# Patient Record
Sex: Female | Born: 1935 | Race: Black or African American | Hispanic: No | Smoking: Never smoker
Health system: Southern US, Community
[De-identification: ages and names within clinical notes are randomized; demographics above are authoritative.]

## PROBLEM LIST (undated history)

## (undated) DIAGNOSIS — R7309 Other abnormal glucose: Secondary | ICD-10-CM

## (undated) DIAGNOSIS — J449 Chronic obstructive pulmonary disease, unspecified: Secondary | ICD-10-CM

## (undated) DIAGNOSIS — J4489 Other specified chronic obstructive pulmonary disease: Secondary | ICD-10-CM

## (undated) DIAGNOSIS — D649 Anemia, unspecified: Secondary | ICD-10-CM

## (undated) DIAGNOSIS — F22 Delusional disorders: Secondary | ICD-10-CM

## (undated) DIAGNOSIS — J45909 Unspecified asthma, uncomplicated: Secondary | ICD-10-CM

## (undated) DIAGNOSIS — E559 Vitamin D deficiency, unspecified: Secondary | ICD-10-CM

## (undated) DIAGNOSIS — K219 Gastro-esophageal reflux disease without esophagitis: Secondary | ICD-10-CM

## (undated) DIAGNOSIS — I5020 Unspecified systolic (congestive) heart failure: Secondary | ICD-10-CM

## (undated) DIAGNOSIS — M545 Low back pain, unspecified: Secondary | ICD-10-CM

## (undated) DIAGNOSIS — I10 Essential (primary) hypertension: Secondary | ICD-10-CM

## (undated) DIAGNOSIS — R609 Edema, unspecified: Secondary | ICD-10-CM

## (undated) DIAGNOSIS — I872 Venous insufficiency (chronic) (peripheral): Secondary | ICD-10-CM

## (undated) DIAGNOSIS — F489 Nonpsychotic mental disorder, unspecified: Secondary | ICD-10-CM

## (undated) DIAGNOSIS — F329 Major depressive disorder, single episode, unspecified: Secondary | ICD-10-CM

## (undated) DIAGNOSIS — M1990 Unspecified osteoarthritis, unspecified site: Secondary | ICD-10-CM

## (undated) DIAGNOSIS — I890 Lymphedema, not elsewhere classified: Secondary | ICD-10-CM

## (undated) DIAGNOSIS — F419 Anxiety disorder, unspecified: Secondary | ICD-10-CM

## (undated) DIAGNOSIS — F32A Depression, unspecified: Secondary | ICD-10-CM

## (undated) HISTORY — DX: Chronic obstructive pulmonary disease, unspecified: J44.9

## (undated) HISTORY — DX: Unspecified systolic (congestive) heart failure: I50.20

## (undated) HISTORY — DX: Essential (primary) hypertension: I10

## (undated) HISTORY — DX: Unspecified osteoarthritis, unspecified site: M19.90

## (undated) HISTORY — DX: Unspecified asthma, uncomplicated: J45.909

## (undated) HISTORY — DX: Major depressive disorder, single episode, unspecified: F32.9

## (undated) HISTORY — PX: LUMBAR DISC SURGERY: SHX700

## (undated) HISTORY — DX: Delusional disorders: F22

## (undated) HISTORY — DX: Lymphedema, not elsewhere classified: I89.0

## (undated) HISTORY — DX: Depression, unspecified: F32.A

## (undated) HISTORY — DX: Other abnormal glucose: R73.09

## (undated) HISTORY — PX: CHOLECYSTECTOMY: SHX55

## (undated) HISTORY — DX: Venous insufficiency (chronic) (peripheral): I87.2

## (undated) HISTORY — PX: PARTIAL HYSTERECTOMY: SHX80

## (undated) HISTORY — DX: Anemia, unspecified: D64.9

## (undated) HISTORY — DX: Low back pain: M54.5

## (undated) HISTORY — DX: Nonpsychotic mental disorder, unspecified: F48.9

## (undated) HISTORY — PX: TONSILLECTOMY: SUR1361

## (undated) HISTORY — PX: APPENDECTOMY: SHX54

## (undated) HISTORY — DX: Gastro-esophageal reflux disease without esophagitis: K21.9

## (undated) HISTORY — DX: Low back pain, unspecified: M54.50

## (undated) HISTORY — DX: Other specified chronic obstructive pulmonary disease: J44.89

## (undated) HISTORY — DX: Vitamin D deficiency, unspecified: E55.9

## (undated) HISTORY — DX: Anxiety disorder, unspecified: F41.9

## (undated) HISTORY — PX: CERVICAL SPINE SURGERY: SHX589

## (undated) HISTORY — DX: Edema, unspecified: R60.9

## (undated) HISTORY — PX: HAND SURGERY: SHX662

---

## 1998-08-28 ENCOUNTER — Encounter: Payer: Self-pay | Admitting: *Deleted

## 1998-08-28 ENCOUNTER — Emergency Department (HOSPITAL_COMMUNITY): Admission: EM | Admit: 1998-08-28 | Discharge: 1998-08-28 | Payer: Self-pay | Admitting: Emergency Medicine

## 1999-06-21 ENCOUNTER — Encounter: Payer: Self-pay | Admitting: Emergency Medicine

## 1999-06-21 ENCOUNTER — Emergency Department (HOSPITAL_COMMUNITY): Admission: EM | Admit: 1999-06-21 | Discharge: 1999-06-21 | Payer: Self-pay | Admitting: Emergency Medicine

## 2001-02-05 ENCOUNTER — Ambulatory Visit (HOSPITAL_COMMUNITY): Admission: RE | Admit: 2001-02-05 | Discharge: 2001-02-05 | Payer: Self-pay | Admitting: Pulmonary Disease

## 2001-02-05 ENCOUNTER — Encounter: Payer: Self-pay | Admitting: Pulmonary Disease

## 2001-02-07 ENCOUNTER — Ambulatory Visit (HOSPITAL_COMMUNITY): Admission: RE | Admit: 2001-02-07 | Discharge: 2001-02-07 | Payer: Self-pay | Admitting: Pulmonary Disease

## 2001-02-07 ENCOUNTER — Encounter: Payer: Self-pay | Admitting: Pulmonary Disease

## 2003-03-13 ENCOUNTER — Ambulatory Visit (HOSPITAL_COMMUNITY): Admission: RE | Admit: 2003-03-13 | Discharge: 2003-03-13 | Payer: Self-pay | Admitting: Family Medicine

## 2003-03-13 ENCOUNTER — Encounter: Payer: Self-pay | Admitting: Family Medicine

## 2003-06-24 ENCOUNTER — Encounter (HOSPITAL_BASED_OUTPATIENT_CLINIC_OR_DEPARTMENT_OTHER): Payer: Self-pay | Admitting: General Surgery

## 2003-06-25 ENCOUNTER — Encounter (HOSPITAL_BASED_OUTPATIENT_CLINIC_OR_DEPARTMENT_OTHER): Payer: Self-pay | Admitting: General Surgery

## 2003-06-25 ENCOUNTER — Encounter (INDEPENDENT_AMBULATORY_CARE_PROVIDER_SITE_OTHER): Payer: Self-pay | Admitting: Specialist

## 2003-06-25 ENCOUNTER — Ambulatory Visit (HOSPITAL_COMMUNITY): Admission: RE | Admit: 2003-06-25 | Discharge: 2003-06-27 | Payer: Self-pay | Admitting: General Surgery

## 2003-10-31 DIAGNOSIS — R609 Edema, unspecified: Secondary | ICD-10-CM

## 2003-10-31 HISTORY — DX: Edema, unspecified: R60.9

## 2005-12-25 ENCOUNTER — Ambulatory Visit: Payer: Self-pay | Admitting: Pulmonary Disease

## 2007-01-15 ENCOUNTER — Ambulatory Visit: Payer: Self-pay | Admitting: Pulmonary Disease

## 2007-01-15 LAB — CONVERTED CEMR LAB
ALT: 30 units/L (ref 0–40)
AST: 33 units/L (ref 0–37)
Albumin: 3.8 g/dL (ref 3.5–5.2)
Alkaline Phosphatase: 86 units/L (ref 39–117)
BUN: 14 mg/dL (ref 6–23)
Basophils Absolute: 0 10*3/uL (ref 0.0–0.1)
Calcium: 9.6 mg/dL (ref 8.4–10.5)
Chloride: 102 meq/L (ref 96–112)
Creatinine, Ser: 1.1 mg/dL (ref 0.4–1.2)
Eosinophils Absolute: 0.4 10*3/uL (ref 0.0–0.6)
GFR calc non Af Amer: 52 mL/min
HCT: 35.7 % — ABNORMAL LOW (ref 36.0–46.0)
MCHC: 33.7 g/dL (ref 30.0–36.0)
Monocytes Relative: 10.3 % (ref 3.0–11.0)
Neutrophils Relative %: 50.3 % (ref 43.0–77.0)
Platelets: 272 10*3/uL (ref 150–400)
RBC: 4.26 M/uL (ref 3.87–5.11)
RDW: 13.9 % (ref 11.5–14.6)
Total Bilirubin: 0.5 mg/dL (ref 0.3–1.2)

## 2007-11-01 ENCOUNTER — Telehealth (INDEPENDENT_AMBULATORY_CARE_PROVIDER_SITE_OTHER): Payer: Self-pay | Admitting: *Deleted

## 2007-11-05 ENCOUNTER — Encounter: Payer: Self-pay | Admitting: Pulmonary Disease

## 2007-11-11 DIAGNOSIS — M545 Low back pain, unspecified: Secondary | ICD-10-CM | POA: Insufficient documentation

## 2007-11-11 DIAGNOSIS — N200 Calculus of kidney: Secondary | ICD-10-CM | POA: Insufficient documentation

## 2007-11-11 DIAGNOSIS — I10 Essential (primary) hypertension: Secondary | ICD-10-CM | POA: Insufficient documentation

## 2007-11-11 DIAGNOSIS — I872 Venous insufficiency (chronic) (peripheral): Secondary | ICD-10-CM | POA: Insufficient documentation

## 2007-11-11 HISTORY — DX: Calculus of kidney: N20.0

## 2007-11-21 ENCOUNTER — Ambulatory Visit: Payer: Self-pay | Admitting: Pulmonary Disease

## 2007-12-16 ENCOUNTER — Encounter: Payer: Self-pay | Admitting: Pulmonary Disease

## 2008-04-20 ENCOUNTER — Encounter: Payer: Self-pay | Admitting: Pulmonary Disease

## 2008-05-04 ENCOUNTER — Encounter: Payer: Self-pay | Admitting: Pulmonary Disease

## 2008-06-24 ENCOUNTER — Encounter: Payer: Self-pay | Admitting: Pulmonary Disease

## 2008-07-27 ENCOUNTER — Telehealth: Payer: Self-pay | Admitting: Pulmonary Disease

## 2008-07-29 ENCOUNTER — Ambulatory Visit: Payer: Self-pay | Admitting: Pulmonary Disease

## 2008-07-30 ENCOUNTER — Telehealth (INDEPENDENT_AMBULATORY_CARE_PROVIDER_SITE_OTHER): Payer: Self-pay | Admitting: *Deleted

## 2008-07-30 LAB — CONVERTED CEMR LAB
ALT: 27 units/L (ref 0–35)
Basophils Absolute: 0 10*3/uL (ref 0.0–0.1)
Basophils Relative: 0.2 % (ref 0.0–3.0)
Bilirubin, Direct: 0.1 mg/dL (ref 0.0–0.3)
CO2: 29 meq/L (ref 19–32)
Calcium: 9.8 mg/dL (ref 8.4–10.5)
Creatinine, Ser: 1.2 mg/dL (ref 0.4–1.2)
GFR calc Af Amer: 56 mL/min
Glucose, Bld: 88 mg/dL (ref 70–99)
HCT: 34 % — ABNORMAL LOW (ref 36.0–46.0)
Hemoglobin: 11.5 g/dL — ABNORMAL LOW (ref 12.0–15.0)
Hgb A1c MFr Bld: 5.8 % (ref 4.6–6.0)
Lymphocytes Relative: 33.5 % (ref 12.0–46.0)
MCHC: 33.8 g/dL (ref 30.0–36.0)
Monocytes Absolute: 0.5 10*3/uL (ref 0.1–1.0)
Monocytes Relative: 8.7 % (ref 3.0–12.0)
Neutro Abs: 3 10*3/uL (ref 1.4–7.7)
Pro B Natriuretic peptide (BNP): 123 pg/mL — ABNORMAL HIGH (ref 0.0–100.0)
RBC: 3.98 M/uL (ref 3.87–5.11)
RDW: 14.2 % (ref 11.5–14.6)
Sodium: 139 meq/L (ref 135–145)
TSH: 1.04 microintl units/mL (ref 0.35–5.50)
Total Protein: 8.1 g/dL (ref 6.0–8.3)

## 2008-08-02 DIAGNOSIS — J4489 Other specified chronic obstructive pulmonary disease: Secondary | ICD-10-CM | POA: Insufficient documentation

## 2008-08-02 DIAGNOSIS — J449 Chronic obstructive pulmonary disease, unspecified: Secondary | ICD-10-CM

## 2008-08-02 DIAGNOSIS — F22 Delusional disorders: Secondary | ICD-10-CM | POA: Insufficient documentation

## 2008-08-04 ENCOUNTER — Telehealth (INDEPENDENT_AMBULATORY_CARE_PROVIDER_SITE_OTHER): Payer: Self-pay | Admitting: *Deleted

## 2008-08-10 ENCOUNTER — Encounter: Payer: Self-pay | Admitting: Pulmonary Disease

## 2008-08-20 ENCOUNTER — Telehealth (INDEPENDENT_AMBULATORY_CARE_PROVIDER_SITE_OTHER): Payer: Self-pay | Admitting: *Deleted

## 2008-09-07 ENCOUNTER — Telehealth (INDEPENDENT_AMBULATORY_CARE_PROVIDER_SITE_OTHER): Payer: Self-pay | Admitting: *Deleted

## 2008-09-14 ENCOUNTER — Ambulatory Visit (HOSPITAL_COMMUNITY): Admission: RE | Admit: 2008-09-14 | Discharge: 2008-09-14 | Payer: Self-pay | Admitting: Pulmonary Disease

## 2008-09-14 ENCOUNTER — Encounter: Payer: Self-pay | Admitting: Pulmonary Disease

## 2008-09-14 ENCOUNTER — Ambulatory Visit: Payer: Self-pay | Admitting: *Deleted

## 2008-09-14 ENCOUNTER — Ambulatory Visit: Payer: Self-pay | Admitting: Pulmonary Disease

## 2008-09-16 ENCOUNTER — Encounter: Payer: Self-pay | Admitting: Pulmonary Disease

## 2008-10-28 ENCOUNTER — Encounter: Payer: Self-pay | Admitting: Pulmonary Disease

## 2009-01-01 ENCOUNTER — Ambulatory Visit: Payer: Self-pay | Admitting: Emergency Medicine

## 2009-01-01 ENCOUNTER — Inpatient Hospital Stay (HOSPITAL_COMMUNITY): Admission: EM | Admit: 2009-01-01 | Discharge: 2009-01-04 | Payer: Self-pay | Admitting: Emergency Medicine

## 2009-01-04 ENCOUNTER — Telehealth (INDEPENDENT_AMBULATORY_CARE_PROVIDER_SITE_OTHER): Payer: Self-pay | Admitting: *Deleted

## 2009-01-11 ENCOUNTER — Ambulatory Visit: Payer: Self-pay | Admitting: Pulmonary Disease

## 2009-01-11 DIAGNOSIS — R7309 Other abnormal glucose: Secondary | ICD-10-CM | POA: Insufficient documentation

## 2009-02-16 ENCOUNTER — Ambulatory Visit: Payer: Self-pay | Admitting: Pulmonary Disease

## 2009-02-18 ENCOUNTER — Telehealth (INDEPENDENT_AMBULATORY_CARE_PROVIDER_SITE_OTHER): Payer: Self-pay | Admitting: *Deleted

## 2009-03-16 ENCOUNTER — Ambulatory Visit: Payer: Self-pay | Admitting: Vascular Surgery

## 2009-09-24 ENCOUNTER — Telehealth (INDEPENDENT_AMBULATORY_CARE_PROVIDER_SITE_OTHER): Payer: Self-pay | Admitting: *Deleted

## 2009-09-30 ENCOUNTER — Ambulatory Visit: Payer: Self-pay | Admitting: Pulmonary Disease

## 2009-09-30 DIAGNOSIS — L259 Unspecified contact dermatitis, unspecified cause: Secondary | ICD-10-CM | POA: Insufficient documentation

## 2010-01-28 ENCOUNTER — Ambulatory Visit: Payer: Self-pay | Admitting: Pulmonary Disease

## 2010-01-29 DIAGNOSIS — E559 Vitamin D deficiency, unspecified: Secondary | ICD-10-CM | POA: Insufficient documentation

## 2010-01-29 DIAGNOSIS — M7020 Olecranon bursitis, unspecified elbow: Secondary | ICD-10-CM | POA: Insufficient documentation

## 2010-01-31 LAB — CONVERTED CEMR LAB
ALT: 21 units/L (ref 0–35)
AST: 33 units/L (ref 0–37)
Albumin: 3.6 g/dL (ref 3.5–5.2)
Alkaline Phosphatase: 86 units/L (ref 39–117)
Basophils Absolute: 0 10*3/uL (ref 0.0–0.1)
Bilirubin, Direct: 0.1 mg/dL (ref 0.0–0.3)
CO2: 24 meq/L (ref 19–32)
Chloride: 102 meq/L (ref 96–112)
Eosinophils Absolute: 0.3 10*3/uL (ref 0.0–0.7)
Folate: 17.7 ng/mL
Glucose, Bld: 92 mg/dL (ref 70–99)
HDL: 57.9 mg/dL (ref >39.00)
Hemoglobin: 11.2 g/dL — ABNORMAL LOW (ref 12.0–15.0)
Iron: 37 ug/dL — ABNORMAL LOW (ref 42–145)
Lymphocytes Relative: 25.3 % (ref 12.0–46.0)
MCHC: 33.1 g/dL (ref 30.0–36.0)
Magnesium: 2.1 mg/dL (ref 1.5–2.5)
Monocytes Relative: 10.3 % (ref 3.0–12.0)
Neutro Abs: 3.5 10*3/uL (ref 1.4–7.7)
Neutrophils Relative %: 58.5 % (ref 43.0–77.0)
Platelets: 359 10*3/uL (ref 150.0–400.0)
Potassium: 3.9 meq/L (ref 3.5–5.1)
RDW: 14.9 % — ABNORMAL HIGH (ref 11.5–14.6)
Sodium: 135 meq/L (ref 135–145)
TSH: 0.87 microintl units/mL (ref 0.35–5.50)
Total CHOL/HDL Ratio: 3
Total Protein: 8.3 g/dL (ref 6.0–8.3)

## 2010-02-02 ENCOUNTER — Telehealth: Payer: Self-pay | Admitting: Pulmonary Disease

## 2010-03-07 ENCOUNTER — Ambulatory Visit: Payer: Self-pay | Admitting: Pulmonary Disease

## 2010-03-07 LAB — CONVERTED CEMR LAB
Fecal Occult Blood: NEGATIVE
OCCULT 1: NEGATIVE
OCCULT 2: NEGATIVE
OCCULT 3: NEGATIVE
OCCULT 3: NEGATIVE
OCCULT 4: NEGATIVE
OCCULT 5: NEGATIVE

## 2010-11-22 ENCOUNTER — Ambulatory Visit: Admit: 2010-11-22 | Payer: Self-pay | Admitting: Pulmonary Disease

## 2010-11-22 ENCOUNTER — Telehealth (INDEPENDENT_AMBULATORY_CARE_PROVIDER_SITE_OTHER): Payer: Self-pay | Admitting: *Deleted
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
## 2010-12-05 ENCOUNTER — Ambulatory Visit: Payer: Self-pay | Admitting: Pulmonary Disease

## 2010-12-08 ENCOUNTER — Telehealth: Payer: Self-pay | Admitting: Pulmonary Disease

## 2010-12-21 NOTE — Progress Notes (Signed)
Summary: Hydrocodone-Acetaminophin refill request  Phone Note Refill Request Message from:  Fax from Pharmacy on December 08, 2010 11:30 AM  Refills Requested: Medication #1:  HYDROCODONE-ACETAMINOPHEN 5-500 MG  TABS take 1 tab by mouth every 6H as needed for back pain... not to exceed 3 per day.   Dosage confirmed as above?Dosage Confirmed   Brand Name Necessary? No   Supply Requested: 1 month   Last Refilled: 11/12/2010   Notes: Dr.Destini Cambre patient [facility] [address]  (p)  [phone]   Method Requested: Telephone to Pharmacy Next Appointment Scheduled: no pending appointments Initial call taken by: Michel Bickers CMA,  December 08, 2010 11:32 AM  Follow-up for Phone Call        Pls advise if okay to fill.  Michel Bickers CMA  December 08, 2010 11:32 AM    Prescriptions: HYDROCODONE-ACETAMINOPHEN 5-500 MG  TABS (HYDROCODONE-ACETAMINOPHEN) take 1 tab by mouth every 6H as needed for back pain... not to exceed 3 per day.  #90 x 1   Entered by:   Randell Loop CMA   Authorized by:   Michele Mcalpine MD   Signed by:   Randell Loop CMA on 12/09/2010   Method used:   Telephoned to ...       [facility] [address]* (retail)       [address]       [place], [place]  [address]       Ph: [phone]       Fax: [phone]   RxID:   [id number]

## 2011-01-21 ENCOUNTER — Other Ambulatory Visit: Payer: Self-pay | Admitting: Pulmonary Disease

## 2011-01-31 MED ORDER — HYDROCODONE-ACETAMINOPHEN 5-500 MG PO TABS: ORAL_TABLET | ORAL | Status: DC

## 2011-02-09 LAB — THEOPHYLLINE LEVEL: Theophylline Lvl: 20.4 ug/mL — ABNORMAL HIGH (ref 10.0–20.0)

## 2011-02-09 LAB — URINALYSIS, ROUTINE W REFLEX MICROSCOPIC
Bilirubin Urine: NEGATIVE
Glucose, UA: NEGATIVE mg/dL
Hgb urine dipstick: NEGATIVE
Ketones, ur: NEGATIVE mg/dL
Nitrite: NEGATIVE
Protein, ur: NEGATIVE mg/dL
Specific Gravity, Urine: 1.009 (ref 1.005–1.030)
Urobilinogen, UA: 1 mg/dL (ref 0.0–1.0)
pH: 5.5 (ref 5.0–8.0)

## 2011-02-09 LAB — DIFFERENTIAL
Basophils Absolute: 0 K/uL (ref 0.0–0.1)
Basophils Relative: 0 % (ref 0–1)
Eosinophils Absolute: 0.4 K/uL (ref 0.0–0.7)
Eosinophils Relative: 4 % (ref 0–5)
Lymphocytes Relative: 20 % (ref 12–46)
Lymphs Abs: 2 10*3/uL (ref 0.7–4.0)
Monocytes Absolute: 0.8 10*3/uL (ref 0.1–1.0)
Monocytes Relative: 7 % (ref 3–12)
Neutro Abs: 7.2 K/uL (ref 1.7–7.7)
Neutrophils Relative %: 69 % (ref 43–77)

## 2011-02-09 LAB — CULTURE, BLOOD (ROUTINE X 2)
Culture: NO GROWTH
Culture: NO GROWTH

## 2011-02-09 LAB — COMPREHENSIVE METABOLIC PANEL
AST: 32 U/L (ref 0–37)
Albumin: 3.6 g/dL (ref 3.5–5.2)
Calcium: 9.4 mg/dL (ref 8.4–10.5)
Creatinine, Ser: 1.28 mg/dL — ABNORMAL HIGH (ref 0.4–1.2)
GFR calc Af Amer: 49 mL/min — ABNORMAL LOW (ref >60)
GFR calc non Af Amer: 41 mL/min — ABNORMAL LOW (ref >60)
Total Protein: 8.7 g/dL — ABNORMAL HIGH (ref 6.0–8.3)

## 2011-02-09 LAB — COMPREHENSIVE METABOLIC PANEL WITH GFR
ALT: 20 U/L (ref 0–35)
Alkaline Phosphatase: 73 U/L (ref 39–117)
BUN: 14 mg/dL (ref 6–23)
CO2: 24 meq/L (ref 19–32)
Chloride: 98 meq/L (ref 96–112)
Glucose, Bld: 101 mg/dL — ABNORMAL HIGH (ref 70–99)
Potassium: 3.7 meq/L (ref 3.5–5.1)
Sodium: 131 meq/L — ABNORMAL LOW (ref 135–145)
Total Bilirubin: 1.1 mg/dL (ref 0.3–1.2)

## 2011-02-09 LAB — WOUND CULTURE

## 2011-02-09 LAB — URINE MICROSCOPIC-ADD ON

## 2011-02-09 LAB — CBC
HCT: 34.1 % — ABNORMAL LOW (ref 36.0–46.0)
Hemoglobin: 11.2 g/dL — ABNORMAL LOW (ref 12.0–15.0)
MCHC: 33 g/dL (ref 30.0–36.0)
MCV: 84 fL (ref 78.0–100.0)
MCV: 84.1 fL (ref 78.0–100.0)
Platelets: 263 10*3/uL (ref 150–400)
RBC: 3.51 MIL/uL — ABNORMAL LOW (ref 3.87–5.11)
RBC: 4.05 MIL/uL (ref 3.87–5.11)
RDW: 14.9 % (ref 11.5–15.5)
WBC: 10.4 K/uL (ref 4.0–10.5)
WBC: 7 10*3/uL (ref 4.0–10.5)

## 2011-02-15 ENCOUNTER — Other Ambulatory Visit: Payer: Self-pay | Admitting: Pulmonary Disease

## 2011-03-14 ENCOUNTER — Other Ambulatory Visit: Payer: Self-pay | Admitting: *Deleted

## 2011-03-14 MED ORDER — BECLOMETHASONE DIPROPIONATE 80 MCG/ACT IN AERS: 2.0000 | INHALATION_SPRAY | Freq: Two times a day (BID) | RESPIRATORY_TRACT | Status: DC

## 2011-03-14 MED ORDER — FLUOCINONIDE 0.05 % EX CREA: TOPICAL_CREAM | CUTANEOUS | Status: DC

## 2011-03-14 MED ORDER — ALPRAZOLAM 0.5 MG PO TABS: 0.5000 mg | ORAL_TABLET | Freq: Three times a day (TID) | ORAL | Status: DC | PRN

## 2011-03-14 NOTE — Telephone Encounter (Signed)
Pt is scheduled for 04/26/2011 @ 10 am for physical and is aware she will need to keep this appt for further refills.

## 2011-03-14 NOTE — Telephone Encounter (Signed)
Pt last seen on 01/28/2010. There are no pending appointments. Refill request received from the pharmacy for:  Xanax 0.5 mg, # 90, 1/2 to 1 TID, Qvar 80 mcg, 1 inhaler, 2 puffs BID and Fluocinonide Cream.0.05%, Apply to legs twice daily. Pls advise if okay to send refills.

## 2011-03-14 NOTE — Telephone Encounter (Signed)
Ok to refill x 1 but pt will need follow up for further refills.  thanks
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
## 2011-03-14 NOTE — Consult Note (Signed)
[name], [name]              ACCOUNT NO.:  [id number]   MEDICAL RECORD NO.:  [id number]          PATIENT TYPE:  INP   LOCATION:  1302                         FACILITY:  [facility]   PHYSICIAN:  Erasmo Leventhal, M.D.DATE OF BIRTH:  [birth year]   DATE OF CONSULTATION:  01/02/2009  DATE OF DISCHARGE:                                 CONSULTATION   HISTORY OF PRESENT ILLNESS:  Ms. Bundick is a very pleasant 77 year old  female with approximately a 2-day history of left elbow pain and  swelling and heat.  She went to Dr. Jodelle Green office yesterday and was  found to have a septic left arm bursitis.  She was sent to the ED and  admitted to Dr. Kriste Basque by Dr. Sung Amabile.  He called me last night for  consideration of incision and drainage.  The patient has been admitted  overnight.  She states the elbow is feeling better.  She has been  afebrile.  She notes that the erythema has decreased and the pain has  decreased.   ALLERGIES:  None.  She does have an NSAID sensitivity.   MEDICATIONS AT HOME:  Nebulized beta dilators, Theophylline, Demodex,  Qvar, p.r.n. Vicodin, Xanax.   PAST MEDICAL HISTORY:  1. Asthma.  2. Low back pain.  3. Osteoarthritis.  4. Chronic lower extremity edema.  5. Anxiety.   SOCIAL HISTORY:  Married.  Lives with her husband.  No tobacco.  No  alcohol.   REVIEW OF SYSTEMS:  Negative except as above.   PHYSICAL EXAMINATION:  GENERAL:  Very pleasant female in no acute  distress.  Oriented to person, place, time, and circumstance.  EXTREMITIES:  Left elbow is examined.  She has no palpable effusion.  She has obvious erythema and swelling about the olecranon bursa.  Overlying skin is intact.  There is 4 cm of stranding erythema.  No  palpable lymphadenopathy.  Elbow range of motion is full.  Elbow joint  itself is negative.  Distal neurovascular examination is intact.  No  compartments are soft.   LABORATORY DATA:  Plain x-rays show olecranon traction spur, soft  tissue  swelling, mild degenerative change, otherwise negative.   IMPRESSION:  Left elbow septic olecranon bursitis.  I have recommended  to the patient I and D and she wishes to proceed.  With the assistance  of Kim, her nurse, throughout procedure, sterile prep and Betadine.  She  was anesthetized with 1% Lidocaine incision.  We removed approximately 5  ml of purulent material.  Gram's stain and culture were sent.  This was  thoroughly opened with Hemostats.  All pockets opened and copiously  irrigated with saline and Penrose drain and sterile dressings applied.   IMPRESSION:  Left elbow septic olecranon bursitis.   RECOMMENDATIONS:  I and D as noted above.  Follow Gram's stain and  culture.  This is probably a Staphylococcus.  Would recommend IV  antibiotics today.  Recheck tomorrow and advance Penrose.  If she  continues to do well from my standpoint it would be safe to discharge to  home on p.o. antibiotics based on culture or empiric  Augmentin versus  Septra DS.  All questions were encouraged to the patient in detail.  I  appreciate this consult.           ______________________________  Erasmo Leventhal, M.D.     RAC/MEDQ  D:  01/02/2009  T:  01/02/2009  Job:  [id number]   cc:   Oley Balm. Sung Amabile, MD  [address]  [place]  [place] [address]
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
## 2011-03-16 ENCOUNTER — Other Ambulatory Visit: Payer: Self-pay | Admitting: *Deleted

## 2011-03-16 MED ORDER — TORSEMIDE 20 MG PO TABS: ORAL_TABLET | ORAL | Status: DC
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
## 2011-03-17 NOTE — Op Note (Signed)
[name], [name]                        ACCOUNT NO.:  [id number]   MEDICAL RECORD NO.:  [id number]                   PATIENT TYPE:  OIB   LOCATION:  5727                                 FACILITY:  [facility]   PHYSICIAN:  Leonie Man, M.D.                DATE OF BIRTH:  October 07, 1932   DATE OF PROCEDURE:  06/25/2003  DATE OF DISCHARGE:                                 OPERATIVE REPORT   PREOPERATIVE DIAGNOSIS:  Chronic calculous cholecystitis.   POSTOPERATIVE DIAGNOSIS:  Chronic calculous cholecystitis.   OPERATION PERFORMED:  Laparoscopic cholecystectomy with intraoperative  cholangiogram.   SURGEON:  Leonie Man, M.D.   ASSISTANT:  Nurse.   ANESTHESIA:  General.   INDICATIONS FOR PROCEDURE:  The patient is a 77 year old presenting with  severe epigastric and right upper quadrant pain associated with nausea and  emesis increasing pain over the last several months.  Mild amylase  elevations but without any liver function abnormality.  Abdominal ultrasound  demonstrates cholelithiasis.  She comes to the operating room now for  laparoscopic cholecystectomy after risks and potential benefits of  laparoscopic surgery have been fully discussed, all questions answered and  consent obtained.   DESCRIPTION OF PROCEDURE:  Following the induction of satisfactory general  anesthesia, the patient positioned supinely, the abdomen was prepped and  draped to be included in a sterile operative field.  Open laparoscopy was  created at the umbilicus with insufflation of the peritoneal cavity to  pressure.  Camera was inserted and visual exploration carried out.  Pelvic organs were not visualized because of previous ectopic surgery with  adhesions.  None of the small or large intestine viewed appeared to be  normal.  Liver edges were sharp, liver surfaces were smooth, anterior  gastric wall was noted to be normal.  The duodenum sweep was densely  tethered to the ampulla of the  gallbladder and this had to be taken down.   Under direct vision epigastric and lateral ports were placed.  The  gallbladder was grasped and retracted cephalad as multiple adhesions to the  gallbladder were taken down and dissection carried down to the ampulla where  the ampulla was grasped and the cystic artery and cystic duct dissected  free.  The cystic artery was traced to its entry to the gallbladder wall, it  was doubly clipped and transected.  The cystic duct was traced to the  gallbladder cystic duct junction, also down to the gallbladder common duct  junction.  It was clamped proximally and opened.  A cystic duct  cholangiogram was carried out by injecting one half strength Hypaque dye  into the cystic duct which was accessed through a Reddick catheter.  The  resulting cholangiogram showed prompt flow of contrast and through the  duodenum with no filling defects and normal biliary caliber.  The  cholangiocatheter was removed and the cystic duct was doubly clipped and  completely transected.  The gallbladder was then dissected free from the  liver bed using electrocautery and maintaining hemostasis throughout the  course of dissection.  At the end of this dissection, the gallbladder was  removed and placed on top of the liver.  The right upper quadrant thoroughly  irrigated and aspirated.  Additional bleeding points within the liver bed  were treated with electrocautery.  The gallbladder was then retrieved with  an Endo pouch which was passed into the abdomen and the gallbladder was  deposited into it.  The gallbladder was then retrieved through the umbilical  port without difficulty.  Sponge, instrument and sharp counts were then  verified.  The pneumoperitoneum was deflated.  Trocars were removed under  direct vision and the wounds closed in layers as follows.  Umbilical wound  in two layers with 0 Vicryl and 4-0 Monocryl.  Epigastric and lateral flank  wounds were closed with  4-0 Monocryl sutures.  All wounds were reinforced  with Steri-Strips.  Sterile dressings were applied.  Anesthetic was reversed  and the patient removed from the operating room to the recovery room in  stable condition, having tolerated the procedure well.                                                Leonie Man, M.D.    PB/MEDQ  D:  06/25/2003  T:  06/25/2003  Job:  [id number]

## 2011-04-24 ENCOUNTER — Encounter: Payer: Self-pay | Admitting: Pulmonary Disease

## 2011-04-26 ENCOUNTER — Telehealth: Payer: Self-pay | Admitting: Pulmonary Disease

## 2011-04-26 ENCOUNTER — Encounter: Payer: Self-pay | Admitting: Pulmonary Disease

## 2011-04-26 MED ORDER — ALBUTEROL SULFATE HFA 108 (90 BASE) MCG/ACT IN AERS: 2.0000 | INHALATION_SPRAY | RESPIRATORY_TRACT | Status: DC | PRN

## 2011-04-26 MED ORDER — THEOPHYLLINE 200 MG PO TB12: 200.0000 mg | ORAL_TABLET | Freq: Two times a day (BID) | ORAL | Status: DC

## 2011-04-26 MED ORDER — POTASSIUM CHLORIDE 20 MEQ PO PACK: 20.0000 meq | PACK | Freq: Two times a day (BID) | ORAL | Status: DC

## 2011-04-26 MED ORDER — TORSEMIDE 20 MG PO TABS: ORAL_TABLET | ORAL | Status: DC

## 2011-04-26 MED ORDER — BECLOMETHASONE DIPROPIONATE 80 MCG/ACT IN AERS: 2.0000 | INHALATION_SPRAY | Freq: Two times a day (BID) | RESPIRATORY_TRACT | Status: DC

## 2011-04-26 MED ORDER — HYDROXYZINE HCL 25 MG PO TABS: 25.0000 mg | ORAL_TABLET | ORAL | Status: DC | PRN

## 2011-04-26 NOTE — Telephone Encounter (Signed)
NOTE: [facility] [employer]- [facility]

## 2011-04-26 NOTE — Telephone Encounter (Signed)
Spoke with pt and notified that meds were refilled and to keep appt with SN for future refills. She verbalized understanding and states that she is needing appt this wk for "shot for asthma"- she states has been having more frequent flareups and wants depo. Appt with TP sched for Friday 6/29 at 3:14 pm.

## 2011-04-26 NOTE — Telephone Encounter (Signed)
meds have been sent to the pharmacy to last another month for pt since she missed her appt this am and had to reschedule.  Attempted to call pt to make her aware but no answer and no machine.  Will try back later.

## 2011-04-28 ENCOUNTER — Encounter: Payer: Self-pay | Admitting: Adult Health

## 2011-04-28 ENCOUNTER — Ambulatory Visit (INDEPENDENT_AMBULATORY_CARE_PROVIDER_SITE_OTHER): Payer: Self-pay | Admitting: Adult Health

## 2011-04-28 ENCOUNTER — Other Ambulatory Visit (INDEPENDENT_AMBULATORY_CARE_PROVIDER_SITE_OTHER): Payer: Self-pay

## 2011-04-28 DIAGNOSIS — R7309 Other abnormal glucose: Secondary | ICD-10-CM

## 2011-04-28 DIAGNOSIS — J4489 Other specified chronic obstructive pulmonary disease: Secondary | ICD-10-CM

## 2011-04-28 DIAGNOSIS — J449 Chronic obstructive pulmonary disease, unspecified: Secondary | ICD-10-CM

## 2011-04-28 DIAGNOSIS — D649 Anemia, unspecified: Secondary | ICD-10-CM

## 2011-04-28 DIAGNOSIS — I10 Essential (primary) hypertension: Secondary | ICD-10-CM

## 2011-04-28 LAB — BASIC METABOLIC PANEL
BUN: 16 mg/dL (ref 6–23)
Calcium: 9.8 mg/dL (ref 8.4–10.5)
GFR: 60.64 mL/min (ref >60.00)
Glucose, Bld: 76 mg/dL (ref 70–99)
Potassium: 3.5 mEq/L (ref 3.5–5.1)
Sodium: 137 mEq/L (ref 135–145)

## 2011-04-28 LAB — CBC WITH DIFFERENTIAL/PLATELET
Eosinophils Absolute: 0.5 10*3/uL (ref 0.0–0.7)
HCT: 33.2 % — ABNORMAL LOW (ref 36.0–46.0)
Lymphs Abs: 1.3 10*3/uL (ref 0.7–4.0)
MCHC: 33.3 g/dL (ref 30.0–36.0)
MCV: 84.5 fl (ref 78.0–100.0)
Monocytes Absolute: 0.5 10*3/uL (ref 0.1–1.0)
Neutrophils Relative %: 60.5 % (ref 43.0–77.0)
Platelets: 283 10*3/uL (ref 150.0–400.0)

## 2011-04-28 MED ORDER — CLOTRIMAZOLE-BETAMETHASONE 1-0.05 % EX CREA: TOPICAL_CREAM | CUTANEOUS | Status: DC

## 2011-04-28 MED ORDER — ALPRAZOLAM 0.5 MG PO TABS: 0.5000 mg | ORAL_TABLET | Freq: Three times a day (TID) | ORAL | Status: DC | PRN

## 2011-04-28 MED ORDER — AZITHROMYCIN 250 MG PO TABS: ORAL_TABLET | ORAL | Status: AC

## 2011-04-28 MED ORDER — ALBUTEROL SULFATE (2.5 MG/3ML) 0.083% IN NEBU
2.5000 mg | INHALATION_SOLUTION | Freq: Once | RESPIRATORY_TRACT | Status: AC
  Administered 2011-04-28: 2.5 mg via RESPIRATORY_TRACT

## 2011-04-28 MED ORDER — FLUOCINONIDE 0.05 % EX CREA: TOPICAL_CREAM | CUTANEOUS | Status: DC

## 2011-04-28 NOTE — Patient Instructions (Addendum)
Zpack take as directed.  Mucinex DM Twice daily  As needed  Cough/congestion  Fluids and rest  Please contact office for sooner follow up if symptoms do not improve or worsen or seek emergency care  follow up Dr. Kriste Basque  In 1 month as planned and As needed
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
## 2011-05-01 LAB — TSH: TSH: 0.65 u[IU]/mL (ref 0.35–5.50)

## 2011-05-02 NOTE — Assessment & Plan Note (Addendum)
Controlled on rx ,  Low salt diet  Labs pending.

## 2011-05-02 NOTE — Assessment & Plan Note (Signed)
Flare   Plan Zpack take as directed.  Mucinex DM Twice daily  As needed  Cough/congestion  Fluids and rest  Please contact office for sooner follow up if symptoms do not improve or worsen or seek emergency care

## 2011-05-02 NOTE — Assessment & Plan Note (Signed)
Labs today

## 2011-05-26 ENCOUNTER — Encounter: Payer: Self-pay | Admitting: Pulmonary Disease

## 2011-05-26 ENCOUNTER — Ambulatory Visit (INDEPENDENT_AMBULATORY_CARE_PROVIDER_SITE_OTHER)
Admission: RE | Admit: 2011-05-26 | Discharge: 2011-05-26 | Disposition: A | Discharge status: Home / Self Care | Payer: Medicare Other | Source: Ambulatory Visit | Attending: Pulmonary Disease | Admitting: Pulmonary Disease

## 2011-05-26 ENCOUNTER — Ambulatory Visit (INDEPENDENT_AMBULATORY_CARE_PROVIDER_SITE_OTHER): Payer: Medicare Other | Admitting: Pulmonary Disease

## 2011-05-26 ENCOUNTER — Other Ambulatory Visit (INDEPENDENT_AMBULATORY_CARE_PROVIDER_SITE_OTHER): Payer: 59

## 2011-05-26 DIAGNOSIS — M545 Low back pain, unspecified: Secondary | ICD-10-CM

## 2011-05-26 DIAGNOSIS — I10 Essential (primary) hypertension: Secondary | ICD-10-CM

## 2011-05-26 DIAGNOSIS — R609 Edema, unspecified: Secondary | ICD-10-CM

## 2011-05-26 DIAGNOSIS — L259 Unspecified contact dermatitis, unspecified cause: Secondary | ICD-10-CM

## 2011-05-26 DIAGNOSIS — D649 Anemia, unspecified: Secondary | ICD-10-CM

## 2011-05-26 DIAGNOSIS — J4489 Other specified chronic obstructive pulmonary disease: Secondary | ICD-10-CM

## 2011-05-26 DIAGNOSIS — J449 Chronic obstructive pulmonary disease, unspecified: Secondary | ICD-10-CM

## 2011-05-26 DIAGNOSIS — E559 Vitamin D deficiency, unspecified: Secondary | ICD-10-CM

## 2011-05-26 DIAGNOSIS — I872 Venous insufficiency (chronic) (peripheral): Secondary | ICD-10-CM

## 2011-05-26 DIAGNOSIS — F489 Nonpsychotic mental disorder, unspecified: Secondary | ICD-10-CM

## 2011-05-26 LAB — URINALYSIS
Hgb urine dipstick: NEGATIVE
Nitrite: NEGATIVE
Specific Gravity, Urine: 1.015 (ref 1.000–1.030)
Total Protein, Urine: NEGATIVE
pH: 6 (ref 5.0–8.0)

## 2011-05-26 LAB — CBC WITH DIFFERENTIAL/PLATELET
Basophils Relative: 0.4 % (ref 0.0–3.0)
Eosinophils Relative: 5.3 % — ABNORMAL HIGH (ref 0.0–5.0)
HCT: 32.5 % — ABNORMAL LOW (ref 36.0–46.0)
Hemoglobin: 10.9 g/dL — ABNORMAL LOW (ref 12.0–15.0)
Lymphs Abs: 0.9 10*3/uL (ref 0.7–4.0)
MCHC: 33.4 g/dL (ref 30.0–36.0)
MCV: 84.1 fl (ref 78.0–100.0)
Monocytes Absolute: 0.4 10*3/uL (ref 0.1–1.0)
Neutro Abs: 3.5 10*3/uL (ref 1.4–7.7)
RBC: 3.87 Mil/uL (ref 3.87–5.11)
WBC: 5.2 10*3/uL (ref 4.5–10.5)

## 2011-05-26 LAB — BASIC METABOLIC PANEL
BUN: 29 mg/dL — ABNORMAL HIGH (ref 6–23)
Creatinine, Ser: 1.4 mg/dL — ABNORMAL HIGH (ref 0.4–1.2)
GFR: 47.25 mL/min — ABNORMAL LOW (ref >60.00)

## 2011-05-26 LAB — HEPATIC FUNCTION PANEL
Bilirubin, Direct: 0.1 mg/dL (ref 0.0–0.3)
Total Bilirubin: 0.5 mg/dL (ref 0.3–1.2)

## 2011-05-26 LAB — IBC PANEL: Transferrin: 242.8 mg/dL (ref 212.0–360.0)

## 2011-05-26 MED ORDER — FLUTICASONE-SALMETEROL 100-50 MCG/DOSE IN AEPB: 1.0000 | INHALATION_SPRAY | Freq: Two times a day (BID) | RESPIRATORY_TRACT | Status: DC
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
## 2011-05-26 NOTE — Patient Instructions (Signed)
Today we updated your med list in EPIC...    We stopped the Qvar & Theodur, & replaced them w/ the newer ADVAIR- one inhalation twice daily...  Today we did your follow up CXR & blood work...    Please call the [facility] in a few days for your results...    Dial [id number] & when prompted enter your patient number followed by the # symbol...    Your patient number is:   [id number]#  If you want to pursue further evaluation, we will have to refer you to the Psychiatrists as they are the main doctors that can help you w/ your dilemma> please let us know if you want to see the specialists for further evaluation...  Call for any questions...  Let's plan a follow up visit in 6 months.Marland KitchenMarland Kitchen

## 2011-05-29 ENCOUNTER — Encounter: Payer: Self-pay | Admitting: Pulmonary Disease

## 2011-05-30 LAB — PROTEIN ELECTROPHORESIS, SERUM, WITH REFLEX
Albumin ELP: 42.1 % — ABNORMAL LOW (ref 55.8–66.1)
Alpha-2-Globulin: 9.3 % (ref 7.1–11.8)
M-Spike, %: 1.12 g/dL
Total Protein, Serum Electrophoresis: 9.6 g/dL — ABNORMAL HIGH (ref 6.0–8.3)

## 2011-05-31 LAB — IGG, IGA, IGM: IgA: 570 mg/dL — ABNORMAL HIGH (ref 69–380)

## 2011-06-06 ENCOUNTER — Other Ambulatory Visit: Payer: Self-pay | Admitting: *Deleted

## 2011-06-06 MED ORDER — VITAMIN D (ERGOCALCIFEROL) 1.25 MG (50000 UNIT) PO CAPS: 50000.0000 [IU] | ORAL_CAPSULE | ORAL | Status: DC

## 2011-06-06 MED ORDER — ALPRAZOLAM 0.5 MG PO TABS: 0.5000 mg | ORAL_TABLET | Freq: Three times a day (TID) | ORAL | Status: DC | PRN

## 2011-06-14 ENCOUNTER — Other Ambulatory Visit: Payer: Self-pay | Admitting: *Deleted

## 2011-06-14 MED ORDER — ALBUTEROL SULFATE HFA 108 (90 BASE) MCG/ACT IN AERS: 2.0000 | INHALATION_SPRAY | RESPIRATORY_TRACT | Status: DC | PRN

## 2011-07-04 ENCOUNTER — Other Ambulatory Visit: Payer: Self-pay | Admitting: Pulmonary Disease

## 2011-07-04 MED ORDER — THEOPHYLLINE 200 MG PO TB12: 200.0000 mg | ORAL_TABLET | Freq: Two times a day (BID) | ORAL | Status: DC

## 2011-07-04 NOTE — Telephone Encounter (Signed)
Also requesting Qvar 80 mcg inhaler. Pls advise.

## 2011-07-04 NOTE — Telephone Encounter (Signed)
Per SN---ok to refill the theophylline.  This has been sent to the pharmacy

## 2011-07-04 NOTE — Telephone Encounter (Signed)
Refill request faxed from the pharmacy for Theophylline 200 mg, # 60, 1 by mouth twice daily.  We last sent refills in 01/2010. This medication is not currently on pt's medication list. Is it okay to send refills? Pls advise.

## 2011-07-10 ENCOUNTER — Telehealth: Payer: Self-pay | Admitting: Pulmonary Disease

## 2011-07-10 MED ORDER — TORSEMIDE 20 MG PO TABS: ORAL_TABLET | ORAL | Status: DC

## 2011-07-10 NOTE — Telephone Encounter (Signed)
Spoke with pt and notified rx for demadex was sent to pharm.

## 2011-07-12 ENCOUNTER — Telehealth: Payer: Self-pay | Admitting: Pulmonary Disease

## 2011-07-12 NOTE — Telephone Encounter (Signed)
Pt's medication regimen was changed at the last OV on7/27/2012. Qvar is no longer on pt's medication list. Pls clarify if pt is to continue on Qvar as well as Advair.

## 2011-07-12 NOTE — Telephone Encounter (Signed)
Per SN- the advair takes the qvars place. Called pharm and had to LMOVM advising them of this. I called the pt to advise as well but number d/c'ed.

## 2011-08-23 ENCOUNTER — Other Ambulatory Visit: Payer: Self-pay | Admitting: *Deleted

## 2011-08-23 MED ORDER — HYDROXYZINE HCL 25 MG PO TABS: 25.0000 mg | ORAL_TABLET | ORAL | Status: DC | PRN

## 2011-09-01 ENCOUNTER — Other Ambulatory Visit: Payer: Self-pay | Admitting: Pulmonary Disease

## 2011-09-01 MED ORDER — POTASSIUM CHLORIDE 20 MEQ PO PACK: 20.0000 meq | PACK | Freq: Two times a day (BID) | ORAL | Status: DC

## 2011-10-10 ENCOUNTER — Other Ambulatory Visit: Payer: Self-pay | Admitting: *Deleted

## 2011-10-10 MED ORDER — HYDROCODONE-ACETAMINOPHEN 5-500 MG PO TABS: ORAL_TABLET | ORAL | Status: DC

## 2011-11-28 ENCOUNTER — Ambulatory Visit: Payer: 59 | Admitting: Pulmonary Disease

## 2012-01-10 ENCOUNTER — Ambulatory Visit: Payer: 59 | Admitting: Pulmonary Disease

## 2012-01-10 ENCOUNTER — Telehealth: Payer: Self-pay | Admitting: Pulmonary Disease

## 2012-01-10 NOTE — Telephone Encounter (Signed)
Pt c/o nausea and vomiting since last night. Diarrhea and severe stomach cramping too. She would like recs from SN. She is not able to keep anything down at this point. Low grade fever this morning. Pls advise. Allergies  Allergen Reactions  . Aspirin     REACTION: upset stomach  . Codeine     REACTION: nausea

## 2012-01-10 NOTE — Telephone Encounter (Signed)
Per SN pt needs to go to the ED to be evaluated  i spoke with pt and and is aware of SN recs. She voiced her understanding and had no quesitons

## 2012-01-12 ENCOUNTER — Other Ambulatory Visit: Payer: Self-pay | Admitting: *Deleted

## 2012-01-12 MED ORDER — CLOTRIMAZOLE-BETAMETHASONE 1-0.05 % EX CREA: TOPICAL_CREAM | CUTANEOUS | Status: DC

## 2012-02-16 ENCOUNTER — Telehealth: Payer: Self-pay | Admitting: Pulmonary Disease

## 2012-02-16 MED ORDER — ALPRAZOLAM 0.5 MG PO TABS: ORAL_TABLET | ORAL | Status: DC

## 2012-02-16 NOTE — Telephone Encounter (Signed)
[facility] [address]   xANAX 0.5 TAKE 1 TABLET TID  PRN  #90 Allergies  Allergen Reactions  . Aspirin     REACTION: upset stomach  . Codeine     REACTION: nausea   DR NADEL IS THIS OK TO FILL  THANK YOU

## 2012-02-19 ENCOUNTER — Ambulatory Visit: Payer: 59 | Admitting: Pulmonary Disease

## 2012-02-19 ENCOUNTER — Telehealth: Payer: Self-pay | Admitting: Pulmonary Disease

## 2012-02-19 MED ORDER — FLUTICASONE-SALMETEROL 100-50 MCG/DOSE IN AEPB: 1.0000 | INHALATION_SPRAY | Freq: Two times a day (BID) | RESPIRATORY_TRACT | Status: DC

## 2012-02-19 MED ORDER — VITAMIN D (ERGOCALCIFEROL) 1.25 MG (50000 UNIT) PO CAPS: 50000.0000 [IU] | ORAL_CAPSULE | ORAL | Status: DC

## 2012-02-19 MED ORDER — ALBUTEROL SULFATE HFA 108 (90 BASE) MCG/ACT IN AERS: 2.0000 | INHALATION_SPRAY | RESPIRATORY_TRACT | Status: DC | PRN

## 2012-02-19 NOTE — Telephone Encounter (Signed)
Called and spoke with pt and she is aware of her appt in June with SN.  Needed meds sent to the pharmacy and these have been sent in for the pt and she is aware.

## 2012-02-20 ENCOUNTER — Ambulatory Visit: Payer: 59 | Admitting: Pulmonary Disease

## 2012-03-18 ENCOUNTER — Other Ambulatory Visit: Payer: Self-pay | Admitting: *Deleted

## 2012-03-20 ENCOUNTER — Other Ambulatory Visit: Payer: Self-pay | Admitting: Pulmonary Disease

## 2012-03-20 MED ORDER — CLOTRIMAZOLE-BETAMETHASONE 1-0.05 % EX CREA: TOPICAL_CREAM | CUTANEOUS | Status: DC

## 2012-04-08 ENCOUNTER — Ambulatory Visit: Payer: 59 | Admitting: Pulmonary Disease

## 2012-04-23 ENCOUNTER — Telehealth: Payer: Self-pay | Admitting: Pulmonary Disease

## 2012-04-23 MED ORDER — HYDROCODONE-ACETAMINOPHEN 5-500 MG PO TABS: ORAL_TABLET | ORAL | Status: DC

## 2012-04-23 NOTE — Telephone Encounter (Signed)
[name] [address] requesting vicodin 5-500mg  <> take 1 tablet q 6 hours prn needed for back pain . (no more than 3 a day) #90 Allergies  Allergen Reactions  . Aspirin     REACTION: upset stomach  . Codeine     REACTION: nausea   appt scheduled for July 17 ,cancelled the last 4 appts .last seen 04/2011 Dr Kriste Basque please advise  Thank you

## 2012-04-23 NOTE — Telephone Encounter (Signed)
Refill of the vicodin has been called to the pts pharmacy with no refills.  Nothing further needed.  Pt will need to keep her appt with SN on 7/17.

## 2012-04-23 NOTE — Telephone Encounter (Signed)
Ok to fill enough to  Last till her appt on July 17.  No further refills until pt has appt with Dr. Kriste Basque.  thanks

## 2012-04-25 ENCOUNTER — Encounter (HOSPITAL_COMMUNITY): Payer: Self-pay

## 2012-04-25 ENCOUNTER — Emergency Department (HOSPITAL_COMMUNITY)
Admission: EM | Admit: 2012-04-25 | Discharge: 2012-04-26 | Disposition: A | Discharge status: Home / Self Care | Payer: Medicare Other | Attending: Emergency Medicine | Admitting: Emergency Medicine

## 2012-04-25 ENCOUNTER — Emergency Department (HOSPITAL_COMMUNITY): Payer: Medicare Other

## 2012-04-25 DIAGNOSIS — F489 Nonpsychotic mental disorder, unspecified: Secondary | ICD-10-CM | POA: Diagnosis not present

## 2012-04-25 DIAGNOSIS — J449 Chronic obstructive pulmonary disease, unspecified: Secondary | ICD-10-CM | POA: Insufficient documentation

## 2012-04-25 DIAGNOSIS — Z886 Allergy status to analgesic agent status: Secondary | ICD-10-CM | POA: Insufficient documentation

## 2012-04-25 DIAGNOSIS — J4489 Other specified chronic obstructive pulmonary disease: Secondary | ICD-10-CM | POA: Diagnosis not present

## 2012-04-25 DIAGNOSIS — I872 Venous insufficiency (chronic) (peripheral): Secondary | ICD-10-CM

## 2012-04-25 DIAGNOSIS — M7989 Other specified soft tissue disorders: Secondary | ICD-10-CM | POA: Diagnosis not present

## 2012-04-25 DIAGNOSIS — M79609 Pain in unspecified limb: Secondary | ICD-10-CM | POA: Diagnosis not present

## 2012-04-25 DIAGNOSIS — I10 Essential (primary) hypertension: Secondary | ICD-10-CM | POA: Insufficient documentation

## 2012-04-25 DIAGNOSIS — Z885 Allergy status to narcotic agent status: Secondary | ICD-10-CM | POA: Insufficient documentation

## 2012-04-25 DIAGNOSIS — E559 Vitamin D deficiency, unspecified: Secondary | ICD-10-CM | POA: Diagnosis not present

## 2012-04-25 DIAGNOSIS — R7309 Other abnormal glucose: Secondary | ICD-10-CM | POA: Diagnosis not present

## 2012-04-25 DIAGNOSIS — Z79899 Other long term (current) drug therapy: Secondary | ICD-10-CM | POA: Diagnosis not present

## 2012-04-25 DIAGNOSIS — D649 Anemia, unspecified: Secondary | ICD-10-CM

## 2012-04-25 DIAGNOSIS — M25569 Pain in unspecified knee: Secondary | ICD-10-CM | POA: Diagnosis not present

## 2012-04-25 DIAGNOSIS — E876 Hypokalemia: Secondary | ICD-10-CM

## 2012-04-25 LAB — CBC WITH DIFFERENTIAL/PLATELET
Eosinophils Absolute: 0.6 10*3/uL (ref 0.0–0.7)
Eosinophils Relative: 11 % — ABNORMAL HIGH (ref 0–5)
HCT: 28.5 % — ABNORMAL LOW (ref 36.0–46.0)
Lymphocytes Relative: 17 % (ref 12–46)
Lymphs Abs: 1 10*3/uL (ref 0.7–4.0)
MCH: 27 pg (ref 26.0–34.0)
MCV: 82.8 fL (ref 78.0–100.0)
Monocytes Absolute: 0.5 10*3/uL (ref 0.1–1.0)
Monocytes Relative: 8 % (ref 3–12)
RBC: 3.44 MIL/uL — ABNORMAL LOW (ref 3.87–5.11)
WBC: 5.5 10*3/uL (ref 4.0–10.5)

## 2012-04-25 LAB — BASIC METABOLIC PANEL
BUN: 13 mg/dL (ref 6–23)
CO2: 25 mEq/L (ref 19–32)
Calcium: 9.2 mg/dL (ref 8.4–10.5)
Creatinine, Ser: 1.07 mg/dL (ref 0.50–1.10)
GFR calc non Af Amer: 48 mL/min — ABNORMAL LOW (ref >90)
Glucose, Bld: 79 mg/dL (ref 70–99)
Sodium: 140 mEq/L (ref 135–145)

## 2012-04-25 MED ORDER — HYDROXYZINE HCL 25 MG PO TABS
25.0000 mg | ORAL_TABLET | Freq: Once | ORAL | Status: AC
  Administered 2012-04-25: 25 mg via ORAL
  Filled 2012-04-25: qty 1

## 2012-04-25 MED ORDER — POTASSIUM CHLORIDE CRYS ER 20 MEQ PO TBCR: 40.0000 meq | EXTENDED_RELEASE_TABLET | Freq: Once | ORAL | Status: DC

## 2012-04-25 NOTE — ED Notes (Signed)
Pt walked to the restroom with minimal assistance. Pt asked to get urine sample and given cup
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
## 2012-04-26 LAB — URINALYSIS, ROUTINE W REFLEX MICROSCOPIC
Bilirubin Urine: NEGATIVE
Glucose, UA: NEGATIVE mg/dL
Hgb urine dipstick: NEGATIVE
Protein, ur: NEGATIVE mg/dL

## 2012-04-26 LAB — URINE MICROSCOPIC-ADD ON

## 2012-04-26 MED ORDER — DOXYCYCLINE HYCLATE 100 MG PO CAPS: 100.0000 mg | ORAL_CAPSULE | Freq: Two times a day (BID) | ORAL | Status: AC

## 2012-04-26 MED ORDER — POTASSIUM CHLORIDE 20 MEQ/15ML (10%) PO LIQD
40.0000 meq | Freq: Once | ORAL | Status: AC
  Administered 2012-04-26: 40 meq via ORAL
  Filled 2012-04-26: qty 30

## 2012-04-26 NOTE — ED Notes (Signed)
Pt alert and oriented, with steady gait at time of discharge. Pt given discharge papers and papers explained. All questions answered and pt wheeled to discharge.  

## 2012-04-26 NOTE — ED Provider Notes (Signed)
Medical screening examination/treatment/procedure(s) were performed by non-physician practitioner and as supervising physician I was immediately available for consultation/collaboration.   Loren Racer, MD 04/26/12 2325
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
## 2012-05-15 ENCOUNTER — Ambulatory Visit (INDEPENDENT_AMBULATORY_CARE_PROVIDER_SITE_OTHER)
Admission: RE | Admit: 2012-05-15 | Discharge: 2012-05-15 | Disposition: A | Discharge status: Home / Self Care | Payer: 59 | Source: Ambulatory Visit | Attending: Pulmonary Disease | Admitting: Pulmonary Disease

## 2012-05-15 ENCOUNTER — Encounter: Payer: Self-pay | Admitting: Pulmonary Disease

## 2012-05-15 ENCOUNTER — Other Ambulatory Visit (INDEPENDENT_AMBULATORY_CARE_PROVIDER_SITE_OTHER): Payer: 59

## 2012-05-15 ENCOUNTER — Ambulatory Visit (INDEPENDENT_AMBULATORY_CARE_PROVIDER_SITE_OTHER): Payer: Medicare Other | Admitting: Pulmonary Disease

## 2012-05-15 VITALS — BP 148/82 | HR 76 | Temp 96.9°F | Ht 64.5 in | Wt 139.8 lb

## 2012-05-15 DIAGNOSIS — N200 Calculus of kidney: Secondary | ICD-10-CM

## 2012-05-15 DIAGNOSIS — E559 Vitamin D deficiency, unspecified: Secondary | ICD-10-CM

## 2012-05-15 DIAGNOSIS — J449 Chronic obstructive pulmonary disease, unspecified: Secondary | ICD-10-CM

## 2012-05-15 DIAGNOSIS — I10 Essential (primary) hypertension: Secondary | ICD-10-CM

## 2012-05-15 DIAGNOSIS — J4489 Other specified chronic obstructive pulmonary disease: Secondary | ICD-10-CM | POA: Diagnosis not present

## 2012-05-15 DIAGNOSIS — F419 Anxiety disorder, unspecified: Secondary | ICD-10-CM

## 2012-05-15 DIAGNOSIS — D649 Anemia, unspecified: Secondary | ICD-10-CM

## 2012-05-15 DIAGNOSIS — M545 Low back pain, unspecified: Secondary | ICD-10-CM

## 2012-05-15 DIAGNOSIS — R609 Edema, unspecified: Secondary | ICD-10-CM | POA: Diagnosis not present

## 2012-05-15 DIAGNOSIS — F489 Nonpsychotic mental disorder, unspecified: Secondary | ICD-10-CM

## 2012-05-15 DIAGNOSIS — I872 Venous insufficiency (chronic) (peripheral): Secondary | ICD-10-CM | POA: Diagnosis not present

## 2012-05-15 DIAGNOSIS — J45909 Unspecified asthma, uncomplicated: Secondary | ICD-10-CM | POA: Diagnosis not present

## 2012-05-15 DIAGNOSIS — F411 Generalized anxiety disorder: Secondary | ICD-10-CM

## 2012-05-15 DIAGNOSIS — D472 Monoclonal gammopathy: Secondary | ICD-10-CM

## 2012-05-15 DIAGNOSIS — R7309 Other abnormal glucose: Secondary | ICD-10-CM

## 2012-05-15 DIAGNOSIS — L259 Unspecified contact dermatitis, unspecified cause: Secondary | ICD-10-CM

## 2012-05-15 LAB — BASIC METABOLIC PANEL
CO2: 30 mEq/L (ref 19–32)
GFR: 64.44 mL/min (ref >60.00)
Glucose, Bld: 95 mg/dL (ref 70–99)
Potassium: 3.6 mEq/L (ref 3.5–5.1)
Sodium: 137 mEq/L (ref 135–145)

## 2012-05-15 LAB — HEPATIC FUNCTION PANEL
AST: 35 U/L (ref 0–37)
Albumin: 3.5 g/dL (ref 3.5–5.2)
Alkaline Phosphatase: 74 U/L (ref 39–117)
Bilirubin, Direct: 0.1 mg/dL (ref 0.0–0.3)
Total Protein: 9.4 g/dL — ABNORMAL HIGH (ref 6.0–8.3)

## 2012-05-15 LAB — CBC WITH DIFFERENTIAL/PLATELET
Basophils Absolute: 0 10*3/uL (ref 0.0–0.1)
Eosinophils Absolute: 0.4 10*3/uL (ref 0.0–0.7)
HCT: 32.1 % — ABNORMAL LOW (ref 36.0–46.0)
Hemoglobin: 10.4 g/dL — ABNORMAL LOW (ref 12.0–15.0)
Lymphs Abs: 1.1 10*3/uL (ref 0.7–4.0)
MCHC: 32.3 g/dL (ref 30.0–36.0)
Monocytes Absolute: 0.5 10*3/uL (ref 0.1–1.0)
Neutro Abs: 3.8 10*3/uL (ref 1.4–7.7)
Platelets: 300 10*3/uL (ref 150.0–400.0)
RDW: 15.8 % — ABNORMAL HIGH (ref 11.5–14.6)

## 2012-05-15 MED ORDER — CLOTRIMAZOLE-BETAMETHASONE 1-0.05 % EX CREA: 1.0000 "application " | TOPICAL_CREAM | Freq: Two times a day (BID) | CUTANEOUS | Status: DC

## 2012-05-15 MED ORDER — POTASSIUM CHLORIDE 20 MEQ/15ML (10%) PO LIQD: ORAL | Status: DC

## 2012-05-15 NOTE — Patient Instructions (Addendum)
Today we updated your med list in our EPIC system...    Continue your current medications the same...    We increased your Potassium liq to 1 tablesp three times daily...  Today we did your follow up CXR & Blood work...    We will call you w/ the results & let you know if any further eval is needed...  We are going to refer you to a special "lymphedema" clinic w/ expertise in treating your leg problem & swelling...  Let's plan a follow up eval in 6 months.Marland KitchenMarland Kitchen
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
## 2012-05-17 ENCOUNTER — Telehealth: Payer: Self-pay | Admitting: Pulmonary Disease

## 2012-05-17 LAB — MULTIPLE MYELOMA PANEL, SERUM
Alpha-1-Globulin: 4.1 % (ref 2.9–4.9)
Alpha-2-Globulin: 10.3 % (ref 7.1–11.8)
Beta 2: 6.9 % — ABNORMAL HIGH (ref 3.2–6.5)
Beta Globulin: 5.5 % (ref 4.7–7.2)
Gamma Globulin: 35 % — ABNORMAL HIGH (ref 11.1–18.8)
IgG (Immunoglobin G), Serum: 3820 mg/dL — ABNORMAL HIGH (ref 690–1700)

## 2012-05-17 MED ORDER — ONDANSETRON HCL 4 MG PO TABS: 4.0000 mg | ORAL_TABLET | ORAL | Status: AC | PRN

## 2012-05-17 NOTE — Telephone Encounter (Signed)
Per SN---call in zofran 4 mg  #30  1 po every 4 hours prn vomiting.  This has been sent to the pts pharmacy and i called and spoke with pt and she is aware and to seek medical care at the ER or urgent care if she gets worse over the weekend.  Pt voiced her understanding of this.

## 2012-05-17 NOTE — Telephone Encounter (Signed)
Called and spoke with pt and she stated that she got up this morning vomiting and has abd pain across her belly.  Has tried ginger ale today but this has not helped.  Is requesting that something be called in for her.  SN please advise. Thanks  Allergies  Allergen Reactions  . Aspirin     REACTION: upset stomach  . Codeine     REACTION: nausea

## 2012-05-18 ENCOUNTER — Encounter: Payer: Self-pay | Admitting: Pulmonary Disease

## 2012-05-22 ENCOUNTER — Other Ambulatory Visit: Payer: Self-pay | Admitting: Pulmonary Disease

## 2012-05-22 DIAGNOSIS — R779 Abnormality of plasma protein, unspecified: Secondary | ICD-10-CM

## 2012-05-22 MED ORDER — HYDROXYZINE HCL 25 MG PO TABS: 25.0000 mg | ORAL_TABLET | ORAL | Status: DC | PRN

## 2012-05-22 NOTE — Telephone Encounter (Signed)
Faxed refill request received from [employer] [facility] forHydroxyzine25mg ; take 1 tablet by mouth every four hours as needed. Refill sent in

## 2012-05-27 ENCOUNTER — Other Ambulatory Visit: Payer: Medicare Other

## 2012-05-27 DIAGNOSIS — R779 Abnormality of plasma protein, unspecified: Secondary | ICD-10-CM

## 2012-05-27 DIAGNOSIS — R799 Abnormal finding of blood chemistry, unspecified: Secondary | ICD-10-CM | POA: Diagnosis not present

## 2012-05-28 LAB — CREATININE CLEARANCE, URINE, 24 HOUR: Creatinine: 1.22 mg/dL — ABNORMAL HIGH (ref 0.50–1.10)

## 2012-05-29 ENCOUNTER — Other Ambulatory Visit: Payer: Self-pay | Admitting: *Deleted

## 2012-05-29 LAB — UIFE/LIGHT CHAINS/TP QN, 24-HR UR
Albumin, U: DETECTED
Free Kappa/Lambda Ratio: 7.7 ratio (ref 2.04–10.37)
Free Lambda Excretion/Day: 10.64 mg/d
Free Lambda Lt Chains,Ur: 0.56 mg/dL (ref 0.02–0.67)
Total Protein, Urine-Ur/day: 171 mg/d — ABNORMAL HIGH (ref 10–140)
Total Protein, Urine: 9 mg/dL

## 2012-05-29 MED ORDER — HYDROCODONE-ACETAMINOPHEN 5-500 MG PO TABS: 1.0000 | ORAL_TABLET | Freq: Three times a day (TID) | ORAL | Status: DC | PRN

## 2012-06-18 ENCOUNTER — Other Ambulatory Visit: Payer: Self-pay | Admitting: Pulmonary Disease

## 2012-06-18 DIAGNOSIS — I872 Venous insufficiency (chronic) (peripheral): Secondary | ICD-10-CM

## 2012-06-26 ENCOUNTER — Telehealth: Payer: Self-pay | Admitting: Pulmonary Disease

## 2012-06-26 NOTE — Telephone Encounter (Signed)
Called and spoke with pt and i am not sure who called and lmom for her.  If anything further is needed i will call the pt back.

## 2012-07-03 DIAGNOSIS — R262 Difficulty in walking, not elsewhere classified: Secondary | ICD-10-CM | POA: Diagnosis not present

## 2012-07-03 DIAGNOSIS — IMO0001 Reserved for inherently not codable concepts without codable children: Secondary | ICD-10-CM | POA: Diagnosis not present

## 2012-07-03 DIAGNOSIS — R269 Unspecified abnormalities of gait and mobility: Secondary | ICD-10-CM | POA: Diagnosis not present

## 2012-07-03 DIAGNOSIS — I890 Lymphedema, not elsewhere classified: Secondary | ICD-10-CM | POA: Diagnosis not present

## 2012-07-18 ENCOUNTER — Emergency Department (HOSPITAL_COMMUNITY)
Admission: EM | Admit: 2012-07-18 | Discharge: 2012-07-18 | Disposition: A | Discharge status: Home / Self Care | Payer: Medicare Other | Attending: Emergency Medicine | Admitting: Emergency Medicine

## 2012-07-18 ENCOUNTER — Encounter (HOSPITAL_COMMUNITY): Payer: Self-pay | Admitting: Emergency Medicine

## 2012-07-18 DIAGNOSIS — Z79899 Other long term (current) drug therapy: Secondary | ICD-10-CM | POA: Diagnosis not present

## 2012-07-18 DIAGNOSIS — F29 Unspecified psychosis not due to a substance or known physiological condition: Secondary | ICD-10-CM

## 2012-07-18 DIAGNOSIS — F4320 Adjustment disorder, unspecified: Secondary | ICD-10-CM

## 2012-07-18 DIAGNOSIS — F22 Delusional disorders: Secondary | ICD-10-CM

## 2012-07-18 DIAGNOSIS — I10 Essential (primary) hypertension: Secondary | ICD-10-CM | POA: Insufficient documentation

## 2012-07-18 LAB — RAPID URINE DRUG SCREEN, HOSP PERFORMED
Amphetamines: NOT DETECTED
Barbiturates: NOT DETECTED
Tetrahydrocannabinol: NOT DETECTED

## 2012-07-18 LAB — COMPREHENSIVE METABOLIC PANEL
AST: 24 U/L (ref 0–37)
CO2: 29 mEq/L (ref 19–32)
Chloride: 98 mEq/L (ref 96–112)
Creatinine, Ser: 0.95 mg/dL (ref 0.50–1.10)
GFR calc non Af Amer: 56 mL/min — ABNORMAL LOW (ref >90)
Total Bilirubin: 0.2 mg/dL — ABNORMAL LOW (ref 0.3–1.2)

## 2012-07-18 LAB — CBC
HCT: 32.9 % — ABNORMAL LOW (ref 36.0–46.0)
MCV: 83.7 fL (ref 78.0–100.0)
Platelets: 261 10*3/uL (ref 150–400)
RBC: 3.93 MIL/uL (ref 3.87–5.11)
WBC: 5.4 10*3/uL (ref 4.0–10.5)

## 2012-07-18 LAB — SALICYLATE LEVEL: Salicylate Lvl: <2 mg/dL — ABNORMAL LOW (ref 2.8–20.0)

## 2012-07-18 MED ORDER — ALPRAZOLAM 0.25 MG PO TABS: 0.2500 mg | ORAL_TABLET | Freq: Three times a day (TID) | ORAL | Status: DC | PRN

## 2012-07-18 MED ORDER — HYDROXYZINE HCL 25 MG PO TABS: 25.0000 mg | ORAL_TABLET | ORAL | Status: DC | PRN

## 2012-07-18 MED ORDER — ALBUTEROL SULFATE HFA 108 (90 BASE) MCG/ACT IN AERS: 2.0000 | INHALATION_SPRAY | RESPIRATORY_TRACT | Status: DC | PRN

## 2012-07-18 MED ORDER — VITAMIN C 500 MG PO TABS
500.0000 mg | ORAL_TABLET | Freq: Three times a day (TID) | ORAL | Status: DC
  Administered 2012-07-18: 500 mg via ORAL
  Filled 2012-07-18 (×2): qty 1

## 2012-07-18 MED ORDER — POTASSIUM CHLORIDE 20 MEQ/15ML (10%) PO LIQD
20.0000 meq | Freq: Three times a day (TID) | ORAL | Status: DC
  Administered 2012-07-18: 20 meq via ORAL
  Filled 2012-07-18: qty 15

## 2012-07-18 MED ORDER — FERROUS SULFATE DRIED 200 (65 FE) MG PO TABS: 1.0000 | ORAL_TABLET | Freq: Two times a day (BID) | ORAL | Status: DC

## 2012-07-18 MED ORDER — FERROUS SULFATE 325 (65 FE) MG PO TABS
325.0000 mg | ORAL_TABLET | Freq: Two times a day (BID) | ORAL | Status: DC
  Administered 2012-07-18: 325 mg via ORAL
  Filled 2012-07-18 (×2): qty 1

## 2012-07-18 MED ORDER — HYDROCODONE-ACETAMINOPHEN 5-325 MG PO TABS: 1.0000 | ORAL_TABLET | Freq: Four times a day (QID) | ORAL | Status: DC | PRN

## 2012-07-18 MED ORDER — ADULT MULTIVITAMIN W/MINERALS CH
1.0000 | ORAL_TABLET | Freq: Every day | ORAL | Status: DC
Start: 2012-07-18 — End: 2012-07-18
  Administered 2012-07-18: 1 via ORAL
  Filled 2012-07-18: qty 1

## 2012-07-18 MED ORDER — THEOPHYLLINE ER 200 MG PO TB12
200.0000 mg | ORAL_TABLET | Freq: Two times a day (BID) | ORAL | Status: DC
  Administered 2012-07-18: 200 mg via ORAL
  Filled 2012-07-18: qty 1

## 2012-07-18 MED ORDER — CLOTRIMAZOLE 1 % EX CREA
TOPICAL_CREAM | Freq: Two times a day (BID) | CUTANEOUS | Status: DC
  Administered 2012-07-18: 17:00:00 via TOPICAL
  Filled 2012-07-18: qty 15

## 2012-07-18 MED ORDER — TORSEMIDE 20 MG PO TABS
40.0000 mg | ORAL_TABLET | Freq: Every day | ORAL | Status: DC
  Administered 2012-07-18: 40 mg via ORAL
  Filled 2012-07-18: qty 2

## 2012-07-18 MED ORDER — VITAMIN D (ERGOCALCIFEROL) 1.25 MG (50000 UNIT) PO CAPS
50000.0000 [IU] | ORAL_CAPSULE | ORAL | Status: DC
  Administered 2012-07-18: 50000 [IU] via ORAL
  Filled 2012-07-18: qty 1
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
## 2012-07-18 NOTE — ED Notes (Signed)
Ptar notified  

## 2012-07-18 NOTE — ED Notes (Signed)
Per conversation with charge haley rn, she has spoken to [facility] regarding sitter order. Currently there are no sitters available.
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
## 2012-07-18 NOTE — ED Provider Notes (Signed)
Pt seen by psychiatrist, dr. Carmelina Dane and mentally cleared, IVC cancelled by him--pt to be sent back home by ptar  Sophia Baker, MD 07/18/12 1739
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
## 2012-08-06 ENCOUNTER — Other Ambulatory Visit: Payer: Self-pay | Admitting: *Deleted

## 2012-08-06 MED ORDER — TORSEMIDE 20 MG PO TABS: 40.0000 mg | ORAL_TABLET | Freq: Every day | ORAL | Status: DC

## 2012-08-11 ENCOUNTER — Other Ambulatory Visit: Payer: Self-pay | Admitting: Pulmonary Disease

## 2012-09-29 ENCOUNTER — Other Ambulatory Visit: Payer: Self-pay | Admitting: Pulmonary Disease

## 2012-11-18 ENCOUNTER — Telehealth: Payer: Self-pay | Admitting: Pulmonary Disease

## 2012-11-18 ENCOUNTER — Ambulatory Visit: Payer: Medicare Other | Admitting: Pulmonary Disease

## 2012-11-18 NOTE — Telephone Encounter (Signed)
Pt is scheduled for 11/28/12 at 3 PM. Pt aware to bring all medications bottles. Nothing further was needed

## 2012-11-18 NOTE — Telephone Encounter (Signed)
Pt cancelled 6 month OV today. Last OV 05/05/12. Please advise if pt can be worked in before next available thanks

## 2012-11-18 NOTE — Telephone Encounter (Signed)
We can add pt on for 1-30 at 3pm.  Pt will need to bring all of her medication bottles to this appt.  thanks

## 2012-11-20 ENCOUNTER — Other Ambulatory Visit: Payer: Self-pay | Admitting: Pulmonary Disease

## 2012-11-20 MED ORDER — ALPRAZOLAM 0.5 MG PO TABS: 0.2500 mg | ORAL_TABLET | Freq: Three times a day (TID) | ORAL | Status: DC | PRN

## 2012-11-20 MED ORDER — HYDROCODONE-ACETAMINOPHEN 5-325 MG PO TABS: 1.0000 | ORAL_TABLET | Freq: Three times a day (TID) | ORAL | Status: DC | PRN

## 2012-11-28 ENCOUNTER — Ambulatory Visit: Payer: Medicare Other | Admitting: Pulmonary Disease

## 2012-12-03 ENCOUNTER — Telehealth: Payer: Self-pay | Admitting: Pulmonary Disease

## 2012-12-03 MED ORDER — ALPRAZOLAM 0.5 MG PO TABS: 0.2500 mg | ORAL_TABLET | Freq: Three times a day (TID) | ORAL | Status: DC | PRN

## 2012-12-03 MED ORDER — VITAMIN D (ERGOCALCIFEROL) 1.25 MG (50000 UNIT) PO CAPS: 50000.0000 [IU] | ORAL_CAPSULE | ORAL | Status: DC

## 2012-12-03 MED ORDER — TORSEMIDE 20 MG PO TABS: 40.0000 mg | ORAL_TABLET | Freq: Every day | ORAL | Status: DC

## 2012-12-03 MED ORDER — CLOTRIMAZOLE-BETAMETHASONE 1-0.05 % EX CREA: 1.0000 "application " | TOPICAL_CREAM | Freq: Two times a day (BID) | CUTANEOUS | Status: DC

## 2012-12-03 MED ORDER — FERROUS SULFATE DRIED 200 (65 FE) MG PO TABS: 1.0000 | ORAL_TABLET | Freq: Two times a day (BID) | ORAL | Status: DC

## 2012-12-03 MED ORDER — POTASSIUM CHLORIDE 20 MEQ/15ML (10%) PO LIQD: 20.0000 meq | Freq: Three times a day (TID) | ORAL | Status: DC

## 2012-12-03 MED ORDER — THEOPHYLLINE ER 200 MG PO TB12: 200.0000 mg | ORAL_TABLET | Freq: Two times a day (BID) | ORAL | Status: DC

## 2012-12-03 MED ORDER — HYDROXYZINE HCL 25 MG PO TABS: 25.0000 mg | ORAL_TABLET | ORAL | Status: DC | PRN

## 2012-12-03 MED ORDER — HYDROCODONE-ACETAMINOPHEN 5-325 MG PO TABS: 1.0000 | ORAL_TABLET | Freq: Three times a day (TID) | ORAL | Status: DC | PRN

## 2012-12-03 MED ORDER — ALBUTEROL SULFATE HFA 108 (90 BASE) MCG/ACT IN AERS: 2.0000 | INHALATION_SPRAY | RESPIRATORY_TRACT | Status: DC | PRN

## 2012-12-03 NOTE — Telephone Encounter (Signed)
Per Leigh ok to call in rx with no refills rx sent  Pt is aware... Nothing further needed.

## 2012-12-03 NOTE — Telephone Encounter (Signed)
Pt requesting that all of her meds be called to the [facility] on [address] . Pt has appt on 01-06-2013 Xanax 0.5mg  lotrisone cream Iron 65mg  vicodin 5/325 Atarax 25mg  Pot. 20 meq provental inhaler Theophylline 200mg   demadex 20mg  Vitamin d 50,000 Pt has appt 01-06-13 Allergies  Allergen Reactions  . Aspirin     REACTION: upset stomach  . Codeine     REACTION: nausea   Dr Kriste Basque please advise  Thank you

## 2012-12-04 ENCOUNTER — Ambulatory Visit: Payer: Medicare Other | Admitting: Pulmonary Disease

## 2012-12-29 ENCOUNTER — Other Ambulatory Visit: Payer: Self-pay | Admitting: Pulmonary Disease

## 2013-01-06 ENCOUNTER — Ambulatory Visit: Payer: Medicare Other | Admitting: Pulmonary Disease

## 2013-02-17 ENCOUNTER — Encounter: Payer: Self-pay | Admitting: *Deleted

## 2013-02-17 ENCOUNTER — Telehealth: Payer: Self-pay | Admitting: Pulmonary Disease

## 2013-02-17 ENCOUNTER — Ambulatory Visit: Payer: Medicare Other | Admitting: Pulmonary Disease

## 2013-02-17 MED ORDER — ONDANSETRON HCL 4 MG PO TABS: 4.0000 mg | ORAL_TABLET | ORAL | Status: DC | PRN

## 2013-02-17 NOTE — Telephone Encounter (Signed)
zofran 4 mg #20 1 po every 4 hours as needed for nausea Sent to [facility]  ATC pt x 1 --- no answer/no machine  Will forward message back to leight as FYI that the patient have not received the instructions per SN-but that Rx for Zofran has been called into pharm.

## 2013-02-17 NOTE — Telephone Encounter (Signed)
Per SN---  Call in zofran 4 mg  #20  1 po every 4 hours as needed for nausea  If she feels that she cannot hold this down we can call in phenergan supp  #12  1 inserted every 4 hours as needed for nausea Will need to do clear liquid diet and if she is not able to keep anything down she will need to go to the ER for eval.    Attempted to call pt back x 2  and the phone just rings.  No machine to leave a message.

## 2013-02-17 NOTE — Telephone Encounter (Signed)
ATC PT NA unable to leave VM. Phone line rang several times. WCB

## 2013-02-17 NOTE — Telephone Encounter (Signed)
ATC NA and no option to leave msg, WCB 

## 2013-02-17 NOTE — Telephone Encounter (Signed)
Spoke with pt She is c/o nausea and vomiting for the past 5 days She states not even able to hold any water down No abd pain or diarrhea She states no fever "but head feels hot" She is asking for something to be called in Please advise thanks! Allergies  Allergen Reactions  . Aspirin     REACTION: upset stomach  . Codeine     REACTION: nausea   Last ov 05/15/12 Next ov 03/25/13

## 2013-02-18 NOTE — Telephone Encounter (Signed)
Spoke with patient, made her aware of recs per SN and that Rx has been sent in. Patient verbalized understanding and nothing further needed at this time.

## 2013-03-01 ENCOUNTER — Other Ambulatory Visit: Payer: Self-pay | Admitting: Pulmonary Disease

## 2013-03-21 ENCOUNTER — Other Ambulatory Visit: Payer: Self-pay | Admitting: Pulmonary Disease

## 2013-03-25 ENCOUNTER — Other Ambulatory Visit: Payer: Self-pay | Admitting: Pulmonary Disease

## 2013-03-25 ENCOUNTER — Ambulatory Visit: Payer: Medicare Other | Admitting: Pulmonary Disease

## 2013-03-25 ENCOUNTER — Telehealth: Payer: Self-pay | Admitting: Pulmonary Disease

## 2013-03-25 MED ORDER — ONDANSETRON HCL 4 MG PO TABS: 4.0000 mg | ORAL_TABLET | ORAL | Status: DC | PRN

## 2013-03-25 NOTE — Telephone Encounter (Signed)
Per SN---  Ok for zofran again or we can call in phenergan suppositories  25 mg  #12  1 inserted every 6 hours as needed for nausea
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
## 2013-03-25 NOTE — Telephone Encounter (Signed)
Called spoke with patient Advised of SN's recs as stated below Pt prefers to have another rx of the zofran Rx telephoned to verified pharmacy (left on pharmacy voice mail) Pt advised to call back if her symptoms do not improve or worsen Nothing further needed at this time; will sign off

## 2013-04-01 ENCOUNTER — Ambulatory Visit: Payer: Medicare Other | Admitting: Pulmonary Disease

## 2013-04-02 ENCOUNTER — Ambulatory Visit: Payer: Medicare Other | Admitting: Adult Health

## 2013-04-03 ENCOUNTER — Ambulatory Visit: Payer: Medicare Other | Admitting: Adult Health

## 2013-04-03 ENCOUNTER — Telehealth: Payer: Self-pay | Admitting: Adult Health

## 2013-04-03 NOTE — Telephone Encounter (Signed)
Spoke with pt She is c/o dizziness for the past several days Requesting ov with SN He had opening and will see SN at 10:30 am tomorrow 5/6//14 ED or UC sooner if worsens

## 2013-04-04 ENCOUNTER — Ambulatory Visit (INDEPENDENT_AMBULATORY_CARE_PROVIDER_SITE_OTHER): Payer: Medicare Other | Admitting: Pulmonary Disease

## 2013-04-04 ENCOUNTER — Other Ambulatory Visit (INDEPENDENT_AMBULATORY_CARE_PROVIDER_SITE_OTHER): Payer: Medicare Other

## 2013-04-04 ENCOUNTER — Ambulatory Visit: Payer: Medicare Other | Admitting: Adult Health

## 2013-04-04 ENCOUNTER — Ambulatory Visit (INDEPENDENT_AMBULATORY_CARE_PROVIDER_SITE_OTHER)
Admission: RE | Admit: 2013-04-04 | Discharge: 2013-04-04 | Disposition: A | Discharge status: Home / Self Care | Payer: Medicare Other | Source: Ambulatory Visit | Attending: Pulmonary Disease | Admitting: Pulmonary Disease

## 2013-04-04 VITALS — BP 138/70 | HR 76 | Temp 98.0°F | Ht 61.0 in | Wt 134.4 lb

## 2013-04-04 DIAGNOSIS — F22 Delusional disorders: Secondary | ICD-10-CM

## 2013-04-04 DIAGNOSIS — E538 Deficiency of other specified B group vitamins: Secondary | ICD-10-CM

## 2013-04-04 DIAGNOSIS — J449 Chronic obstructive pulmonary disease, unspecified: Secondary | ICD-10-CM | POA: Diagnosis not present

## 2013-04-04 DIAGNOSIS — I10 Essential (primary) hypertension: Secondary | ICD-10-CM

## 2013-04-04 DIAGNOSIS — L259 Unspecified contact dermatitis, unspecified cause: Secondary | ICD-10-CM

## 2013-04-04 DIAGNOSIS — R7309 Other abnormal glucose: Secondary | ICD-10-CM

## 2013-04-04 DIAGNOSIS — D649 Anemia, unspecified: Secondary | ICD-10-CM

## 2013-04-04 DIAGNOSIS — F411 Generalized anxiety disorder: Secondary | ICD-10-CM

## 2013-04-04 DIAGNOSIS — F489 Nonpsychotic mental disorder, unspecified: Secondary | ICD-10-CM

## 2013-04-04 DIAGNOSIS — M545 Low back pain, unspecified: Secondary | ICD-10-CM

## 2013-04-04 DIAGNOSIS — K219 Gastro-esophageal reflux disease without esophagitis: Secondary | ICD-10-CM

## 2013-04-04 DIAGNOSIS — E559 Vitamin D deficiency, unspecified: Secondary | ICD-10-CM

## 2013-04-04 DIAGNOSIS — J4489 Other specified chronic obstructive pulmonary disease: Secondary | ICD-10-CM

## 2013-04-04 DIAGNOSIS — I872 Venous insufficiency (chronic) (peripheral): Secondary | ICD-10-CM

## 2013-04-04 DIAGNOSIS — I890 Lymphedema, not elsewhere classified: Secondary | ICD-10-CM

## 2013-04-04 DIAGNOSIS — R030 Elevated blood-pressure reading, without diagnosis of hypertension: Secondary | ICD-10-CM | POA: Diagnosis not present

## 2013-04-04 DIAGNOSIS — R609 Edema, unspecified: Secondary | ICD-10-CM

## 2013-04-04 DIAGNOSIS — N200 Calculus of kidney: Secondary | ICD-10-CM

## 2013-04-04 HISTORY — DX: Delusional disorders: F22

## 2013-04-04 LAB — BASIC METABOLIC PANEL
Chloride: 100 mEq/L (ref 96–112)
Potassium: 3.5 mEq/L (ref 3.5–5.1)
Sodium: 134 mEq/L — ABNORMAL LOW (ref 135–145)

## 2013-04-04 LAB — CBC WITH DIFFERENTIAL/PLATELET
Eosinophils Absolute: 0.1 10*3/uL (ref 0.0–0.7)
HCT: 26.7 % — ABNORMAL LOW (ref 36.0–46.0)
Lymphs Abs: 1 10*3/uL (ref 0.7–4.0)
MCHC: 32.5 g/dL (ref 30.0–36.0)
MCV: 79.8 fl (ref 78.0–100.0)
Monocytes Absolute: 0.4 10*3/uL (ref 0.1–1.0)
Neutrophils Relative %: 72.8 % (ref 43.0–77.0)
Platelets: 624 10*3/uL — ABNORMAL HIGH (ref 150.0–400.0)

## 2013-04-04 LAB — HEPATIC FUNCTION PANEL
ALT: 15 U/L (ref 0–35)
AST: 20 U/L (ref 0–37)
Alkaline Phosphatase: 70 U/L (ref 39–117)
Bilirubin, Direct: 0 mg/dL (ref 0.0–0.3)
Total Bilirubin: 0.4 mg/dL (ref 0.3–1.2)

## 2013-04-04 LAB — HEMOGLOBIN A1C: Hgb A1c MFr Bld: 5.6 % (ref 4.6–6.5)

## 2013-04-04 LAB — TSH: TSH: 0.79 u[IU]/mL (ref 0.35–5.50)

## 2013-04-04 MED ORDER — SILVER SULFADIAZINE 1 % EX CREA: TOPICAL_CREAM | CUTANEOUS | Status: DC

## 2013-04-04 NOTE — Patient Instructions (Addendum)
Today we updated your med list in our EPIC system...    Continue your current medications the same...    We refilled the meds you requested...  For your legs & severe lymphedema proble,>>    NO SALT or Sodium in diet...    ELEVATE your legs in recliner...    Clean your legs twice daily w/ mild soapy water 7 pat dry...    Then cover w SILVADENE Cream...  We will arrange for visiting nurses to come out & help w/ your leg therapy...  We will refer you back to the [facility] to work on your severe leg problem...  Today we did your follow up CXR & BLOOD work...    We will contact you w/ the results when available...   We will arrange for an Echocardiogram to be done at the [facility] office...  Call for any questions...  Let's plan a follow up visit in 66mo, sooner if needed for problems.Marland KitchenMarland Kitchen

## 2013-04-05 ENCOUNTER — Encounter: Payer: Self-pay | Admitting: Pulmonary Disease

## 2013-04-05 LAB — VITAMIN D 25 HYDROXY (VIT D DEFICIENCY, FRACTURES): Vit D, 25-Hydroxy: 53 ng/mL (ref 30–89)
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
## 2013-04-07 ENCOUNTER — Telehealth: Payer: Self-pay | Admitting: Pulmonary Disease

## 2013-04-07 NOTE — Telephone Encounter (Signed)
Noted. I will forward this message to Marliss Czar so that SN can be aware of this.

## 2013-04-08 ENCOUNTER — Telehealth: Payer: Self-pay | Admitting: *Deleted

## 2013-04-08 DIAGNOSIS — R778 Other specified abnormalities of plasma proteins: Secondary | ICD-10-CM

## 2013-04-08 DIAGNOSIS — D649 Anemia, unspecified: Secondary | ICD-10-CM

## 2013-04-08 NOTE — Telephone Encounter (Signed)
Received call report from [place] at [facility].  Total protein = 10.3.  Revonda Standard will fax results to triage.  SN informed.  Will route to Leigh's box to ensure results are received and for follow up.

## 2013-04-09 LAB — MULTIPLE MYELOMA PANEL, SERUM
Beta Globulin: 5.9 % (ref 4.7–7.2)
Gamma Globulin: 41.5 % — ABNORMAL HIGH (ref 11.1–18.8)
IgA: 766 mg/dL — ABNORMAL HIGH (ref 69–380)
IgG (Immunoglobin G), Serum: 5000 mg/dL — ABNORMAL HIGH (ref 690–1700)
Total Protein: 10.3 g/dL — ABNORMAL HIGH (ref 6.0–8.3)

## 2013-04-09 NOTE — Telephone Encounter (Signed)
SN is aware of call report that was taken by Aggie Cosier, RN on 04/08/13.  SN is waiting for the full panel to come back form the lab.  I have called [facility] and spoke with  Lorin Picket and he is going to check with the lab to find out when the full results will be available.

## 2013-04-10 MED ORDER — ALBUTEROL SULFATE HFA 108 (90 BASE) MCG/ACT IN AERS: 2.0000 | INHALATION_SPRAY | RESPIRATORY_TRACT | Status: DC | PRN

## 2013-04-10 MED ORDER — HYDROXYZINE HCL 25 MG PO TABS: 25.0000 mg | ORAL_TABLET | ORAL | Status: DC | PRN

## 2013-04-10 MED ORDER — ONDANSETRON HCL 4 MG PO TABS: 4.0000 mg | ORAL_TABLET | ORAL | Status: DC | PRN

## 2013-04-10 NOTE — Telephone Encounter (Signed)
Called and spoke with pt and she is aware of lab and cxr results per SN.  Pt is aware to come to the office for labs and to pick up stool cards.  Pt is aware of orders placed to see hematology and GI.  Nothing further is needed.

## 2013-05-05 ENCOUNTER — Telehealth: Payer: Self-pay | Admitting: Internal Medicine

## 2013-05-05 ENCOUNTER — Ambulatory Visit: Payer: Medicare Other | Admitting: Internal Medicine

## 2013-05-18 ENCOUNTER — Other Ambulatory Visit: Payer: Self-pay | Admitting: Pulmonary Disease

## 2013-05-20 ENCOUNTER — Telehealth: Payer: Self-pay | Admitting: Pulmonary Disease

## 2013-05-20 DIAGNOSIS — I890 Lymphedema, not elsewhere classified: Secondary | ICD-10-CM

## 2013-05-20 DIAGNOSIS — I10 Essential (primary) hypertension: Secondary | ICD-10-CM

## 2013-05-20 DIAGNOSIS — I872 Venous insufficiency (chronic) (peripheral): Secondary | ICD-10-CM

## 2013-05-20 NOTE — Telephone Encounter (Signed)
Spoke with patient- 1)states she feels like Demadex is not helping get the fluid off of her and wants to know what else she can take 2)would like a Rx for K+ tablets instead of liquid as she doesn't like it 3)would like refill for Atarax 25 mg for itching 4)would like refill for Theodur 200 mg 1 po BID  SN please advise. Thanks

## 2013-05-22 NOTE — Telephone Encounter (Signed)
Per SN--  The demadex is the strongest diuretic.  She is taking 2 po every morning. Ok to change the potassium to k20  1 po tid   #90 and refill prn. SN wants to know the status of her appts with   1. Lymphedema clinic 2. GI appt 3.  Hematology  Attempted to call pt but no machine and no answer.  Will try back later.

## 2013-05-27 ENCOUNTER — Telehealth: Payer: Self-pay | Admitting: Oncology

## 2013-05-27 MED ORDER — POTASSIUM CHLORIDE 20 MEQ PO PACK: 20.0000 meq | PACK | Freq: Three times a day (TID) | ORAL | Status: DC

## 2013-05-27 NOTE — Telephone Encounter (Signed)
Called [facility] for Tiffany to check on status of appointment and return my call. Re-faxed form over to [facility]. Waiting on appointment. Rhonda J Cobb

## 2013-05-27 NOTE — Telephone Encounter (Signed)
C/D 05/27/13 for appt. 06/17/13

## 2013-05-27 NOTE — Telephone Encounter (Signed)
Spoke with patient advised her of below Patient aware Rx has been sent to verified pharmacy  Patient states she has appt with GI 06/04/13 @1015 , she has not heard from Hematology or lymphedema clinic or been scheduled for echo I have routed msg to [facility] to get patient referred to these offices and also placed order (as noted in 04/04/13 OV) for echo  Patient also asking ab labs and stool sample ( as mentioned in 04/08/13 telephone note) I have informed patient that orders are already in the computer and all she needs to do is come to our lab and have these done at her convenience.  Nothing further needed at this time Will also print off for Dr. Kriste Basque so that he is aware

## 2013-05-27 NOTE — Telephone Encounter (Signed)
Echo has been scheduled for Thurs 05/29/13 at 9:30 at [facility], [address] 3rd floor, [place]. LM with pt's husband for her to contact me regarding these appointments. Rhonda J Cobb

## 2013-05-27 NOTE — Telephone Encounter (Signed)
S/W PT IN RE NP APPT 08/19 @ 1:30 W/DR. SHADAD.  REFERRING DR. Lorin Picket NADEL DX- ANEMIA; ABN SERUM PROTEIN ELECTROPHESIS WELCOME PACKET MAILED

## 2013-05-29 ENCOUNTER — Other Ambulatory Visit (HOSPITAL_COMMUNITY): Payer: Medicare Other

## 2013-06-03 NOTE — Telephone Encounter (Signed)
Attempted to contact patient again today to see if she had got her insurance issues worked out and if we are able to proceed to schedule her 2D Echo. Husband answered the phone and couldn't get patient to the phone. He took down my phone number and was going to ask that she contact me back. Rhonda J Cobb

## 2013-06-03 NOTE — Telephone Encounter (Signed)
Echo appt was canceled so I LMTCBx1 at pt home to see if she wants this to be rescheduled. Carron Curie, CMA

## 2013-06-03 NOTE — Telephone Encounter (Signed)
Called and spoke with patient who states that she hasn't spoke with [facility] yet, that she plans on calling them tomorrow. I advised patient that I would go ahead and scheduled the 2D Echo for one afternoon next week, which will allow her enough time to speak with [facility]. Pt stated that would be fine. Will [address] tomorrow to schedule. Rhonda J Cobb

## 2013-06-03 NOTE — Telephone Encounter (Signed)
Returning call can be reached at [phone].[name]

## 2013-06-04 ENCOUNTER — Ambulatory Visit: Payer: Medicare Other | Admitting: Internal Medicine

## 2013-06-05 ENCOUNTER — Telehealth: Payer: Self-pay | Admitting: Oncology

## 2013-06-05 NOTE — Telephone Encounter (Signed)
C/D 06/05/13 for appt.06/17/13

## 2013-06-06 NOTE — Telephone Encounter (Signed)
Spoke with patient and she stated that she has new insurance with [facility] and she has got that straighten out. Appointment for 2D Echo has been arranged for Friday 06/13/13 at 1:00 at [facility], 3rd floor. Pt is aware of appointment date, time and location. Will contact [facility] and check for prior authorization. Rhonda J Cobb

## 2013-06-13 ENCOUNTER — Other Ambulatory Visit (HOSPITAL_COMMUNITY): Payer: Medicare Other

## 2013-06-13 ENCOUNTER — Other Ambulatory Visit: Payer: Self-pay | Admitting: Oncology

## 2013-06-13 DIAGNOSIS — D729 Disorder of white blood cells, unspecified: Secondary | ICD-10-CM

## 2013-06-13 DIAGNOSIS — D649 Anemia, unspecified: Secondary | ICD-10-CM

## 2013-06-17 ENCOUNTER — Ambulatory Visit: Payer: Medicare Other | Admitting: Oncology

## 2013-06-17 ENCOUNTER — Other Ambulatory Visit: Payer: Medicare Other | Admitting: Lab

## 2013-06-17 ENCOUNTER — Ambulatory Visit: Payer: Medicare Other

## 2013-06-19 ENCOUNTER — Other Ambulatory Visit (HOSPITAL_COMMUNITY): Payer: Medicare Other

## 2013-06-26 ENCOUNTER — Other Ambulatory Visit: Payer: Self-pay | Admitting: Pulmonary Disease

## 2013-07-04 ENCOUNTER — Ambulatory Visit: Payer: Medicare Other | Admitting: Internal Medicine

## 2013-08-01 ENCOUNTER — Telehealth: Payer: Self-pay | Admitting: Gastroenterology

## 2013-08-01 ENCOUNTER — Ambulatory Visit: Payer: Medicare Other | Admitting: Gastroenterology

## 2013-08-01 NOTE — Telephone Encounter (Signed)
No charge. 

## 2013-09-03 ENCOUNTER — Ambulatory Visit: Payer: Medicare Other | Admitting: Gastroenterology

## 2013-09-22 ENCOUNTER — Telehealth: Payer: Self-pay | Admitting: Pulmonary Disease

## 2013-09-22 NOTE — Telephone Encounter (Signed)
I called and spoke with [facility] was advised this was already taken care of and pt received tablets. Nothing further needed

## 2013-09-30 ENCOUNTER — Telehealth: Payer: Self-pay | Admitting: Pulmonary Disease

## 2013-09-30 NOTE — Telephone Encounter (Signed)
Per SN---  He does not do house calls but we can give her the number to doctors making house calls.  i have called and spoke with lisa and she stated that the pt will not leave the house to come to office visits to see her doctors and the family is wanting to find a doctor that makes the house calls for her.  Misty Stanley stated that the pts legs are awful now, with necrotic skin on both legs and feet.  Pt has refused any kind of treatment.  Misty Stanley is aware of the number and she will call to try and set up an appt.

## 2013-10-10 ENCOUNTER — Telehealth: Payer: Self-pay | Admitting: Pulmonary Disease

## 2013-10-10 NOTE — Telephone Encounter (Signed)
Spoke with Misty Stanley, pt's niece.  She states that pt's legs are extremely swollen and draining so much that pt is using 6 rolls of paper towels per week to soak drainage.  There is some blood mixed with drainage and also a foul odor.  Pt given appt with Dr Kriste Basque 10/14/13 at 11:30

## 2013-10-14 ENCOUNTER — Ambulatory Visit: Payer: Medicare Other | Admitting: Pulmonary Disease

## 2013-10-14 ENCOUNTER — Telehealth: Payer: Self-pay | Admitting: Pulmonary Disease

## 2013-10-14 NOTE — Telephone Encounter (Signed)
Spoke to pt's niece. Wants to be worked in before the end of the year. Advised that SN doesn't have any available appointments before the end of the year.  Leigh please advise if there is some where she can worked in. Thanks.

## 2013-10-15 NOTE — Telephone Encounter (Signed)
Pt's niece returned call & scheduled an appt w/ SN for 10/17/13 @ 9:30.  Verbalized understanding & states nothing further needed at this time.  [name]

## 2013-10-15 NOTE — Telephone Encounter (Signed)
The only opening in the schedule for SN is this Friday---12-19 at 9:30.  thanks

## 2013-10-15 NOTE — Telephone Encounter (Signed)
lmomtcb x1 to schedule this appt

## 2013-10-17 ENCOUNTER — Other Ambulatory Visit (INDEPENDENT_AMBULATORY_CARE_PROVIDER_SITE_OTHER): Payer: Medicare Other

## 2013-10-17 ENCOUNTER — Ambulatory Visit (INDEPENDENT_AMBULATORY_CARE_PROVIDER_SITE_OTHER): Payer: Medicare Other | Admitting: Pulmonary Disease

## 2013-10-17 ENCOUNTER — Encounter: Payer: Self-pay | Admitting: Pulmonary Disease

## 2013-10-17 VITALS — BP 126/80 | HR 79 | Temp 98.0°F | Ht 61.0 in | Wt 135.0 lb

## 2013-10-17 DIAGNOSIS — I890 Lymphedema, not elsewhere classified: Secondary | ICD-10-CM | POA: Insufficient documentation

## 2013-10-17 DIAGNOSIS — I10 Essential (primary) hypertension: Secondary | ICD-10-CM

## 2013-10-17 DIAGNOSIS — M545 Low back pain, unspecified: Secondary | ICD-10-CM

## 2013-10-17 DIAGNOSIS — D472 Monoclonal gammopathy: Secondary | ICD-10-CM | POA: Diagnosis not present

## 2013-10-17 DIAGNOSIS — E559 Vitamin D deficiency, unspecified: Secondary | ICD-10-CM

## 2013-10-17 DIAGNOSIS — L259 Unspecified contact dermatitis, unspecified cause: Secondary | ICD-10-CM

## 2013-10-17 DIAGNOSIS — D649 Anemia, unspecified: Secondary | ICD-10-CM

## 2013-10-17 DIAGNOSIS — J449 Chronic obstructive pulmonary disease, unspecified: Secondary | ICD-10-CM | POA: Diagnosis not present

## 2013-10-17 DIAGNOSIS — F22 Delusional disorders: Secondary | ICD-10-CM | POA: Diagnosis not present

## 2013-10-17 DIAGNOSIS — N200 Calculus of kidney: Secondary | ICD-10-CM

## 2013-10-17 DIAGNOSIS — I872 Venous insufficiency (chronic) (peripheral): Secondary | ICD-10-CM | POA: Diagnosis not present

## 2013-10-17 DIAGNOSIS — J4489 Other specified chronic obstructive pulmonary disease: Secondary | ICD-10-CM

## 2013-10-17 LAB — CBC WITH DIFFERENTIAL/PLATELET
Basophils Relative: 0.3 % (ref 0.0–3.0)
Eosinophils Relative: 4.2 % (ref 0.0–5.0)
HCT: 26.6 % — ABNORMAL LOW (ref 36.0–46.0)
MCV: 79.6 fl (ref 78.0–100.0)
Monocytes Relative: 9.7 % (ref 3.0–12.0)
Neutrophils Relative %: 61.5 % (ref 43.0–77.0)
Platelets: 431 10*3/uL — ABNORMAL HIGH (ref 150.0–400.0)
RBC: 3.34 Mil/uL — ABNORMAL LOW (ref 3.87–5.11)
WBC: 4.8 10*3/uL (ref 4.5–10.5)

## 2013-10-17 LAB — BASIC METABOLIC PANEL
BUN: 21 mg/dL (ref 6–23)
Creatinine, Ser: 1.3 mg/dL — ABNORMAL HIGH (ref 0.4–1.2)
GFR: 52.12 mL/min — ABNORMAL LOW (ref >60.00)
Glucose, Bld: 82 mg/dL (ref 70–99)
Potassium: 3.6 mEq/L (ref 3.5–5.1)

## 2013-10-17 LAB — HEPATIC FUNCTION PANEL
ALT: 14 U/L (ref 0–35)
AST: 22 U/L (ref 0–37)
Albumin: 3 g/dL — ABNORMAL LOW (ref 3.5–5.2)
Total Bilirubin: 0.4 mg/dL (ref 0.3–1.2)

## 2013-10-17 NOTE — Patient Instructions (Signed)
Today we updated your med list in our EPIC system...    Continue your current medications the same...  For the Lymphedema is your legs>>    We will endeavor to sched an appt for you to see Erven Colla or Octavia Bruckner in the [facility] that deals w/ this chronic issue...    In the meanwhile> you should wash the legs as we discussed once or twice daily w/ mild soapy water, pat dry, then cover w/ the silvadene cream & a light dressing w/ a Kling wrap or Kerlex...  We need to reschedule the 2DEchocardiogram to check the heart pump function...  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  Please keep the appt w/ the gastroenterologist regarding her anemia (to be sure not bleeding from the GI tract) & eval for her GI issues...  Today we rechecked her labs...    We will contact you w/ the results when available...   We wrote for a Lift chair & a handicap sticker per request...  Let's plan a follow up visit in 39mo, sooner if needed for problems.Marland KitchenMarland Kitchen

## 2013-10-17 NOTE — Progress Notes (Addendum)
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 levels are further elevated... She cancelled the prev Heme appt & we will resched...  Psychiatric Problem>  She persists w/ paranoid ideations revolving around worker's compensation disability;  She has repeatedly refused to go to mental health or allow me to refer to a psychiatrist;  She was seen in the ER on an Involuntary commitment petition 9/13 (by her son) but the psychiatrist rescinded the petition & sent her home...  She continues to refuse any psychiatric help...   Patient's Medications  New Prescriptions   No medications on file  Previous Medications   ALBUTEROL (PROVENTIL HFA) 108 (90 BASE) MCG/ACT INHALER    Inhale 2 puffs into the lungs every 4 (four) hours as needed for wheezing.   ALPRAZOLAM (XANAX) 0.5 MG TABLET    TAKE 1/2 TO 1 TABLET BY MOUTH THREE TIMES DAILY AS NEEDED FOR NERVES   CLOTRIMAZOLE-BETAMETHASONE (LOTRISONE) CREAM    Apply 1 application topically 2 (two) times daily. To her feet   FERROUS SULFATE DRIED 200 (65 FE) MG TABS    Take 1 tablet by mouth 2 (two) times daily.   HYDROCODONE-ACETAMINOPHEN (NORCO/VICODIN) 5-325 MG PER TABLET    TAKE ONE TABLET BY MOUTH THREE TIMES DAILY AS NEEDED   HYDROXYZINE (ATARAX/VISTARIL) 25 MG TABLET    Take 1 tablet (25 mg total) by mouth every 4 (four) hours as needed. For itching   MULTIPLE VITAMIN (MULTIVITAMIN WITH MINERALS) TABS    Take 1 tablet by mouth daily.   ONDANSETRON  (ZOFRAN) 4 MG TABLET    Take 1 tablet (4 mg total) by mouth every 4 (four) hours as needed for nausea.   POTASSIUM CHLORIDE (KLOR-CON) 20 MEQ PACKET    Take 20 mEq by mouth 3 (three) times daily.   SILVER SULFADIAZINE (SILVADENE) 1 % CREAM    Apply daily as directed   THEOPHYLLINE (THEODUR) 200 MG 12 HR TABLET    TAKE 1 TABLET BY MOUTH TWICE DAILY   TORSEMIDE (DEMADEX) 20 MG TABLET    TAKE 2 TABLETS BY MOUTH DAILY   VITAMIN C (ASCORBIC ACID) 500 MG TABLET    Take 500 mg by mouth 3 (three) times daily.    VITAMIN D, ERGOCALCIFEROL, (DRISDOL) 50000 UNITS CAPS CAPSULE    TAKE 1 CAPSULE BY MOUTH EVERY 7 DAYS ON SUNDAY OR MONDAY  Modified Medications   No medications on file  Discontinued Medications   No medications on file

## 2013-10-20 ENCOUNTER — Telehealth: Payer: Self-pay | Admitting: Oncology

## 2013-10-20 NOTE — Telephone Encounter (Signed)
SS/W PT AND G[facility] NP APPT 01/09 @ 1:30 W/DR. SHADAD  REFERRING DR. Lorin Picket NADEL DX-MGUS WELCOME PACKET MAILED

## 2013-10-21 ENCOUNTER — Telehealth: Payer: Self-pay | Admitting: Oncology

## 2013-10-21 NOTE — Telephone Encounter (Signed)
C/D 10/21/13 for appt. 11/07/13

## 2013-10-29 ENCOUNTER — Other Ambulatory Visit: Payer: Self-pay | Admitting: *Deleted

## 2013-10-29 DIAGNOSIS — I890 Lymphedema, not elsewhere classified: Secondary | ICD-10-CM

## 2013-10-29 DIAGNOSIS — I872 Venous insufficiency (chronic) (peripheral): Secondary | ICD-10-CM

## 2013-10-29 DIAGNOSIS — I10 Essential (primary) hypertension: Secondary | ICD-10-CM

## 2013-10-29 MED ORDER — VITAMIN D (ERGOCALCIFEROL) 1.25 MG (50000 UNIT) PO CAPS: ORAL_CAPSULE | ORAL | Status: DC

## 2013-10-29 MED ORDER — HYDROXYZINE HCL 25 MG PO TABS: 25.0000 mg | ORAL_TABLET | ORAL | Status: DC | PRN

## 2013-10-29 MED ORDER — POTASSIUM CHLORIDE 20 MEQ PO PACK: 20.0000 meq | PACK | Freq: Three times a day (TID) | ORAL | Status: DC

## 2013-10-29 MED ORDER — ALBUTEROL SULFATE HFA 108 (90 BASE) MCG/ACT IN AERS: 2.0000 | INHALATION_SPRAY | RESPIRATORY_TRACT | Status: DC | PRN

## 2013-10-29 MED ORDER — SILVER SULFADIAZINE 1 % EX CREA: TOPICAL_CREAM | CUTANEOUS | Status: DC

## 2013-10-29 MED ORDER — ONDANSETRON HCL 4 MG PO TABS: 4.0000 mg | ORAL_TABLET | ORAL | Status: DC | PRN

## 2013-10-29 MED ORDER — CLOTRIMAZOLE-BETAMETHASONE 1-0.05 % EX CREA: 1.0000 "application " | TOPICAL_CREAM | Freq: Two times a day (BID) | CUTANEOUS | Status: DC

## 2013-10-29 MED ORDER — TORSEMIDE 20 MG PO TABS: ORAL_TABLET | ORAL | Status: DC

## 2013-10-29 MED ORDER — THEOPHYLLINE ER 200 MG PO TB12: ORAL_TABLET | ORAL | Status: DC

## 2013-10-31 ENCOUNTER — Other Ambulatory Visit: Payer: Self-pay | Admitting: Oncology

## 2013-10-31 DIAGNOSIS — D649 Anemia, unspecified: Secondary | ICD-10-CM

## 2013-11-06 ENCOUNTER — Encounter: Payer: Self-pay | Admitting: Cardiology

## 2013-11-06 ENCOUNTER — Ambulatory Visit (HOSPITAL_COMMUNITY): Payer: Medicare Other | Attending: Cardiology | Admitting: Radiology

## 2013-11-06 DIAGNOSIS — I059 Rheumatic mitral valve disease, unspecified: Secondary | ICD-10-CM | POA: Insufficient documentation

## 2013-11-06 DIAGNOSIS — J449 Chronic obstructive pulmonary disease, unspecified: Secondary | ICD-10-CM | POA: Insufficient documentation

## 2013-11-06 DIAGNOSIS — J4489 Other specified chronic obstructive pulmonary disease: Secondary | ICD-10-CM | POA: Insufficient documentation

## 2013-11-06 DIAGNOSIS — I079 Rheumatic tricuspid valve disease, unspecified: Secondary | ICD-10-CM | POA: Diagnosis not present

## 2013-11-06 DIAGNOSIS — I10 Essential (primary) hypertension: Secondary | ICD-10-CM | POA: Diagnosis not present

## 2013-11-06 DIAGNOSIS — R609 Edema, unspecified: Secondary | ICD-10-CM

## 2013-11-06 NOTE — Progress Notes (Signed)
Echocardiogram performed.  

## 2013-11-07 ENCOUNTER — Telehealth: Payer: Self-pay | Admitting: Oncology

## 2013-11-07 ENCOUNTER — Encounter: Payer: Self-pay | Admitting: Oncology

## 2013-11-07 ENCOUNTER — Other Ambulatory Visit: Payer: Medicare Other

## 2013-11-07 ENCOUNTER — Ambulatory Visit (HOSPITAL_BASED_OUTPATIENT_CLINIC_OR_DEPARTMENT_OTHER): Payer: Medicare Other | Admitting: Oncology

## 2013-11-07 ENCOUNTER — Ambulatory Visit: Payer: Medicare Other

## 2013-11-07 VITALS — BP 155/85 | HR 89 | Temp 97.5°F | Resp 17 | Ht 61.0 in | Wt 136.1 lb

## 2013-11-07 DIAGNOSIS — D649 Anemia, unspecified: Secondary | ICD-10-CM

## 2013-11-07 NOTE — Telephone Encounter (Signed)
Gave pt appt for lab and MD on May 2015 °

## 2013-11-07 NOTE — Consult Note (Signed)
Reason for Referral: Plasma cell disorder and anemia.   HPI: 80 year old woman currently of [place] referred to me for the evaluation of paraproteinemia and anemia. She is a pleasant woman with significant comorbid conditions that includes COPD and chronic leg edema. She was noted to have an elevated protein dating back to at least 2012. She had a serum protein electrophoresis in July of 2012 which showed an M spike of about 1.1 but an elevated IgG and IgA. That was repeated most recently on 04/04/2013 which showed that her IgG still elevated at 5000 and IgA at 766. There was no M spike detected and immunofixation failed to detect a monoclonal spike. Patient was also noted to be mildly anemic with a low iron dating back to 2011. Her iron level was 33 and her hemoglobin was recently was 8.8. RDW is elevated at 20.2. Her MCV was borderline normal at 79.6. Overall, she is asymptomatic and not reporting any pathological fractures or hospitalization. Has not reported a decline in her energy or performance status. She does have chronic lower extremity edema which have from ability and quality of life.   Past Medical History  Diagnosis Date  . Obstructive chronic bronchitis without exacerbation   . Unspecified essential hypertension   . Unspecified venous (peripheral) insufficiency   . Edema 2005    "since put the glass in them"   . Other abnormal glucose   . Lumbago   . Unspecified nonpsychotic mental disorder   . Unspecified venous (peripheral) insufficiency   . Unspecified vitamin D deficiency   . Anemia, unspecified   :  Past Surgical History  Procedure Laterality Date  . Cholecystectomy    :  Current Outpatient Prescriptions  Medication Sig Dispense Refill  . albuterol (PROVENTIL HFA) 108 (90 BASE) MCG/ACT inhaler Inhale 2 puffs into the lungs every 4 (four) hours as needed for wheezing.  3 Inhaler  1  . ALPRAZolam (XANAX) 0.5 MG tablet TAKE 1/2 TO 1 TABLET BY MOUTH THREE TIMES DAILY AS  NEEDED FOR NERVES  90 tablet  1  . clotrimazole-betamethasone (LOTRISONE) cream Apply 1 application topically 2 (two) times daily. To her feet  45 g  1  . Ferrous Sulfate Dried 200 (65 FE) MG TABS Take 1 tablet by mouth 2 (two) times daily.  30 tablet  0  . HYDROcodone-acetaminophen (NORCO/VICODIN) 5-325 MG per tablet TAKE ONE TABLET BY MOUTH THREE TIMES DAILY AS NEEDED  90 tablet  1  . hydrOXYzine (ATARAX/VISTARIL) 25 MG tablet Take 1 tablet (25 mg total) by mouth every 4 (four) hours as needed. For itching  50 tablet  1  . Multiple Vitamin (MULTIVITAMIN WITH MINERALS) TABS Take 1 tablet by mouth daily.      . ondansetron (ZOFRAN) 4 MG tablet Take 1 tablet (4 mg total) by mouth every 4 (four) hours as needed for nausea.  30 tablet  1  . potassium chloride (KLOR-CON) 20 MEQ packet Take 20 mEq by mouth 3 (three) times daily.  270 tablet  1  . silver sulfADIAZINE (SILVADENE) 1 % cream Apply daily as directed  85 g  1  . theophylline (THEODUR) 200 MG 12 hr tablet TAKE 1 TABLET BY MOUTH TWICE DAILY  180 tablet  1  . torsemide (DEMADEX) 20 MG tablet TAKE 2 TABLETS BY MOUTH DAILY  180 tablet  1  . vitamin C (ASCORBIC ACID) 500 MG tablet Take 500 mg by mouth 3 (three) times daily.       . Vitamin D,  Ergocalciferol, (DRISDOL) 50000 UNITS CAPS capsule TAKE 1 CAPSULE BY MOUTH EVERY 7 DAYS ON SUNDAY OR MONDAY  12 capsule  1   No current facility-administered medications for this visit.       Allergies  Allergen Reactions  . Aspirin     REACTION: upset stomach  . Codeine     REACTION: nausea  :  No family history on file.:  History   Social History  . Marital Status: Married    Spouse Name: N/A    Number of Children: N/A  . Years of Education: N/A   Occupational History  . Not on file.   Social History Main Topics  . Smoking status: Never Smoker   . Smokeless tobacco: Not on file  . Alcohol Use: No  . Drug Use: No  . Sexual Activity: Not on file   Other Topics Concern  . Not on  file   Social History Narrative  . No narrative on file  :  Constitutional: negative for anorexia, chills and fatigue Eyes: negative for irritation and redness Ears, nose, mouth, throat, and face: negative for epistaxis, sore throat and tinnitus Respiratory: negative for hemoptysis, pneumonia and wheezing Cardiovascular: negative for chest pain, chest pressure/discomfort and irregular heart beat Gastrointestinal: negative for abdominal pain, change in bowel habits and constipation Genitourinary:negative for dysuria and hematuria Integument/breast: negative for dryness and pruritus Hematologic/lymphatic: negative for bleeding, easy bruising and lymphadenopathy Musculoskeletal:negative for muscle weakness and myalgias Neurological: negative for dizziness, gait problems and seizures Behavioral/Psych: negative for anxiety and depression Endocrine: negative for temperature intolerance Allergic/Immunologic: negative for urticaria  Exam: ECOG 2 Blood pressure 155/85, pulse 89, temperature 97.5 F (36.4 C), temperature source Oral, resp. rate 17, height 5\' 1"  (1.549 m), weight 136 lb 1.6 oz (61.735 kg), SpO2 100.00%. General appearance: alert, cooperative and appears stated age Head: Normocephalic, without obvious abnormality, atraumatic Eyes: conjunctivae/corneas clear. PERRL, EOM's intact. Fundi benign. Throat: lips, mucosa, and tongue normal; teeth and gums normal Neck: no adenopathy, no carotid bruit, no JVD, supple, symmetrical, trachea midline and thyroid not enlarged, symmetric, no tenderness/mass/nodules Back: symmetric, no curvature. ROM normal. No CVA tenderness. Resp: clear to auscultation bilaterally Chest wall: no tenderness Cardio: regular rate and rhythm, S1, S2 normal, no murmur, click, rub or gallop GI: soft, non-tender; bowel sounds normal; no masses,  no organomegaly Extremities: extremities normal, atraumatic, no cyanosis or edema Pulses: 2+ and symmetric Skin: Skin  color, texture, turgor normal. No rashes or lesions Lymph nodes: Cervical, supraclavicular, and axillary nodes normal.   Results for AHMIRA, BOISSELLE (MRN [id number]) as of 11/07/2013 14:05  Ref. Range 10/17/2013 11:51  WBC Latest Range: 4.5-10.5 K/uL 4.8  RBC Latest Range: 3.87-5.11 Mil/uL 3.34 (L)  Hemoglobin Latest Range: 12.0-15.0 g/dL 8.8 Repeated and verified X2. (L)  HCT Latest Range: 36.0-46.0 % 26.6 Repeated and verified X2. (L)  MCV Latest Range: 78.0-100.0 fl 79.6  MCHC Latest Range: 30.0-36.0 g/dL 32.9  RDW Latest Range: 11.5-14.6 % 20.2 (H)  Platelets Latest Range: 150.0-400.0 K/uL 431.0 (H)   Results for SHENNA, BRISSETTE (MRN [id number]) as of 11/07/2013 14:05  Ref. Range 05/26/2011 10:20 05/15/2012 15:21 04/04/2013 12:06  M-SPIKE, % No range found 1.12 NOT DET NOT DET   Results for WYONA, NEILS (MRN [id number]) as of 11/07/2013 14:05  Ref. Range 05/26/2011 10:20 05/15/2012 15:21 04/04/2013 12:06  IgG (Immunoglobin G), Serum Latest Range: [phone] mg/dL 3400 (H) 3820 (H) 5000 (H)  IgA Latest Range: 69-380 mg/dL 570 (H) 626 (H) 766 (  H)    Assessment and Plan:   80 year old with the following issues:  1. Elevated IgG and IgA quantitative immunoglobulin without any clear cut her monoclonal protein. The differential diagnosis was discussed today which include MUGS, Myeloma or Amyloid (all are not likely at this point). This is most likely reactive in nature and I do not see any evidence of end organ damage to suggest a malignant condition. Also the chronic nature of this (at least since 2012) makes this likely myeloma.  I plan to continue to follow this periodically to make sure it is a benign etiology.  2. Anemia: this is most likely related to slight iron deficiency and an element of renal insufficiency.  I encouraged her to continue to take oral iron and I will recheck iron studies with next visit.   3. Follow up: 3-4 months and repeat labs.

## 2013-11-07 NOTE — Progress Notes (Signed)
Checked in new patient with no financial. She was late for appt. I called lab to advise, but then dr said he wanted to see 1st. I informed Maudie Mercury also. I had arrived at Lab, but changed to Dr.

## 2013-11-07 NOTE — Progress Notes (Signed)
Please see consult note.  

## 2013-11-11 ENCOUNTER — Telehealth: Payer: Self-pay | Admitting: Pulmonary Disease

## 2013-11-11 ENCOUNTER — Other Ambulatory Visit: Payer: Self-pay | Admitting: Pulmonary Disease

## 2013-11-11 DIAGNOSIS — I509 Heart failure, unspecified: Secondary | ICD-10-CM

## 2013-11-11 NOTE — Telephone Encounter (Signed)
LMTCB x 1 

## 2013-11-11 NOTE — Telephone Encounter (Signed)
Pt is aware of echo results. Per SN - needs to be referred to Cardiology CHF clinic. Order will be placed.

## 2013-11-13 ENCOUNTER — Telehealth: Payer: Self-pay | Admitting: Pulmonary Disease

## 2013-11-13 ENCOUNTER — Encounter: Payer: Self-pay | Admitting: Gastroenterology

## 2013-11-13 ENCOUNTER — Ambulatory Visit (INDEPENDENT_AMBULATORY_CARE_PROVIDER_SITE_OTHER): Payer: Medicare Other | Admitting: Gastroenterology

## 2013-11-13 VITALS — BP 160/88 | HR 80 | Ht 60.25 in | Wt 134.1 lb

## 2013-11-13 DIAGNOSIS — I890 Lymphedema, not elsewhere classified: Secondary | ICD-10-CM

## 2013-11-13 DIAGNOSIS — I10 Essential (primary) hypertension: Secondary | ICD-10-CM

## 2013-11-13 DIAGNOSIS — R112 Nausea with vomiting, unspecified: Secondary | ICD-10-CM | POA: Insufficient documentation

## 2013-11-13 DIAGNOSIS — D508 Other iron deficiency anemias: Secondary | ICD-10-CM | POA: Diagnosis not present

## 2013-11-13 DIAGNOSIS — D649 Anemia, unspecified: Secondary | ICD-10-CM

## 2013-11-13 DIAGNOSIS — I872 Venous insufficiency (chronic) (peripheral): Secondary | ICD-10-CM

## 2013-11-13 MED ORDER — VITAMIN D (ERGOCALCIFEROL) 1.25 MG (50000 UNIT) PO CAPS: ORAL_CAPSULE | ORAL | Status: DC

## 2013-11-13 MED ORDER — SILVER SULFADIAZINE 1 % EX CREA
TOPICAL_CREAM | CUTANEOUS | Status: DC
Start: 2013-11-13 — End: 2013-12-17

## 2013-11-13 MED ORDER — POTASSIUM CHLORIDE 20 MEQ PO PACK: 20.0000 meq | PACK | Freq: Three times a day (TID) | ORAL | Status: DC

## 2013-11-13 MED ORDER — TORSEMIDE 20 MG PO TABS
ORAL_TABLET | ORAL | Status: DC
Start: 2013-11-13 — End: 2013-11-20

## 2013-11-13 MED ORDER — ALBUTEROL SULFATE HFA 108 (90 BASE) MCG/ACT IN AERS: 2.0000 | INHALATION_SPRAY | RESPIRATORY_TRACT | Status: DC | PRN

## 2013-11-13 MED ORDER — THEOPHYLLINE ER 200 MG PO TB12: ORAL_TABLET | ORAL | Status: DC

## 2013-11-13 NOTE — Assessment & Plan Note (Signed)
Anemia is probably multifactorial including possibly iron deficiency anemia.  Patient has never had a colonoscopy.  Recommendations #1 stool Hemoccults #2 colonoscopy if upper endoscopy is not diagnostic for a GI bleeding source

## 2013-11-13 NOTE — Assessment & Plan Note (Signed)
One year history of intermittent nausea vomiting and abdominal pain.  Symptoms could be due to ulcer or nonulcer dyspepsia.  Note microcytic anemia.  Gastroparesis is also a consideration.  Recommendations #1 upper endoscopy #2 Hemoccults

## 2013-11-13 NOTE — Patient Instructions (Signed)
You have been scheduled for an endoscopy with propofol. Please follow written instructions given to you at your visit today. If you use inhalers (even only as needed), please bring them with you on the day of your procedure. Your physician has requested that you go to www.startemmi.com and enter the access code given to you at your visit today. This web site gives a general overview about your procedure. However, you should still follow specific instructions given to you by our office regarding your preparation for the procedure.  Go to the basement for your Hemoccult kit today

## 2013-11-13 NOTE — Telephone Encounter (Signed)
Called and spoke with lisa and she stated that the pt would like to see Dr. [name] with cardiology at [facility].  Lattie Haw is aware that SN wanted her to see Dr. Arman Filter with the CHF clinic and she is aware that we are waiting for an appt with him.  We will call her once we get this.  She is aware that the refills for meds have been sent in for 1 month supply since the pt will be getting her meds with mail order.  Nothing further is needed.

## 2013-11-13 NOTE — Progress Notes (Signed)
_                                                                                                                History of Present Illness: 80 year old Afro-American female with COPD, benign monoclonal gammopathy, venous stasis of the lower extremities,  referred for evaluation of abdominal pain and vomiting.  He claims over the past year that she has suffered from intermittent nausea with vomiting.  It is not related to eating, per se.  It occurs spontaneously.  She's also having mid abdominal pain.  Pain may improve somewhat with eating.  It is not changed with vomiting.  There has been no change in bowel habits.  She takes iron.  She is unaware of rectal bleeding.  Ultrasound and CT in May, 2014 demonstrated a gallbladder stone.    Past Medical History  Diagnosis Date  . Obstructive chronic bronchitis without exacerbation   . Unspecified essential hypertension   . Unspecified venous (peripheral) insufficiency   . Edema 2005    "since put the glass in them"   . Other abnormal glucose   . Lumbago   . Unspecified nonpsychotic mental disorder   . Unspecified venous (peripheral) insufficiency   . Unspecified vitamin D deficiency   . Anemia, unspecified   . Anxiety   . Arthritis   . Asthma   . CHF (congestive heart failure)   . Depression   . GERD (gastroesophageal reflux disease)    Past Surgical History  Procedure Laterality Date  . Cholecystectomy    . Tonsillectomy    . Cervical spine surgery      x 2  . Partial hysterectomy    . Lumbar disc surgery     family history includes Cancer in her paternal aunt; Diabetes in her mother and paternal grandfather; Heart disease in her maternal aunt. Current Outpatient Prescriptions  Medication Sig Dispense Refill  . albuterol (PROVENTIL HFA) 108 (90 BASE) MCG/ACT inhaler Inhale 2 puffs into the lungs every 4 (four) hours as needed for wheezing.  3 Inhaler  1  . ALPRAZolam (XANAX) 0.5 MG tablet TAKE 1/2 TO 1 TABLET BY  MOUTH THREE TIMES DAILY AS NEEDED FOR NERVES  90 tablet  1  . clotrimazole-betamethasone (LOTRISONE) cream Apply 1 application topically 2 (two) times daily. To her feet  45 g  1  . Ferrous Sulfate Dried 200 (65 FE) MG TABS Take 1 tablet by mouth 2 (two) times daily.  30 tablet  0  . HYDROcodone-acetaminophen (NORCO/VICODIN) 5-325 MG per tablet TAKE ONE TABLET BY MOUTH THREE TIMES DAILY AS NEEDED  90 tablet  1  . hydrOXYzine (ATARAX/VISTARIL) 25 MG tablet Take 1 tablet (25 mg total) by mouth every 4 (four) hours as needed. For itching  50 tablet  1  . Multiple Vitamin (MULTIVITAMIN WITH MINERALS) TABS Take 1 tablet by mouth daily.      . ondansetron (ZOFRAN) 4 MG tablet Take 1 tablet (4 mg total) by mouth every 4 (four) hours as needed for nausea.  30 tablet  1  . potassium chloride (KLOR-CON) 20 MEQ packet Take 20 mEq by mouth 3 (three) times daily.  270 tablet  1  . silver sulfADIAZINE (SILVADENE) 1 % cream Apply daily as directed  85 g  1  . theophylline (THEODUR) 200 MG 12 hr tablet TAKE 1 TABLET BY MOUTH TWICE DAILY  180 tablet  1  . torsemide (DEMADEX) 20 MG tablet TAKE 2 TABLETS BY MOUTH DAILY  180 tablet  1  . vitamin C (ASCORBIC ACID) 500 MG tablet Take 500 mg by mouth 3 (three) times daily.       . Vitamin D, Ergocalciferol, (DRISDOL) 50000 UNITS CAPS capsule TAKE 1 CAPSULE BY MOUTH EVERY 7 DAYS ON SUNDAY OR MONDAY  12 capsule  1   No current facility-administered medications for this visit.   Allergies as of 11/13/2013 - Review Complete 11/13/2013  Allergen Reaction Noted  . Aspirin    . Codeine      reports that she has never smoked. She has never used smokeless tobacco. She reports that she does not drink alcohol or use illicit drugs.     Review of Systems: She has chronic lower extremity lymphedema Pertinent positive and negative review of systems were noted in the above HPI section. All other review of systems were otherwise negative.  Vital signs were reviewed in today's  medical record Physical Exam: General: Elderly female in no acute distress Skin: anicteric Head: Normocephalic and atraumatic Eyes:  sclerae anicteric, EOMI Ears: Normal auditory acuity Mouth: No deformity or lesions Neck: Supple, no masses or thyromegaly Lungs: Clear throughout to auscultation Heart: Regular rate and rhythm; no murmurs, rubs or bruits Abdomen: Soft, non tender and non distended. Nohepatosplenomegaly or hernias noted. Normal Bowel sounds.  There is some fullness in the left lower quadrant without discrete mass Rectal:deferred Musculoskeletal: Symmetrical with no gross deformities  Skin: No lesions on visible extremities Pulses:  Normal pulses noted Extremities: There is severe lymphedema up to her knees with associated skin changes  Neurological: Alert oriented x 4, grossly nonfocal Cervical Nodes:  No significant cervical adenopathy Inguinal Nodes: No significant inguinal adenopathy Psychological:  Alert and cooperative. Normal mood and affect  See Assessment and Plan under Problem List

## 2013-11-20 ENCOUNTER — Encounter: Payer: Self-pay | Admitting: Interventional Cardiology

## 2013-11-20 ENCOUNTER — Other Ambulatory Visit: Payer: Self-pay | Admitting: Pulmonary Disease

## 2013-11-20 ENCOUNTER — Ambulatory Visit (INDEPENDENT_AMBULATORY_CARE_PROVIDER_SITE_OTHER): Payer: Medicare Other | Admitting: Interventional Cardiology

## 2013-11-20 VITALS — BP 166/100 | HR 99 | Ht 60.0 in | Wt 132.0 lb

## 2013-11-20 DIAGNOSIS — R0600 Dyspnea, unspecified: Secondary | ICD-10-CM

## 2013-11-20 DIAGNOSIS — I890 Lymphedema, not elsewhere classified: Secondary | ICD-10-CM

## 2013-11-20 DIAGNOSIS — I2789 Other specified pulmonary heart diseases: Secondary | ICD-10-CM

## 2013-11-20 DIAGNOSIS — I10 Essential (primary) hypertension: Secondary | ICD-10-CM

## 2013-11-20 DIAGNOSIS — R7309 Other abnormal glucose: Secondary | ICD-10-CM

## 2013-11-20 DIAGNOSIS — IMO0002 Reserved for concepts with insufficient information to code with codable children: Secondary | ICD-10-CM

## 2013-11-20 DIAGNOSIS — I5022 Chronic systolic (congestive) heart failure: Secondary | ICD-10-CM

## 2013-11-20 DIAGNOSIS — R0989 Other specified symptoms and signs involving the circulatory and respiratory systems: Secondary | ICD-10-CM

## 2013-11-20 DIAGNOSIS — D649 Anemia, unspecified: Secondary | ICD-10-CM

## 2013-11-20 DIAGNOSIS — I872 Venous insufficiency (chronic) (peripheral): Secondary | ICD-10-CM

## 2013-11-20 DIAGNOSIS — R0609 Other forms of dyspnea: Secondary | ICD-10-CM

## 2013-11-20 MED ORDER — VITAMIN C 500 MG PO TABS: 500.0000 mg | ORAL_TABLET | Freq: Two times a day (BID) | ORAL | Status: DC

## 2013-11-20 MED ORDER — VITAMIN D (ERGOCALCIFEROL) 1.25 MG (50000 UNIT) PO CAPS: ORAL_CAPSULE | ORAL | Status: DC

## 2013-11-20 MED ORDER — POTASSIUM CHLORIDE 20 MEQ PO PACK: 20.0000 meq | PACK | Freq: Three times a day (TID) | ORAL | Status: DC

## 2013-11-20 MED ORDER — LISINOPRIL 10 MG PO TABS: 10.0000 mg | ORAL_TABLET | Freq: Every day | ORAL | Status: DC

## 2013-11-20 MED ORDER — CARVEDILOL 3.125 MG PO TABS: 3.1250 mg | ORAL_TABLET | Freq: Two times a day (BID) | ORAL | Status: DC

## 2013-11-20 MED ORDER — TORSEMIDE 20 MG PO TABS: 40.0000 mg | ORAL_TABLET | Freq: Two times a day (BID) | ORAL | Status: DC

## 2013-11-20 NOTE — Progress Notes (Signed)
Patient ID: [name], female   DOB: Nov 28, 1931, 80 y.o.   MRN: [id number]   Date: 11/20/2013 ID: [name], DOB Dec 12, 1931, MRN [id number] PCP: [name], MD  Reason: Lower extremity edema and heart failure  ASSESSMENT;  1. Systolic heart failure, new, possible acute on chronic. Etiology uncertain. Hypertension and coronary disease are suspect. 2. Chronic bronchitis with use of bronchodilator therapy 3. Pulmonary hypertension, likely secondary to both lung and left heart failure 4. Dyspnea, likely multifactorial related to heart failure and chronic lung disease. We'll get a BNP to help differentiate predominance of heart failure versus lung 4. History of lymphedema and venous insufficiency 5. Severe blood pressure elevation (hypertension), poorly controlled  PLAN:  1. Increase Demadex to 40 mg twice a day 2. Carvedilol 3.125 mg twice a day 3. Lisinopril 10 mg daily 4. Basic metabolic panel and brain nitrate peptide on return visit in one week 5. Return visit in one week 6. Will need an ischemic evaluation once heart failure is under better control 7. Brain natruretic peptide   SUBJECTIVE: [name] is a 80 y.o. female who is referred for management of heart failure. Recent echo demonstrated LVEF of 35-40%. The patient has a long-standing history of lower extremity edema felt secondary to venous insufficiency and lymphedema. Over the past several months worse from the swelling has become more severe and it has been associated orthopnea and dyspnea on exertion. She has in addition had tightness in her chest that at times seems to be precipitated by recumbency. Sitting helps relieve the discomfort. She has significant lower extremity swelling with chronic skin changes on both lower extremities left greater than right. No prior history of heart disease. No prior history of myocardial infarction.   Allergies  Allergen Reactions  . Aspirin     REACTION: upset stomach    . Codeine     REACTION: nausea    Current Outpatient Prescriptions on File Prior to Visit  Medication Sig Dispense Refill  . albuterol (PROVENTIL HFA) 108 (90 BASE) MCG/ACT inhaler Inhale 2 puffs into the lungs every 4 (four) hours as needed for wheezing.  1 Inhaler  0  . ALPRAZolam (XANAX) 0.5 MG tablet TAKE 1/2 TO 1 TABLET BY MOUTH THREE TIMES DAILY AS NEEDED FOR NERVES  90 tablet  1  . clotrimazole-betamethasone (LOTRISONE) cream Apply 1 application topically 2 (two) times daily. To her feet  45 g  1  . Ferrous Sulfate Dried 200 (65 FE) MG TABS Take 1 tablet by mouth 2 (two) times daily.  30 tablet  0  . HYDROcodone-acetaminophen (NORCO/VICODIN) 5-325 MG per tablet TAKE ONE TABLET BY MOUTH THREE TIMES DAILY AS NEEDED  90 tablet  1  . hydrOXYzine (ATARAX/VISTARIL) 25 MG tablet Take 1 tablet (25 mg total) by mouth every 4 (four) hours as needed. For itching  50 tablet  1  . Multiple Vitamin (MULTIVITAMIN WITH MINERALS) TABS Take 1 tablet by mouth daily.      . ondansetron (ZOFRAN) 4 MG tablet Take 1 tablet (4 mg total) by mouth every 4 (four) hours as needed for nausea.  30 tablet  1  . potassium chloride (KLOR-CON) 20 MEQ packet Take 20 mEq by mouth 3 (three) times daily.  90 tablet  0  . silver sulfADIAZINE (SILVADENE) 1 % cream Apply daily as directed  50 g  6  . theophylline (THEODUR) 200 MG 12 hr tablet TAKE 1 TABLET BY MOUTH TWICE DAILY  60 tablet  0  .  torsemide (DEMADEX) 20 MG tablet TAKE 2 TABLETS BY MOUTH DAILY  180 tablet  0  . vitamin C (ASCORBIC ACID) 500 MG tablet Take 500 mg by mouth 3 (three) times daily.       . Vitamin D, Ergocalciferol, (DRISDOL) 50000 UNITS CAPS capsule TAKE 1 CAPSULE BY MOUTH EVERY 7 DAYS ON SUNDAY OR MONDAY  4 capsule  0   No current facility-administered medications on file prior to visit.    Past Medical History  Diagnosis Date  . Obstructive chronic bronchitis without exacerbation   . Unspecified essential hypertension   . Unspecified venous  (peripheral) insufficiency   . Edema 2005    "since put the glass in them"   . Other abnormal glucose   . Lumbago   . Unspecified nonpsychotic mental disorder   . Unspecified venous (peripheral) insufficiency   . Unspecified vitamin D deficiency   . Anemia, unspecified   . Anxiety   . Arthritis   . Asthma   . CHF (congestive heart failure)   . Depression   . GERD (gastroesophageal reflux disease)     Past Surgical History  Procedure Laterality Date  . Cholecystectomy    . Tonsillectomy    . Cervical spine surgery      x 2  . Partial hysterectomy    . Lumbar disc surgery      History   Social History  . Marital Status: Married    Spouse Name: N/A    Number of Children: 0  . Years of Education: N/A   Occupational History  . retired    Social History Main Topics  . Smoking status: Never Smoker   . Smokeless tobacco: Never Used  . Alcohol Use: No  . Drug Use: No  . Sexual Activity: Not on file   Other Topics Concern  . Not on file   Social History Narrative  . No narrative on file    Family History  Problem Relation Age of Onset  . Diabetes Mother   . Diabetes Paternal Grandfather     entire family on both sides  . Heart disease Maternal Aunt     entire family of both sides  . Cancer Paternal Aunt     type unknown    ROS: Weight loss, poor appetite, difficulty sleeping, abdominal swelling, and leg pain. She denies syncope. No neurological complaints.. Other systems negative for complaints.  OBJECTIVE: BP 166/100  Pulse 99  Ht 5' (1.524 m)  Wt 132 lb (59.875 kg)  BMI 25.78 kg/m2,  General: No acute distress, elderly and frail HEENT: normal pallor of conjunctiva no jaundice Neck: JVD severe elevation even sitting at 90. Carotids without bruits with 2+ upstroke Chest: Clear with the exception of basilar reduction in breath sounds on the right Cardiac: Murmur: Soft 1-2 of 6 systolic murmur left lower sternal border. Gallop: S4 gallop increasing with  inspiration suggestive of right ventricular dysfunction.[name] Rhythm: Regular. Other: Normal Abdomen: Bruit: Absent. Pulsation: Absent . There is abdominal distention and the possibility of ascites Extremities: Edema: 2-3+ bilateral with chronic skin changes left leg greater than right. The edema extends to the knee and is worse in the region of the ankles.. Pulses: Not palpable Neuro: No focal deficit Psych: Normal  ECG: Right axis deviation biatrial abnormality poor R-wave progression  ECHOCARDIOGRAPHY: January 2015 Study Conclusions  - Left ventricle: The cavity size was normal. Wall thickness was normal. Systolic function was moderately reduced. The estimated ejection fraction was in the range of  35% to 40%. There is septal wall akinesis/severe hypokinesis. Otherwisethere is diffusehypokinesis of moderate to severe degree. Features are consistent with a pseudonormal left ventricular filling pattern, with concomitant abnormal relaxation and increased filling pressure (grade 2 diastolic dysfunction). - Atrial septum: No defect or patent foramen ovale was identified. - Pulmonary arteries: Systolic pressure was mildly to moderately increased. PA peak pressure: 61mm Hg (S).

## 2013-11-20 NOTE — Patient Instructions (Signed)
Your physician has recommended you make the following change in your medication:   1. Increase Demadex to 40mg  twice daily 2. Start Carvedilol 3.125mg  twice daily 3. Start Lisinopril 10mg  once daily  Your physician recommends that you return for lab work in: 7-10 days, BMET, BNP  Your physician recommends that you schedule a follow-up appointment in: 7-10 days with Dr. [name]

## 2013-11-21 DIAGNOSIS — IMO0001 Reserved for inherently not codable concepts without codable children: Secondary | ICD-10-CM | POA: Diagnosis not present

## 2013-11-21 DIAGNOSIS — I890 Lymphedema, not elsewhere classified: Secondary | ICD-10-CM | POA: Diagnosis not present

## 2013-11-28 ENCOUNTER — Other Ambulatory Visit: Payer: Medicare Other

## 2013-11-28 ENCOUNTER — Ambulatory Visit (INDEPENDENT_AMBULATORY_CARE_PROVIDER_SITE_OTHER): Payer: Medicare Other | Admitting: Nurse Practitioner

## 2013-11-28 ENCOUNTER — Encounter: Payer: Self-pay | Admitting: Nurse Practitioner

## 2013-11-28 VITALS — BP 140/70 | HR 71 | Ht 60.0 in | Wt 129.8 lb

## 2013-11-28 DIAGNOSIS — R0989 Other specified symptoms and signs involving the circulatory and respiratory systems: Secondary | ICD-10-CM

## 2013-11-28 DIAGNOSIS — I5022 Chronic systolic (congestive) heart failure: Secondary | ICD-10-CM

## 2013-11-28 DIAGNOSIS — R0600 Dyspnea, unspecified: Secondary | ICD-10-CM

## 2013-11-28 DIAGNOSIS — R0609 Other forms of dyspnea: Secondary | ICD-10-CM | POA: Diagnosis not present

## 2013-11-28 LAB — BASIC METABOLIC PANEL
BUN: 31 mg/dL — ABNORMAL HIGH (ref 6–23)
CO2: 27 mEq/L (ref 19–32)
Calcium: 9.2 mg/dL (ref 8.4–10.5)
Chloride: 102 mEq/L (ref 96–112)
Creatinine, Ser: 1.6 mg/dL — ABNORMAL HIGH (ref 0.4–1.2)
GFR: 39.63 mL/min — ABNORMAL LOW (ref >60.00)
Glucose, Bld: 116 mg/dL — ABNORMAL HIGH (ref 70–99)
Potassium: 3.6 mEq/L (ref 3.5–5.1)
Sodium: 136 mEq/L (ref 135–145)

## 2013-11-28 LAB — CBC
HCT: 33.5 % — ABNORMAL LOW (ref 36.0–46.0)
Hemoglobin: 10.6 g/dL — ABNORMAL LOW (ref 12.0–15.0)
MCHC: 31.5 g/dL (ref 30.0–36.0)
MCV: 84.2 fl (ref 78.0–100.0)
Platelets: 250 10*3/uL (ref 150.0–400.0)
RBC: 3.98 Mil/uL (ref 3.87–5.11)
RDW: 18.8 % — ABNORMAL HIGH (ref 11.5–14.6)
WBC: 3.8 10*3/uL — ABNORMAL LOW (ref 4.5–10.5)

## 2013-11-28 LAB — BRAIN NATRIURETIC PEPTIDE: Pro B Natriuretic peptide (BNP): 735 pg/mL — ABNORMAL HIGH (ref 0.0–100.0)

## 2013-11-28 MED ORDER — CARVEDILOL 6.25 MG PO TABS: 6.2500 mg | ORAL_TABLET | Freq: Two times a day (BID) | ORAL | Status: DC

## 2013-11-28 NOTE — Progress Notes (Addendum)
[name] Date of Birth: [birth year] Medical Record [id number]  History of Present Illness: [name] is seen back today for a one week check. Seen for Dr. [name]. Has new systolic heart failure - etiology uncertain - possible HTN and CAD suspected. Other issues include chronic bronchitis, pulmonary HTN, lymphedema and venous insufficiency and severe HTN. She tells me she has diet controlled DM. She was noted to be anemic on past lab work.   Seen last week as a new patient - Demadex was increased (and apparently she can only take brand name), Coreg and Lisinopril were started. Recent echo has shown at EF of 35 to 40%.   Comes back today. Here with 2 family members. She is a little better. Still with some shortness of breath. Swelling has improved but still with edema. Has her legs wrapped. Was to start some type of massage therapy next week. For EGD next week. No reason as to why she is anemic. Some "fullness" in her chest and heavy at times - this will happen with rest and with exertion. Sounds like she is trying to watch her salt. Does not have scales. Does not elevate her legs. Also with reported asthma.   Current Outpatient Prescriptions  Medication Sig Dispense Refill  . albuterol (PROVENTIL HFA) 108 (90 BASE) MCG/ACT inhaler Inhale 2 puffs into the lungs every 4 (four) hours as needed for wheezing.  1 Inhaler  0  . ALPRAZolam (XANAX) 0.5 MG tablet TAKE 1/2 TO 1 TABLET BY MOUTH THREE TIMES DAILY AS NEEDED FOR NERVES  90 tablet  1  . carvedilol (COREG) 3.125 MG tablet Take 1 tablet (3.125 mg total) by mouth 2 (two) times daily.  60 tablet  3  . clotrimazole-betamethasone (LOTRISONE) cream Apply 1 application topically 2 (two) times daily. To her feet  45 g  1  . Ferrous Sulfate Dried 200 (65 FE) MG TABS Take 1 tablet by mouth 2 (two) times daily.  30 tablet  0  . HYDROcodone-acetaminophen (NORCO/VICODIN) 5-325 MG per tablet TAKE ONE TABLET BY MOUTH THREE TIMES DAILY AS NEEDED  90 tablet   1  . hydrOXYzine (ATARAX/VISTARIL) 25 MG tablet Take 1 tablet (25 mg total) by mouth every 4 (four) hours as needed. For itching  50 tablet  1  . lisinopril (PRINIVIL,ZESTRIL) 10 MG tablet Take 1 tablet (10 mg total) by mouth daily.  30 tablet  3  . Multiple Vitamin (MULTIVITAMIN WITH MINERALS) TABS Take 1 tablet by mouth daily.      . ondansetron (ZOFRAN) 4 MG tablet Take 1 tablet (4 mg total) by mouth every 4 (four) hours as needed for nausea.  30 tablet  1  . potassium chloride (KLOR-CON) 20 MEQ packet Take 20 mEq by mouth 3 (three) times daily.  90270 tablet  1  . silver sulfADIAZINE (SILVADENE) 1 % cream Apply daily as directed  50 g  6  . theophylline (THEODUR) 200 MG 12 hr tablet TAKE 1 TABLET BY MOUTH TWICE DAILY  60 tablet  0  . torsemide (DEMADEX) 20 MG tablet Take 2 tablets (40 mg total) by mouth 2 (two) times daily.  120 tablet  1  . vitamin C (ASCORBIC ACID) 500 MG tablet Take 1 tablet (500 mg total) by mouth 2 (two) times daily.  180 tablet  1  . Vitamin D, Ergocalciferol, (DRISDOL) 50000 UNITS CAPS capsule TAKE 1 CAPSULE BY MOUTH EVERY 7 DAYS ON SUNDAY OR MONDAY  12 capsule  1   No  current facility-administered medications for this visit.    Allergies  Allergen Reactions  . Aspirin     REACTION: upset stomach  . Codeine     REACTION: nausea    Past Medical History  Diagnosis Date  . Obstructive chronic bronchitis without exacerbation   . Unspecified essential hypertension   . Unspecified venous (peripheral) insufficiency   . Edema 2005    "since put the glass in them"   . Other abnormal glucose   . Lumbago   . Unspecified nonpsychotic mental disorder   . Unspecified venous (peripheral) insufficiency   . Unspecified vitamin D deficiency   . Anemia, unspecified   . Anxiety   . Arthritis   . Asthma   . Systolic heart failure     EF 35 to 40% per echo January 2015  . Depression   . GERD (gastroesophageal reflux disease)     Past Surgical History  Procedure  Laterality Date  . Cholecystectomy    . Tonsillectomy    . Cervical spine surgery      x 2  . Partial hysterectomy    . Lumbar disc surgery      History  Smoking status  . Never Smoker   Smokeless tobacco  . Never Used    History  Alcohol Use No    Family History  Problem Relation Age of Onset  . Diabetes Mother   . Diabetes Paternal Grandfather     entire family on both sides  . Heart disease Maternal Aunt     entire family of both sides  . Cancer Paternal Aunt     type unknown    Review of Systems: The review of systems is per the HPI.  All other systems were reviewed and are negative.  Physical Exam: BP 140/70  Pulse 71  Ht 5' (1.524 m)  Wt 129 lb 12.8 oz (58.877 kg)  BMI 25.35 kg/m2  SpO2 100% Patient is very pleasant and in no acute distress. Weight down 3 pounds. Skin is warm and dry. Color is normal.  HEENT is unremarkable. Normocephalic/atraumatic. PERRL. Sclera are nonicteric. Neck is supple. No masses. No JVD. Lungs are fairly clear few rales in the bases. Cardiac exam shows a regular rate and rhythm. Abdomen is soft. Extremities are quite full with 2+ edema. Legs are wrapped. Gait and ROM are intact. No gross neurologic deficits noted.  Wt Readings from Last 3 Encounters:  11/28/13 129 lb 12.8 oz (58.877 kg)  11/20/13 132 lb (59.875 kg)  11/13/13 134 lb 2 oz (60.839 kg)     LABORATORY DATA: PENDING  Lab Results  Component Value Date   WBC 4.8 10/17/2013   HGB 8.8 Repeated and verified X2.* 10/17/2013   HCT 26.6 Repeated and verified X2.* 10/17/2013   PLT 431.0* 10/17/2013   GLUCOSE 82 10/17/2013   CHOL 182 01/28/2010   TRIG 75.0 01/28/2010   HDL 57.90 01/28/2010   LDLCALC 109* 01/28/2010   ALT 14 10/17/2013   AST 22 10/17/2013   NA 138 10/17/2013   K 3.6 10/17/2013   CL 105 10/17/2013   CREATININE 1.3* 10/17/2013   BUN 21 10/17/2013   CO2 27 10/17/2013   TSH 0.79 04/04/2013   HGBA1C 5.6 04/04/2013   Echo Study Conclusions from January 2015  -  Left ventricle: The cavity size was normal. Wall thickness was normal. Systolic function was moderately reduced. The estimated ejection fraction was in the range of 35% to 40%. There is septal wall akinesis/severe hypokinesis.  Otherwisethere is diffusehypokinesis of moderate to severe degree. Features are consistent with a pseudonormal left ventricular filling pattern, with concomitant abnormal relaxation and increased filling pressure (grade 2 diastolic dysfunction). - Atrial septum: No defect or patent foramen ovale was identified. - Pulmonary arteries: Systolic pressure was mildly to moderately increased. PA peak pressure: 62mm Hg (S).    Assessment / Plan: 1. Systolic HF - uncertain etiology - will arrange for Myoview to rule out CAD. Recheck BMET and BNP today along with CBC. Coreg is increased to 6.25 mg BID. Will try to titrate medicines as much as her BP/heart rate will allow - probably needs her anemia addressed as well. I have left her on her current dose of diuretic and ACE for now.   2. HTN - Coreg is increased today.   3. Anemia - not clear as to what has been done - will recheck her CBC - she tells me she never had colonoscopy. She is for EGD next week due to GI complaints which may very well be heart failure.  Will see her back after the Myoview. Check labs today. Coreg is increased today.   Patient is agreeable to this plan and will call if any problems develop in the interim.   Burtis Junes, RN, ANP-C [place] [address] [place], [place]  [id number] [phone]   Addendum: Upon chart review - patient has seen Dr. Alen Blew with hematology - on iron therapy. Family did not seem to be aware of this and did not share this information with me.

## 2013-11-28 NOTE — Patient Instructions (Addendum)
We need to recheck your labs today  Stay on your current medicines but we are going to increase the Coreg to 6.25 mg two times a day - take 2 of your 3.125 mg tabs twice a day to use those up.  Call us if you need a prescription sent in for the 6.25 mg tab  See Dr. [name] in 2 weeks  We will get a stress test [name])  Really restrict your salt  Weigh daily  Hold off on the treatment for your legs until you are seen back  Call the [place] office at [phone] if you have any questions, problems or concerns.     1.5 Gram Low Sodium Diet A 1.5 gram sodium diet restricts the amount of sodium in the diet to no more than 1.5 g or 1500 mg daily. The American Heart Association recommends Americans over the age of 30 to consume no more than 1500 mg of sodium each day to reduce the risk of developing high blood pressure. Research also shows that limiting sodium may reduce heart attack and stroke risk. Many foods contain sodium for flavor and sometimes as a preservative. When the amount of sodium in a diet needs to be low, it is important to know what to look for when choosing foods and drinks. The following includes some information and guidelines to help make it easier for you to adapt to a low sodium diet. QUICK TIPS  Do not add salt to food.  Avoid convenience items and fast food.  Choose unsalted snack foods.  Buy lower sodium products, often labeled as "lower sodium" or "no salt added."  Check food labels to learn how much sodium is in 1 serving.  When eating at a restaurant, ask that your food be prepared with less salt or none, if possible. READING FOOD LABELS FOR SODIUM INFORMATION The nutrition facts label is a good place to find how much sodium is in foods. Look for products with no more than 400 mg of sodium per serving. Remember that 1.5 g = 1500 mg. The food label may also list foods as:  Sodium-free: Less than 5 mg in a serving.  Very low  sodium: 35 mg or less in a serving.  Low-sodium: 140 mg or less in a serving.  Light in sodium: 50% less sodium in a serving. For example, if a food that usually has 300 mg of sodium is changed to become light in sodium, it will have 150 mg of sodium.  Reduced sodium: 25% less sodium in a serving. For example, if a food that usually has 400 mg of sodium is changed to reduced sodium, it will have 300 mg of sodium. CHOOSING FOODS Grains  Avoid: Salted crackers and snack items. Some cereals, including instant hot cereals. Bread stuffing and biscuit mixes. Seasoned rice or pasta mixes.  Choose: Unsalted snack items. Low-sodium cereals, oats, puffed wheat and rice, shredded wheat. English muffins and bread. Pasta. Meats  Avoid: Salted, canned, smoked, spiced, pickled meats, including fish and poultry. Bacon, ham, sausage, cold cuts, hot dogs, anchovies.  Choose: Low-sodium canned tuna and salmon. Fresh or frozen meat, poultry, and fish. Dairy  Avoid: Processed cheese and spreads. Cottage cheese. Buttermilk and condensed milk. Regular cheese.  Choose: Milk. Low-sodium cottage cheese. Yogurt. Sour cream. Low-sodium cheese. Fruits and Vegetables  Avoid: Regular canned vegetables. Regular canned tomato sauce and paste. Frozen vegetables in sauces. Olives. Angie Fava. Relishes. Sauerkraut.  Choose: Low-sodium canned vegetables. Low-sodium tomato  sauce and paste. Frozen or fresh vegetables. Fresh and frozen fruit. Condiments  Avoid: Canned and packaged gravies. Worcestershire sauce. Tartar sauce. Barbecue sauce. Soy sauce. Steak sauce. Ketchup. Onion, garlic, and table salt. Meat flavorings and tenderizers.  Choose: Fresh and dried herbs and spices. Low-sodium varieties of mustard and ketchup. Lemon juice. Tabasco sauce. Horseradish. SAMPLE 1.5 GRAM SODIUM MEAL PLAN Breakfast / Sodium (mg)  1 cup low-fat milk / 143 mg  1 whole-wheat English muffin / 240 mg  1 tbs heart-healthy margarine /  153 mg  1 hard-boiled egg / 139 mg  1 small orange / 0 mg Lunch / Sodium (mg)  1 cup raw carrots / 76 mg  2 tbs no salt added peanut butter / 5 mg  2 slices whole-wheat bread / 270 mg  1 tbs jelly / 6 mg   cup red grapes / 2 mg Dinner / Sodium (mg)  1 cup whole-wheat pasta / 2 mg  1 cup low-sodium tomato sauce / 73 mg  3 oz lean ground beef / 57 mg  1 small side salad (1 cup raw spinach leaves,  cup cucumber,  cup yellow bell pepper) with 1 tsp olive oil and 1 tsp red wine vinegar / 25 mg Snack / Sodium (mg)  1 container low-fat vanilla yogurt / 107 mg  3 graham cracker squares / 127 mg Nutrient Analysis  Calories: 1745  Protein: 75 g  Carbohydrate: 237 g  Fat: 57 g  Sodium: 1425 mg Document Released: 10/16/2005 Document Revised: 01/08/2012 Document Reviewed: 01/17/2010 [employer] Patient Information 2014 [place], [place].

## 2013-12-01 ENCOUNTER — Other Ambulatory Visit: Payer: Self-pay

## 2013-12-01 DIAGNOSIS — R0602 Shortness of breath: Secondary | ICD-10-CM

## 2013-12-01 DIAGNOSIS — R0600 Dyspnea, unspecified: Secondary | ICD-10-CM

## 2013-12-01 DIAGNOSIS — I10 Essential (primary) hypertension: Secondary | ICD-10-CM

## 2013-12-01 DIAGNOSIS — I5022 Chronic systolic (congestive) heart failure: Secondary | ICD-10-CM

## 2013-12-01 MED ORDER — CARVEDILOL 3.125 MG PO TABS: 6.2500 mg | ORAL_TABLET | Freq: Two times a day (BID) | ORAL | Status: DC

## 2013-12-01 MED ORDER — LISINOPRIL 10 MG PO TABS: 10.0000 mg | ORAL_TABLET | Freq: Every day | ORAL | Status: DC

## 2013-12-01 MED ORDER — TORSEMIDE 20 MG PO TABS: 40.0000 mg | ORAL_TABLET | Freq: Two times a day (BID) | ORAL | Status: DC

## 2013-12-01 NOTE — Telephone Encounter (Signed)
Received written rqst form pt

## 2013-12-03 ENCOUNTER — Other Ambulatory Visit: Payer: Medicare Other

## 2013-12-03 ENCOUNTER — Ambulatory Visit: Payer: Medicare Other | Admitting: Interventional Cardiology

## 2013-12-04 ENCOUNTER — Telehealth: Payer: Self-pay | Admitting: Interventional Cardiology

## 2013-12-04 NOTE — Telephone Encounter (Signed)
New message  Wannetta Sender is Lipoma therapist with [facility] in [place]. She would like to speak with Dr [name] to coordinate care for patient. Please call and advise.

## 2013-12-04 NOTE — Telephone Encounter (Signed)
returned Trish call.she was gone for the day. left message for her to return a call to the office tomorrow

## 2013-12-05 ENCOUNTER — Telehealth: Payer: Self-pay | Admitting: *Deleted

## 2013-12-05 ENCOUNTER — Telehealth: Payer: Self-pay

## 2013-12-05 NOTE — Telephone Encounter (Signed)
Message copied by Algernon Huxley on Fri Dec 05, 2013 10:27 AM ------      Message from: Erskine Emery D      Created: Thu Dec 04, 2013 11:10 PM      Regarding: FW: Cardiac pt for EGD       Vaughan Basta, please defer EGD.      ----- Message -----         From: Osvaldo Angst, CRNA         Sent: 12/04/2013   7:00 AM           To: Inda Castle, MD, Oda Kilts, CMA      Subject: Cardiac pt for EGD                                       Doc,            This pt was seen by Cardiology on 11/13/13 for heart failure.  They have not completed their w/u.  She is scheduled for an EGD on 2/12.      But is also scheduled for a myoview stress on 2/16.  Please consider postponing her EGD until [facility] has evaluated her myoview.            Thank you for your consideration in this matter.            Best regards,            Osvaldo Angst       ------

## 2013-12-05 NOTE — Telephone Encounter (Signed)
Message copied by Oda Kilts on Fri Dec 05, 2013  8:53 AM ------      Message from: Erskine Emery D      Created: Thu Dec 04, 2013 11:11 PM      Regarding: RE: Cardiac pt for EGD       Please defer EGD until after strees myoview      ----- Message -----         From: Oda Kilts, CMA         Sent: 12/04/2013   9:07 AM           To: Osvaldo Angst, CRNA, Inda Castle, MD      Subject: RE: Cardiac pt for EGD                                   Dr Deatra Ina Please advise      ----- Message -----         From: Osvaldo Angst, CRNA         Sent: 12/04/2013   7:00 AM           To: Inda Castle, MD, Oda Kilts, CMA      Subject: Cardiac pt for EGD                                       Doc,            This pt was seen by Cardiology on 11/13/13 for heart failure.  They have not completed their w/u.  She is scheduled for an EGD on 2/12.      But is also scheduled for a myoview stress on 2/16.  Please consider postponing her EGD until [facility] has evaluated her myoview.            Thank you for your consideration in this matter.            Best regards,            Osvaldo Angst             ------

## 2013-12-05 NOTE — Telephone Encounter (Signed)
Patient aware and will call us back after cardiac work up   cancelled EGD

## 2013-12-05 NOTE — Telephone Encounter (Signed)
recieved call from pt Lymphodemia therapist.she would like to start Lymphodemia therapy with medical compression bandages.she would start conservatively with pt left lower leg.pt adv per Cecille Rubin G.,NP pt was to wait to complete cardica testing before starting therapy.Wannetta Sender would like for [place] to let her know when she can begin. Adv her would probably be after 12/18/13 pt has upcoming lexiscan, f/u with Dr.Smith. She verbalized understanding. Trish office info is as follows:  [place] [phone]

## 2013-12-05 NOTE — Telephone Encounter (Signed)
EGD cancelled. Pt to call back when cardiac workup complete.

## 2013-12-10 ENCOUNTER — Other Ambulatory Visit: Payer: Self-pay | Admitting: *Deleted

## 2013-12-10 DIAGNOSIS — I5022 Chronic systolic (congestive) heart failure: Secondary | ICD-10-CM

## 2013-12-10 DIAGNOSIS — R0600 Dyspnea, unspecified: Secondary | ICD-10-CM

## 2013-12-10 MED ORDER — TORSEMIDE 20 MG PO TABS: 40.0000 mg | ORAL_TABLET | Freq: Two times a day (BID) | ORAL | Status: DC

## 2013-12-10 MED ORDER — CARVEDILOL 3.125 MG PO TABS: 6.2500 mg | ORAL_TABLET | Freq: Two times a day (BID) | ORAL | Status: DC

## 2013-12-10 MED ORDER — LISINOPRIL 10 MG PO TABS: 10.0000 mg | ORAL_TABLET | Freq: Every day | ORAL | Status: DC

## 2013-12-11 ENCOUNTER — Encounter: Payer: Medicare Other | Admitting: Gastroenterology

## 2013-12-12 ENCOUNTER — Other Ambulatory Visit: Payer: Self-pay | Admitting: Pulmonary Disease

## 2013-12-15 ENCOUNTER — Encounter (HOSPITAL_COMMUNITY): Payer: Medicare Other

## 2013-12-17 ENCOUNTER — Other Ambulatory Visit: Payer: Self-pay | Admitting: Pulmonary Disease

## 2013-12-17 DIAGNOSIS — I890 Lymphedema, not elsewhere classified: Secondary | ICD-10-CM

## 2013-12-17 DIAGNOSIS — I10 Essential (primary) hypertension: Secondary | ICD-10-CM

## 2013-12-17 DIAGNOSIS — I872 Venous insufficiency (chronic) (peripheral): Secondary | ICD-10-CM

## 2013-12-17 MED ORDER — ONDANSETRON HCL 4 MG PO TABS: 4.0000 mg | ORAL_TABLET | ORAL | Status: DC | PRN

## 2013-12-17 MED ORDER — ALPRAZOLAM 0.5 MG PO TABS: ORAL_TABLET | ORAL | Status: DC

## 2013-12-17 MED ORDER — HYDROXYZINE HCL 25 MG PO TABS: 25.0000 mg | ORAL_TABLET | ORAL | Status: DC | PRN

## 2013-12-17 MED ORDER — VITAMIN D (ERGOCALCIFEROL) 1.25 MG (50000 UNIT) PO CAPS: ORAL_CAPSULE | ORAL | Status: DC

## 2013-12-17 MED ORDER — POTASSIUM CHLORIDE 20 MEQ PO PACK: 20.0000 meq | PACK | Freq: Three times a day (TID) | ORAL | Status: DC

## 2013-12-17 MED ORDER — THEOPHYLLINE ER 200 MG PO TB12: ORAL_TABLET | ORAL | Status: DC

## 2013-12-17 MED ORDER — ALBUTEROL SULFATE HFA 108 (90 BASE) MCG/ACT IN AERS: 2.0000 | INHALATION_SPRAY | RESPIRATORY_TRACT | Status: DC | PRN

## 2013-12-17 MED ORDER — SILVER SULFADIAZINE 1 % EX CREA: TOPICAL_CREAM | CUTANEOUS | Status: DC

## 2013-12-17 MED ORDER — CLOTRIMAZOLE-BETAMETHASONE 1-0.05 % EX CREA: 1.0000 "application " | TOPICAL_CREAM | Freq: Two times a day (BID) | CUTANEOUS | Status: DC

## 2013-12-18 ENCOUNTER — Other Ambulatory Visit: Payer: Medicare Other

## 2013-12-18 ENCOUNTER — Ambulatory Visit: Payer: Medicare Other | Admitting: Nurse Practitioner

## 2013-12-18 ENCOUNTER — Ambulatory Visit: Payer: Medicare Other | Admitting: Interventional Cardiology

## 2013-12-19 ENCOUNTER — Encounter (HOSPITAL_COMMUNITY): Payer: Medicare Other

## 2013-12-23 ENCOUNTER — Ambulatory Visit: Payer: Medicare Other | Admitting: Physician Assistant

## 2013-12-24 ENCOUNTER — Other Ambulatory Visit: Payer: Self-pay | Admitting: Pulmonary Disease

## 2013-12-25 NOTE — Telephone Encounter (Signed)
PATIENT NOT BILLED SAME DAY CX FEE/YF °

## 2013-12-26 ENCOUNTER — Telehealth: Payer: Self-pay | Admitting: Pulmonary Disease

## 2013-12-26 NOTE — Telephone Encounter (Signed)
Called [facility] and spoke with jill and she stated that all of the rx have been received and nothing further is needed.

## 2013-12-31 ENCOUNTER — Other Ambulatory Visit: Payer: Self-pay

## 2013-12-31 DIAGNOSIS — I872 Venous insufficiency (chronic) (peripheral): Secondary | ICD-10-CM

## 2013-12-31 DIAGNOSIS — R0600 Dyspnea, unspecified: Secondary | ICD-10-CM

## 2013-12-31 DIAGNOSIS — I890 Lymphedema, not elsewhere classified: Secondary | ICD-10-CM

## 2013-12-31 DIAGNOSIS — I5022 Chronic systolic (congestive) heart failure: Secondary | ICD-10-CM

## 2013-12-31 DIAGNOSIS — I10 Essential (primary) hypertension: Secondary | ICD-10-CM

## 2013-12-31 MED ORDER — TORSEMIDE 20 MG PO TABS: 40.0000 mg | ORAL_TABLET | Freq: Two times a day (BID) | ORAL | Status: DC

## 2013-12-31 MED ORDER — CARVEDILOL 6.25 MG PO TABS: 6.2500 mg | ORAL_TABLET | Freq: Two times a day (BID) | ORAL | Status: DC

## 2013-12-31 MED ORDER — LISINOPRIL 10 MG PO TABS: 10.0000 mg | ORAL_TABLET | Freq: Every day | ORAL | Status: DC

## 2014-01-05 ENCOUNTER — Ambulatory Visit (HOSPITAL_COMMUNITY): Payer: Medicare Other | Attending: Cardiology | Admitting: Radiology

## 2014-01-05 ENCOUNTER — Encounter: Payer: Self-pay | Admitting: Cardiology

## 2014-01-05 VITALS — BP 136/80 | HR 60 | Ht 60.0 in | Wt 130.0 lb

## 2014-01-05 DIAGNOSIS — R0989 Other specified symptoms and signs involving the circulatory and respiratory systems: Secondary | ICD-10-CM | POA: Insufficient documentation

## 2014-01-05 DIAGNOSIS — R079 Chest pain, unspecified: Secondary | ICD-10-CM | POA: Diagnosis not present

## 2014-01-05 DIAGNOSIS — E785 Hyperlipidemia, unspecified: Secondary | ICD-10-CM | POA: Insufficient documentation

## 2014-01-05 DIAGNOSIS — Z01810 Encounter for preprocedural cardiovascular examination: Secondary | ICD-10-CM | POA: Diagnosis not present

## 2014-01-05 DIAGNOSIS — E119 Type 2 diabetes mellitus without complications: Secondary | ICD-10-CM | POA: Diagnosis not present

## 2014-01-05 DIAGNOSIS — R002 Palpitations: Secondary | ICD-10-CM | POA: Diagnosis not present

## 2014-01-05 DIAGNOSIS — R0602 Shortness of breath: Secondary | ICD-10-CM | POA: Insufficient documentation

## 2014-01-05 DIAGNOSIS — I5022 Chronic systolic (congestive) heart failure: Secondary | ICD-10-CM

## 2014-01-05 DIAGNOSIS — R0600 Dyspnea, unspecified: Secondary | ICD-10-CM

## 2014-01-05 DIAGNOSIS — Z8249 Family history of ischemic heart disease and other diseases of the circulatory system: Secondary | ICD-10-CM | POA: Insufficient documentation

## 2014-01-05 DIAGNOSIS — J45909 Unspecified asthma, uncomplicated: Secondary | ICD-10-CM | POA: Insufficient documentation

## 2014-01-05 DIAGNOSIS — R0609 Other forms of dyspnea: Secondary | ICD-10-CM | POA: Diagnosis not present

## 2014-01-05 DIAGNOSIS — I5020 Unspecified systolic (congestive) heart failure: Secondary | ICD-10-CM | POA: Diagnosis not present

## 2014-01-05 DIAGNOSIS — I10 Essential (primary) hypertension: Secondary | ICD-10-CM | POA: Diagnosis not present

## 2014-01-05 MED ORDER — ADENOSINE (DIAGNOSTIC) 3 MG/ML IV SOLN
0.5600 mg/kg | Freq: Once | INTRAVENOUS | Status: AC
  Administered 2014-01-05: 33 mg via INTRAVENOUS

## 2014-01-05 MED ORDER — TECHNETIUM TC 99M SESTAMIBI GENERIC - CARDIOLITE
30.0000 | Freq: Once | INTRAVENOUS | Status: AC | PRN
  Administered 2014-01-05: 30 via INTRAVENOUS

## 2014-01-05 MED ORDER — TECHNETIUM TC 99M SESTAMIBI GENERIC - CARDIOLITE
10.0000 | Freq: Once | INTRAVENOUS | Status: AC | PRN
  Administered 2014-01-05: 10 via INTRAVENOUS

## 2014-01-05 NOTE — Progress Notes (Signed)
[name] [address] [place], [place] [address] [phone]    Cardiology Nuclear Med Study  [name] is a 80 y.o. female     MRN : [id number]     DOB: 1932/05/22  Procedure Date: 01/05/2014  Nuclear Med Background Indication for Stress Test:  Evaluation for Ischemia, and Pending Cardiac Clearance for Endoscopy by Dr Erskine Emery, MD History:  No known CAD, Echo 2015 EF 99-83% (systolic HF), Asthma Cardiac Risk Factors: Family History - CAD, Hypertension, Lipids and NIDDM  Symptoms:  Chest Pain, DOE, Palpitations and SOB   Nuclear Pre-Procedure Caffeine/Decaff Intake:  None > 12 hrs NPO After: 5:00pm   Lungs:  clear O2 Sat: 98% on room air. IV 0.9% NS with Angio Cath:  22g  IV Site: R Forearm x 1, tolerated well IV Started by:  Irven Baltimore, RN  Chest Size (in):  38 Cup Size: B  Height: 5' (1.524 m)  Weight:  130 lb (58.968 kg)  BMI:  Body mass index is 25.39 kg/(m^2). Tech Comments:  Held Theodur x 72 hrs., and  held Coreg this am.    Nuclear Med Study 1 or 2 day study: 1 day  Stress Test Type:  Adenosine  Reading MD: N/A  Order Authorizing Provider:  Wyvonnia Lora III, and Truitt Merle, NP  Resting Radionuclide: Technetium 39m Sestamibi  Resting Radionuclide Dose: 10.8 mCi   Stress Radionuclide:  Technetium 26m Sestamibi  Stress Radionuclide Dose: 33.0 mCi           Stress Protocol Rest HR: 60 Stress HR: 72  Rest BP: 136/80 Stress BP: 112/66  Exercise Time (min): n/a METS: n/a           Dose of Adenosine (mg):  33.1 mg Dose of Lexiscan: n/a mg  Dose of Atropine (mg): n/a Dose of Dobutamine: n/a mcg/kg/min (at max HR)  Stress Test Technologist: Glade Lloyd, BS-ES  Nuclear Technologist:  Charlton Amor, CNMT     Rest Procedure:  Myocardial perfusion imaging was performed at rest 45 minutes following the intravenous administration of Technetium 73m Sestamibi. Rest ECG: NSR with non-specific ST-T wave  changes  Stress Procedure:  The patient received IV adenosine at 140 mcg/kg/min for 4 minutes.  Technetium 4m Sestamibi was injected at the 2 minute mark and quantitative spect images were obtained after a 45 minute delay.   Was initially going to give Lexiscan but the patient had active diarrhea and this could potentially make it worse.  Decided to use Adenosine.  During the infusion of Adenosine, the patient had SOB and slight wheezing.  Gave two puffs albuterol and she began to feel much better.  Stress ECG: No significant change from baseline ECG  QPS Raw Data Images:  Images were motion corrected.  Soft tissue (diaphragm, bowel activity, breast) surround heart Stress Images:  Apical thinning  Otherwise normal perfusion.   Rest Images:  Comparison with the stress images reveals no significant change. Subtraction (SDS):  No evidence of ischemia. Transient Ischemic Dilatation (Normal <1.22):  1.00 Lung/Heart Ratio (Normal <0.45):  0.21  Quantitative Gated Spect Images QGS EDV:  116 ml QGS ESV:  58 ml  Impression Exercise Capacity:  Adenosine study with no exercise. BP Response:  Normal blood pressure response. Clinical Symptoms:  No chest pain. ECG Impression:  No significant ST segment change suggestive of ischemia. Comparison with Prior Nuclear Study: No prior study  Overall Impression:  Normal perfusion with mild apical thinning  No ischemia or scar   LV Ejection Fraction: 50%.  LV Wall Motion:  NL LV Function; NL Wall Motion  [name]

## 2014-01-08 ENCOUNTER — Ambulatory Visit: Payer: Medicare Other | Admitting: Physician Assistant

## 2014-01-13 ENCOUNTER — Encounter: Payer: Self-pay | Admitting: Physician Assistant

## 2014-01-22 ENCOUNTER — Telehealth: Payer: Self-pay | Admitting: Pulmonary Disease

## 2014-01-22 ENCOUNTER — Other Ambulatory Visit (INDEPENDENT_AMBULATORY_CARE_PROVIDER_SITE_OTHER): Payer: Medicare Other

## 2014-01-22 DIAGNOSIS — D649 Anemia, unspecified: Secondary | ICD-10-CM

## 2014-01-22 LAB — FECAL OCCULT BLOOD, IMMUNOCHEMICAL: FECAL OCCULT BLD: NEGATIVE

## 2014-01-22 MED ORDER — ALBUTEROL SULFATE HFA 108 (90 BASE) MCG/ACT IN AERS: 2.0000 | INHALATION_SPRAY | RESPIRATORY_TRACT | Status: DC | PRN

## 2014-01-22 NOTE — Telephone Encounter (Signed)
RX has been sent. Nothing further needed 

## 2014-01-23 ENCOUNTER — Other Ambulatory Visit: Payer: Self-pay

## 2014-01-23 MED ORDER — TORSEMIDE 20 MG PO TABS: 40.0000 mg | ORAL_TABLET | Freq: Two times a day (BID) | ORAL | Status: DC

## 2014-01-27 ENCOUNTER — Ambulatory Visit: Payer: Medicare Other | Admitting: Nurse Practitioner

## 2014-02-09 ENCOUNTER — Telehealth: Payer: Self-pay | Admitting: *Deleted

## 2014-02-09 NOTE — Telephone Encounter (Signed)
PA to [facility] for demadex

## 2014-02-12 NOTE — Telephone Encounter (Signed)
Per [facility] regarding torsemide "the generic medication is covered without a prior authorization".

## 2014-03-06 ENCOUNTER — Other Ambulatory Visit: Payer: Medicare Other

## 2014-03-06 ENCOUNTER — Ambulatory Visit: Payer: Medicare Other | Admitting: Oncology

## 2014-04-05 ENCOUNTER — Other Ambulatory Visit: Payer: Self-pay | Admitting: Interventional Cardiology

## 2014-04-06 ENCOUNTER — Other Ambulatory Visit: Payer: Self-pay | Admitting: *Deleted

## 2014-04-14 ENCOUNTER — Other Ambulatory Visit (INDEPENDENT_AMBULATORY_CARE_PROVIDER_SITE_OTHER): Payer: Medicare Other

## 2014-04-14 ENCOUNTER — Encounter: Payer: Self-pay | Admitting: Pulmonary Disease

## 2014-04-14 ENCOUNTER — Ambulatory Visit (INDEPENDENT_AMBULATORY_CARE_PROVIDER_SITE_OTHER): Payer: Medicare Other | Admitting: Pulmonary Disease

## 2014-04-14 VITALS — BP 112/62 | HR 63 | Temp 97.7°F | Ht 61.0 in | Wt 134.6 lb

## 2014-04-14 DIAGNOSIS — R0609 Other forms of dyspnea: Secondary | ICD-10-CM

## 2014-04-14 DIAGNOSIS — I5022 Chronic systolic (congestive) heart failure: Secondary | ICD-10-CM | POA: Diagnosis not present

## 2014-04-14 DIAGNOSIS — M545 Low back pain, unspecified: Secondary | ICD-10-CM

## 2014-04-14 DIAGNOSIS — R7309 Other abnormal glucose: Secondary | ICD-10-CM

## 2014-04-14 DIAGNOSIS — F411 Generalized anxiety disorder: Secondary | ICD-10-CM

## 2014-04-14 DIAGNOSIS — L259 Unspecified contact dermatitis, unspecified cause: Secondary | ICD-10-CM

## 2014-04-14 DIAGNOSIS — J449 Chronic obstructive pulmonary disease, unspecified: Secondary | ICD-10-CM | POA: Diagnosis not present

## 2014-04-14 DIAGNOSIS — R609 Edema, unspecified: Secondary | ICD-10-CM | POA: Insufficient documentation

## 2014-04-14 DIAGNOSIS — I10 Essential (primary) hypertension: Secondary | ICD-10-CM | POA: Diagnosis not present

## 2014-04-14 DIAGNOSIS — F419 Anxiety disorder, unspecified: Secondary | ICD-10-CM

## 2014-04-14 DIAGNOSIS — D472 Monoclonal gammopathy: Secondary | ICD-10-CM

## 2014-04-14 DIAGNOSIS — I872 Venous insufficiency (chronic) (peripheral): Secondary | ICD-10-CM

## 2014-04-14 DIAGNOSIS — E538 Deficiency of other specified B group vitamins: Secondary | ICD-10-CM

## 2014-04-14 DIAGNOSIS — J4489 Other specified chronic obstructive pulmonary disease: Secondary | ICD-10-CM

## 2014-04-14 DIAGNOSIS — D649 Anemia, unspecified: Secondary | ICD-10-CM | POA: Diagnosis not present

## 2014-04-14 DIAGNOSIS — R0600 Dyspnea, unspecified: Secondary | ICD-10-CM

## 2014-04-14 DIAGNOSIS — R0989 Other specified symptoms and signs involving the circulatory and respiratory systems: Secondary | ICD-10-CM | POA: Diagnosis not present

## 2014-04-14 DIAGNOSIS — E559 Vitamin D deficiency, unspecified: Secondary | ICD-10-CM

## 2014-04-14 DIAGNOSIS — F22 Delusional disorders: Secondary | ICD-10-CM

## 2014-04-14 DIAGNOSIS — I890 Lymphedema, not elsewhere classified: Secondary | ICD-10-CM

## 2014-04-14 LAB — HEPATIC FUNCTION PANEL
ALT: 14 U/L (ref 0–35)
AST: 18 U/L (ref 0–37)
Albumin: 3.1 g/dL — ABNORMAL LOW (ref 3.5–5.2)
Alkaline Phosphatase: 60 U/L (ref 39–117)
BILIRUBIN TOTAL: 0.3 mg/dL (ref 0.2–1.2)
Bilirubin, Direct: 0 mg/dL (ref 0.0–0.3)
TOTAL PROTEIN: 8.7 g/dL — AB (ref 6.0–8.3)

## 2014-04-14 LAB — CBC WITH DIFFERENTIAL/PLATELET
Basophils Absolute: 0 10*3/uL (ref 0.0–0.1)
Basophils Relative: 0.5 % (ref 0.0–3.0)
EOS PCT: 4.8 % (ref 0.0–5.0)
Eosinophils Absolute: 0.2 10*3/uL (ref 0.0–0.7)
HCT: 28.1 % — ABNORMAL LOW (ref 36.0–46.0)
Hemoglobin: 9.2 g/dL — ABNORMAL LOW (ref 12.0–15.0)
Lymphocytes Relative: 23.9 % (ref 12.0–46.0)
Lymphs Abs: 1.1 10*3/uL (ref 0.7–4.0)
MCHC: 32.7 g/dL (ref 30.0–36.0)
MCV: 84.1 fl (ref 78.0–100.0)
Monocytes Absolute: 0.4 10*3/uL (ref 0.1–1.0)
Monocytes Relative: 8.2 % (ref 3.0–12.0)
NEUTROS PCT: 62.6 % (ref 43.0–77.0)
Neutro Abs: 2.8 10*3/uL (ref 1.4–7.7)
Platelets: 271 10*3/uL (ref 150.0–400.0)
RBC: 3.35 Mil/uL — AB (ref 3.87–5.11)
RDW: 15.9 % — ABNORMAL HIGH (ref 11.5–15.5)
WBC: 4.4 10*3/uL (ref 4.0–10.5)

## 2014-04-14 LAB — BASIC METABOLIC PANEL
BUN: 28 mg/dL — AB (ref 6–23)
CO2: 24 meq/L (ref 19–32)
CREATININE: 1.4 mg/dL — AB (ref 0.4–1.2)
Calcium: 9.2 mg/dL (ref 8.4–10.5)
Chloride: 105 mEq/L (ref 96–112)
GFR: 47.82 mL/min — ABNORMAL LOW (ref >60.00)
Glucose, Bld: 134 mg/dL — ABNORMAL HIGH (ref 70–99)
Potassium: 3.8 mEq/L (ref 3.5–5.1)
Sodium: 135 mEq/L (ref 135–145)

## 2014-04-14 LAB — SEDIMENTATION RATE: SED RATE: 110 mm/h — AB (ref 0–22)

## 2014-04-14 LAB — IBC PANEL
IRON: 43 ug/dL (ref 42–145)
Saturation Ratios: 16 % — ABNORMAL LOW (ref 20.0–50.0)
TRANSFERRIN: 191.9 mg/dL — AB (ref 212.0–360.0)

## 2014-04-14 LAB — BRAIN NATRIURETIC PEPTIDE: PRO B NATRI PEPTIDE: 191 pg/mL — AB (ref 0.0–100.0)

## 2014-04-14 LAB — HEMOGLOBIN A1C: HEMOGLOBIN A1C: 5.5 % (ref 4.6–6.5)

## 2014-04-14 MED ORDER — METHYLPREDNISOLONE ACETATE 80 MG/ML IJ SUSP
80.0000 mg | Freq: Once | INTRAMUSCULAR | Status: AC
  Administered 2014-04-14: 80 mg via INTRAMUSCULAR

## 2014-04-14 NOTE — Patient Instructions (Signed)
Today we updated your med list in our EPIC system...    Continue your current medications the same...  Today we did a pulmonary function test & gave you a Depo shot for your breathing...  Today we did your follow up blood work...    We will contact you w/ the results when available...   We will help you set up follow up appts w/ your Cardiologist (DrSmith) and your Hematologist [facility])    Hopefully they can release you to have the GI work up per [employer], and the Lymphedema treatment previously discussed...  Call for any questions...  Let's plan a follow up visit in 24mo.Marland KitchenMarland Kitchen

## 2014-04-14 NOTE — Progress Notes (Signed)
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 KitchenHYDROcodone-acetaminophen (NORCO/VICODIN) 5-325 MG per tablet TAKE ONE TABLET BY MOUTH THREE TIMES DAILY AS NEEDED  . hydrOXYzine (ATARAX/VISTARIL) 25 MG tablet Take 1 tablet (25 mg total) by mouth every 4 (four) hours as needed. For itching  . lisinopril (PRINIVIL,ZESTRIL) 10 MG tablet Take 1 tablet (10 mg total) by mouth daily.  . Multiple Vitamin (MULTIVITAMIN WITH MINERALS) TABS Take 1 tablet by mouth daily.  . ondansetron (ZOFRAN) 4 MG tablet Take 1 tablet (4 mg total) by mouth every 4 (four) hours as needed for nausea.  . potassium chloride (KLOR-CON) 20 MEQ packet Take 20 mEq by mouth 3 (three) times daily.  . silver sulfADIAZINE (SILVADENE) 1 % cream Apply daily as directed  . theophylline (THEODUR) 200 MG 12 hr tablet TAKE 1 TABLET BY MOUTH TWICE DAILY  . torsemide (DEMADEX) 20 MG tablet Take 2 tablets (40 mg total) by mouth 2 (two) times daily.  . vitamin C (ASCORBIC ACID) 500 MG tablet Take 1 tablet (500 mg total) by mouth 2 (two) times daily.  . Vitamin D, Ergocalciferol,  (DRISDOL) 50000 UNITS CAPS capsule TAKE 1 CAPSULE BY MOUTH EVERY 7 DAYS ON SUNDAY OR MONDAY    Allergies  Allergen Reactions  . Aspirin     REACTION: upset stomach  . Codeine     REACTION: nausea    Current Medications, Allergies, Past Medical History, Past Surgical History, Family History, and Social History were reviewed in [employer].    Review of Systems        See HPI - all other systems neg except as noted...   The patient complains of dyspnea on exertion, peripheral edema, and difficulty walking.  The patient denies anorexia, fever, weight loss, weight gain, vision loss, decreased  hearing, hoarseness, chest pain, syncope, prolonged cough, headaches, hemoptysis, abdominal pain, melena, hematochezia, severe indigestion/heartburn, hematuria, incontinence, muscle weakness, suspicious skin lesions, transient blindness, depression, unusual weight change, abnormal bleeding, enlarged lymph nodes, and angioedema.     Objective:   Physical Exam     WD, Overweight, 80 y/o BF in NAD... GENERAL:  Alert & oriented x 3... she has obvious paranoid delusions... HEENT:  [place]/AT, EOM-wnl, PERRLA, EACs-clear, TMs-wnl, NOSE-clear, THROAT-clear & wnl. NECK:  Supple w/ fairROM; no JVD; normal carotid impulses w/o bruits; no thyromegaly or nodules palpated; no lymphadenopathy. CHEST:  Clear to P & A; without wheezes/ rales/ or rhonchi heard.[name] HEART:  Regular Rhythm; without murmurs/ rubs/ or gallops detected... ABDOMEN:  Soft & nontender; normal bowel sounds; no organomegaly or masses palpated... EXT:  mod arthritic changes; +varicose veins/ +venous insuffic/ 4+edema in feet w/ severe stasis dermatitis... NEURO:  CN's intact;  no focal neuro deficits... DERM:  chr lymphedema & dermatitis in skin of LE's, w/ some weeping...  RADIOLOGY DATA:  Reviewed in the EPIC EMR & discussed w/ the patient...  LABORATORY DATA:  Reviewed in the EPIC EMR & discussed w/ the  patient...   Assessment & Plan:    COPD, Chr Bronchitis>  She was on Qvar, Proventil, & Theodur and she changed to ADVAIR 100Bid for awhile & we tried to stop the Theodur;  It is always unclear what she is taking as she never brings bottles or list to the office follow up visits...  HBP>  Controlled on Coreg, Lisinopril & Demedex per DrHSmith; instructed on 2gm Na diet...  Ven Insuffic & Lymphedema LEs>  We reviewed the need to for 2gm sodium diet, elev legs, support hose vs ACE wraps, continue the diuretic rx; she never went to the wound care clinic in 2012; she went to the new regional Lymphedema clinic in [place] x1 only & needs to return, we ordered [facility] visiting nurses & topical Rx w/ mild soapy water, pat dry, cover w/ silvadene (she didn't let them in to help); now niece is in charge & requests referral to [facility]=> we will request appt... ~  She is supposed to see rehab therapy in [place] w/ [facility] sched per DrHSmith...  Vit D Defic>  Her Vit D level is improved at 77, continue 50K weekly...  ANEMIA> She has a ?MGUS w/ IgG kappa paraprotein ident in 2012 but f/u labs in 2013-4 did not show monoclonal spike although both IgG & IgA levels are further elevated... She finally saw Heme- DrShadad & his notes are reviewed; he has her on oral iron therapy & observation...  Psychiatric Problem>  She persists w/ paranoid ideations revolving around worker's compensation disability;  She has repeatedly refused to go to mental health or allow me to refer to a psychiatrist;  She was seen in the ER on an Involuntary commitment petition 9/13 (by her son) but the psychiatrist rescinded the petition & sent her home...  She continues to refuse any psychiatric help...   Patient's Medications  New Prescriptions   No medications on file  Previous Medications   ALBUTEROL (PROVENTIL HFA) 108 (90 BASE) MCG/ACT INHALER    Inhale 2 puffs into the lungs every 4 (four) hours as needed for wheezing.   ALPRAZOLAM (XANAX)  0.5 MG TABLET    TAKE 1/2 TO 1 TABLET BY MOUTH THREE TIMES DAILY AS NEEDED FOR NERVES   CARVEDILOL (COREG) 3.125 MG TABLET    TAKE 1 TABLET BY MOUTH TWICE DAILY   CLOTRIMAZOLE-BETAMETHASONE (  LOTRISONE) CREAM    Apply 1 application topically 2 (two) times daily. To her feet   FERROUS SULFATE DRIED 200 (65 FE) MG TABS    Take 1 tablet by mouth 2 (two) times daily.   HYDROCODONE-ACETAMINOPHEN (NORCO/VICODIN) 5-325 MG PER TABLET    TAKE ONE TABLET BY MOUTH THREE TIMES DAILY AS NEEDED   HYDROXYZINE (ATARAX/VISTARIL) 25 MG TABLET    Take 1 tablet (25 mg total) by mouth every 4 (four) hours as needed. For itching   LISINOPRIL (PRINIVIL,ZESTRIL) 10 MG TABLET    Take 1 tablet (10 mg total) by mouth daily.   MULTIPLE VITAMIN (MULTIVITAMIN WITH MINERALS) TABS    Take 1 tablet by mouth daily.   ONDANSETRON (ZOFRAN) 4 MG TABLET    Take 1 tablet (4 mg total) by mouth every 4 (four) hours as needed for nausea.   POTASSIUM CHLORIDE (KLOR-CON) 20 MEQ PACKET    Take 20 mEq by mouth 3 (three) times daily.   SILVER SULFADIAZINE (SILVADENE) 1 % CREAM    Apply daily as directed   THEOPHYLLINE (THEODUR) 200 MG 12 HR TABLET    TAKE 1 TABLET BY MOUTH TWICE DAILY   VITAMIN C (ASCORBIC ACID) 500 MG TABLET    Take 1 tablet (500 mg total) by mouth 2 (two) times daily.   VITAMIN D, ERGOCALCIFEROL, (DRISDOL) 50000 UNITS CAPS CAPSULE    TAKE 1 CAPSULE BY MOUTH EVERY 7 DAYS ON SUNDAY OR MONDAY  Modified Medications   Modified Medication Previous Medication   TORSEMIDE (DEMADEX) 20 MG TABLET torsemide (DEMADEX) 20 MG tablet      Take 2 tablets (40 mg total) by mouth 2 (two) times daily.    Take 2 tablets (40 mg total) by mouth 2 (two) times daily.  Discontinued Medications   No medications on file

## 2014-04-15 ENCOUNTER — Telehealth: Payer: Self-pay | Admitting: Oncology

## 2014-04-15 ENCOUNTER — Telehealth: Payer: Self-pay

## 2014-04-15 LAB — TSH: TSH: 1.04 u[IU]/mL (ref 0.35–4.50)

## 2014-04-15 LAB — VITAMIN D 25 HYDROXY (VIT D DEFICIENCY, FRACTURES): VITD: 76.86 ng/mL

## 2014-04-15 LAB — VITAMIN B12: Vitamin B-12: 631 pg/mL (ref 211–911)

## 2014-04-15 NOTE — Telephone Encounter (Signed)
error 

## 2014-04-15 NOTE — Telephone Encounter (Signed)
S/W PATIENT AND R/S APPT TO 06/24 @ 9:45 LABS, 10:15 MD PATIENT CONFIRMED APPTS.

## 2014-04-20 ENCOUNTER — Encounter: Payer: Self-pay | Admitting: Interventional Cardiology

## 2014-04-20 ENCOUNTER — Ambulatory Visit: Payer: Medicare Other | Admitting: Interventional Cardiology

## 2014-04-20 ENCOUNTER — Ambulatory Visit (INDEPENDENT_AMBULATORY_CARE_PROVIDER_SITE_OTHER): Payer: Medicare Other | Admitting: Interventional Cardiology

## 2014-04-20 VITALS — BP 120/82 | HR 70 | Ht 61.0 in | Wt 132.0 lb

## 2014-04-20 DIAGNOSIS — I10 Essential (primary) hypertension: Secondary | ICD-10-CM

## 2014-04-20 DIAGNOSIS — I5022 Chronic systolic (congestive) heart failure: Secondary | ICD-10-CM

## 2014-04-20 MED ORDER — TORSEMIDE 20 MG PO TABS: 40.0000 mg | ORAL_TABLET | Freq: Two times a day (BID) | ORAL | Status: DC

## 2014-04-20 NOTE — Progress Notes (Signed)
Patient ID: CHALESE Daniel, female   DOB: 03/04/1933, 80 y.o.   MRN: [id number]    [address] [place], [place]  [address] Phone: [phone] Fax:  [phone]  Date:  04/20/2014   ID:  [name], DOB 03/04/1933, MRN [id number]  PCP:  [name], MD   ASSESSMENT:  1. Chronic combined systolic and diastolic heart failure, improved with LVEF of 50% by myocardial perfusion study in March. No evidence of ischemia on that study. 2. COPD 3. Psychiatric illness with paranoia, making today's office visits somewhat difficult because the patient spoke of conspiracy by disability officials 4. Bilateral lower extremity edema left greater than right, combined venous insufficiency and lymphedema  PLAN:  1. The initial diagnosis of systolic heart failure was based upon an echo that suggested an EF of 35-40%. A myocardial perfusion study with wall motion was performed and was normal with a low normal EF of 50%. Low-dose beta blocker, ARB, and diuretic therapy was started. There was some initial improvement in lower extremity edema however the left lower stream he has remained chronically swollen with chronic skin changes. 2. She needs the lymphedema clinic for further management of her asymmetric lower extremity edema. 3. There is no specific cardiac issue that needs to be addressed at this time. I would not change her current medical regimen.   SUBJECTIVE: [name] is a 80 y.o. female who is here with a family friend. She is following up on previous cardiac evaluation that included an echo diagnosis of low left ventricular systolic function, in the LVEF 40% range. Since that time she has undergone a myocardial perfusion study with pharmacologic stress and this was a low risk study with low normal LV EF of 50%. Therapies that were started based upon the low EF including low-dose beta blocker/ACE/and diuretic therapy have led to improved EF and decreased edema in the right  lower extremity. Left lower extremity remains severely swollen with thickened skin and chronic stasis changes. She denies orthopnea, PND, palpitations, and other cardiopulmonary complaints.  Today's office visit was made difficult by inability to obtain a history due to the patient's speaking of conspiracy theory by disability agents who have been skipping around her house and invading her body with pieces of "glass", taking blood from her, and other bizarre complaints.   Wt Readings from Last 3 Encounters:  04/20/14 132 lb (59.875 kg)  04/14/14 134 lb 9.6 oz (61.054 kg)  01/05/14 130 lb (58.968 kg)     Past Medical History  Diagnosis Date  . Obstructive chronic bronchitis without exacerbation   . Unspecified essential hypertension   . Unspecified venous (peripheral) insufficiency   . Edema 2005    "since put the glass in them"   . Other abnormal glucose   . Lumbago   . Unspecified nonpsychotic mental disorder   . Unspecified venous (peripheral) insufficiency   . Unspecified vitamin D deficiency   . Anemia, unspecified   . Anxiety   . Arthritis   . Asthma   . Systolic heart failure     EF 35 to 40% per echo January 2015  . Depression   . GERD (gastroesophageal reflux disease)     Current Outpatient Prescriptions  Medication Sig Dispense Refill  . albuterol (PROVENTIL HFA) 108 (90 BASE) MCG/ACT inhaler Inhale 2 puffs into the lungs every 4 (four) hours as needed for wheezing.  3 Inhaler  0  . ALPRAZolam (XANAX) 0.5 MG tablet TAKE 1/2 TO  1 TABLET BY MOUTH THREE TIMES DAILY AS NEEDED FOR NERVES  90 tablet  2  . carvedilol (COREG) 3.125 MG tablet TAKE 1 TABLET BY MOUTH TWICE DAILY  60 tablet  1  . clotrimazole-betamethasone (LOTRISONE) cream Apply 1 application topically 2 (two) times daily. To her feet  129 g  1  . Ferrous Sulfate Dried 200 (65 FE) MG TABS Take 1 tablet by mouth 2 (two) times daily.  30 tablet  0  . HYDROcodone-acetaminophen (NORCO/VICODIN) 5-325 MG per tablet  TAKE ONE TABLET BY MOUTH THREE TIMES DAILY AS NEEDED  90 tablet  1  . hydrOXYzine (ATARAX/VISTARIL) 25 MG tablet Take 1 tablet (25 mg total) by mouth every 4 (four) hours as needed. For itching  100 tablet  3  . lisinopril (PRINIVIL,ZESTRIL) 10 MG tablet Take 1 tablet (10 mg total) by mouth daily.  90 tablet  3  . Multiple Vitamin (MULTIVITAMIN WITH MINERALS) TABS Take 1 tablet by mouth daily.      . ondansetron (ZOFRAN) 4 MG tablet Take 1 tablet (4 mg total) by mouth every 4 (four) hours as needed for nausea.  30 tablet  1  . potassium chloride (KLOR-CON) 20 MEQ packet Take 20 mEq by mouth 3 (three) times daily.  270 tablet  1  . silver sulfADIAZINE (SILVADENE) 1 % cream Apply daily as directed  100 g  2  . theophylline (THEODUR) 200 MG 12 hr tablet TAKE 1 TABLET BY MOUTH TWICE DAILY  180 tablet  1  . torsemide (DEMADEX) 20 MG tablet Take 2 tablets (40 mg total) by mouth 2 (two) times daily.  360 tablet  3  . vitamin C (ASCORBIC ACID) 500 MG tablet Take 1 tablet (500 mg total) by mouth 2 (two) times daily.  180 tablet  1  . Vitamin D, Ergocalciferol, (DRISDOL) 50000 UNITS CAPS capsule TAKE 1 CAPSULE BY MOUTH EVERY 7 DAYS ON SUNDAY OR MONDAY  12 capsule  1   No current facility-administered medications for this visit.    Allergies:    Allergies  Allergen Reactions  . Aspirin     REACTION: upset stomach  . Codeine     REACTION: nausea  . Latex     Social History:  The patient  reports that she has never smoked. She has never used smokeless tobacco. She reports that she does not drink alcohol or use illicit drugs.   ROS:  Please see the history of present illness.   She denies orthopnea. Appetite is been stable.   All other systems reviewed and negative.   OBJECTIVE: VS:  BP 120/82  Pulse 70  Ht 5\' 1"  (1.549 m)  Wt 132 lb (59.875 kg)  BMI 24.95 kg/m2 Well nourished, well developed, in no acute distress, elderly but in no distress HEENT: normal Neck: JVD flat. Carotid bruit 2+    Cardiac:  normal S1, S2; RRR; no murmur Lungs:  clear to auscultation bilaterally, no wheezing, rhonchi or rales Abd: soft, nontender, no hepatomegaly Ext: Edema marked 3-4+ left lower extremity edema with chronic stasis changes. 1-2+ on the right with similar chronic stasis changes. Pulses unable to palpate peripheral pulses in the lower extremities Skin: warm and dry Neuro:  CNs 2-12 intact, no focal abnormalities noted  EKG:  Not performed       Signed, Illene Labrador III, MD 04/20/2014 2:42 PM

## 2014-04-20 NOTE — Patient Instructions (Addendum)
Your physician recommends that you continue on your current medications as directed. Please refer to the Current Medication list given to you today.  Your physician recommends that you schedule a follow-up appointment as needed with [facility] physician recommends that you schedule a follow-up appointment with the [facility].

## 2014-04-21 ENCOUNTER — Other Ambulatory Visit: Payer: Self-pay | Admitting: Pulmonary Disease

## 2014-04-21 DIAGNOSIS — D472 Monoclonal gammopathy: Secondary | ICD-10-CM

## 2014-04-22 ENCOUNTER — Encounter: Payer: Self-pay | Admitting: Oncology

## 2014-04-22 ENCOUNTER — Ambulatory Visit (HOSPITAL_BASED_OUTPATIENT_CLINIC_OR_DEPARTMENT_OTHER): Payer: Medicare Other | Admitting: Oncology

## 2014-04-22 ENCOUNTER — Telehealth: Payer: Self-pay | Admitting: Oncology

## 2014-04-22 ENCOUNTER — Other Ambulatory Visit (HOSPITAL_BASED_OUTPATIENT_CLINIC_OR_DEPARTMENT_OTHER): Payer: Medicare Other

## 2014-04-22 VITALS — BP 176/71 | HR 69 | Temp 97.3°F | Resp 18 | Ht 61.0 in | Wt 134.3 lb

## 2014-04-22 DIAGNOSIS — D649 Anemia, unspecified: Secondary | ICD-10-CM

## 2014-04-22 DIAGNOSIS — R609 Edema, unspecified: Secondary | ICD-10-CM

## 2014-04-22 DIAGNOSIS — R894 Abnormal immunological findings in specimens from other organs, systems and tissues: Secondary | ICD-10-CM | POA: Diagnosis not present

## 2014-04-22 DIAGNOSIS — N289 Disorder of kidney and ureter, unspecified: Secondary | ICD-10-CM | POA: Diagnosis not present

## 2014-04-22 DIAGNOSIS — D472 Monoclonal gammopathy: Secondary | ICD-10-CM | POA: Diagnosis not present

## 2014-04-22 LAB — COMPREHENSIVE METABOLIC PANEL (CC13)
ALBUMIN: 3.1 g/dL — AB (ref 3.5–5.0)
ALK PHOS: 75 U/L (ref 40–150)
ALT: 12 U/L (ref 0–55)
AST: 20 U/L (ref 5–34)
Anion Gap: 5 mEq/L (ref 3–11)
BUN: 37.6 mg/dL — ABNORMAL HIGH (ref 7.0–26.0)
CALCIUM: 9.4 mg/dL (ref 8.4–10.4)
CO2: 27 mEq/L (ref 22–29)
Chloride: 104 mEq/L (ref 98–109)
Creatinine: 1.4 mg/dL — ABNORMAL HIGH (ref 0.6–1.1)
Glucose: 88 mg/dl (ref 70–140)
POTASSIUM: 4.2 meq/L (ref 3.5–5.1)
SODIUM: 136 meq/L (ref 136–145)
TOTAL PROTEIN: 9.5 g/dL — AB (ref 6.4–8.3)
Total Bilirubin: 0.39 mg/dL (ref 0.20–1.20)

## 2014-04-22 LAB — CBC WITH DIFFERENTIAL/PLATELET
BASO%: 0.2 % (ref 0.0–2.0)
Basophils Absolute: 0 10*3/uL (ref 0.0–0.1)
EOS%: 5.1 % (ref 0.0–7.0)
Eosinophils Absolute: 0.2 10*3/uL (ref 0.0–0.5)
HCT: 29.1 % — ABNORMAL LOW (ref 34.8–46.6)
HGB: 9.1 g/dL — ABNORMAL LOW (ref 11.6–15.9)
LYMPH%: 22.5 % (ref 14.0–49.7)
MCH: 26.7 pg (ref 25.1–34.0)
MCHC: 31.3 g/dL — ABNORMAL LOW (ref 31.5–36.0)
MCV: 85.3 fL (ref 79.5–101.0)
MONO#: 0.4 10*3/uL (ref 0.1–0.9)
MONO%: 9.4 % (ref 0.0–14.0)
NEUT%: 62.8 % (ref 38.4–76.8)
NEUTROS ABS: 2.8 10*3/uL (ref 1.5–6.5)
Platelets: 219 10*3/uL (ref 145–400)
RBC: 3.41 10*6/uL — AB (ref 3.70–5.45)
RDW: 16 % — AB (ref 11.2–14.5)
WBC: 4.5 10*3/uL (ref 3.9–10.3)
lymph#: 1 10*3/uL (ref 0.9–3.3)

## 2014-04-22 LAB — IRON AND TIBC CHCC
%SAT: 30 % (ref 21–57)
Iron: 81 ug/dL (ref 41–142)
TIBC: 268 ug/dL (ref 236–444)
UIBC: 187 ug/dL (ref 120–384)

## 2014-04-22 LAB — FERRITIN CHCC: FERRITIN: 120 ng/mL (ref 9–269)

## 2014-04-22 NOTE — Telephone Encounter (Signed)
Gave pt appt for lab and MD for February 2016

## 2014-04-22 NOTE — Progress Notes (Signed)
Hematology and Oncology Follow Up Visit  [name] [id number] 03/04/1933 80 y.o. 04/22/2014 10:53 AM NADEL,[name], MDNadel, Deborra Medina, MD   Principle Diagnosis: 80 year old woman with a multifactorial anemia and elevated immunoglobulins less likely a plasma cell disorder.   Current therapy: Observation and surveillance.  Interim History:  This is a pleasant woman returns today for a followup visit. I saw her in consultation back in January of 2015 for the evaluation of a plasma cell disorder. Her workup did not reveal any evidence of end organ damage to suggest multiple myeloma. She did not have an M spike but has an elevated IgG and IgA. She does have multifactorial anemia that have been stable. She is quite frail but has no new complaints. She does have lower extremity edema currently on diuretics and has not had any hospitalization or illnesses. She has not reported any back pain or shoulder pain. Has not reported any pathological fractures or recurrent infections.  Medications: I have reviewed the patient's current medications.  Current Outpatient Prescriptions  Medication Sig Dispense Refill  . albuterol (PROVENTIL HFA) 108 (90 BASE) MCG/ACT inhaler Inhale 2 puffs into the lungs every 4 (four) hours as needed for wheezing.  3 Inhaler  0  . ALPRAZolam (XANAX) 0.5 MG tablet TAKE 1/2 TO 1 TABLET BY MOUTH THREE TIMES DAILY AS NEEDED FOR NERVES  90 tablet  2  . carvedilol (COREG) 3.125 MG tablet TAKE 1 TABLET BY MOUTH TWICE DAILY  60 tablet  1  . clotrimazole-betamethasone (LOTRISONE) cream Apply 1 application topically 2 (two) times daily. To her feet  129 g  1  . Ferrous Sulfate Dried 200 (65 FE) MG TABS Take 1 tablet by mouth 2 (two) times daily.  30 tablet  0  . HYDROcodone-acetaminophen (NORCO/VICODIN) 5-325 MG per tablet TAKE ONE TABLET BY MOUTH THREE TIMES DAILY AS NEEDED  90 tablet  1  . hydrOXYzine (ATARAX/VISTARIL) 25 MG tablet Take 1 tablet (25 mg total) by mouth every 4 (four)  hours as needed. For itching  100 tablet  3  . lisinopril (PRINIVIL,ZESTRIL) 10 MG tablet Take 1 tablet (10 mg total) by mouth daily.  90 tablet  3  . Multiple Vitamin (MULTIVITAMIN WITH MINERALS) TABS Take 1 tablet by mouth daily.      . ondansetron (ZOFRAN) 4 MG tablet Take 1 tablet (4 mg total) by mouth every 4 (four) hours as needed for nausea.  30 tablet  1  . potassium chloride (KLOR-CON) 20 MEQ packet Take 20 mEq by mouth 3 (three) times daily.  270 tablet  1  . silver sulfADIAZINE (SILVADENE) 1 % cream Apply daily as directed  100 g  2  . theophylline (THEODUR) 200 MG 12 hr tablet TAKE 1 TABLET BY MOUTH TWICE DAILY  180 tablet  1  . torsemide (DEMADEX) 20 MG tablet Take 2 tablets (40 mg total) by mouth 2 (two) times daily.  360 tablet  3  . vitamin C (ASCORBIC ACID) 500 MG tablet Take 1 tablet (500 mg total) by mouth 2 (two) times daily.  180 tablet  1  . Vitamin D, Ergocalciferol, (DRISDOL) 50000 UNITS CAPS capsule TAKE 1 CAPSULE BY MOUTH EVERY 7 DAYS ON SUNDAY OR MONDAY  12 capsule  1   No current facility-administered medications for this visit.     Allergies:  Allergies  Allergen Reactions  . Aspirin     REACTION: upset stomach  . Codeine     REACTION: nausea  . Latex  Past Medical History, Surgical history, Social history, and Family History were reviewed and updated.  Review of Systems: Constitutional:  Negative for fever, chills, night sweats, anorexia, weight loss, pain. Cardiovascular: no chest pain or dyspnea on exertion Respiratory: no cough, shortness of breath, or wheezing Neurological: no TIA or stroke symptoms Dermatological: negative ENT: negative Skin: Negative. Gastrointestinal: no abdominal pain, change in bowel habits, or black or bloody stools Genito-Urinary: no dysuria, trouble voiding, or hematuria Hematological and Lymphatic: negative Breast: negative Musculoskeletal: negative Remaining ROS negative. Physical Exam: Blood pressure 176/71,  pulse 69, temperature 97.3 F (36.3 C), temperature source Oral, resp. rate 18, height _0  (1.549 m), weight 134 lb 4.8 oz (60.918 kg). ECOG: 2 General appearance: alert and cooperative Head: Normocephalic, without obvious abnormality Neck: no adenopathy Lymph nodes: Cervical, supraclavicular, and axillary nodes normal. Heart:regular rate and rhythm, S1, S2 normal, no murmur, click, rub or gallop Lung:chest clear, no wheezing, rales, normal symmetric air entry Abdomin: soft, non-tender, without masses or organomegaly EXT:no erythema, induration, or nodules   Lab Results: Lab Results  Component Value Date   WBC 4.5 04/22/2014   HGB 9.1* 04/22/2014   HCT 29.1* 04/22/2014   MCV 85.3 04/22/2014   PLT 219 04/22/2014     Chemistry      Component Value Date/Time   NA 135 04/14/2014 1622   K 3.8 04/14/2014 1622   CL 105 04/14/2014 1622   CO2 24 04/14/2014 1622   BUN 28* 04/14/2014 1622   CREATININE 1.4* 04/14/2014 1622   CREATININE 1.22* 05/27/2012 1610      Component Value Date/Time   CALCIUM 9.2 04/14/2014 1622   ALKPHOS 60 04/14/2014 1622   AST 18 04/14/2014 1622   ALT 14 04/14/2014 1622   BILITOT 0.3 04/14/2014 1622          Impression and Plan:  80 year old with the following issues:  1. Elevated IgG and IgA quantitative immunoglobulin without any clear cut her monoclonal protein. The differential diagnosis was discussed today which include MUGS, Myeloma or Amyloid (all are not likely at this point). This is most likely reactive in nature. We'll continue to monitor this every 9-12 months to make sure we are not daily with a plasma cell disorder.  2. Anemia: Multifactorial in nature she also has anemia of renal disease. His hemoglobin is around 9 and we will continue to monitor. She could benefit from growth factor support in the future if she becomes symptomatic.  3. Followup: Will be in February of 2016.      [facility], MD 6/24/201510:53 AM

## 2014-04-24 LAB — SPEP & IFE WITH QIG
ALPHA-1-GLOBULIN: 3.3 % (ref 2.9–4.9)
Albumin ELP: 38 % — ABNORMAL LOW (ref 55.8–66.1)
Alpha-2-Globulin: 8.5 % (ref 7.1–11.8)
BETA 2: 5.3 % (ref 3.2–6.5)
Beta Globulin: 4.9 % (ref 4.7–7.2)
GAMMA GLOBULIN: 40 % — AB (ref 11.1–18.8)
IGM, SERUM: 54 mg/dL (ref 52–322)
IgA: 440 mg/dL — ABNORMAL HIGH (ref 69–380)
IgG (Immunoglobin G), Serum: 4340 mg/dL — ABNORMAL HIGH (ref 690–1700)
Total Protein, Serum Electrophoresis: 9.3 g/dL — ABNORMAL HIGH (ref 6.0–8.3)

## 2014-04-24 LAB — MULTIPLE MYELOMA PANEL, SERUM
ALBUMIN ELP: 37.6 % — AB (ref 55.8–66.1)
Alpha-1-Globulin: 3.1 % (ref 2.9–4.9)
Alpha-2-Globulin: 8 % (ref 7.1–11.8)
BETA GLOBULIN: 5.1 % (ref 4.7–7.2)
Beta 2: 5.3 % (ref 3.2–6.5)
Gamma Globulin: 40.9 % — ABNORMAL HIGH (ref 11.1–18.8)
IGA: 457 mg/dL — AB (ref 69–380)
IGM, SERUM: 57 mg/dL (ref 52–322)
IgG (Immunoglobin G), Serum: 4520 mg/dL — ABNORMAL HIGH (ref 690–1700)
TOTAL PROTEIN: 9.6 g/dL — AB (ref 6.0–8.3)

## 2014-04-24 LAB — KAPPA/LAMBDA LIGHT CHAINS
Kappa free light chain: 19.9 mg/dL — ABNORMAL HIGH (ref 0.33–1.94)
Kappa:Lambda Ratio: 3.96 — ABNORMAL HIGH (ref 0.26–1.65)
Lambda Free Lght Chn: 5.03 mg/dL — ABNORMAL HIGH (ref 0.57–2.63)

## 2014-04-27 ENCOUNTER — Other Ambulatory Visit: Payer: Self-pay | Admitting: Pulmonary Disease

## 2014-04-27 DIAGNOSIS — D649 Anemia, unspecified: Secondary | ICD-10-CM

## 2014-04-27 DIAGNOSIS — D472 Monoclonal gammopathy: Secondary | ICD-10-CM

## 2014-05-07 ENCOUNTER — Telehealth: Payer: Self-pay | Admitting: Oncology

## 2014-05-07 ENCOUNTER — Telehealth: Payer: Self-pay | Admitting: Pulmonary Disease

## 2014-05-07 DIAGNOSIS — I872 Venous insufficiency (chronic) (peripheral): Secondary | ICD-10-CM

## 2014-05-07 DIAGNOSIS — R7309 Other abnormal glucose: Secondary | ICD-10-CM

## 2014-05-07 DIAGNOSIS — M545 Low back pain: Secondary | ICD-10-CM

## 2014-05-07 NOTE — Telephone Encounter (Signed)
Called and spoke with pt and she stated that she needs a refill of the vicodin to help with her back pain.  She is having a hard time getting up and down and moving around due to her back pain. She stated that this is from her neck all the way down her back.  SN please advise thanks  Allergies  Allergen Reactions  . Aspirin     REACTION: upset stomach  . Codeine     REACTION: nausea  . Latex     Current Outpatient Prescriptions on File Prior to Visit  Medication Sig Dispense Refill  . albuterol (PROVENTIL HFA) 108 (90 BASE) MCG/ACT inhaler Inhale 2 puffs into the lungs every 4 (four) hours as needed for wheezing.  3 Inhaler  0  . ALPRAZolam (XANAX) 0.5 MG tablet TAKE 1/2 TO 1 TABLET BY MOUTH THREE TIMES DAILY AS NEEDED FOR NERVES  90 tablet  2  . carvedilol (COREG) 3.125 MG tablet TAKE 1 TABLET BY MOUTH TWICE DAILY  60 tablet  1  . clotrimazole-betamethasone (LOTRISONE) cream Apply 1 application topically 2 (two) times daily. To her feet  129 g  1  . Ferrous Sulfate Dried 200 (65 FE) MG TABS Take 1 tablet by mouth 2 (two) times daily.  30 tablet  0  . HYDROcodone-acetaminophen (NORCO/VICODIN) 5-325 MG per tablet TAKE ONE TABLET BY MOUTH THREE TIMES DAILY AS NEEDED  90 tablet  1  . hydrOXYzine (ATARAX/VISTARIL) 25 MG tablet Take 1 tablet (25 mg total) by mouth every 4 (four) hours as needed. For itching  100 tablet  3  . lisinopril (PRINIVIL,ZESTRIL) 10 MG tablet Take 1 tablet (10 mg total) by mouth daily.  90 tablet  3  . Multiple Vitamin (MULTIVITAMIN WITH MINERALS) TABS Take 1 tablet by mouth daily.      . ondansetron (ZOFRAN) 4 MG tablet Take 1 tablet (4 mg total) by mouth every 4 (four) hours as needed for nausea.  30 tablet  1  . potassium chloride (KLOR-CON) 20 MEQ packet Take 20 mEq by mouth 3 (three) times daily.  270 tablet  1  . silver sulfADIAZINE (SILVADENE) 1 % cream Apply daily as directed  100 g  2  . theophylline (THEODUR) 200 MG 12 hr tablet TAKE 1 TABLET BY MOUTH TWICE  DAILY  180 tablet  1  . torsemide (DEMADEX) 20 MG tablet Take 2 tablets (40 mg total) by mouth 2 (two) times daily.  360 tablet  3  . vitamin C (ASCORBIC ACID) 500 MG tablet Take 1 tablet (500 mg total) by mouth 2 (two) times daily.  180 tablet  1  . Vitamin D, Ergocalciferol, (DRISDOL) 50000 UNITS CAPS capsule TAKE 1 CAPSULE BY MOUTH EVERY 7 DAYS ON SUNDAY OR MONDAY  12 capsule  1   No current facility-administered medications on file prior to visit.

## 2014-05-07 NOTE — Telephone Encounter (Signed)
s.w. Dawn from Dr. Teressa Lower office and gv pt appt....she will contact pt with appt

## 2014-05-08 NOTE — Telephone Encounter (Signed)
lmomtcb x1 

## 2014-05-08 NOTE — Telephone Encounter (Signed)
Per SN---  Pt can use the tylenol as needed for pain--- We will need to refer her to [facility] ---to eval her back pain---Dr. Rolena Infante.

## 2014-05-08 NOTE — Telephone Encounter (Signed)
Pt aware of recs. Referral placed Nothing further needed

## 2014-05-22 ENCOUNTER — Telehealth: Payer: Self-pay | Admitting: Pulmonary Disease

## 2014-05-22 MED ORDER — ONDANSETRON HCL 4 MG PO TABS: 4.0000 mg | ORAL_TABLET | ORAL | Status: DC | PRN

## 2014-05-22 NOTE — Telephone Encounter (Signed)
Pt needed refill on zofran. This has been sent in. Nothing further needed

## 2014-05-26 ENCOUNTER — Encounter: Payer: Self-pay | Admitting: Podiatry

## 2014-05-26 ENCOUNTER — Ambulatory Visit (INDEPENDENT_AMBULATORY_CARE_PROVIDER_SITE_OTHER): Payer: Medicare Other | Admitting: Podiatry

## 2014-05-26 VITALS — BP 146/78 | HR 64 | Resp 16 | Ht 59.0 in | Wt 130.0 lb

## 2014-05-26 DIAGNOSIS — B351 Tinea unguium: Secondary | ICD-10-CM

## 2014-05-26 DIAGNOSIS — I890 Lymphedema, not elsewhere classified: Secondary | ICD-10-CM

## 2014-05-26 DIAGNOSIS — M79609 Pain in unspecified limb: Secondary | ICD-10-CM | POA: Diagnosis not present

## 2014-05-26 DIAGNOSIS — M79673 Pain in unspecified foot: Secondary | ICD-10-CM

## 2014-05-26 NOTE — Progress Notes (Signed)
   Subjective:    Patient ID: [name], female    DOB: 03/04/1933, 80 y.o.   MRN: [id number]  HPI Comments: "Do whatever needs to be done"  Patient states that Dr. Lenna Gilford referred here here for foot exam. She also would like to have her toenails cut. Her feet are extremely swollen, especially the left.     Review of Systems  Cardiovascular: Positive for leg swelling.  Musculoskeletal: Positive for arthralgias and gait problem.  All other systems reviewed and are negative.      Objective:   Physical Exam: I have reviewed her past medical history medications allergies surgeries social history and review of systems. Pulses are palpable right foot nonpalpable left foot due to the severe lymphedema of the left foot. Neurologic sensorium is intact per since once the monofilament plantarly bilateral. Deep tendon reflexes are not elicitable. Muscle strength is 5 over 5 dorsiflexors plantar flexors inverters everters all intrinsic musculature is intact. Orthopedic evaluation demonstrates all joints distal to the ankle a full range of motion without crepitus. The left leg and foot demonstrates severe lymphedema and painful elongated toenails one through 5 bilateral.        Assessment & Plan:  Assessment: Severe lymphedema left foot. Pain in limb secondary to onychomycosis 1 through 5 bilateral and lymphedema.  Plan: Debridement of nails 1 through 5 bilateral.

## 2014-06-01 ENCOUNTER — Other Ambulatory Visit: Payer: Self-pay | Admitting: Interventional Cardiology

## 2014-06-03 ENCOUNTER — Other Ambulatory Visit: Payer: Self-pay | Admitting: *Deleted

## 2014-06-03 MED ORDER — CARVEDILOL 3.125 MG PO TABS: ORAL_TABLET | ORAL | Status: DC

## 2014-06-03 NOTE — Telephone Encounter (Signed)
Can you clarify patients current dose? I do not see where the patient was told to go back to the 3.125mg , but that is what is listed on the last office note. Please advise. Thanks, MI

## 2014-06-05 ENCOUNTER — Ambulatory Visit: Payer: Medicare Other | Admitting: Oncology

## 2014-07-01 ENCOUNTER — Other Ambulatory Visit: Payer: Self-pay | Admitting: Pulmonary Disease

## 2014-07-01 MED ORDER — HYDROXYZINE HCL 25 MG PO TABS: 25.0000 mg | ORAL_TABLET | ORAL | Status: DC | PRN

## 2014-07-09 ENCOUNTER — Encounter: Payer: Self-pay | Admitting: Gastroenterology

## 2014-07-09 ENCOUNTER — Ambulatory Visit (INDEPENDENT_AMBULATORY_CARE_PROVIDER_SITE_OTHER): Payer: Medicare Other | Admitting: Gastroenterology

## 2014-07-09 VITALS — BP 148/82 | HR 76 | Ht 60.25 in | Wt 137.2 lb

## 2014-07-09 DIAGNOSIS — F5089 Other specified eating disorder: Secondary | ICD-10-CM

## 2014-07-09 DIAGNOSIS — F22 Delusional disorders: Secondary | ICD-10-CM

## 2014-07-09 DIAGNOSIS — D649 Anemia, unspecified: Secondary | ICD-10-CM | POA: Diagnosis not present

## 2014-07-09 MED ORDER — NA SULFATE-K SULFATE-MG SULF 17.5-3.13-1.6 GM/177ML PO SOLN: 1.0000 | Freq: Once | ORAL | Status: DC

## 2014-07-09 NOTE — Progress Notes (Signed)
      History of Present Illness:  [name] has returned for evaluation of intermittent nausea and vomiting and lower abdominal pain.[name]  She was evaluated in January, 2015 for the same symptoms.  Upper endoscopy and colonoscopy were recommended but this was not done.  She has a polyclonal Gammopathy and chronic venous stasis of her lower extremities, and takes supplementary iron.  He describes spontaneous nausea and nausea postprandially with intermittent vomiting.  Weight has been stable.   Review of Systems: Pertinent positive and negative review of systems were noted in the above HPI section. All other review of systems were otherwise negative.    Current Medications, Allergies, Past Medical History, Past Surgical History, Family History and Social History were reviewed in [facility] record  Vital signs were reviewed in today's medical record. Physical Exam: General: Well developed , well nourished, no acute distress Skin: anicteric Head: Normocephalic and atraumatic Eyes:  sclerae anicteric, EOMI Ears: Normal auditory acuity Mouth: No deformity or lesions Lungs: Clear throughout to auscultation Heart: Regular rate and rhythm; no murmurs, rubs or bruits Abdomen: Soft, non tender and non distended. No masses, hepatosplenomegaly or hernias noted. Normal Bowel sounds Rectal:deferred Musculoskeletal: Symmetrical with no gross deformities  Pulses:  Normal pulses noted Extremities: She has elephantine lower extremities with scaling of the skin  Neurological: Alert oriented x 4, grossly nonfocal Psychological:  Alert and cooperative. Normal mood and affect  See Assessment and Plan under Problem List

## 2014-07-09 NOTE — Assessment & Plan Note (Signed)
1.5 year history of intermittent nausea vomiting and abdominal pain.  Symptoms could be due to ulcer or nonulcer dyspepsia.  Note history of microcytic anemia.  Gastroparesis is also a consideration.  Recommendations #1 upper endoscopy #2 Hemoccults #3 gastric emptying scan if endoscopy is negative

## 2014-07-09 NOTE — Patient Instructions (Signed)
You have been scheduled for an endoscopy and colonoscopy. Please follow the written instructions given to you at your visit today. Please pick up your prep at the pharmacy within the next 1-3 days. If you use inhalers (even only as needed), please bring them with you on the day of your procedure. Your physician has requested that you go to www.startemmi.com and enter the access code given to you at your visit today. This web site gives a general overview about your procedure. However, you should still follow specific instructions given to you by our office regarding your preparation for the procedure.  Hold IRON 7 days prior to procedure

## 2014-07-09 NOTE — Assessment & Plan Note (Signed)
Probable multifactorial etiology for anemia.  Patient has never had a colonoscopy.

## 2014-07-20 ENCOUNTER — Telehealth: Payer: Self-pay | Admitting: Pulmonary Disease
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
## 2014-07-20 NOTE — Telephone Encounter (Signed)
LMTCB for [employer]
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
## 2014-08-24 ENCOUNTER — Encounter: Payer: Self-pay | Admitting: Gastroenterology

## 2014-08-24 ENCOUNTER — Other Ambulatory Visit: Payer: Self-pay

## 2014-08-24 ENCOUNTER — Ambulatory Visit (AMBULATORY_SURGERY_CENTER): Payer: Medicare Other | Admitting: Gastroenterology

## 2014-08-24 VITALS — BP 176/84 | HR 75 | Temp 96.5°F | Resp 14 | Ht 60.0 in | Wt 137.0 lb

## 2014-08-24 DIAGNOSIS — D649 Anemia, unspecified: Secondary | ICD-10-CM

## 2014-08-24 DIAGNOSIS — K5730 Diverticulosis of large intestine without perforation or abscess without bleeding: Secondary | ICD-10-CM | POA: Diagnosis not present

## 2014-08-24 DIAGNOSIS — F5089 Other specified eating disorder: Secondary | ICD-10-CM

## 2014-08-24 DIAGNOSIS — Z1211 Encounter for screening for malignant neoplasm of colon: Secondary | ICD-10-CM | POA: Diagnosis not present

## 2014-08-24 DIAGNOSIS — R112 Nausea with vomiting, unspecified: Secondary | ICD-10-CM | POA: Diagnosis not present

## 2014-08-24 DIAGNOSIS — D128 Benign neoplasm of rectum: Secondary | ICD-10-CM

## 2014-08-24 DIAGNOSIS — I428 Other cardiomyopathies: Secondary | ICD-10-CM | POA: Diagnosis not present

## 2014-08-24 DIAGNOSIS — D129 Benign neoplasm of anus and anal canal: Secondary | ICD-10-CM

## 2014-08-24 DIAGNOSIS — J45909 Unspecified asthma, uncomplicated: Secondary | ICD-10-CM | POA: Diagnosis not present

## 2014-08-24 DIAGNOSIS — I10 Essential (primary) hypertension: Secondary | ICD-10-CM | POA: Diagnosis not present

## 2014-08-24 DIAGNOSIS — F508 Other eating disorders: Secondary | ICD-10-CM | POA: Diagnosis not present

## 2014-08-24 DIAGNOSIS — R1110 Vomiting, unspecified: Secondary | ICD-10-CM

## 2014-08-24 MED ORDER — SODIUM CHLORIDE 0.9 % IV SOLN: 500.0000 mL | INTRAVENOUS | Status: DC

## 2014-08-24 NOTE — Patient Instructions (Addendum)
YOU HAD AN ENDOSCOPIC PROCEDURE TODAY AT THE [facility]: Refer to the procedure report that was given to you for any specific questions about what was found during the examination.  If the procedure report does not answer your questions, please call your gastroenterologist to clarify.  If you requested that your care partner not be given the details of your procedure findings, then the procedure report has been included in a sealed envelope for you to review at your convenience later.  YOU SHOULD EXPECT: Some feelings of bloating in the abdomen. Passage of more gas than usual.  Walking can help get rid of the air that was put into your GI tract during the procedure and reduce the bloating. If you had a lower endoscopy (such as a colonoscopy or flexible sigmoidoscopy) you may notice spotting of blood in your stool or on the toilet paper. If you underwent a bowel prep for your procedure, then you may not have a normal bowel movement for a few days.  DIET: Your first meal following the procedure should be a light meal and then it is ok to progress to your normal diet.  A half-sandwich or bowl of soup is an example of a good first meal.  Heavy or fried foods are harder to digest and may make you feel nauseous or bloated.  Likewise meals heavy in dairy and vegetables can cause extra gas to form and this can also increase the bloating.  Drink plenty of fluids but you should avoid alcoholic beverages for 24 hours.  ACTIVITY: Your care partner should take you home directly after the procedure.  You should plan to take it easy, moving slowly for the rest of the day.  You can resume normal activity the day after the procedure however you should NOT DRIVE or use heavy machinery for 24 hours (because of the sedation medicines used during the test).    SYMPTOMS TO REPORT IMMEDIATELY: A gastroenterologist can be reached at any hour.  During normal business hours, 8:30 AM to 5:00 PM Monday through Friday,  call [phone].  After hours and on weekends, please call the GI answering service at [phone] who will take a message and have the physician on call contact you.   Following lower endoscopy (colonoscopy or flexible sigmoidoscopy):  Excessive amounts of blood in the stool  Significant tenderness or worsening of abdominal pains  Swelling of the abdomen that is new, acute  Fever of 100F or higher  Following upper endoscopy (EGD)  Vomiting of blood or coffee ground material  New chest pain or pain under the shoulder blades  Painful or persistently difficult swallowing  New shortness of breath  Fever of 100F or higher  Black, tarry-looking stools  FOLLOW UP: If any biopsies were taken you will be contacted by phone or by letter within the next 1-3 weeks.  Call your gastroenterologist if you have not heard about the biopsies in 3 weeks.  Our staff will call the home number listed on your records the next business day following your procedure to check on you and address any questions or concerns that you may have at that time regarding the information given to you following your procedure. This is a courtesy call and so if there is no answer at the home number and we have not heard from you through the emergency physician on call, we will assume that you have returned to your regular daily activities without incident.  SIGNATURES/CONFIDENTIALITY: You and/or your care   partner have signed paperwork which will be entered into your electronic medical record.  These signatures attest to the fact that that the information above on your After Visit Summary has been reviewed and is understood.  Full responsibility of the confidentiality of this discharge information lies with you and/or your care-partner.  Polyp, diverticulosis, hemorrhoids, high fiber diet-handouts given  Repeat colonoscopy will be determined by pathology.  Normal EGD exam  Gastric Emptying scan scheduled for Nov 2 at  1245pm. Nothing to eat or drink for 8 hours prior to scan. Stop eating and drinking at 5 am on Nov 2.

## 2014-08-24 NOTE — Progress Notes (Signed)
Called to room to assist during endoscopic procedure.  Patient ID and intended procedure confirmed with present staff. Received instructions for my participation in the procedure from the performing physician.  

## 2014-08-24 NOTE — Op Note (Signed)
[name] [name], [id number]   ENDOSCOPY PROCEDURE REPORT  PATIENT: Sophia, Daniel  MR#: [id number] BIRTHDATE: 03/04/1933 , 62  yrs. old GENDER: female ENDOSCOPIST: Inda Castle, MD REFERRED BY:  Teressa Lower, M.D. PROCEDURE DATE:  08/24/2014 PROCEDURE:  EGD, diagnostic ASA CLASS:     Class II INDICATIONS:  nausea and vomiting. MEDICATIONS: Residual sedation present, Monitored anesthesia care, and Propofol 200 mg IV TOPICAL ANESTHETIC:  DESCRIPTION OF PROCEDURE: After the risks benefits and alternatives of the procedure were thoroughly explained, informed consent was obtained.  The LB [id number] endoscope was introduced through the mouth and advanced to the third portion of the duodenum , Without limitations.  The instrument was slowly withdrawn as the mucosa was fully examined.      EXAM: The esophagus and gastroesophageal junction were completely normal in appearance.  The stomach was entered and closely examined.The antrum, angularis, and lesser curvature were well visualized, including a retroflexed view of the cardia and fundus. The stomach wall was normally distensable.  The scope passed easily through the pylorus into the duodenum.         The scope was then withdrawn from the patient and the procedure completed.  COMPLICATIONS: There were no immediate complications.  ENDOSCOPIC IMPRESSION: Normal appearing esophagus and GE junction, the stomach was well visualized and normal in appearance, normal appearing duodenum  RECOMMENDATIONS: My office will arrange for you to have a Gastric Emptying Scan performed.  This is a radiology test that gives an idea of how well your stomach functions.  REPEAT EXAM:  eSigned:  Inda Castle, MD 08/24/2014 4:07 PM    CC:

## 2014-08-24 NOTE — Op Note (Signed)
[place]  [address] [place], [id number]   COLONOSCOPY PROCEDURE REPORT  PATIENT: [name], [name]  MR#: [id number] BIRTHDATE: 03/04/1933 , 81  yrs. old GENDER: female ENDOSCOPIST: Inda Castle, MD REFERRED QI:WLNLG Lenna Gilford, M.D. PROCEDURE DATE:  08/24/2014 PROCEDURE:   Colonoscopy with snare polypectomy and Submucosal injection, any substance First Screening Colonoscopy - Avg.  risk and is 50 yrs.  old or older Yes.  Prior Negative Screening - Now for repeat screening. N/A  History of Adenoma - Now for follow-up colonoscopy & has been > or = to 3 yrs.  N/A  Polyps Removed Today? Yes. ASA CLASS:   Class II INDICATIONS:first colonoscopy. MEDICATIONS: Monitored anesthesia care and Propofol 200 mg IV  DESCRIPTION OF PROCEDURE:   After the risks benefits and alternatives of the procedure were thoroughly explained, informed consent was obtained.  The digital rectal exam revealed hemorrhoids, Grade III.   The LB [id number]  endoscope was introduced through the anus and advanced to the cecum, which was identified by both the appendix and ileocecal valve. No adverse events experienced.   The quality of the prep was Suprep good  The instrument was then slowly withdrawn as the colon was fully examined.      COLON FINDINGS: A sessile polyp measuring 18 mm in size was found in the rectum approximate 3 cm from the anal verge..  A tattoo was applied.  A polypectomy was performed using snare cautery.  The resection was complete, the polyp tissue was completely retrieved and sent to histology.   There was severe diverticulosis noted in the sigmoid colon with associated colonic narrowing and muscular hypertrophy.   Internal hemorrhoids were found.  Retroflexed views revealed no abnormalities. The time to cecum=9 minutes 25 seconds. Withdrawal time=11 minutes 14 seconds.  The scope was withdrawn and the procedure completed. COMPLICATIONS: There were no  immediate complications.  ENDOSCOPIC IMPRESSION: 1.   Sessile polyp measuring 18 mm in size was found in the rectum; a tattoo was applied; polypectomy was performed using snare cautery  2.   There was severe diverticulosis noted in the sigmoid colon 3.   Internal hemorrhoids  RECOMMENDATIONS: If the polyp(s) removed today are proven to be adenomatous (pre-cancerous) polyps, you will need a colonoscopy in 3 years. Otherwise you should continue to follow colorectal cancer screening guidelines for "routine risk" patients with a colonoscopy in 10 years.  You will receive a letter within 1-2 weeks with the results of your biopsy as well as final recommendations.  Please call my office if you have not received a letter after 3 weeks.  eSigned:  Inda Castle, MD 08/24/2014 4:02 PM   cc:   PATIENT NAME:  [name], [name] MR#: [id number]

## 2014-08-24 NOTE — Progress Notes (Signed)
Procedure ends, to recovery, report given and VSS. 

## 2014-08-25 ENCOUNTER — Telehealth: Payer: Self-pay | Admitting: *Deleted

## 2014-08-25 NOTE — Telephone Encounter (Signed)
Message left

## 2014-08-31 ENCOUNTER — Ambulatory Visit (HOSPITAL_COMMUNITY)
Admission: RE | Admit: 2014-08-31 | Discharge: 2014-08-31 | Disposition: A | Discharge status: Home / Self Care | Payer: Medicare Other | Source: Ambulatory Visit | Attending: Diagnostic Radiology | Admitting: Diagnostic Radiology

## 2014-08-31 DIAGNOSIS — R1110 Vomiting, unspecified: Secondary | ICD-10-CM | POA: Diagnosis not present

## 2014-08-31 DIAGNOSIS — R112 Nausea with vomiting, unspecified: Secondary | ICD-10-CM | POA: Diagnosis not present

## 2014-08-31 MED ORDER — TECHNETIUM TC 99M SULFUR COLLOID
2.2000 | Freq: Once | INTRAVENOUS | Status: AC | PRN
  Administered 2014-08-31: 2.2 via ORAL

## 2014-09-01 ENCOUNTER — Encounter: Payer: Self-pay | Admitting: Gastroenterology

## 2014-09-01 NOTE — Progress Notes (Signed)
Quick Note:  Please inform the patient that GES was normal and to continue current plan of action OV 6 weeks ______

## 2014-09-08 ENCOUNTER — Ambulatory Visit: Payer: Medicare Other | Admitting: Podiatry

## 2014-09-18 ENCOUNTER — Telehealth: Payer: Self-pay | Admitting: Oncology

## 2014-09-18 NOTE — Telephone Encounter (Signed)
Lvm advising appt chg from 2/23 ([facility] clinic) to 2/24 @ 3pm. Also mailed revised appt calendar.

## 2014-10-13 ENCOUNTER — Telehealth: Payer: Self-pay | Admitting: Pulmonary Disease

## 2014-10-13 ENCOUNTER — Telehealth: Payer: Self-pay | Admitting: Gastroenterology

## 2014-10-13 ENCOUNTER — Ambulatory Visit: Payer: Medicare Other | Admitting: Gastroenterology

## 2014-10-13 MED ORDER — TRAMADOL HCL 50 MG PO TABS: 50.0000 mg | ORAL_TABLET | Freq: Three times a day (TID) | ORAL | Status: DC

## 2014-10-13 NOTE — Telephone Encounter (Signed)
Pt is aware of SN's recommendations. Tramadol has been called into pt's pharmacy.

## 2014-10-13 NOTE — Telephone Encounter (Signed)
Per SN---  She will need ortho eval for the back pain.   We cannot call in vicodin only tramadol 50 mg  #90  1 po TID prn pain.  And she will need to see the specialist for further eval of her back.  thanks

## 2014-10-13 NOTE — Telephone Encounter (Signed)
No charge. 

## 2014-10-13 NOTE — Telephone Encounter (Signed)
Pt states she is having a lot of low back pain due to h/o ruptured disc.  Also having right heel pain with same swelling in feet and legs.  She is requesting refill on Vicodin.  Last refilled on 06/26/13 for #90 tablets.  Please advise if ok to refill.

## 2014-10-14 ENCOUNTER — Ambulatory Visit: Payer: Medicare Other | Admitting: Pulmonary Disease

## 2014-10-21 ENCOUNTER — Other Ambulatory Visit: Payer: Self-pay | Admitting: Pulmonary Disease

## 2014-11-04 ENCOUNTER — Ambulatory Visit: Payer: Medicare Other | Admitting: Pulmonary Disease

## 2014-11-11 ENCOUNTER — Ambulatory Visit: Payer: Medicare Other | Admitting: Pulmonary Disease

## 2014-11-12 ENCOUNTER — Telehealth: Payer: Self-pay | Admitting: Pulmonary Disease

## 2014-11-12 NOTE — Telephone Encounter (Signed)
Spoke with pt, requesting xanax refill and something for nausea, as pt has been feeling nauseous X2 weeks.  Pt wants this to be sent to [facility] [address].    Last ov: 04/14/14- 3 cancelled appts since then.  Next ov: 11/17/14.  Dr. Lenna Gilford please advise.  Thanks!  Allergies  Allergen Reactions  . Aspirin     REACTION: upset stomach  . Codeine     REACTION: nausea  . Latex    Current Outpatient Prescriptions on File Prior to Visit  Medication Sig Dispense Refill  . albuterol (PROVENTIL HFA) 108 (90 BASE) MCG/ACT inhaler Inhale 2 puffs into the lungs every 4 (four) hours as needed for wheezing. 3 Inhaler 0  . ALPRAZolam (XANAX) 0.5 MG tablet TAKE 1/2 TO 1 TABLET BY MOUTH THREE TIMES DAILY AS NEEDED FOR NERVES 90 tablet 2  . carvedilol (COREG) 3.125 MG tablet TAKE 1 TABLET BY MOUTH TWICE DAILY 60 tablet 5  . clotrimazole-betamethasone (LOTRISONE) cream Apply 1 application topically 2 (two) times daily. To her feet 129 g 1  . Ferrous Sulfate Dried 200 (65 FE) MG TABS Take 1 tablet by mouth 2 (two) times daily. 30 tablet 0  . HYDROcodone-acetaminophen (NORCO/VICODIN) 5-325 MG per tablet TAKE ONE TABLET BY MOUTH THREE TIMES DAILY AS NEEDED 90 tablet 1  . hydrOXYzine (ATARAX/VISTARIL) 25 MG tablet Take 1 tablet (25 mg total) by mouth every 4 (four) hours as needed. For itching 100 tablet 3  . lisinopril (PRINIVIL,ZESTRIL) 10 MG tablet Take 1 tablet (10 mg total) by mouth daily. 90 tablet 3  . Multiple Vitamin (MULTIVITAMIN WITH MINERALS) TABS Take 1 tablet by mouth daily.    . ondansetron (ZOFRAN) 4 MG tablet Take 1 tablet (4 mg total) by mouth every 4 (four) hours as needed for nausea. 30 tablet 0  . potassium chloride (KLOR-CON) 20 MEQ packet Take 20 mEq by mouth 3 (three) times daily. 270 tablet 1  . silver sulfADIAZINE (SILVADENE) 1 % cream Apply daily as directed 100 g 2  . theophylline (THEODUR) 200 MG 12 hr tablet TAKE 1 TABLET BY MOUTH TWICE DAILY 180 tablet 1  .  torsemide (DEMADEX) 20 MG tablet Take 2 tablets (40 mg total) by mouth 2 (two) times daily. 360 tablet 3  . traMADol (ULTRAM) 50 MG tablet Take 1 tablet (50 mg total) by mouth 3 (three) times daily. 90 tablet 0  . vitamin C (ASCORBIC ACID) 500 MG tablet Take 1 tablet (500 mg total) by mouth 2 (two) times daily. 180 tablet 1  . Vitamin D, Ergocalciferol, (DRISDOL) 50000 UNITS CAPS capsule TAKE 1 CAPSULE BY MOUTH EVERY 7 DAYS ON SUNDAY OR MONDAY 4 capsule 0   No current facility-administered medications on file prior to visit.

## 2014-11-13 MED ORDER — ONDANSETRON HCL 4 MG PO TABS: 4.0000 mg | ORAL_TABLET | Freq: Three times a day (TID) | ORAL | Status: DC | PRN

## 2014-11-13 MED ORDER — ALPRAZOLAM 0.5 MG PO TABS: ORAL_TABLET | ORAL | Status: DC

## 2014-11-13 NOTE — Telephone Encounter (Signed)
Rxs called to pharm  Pt aware  Will keep appt  Nothing further needed

## 2014-11-13 NOTE — Telephone Encounter (Signed)
Per SN--  Call in zofran 4 mg  #30  1 po TID prn nausea with no refills Xanax   0.5 mg  #30  1 po TID prn   With no refills.    Keep appt with SN on 1/19 thanks

## 2014-11-17 ENCOUNTER — Ambulatory Visit: Payer: Medicare Other | Admitting: Pulmonary Disease

## 2014-11-18 ENCOUNTER — Other Ambulatory Visit: Payer: Self-pay | Admitting: Interventional Cardiology

## 2014-11-24 ENCOUNTER — Ambulatory Visit: Payer: Medicare Other | Admitting: Pulmonary Disease

## 2014-12-03 ENCOUNTER — Ambulatory Visit: Payer: Medicare Other | Admitting: Pulmonary Disease

## 2014-12-08 ENCOUNTER — Encounter: Payer: Self-pay | Admitting: Gastroenterology

## 2014-12-08 ENCOUNTER — Ambulatory Visit: Payer: Medicare Other | Admitting: Gastroenterology

## 2014-12-08 ENCOUNTER — Ambulatory Visit (INDEPENDENT_AMBULATORY_CARE_PROVIDER_SITE_OTHER): Payer: Medicare Other | Admitting: Gastroenterology

## 2014-12-08 VITALS — BP 164/80 | HR 76 | Ht 60.25 in | Wt 145.2 lb

## 2014-12-08 DIAGNOSIS — Z86010 Personal history of colonic polyps: Secondary | ICD-10-CM

## 2014-12-08 DIAGNOSIS — F508 Other eating disorders: Secondary | ICD-10-CM

## 2014-12-08 DIAGNOSIS — F5089 Other specified eating disorder: Secondary | ICD-10-CM

## 2014-12-08 NOTE — Assessment & Plan Note (Signed)
This is a intermittent problem without specific etiology.  This may be stress-related.  No upper GI pathology has been identified.

## 2014-12-08 NOTE — Assessment & Plan Note (Signed)
Plan follow-up sigmoidoscopy followed by full colonoscopy one year later

## 2014-12-08 NOTE — Progress Notes (Signed)
      History of Present Illness:  Ms. Grams return for general follow-up.  Upper endoscopy in October, 2015 was normal.  Colonoscopy at the same date demonstrated a large rectal polyp.  This was removed.  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  The high-grade dysplasia was away from the cauterized edge.  Gastric emptying scan in November, 2015 was normal.  She still complains of very intermittent nausea and vomiting.  She inquired other this could be due to nerves.  Weight has been stable.    Review of Systems: Pertinent positive and negative review of systems were noted in the above HPI section. All other review of systems were otherwise negative.    Current Medications, Allergies, Past Medical History, Past Surgical History, Family History and Social History were reviewed in [facility] record  Vital signs were reviewed in today's medical record. Physical Exam: General: Well developed , well nourished, no acute distress  See Assessment and Plan under Problem List

## 2014-12-08 NOTE — Patient Instructions (Signed)
We have scheduled your procedure on 12/29/2014 at [place] instructions have been given Cc Scott Nadel,MD

## 2014-12-11 ENCOUNTER — Other Ambulatory Visit: Payer: Self-pay | Admitting: Interventional Cardiology

## 2014-12-14 NOTE — Telephone Encounter (Signed)
Needs follow up lab work prior to next appt.BMET

## 2014-12-16 ENCOUNTER — Ambulatory Visit: Payer: Medicare Other | Admitting: Pulmonary Disease

## 2014-12-22 ENCOUNTER — Ambulatory Visit: Payer: Medicare Other | Admitting: Oncology

## 2014-12-22 ENCOUNTER — Other Ambulatory Visit: Payer: Medicare Other

## 2014-12-23 ENCOUNTER — Ambulatory Visit: Payer: Medicare Other | Admitting: Oncology

## 2014-12-23 ENCOUNTER — Other Ambulatory Visit: Payer: Medicare Other

## 2014-12-24 ENCOUNTER — Ambulatory Visit: Payer: Medicare Other | Admitting: Pulmonary Disease

## 2014-12-25 ENCOUNTER — Ambulatory Visit: Payer: Medicare Other | Admitting: Pulmonary Disease

## 2014-12-25 ENCOUNTER — Telehealth: Payer: Self-pay | Admitting: Pulmonary Disease

## 2014-12-25 MED ORDER — THEOPHYLLINE ER 200 MG PO TB12: ORAL_TABLET | ORAL | Status: DC

## 2014-12-25 MED ORDER — ALBUTEROL SULFATE HFA 108 (90 BASE) MCG/ACT IN AERS: 2.0000 | INHALATION_SPRAY | RESPIRATORY_TRACT | Status: DC | PRN

## 2014-12-25 NOTE — Telephone Encounter (Signed)
Refills have been sent in for the pt.  Nothing further is needed.

## 2014-12-29 ENCOUNTER — Ambulatory Visit (HOSPITAL_COMMUNITY): Admission: RE | Admit: 2014-12-29 | Payer: Medicare Other | Source: Ambulatory Visit | Admitting: Gastroenterology

## 2014-12-29 ENCOUNTER — Encounter (HOSPITAL_COMMUNITY): Admission: RE | Payer: Self-pay | Source: Ambulatory Visit

## 2014-12-29 SURGERY — SIGMOIDOSCOPY, FLEXIBLE
Anesthesia: Moderate Sedation

## 2015-01-01 ENCOUNTER — Ambulatory Visit: Payer: Medicare Other | Admitting: Pulmonary Disease

## 2015-01-05 ENCOUNTER — Ambulatory Visit: Payer: Medicare Other | Admitting: Pulmonary Disease

## 2015-01-11 ENCOUNTER — Ambulatory Visit: Payer: Medicare Other | Admitting: Pulmonary Disease

## 2015-01-15 ENCOUNTER — Ambulatory Visit: Payer: Medicare Other | Admitting: Pulmonary Disease

## 2015-01-26 ENCOUNTER — Ambulatory Visit: Payer: Medicare Other | Admitting: Pulmonary Disease

## 2015-02-05 ENCOUNTER — Other Ambulatory Visit: Payer: Self-pay | Admitting: Pulmonary Disease

## 2015-02-09 ENCOUNTER — Ambulatory Visit: Payer: Medicare Other | Admitting: Pulmonary Disease

## 2015-02-11 ENCOUNTER — Other Ambulatory Visit (INDEPENDENT_AMBULATORY_CARE_PROVIDER_SITE_OTHER): Payer: Medicare Other

## 2015-02-11 ENCOUNTER — Encounter: Payer: Self-pay | Admitting: Pulmonary Disease

## 2015-02-11 ENCOUNTER — Ambulatory Visit (INDEPENDENT_AMBULATORY_CARE_PROVIDER_SITE_OTHER)
Admission: RE | Admit: 2015-02-11 | Discharge: 2015-02-11 | Disposition: A | Discharge status: Home / Self Care | Payer: Medicare Other | Source: Ambulatory Visit | Attending: Pulmonary Disease | Admitting: Pulmonary Disease

## 2015-02-11 ENCOUNTER — Ambulatory Visit (INDEPENDENT_AMBULATORY_CARE_PROVIDER_SITE_OTHER): Payer: Medicare Other | Admitting: Pulmonary Disease

## 2015-02-11 VITALS — BP 122/70 | HR 94 | Temp 98.4°F | Ht 60.25 in | Wt 137.2 lb

## 2015-02-11 DIAGNOSIS — I10 Essential (primary) hypertension: Secondary | ICD-10-CM

## 2015-02-11 DIAGNOSIS — D519 Vitamin B12 deficiency anemia, unspecified: Secondary | ICD-10-CM

## 2015-02-11 DIAGNOSIS — J4489 Other specified chronic obstructive pulmonary disease: Secondary | ICD-10-CM

## 2015-02-11 DIAGNOSIS — J449 Chronic obstructive pulmonary disease, unspecified: Secondary | ICD-10-CM | POA: Diagnosis not present

## 2015-02-11 DIAGNOSIS — I872 Venous insufficiency (chronic) (peripheral): Secondary | ICD-10-CM

## 2015-02-11 DIAGNOSIS — I5022 Chronic systolic (congestive) heart failure: Secondary | ICD-10-CM

## 2015-02-11 DIAGNOSIS — R05 Cough: Secondary | ICD-10-CM | POA: Diagnosis not present

## 2015-02-11 DIAGNOSIS — I890 Lymphedema, not elsewhere classified: Secondary | ICD-10-CM

## 2015-02-11 DIAGNOSIS — I517 Cardiomegaly: Secondary | ICD-10-CM | POA: Diagnosis not present

## 2015-02-11 DIAGNOSIS — R0602 Shortness of breath: Secondary | ICD-10-CM | POA: Diagnosis not present

## 2015-02-11 LAB — CBC WITH DIFFERENTIAL/PLATELET
BASOS PCT: 0.4 % (ref 0.0–3.0)
Basophils Absolute: 0 10*3/uL (ref 0.0–0.1)
Eosinophils Absolute: 0.3 10*3/uL (ref 0.0–0.7)
Eosinophils Relative: 4.4 % (ref 0.0–5.0)
HCT: 28.5 % — ABNORMAL LOW (ref 36.0–46.0)
Hemoglobin: 9.4 g/dL — ABNORMAL LOW (ref 12.0–15.0)
LYMPHS ABS: 1.2 10*3/uL (ref 0.7–4.0)
Lymphocytes Relative: 18 % (ref 12.0–46.0)
MCHC: 33 g/dL (ref 30.0–36.0)
MCV: 81.1 fl (ref 78.0–100.0)
MONO ABS: 0.4 10*3/uL (ref 0.1–1.0)
Monocytes Relative: 5.9 % (ref 3.0–12.0)
NEUTROS PCT: 71.3 % (ref 43.0–77.0)
Neutro Abs: 4.6 10*3/uL (ref 1.4–7.7)
Platelets: 348 10*3/uL (ref 150.0–400.0)
RBC: 3.51 Mil/uL — ABNORMAL LOW (ref 3.87–5.11)
RDW: 15.9 % — ABNORMAL HIGH (ref 11.5–15.5)
WBC: 6.5 10*3/uL (ref 4.0–10.5)

## 2015-02-11 LAB — BASIC METABOLIC PANEL
BUN: 32 mg/dL — ABNORMAL HIGH (ref 6–23)
CALCIUM: 9.6 mg/dL (ref 8.4–10.5)
CO2: 29 mEq/L (ref 19–32)
Chloride: 99 mEq/L (ref 96–112)
Creatinine, Ser: 1.35 mg/dL — ABNORMAL HIGH (ref 0.40–1.20)
GFR: 48.41 mL/min — ABNORMAL LOW (ref >60.00)
Glucose, Bld: 85 mg/dL (ref 70–99)
Potassium: 3.5 mEq/L (ref 3.5–5.1)
SODIUM: 133 meq/L — AB (ref 135–145)

## 2015-02-11 LAB — VITAMIN B12

## 2015-02-11 LAB — HEPATIC FUNCTION PANEL
ALK PHOS: 67 U/L (ref 39–117)
ALT: 10 U/L (ref 0–35)
AST: 16 U/L (ref 0–37)
Albumin: 3.2 g/dL — ABNORMAL LOW (ref 3.5–5.2)
BILIRUBIN DIRECT: 0 mg/dL (ref 0.0–0.3)
Total Bilirubin: 0.4 mg/dL (ref 0.2–1.2)
Total Protein: 10.1 g/dL — ABNORMAL HIGH (ref 6.0–8.3)

## 2015-02-11 LAB — BRAIN NATRIURETIC PEPTIDE: PRO B NATRI PEPTIDE: 262 pg/mL — AB (ref 0.0–100.0)

## 2015-02-11 MED ORDER — METHYLPREDNISOLONE ACETATE 80 MG/ML IJ SUSP
80.0000 mg | Freq: Once | INTRAMUSCULAR | Status: AC
  Administered 2015-02-11: 80 mg via INTRAMUSCULAR

## 2015-02-11 MED ORDER — THEOPHYLLINE ER 200 MG PO TB12: ORAL_TABLET | ORAL | Status: DC

## 2015-02-11 NOTE — Progress Notes (Signed)
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 needed for nausea.  . potassium chloride (KLOR-CON) 20 MEQ packet Take 20 mEq by mouth 3 (three) times daily.  [name] PROAIR HFA 108 (90 BASE) MCG/ACT inhaler INHALE 2 PUFFS EVERY 4 HOURS AS NEEDED FOR WHEEZING  . silver sulfADIAZINE (SILVADENE) 1 % cream Apply daily as directed  . theophylline (THEODUR) 200 MG 12 hr tablet TAKE 1 TABLET BY MOUTH TWICE DAILY  . torsemide (DEMADEX) 20 MG tablet Take 2 tablets (40 mg total) by mouth 2 (two) times daily.  [name] torsemide (DEMADEX) 20 MG tablet TAKE 2 TABLETS TWICE A DAY  . traMADol (ULTRAM) 50 MG tablet Take 1 tablet (50 mg total) by mouth 3 (three) times daily.  . vitamin C (ASCORBIC ACID) 500 MG tablet Take 1 tablet (500 mg total) by mouth 2 (two) times daily.  . Vitamin D, Ergocalciferol, (DRISDOL) 50000 UNITS CAPS capsule TAKE 1 CAPSULE BY MOUTH EVERY 7 DAYS ON SUNDAY OR MONDAY  . [DISCONTINUED] theophylline (THEODUR) 200 MG 12 hr tablet TAKE 1 TABLET BY MOUTH TWICE DAILY  . [EXPIRED] methylPREDNISolone acetate  (DEPO-MEDROL) injection 80 mg     Allergies  Allergen Reactions  . Aspirin     REACTION: upset stomach  . Codeine     REACTION: nausea  . Latex     Current Medications, Allergies, Past Medical History, Past Surgical History, Family History, and Social History were reviewed in [employer] record.    Review of Systems        See HPI - all other systems neg except as noted...   The patient complains of dyspnea on exertion, peripheral edema, and difficulty walking.  The patient denies anorexia, fever, weight loss, weight gain, vision loss, decreased hearing, hoarseness, chest pain, syncope, prolonged cough, headaches, hemoptysis, abdominal pain, melena, hematochezia, severe indigestion/heartburn, hematuria, incontinence, muscle weakness, suspicious skin lesions, transient blindness, depression, unusual weight change, abnormal bleeding, enlarged lymph nodes, and angioedema.     Objective:   Physical Exam     WD, Overweight, 81 y/o BF  in NAD... GENERAL:  Alert & oriented x 3... she has obvious paranoid delusions... HEENT:  [place]/AT, EOM-wnl, PERRLA, EACs-clear, TMs-wnl, NOSE-clear, THROAT-clear & wnl. NECK:  Supple w/ fairROM; no JVD; normal carotid impulses w/o bruits; no thyromegaly or nodules palpated; no lymphadenopathy. CHEST:  Clear to P & A; without wheezes/ rales/ or rhonchi heard.[name] HEART:  Regular Rhythm; without murmurs/ rubs/ or gallops detected... ABDOMEN:  Soft & nontender; normal bowel sounds; no organomegaly or masses palpated... EXT:  mod arthritic changes; +varicose veins/ +venous insuffic/ 4+edema in left foot w/ severe stasis dermatitis... NEURO:  CN's intact;  no focal neuro deficits... DERM:  chr lymphedema & dermatitis in skin of LE's, w/ some weeping...  RADIOLOGY DATA:  Reviewed in the EPIC EMR & discussed w/ the patient...  LABORATORY DATA:  Reviewed in the EPIC EMR & discussed w/ the patient...   Assessment & Plan:    COPD, Chr  Bronchitis>  She was on Qvar, Proventil, & Theodur and she changed to ADVAIR 100Bid for awhile & we tried to stop the Theodur;  It is always unclear what she is taking as she never brings bottles or list to the office follow up visits, but she has stopped the Advair & prefers the Theodur200Bid stating that this helps the most; has rescue inhaler for prn use...  HBP>  Controlled on Coreg, Lisinopril & Demedex per DrHSmith; instructed on 2gm Na diet...  Ven Insuffic & Lymphedema LEs>  We reviewed the need to for 2gm sodium diet, elev legs, support hose vs ACE wraps, continue the diuretic rx; she never went to the wound care clinic in 2012; she went to the new regional Lymphedema clinic in [place] x1 only & needs to return, we ordered [facility] visiting nurses & topical Rx w/ mild soapy water, pat dry, cover w/ silvadene (she didn't let them in to help); now niece is in charge & requests referral to [facility]=> we requested appt- ?if she ever went... She was supposed to see rehab therapy in [place] w/ [facility] sched per DrHSmith but we don't have notes...  Vit D Defic>  Her Vit D level is improved at 77, continue 50K weekly...  ANEMIA> She has a ?MGUS w/ IgG kappa paraprotein ident in 2012 but f/u labs in 2013-4 did not show monoclonal spike although both IgG & IgA levels are further elevated... She finally saw Heme- DrShadad & his notes are reviewed; he has her on oral iron therapy & observation...  Psychiatric Problem>  She persists w/ paranoid ideations revolving around worker's compensation disability;  She has repeatedly refused to go to mental health or allow me to refer to a psychiatrist;  She was seen in the ER on an Involuntary commitment petition 9/13 (by her son) but the psychiatrist rescinded the petition & sent her home...  She continues to refuse any psychiatric help...   Patient's Medications  New Prescriptions   No medications on file  Previous Medications   ALPRAZOLAM (XANAX) 0.5 MG TABLET    TAKE 1/2 TO  1 TABLET BY MOUTH THREE TIMES DAILY AS NEEDED FOR NERVES   CARVEDILOL (COREG) 3.125 MG TABLET    TAKE 1 TABLET BY MOUTH TWICE DAILY   CLOTRIMAZOLE-BETAMETHASONE (LOTRISONE) CREAM    Apply 1 application topically 2 (two) times daily. To her feet   FERROUS SULFATE DRIED 200 (65 FE) MG TABS    Take 1 tablet by mouth 2 (two) times daily.   HYDROCODONE-ACETAMINOPHEN (NORCO/VICODIN) 5-325 MG PER TABLET    TAKE ONE  TABLET BY MOUTH THREE TIMES DAILY AS NEEDED   HYDROXYZINE (ATARAX/VISTARIL) 25 MG TABLET    Take 1 tablet (25 mg total) by mouth every 4 (four) hours as needed. For itching   LISINOPRIL (PRINIVIL,ZESTRIL) 10 MG TABLET    TAKE 1 TABLET DAILY   MULTIPLE VITAMIN (MULTIVITAMIN WITH MINERALS) TABS    Take 1 tablet by mouth daily.   ONDANSETRON (ZOFRAN) 4 MG TABLET    Take 1 tablet (4 mg total) by mouth 3 (three) times daily as needed for nausea.   POTASSIUM CHLORIDE (KLOR-CON) 20 MEQ PACKET    Take 20 mEq by mouth 3 (three) times daily.   PROAIR HFA 108 (90 BASE) MCG/ACT INHALER    INHALE 2 PUFFS EVERY 4 HOURS AS NEEDED FOR WHEEZING   SILVER SULFADIAZINE (SILVADENE) 1 % CREAM    Apply daily as directed   TORSEMIDE (DEMADEX) 20 MG TABLET    Take 2 tablets (40 mg total) by mouth 2 (two) times daily.   TORSEMIDE (DEMADEX) 20 MG TABLET    TAKE 2 TABLETS TWICE A DAY   TRAMADOL (ULTRAM) 50 MG TABLET    Take 1 tablet (50 mg total) by mouth 3 (three) times daily.   VITAMIN C (ASCORBIC ACID) 500 MG TABLET    Take 1 tablet (500 mg total) by mouth 2 (two) times daily.   VITAMIN D, ERGOCALCIFEROL, (DRISDOL) 50000 UNITS CAPS CAPSULE    TAKE 1 CAPSULE BY MOUTH EVERY 7 DAYS ON SUNDAY OR MONDAY  Modified Medications   Modified Medication Previous Medication   THEOPHYLLINE (THEODUR) 200 MG 12 HR TABLET theophylline (THEODUR) 200 MG 12 hr tablet      TAKE 1 TABLET BY MOUTH TWICE DAILY    TAKE 1 TABLET BY MOUTH TWICE DAILY  Discontinued Medications   No medications on file

## 2015-02-11 NOTE — Patient Instructions (Signed)
Today we updated your med list in our EPIC system...    Continue your current medications the same...  Today we rechecked your blood work 7 CXR...    We will contact you w/ the results when available...   We will arrange for a Cardiology f/u appt w/ DrHSmith...  Call for any questions...  Let's plan a follow up visit in 57mo, sooner if needed for problems.Marland KitchenMarland Kitchen

## 2015-02-23 ENCOUNTER — Telehealth: Payer: Self-pay | Admitting: Pulmonary Disease

## 2015-02-23 NOTE — Telephone Encounter (Signed)
Pt states she needs to get a local Rx for all of the following meds as her Mail order pharmacy has had issues with their system and can not get Rx's out to patients:   Xanax Coreg Lisinopril Iron Hydroxyzine Zofran ProAir Theophylline Tramadol Vit D  Pt is aware that I will send to SN to advise and will get a call back tomorrow.

## 2015-02-24 ENCOUNTER — Other Ambulatory Visit: Payer: Self-pay

## 2015-02-24 MED ORDER — LISINOPRIL 10 MG PO TABS: 10.0000 mg | ORAL_TABLET | Freq: Every day | ORAL | Status: DC

## 2015-02-24 MED ORDER — ALBUTEROL SULFATE HFA 108 (90 BASE) MCG/ACT IN AERS: INHALATION_SPRAY | RESPIRATORY_TRACT | Status: DC

## 2015-02-24 MED ORDER — VITAMIN D (ERGOCALCIFEROL) 1.25 MG (50000 UNIT) PO CAPS: ORAL_CAPSULE | ORAL | Status: DC

## 2015-02-24 MED ORDER — TRAMADOL HCL 50 MG PO TABS: 50.0000 mg | ORAL_TABLET | Freq: Three times a day (TID) | ORAL | Status: DC

## 2015-02-24 MED ORDER — ONDANSETRON HCL 4 MG PO TABS: 4.0000 mg | ORAL_TABLET | Freq: Three times a day (TID) | ORAL | Status: DC | PRN

## 2015-02-24 MED ORDER — FERROUS SULFATE DRIED 200 (65 FE) MG PO TABS: 1.0000 | ORAL_TABLET | Freq: Two times a day (BID) | ORAL | Status: DC

## 2015-02-24 MED ORDER — ALPRAZOLAM 0.5 MG PO TABS: ORAL_TABLET | ORAL | Status: DC

## 2015-02-24 MED ORDER — THEOPHYLLINE ER 200 MG PO TB12: ORAL_TABLET | ORAL | Status: DC

## 2015-02-24 MED ORDER — HYDROXYZINE HCL 25 MG PO TABS: 25.0000 mg | ORAL_TABLET | ORAL | Status: DC | PRN

## 2015-02-24 MED ORDER — CARVEDILOL 3.125 MG PO TABS: ORAL_TABLET | ORAL | Status: DC

## 2015-02-24 NOTE — Telephone Encounter (Signed)
Called and spoke with pt about refills on medications and that SN approved the refills. Pt stated that local pharmacy was [facility] on [address] in [place]. Refills sent in at this time. Nothing further is needed.

## 2015-03-01 ENCOUNTER — Telehealth: Payer: Self-pay | Admitting: Pulmonary Disease

## 2015-03-01 NOTE — Telephone Encounter (Signed)
LMTCB X1 

## 2015-03-01 NOTE — Telephone Encounter (Signed)
Per SN, ok to change theophylline to 400mg  q daily. Thanks

## 2015-03-01 NOTE — Telephone Encounter (Signed)
lmomtcb x1 for pt to make aware before we send in RX

## 2015-03-01 NOTE — Telephone Encounter (Signed)
Returned call to [facility] - spoke with Delilah Shan regarding the patient's Rx's sent to [facility]. After looking further into Medications, it looks as though all refills sent on 02/24/15 were sent to both [facility] and [facility]. Will need to speak with patient to see where the refills need to go. Will then need to speak with Dr Jeannine Kitten nurse to ensure that future refills are cancelled if sent in error.   Will send to [facility] as FYI of the above.   LM for pt to return call x 1

## 2015-03-01 NOTE — Telephone Encounter (Signed)
Called spoke with [facility]. Was advised theophylline 200 mg no longer available. They do have theophylline 400 mg ER. Please advise SN thanks

## 2015-03-02 NOTE — Telephone Encounter (Signed)
lmomtcb x2 for pt 

## 2015-03-03 MED ORDER — FERROUS SULFATE DRIED 200 (65 FE) MG PO TABS: 1.0000 | ORAL_TABLET | Freq: Two times a day (BID) | ORAL | Status: DC

## 2015-03-03 MED ORDER — CLOTRIMAZOLE-BETAMETHASONE 1-0.05 % EX CREA: 1.0000 "application " | TOPICAL_CREAM | Freq: Two times a day (BID) | CUTANEOUS | Status: DC

## 2015-03-03 MED ORDER — VITAMIN D (ERGOCALCIFEROL) 1.25 MG (50000 UNIT) PO CAPS: ORAL_CAPSULE | ORAL | Status: DC

## 2015-03-03 MED ORDER — ONDANSETRON HCL 4 MG PO TABS: 4.0000 mg | ORAL_TABLET | Freq: Three times a day (TID) | ORAL | Status: DC | PRN

## 2015-03-03 MED ORDER — ALBUTEROL SULFATE HFA 108 (90 BASE) MCG/ACT IN AERS: INHALATION_SPRAY | RESPIRATORY_TRACT | Status: DC

## 2015-03-03 MED ORDER — TRAMADOL HCL 50 MG PO TABS: 50.0000 mg | ORAL_TABLET | Freq: Three times a day (TID) | ORAL | Status: DC

## 2015-03-03 MED ORDER — THEOPHYLLINE ER 200 MG PO TB12: ORAL_TABLET | ORAL | Status: DC

## 2015-03-03 MED ORDER — ALPRAZOLAM 0.5 MG PO TABS: ORAL_TABLET | ORAL | Status: DC

## 2015-03-03 MED ORDER — HYDROXYZINE HCL 25 MG PO TABS: 25.0000 mg | ORAL_TABLET | ORAL | Status: DC | PRN

## 2015-03-03 NOTE — Telephone Encounter (Signed)
Pt called back. Pt stated she needed theophyline, xanax, hydroxyzine, tramadol, zofran, iron, and lotrisone refilled at [facility]. All meds sent to pharmacy. Pt verbalized understanding and denied any further questions or concerns at this time.

## 2015-03-03 NOTE — Telephone Encounter (Signed)
lmtcb for pt (2 phone notes from 5/2 needing addressed).

## 2015-03-03 NOTE — Telephone Encounter (Signed)
lmtcb for pt.  

## 2015-03-04 NOTE — Telephone Encounter (Signed)
lmomtcb  

## 2015-03-05 MED ORDER — THEOPHYLLINE ER 400 MG PO CP24: 400.0000 mg | ORAL_CAPSULE | Freq: Every day | ORAL | Status: DC

## 2015-03-05 NOTE — Telephone Encounter (Signed)
Patient notified.  Rx sent to [facility]. Nothing further needed.

## 2015-03-08 ENCOUNTER — Ambulatory Visit: Payer: Medicare Other | Admitting: Interventional Cardiology

## 2015-04-03 ENCOUNTER — Inpatient Hospital Stay (HOSPITAL_COMMUNITY)
Admission: EM | Admit: 2015-04-03 | Discharge: 2015-04-06 | DRG: 812 | Disposition: A | Discharge status: Skilled Nursing Facility | Payer: Medicare Other | Attending: Internal Medicine | Admitting: Internal Medicine

## 2015-04-03 ENCOUNTER — Encounter (HOSPITAL_COMMUNITY): Payer: Self-pay | Admitting: *Deleted

## 2015-04-03 ENCOUNTER — Emergency Department (HOSPITAL_COMMUNITY): Payer: Medicare Other

## 2015-04-03 DIAGNOSIS — E876 Hypokalemia: Secondary | ICD-10-CM | POA: Diagnosis present

## 2015-04-03 DIAGNOSIS — K5730 Diverticulosis of large intestine without perforation or abscess without bleeding: Secondary | ICD-10-CM | POA: Diagnosis present

## 2015-04-03 DIAGNOSIS — L98499 Non-pressure chronic ulcer of skin of other sites with unspecified severity: Secondary | ICD-10-CM | POA: Insufficient documentation

## 2015-04-03 DIAGNOSIS — Z79899 Other long term (current) drug therapy: Secondary | ICD-10-CM | POA: Diagnosis not present

## 2015-04-03 DIAGNOSIS — S0181XA Laceration without foreign body of other part of head, initial encounter: Secondary | ICD-10-CM | POA: Diagnosis not present

## 2015-04-03 DIAGNOSIS — S0592XA Unspecified injury of left eye and orbit, initial encounter: Secondary | ICD-10-CM | POA: Diagnosis present

## 2015-04-03 DIAGNOSIS — J45909 Unspecified asthma, uncomplicated: Secondary | ICD-10-CM | POA: Diagnosis present

## 2015-04-03 DIAGNOSIS — J449 Chronic obstructive pulmonary disease, unspecified: Secondary | ICD-10-CM | POA: Diagnosis present

## 2015-04-03 DIAGNOSIS — D128 Benign neoplasm of rectum: Secondary | ICD-10-CM | POA: Diagnosis present

## 2015-04-03 DIAGNOSIS — F29 Unspecified psychosis not due to a substance or known physiological condition: Secondary | ICD-10-CM | POA: Diagnosis not present

## 2015-04-03 DIAGNOSIS — S299XXA Unspecified injury of thorax, initial encounter: Secondary | ICD-10-CM | POA: Diagnosis not present

## 2015-04-03 DIAGNOSIS — R627 Adult failure to thrive: Secondary | ICD-10-CM | POA: Diagnosis present

## 2015-04-03 DIAGNOSIS — S0990XA Unspecified injury of head, initial encounter: Secondary | ICD-10-CM | POA: Diagnosis not present

## 2015-04-03 DIAGNOSIS — I890 Lymphedema, not elsewhere classified: Secondary | ICD-10-CM

## 2015-04-03 DIAGNOSIS — Z833 Family history of diabetes mellitus: Secondary | ICD-10-CM | POA: Diagnosis not present

## 2015-04-03 DIAGNOSIS — Z809 Family history of malignant neoplasm, unspecified: Secondary | ICD-10-CM | POA: Diagnosis not present

## 2015-04-03 DIAGNOSIS — F22 Delusional disorders: Secondary | ICD-10-CM | POA: Diagnosis not present

## 2015-04-03 DIAGNOSIS — Z8249 Family history of ischemic heart disease and other diseases of the circulatory system: Secondary | ICD-10-CM

## 2015-04-03 DIAGNOSIS — Z860100 Personal history of colon polyps, unspecified: Secondary | ICD-10-CM

## 2015-04-03 DIAGNOSIS — M6281 Muscle weakness (generalized): Secondary | ICD-10-CM | POA: Diagnosis not present

## 2015-04-03 DIAGNOSIS — K219 Gastro-esophageal reflux disease without esophagitis: Secondary | ICD-10-CM | POA: Diagnosis present

## 2015-04-03 DIAGNOSIS — L03116 Cellulitis of left lower limb: Secondary | ICD-10-CM | POA: Diagnosis not present

## 2015-04-03 DIAGNOSIS — N183 Chronic kidney disease, stage 3 unspecified: Secondary | ICD-10-CM | POA: Diagnosis present

## 2015-04-03 DIAGNOSIS — D631 Anemia in chronic kidney disease: Secondary | ICD-10-CM | POA: Diagnosis not present

## 2015-04-03 DIAGNOSIS — I739 Peripheral vascular disease, unspecified: Secondary | ICD-10-CM | POA: Diagnosis not present

## 2015-04-03 DIAGNOSIS — F5089 Other specified eating disorder: Secondary | ICD-10-CM | POA: Diagnosis present

## 2015-04-03 DIAGNOSIS — L97829 Non-pressure chronic ulcer of other part of left lower leg with unspecified severity: Secondary | ICD-10-CM | POA: Diagnosis present

## 2015-04-03 DIAGNOSIS — Z8659 Personal history of other mental and behavioral disorders: Secondary | ICD-10-CM

## 2015-04-03 DIAGNOSIS — D472 Monoclonal gammopathy: Secondary | ICD-10-CM | POA: Diagnosis present

## 2015-04-03 DIAGNOSIS — Z23 Encounter for immunization: Secondary | ICD-10-CM | POA: Diagnosis not present

## 2015-04-03 DIAGNOSIS — E860 Dehydration: Secondary | ICD-10-CM | POA: Diagnosis present

## 2015-04-03 DIAGNOSIS — Y998 Other external cause status: Secondary | ICD-10-CM | POA: Diagnosis not present

## 2015-04-03 DIAGNOSIS — I5022 Chronic systolic (congestive) heart failure: Secondary | ICD-10-CM | POA: Diagnosis present

## 2015-04-03 DIAGNOSIS — R0781 Pleurodynia: Secondary | ICD-10-CM | POA: Diagnosis not present

## 2015-04-03 DIAGNOSIS — R9431 Abnormal electrocardiogram [ECG] [EKG]: Secondary | ICD-10-CM | POA: Diagnosis not present

## 2015-04-03 DIAGNOSIS — R112 Nausea with vomiting, unspecified: Secondary | ICD-10-CM | POA: Diagnosis not present

## 2015-04-03 DIAGNOSIS — K621 Rectal polyp: Secondary | ICD-10-CM | POA: Diagnosis not present

## 2015-04-03 DIAGNOSIS — D509 Iron deficiency anemia, unspecified: Secondary | ICD-10-CM | POA: Diagnosis not present

## 2015-04-03 DIAGNOSIS — R55 Syncope and collapse: Secondary | ICD-10-CM | POA: Diagnosis present

## 2015-04-03 DIAGNOSIS — F419 Anxiety disorder, unspecified: Secondary | ICD-10-CM | POA: Diagnosis present

## 2015-04-03 DIAGNOSIS — I10 Essential (primary) hypertension: Secondary | ICD-10-CM | POA: Diagnosis present

## 2015-04-03 DIAGNOSIS — S098XXA Other specified injuries of head, initial encounter: Secondary | ICD-10-CM | POA: Diagnosis not present

## 2015-04-03 DIAGNOSIS — D580 Hereditary spherocytosis: Secondary | ICD-10-CM | POA: Diagnosis not present

## 2015-04-03 DIAGNOSIS — F508 Other eating disorders: Secondary | ICD-10-CM | POA: Diagnosis not present

## 2015-04-03 DIAGNOSIS — Z9181 History of falling: Secondary | ICD-10-CM | POA: Diagnosis not present

## 2015-04-03 DIAGNOSIS — I872 Venous insufficiency (chronic) (peripheral): Secondary | ICD-10-CM | POA: Diagnosis present

## 2015-04-03 DIAGNOSIS — M19172 Post-traumatic osteoarthritis, left ankle and foot: Secondary | ICD-10-CM | POA: Diagnosis not present

## 2015-04-03 DIAGNOSIS — E861 Hypovolemia: Secondary | ICD-10-CM | POA: Diagnosis present

## 2015-04-03 DIAGNOSIS — Z9049 Acquired absence of other specified parts of digestive tract: Secondary | ICD-10-CM | POA: Diagnosis present

## 2015-04-03 DIAGNOSIS — F23 Brief psychotic disorder: Secondary | ICD-10-CM | POA: Diagnosis not present

## 2015-04-03 DIAGNOSIS — D126 Benign neoplasm of colon, unspecified: Secondary | ICD-10-CM | POA: Diagnosis present

## 2015-04-03 DIAGNOSIS — F99 Mental disorder, not otherwise specified: Secondary | ICD-10-CM | POA: Diagnosis not present

## 2015-04-03 DIAGNOSIS — R262 Difficulty in walking, not elsewhere classified: Secondary | ICD-10-CM | POA: Diagnosis not present

## 2015-04-03 DIAGNOSIS — R51 Headache: Secondary | ICD-10-CM | POA: Diagnosis not present

## 2015-04-03 DIAGNOSIS — I129 Hypertensive chronic kidney disease with stage 1 through stage 4 chronic kidney disease, or unspecified chronic kidney disease: Secondary | ICD-10-CM | POA: Diagnosis present

## 2015-04-03 DIAGNOSIS — L03119 Cellulitis of unspecified part of limb: Secondary | ICD-10-CM

## 2015-04-03 DIAGNOSIS — S29001A Unspecified injury of muscle and tendon of front wall of thorax, initial encounter: Secondary | ICD-10-CM | POA: Diagnosis not present

## 2015-04-03 DIAGNOSIS — Y9389 Activity, other specified: Secondary | ICD-10-CM | POA: Diagnosis not present

## 2015-04-03 DIAGNOSIS — M7989 Other specified soft tissue disorders: Secondary | ICD-10-CM | POA: Diagnosis not present

## 2015-04-03 DIAGNOSIS — I517 Cardiomegaly: Secondary | ICD-10-CM | POA: Diagnosis not present

## 2015-04-03 DIAGNOSIS — Y9289 Other specified places as the place of occurrence of the external cause: Secondary | ICD-10-CM | POA: Diagnosis not present

## 2015-04-03 DIAGNOSIS — S01112A Laceration without foreign body of left eyelid and periocular area, initial encounter: Secondary | ICD-10-CM | POA: Diagnosis not present

## 2015-04-03 DIAGNOSIS — W01198A Fall on same level from slipping, tripping and stumbling with subsequent striking against other object, initial encounter: Secondary | ICD-10-CM | POA: Diagnosis not present

## 2015-04-03 DIAGNOSIS — S0083XA Contusion of other part of head, initial encounter: Secondary | ICD-10-CM | POA: Diagnosis not present

## 2015-04-03 DIAGNOSIS — D500 Iron deficiency anemia secondary to blood loss (chronic): Secondary | ICD-10-CM | POA: Diagnosis not present

## 2015-04-03 DIAGNOSIS — Z91040 Latex allergy status: Secondary | ICD-10-CM | POA: Diagnosis not present

## 2015-04-03 DIAGNOSIS — L0390 Cellulitis, unspecified: Secondary | ICD-10-CM | POA: Diagnosis present

## 2015-04-03 LAB — CBC WITH DIFFERENTIAL/PLATELET
Basophils Absolute: 0 10*3/uL (ref 0.0–0.1)
Basophils Relative: 0 % (ref 0–1)
Eosinophils Absolute: 0 10*3/uL (ref 0.0–0.7)
Eosinophils Relative: 0 % (ref 0–5)
HEMATOCRIT: 17.1 % — AB (ref 36.0–46.0)
Hemoglobin: 5.4 g/dL — CL (ref 12.0–15.0)
LYMPHS PCT: 5 % — AB (ref 12–46)
Lymphs Abs: 1 10*3/uL (ref 0.7–4.0)
MCH: 26.1 pg (ref 26.0–34.0)
MCHC: 31.6 g/dL (ref 30.0–36.0)
MCV: 82.6 fL (ref 78.0–100.0)
MONO ABS: 0.8 10*3/uL (ref 0.1–1.0)
Monocytes Relative: 4 % (ref 3–12)
Neutro Abs: 20.7 10*3/uL — ABNORMAL HIGH (ref 1.7–7.7)
Neutrophils Relative %: 92 % — ABNORMAL HIGH (ref 43–77)
Platelets: 378 10*3/uL (ref 150–400)
RBC: 2.07 MIL/uL — ABNORMAL LOW (ref 3.87–5.11)
RDW: 16.7 % — ABNORMAL HIGH (ref 11.5–15.5)
WBC: 22.6 10*3/uL — AB (ref 4.0–10.5)

## 2015-04-03 LAB — COMPREHENSIVE METABOLIC PANEL
ALK PHOS: 71 U/L (ref 38–126)
ALT: 23 U/L (ref 14–54)
ANION GAP: 7 (ref 5–15)
AST: 34 U/L (ref 15–41)
Albumin: 2.5 g/dL — ABNORMAL LOW (ref 3.5–5.0)
BUN: 30 mg/dL — AB (ref 6–20)
CO2: 20 mmol/L — AB (ref 22–32)
Calcium: 9 mg/dL (ref 8.9–10.3)
Chloride: 120 mmol/L — ABNORMAL HIGH (ref 101–111)
Creatinine, Ser: 1.28 mg/dL — ABNORMAL HIGH (ref 0.44–1.00)
GFR calc Af Amer: 44 mL/min — ABNORMAL LOW (ref >60)
GFR calc non Af Amer: 38 mL/min — ABNORMAL LOW (ref >60)
Glucose, Bld: 110 mg/dL — ABNORMAL HIGH (ref 65–99)
Potassium: 3.7 mmol/L (ref 3.5–5.1)
SODIUM: 147 mmol/L — AB (ref 135–145)
Total Bilirubin: 1.1 mg/dL (ref 0.3–1.2)
Total Protein: 8.5 g/dL — ABNORMAL HIGH (ref 6.5–8.1)

## 2015-04-03 LAB — IRON AND TIBC
Iron: 19 ug/dL — ABNORMAL LOW (ref 28–170)
SATURATION RATIOS: 13 % (ref 10.4–31.8)
TIBC: 143 ug/dL — ABNORMAL LOW (ref 250–450)
UIBC: 124 ug/dL

## 2015-04-03 LAB — URINE MICROSCOPIC-ADD ON

## 2015-04-03 LAB — URINALYSIS, ROUTINE W REFLEX MICROSCOPIC
GLUCOSE, UA: NEGATIVE mg/dL
Ketones, ur: 15 mg/dL — AB
LEUKOCYTES UA: NEGATIVE
Nitrite: NEGATIVE
PH: 5.5 (ref 5.0–8.0)
Protein, ur: 30 mg/dL — AB
SPECIFIC GRAVITY, URINE: 1.014 (ref 1.005–1.030)
Urobilinogen, UA: 1 mg/dL (ref 0.0–1.0)

## 2015-04-03 LAB — RETICULOCYTES
RBC.: 2.57 MIL/uL — AB (ref 3.87–5.11)
Retic Count, Absolute: 23.1 10*3/uL (ref 19.0–186.0)
Retic Ct Pct: 0.9 % (ref 0.4–3.1)

## 2015-04-03 LAB — POC OCCULT BLOOD, ED: Fecal Occult Bld: NEGATIVE

## 2015-04-03 LAB — FERRITIN: Ferritin: 540 ng/mL — ABNORMAL HIGH (ref 11–307)

## 2015-04-03 LAB — VITAMIN B12: Vitamin B-12: 2980 pg/mL — ABNORMAL HIGH (ref 180–914)

## 2015-04-03 LAB — ABO/RH: ABO/RH(D): B POS

## 2015-04-03 LAB — FOLATE: Folate: 40.9 ng/mL (ref >5.9)

## 2015-04-03 LAB — I-STAT CG4 LACTIC ACID, ED: LACTIC ACID, VENOUS: 1.09 mmol/L (ref 0.5–2.0)

## 2015-04-03 LAB — PREPARE RBC (CROSSMATCH)

## 2015-04-03 MED ORDER — SODIUM CHLORIDE 0.9 % IV SOLN
1000.0000 mL | Freq: Once | INTRAVENOUS | Status: AC
  Administered 2015-04-03: 1000 mL via INTRAVENOUS

## 2015-04-03 MED ORDER — SODIUM CHLORIDE 0.9 % IV SOLN
Freq: Once | INTRAVENOUS | Status: AC
  Administered 2015-04-03: 10 mL/h via INTRAVENOUS

## 2015-04-03 MED ORDER — PIPERACILLIN-TAZOBACTAM 3.375 G IVPB
3.3750 g | Freq: Three times a day (TID) | INTRAVENOUS | Status: DC
  Administered 2015-04-04 (×2): 3.375 g via INTRAVENOUS
  Filled 2015-04-03 (×3): qty 50

## 2015-04-03 MED ORDER — FUROSEMIDE 10 MG/ML IJ SOLN
20.0000 mg | Freq: Once | INTRAMUSCULAR | Status: AC
  Administered 2015-04-03: 20 mg via INTRAVENOUS
  Filled 2015-04-03: qty 4

## 2015-04-03 MED ORDER — VANCOMYCIN HCL IN DEXTROSE 750-5 MG/150ML-% IV SOLN: 750.0000 mg | INTRAVENOUS | Status: DC

## 2015-04-03 MED ORDER — VANCOMYCIN HCL IN DEXTROSE 1-5 GM/200ML-% IV SOLN: 1000.0000 mg | Freq: Once | INTRAVENOUS | Status: DC

## 2015-04-03 MED ORDER — PIPERACILLIN-TAZOBACTAM 3.375 G IVPB 30 MIN
3.3750 g | Freq: Once | INTRAVENOUS | Status: AC
  Administered 2015-04-03: 3.375 g via INTRAVENOUS
  Filled 2015-04-03: qty 50

## 2015-04-03 NOTE — Progress Notes (Signed)
ANTIBIOTIC CONSULT NOTE - INITIAL  Pharmacy Consult for Vancomycin & Zosyn Indication: rule out sepsis  Allergies  Allergen Reactions  . Aspirin     REACTION: upset stomach  . Codeine     REACTION: nausea  . Latex    Patient Measurements: Height: 5\' 4"  (162.6 cm) Weight: 130 lb (58.968 kg) IBW/kg (Calculated) : 54.7  Vital Signs: Temp: 100 F (37.8 C) (06/04 1647) Temp Source: Rectal (06/04 1647) BP: 156/69 mmHg (06/04 1647) Pulse Rate: 80 (06/04 1647) Intake/Output from previous day:   Intake/Output from this shift:    Labs: No results for input(s): WBC, HGB, PLT, LABCREA, CREATININE in the last 72 hours. CrCl cannot be calculated (Patient has no serum creatinine result on file.). No results for input(s): VANCOTROUGH, VANCOPEAK, VANCORANDOM, GENTTROUGH, GENTPEAK, GENTRANDOM, TOBRATROUGH, TOBRAPEAK, TOBRARND, AMIKACINPEAK, AMIKACINTROU, AMIKACIN in the last 72 hours.   Microbiology: No results found for this or any previous visit (from the past 720 hour(s)).  Medical History: Past Medical History  Diagnosis Date  . Obstructive chronic bronchitis without exacerbation   . Unspecified essential hypertension   . Unspecified venous (peripheral) insufficiency   . Edema 2005    "since put the glass in them"   . Other abnormal glucose   . Lumbago   . Unspecified nonpsychotic mental disorder   . Unspecified venous (peripheral) insufficiency   . Unspecified vitamin D deficiency   . Anemia, unspecified   . Anxiety   . Arthritis   . Asthma   . Systolic heart failure     EF 35 to 40% per echo January 2015  . Depression   . GERD (gastroesophageal reflux disease)    Medications:  Scheduled:   Anti-infectives    Start     Dose/Rate Route Frequency Ordered Stop   04/03/15 1730  piperacillin-tazobactam (ZOSYN) IVPB 3.375 g     3.375 g 100 mL/hr over 30 Minutes Intravenous  Once 04/03/15 1729     04/03/15 1730  vancomycin (VANCOCIN) IVPB 1000 mg/200 mL premix     1,000  mg 200 mL/hr over 60 Minutes Intravenous  Once 04/03/15 1729       Assessment: 83 yoF found down at home by nephew, on bathroom floor. Hx of HTN, peripheral venous insufficiency. LE ulcers with foul-smelling drainage, LLE swelling, redness.  Vancomycin 1000mg  in ED, Zosyn 3.375gm x1 in ED  Blood cultures sent, no urine culture obtained yet  Goal of Therapy:  Vancomycin trough level 15-20 mcg/ml  Plan:   Follow 1gm Vancomcyin with 750mg  q24  Zosyn 3.375 gm q8hr - 4 hr infusion  Follow cultures, renal function  Minda Ditto PharmD Pager [phone] 04/03/2015, 6:28 PM

## 2015-04-03 NOTE — ED Notes (Signed)
Bed: WA09 Expected date: 04/03/15 Expected time: 4:42 PM Means of arrival: Ambulance Comments: FTT
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
## 2015-04-03 NOTE — ED Notes (Signed)
Hospitalist at bedside 
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
## 2015-04-04 DIAGNOSIS — D500 Iron deficiency anemia secondary to blood loss (chronic): Secondary | ICD-10-CM

## 2015-04-04 DIAGNOSIS — Z86010 Personal history of colonic polyps: Secondary | ICD-10-CM

## 2015-04-04 DIAGNOSIS — F5089 Other specified eating disorder: Secondary | ICD-10-CM | POA: Diagnosis present

## 2015-04-04 DIAGNOSIS — E876 Hypokalemia: Secondary | ICD-10-CM | POA: Diagnosis not present

## 2015-04-04 DIAGNOSIS — F22 Delusional disorders: Secondary | ICD-10-CM

## 2015-04-04 DIAGNOSIS — F508 Other eating disorders: Secondary | ICD-10-CM

## 2015-04-04 DIAGNOSIS — J45909 Unspecified asthma, uncomplicated: Secondary | ICD-10-CM | POA: Diagnosis present

## 2015-04-04 DIAGNOSIS — D126 Benign neoplasm of colon, unspecified: Secondary | ICD-10-CM

## 2015-04-04 LAB — BASIC METABOLIC PANEL
ANION GAP: 7 (ref 5–15)
BUN: 27 mg/dL — ABNORMAL HIGH (ref 6–20)
CALCIUM: 8.7 mg/dL — AB (ref 8.9–10.3)
CO2: 24 mmol/L (ref 22–32)
CREATININE: 1.12 mg/dL — AB (ref 0.44–1.00)
Chloride: 113 mmol/L — ABNORMAL HIGH (ref 101–111)
GFR calc non Af Amer: 45 mL/min — ABNORMAL LOW (ref >60)
GFR, EST AFRICAN AMERICAN: 52 mL/min — AB (ref >60)
Glucose, Bld: 227 mg/dL — ABNORMAL HIGH (ref 65–99)
Potassium: 3.3 mmol/L — ABNORMAL LOW (ref 3.5–5.1)
SODIUM: 144 mmol/L (ref 135–145)

## 2015-04-04 LAB — URINE CULTURE
CULTURE: NO GROWTH
Colony Count: NO GROWTH
Special Requests: NORMAL

## 2015-04-04 LAB — PREPARE RBC (CROSSMATCH)

## 2015-04-04 LAB — HEMOGLOBIN AND HEMATOCRIT, BLOOD
HEMATOCRIT: 28.5 % — AB (ref 36.0–46.0)
HEMOGLOBIN: 9.3 g/dL — AB (ref 12.0–15.0)

## 2015-04-04 MED ORDER — ALBUTEROL SULFATE (2.5 MG/3ML) 0.083% IN NEBU: 2.5000 mg | INHALATION_SOLUTION | RESPIRATORY_TRACT | Status: DC | PRN

## 2015-04-04 MED ORDER — ACETAMINOPHEN 325 MG PO TABS: 650.0000 mg | ORAL_TABLET | Freq: Four times a day (QID) | ORAL | Status: DC | PRN

## 2015-04-04 MED ORDER — ALPRAZOLAM 0.25 MG PO TABS
0.2500 mg | ORAL_TABLET | Freq: Three times a day (TID) | ORAL | Status: DC | PRN
  Administered 2015-04-04 – 2015-04-06 (×3): 0.25 mg via ORAL
  Filled 2015-04-04 (×3): qty 1

## 2015-04-04 MED ORDER — ONDANSETRON HCL 4 MG PO TABS: 4.0000 mg | ORAL_TABLET | Freq: Four times a day (QID) | ORAL | Status: DC | PRN

## 2015-04-04 MED ORDER — VANCOMYCIN HCL IN DEXTROSE 1-5 GM/200ML-% IV SOLN
1000.0000 mg | Freq: Once | INTRAVENOUS | Status: AC
  Administered 2015-04-04: 1000 mg via INTRAVENOUS
  Filled 2015-04-04: qty 200

## 2015-04-04 MED ORDER — TORSEMIDE 20 MG PO TABS
40.0000 mg | ORAL_TABLET | Freq: Two times a day (BID) | ORAL | Status: DC
Start: 2015-04-04 — End: 2015-04-06
  Administered 2015-04-04 – 2015-04-06 (×5): 40 mg via ORAL
  Filled 2015-04-04 (×7): qty 2

## 2015-04-04 MED ORDER — HYDROCODONE-ACETAMINOPHEN 5-325 MG PO TABS
1.0000 | ORAL_TABLET | ORAL | Status: DC | PRN
  Administered 2015-04-04 – 2015-04-06 (×4): 1 via ORAL
  Filled 2015-04-04 (×4): qty 1

## 2015-04-04 MED ORDER — ONDANSETRON HCL 4 MG/2ML IJ SOLN: 4.0000 mg | Freq: Four times a day (QID) | INTRAMUSCULAR | Status: DC | PRN

## 2015-04-04 MED ORDER — SODIUM CHLORIDE 0.9 % IV SOLN
INTRAVENOUS | Status: DC
  Administered 2015-04-04: 17:00:00 via INTRAVENOUS

## 2015-04-04 MED ORDER — HYDROCODONE-ACETAMINOPHEN 5-325 MG PO TABS: 1.0000 | ORAL_TABLET | ORAL | Status: DC | PRN

## 2015-04-04 MED ORDER — ALUM & MAG HYDROXIDE-SIMETH 200-200-20 MG/5ML PO SUSP: 30.0000 mL | Freq: Four times a day (QID) | ORAL | Status: DC | PRN

## 2015-04-04 MED ORDER — SODIUM CHLORIDE 0.9 % IV SOLN
510.0000 mg | Freq: Once | INTRAVENOUS | Status: AC
  Administered 2015-04-04: 510 mg via INTRAVENOUS
  Filled 2015-04-04: qty 17

## 2015-04-04 MED ORDER — SODIUM CHLORIDE 0.9 % IV SOLN
INTRAVENOUS | Status: DC
  Administered 2015-04-05: 125 mL/h via INTRAVENOUS

## 2015-04-04 MED ORDER — ALBUTEROL SULFATE (2.5 MG/3ML) 0.083% IN NEBU
2.5000 mg | INHALATION_SOLUTION | Freq: Once | RESPIRATORY_TRACT | Status: AC
  Administered 2015-04-04: 2.5 mg via RESPIRATORY_TRACT
  Filled 2015-04-04: qty 3

## 2015-04-04 MED ORDER — POTASSIUM CHLORIDE CRYS ER 20 MEQ PO TBCR
40.0000 meq | EXTENDED_RELEASE_TABLET | Freq: Once | ORAL | Status: AC
  Administered 2015-04-04: 40 meq via ORAL
  Filled 2015-04-04: qty 2

## 2015-04-04 MED ORDER — SODIUM CHLORIDE 0.9 % IV SOLN
INTRAVENOUS | Status: DC
  Administered 2015-04-04: 07:00:00 via INTRAVENOUS

## 2015-04-04 MED ORDER — FLEET ENEMA 7-19 GM/118ML RE ENEM
2.0000 | ENEMA | RECTAL | Status: AC
  Administered 2015-04-05: 2 via RECTAL
  Filled 2015-04-04: qty 2
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
## 2015-04-04 NOTE — Progress Notes (Signed)
[facility] PROGRESS NOTE  [name] GYJ:[id number] DOB: 10-13-1932 DOA: 04/03/2015 PCP: [name], MD  Assessment/Plan:  Principal Problem:   Anemia: acute on chronic.5.4 on admission. hgb was 9.4 in April. 9.3 today after 2 units pRBC. Reports compliant with iron.  Heme neg here. Was scheduled for f/u flex sig in March, but cancelled due to "too sick with vomiting". Colonoscopy last year found tubular adenoma with high grade dysplasia. Given age, lives alone, psych issues, would be reasonable to do here. Discussed with [facility].  Anemia may be related.  Active Problems:   Lymphedema of lower extremity with elephantiasis:  Doesn't look cellulitic or any ulcers. No fever or leukocytosis. Will d/c abx and monitor.  Warm betadine soaks bid.   Paranoid delusion:  Clearly documented in chart and previous visits. Calm and cooperative, but fixed delusions about her son in law who no longer lives in town.    Essential hypertension   MGUS (monoclonal gammopathy of unknown significance)   Chronic systolic heart failure: compensated.  EF in 2015 34-40% and grade 2 diastolic dysfunction   CKD (chronic kidney disease) stage 3, GFR 30-59 ml/min stable   Dehydration euvolemic now   Asthma, chronic: pt requesting neb. Will order   history of Tubular adenoma of colon   history of Psychogenic vomiting: none currently  HPI/Subjective: Denies change in leg appearance.  C/o chest tightness, requesting inhaler  Objective: Filed Vitals:   04/04/15 0400  BP: 154/78  Pulse: 70  Temp: 97.7 F (36.5 C)  Resp: 16    Intake/Output Summary (Last 24 hours) at 04/04/15 1115 Last data filed at 04/04/15 0700  Gross per 24 hour  Intake   1250 ml  Output      0 ml  Net   1250 ml   Filed Weights   04/03/15 1647 04/04/15 0102  Weight: 58.968 kg (130 lb) 57.38 kg (126 lb 8 oz)    Exam:   General:  Comfortable, pleasant, talkative. No respiratory distress noted  Cardiovascular: RRR  without MGR  Respiratory: CTA without WRR  Abdomen: S, NT, ND  Ext: left greater than right chronic lymphedema with nodularity, hyperpigmentation, scaling.  Toenails extremely long. No ulceration that i can appreciate  Psych: cooperative and calm, but paranoid delusions about son in law.  Basic Metabolic Panel:  Recent Labs Lab 04/03/15 1731 04/04/15 0634  NA 147* 144  K 3.7 3.3*  CL 120* 113*  CO2 20* 24  GLUCOSE 110* 227*  BUN 30* 27*  CREATININE 1.28* 1.12*  CALCIUM 9.0 8.7*   Liver Function Tests:  Recent Labs Lab 04/03/15 1731  AST 34  ALT 23  ALKPHOS 71  BILITOT 1.1  PROT 8.5*  ALBUMIN 2.5*   No results for input(s): LIPASE, AMYLASE in the last 168 hours. No results for input(s): AMMONIA in the last 168 hours. CBC:  Recent Labs Lab 04/03/15 1731 04/04/15 0634  WBC 22.6*  --   NEUTROABS 20.7*  --   HGB 5.4* 9.3*  HCT 17.1* 28.5*  MCV 82.6  --   PLT 378  --    Cardiac Enzymes: No results for input(s): CKTOTAL, CKMB, CKMBINDEX, TROPONINI in the last 168 hours. BNP (last 3 results) No results for input(s): BNP in the last 8760 hours.  ProBNP (last 3 results)  Recent Labs  04/14/14 1622 02/11/15 1405  PROBNP 191.0* 262.0*    CBG: No results for input(s): GLUCAP in the last 168 hours.  Recent Results (from the past  240 hour(s))  Culture, blood (routine x 2)     Status: None (Preliminary result)   Collection Time: 04/03/15  5:32 PM  Result Value Ref Range Status   Specimen Description RIGHT ANTECUBITAL  Final   Special Requests BOTTLES DRAWN AEROBIC AND ANAEROBIC 5CC  Final   Culture   Final           BLOOD CULTURE RECEIVED NO GROWTH TO DATE CULTURE WILL BE HELD FOR 5 DAYS BEFORE ISSUING A FINAL NEGATIVE REPORT Performed at [facility]    Report Status PENDING  Incomplete     Studies: Dg Chest 2 View  04/03/2015   CLINICAL DATA:  Severe swelling involving the left lower extremity with pain and redness.  EXAM: LEFT FOOT -  COMPLETE 3+ VIEW; LEFT TIBIA AND FIBULA - 2 VIEW; CHEST - 2 VIEW  COMPARISON:  Chest x-ray 02/11/2015  FINDINGS: Chest x-ray:  The heart is enlarged but stable. There is tortuosity and ectasia of the thoracic aorta. Chronic emphysematous and bronchitic lung changes but no definite acute pulmonary findings.  Left tibia/ fibula:  Diffuse soft tissue swelling/ edema involving the lower aspect of the tibia and fibula. No destructive bony changes.  Left foot:  Severe diffuse soft tissue swelling. Advanced degenerative changes at the first metatarsal phalangeal joint. I do not see any obvious destructive bony changes to suggest osteomyelitis.  IMPRESSION: 1. No acute cardiopulmonary findings. 2. Severe diffuse soft tissue swelling involving the left lower extremity. No definite findings for osteomyelitis.   Electronically Signed   By: Marijo Sanes M.D.   On: 04/03/2015 18:57   Dg Tibia/fibula Left  04/03/2015   CLINICAL DATA:  Severe swelling involving the left lower extremity with pain and redness.  EXAM: LEFT FOOT - COMPLETE 3+ VIEW; LEFT TIBIA AND FIBULA - 2 VIEW; CHEST - 2 VIEW  COMPARISON:  Chest x-ray 02/11/2015  FINDINGS: Chest x-ray:  The heart is enlarged but stable. There is tortuosity and ectasia of the thoracic aorta. Chronic emphysematous and bronchitic lung changes but no definite acute pulmonary findings.  Left tibia/ fibula:  Diffuse soft tissue swelling/ edema involving the lower aspect of the tibia and fibula. No destructive bony changes.  Left foot:  Severe diffuse soft tissue swelling. Advanced degenerative changes at the first metatarsal phalangeal joint. I do not see any obvious destructive bony changes to suggest osteomyelitis.  IMPRESSION: 1. No acute cardiopulmonary findings. 2. Severe diffuse soft tissue swelling involving the left lower extremity. No definite findings for osteomyelitis.   Electronically Signed   By: Marijo Sanes M.D.   On: 04/03/2015 18:57   Dg Foot Complete  Left  04/03/2015   CLINICAL DATA:  Severe swelling involving the left lower extremity with pain and redness.  EXAM: LEFT FOOT - COMPLETE 3+ VIEW; LEFT TIBIA AND FIBULA - 2 VIEW; CHEST - 2 VIEW  COMPARISON:  Chest x-ray 02/11/2015  FINDINGS: Chest x-ray:  The heart is enlarged but stable. There is tortuosity and ectasia of the thoracic aorta. Chronic emphysematous and bronchitic lung changes but no definite acute pulmonary findings.  Left tibia/ fibula:  Diffuse soft tissue swelling/ edema involving the lower aspect of the tibia and fibula. No destructive bony changes.  Left foot:  Severe diffuse soft tissue swelling. Advanced degenerative changes at the first metatarsal phalangeal joint. I do not see any obvious destructive bony changes to suggest osteomyelitis.  IMPRESSION: 1. No acute cardiopulmonary findings. 2. Severe diffuse soft tissue swelling involving the left lower  extremity. No definite findings for osteomyelitis.   Electronically Signed   By: Marijo Sanes M.D.   On: 04/03/2015 18:57    Scheduled Meds: . piperacillin-tazobactam (ZOSYN)  IV  3.375 g Intravenous Q8H  . vancomycin  750 mg Intravenous Q24H   Continuous Infusions: . sodium chloride 75 mL/hr at 04/04/15 0644    Time spent: 35 minutes  [facility] Pager [phone] [email or web address], password [email or web address] 04/04/2015, 11:15 AM  LOS: 1 day

## 2015-04-05 ENCOUNTER — Encounter (HOSPITAL_COMMUNITY)
Admission: EM | Disposition: A | Discharge status: Skilled Nursing Facility | Payer: Self-pay | Source: Home / Self Care | Attending: Internal Medicine

## 2015-04-05 ENCOUNTER — Encounter (HOSPITAL_COMMUNITY): Payer: Self-pay | Admitting: Gastroenterology

## 2015-04-05 DIAGNOSIS — R112 Nausea with vomiting, unspecified: Secondary | ICD-10-CM

## 2015-04-05 DIAGNOSIS — D128 Benign neoplasm of rectum: Secondary | ICD-10-CM | POA: Diagnosis present

## 2015-04-05 DIAGNOSIS — K621 Rectal polyp: Secondary | ICD-10-CM

## 2015-04-05 DIAGNOSIS — D580 Hereditary spherocytosis: Secondary | ICD-10-CM

## 2015-04-05 HISTORY — PX: FLEXIBLE SIGMOIDOSCOPY: SHX5431

## 2015-04-05 HISTORY — PX: ESOPHAGOGASTRODUODENOSCOPY: SHX5428

## 2015-04-05 LAB — CBC
HEMATOCRIT: 30.1 % — AB (ref 36.0–46.0)
HEMOGLOBIN: 9.9 g/dL — AB (ref 12.0–15.0)
MCH: 26.5 pg (ref 26.0–34.0)
MCHC: 32.9 g/dL (ref 30.0–36.0)
MCV: 80.7 fL (ref 78.0–100.0)
Platelets: 273 10*3/uL (ref 150–400)
RBC: 3.73 MIL/uL — ABNORMAL LOW (ref 3.87–5.11)
RDW: 16.2 % — ABNORMAL HIGH (ref 11.5–15.5)
WBC: 10.4 10*3/uL (ref 4.0–10.5)

## 2015-04-05 LAB — BASIC METABOLIC PANEL
Anion gap: 10 (ref 5–15)
BUN: 24 mg/dL — AB (ref 6–20)
CALCIUM: 9.1 mg/dL (ref 8.9–10.3)
CO2: 26 mmol/L (ref 22–32)
CREATININE: 1.05 mg/dL — AB (ref 0.44–1.00)
Chloride: 107 mmol/L (ref 101–111)
GFR calc Af Amer: 56 mL/min — ABNORMAL LOW (ref >60)
GFR calc non Af Amer: 48 mL/min — ABNORMAL LOW (ref >60)
Glucose, Bld: 96 mg/dL (ref 65–99)
Potassium: 3.2 mmol/L — ABNORMAL LOW (ref 3.5–5.1)
Sodium: 143 mmol/L (ref 135–145)

## 2015-04-05 SURGERY — SIGMOIDOSCOPY, FLEXIBLE
Anesthesia: Moderate Sedation

## 2015-04-05 SURGERY — EGD (ESOPHAGOGASTRODUODENOSCOPY)
Anesthesia: Moderate Sedation

## 2015-04-05 MED ORDER — MIDAZOLAM HCL 5 MG/ML IJ SOLN
INTRAMUSCULAR | Status: AC
  Filled 2015-04-05: qty 2

## 2015-04-05 MED ORDER — MIDAZOLAM HCL 10 MG/2ML IJ SOLN
INTRAMUSCULAR | Status: DC | PRN
  Administered 2015-04-05: 2 mg via INTRAVENOUS
  Administered 2015-04-05 (×2): 1 mg via INTRAVENOUS
  Administered 2015-04-05: 2 mg via INTRAVENOUS

## 2015-04-05 MED ORDER — ALBUTEROL SULFATE (2.5 MG/3ML) 0.083% IN NEBU
2.5000 mg | INHALATION_SOLUTION | Freq: Two times a day (BID) | RESPIRATORY_TRACT | Status: DC
  Administered 2015-04-05 – 2015-04-06 (×2): 2.5 mg via RESPIRATORY_TRACT
  Filled 2015-04-05 (×4): qty 3

## 2015-04-05 MED ORDER — DIPHENHYDRAMINE HCL 50 MG/ML IJ SOLN
INTRAMUSCULAR | Status: AC
  Filled 2015-04-05: qty 1

## 2015-04-05 MED ORDER — FENTANYL CITRATE (PF) 100 MCG/2ML IJ SOLN
INTRAMUSCULAR | Status: DC | PRN
  Administered 2015-04-05 (×2): 25 ug via INTRAVENOUS

## 2015-04-05 MED ORDER — POVIDONE-IODINE 10 % EX SOLN
Freq: Two times a day (BID) | CUTANEOUS | Status: DC
  Administered 2015-04-05 – 2015-04-06 (×3): via TOPICAL
  Filled 2015-04-05: qty 118

## 2015-04-05 MED ORDER — LIP MEDEX EX OINT
TOPICAL_OINTMENT | CUTANEOUS | Status: AC
  Administered 2015-04-05: 12:00:00
  Filled 2015-04-05: qty 7

## 2015-04-05 MED ORDER — FENTANYL CITRATE (PF) 100 MCG/2ML IJ SOLN
INTRAMUSCULAR | Status: AC
  Filled 2015-04-05: qty 2

## 2015-04-05 NOTE — Care Management Note (Signed)
Case Management Note  Patient Details  Name: [name] MRN: [id number] Date of Birth: 04/27/1932  Subjective/Objective:                  81 yo female admitted with anemia  Action/Plan:  Patient lives at home alone and uses a walker. She stated that when she is discharged she will return to home alone as she does not have anyone that can stay with her. Discussed PT's recommendation for SNF and patient is agreeable, CSW made aware. Expected Discharge Date:  04/08/15               Expected Discharge Plan:  Skilled Nursing Facility  In-House Referral:  Clinical Social Work  Discharge planning Services  CM Consult  Post Acute Care Choice:    Choice offered to:     DME Arranged:    DME Agency:     HH Arranged:    [place] Agency:     Status of Service:  In process, will continue to follow  Medicare Important Message Given:  N/A - LOS <3 / Initial given by admissions Date Medicare IM Given:    Medicare IM give by:    Date Additional Medicare IM Given:    Additional Medicare Important Message give by:     If discussed at [place] of Stay Meetings, dates discussed:    Additional Comments:  Scot Dock, RN 04/05/2015, 1:35 PM
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
## 2015-04-05 NOTE — Op Note (Addendum)
[facility] [place], [address]   ENDOSCOPY PROCEDURE REPORT  PATIENT: [name], [name]  MR#: [id number] BIRTHDATE: 19-Aug-1932 , 24  yrs. old GENDER: female ENDOSCOPIST: [name], MD, [facility] REFERRED BY:  [facility], [facility] PROCEDURE DATE:  04/05/2015 PROCEDURE:  EGD, diagnostic ASA CLASS:     Class III INDICATIONS:  nausea, vomiting, and iron deficiency anemia. MEDICATIONS: Residual sedation present and Versed 2 mg IV TOPICAL ANESTHETIC: none DESCRIPTION OF PROCEDURE: After the risks benefits and alternatives of the procedure were thoroughly explained, informed consent was obtained.  The [place] [id number] endoscope was introduced through the mouth and advanced to the second portion of the duodenum , Without limitations.  The instrument was slowly withdrawn as the mucosa was fully examined.    ESOPHAGUS: The mucosa of the esophagus appeared normal. STOMACH: The mucosa of the stomach appeared normal. DUODENUM: The duodenal mucosa showed no abnormalities in the bulb and 2nd part of the duodenum.  Retroflexed views revealed no abnormalities.   The scope was then withdrawn from the patient and the procedure completed.  COMPLICATIONS: There were no immediate complications.  ENDOSCOPIC IMPRESSION: 1.   The EGD appeared normal  RECOMMENDATIONS: 1.  Consider IV Fe replacment - decision per primary service. 2.  GI signing off. Outpatient GI follow up with Dr. Doretha Sou in 4-6 weeks.  eSigned:  [name], MD, [facility] 04/05/2015 4:49 PM Revised: 04/05/2015 4:49 PM

## 2015-04-05 NOTE — Progress Notes (Signed)
[facility] PROGRESS NOTE  [name] DPO:[id number] DOB: [birth year] DOA: 04/03/2015 PCP: [name], MD  Summary 75 female with chronic paranoid delusions, tubular adenoma of colon, recurrent psychogenic vomiting, lymphedema of legs, chronic anemia found on bathroom floor by family members.  hgb found to be 5. Stool heme neg. Transfused 2 units prbc.  Assessment/Plan:  Principal Problem:   Anemia: acute on chronic.5.4 on admission. hgb was 9.4 in April. 9.3 today after 2 units pRBC. Reports compliant with iron.  Heme neg here. Was scheduled for f/u flex sig in March, but cancelled due to "too sick with vomiting". Colonoscopy last year found tubular adenoma with high grade dysplasia. For flex sig today. PT recommends skilled nursing facility. Patient agreeable. Will consult social work. Active Problems:   Lymphedema of lower extremity with elephantiasis:  Doesn't look cellulitic or any ulcers. No fever or leukocytosis. Antibiotics stopped yesterday.  Warm betadine soaks bid.   Paranoid delusion:  Clearly documented in chart and previous visits. Calm and cooperative, but fixed delusions about her son in law who no longer lives in town.    Essential hypertension   MGUS (monoclonal gammopathy of unknown significance)   Chronic systolic heart failure: compensated.  EF in 2015 34-40% and grade 2 diastolic dysfunction   CKD (chronic kidney disease) stage 3, GFR 30-59 ml/min stable   Dehydration euvolemic now   Asthma, chronic: pt requesting neb. Will order   history of Tubular adenoma of colon   history of Psychogenic vomiting: none currently Loose stools overnight per nursing report, none today. C. difficile ordered per nurse protocol.  HPI/Subjective: Denies loose stool today. No bleeding. Requesting scheduled inhalers.  Objective: Filed Vitals:   04/05/15 0649  BP: 135/91  Pulse: 84  Temp: 98.4 F (36.9 C)  Resp: 18    Intake/Output Summary (Last 24 hours) at 04/05/15  0925 Last data filed at 04/05/15 0905  Gross per 24 hour  Intake    240 ml  Output      0 ml  Net    240 ml   Filed Weights   04/03/15 1647 04/04/15 0102  Weight: 58.968 kg (130 lb) 57.38 kg (126 lb 8 oz)    Exam:   General:  Comfortable, pleasant, talkative. No respiratory distress noted  Cardiovascular: RRR without MGR  Respiratory: CTA without WRR  Abdomen: S, NT, ND  Ext: left greater than right chronic lymphedema with nodularity, hyperpigmentation, scaling.  Toenails extremely long. No ulceration that i can appreciate. Edema improved today.  Psych: cooperative and calm, no delusions voiced  Basic Metabolic Panel:  Recent Labs Lab 04/03/15 1731 04/04/15 0634 04/05/15 0515  NA 147* 144 143  K 3.7 3.3* 3.2*  CL 120* 113* 107  CO2 20* 24 26  GLUCOSE 110* 227* 96  BUN 30* 27* 24*  CREATININE 1.28* 1.12* 1.05*  CALCIUM 9.0 8.7* 9.1   Liver Function Tests:  Recent Labs Lab 04/03/15 1731  AST 34  ALT 23  ALKPHOS 71  BILITOT 1.1  PROT 8.5*  ALBUMIN 2.5*   No results for input(s): LIPASE, AMYLASE in the last 168 hours. No results for input(s): AMMONIA in the last 168 hours. CBC:  Recent Labs Lab 04/03/15 1731 04/04/15 0634 04/05/15 0515  WBC 22.6*  --  10.4  NEUTROABS 20.7*  --   --   HGB 5.4* 9.3* 9.9*  HCT 17.1* 28.5* 30.1*  MCV 82.6  --  80.7  PLT 378  --  273   Cardiac Enzymes:  No results for input(s): CKTOTAL, CKMB, CKMBINDEX, TROPONINI in the last 168 hours. BNP (last 3 results) No results for input(s): BNP in the last 8760 hours.  ProBNP (last 3 results)  Recent Labs  04/14/14 1622 02/11/15 1405  PROBNP 191.0* 262.0*    CBG: No results for input(s): GLUCAP in the last 168 hours.  Recent Results (from the past 240 hour(s))  Culture, blood (routine x 2)     Status: None (Preliminary result)   Collection Time: 04/03/15  5:32 PM  Result Value Ref Range Status   Specimen Description RIGHT ANTECUBITAL  Final   Special Requests  BOTTLES DRAWN AEROBIC AND ANAEROBIC 5CC  Final   Culture   Final           BLOOD CULTURE RECEIVED NO GROWTH TO DATE CULTURE WILL BE HELD FOR 5 DAYS BEFORE ISSUING A FINAL NEGATIVE REPORT Performed at [facility]    Report Status PENDING  Incomplete  Culture, blood (routine x 2)     Status: None (Preliminary result)   Collection Time: 04/03/15  5:41 PM  Result Value Ref Range Status   Specimen Description LEFT ANTECUBITAL  Final   Special Requests BOTTLES DRAWN AEROBIC AND ANAEROBIC 5 CC  Final   Culture   Final           BLOOD CULTURE RECEIVED NO GROWTH TO DATE CULTURE WILL BE HELD FOR 5 DAYS BEFORE ISSUING A FINAL NEGATIVE REPORT Performed at [facility]    Report Status PENDING  Incomplete  Urine culture     Status: None   Collection Time: 04/03/15  6:30 PM  Result Value Ref Range Status   Specimen Description URINE, CATHETERIZED  Final   Special Requests Normal  Final   Colony Count NO GROWTH Performed at [facility]   Final   Culture NO GROWTH Performed at [facility]   Final   Report Status 04/04/2015 FINAL  Final     Studies: Dg Chest 2 View  04/03/2015   CLINICAL DATA:  Severe swelling involving the left lower extremity with pain and redness.  EXAM: LEFT FOOT - COMPLETE 3+ VIEW; LEFT TIBIA AND FIBULA - 2 VIEW; CHEST - 2 VIEW  COMPARISON:  Chest x-ray 02/11/2015  FINDINGS: Chest x-ray:  The heart is enlarged but stable. There is tortuosity and ectasia of the thoracic aorta. Chronic emphysematous and bronchitic lung changes but no definite acute pulmonary findings.  Left tibia/ fibula:  Diffuse soft tissue swelling/ edema involving the lower aspect of the tibia and fibula. No destructive bony changes.  Left foot:  Severe diffuse soft tissue swelling. Advanced degenerative changes at the first metatarsal phalangeal joint. I do not see any obvious destructive bony changes to suggest osteomyelitis.  IMPRESSION: 1. No acute cardiopulmonary findings.  2. Severe diffuse soft tissue swelling involving the left lower extremity. No definite findings for osteomyelitis.   Electronically Signed   By: Marijo Sanes M.D.   On: 04/03/2015 18:57   Dg Tibia/fibula Left  04/03/2015   CLINICAL DATA:  Severe swelling involving the left lower extremity with pain and redness.  EXAM: LEFT FOOT - COMPLETE 3+ VIEW; LEFT TIBIA AND FIBULA - 2 VIEW; CHEST - 2 VIEW  COMPARISON:  Chest x-ray 02/11/2015  FINDINGS: Chest x-ray:  The heart is enlarged but stable. There is tortuosity and ectasia of the thoracic aorta. Chronic emphysematous and bronchitic lung changes but no definite acute pulmonary findings.  Left tibia/ fibula:  Diffuse soft tissue  swelling/ edema involving the lower aspect of the tibia and fibula. No destructive bony changes.  Left foot:  Severe diffuse soft tissue swelling. Advanced degenerative changes at the first metatarsal phalangeal joint. I do not see any obvious destructive bony changes to suggest osteomyelitis.  IMPRESSION: 1. No acute cardiopulmonary findings. 2. Severe diffuse soft tissue swelling involving the left lower extremity. No definite findings for osteomyelitis.   Electronically Signed   By: Marijo Sanes M.D.   On: 04/03/2015 18:57   Dg Foot Complete Left  04/03/2015   CLINICAL DATA:  Severe swelling involving the left lower extremity with pain and redness.  EXAM: LEFT FOOT - COMPLETE 3+ VIEW; LEFT TIBIA AND FIBULA - 2 VIEW; CHEST - 2 VIEW  COMPARISON:  Chest x-ray 02/11/2015  FINDINGS: Chest x-ray:  The heart is enlarged but stable. There is tortuosity and ectasia of the thoracic aorta. Chronic emphysematous and bronchitic lung changes but no definite acute pulmonary findings.  Left tibia/ fibula:  Diffuse soft tissue swelling/ edema involving the lower aspect of the tibia and fibula. No destructive bony changes.  Left foot:  Severe diffuse soft tissue swelling. Advanced degenerative changes at the first metatarsal phalangeal joint. I do not see  any obvious destructive bony changes to suggest osteomyelitis.  IMPRESSION: 1. No acute cardiopulmonary findings. 2. Severe diffuse soft tissue swelling involving the left lower extremity. No definite findings for osteomyelitis.   Electronically Signed   By: Marijo Sanes M.D.   On: 04/03/2015 18:57    Scheduled Meds: . sodium phosphate  2 enema Rectal On Call  . torsemide  40 mg Oral BID   Continuous Infusions: . sodium chloride    . sodium chloride 20 mL/hr at 04/04/15 1720    Time spent: 35 minutes  [facility] Pager [phone] [email or web address], password [facility] 04/05/2015, 9:25 AM  LOS: 2 days

## 2015-04-05 NOTE — Plan of Care (Signed)
Problem: Phase I Progression Outcomes Goal: OOB as tolerated unless otherwise ordered Outcome: Progressing Patient stood up beside bed with PT.

## 2015-04-05 NOTE — Interval H&P Note (Signed)
History and Physical Interval Note:  04/05/2015 3:25 PM  [name]  has presented today for surgery, with the diagnosis of iron deficiency anemia  The various methods of treatment have been discussed with the patient and family. After consideration of risks, benefits and other options for treatment, the patient has consented to  Procedure(s): ESOPHAGOGASTRODUODENOSCOPY (EGD) (N/A) FLEXIBLE SIGMOIDOSCOPY (N/A) as a surgical intervention .  The patient's history has been reviewed, patient examined, no change in status, stable for surgery.  I have reviewed the patient's chart and labs.  Questions were answered to the patient's satisfaction.     Pricilla Riffle. Fuller Plan
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
## 2015-04-05 NOTE — Clinical Social Work Placement (Signed)
   CLINICAL SOCIAL WORK PLACEMENT  NOTE  Date:  04/05/2015  Patient Details  Name: CALEA HRIBAR MRN: [id number] Date of Birth: 03-11-1932  Clinical Social Work is seeking post-discharge placement for this patient at the [place] level of care (*CSW will initial, date and re-position this form in  chart as items are completed):  Yes   Patient/family provided with [place] Work Department's list of facilities offering this level of care within the geographic area requested by the patient (or if unable, by the patient's family).  Yes   Patient/family informed of their freedom to choose among providers that offer the needed level of care, that participate in Medicare, Medicaid or managed care program needed by the patient, have an available bed and are willing to accept the patient.  Yes   Patient/family informed of [facility]'s ownership interest in [facility] and [facility], as well as of the fact that they are under no obligation to receive care at these facilities.  PASRR submitted to EDS on 04/05/15     PASRR number received on 04/05/15     Existing PASRR number confirmed on       FL2 transmitted to all facilities in geographic area requested by pt/family on 04/05/15     FL2 transmitted to all facilities within larger geographic area on       Patient informed that his/her managed care company has contracts with or will negotiate with certain facilities, including the following:            Patient/family informed of bed offers received.  Patient chooses bed at       Physician recommends and patient chooses bed at      Patient to be transferred to   on  .  Patient to be transferred to facility by       Patient family notified on   of transfer.  Name of family member notified:        PHYSICIAN       Additional Comment:    _______________________________________________ Standley Brooking, LCSW 04/05/2015, 3:33 PM

## 2015-04-05 NOTE — Evaluation (Signed)
Physical Therapy Evaluation Patient Details Name: [name] MRN: [id number] DOB: [birth year] Today's Date: 04/05/2015   History of Present Illness  81 yo female adm 04/03/15 with cellulitis adn anemia, pt was found at home on bathroom floor by family after being there for "days"; PMHX:  delusions and paranoia, lymphedema, back surgery  Clinical Impression  Pt admitted with above diagnosis. Pt currently with functional limitations due to the deficits listed below (see PT Problem List). Pt will benefit from skilled PT to increase their independence and safety with mobility to allow discharge to the venue listed below.   Pt requiring mod/max assist for bed mobility and transfers today, she is at risk for continued falls,  recommend SNF at this time if pt agreeable; if she does not improve significantly during stay  And desires to go home she will need incr assist     Follow Up Recommendations SNF (if pt agreeable; HHPT if 24hr assist)    Equipment Recommendations  None recommended by PT    Recommendations for Other Services       Precautions / Restrictions Precautions Precautions: Fall      Mobility  Bed Mobility Overal bed mobility: Needs Assistance Bed Mobility: Supine to Sit     Supine to sit: HOB elevated;Min assist;Mod assist     General bed mobility comments: incr time, assist with LLE and bed pad used to scoot to EOB in sitting  Transfers Overall transfer level: Needs assistance Equipment used: Rolling walker (2 wheeled) Transfers: Stand Pivot Transfers Sit to Stand: Mod assist;Max assist Stand pivot transfers: Mod assist;Max assist       General transfer comment: cues for hand placement and overall safety  Ambulation/Gait Ambulation/Gait assistance: Mod assist;Max assist Ambulation Distance (Feet): 3 Feet Assistive device: Rolling walker (2 wheeled) Gait Pattern/deviations: Step-to pattern;Trunk flexed;Shuffle     General Gait Details: cues for posture,  RW safety, pt requiring incr assist to maintain standing, chair to pt to prevent fall  Stairs            Wheelchair Mobility    Modified Rankin (Stroke Patients Only)       Balance Overall balance assessment: Needs assistance;History of Falls Sitting-balance support: Single extremity supported;Feet supported Sitting balance-Leahy Scale: Fair       Standing balance-Leahy Scale: Zero                               Pertinent Vitals/Pain Pain Assessment: Faces Faces Pain Scale: Hurts little more Pain Location: LLE Pain Descriptors / Indicators: Grimacing    Home Living Family/patient expects to be discharged to:: Private residence Living Arrangements: Alone   Type of Home: House       Home Layout: One level;Laundry or work area in [place]: Kasandra Knudsen - single point;Walker - 2 wheels Additional Comments: pt has supportive family that check on her frequently    Prior Function Level of Independence: Independent with assistive device(s);Independent               Hand Dominance        Extremity/Trunk Assessment               Lower Extremity Assessment: Generalized weakness;LLE deficits/detail   LLE Deficits / Details: grossly 2+ to 3/5, lymphedema      Communication   Communication: No difficulties  Cognition Arousal/Alertness: Awake/alert Behavior During Therapy: WFL for tasks assessed/performed Overall Cognitive Status: Within Functional Limits for tasks  assessed                      General Comments      Exercises        Assessment/Plan    PT Assessment Patient needs continued PT services  PT Diagnosis Difficulty walking;Generalized weakness   PT Problem List Decreased strength;Decreased activity tolerance;Decreased balance;Decreased mobility;Pain  PT Treatment Interventions DME instruction;Gait training;Functional mobility training;Therapeutic activities;Therapeutic exercise;Patient/family education    PT Goals (Current goals can be found in the Care Plan section) Acute Rehab PT Goals Patient Stated Goal: to regain independence PT Goal Formulation: With patient Time For Goal Achievement: 04/19/15 Potential to Achieve Goals: Fair    Frequency Min 3X/week   Barriers to discharge        Co-evaluation               End of Session Equipment Utilized During Treatment: Gait belt Activity Tolerance: Patient limited by fatigue;Patient limited by pain Patient left: in chair;with call bell/phone within reach;with nursing/sitter in room Nurse Communication: Mobility status         Time: 1230-1249 PT Time Calculation (min) (ACUTE ONLY): 19 min   Charges:   PT Evaluation $Initial PT Evaluation Tier I: 1 Procedure     PT G CodesKenyon Daniel 2015-04-30, 1:44 PM

## 2015-04-05 NOTE — Op Note (Signed)
[facility] [place], [address]   FLEXIBLE SIGMOIDOSCOPY PROCEDURE REPORT  PATIENT: [name], [name]  MR#: [id number] BIRTHDATE: 02-22-1932 , 46  yrs. old GENDER: female ENDOSCOPIST: [name], MD, [facility] REFERRED BY: [facility] PROCEDURE DATE:  04/05/2015 PROCEDURE:   Sigmoidoscopy with snare polypectomy ASA CLASS:   Class III INDICATIONS:iron deficiency anemia.   surveillance colonoscopy based on a history of rectal adenomatous polyp with HGD in 07/2014. MEDICATIONS: Fentanyl 50 mcg IV and Versed 4 mg IV DESCRIPTION OF PROCEDURE:   After the risks benefits and alternatives of the procedure were thoroughly explained, informed consent was obtained.  Digital exam revealed no abnormalities of the rectum. The     endoscope was introduced through the anus  and advanced to the sigmoid colon , The exam was Without limitations. The quality of the prep was The overall prep quality was fair. . Estimated blood loss is zero unless otherwise noted in this procedure report. The instrument was then slowly withdrawn as the mucosa was fully examined.    COLON FINDINGS: A sessile polyp measuring 6 mm in size was found in the rectum in area with prior tattoo.  A polypectomy was performed with a cold snare.  The resection was complete, the polyp tissue was completely retrieved and sent to histology.   There was mild diverticulosis noted in the sigmoid colon with associated colonic spasm and colonic narrowing.  Retroflexed views revealed no abnormalities.    The scope was then withdrawn from the patient and the procedure terminated.  COMPLICATIONS: There were no immediate complications.  ENDOSCOPIC IMPRESSION: 1.   Sessile polyp in the rectum; polypectomy performed with a cold snare 2.   Mild diverticulosis in the sigmoid colon  RECOMMENDATIONS: 1.  Await biopsy results 2.  Fiber rich diet 3.  Consider colonoscopy in 07/2017 with Dr. Deatra Ina  however given age, health status may want to defer colonoscopy - decision per Dr. Doretha Sou   eSigned:  [name], MD, [facility] 04/05/2015 4:40 PM

## 2015-04-06 ENCOUNTER — Emergency Department (HOSPITAL_COMMUNITY)
Admission: EM | Admit: 2015-04-06 | Discharge: 2015-04-07 | Disposition: A | Discharge status: Home / Self Care | Payer: Medicare Other | Attending: Emergency Medicine | Admitting: Emergency Medicine

## 2015-04-06 ENCOUNTER — Emergency Department (HOSPITAL_COMMUNITY): Payer: Medicare Other

## 2015-04-06 ENCOUNTER — Telehealth: Payer: Self-pay | Admitting: Gastroenterology

## 2015-04-06 ENCOUNTER — Encounter (HOSPITAL_COMMUNITY): Payer: Self-pay | Admitting: Gastroenterology

## 2015-04-06 DIAGNOSIS — M6281 Muscle weakness (generalized): Secondary | ICD-10-CM | POA: Diagnosis not present

## 2015-04-06 DIAGNOSIS — I890 Lymphedema, not elsewhere classified: Secondary | ICD-10-CM | POA: Diagnosis not present

## 2015-04-06 DIAGNOSIS — S29001A Unspecified injury of muscle and tendon of front wall of thorax, initial encounter: Secondary | ICD-10-CM | POA: Insufficient documentation

## 2015-04-06 DIAGNOSIS — B351 Tinea unguium: Secondary | ICD-10-CM | POA: Diagnosis not present

## 2015-04-06 DIAGNOSIS — Z79899 Other long term (current) drug therapy: Secondary | ICD-10-CM | POA: Diagnosis not present

## 2015-04-06 DIAGNOSIS — I739 Peripheral vascular disease, unspecified: Secondary | ICD-10-CM | POA: Diagnosis not present

## 2015-04-06 DIAGNOSIS — Z91040 Latex allergy status: Secondary | ICD-10-CM | POA: Insufficient documentation

## 2015-04-06 DIAGNOSIS — R262 Difficulty in walking, not elsewhere classified: Secondary | ICD-10-CM | POA: Diagnosis not present

## 2015-04-06 DIAGNOSIS — D128 Benign neoplasm of rectum: Secondary | ICD-10-CM | POA: Diagnosis not present

## 2015-04-06 DIAGNOSIS — F23 Brief psychotic disorder: Secondary | ICD-10-CM | POA: Diagnosis not present

## 2015-04-06 DIAGNOSIS — R112 Nausea with vomiting, unspecified: Secondary | ICD-10-CM | POA: Diagnosis not present

## 2015-04-06 DIAGNOSIS — Y9389 Activity, other specified: Secondary | ICD-10-CM | POA: Diagnosis not present

## 2015-04-06 DIAGNOSIS — S01112A Laceration without foreign body of left eyelid and periocular area, initial encounter: Secondary | ICD-10-CM | POA: Insufficient documentation

## 2015-04-06 DIAGNOSIS — S0592XA Unspecified injury of left eye and orbit, initial encounter: Secondary | ICD-10-CM | POA: Diagnosis present

## 2015-04-06 DIAGNOSIS — W19XXXA Unspecified fall, initial encounter: Secondary | ICD-10-CM

## 2015-04-06 DIAGNOSIS — R0781 Pleurodynia: Secondary | ICD-10-CM | POA: Diagnosis not present

## 2015-04-06 DIAGNOSIS — Z23 Encounter for immunization: Secondary | ICD-10-CM | POA: Diagnosis not present

## 2015-04-06 DIAGNOSIS — I10 Essential (primary) hypertension: Secondary | ICD-10-CM | POA: Diagnosis not present

## 2015-04-06 DIAGNOSIS — Y998 Other external cause status: Secondary | ICD-10-CM | POA: Diagnosis not present

## 2015-04-06 DIAGNOSIS — D509 Iron deficiency anemia, unspecified: Secondary | ICD-10-CM | POA: Diagnosis not present

## 2015-04-06 DIAGNOSIS — S299XXA Unspecified injury of thorax, initial encounter: Secondary | ICD-10-CM | POA: Diagnosis not present

## 2015-04-06 DIAGNOSIS — S0083XA Contusion of other part of head, initial encounter: Secondary | ICD-10-CM

## 2015-04-06 DIAGNOSIS — Y9289 Other specified places as the place of occurrence of the external cause: Secondary | ICD-10-CM | POA: Diagnosis not present

## 2015-04-06 DIAGNOSIS — D649 Anemia, unspecified: Secondary | ICD-10-CM | POA: Diagnosis not present

## 2015-04-06 DIAGNOSIS — S0990XA Unspecified injury of head, initial encounter: Secondary | ICD-10-CM | POA: Insufficient documentation

## 2015-04-06 DIAGNOSIS — S0181XA Laceration without foreign body of other part of head, initial encounter: Secondary | ICD-10-CM

## 2015-04-06 DIAGNOSIS — M79672 Pain in left foot: Secondary | ICD-10-CM | POA: Diagnosis not present

## 2015-04-06 DIAGNOSIS — F419 Anxiety disorder, unspecified: Secondary | ICD-10-CM | POA: Diagnosis not present

## 2015-04-06 DIAGNOSIS — W01198A Fall on same level from slipping, tripping and stumbling with subsequent striking against other object, initial encounter: Secondary | ICD-10-CM | POA: Diagnosis not present

## 2015-04-06 DIAGNOSIS — R259 Unspecified abnormal involuntary movements: Secondary | ICD-10-CM | POA: Diagnosis not present

## 2015-04-06 DIAGNOSIS — S098XXA Other specified injuries of head, initial encounter: Secondary | ICD-10-CM | POA: Diagnosis not present

## 2015-04-06 DIAGNOSIS — I872 Venous insufficiency (chronic) (peripheral): Secondary | ICD-10-CM | POA: Diagnosis not present

## 2015-04-06 DIAGNOSIS — F0391 Unspecified dementia with behavioral disturbance: Secondary | ICD-10-CM | POA: Diagnosis not present

## 2015-04-06 DIAGNOSIS — Z86010 Personal history of colonic polyps: Secondary | ICD-10-CM | POA: Diagnosis not present

## 2015-04-06 DIAGNOSIS — I5022 Chronic systolic (congestive) heart failure: Secondary | ICD-10-CM | POA: Diagnosis not present

## 2015-04-06 DIAGNOSIS — E46 Unspecified protein-calorie malnutrition: Secondary | ICD-10-CM | POA: Diagnosis not present

## 2015-04-06 DIAGNOSIS — R51 Headache: Secondary | ICD-10-CM | POA: Diagnosis not present

## 2015-04-06 DIAGNOSIS — Z9181 History of falling: Secondary | ICD-10-CM | POA: Diagnosis not present

## 2015-04-06 DIAGNOSIS — N183 Chronic kidney disease, stage 3 (moderate): Secondary | ICD-10-CM | POA: Diagnosis not present

## 2015-04-06 DIAGNOSIS — J45909 Unspecified asthma, uncomplicated: Secondary | ICD-10-CM | POA: Diagnosis not present

## 2015-04-06 DIAGNOSIS — F22 Delusional disorders: Secondary | ICD-10-CM | POA: Diagnosis not present

## 2015-04-06 DIAGNOSIS — T148 Other injury of unspecified body region: Secondary | ICD-10-CM | POA: Diagnosis not present

## 2015-04-06 DIAGNOSIS — L03116 Cellulitis of left lower limb: Secondary | ICD-10-CM | POA: Diagnosis not present

## 2015-04-06 DIAGNOSIS — D631 Anemia in chronic kidney disease: Secondary | ICD-10-CM | POA: Diagnosis not present

## 2015-04-06 MED ORDER — TETANUS-DIPHTH-ACELL PERTUSSIS 5-2.5-18.5 LF-MCG/0.5 IM SUSP
0.5000 mL | Freq: Once | INTRAMUSCULAR | Status: AC
Start: 2015-04-06 — End: 2015-04-07
  Administered 2015-04-07: 0.5 mL via INTRAMUSCULAR
  Filled 2015-04-06: qty 0.5

## 2015-04-06 MED ORDER — ALPRAZOLAM 0.5 MG PO TABS: ORAL_TABLET | ORAL | Status: DC

## 2015-04-06 MED ORDER — POTASSIUM CHLORIDE CRYS ER 20 MEQ PO TBCR
40.0000 meq | EXTENDED_RELEASE_TABLET | Freq: Once | ORAL | Status: AC
  Administered 2015-04-06: 40 meq via ORAL
  Filled 2015-04-06: qty 2

## 2015-04-06 MED ORDER — TRAMADOL HCL 50 MG PO TABS: 50.0000 mg | ORAL_TABLET | Freq: Three times a day (TID) | ORAL | Status: DC

## 2015-04-06 MED ORDER — LIDOCAINE HCL (PF) 1 % IJ SOLN: 5.0000 mL | Freq: Once | INTRAMUSCULAR | Status: DC

## 2015-04-06 MED ORDER — LIDOCAINE HCL 1 % IJ SOLN
INTRAMUSCULAR | Status: AC
  Filled 2015-04-06: qty 20

## 2015-04-06 MED ORDER — SODIUM CHLORIDE 0.9 % IV SOLN
510.0000 mg | Freq: Once | INTRAVENOUS | Status: AC
  Administered 2015-04-06: 510 mg via INTRAVENOUS
  Filled 2015-04-06: qty 17

## 2015-04-06 NOTE — Progress Notes (Signed)
Patient for d/c today to SNF bed at [facility]. Family and patient agreeable to this plan- plan transfer via family car. Eduard Clos, MSW, [facility] Presser [phone]

## 2015-04-06 NOTE — Telephone Encounter (Signed)
Appointment given to [place] for 04/19/15

## 2015-04-06 NOTE — Discharge Summary (Addendum)
Physician Discharge Summary  [name] QJJ:[id number] DOB: 07/04/1932 DOA: 04/03/2015  PCP: [name], MD  Admit date: 04/03/2015 Discharge date: 04/06/2015  Time spent: 35 minutes  Recommendations for Outpatient Follow-up:  1. Discharged to skilled nursing facility with outpatient GI follow-up. Needs follow-up on biopsy of the rectal polyp done on 6/6.  2. Please monitor H&H in 1 week  Discharge Diagnoses:  Active Problems: Severe iron deficiency anemia  Active problems   Paranoid delusion   Essential hypertension   Venous (peripheral) insufficiency   MGUS (monoclonal gammopathy of unknown significance)   Lymphedema of lower extremity   Chronic systolic heart failure   History of colonic polyps   CKD (chronic kidney disease) stage 3, GFR 30-59 ml/min   Dehydration   Asthma, chronic   history of Tubular adenoma of colon   history of Psychogenic vomiting   Hypokalemia   Anemia, iron deficiency   Rectal benign neoplasm   Discharge Condition:  Fair  Diet recommendation: Heart healthy with high fiber  Filed Weights   04/03/15 1647 04/04/15 0102  Weight: 58.968 kg (130 lb) 57.38 kg (126 lb 8 oz)    History of present illness:  Please refer to admission H&P for details, in brief, 81 year old female with history of chronic edema of legs, chronic anemia, paranoid delusions, tubular adenoma of colon, CK D stage III, MGUS, essential hypertension, chronic systolic CHF with EF of 48-18% who was brought to the ED by family after she was found on the bathroom floor. Hemoglobin on admission was found to be 5 with negative stool for occult blood. Patient admitted to telemetry and transfused 2 units PRBC. Seen by GI and underwent EGD and flexible sigmoidoscopy.  Hospital Course:   Principal problem Severe iron deficiency anemia (acute on chronic) Hemoglobin on admission of 5.4 (baseline of 9.). Patient received 2 units PRBC and improved to 9.3. She is a 9 supplement at home  and reports being compliant. Hemoccults was negative. Flexible sigmoidoscopy done showed sessile polyp in the rectum which was removed and a mild diverticulosis in the sigmoid colon seen. -Patient also underwent EGD which was unremarkable. She is hemodynamically stable . She received a dose of IV iron today as per GI recommendation. Will resume her home iron supplement. -Patient should follow-up with Dr. Deatra Ina in 2 weeks.  Active problem Lymphedema of the left lower leg with elephantiasis No signs of infection. Antibiotic started on admission was discontinued. Recommend warm bed again soaks twice a day  Paranoid delusions Patient clinically stable at this time. Asymptomatic this morning. On previous days reportedly had fixed delusions about her son-in-law who does not live in town. Continue Xanax when necessary for anxiety.  Chronic systolic heart failure Last EF of 35-40% in 2015 with grade 2 diastolic dysfunction. She is compensated and was hypovolemic on presentation. Continue Coreg, torsemide and lisinopril. Continue potassium supplement.  Chronic asthma Continue albuterol inhaler and theophylline  History of loose stools Possibly related to GI prep. Now resolved  Patient is clinically stable at this time to be discharged home with outpatient follow-up  CODE STATUS: Full code  Family communication: None at bedside Procedures:  EGD  Flexible sigmoidoscopy  Consultations:  [facility]  Discharge Exam: Filed Vitals:   04/06/15 0446  BP: 127/69  Pulse: 65  Temp: 98.6 F (37 C)  Resp: 20    General: Elderly female in no acute distress HEENT: Pallor present, moist oral mucosa, supple neck Chest: Clear to auscultation bilaterally, no added sounds  CVS: Normal S1 and S2, no murmurs rub or gallop GI: Soft, nondistended, nontender, bowel sounds present Musculoskeletal: Chronic lymphedema of the left leg, no discharge or foul-smelling  CNS: Alert and oriented  Discharge  Instructions    Current Discharge Medication List    CONTINUE these medications which have CHANGED   Details  ALPRAZolam (XANAX) 0.5 MG tablet TAKE 1/2 TO 1 TABLET BY MOUTH THREE TIMES DAILY AS NEEDED FOR NERVES Qty: 30 tablet, Refills: 0    traMADol (ULTRAM) 50 MG tablet Take 1 tablet (50 mg total) by mouth 3 (three) times daily. Qty: 30 tablet, Refills: 0      CONTINUE these medications which have NOT CHANGED   Details  albuterol (PROAIR HFA) 108 (90 BASE) MCG/ACT inhaler INHALE 2 PUFFS EVERY 4 HOURS AS NEEDED FOR WHEEZING Qty: 8.5 Inhaler, Refills: 2    carvedilol (COREG) 3.125 MG tablet TAKE 1 TABLET BY MOUTH TWICE DAILY Qty: 60 tablet, Refills: 5    Ferrous Sulfate Dried 200 (65 FE) MG TABS Take 1 tablet by mouth 2 (two) times daily. Qty: 30 tablet, Refills: 2    hydrOXYzine (ATARAX/VISTARIL) 25 MG tablet Take 1 tablet (25 mg total) by mouth every 4 (four) hours as needed. For itching Qty: 100 tablet, Refills: 3    lisinopril (PRINIVIL,ZESTRIL) 10 MG tablet Take 1 tablet (10 mg total) by mouth daily. Qty: 90 tablet, Refills: 2    silver sulfADIAZINE (SILVADENE) 1 % cream Apply daily as directed Qty: 100 g, Refills: 2    theophylline (THEO-24) 400 MG 24 hr capsule Take 1 capsule (400 mg total) by mouth daily. Qty: 90 capsule, Refills: 2    torsemide (DEMADEX) 20 MG tablet Take 2 tablets (40 mg total) by mouth 2 (two) times daily. Qty: 360 tablet, Refills: 3   Associated Diagnoses: Chronic systolic heart failure    Vitamin D, Ergocalciferol, (DRISDOL) 50000 UNITS CAPS capsule TAKE 1 CAPSULE BY MOUTH EVERY 7 DAYS ON SUNDAY OR MONDAY Qty: 4 capsule, Refills: 0    clotrimazole-betamethasone (LOTRISONE) cream Apply 1 application topically 2 (two) times daily. To her feet Qty: 129 g, Refills: 1    potassium chloride (KLOR-CON) 20 MEQ packet Take 20 mEq by mouth 3 (three) times daily. Qty: 270 tablet, Refills: 1   Associated Diagnoses: Hypertension; Unspecified venous  (peripheral) insufficiency; Lymphedema      STOP taking these medications     ondansetron (ZOFRAN) 4 MG tablet        Allergies  Allergen Reactions  . Aspirin     REACTION: upset stomach  . Codeine     REACTION: nausea  . Latex     unknown   Follow-up Information    Please follow up.   Why:  MD at SNF       Follow up with Erskine Emery, MD. Call in 2 weeks.   Specialty:  Gastroenterology   Contact information:   [address] [place] [address] [phone]        The results of significant diagnostics from this hospitalization (including imaging, microbiology, ancillary and laboratory) are listed below for reference.    Significant Diagnostic Studies: Dg Chest 2 View  04/03/2015   CLINICAL DATA:  Severe swelling involving the left lower extremity with pain and redness.  EXAM: LEFT FOOT - COMPLETE 3+ VIEW; LEFT TIBIA AND FIBULA - 2 VIEW; CHEST - 2 VIEW  COMPARISON:  Chest x-ray 02/11/2015  FINDINGS: Chest x-ray:  The heart is enlarged but stable. There is  tortuosity and ectasia of the thoracic aorta. Chronic emphysematous and bronchitic lung changes but no definite acute pulmonary findings.  Left tibia/ fibula:  Diffuse soft tissue swelling/ edema involving the lower aspect of the tibia and fibula. No destructive bony changes.  Left foot:  Severe diffuse soft tissue swelling. Advanced degenerative changes at the first metatarsal phalangeal joint. I do not see any obvious destructive bony changes to suggest osteomyelitis.  IMPRESSION: 1. No acute cardiopulmonary findings. 2. Severe diffuse soft tissue swelling involving the left lower extremity. No definite findings for osteomyelitis.   Electronically Signed   By: Marijo Sanes M.D.   On: 04/03/2015 18:57   Dg Tibia/fibula Left  04/03/2015   CLINICAL DATA:  Severe swelling involving the left lower extremity with pain and redness.  EXAM: LEFT FOOT - COMPLETE 3+ VIEW; LEFT TIBIA AND FIBULA - 2 VIEW; CHEST - 2 VIEW   COMPARISON:  Chest x-ray 02/11/2015  FINDINGS: Chest x-ray:  The heart is enlarged but stable. There is tortuosity and ectasia of the thoracic aorta. Chronic emphysematous and bronchitic lung changes but no definite acute pulmonary findings.  Left tibia/ fibula:  Diffuse soft tissue swelling/ edema involving the lower aspect of the tibia and fibula. No destructive bony changes.  Left foot:  Severe diffuse soft tissue swelling. Advanced degenerative changes at the first metatarsal phalangeal joint. I do not see any obvious destructive bony changes to suggest osteomyelitis.  IMPRESSION: 1. No acute cardiopulmonary findings. 2. Severe diffuse soft tissue swelling involving the left lower extremity. No definite findings for osteomyelitis.   Electronically Signed   By: Marijo Sanes M.D.   On: 04/03/2015 18:57   Dg Foot Complete Left  04/03/2015   CLINICAL DATA:  Severe swelling involving the left lower extremity with pain and redness.  EXAM: LEFT FOOT - COMPLETE 3+ VIEW; LEFT TIBIA AND FIBULA - 2 VIEW; CHEST - 2 VIEW  COMPARISON:  Chest x-ray 02/11/2015  FINDINGS: Chest x-ray:  The heart is enlarged but stable. There is tortuosity and ectasia of the thoracic aorta. Chronic emphysematous and bronchitic lung changes but no definite acute pulmonary findings.  Left tibia/ fibula:  Diffuse soft tissue swelling/ edema involving the lower aspect of the tibia and fibula. No destructive bony changes.  Left foot:  Severe diffuse soft tissue swelling. Advanced degenerative changes at the first metatarsal phalangeal joint. I do not see any obvious destructive bony changes to suggest osteomyelitis.  IMPRESSION: 1. No acute cardiopulmonary findings. 2. Severe diffuse soft tissue swelling involving the left lower extremity. No definite findings for osteomyelitis.   Electronically Signed   By: Marijo Sanes M.D.   On: 04/03/2015 18:57    Microbiology: Recent Results (from the past 240 hour(s))  Culture, blood (routine x 2)      Status: None (Preliminary result)   Collection Time: 04/03/15  5:32 PM  Result Value Ref Range Status   Specimen Description RIGHT ANTECUBITAL  Final   Special Requests BOTTLES DRAWN AEROBIC AND ANAEROBIC 5CC  Final   Culture   Final           BLOOD CULTURE RECEIVED NO GROWTH TO DATE CULTURE WILL BE HELD FOR 5 DAYS BEFORE ISSUING A FINAL NEGATIVE REPORT Performed at [facility]    Report Status PENDING  Incomplete  Culture, blood (routine x 2)     Status: None (Preliminary result)   Collection Time: 04/03/15  5:41 PM  Result Value Ref Range Status   Specimen Description LEFT ANTECUBITAL  Final   Special Requests BOTTLES DRAWN AEROBIC AND ANAEROBIC 5 CC  Final   Culture   Final           BLOOD CULTURE RECEIVED NO GROWTH TO DATE CULTURE WILL BE HELD FOR 5 DAYS BEFORE ISSUING A FINAL NEGATIVE REPORT Performed at [facility]    Report Status PENDING  Incomplete  Urine culture     Status: None   Collection Time: 04/03/15  6:30 PM  Result Value Ref Range Status   Specimen Description URINE, CATHETERIZED  Final   Special Requests Normal  Final   Colony Count NO GROWTH Performed at [facility]   Final   Culture NO GROWTH Performed at [facility]   Final   Report Status 04/04/2015 FINAL  Final     Labs: Basic Metabolic Panel:  Recent Labs Lab 04/03/15 1731 04/04/15 0634 04/05/15 0515  NA 147* 144 143  K 3.7 3.3* 3.2*  CL 120* 113* 107  CO2 20* 24 26  GLUCOSE 110* 227* 96  BUN 30* 27* 24*  CREATININE 1.28* 1.12* 1.05*  CALCIUM 9.0 8.7* 9.1   Liver Function Tests:  Recent Labs Lab 04/03/15 1731  AST 34  ALT 23  ALKPHOS 71  BILITOT 1.1  PROT 8.5*  ALBUMIN 2.5*   No results for input(s): LIPASE, AMYLASE in the last 168 hours. No results for input(s): AMMONIA in the last 168 hours. CBC:  Recent Labs Lab 04/03/15 1731 04/04/15 0634 04/05/15 0515  WBC 22.6*  --  10.4  NEUTROABS 20.7*  --   --   HGB 5.4* 9.3* 9.9*  HCT  17.1* 28.5* 30.1*  MCV 82.6  --  80.7  PLT 378  --  273   Cardiac Enzymes: No results for input(s): CKTOTAL, CKMB, CKMBINDEX, TROPONINI in the last 168 hours. BNP: BNP (last 3 results) No results for input(s): BNP in the last 8760 hours.  ProBNP (last 3 results)  Recent Labs  04/14/14 1622 02/11/15 1405  PROBNP 191.0* 262.0*    CBG: No results for input(s): GLUCAP in the last 168 hours.     SignedLouellen Molder  [facility] 04/06/2015, 11:15 AM

## 2015-04-06 NOTE — Progress Notes (Signed)
MEDICATION RELATED CONSULT NOTE - INITIAL   Pharmacy Consult for IV iron Indication: anemia  Allergies  Allergen Reactions  . Aspirin     REACTION: upset stomach  . Codeine     REACTION: nausea  . Latex     unknown    Patient Measurements: Height: 4' 11.5" (151.1 cm) Weight: 126 lb 8 oz (57.38 kg) IBW/kg (Calculated) : 44.35 Adjusted Body Weight:   Vital Signs: Temp: 98.6 F (37 C) (06/07 0446) Temp Source: Oral (06/07 0446) BP: 127/69 mmHg (06/07 0446) Pulse Rate: 65 (06/07 0446) Intake/Output from previous day:   Intake/Output from this shift:    Labs:  Recent Labs  04/03/15 1731 04/04/15 0634 04/05/15 0515  WBC 22.6*  --  10.4  HGB 5.4* 9.3* 9.9*  HCT 17.1* 28.5* 30.1*  PLT 378  --  273  CREATININE 1.28* 1.12* 1.05*  ALBUMIN 2.5*  --   --   PROT 8.5*  --   --   AST 34  --   --   ALT 23  --   --   ALKPHOS 71  --   --   BILITOT 1.1  --   --    Estimated Creatinine Clearance: 32.9 mL/min (by C-G formula based on Cr of 1.05).   Microbiology: Recent Results (from the past 720 hour(s))  Culture, blood (routine x 2)     Status: None (Preliminary result)   Collection Time: 04/03/15  5:32 PM  Result Value Ref Range Status   Specimen Description RIGHT ANTECUBITAL  Final   Special Requests BOTTLES DRAWN AEROBIC AND ANAEROBIC 5CC  Final   Culture   Final           BLOOD CULTURE RECEIVED NO GROWTH TO DATE CULTURE WILL BE HELD FOR 5 DAYS BEFORE ISSUING A FINAL NEGATIVE REPORT Performed at [facility]    Report Status PENDING  Incomplete  Culture, blood (routine x 2)     Status: None (Preliminary result)   Collection Time: 04/03/15  5:41 PM  Result Value Ref Range Status   Specimen Description LEFT ANTECUBITAL  Final   Special Requests BOTTLES DRAWN AEROBIC AND ANAEROBIC 5 CC  Final   Culture   Final           BLOOD CULTURE RECEIVED NO GROWTH TO DATE CULTURE WILL BE HELD FOR 5 DAYS BEFORE ISSUING A FINAL NEGATIVE REPORT Performed at [facility]    Report Status PENDING  Incomplete  Urine culture     Status: None   Collection Time: 04/03/15  6:30 PM  Result Value Ref Range Status   Specimen Description URINE, CATHETERIZED  Final   Special Requests Normal  Final   Colony Count NO GROWTH Performed at [facility]   Final   Culture NO GROWTH Performed at [facility]   Final   Report Status 04/04/2015 FINAL  Final    Medical History: Past Medical History  Diagnosis Date  . Obstructive chronic bronchitis without exacerbation   . Unspecified essential hypertension   . Unspecified venous (peripheral) insufficiency   . Edema 2005    "since put the glass in them"   . Other abnormal glucose   . Lumbago   . Unspecified nonpsychotic mental disorder   . Unspecified venous (peripheral) insufficiency   . Unspecified vitamin D deficiency   . Anemia, unspecified   . Anxiety   . Arthritis   . Asthma   . Systolic heart failure  EF 35 to 40% per echo January 2015  . Depression   . GERD (gastroesophageal reflux disease)     Assessment: 83 YOF found down in bathroom.  Noted to be anemic with Hgb = 5.4 at admission.  Hgb has improved following PRBC.  Pharmacy asked to dose IV iron (recommend by GI following unrevealing upper and lower endoscopy)  Labs:  6/5  total iron = 19 Ferritin = 540  Goal of Therapy:  Improve iron/Hgb  Plan:   Ferumoxytol (Feraheme) 510mg  IVPB x 1 dose  Suggest resuming oral iron supplementation   Doreene Eland, PharmD, BCPS.   Pager: [phone] 04/06/2015,9:57 AM

## 2015-04-06 NOTE — ED Provider Notes (Signed)
CSN: [id number]     Arrival date & time 04/06/15  2103 History   First MD Initiated Contact with Patient 04/06/15 2153     Chief Complaint  Patient presents with  . Fall  . Eye Injury     (Consider location/radiation/quality/duration/timing/severity/associated sxs/prior Treatment) Patient is a 81 y.o. female presenting with fall and eye injury. The history is provided by the patient.  Fall This is a new problem. Associated symptoms include chest pain. Pertinent negatives include no abdominal pain and no shortness of breath.  Eye Injury Associated symptoms include chest pain. Pertinent negatives include no abdominal pain and no shortness of breath.   patient was discharged to the nursing home today. She had a fall today without loss of consciousness. She hit her face. Complaining of only some pain in her left face and left chest. Has laceration to her eyebrow. She has some baseline dementia but is pleasant. No difficulty breathing. No numbness or weakness. She has some chronic edema and pain in her feet.  No past medical history on file. No past surgical history on file. No family history on file. History  Substance Use Topics  . Smoking status: Not on file  . Smokeless tobacco: Not on file  . Alcohol Use: Not on file   OB History    No data available     Review of Systems  Constitutional: Negative for fever.  Respiratory: Negative for shortness of breath.   Cardiovascular: Positive for chest pain.  Gastrointestinal: Negative for abdominal pain.  Genitourinary: Negative for flank pain.  Skin: Positive for wound.  Neurological: Negative for weakness.  Hematological: Bruises/bleeds easily.      Allergies  Aspirin; Codeine; and Latex  Home Medications   Prior to Admission medications   Medication Sig Start Date End Date Taking? Authorizing Provider  albuterol (PROVENTIL HFA;VENTOLIN HFA) 108 (90 BASE) MCG/ACT inhaler Inhale 2 puffs into the lungs every 4 (four) hours as  needed for wheezing or shortness of breath.   Yes Historical Provider, MD  ALPRAZolam Duanne Moron) 0.5 MG tablet Take 0.25-0.5 mg by mouth 3 (three) times daily as needed for anxiety.   Yes Historical Provider, MD  carvedilol (COREG) 3.125 MG tablet Take 3.125 mg by mouth 2 (two) times daily with a meal.   Yes Historical Provider, MD  Ferrous Sulfate Dried 200 (65 FE) MG TABS Take 1 tablet by mouth 2 (two) times daily.   Yes Historical Provider, MD  hydrOXYzine (ATARAX/VISTARIL) 25 MG tablet Take 25 mg by mouth every 4 (four) hours as needed for itching.   Yes Historical Provider, MD  lisinopril (PRINIVIL,ZESTRIL) 10 MG tablet Take 10 mg by mouth daily.   Yes Historical Provider, MD  silver sulfADIAZINE (SILVADENE) 1 % cream Apply 1 application topically daily. As directed   Yes Historical Provider, MD  torsemide (DEMADEX) 20 MG tablet Take 40 mg by mouth 2 (two) times daily.   Yes Historical Provider, MD  traMADol (ULTRAM) 50 MG tablet Take 50 mg by mouth 3 (three) times daily.   Yes Historical Provider, MD  Vitamin D, Ergocalciferol, (DRISDOL) 50000 UNITS CAPS capsule Take 50,000 Units by mouth every 7 (seven) days. On Sundays or Mondays   Yes Historical Provider, MD   BP 144/77 mmHg  Pulse 88  Temp(Src) 98.3 F (36.8 C) (Oral)  Resp 18  SpO2 96% Physical Exam  Constitutional: She appears well-developed.  HENT:  1.5 cm horizontal laceration left eyebrow. Bleeding controlled 1.5 cm well approximately a laceration to left  cheek. Some tenderness laceration eyebrow with some swelling. Eye movements intact  Eyes: EOM are normal.  Neck: Normal range of motion. Neck supple.  Cardiovascular: Normal rate and regular rhythm.   Pulmonary/Chest: She exhibits tenderness.  Some tenderness to left anterior superior chest wall. No crepitance or deformity.  Abdominal: Bowel sounds are normal.  Musculoskeletal: She exhibits edema.  Chronic lymphedema to left foot has less swelling to right foot.    ED  Course  Procedures (including critical care time) Labs Review Labs Reviewed - No data to display  Imaging Review Dg Ribs Unilateral W/chest Left  04/06/2015   CLINICAL DATA:  Left anterior lower rib pain after unwitnessed fall at home. Initial encounter.  EXAM: LEFT RIBS AND CHEST - 3+ VIEW  COMPARISON:  None.  FINDINGS: Left rib images are negative for displaced fracture. The upright AP view of the chest is negative for pneumothorax. Mediastinal contours are normal. There is mild cardiomegaly. There is no large effusion. The lungs are clear.  IMPRESSION: No acute findings.  Mild cardiomegaly.   Electronically Signed   By: Andreas Newport M.D.   On: 04/06/2015 22:26   Ct Head Wo Contrast  04/06/2015   CLINICAL DATA:  Fall today.  Facial swelling.  Hematoma.  EXAM: CT HEAD WITHOUT CONTRAST  CT MAXILLOFACIAL WITHOUT CONTRAST  TECHNIQUE: Multidetector CT imaging of the head and maxillofacial structures were performed using the standard protocol without intravenous contrast. Multiplanar CT image reconstructions of the maxillofacial structures were also generated.  COMPARISON:  None.  FINDINGS: CT HEAD FINDINGS  No evidence for acute infarction, hemorrhage, mass lesion, hydrocephalus, or extra-axial fluid. Mild cerebral and cerebellar atrophy. Hypoattenuation white matter, likely small vessel disease.  LEFT frontal supraorbital scalp hematoma. No skull fracture. No mastoid fluid. Vascular calcification.  CT MAXILLOFACIAL FINDINGS  No facial fracture.  No sinus air-fluid level.  No blowout injury.  LEFT lateral preseptal periorbital and supraorbital hematoma. No injury to the globe. No postseptal hematoma on the LEFT. TMJs located. Slight dependent fluid in the smaller LEFT division sphenoid, appears inflammatory rather than posttraumatic.  IMPRESSION: LEFT facial hematoma. LEFT scalp hematoma. No facial bone fracture. No skull fracture or intracranial hemorrhage. No blowout injury. No postseptal injury to the  LEFT orbit, or globe.   Electronically Signed   By: Rolla Flatten M.D.   On: 04/06/2015 22:49   Ct Maxillofacial Wo Cm  04/06/2015   CLINICAL DATA:  Fall today.  Facial swelling.  Hematoma.  EXAM: CT HEAD WITHOUT CONTRAST  CT MAXILLOFACIAL WITHOUT CONTRAST  TECHNIQUE: Multidetector CT imaging of the head and maxillofacial structures were performed using the standard protocol without intravenous contrast. Multiplanar CT image reconstructions of the maxillofacial structures were also generated.  COMPARISON:  None.  FINDINGS: CT HEAD FINDINGS  No evidence for acute infarction, hemorrhage, mass lesion, hydrocephalus, or extra-axial fluid. Mild cerebral and cerebellar atrophy. Hypoattenuation white matter, likely small vessel disease.  LEFT frontal supraorbital scalp hematoma. No skull fracture. No mastoid fluid. Vascular calcification.  CT MAXILLOFACIAL FINDINGS  No facial fracture.  No sinus air-fluid level.  No blowout injury.  LEFT lateral preseptal periorbital and supraorbital hematoma. No injury to the globe. No postseptal hematoma on the LEFT. TMJs located. Slight dependent fluid in the smaller LEFT division sphenoid, appears inflammatory rather than posttraumatic.  IMPRESSION: LEFT facial hematoma. LEFT scalp hematoma. No facial bone fracture. No skull fracture or intracranial hemorrhage. No blowout injury. No postseptal injury to the LEFT orbit, or globe.   Electronically  Signed   By: Rolla Flatten M.D.   On: 04/06/2015 22:49     EKG Interpretation None      MDM   Final diagnoses:  Fall, initial encounter  Facial laceration, initial encounter  Facial contusion, initial encounter    Patient with fall. Facial lacerations closed. Superior one sutured and left cheek 1 occluded. Will discharge home. Chest tenderness but negative x-ray for rib fractures.   LACERATION REPAIR Performed by: Mackie Pai Authorized by: Mackie Pai Consent: Verbal consent obtained. Risks and benefits:  risks, benefits and alternatives were discussed Consent given by: patient Patient identity confirmed: provided demographic data Prepped and Draped in normal sterile fashion Wound explored  Laceration Location: Left eyebrow   Laceration Length: 1.5 cm  No Foreign Bodies seen or palpated  Anesthesia: local infiltration  Local anesthetic: lidocaine 1 % without epinephrine  Anesthetic total: 2 ml  Irrigation method: Scrub  Amount of cleaning: standard  Skin closure: 5-0 Vicryls Rapide   Number of sutures: 3   Technique: Simple interrupted   Patient tolerance: Patient tolerated the procedure well with no immediate complications.     Davonna Belling, MD 04/06/15 [phone]

## 2015-04-06 NOTE — Clinical Social Work Placement (Signed)
   CLINICAL SOCIAL WORK PLACEMENT  NOTE  Date:  04/06/2015  Patient Details  Name: LUCILIA YANNI MRN: [id number] Date of Birth: May 10, 1932  Clinical Social Work is seeking post-discharge placement for this patient at the [place] level of care (*CSW will initial, date and re-position this form in  chart as items are completed):  Yes   Patient/family provided with [place] Work Department's list of facilities offering this level of care within the geographic area requested by the patient (or if unable, by the patient's family).  Yes   Patient/family informed of their freedom to choose among providers that offer the needed level of care, that participate in Medicare, Medicaid or managed care program needed by the patient, have an available bed and are willing to accept the patient.  Yes   Patient/family informed of [facility]'s ownership interest in [facility] and [facility], as well as of the fact that they are under no obligation to receive care at these facilities.  PASRR submitted to EDS on 04/05/15     PASRR number received on 04/05/15     Existing PASRR number confirmed on       FL2 transmitted to all facilities in geographic area requested by pt/family on 04/05/15     FL2 transmitted to all facilities within larger geographic area on       Patient informed that his/her managed care company has contracts with or will negotiate with certain facilities, including the following:        Yes   Patient/family informed of bed offers received.  Patient chooses bed at  [facility])     Physician recommends and patient chooses bed at      Patient to be transferred to   on  .  Patient to be transferred to facility by       Patient family notified on   of transfer.  Name of family member notified:        PHYSICIAN Please prepare priority discharge summary, including medications     Additional Comment:     _______________________________________________ Ludwig Clarks, LCSW 04/06/2015, 11:05 AM

## 2015-04-06 NOTE — Progress Notes (Signed)
SNF bed offers provided and family has selected bed at [facility]- per Lattie Haw- niece, "the land that [facility] is built on was her land".  MD advised and will await final dc orders and summary for transfer today.     Eduard Clos, MSW, [place]

## 2015-04-06 NOTE — Progress Notes (Addendum)
Report called to Wells Guiles at [facility]. Patient medicated with Xanax and Vicodin prior to transport. Niece, Lattie Haw, to transport patient to facility. Paperwork sent with patient in a sealed folder.   Patient reports that she came in with a burgundy BB&T pouch in the pocket of her housecoat. It fell out in the ambulance and the EMT handed it back to her. She then remembers staff in the ED placing it over "by the cabinets in that hallway with the glass walls." Patient reported that she was transported to xray and then back to the ED. One time she mentioned that she came up to [place] with it and then she said that she did not. Family was at the bedside while the patient was in the ED and reported that they did not see it come up to [place] with her. ED secretary searched the department and checked with security without any luck of finding the pouch. ED has the patient's phone number to call her should it appear. Patient reports that the pouch contained her house key, ID, Credit cards, and $500 (5- $100 bills). Micheline Chapman, AD notified and followed up with patient.

## 2015-04-06 NOTE — Discharge Instructions (Signed)
Facial Laceration  A facial laceration is a cut on the face. These injuries can be painful and cause bleeding. Lacerations usually heal quickly, but they need special care to reduce scarring. DIAGNOSIS  Your health care provider will take a medical history, ask for details about how the injury occurred, and examine the wound to determine how deep the cut is. TREATMENT  Some facial lacerations may not require closure. Others may not be able to be closed because of an increased risk of infection. The risk of infection and the chance for successful closure will depend on various factors, including the amount of time since the injury occurred. The wound may be cleaned to help prevent infection. If closure is appropriate, pain medicines may be given if needed. Your health care provider will use stitches (sutures), wound glue (adhesive), or skin adhesive strips to repair the laceration. These tools bring the skin edges together to allow for faster healing and a better cosmetic outcome. If needed, you may also be given a tetanus shot. HOME CARE INSTRUCTIONS  Only take over-the-counter or prescription medicines as directed by your health care provider.  Follow your health care provider's instructions for wound care. These instructions will vary depending on the technique used for closing the wound. For Sutures:  Keep the wound clean and dry.   If you were given a bandage (dressing), you should change it at least once a day. Also change the dressing if it becomes wet or dirty, or as directed by your health care provider.   Wash the wound with soap and water 2 times a day. Rinse the wound off with water to remove all soap. Pat the wound dry with a clean towel.   After cleaning, apply a thin layer of the antibiotic ointment recommended by your health care provider. This will help prevent infection and keep the dressing from sticking.   You may shower as usual after the first 24 hours. Do not soak the  wound in water until the sutures are removed.   Get your sutures removed as directed by your health care provider. With facial lacerations, sutures should usually be taken out after 4-5 days to avoid stitch marks.   Wait a few days after your sutures are removed before applying any makeup. For Skin Adhesive Strips:  Keep the wound clean and dry.   Do not get the skin adhesive strips wet. You may bathe carefully, using caution to keep the wound dry.   If the wound gets wet, pat it dry with a clean towel.   Skin adhesive strips will fall off on their own. You may trim the strips as the wound heals. Do not remove skin adhesive strips that are still stuck to the wound. They will fall off in time.  For Wound Adhesive:  You may briefly wet your wound in the shower or bath. Do not soak or scrub the wound. Do not swim. Avoid periods of heavy sweating until the skin adhesive has fallen off on its own. After showering or bathing, gently pat the wound dry with a clean towel.   Do not apply liquid medicine, cream medicine, ointment medicine, or makeup to your wound while the skin adhesive is in place. This may loosen the film before your wound is healed.   If a dressing is placed over the wound, be careful not to apply tape directly over the skin adhesive. This may cause the adhesive to be pulled off before the wound is healed.   Avoid   prolonged exposure to sunlight or tanning lamps while the skin adhesive is in place.  The skin adhesive will usually remain in place for 5-10 days, then naturally fall off the skin. Do not pick at the adhesive film.  After Healing: Once the wound has healed, cover the wound with sunscreen during the day for 1 full year. This can help minimize scarring. Exposure to ultraviolet light in the first year will darken the scar. It can take 1-2 years for the scar to lose its redness and to heal completely.  SEEK IMMEDIATE MEDICAL CARE IF:  You have redness, pain, or  swelling around the wound.   You see ayellowish-white fluid (pus) coming from the wound.   You have chills or a fever.  MAKE SURE YOU:  Understand these instructions.  Will watch your condition.  Will get help right away if you are not doing well or get worse. Document Released: 11/23/2004 Document Revised: 08/06/2013 Document Reviewed: 05/29/2013 ExitCare Patient Information 2015 [employer]. This information is not intended to replace advice given to you by your health care provider. Make sure you discuss any questions you have with your health care provider.  

## 2015-04-06 NOTE — Clinical Social Work Placement (Signed)
   CLINICAL SOCIAL WORK PLACEMENT  NOTE  Date:  04/06/2015  Patient Details  Name: [name] MRN: [id number] Date of Birth: 09-29-1932  Clinical Social Work is seeking post-discharge placement for this patient at the [place] level of care (*CSW will initial, date and re-position this form in  chart as items are completed):  Yes   Patient/family provided with [place] Work Department's list of facilities offering this level of care within the geographic area requested by the patient (or if unable, by the patient's family).  Yes   Patient/family informed of their freedom to choose among providers that offer the needed level of care, that participate in Medicare, Medicaid or managed care program needed by the patient, have an available bed and are willing to accept the patient.  Yes   Patient/family informed of [facility]'s ownership interest in [facility] and [facility], as well as of the fact that they are under no obligation to receive care at these facilities.  PASRR submitted to EDS on 04/05/15     PASRR number received on 04/05/15     Existing PASRR number confirmed on       FL2 transmitted to all facilities in geographic area requested by pt/family on 04/05/15     FL2 transmitted to all facilities within larger geographic area on       Patient informed that his/her managed care company has contracts with or will negotiate with certain facilities, including the following:        Yes   Patient/family informed of bed offers received.  Patient chooses bed at  [facility])     Physician recommends and patient chooses bed at      Patient to be transferred to   on 04/06/15.  Patient to be transferred to facility by  (family car)     Patient family notified on  (daughter- Lattie Haw) of transfer.  Name of family member notified:        PHYSICIAN Please prepare priority discharge summary, including medications     Additional  Comment:    _______________________________________________ Ludwig Clarks, LCSW 04/06/2015, 2:00 PM

## 2015-04-06 NOTE — ED Notes (Signed)
[facility]AR CALLED TO PICK UP PT AND transport to [facility]

## 2015-04-06 NOTE — ED Notes (Signed)
Bed: [facility] Expected date:  Expected time:  Means of arrival:  Comments: EMS 36F fall from SNF

## 2015-04-06 NOTE — ED Notes (Signed)
Pt had been released from hospital and at facility only 4 hours and family found on floor with left eye injury,  Pt was here and admitted this weekend, given multiple units of blood.

## 2015-04-06 NOTE — Progress Notes (Signed)
Patient confirms her pcp is Dr. Lenna Gilford.  System updated.

## 2015-04-06 NOTE — ED Notes (Signed)
Pt returned from xray

## 2015-04-07 LAB — TYPE AND SCREEN
ABO/RH(D): B POS
Antibody Screen: NEGATIVE
UNIT DIVISION: 0
UNIT DIVISION: 0
UNIT DIVISION: 0
Unit division: 0

## 2015-04-08 ENCOUNTER — Encounter: Payer: Self-pay | Admitting: Gastroenterology

## 2015-04-09 DIAGNOSIS — N183 Chronic kidney disease, stage 3 (moderate): Secondary | ICD-10-CM | POA: Diagnosis not present

## 2015-04-09 DIAGNOSIS — I10 Essential (primary) hypertension: Secondary | ICD-10-CM | POA: Diagnosis not present

## 2015-04-09 DIAGNOSIS — I5022 Chronic systolic (congestive) heart failure: Secondary | ICD-10-CM | POA: Diagnosis not present

## 2015-04-09 DIAGNOSIS — F22 Delusional disorders: Secondary | ICD-10-CM | POA: Diagnosis not present

## 2015-04-09 DIAGNOSIS — I890 Lymphedema, not elsewhere classified: Secondary | ICD-10-CM | POA: Diagnosis not present

## 2015-04-09 DIAGNOSIS — L03116 Cellulitis of left lower limb: Secondary | ICD-10-CM | POA: Diagnosis not present

## 2015-04-09 DIAGNOSIS — D649 Anemia, unspecified: Secondary | ICD-10-CM | POA: Diagnosis not present

## 2015-04-09 LAB — CULTURE, BLOOD (ROUTINE X 2): Culture: NO GROWTH

## 2015-04-10 LAB — CULTURE, BLOOD (ROUTINE X 2): CULTURE: NO GROWTH

## 2015-04-12 DIAGNOSIS — T148 Other injury of unspecified body region: Secondary | ICD-10-CM | POA: Diagnosis not present

## 2015-04-12 DIAGNOSIS — S0083XA Contusion of other part of head, initial encounter: Secondary | ICD-10-CM | POA: Diagnosis not present

## 2015-04-19 ENCOUNTER — Ambulatory Visit (INDEPENDENT_AMBULATORY_CARE_PROVIDER_SITE_OTHER): Payer: Medicare Other | Admitting: Gastroenterology

## 2015-04-19 ENCOUNTER — Other Ambulatory Visit (INDEPENDENT_AMBULATORY_CARE_PROVIDER_SITE_OTHER): Payer: Medicare Other

## 2015-04-19 ENCOUNTER — Encounter: Payer: Self-pay | Admitting: Gastroenterology

## 2015-04-19 VITALS — BP 130/70 | HR 66 | Ht 59.5 in

## 2015-04-19 DIAGNOSIS — R112 Nausea with vomiting, unspecified: Secondary | ICD-10-CM

## 2015-04-19 DIAGNOSIS — D649 Anemia, unspecified: Secondary | ICD-10-CM

## 2015-04-19 DIAGNOSIS — D509 Iron deficiency anemia, unspecified: Secondary | ICD-10-CM

## 2015-04-19 DIAGNOSIS — D126 Benign neoplasm of colon, unspecified: Secondary | ICD-10-CM

## 2015-04-19 LAB — CBC WITH DIFFERENTIAL/PLATELET
BASOS ABS: 0 10*3/uL (ref 0.0–0.1)
Basophils Relative: 0.5 % (ref 0.0–3.0)
EOS ABS: 0 10*3/uL (ref 0.0–0.7)
Eosinophils Relative: 0.7 % (ref 0.0–5.0)
HCT: 35.3 % — ABNORMAL LOW (ref 36.0–46.0)
Hemoglobin: 11.5 g/dL — ABNORMAL LOW (ref 12.0–15.0)
LYMPHS PCT: 18.8 % (ref 12.0–46.0)
Lymphs Abs: 0.8 10*3/uL (ref 0.7–4.0)
MCHC: 32.5 g/dL (ref 30.0–36.0)
MCV: 84.3 fl (ref 78.0–100.0)
Monocytes Absolute: 0.4 10*3/uL (ref 0.1–1.0)
Monocytes Relative: 10 % (ref 3.0–12.0)
NEUTROS ABS: 3.1 10*3/uL (ref 1.4–7.7)
Neutrophils Relative %: 70 % (ref 43.0–77.0)
Platelets: 370 10*3/uL (ref 150.0–400.0)
RBC: 4.2 Mil/uL (ref 3.87–5.11)
RDW: 17.5 % — AB (ref 11.5–15.5)
WBC: 4.4 10*3/uL (ref 4.0–10.5)

## 2015-04-19 NOTE — Assessment & Plan Note (Signed)
Etiology for anemia is probably multifactorial.  There was no evidence for overt GI bleeding or microscopic bleeding.  Iron studies are equivocal.    Recommendations #1 repeat CBC

## 2015-04-19 NOTE — Assessment & Plan Note (Addendum)
Nausea is actually fairly well-controlled and seems to be a minor problem at present

## 2015-04-19 NOTE — Assessment & Plan Note (Signed)
May consider colonoscopy in 2 years pending overall medical status of the patient

## 2015-04-19 NOTE — Patient Instructions (Signed)
Go to the basement for labs before you leave today.

## 2015-04-19 NOTE — Progress Notes (Signed)
      History of Present Illness:  [name] 's recently hospitalized for weakness and a severe anemia with a hemoglobin of 5.4.  Several Hemoccults were negative.  Upper endoscopy was unrevealing.  Sigmoidoscopy demonstrated a small rectal polyp.  She has a history of MGUS and a large adenomatous polyp of the rectum that was resected 9 months ago.  He was transfused.  Since discharge she has done fairly well except for a fall requiring sutures for a facial laceration.  There is no  history of overt GI bleeding.  He has mild, intermittent nausea.    Review of Systems: Pertinent positive and negative review of systems were noted in the above HPI section. All other review of systems were otherwise negative.    Current Medications, Allergies, Past Medical History, Past Surgical History, Family History and Social History were reviewed in [name] record  Vital signs were reviewed in today's medical record. Physical Exam: General: Elderly female examined while sitting in a wheelchair Skin: anicteric Head: Normocephalic and atraumatic Eyes:  sclerae anicteric, EOMI Ears: Normal auditory acuity Mouth: No deformity or lesions Lymph Nodes: no lymphadenopathy Lungs: Clear throughout to auscultation Heart: Regular rate and rhythm; no murmurs, rubs or brui: Gastroinestinal:  Soft, non tender and non distended. No masses, hepatosplenomegaly or hernias noted. Normal Bowel sounds Rectal:deferred Musculoskeletal: Symmetrical with no gross deformities  Pulses:  Normal pulses noted Extremities: She has 4+ lymphedema of the left lower extremity and 1+ of the right lower extremity  Neurological: Alert oriented x 4, grossly nonfocal Psychological:  Alert and cooperative. Normal mood and affect  See Assessment and Plan under Problem List

## 2015-04-28 DIAGNOSIS — B351 Tinea unguium: Secondary | ICD-10-CM | POA: Diagnosis not present

## 2015-04-28 DIAGNOSIS — M79672 Pain in left foot: Secondary | ICD-10-CM | POA: Diagnosis not present

## 2015-05-07 DIAGNOSIS — I890 Lymphedema, not elsewhere classified: Secondary | ICD-10-CM | POA: Diagnosis not present

## 2015-05-07 DIAGNOSIS — I5022 Chronic systolic (congestive) heart failure: Secondary | ICD-10-CM | POA: Diagnosis not present

## 2015-05-07 DIAGNOSIS — F22 Delusional disorders: Secondary | ICD-10-CM | POA: Diagnosis not present

## 2015-05-07 DIAGNOSIS — N183 Chronic kidney disease, stage 3 (moderate): Secondary | ICD-10-CM | POA: Diagnosis not present

## 2015-05-07 DIAGNOSIS — E46 Unspecified protein-calorie malnutrition: Secondary | ICD-10-CM | POA: Diagnosis not present

## 2015-05-07 DIAGNOSIS — F0391 Unspecified dementia with behavioral disturbance: Secondary | ICD-10-CM | POA: Diagnosis not present

## 2015-05-18 ENCOUNTER — Telehealth: Payer: Self-pay | Admitting: Pulmonary Disease

## 2015-05-18 DIAGNOSIS — I5022 Chronic systolic (congestive) heart failure: Secondary | ICD-10-CM | POA: Diagnosis not present

## 2015-05-18 DIAGNOSIS — M6281 Muscle weakness (generalized): Secondary | ICD-10-CM | POA: Diagnosis not present

## 2015-05-18 DIAGNOSIS — I890 Lymphedema, not elsewhere classified: Secondary | ICD-10-CM | POA: Diagnosis not present

## 2015-05-18 DIAGNOSIS — L97929 Non-pressure chronic ulcer of unspecified part of left lower leg with unspecified severity: Secondary | ICD-10-CM | POA: Diagnosis not present

## 2015-05-18 DIAGNOSIS — I129 Hypertensive chronic kidney disease with stage 1 through stage 4 chronic kidney disease, or unspecified chronic kidney disease: Secondary | ICD-10-CM | POA: Diagnosis not present

## 2015-05-18 DIAGNOSIS — I872 Venous insufficiency (chronic) (peripheral): Secondary | ICD-10-CM | POA: Diagnosis not present

## 2015-05-18 NOTE — Telephone Encounter (Signed)
Called spoke with [facility] nurse. She is requesting order for social worker and a Scientist, product/process development (d/t pt being delusional). [facility] has there own psych nurse and just needs VO for this. Also requesting order for wound care--pt has open wound on left ankle. Requesting to have order for Profilate dressing and calcium alginate silver cream. Please advise SN thanks

## 2015-05-18 NOTE — Telephone Encounter (Signed)
Called and LMTCB for [facility] nurse

## 2015-05-18 NOTE — Telephone Encounter (Signed)
Return call.Stanley A Dalton °

## 2015-05-19 NOTE — Telephone Encounter (Signed)
Message printed and placed on  SN cart for review  

## 2015-05-20 DIAGNOSIS — L97929 Non-pressure chronic ulcer of unspecified part of left lower leg with unspecified severity: Secondary | ICD-10-CM | POA: Diagnosis not present

## 2015-05-20 DIAGNOSIS — I129 Hypertensive chronic kidney disease with stage 1 through stage 4 chronic kidney disease, or unspecified chronic kidney disease: Secondary | ICD-10-CM | POA: Diagnosis not present

## 2015-05-20 DIAGNOSIS — I872 Venous insufficiency (chronic) (peripheral): Secondary | ICD-10-CM | POA: Diagnosis not present

## 2015-05-20 DIAGNOSIS — M6281 Muscle weakness (generalized): Secondary | ICD-10-CM | POA: Diagnosis not present

## 2015-05-20 DIAGNOSIS — I890 Lymphedema, not elsewhere classified: Secondary | ICD-10-CM | POA: Diagnosis not present

## 2015-05-20 DIAGNOSIS — I5022 Chronic systolic (congestive) heart failure: Secondary | ICD-10-CM | POA: Diagnosis not present

## 2015-05-20 NOTE — Telephone Encounter (Signed)
Per SN,   -Ok to give verbal order for pt to have Education officer, museum and Scientist, product/process development through [facility] to give verbal order for wound care. If wound is bad please refer to the [place] [facility]  Left voicemail for [facility] nurse, Hilda Blades, to call back to discuss the above rec   Waiting on return call

## 2015-05-21 ENCOUNTER — Telehealth: Payer: Self-pay | Admitting: Pulmonary Disease

## 2015-05-21 DIAGNOSIS — I872 Venous insufficiency (chronic) (peripheral): Secondary | ICD-10-CM | POA: Diagnosis not present

## 2015-05-21 DIAGNOSIS — L97929 Non-pressure chronic ulcer of unspecified part of left lower leg with unspecified severity: Secondary | ICD-10-CM | POA: Diagnosis not present

## 2015-05-21 DIAGNOSIS — I5022 Chronic systolic (congestive) heart failure: Secondary | ICD-10-CM | POA: Diagnosis not present

## 2015-05-21 DIAGNOSIS — I129 Hypertensive chronic kidney disease with stage 1 through stage 4 chronic kidney disease, or unspecified chronic kidney disease: Secondary | ICD-10-CM | POA: Diagnosis not present

## 2015-05-21 DIAGNOSIS — I890 Lymphedema, not elsewhere classified: Secondary | ICD-10-CM | POA: Diagnosis not present

## 2015-05-21 DIAGNOSIS — M6281 Muscle weakness (generalized): Secondary | ICD-10-CM | POA: Diagnosis not present

## 2015-05-21 NOTE — Telephone Encounter (Signed)
Called spoke with Hilda Blades. Gave VO for below. Nothing further needed

## 2015-05-21 NOTE — Telephone Encounter (Signed)
I called and gave VO to kimberly. Nothing further needed

## 2015-05-25 DIAGNOSIS — L97929 Non-pressure chronic ulcer of unspecified part of left lower leg with unspecified severity: Secondary | ICD-10-CM | POA: Diagnosis not present

## 2015-05-25 DIAGNOSIS — I872 Venous insufficiency (chronic) (peripheral): Secondary | ICD-10-CM | POA: Diagnosis not present

## 2015-05-25 DIAGNOSIS — I890 Lymphedema, not elsewhere classified: Secondary | ICD-10-CM | POA: Diagnosis not present

## 2015-05-25 DIAGNOSIS — I129 Hypertensive chronic kidney disease with stage 1 through stage 4 chronic kidney disease, or unspecified chronic kidney disease: Secondary | ICD-10-CM | POA: Diagnosis not present

## 2015-05-25 DIAGNOSIS — I5022 Chronic systolic (congestive) heart failure: Secondary | ICD-10-CM | POA: Diagnosis not present

## 2015-05-25 DIAGNOSIS — M6281 Muscle weakness (generalized): Secondary | ICD-10-CM | POA: Diagnosis not present

## 2015-05-27 ENCOUNTER — Telehealth: Payer: Self-pay | Admitting: Pulmonary Disease

## 2015-05-27 DIAGNOSIS — M6281 Muscle weakness (generalized): Secondary | ICD-10-CM | POA: Diagnosis not present

## 2015-05-27 DIAGNOSIS — I129 Hypertensive chronic kidney disease with stage 1 through stage 4 chronic kidney disease, or unspecified chronic kidney disease: Secondary | ICD-10-CM | POA: Diagnosis not present

## 2015-05-27 DIAGNOSIS — L97929 Non-pressure chronic ulcer of unspecified part of left lower leg with unspecified severity: Secondary | ICD-10-CM | POA: Diagnosis not present

## 2015-05-27 DIAGNOSIS — I890 Lymphedema, not elsewhere classified: Secondary | ICD-10-CM | POA: Diagnosis not present

## 2015-05-27 DIAGNOSIS — I5022 Chronic systolic (congestive) heart failure: Secondary | ICD-10-CM | POA: Diagnosis not present

## 2015-05-27 DIAGNOSIS — I872 Venous insufficiency (chronic) (peripheral): Secondary | ICD-10-CM | POA: Diagnosis not present

## 2015-05-27 NOTE — Telephone Encounter (Signed)
Per SN okay for this. Called made kimberly aware and gave VO

## 2015-05-27 NOTE — Telephone Encounter (Signed)
[name] called to see if Dr. Lenna Gilford will approve a Social Work consult to have Social worker Daniel in to evaluate her home situation and see what resources she is eligible for, to assist with different programs that can assist her with daily life in her home.  SN - please advise.

## 2015-05-28 DIAGNOSIS — M6281 Muscle weakness (generalized): Secondary | ICD-10-CM | POA: Diagnosis not present

## 2015-05-28 DIAGNOSIS — I890 Lymphedema, not elsewhere classified: Secondary | ICD-10-CM | POA: Diagnosis not present

## 2015-05-28 DIAGNOSIS — L97929 Non-pressure chronic ulcer of unspecified part of left lower leg with unspecified severity: Secondary | ICD-10-CM | POA: Diagnosis not present

## 2015-05-28 DIAGNOSIS — I5022 Chronic systolic (congestive) heart failure: Secondary | ICD-10-CM | POA: Diagnosis not present

## 2015-05-28 DIAGNOSIS — I872 Venous insufficiency (chronic) (peripheral): Secondary | ICD-10-CM | POA: Diagnosis not present

## 2015-05-28 DIAGNOSIS — I129 Hypertensive chronic kidney disease with stage 1 through stage 4 chronic kidney disease, or unspecified chronic kidney disease: Secondary | ICD-10-CM | POA: Diagnosis not present

## 2015-05-31 DIAGNOSIS — I890 Lymphedema, not elsewhere classified: Secondary | ICD-10-CM | POA: Diagnosis not present

## 2015-05-31 DIAGNOSIS — I5022 Chronic systolic (congestive) heart failure: Secondary | ICD-10-CM | POA: Diagnosis not present

## 2015-05-31 DIAGNOSIS — M6281 Muscle weakness (generalized): Secondary | ICD-10-CM | POA: Diagnosis not present

## 2015-05-31 DIAGNOSIS — I129 Hypertensive chronic kidney disease with stage 1 through stage 4 chronic kidney disease, or unspecified chronic kidney disease: Secondary | ICD-10-CM | POA: Diagnosis not present

## 2015-05-31 DIAGNOSIS — L97929 Non-pressure chronic ulcer of unspecified part of left lower leg with unspecified severity: Secondary | ICD-10-CM | POA: Diagnosis not present

## 2015-05-31 DIAGNOSIS — I872 Venous insufficiency (chronic) (peripheral): Secondary | ICD-10-CM | POA: Diagnosis not present

## 2015-06-02 DIAGNOSIS — I872 Venous insufficiency (chronic) (peripheral): Secondary | ICD-10-CM | POA: Diagnosis not present

## 2015-06-02 DIAGNOSIS — I5022 Chronic systolic (congestive) heart failure: Secondary | ICD-10-CM | POA: Diagnosis not present

## 2015-06-02 DIAGNOSIS — M6281 Muscle weakness (generalized): Secondary | ICD-10-CM | POA: Diagnosis not present

## 2015-06-02 DIAGNOSIS — I890 Lymphedema, not elsewhere classified: Secondary | ICD-10-CM | POA: Diagnosis not present

## 2015-06-02 DIAGNOSIS — L97929 Non-pressure chronic ulcer of unspecified part of left lower leg with unspecified severity: Secondary | ICD-10-CM | POA: Diagnosis not present

## 2015-06-02 DIAGNOSIS — I129 Hypertensive chronic kidney disease with stage 1 through stage 4 chronic kidney disease, or unspecified chronic kidney disease: Secondary | ICD-10-CM | POA: Diagnosis not present

## 2015-06-03 ENCOUNTER — Telehealth: Payer: Self-pay | Admitting: Pulmonary Disease

## 2015-06-03 DIAGNOSIS — I872 Venous insufficiency (chronic) (peripheral): Secondary | ICD-10-CM | POA: Diagnosis not present

## 2015-06-03 DIAGNOSIS — M6281 Muscle weakness (generalized): Secondary | ICD-10-CM | POA: Diagnosis not present

## 2015-06-03 DIAGNOSIS — I890 Lymphedema, not elsewhere classified: Secondary | ICD-10-CM | POA: Diagnosis not present

## 2015-06-03 DIAGNOSIS — I129 Hypertensive chronic kidney disease with stage 1 through stage 4 chronic kidney disease, or unspecified chronic kidney disease: Secondary | ICD-10-CM | POA: Diagnosis not present

## 2015-06-03 DIAGNOSIS — L97929 Non-pressure chronic ulcer of unspecified part of left lower leg with unspecified severity: Secondary | ICD-10-CM | POA: Diagnosis not present

## 2015-06-03 DIAGNOSIS — I5022 Chronic systolic (congestive) heart failure: Secondary | ICD-10-CM | POA: Diagnosis not present

## 2015-06-03 NOTE — Telephone Encounter (Signed)
Per SN. Thanks for the eval but best thing would be to refer pt to neuro for eval.  Called made Arbie Cookey aware and she will see what pt wants to do.  Will call back if referral is needed

## 2015-06-03 NOTE — Telephone Encounter (Signed)
Called spoke with Arbie Cookey w/ [facility].  She reports she did psych eval on pt and pt failed the mini mental status terribly. She reports pt is very paranoid and thinks her step son steals from her. Arbie Cookey feels this is related to dementia. She thinks pt needs to be on medication for ? Dementia. Please advise SN thanks

## 2015-06-04 DIAGNOSIS — I129 Hypertensive chronic kidney disease with stage 1 through stage 4 chronic kidney disease, or unspecified chronic kidney disease: Secondary | ICD-10-CM | POA: Diagnosis not present

## 2015-06-04 DIAGNOSIS — I5022 Chronic systolic (congestive) heart failure: Secondary | ICD-10-CM | POA: Diagnosis not present

## 2015-06-04 DIAGNOSIS — I890 Lymphedema, not elsewhere classified: Secondary | ICD-10-CM | POA: Diagnosis not present

## 2015-06-04 DIAGNOSIS — M6281 Muscle weakness (generalized): Secondary | ICD-10-CM | POA: Diagnosis not present

## 2015-06-04 DIAGNOSIS — I872 Venous insufficiency (chronic) (peripheral): Secondary | ICD-10-CM | POA: Diagnosis not present

## 2015-06-04 DIAGNOSIS — L97929 Non-pressure chronic ulcer of unspecified part of left lower leg with unspecified severity: Secondary | ICD-10-CM | POA: Diagnosis not present

## 2015-06-07 DIAGNOSIS — I872 Venous insufficiency (chronic) (peripheral): Secondary | ICD-10-CM | POA: Diagnosis not present

## 2015-06-07 DIAGNOSIS — L97929 Non-pressure chronic ulcer of unspecified part of left lower leg with unspecified severity: Secondary | ICD-10-CM | POA: Diagnosis not present

## 2015-06-07 DIAGNOSIS — M6281 Muscle weakness (generalized): Secondary | ICD-10-CM | POA: Diagnosis not present

## 2015-06-07 DIAGNOSIS — I890 Lymphedema, not elsewhere classified: Secondary | ICD-10-CM | POA: Diagnosis not present

## 2015-06-07 DIAGNOSIS — I129 Hypertensive chronic kidney disease with stage 1 through stage 4 chronic kidney disease, or unspecified chronic kidney disease: Secondary | ICD-10-CM | POA: Diagnosis not present

## 2015-06-07 DIAGNOSIS — I5022 Chronic systolic (congestive) heart failure: Secondary | ICD-10-CM | POA: Diagnosis not present

## 2015-06-08 DIAGNOSIS — I5022 Chronic systolic (congestive) heart failure: Secondary | ICD-10-CM | POA: Diagnosis not present

## 2015-06-08 DIAGNOSIS — L97929 Non-pressure chronic ulcer of unspecified part of left lower leg with unspecified severity: Secondary | ICD-10-CM | POA: Diagnosis not present

## 2015-06-08 DIAGNOSIS — I872 Venous insufficiency (chronic) (peripheral): Secondary | ICD-10-CM | POA: Diagnosis not present

## 2015-06-08 DIAGNOSIS — I129 Hypertensive chronic kidney disease with stage 1 through stage 4 chronic kidney disease, or unspecified chronic kidney disease: Secondary | ICD-10-CM | POA: Diagnosis not present

## 2015-06-08 DIAGNOSIS — I890 Lymphedema, not elsewhere classified: Secondary | ICD-10-CM | POA: Diagnosis not present

## 2015-06-08 DIAGNOSIS — M6281 Muscle weakness (generalized): Secondary | ICD-10-CM | POA: Diagnosis not present

## 2015-06-09 DIAGNOSIS — I890 Lymphedema, not elsewhere classified: Secondary | ICD-10-CM | POA: Diagnosis not present

## 2015-06-09 DIAGNOSIS — L97929 Non-pressure chronic ulcer of unspecified part of left lower leg with unspecified severity: Secondary | ICD-10-CM | POA: Diagnosis not present

## 2015-06-09 DIAGNOSIS — I872 Venous insufficiency (chronic) (peripheral): Secondary | ICD-10-CM | POA: Diagnosis not present

## 2015-06-09 DIAGNOSIS — I129 Hypertensive chronic kidney disease with stage 1 through stage 4 chronic kidney disease, or unspecified chronic kidney disease: Secondary | ICD-10-CM | POA: Diagnosis not present

## 2015-06-09 DIAGNOSIS — M6281 Muscle weakness (generalized): Secondary | ICD-10-CM | POA: Diagnosis not present

## 2015-06-09 DIAGNOSIS — I5022 Chronic systolic (congestive) heart failure: Secondary | ICD-10-CM | POA: Diagnosis not present

## 2015-06-10 DIAGNOSIS — L309 Dermatitis, unspecified: Secondary | ICD-10-CM | POA: Diagnosis not present

## 2015-06-11 DIAGNOSIS — I129 Hypertensive chronic kidney disease with stage 1 through stage 4 chronic kidney disease, or unspecified chronic kidney disease: Secondary | ICD-10-CM | POA: Diagnosis not present

## 2015-06-11 DIAGNOSIS — I890 Lymphedema, not elsewhere classified: Secondary | ICD-10-CM | POA: Diagnosis not present

## 2015-06-11 DIAGNOSIS — M6281 Muscle weakness (generalized): Secondary | ICD-10-CM | POA: Diagnosis not present

## 2015-06-11 DIAGNOSIS — L97929 Non-pressure chronic ulcer of unspecified part of left lower leg with unspecified severity: Secondary | ICD-10-CM | POA: Diagnosis not present

## 2015-06-11 DIAGNOSIS — I872 Venous insufficiency (chronic) (peripheral): Secondary | ICD-10-CM | POA: Diagnosis not present

## 2015-06-11 DIAGNOSIS — I5022 Chronic systolic (congestive) heart failure: Secondary | ICD-10-CM | POA: Diagnosis not present

## 2015-06-12 DIAGNOSIS — I890 Lymphedema, not elsewhere classified: Secondary | ICD-10-CM | POA: Diagnosis not present

## 2015-06-12 DIAGNOSIS — L97929 Non-pressure chronic ulcer of unspecified part of left lower leg with unspecified severity: Secondary | ICD-10-CM | POA: Diagnosis not present

## 2015-06-12 DIAGNOSIS — I872 Venous insufficiency (chronic) (peripheral): Secondary | ICD-10-CM | POA: Diagnosis not present

## 2015-06-12 DIAGNOSIS — I5022 Chronic systolic (congestive) heart failure: Secondary | ICD-10-CM | POA: Diagnosis not present

## 2015-06-12 DIAGNOSIS — I129 Hypertensive chronic kidney disease with stage 1 through stage 4 chronic kidney disease, or unspecified chronic kidney disease: Secondary | ICD-10-CM | POA: Diagnosis not present

## 2015-06-12 DIAGNOSIS — M6281 Muscle weakness (generalized): Secondary | ICD-10-CM | POA: Diagnosis not present

## 2015-06-13 NOTE — Progress Notes (Signed)
Cardiology Office Note   Date:  06/14/2015   ID:  [name], DOB Feb 24, 1932, MRN [id number]  PCP:  [name], MD  Cardiologist:  Sinclair Grooms, MD   Chief Complaint  Patient presents with  . Congestive Heart Failure      History of Present Illness: [name] is a 81 y.o. female who presents for venous insufficiency, COPD, hypertension, and chronic systolic heart failure with EF 35-40% (2015) with subsequent perfusion study with EF of 50%..   The patient denies dyspnea. She is maintained on low-dose chronic heart failure therapy then includes carvedilol, lisinopril, and torsemide. She has difficulty with chronic bilateral left greater than right lower extremity edema. There is significant lichenification of the skin. She has Ace wrap Salm both lower extremities. She denies dyspnea. There is no orthopnea. No episodes of chest pain or palpitation of been noted. She denies chills and fever.  Recent hospital stay for anemia after which she was discharged to a skilled nursing facility.  Past Medical History  Diagnosis Date  . Obstructive chronic bronchitis without exacerbation   . Unspecified essential hypertension   . Unspecified venous (peripheral) insufficiency   . Edema 2005    "since put the glass in them"   . Other abnormal glucose   . Lumbago   . Unspecified nonpsychotic mental disorder   . Unspecified venous (peripheral) insufficiency   . Unspecified vitamin D deficiency   . Anemia, unspecified   . Anxiety   . Arthritis   . Asthma   . Systolic heart failure     EF 35 to 40% per echo January 2015  . Depression   . GERD (gastroesophageal reflux disease)     Past Surgical History  Procedure Laterality Date  . Cholecystectomy    . Tonsillectomy    . Cervical spine surgery      x 2  . Partial hysterectomy    . Lumbar disc surgery    . Esophagogastroduodenoscopy N/A 04/05/2015    Procedure: ESOPHAGOGASTRODUODENOSCOPY (EGD);  Surgeon: [name], MD;  Location: [facility];  Service: Endoscopy;  Laterality: N/A;  . Flexible sigmoidoscopy N/A 04/05/2015    Procedure: FLEXIBLE SIGMOIDOSCOPY;  Surgeon: [name], MD;  Location: [facility];  Service: Endoscopy;  Laterality: N/A;     Current Outpatient Prescriptions  Medication Sig Dispense Refill  . albuterol (PROAIR HFA) 108 (90 BASE) MCG/ACT inhaler INHALE 2 PUFFS EVERY 4 HOURS AS NEEDED FOR WHEEZING 8.5 Inhaler 2  . ALPRAZolam (XANAX) 0.5 MG tablet TAKE 1/2 TO 1 TABLET BY MOUTH THREE TIMES DAILY AS NEEDED FOR NERVES 30 tablet 0  . carvedilol (COREG) 6.25 MG tablet TAKE 1 TABLET BY MOUTH TWICE DAILY 180 tablet 2  . clotrimazole-betamethasone (LOTRISONE) cream Apply 1 application topically 2 (two) times daily. To her feet 129 g 1  . Ferrous Sulfate Dried 200 (65 FE) MG TABS Take 1 tablet by mouth 2 (two) times daily. 30 tablet 2  . hydrOXYzine (ATARAX/VISTARIL) 25 MG tablet Take 1 tablet (25 mg total) by mouth every 4 (four) hours as needed. For itching 100 tablet 3  . lisinopril (PRINIVIL,ZESTRIL) 10 MG tablet Take 1 tablet (10 mg total) by mouth daily. 90 tablet 2  . lisinopril (PRINIVIL,ZESTRIL) 10 MG tablet Take 10 mg by mouth daily.    . potassium chloride (KLOR-CON) 20 MEQ packet Take 20 mEq by mouth 3 (three) times daily. 270 tablet 1  . silver sulfADIAZINE (SILVADENE) 1 % cream Apply  daily as directed 100 g 2  . theophylline (THEO-24) 400 MG 24 hr capsule Take 1 capsule (400 mg total) by mouth daily. 90 capsule 2  . torsemide (DEMADEX) 20 MG tablet Take 2 tablets (40 mg total) by mouth 2 (two) times daily. 360 tablet 3  . traMADol (ULTRAM) 50 MG tablet Take 1 tablet (50 mg total) by mouth 3 (three) times daily. 30 tablet 0  . triamcinolone cream (KENALOG) 0.1 % Apply 1 application topically 2 (two) times daily.  2  . Vitamin D, Ergocalciferol, (DRISDOL) 50000 UNITS CAPS capsule TAKE 1 CAPSULE BY MOUTH EVERY 7 DAYS ON SUNDAY OR MONDAY 4 capsule 0   No current  facility-administered medications for this visit.    Allergies:   Aspirin; Aspirin; Codeine; Codeine; and Latex    Social History:  The patient  reports that she has never smoked. She has never used smokeless tobacco. She reports that she does not drink alcohol or use illicit drugs.   Family History:  The patient's family history includes Cancer in her paternal aunt; Diabetes in her mother and paternal grandfather; Heart disease in her maternal aunt.    ROS:  Please see the history of present illness.   Otherwise, review of systems are positive for denies cardiopulmonary complaints.   All other systems are reviewed and negative.    PHYSICAL EXAM: VS:  BP 140/86 mmHg  Pulse 66  Ht 4\' 11"  (1.499 m)  Wt 62.143 kg (137 lb)  BMI 27.66 kg/m2 , BMI Body mass index is 27.66 kg/(m^2). GEN: Well nourished, well developed, in no acute distress HEENT: normal Neck: no JVD, carotid bruits, or masses Cardiac: RRR; no murmurs, rubs, or gallops,no edema  Respiratory:  clear to auscultation bilaterally, normal work of breathing GI: soft, nontender, nondistended, + BS MS: no deformity or atrophy Skin: warm and dry, no rash Neuro:  Strength and sensation are intact Psych: euthymic mood, full affect   EKG:  EKG is ordered today. The ekg ordered today demonstrates normal sinus rhythm, left axis deviation, QS pattern V1 and V2.   Recent Labs: 02/11/2015: Pro B Natriuretic peptide (BNP) 262.0* 04/03/2015: ALT 23 04/05/2015: BUN 24*; Creatinine, Ser 1.05*; Potassium 3.2*; Sodium 143 04/19/2015: Hemoglobin 11.5*; Platelets 370.0    Lipid Panel    Component Value Date/Time   CHOL 182 01/28/2010 1023   TRIG 75.0 01/28/2010 1023   HDL 57.90 01/28/2010 1023   CHOLHDL 3 01/28/2010 1023   VLDL 15.0 01/28/2010 1023   LDLCALC 109* 01/28/2010 1023      Wt Readings from Last 3 Encounters:  06/14/15 62.143 kg (137 lb)  04/04/15 57.38 kg (126 lb 8 oz)  02/11/15 62.234 kg (137 lb 3.2 oz)      Other  studies Reviewed: Additional studies/ records that were reviewed today include: Recent hospital stay and follow-up notes by various providers were reviewed.. Review of the above records demonstrates: Over the last 12 months is no significant evidence of heart failure or pulmonary congestion based upon no reviewed.   ASSESSMENT AND PLAN:  1. Chronic systolic heart failure The patient is on low-dose lisinopril and carvedilol therapy. We will up titrate carvedilol to 6.25 mg twice a day. She will have a follow-up appointment in 6-8 weeks.  2. Essential hypertension Elevated systolic blood pressure. Up titration of carvedilol will be helpful.  3. 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 Venous (peripheral) insufficiency Managed in the wound clinic    Current medicines are reviewed at length with  the patient today.  The patient does not have concerns regarding medicines.  The following changes have been made:  Increase carvedilol to 6.25 mg twice a day. This will help to further control blood pressure and give further protection against progression of LV dysfunction. At next visit we should consider scheduling an echo to be done perhaps 6 months from now.  Labs/ tests ordered today include:   Orders Placed This Encounter  Procedures  . EKG 12-Lead     Disposition:   FU with HS in 6 weeks  Signed, Sinclair Grooms, MD  06/14/2015 7:15 PM    [facility] [address], [place], [place]  [address] Phone: [phone]; Fax: [phone]

## 2015-06-14 ENCOUNTER — Encounter: Payer: Self-pay | Admitting: Interventional Cardiology

## 2015-06-14 ENCOUNTER — Ambulatory Visit (INDEPENDENT_AMBULATORY_CARE_PROVIDER_SITE_OTHER): Payer: Medicare Other | Admitting: Interventional Cardiology

## 2015-06-14 VITALS — BP 140/86 | HR 66 | Ht 59.0 in | Wt 137.0 lb

## 2015-06-14 DIAGNOSIS — I10 Essential (primary) hypertension: Secondary | ICD-10-CM

## 2015-06-14 DIAGNOSIS — I872 Venous insufficiency (chronic) (peripheral): Secondary | ICD-10-CM

## 2015-06-14 DIAGNOSIS — F22 Delusional disorders: Secondary | ICD-10-CM

## 2015-06-14 DIAGNOSIS — I5022 Chronic systolic (congestive) heart failure: Secondary | ICD-10-CM

## 2015-06-14 MED ORDER — CARVEDILOL 6.25 MG PO TABS: ORAL_TABLET | ORAL | Status: DC

## 2015-06-14 NOTE — Patient Instructions (Signed)
Medication Instructions:  Your physician has recommended you make the following change in your medication:  INCREASE Carvedilol to 6.25mg  Twice daily. An Rx has been sent to your pharmacy   Labwork: None   Testing/Procedures: None   Follow-Up: Your physician recommends that you schedule a follow-up appointment on 07/28/15 @ 1:45pm  Any Other Special Instructions Will Be Listed Below (If Applicable).

## 2015-06-15 DIAGNOSIS — I890 Lymphedema, not elsewhere classified: Secondary | ICD-10-CM | POA: Diagnosis not present

## 2015-06-15 DIAGNOSIS — I5022 Chronic systolic (congestive) heart failure: Secondary | ICD-10-CM | POA: Diagnosis not present

## 2015-06-15 DIAGNOSIS — I872 Venous insufficiency (chronic) (peripheral): Secondary | ICD-10-CM | POA: Diagnosis not present

## 2015-06-15 DIAGNOSIS — L97929 Non-pressure chronic ulcer of unspecified part of left lower leg with unspecified severity: Secondary | ICD-10-CM | POA: Diagnosis not present

## 2015-06-15 DIAGNOSIS — M6281 Muscle weakness (generalized): Secondary | ICD-10-CM | POA: Diagnosis not present

## 2015-06-15 DIAGNOSIS — I129 Hypertensive chronic kidney disease with stage 1 through stage 4 chronic kidney disease, or unspecified chronic kidney disease: Secondary | ICD-10-CM | POA: Diagnosis not present

## 2015-06-16 ENCOUNTER — Telehealth: Payer: Self-pay | Admitting: Pulmonary Disease

## 2015-06-16 DIAGNOSIS — I890 Lymphedema, not elsewhere classified: Secondary | ICD-10-CM | POA: Diagnosis not present

## 2015-06-16 DIAGNOSIS — I872 Venous insufficiency (chronic) (peripheral): Secondary | ICD-10-CM | POA: Diagnosis not present

## 2015-06-16 DIAGNOSIS — I5022 Chronic systolic (congestive) heart failure: Secondary | ICD-10-CM | POA: Diagnosis not present

## 2015-06-16 DIAGNOSIS — M6281 Muscle weakness (generalized): Secondary | ICD-10-CM | POA: Diagnosis not present

## 2015-06-16 DIAGNOSIS — L97929 Non-pressure chronic ulcer of unspecified part of left lower leg with unspecified severity: Secondary | ICD-10-CM | POA: Diagnosis not present

## 2015-06-16 DIAGNOSIS — I129 Hypertensive chronic kidney disease with stage 1 through stage 4 chronic kidney disease, or unspecified chronic kidney disease: Secondary | ICD-10-CM | POA: Diagnosis not present

## 2015-06-16 NOTE — Telephone Encounter (Signed)
Spoke with pt, states she is requesting a MRI.  Pt states she fell twice- has since been having headaches and pain in chest, shoulders, and kidneys.  Pt states she needs a "whole body checkup".  Pt fell twice back in June, was seen in ED after this fall, but is still in pain from these falls.  Pt requesting appt.  Scheduled next available with TP.  Nothing further needed at this time.

## 2015-06-16 NOTE — Telephone Encounter (Signed)
[name] returned [name]'s call.  He can be reached at [phone].

## 2015-06-16 NOTE — Telephone Encounter (Signed)
lmtcb X1 for [name]

## 2015-06-16 NOTE — Telephone Encounter (Signed)
lmtcb x2 for [employer]

## 2015-06-16 NOTE — Telephone Encounter (Signed)
Opened in error

## 2015-06-17 ENCOUNTER — Ambulatory Visit (INDEPENDENT_AMBULATORY_CARE_PROVIDER_SITE_OTHER): Payer: Medicare Other | Admitting: Adult Health

## 2015-06-17 ENCOUNTER — Other Ambulatory Visit (INDEPENDENT_AMBULATORY_CARE_PROVIDER_SITE_OTHER): Payer: Medicare Other

## 2015-06-17 VITALS — BP 118/74 | HR 63 | Temp 97.9°F | Ht <= 58 in | Wt 135.0 lb

## 2015-06-17 DIAGNOSIS — R51 Headache: Secondary | ICD-10-CM | POA: Diagnosis not present

## 2015-06-17 DIAGNOSIS — R519 Headache, unspecified: Secondary | ICD-10-CM

## 2015-06-17 DIAGNOSIS — R296 Repeated falls: Secondary | ICD-10-CM | POA: Diagnosis not present

## 2015-06-17 LAB — BASIC METABOLIC PANEL
BUN: 50 mg/dL — ABNORMAL HIGH (ref 6–23)
CHLORIDE: 102 meq/L (ref 96–112)
CO2: 29 meq/L (ref 19–32)
Calcium: 9.6 mg/dL (ref 8.4–10.5)
Creatinine, Ser: 1.37 mg/dL — ABNORMAL HIGH (ref 0.40–1.20)
GFR: 47.55 mL/min — ABNORMAL LOW (ref >60.00)
Glucose, Bld: 84 mg/dL (ref 70–99)
Potassium: 3.5 mEq/L (ref 3.5–5.1)
SODIUM: 137 meq/L (ref 135–145)

## 2015-06-17 NOTE — Patient Instructions (Signed)
We will set you up for a MRI brain  Warm heat to neck muscles. Tylenol As needed   Follow up Dr. Lenna Gilford  In 2 months and As needed   Please contact office for sooner follow up if symptoms do not improve or worsen or seek emergency care

## 2015-06-18 DIAGNOSIS — L97929 Non-pressure chronic ulcer of unspecified part of left lower leg with unspecified severity: Secondary | ICD-10-CM | POA: Diagnosis not present

## 2015-06-18 DIAGNOSIS — M6281 Muscle weakness (generalized): Secondary | ICD-10-CM | POA: Diagnosis not present

## 2015-06-18 DIAGNOSIS — I890 Lymphedema, not elsewhere classified: Secondary | ICD-10-CM | POA: Diagnosis not present

## 2015-06-18 DIAGNOSIS — I129 Hypertensive chronic kidney disease with stage 1 through stage 4 chronic kidney disease, or unspecified chronic kidney disease: Secondary | ICD-10-CM | POA: Diagnosis not present

## 2015-06-18 DIAGNOSIS — I872 Venous insufficiency (chronic) (peripheral): Secondary | ICD-10-CM | POA: Diagnosis not present

## 2015-06-18 DIAGNOSIS — I5022 Chronic systolic (congestive) heart failure: Secondary | ICD-10-CM | POA: Diagnosis not present

## 2015-06-21 ENCOUNTER — Ambulatory Visit (HOSPITAL_COMMUNITY)
Admission: RE | Admit: 2015-06-21 | Discharge: 2015-06-21 | Disposition: A | Discharge status: Home / Self Care | Payer: Medicare Other | Source: Ambulatory Visit | Attending: Adult Health | Admitting: Adult Health

## 2015-06-21 DIAGNOSIS — R531 Weakness: Secondary | ICD-10-CM | POA: Diagnosis not present

## 2015-06-21 DIAGNOSIS — M6281 Muscle weakness (generalized): Secondary | ICD-10-CM | POA: Diagnosis not present

## 2015-06-21 DIAGNOSIS — I5022 Chronic systolic (congestive) heart failure: Secondary | ICD-10-CM | POA: Diagnosis not present

## 2015-06-21 DIAGNOSIS — I872 Venous insufficiency (chronic) (peripheral): Secondary | ICD-10-CM | POA: Diagnosis not present

## 2015-06-21 DIAGNOSIS — R296 Repeated falls: Secondary | ICD-10-CM

## 2015-06-21 DIAGNOSIS — R51 Headache: Secondary | ICD-10-CM | POA: Diagnosis present

## 2015-06-21 DIAGNOSIS — I890 Lymphedema, not elsewhere classified: Secondary | ICD-10-CM | POA: Diagnosis not present

## 2015-06-21 DIAGNOSIS — L97929 Non-pressure chronic ulcer of unspecified part of left lower leg with unspecified severity: Secondary | ICD-10-CM | POA: Diagnosis not present

## 2015-06-21 DIAGNOSIS — S0990XA Unspecified injury of head, initial encounter: Secondary | ICD-10-CM | POA: Diagnosis not present

## 2015-06-21 DIAGNOSIS — I129 Hypertensive chronic kidney disease with stage 1 through stage 4 chronic kidney disease, or unspecified chronic kidney disease: Secondary | ICD-10-CM | POA: Diagnosis not present

## 2015-06-21 MED ORDER — GADOBENATE DIMEGLUMINE 529 MG/ML IV SOLN
10.0000 mL | Freq: Once | INTRAVENOUS | Status: AC
  Administered 2015-06-21: 10 mL via INTRAVENOUS

## 2015-06-21 NOTE — Progress Notes (Signed)
Quick Note:  Called and LM for pt to return call. ______

## 2015-06-21 NOTE — Progress Notes (Signed)
Quick Note:  Called and LM for pt to return call ______

## 2015-06-22 DIAGNOSIS — R519 Headache, unspecified: Secondary | ICD-10-CM | POA: Insufficient documentation

## 2015-06-22 DIAGNOSIS — M6281 Muscle weakness (generalized): Secondary | ICD-10-CM | POA: Diagnosis not present

## 2015-06-22 DIAGNOSIS — I5022 Chronic systolic (congestive) heart failure: Secondary | ICD-10-CM | POA: Diagnosis not present

## 2015-06-22 DIAGNOSIS — I890 Lymphedema, not elsewhere classified: Secondary | ICD-10-CM | POA: Diagnosis not present

## 2015-06-22 DIAGNOSIS — R51 Headache: Secondary | ICD-10-CM

## 2015-06-22 DIAGNOSIS — L97929 Non-pressure chronic ulcer of unspecified part of left lower leg with unspecified severity: Secondary | ICD-10-CM | POA: Diagnosis not present

## 2015-06-22 DIAGNOSIS — I129 Hypertensive chronic kidney disease with stage 1 through stage 4 chronic kidney disease, or unspecified chronic kidney disease: Secondary | ICD-10-CM | POA: Diagnosis not present

## 2015-06-22 DIAGNOSIS — I872 Venous insufficiency (chronic) (peripheral): Secondary | ICD-10-CM | POA: Diagnosis not present

## 2015-06-22 NOTE — Progress Notes (Signed)
Quick Note:  Called and spoke with pt. Reviewed results and recs. Pt voiced understanding and had no further questions. ______ 

## 2015-06-22 NOTE — Assessment & Plan Note (Signed)
Headache ? Tension  Will set up pt for MRI as she did have a fall with scalp hematoma to make sure no acute process or bleed present  Plan We will set you up for a MRI brain  Warm heat to neck muscles. Tylenol As needed   Follow up Dr. Lenna Gilford  In 2 months and As needed   Please contact office for sooner follow up if symptoms do not improve or worsen or seek emergency care

## 2015-06-22 NOTE — Progress Notes (Signed)
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 gain, vision loss, decreased hearing, hoarseness, chest pain, syncope, prolonged cough, headaches, hemoptysis, abdominal pain, melena, hematochezia, severe indigestion/heartburn, hematuria, incontinence, muscle weakness, suspicious skin lesions, transient blindness, depression, unusual weight change, abnormal bleeding, enlarged lymph nodes, and angioedema.     Objective:   Physical Exam     WD, Overweight, 81 y/o BF in NAD... GENERAL:  Alert & oriented x 3... she has obvious paranoid delusions...and rambling speech /ideas but very nice  HEENT:  [place]/AT, EOM-wnl, PERRLA, EACs-clear, TMs-wnl, NOSE-clear, THROAT-clear & wnl. NECK:  Supple w/ fairROM; no JVD; normal carotid impulses w/o bruits; no thyromegaly or nodules palpated; no lymphadenopathy. CHEST:  Clear to P & A; without wheezes/ rales/ or rhonchi heard.[name] HEART:  Regular Rhythm; without murmurs/ rubs/ or gallops detected... ABDOMEN:  Soft & nontender; normal bowel sounds; no organomegaly or masses palpated... EXT:  mod arthritic changes; +varicose veins/ +venous insuffic/ 2+edema in left foot w/ severe stasis dermatitis... NEURO:  CN's intact;  no focal neuro deficits... DERM:  chr  lymphedema & dermatitis in skin of LE's,     Assessment & Plan:

## 2015-06-23 ENCOUNTER — Telehealth: Payer: Self-pay | Admitting: Pulmonary Disease

## 2015-06-23 DIAGNOSIS — R519 Headache, unspecified: Secondary | ICD-10-CM

## 2015-06-23 DIAGNOSIS — N183 Chronic kidney disease, stage 3 unspecified: Secondary | ICD-10-CM

## 2015-06-23 DIAGNOSIS — E876 Hypokalemia: Secondary | ICD-10-CM

## 2015-06-23 DIAGNOSIS — I5022 Chronic systolic (congestive) heart failure: Secondary | ICD-10-CM

## 2015-06-23 DIAGNOSIS — R51 Headache: Secondary | ICD-10-CM

## 2015-06-23 NOTE — Telephone Encounter (Signed)
I called spoke with pt niece Dianna. She is wanting an order for home health nurse to come out an sit with patient during the day and night to help with pt needs. She reports [facility] accepts pt insurance. Please advise SN thanks

## 2015-06-24 DIAGNOSIS — I872 Venous insufficiency (chronic) (peripheral): Secondary | ICD-10-CM | POA: Diagnosis not present

## 2015-06-24 DIAGNOSIS — I890 Lymphedema, not elsewhere classified: Secondary | ICD-10-CM | POA: Diagnosis not present

## 2015-06-24 DIAGNOSIS — M6281 Muscle weakness (generalized): Secondary | ICD-10-CM | POA: Diagnosis not present

## 2015-06-24 DIAGNOSIS — I5022 Chronic systolic (congestive) heart failure: Secondary | ICD-10-CM | POA: Diagnosis not present

## 2015-06-24 DIAGNOSIS — I129 Hypertensive chronic kidney disease with stage 1 through stage 4 chronic kidney disease, or unspecified chronic kidney disease: Secondary | ICD-10-CM | POA: Diagnosis not present

## 2015-06-24 DIAGNOSIS — L97929 Non-pressure chronic ulcer of unspecified part of left lower leg with unspecified severity: Secondary | ICD-10-CM | POA: Diagnosis not present

## 2015-06-24 NOTE — Telephone Encounter (Signed)
Per SN, Ok to give verbal order for neuro

## 2015-06-24 NOTE — Telephone Encounter (Signed)
Neuro referral placed.  Dianna aware. PCCs, order was placed for [facility].  Dianna states pt already has a Therapist, sports, PT, OT, and Education officer, museum who visits with pt.  They are requesting services for someone to sit with pt during the day and stay with her qhs.  States [facility] advised they do not have this service available.  Do you know of a company that provides this and will accept pt's insurance?

## 2015-06-24 NOTE — Telephone Encounter (Signed)
Patient/home health nurse wanting to know if referral can be made for Neuro seeing that her headaches are not improving.  Order placed for home health already.  Please advise Dr Lenna Gilford. Thanks.   Notes Recorded by Melvenia Needles, NP on 06/21/2015 at 1:47 PM MRI is normal for age  Nothing to explain headaches  If not improving will need to follow up , can refer to neuro for evaluation  Please contact office for sooner follow up if symptoms do not improve or worsen or seek emergency care

## 2015-06-24 NOTE — Telephone Encounter (Signed)
Per SN, Ok to give a referral for home health services to help pt with ADLs, meals, etc.

## 2015-06-25 DIAGNOSIS — I5022 Chronic systolic (congestive) heart failure: Secondary | ICD-10-CM | POA: Diagnosis not present

## 2015-06-25 DIAGNOSIS — I129 Hypertensive chronic kidney disease with stage 1 through stage 4 chronic kidney disease, or unspecified chronic kidney disease: Secondary | ICD-10-CM | POA: Diagnosis not present

## 2015-06-25 DIAGNOSIS — L97929 Non-pressure chronic ulcer of unspecified part of left lower leg with unspecified severity: Secondary | ICD-10-CM | POA: Diagnosis not present

## 2015-06-25 DIAGNOSIS — I890 Lymphedema, not elsewhere classified: Secondary | ICD-10-CM | POA: Diagnosis not present

## 2015-06-25 DIAGNOSIS — I872 Venous insufficiency (chronic) (peripheral): Secondary | ICD-10-CM | POA: Diagnosis not present

## 2015-06-25 DIAGNOSIS — M6281 Muscle weakness (generalized): Secondary | ICD-10-CM | POA: Diagnosis not present

## 2015-06-25 NOTE — Telephone Encounter (Signed)
Order sent to [facility]

## 2015-06-28 DIAGNOSIS — I872 Venous insufficiency (chronic) (peripheral): Secondary | ICD-10-CM | POA: Diagnosis not present

## 2015-06-28 DIAGNOSIS — L97929 Non-pressure chronic ulcer of unspecified part of left lower leg with unspecified severity: Secondary | ICD-10-CM | POA: Diagnosis not present

## 2015-06-28 DIAGNOSIS — I890 Lymphedema, not elsewhere classified: Secondary | ICD-10-CM | POA: Diagnosis not present

## 2015-06-28 DIAGNOSIS — I129 Hypertensive chronic kidney disease with stage 1 through stage 4 chronic kidney disease, or unspecified chronic kidney disease: Secondary | ICD-10-CM | POA: Diagnosis not present

## 2015-06-28 DIAGNOSIS — I5022 Chronic systolic (congestive) heart failure: Secondary | ICD-10-CM | POA: Diagnosis not present

## 2015-06-28 DIAGNOSIS — M6281 Muscle weakness (generalized): Secondary | ICD-10-CM | POA: Diagnosis not present

## 2015-06-30 ENCOUNTER — Other Ambulatory Visit: Payer: Self-pay | Admitting: Pulmonary Disease

## 2015-07-02 DIAGNOSIS — I872 Venous insufficiency (chronic) (peripheral): Secondary | ICD-10-CM | POA: Diagnosis not present

## 2015-07-02 DIAGNOSIS — M6281 Muscle weakness (generalized): Secondary | ICD-10-CM | POA: Diagnosis not present

## 2015-07-02 DIAGNOSIS — I890 Lymphedema, not elsewhere classified: Secondary | ICD-10-CM | POA: Diagnosis not present

## 2015-07-02 DIAGNOSIS — I5022 Chronic systolic (congestive) heart failure: Secondary | ICD-10-CM | POA: Diagnosis not present

## 2015-07-02 DIAGNOSIS — I129 Hypertensive chronic kidney disease with stage 1 through stage 4 chronic kidney disease, or unspecified chronic kidney disease: Secondary | ICD-10-CM | POA: Diagnosis not present

## 2015-07-02 DIAGNOSIS — L97929 Non-pressure chronic ulcer of unspecified part of left lower leg with unspecified severity: Secondary | ICD-10-CM | POA: Diagnosis not present

## 2015-07-06 DIAGNOSIS — I5022 Chronic systolic (congestive) heart failure: Secondary | ICD-10-CM | POA: Diagnosis not present

## 2015-07-06 DIAGNOSIS — M6281 Muscle weakness (generalized): Secondary | ICD-10-CM | POA: Diagnosis not present

## 2015-07-06 DIAGNOSIS — I129 Hypertensive chronic kidney disease with stage 1 through stage 4 chronic kidney disease, or unspecified chronic kidney disease: Secondary | ICD-10-CM | POA: Diagnosis not present

## 2015-07-06 DIAGNOSIS — L97929 Non-pressure chronic ulcer of unspecified part of left lower leg with unspecified severity: Secondary | ICD-10-CM | POA: Diagnosis not present

## 2015-07-06 DIAGNOSIS — I872 Venous insufficiency (chronic) (peripheral): Secondary | ICD-10-CM | POA: Diagnosis not present

## 2015-07-06 DIAGNOSIS — I890 Lymphedema, not elsewhere classified: Secondary | ICD-10-CM | POA: Diagnosis not present

## 2015-07-08 DIAGNOSIS — I872 Venous insufficiency (chronic) (peripheral): Secondary | ICD-10-CM | POA: Diagnosis not present

## 2015-07-08 DIAGNOSIS — I5022 Chronic systolic (congestive) heart failure: Secondary | ICD-10-CM | POA: Diagnosis not present

## 2015-07-08 DIAGNOSIS — M6281 Muscle weakness (generalized): Secondary | ICD-10-CM | POA: Diagnosis not present

## 2015-07-08 DIAGNOSIS — L97929 Non-pressure chronic ulcer of unspecified part of left lower leg with unspecified severity: Secondary | ICD-10-CM | POA: Diagnosis not present

## 2015-07-08 DIAGNOSIS — I129 Hypertensive chronic kidney disease with stage 1 through stage 4 chronic kidney disease, or unspecified chronic kidney disease: Secondary | ICD-10-CM | POA: Diagnosis not present

## 2015-07-08 DIAGNOSIS — I890 Lymphedema, not elsewhere classified: Secondary | ICD-10-CM | POA: Diagnosis not present

## 2015-07-12 ENCOUNTER — Telehealth: Payer: Self-pay | Admitting: Pulmonary Disease

## 2015-07-12 NOTE — Telephone Encounter (Signed)
I gave VO to [name]. Nothing further needed

## 2015-07-12 NOTE — Telephone Encounter (Signed)
lmtcb x1 

## 2015-07-12 NOTE — Telephone Encounter (Signed)
Joelene Millin returned call and can be reached at [phone]

## 2015-07-14 DIAGNOSIS — I872 Venous insufficiency (chronic) (peripheral): Secondary | ICD-10-CM | POA: Diagnosis not present

## 2015-07-14 DIAGNOSIS — I5022 Chronic systolic (congestive) heart failure: Secondary | ICD-10-CM | POA: Diagnosis not present

## 2015-07-14 DIAGNOSIS — I129 Hypertensive chronic kidney disease with stage 1 through stage 4 chronic kidney disease, or unspecified chronic kidney disease: Secondary | ICD-10-CM | POA: Diagnosis not present

## 2015-07-14 DIAGNOSIS — M6281 Muscle weakness (generalized): Secondary | ICD-10-CM | POA: Diagnosis not present

## 2015-07-14 DIAGNOSIS — L97929 Non-pressure chronic ulcer of unspecified part of left lower leg with unspecified severity: Secondary | ICD-10-CM | POA: Diagnosis not present

## 2015-07-14 DIAGNOSIS — I890 Lymphedema, not elsewhere classified: Secondary | ICD-10-CM | POA: Diagnosis not present

## 2015-07-15 ENCOUNTER — Telehealth: Payer: Self-pay | Admitting: Pulmonary Disease

## 2015-07-15 DIAGNOSIS — I5022 Chronic systolic (congestive) heart failure: Secondary | ICD-10-CM | POA: Diagnosis not present

## 2015-07-15 DIAGNOSIS — L97929 Non-pressure chronic ulcer of unspecified part of left lower leg with unspecified severity: Secondary | ICD-10-CM | POA: Diagnosis not present

## 2015-07-15 DIAGNOSIS — I129 Hypertensive chronic kidney disease with stage 1 through stage 4 chronic kidney disease, or unspecified chronic kidney disease: Secondary | ICD-10-CM | POA: Diagnosis not present

## 2015-07-15 DIAGNOSIS — I890 Lymphedema, not elsewhere classified: Secondary | ICD-10-CM | POA: Diagnosis not present

## 2015-07-15 DIAGNOSIS — M6281 Muscle weakness (generalized): Secondary | ICD-10-CM | POA: Diagnosis not present

## 2015-07-15 DIAGNOSIS — I872 Venous insufficiency (chronic) (peripheral): Secondary | ICD-10-CM | POA: Diagnosis not present

## 2015-07-15 NOTE — Telephone Encounter (Signed)
Called and spoke to  [place]. [name] is requesting they extend their visits for another 7 weeks. [name] is doing BIL compression wraps on lower extremities and is going out once a week. Informed [name] that the last time the Torsemide was 03/2014. [name] verbalized understanding.   Dr. Lenna Gilford please advise if you are ok extending the nursing visits another 7 weeks. Thanks.

## 2015-07-16 MED ORDER — TORSEMIDE 20 MG PO TABS: 40.0000 mg | ORAL_TABLET | Freq: Two times a day (BID) | ORAL | Status: DC

## 2015-07-16 NOTE — Telephone Encounter (Signed)
Called and spoke with [name] from [place] her that SN gave approval for verbal order to continue therapy and nursing services. Also informed that refill would be sent to pharmacy  Nothing further is needed at this time

## 2015-07-16 NOTE — Telephone Encounter (Signed)
Angela returning call.Stanley A Dalton ° °

## 2015-07-16 NOTE — Telephone Encounter (Signed)
Per SN, Ok to give verbal order to extend therapy services Call in Toresmide 20mg  #120 with instructions to take 2 PO BID  Called Mar[facility]al Nurse and left message to call office back to discuss rec

## 2015-07-16 NOTE — Telephone Encounter (Signed)
Apolonio Schneiders, has this been done? Please advise.

## 2015-07-17 DIAGNOSIS — M545 Low back pain: Secondary | ICD-10-CM | POA: Diagnosis not present

## 2015-07-17 DIAGNOSIS — I5022 Chronic systolic (congestive) heart failure: Secondary | ICD-10-CM | POA: Diagnosis not present

## 2015-07-17 DIAGNOSIS — I890 Lymphedema, not elsewhere classified: Secondary | ICD-10-CM | POA: Diagnosis not present

## 2015-07-17 DIAGNOSIS — I129 Hypertensive chronic kidney disease with stage 1 through stage 4 chronic kidney disease, or unspecified chronic kidney disease: Secondary | ICD-10-CM | POA: Diagnosis not present

## 2015-07-17 DIAGNOSIS — I872 Venous insufficiency (chronic) (peripheral): Secondary | ICD-10-CM | POA: Diagnosis not present

## 2015-07-17 DIAGNOSIS — N183 Chronic kidney disease, stage 3 (moderate): Secondary | ICD-10-CM | POA: Diagnosis not present

## 2015-07-21 DIAGNOSIS — I890 Lymphedema, not elsewhere classified: Secondary | ICD-10-CM | POA: Diagnosis not present

## 2015-07-21 DIAGNOSIS — I872 Venous insufficiency (chronic) (peripheral): Secondary | ICD-10-CM | POA: Diagnosis not present

## 2015-07-21 DIAGNOSIS — I129 Hypertensive chronic kidney disease with stage 1 through stage 4 chronic kidney disease, or unspecified chronic kidney disease: Secondary | ICD-10-CM | POA: Diagnosis not present

## 2015-07-21 DIAGNOSIS — I5022 Chronic systolic (congestive) heart failure: Secondary | ICD-10-CM | POA: Diagnosis not present

## 2015-07-21 DIAGNOSIS — M545 Low back pain: Secondary | ICD-10-CM | POA: Diagnosis not present

## 2015-07-21 DIAGNOSIS — N183 Chronic kidney disease, stage 3 (moderate): Secondary | ICD-10-CM | POA: Diagnosis not present

## 2015-07-22 DIAGNOSIS — I129 Hypertensive chronic kidney disease with stage 1 through stage 4 chronic kidney disease, or unspecified chronic kidney disease: Secondary | ICD-10-CM | POA: Diagnosis not present

## 2015-07-22 DIAGNOSIS — M545 Low back pain: Secondary | ICD-10-CM | POA: Diagnosis not present

## 2015-07-22 DIAGNOSIS — I872 Venous insufficiency (chronic) (peripheral): Secondary | ICD-10-CM | POA: Diagnosis not present

## 2015-07-22 DIAGNOSIS — I5022 Chronic systolic (congestive) heart failure: Secondary | ICD-10-CM | POA: Diagnosis not present

## 2015-07-22 DIAGNOSIS — N183 Chronic kidney disease, stage 3 (moderate): Secondary | ICD-10-CM | POA: Diagnosis not present

## 2015-07-22 DIAGNOSIS — I890 Lymphedema, not elsewhere classified: Secondary | ICD-10-CM | POA: Diagnosis not present

## 2015-07-23 DIAGNOSIS — N183 Chronic kidney disease, stage 3 (moderate): Secondary | ICD-10-CM | POA: Diagnosis not present

## 2015-07-23 DIAGNOSIS — I872 Venous insufficiency (chronic) (peripheral): Secondary | ICD-10-CM | POA: Diagnosis not present

## 2015-07-23 DIAGNOSIS — I129 Hypertensive chronic kidney disease with stage 1 through stage 4 chronic kidney disease, or unspecified chronic kidney disease: Secondary | ICD-10-CM | POA: Diagnosis not present

## 2015-07-23 DIAGNOSIS — I5022 Chronic systolic (congestive) heart failure: Secondary | ICD-10-CM | POA: Diagnosis not present

## 2015-07-23 DIAGNOSIS — M545 Low back pain: Secondary | ICD-10-CM | POA: Diagnosis not present

## 2015-07-23 DIAGNOSIS — I890 Lymphedema, not elsewhere classified: Secondary | ICD-10-CM | POA: Diagnosis not present

## 2015-07-26 NOTE — Progress Notes (Signed)
Cardiology Office Note   Date:  07/28/2015   ID:  [name], DOB [birth year], MRN [id number]  PCP:  [name], MD  Cardiologist:  Sinclair Grooms, MD   Chief Complaint  Patient presents with  . Congestive Heart Failure      History of Present Illness: [name] is a 81 y.o. female who presents for presents for venous insufficiency, COPD, hypertension, and chronic systolic heart failure with EF 35-40% (2015) with subsequent perfusion study with EF of 50%..   Since last office visit in early September, she has gained weight and feels more short of breath. She has been compliant with her medical regimen. There is some orthopnea. There is some exertional dyspnea. Her weight is up 9 pounds.     Past Medical History  Diagnosis Date  . Obstructive chronic bronchitis without exacerbation   . Unspecified essential hypertension   . Unspecified venous (peripheral) insufficiency   . Edema 2005    "since put the glass in them"   . Other abnormal glucose   . Lumbago   . Unspecified nonpsychotic mental disorder   . Unspecified venous (peripheral) insufficiency   . Unspecified vitamin D deficiency   . Anemia, unspecified   . Anxiety   . Arthritis   . Asthma   . Systolic heart failure     EF 35 to 40% per echo January 2015  . Depression   . GERD (gastroesophageal reflux disease)     Past Surgical History  Procedure Laterality Date  . Cholecystectomy    . Tonsillectomy    . Cervical spine surgery      x 2  . Partial hysterectomy    . Lumbar disc surgery    . Esophagogastroduodenoscopy N/A 04/05/2015    Procedure: ESOPHAGOGASTRODUODENOSCOPY (EGD);  Surgeon: [name], MD;  Location: [facility];  Service: Endoscopy;  Laterality: N/A;  . Flexible sigmoidoscopy N/A 04/05/2015    Procedure: FLEXIBLE SIGMOIDOSCOPY;  Surgeon: [name], MD;  Location: [facility];  Service: Endoscopy;  Laterality: N/A;     Current Outpatient Prescriptions    Medication Sig Dispense Refill  . albuterol (PROAIR HFA) 108 (90 BASE) MCG/ACT inhaler INHALE 2 PUFFS EVERY 4 HOURS AS NEEDED FOR WHEEZING 8.5 Inhaler 2  . ALPRAZolam (XANAX) 0.5 MG tablet TAKE 1/2 TO 1 TABLET BY MOUTH THREE TIMES DAILY AS NEEDED FOR NERVES 30 tablet 0  . ascorbic acid (VITAMIN C) 500 MG tablet Take 500 mg by mouth daily.    . carvedilol (COREG) 6.25 MG tablet TAKE 1 TABLET BY MOUTH TWICE DAILY 180 tablet 2  . clotrimazole-betamethasone (LOTRISONE) cream Apply 1 application topically 2 (two) times daily. To her feet 129 g 1  . Ferrous Sulfate Dried 200 (65 FE) MG TABS Take 1 tablet by mouth 2 (two) times daily. 30 tablet 2  . hydrOXYzine (ATARAX/VISTARIL) 25 MG tablet Take 1 tablet (25 mg total) by mouth every 4 (four) hours as needed. For itching 100 tablet 3  . lisinopril (PRINIVIL,ZESTRIL) 10 MG tablet Take 1 tablet (10 mg total) by mouth daily. 90 tablet 2  . ondansetron (ZOFRAN) 4 MG tablet TAKE ONE TABLET BY MOUTH THREE TIMES DAILY AS NEEDED FOR NAUSEA 30 tablet 0  . potassium chloride (KLOR-CON) 20 MEQ packet Take 20 mEq by mouth 3 (three) times daily. 270 tablet 1  . silver sulfADIAZINE (SILVADENE) 1 % cream Apply daily as directed 100 g 2  . theophylline (THEO-24) 400 MG 24 hr capsule  Take 1 capsule (400 mg total) by mouth daily. 90 capsule 2  . torsemide (DEMADEX) 20 MG tablet Take 2 tablets (40 mg total) by mouth 2 (two) times daily. 360 tablet 3  . traMADol (ULTRAM) 50 MG tablet Take 1 tablet (50 mg total) by mouth 3 (three) times daily. 30 tablet 0  . triamcinolone cream (KENALOG) 0.1 % Apply 1 application topically 2 (two) times daily.  2  . Vitamin D, Ergocalciferol, (DRISDOL) 50000 UNITS CAPS capsule TAKE 1 CAPSULE BY MOUTH EVERY 7 DAYS ON SUNDAY OR MONDAY 4 capsule 0   No current facility-administered medications for this visit.    Allergies:   Aspirin; Codeine; and Latex    Social History:  The patient  reports that she has never smoked. She has never used  smokeless tobacco. She reports that she does not drink alcohol or use illicit drugs.   Family History:  The patient's family history includes Cancer in her paternal aunt; Diabetes in her mother and paternal grandfather; Heart disease in her maternal aunt.    ROS:  Please see the history of present illness.   Otherwise, review of systems are positive for weakness, difficulty sleeping, and cough. She also has had back discomfort, muscle pain, dizziness, facial swelling, constipation, nausea and vomiting, and difficulty with balance..   All other systems are reviewed and negative.    PHYSICAL EXAM: VS:  BP 142/80 mmHg  Pulse 74  Ht 4\' 11"  (1.499 m)  Wt 65.372 kg (144 lb 1.9 oz)  BMI 29.09 kg/m2  SpO2 98% , BMI Body mass index is 29.09 kg/(m^2). GEN: Well nourished, well developed, in no acute distress HEENT: normal Neck: no JVD, carotid bruits, or masses Cardiac: RRR.  There is no murmur, rub, or gallop. There is 3+ bilateral ankle to mid shin edema. Respiratory:  clear to auscultation bilaterally, normal work of breathing. GI: soft, nontender, nondistended, + BS MS: no deformity or atrophy Skin: warm and dry, no rash Neuro:  Strength and sensation are intact Psych: euthymic mood, full affect   EKG:  EKG is not ordered today.   Recent Labs: 02/11/2015: Pro B Natriuretic peptide (BNP) 262.0* 04/03/2015: ALT 23 04/19/2015: Hemoglobin 11.5*; Platelets 370.0 06/17/2015: BUN 50*; Creatinine, Ser 1.37*; Potassium 3.5; Sodium 137    Lipid Panel    Component Value Date/Time   CHOL 182 01/28/2010 1023   TRIG 75.0 01/28/2010 1023   HDL 57.90 01/28/2010 1023   CHOLHDL 3 01/28/2010 1023   VLDL 15.0 01/28/2010 1023   LDLCALC 109* 01/28/2010 1023      Wt Readings from Last 3 Encounters:  07/28/15 65.372 kg (144 lb 1.9 oz)  06/17/15 61.236 kg (135 lb)  06/14/15 62.143 kg (137 lb)      Other studies Reviewed: Additional studies/ records that were reviewed today include: The most  previous office note.. The findings include no more recent data..    ASSESSMENT AND PLAN:  1. Essential hypertension Slightly better controlled.  2. Chronic systolic heart failure Optimize heart fever therapy. Increase lisinopril to 20 mg daily and increased diuretic regimen slightly.  3. CKD (chronic kidney disease) stage 3, GFR 30-59 ml/min We'll need to be monitored closely    Current medicines are reviewed at length with the patient today.  The patient has the following concerns regarding medicines: None. She did bring forms from the [facility] that she will like to have [place] complete..  The following changes/actions have been instituted:    Increase torsemide to  60 mg twice daily.  Increase lisinopril to 20 mg per day  Basic metabolic panel in one week. Further instruction pending results of laboratory data  Office visit with Dr. [name] or extender in 4 weeks for further titration of heart failure therapy.  Labs/ tests ordered today include:  No orders of the defined types were placed in this encounter.     Disposition:   FU with HS in 1 month  Signed, Sinclair Grooms, MD  07/28/2015 2:09 PM    [address] [place], [place], [place]  [address] Phone: [phone]; Fax: [phone]

## 2015-07-27 DIAGNOSIS — I890 Lymphedema, not elsewhere classified: Secondary | ICD-10-CM | POA: Diagnosis not present

## 2015-07-27 DIAGNOSIS — I5022 Chronic systolic (congestive) heart failure: Secondary | ICD-10-CM | POA: Diagnosis not present

## 2015-07-27 DIAGNOSIS — M545 Low back pain: Secondary | ICD-10-CM | POA: Diagnosis not present

## 2015-07-27 DIAGNOSIS — I872 Venous insufficiency (chronic) (peripheral): Secondary | ICD-10-CM | POA: Diagnosis not present

## 2015-07-27 DIAGNOSIS — N183 Chronic kidney disease, stage 3 (moderate): Secondary | ICD-10-CM | POA: Diagnosis not present

## 2015-07-27 DIAGNOSIS — I129 Hypertensive chronic kidney disease with stage 1 through stage 4 chronic kidney disease, or unspecified chronic kidney disease: Secondary | ICD-10-CM | POA: Diagnosis not present

## 2015-07-28 ENCOUNTER — Encounter: Payer: Self-pay | Admitting: Interventional Cardiology

## 2015-07-28 ENCOUNTER — Ambulatory Visit (INDEPENDENT_AMBULATORY_CARE_PROVIDER_SITE_OTHER): Payer: Medicare Other | Admitting: Interventional Cardiology

## 2015-07-28 VITALS — BP 142/80 | HR 74 | Ht 59.0 in | Wt 144.1 lb

## 2015-07-28 DIAGNOSIS — I5022 Chronic systolic (congestive) heart failure: Secondary | ICD-10-CM

## 2015-07-28 DIAGNOSIS — N183 Chronic kidney disease, stage 3 unspecified: Secondary | ICD-10-CM

## 2015-07-28 DIAGNOSIS — I890 Lymphedema, not elsewhere classified: Secondary | ICD-10-CM | POA: Diagnosis not present

## 2015-07-28 DIAGNOSIS — I10 Essential (primary) hypertension: Secondary | ICD-10-CM | POA: Diagnosis not present

## 2015-07-28 MED ORDER — TORSEMIDE 20 MG PO TABS: 60.0000 mg | ORAL_TABLET | Freq: Two times a day (BID) | ORAL | Status: DC

## 2015-07-28 MED ORDER — LISINOPRIL 20 MG PO TABS: 20.0000 mg | ORAL_TABLET | Freq: Every day | ORAL | Status: DC

## 2015-07-28 NOTE — Patient Instructions (Signed)
Medication Instructions:  Your physician has recommended you make the following change in your medication:  1) INCREASE Lisinopril 20mg  daily 2) INCREASE Torsemide to 60mg  twice daily   Labwork: Your physician recommends that you return for lab work in: 1 week (Bmet)   Testing/Procedures: None ordered  Follow-Up: Your physician recommends that you schedule a follow-up appointment in: 1 month with an NP/PA  Any Other Special Instructions Will Be Listed Below (If Applicable).

## 2015-07-29 DIAGNOSIS — M545 Low back pain: Secondary | ICD-10-CM | POA: Diagnosis not present

## 2015-07-29 DIAGNOSIS — I872 Venous insufficiency (chronic) (peripheral): Secondary | ICD-10-CM | POA: Diagnosis not present

## 2015-07-29 DIAGNOSIS — I890 Lymphedema, not elsewhere classified: Secondary | ICD-10-CM | POA: Diagnosis not present

## 2015-07-29 DIAGNOSIS — N183 Chronic kidney disease, stage 3 (moderate): Secondary | ICD-10-CM | POA: Diagnosis not present

## 2015-07-29 DIAGNOSIS — I129 Hypertensive chronic kidney disease with stage 1 through stage 4 chronic kidney disease, or unspecified chronic kidney disease: Secondary | ICD-10-CM | POA: Diagnosis not present

## 2015-07-29 DIAGNOSIS — I5022 Chronic systolic (congestive) heart failure: Secondary | ICD-10-CM | POA: Diagnosis not present

## 2015-08-03 DIAGNOSIS — I5022 Chronic systolic (congestive) heart failure: Secondary | ICD-10-CM | POA: Diagnosis not present

## 2015-08-03 DIAGNOSIS — M545 Low back pain: Secondary | ICD-10-CM | POA: Diagnosis not present

## 2015-08-03 DIAGNOSIS — I129 Hypertensive chronic kidney disease with stage 1 through stage 4 chronic kidney disease, or unspecified chronic kidney disease: Secondary | ICD-10-CM | POA: Diagnosis not present

## 2015-08-03 DIAGNOSIS — I890 Lymphedema, not elsewhere classified: Secondary | ICD-10-CM | POA: Diagnosis not present

## 2015-08-03 DIAGNOSIS — N183 Chronic kidney disease, stage 3 (moderate): Secondary | ICD-10-CM | POA: Diagnosis not present

## 2015-08-03 DIAGNOSIS — I872 Venous insufficiency (chronic) (peripheral): Secondary | ICD-10-CM | POA: Diagnosis not present

## 2015-08-04 ENCOUNTER — Other Ambulatory Visit: Payer: Self-pay

## 2015-08-05 ENCOUNTER — Telehealth: Payer: Self-pay

## 2015-08-05 ENCOUNTER — Other Ambulatory Visit (INDEPENDENT_AMBULATORY_CARE_PROVIDER_SITE_OTHER): Payer: Medicare Other | Admitting: *Deleted

## 2015-08-05 DIAGNOSIS — I872 Venous insufficiency (chronic) (peripheral): Secondary | ICD-10-CM | POA: Diagnosis not present

## 2015-08-05 DIAGNOSIS — M545 Low back pain: Secondary | ICD-10-CM | POA: Diagnosis not present

## 2015-08-05 DIAGNOSIS — I129 Hypertensive chronic kidney disease with stage 1 through stage 4 chronic kidney disease, or unspecified chronic kidney disease: Secondary | ICD-10-CM | POA: Diagnosis not present

## 2015-08-05 DIAGNOSIS — I10 Essential (primary) hypertension: Secondary | ICD-10-CM | POA: Diagnosis not present

## 2015-08-05 DIAGNOSIS — I5022 Chronic systolic (congestive) heart failure: Secondary | ICD-10-CM | POA: Diagnosis not present

## 2015-08-05 DIAGNOSIS — I890 Lymphedema, not elsewhere classified: Secondary | ICD-10-CM | POA: Diagnosis not present

## 2015-08-05 DIAGNOSIS — N183 Chronic kidney disease, stage 3 (moderate): Secondary | ICD-10-CM | POA: Diagnosis not present

## 2015-08-05 NOTE — Telephone Encounter (Signed)
Lmom. The [employer] form that pt dropped of, is completed. Pt is to call to let us know if we should mail, or is she will come to the office to pick it up

## 2015-08-06 LAB — BASIC METABOLIC PANEL
BUN: 54 mg/dL — AB (ref 7–25)
CALCIUM: 9.5 mg/dL (ref 8.6–10.4)
CHLORIDE: 102 mmol/L (ref 98–110)
CO2: 27 mmol/L (ref 20–31)
Creat: 1.79 mg/dL — ABNORMAL HIGH (ref 0.60–0.88)
Glucose, Bld: 107 mg/dL — ABNORMAL HIGH (ref 65–99)
Potassium: 3.9 mmol/L (ref 3.5–5.3)
Sodium: 138 mmol/L (ref 135–146)

## 2015-08-09 ENCOUNTER — Telehealth: Payer: Self-pay

## 2015-08-09 ENCOUNTER — Ambulatory Visit: Payer: Self-pay | Admitting: Neurology

## 2015-08-09 NOTE — Telephone Encounter (Signed)
-----   Message from Belva Crome, MD sent at 08/08/2015 12:56 PM EDT ----- Kidney function is worsening. Has lower extremity edema improved? No change in medical therapy required at the current time.

## 2015-08-09 NOTE — Telephone Encounter (Signed)
Pt aware of lab results. Kidney function is worsening. Has lower extremity edema improved? No change in medical therapy required at the current time.     Pt sts that there has been some improvement in her LE swelling. Pt sts that the progress has been gradual    Adv pt that the [place] form she dropped has been completed is is ready for pick up. Pt rrst we mail the form to her. Verified pt mailing address.adv her I would place it in the mail today. Pt voiced appreciation and verbalized understanding.

## 2015-08-10 ENCOUNTER — Ambulatory Visit: Payer: Medicare Other | Admitting: Pulmonary Disease

## 2015-08-10 DIAGNOSIS — I5022 Chronic systolic (congestive) heart failure: Secondary | ICD-10-CM | POA: Diagnosis not present

## 2015-08-10 DIAGNOSIS — I890 Lymphedema, not elsewhere classified: Secondary | ICD-10-CM | POA: Diagnosis not present

## 2015-08-10 DIAGNOSIS — I872 Venous insufficiency (chronic) (peripheral): Secondary | ICD-10-CM | POA: Diagnosis not present

## 2015-08-10 DIAGNOSIS — N183 Chronic kidney disease, stage 3 (moderate): Secondary | ICD-10-CM | POA: Diagnosis not present

## 2015-08-10 DIAGNOSIS — M545 Low back pain: Secondary | ICD-10-CM | POA: Diagnosis not present

## 2015-08-10 DIAGNOSIS — I129 Hypertensive chronic kidney disease with stage 1 through stage 4 chronic kidney disease, or unspecified chronic kidney disease: Secondary | ICD-10-CM | POA: Diagnosis not present

## 2015-08-12 DIAGNOSIS — I5022 Chronic systolic (congestive) heart failure: Secondary | ICD-10-CM | POA: Diagnosis not present

## 2015-08-12 DIAGNOSIS — I890 Lymphedema, not elsewhere classified: Secondary | ICD-10-CM | POA: Diagnosis not present

## 2015-08-12 DIAGNOSIS — N183 Chronic kidney disease, stage 3 (moderate): Secondary | ICD-10-CM | POA: Diagnosis not present

## 2015-08-12 DIAGNOSIS — I872 Venous insufficiency (chronic) (peripheral): Secondary | ICD-10-CM | POA: Diagnosis not present

## 2015-08-12 DIAGNOSIS — I129 Hypertensive chronic kidney disease with stage 1 through stage 4 chronic kidney disease, or unspecified chronic kidney disease: Secondary | ICD-10-CM | POA: Diagnosis not present

## 2015-08-12 DIAGNOSIS — M545 Low back pain: Secondary | ICD-10-CM | POA: Diagnosis not present

## 2015-08-13 ENCOUNTER — Ambulatory Visit (INDEPENDENT_AMBULATORY_CARE_PROVIDER_SITE_OTHER): Payer: Medicare Other | Admitting: Pulmonary Disease

## 2015-08-13 ENCOUNTER — Encounter: Payer: Self-pay | Admitting: Pulmonary Disease

## 2015-08-13 VITALS — BP 122/60 | HR 76 | Temp 98.3°F | Wt 141.6 lb

## 2015-08-13 DIAGNOSIS — I5022 Chronic systolic (congestive) heart failure: Secondary | ICD-10-CM

## 2015-08-13 DIAGNOSIS — I890 Lymphedema, not elsewhere classified: Secondary | ICD-10-CM

## 2015-08-13 DIAGNOSIS — J449 Chronic obstructive pulmonary disease, unspecified: Secondary | ICD-10-CM | POA: Diagnosis not present

## 2015-08-13 DIAGNOSIS — I10 Essential (primary) hypertension: Secondary | ICD-10-CM

## 2015-08-13 DIAGNOSIS — Z23 Encounter for immunization: Secondary | ICD-10-CM | POA: Diagnosis not present

## 2015-08-13 DIAGNOSIS — I872 Venous insufficiency (chronic) (peripheral): Secondary | ICD-10-CM

## 2015-08-13 DIAGNOSIS — F22 Delusional disorders: Secondary | ICD-10-CM

## 2015-08-13 DIAGNOSIS — D509 Iron deficiency anemia, unspecified: Secondary | ICD-10-CM

## 2015-08-13 DIAGNOSIS — J4489 Other specified chronic obstructive pulmonary disease: Secondary | ICD-10-CM

## 2015-08-13 MED ORDER — FLUTICASONE-SALMETEROL 115-21 MCG/ACT IN AERO: 2.0000 | INHALATION_SPRAY | Freq: Two times a day (BID) | RESPIRATORY_TRACT | Status: DC

## 2015-08-13 NOTE — Patient Instructions (Signed)
Today we updated your med list in our EPIC system...    Continue your current medications the same...  We are adding a new inhaler for you to use twice daily every day>    Start the new ADVAIR-HFA115 -- 2 puffs twice daily (morning & evening)  We gave you the 2016 Flu vaccine today...  We will arrange for you to see the physicians at [facility] for your Primary Care needs...    Their number is [phone]  Call for any questions...  Let's plan a follow up visit in 6 months to recheck your respiratory problems.Marland KitchenMarland Kitchen

## 2015-08-14 ENCOUNTER — Encounter: Payer: Self-pay | Admitting: Pulmonary Disease

## 2015-08-14 NOTE — Progress Notes (Signed)
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 observation for now; last Hg was 1/15 = 10.6, up from 8.8..[name] ~  Labs 6/15 showed Hg= 10.6 on FeSO4 bid; she continues to f/u w/ DrShadad... ~  Labs 4/16 showed Hg=9.4, B12>1500 and she needs f/u DrShadad...   Past Surgical History  Procedure Laterality Date  . Cholecystectomy    . Tonsillectomy    . Cervical spine surgery      x 2  . Partial hysterectomy    . Lumbar disc surgery    . Esophagogastroduodenoscopy N/A 04/05/2015    Procedure: ESOPHAGOGASTRODUODENOSCOPY (EGD);  Surgeon: MLadene Artist MD;  [facility];  Service: Endoscopy;  Laterality: N/A;  . Flexible sigmoidoscopy N/A 04/05/2015    Procedure: FLEXIBLE SIGMOIDOSCOPY;  Surgeon: MLadene Artist MD;  [facility];  Service: Endoscopy;  Laterality: N/A;    Outpatient Encounter Prescriptions as of 08/13/2015  Medication Sig  . albuterol (PROAIR HFA) 108 (90 BASE) MCG/ACT inhaler INHALE 2 PUFFS EVERY 4 HOURS AS NEEDED FOR WHEEZING  . ALPRAZolam (XANAX) 0.5 MG tablet TAKE 1/2 TO 1 TABLET BY MOUTH THREE TIMES DAILY AS NEEDED FOR NERVES  . ascorbic acid (VITAMIN  C) 500 MG tablet Take 500 mg by mouth daily.  . carvedilol (COREG) 6.25 MG tablet TAKE 1 TABLET BY MOUTH TWICE DAILY  . clotrimazole-betamethasone (LOTRISONE) cream Apply 1 application topically 2 (two) times daily. To her feet  . Ferrous Sulfate Dried 200 (65 FE) MG TABS Take 1 tablet by mouth 2 (two) times daily.  . hydrOXYzine (ATARAX/VISTARIL) 25 MG tablet Take 1 tablet (25 mg total) by mouth every 4 (four) hours as needed. For itching  . lisinopril (PRINIVIL,ZESTRIL) 20 MG tablet Take 1 tablet (20 mg total) by mouth daily.  . ondansetron (ZOFRAN) 4 MG tablet TAKE ONE TABLET BY MOUTH THREE TIMES DAILY AS NEEDED FOR NAUSEA  . potassium chloride (KLOR-CON) 20 MEQ packet Take 20 mEq by mouth 3 (three) times daily.  . silver sulfADIAZINE (SILVADENE) 1 % cream Apply daily  as directed  . theophylline (THEO-24) 400 MG 24 hr capsule Take 1 capsule (400 mg total) by mouth daily.  [name] torsemide (DEMADEX) 20 MG tablet Take 3 tablets (60 mg total) by mouth 2 (two) times daily.  . traMADol (ULTRAM) 50 MG tablet Take 1 tablet (50 mg total) by mouth 3 (three) times daily.  [name] triamcinolone cream (KENALOG) 0.1 % Apply 1 application topically 2 (two) times daily.  . Vitamin D, Ergocalciferol, (DRISDOL) 50000 UNITS CAPS capsule TAKE 1 CAPSULE BY MOUTH EVERY 7 DAYS ON SUNDAY OR MONDAY    Allergies  Allergen Reactions  . Aspirin Other (See Comments)    REACTION: upset stomach  . Codeine Other (See Comments)    REACTION: "MAKES MY BODY GO CRAZY; CRAMPS"  . Latex Nausea And Vomiting    Current Medications, Allergies, Past Medical History, Past Surgical History, Family History, and Social History were reviewed in [employer] record.    Review of Systems        See HPI - all other systems neg except as noted...   The patient complains of dyspnea on exertion, peripheral edema, and difficulty walking.  The patient denies anorexia, fever, weight loss, weight gain, vision loss, decreased  hearing, hoarseness, chest pain, syncope, prolonged cough, headaches, hemoptysis, abdominal pain, melena, hematochezia, severe indigestion/heartburn, hematuria, incontinence, muscle weakness, suspicious skin lesions, transient blindness, depression, unusual weight change, abnormal bleeding, enlarged lymph nodes, and angioedema.     Objective:   Physical Exam     WD, Overweight, 81 y/o BF in NAD... GENERAL:  Alert & oriented x 3... she has obvious paranoid delusions... HEENT:  [place]/AT, EOM-wnl, PERRLA, EACs-clear, TMs-wnl, NOSE-clear, THROAT-clear & wnl. NECK:  Supple w/ fairROM; no JVD; normal carotid impulses w/o bruits; no thyromegaly or nodules palpated; no lymphadenopathy. CHEST:  Clear to P & A; without wheezes/ rales/ or rhonchi heard.[name] HEART:  Regular Rhythm; without murmurs/ rubs/ or gallops detected... ABDOMEN:  Soft & nontender; normal bowel sounds; no organomegaly or masses palpated... EXT:  mod arthritic changes; +varicose veins/ +venous insuffic/ 4+edema in left foot w/ severe stasis dermatitis... NEURO:  CN's intact;  no focal neuro deficits... DERM:  chr lymphedema & dermatitis in skin of LE's, w/ some weeping...  RADIOLOGY DATA:  Reviewed in the EPIC EMR & discussed w/ the patient...  LABORATORY DATA:  Reviewed in the EPIC EMR & discussed w/ the patient...   Assessment & Plan:    COPD, Chr Bronchitis>  She was on Qvar, Proventil, & Theodur and she changed to ADVAIR 100Bid for awhile & we tried to stop the Theodur;  It is always unclear what she is taking as she never brings bottles or list to the office follow up visits, but she stopped the Advair & prefers the Theodur200Bid stating that this helps the most; has rescue inhaler for prn use... 10/14>  We decided to add ADVAIR-HFA 115/21 at 2spBid & gave her the 2016 FLU vaccine today...   HBP>  Controlled on Coreg, Lisinopril & Demedex per DrHSmith; instructed on 2gm Na diet...  Ven Insuffic & Lymphedema LEs>  We reviewed  the need to for 2gm sodium diet, elev legs, support hose vs ACE wraps, continue the diuretic rx; she never went to the wound care clinic in 2012; she went to the new regional Lymphedema clinic in [place] x1 only & needs to return, we ordered [facility] visiting nurses & topical Rx w/ mild soapy water, pat dry, cover w/ silvadene (she didn't let them in  to help); now niece is in charge & requests referral to [facility]=> we requested appt- ?if she ever went... She was supposed to see rehab therapy in [place] w/ [facility] sched per DrHSmith but we don't have notes...  Vit D Defic>  Her Vit D level is improved at 77, continue 50K weekly...  ANEMIA> She has a ?MGUS w/ IgG kappa paraprotein ident in 2012 but f/u labs in 2013-4 did not show monoclonal spike although both IgG & IgA levels are further elevated... She finally saw Heme- DrShadad & his notes are reviewed; he has her on oral iron therapy & observation...  Psychiatric Problem>  She persists w/ paranoid ideations revolving around worker's compensation disability;  She has repeatedly refused to go to mental health or allow me to refer to a psychiatrist;  She was seen in the ER on an Involuntary commitment petition 9/13 (by her son) but the psychiatrist rescinded the petition & sent her home...  She continues to refuse any psychiatric help...   Patient's Medications  New Prescriptions   FLUTICASONE-SALMETEROL (ADVAIR HFA) 115-21 MCG/ACT INHALER    Inhale 2 puffs into the lungs 2 (two) times daily.  Previous Medications   ALBUTEROL (PROAIR HFA) 108 (90 BASE) MCG/ACT INHALER    INHALE 2 PUFFS EVERY 4 HOURS AS NEEDED FOR WHEEZING   ALPRAZOLAM (XANAX) 0.5 MG TABLET    TAKE 1/2 TO 1 TABLET BY MOUTH THREE TIMES DAILY AS NEEDED FOR NERVES   ASCORBIC ACID (VITAMIN C) 500 MG TABLET    Take 500 mg by mouth daily.   CARVEDILOL (COREG) 6.25 MG TABLET    TAKE 1 TABLET BY MOUTH TWICE DAILY   CLOTRIMAZOLE-BETAMETHASONE (LOTRISONE) CREAM    Apply 1 application topically 2 (two) times  daily. To her feet   FERROUS SULFATE DRIED 200 (65 FE) MG TABS    Take 1 tablet by mouth 2 (two) times daily.   HYDROXYZINE (ATARAX/VISTARIL) 25 MG TABLET    Take 1 tablet (25 mg total) by mouth every 4 (four) hours as needed. For itching   LISINOPRIL (PRINIVIL,ZESTRIL) 10 MG TABLET    Take 1 tablet (10 mg total) by mouth daily.   LISINOPRIL (PRINIVIL,ZESTRIL) 20 MG TABLET    Take 1 tablet (20 mg total) by mouth daily.   ONDANSETRON (ZOFRAN) 4 MG TABLET    TAKE ONE TABLET BY MOUTH THREE TIMES DAILY AS NEEDED FOR NAUSEA   POTASSIUM CHLORIDE (KLOR-CON) 20 MEQ PACKET    Take 20 mEq by mouth 3 (three) times daily.   SILVER SULFADIAZINE (SILVADENE) 1 % CREAM    Apply daily as directed   THEOPHYLLINE (THEO-24) 400 MG 24 HR CAPSULE    Take 1 capsule (400 mg total) by mouth daily.   TORSEMIDE (DEMADEX) 20 MG TABLET    Take 3 tablets (60 mg total) by mouth 2 (two) times daily.   TRAMADOL (ULTRAM) 50 MG TABLET    Take 1 tablet (50 mg total) by mouth 3 (three) times daily.   TRIAMCINOLONE CREAM (KENALOG) 0.1 %    Apply 1 application topically 2 (two) times daily.   VITAMIN D, ERGOCALCIFEROL, (DRISDOL) 50000 UNITS CAPS CAPSULE    TAKE 1 CAPSULE BY MOUTH EVERY 7 DAYS ON SUNDAY OR MONDAY  Modified Medications   No medications on file  Discontinued Medications   No medications on file

## 2015-08-18 DIAGNOSIS — I872 Venous insufficiency (chronic) (peripheral): Secondary | ICD-10-CM | POA: Diagnosis not present

## 2015-08-18 DIAGNOSIS — M545 Low back pain: Secondary | ICD-10-CM | POA: Diagnosis not present

## 2015-08-18 DIAGNOSIS — N183 Chronic kidney disease, stage 3 (moderate): Secondary | ICD-10-CM | POA: Diagnosis not present

## 2015-08-18 DIAGNOSIS — I129 Hypertensive chronic kidney disease with stage 1 through stage 4 chronic kidney disease, or unspecified chronic kidney disease: Secondary | ICD-10-CM | POA: Diagnosis not present

## 2015-08-18 DIAGNOSIS — I5022 Chronic systolic (congestive) heart failure: Secondary | ICD-10-CM | POA: Diagnosis not present

## 2015-08-18 DIAGNOSIS — I890 Lymphedema, not elsewhere classified: Secondary | ICD-10-CM | POA: Diagnosis not present

## 2015-08-20 DIAGNOSIS — I872 Venous insufficiency (chronic) (peripheral): Secondary | ICD-10-CM | POA: Diagnosis not present

## 2015-08-20 DIAGNOSIS — I890 Lymphedema, not elsewhere classified: Secondary | ICD-10-CM | POA: Diagnosis not present

## 2015-08-20 DIAGNOSIS — I129 Hypertensive chronic kidney disease with stage 1 through stage 4 chronic kidney disease, or unspecified chronic kidney disease: Secondary | ICD-10-CM | POA: Diagnosis not present

## 2015-08-20 DIAGNOSIS — I5022 Chronic systolic (congestive) heart failure: Secondary | ICD-10-CM | POA: Diagnosis not present

## 2015-08-20 DIAGNOSIS — M545 Low back pain: Secondary | ICD-10-CM | POA: Diagnosis not present

## 2015-08-20 DIAGNOSIS — N183 Chronic kidney disease, stage 3 (moderate): Secondary | ICD-10-CM | POA: Diagnosis not present

## 2015-08-24 ENCOUNTER — Ambulatory Visit: Payer: Self-pay | Admitting: Cardiology

## 2015-08-25 DIAGNOSIS — N183 Chronic kidney disease, stage 3 (moderate): Secondary | ICD-10-CM | POA: Diagnosis not present

## 2015-08-25 DIAGNOSIS — I5022 Chronic systolic (congestive) heart failure: Secondary | ICD-10-CM | POA: Diagnosis not present

## 2015-08-25 DIAGNOSIS — M545 Low back pain: Secondary | ICD-10-CM | POA: Diagnosis not present

## 2015-08-25 DIAGNOSIS — I129 Hypertensive chronic kidney disease with stage 1 through stage 4 chronic kidney disease, or unspecified chronic kidney disease: Secondary | ICD-10-CM | POA: Diagnosis not present

## 2015-08-25 DIAGNOSIS — I872 Venous insufficiency (chronic) (peripheral): Secondary | ICD-10-CM | POA: Diagnosis not present

## 2015-08-25 DIAGNOSIS — I890 Lymphedema, not elsewhere classified: Secondary | ICD-10-CM | POA: Diagnosis not present

## 2015-08-26 ENCOUNTER — Telehealth: Payer: Self-pay | Admitting: Interventional Cardiology

## 2015-08-26 DIAGNOSIS — M545 Low back pain: Secondary | ICD-10-CM | POA: Diagnosis not present

## 2015-08-26 DIAGNOSIS — I872 Venous insufficiency (chronic) (peripheral): Secondary | ICD-10-CM | POA: Diagnosis not present

## 2015-08-26 DIAGNOSIS — I890 Lymphedema, not elsewhere classified: Secondary | ICD-10-CM | POA: Diagnosis not present

## 2015-08-26 DIAGNOSIS — N183 Chronic kidney disease, stage 3 (moderate): Secondary | ICD-10-CM | POA: Diagnosis not present

## 2015-08-26 DIAGNOSIS — I129 Hypertensive chronic kidney disease with stage 1 through stage 4 chronic kidney disease, or unspecified chronic kidney disease: Secondary | ICD-10-CM | POA: Diagnosis not present

## 2015-08-26 DIAGNOSIS — I5022 Chronic systolic (congestive) heart failure: Secondary | ICD-10-CM | POA: Diagnosis not present

## 2015-08-26 NOTE — Telephone Encounter (Signed)
There was voicemail on [employer] mainline from [place] requesting a renewal verification for TORSEMIDE 20mg  from [facility]. This is not a patient that [place] has seen in the office; Dr. [name] is this pt's MD.  Please contact [facility] at [phone] using reference # [id number].

## 2015-08-27 ENCOUNTER — Other Ambulatory Visit: Payer: Self-pay

## 2015-08-27 DIAGNOSIS — I5022 Chronic systolic (congestive) heart failure: Secondary | ICD-10-CM

## 2015-08-27 MED ORDER — TORSEMIDE 20 MG PO TABS: 60.0000 mg | ORAL_TABLET | Freq: Two times a day (BID) | ORAL | Status: DC

## 2015-08-31 ENCOUNTER — Ambulatory Visit (INDEPENDENT_AMBULATORY_CARE_PROVIDER_SITE_OTHER): Payer: Medicare Other | Admitting: Cardiology

## 2015-08-31 ENCOUNTER — Encounter: Payer: Self-pay | Admitting: Cardiology

## 2015-08-31 VITALS — BP 152/90 | HR 64 | Ht 59.0 in | Wt 145.8 lb

## 2015-08-31 DIAGNOSIS — N183 Chronic kidney disease, stage 3 unspecified: Secondary | ICD-10-CM

## 2015-08-31 DIAGNOSIS — I272 Other secondary pulmonary hypertension: Secondary | ICD-10-CM | POA: Diagnosis not present

## 2015-08-31 DIAGNOSIS — IMO0002 Reserved for concepts with insufficient information to code with codable children: Secondary | ICD-10-CM

## 2015-08-31 DIAGNOSIS — I5022 Chronic systolic (congestive) heart failure: Secondary | ICD-10-CM | POA: Diagnosis not present

## 2015-08-31 DIAGNOSIS — I890 Lymphedema, not elsewhere classified: Secondary | ICD-10-CM

## 2015-08-31 DIAGNOSIS — I10 Essential (primary) hypertension: Secondary | ICD-10-CM | POA: Diagnosis not present

## 2015-08-31 LAB — BASIC METABOLIC PANEL
BUN: 36 mg/dL — AB (ref 7–25)
CALCIUM: 9.8 mg/dL (ref 8.6–10.4)
CO2: 28 mmol/L (ref 20–31)
CREATININE: 1.57 mg/dL — AB (ref 0.60–0.88)
Chloride: 100 mmol/L (ref 98–110)
Glucose, Bld: 90 mg/dL (ref 65–99)
Potassium: 4 mmol/L (ref 3.5–5.3)
Sodium: 134 mmol/L — ABNORMAL LOW (ref 135–146)

## 2015-08-31 NOTE — Patient Instructions (Addendum)
Medication Instructions:  1) Please take full 20mg  dose of Lisinopril in the mornings  Labwork: BMET today  Testing/Procedures: None  Follow-Up: Your physician recommends that you schedule a follow-up appointment in: 1 month with Cecilie Kicks, NP.   Any Other Special Instructions Will Be Listed Below (If Applicable).  We have sent a prescription with you today to give to [place] to have PT continued and bilateral leg wrappings.  Please decrease the amount of soups you are eating due to the amount of salt found in soups.   If you need a refill on your cardiac medications before your next appointment, please call your pharmacy.

## 2015-08-31 NOTE — Progress Notes (Signed)
Cardiology Office Note   Date:  08/31/2015   ID:  [name], DOB 03/04/1933, MRN [id number]  PCP:  [name], MD and now Dr.   Cardiologist:  Dr. [name]    Chief Complaint  Patient presents with  . Hypertension    BP elevated but was controlled on recent visit with Dr. Lenna Gilford.       History of Present Illness: [name] is a 81 y.o. female who presents for follow up after visit recently for HTN and CHF.  She has a history of venous insufficiency, COPD, hypertension, and chronic systolic heart failure with EF 35-40% (2015) with subsequent perfusion study with EF of 50%. Significant lymphedema, currently better but with wraps per home health weekly.  On last visit her lisinopril was increased to 20 mg daily and her torsemide to 60 mg BID from once daily.    She is back today for follow up.  Her most recent labs with elevated Cr to 1.79 from 1.37.  Her wt is still up but her SOB is improved.  She does have a cough and Dr. Lenna Gilford added an inhaler.    Past Medical History  Diagnosis Date  . Obstructive chronic bronchitis without exacerbation ([place])   . Unspecified essential hypertension   . Unspecified venous (peripheral) insufficiency   . Edema 2005    "since put the glass in them"   . Other abnormal glucose   . Lumbago   . Unspecified nonpsychotic mental disorder   . Unspecified venous (peripheral) insufficiency   . Unspecified vitamin D deficiency   . Anemia, unspecified   . Anxiety   . Arthritis   . Asthma   . Systolic heart failure (H[facility])     EF 35 to 40% per echo January 2015  . Depression   . GERD (gastroesophageal reflux disease)     Past Surgical History  Procedure Laterality Date  . Cholecystectomy    . Tonsillectomy    . Cervical spine surgery      x 2  . Partial hysterectomy    . Lumbar disc surgery    . Esophagogastroduodenoscopy N/A 04/05/2015    Procedure: ESOPHAGOGASTRODUODENOSCOPY (EGD);  Surgeon: [name], MD;  [facility];  Service: Endoscopy;  Laterality: N/A;  . Flexible sigmoidoscopy N/A 04/05/2015    Procedure: FLEXIBLE SIGMOIDOSCOPY;  Surgeon: [name], MD;  [facility];  Service: Endoscopy;  Laterality: N/A;     Current Outpatient Prescriptions  Medication Sig Dispense Refill  . albuterol (PROAIR HFA) 108 (90 BASE) MCG/ACT inhaler INHALE 2 PUFFS EVERY 4 HOURS AS NEEDED FOR WHEEZING 8.5 Inhaler 2  . ALPRAZolam (XANAX) 0.5 MG tablet TAKE 1/2 TO 1 TABLET BY MOUTH THREE TIMES DAILY AS NEEDED FOR NERVES 30 tablet 0  . ascorbic acid (VITAMIN C) 500 MG tablet Take 500 mg by mouth daily.    . carvedilol (COREG) 6.25 MG tablet TAKE 1 TABLET BY MOUTH TWICE DAILY 180 tablet 2  . clotrimazole-betamethasone (LOTRISONE) cream Apply 1 application topically 2 (two) times daily. To her feet 129 g 1  . Ferrous Sulfate Dried 200 (65 FE) MG TABS Take 1 tablet by mouth 2 (two) times daily. 30 tablet 2  . fluticasone-salmeterol (ADVAIR HFA) 115-21 MCG/ACT inhaler Inhale 2 puffs into the lungs 2 (two) times daily. 1 Inhaler 12  . hydrOXYzine (ATARAX/VISTARIL) 25 MG tablet Take 1 tablet (25 mg total) by mouth every 4 (four) hours as needed. For itching 100 tablet 3  .  lisinopril (PRINIVIL,ZESTRIL) 20 MG tablet Take 1 tablet (20 mg total) by mouth daily. 30 tablet 11  . ondansetron (ZOFRAN) 4 MG tablet TAKE ONE TABLET BY MOUTH THREE TIMES DAILY AS NEEDED FOR NAUSEA 30 tablet 0  . potassium chloride (KLOR-CON) 20 MEQ packet Take 20 mEq by mouth 3 (three) times daily. 270 tablet 1  . silver sulfADIAZINE (SILVADENE) 1 % cream Apply daily as directed 100 g 2  . theophylline (THEO-24) 400 MG 24 hr capsule Take 1 capsule (400 mg total) by mouth daily. 90 capsule 2  . torsemide (DEMADEX) 20 MG tablet Take 3 tablets (60 mg total) by mouth 2 (two) times daily. 540 tablet 1  . traMADol (ULTRAM) 50 MG tablet Take 1 tablet (50 mg total) by mouth 3 (three) times daily. 30 tablet 0  . triamcinolone cream (KENALOG) 0.1  % Apply 1 application topically 2 (two) times daily.  2  . Vitamin D, Ergocalciferol, (DRISDOL) 50000 UNITS CAPS capsule TAKE 1 CAPSULE BY MOUTH EVERY 7 DAYS ON SUNDAY OR MONDAY 4 capsule 0   No current facility-administered medications for this visit.    Allergies:   Aspirin; Codeine; and Latex    Social History:  The patient  reports that she has never smoked. She has never used smokeless tobacco. She reports that she does not drink alcohol or use illicit drugs.   Family History:  The patient's family history includes Cancer in her paternal aunt; Diabetes in her mother and paternal grandfather; Heart disease in her maternal aunt.    ROS:  General:no colds or fevers, no weight changes Skin:no rashes or ulcers HEENT:no blurred vision, no congestion CV:see HPI PUL:see HPI GI:no diarrhea constipation or melena, no indigestion GU:no hematuria, no dysuria MS:no joint pain, no claudication Neuro:no syncope, no lightheadedness Endo:no diabetes, no thyroid disease  Wt Readings from Last 3 Encounters:  08/31/15 145 lb 12.8 oz (66.134 kg)  08/13/15 141 lb 9.6 oz (64.229 kg)  07/28/15 144 lb 1.9 oz (65.372 kg)     PHYSICAL EXAM: VS:  BP 152/90 mmHg  Pulse 64  Ht 4\' 11"  (1.499 m)  Wt 145 lb 12.8 oz (66.134 kg)  BMI 29.43 kg/m2  SpO2 99% , BMI Body mass index is 29.43 kg/(m^2). General:Pleasant affect, NAD Skin:Warm and dry, brisk capillary refill HEENT:normocephalic, sclera clear, mucus membranes moist Neck:supple, no JVD, no bruits  Heart:S1S2 RRR without murmur, gallup, rub or click Lungs:clear-post. without rales, rhonchi, + wheezes ant. JJH:ERDE, non tender, + BS, do not palpate liver spleen or masses Ext:+ lower ext edema rt > Lt. Legs wrapped, and still very large on Lt foot.   2+ radial pulses Neuro:alert and oriented, MAE, follows commands, + facial symmetry    EKG:  EKG is NOT ordered today.   Recent Labs: 02/11/2015: Pro B Natriuretic peptide (BNP)  262.0* 04/03/2015: ALT 23 04/19/2015: Hemoglobin 11.5*; Platelets 370.0 08/05/2015: BUN 54*; Creat 1.79*; Potassium 3.9; Sodium 138    Lipid Panel    Component Value Date/Time   CHOL 182 01/28/2010 1023   TRIG 75.0 01/28/2010 1023   HDL 57.90 01/28/2010 1023   CHOLHDL 3 01/28/2010 1023   VLDL 15.0 01/28/2010 1023   LDLCALC 109* 01/28/2010 1023       Other studies Reviewed: Additional studies/ records that were reviewed today include: echo from 2015.  nuc study 2015 no ischemia , previous office notes and Dr. Jeannine Kitten office notes.    ASSESSMENT AND PLAN:  1.  HTN continues to be elevated though  on visit with Dr. Lenna Gilford it was controlled.  We discussed decreased sodium diet.  She does eat soup, from a can- most days.  We discussed only one serving a day and to change to low sodium.    2. Systolic/Diastolic HF improved though wt still elevated and lower ext edema.  Will check BMP today and decrease demadex if needed.  She may need hydralazine for improved BP control but will see how she does with less salt in her diet.  I will see her back in 1 month.     Current medicines are reviewed with the patient today.  The patient Has no concerns regarding medicines.  The following changes have been made:  See above Labs/ tests ordered today include:see above  Disposition:   FU:  see above  Lennie Muckle, NP  08/31/2015 3:48 PM    [facility] [address], [place], [place] Phone: [phone]; Fax: [phone]

## 2015-09-01 ENCOUNTER — Telehealth: Payer: Self-pay | Admitting: Cardiology

## 2015-09-01 DIAGNOSIS — I890 Lymphedema, not elsewhere classified: Secondary | ICD-10-CM | POA: Diagnosis not present

## 2015-09-01 DIAGNOSIS — I872 Venous insufficiency (chronic) (peripheral): Secondary | ICD-10-CM | POA: Diagnosis not present

## 2015-09-01 DIAGNOSIS — N183 Chronic kidney disease, stage 3 (moderate): Secondary | ICD-10-CM | POA: Diagnosis not present

## 2015-09-01 DIAGNOSIS — I5022 Chronic systolic (congestive) heart failure: Secondary | ICD-10-CM | POA: Diagnosis not present

## 2015-09-01 DIAGNOSIS — M545 Low back pain: Secondary | ICD-10-CM | POA: Diagnosis not present

## 2015-09-01 DIAGNOSIS — I129 Hypertensive chronic kidney disease with stage 1 through stage 4 chronic kidney disease, or unspecified chronic kidney disease: Secondary | ICD-10-CM | POA: Diagnosis not present

## 2015-09-01 NOTE — Telephone Encounter (Signed)
Pt seen by Cecilie Kicks, NP on 08/31/15. Mickel Baas wanted pt to f/u in 1 month. Attempted to call pt to schedule, no answer, left message to call back. Was looking at putting pt on schedule for 12/5.

## 2015-09-02 NOTE — Telephone Encounter (Signed)
Spoke with pt and scheduled her to see Cecilie Kicks, NP in one month as planned. While speaking with pt she informed me that she gave the Home Health nurse the order to continue wrapping her legs and was told by them that they would not be able to continue because her insurance will no longer cover it. Advised pt that I will route this information to Cecilie Kicks, NP for review and advisement for possible recommendations.

## 2015-09-02 NOTE — Telephone Encounter (Signed)
Ok, not much we can do.  If swelling continues we will address.

## 2015-09-02 NOTE — Telephone Encounter (Signed)
Attempted to call pt and line was busy. Will try again later.

## 2015-09-03 NOTE — Telephone Encounter (Signed)
Left message for patient to call back  

## 2015-09-06 DIAGNOSIS — I129 Hypertensive chronic kidney disease with stage 1 through stage 4 chronic kidney disease, or unspecified chronic kidney disease: Secondary | ICD-10-CM | POA: Diagnosis not present

## 2015-09-06 DIAGNOSIS — I890 Lymphedema, not elsewhere classified: Secondary | ICD-10-CM | POA: Diagnosis not present

## 2015-09-06 DIAGNOSIS — N183 Chronic kidney disease, stage 3 (moderate): Secondary | ICD-10-CM | POA: Diagnosis not present

## 2015-09-06 DIAGNOSIS — M545 Low back pain: Secondary | ICD-10-CM | POA: Diagnosis not present

## 2015-09-06 DIAGNOSIS — I5022 Chronic systolic (congestive) heart failure: Secondary | ICD-10-CM | POA: Diagnosis not present

## 2015-09-06 DIAGNOSIS — I872 Venous insufficiency (chronic) (peripheral): Secondary | ICD-10-CM | POA: Diagnosis not present

## 2015-09-06 NOTE — Telephone Encounter (Signed)
Spoke with pt and informed her that [name] said not much we can do in regards to her legs getting wrapped but we will continue to address any swelling. Advised pt to call with any edema issues. Pt verbalized understanding and was in agreement with this plan.

## 2015-09-07 ENCOUNTER — Telehealth: Payer: Self-pay | Admitting: *Deleted

## 2015-09-07 NOTE — Telephone Encounter (Signed)
Called to make pt aware of her lab results.  Pt verbalized understanding

## 2015-09-09 DIAGNOSIS — N183 Chronic kidney disease, stage 3 (moderate): Secondary | ICD-10-CM | POA: Diagnosis not present

## 2015-09-09 DIAGNOSIS — M545 Low back pain: Secondary | ICD-10-CM | POA: Diagnosis not present

## 2015-09-09 DIAGNOSIS — I5022 Chronic systolic (congestive) heart failure: Secondary | ICD-10-CM | POA: Diagnosis not present

## 2015-09-09 DIAGNOSIS — I890 Lymphedema, not elsewhere classified: Secondary | ICD-10-CM | POA: Diagnosis not present

## 2015-09-09 DIAGNOSIS — I872 Venous insufficiency (chronic) (peripheral): Secondary | ICD-10-CM | POA: Diagnosis not present

## 2015-09-09 DIAGNOSIS — I129 Hypertensive chronic kidney disease with stage 1 through stage 4 chronic kidney disease, or unspecified chronic kidney disease: Secondary | ICD-10-CM | POA: Diagnosis not present

## 2015-09-16 ENCOUNTER — Other Ambulatory Visit: Payer: Self-pay | Admitting: Pulmonary Disease

## 2015-09-16 NOTE — Telephone Encounter (Signed)
Electronic refill request received for Xanax 0.5mg    Last OV 08/13/15, pending OV 02/08/16 Last filled 04/06/15 #30 with 0 refills Take 1/2 to 1 tablet by mouth three times daily as needed for nerves.   SN please advise if ok to refill  Thanks!

## 2015-09-17 NOTE — Telephone Encounter (Signed)
Per SN>>Ok to refill xanax 0.5mg  #90 with instructions of 1/2-1 tablet PO TID for anxiety. No additional refills  Refill called into pt's pharmacy  Nothing further is needed

## 2015-09-21 ENCOUNTER — Other Ambulatory Visit: Payer: Self-pay | Admitting: Pulmonary Disease

## 2015-10-04 ENCOUNTER — Encounter: Payer: Medicare Other | Admitting: Cardiology

## 2015-10-04 NOTE — Progress Notes (Signed)
This encounter was created in error - please disregard.

## 2015-10-05 ENCOUNTER — Encounter: Payer: Self-pay | Admitting: Cardiology

## 2015-10-05 ENCOUNTER — Ambulatory Visit (INDEPENDENT_AMBULATORY_CARE_PROVIDER_SITE_OTHER): Payer: Medicare Other | Admitting: Cardiology

## 2015-10-05 VITALS — BP 110/64 | HR 66 | Ht 59.0 in | Wt 152.8 lb

## 2015-10-05 DIAGNOSIS — I10 Essential (primary) hypertension: Secondary | ICD-10-CM

## 2015-10-05 DIAGNOSIS — N183 Chronic kidney disease, stage 3 unspecified: Secondary | ICD-10-CM

## 2015-10-05 DIAGNOSIS — E876 Hypokalemia: Secondary | ICD-10-CM | POA: Diagnosis not present

## 2015-10-05 DIAGNOSIS — I890 Lymphedema, not elsewhere classified: Secondary | ICD-10-CM

## 2015-10-05 DIAGNOSIS — I159 Secondary hypertension, unspecified: Secondary | ICD-10-CM

## 2015-10-05 DIAGNOSIS — I872 Venous insufficiency (chronic) (peripheral): Secondary | ICD-10-CM

## 2015-10-05 LAB — BASIC METABOLIC PANEL
BUN: 49 mg/dL — ABNORMAL HIGH (ref 7–25)
CALCIUM: 9.3 mg/dL (ref 8.6–10.4)
CHLORIDE: 101 mmol/L (ref 98–110)
CO2: 27 mmol/L (ref 20–31)
Creat: 2.02 mg/dL — ABNORMAL HIGH (ref 0.60–0.88)
GLUCOSE: 82 mg/dL (ref 65–99)
Potassium: 4 mmol/L (ref 3.5–5.3)
SODIUM: 136 mmol/L (ref 135–146)

## 2015-10-05 MED ORDER — POTASSIUM CHLORIDE 20 MEQ PO PACK
20.0000 meq | PACK | Freq: Three times a day (TID) | ORAL | Status: DC
Start: 2015-10-05 — End: 2015-10-06

## 2015-10-05 NOTE — Patient Instructions (Addendum)
Medication Instructions:  Your physician recommends that you continue on your current medications as directed. Please refer to the Current Medication list given to you today.   Labwork: BMET   Testing/Procedures: NONE  Follow-Up: Your physician recommends that you schedule a follow-up appointment in: NEXT  AVAILABLE WITH  DR [name]  Any Other Special Instructions Will Be Listed Below (If Applicable).  You have been referred to [place]   IF  Y OU  DO NOT [place]  If you need a refill on your cardiac medications before your next appointment, please call your pharmacy.
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
## 2015-10-06 ENCOUNTER — Telehealth: Payer: Self-pay

## 2015-10-06 DIAGNOSIS — I159 Secondary hypertension, unspecified: Secondary | ICD-10-CM

## 2015-10-06 DIAGNOSIS — Z79899 Other long term (current) drug therapy: Secondary | ICD-10-CM

## 2015-10-06 DIAGNOSIS — I890 Lymphedema, not elsewhere classified: Secondary | ICD-10-CM

## 2015-10-06 DIAGNOSIS — I5022 Chronic systolic (congestive) heart failure: Secondary | ICD-10-CM

## 2015-10-06 DIAGNOSIS — I872 Venous insufficiency (chronic) (peripheral): Secondary | ICD-10-CM

## 2015-10-06 MED ORDER — TORSEMIDE 20 MG PO TABS: 40.0000 mg | ORAL_TABLET | Freq: Every day | ORAL | Status: DC

## 2015-10-06 MED ORDER — POTASSIUM CHLORIDE 20 MEQ PO PACK
40.0000 meq | PACK | Freq: Every day | ORAL | Status: DC
Start: 2015-10-06 — End: 2015-12-03

## 2015-10-06 NOTE — Telephone Encounter (Signed)
Called patient about lab results. Per Cecilie Kicks NP, Have pt decrease her torsemide to 2 tabs daily and her KDUR to 2 tabs daily - recheck BMP next week. Also please arrange appt with Cheral Almas at [facility] for lymphedema [phone], Or if they do not accept then at [facility] they have a lymphedema clinic [phone] but [facility] would be more convenient for the pt. Patient verbalized understanding.

## 2015-10-08 ENCOUNTER — Other Ambulatory Visit: Payer: Self-pay | Admitting: *Deleted

## 2015-10-08 DIAGNOSIS — I159 Secondary hypertension, unspecified: Secondary | ICD-10-CM

## 2015-10-08 DIAGNOSIS — I890 Lymphedema, not elsewhere classified: Secondary | ICD-10-CM

## 2015-10-08 DIAGNOSIS — I872 Venous insufficiency (chronic) (peripheral): Secondary | ICD-10-CM

## 2015-10-08 MED ORDER — POTASSIUM CHLORIDE CRYS ER 20 MEQ PO TBCR: 40.0000 meq | EXTENDED_RELEASE_TABLET | Freq: Once | ORAL | Status: DC

## 2015-10-08 NOTE — Telephone Encounter (Signed)
Referral for [place] rehab services in for patient. [facility] [facility] will call patient for appointment.

## 2015-10-08 NOTE — Telephone Encounter (Signed)
Patient left a voicemail on the refill line requesting a refill on potassium. I see that it was ordered today for the potassium packet, but it was not actually sent to the pharmacy.  Should it be the packet or the tablet? Please advise. Thanks, MI

## 2015-10-08 NOTE — Telephone Encounter (Signed)
Sent in refill for [facility]   Notes Recorded by Isaiah Serge, NP on 10/06/2015 at 12:12 PM Have pt decrease her torsemide to 2 tabs daily and her KDUR to 2 tabs daily - recheck BMP next week. Also please arrange appt with Cheral Almas at [facility] for lymphedema [phone], Or if they do not accept then at [facility] they have a lymphedema clinic [phone] but [facility] would be more convenient for the pt. Thanks.

## 2015-10-12 ENCOUNTER — Telehealth: Payer: Self-pay | Admitting: Pulmonary Disease

## 2015-10-12 NOTE — Telephone Encounter (Signed)
Called and spoke with Pam from [facility] states that pt has been complaining of low back pain, and pain in neck and lower ext [facility] Go would like a verbal order from SN stating that pt can participate in physical therapy services  Dr Lenna Gilford, please advise if you are ok with giving the ok for this order. Thanks   Allergies  Allergen Reactions  . Aspirin Other (See Comments)    REACTION: upset stomach  . Codeine Other (See Comments)    REACTION: "MAKES MY BODY GO CRAZY; CRAMPS"  . Latex Nausea And Vomiting    Current Outpatient Prescriptions on File Prior to Visit  Medication Sig Dispense Refill  . albuterol (PROAIR HFA) 108 (90 BASE) MCG/ACT inhaler INHALE 2 PUFFS EVERY 4 HOURS AS NEEDED FOR WHEEZING 8.5 Inhaler 2  . ALPRAZolam (XANAX) 0.5 MG tablet Take 1/2 to 1 tablet by mouth 3 times a day as needed for nerves.    [name] ascorbic acid (VITAMIN C) 500 MG tablet Take 1,000 mg by mouth daily.     . carvedilol (COREG) 6.25 MG tablet TAKE 1 TABLET BY MOUTH TWICE DAILY 180 tablet 2  . clotrimazole-betamethasone (LOTRISONE) cream Apply 1 application topically 2 (two) times daily. To her feet 129 g 1  . Ferrous Sulfate Dried 200 (65 FE) MG TABS Take 1 tablet by mouth 2 (two) times daily. 30 tablet 2  . fluticasone-salmeterol (ADVAIR HFA) 115-21 MCG/ACT inhaler Inhale 2 puffs into the lungs 2 (two) times daily. 1 Inhaler 12  . hydrOXYzine (ATARAX/VISTARIL) 25 MG tablet Take 1 tablet (25 mg total) by mouth every 4 (four) hours as needed. For itching 100 tablet 3  . lisinopril (PRINIVIL,ZESTRIL) 20 MG tablet Take 1 tablet (20 mg total) by mouth daily. 30 tablet 11  . ondansetron (ZOFRAN) 4 MG tablet TAKE ONE TABLET BY MOUTH THREE TIMES DAILY AS NEEDED FOR NAUSEA 30 tablet 0  . potassium chloride (KLOR-CON) 20 MEQ packet Take 40 mEq by mouth daily.    . potassium chloride SA (K-DUR,KLOR-CON) 20 MEQ tablet Take 2 tablets (40 mEq total) by mouth once. 60 tablet 11  . silver sulfADIAZINE  (SILVADENE) 1 % cream Apply daily as directed 100 g 2  . theophylline (THEO-24) 400 MG 24 hr capsule Take 1 capsule (400 mg total) by mouth daily. 90 capsule 2  . torsemide (DEMADEX) 20 MG tablet Take 2 tablets (40 mg total) by mouth daily.    . traMADol (ULTRAM) 50 MG tablet Take 1 tablet (50 mg total) by mouth 3 (three) times daily. 30 tablet 0  . triamcinolone cream (KENALOG) 0.1 % Apply 1 application topically 2 (two) times daily.  2  . Vitamin D, Ergocalciferol, (DRISDOL) 50000 UNITS CAPS capsule TAKE 1 CAPSULE BY MOUTH EVERY 7 DAYS ON SUNDAY OR MONDAY 4 capsule 0   No current facility-administered medications on file prior to visit.

## 2015-10-13 ENCOUNTER — Other Ambulatory Visit (INDEPENDENT_AMBULATORY_CARE_PROVIDER_SITE_OTHER): Payer: Medicare Other | Admitting: *Deleted

## 2015-10-13 ENCOUNTER — Telehealth: Payer: Self-pay | Admitting: *Deleted

## 2015-10-13 DIAGNOSIS — N183 Chronic kidney disease, stage 3 unspecified: Secondary | ICD-10-CM

## 2015-10-13 DIAGNOSIS — R7989 Other specified abnormal findings of blood chemistry: Secondary | ICD-10-CM

## 2015-10-13 DIAGNOSIS — Z79899 Other long term (current) drug therapy: Secondary | ICD-10-CM

## 2015-10-13 DIAGNOSIS — R799 Abnormal finding of blood chemistry, unspecified: Secondary | ICD-10-CM

## 2015-10-13 LAB — BASIC METABOLIC PANEL
BUN: 37 mg/dL — ABNORMAL HIGH (ref 7–25)
CALCIUM: 9.7 mg/dL (ref 8.6–10.4)
CO2: 25 mmol/L (ref 20–31)
Chloride: 99 mmol/L (ref 98–110)
Creat: 2.45 mg/dL — ABNORMAL HIGH (ref 0.60–0.88)
Glucose, Bld: 73 mg/dL (ref 65–99)
Potassium: 3.6 mmol/L (ref 3.5–5.3)
SODIUM: 135 mmol/L (ref 135–146)

## 2015-10-13 MED ORDER — POTASSIUM CHLORIDE CRYS ER 20 MEQ PO TBCR: 40.0000 meq | EXTENDED_RELEASE_TABLET | Freq: Once | ORAL | Status: DC

## 2015-10-13 NOTE — Telephone Encounter (Signed)
-----   Message from Isaiah Serge, NP sent at 10/13/2015  4:51 PM EST ----- Have pt hold fluid meds and K+ for 2 days then resume 2 tabs as she is taking now.  Please arrange consult with nephrology.

## 2015-10-13 NOTE — Telephone Encounter (Signed)
Per SN>> Ok to approval pt to begin physical therapy through home health services   LM for Pam with [facility] to call office back to give order

## 2015-10-13 NOTE — Telephone Encounter (Signed)
Informed pt of lab results. Pt verbalized understanding. Referral placed for nephrology.

## 2015-10-14 NOTE — Telephone Encounter (Signed)
Called [facility] and spoke with Jeannene Patella - verbal order given for PT Requests that last OV notes be faxed to her at [phone] Nothing further needed.

## 2015-10-15 ENCOUNTER — Other Ambulatory Visit: Payer: Self-pay | Admitting: *Deleted

## 2015-10-15 ENCOUNTER — Ambulatory Visit: Payer: Medicare Other | Admitting: Internal Medicine

## 2015-10-15 DIAGNOSIS — I5022 Chronic systolic (congestive) heart failure: Secondary | ICD-10-CM

## 2015-10-15 MED ORDER — LISINOPRIL 20 MG PO TABS: 20.0000 mg | ORAL_TABLET | Freq: Every day | ORAL | Status: DC

## 2015-10-18 ENCOUNTER — Ambulatory Visit: Payer: Medicare Other | Admitting: Occupational Therapy

## 2015-10-19 ENCOUNTER — Other Ambulatory Visit: Payer: Self-pay | Admitting: Interventional Cardiology

## 2015-10-19 ENCOUNTER — Other Ambulatory Visit: Payer: Self-pay | Admitting: Pulmonary Disease

## 2015-10-19 DIAGNOSIS — I5022 Chronic systolic (congestive) heart failure: Secondary | ICD-10-CM

## 2015-10-19 MED ORDER — ALBUTEROL SULFATE HFA 108 (90 BASE) MCG/ACT IN AERS: INHALATION_SPRAY | RESPIRATORY_TRACT | Status: DC

## 2015-10-19 MED ORDER — LISINOPRIL 20 MG PO TABS: 20.0000 mg | ORAL_TABLET | Freq: Every day | ORAL | Status: DC

## 2015-10-19 MED ORDER — FLUTICASONE-SALMETEROL 115-21 MCG/ACT IN AERO: 2.0000 | INHALATION_SPRAY | Freq: Two times a day (BID) | RESPIRATORY_TRACT | Status: DC

## 2015-10-19 NOTE — Telephone Encounter (Signed)
Received paper refill request for pt's advair to be refilled Per SN>> ok to refill as previously rx'd  Refill sent electronically to pharmacy  Nothing further is needed

## 2015-10-20 ENCOUNTER — Other Ambulatory Visit: Payer: Self-pay | Admitting: *Deleted

## 2015-10-20 ENCOUNTER — Telehealth: Payer: Self-pay | Admitting: *Deleted

## 2015-10-20 MED ORDER — VITAMIN D (ERGOCALCIFEROL) 1.25 MG (50000 UNIT) PO CAPS: ORAL_CAPSULE | ORAL | Status: DC

## 2015-10-20 NOTE — Telephone Encounter (Signed)
Received refill requests for 90 day supply on the follow medications: Clotrimazole cream>last refilled [id number] 1 application topically 2 (two) times daily. To her feet Hydroxyzine HCL 25>last refilled 03/03/15 #100 x 3 refills Take 1 tablet (25 mg total) by mouth every 4 (four) hours as needed. For itching Alprazolam 0.5 mg>09/17/15 #90 x 0 refills TAKE 1/2 TO 1 TABLET 3 TIMES A DAY AS NEEDED FOR NERVES  Please advise SN thanks

## 2015-10-22 MED ORDER — CLOTRIMAZOLE-BETAMETHASONE 1-0.05 % EX CREA: 1.0000 "application " | TOPICAL_CREAM | Freq: Two times a day (BID) | CUTANEOUS | Status: DC

## 2015-10-22 MED ORDER — ALPRAZOLAM 0.5 MG PO TABS: ORAL_TABLET | ORAL | Status: DC

## 2015-10-22 MED ORDER — HYDROXYZINE HCL 25 MG PO TABS: 25.0000 mg | ORAL_TABLET | ORAL | Status: DC | PRN

## 2015-10-22 NOTE — Telephone Encounter (Signed)
Rxs have been refilled per SN. Nothing further was needed.

## 2015-10-22 NOTE — Telephone Encounter (Signed)
Per SN- Ok to refill all the requested meds. Thanks.

## 2015-10-27 ENCOUNTER — Ambulatory Visit: Payer: Medicare Other | Admitting: Occupational Therapy

## 2015-10-27 ENCOUNTER — Encounter: Payer: Self-pay | Admitting: *Deleted

## 2015-10-28 ENCOUNTER — Encounter (HOSPITAL_BASED_OUTPATIENT_CLINIC_OR_DEPARTMENT_OTHER): Payer: Medicare Other

## 2015-10-29 ENCOUNTER — Other Ambulatory Visit: Payer: Self-pay | Admitting: Pulmonary Disease

## 2015-11-02 ENCOUNTER — Ambulatory Visit: Payer: Medicare Other | Admitting: Occupational Therapy

## 2015-11-02 ENCOUNTER — Telehealth: Payer: Self-pay | Admitting: Pulmonary Disease

## 2015-11-02 NOTE — Telephone Encounter (Signed)
Noted  

## 2015-11-03 DIAGNOSIS — I110 Hypertensive heart disease with heart failure: Secondary | ICD-10-CM | POA: Diagnosis not present

## 2015-11-03 DIAGNOSIS — R2681 Unsteadiness on feet: Secondary | ICD-10-CM | POA: Diagnosis not present

## 2015-11-03 DIAGNOSIS — I890 Lymphedema, not elsewhere classified: Secondary | ICD-10-CM | POA: Diagnosis not present

## 2015-11-03 DIAGNOSIS — M47894 Other spondylosis, thoracic region: Secondary | ICD-10-CM | POA: Diagnosis not present

## 2015-11-03 DIAGNOSIS — M1990 Unspecified osteoarthritis, unspecified site: Secondary | ICD-10-CM | POA: Diagnosis not present

## 2015-11-03 DIAGNOSIS — I5022 Chronic systolic (congestive) heart failure: Secondary | ICD-10-CM | POA: Diagnosis not present

## 2015-11-04 ENCOUNTER — Telehealth: Payer: Self-pay | Admitting: Pulmonary Disease

## 2015-11-04 NOTE — Telephone Encounter (Signed)
LMTCB for Joann 

## 2015-11-04 NOTE — Telephone Encounter (Signed)
Spoke with [employer]. Verbal order has been given. Nothing further was needed.

## 2015-11-05 ENCOUNTER — Telehealth: Payer: Self-pay | Admitting: Pulmonary Disease

## 2015-11-05 DIAGNOSIS — I110 Hypertensive heart disease with heart failure: Secondary | ICD-10-CM | POA: Diagnosis not present

## 2015-11-05 DIAGNOSIS — M47894 Other spondylosis, thoracic region: Secondary | ICD-10-CM | POA: Diagnosis not present

## 2015-11-05 DIAGNOSIS — M1990 Unspecified osteoarthritis, unspecified site: Secondary | ICD-10-CM | POA: Diagnosis not present

## 2015-11-05 DIAGNOSIS — R2681 Unsteadiness on feet: Secondary | ICD-10-CM | POA: Diagnosis not present

## 2015-11-05 DIAGNOSIS — I5022 Chronic systolic (congestive) heart failure: Secondary | ICD-10-CM | POA: Diagnosis not present

## 2015-11-05 DIAGNOSIS — I890 Lymphedema, not elsewhere classified: Secondary | ICD-10-CM | POA: Diagnosis not present

## 2015-11-05 NOTE — Telephone Encounter (Signed)
Called spoke with Mechele Claude from [facility]. She was calling as an FYI the times she has visited pt she has been alone. Pt had no complaints. Her BP was 160/100 when she visited pt earlier today. Gave VO med [employer] representative and [employer] officer, [employer].  FYI to SN

## 2015-11-09 ENCOUNTER — Ambulatory Visit: Payer: Medicare Other | Admitting: Occupational Therapy

## 2015-11-11 ENCOUNTER — Other Ambulatory Visit: Payer: Self-pay | Admitting: Pulmonary Disease

## 2015-11-11 DIAGNOSIS — M1990 Unspecified osteoarthritis, unspecified site: Secondary | ICD-10-CM | POA: Diagnosis not present

## 2015-11-11 DIAGNOSIS — M47894 Other spondylosis, thoracic region: Secondary | ICD-10-CM | POA: Diagnosis not present

## 2015-11-11 DIAGNOSIS — I890 Lymphedema, not elsewhere classified: Secondary | ICD-10-CM | POA: Diagnosis not present

## 2015-11-11 DIAGNOSIS — I5022 Chronic systolic (congestive) heart failure: Secondary | ICD-10-CM | POA: Diagnosis not present

## 2015-11-11 DIAGNOSIS — R2681 Unsteadiness on feet: Secondary | ICD-10-CM | POA: Diagnosis not present

## 2015-11-11 DIAGNOSIS — I110 Hypertensive heart disease with heart failure: Secondary | ICD-10-CM | POA: Diagnosis not present

## 2015-11-12 ENCOUNTER — Ambulatory Visit: Payer: Medicare Other | Admitting: Occupational Therapy

## 2015-11-15 DIAGNOSIS — R2681 Unsteadiness on feet: Secondary | ICD-10-CM | POA: Diagnosis not present

## 2015-11-15 DIAGNOSIS — I5022 Chronic systolic (congestive) heart failure: Secondary | ICD-10-CM | POA: Diagnosis not present

## 2015-11-15 DIAGNOSIS — M47894 Other spondylosis, thoracic region: Secondary | ICD-10-CM | POA: Diagnosis not present

## 2015-11-15 DIAGNOSIS — I110 Hypertensive heart disease with heart failure: Secondary | ICD-10-CM | POA: Diagnosis not present

## 2015-11-15 DIAGNOSIS — I890 Lymphedema, not elsewhere classified: Secondary | ICD-10-CM | POA: Diagnosis not present

## 2015-11-15 DIAGNOSIS — M1990 Unspecified osteoarthritis, unspecified site: Secondary | ICD-10-CM | POA: Diagnosis not present

## 2015-11-16 DIAGNOSIS — I110 Hypertensive heart disease with heart failure: Secondary | ICD-10-CM | POA: Diagnosis not present

## 2015-11-16 DIAGNOSIS — R2681 Unsteadiness on feet: Secondary | ICD-10-CM | POA: Diagnosis not present

## 2015-11-16 DIAGNOSIS — M1990 Unspecified osteoarthritis, unspecified site: Secondary | ICD-10-CM | POA: Diagnosis not present

## 2015-11-16 DIAGNOSIS — M47894 Other spondylosis, thoracic region: Secondary | ICD-10-CM | POA: Diagnosis not present

## 2015-11-16 DIAGNOSIS — I890 Lymphedema, not elsewhere classified: Secondary | ICD-10-CM | POA: Diagnosis not present

## 2015-11-16 DIAGNOSIS — I5022 Chronic systolic (congestive) heart failure: Secondary | ICD-10-CM | POA: Diagnosis not present

## 2015-11-17 ENCOUNTER — Encounter: Payer: Self-pay | Admitting: Neurology

## 2015-11-17 ENCOUNTER — Ambulatory Visit (INDEPENDENT_AMBULATORY_CARE_PROVIDER_SITE_OTHER): Payer: Medicare Other | Admitting: Neurology

## 2015-11-17 ENCOUNTER — Other Ambulatory Visit: Payer: Medicare Other

## 2015-11-17 ENCOUNTER — Telehealth: Payer: Self-pay | Admitting: *Deleted

## 2015-11-17 VITALS — BP 126/74 | HR 68 | Ht 59.0 in | Wt 146.0 lb

## 2015-11-17 DIAGNOSIS — R799 Abnormal finding of blood chemistry, unspecified: Secondary | ICD-10-CM

## 2015-11-17 DIAGNOSIS — M5030 Other cervical disc degeneration, unspecified cervical region: Secondary | ICD-10-CM | POA: Diagnosis not present

## 2015-11-17 DIAGNOSIS — N183 Chronic kidney disease, stage 3 unspecified: Secondary | ICD-10-CM

## 2015-11-17 DIAGNOSIS — G44229 Chronic tension-type headache, not intractable: Secondary | ICD-10-CM

## 2015-11-17 DIAGNOSIS — R7989 Other specified abnormal findings of blood chemistry: Secondary | ICD-10-CM

## 2015-11-17 LAB — BASIC METABOLIC PANEL WITH GFR
BUN: 32 mg/dL — ABNORMAL HIGH (ref 7–25)
CALCIUM: 9.9 mg/dL (ref 8.6–10.4)
CHLORIDE: 101 mmol/L (ref 98–110)
CO2: 29 mmol/L (ref 20–31)
CREATININE: 1.37 mg/dL — AB (ref 0.60–0.88)
GFR, Est African American: 42 mL/min — ABNORMAL LOW (ref >=60)
GFR, Est Non African American: 36 mL/min — ABNORMAL LOW (ref >=60)
GLUCOSE: 77 mg/dL (ref 65–99)
Potassium: 4.1 mmol/L (ref 3.5–5.3)
Sodium: 138 mmol/L (ref 135–146)

## 2015-11-17 MED ORDER — GABAPENTIN 100 MG PO CAPS: 100.0000 mg | ORAL_CAPSULE | Freq: Every day | ORAL | Status: DC

## 2015-11-17 NOTE — Telephone Encounter (Signed)
error 

## 2015-11-17 NOTE — Telephone Encounter (Signed)
Spoke with referral coordinator at [facility] and she stated that they did not have a referral from our office and asked that we send in another one. Placed another referral for nephrology.

## 2015-11-17 NOTE — Addendum Note (Signed)
Addended by: Loren Racer on: 11/17/2015 03:42 PM   Modules accepted: Orders

## 2015-11-17 NOTE — Progress Notes (Addendum)
NEUROLOGY CONSULTATION NOTE  [name] MRN: [id number] DOB: [birth year]  Referring provider: Dr. Lenna Gilford Primary care provider: Dr. Lenna Gilford  Reason for consult:  headache  HISTORY OF PRESENT ILLNESS: [name] is an 82 year old right-handed female with CKD stage 3, paranoid delusions, lymphedema, MGUS, hypertension, peripheral venous insufficiency, chronic systolic heart failure and asthma who presents for headache.  History obtained by patient and PCP.  Images of brain CT and MRI personally reviewed.  Labs reviewed.  She has multiple medical and psychiatric co-morbidities.  She has history of paranoid delusions, but she refuses to seek psychiatric help.  On 04/06/15, she fell, hitting her face without loss of consciousness.  She says she was tripped by her stepson.  She sustained a laceration to her eyebrow.  CT of the head and maxillofacial demonstrated left facial and scalp hematoma, without intracranial hemorrhage, skull or facial fractures.  Since then, she had been experiencing headaches.  They are left-frontal, in area where she hit her head, but it travels from the occipital region.  The left side of her face feels swollen.  There is no associated nausea, visual disturbance, or dizziness.  It is daily.  She does not take anything for it but takes tramadol daily for her back.  MRI of the brain with and without contrast performed on 06/21/15 was unremarkable.     She has history of cervical degenerative disc disease and prior surgeries.  She continues to have neck pain.  Recent MP revealed worsening renal functioning, with BUN trending up from 27 in June to 37 and Cr from 1.12 to 2.45.  Last labs were from a month ago.  She was supposed to be referred to nephrology but still has not made an appointment.  PAST MEDICAL HISTORY: Past Medical History  Diagnosis Date  . Obstructive chronic bronchitis without exacerbation ([place])   . Unspecified essential hypertension   . Unspecified  venous (peripheral) insufficiency   . Edema 2005    "since put the glass in them"   . Other abnormal glucose   . Lumbago   . Unspecified nonpsychotic mental disorder   . Unspecified venous (peripheral) insufficiency   . Unspecified vitamin D deficiency   . Anemia, unspecified   . Anxiety   . Arthritis   . Asthma   . Systolic heart failure (HCC)     EF 35 to 40% per echo January 2015  . Depression   . GERD (gastroesophageal reflux disease)     PAST SURGICAL HISTORY: Past Surgical History  Procedure Laterality Date  . Cholecystectomy    . Tonsillectomy    . Cervical spine surgery      x 2  . Partial hysterectomy    . Lumbar disc surgery    . Esophagogastroduodenoscopy N/A 04/05/2015    Procedure: ESOPHAGOGASTRODUODENOSCOPY (EGD);  Surgeon: [name], MD;  Location: [facility];  Service: Endoscopy;  Laterality: N/A;  . Flexible sigmoidoscopy N/A 04/05/2015    Procedure: FLEXIBLE SIGMOIDOSCOPY;  Surgeon: [name], MD;  Location: [facility];  Service: Endoscopy;  Laterality: N/A;    MEDICATIONS: Current Outpatient Prescriptions on File Prior to Visit  Medication Sig Dispense Refill  . albuterol (PROAIR HFA) 108 (90 BASE) MCG/ACT inhaler INHALE 2 PUFFS EVERY 4 HOURS AS NEEDED FOR WHEEZING 3 Inhaler 1  . ALPRAZolam (XANAX) 0.5 MG tablet Take 1/2 to 1 tablet by mouth 3 times a day as needed for nerves. 90 tablet 1  . ascorbic acid (VITAMIN C) 500  MG tablet Take 1,000 mg by mouth daily.     . carvedilol (COREG) 6.25 MG tablet TAKE 1 TABLET BY MOUTH TWICE DAILY 180 tablet 2  . clotrimazole-betamethasone (LOTRISONE) cream Apply 1 application topically 2 (two) times daily. To her feet 129 g 1  . Ferrous Sulfate Dried 200 (65 FE) MG TABS Take 1 tablet by mouth 2 (two) times daily. 30 tablet 2  . fluticasone-salmeterol (ADVAIR HFA) 115-21 MCG/ACT inhaler Inhale 2 puffs into the lungs 2 (two) times daily. 3 Inhaler 2  . hydrOXYzine (ATARAX/VISTARIL) 25 MG tablet Take 1 tablet  (25 mg total) by mouth every 4 (four) hours as needed. For itching 100 tablet 3  . lisinopril (PRINIVIL,ZESTRIL) 20 MG tablet Take 1 tablet (20 mg total) by mouth daily. 90 tablet 2  . ondansetron (ZOFRAN) 4 MG tablet TAKE ONE TABLET BY MOUTH THREE TIMES DAILY AS NEEDED FOR NAUSEA 30 tablet 0  . potassium chloride (KLOR-CON) 20 MEQ packet Take 40 mEq by mouth daily.    . potassium chloride SA (K-DUR,KLOR-CON) 20 MEQ tablet Take 2 tablets (40 mEq total) by mouth once. 60 tablet 11  . silver sulfADIAZINE (SILVADENE) 1 % cream Apply daily as directed 100 g 2  . THEO-24 400 MG 24 hr capsule TAKE 1 CAPSULE DAILY 90 capsule 1  . torsemide (DEMADEX) 20 MG tablet Take 2 tablets (40 mg total) by mouth daily.    . traMADol (ULTRAM) 50 MG tablet Take 1 tablet (50 mg total) by mouth 3 (three) times daily. 30 tablet 0  . triamcinolone cream (KENALOG) 0.1 % Apply 1 application topically 2 (two) times daily.  2  . Vitamin D, Ergocalciferol, (DRISDOL) 50000 UNITS CAPS capsule TAKE 1 CAPSULE BY MOUTH EVERY 7 DAYS ON SUNDAY OR MONDAY 12 capsule 1  . Vitamin D, Ergocalciferol, (DRISDOL) 50000 units CAPS capsule TAKE 1 CAPSULE BY MOUTH EVERY 7 DAYS ON SUNDAY OR MONDAY 4 capsule 0   No current facility-administered medications on file prior to visit.    ALLERGIES: Allergies  Allergen Reactions  . Aspirin Other (See Comments)    REACTION: upset stomach  . Codeine Other (See Comments)    REACTION: "MAKES MY BODY GO CRAZY; CRAMPS"  . Latex Nausea And Vomiting    FAMILY HISTORY: Family History  Problem Relation Age of Onset  . Diabetes Mother   . Diabetes Paternal Grandfather     entire family on both sides  . Heart disease Maternal Aunt     entire family of both sides  . Cancer Paternal Aunt     type unknown    SOCIAL HISTORY: Social History   Social History  . Marital Status: Married    Spouse Name: N/A  . Number of Children: 0  . Years of Education: N/A   Occupational History  . retired     Social History Main Topics  . Smoking status: Never Smoker   . Smokeless tobacco: Never Used  . Alcohol Use: No  . Drug Use: No  . Sexual Activity: Not Currently   Other Topics Concern  . Not on file   Social History Narrative   2 years of college    REVIEW OF SYSTEMS: Constitutional: No fevers, chills, or sweats, no generalized fatigue, change in appetite Eyes: No visual changes, double vision, eye pain Ear, nose and throat: No hearing loss, ear pain, nasal congestion, sore throat Cardiovascular: No chest pain, palpitations Respiratory:  No shortness of breath at rest or with exertion, wheezes GastrointestinaI: No  nausea, vomiting, diarrhea, abdominal pain, fecal incontinence Genitourinary:  No dysuria, urinary retention or frequency Musculoskeletal:  Neck pain, back pain Integumentary: bilateral lymphadema in feet Neurological: as above Psychiatric: No depression, insomnia, anxiety Endocrine: No palpitations, fatigue, diaphoresis, mood swings, change in appetite, change in weight, increased thirst Hematologic/Lymphatic:  No anemia, purpura, petechiae. Allergic/Immunologic: no itchy/runny eyes, nasal congestion, recent allergic reactions, rashes  PHYSICAL EXAM: Filed Vitals:   11/17/15 0926  BP: 126/74  Pulse: 68   General: No acute distress.  Patient appears well. Head:  Normocephalic/atraumatic Eyes:  fundi unremarkable, without vessel changes, exudates, hemorrhages or papilledema. Neck: supple, no paraspinal tenderness, full range of motion Back: bilateral paraspinal tenderness, left suboccipital tenderness Heart: regular rate and rhythm Lungs: Clear to auscultation bilaterally. Vascular: No carotid bruits. Neurological Exam: Mental status: alert and oriented to person, place, and time, recent and remote memory intact, fund of knowledge intact, attention and concentration intact, speech fluent and not dysarthric, language intact. Cranial nerves: CN I: not  tested CN II: pupils equal, round and reactive to light, visual fields intact, fundi unremarkable, without vessel changes, exudates, hemorrhages or papilledema. CN III, IV, VI:  full range of motion, no nystagmus, no ptosis CN V: facial sensation intact CN VII: upper and lower face symmetric CN VIII: hearing intact CN IX, X: gag intact, uvula midline CN XI: sternocleidomastoid and trapezius muscles intact CN XII: tongue midline Bulk & Tone: normal, no fasciculations. Motor:  Limited effort due to generalized discomfort but at least 4/5 Sensation:  Pinprick and vibration sensation intact. Deep Tendon Reflexes:  2+ throughout, toes downgoing.  Finger to nose testing:  Without dysmetria.  Heel to shin:  Without dysmetria.  Gait:  Antalgic gait, requiring cane.  Able to turn but cannot tandem walk. Romberg with sway.  IMPRESSION: Tension type headache, chronic, probably contributing from neck  CKD  PLAN: 1.  Will start gabapentin 100mg  at bedtime.  I do not want to increase further pending updated renal function 2.  Will check a BMP today (ADDENDMUM:  BUN 32, Cr 1.37, GFR 42) but advised her that she contact PCP or cardiology (who made referral) immediately for referral to nephrology 3.  Advised to limit tramadol to no more than 2 days out of the week if possible 4.  Follow up in 3 months.  Thank you for allowing me to take part in the care of this patient.  Metta Clines, DO  CC:  Teressa Lower, MD  Maude Leriche, NP

## 2015-11-17 NOTE — Telephone Encounter (Signed)
Message     Please make sure pt is to see nephrology- she has not yet been seen. Thanks.    Left message for referral coordinator at [facility], Dr. Ulice Dash Patel's office to follow up on referral sent in on 10/13/15. Will await return call.

## 2015-11-17 NOTE — Patient Instructions (Signed)
I would like to start a medication called gabapentin.  However, your kidney function as been getting worse.  We will start a low dose 100mg  at bedtime.  We will check blood work but you need to contact your cardiologist or Dr. Lenna Gilford regarding referral to see a kidney doctor Call in 4 weeks with update Follow up in 3 months.

## 2015-11-19 DIAGNOSIS — R2681 Unsteadiness on feet: Secondary | ICD-10-CM | POA: Diagnosis not present

## 2015-11-19 DIAGNOSIS — I890 Lymphedema, not elsewhere classified: Secondary | ICD-10-CM | POA: Diagnosis not present

## 2015-11-19 DIAGNOSIS — I5022 Chronic systolic (congestive) heart failure: Secondary | ICD-10-CM | POA: Diagnosis not present

## 2015-11-19 DIAGNOSIS — M1990 Unspecified osteoarthritis, unspecified site: Secondary | ICD-10-CM | POA: Diagnosis not present

## 2015-11-19 DIAGNOSIS — M47894 Other spondylosis, thoracic region: Secondary | ICD-10-CM | POA: Diagnosis not present

## 2015-11-19 DIAGNOSIS — I110 Hypertensive heart disease with heart failure: Secondary | ICD-10-CM | POA: Diagnosis not present

## 2015-11-23 DIAGNOSIS — M1990 Unspecified osteoarthritis, unspecified site: Secondary | ICD-10-CM | POA: Diagnosis not present

## 2015-11-23 DIAGNOSIS — I890 Lymphedema, not elsewhere classified: Secondary | ICD-10-CM | POA: Diagnosis not present

## 2015-11-23 DIAGNOSIS — R2681 Unsteadiness on feet: Secondary | ICD-10-CM | POA: Diagnosis not present

## 2015-11-23 DIAGNOSIS — I110 Hypertensive heart disease with heart failure: Secondary | ICD-10-CM | POA: Diagnosis not present

## 2015-11-23 DIAGNOSIS — I5022 Chronic systolic (congestive) heart failure: Secondary | ICD-10-CM | POA: Diagnosis not present

## 2015-11-23 DIAGNOSIS — M47894 Other spondylosis, thoracic region: Secondary | ICD-10-CM | POA: Diagnosis not present

## 2015-11-23 NOTE — Telephone Encounter (Signed)
Neoma Laming from our check out team spoke with me and informed me that she spoke with Marzetta Board at [facility] and they have rated pt at a #2 which means they would like to get her in ASAP. Marzetta Board states that prior to pt being seen in their office she would need to stop her Lisinopril. Will route to Cecilie Kicks, NP for review and advisement.

## 2015-11-23 NOTE — Telephone Encounter (Signed)
Have pt stop lisinopril and begin hydralazine 10 mg 3 times a day.

## 2015-11-24 DIAGNOSIS — I10 Essential (primary) hypertension: Secondary | ICD-10-CM | POA: Diagnosis not present

## 2015-11-24 DIAGNOSIS — N183 Chronic kidney disease, stage 3 (moderate): Secondary | ICD-10-CM | POA: Diagnosis not present

## 2015-11-24 DIAGNOSIS — D638 Anemia in other chronic diseases classified elsewhere: Secondary | ICD-10-CM | POA: Diagnosis not present

## 2015-11-24 DIAGNOSIS — N2581 Secondary hyperparathyroidism of renal origin: Secondary | ICD-10-CM | POA: Diagnosis not present

## 2015-11-24 NOTE — Telephone Encounter (Signed)
Attempted to call pt.  Phone rang several times with no answer and no voicemail option.  Will try again later.

## 2015-11-25 ENCOUNTER — Telehealth: Payer: Self-pay | Admitting: Cardiology

## 2015-11-25 MED ORDER — HYDRALAZINE HCL 10 MG PO TABS: 10.0000 mg | ORAL_TABLET | Freq: Three times a day (TID) | ORAL | Status: DC

## 2015-11-25 NOTE — Telephone Encounter (Signed)
Vashti Hey from our check out team informed me that she spoke with Stacy from [facility] in regards to medication changes we made for pt.  We stopped Lisinopril and started Hydralazine 10mg  TID.  Marzetta Board states that Dr. Posey Pronto wanted pt to take Hydralazine 25mg  BID instead.  I called and spoke with Marzetta Board and she put me through to Dr. Serita Grit covering Nurse or CMA, [facility].  Left message for her to call back for clarification.  Attempted to call pt to have her hold off on picking medication up but there was no answer.

## 2015-11-25 NOTE — Telephone Encounter (Signed)
Spoke with pt and advised her of medication changes.  Pt verbalized understanding and was in agreement with this plan.  Pt states that she was seen at [facility] yesterday.

## 2015-11-25 NOTE — Addendum Note (Signed)
Addended by: Loren Racer on: 11/25/2015 09:02 AM   Modules accepted: Orders, Medications

## 2015-11-26 ENCOUNTER — Encounter: Payer: Self-pay | Admitting: Internal Medicine

## 2015-11-26 ENCOUNTER — Encounter (HOSPITAL_BASED_OUTPATIENT_CLINIC_OR_DEPARTMENT_OTHER): Payer: Medicare Other | Attending: Internal Medicine

## 2015-11-26 DIAGNOSIS — R2681 Unsteadiness on feet: Secondary | ICD-10-CM | POA: Diagnosis not present

## 2015-11-26 DIAGNOSIS — J449 Chronic obstructive pulmonary disease, unspecified: Secondary | ICD-10-CM | POA: Insufficient documentation

## 2015-11-26 DIAGNOSIS — I130 Hypertensive heart and chronic kidney disease with heart failure and stage 1 through stage 4 chronic kidney disease, or unspecified chronic kidney disease: Secondary | ICD-10-CM | POA: Diagnosis not present

## 2015-11-26 DIAGNOSIS — I5022 Chronic systolic (congestive) heart failure: Secondary | ICD-10-CM | POA: Insufficient documentation

## 2015-11-26 DIAGNOSIS — I890 Lymphedema, not elsewhere classified: Secondary | ICD-10-CM | POA: Diagnosis present

## 2015-11-26 DIAGNOSIS — I87323 Chronic venous hypertension (idiopathic) with inflammation of bilateral lower extremity: Secondary | ICD-10-CM | POA: Insufficient documentation

## 2015-11-26 DIAGNOSIS — N183 Chronic kidney disease, stage 3 (moderate): Secondary | ICD-10-CM | POA: Insufficient documentation

## 2015-11-26 DIAGNOSIS — I83893 Varicose veins of bilateral lower extremities with other complications: Secondary | ICD-10-CM | POA: Diagnosis not present

## 2015-11-26 DIAGNOSIS — M47894 Other spondylosis, thoracic region: Secondary | ICD-10-CM | POA: Diagnosis not present

## 2015-11-26 DIAGNOSIS — I110 Hypertensive heart disease with heart failure: Secondary | ICD-10-CM | POA: Diagnosis not present

## 2015-11-26 DIAGNOSIS — M1990 Unspecified osteoarthritis, unspecified site: Secondary | ICD-10-CM | POA: Diagnosis not present

## 2015-11-27 DIAGNOSIS — M1990 Unspecified osteoarthritis, unspecified site: Secondary | ICD-10-CM | POA: Diagnosis not present

## 2015-11-27 DIAGNOSIS — I5022 Chronic systolic (congestive) heart failure: Secondary | ICD-10-CM | POA: Diagnosis not present

## 2015-11-27 DIAGNOSIS — R2681 Unsteadiness on feet: Secondary | ICD-10-CM | POA: Diagnosis not present

## 2015-11-27 DIAGNOSIS — I890 Lymphedema, not elsewhere classified: Secondary | ICD-10-CM | POA: Diagnosis not present

## 2015-11-27 DIAGNOSIS — M47894 Other spondylosis, thoracic region: Secondary | ICD-10-CM | POA: Diagnosis not present

## 2015-11-27 DIAGNOSIS — I110 Hypertensive heart disease with heart failure: Secondary | ICD-10-CM | POA: Diagnosis not present

## 2015-11-29 DIAGNOSIS — I110 Hypertensive heart disease with heart failure: Secondary | ICD-10-CM | POA: Diagnosis not present

## 2015-11-29 DIAGNOSIS — M1990 Unspecified osteoarthritis, unspecified site: Secondary | ICD-10-CM | POA: Diagnosis not present

## 2015-11-29 DIAGNOSIS — R2681 Unsteadiness on feet: Secondary | ICD-10-CM | POA: Diagnosis not present

## 2015-11-29 DIAGNOSIS — I5022 Chronic systolic (congestive) heart failure: Secondary | ICD-10-CM | POA: Diagnosis not present

## 2015-11-29 DIAGNOSIS — I890 Lymphedema, not elsewhere classified: Secondary | ICD-10-CM | POA: Diagnosis not present

## 2015-11-29 DIAGNOSIS — M47894 Other spondylosis, thoracic region: Secondary | ICD-10-CM | POA: Diagnosis not present

## 2015-11-30 DIAGNOSIS — I890 Lymphedema, not elsewhere classified: Secondary | ICD-10-CM | POA: Diagnosis not present

## 2015-11-30 DIAGNOSIS — M47894 Other spondylosis, thoracic region: Secondary | ICD-10-CM | POA: Diagnosis not present

## 2015-11-30 DIAGNOSIS — I110 Hypertensive heart disease with heart failure: Secondary | ICD-10-CM | POA: Diagnosis not present

## 2015-11-30 DIAGNOSIS — I5022 Chronic systolic (congestive) heart failure: Secondary | ICD-10-CM | POA: Diagnosis not present

## 2015-11-30 DIAGNOSIS — R2681 Unsteadiness on feet: Secondary | ICD-10-CM | POA: Diagnosis not present

## 2015-11-30 DIAGNOSIS — M1990 Unspecified osteoarthritis, unspecified site: Secondary | ICD-10-CM | POA: Diagnosis not present

## 2015-11-30 NOTE — Telephone Encounter (Signed)
Will route to Cecilie Kicks, NP for review and advisement on dose Dr. Posey Pronto is requesting.

## 2015-12-02 ENCOUNTER — Other Ambulatory Visit: Payer: Self-pay | Admitting: Nephrology

## 2015-12-02 DIAGNOSIS — N183 Chronic kidney disease, stage 3 unspecified: Secondary | ICD-10-CM

## 2015-12-02 DIAGNOSIS — I110 Hypertensive heart disease with heart failure: Secondary | ICD-10-CM | POA: Diagnosis not present

## 2015-12-02 DIAGNOSIS — I890 Lymphedema, not elsewhere classified: Secondary | ICD-10-CM | POA: Diagnosis not present

## 2015-12-02 DIAGNOSIS — M47894 Other spondylosis, thoracic region: Secondary | ICD-10-CM | POA: Diagnosis not present

## 2015-12-02 DIAGNOSIS — I5022 Chronic systolic (congestive) heart failure: Secondary | ICD-10-CM | POA: Diagnosis not present

## 2015-12-02 DIAGNOSIS — M1990 Unspecified osteoarthritis, unspecified site: Secondary | ICD-10-CM | POA: Diagnosis not present

## 2015-12-02 DIAGNOSIS — R2681 Unsteadiness on feet: Secondary | ICD-10-CM | POA: Diagnosis not present

## 2015-12-03 ENCOUNTER — Ambulatory Visit (INDEPENDENT_AMBULATORY_CARE_PROVIDER_SITE_OTHER): Payer: Medicare Other | Admitting: Cardiology

## 2015-12-03 ENCOUNTER — Encounter: Payer: Self-pay | Admitting: Cardiology

## 2015-12-03 ENCOUNTER — Ambulatory Visit (HOSPITAL_BASED_OUTPATIENT_CLINIC_OR_DEPARTMENT_OTHER): Payer: Medicare Other

## 2015-12-03 VITALS — BP 132/72 | HR 62 | Ht 59.0 in | Wt 147.8 lb

## 2015-12-03 DIAGNOSIS — N183 Chronic kidney disease, stage 3 unspecified: Secondary | ICD-10-CM

## 2015-12-03 DIAGNOSIS — R748 Abnormal levels of other serum enzymes: Secondary | ICD-10-CM

## 2015-12-03 DIAGNOSIS — I10 Essential (primary) hypertension: Secondary | ICD-10-CM

## 2015-12-03 DIAGNOSIS — I890 Lymphedema, not elsewhere classified: Secondary | ICD-10-CM | POA: Diagnosis not present

## 2015-12-03 DIAGNOSIS — R0789 Other chest pain: Secondary | ICD-10-CM

## 2015-12-03 DIAGNOSIS — I5022 Chronic systolic (congestive) heart failure: Secondary | ICD-10-CM

## 2015-12-03 DIAGNOSIS — Z79899 Other long term (current) drug therapy: Secondary | ICD-10-CM

## 2015-12-03 DIAGNOSIS — R42 Dizziness and giddiness: Secondary | ICD-10-CM

## 2015-12-03 DIAGNOSIS — R7989 Other specified abnormal findings of blood chemistry: Secondary | ICD-10-CM

## 2015-12-03 LAB — BASIC METABOLIC PANEL
BUN: 51 mg/dL — ABNORMAL HIGH (ref 7–25)
CHLORIDE: 101 mmol/L (ref 98–110)
CO2: 25 mmol/L (ref 20–31)
CREATININE: 1.97 mg/dL — AB (ref 0.60–0.88)
Calcium: 10 mg/dL (ref 8.6–10.4)
Glucose, Bld: 93 mg/dL (ref 65–99)
Potassium: 4 mmol/L (ref 3.5–5.3)
Sodium: 139 mmol/L (ref 135–146)

## 2015-12-03 LAB — MAGNESIUM: Magnesium: 2.3 mg/dL (ref 1.5–2.5)

## 2015-12-03 MED ORDER — HYDRALAZINE HCL 25 MG PO TABS: 25.0000 mg | ORAL_TABLET | Freq: Two times a day (BID) | ORAL | Status: DC

## 2015-12-03 NOTE — Patient Instructions (Addendum)
Medication Instructions:  Please change Hydralazine 25 mg twice a day. Continue all other medications as listed.  Please have blood work today (BMP,Mg)  Follow-Up: Follow up as scheduled with Dr [name].  If you need a refill on your cardiac medications before your next appointment, please call your pharmacy.  Thank you for choosing [facility]!!

## 2015-12-03 NOTE — Telephone Encounter (Signed)
Spoke with Cecilie Kicks, NP and she said ok to change to Hydralazine to 25mg  BID. Spoke with pt and informed her of new prescription. Pt verbalized understanding and was in agreement. While speaking with pt she stated that for the last 2 days she has been very dizzy, experienced headaches and some facial swelling. Pt states that she also has a fullness in her chest but denies pain or pressure. Pt states that she does still have edema in her legs but not as bad as she use to. Pt states that therapist was in to see her yesterday and took BP and it was ???/100, then ???/95. Offered to have pt come in today and she states that she doesn't know if she can get a ride. Advised pt to try to find a ride and call me if she does so I could get her scheduled. Pt verbalized understanding and was in agreement with this plan. Spoke with Cecilie Kicks, NP and she said ok to schedule but if pt unable to get a ride she should call 911 and proceed to hospital.

## 2015-12-03 NOTE — Telephone Encounter (Signed)
Called and spoke with pt and she states that she has called her nephew and she is just waiting for him to call her back to see if she is able to get here for the 3pm appt.  Advised pt to call me as soon as she hears from him so that I can cancel appt if she cant come.

## 2015-12-03 NOTE — Progress Notes (Signed)
Cardiology Office Note   Date:  12/03/2015   ID:  [name], DOB 1931-11-17, MRN [id number]  PCP:  [name], MD  Cardiologist:  Dr. [name]    Chief Complaint  Patient presents with  . Dizziness  . elevated bp  . chest heaviness  . Chest Pain      History of Present Illness: [name] is a 82 y.o. female who presents for HTN with recent changes stopping ACE and adding hydralazine now with increased dose per renal. She was seen by nephrology and with stopping ACE cr went from 2.45 to 1.37.    Today she complained about being dizzy and having headaches and some facial swelling. This is from where she hit her head several months ago.   She also complained of some chest fullness but it is needles and pins that last seconds.  No SOB, some wheezes at times when she needs inhaler.  Her BP has been elevated as well. She has not increased her hydralazine yet.    She has a history of venous insufficiency, COPD, hypertension, and chronic systolic heart failure with EF 35-40% (2015) with subsequent perfusion study with EF of 50%. Significant lymphedema, currently better.  She has been seen by lymphedema clinic in [place] also seen by wound care.    Since seen here her ACE has been stopped due to Cr 2.45 acute renal failure but cr. Now improved.  On hydralazine for BP control nephrology recommended increasing to 25 mg BID.    Past Medical History  Diagnosis Date  . Obstructive chronic bronchitis without exacerbation ([place])   . Unspecified essential hypertension   . Unspecified venous (peripheral) insufficiency   . Edema 2005    "since put the glass in them"   . Other abnormal glucose   . Lumbago   . Unspecified nonpsychotic mental disorder   . Unspecified venous (peripheral) insufficiency   . Unspecified vitamin D deficiency   . Anemia, unspecified   . Anxiety   . Arthritis   . Asthma   . Systolic heart failure (HCC)     EF 35 to 40% per echo January 2015  .  Depression   . GERD (gastroesophageal reflux disease)     Past Surgical History  Procedure Laterality Date  . Cholecystectomy    . Tonsillectomy    . Cervical spine surgery      x 2  . Partial hysterectomy    . Lumbar disc surgery    . Esophagogastroduodenoscopy N/A 04/05/2015    Procedure: ESOPHAGOGASTRODUODENOSCOPY (EGD);  Surgeon: [name], MD;  Location: [facility];  Service: Endoscopy;  Laterality: N/A;  . Flexible sigmoidoscopy N/A 04/05/2015    Procedure: FLEXIBLE SIGMOIDOSCOPY;  Surgeon: [name], MD;  Location: [facility];  Service: Endoscopy;  Laterality: N/A;     Current Outpatient Prescriptions  Medication Sig Dispense Refill  . albuterol (PROAIR HFA) 108 (90 BASE) MCG/ACT inhaler INHALE 2 PUFFS EVERY 4 HOURS AS NEEDED FOR WHEEZING 3 Inhaler 1  . ALPRAZolam (XANAX) 0.5 MG tablet Take 1/2 to 1 tablet by mouth 3 times a day as needed for nerves. 90 tablet 1  . ascorbic acid (VITAMIN C) 500 MG tablet Take 1,000 mg by mouth daily.     . carvedilol (COREG) 6.25 MG tablet TAKE 1 TABLET BY MOUTH TWICE DAILY 180 tablet 2  . clotrimazole-betamethasone (LOTRISONE) cream Apply 1 application topically 2 (two) times daily. To her feet 129 g 1  . Ferrous Sulfate  Dried 200 (65 FE) MG TABS Take 1 tablet by mouth 2 (two) times daily. 30 tablet 2  . fluticasone-salmeterol (ADVAIR HFA) 115-21 MCG/ACT inhaler Inhale 2 puffs into the lungs 2 (two) times daily. 3 Inhaler 2  . gabapentin (NEURONTIN) 100 MG capsule Take 1 capsule (100 mg total) by mouth at bedtime. 30 capsule 2  . hydrALAZINE (APRESOLINE) 25 MG tablet Take 1 tablet (25 mg total) by mouth 2 (two) times daily. 180 tablet 3  . hydrOXYzine (ATARAX/VISTARIL) 25 MG tablet Take 1 tablet (25 mg total) by mouth every 4 (four) hours as needed. For itching 100 tablet 3  . ondansetron (ZOFRAN) 4 MG tablet TAKE ONE TABLET BY MOUTH THREE TIMES DAILY AS NEEDED FOR NAUSEA 30 tablet 0  . potassium chloride (KLOR-CON) 20 MEQ packet  Take 40 mEq by mouth daily.    . potassium chloride SA (K-DUR,KLOR-CON) 20 MEQ tablet Take 2 tablets (40 mEq total) by mouth once. 60 tablet 11  . silver sulfADIAZINE (SILVADENE) 1 % cream Apply daily as directed 100 g 2  . THEO-24 400 MG 24 hr capsule TAKE 1 CAPSULE DAILY 90 capsule 1  . torsemide (DEMADEX) 20 MG tablet Take 2 tablets (40 mg total) by mouth daily.    . traMADol (ULTRAM) 50 MG tablet Take 1 tablet (50 mg total) by mouth 3 (three) times daily. 30 tablet 0  . triamcinolone cream (KENALOG) 0.1 % Apply 1 application topically 2 (two) times daily.  2  . Vitamin D, Ergocalciferol, (DRISDOL) 50000 UNITS CAPS capsule TAKE 1 CAPSULE BY MOUTH EVERY 7 DAYS ON SUNDAY OR MONDAY 12 capsule 1  . Vitamin D, Ergocalciferol, (DRISDOL) 50000 units CAPS capsule TAKE 1 CAPSULE BY MOUTH EVERY 7 DAYS ON SUNDAY OR MONDAY 4 capsule 0   No current facility-administered medications for this visit.    Allergies:   Aspirin; Codeine; and Latex    Social History:  The patient  reports that she has never smoked. She has never used smokeless tobacco. She reports that she does not drink alcohol or use illicit drugs.   Family History:  The patient's family history includes Cancer in her paternal aunt; Diabetes in her mother and paternal grandfather; Heart disease in her maternal aunt.    ROS:  General:no colds or fevers, weight stable Skin:no rashes or ulcers HEENT:no blurred vision, no congestion CV:see HPI PUL:see HPI GI:no diarrhea constipation or melena, no indigestion GU:no hematuria, no dysuria MS:no joint pain, no claudication, legs improved Neuro:no syncope, no lightheadedness Endo:no diabetes, no thyroid disease  Wt Readings from Last 3 Encounters:  11/17/15 146 lb (66.225 kg)  10/05/15 152 lb 12.8 oz (69.31 kg)  08/31/15 145 lb 12.8 oz (66.134 kg)     PHYSICAL EXAM: VS:  Ht 4\' 11"  (1.499 m) , BMI There is no weight on file to calculate BMI. General:Pleasant affect, NAD Skin:Warm and  dry, brisk capillary refill HEENT:normocephalic, sclera clear, mucus membranes moist Neck:supple, no JVD, no bruits  Heart:S1S2 RRR without murmur, gallup, rub or click Lungs:clear without rales, rhonchi, or wheezes [id number], non tender, + BS, do not palpate liver spleen or masses [id number] improved lower ext edema, no oozing of legs at this visit.   2+ radial pulses Neuro:alert and oriented X 3, MAE, follows commands, + facial symmetry    EKG:  EKG is ordered today. The ekg ordered today demonstrates SR no acute changes.   Recent Labs: 02/11/2015: Pro B Natriuretic peptide (BNP) 262.0* 04/03/2015: ALT 23 04/19/2015: Hemoglobin 11.5*; Platelets  370.0 11/17/2015: BUN 32*; Creat 1.37*; Potassium 4.1; Sodium 138    Lipid Panel    Component Value Date/Time   CHOL 182 01/28/2010 1023   TRIG 75.0 01/28/2010 1023   HDL 57.90 01/28/2010 1023   CHOLHDL 3 01/28/2010 1023   VLDL 15.0 01/28/2010 1023   LDLCALC 109* 01/28/2010 1023       Other studies Reviewed: Additional studies/ records that were reviewed today include: previous .   ASSESSMENT AND PLAN:  1.  DIzzy with elevated BP yesterday increase hydralazine to 25 mg BID.    2. Chest discomfort short brief pinching episodes may be due to HTN.  Her EKG is stable and her last stress test without ischemia.   3. HTN see above, check BMP and mag.  She complains of leg cramps at rest.  4.  CKD with Cr up to 2.45, now back to 1.37.  Will recheck today  5. Lymphedema improved to follow up with clinic  6. Headaches followed by neurology.   She has follow up with Dr. [name] in March.   Current medicines are reviewed with the patient today.  The patient Has no concerns regarding medicines.  The following changes have been made:  See above Labs/ tests ordered today include:see above  Disposition:   FU:  see above  Signed, Isaiah Serge, NP  12/03/2015 3:25 PM    [facility] [place], [place],  [address] [place], [place] Phone: [phone]; Fax: [phone]

## 2015-12-03 NOTE — Telephone Encounter (Signed)
Spoke with pt and she states that she will be here for her 3pm appt.

## 2015-12-06 DIAGNOSIS — I890 Lymphedema, not elsewhere classified: Secondary | ICD-10-CM | POA: Diagnosis not present

## 2015-12-06 DIAGNOSIS — I110 Hypertensive heart disease with heart failure: Secondary | ICD-10-CM | POA: Diagnosis not present

## 2015-12-06 DIAGNOSIS — R2681 Unsteadiness on feet: Secondary | ICD-10-CM | POA: Diagnosis not present

## 2015-12-06 DIAGNOSIS — M47894 Other spondylosis, thoracic region: Secondary | ICD-10-CM | POA: Diagnosis not present

## 2015-12-06 DIAGNOSIS — I5022 Chronic systolic (congestive) heart failure: Secondary | ICD-10-CM | POA: Diagnosis not present

## 2015-12-06 DIAGNOSIS — M1990 Unspecified osteoarthritis, unspecified site: Secondary | ICD-10-CM | POA: Diagnosis not present

## 2015-12-07 DIAGNOSIS — I110 Hypertensive heart disease with heart failure: Secondary | ICD-10-CM | POA: Diagnosis not present

## 2015-12-07 DIAGNOSIS — I890 Lymphedema, not elsewhere classified: Secondary | ICD-10-CM | POA: Diagnosis not present

## 2015-12-07 DIAGNOSIS — M47894 Other spondylosis, thoracic region: Secondary | ICD-10-CM | POA: Diagnosis not present

## 2015-12-07 DIAGNOSIS — R2681 Unsteadiness on feet: Secondary | ICD-10-CM | POA: Diagnosis not present

## 2015-12-07 DIAGNOSIS — M1990 Unspecified osteoarthritis, unspecified site: Secondary | ICD-10-CM | POA: Diagnosis not present

## 2015-12-07 DIAGNOSIS — I5022 Chronic systolic (congestive) heart failure: Secondary | ICD-10-CM | POA: Diagnosis not present

## 2015-12-08 ENCOUNTER — Ambulatory Visit: Payer: Medicare Other | Admitting: Internal Medicine

## 2015-12-09 ENCOUNTER — Other Ambulatory Visit: Payer: Medicare Other

## 2015-12-10 DIAGNOSIS — R2681 Unsteadiness on feet: Secondary | ICD-10-CM | POA: Diagnosis not present

## 2015-12-10 DIAGNOSIS — I5022 Chronic systolic (congestive) heart failure: Secondary | ICD-10-CM | POA: Diagnosis not present

## 2015-12-10 DIAGNOSIS — I110 Hypertensive heart disease with heart failure: Secondary | ICD-10-CM | POA: Diagnosis not present

## 2015-12-10 DIAGNOSIS — M47894 Other spondylosis, thoracic region: Secondary | ICD-10-CM | POA: Diagnosis not present

## 2015-12-10 DIAGNOSIS — I890 Lymphedema, not elsewhere classified: Secondary | ICD-10-CM | POA: Diagnosis not present

## 2015-12-10 DIAGNOSIS — M1990 Unspecified osteoarthritis, unspecified site: Secondary | ICD-10-CM | POA: Diagnosis not present

## 2015-12-14 DIAGNOSIS — M1990 Unspecified osteoarthritis, unspecified site: Secondary | ICD-10-CM | POA: Diagnosis not present

## 2015-12-14 DIAGNOSIS — I110 Hypertensive heart disease with heart failure: Secondary | ICD-10-CM | POA: Diagnosis not present

## 2015-12-14 DIAGNOSIS — I5022 Chronic systolic (congestive) heart failure: Secondary | ICD-10-CM | POA: Diagnosis not present

## 2015-12-14 DIAGNOSIS — M47894 Other spondylosis, thoracic region: Secondary | ICD-10-CM | POA: Diagnosis not present

## 2015-12-14 DIAGNOSIS — I890 Lymphedema, not elsewhere classified: Secondary | ICD-10-CM | POA: Diagnosis not present

## 2015-12-14 DIAGNOSIS — R2681 Unsteadiness on feet: Secondary | ICD-10-CM | POA: Diagnosis not present

## 2015-12-15 ENCOUNTER — Ambulatory Visit
Admission: RE | Admit: 2015-12-15 | Discharge: 2015-12-15 | Disposition: A | Discharge status: Home / Self Care | Payer: Medicare Other | Source: Ambulatory Visit | Attending: Nephrology | Admitting: Nephrology

## 2015-12-15 DIAGNOSIS — N183 Chronic kidney disease, stage 3 unspecified: Secondary | ICD-10-CM

## 2015-12-21 DIAGNOSIS — I890 Lymphedema, not elsewhere classified: Secondary | ICD-10-CM | POA: Diagnosis not present

## 2015-12-21 DIAGNOSIS — I110 Hypertensive heart disease with heart failure: Secondary | ICD-10-CM | POA: Diagnosis not present

## 2015-12-21 DIAGNOSIS — I5022 Chronic systolic (congestive) heart failure: Secondary | ICD-10-CM | POA: Diagnosis not present

## 2015-12-21 DIAGNOSIS — R2681 Unsteadiness on feet: Secondary | ICD-10-CM | POA: Diagnosis not present

## 2015-12-21 DIAGNOSIS — M47894 Other spondylosis, thoracic region: Secondary | ICD-10-CM | POA: Diagnosis not present

## 2015-12-21 DIAGNOSIS — M1990 Unspecified osteoarthritis, unspecified site: Secondary | ICD-10-CM | POA: Diagnosis not present

## 2015-12-24 ENCOUNTER — Other Ambulatory Visit: Payer: Self-pay | Admitting: Pulmonary Disease

## 2015-12-24 ENCOUNTER — Ambulatory Visit: Payer: Medicare Other | Admitting: Occupational Therapy

## 2015-12-28 DIAGNOSIS — I890 Lymphedema, not elsewhere classified: Secondary | ICD-10-CM | POA: Diagnosis not present

## 2015-12-28 DIAGNOSIS — M1990 Unspecified osteoarthritis, unspecified site: Secondary | ICD-10-CM | POA: Diagnosis not present

## 2015-12-28 DIAGNOSIS — R2681 Unsteadiness on feet: Secondary | ICD-10-CM | POA: Diagnosis not present

## 2015-12-28 DIAGNOSIS — I5022 Chronic systolic (congestive) heart failure: Secondary | ICD-10-CM | POA: Diagnosis not present

## 2015-12-28 DIAGNOSIS — M47894 Other spondylosis, thoracic region: Secondary | ICD-10-CM | POA: Diagnosis not present

## 2015-12-28 DIAGNOSIS — I110 Hypertensive heart disease with heart failure: Secondary | ICD-10-CM | POA: Diagnosis not present

## 2015-12-29 NOTE — Telephone Encounter (Signed)
Electronic refill request for VitD 50K, Tramadol 50mg , Zofran 4mg  Last ov 10.14.16 w/ SN SN is still pt's PCP  VitD last refilled 1.3.17 Tramadol last refilled 6.7.16 #30, 0 refills by ED physician  Zofran last refill 11.20.16 #30, 0 refills  VitD and Zofran sent electronically Tramadol telephoned to pharmacy

## 2015-12-30 ENCOUNTER — Encounter (HOSPITAL_BASED_OUTPATIENT_CLINIC_OR_DEPARTMENT_OTHER): Payer: Medicare Other | Attending: Internal Medicine

## 2015-12-30 DIAGNOSIS — I87323 Chronic venous hypertension (idiopathic) with inflammation of bilateral lower extremity: Secondary | ICD-10-CM | POA: Diagnosis not present

## 2015-12-30 DIAGNOSIS — I890 Lymphedema, not elsewhere classified: Secondary | ICD-10-CM | POA: Insufficient documentation

## 2015-12-30 DIAGNOSIS — I83893 Varicose veins of bilateral lower extremities with other complications: Secondary | ICD-10-CM | POA: Diagnosis not present

## 2015-12-31 DIAGNOSIS — I110 Hypertensive heart disease with heart failure: Secondary | ICD-10-CM | POA: Diagnosis not present

## 2015-12-31 DIAGNOSIS — M47894 Other spondylosis, thoracic region: Secondary | ICD-10-CM | POA: Diagnosis not present

## 2015-12-31 DIAGNOSIS — M1990 Unspecified osteoarthritis, unspecified site: Secondary | ICD-10-CM | POA: Diagnosis not present

## 2015-12-31 DIAGNOSIS — I5022 Chronic systolic (congestive) heart failure: Secondary | ICD-10-CM | POA: Diagnosis not present

## 2015-12-31 DIAGNOSIS — I890 Lymphedema, not elsewhere classified: Secondary | ICD-10-CM | POA: Diagnosis not present

## 2015-12-31 DIAGNOSIS — R2681 Unsteadiness on feet: Secondary | ICD-10-CM | POA: Diagnosis not present

## 2016-01-05 ENCOUNTER — Ambulatory Visit: Payer: Medicare Other | Attending: Cardiology | Admitting: Occupational Therapy

## 2016-01-05 ENCOUNTER — Encounter: Payer: Self-pay | Admitting: Occupational Therapy

## 2016-01-05 DIAGNOSIS — I890 Lymphedema, not elsewhere classified: Secondary | ICD-10-CM | POA: Insufficient documentation

## 2016-01-07 NOTE — Patient Instructions (Signed)
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
## 2016-01-10 ENCOUNTER — Encounter: Payer: Self-pay | Admitting: Internal Medicine

## 2016-01-11 ENCOUNTER — Ambulatory Visit: Payer: Medicare Other | Admitting: Interventional Cardiology

## 2016-01-18 ENCOUNTER — Encounter: Payer: Medicare Other | Admitting: Occupational Therapy

## 2016-01-20 ENCOUNTER — Ambulatory Visit: Payer: Medicare Other | Admitting: Occupational Therapy

## 2016-01-20 NOTE — Progress Notes (Signed)
Cardiology Office Note:    Date:  01/20/2016   ID:  JOELENE GIEL, DOB 03/04/1933, MRN [id number]  PCP:  Gildardo Cranker, DO  Cardiologist:  Dr. Daneen Schick   Electrophysiologist:  n/a  No chief complaint on file.   History of Present Illness:     [name] is a 82 y.o. female with a hx of     Past Medical History  Diagnosis Date  . Obstructive chronic bronchitis without exacerbation ([place])   . Unspecified essential hypertension   . Unspecified venous (peripheral) insufficiency   . Edema 2005    "since put the glass in them"   . Other abnormal glucose   . Lumbago   . Unspecified nonpsychotic mental disorder   . Unspecified venous (peripheral) insufficiency   . Unspecified vitamin D deficiency   . Anemia, unspecified   . Anxiety   . Arthritis   . Asthma   . Systolic heart failure (HCC)     EF 35 to 40% per echo January 2015  . Depression   . GERD (gastroesophageal reflux disease)     Past Surgical History  Procedure Laterality Date  . Cholecystectomy    . Tonsillectomy    . Cervical spine surgery      x 2  . Partial hysterectomy    . Lumbar disc surgery    . Esophagogastroduodenoscopy N/A 04/05/2015    Procedure: ESOPHAGOGASTRODUODENOSCOPY (EGD);  Surgeon: [name], MD;  Location: [facility];  Service: Endoscopy;  Laterality: N/A;  . Flexible sigmoidoscopy N/A 04/05/2015    Procedure: FLEXIBLE SIGMOIDOSCOPY;  Surgeon: [name], MD;  Location: [facility];  Service: Endoscopy;  Laterality: N/A;    Current Medications: Outpatient Prescriptions Prior to Visit  Medication Sig Dispense Refill  . albuterol (PROAIR HFA) 108 (90 BASE) MCG/ACT inhaler INHALE 2 PUFFS EVERY 4 HOURS AS NEEDED FOR WHEEZING 3 Inhaler 1  . ALPRAZolam (XANAX) 0.5 MG tablet Take 1/2 to 1 tablet by mouth 3 times a day as needed for nerves. 90 tablet 1  . ascorbic acid (VITAMIN C) 500 MG tablet Take 1,000 mg by mouth daily.     . carvedilol (COREG) 6.25 MG tablet TAKE 1  TABLET BY MOUTH TWICE DAILY 180 tablet 2  . clotrimazole-betamethasone (LOTRISONE) cream Apply 1 application topically 2 (two) times daily. To her feet 129 g 1  . Ferrous Sulfate Dried 200 (65 FE) MG TABS Take 1 tablet by mouth 2 (two) times daily. 30 tablet 2  . fluticasone-salmeterol (ADVAIR HFA) 115-21 MCG/ACT inhaler Inhale 2 puffs into the lungs 2 (two) times daily. 3 Inhaler 2  . gabapentin (NEURONTIN) 100 MG capsule Take 1 capsule (100 mg total) by mouth at bedtime. 30 capsule 2  . hydrALAZINE (APRESOLINE) 25 MG tablet Take 1 tablet (25 mg total) by mouth 2 (two) times daily. 60 tablet 11  . hydrOXYzine (ATARAX/VISTARIL) 25 MG tablet Take 1 tablet (25 mg total) by mouth every 4 (four) hours as needed. For itching 100 tablet 3  . ondansetron (ZOFRAN) 4 MG tablet TAKE ONE TABLET BY MOUTH THREE TIMES DAILY AS NEEDED FOR NAUSEA 30 tablet 0  . potassium chloride SA (K-DUR,KLOR-CON) 20 MEQ tablet Take 2 tablets (40 mEq total) by mouth once. 60 tablet 11  . silver sulfADIAZINE (SILVADENE) 1 % cream Apply daily as directed 100 g 2  . THEO-24 400 MG 24 hr capsule TAKE 1 CAPSULE DAILY 90 capsule 1  . torsemide (DEMADEX) 20 MG tablet Take 2 tablets (  40 mg total) by mouth daily.    . traMADol (ULTRAM) 50 MG tablet TAKE 1 TABLET BY MOUTH THREE TIMES DAILY 90 tablet 1  . triamcinolone cream (KENALOG) 0.1 % Apply 1 application topically 2 (two) times daily.  2  . Vitamin D, Ergocalciferol, (DRISDOL) 50000 units CAPS capsule TAKE 1 CAPSULE BY MOUTH EVERY 7 DAYS ON SUNDAY OR MONDAY 4 capsule 3   No facility-administered medications prior to visit.     Allergies:   Aspirin; Codeine; and Latex   Social History   Social History  . Marital Status: Married    Spouse Name: N/A  . Number of Children: 0  . Years of Education: N/A   Occupational History  . retired    Social History Main Topics  . Smoking status: Never Smoker   . Smokeless tobacco: Never Used  . Alcohol Use: No  . Drug Use: No  .  Sexual Activity: Not Currently   Other Topics Concern  . Not on file   Social History Narrative   2 years of college     Family History:  The patient's family history includes Cancer in her paternal aunt; Diabetes in her mother and paternal grandfather; Heart disease in her maternal aunt.   ROS:   Please see the history of present illness.    ROS All other systems reviewed and are negative.   Physical Exam:    VS:  There were no vitals taken for this visit.   GEN: Well nourished, well developed, in no acute distress HEENT: normal Neck: no JVD, no masses Cardiac: Normal S1/S2, RRR; no murmurs, rubs, or gallops, no edema;   carotid bruits,   Respiratory:  clear to auscultation bilaterally; no wheezing, rhonchi or rales GI: soft, nontender, nondistended MS: no deformity or atrophy Skin: warm and dry Neuro: No focal deficits  Psych: Alert and oriented x 3, normal affect  Wt Readings from Last 3 Encounters:  12/03/15 147 lb 12.8 oz (67.042 kg)  11/17/15 146 lb (66.225 kg)  10/05/15 152 lb 12.8 oz (69.31 kg)      Studies/Labs Reviewed:     EKG:  EKG is  ordered today.  The ekg ordered today demonstrates   Recent Labs: 02/11/2015: Pro B Natriuretic peptide (BNP) 262.0* 04/03/2015: ALT 23 04/19/2015: Hemoglobin 11.5*; Platelets 370.0 12/03/2015: BUN 51*; Creat 1.97*; Magnesium 2.3; Potassium 4.0; Sodium 139   Recent Lipid Panel    Component Value Date/Time   CHOL 182 01/28/2010 1023   TRIG 75.0 01/28/2010 1023   HDL 57.90 01/28/2010 1023   CHOLHDL 3 01/28/2010 1023   VLDL 15.0 01/28/2010 1023   LDLCALC 109* 01/28/2010 1023    Additional studies/ records that were reviewed today include:    Myoview 3/15  Normal perfusion with mild apical thinning No ischemia or scar   LV Ejection Fraction: 50%. LV Wall Motion: NL LV Function; NL Wall Motion  Echo 1/15 - Left ventricle: The cavity size was normal. Wall thickness was normal. Systolic function was moderately  reduced. The estimated ejection fraction was in the range of 35% to 40%. There is septal wall akinesis/severe hypokinesis. Otherwisethere is diffusehypokinesis of moderate to severe degree. Features are consistent with a pseudonormal left ventricular filling pattern, with concomitant abnormal relaxation and increased filling pressure (grade 2 diastolic dysfunction). - Atrial septum: No defect or patent foramen ovale was identified. - Pulmonary arteries: Systolic pressure was mildly to moderately increased. PA peak pressure: 28mm Hg (S).  ASSESSMENT:     No  diagnosis found.  PLAN:     In order of problems listed above:  1.    Medication Adjustments/Labs and Tests Ordered: Current medicines are reviewed at length with the patient today.  Concerns regarding medicines are outlined above.  Medication changes, Labs and Tests ordered today are outlined in the Patient Instructions noted below. There are no Patient Instructions on file for this visit. Signed, Richardson Dopp, PA-C  01/20/2016 5:37 PM    [facility] [place], [place], [place]  [address] Phone: [phone]; Fax: [phone]     This encounter was created in error - please disregard.

## 2016-01-21 ENCOUNTER — Telehealth: Payer: Self-pay | Admitting: Interventional Cardiology

## 2016-01-21 ENCOUNTER — Encounter: Payer: Medicare Other | Admitting: Physician Assistant
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
## 2016-01-24 ENCOUNTER — Ambulatory Visit: Payer: Medicare Other | Admitting: Occupational Therapy

## 2016-01-26 ENCOUNTER — Ambulatory Visit: Payer: Medicare Other | Admitting: Occupational Therapy

## 2016-01-26 DIAGNOSIS — N2581 Secondary hyperparathyroidism of renal origin: Secondary | ICD-10-CM | POA: Diagnosis not present

## 2016-01-26 DIAGNOSIS — N183 Chronic kidney disease, stage 3 (moderate): Secondary | ICD-10-CM | POA: Diagnosis not present

## 2016-01-26 DIAGNOSIS — D638 Anemia in other chronic diseases classified elsewhere: Secondary | ICD-10-CM | POA: Diagnosis not present

## 2016-01-26 DIAGNOSIS — I10 Essential (primary) hypertension: Secondary | ICD-10-CM | POA: Diagnosis not present

## 2016-01-26 DIAGNOSIS — I5020 Unspecified systolic (congestive) heart failure: Secondary | ICD-10-CM | POA: Diagnosis not present

## 2016-01-26 DIAGNOSIS — I890 Lymphedema, not elsewhere classified: Secondary | ICD-10-CM

## 2016-01-26 NOTE — Therapy (Signed)
[place] MAIN [facility] SERVICES [address] [place], [place], [address] Phone: [phone]   Fax:  [phone]  Occupational Therapy Treatment  Patient Details  Name: [name] MRN: [id number] Date of Birth: 03/04/1933 No Data Recorded  Encounter Date: 01/26/2016      OT End of Session - 01/26/16 1437    Visit Number 2   Number of Visits 36   Date for OT Re-Evaluation 04/02/16   OT Start Time 0927   OT Stop Time 1010   OT Time Calculation (min) 43 min   Equipment Utilized During Treatment LE Management & Self Care Workbook   Activity Tolerance Patient tolerated treatment well;No increased pain   Behavior During Therapy --  When asked re onset of LE Pt endorsed "people coming into my house and jamming glass into   my feet and legs ".       Past Medical History  Diagnosis Date  . Obstructive chronic bronchitis without exacerbation ([place])   . Unspecified essential hypertension   . Unspecified venous (peripheral) insufficiency   . Edema 2005    "since put the glass in them"   . Other abnormal glucose   . Lumbago   . Unspecified nonpsychotic mental disorder   . Unspecified venous (peripheral) insufficiency   . Unspecified vitamin D deficiency   . Anemia, unspecified   . Anxiety   . Arthritis   . Asthma   . Systolic heart failure (HCC)     EF 35 to 40% per echo January 2015  . Depression   . GERD (gastroesophageal reflux disease)     Past Surgical History  Procedure Laterality Date  . Cholecystectomy    . Tonsillectomy    . Cervical spine surgery      x 2  . Partial hysterectomy    . Lumbar disc surgery    . Esophagogastroduodenoscopy N/A 04/05/2015    Procedure: ESOPHAGOGASTRODUODENOSCOPY (EGD);  Surgeon: [name], MD;  Location: [facility];  Service: Endoscopy;  Laterality: N/A;  . Flexible sigmoidoscopy N/A 04/05/2015    Procedure: FLEXIBLE SIGMOIDOSCOPY;  Surgeon: [name], MD;  Location: [facility];   Service: Endoscopy;  Laterality: N/A;    There were no vitals filed for this visit.  Visit Diagnosis:  Lymphedema      Subjective Assessment - 01/26/16 1422    Subjective  Pt is 30 minutes late for OT visit 2 to treat BLE lymphedema. Pt is accompanied by her nephew and niece today. Pt is 30 minutes late for a 1 hour apt so treatment plan was abbreviated.    Patient is accompained by: Family member   Pertinent History ith    Limitations Difficulty walking, decreased standing tolerance, impaired transfer ability, decreased ankle AROM, Pain in BLE, Inability to fit street shoes w/ increased falls risk, increased infection risk.   Patient Stated Goals reduce swelling and legs feel better   Currently in Pain? No/denies   Pain Onset --             LYMPHEDEMA/ONCOLOGY QUESTIONNAIRE - 01/26/16 1427    Lymphedema Assessments   Lymphedema Assessments Lower extremities   Left Lower Extremity Lymphedema   Other LLE A-D (ankle to knee) limb volume= 4978.28 ml   Other LLE A-G (ankle to groin) limb volume= 9077.52 ml   Other A-D limb volume differential (LVD)= 28.34%, L>R. A-G LVD = 9.44%. L>R.  OT Treatments/Exercises (OP) - 01/26/16 0001    Manual Therapy   Manual Therapy Edema management;Compression Bandaging   Manual therapy comments BLE anatomical measurements for initial comparative limb volumetrics   Compression Bandaging LLE gradient compression wraps applied from toes to below knee: NO TOE WRAPS until nails cut. (toe wrap x1 under cotton stockinett); 8 cm x 5 m x 1 to foot and ankle, 10 cm  x 5 m x2, 12 cm x5 cm x 1, applied circumferentially in custommary layered gradient configuration  over .04 x 10 cm x 5 m Rosidol Soft x 1.5 .rolls.                 OT Education - 01/26/16 1435    Education provided No   Education Details No time for planned patient and caregiver LE ADL training today for compression wrapping and review of goals due to time  constraints.             OT Long Term Goals - 01/07/16 1333    OT LONG TERM GOAL #1   Title Lymphedema (LE) self-care: Pt able to correctly apply gradient compression wraps with maximum assistance from caregiver within 2 weeks for optimal limb volume reduction.   Baseline dependent   Time 2   Period Weeks   Status New   OT LONG TERM GOAL #2   Title Pt to achieve at least 10% BLE limb volume reductions during Intensive CDT to limit LE progression, decrease infection risk, to reduce foot and leg pain, and to improve ability to fit lower body clothing and shoes.   Baseline dependent   Time 12   Period Weeks   Status New   OT LONG TERM GOAL #3   Title Pt to achieve at least 10% BLE limb volume reductions during Intensive CDT to limit LE progression, decrease infection risk, to reduce foot and leg pain, and to improve ability to fit lower body clothing and shoes.   Baseline dependent   Time 12   Period Weeks   Status New   OT LONG TERM GOAL #4   Title Lymphedema (LE) self-care : Pt >/= 85 % compliant with all daily LE self-care protocols, including simple self-manual lymphatic drainage (MLD), skin care, lymphatic pumping therex, and donning/ doffing progression garments with needed level of caregiver assistance to limit LE progression, infection risk and further functional decline.     Baseline dependent   Time 12   Period Weeks   Status New   OT LONG TERM GOAL #5   Title Pt to tolerate daily compression wraps, garments and devices in keeping w/ prescribed wear regime within 1 week of issue date to progress and retain clinical and functional gains and to limit LE progression.   Baseline dependent   Time 12   Period Weeks   Status New   Long Term Additional Goals   Additional Long Term Goals Yes               Plan - 01/26/16 1431    Clinical Impression Statement Had discussion with patient and caregiver about importance of arriving on time for OT appointments to ensure  that we can achieve optimal clinical outcome. Pt is dependent on her nephew for transportation. Pt verbalized understanding that unless we maximize our full hour long appointments, prognosis will  be negatively impacted. We were unable to teach compression wraping today vs time constraints. A-D limb volume differential (LVD) reveals a severe 28.34% with LLE > than the  RLE below knee to ankle. A-G (Toes to groin ) LVD measures 9.44% w/ LLE overall larger that R from toes to groin.    Pt will benefit from skilled therapeutic intervention in order to improve on the following deficits (Retired) Decreased range of motion;Difficulty walking;Impaired flexibility;Increased edema;Decreased activity tolerance;Decreased knowledge of precautions;Decreased skin integrity;Decreased balance;Decreased knowledge of use of DME;Decreased scar mobility;Pain;Decreased mobility   Rehab Potential Fair   Clinical Impairments Affecting Rehab Potential Pt presents with decreased prognosis 2/2 overall health status w/ multiple comorbidities and history of non-compliance with previous  lymphedema treatment.   OT Frequency 3x / week   OT Duration 12 weeks        Problem List Patient Active Problem List   Diagnosis Date Noted  . Headache 06/22/2015  . Anemia, iron deficiency 04/05/2015  . Rectal benign neoplasm   . Asthma, chronic 04/04/2015  . history of Tubular adenoma of colon 04/04/2015  . 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 Hypokalemia 04/04/2015  . CKD (chronic kidney disease) stage 3, GFR 30-59 ml/min 04/03/2015  . Dehydration 04/03/2015  . Skin ulcer ([place])   . History of colonic polyps 12/08/2014  . Edema 04/14/2014  . Chronic systolic heart failure ([place]) 11/20/2013  . Nausea with vomiting 11/13/2013  . Lymphedema of lower extremity 10/17/2013  . 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[place]) 04/04/2013  . MGUS (monoclonal gammopathy of unknown significance) 05/15/2012  . VITAMIN D DEFICIENCY 01/29/2010  . BURSITIS,  LEFT ELBOW 01/29/2010  . DERMATITIS 09/30/2009  . DIABETES MELLITUS, BORDERLINE 01/11/2009  . Paranoid delusion ([place]) 08/02/2008  . OBSTRUCTIVE CHRONIC BRONCHITIS 08/02/2008  . Essential hypertension 11/11/2007  . Venous (peripheral) insufficiency 11/11/2007  . RENAL CALCULUS 11/11/2007  . LOW BACK PAIN, CHRONIC 11/11/2007    Andrey Spearman, MS, OTR/L, [facility] 01/26/2016 2:39 PM  [place] [facility]IN [facility] SERVICES [address] [place], [place], [address] Phone: [phone]   Fax:  [phone]  Name: [name] MRN: [id number] Date of Birth: 03/04/1933

## 2016-01-26 NOTE — Patient Instructions (Signed)

## 2016-01-27 ENCOUNTER — Other Ambulatory Visit: Payer: Self-pay | Admitting: Interventional Cardiology

## 2016-01-27 ENCOUNTER — Other Ambulatory Visit: Payer: Self-pay | Admitting: Pulmonary Disease

## 2016-01-27 ENCOUNTER — Telehealth: Payer: Self-pay | Admitting: Pulmonary Disease

## 2016-01-27 MED ORDER — POTASSIUM CHLORIDE CRYS ER 20 MEQ PO TBCR: 40.0000 meq | EXTENDED_RELEASE_TABLET | Freq: Once | ORAL | Status: DC

## 2016-01-27 NOTE — Telephone Encounter (Signed)
ATC pt, line rings busy x 3 WCB 

## 2016-01-28 ENCOUNTER — Ambulatory Visit: Payer: Medicare Other | Admitting: Occupational Therapy

## 2016-01-28 ENCOUNTER — Other Ambulatory Visit: Payer: Self-pay | Admitting: *Deleted

## 2016-01-28 DIAGNOSIS — I890 Lymphedema, not elsewhere classified: Secondary | ICD-10-CM

## 2016-01-28 DIAGNOSIS — I5022 Chronic systolic (congestive) heart failure: Secondary | ICD-10-CM

## 2016-01-28 MED ORDER — CARVEDILOL 6.25 MG PO TABS: ORAL_TABLET | ORAL | Status: DC

## 2016-01-28 NOTE — Telephone Encounter (Signed)
Per Dr. Lenna Gilford:  Dr. Lenna Gilford called and spoke with pharmacy and ok'd refills on all meds at this time.  States that patient has appointment on 02/08/16 and needs to bring in all medication bottles, including inhalers in bag.  Do Not come to appointment without medications.  Advised patient of above. Patient told 3 times to make sure that she brings ALL MEDICATION BOTTLES AND INHALERS to appointment Patient verbalized understanding and said that she will not forget her medications. Nothing further needed.
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
## 2016-01-28 NOTE — Telephone Encounter (Signed)
lmomtcb X1 for pt

## 2016-01-28 NOTE — Patient Instructions (Signed)
LE instructions and precautions as established- see initial eval.   

## 2016-01-28 NOTE — Therapy (Signed)
[place] MAIN [facility] SERVICES [address] [place], [place], [address] Phone: [phone]   Fax:  [phone]  Occupational Therapy Treatment  Patient Details  Name: [name] MRN: [id number] Date of Birth: 03/04/1933 No Data Recorded  Encounter Date: 01/28/2016      OT End of Session - 01/28/16 1125    Visit Number 3   Number of Visits 36   Date for OT Re-Evaluation 04/02/16   OT Start Time 0911   OT Stop Time 1013   OT Time Calculation (min) 62 min   Equipment Utilized During Treatment LE Management & Self Care Workbook   Activity Tolerance Patient tolerated treatment well;No increased pain   Behavior During Therapy WFL for tasks assessed/performed  When asked re onset of LE Pt endorsed "people coming into my house and jamming glass into   my feet and legs ".       Past Medical History  Diagnosis Date  . Obstructive chronic bronchitis without exacerbation ([place])   . Unspecified essential hypertension   . Unspecified venous (peripheral) insufficiency   . Edema 2005    "since put the glass in them"   . Other abnormal glucose   . Lumbago   . Unspecified nonpsychotic mental disorder   . Unspecified venous (peripheral) insufficiency   . Unspecified vitamin D deficiency   . Anemia, unspecified   . Anxiety   . Arthritis   . Asthma   . Systolic heart failure (HCC)     EF 35 to 40% per echo January 2015  . Depression   . GERD (gastroesophageal reflux disease)     Past Surgical History  Procedure Laterality Date  . Cholecystectomy    . Tonsillectomy    . Cervical spine surgery      x 2  . Partial hysterectomy    . Lumbar disc surgery    . Esophagogastroduodenoscopy N/A 04/05/2015    Procedure: ESOPHAGOGASTRODUODENOSCOPY (EGD);  Surgeon: [name], MD;  Location: [facility];  Service: Endoscopy;  Laterality: N/A;  . Flexible sigmoidoscopy N/A 04/05/2015    Procedure: FLEXIBLE SIGMOIDOSCOPY;  Surgeon: [name],  MD;  Location: [facility];  Service: Endoscopy;  Laterality: N/A;    There were no vitals filed for this visit.  Visit Diagnosis:  Lymphedema      Subjective Assessment - 01/28/16 1120    Subjective  Pt is 10 minutes late for 60 minute OT treatment visit # 3 to treat BLE lymphedema. Pt is accompanied by her nephew and niece today. Pt has no new complaints. [employer] members here to learn to assist w/ compression bandaging.    Patient is accompained by: Family member   Pertinent History ith    Limitations Difficulty walking, decreased standing tolerance, impaired transfer ability, decreased ankle AROM, Pain in BLE, Inability to fit street shoes w/ increased falls risk, increased infection risk.   Patient Stated Goals reduce swelling and legs feel better   Currently in Pain? Yes   Pain Location Back   Pain Type Chronic pain   Pain Onset More than a month ago                              OT Education - 01/28/16 1121    Education provided Yes   Education Details Emphasis of skilled education/ ADL training today on intro level patient and caregiver training for application of BLE gradient compression wrapping  using proper techniques.    Person(s) Educated Patient;Caregiver(s)   Methods Explanation;Demonstration;Tactile cues;Verbal cues;Handout   Comprehension Verbalized understanding;Need further instruction             OT Long Term Goals - 01/07/16 1333    OT LONG TERM GOAL #1   Title Lymphedema (LE) self-care: Pt able to correctly apply gradient compression wraps with maximum assistance from caregiver within 2 weeks for optimal limb volume reduction.   Baseline dependent   Time 2   Period Weeks   Status New   OT LONG TERM GOAL #2   Title Pt to achieve at least 10% BLE limb volume reductions during Intensive CDT to limit LE progression, decrease infection risk, to reduce foot and leg pain, and to improve ability to fit lower body clothing and shoes.    Baseline dependent   Time 12   Period Weeks   Status New   OT LONG TERM GOAL #3   Title Pt to achieve at least 10% BLE limb volume reductions during Intensive CDT to limit LE progression, decrease infection risk, to reduce foot and leg pain, and to improve ability to fit lower body clothing and shoes.   Baseline dependent   Time 12   Period Weeks   Status New   OT LONG TERM GOAL #4   Title Lymphedema (LE) self-care : Pt >/= 85 % compliant with all daily LE self-care protocols, including simple self-manual lymphatic drainage (MLD), skin care, lymphatic pumping therex, and donning/ doffing progression garments with needed level of caregiver assistance to limit LE progression, infection risk and further functional decline.     Baseline dependent   Time 12   Period Weeks   Status New   OT LONG TERM GOAL #5   Title Pt to tolerate daily compression wraps, garments and devices in keeping w/ prescribed wear regime within 1 week of issue date to progress and retain clinical and functional gains and to limit LE progression.   Baseline dependent   Time 12   Period Weeks   Status New   Long Term Additional Goals   Additional Long Term Goals Yes               Plan - 01/28/16 1127    Clinical Impression Statement Pt late again today, but less so, progress towards compliance goal. By end of session caregivers able to verbalize understanding of principals of gradient compression wrapping and sequence for application techniques.  Handout given.   Pt will benefit from skilled therapeutic intervention in order to improve on the following deficits (Retired) Decreased range of motion;Difficulty walking;Impaired flexibility;Increased edema;Decreased activity tolerance;Decreased knowledge of precautions;Decreased skin integrity;Decreased balance;Decreased knowledge of use of DME;Decreased scar mobility;Pain;Decreased mobility   Rehab Potential Fair   Clinical Impairments Affecting Rehab Potential Pt  presents with decreased prognosis 2/2 overall health status w/ multiple comorbidities and history of non-compliance with previous  lymphedema treatment.   OT Frequency 3x / week   OT Duration 12 weeks        Problem List Patient Active Problem List   Diagnosis Date Noted  . Headache 06/22/2015  . Anemia, iron deficiency 04/05/2015  . Rectal benign neoplasm   . Asthma, chronic 04/04/2015  . history of Tubular adenoma of colon 04/04/2015  . 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 Hypokalemia 04/04/2015  . CKD (chronic kidney disease) stage 3, GFR 30-59 ml/min 04/03/2015  . Dehydration 04/03/2015  . Skin ulcer ([place])   . History of colonic polyps  12/08/2014  . Edema 04/14/2014  . Chronic systolic heart failure ([place]) 11/20/2013  . Nausea with vomiting 11/13/2013  . Lymphedema of lower extremity 10/17/2013  . 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 MGUS (monoclonal gammopathy of unknown significance) 05/15/2012  . VITAMIN D DEFICIENCY 01/29/2010  . BURSITIS, LEFT ELBOW 01/29/2010  . DERMATITIS 09/30/2009  . DIABETES MELLITUS, BORDERLINE 01/11/2009  . Paranoid delusion ([place]) 08/02/2008  . OBSTRUCTIVE CHRONIC BRONCHITIS 08/02/2008  . Essential hypertension 11/11/2007  . Venous (peripheral) insufficiency 11/11/2007  . RENAL CALCULUS 11/11/2007  . LOW BACK PAIN, CHRONIC 11/11/2007    Andrey Spearman, MS, OTR/L, [facility] 01/28/2016 11:30 AM   [place] MAIN [facility] SERVICES [address] [place], [place], [address] Phone: [phone]   Fax:  [phone]  Name: [name] MRN: [id number] Date of Birth: 03/04/1933

## 2016-01-31 ENCOUNTER — Ambulatory Visit: Payer: Medicare Other | Attending: Cardiology | Admitting: Occupational Therapy

## 2016-01-31 DIAGNOSIS — I890 Lymphedema, not elsewhere classified: Secondary | ICD-10-CM | POA: Insufficient documentation

## 2016-01-31 NOTE — Patient Instructions (Signed)
LE instructions and precautions as established- see initial eval.   

## 2016-01-31 NOTE — Therapy (Signed)
[place] MAIN [facility] SERVICES [address] [place], [place], [address] Phone: [phone]   Fax:  [phone]  Occupational Therapy Treatment  Patient Details  Name: [name] MRN: [id number] Date of Birth: 03/04/1933 No Data Recorded  Encounter Date: 01/31/2016      OT End of Session - 01/31/16 1120    Visit Number 4   Number of Visits 36   Date for OT Re-Evaluation 04/02/16   OT Start Time 0910   OT Stop Time 1009   OT Time Calculation (min) 59 min   Equipment Utilized During Treatment LE Management & Self Care Workbook   Activity Tolerance Patient tolerated treatment well;No increased pain   Behavior During Therapy WFL for tasks assessed/performed  When asked re onset of LE Pt endorsed "people coming into my house and jamming glass into   my feet and legs ".       Past Medical History  Diagnosis Date  . Obstructive chronic bronchitis without exacerbation ([place])   . Unspecified essential hypertension   . Unspecified venous (peripheral) insufficiency   . Edema 2005    "since put the glass in them"   . Other abnormal glucose   . Lumbago   . Unspecified nonpsychotic mental disorder   . Unspecified venous (peripheral) insufficiency   . Unspecified vitamin D deficiency   . Anemia, unspecified   . Anxiety   . Arthritis   . Asthma   . Systolic heart failure (HCC)     EF 35 to 40% per echo January 2015  . Depression   . GERD (gastroesophageal reflux disease)     Past Surgical History  Procedure Laterality Date  . Cholecystectomy    . Tonsillectomy    . Cervical spine surgery      x 2  . Partial hysterectomy    . Lumbar disc surgery    . Esophagogastroduodenoscopy N/A 04/05/2015    Procedure: ESOPHAGOGASTRODUODENOSCOPY (EGD);  Surgeon: [name], MD;  Location: [facility];  Service: Endoscopy;  Laterality: N/A;  . Flexible sigmoidoscopy N/A 04/05/2015    Procedure: FLEXIBLE SIGMOIDOSCOPY;  Surgeon: [name],  MD;  Location: [facility];  Service: Endoscopy;  Laterality: N/A;    There were no vitals filed for this visit.  Visit Diagnosis:  Lymphedema      Subjective Assessment - 01/31/16 1121    Subjective  Pt is 10 minutes late for 60 minute OT visit # 4 to treat BLE lymphedema. Pt is accompanied by her nephew and niece today. Pt reports she was able to keep compression wraps in place as directed and niece was able to re-wrap.   Patient is accompained by: Family member   Pertinent History ith    Limitations Difficulty walking, decreased standing tolerance, impaired transfer ability, decreased ankle AROM, Pain in BLE, Inability to fit street shoes w/ increased falls risk, increased infection risk.   Patient Stated Goals reduce swelling and legs feel better   Currently in Pain? Yes   Pain Score --  not rated   Pain Location Back   Pain Onset More than a month ago                              OT Education - 01/31/16 1123    Education provided Yes   Education Details Continued Pt and caregiver edu for knee length multi layer gradient compression wrapping   Person(s) Educated Patient;Caregiver(s)  Methods Explanation;Demonstration;Tactile cues;Verbal cues;Handout;Other (comment)  made cell phone video on Pt's phone for reference   Comprehension Verbalized understanding;Returned demonstration;Verbal cues required;Tactile cues required;Need further instruction             OT Long Term Goals - 01/07/16 1333    OT LONG TERM GOAL #1   Title Lymphedema (LE) self-care: Pt able to correctly apply gradient compression wraps with maximum assistance from caregiver within 2 weeks for optimal limb volume reduction.   Baseline dependent   Time 2   Period Weeks   Status New   OT LONG TERM GOAL #2   Title Pt to achieve at least 10% BLE limb volume reductions during Intensive CDT to limit LE progression, decrease infection risk, to reduce foot and leg pain, and to improve  ability to fit lower body clothing and shoes.   Baseline dependent   Time 12   Period Weeks   Status New   OT LONG TERM GOAL #3   Title Pt to achieve at least 10% BLE limb volume reductions during Intensive CDT to limit LE progression, decrease infection risk, to reduce foot and leg pain, and to improve ability to fit lower body clothing and shoes.   Baseline dependent   Time 12   Period Weeks   Status New   OT LONG TERM GOAL #4   Title Lymphedema (LE) self-care : Pt >/= 85 % compliant with all daily LE self-care protocols, including simple self-manual lymphatic drainage (MLD), skin care, lymphatic pumping therex, and donning/ doffing progression garments with needed level of caregiver assistance to limit LE progression, infection risk and further functional decline.     Baseline dependent   Time 12   Period Weeks   Status New   OT LONG TERM GOAL #5   Title Pt to tolerate daily compression wraps, garments and devices in keeping w/ prescribed wear regime within 1 week of issue date to progress and retain clinical and functional gains and to limit LE progression.   Baseline dependent   Time 12   Period Weeks   Status New   Long Term Additional Goals   Additional Long Term Goals Yes               Plan - 01/31/16 1125    Clinical Impression Statement Tardiness continues to limit clinical treatment time. Despite wraps beinmg re-applied in reverse order ( foam and cast padding on the outside and stretch net under short stretch wraps) limb volume in LLE appears to have reduced ~ 25% since last seen Pt tolerating compression wraps without difficulty.   Pt will benefit from skilled therapeutic intervention in order to improve on the following deficits (Retired) Decreased range of motion;Difficulty walking;Impaired flexibility;Increased edema;Decreased activity tolerance;Decreased knowledge of precautions;Decreased skin integrity;Decreased balance;Decreased knowledge of use of  DME;Decreased scar mobility;Pain;Decreased mobility   Rehab Potential Fair   Clinical Impairments Affecting Rehab Potential Pt presents with decreased prognosis 2/2 overall health status w/ multiple comorbidities and history of non-compliance with previous  lymphedema treatment.   OT Frequency 3x / week   OT Duration 12 weeks        Problem List Patient Active Problem List   Diagnosis Date Noted  . Headache 06/22/2015  . Anemia, iron deficiency 04/05/2015  . Rectal benign neoplasm   . Asthma, chronic 04/04/2015  . history of Tubular adenoma of colon 04/04/2015  . 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 Hypokalemia 04/04/2015  . CKD (chronic kidney disease) stage 3, GFR  30-59 ml/min 04/03/2015  . Dehydration 04/03/2015  . Skin ulcer ([place])   . History of colonic polyps 12/08/2014  . Edema 04/14/2014  . Chronic systolic heart failure ([place]) 11/20/2013  . Nausea with vomiting 11/13/2013  . Lymphedema of lower extremity 10/17/2013  . 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[place]) 04/04/2013  . MGUS (monoclonal gammopathy of unknown significance) 05/15/2012  . VITAMIN D DEFICIENCY 01/29/2010  . BURSITIS, LEFT ELBOW 01/29/2010  . DERMATITIS 09/30/2009  . DIABETES MELLITUS, BORDERLINE 01/11/2009  . Paranoid delusion ([place]) 08/02/2008  . OBSTRUCTIVE CHRONIC BRONCHITIS 08/02/2008  . Essential hypertension 11/11/2007  . Venous (peripheral) insufficiency 11/11/2007  . RENAL CALCULUS 11/11/2007  . LOW BACK PAIN, CHRONIC 11/11/2007    Ansel Bong 01/31/2016, 11:28 AM  [place] MAIN [facility] SERVICES [address] [place], [place], [address] Phone: [phone]   Fax:  [phone]  Name: THANA HEEB MRN: [id number] Date of Birth: 03/04/1933

## 2016-02-01 ENCOUNTER — Telehealth: Payer: Self-pay | Admitting: Pulmonary Disease

## 2016-02-01 DIAGNOSIS — I5022 Chronic systolic (congestive) heart failure: Secondary | ICD-10-CM

## 2016-02-01 MED ORDER — CARVEDILOL 6.25 MG PO TABS: ORAL_TABLET | ORAL | Status: DC

## 2016-02-01 MED ORDER — TORSEMIDE 20 MG PO TABS: 60.0000 mg | ORAL_TABLET | Freq: Two times a day (BID) | ORAL | Status: DC

## 2016-02-01 NOTE — Telephone Encounter (Signed)
Refills sent in. Nothing further was needed.

## 2016-02-02 ENCOUNTER — Ambulatory Visit: Payer: Medicare Other | Admitting: Occupational Therapy

## 2016-02-02 ENCOUNTER — Ambulatory Visit: Payer: Medicare Other | Admitting: Internal Medicine

## 2016-02-02 ENCOUNTER — Other Ambulatory Visit: Payer: Self-pay

## 2016-02-02 DIAGNOSIS — I890 Lymphedema, not elsewhere classified: Secondary | ICD-10-CM

## 2016-02-02 MED ORDER — GABAPENTIN 100 MG PO CAPS: 100.0000 mg | ORAL_CAPSULE | Freq: Every day | ORAL | Status: DC

## 2016-02-02 NOTE — Progress Notes (Signed)
Cardiology Office Note:    Date:  02/03/2016   ID:  [name], DOB [birth year], MRN [id number]  PCP:  Gildardo Cranker, DO  Cardiologist:  Dr. Daneen Schick   Electrophysiologist:  N/a Nephrology: Dr. Posey Pronto  Referring MD: Gildardo Cranker, DO   Chief Complaint  Patient presents with  . Congestive Heart Failure    History of Present Illness:     [name] is a 82 y.o. female with a hx of venous insufficiency, systolic CHF, HTN, COPD, CKD, MGUS, anemia.  Echo in 1/15 with EF 35-40%.  FU nuclear study in 3/15 with EF 50%. Last seen by Dr. Daneen Schick 9/16.  Last seen in this office by Cecilie Kicks, NP in 2/17.    She has been going to the lymphedema clinic at [facility]. Her legs have improved significantly. She still gets her left leg wrapped. She has chronic dyspnea with moderate activities. She sleeps on 2 pillows chronically. She denies PND. She denies chest pain. She denies syncope. She has a chronic cough related to her asthma. Recently seen by nephrology.  Past Medical History  Diagnosis Date  . Obstructive chronic bronchitis without exacerbation ([place])   . Unspecified essential hypertension   . Unspecified venous (peripheral) insufficiency   . Edema 2005    "since put the glass in them"   . Other abnormal glucose   . Lumbago   . Unspecified nonpsychotic mental disorder   . Unspecified venous (peripheral) insufficiency   . Unspecified vitamin D deficiency   . Anemia, unspecified   . Anxiety   . Arthritis   . Asthma   . Systolic heart failure (HCC)     EF 35 to 40% per echo January 2015  . Depression   . GERD (gastroesophageal reflux disease)     Past Surgical History  Procedure Laterality Date  . Cholecystectomy    . Tonsillectomy    . Cervical spine surgery      x 2  . Partial hysterectomy    . Lumbar disc surgery    . Esophagogastroduodenoscopy N/A 04/05/2015    Procedure: ESOPHAGOGASTRODUODENOSCOPY (EGD);  Surgeon: [name], MD;  Location: [facility];  Service: Endoscopy;  Laterality: N/A;  . Flexible sigmoidoscopy N/A 04/05/2015    Procedure: FLEXIBLE SIGMOIDOSCOPY;  Surgeon: [name], MD;  Location: [facility];  Service: Endoscopy;  Laterality: N/A;    Current Medications: Outpatient Prescriptions Prior to Visit  Medication Sig Dispense Refill  . albuterol (PROAIR HFA) 108 (90 BASE) MCG/ACT inhaler INHALE 2 PUFFS EVERY 4 HOURS AS NEEDED FOR WHEEZING 3 Inhaler 1  . ALPRAZolam (XANAX) 0.5 MG tablet Take 1/2 to 1 tablet by mouth 3 times a day as needed for nerves. 90 tablet 1  . ascorbic acid (VITAMIN C) 500 MG tablet Take 1,000 mg by mouth daily.     . carvedilol (COREG) 6.25 MG tablet TAKE 1 TABLET BY MOUTH TWICE DAILY 180 tablet 1  . clotrimazole-betamethasone (LOTRISONE) cream Apply 1 application topically 2 (two) times daily. To her feet 129 g 1  . Ferrous Sulfate Dried 200 (65 FE) MG TABS Take 1 tablet by mouth 2 (two) times daily. 30 tablet 2  . fluticasone-salmeterol (ADVAIR HFA) 115-21 MCG/ACT inhaler Inhale 2 puffs into the lungs 2 (two) times daily. 3 Inhaler 2  . gabapentin (NEURONTIN) 100 MG capsule Take 1 capsule (100 mg total) by mouth at bedtime. 90 capsule 0  . hydrALAZINE (APRESOLINE) 25 MG tablet Take 1 tablet (25 mg  total) by mouth 2 (two) times daily. 60 tablet 11  . hydrOXYzine (ATARAX/VISTARIL) 25 MG tablet Take 1 tablet (25 mg total) by mouth every 4 (four) hours as needed. For itching 100 tablet 3  . ondansetron (ZOFRAN) 4 MG tablet TAKE ONE TABLET BY MOUTH THREE TIMES DAILY AS NEEDED FOR NAUSEA 30 tablet 0  . potassium chloride SA (K-DUR,KLOR-CON) 20 MEQ tablet Take 2 tablets (40 mEq total) by mouth once. 180 tablet 3  . torsemide (DEMADEX) 20 MG tablet Take 3 tablets (60 mg total) by mouth 2 (two) times daily. 540 tablet 1  . traMADol (ULTRAM) 50 MG tablet TAKE 1 TABLET BY MOUTH THREE TIMES DAILY 90 tablet 1  . triamcinolone cream (KENALOG) 0.1 % Apply 1 application topically 2 (two) times daily.   2  . Vitamin D, Ergocalciferol, (DRISDOL) 50000 units CAPS capsule TAKE 1 CAPSULE BY MOUTH EVERY 7 DAYS ON SUNDAY OR MONDAY 4 capsule 3  . silver sulfADIAZINE (SILVADENE) 1 % cream Apply daily as directed 100 g 2  . THEO-24 400 MG 24 hr capsule TAKE 1 CAPSULE DAILY 90 capsule 0   No facility-administered medications prior to visit.     Allergies:   Aspirin; Codeine; and Latex   Social History   Social History  . Marital Status: Married    Spouse Name: N/A  . Number of Children: 0  . Years of Education: N/A   Occupational History  . retired    Social History Main Topics  . Smoking status: Never Smoker   . Smokeless tobacco: Never Used  . Alcohol Use: No  . Drug Use: No  . Sexual Activity: Not Currently   Other Topics Concern  . None   Social History Narrative   2 years of college     Family History:  The patient's family history includes Cancer in her paternal aunt; Diabetes in her mother and paternal grandfather; Heart disease in her maternal aunt.   ROS:   Please see the history of present illness.    Review of Systems  Cardiovascular: Positive for dyspnea on exertion and leg swelling.  Respiratory: Positive for cough.   Musculoskeletal: Positive for joint pain.  Genitourinary: Positive for incomplete emptying.  Psychiatric/Behavioral: The patient is nervous/anxious.    All other systems reviewed and are negative.   Physical Exam:    VS:  BP 140/58 mmHg  Pulse 68  Ht 5\' 3"  (1.6 m)  Wt 148 lb 12.8 oz (67.495 kg)  BMI 26.37 kg/m2   GEN: Well nourished, well developed, in no acute distress HEENT: normal Neck: no JVD, no masses Cardiac: Normal S1/S2, RRR; no murmurs, rubs, or gallops, tr-1+ RLE edema; 2++ LLE (wrapped)  Respiratory:  clear to auscultation bilaterally; no wheezing, rhonchi or rales GI: soft, nontender, nondistended MS: no deformity or atrophy Skin: warm and dry Neuro: No focal deficits  Psych: Alert and oriented x 3, normal affect  Wt  Readings from Last 3 Encounters:  02/03/16 148 lb 12.8 oz (67.495 kg)  12/03/15 147 lb 12.8 oz (67.042 kg)  11/17/15 146 lb (66.225 kg)      Studies/Labs Reviewed:     EKG:  EKG is not ordered today.  The ekg ordered today demonstrates n/a  Recent Labs: 02/11/2015: Pro B Natriuretic peptide (BNP) 262.0* 04/03/2015: ALT 23 04/19/2015: Hemoglobin 11.5*; Platelets 370.0 12/03/2015: BUN 51*; Creat 1.97*; Magnesium 2.3; Potassium 4.0; Sodium 139   Recent Lipid Panel    Component Value Date/Time   CHOL 182 01/28/2010  1023   TRIG 75.0 01/28/2010 1023   HDL 57.90 01/28/2010 1023   CHOLHDL 3 01/28/2010 1023   VLDL 15.0 01/28/2010 1023   LDLCALC 109* 01/28/2010 1023    Additional studies/ records that were reviewed today include:   Myoview 3/15 Normal perfusion with mild apical thinning No ischemia or scar LV Ejection Fraction: 50%. LV Wall Motion: NL LV Function; NL Wall Motion  Echo 1/15 EF 35-40%septal AK/severe HK, Gr 2 DD, PASP 50 mmHg   ASSESSMENT:     1. Chronic combined systolic and diastolic CHF (congestive heart failure) ([place])   2. Essential hypertension   3. CKD (chronic kidney disease) stage 3, GFR 30-59 ml/min   4. Bilateral edema of lower extremity     PLAN:     In order of problems listed above:  1. Chronic combined systolic and diastolic CHF - EF [id number] by nuc study in 2015.  Her volume is stable.  Renal function is followed by nephrology. She is no longer on ACE inhibitor due to worsening renal function.  She remains on Torsemide, Hydralazine, Coreg.    2. HTN - BP controlled.   3. CKD - FU with Dr. Posey Pronto.  4. Edema - She is followed at the lymphedema clinic with good results.     Medication Adjustments/Labs and Tests Ordered: Current medicines are reviewed at length with the patient today.  Concerns regarding medicines are outlined above.  Medication changes, Labs and Tests ordered today are outlined in the Patient Instructions noted below. Patient  Instructions  Medication Instructions:  No changes.  See your medication list.  Labwork: None today.   Testing/Procedures: None   Follow-Up: Dr. Daneen Schick 05/18/16 @ 3 PM   Any Other Special Instructions Will Be Listed Below (If Applicable).  If you need a refill on your cardiac medications before your next appointment, please call your pharmacy.    Signed, Richardson Dopp, PA-C  02/03/2016 5:09 PM    [facility] [place], [place], [place]  [address] Phone: [phone]; Fax: [phone]

## 2016-02-02 NOTE — Patient Instructions (Signed)
LE instructions and precautions as established- see initial eval.   

## 2016-02-02 NOTE — Therapy (Signed)
[place] MAIN [facility] SERVICES [address] [place], [place], [address] Phone: [phone]   Fax:  [phone]  Occupational Therapy Treatment  Patient Details  Name: [name] MRN: [id number] Date of Birth: 03/04/1933 No Data Recorded  Encounter Date: 02/02/2016      OT End of Session - 02/02/16 1158    Visit Number 5   Number of Visits 36   Date for OT Re-Evaluation 04/02/16   OT Start Time 0909   OT Stop Time 1012   OT Time Calculation (min) 63 min   Equipment Utilized During Treatment LE Management & Self Care Workbook   Activity Tolerance Patient tolerated treatment well;No increased pain   Behavior During Therapy WFL for tasks assessed/performed  When asked re onset of LE Pt endorsed "people coming into my house and jamming glass into   my feet and legs ".       Past Medical History  Diagnosis Date  . Obstructive chronic bronchitis without exacerbation ([place])   . Unspecified essential hypertension   . Unspecified venous (peripheral) insufficiency   . Edema 2005    "since put the glass in them"   . Other abnormal glucose   . Lumbago   . Unspecified nonpsychotic mental disorder   . Unspecified venous (peripheral) insufficiency   . Unspecified vitamin D deficiency   . Anemia, unspecified   . Anxiety   . Arthritis   . Asthma   . Systolic heart failure (HCC)     EF 35 to 40% per echo January 2015  . Depression   . GERD (gastroesophageal reflux disease)     Past Surgical History  Procedure Laterality Date  . Cholecystectomy    . Tonsillectomy    . Cervical spine surgery      x 2  . Partial hysterectomy    . Lumbar disc surgery    . Esophagogastroduodenoscopy N/A 04/05/2015    Procedure: ESOPHAGOGASTRODUODENOSCOPY (EGD);  Surgeon: [name], MD;  Location: [facility];  Service: Endoscopy;  Laterality: N/A;  . Flexible sigmoidoscopy N/A 04/05/2015    Procedure: FLEXIBLE SIGMOIDOSCOPY;  Surgeon: [name],  MD;  Location: [facility];  Service: Endoscopy;  Laterality: N/A;    There were no vitals filed for this visit.  Visit Diagnosis:  Lymphedema      Subjective Assessment - 02/02/16 1151    Subjective  Pt is 9 minutes late for 60 minute OT visit # 5 to treat BLE lymphedema. Wraps are applied incorrectly. Pt reports they were in a hurry.   Patient is accompained by: Family member   Pertinent History --   Limitations Difficulty walking, decreased standing tolerance, impaired transfer ability, decreased ankle AROM, Pain in BLE, Inability to fit street shoes w/ increased falls risk, increased infection risk.   Patient Stated Goals reduce swelling and legs feel better   Currently in Pain? No/denies   Pain Onset More than a month ago                      OT Treatments/Exercises (OP) - 02/02/16 0001    ADLs   ADL Education Given Yes   Manual Therapy   Manual Therapy Manual Lymphatic Drainage (MLD);Compression Bandaging;Edema management   Edema Management skin care w/ low pH castor oil to LLE during MLD   Manual Lymphatic Drainage (MLD) Manual lymph drainage (MLD) to LLE in supine utilizing functional inguinal lymph nodes and deep abdominal lymphatics as is customary for  non-cancer related lower extremity LE, including bilateral "short neck" sequence, J strokes to sub and supraclavicular LN, deep abdominal pathways, functional inguinal LN, lower extremity proximal to distal w/ emphasis on medial knee bottleneck and politeal LN. Performed fibrosis technique to B maleoli and distal  legs to address fatty fibrosis. Good tolerance.   Compression Bandaging LLE gradient compression wraps applied from toes to below knee: NO TOE WRAPS until nails cut. (toe wrap x1 under cotton stockinett); 8 cm x 5 m x 1 to foot and ankle, 10 cm  x 5 m x2, 12 cm x5 cm x 1, applied circumferentially in custommary layered gradient configuration  over .04 x 10 cm x 5 m Rosidol Soft x 1.5 .rolls.                  OT Education - 02/02/16 1156    Education provided Yes   Education Details Cont Pt and caregiver edu for proper gradient compression wrapping techniques, and commenced entry level Pt edu for simple self MLD.    Person(s) Educated Patient;Caregiver(s)   Methods Explanation;Demonstration;Handout   Comprehension Verbalized understanding;Need further instruction             OT Long Term Goals - 01/07/16 1333    OT LONG TERM GOAL #1   Title Lymphedema (LE) self-care: Pt able to correctly apply gradient compression wraps with maximum assistance from caregiver within 2 weeks for optimal limb volume reduction.   Baseline dependent   Time 2   Period Weeks   Status New   OT LONG TERM GOAL #2   Title Pt to achieve at least 10% BLE limb volume reductions during Intensive CDT to limit LE progression, decrease infection risk, to reduce foot and leg pain, and to improve ability to fit lower body clothing and shoes.   Baseline dependent   Time 12   Period Weeks   Status New   OT LONG TERM GOAL #3   Title Pt to achieve at least 10% BLE limb volume reductions during Intensive CDT to limit LE progression, decrease infection risk, to reduce foot and leg pain, and to improve ability to fit lower body clothing and shoes.   Baseline dependent   Time 12   Period Weeks   Status New   OT LONG TERM GOAL #4   Title Lymphedema (LE) self-care : Pt >/= 85 % compliant with all daily LE self-care protocols, including simple self-manual lymphatic drainage (MLD), skin care, lymphatic pumping therex, and donning/ doffing progression garments with needed level of caregiver assistance to limit LE progression, infection risk and further functional decline.     Baseline dependent   Time 12   Period Weeks   Status New   OT LONG TERM GOAL #5   Title Pt to tolerate daily compression wraps, garments and devices in keeping w/ prescribed wear regime within 1 week of issue date to progress and  retain clinical and functional gains and to limit LE progression.   Baseline dependent   Time 12   Period Weeks   Status New   Long Term Additional Goals   Additional Long Term Goals Yes               Plan - 02/02/16 1159    Clinical Impression Statement Incorrect gradient wrapping pushed foot swelling distally into dorsal foot and into toes. Caregivers a bit slow to learn techniques, so will continue to instruct and reinforce each session. Will encourage use of cell phone video for  reference between visits. LLE limb volume appears markedly reduced. Quick reference measurement at ~ B1 landmark reveals 9 cm reduction in circumference.  Skin hydration comtinues to improve each visit. Dendse fibrosis distally is unchanged in leg and foot. At [place] ( anterior ankle crease) tiny beads of blood noted today. Covered this area with bandaide before wrapping and stressed importance of careful skin monitoring and hygiene daily to limit infection. Pt demonstrates excellent progress towards most goals thus far.   Pt will benefit from skilled therapeutic intervention in order to improve on the following deficits (Retired) Decreased range of motion;Difficulty walking;Impaired flexibility;Increased edema;Decreased activity tolerance;Decreased knowledge of precautions;Decreased skin integrity;Decreased balance;Decreased knowledge of use of DME;Decreased scar mobility;Pain;Decreased mobility   Rehab Potential Fair   Clinical Impairments Affecting Rehab Potential Pt presents with decreased prognosis 2/2 overall health status w/ multiple comorbidities and history of non-compliance with previous  lymphedema treatment.   OT Frequency 3x / week   OT Duration 12 weeks        Problem List Patient Active Problem List   Diagnosis Date Noted  . Headache 06/22/2015  . Anemia, iron deficiency 04/05/2015  . Rectal benign neoplasm   . Asthma, chronic 04/04/2015  . history of Tubular adenoma of colon  04/04/2015  . 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 Hypokalemia 04/04/2015  . CKD (chronic kidney disease) stage 3, GFR 30-59 ml/min 04/03/2015  . Dehydration 04/03/2015  . Skin ulcer ([place])   . History of colonic polyps 12/08/2014  . Edema 04/14/2014  . Chronic systolic heart failure ([place]) 11/20/2013  . Nausea with vomiting 11/13/2013  . Lymphedema of lower extremity 10/17/2013  . 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 MGUS (monoclonal gammopathy of unknown significance) 05/15/2012  . VITAMIN D DEFICIENCY 01/29/2010  . BURSITIS, LEFT ELBOW 01/29/2010  . DERMATITIS 09/30/2009  . DIABETES MELLITUS, BORDERLINE 01/11/2009  . Paranoid delusion ([place]) 08/02/2008  . OBSTRUCTIVE CHRONIC BRONCHITIS 08/02/2008  . Essential hypertension 11/11/2007  . Venous (peripheral) insufficiency 11/11/2007  . RENAL CALCULUS 11/11/2007  . LOW BACK PAIN, CHRONIC 11/11/2007    Andrey Spearman, MS, OTR/L, [facility] 02/02/2016 12:08 PM   [place] MAIN [facility] SERVICES [address] [place], [place], [address] Phone: [phone]   Fax:  [phone]  Name: [name] MRN: [id number] Date of Birth: 03/04/1933

## 2016-02-03 ENCOUNTER — Ambulatory Visit (INDEPENDENT_AMBULATORY_CARE_PROVIDER_SITE_OTHER): Payer: Medicare Other | Admitting: Physician Assistant

## 2016-02-03 ENCOUNTER — Encounter: Payer: Self-pay | Admitting: Physician Assistant

## 2016-02-03 VITALS — BP 140/58 | HR 68 | Ht 63.0 in | Wt 148.8 lb

## 2016-02-03 DIAGNOSIS — N183 Chronic kidney disease, stage 3 unspecified: Secondary | ICD-10-CM

## 2016-02-03 DIAGNOSIS — I10 Essential (primary) hypertension: Secondary | ICD-10-CM

## 2016-02-03 DIAGNOSIS — I5042 Chronic combined systolic (congestive) and diastolic (congestive) heart failure: Secondary | ICD-10-CM

## 2016-02-03 DIAGNOSIS — R600 Localized edema: Secondary | ICD-10-CM

## 2016-02-03 NOTE — Patient Instructions (Addendum)
Medication Instructions:  No changes.  See your medication list.  Labwork: None today.   Testing/Procedures: None   Follow-Up: Dr. Daneen Schick 05/18/16 @ 3 PM   Any Other Special Instructions Will Be Listed Below (If Applicable).  If you need a refill on your cardiac medications before your next appointment, please call your pharmacy.

## 2016-02-04 ENCOUNTER — Encounter (HOSPITAL_BASED_OUTPATIENT_CLINIC_OR_DEPARTMENT_OTHER): Payer: Medicare Other | Attending: Internal Medicine

## 2016-02-04 ENCOUNTER — Ambulatory Visit: Payer: Medicare Other | Admitting: Occupational Therapy

## 2016-02-04 DIAGNOSIS — I890 Lymphedema, not elsewhere classified: Secondary | ICD-10-CM | POA: Diagnosis not present

## 2016-02-04 DIAGNOSIS — I87323 Chronic venous hypertension (idiopathic) with inflammation of bilateral lower extremity: Secondary | ICD-10-CM | POA: Insufficient documentation

## 2016-02-04 NOTE — Patient Instructions (Signed)
LE instructions and precautions as established- see initial eval.   

## 2016-02-04 NOTE — Therapy (Signed)
[place] MAIN [facility] SERVICES [address] [place], [place], [address] Phone: [phone]   Fax:  [phone]  Occupational Therapy Treatment  Patient Details  Name: [name] MRN: [id number] Date of Birth: [birth year] No Data Recorded  Encounter Date: 02/04/2016      OT End of Session - 02/04/16 1008    Visit Number 6   Number of Visits 36   Date for OT Re-Evaluation 04/02/16   OT Start Time 0925   OT Stop Time 1005   OT Time Calculation (min) 40 min   Equipment Utilized During Treatment LE Management & Self Care Workbook   Activity Tolerance Patient tolerated treatment well;No increased pain   Behavior During Therapy WFL for tasks assessed/performed  When asked re onset of LE Pt endorsed "people coming into my house and jamming glass into   my feet and legs ".       Past Medical History  Diagnosis Date  . Obstructive chronic bronchitis without exacerbation ([place])   . Unspecified essential hypertension   . Unspecified venous (peripheral) insufficiency   . Edema 2005    "since put the glass in them"   . Other abnormal glucose   . Lumbago   . Unspecified nonpsychotic mental disorder   . Unspecified venous (peripheral) insufficiency   . Unspecified vitamin D deficiency   . Anemia, unspecified   . Anxiety   . Arthritis   . Asthma   . Systolic heart failure (HCC)     EF 35 to 40% per echo January 2015  . Depression   . GERD (gastroesophageal reflux disease)     Past Surgical History  Procedure Laterality Date  . Cholecystectomy    . Tonsillectomy    . Cervical spine surgery      x 2  . Partial hysterectomy    . Lumbar disc surgery    . Esophagogastroduodenoscopy N/A 04/05/2015    Procedure: ESOPHAGOGASTRODUODENOSCOPY (EGD);  Surgeon: [name], MD;  Location: [facility];  Service: Endoscopy;  Laterality: N/A;  . Flexible sigmoidoscopy N/A 04/05/2015    Procedure: FLEXIBLE SIGMOIDOSCOPY;  Surgeon: [name],  MD;  Location: [facility];  Service: Endoscopy;  Laterality: N/A;    There were no vitals filed for this visit.      Subjective Assessment - 02/04/16 1227    Subjective  Pt is 24 minutes late for 60 minute OT visit # 5 to treat BLE lymphedema. Wraps are applied incorrectly again today.   Patient is accompained by: Family member   Pertinent History ith    Limitations Difficulty walking, decreased standing tolerance, impaired transfer ability, decreased ankle AROM, Pain in BLE, Inability to fit street shoes w/ increased falls risk, increased infection risk.   Patient Stated Goals reduce swelling and legs feel better   Currently in Pain? No/denies   Pain Onset More than a month ago             LYMPHEDEMA/ONCOLOGY QUESTIONNAIRE - 02/04/16 1230    Left Lower Extremity Lymphedema   Other LLE limb vulume below the knee (A-D) is decreased by 25.16% since initially measured on 01/25/15   Other LLE limb vulume from toes to groin (A-G) is decreased by 11.58 since initially measured on 3/28/1                 OT Treatments/Exercises (OP) - 02/04/16 0001    ADLs   ADL Education Given Yes   Manual Therapy   Manual  Therapy Edema management   Manual therapy comments LLE anatomical measurements for initial comparative limb volumetrics   Compression Bandaging LLE gradient compression wraps applied from toes to below knee: NO TOE WRAPS until nails cut. (toe wrap x1 under cotton stockinett); 8 cm x 5 m x 1 to foot and ankle, 10 cm  x 5 m x2, 12 cm x5 cm x 1, applied circumferentially in custommary layered gradient configuration  over .04 x 10 cm x 5 m Rosidol Soft x 1.5 .rolls.                 OT Education - 02/04/16 1228    Education provided Yes   Education Details Reviewed gradient compression wrap techniques and precautions again today. Caregivers wrote down steps in sequence   Person(s) Educated Patient;Caregiver(s)   Methods Explanation;Demonstration;Tactile cues;Verbal  cues;Handout   Comprehension Verbalized understanding;Need further instruction             OT Long Term Goals - 01/07/16 1333    OT LONG TERM GOAL #1   Title Lymphedema (LE) self-care: Pt able to correctly apply gradient compression wraps with maximum assistance from caregiver within 2 weeks for optimal limb volume reduction.   Baseline dependent   Time 2   Period Weeks   Status New   OT LONG TERM GOAL #2   Title Pt to achieve at least 10% BLE limb volume reductions during Intensive CDT to limit LE progression, decrease infection risk, to reduce foot and leg pain, and to improve ability to fit lower body clothing and shoes.   Baseline dependent   Time 12   Period Weeks   Status New   OT LONG TERM GOAL #3   Title Pt to achieve at least 10% BLE limb volume reductions during Intensive CDT to limit LE progression, decrease infection risk, to reduce foot and leg pain, and to improve ability to fit lower body clothing and shoes.   Baseline dependent   Time 12   Period Weeks   Status New   OT LONG TERM GOAL #4   Title Lymphedema (LE) self-care : Pt >/= 85 % compliant with all daily LE self-care protocols, including simple self-manual lymphatic drainage (MLD), skin care, lymphatic pumping therex, and donning/ doffing progression garments with needed level of caregiver assistance to limit LE progression, infection risk and further functional decline.     Baseline dependent   Time 12   Period Weeks   Status New   OT LONG TERM GOAL #5   Title Pt to tolerate daily compression wraps, garments and devices in keeping w/ prescribed wear regime within 1 week of issue date to progress and retain clinical and functional gains and to limit LE progression.   Baseline dependent   Time 12   Period Weeks   Status New   Long Term Additional Goals   Additional Long Term Goals Yes               Plan - 02/04/16 1233    Clinical Impression Statement Pt has met and exceeded 10% LLE volumetric  reduction goal today with volumetric reductions at both knee and thigh landmarks. Pt continues to demonstrate excellent progress, despit ongoing tardiness and incorrect garment application by caregivers.   Rehab Potential Fair   Clinical Impairments Affecting Rehab Potential Pt presents with decreased prognosis 2/2 overall health status w/ multiple comorbidities and history of non-compliance with previous  lymphedema treatment.   OT Frequency 3x / week   OT Duration  12 weeks      Patient will benefit from skilled therapeutic intervention in order to improve the following deficits and impairments:  Decreased range of motion, Difficulty walking, Impaired flexibility, Increased edema, Decreased activity tolerance, Decreased knowledge of precautions, Decreased skin integrity, Decreased balance, Decreased knowledge of use of DME, Decreased scar mobility, Pain, Decreased mobility  Visit Diagnosis: Lymphedema    Problem List Patient Active Problem List   Diagnosis Date Noted  . Headache 06/22/2015  . Anemia, iron deficiency 04/05/2015  . Rectal benign neoplasm   . Asthma, chronic 04/04/2015  . history of Tubular adenoma of colon 04/04/2015  . 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 Hypokalemia 04/04/2015  . CKD (chronic kidney disease) stage 3, GFR 30-59 ml/min 04/03/2015  . Dehydration 04/03/2015  . Skin ulcer ([place])   . History of colonic polyps 12/08/2014  . Edema 04/14/2014  . Chronic systolic heart failure ([place]) 11/20/2013  . Nausea with vomiting 11/13/2013  . Lymphedema of lower extremity 10/17/2013  . 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 MGUS (monoclonal gammopathy of unknown significance) 05/15/2012  . VITAMIN D DEFICIENCY 01/29/2010  . BURSITIS, LEFT ELBOW 01/29/2010  . DERMATITIS 09/30/2009  . DIABETES MELLITUS, BORDERLINE 01/11/2009  . Paranoid delusion ([place]) 08/02/2008  . OBSTRUCTIVE CHRONIC BRONCHITIS 08/02/2008  . Essential hypertension 11/11/2007  . Venous  (peripheral) insufficiency 11/11/2007  . RENAL CALCULUS 11/11/2007  . LOW BACK PAIN, CHRONIC 11/11/2007    Andrey Spearman, MS, OTR/L, [facility] 02/04/2016 12:36 PM  [place] MAIN [facility] SERVICES [address] [place], [place], [address] Phone: [phone]   Fax:  [phone]  Name: [name] MRN: [id number] Date of Birth: 1932/03/08

## 2016-02-07 ENCOUNTER — Ambulatory Visit: Payer: Medicare Other | Admitting: Occupational Therapy

## 2016-02-07 DIAGNOSIS — I890 Lymphedema, not elsewhere classified: Secondary | ICD-10-CM

## 2016-02-07 NOTE — Patient Instructions (Signed)
LE instructions and precautions as established- see initial eval.   

## 2016-02-07 NOTE — Therapy (Signed)
[place] MAIN [facility] SERVICES [address] [place], [place], [address] Phone: [phone]   Fax:  [phone]  Occupational Therapy Treatment  Patient Details  Name: [name] MRN: [id number] Date of Birth: 1931-12-03 No Data Recorded  Encounter Date: 02/07/2016      OT End of Session - 02/07/16 1005    Visit Number 7   Number of Visits 36   Date for OT Re-Evaluation 04/02/16   OT Start Time 0903   OT Stop Time 1003   OT Time Calculation (min) 60 min   Equipment Utilized During Treatment LE Management & Self Care Workbook   Activity Tolerance Patient tolerated treatment well;No increased pain   Behavior During Therapy WFL for tasks assessed/performed  When asked re onset of LE Pt endorsed "people coming into my house and jamming glass into   my feet and legs ".       Past Medical History  Diagnosis Date  . Obstructive chronic bronchitis without exacerbation ([place])   . Unspecified essential hypertension   . Unspecified venous (peripheral) insufficiency   . Edema 2005    "since put the glass in them"   . Other abnormal glucose   . Lumbago   . Unspecified nonpsychotic mental disorder   . Unspecified venous (peripheral) insufficiency   . Unspecified vitamin D deficiency   . Anemia, unspecified   . Anxiety   . Arthritis   . Asthma   . Systolic heart failure (HCC)     EF 35 to 40% per echo January 2015  . Depression   . GERD (gastroesophageal reflux disease)     Past Surgical History  Procedure Laterality Date  . Cholecystectomy    . Tonsillectomy    . Cervical spine surgery      x 2  . Partial hysterectomy    . Lumbar disc surgery    . Esophagogastroduodenoscopy N/A 04/05/2015    Procedure: ESOPHAGOGASTRODUODENOSCOPY (EGD);  Surgeon: [name], MD;  Location: [facility];  Service: Endoscopy;  Laterality: N/A;  . Flexible sigmoidoscopy N/A 04/05/2015    Procedure: FLEXIBLE SIGMOIDOSCOPY;  Surgeon: [name],  MD;  Location: [facility];  Service: Endoscopy;  Laterality: N/A;    There were no vitals filed for this visit.      Subjective Assessment - 02/07/16 1158    Subjective  Pt arrived on time for OT visit # 7 to treat BLE lymphedema. Wraps are applied nearly correctly again today. Pt denies any difficulty tolerating wraps and skin care over the weekend. Pt repeports that she is doeing her ther ex between  sessions as instructed. We'll review nxt visit.   Patient is accompained by: Family member   Pertinent History ith    Limitations Difficulty walking, decreased standing tolerance, impaired transfer ability, decreased ankle AROM, Pain in BLE, Inability to fit street shoes w/ increased falls risk, increased infection risk.   Patient Stated Goals reduce swelling and legs feel better   Currently in Pain? Yes   Pain Score --  not rated numerically   Pain Location Leg   Pain Orientation Left   Pain Descriptors / Indicators Pressure;Sore;Tightness   Pain Onset More than a month ago                      OT Treatments/Exercises (OP) - 02/07/16 0001    Manual Therapy   Manual Therapy Edema management;Manual Lymphatic Drainage (MLD);Compression Bandaging;Other (comment)  fibrosis technique all aspects  of L foot and malleoli   Edema Management skin care w/ low pH castor oil to LLE during MLD   Manual Lymphatic Drainage (MLD) Manual lymph drainage (MLD) to LLE in supine utilizing functional inguinal lymph nodes and deep abdominal lymphatics as is customary for non-cancer related lower extremity LE, including bilateral "short neck" sequence, J strokes to sub and supraclavicular LN, deep abdominal pathways, functional inguinal LN, lower extremity proximal to distal w/ emphasis on medial knee bottleneck and politeal LN. Performed fibrosis technique to B maleoli and distal  legs to address fatty fibrosis. Good tolerance.   Compression Bandaging LLE gradient compression wraps applied from  toes to below knee: NO TOE WRAPS until nails cut. (toe wrap x1 under cotton stockinett); 8 cm x 5 m x 1 to foot and ankle, 10 cm  x 5 m x2, 12 cm x5 cm x 1, applied circumferentially in custommary layered gradient configuration  over .04 x 10 cm x 5 m Rosidol Soft x 1.5 .rolls.                 OT Education - 02/07/16 1202    Education provided Yes   Education Details Emphasis of LE self care today on simple self MLD.    Person(s) Educated Patient;Caregiver(s)   Methods Explanation;Demonstration;Tactile cues;Verbal cues   Comprehension Returned demonstration;Verbal cues required;Tactile cues required;Need further instruction             OT Long Term Goals - 01/07/16 1333    OT LONG TERM GOAL #1   Title Lymphedema (LE) self-care: Pt able to correctly apply gradient compression wraps with maximum assistance from caregiver within 2 weeks for optimal limb volume reduction.   Baseline dependent   Time 2   Period Weeks   Status New   OT LONG TERM GOAL #2   Title Pt to achieve at least 10% BLE limb volume reductions during Intensive CDT to limit LE progression, decrease infection risk, to reduce foot and leg pain, and to improve ability to fit lower body clothing and shoes.   Baseline dependent   Time 12   Period Weeks   Status New   OT LONG TERM GOAL #3   Title Pt to achieve at least 10% BLE limb volume reductions during Intensive CDT to limit LE progression, decrease infection risk, to reduce foot and leg pain, and to improve ability to fit lower body clothing and shoes.   Baseline dependent   Time 12   Period Weeks   Status New   OT LONG TERM GOAL #4   Title Lymphedema (LE) self-care : Pt >/= 85 % compliant with all daily LE self-care protocols, including simple self-manual lymphatic drainage (MLD), skin care, lymphatic pumping therex, and donning/ doffing progression garments with needed level of caregiver assistance to limit LE progression, infection risk and further  functional decline.     Baseline dependent   Time 12   Period Weeks   Status New   OT LONG TERM GOAL #5   Title Pt to tolerate daily compression wraps, garments and devices in keeping w/ prescribed wear regime within 1 week of issue date to progress and retain clinical and functional gains and to limit LE progression.   Baseline dependent   Time 12   Period Weeks   Status New   Long Term Additional Goals   Additional Long Term Goals Yes               Plan - 02/07/16 1205  Clinical Impression Statement LLE well congested below knee ti distal leg. Dense thickened skin and fibrosis are very stubborn and are mostly unchanged after session. Skin hydration continues to improve with diligent skin care between sessions at home. Niece's gradient wrapping technique was much i,proved today with still some need for improvement. Pt tolerated fibrosis techniques, despite soreness. Next visit consider fabricating custom chip pads for dorsal L foot and ankles. Cont w/ Cowrap to distal foot and toes if effective after this visit.   Rehab Potential Fair   Clinical Impairments Affecting Rehab Potential Pt presents with decreased prognosis 2/2 overall health status w/ multiple comorbidities and history of non-compliance with previous  lymphedema treatment.   OT Frequency 3x / week   OT Duration 12 weeks      Patient will benefit from skilled therapeutic intervention in order to improve the following deficits and impairments:  Decreased range of motion, Difficulty walking, Impaired flexibility, Increased edema, Decreased activity tolerance, Decreased knowledge of precautions, Decreased skin integrity, Decreased balance, Decreased knowledge of use of DME, Decreased scar mobility, Pain, Decreased mobility  Visit Diagnosis: Lymphedema    Problem List Patient Active Problem List   Diagnosis Date Noted  . Headache 06/22/2015  . Anemia, iron deficiency 04/05/2015  . Rectal benign neoplasm   .  Asthma, chronic 04/04/2015  . history of Tubular adenoma of colon 04/04/2015  . 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 Hypokalemia 04/04/2015  . CKD (chronic kidney disease) stage 3, GFR 30-59 ml/min 04/03/2015  . Dehydration 04/03/2015  . Skin ulcer ([place])   . History of colonic polyps 12/08/2014  . Edema 04/14/2014  . Chronic systolic heart failure ([place]) 11/20/2013  . Nausea with vomiting 11/13/2013  . Lymphedema of lower extremity 10/17/2013  . 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 MGUS (monoclonal gammopathy of unknown significance) 05/15/2012  . VITAMIN D DEFICIENCY 01/29/2010  . BURSITIS, LEFT ELBOW 01/29/2010  . DERMATITIS 09/30/2009  . DIABETES MELLITUS, BORDERLINE 01/11/2009  . Paranoid delusion ([place]) 08/02/2008  . OBSTRUCTIVE CHRONIC BRONCHITIS 08/02/2008  . Essential hypertension 11/11/2007  . Venous (peripheral) insufficiency 11/11/2007  . RENAL CALCULUS 11/11/2007  . LOW BACK PAIN, CHRONIC 11/11/2007    Andrey Spearman, MS, OTR/L, [facility] 02/07/2016 12:10 PM   [place] MAIN [facility] SERVICES [address] [place], [place], [address] Phone: [phone]   Fax:  [phone]  Name: [name] MRN: [id number] Date of Birth: 1932/04/19

## 2016-02-08 ENCOUNTER — Other Ambulatory Visit (INDEPENDENT_AMBULATORY_CARE_PROVIDER_SITE_OTHER): Payer: Medicare Other

## 2016-02-08 ENCOUNTER — Encounter: Payer: Self-pay | Admitting: Pulmonary Disease

## 2016-02-08 ENCOUNTER — Ambulatory Visit (INDEPENDENT_AMBULATORY_CARE_PROVIDER_SITE_OTHER): Payer: Medicare Other | Admitting: Pulmonary Disease

## 2016-02-08 VITALS — HR 72 | Temp 98.4°F | Ht 62.0 in | Wt 149.4 lb

## 2016-02-08 DIAGNOSIS — I10 Essential (primary) hypertension: Secondary | ICD-10-CM

## 2016-02-08 DIAGNOSIS — I872 Venous insufficiency (chronic) (peripheral): Secondary | ICD-10-CM

## 2016-02-08 DIAGNOSIS — D509 Iron deficiency anemia, unspecified: Secondary | ICD-10-CM

## 2016-02-08 DIAGNOSIS — M545 Low back pain, unspecified: Secondary | ICD-10-CM

## 2016-02-08 DIAGNOSIS — F22 Delusional disorders: Secondary | ICD-10-CM

## 2016-02-08 DIAGNOSIS — J449 Chronic obstructive pulmonary disease, unspecified: Secondary | ICD-10-CM

## 2016-02-08 DIAGNOSIS — D472 Monoclonal gammopathy: Secondary | ICD-10-CM

## 2016-02-08 DIAGNOSIS — I890 Lymphedema, not elsewhere classified: Secondary | ICD-10-CM | POA: Diagnosis not present

## 2016-02-08 DIAGNOSIS — J4489 Other specified chronic obstructive pulmonary disease: Secondary | ICD-10-CM

## 2016-02-08 DIAGNOSIS — I5022 Chronic systolic (congestive) heart failure: Secondary | ICD-10-CM | POA: Diagnosis not present

## 2016-02-08 LAB — COMPREHENSIVE METABOLIC PANEL
ALBUMIN: 4.2 g/dL (ref 3.5–5.2)
ALK PHOS: 72 U/L (ref 39–117)
ALT: 13 U/L (ref 0–35)
AST: 18 U/L (ref 0–37)
BILIRUBIN TOTAL: 0.4 mg/dL (ref 0.2–1.2)
BUN: 29 mg/dL — AB (ref 6–23)
CO2: 31 mEq/L (ref 19–32)
CREATININE: 1.47 mg/dL — AB (ref 0.40–1.20)
Calcium: 10.1 mg/dL (ref 8.4–10.5)
Chloride: 101 mEq/L (ref 96–112)
GFR: 43.77 mL/min — ABNORMAL LOW (ref >60.00)
GLUCOSE: 92 mg/dL (ref 70–99)
Potassium: 3.4 mEq/L — ABNORMAL LOW (ref 3.5–5.1)
SODIUM: 139 meq/L (ref 135–145)
TOTAL PROTEIN: 8.8 g/dL — AB (ref 6.0–8.3)

## 2016-02-08 LAB — CBC WITH DIFFERENTIAL/PLATELET
BASOS ABS: 0 10*3/uL (ref 0.0–0.1)
Basophils Relative: 0.2 % (ref 0.0–3.0)
EOS PCT: 4.7 % (ref 0.0–5.0)
Eosinophils Absolute: 0.3 10*3/uL (ref 0.0–0.7)
HEMATOCRIT: 33.1 % — AB (ref 36.0–46.0)
Hemoglobin: 10.9 g/dL — ABNORMAL LOW (ref 12.0–15.0)
LYMPHS ABS: 1.2 10*3/uL (ref 0.7–4.0)
LYMPHS PCT: 22.9 % (ref 12.0–46.0)
MCHC: 33 g/dL (ref 30.0–36.0)
MCV: 83.9 fl (ref 78.0–100.0)
MONOS PCT: 10.3 % (ref 3.0–12.0)
Monocytes Absolute: 0.6 10*3/uL (ref 0.1–1.0)
NEUTROS ABS: 3.4 10*3/uL (ref 1.4–7.7)
NEUTROS PCT: 61.9 % (ref 43.0–77.0)
PLATELETS: 252 10*3/uL (ref 150.0–400.0)
RBC: 3.95 Mil/uL (ref 3.87–5.11)
RDW: 15.9 % — ABNORMAL HIGH (ref 11.5–15.5)
WBC: 5.4 10*3/uL (ref 4.0–10.5)

## 2016-02-08 LAB — FERRITIN: Ferritin: 413.8 ng/mL — ABNORMAL HIGH (ref 10.0–291.0)

## 2016-02-08 LAB — IBC PANEL
Iron: 66 ug/dL (ref 42–145)
Saturation Ratios: 22.7 % (ref 20.0–50.0)
TRANSFERRIN: 208 mg/dL — AB (ref 212.0–360.0)

## 2016-02-08 LAB — TSH: TSH: 0.69 u[IU]/mL (ref 0.35–4.50)

## 2016-02-08 LAB — BRAIN NATRIURETIC PEPTIDE: PRO B NATRI PEPTIDE: 66 pg/mL (ref 0.0–100.0)

## 2016-02-08 MED ORDER — ALPRAZOLAM 0.5 MG PO TABS: ORAL_TABLET | ORAL | Status: DC

## 2016-02-08 NOTE — Patient Instructions (Signed)
Today we updated your med list in our EPIC system...    Continue your current medications the same...    We refilled your Xanax per request...  Congratulations on your clinical improvement!  Today we rechecked some blood work...    We will contact you w/ the results when available...   For the constipation from the Iron therapy >>     You may try changing the Iron to [facility] one tab twice daily...    Or you may continue the current Iron pills and ADD a laxative regimen >>        Try MIRALAX one capful in water daily...        And SENAKOT-S one to two caps at bedtime...  Call for any questions...  Let's plan a follow up visit in 52mo, sooner if needed for problems.Marland KitchenMarland Kitchen

## 2016-02-09 ENCOUNTER — Ambulatory Visit: Payer: Medicare Other | Admitting: Occupational Therapy

## 2016-02-09 DIAGNOSIS — I890 Lymphedema, not elsewhere classified: Secondary | ICD-10-CM | POA: Diagnosis not present

## 2016-02-09 LAB — IGG, IGA, IGM
IGA: 408 mg/dL — AB (ref 69–380)
IgG (Immunoglobin G), Serum: 2600 mg/dL — ABNORMAL HIGH (ref 690–1700)
IgM, Serum: 65 mg/dL (ref 52–322)

## 2016-02-09 NOTE — Patient Instructions (Signed)
LE instructions and precautions as established- see initial eval.   

## 2016-02-09 NOTE — Therapy (Signed)
[place] MAIN [facility] SERVICES [address] [place], [place], [address] Phone: [phone]   Fax:  [phone]  Occupational Therapy Treatment  Patient Details  Name: [name] MRN: [id number] Date of Birth: [birth year] No Data Recorded  Encounter Date: 02/09/2016      OT End of Session - 02/09/16 1017    Visit Number 8   Number of Visits 36   Date for OT Re-Evaluation 04/02/16   OT Start Time 0919   OT Stop Time 1010   OT Time Calculation (min) 51 min   Equipment Utilized During Treatment LE Management & Self Care Workbook   Activity Tolerance Patient tolerated treatment well;No increased pain   Behavior During Therapy WFL for tasks assessed/performed  When asked re onset of LE Pt endorsed "people coming into my house and jamming glass into   my feet and legs ".       Past Medical History  Diagnosis Date  . Obstructive chronic bronchitis without exacerbation ([place])   . Unspecified essential hypertension   . Unspecified venous (peripheral) insufficiency   . Edema 2005    "since put the glass in them"   . Other abnormal glucose   . Lumbago   . Unspecified nonpsychotic mental disorder   . Unspecified venous (peripheral) insufficiency   . Unspecified vitamin D deficiency   . Anemia, unspecified   . Anxiety   . Arthritis   . Asthma   . Systolic heart failure (HCC)     EF 35 to 40% per echo January 2015  . Depression   . GERD (gastroesophageal reflux disease)     Past Surgical History  Procedure Laterality Date  . Cholecystectomy    . Tonsillectomy    . Cervical spine surgery      x 2  . Partial hysterectomy    . Lumbar disc surgery    . Esophagogastroduodenoscopy N/A 04/05/2015    Procedure: ESOPHAGOGASTRODUODENOSCOPY (EGD);  Surgeon: [name], MD;  Location: [facility];  Service: Endoscopy;  Laterality: N/A;  . Flexible sigmoidoscopy N/A 04/05/2015    Procedure: FLEXIBLE SIGMOIDOSCOPY;  Surgeon: [name],  MD;  Location: [facility];  Service: Endoscopy;  Laterality: N/A;    There were no vitals filed for this visit.      Subjective Assessment - 02/09/16 1012    Subjective  Pt arrives 20 min late for OT visit # 8 to treat BLE lymphedema. Pt reports referring physician was pleased with her progress thus far when she saw him earlier in the week. Pt cont to c/o soreness at anterior aL ankle. Reminded Pt to  dorsiflex foot as far as possible to reduce wrap tightness in this area.   Patient is accompained by: Family member   Pertinent History ith    Limitations Difficulty walking, decreased standing tolerance, impaired transfer ability, decreased ankle AROM, Pain in BLE, Inability to fit street shoes w/ increased falls risk, increased infection risk.   Patient Stated Goals reduce swelling and legs feel better   Currently in Pain? No/denies   Pain Onset More than a month ago                      OT Treatments/Exercises (OP) - 02/09/16 0001    ADLs   ADL Education Given Yes   Manual Therapy   Manual Therapy Edema management;Other (comment)  No MLD today 2/2 time constrAINTS   Edema Management skin care w/ low pH eUCERIN  LOTION PRIOR TO APPLYING WRAPS.   Compression Bandaging LLE gradient compression wraps applied from toes to below knee: NO TOE WRAPS until nails cut. (toe wrap x1 under cotton stockinett); 8 cm x 5 m x 1 to foot and ankle, 10 cm  x 5 m x2, 12 cm x5 cm x 1, applied circumferentially in custommary layered gradient configuration  over .04 x 10 cm x 5 m Rosidol Soft x 1.5 .rolls.                 OT Education - 02/09/16 1016    Education provided Yes   Education Details Continued skilled Pt/caregiver Education  And LE ADL training throughout visit for lymphedema self care, including compression wrapping, compression garment and device wear/care, lymphatic pumping ther ex, simple self-MLD, and skin care. Discussed progress towards goals.   Person(s) Educated  Patient;Caregiver(s)   Methods Explanation;Demonstration;Tactile cues;Verbal cues;Handout   Comprehension Verbalized understanding;Returned demonstration;Verbal cues required;Tactile cues required;Need further instruction             OT Long Term Goals - 01/07/16 1333    OT LONG TERM GOAL #1   Title Lymphedema (LE) self-care: Pt able to correctly apply gradient compression wraps with maximum assistance from caregiver within 2 weeks for optimal limb volume reduction.   Baseline dependent   Time 2   Period Weeks   Status New   OT LONG TERM GOAL #2   Title Pt to achieve at least 10% BLE limb volume reductions during Intensive CDT to limit LE progression, decrease infection risk, to reduce foot and leg pain, and to improve ability to fit lower body clothing and shoes.   Baseline dependent   Time 12   Period Weeks   Status New   OT LONG TERM GOAL #3   Title Pt to achieve at least 10% BLE limb volume reductions during Intensive CDT to limit LE progression, decrease infection risk, to reduce foot and leg pain, and to improve ability to fit lower body clothing and shoes.   Baseline dependent   Time 12   Period Weeks   Status New   OT LONG TERM GOAL #4   Title Lymphedema (LE) self-care : Pt >/= 85 % compliant with all daily LE self-care protocols, including simple self-manual lymphatic drainage (MLD), skin care, lymphatic pumping therex, and donning/ doffing progression garments with needed level of caregiver assistance to limit LE progression, infection risk and further functional decline.     Baseline dependent   Time 12   Period Weeks   Status New   OT LONG TERM GOAL #5   Title Pt to tolerate daily compression wraps, garments and devices in keeping w/ prescribed wear regime within 1 week of issue date to progress and retain clinical and functional gains and to limit LE progression.   Baseline dependent   Time 12   Period Weeks   Status New   Long Term Additional Goals    Additional Long Term Goals Yes               Plan - 02/09/16 1018    Clinical Impression Statement Pt's LLE limb volume continues to decrease with CDT. Skin hydratiuon continues to improve and flaking is slacking off. Pt experiences noticable hystamine reaction when wraps are initally removed, which resolves within a few monutes. . Pt demonstrates excellent tolerance for this challenging treatment, compared with patients of much youner years.    Rehab Potential Fair   Clinical Impairments Affecting Rehab Potential  Pt presents with decreased prognosis 2/2 overall health status w/ multiple comorbidities and history of non-compliance with previous  lymphedema treatment.   OT Frequency 3x / week   OT Duration 12 weeks      Patient will benefit from skilled therapeutic intervention in order to improve the following deficits and impairments:  Decreased range of motion, Difficulty walking, Impaired flexibility, Increased edema, Decreased activity tolerance, Decreased knowledge of precautions, Decreased skin integrity, Decreased balance, Decreased knowledge of use of DME, Decreased scar mobility, Pain, Decreased mobility  Visit Diagnosis: Lymphedema    Problem List Patient Active Problem List   Diagnosis Date Noted  . Headache 06/22/2015  . Anemia, iron deficiency 04/05/2015  . Rectal benign neoplasm   . Asthma, chronic 04/04/2015  . history of Tubular adenoma of colon 04/04/2015  . 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 Hypokalemia 04/04/2015  . CKD (chronic kidney disease) stage 3, GFR 30-59 ml/min 04/03/2015  . Dehydration 04/03/2015  . Skin ulcer ([place])   . History of colonic polyps 12/08/2014  . Edema 04/14/2014  . Chronic systolic heart failure ([place]) 11/20/2013  . Nausea with vomiting 11/13/2013  . Lymphedema of lower extremity 10/17/2013  . 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 MGUS (monoclonal gammopathy of unknown significance) 05/15/2012  . VITAMIN D DEFICIENCY  01/29/2010  . BURSITIS, LEFT ELBOW 01/29/2010  . DERMATITIS 09/30/2009  . DIABETES MELLITUS, BORDERLINE 01/11/2009  . Paranoid delusion ([place]) 08/02/2008  . OBSTRUCTIVE CHRONIC BRONCHITIS 08/02/2008  . Essential hypertension 11/11/2007  . Venous (peripheral) insufficiency 11/11/2007  . RENAL CALCULUS 11/11/2007  . LOW BACK PAIN, CHRONIC 11/11/2007    Andrey Spearman, MS, OTR/L, [facility] 02/09/2016 3:35 PM  [place] MAIN [facility] SERVICES [address] [place], [place], [address] Phone: [phone]   Fax:  [phone]  Name: [name] MRN: [id number] Date of Birth: 1932-08-13

## 2016-02-11 ENCOUNTER — Ambulatory Visit: Payer: Self-pay | Admitting: Pulmonary Disease

## 2016-02-11 ENCOUNTER — Ambulatory Visit: Payer: Medicare Other | Admitting: Occupational Therapy

## 2016-02-11 DIAGNOSIS — I87323 Chronic venous hypertension (idiopathic) with inflammation of bilateral lower extremity: Secondary | ICD-10-CM | POA: Diagnosis not present

## 2016-02-11 DIAGNOSIS — I890 Lymphedema, not elsewhere classified: Secondary | ICD-10-CM | POA: Diagnosis not present

## 2016-02-11 LAB — PROTEIN ELECTROPHORESIS, SERUM, WITH REFLEX
ALBUMIN ELP: 4.3 g/dL (ref 3.8–4.8)
ALPHA-1-GLOBULIN: 0.3 g/dL (ref 0.2–0.3)
Alpha-2-Globulin: 0.9 g/dL (ref 0.5–0.9)
Beta 2: 0.5 g/dL (ref 0.2–0.5)
Beta Globulin: 0.4 g/dL (ref 0.4–0.6)
GAMMA GLOBULIN: 2.4 g/dL — AB (ref 0.8–1.7)
TOTAL PROTEIN, SERUM ELECTROPHOR: 8.8 g/dL — AB (ref 6.1–8.1)

## 2016-02-14 ENCOUNTER — Ambulatory Visit (HOSPITAL_COMMUNITY)
Admission: RE | Admit: 2016-02-14 | Discharge: 2016-02-14 | Disposition: A | Discharge status: Home / Self Care | Payer: Medicare Other | Source: Ambulatory Visit | Attending: Internal Medicine | Admitting: Internal Medicine

## 2016-02-14 ENCOUNTER — Ambulatory Visit: Payer: Medicare Other | Admitting: Occupational Therapy

## 2016-02-14 ENCOUNTER — Other Ambulatory Visit: Payer: Self-pay | Admitting: Internal Medicine

## 2016-02-14 DIAGNOSIS — I890 Lymphedema, not elsewhere classified: Secondary | ICD-10-CM | POA: Diagnosis not present

## 2016-02-14 DIAGNOSIS — R609 Edema, unspecified: Secondary | ICD-10-CM

## 2016-02-14 DIAGNOSIS — L97821 Non-pressure chronic ulcer of other part of left lower leg limited to breakdown of skin: Secondary | ICD-10-CM | POA: Diagnosis not present

## 2016-02-14 DIAGNOSIS — I87323 Chronic venous hypertension (idiopathic) with inflammation of bilateral lower extremity: Secondary | ICD-10-CM | POA: Diagnosis not present

## 2016-02-14 DIAGNOSIS — I87333 Chronic venous hypertension (idiopathic) with ulcer and inflammation of bilateral lower extremity: Secondary | ICD-10-CM | POA: Diagnosis not present

## 2016-02-14 DIAGNOSIS — F99 Mental disorder, not otherwise specified: Secondary | ICD-10-CM | POA: Diagnosis not present

## 2016-02-14 DIAGNOSIS — K219 Gastro-esophageal reflux disease without esophagitis: Secondary | ICD-10-CM | POA: Insufficient documentation

## 2016-02-14 DIAGNOSIS — J449 Chronic obstructive pulmonary disease, unspecified: Secondary | ICD-10-CM | POA: Diagnosis not present

## 2016-02-14 DIAGNOSIS — I110 Hypertensive heart disease with heart failure: Secondary | ICD-10-CM | POA: Diagnosis not present

## 2016-02-14 DIAGNOSIS — I5020 Unspecified systolic (congestive) heart failure: Secondary | ICD-10-CM | POA: Insufficient documentation
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
## 2016-02-14 NOTE — Progress Notes (Signed)
VASCULAR LAB PRELIMINARY  PRELIMINARY  PRELIMINARY  PRELIMINARY  Left lower extremity venous duplex completed.    Preliminary report:  Left:  No evidence of DVT, superficial thrombosis, or Baker's cyst.  Peri Kreft, RVS 02/14/2016, 2:55 PM

## 2016-02-14 NOTE — Patient Instructions (Signed)
New  02/14/16  1. To limit progression and infection risk, call PCP and ask for local wound care referral ASAP.  2. Once wounds are closed and wound care provider/s send new referral w/ medical release, we can resume Complete Decongestive Therapy for LE care.  3. Continue LE there ex but discontinue LE wrapping until wounds are healed.  4. Elevate legs when sitting.  As established:  Lymphedema Precautions  If you experience atypical shortness of breath, or notice any signs /symptoms of skin infection (aka cellulitis) remove all compression wraps/ garments, discontinue manual lymphatic drainage (MLD), and report symptoms to your physician immediately. Discontinue MLD and compression for 72 hours after you take your first oral antibiotic so not to spread the infection.   Lymphedema Self- Care Instructions  1. EXERCISE: Perform lymphatic pumping there ex 2 x a day. While wearing your compression wraps or garments. Perform 10 reps of each exercise bilaterally and be sure to perform them in order. Don;t skip around!  OMIT PARTIAL SIT UP  2. MLD: Perform simple self-Manual Lymphatic Drainage (MLD) at least once a day as directed.  3. WRAPS: Compression wraps are to be worn 23 hrs/ 7 days/wk during Intensive Phase of Complete Decongestive Therapy (CDT).Building tolerance may take time and practice, so don't get discouraged. If bandages begin to feel tight during periods of inactivity and/or during the night, try performing your exercises to loosen them.   4. GARMENTS: During Management Phase CDT your compression garments are to be worn during waking hours when active. Do NOT sleep in your garments!!   5. PUT YOUR FEET UP! Elevate your feet and legs and feet to the level of your heart whenever you are sitting down.   6. SKIN: Carefully monitor skin condition and perform impeccable hygiene daily. Bathe skin with mild soap and water and apply low pH lotion (aka Eucerin ) to improve hydration and  limit infection risk.    Lymphatic Pumping Exercises:

## 2016-02-15 ENCOUNTER — Telehealth: Payer: Self-pay | Admitting: Pulmonary Disease

## 2016-02-15 DIAGNOSIS — L909 Atrophic disorder of skin, unspecified: Secondary | ICD-10-CM

## 2016-02-15 DIAGNOSIS — R238 Other skin changes: Secondary | ICD-10-CM

## 2016-02-15 DIAGNOSIS — I890 Lymphedema, not elsewhere classified: Secondary | ICD-10-CM

## 2016-02-15 NOTE — Telephone Encounter (Signed)
Per SN: Need STAT referral to wound care clinic, pt with lymphedema now with LLE breakdown, needs ASAP appt.   Called and spoke to pt. Informed her of the referral. Pt verbalized understanding and denied any further questions or concerns at this time.

## 2016-02-16 ENCOUNTER — Ambulatory Visit: Payer: Medicare Other | Admitting: Occupational Therapy

## 2016-02-18 ENCOUNTER — Ambulatory Visit: Payer: Medicare Other | Admitting: Occupational Therapy

## 2016-02-18 DIAGNOSIS — I87323 Chronic venous hypertension (idiopathic) with inflammation of bilateral lower extremity: Secondary | ICD-10-CM | POA: Diagnosis not present

## 2016-02-18 DIAGNOSIS — I890 Lymphedema, not elsewhere classified: Secondary | ICD-10-CM | POA: Diagnosis not present

## 2016-02-21 ENCOUNTER — Ambulatory Visit: Payer: Medicare Other | Admitting: Occupational Therapy

## 2016-02-22 ENCOUNTER — Ambulatory Visit: Payer: Medicare Other | Admitting: Sports Medicine

## 2016-02-23 ENCOUNTER — Ambulatory Visit: Payer: Medicare Other | Admitting: Occupational Therapy

## 2016-02-25 ENCOUNTER — Ambulatory Visit: Payer: Medicare Other | Admitting: Occupational Therapy

## 2016-02-25 DIAGNOSIS — I890 Lymphedema, not elsewhere classified: Secondary | ICD-10-CM | POA: Diagnosis not present

## 2016-02-25 DIAGNOSIS — I87323 Chronic venous hypertension (idiopathic) with inflammation of bilateral lower extremity: Secondary | ICD-10-CM | POA: Diagnosis not present

## 2016-02-28 ENCOUNTER — Ambulatory Visit: Payer: Medicare Other | Attending: Cardiology | Admitting: Occupational Therapy

## 2016-03-01 ENCOUNTER — Ambulatory Visit: Payer: Medicare Other | Admitting: Occupational Therapy

## 2016-03-03 ENCOUNTER — Ambulatory Visit: Payer: Medicare Other | Admitting: Occupational Therapy

## 2016-03-06 ENCOUNTER — Ambulatory Visit: Payer: Medicare Other | Admitting: Occupational Therapy

## 2016-03-08 ENCOUNTER — Ambulatory Visit: Payer: Medicare Other | Admitting: Occupational Therapy

## 2016-03-09 ENCOUNTER — Ambulatory Visit: Payer: Medicare Other | Admitting: Neurology

## 2016-03-09 DIAGNOSIS — Z029 Encounter for administrative examinations, unspecified: Secondary | ICD-10-CM

## 2016-03-10 ENCOUNTER — Ambulatory Visit: Payer: Medicare Other | Admitting: Occupational Therapy

## 2016-03-13 ENCOUNTER — Ambulatory Visit: Payer: Medicare Other | Admitting: Occupational Therapy

## 2016-03-15 ENCOUNTER — Ambulatory Visit: Payer: Medicare Other | Admitting: Occupational Therapy

## 2016-03-17 ENCOUNTER — Ambulatory Visit: Payer: Medicare Other | Admitting: Occupational Therapy

## 2016-03-20 ENCOUNTER — Ambulatory Visit: Payer: Medicare Other | Admitting: Occupational Therapy

## 2016-03-20 ENCOUNTER — Telehealth: Payer: Self-pay | Admitting: Interventional Cardiology

## 2016-03-20 NOTE — Telephone Encounter (Signed)
Returned call to Teresa,OT. Adv her office that rqst for lymphoma therapy should go to the patients pcp.

## 2016-03-20 NOTE — Telephone Encounter (Signed)
NEW:  Pt has finished with wound care clinic and needs an order to continue the Lymphedema Clinic.  Please fax the order to :  [phone] attention [employer]ph[employer].

## 2016-03-20 NOTE — Telephone Encounter (Signed)
New message    Occupational therapist, needs Verbal order for lymphoma therapy, pt is waiting there with the therapist now.  Per Therapist if someone could call back before 10am.

## 2016-03-20 NOTE — Telephone Encounter (Signed)
Expand All Collapse All   Returned call to Teresa,OT. Adv her office that rqst for lymphoma therapy should go to the patients pcp.

## 2016-03-21 ENCOUNTER — Telehealth: Payer: Self-pay | Admitting: Pulmonary Disease

## 2016-03-21 NOTE — Telephone Encounter (Signed)
Spoke with pt.  Pt states she has completed treatment with the [facility] at [facility] d/t open leg wounds and now needs a new referral to the Lymphedema Clinic at [facility].    Per chart, initial referral was ordered by Cecilie Kicks, NP.  Spoke with Debbie with [facility] Lymphedema clinic.  Was advised referral does need to come from Cecilie Kicks, NP's office.  Spoke with pt.  She will call cardiology for referral.  Nothing further needed from our office at this time.

## 2016-03-22 ENCOUNTER — Ambulatory Visit: Payer: Medicare Other | Admitting: Occupational Therapy

## 2016-03-24 ENCOUNTER — Ambulatory Visit: Payer: Medicare Other | Admitting: Occupational Therapy

## 2016-03-24 NOTE — Telephone Encounter (Signed)
Ok to re-order- for 12 weeks.

## 2016-03-24 NOTE — Telephone Encounter (Signed)
Follow Up  Pt called to follow up on Lymphedema therapy. Request a call back to discuss how far the office is in the process of getting this completed. Please call

## 2016-03-24 NOTE — Telephone Encounter (Signed)
Spoke with Helene Kelp OT and she states that she plans to re-evaluate pt and make a new plan of care and extend OT for another 12 weeks.   Original referral was made by Cecilie Kicks, NP. Helene Kelp needs new orders for pt to continue OT. Will forward to Dr. [name] and Cecilie Kicks, NP for review and advisement.

## 2016-03-24 NOTE — Telephone Encounter (Signed)
Attempted to call OT but no answer.  Will try again later. Spoke with pt and informed her that Cecilie Kicks, NP said ok to reorder for 12 weeks.   Pt verbalized understanding and was appreciative for call back.

## 2016-03-24 NOTE — Telephone Encounter (Signed)
Follow Up   [name] OT , Is calling to get orders to resume therapy for [name] Lymphedema therapy , Stating that Dr. [name] and Cecilie Kicks NP were the org inial referral source . Please call   Thanks

## 2016-03-24 NOTE — Telephone Encounter (Signed)
Spoke with someone at OT and she stated that [name] had left for the day. Verified fax number and advised that I will fax orders over.

## 2016-03-29 ENCOUNTER — Ambulatory Visit: Payer: Medicare Other | Admitting: Occupational Therapy

## 2016-03-29 ENCOUNTER — Telehealth: Payer: Self-pay | Admitting: Interventional Cardiology

## 2016-03-29 NOTE — Telephone Encounter (Signed)
Order signed by Cecilie Kicks, NP.  Attempted to call Clarene Critchley back to obtain fax number but no answer.  Will try again later.

## 2016-03-29 NOTE — Telephone Encounter (Signed)
Called and obtained fax number. Orders faxed over.

## 2016-03-29 NOTE — Telephone Encounter (Signed)
New message     [name] at rehab services for the pt needs a actual order saying "to resume occupational therapy for lymphedema for eval and treat" please sent to [name] at the rehab services.

## 2016-03-31 ENCOUNTER — Ambulatory Visit: Payer: Medicare Other | Admitting: Occupational Therapy

## 2016-04-03 ENCOUNTER — Ambulatory Visit: Payer: Medicare Other | Admitting: Occupational Therapy

## 2016-04-03 DIAGNOSIS — I890 Lymphedema, not elsewhere classified: Secondary | ICD-10-CM | POA: Insufficient documentation

## 2016-04-05 ENCOUNTER — Ambulatory Visit: Payer: Medicare Other | Attending: Cardiology | Admitting: Occupational Therapy

## 2016-04-05 DIAGNOSIS — I890 Lymphedema, not elsewhere classified: Secondary | ICD-10-CM

## 2016-04-05 NOTE — Therapy (Signed)
[place] MAIN [facility] SERVICES [address] [place], [place], [address] Phone: [phone]   Fax:  [phone]  Occupational Therapy Evaluation & Treatment  Patient Details  Name: [name] MRN: [id number] Date of Birth: November 09, 1931 Referring Provider: Cecilie Kicks, NP for Dr Daneen Schick at [facility]  Encounter Date: 04/05/2016      OT End of Session - 04/05/16 1404    Visit Number (p) 1   Number of Visits (p) 36   Date for OT Re-Evaluation (p) 07/04/16   OT Start Time (p) 0905   OT Stop Time (p) 1009   OT Time Calculation (min) (p) 64 min   Equipment Utilized During Treatment (p) LE Management & Self Care Workbook   Activity Tolerance (p) Patient tolerated treatment well;No increased pain;Treatment limited secondary to medical complications (Comment);Other (comment)   Behavior During Therapy (p) WFL for tasks assessed/performed      Past Medical History  Diagnosis Date  . Obstructive chronic bronchitis without exacerbation ([place])   . Unspecified essential hypertension   . Unspecified venous (peripheral) insufficiency   . Edema 2005    "since put the glass in them"   . Other abnormal glucose   . Lumbago   . Unspecified nonpsychotic mental disorder   . Unspecified venous (peripheral) insufficiency   . Unspecified vitamin D deficiency   . Anemia, unspecified   . Anxiety   . Arthritis   . Asthma   . Systolic heart failure (HCC)     EF 35 to 40% per echo January 2015  . Depression   . GERD (gastroesophageal reflux disease)     Past Surgical History  Procedure Laterality Date  . Cholecystectomy    . Tonsillectomy    . Cervical spine surgery      x 2  . Partial hysterectomy    . Lumbar disc surgery    . Esophagogastroduodenoscopy N/A 04/05/2015    Procedure: ESOPHAGOGASTRODUODENOSCOPY (EGD);  Surgeon: [name], MD;  Location: [facility];  Service: Endoscopy;  Laterality: N/A;  . Flexible sigmoidoscopy N/A  04/05/2015    Procedure: FLEXIBLE SIGMOIDOSCOPY;  Surgeon: [name], MD;  Location: [facility];  Service: Endoscopy;  Laterality: N/A;    There were no vitals filed for this visit.      Subjective Assessment - 04/05/16 1322    Subjective  Pt returns to OT for lymphedema care today after previous course of Complete Decongestive Therapy was interrupted by episode of LLE skin breakdown w/ open wounds on 02/14/16. Pt had commenced CDT on 01/05/16 and had completed 9 visits with good progress towards goals. Medical record  reveals pt underwent Doppler ultrasound on 02/14/16 and DVT was ruled out. [facility]. Pt tells me she completed 6 visits there in [place] during OT Rx interval.   Patient is accompained by: Family member   Pertinent History Chronic bronchitis, CVI, anxiety , systoloc hert failure, asthma, arthritis, multiple spinal surgeries w/ chronic back pain   Limitations Difficulty walking, decreased standing tolerance, impaired transfer ability, decreased ankle AROM, Pain in BLE, Inability to fit street shoes w/ increased falls risk, increased infection risk.   Patient Stated Goals reduce swelling and legs feel better   Pain Score 7    Pain Location Leg   Pain Orientation Right;Left   Pain Descriptors / Indicators Aching;Burning;Constant;Heaviness;Numbness;Pins and needles;Pressure;Sore;Tender;Throbbing;Tightness;Tingling;Tiring   Pain Type Intractable pain   Pain Onset More than a month ago  Aggravating Factors  standing, walking,    Pain Relieving Factors elevation, compression   Effect of Pain on Daily Activities limits self care, instrumental ADLs including shopping, cooking and home management, participation social activities in community           [facility] OT Assessment - 04/05/16 0001    Assessment   Diagnosis BLE moderate stage II-III LE 2/2 CVI   Referring Provider Cecilie Kicks, NP for Dr Daneen Schick at [facility]   Onset  Date 06/05/05   Prior Therapy Completed  9 of 36 scheduled visits with this OT for Complete Decongestive Therapy. Emphasis on the LLE with very positive reduction in swelling and improved function. CDT intrupted by episode of skin breakdown w LLE wounds.   Precautions   Precautions Fall;Other (comment)  LE precautions for Skin, Respiratory, Cardiac, and Falls   Prior Function   Level of Independence Independent with household mobility with device;Independent with community mobility with device;Independent with transfers;Requires assistive device for independence;Needs assistance with ADLs   Comments No assistance available for self care. Pt requires assistance with all instrumental ADLs and transportation   Observation/Other Assessments   Observations No matching choice found.   Skin Integrity Legs below knees are scarred w/ redness and  hemosiderin staining, L>R. Swelling extends from knees to toes, L>R. Tender to touch. generally well hydrated. Temperature cool to the touch. Deep skin screased at anterior ankles and base of toes w/ mild lobules around base of malleoli. No signs of skin breakdown or infection.   Sensation   Additional Comments BLE periferal neuropathy -feet          LYMPHEDEMA/ONCOLOGY QUESTIONNAIRE - 04/05/16 1354    What other symptoms do you have   Are you Having Heaviness or Tightness Yes   Are you having Pain Yes   Are you having pitting edema No   Is it Hard or Difficult finding clothes that fit Yes   Do you have infections Yes   [place] Sign Yes   Lymphedema Stage   Stage STAGE 2 SPONTANEOUSLY IRREVERSIBLE   Right Lower Extremity Lymphedema   Other RLE ankle to knee (A-D) limb volume = 4332.32 ml., which is increased by 21.44% since last measured on 01/25/16   Other RLE ankle to groin (A-G) limb volume = 9196.29 ml., which is increased by 11.86% since last measured on 01/25/16   Left Lower Extremity Lymphedema   Other LLE ankle to knee (A-D) limb volume =  4637.80 ml., which is increased by 24.48% since last measured on 02/04/16   Other LLE ankle to groin(A-G) limb volume = 8026.09 ml., which is increased by 13.53% since last measured on 02/04/16                OT Treatments/Exercises (OP) - 04/05/16 0001    Manual Therapy   Manual Therapy Edema management;Compression Bandaging   Manual therapy comments completed BLE comparative limb volumetrics today   Compression Bandaging LLE gradient compression wraps applied from toes to below knee: toe wrap x1 under cotton stockinett; 8 cm x 5 m x 1 to foot and ankle, 10 cm  x 5 m x 2, 12 cm x5 cm x 1, applied circumferentially in custommary layered gradient configuration  over .04 x 10 cm x 5 m Rosidol Soft x 1.5 .rolls.                OT Education - 04/05/16 1638    Education provided Yes   Education Details Provided Pt/caregiver  skilled education and ADL training throughout visit for lymphedema etiology, progression, and treatment including Intensive and Management Phase Complete Decongestive Therapy (CDT)  Discussed lymphedema precautions, cellulitis risk, and all CDT and LE self-care components, including compression wrapping/ garments & devices, lymphatic pumping ther ex, simple self-MLD, and skin care. Provided printed Lymphedema Workbook for reference. Discussed results of volumetrics completed today and  essentially that clinical gains made during intial treatment course have largely been lost to date.   Person(s) Educated Patient;Child(ren)   Methods Explanation;Demonstration;Tactile cues;Verbal cues;Handout   Comprehension Verbalized understanding;Need further instruction;Verbal cues required;Tactile cues required             OT Long Term Goals - 04/05/16 1650    OT LONG TERM GOAL #1   Title Lymphedema (LE) self-care: Pt able to don/doff adjustable, alternative compression garments ( aka CircAif, Francia Greaves) between OT visits with Maximal caregiver assistance within 2 weeks to   facilitate improved prognosis and optimal volume reduction during Intensive CDT.   Baseline dependent   Time 2   Period Weeks   Status New   OT LONG TERM GOAL #2   Title Pt to achieve at least 10% BLE limb volume reductions during Intensive CDT to limit LE progression, decrease infection risk, to reduce foot and leg pain, and to improve ability to fit lower body clothing and shoes.   Baseline dependent   Time 12   Period Weeks   Status New   OT LONG TERM GOAL #3   Title Pt >/= 85 % compliant with all daily, LE self-care protocols for home program, including simple self-manual lymphatic drainage (MLD), skin care, lymphatic pumping the ex, skin care, and donning/ doffing compression wraps and garments with needed level of caregiver assistance to limit LE progression and further functional decline.     Baseline dependent   Time 12   Period Weeks   Status New   OT LONG TERM GOAL #4   Title Lymphedema (LE) self-care : Pt >/= 85 % compliant with all daily LE self-care protocols, including simple self-manual lymphatic drainage (MLD), skin care, lymphatic pumping therex, and donning/ doffing progression garments with needed level of caregiver assistance to limit LE progression, infection risk and further functional decline.     Baseline dependent   Time 12   Period Weeks   Status New   OT LONG TERM GOAL #5   Title Pt to tolerate daily compression wraps, garments and devices in keeping w/ prescribed wear regime within 1 week of issue date to progress and retain clinical and functional gains and to limit LE progression.   Baseline dependent   Time 12   Period Weeks   Status New               Plan - 04/05/16 1640    Clinical Impression Statement moderate stage II-II BLE lymphedema (LE) secondary to CVI . Contibuting factors include recurrent cellulitis, non-healing wounds, hx heart failure, and inconsistent caregiver support between OT sessions. Pt is an unreliable historian and at times   discusses delusions regarding lymphedema.  When seen in the past for 9 visits she made excellent progress towards LE goals, even with limitted support. Medical and social factors together present obstacles for optimal clinical outcomes. Discussed these issues at length with Pt and her newphew and we came up with alternative compression for use between visits that is more caregiver friendly. Pt will benefit from OT for Complete Decongestive Therapy to reduce limb swlling, decrease infection and falls  risk,  to improve ADL performance. Without OT for CDT, condition is expected to worsen and progress, and further functional decline is expected.   Rehab Potential Fair   Clinical Impairments Affecting Rehab Potential Pt presents with decreased prognosis 2/2 overall health status w/ multiple comorbidities and history of non-compliance with previous  lymphedema treatment.   OT Frequency 3x / week   OT Duration 12 weeks   OT Treatment/Interventions Self-care/ADL training;DME and/or AE instruction;Manual lymph drainage;Patient/family education;Compression bandaging;Therapeutic exercise;Therapeutic activities;Manual Therapy   Plan Nephew will bring existing adjustable compression garments (CIRCaids?) for assessment. We will determine if these might be used by caregiver/s during visit intervalls instead of labor intensive wraps. Once Limb swelling reaches reduction plateau, we'll ft with optimal appropriate compression for day and night time use that is caregiver and patient friendly for donning and doffing at hhome.Fit with adjustable, knee length   Consulted and Agree with Plan of Care Patient;Family member/caregiver      Patient will benefit from skilled therapeutic intervention in order to improve the following deficits and impairments:  Decreased range of motion, Difficulty walking, Impaired flexibility, Increased edema, Decreased activity tolerance, Decreased knowledge of precautions, Decreased skin  integrity, Decreased balance, Decreased knowledge of use of DME, Decreased scar mobility, Pain, Decreased mobility  Visit Diagnosis: Lymphedema - Plan: Ot plan of care cert/re-cert  Lymphedema of left lower extremity      G-Codes - 05/05/16 1653    Functional Assessment Tool Used Clinical observation and examination, medical records review, Pt and caregiver interview, comparative limb volumetrics   Functional Limitation Self care   Self Care Current Status [id number]) 100 percent impaired, limited or restricted   Self Care Goal Status [id number]) At least 60 percent but less than 80 percent impaired, limited or restricted      Problem List Patient Active Problem List   Diagnosis Date Noted  . Headache 06/22/2015  . Anemia, iron deficiency 04/05/2015  . Rectal benign neoplasm   . Asthma, chronic 04/04/2015  . history of Tubular adenoma of colon 04/04/2015  . 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 Hypokalemia 04/04/2015  . CKD (chronic kidney disease) stage 3, GFR 30-59 ml/min 04/03/2015  . Dehydration 04/03/2015  . Skin ulcer ([place])   . History of colonic polyps 12/08/2014  . Edema 04/14/2014  . Chronic systolic heart failure ([place]) 11/20/2013  . Nausea with vomiting 11/13/2013  . Lymphedema of lower extremity 10/17/2013  . 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 MGUS (monoclonal gammopathy of unknown significance) 05/15/2012  . VITAMIN D DEFICIENCY 01/29/2010  . BURSITIS, LEFT ELBOW 01/29/2010  . DERMATITIS 09/30/2009  . DIABETES MELLITUS, BORDERLINE 01/11/2009  . Paranoid delusion ([place]) 08/02/2008  . OBSTRUCTIVE CHRONIC BRONCHITIS 08/02/2008  . Essential hypertension 11/11/2007  . Venous (peripheral) insufficiency 11/11/2007  . RENAL CALCULUS 11/11/2007  . LOW BACK PAIN, CHRONIC 11/11/2007    Andrey Spearman, MS, OTR/L, [facility] 05/05/16 5:06 PM  [place] MAIN [facility] SERVICES [address] [place], [place], [address] Phone:  [phone]   Fax:  [phone]  Name: [name] MRN: [id number] Date of Birth: 1932/09/02

## 2016-04-05 NOTE — Patient Instructions (Signed)
Lymphedema Precautions  If you experience atypical shortness of breath, or notice any signs /symptoms of skin infection (aka cellulitis) remove all compression wraps/ garments, discontinue manual lymphatic drainage (MLD), and report symptoms to your physician immediately. Discontinue MLD and compression for 72 hours after you take your first oral antibiotic so not to spread the infection.   Lymphedema Self- Care Instructions  1. EXERCISE: Perform lymphatic pumping there ex 2 x a day. While wearing your compression wraps or garments. Perform 10 reps of each exercise bilaterally and be sure to perform them in order. Don;t skip around!  OMIT PARTIAL SIT UP  2. MLD: Perform simple self-Manual Lymphatic Drainage (MLD) at least once a day as directed.  3. WRAPS: Compression wraps are to be worn 23 hrs/ 7 days/wk during Intensive Phase of Complete Decongestive Therapy (CDT).Building tolerance may take time and practice, so don't get discouraged. If bandages begin to feel tight during periods of inactivity and/or during the night, try performing your exercises to loosen them.   REMOVE COMPRESSION AT LEAST EVERY 24  HOURS and INSPECT, CLEAN AND MOISTURIZE SKIN.  4. GARMENTS: During Management Phase CDT your compression garments are to be worn during waking hours when active. Do NOT sleep in your garments!!   5. PUT YOUR FEET UP! Elevate your feet and legs and feet to the level of your heart whenever you are sitting down.   6. SKIN: Carefully monitor skin condition and perform impeccable hygiene daily. Bathe skin with mild soap and water and apply low pH lotion (aka Eucerin ) to improve hydration and limit infection risk.  REMOVE COMPRESSION AT LEAST EVERY 24  HOURS and INSPECT, CLEAN AND MOISTURIZE SKIN.     Lymphatic Pumping Exercises:

## 2016-04-07 ENCOUNTER — Ambulatory Visit: Payer: Medicare Other | Admitting: Occupational Therapy

## 2016-04-07 DIAGNOSIS — I890 Lymphedema, not elsewhere classified: Secondary | ICD-10-CM | POA: Diagnosis not present

## 2016-04-07 NOTE — Patient Instructions (Signed)
DO NOT SLEEP in CircAid Juxtafit at Night until instructed to do so.  LE instructions and precautions as established- see initial eval.

## 2016-04-07 NOTE — Therapy (Signed)
[place] MAIN [facility] SERVICES [address] [place], [place], [address] Phone: [phone]   Fax:  [phone]  Occupational Therapy Treatment  Patient Details  Name: [name] MRN: [id number] Date of Birth: 06-29-1932 Referring Provider: Cecilie Kicks, NP for Dr Daneen Schick at [facility]  Encounter Date: 04/07/2016      OT End of Session - 04/07/16 1004    Visit Number 2   Number of Visits 36   Date for OT Re-Evaluation 04/02/16   Authorization Type 11/36   OT Start Time 0900   OT Stop Time 0957   OT Time Calculation (min) 57 min      Past Medical History  Diagnosis Date  . Obstructive chronic bronchitis without exacerbation ([place])   . Unspecified essential hypertension   . Unspecified venous (peripheral) insufficiency   . Edema 2005    "since put the glass in them"   . Other abnormal glucose   . Lumbago   . Unspecified nonpsychotic mental disorder   . Unspecified venous (peripheral) insufficiency   . Unspecified vitamin D deficiency   . Anemia, unspecified   . Anxiety   . Arthritis   . Asthma   . Systolic heart failure (HCC)     EF 35 to 40% per echo January 2015  . Depression   . GERD (gastroesophageal reflux disease)     Past Surgical History  Procedure Laterality Date  . Cholecystectomy    . Tonsillectomy    . Cervical spine surgery      x 2  . Partial hysterectomy    . Lumbar disc surgery    . Esophagogastroduodenoscopy N/A 04/05/2015    Procedure: ESOPHAGOGASTRODUODENOSCOPY (EGD);  Surgeon: [name], MD;  Location: [facility];  Service: Endoscopy;  Laterality: N/A;  . Flexible sigmoidoscopy N/A 04/05/2015    Procedure: FLEXIBLE SIGMOIDOSCOPY;  Surgeon: [name], MD;  Location: [facility];  Service: Endoscopy;  Laterality: N/A;    There were no vitals filed for this visit.      Subjective Assessment - 04/07/16 0911    Subjective  Pt presents for OT visit 2 for CDT to address second  treatment episode of BLE Lymphedema. Pt is accompanied by her nephew, [name], who brings CircAid JuxtaFit ordered by wound care physician previously. Pt and nephew agree w/ plan to utilize [employer] as alternatives to bandages during visit intervals as alternative to compression wraps b/c these are difficult and time consuming for caregivers to appy.   Patient is accompained by: Family member   Pertinent History Chronic bronchitis, CVI, anxiety , systoloc hert failure, asthma, arthritis, multiple spinal surgeries w/ chronic back pain   Limitations Difficulty walking, decreased standing tolerance, impaired transfer ability, decreased ankle AROM, Pain in BLE, Inability to fit street shoes w/ increased falls risk, increased infection risk.   Patient Stated Goals reduce swelling and legs feel better   Currently in Pain? Yes  tenderness in LLE note numerically rated   Pain Onset More than a month ago                      OT Treatments/Exercises (OP) - 04/07/16 0001    ADLs   ADL Education Given Yes   Manual Therapy   Edema Management skin care w/ low ph Eucerin lotion to LLE a applying compression wraps.   Manual Lymphatic Drainage (MLD) no MLD today 2/2 time comstraints.Emphasis of visit was on modifying, fitting and training Pt and  caregiver to donn and doff CircAid wraps .   Compression Bandaging LLE gradient compression wraps applied from toes to below knee: toe wrap x1 under cotton stockinett; 8 cm x 5 m x 1 to foot and ankle, 10 cm  x 5 m x 2, 12 cm x5 cm x 1, applied circumferentially in custommary layered gradient configuration  over .04 x 10 cm x 5 m Rosidol Soft x 1.5 .rolls.                 OT Education - 04/07/16 1003    Education provided Yes   Education Details Donning and doffing Circ Aid Juxtafit to BLE and wear and care regime   Person(s) Educated Patient;Child(ren)   Methods Explanation;Demonstration;Tactile cues;Verbal cues;Handout   Comprehension Verbalized  understanding;Returned demonstration;Verbal cues required;Tactile cues required;Need further instruction             OT Long Term Goals - 04/05/16 1650    OT LONG TERM GOAL #1   Title Lymphedema (LE) self-care: Pt able to don/doff adjustable, alternative compression garments ( aka CircAif, Francia Greaves) between OT visits with Maximal caregiver assistance within 2 weeks to  facilitate improved prognosis and optimal volume reduction during Intensive CDT.   Baseline dependent   Time 2   Period Weeks   Status New   OT LONG TERM GOAL #2   Title Pt to achieve at least 10% BLE limb volume reductions during Intensive CDT to limit LE progression, decrease infection risk, to reduce foot and leg pain, and to improve ability to fit lower body clothing and shoes.   Baseline dependent   Time 12   Period Weeks   Status New   OT LONG TERM GOAL #3   Title Pt >/= 85 % compliant with all daily, LE self-care protocols for home program, including simple self-manual lymphatic drainage (MLD), skin care, lymphatic pumping the ex, skin care, and donning/ doffing compression wraps and garments with needed level of caregiver assistance to limit LE progression and further functional decline.     Baseline dependent   Time 12   Period Weeks   Status New   OT LONG TERM GOAL #4   Title Lymphedema (LE) self-care : Pt >/= 85 % compliant with all daily LE self-care protocols, including simple self-manual lymphatic drainage (MLD), skin care, lymphatic pumping therex, and donning/ doffing progression garments with needed level of caregiver assistance to limit LE progression, infection risk and further functional decline.     Baseline dependent   Time 12   Period Weeks   Status New   OT LONG TERM GOAL #5   Title Pt to tolerate daily compression wraps, garments and devices in keeping w/ prescribed wear regime within 1 week of issue date to progress and retain clinical and functional gains and to limit LE progression.    Baseline dependent   Time 12   Period Weeks   Status New               Plan - 04/07/16 1010    Clinical Impression Statement LLE volume in L leg and foot are significantly decreased today.Reiterated again to Pt and caregiver the importance of removing compression wraps every 23 hours to inspect skin, bathe , appy recommended low pH lotion and re-wrap. Skin is dry and flaking and skin wrinkles are apparent . Fibrosis is much more palpable and Pt c/o increased tenderness w/ volume reduction. By end of session caregiver was able to apply CircAid  and Wallie Char  foot pieces using proper tension with moderate assistance. Pt instructed not to sleep in [place] over weekend. Pt tolerating treatment very well so far. No signs of skin breakdown or infection observed today.   Rehab Potential Fair   Clinical Impairments Affecting Rehab Potential Pt presents with decreased prognosis 2/2 overall health status w/ multiple comorbidities and history of non-compliance with previous  lymphedema treatment.   OT Frequency 3x / week   OT Duration 12 weeks   OT Treatment/Interventions Self-care/ADL training;DME and/or AE instruction;Manual lymph drainage;Patient/family education;Compression bandaging;Therapeutic exercise;Therapeutic activities;Manual Therapy   Consulted and Agree with Plan of Care Patient;Family member/caregiver      Patient will benefit from skilled therapeutic intervention in order to improve the following deficits and impairments:  Decreased range of motion, Difficulty walking, Impaired flexibility, Increased edema, Decreased activity tolerance, Decreased knowledge of precautions, Decreased skin integrity, Decreased balance, Decreased knowledge of use of DME, Decreased scar mobility, Pain, Decreased mobility  Visit Diagnosis: Lymphedema    Problem List Patient Active Problem List   Diagnosis Date Noted  . Headache 06/22/2015  . Anemia, iron deficiency 04/05/2015  . Rectal benign  neoplasm   . Asthma, chronic 04/04/2015  . history of Tubular adenoma of colon 04/04/2015  . 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 Hypokalemia 04/04/2015  . CKD (chronic kidney disease) stage 3, GFR 30-59 ml/min 04/03/2015  . Dehydration 04/03/2015  . Skin ulcer ([place])   . History of colonic polyps 12/08/2014  . Edema 04/14/2014  . Chronic systolic heart failure ([place]) 11/20/2013  . Nausea with vomiting 11/13/2013  . Lymphedema of lower extremity 10/17/2013  . 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[place]) 04/04/2013  . MGUS (monoclonal gammopathy of unknown significance) 05/15/2012  . VITAMIN D DEFICIENCY 01/29/2010  . BURSITIS, LEFT ELBOW 01/29/2010  . DERMATITIS 09/30/2009  . DIABETES MELLITUS, BORDERLINE 01/11/2009  . Paranoid delusion ([place]) 08/02/2008  . OBSTRUCTIVE CHRONIC BRONCHITIS 08/02/2008  . Essential hypertension 11/11/2007  . Venous (peripheral) insufficiency 11/11/2007  . RENAL CALCULUS 11/11/2007  . LOW BACK PAIN, CHRONIC 11/11/2007    Andrey Spearman, MS, OTR/L, [facility] 04/07/2016 10:16 AM  [place] MAIN [facility] SERVICES [address] [place], [place], [address] Phone: [phone]   Fax:  [phone]  Name: [name] MRN: [id number] Date of Birth: 1932-08-26

## 2016-04-10 ENCOUNTER — Ambulatory Visit: Payer: Medicare Other | Admitting: Occupational Therapy

## 2016-04-12 ENCOUNTER — Ambulatory Visit: Payer: Medicare Other | Admitting: Occupational Therapy

## 2016-04-12 DIAGNOSIS — I890 Lymphedema, not elsewhere classified: Secondary | ICD-10-CM | POA: Diagnosis not present

## 2016-04-12 NOTE — Patient Instructions (Signed)
LE instructions and precautions as established- see initial eval.   

## 2016-04-12 NOTE — Therapy (Signed)
[place] MAIN [facility] SERVICES [address] [place], [place], [address] Phone: [phone]   Fax:  [phone]  Occupational Therapy Treatment  Patient Details  Name: [name] MRN: [id number] Date of Birth: 13-Jul-1932 Referring Provider: Cecilie Kicks, NP for Dr Daneen Schick at [facility]  Encounter Date: 04/12/2016      OT End of Session - 04/12/16 1114    Visit Number 3   Number of Visits 36   Date for OT Re-Evaluation 06/14/16   Authorization Type --   OT Start Time 0924   OT Stop Time 1004   OT Time Calculation (min) 40 min      Past Medical History  Diagnosis Date  . Obstructive chronic bronchitis without exacerbation ([place])   . Unspecified essential hypertension   . Unspecified venous (peripheral) insufficiency   . Edema 2005    "since put the glass in them"   . Other abnormal glucose   . Lumbago   . Unspecified nonpsychotic mental disorder   . Unspecified venous (peripheral) insufficiency   . Unspecified vitamin D deficiency   . Anemia, unspecified   . Anxiety   . Arthritis   . Asthma   . Systolic heart failure (HCC)     EF 35 to 40% per echo January 2015  . Depression   . GERD (gastroesophageal reflux disease)     Past Surgical History  Procedure Laterality Date  . Cholecystectomy    . Tonsillectomy    . Cervical spine surgery      x 2  . Partial hysterectomy    . Lumbar disc surgery    . Esophagogastroduodenoscopy N/A 04/05/2015    Procedure: ESOPHAGOGASTRODUODENOSCOPY (EGD);  Surgeon: [name], MD;  Location: [facility];  Service: Endoscopy;  Laterality: N/A;  . Flexible sigmoidoscopy N/A 04/05/2015    Procedure: FLEXIBLE SIGMOIDOSCOPY;  Surgeon: [name], MD;  Location: [facility];  Service: Endoscopy;  Laterality: N/A;    There were no vitals filed for this visit.      Subjective Assessment - 04/12/16 1109    Subjective  Pt presents for OT visit 3 for CDT to address second  treatment episode of BLE Lymphedema. Pt is accompanied by her nephew, [name]. Pt presents 24 minutes late for 1 hr appointment. Bo new complaints.   Patient is accompained by: Family member   Pertinent History Chronic bronchitis, CVI, anxiety , systoloc hert failure, asthma, arthritis, multiple spinal surgeries w/ chronic back pain   Limitations Difficulty walking, decreased standing tolerance, impaired transfer ability, decreased ankle AROM, Pain in BLE, Inability to fit street shoes w/ increased falls risk, increased infection risk.   Patient Stated Goals reduce swelling and legs feel better   Pain Onset More than a month ago                      OT Treatments/Exercises (OP) - 04/12/16 0001    ADLs   ADL Education Given Yes   Manual Therapy   Manual Therapy Edema management;Compression Bandaging   Edema Management skin care w/ low ph Eucerin lotion to LLE a applying compression wraps.   Manual Lymphatic Drainage (MLD) abbreviated LLE MLD today 2/2 time comstraints.   Compression Bandaging LLE gradient compression wraps applied from toes to below knee: toe wrap x1 under cotton stockinett; 8 cm x 5 m x 1 to foot and ankle, 10 cm  x 5 m x 2, 12 cm x5 cm x 1,  applied circumferentially in custommary layered gradient configuration  over .04 x 10 cm x 5 m Rosidol Soft x 1.5 .rolls.  CircAid adjustable knee length wrap applied w/ loaned [place] Lite footpiece to RLE over [employer]                OT Education - 04/12/16 1113    Education provided Yes   Education Details importance of ongoing, diligent daily skin care to limit infection risk   Person(s) Educated Patient;Child(ren)   Methods Explanation;Demonstration   Comprehension Verbalized understanding             OT Long Term Goals - 04/05/16 1650    OT LONG TERM GOAL #1   Title Lymphedema (LE) self-care: Pt able to don/doff adjustable, alternative compression garments ( aka CircAif, Francia Greaves) between OT  visits with Maximal caregiver assistance within 2 weeks to  facilitate improved prognosis and optimal volume reduction during Intensive CDT.   Baseline dependent   Time 2   Period Weeks   Status New   OT LONG TERM GOAL #2   Title Pt to achieve at least 10% BLE limb volume reductions during Intensive CDT to limit LE progression, decrease infection risk, to reduce foot and leg pain, and to improve ability to fit lower body clothing and shoes.   Baseline dependent   Time 12   Period Weeks   Status New   OT LONG TERM GOAL #3   Title Pt >/= 85 % compliant with all daily, LE self-care protocols for home program, including simple self-manual lymphatic drainage (MLD), skin care, lymphatic pumping the ex, skin care, and donning/ doffing compression wraps and garments with needed level of caregiver assistance to limit LE progression and further functional decline.     Baseline dependent   Time 12   Period Weeks   Status New   OT LONG TERM GOAL #4   Title Lymphedema (LE) self-care : Pt >/= 85 % compliant with all daily LE self-care protocols, including simple self-manual lymphatic drainage (MLD), skin care, lymphatic pumping therex, and donning/ doffing progression garments with needed level of caregiver assistance to limit LE progression, infection risk and further functional decline.     Baseline dependent   Time 12   Period Weeks   Status New   OT LONG TERM GOAL #5   Title Pt to tolerate daily compression wraps, garments and devices in keeping w/ prescribed wear regime within 1 week of issue date to progress and retain clinical and functional gains and to limit LE progression.   Baseline dependent   Time 12   Period Weeks   Status New               Plan - 04/12/16 1118    Clinical Impression Statement Limb volume in LLE below knee is well managed since last visit. As lymphatic congestion continues to decrease, dense fibrosis and patches of very dry, hardened skin are becoming  increasingly palpable. Dorsal foot is decreasing slower than leg. Deep skin folds are becoming more apparent as foot volume decreases. Stemmer sign remains strongly positive. Pt  not getting gelp w/ compression between visits is greatest obstacle to progress at present, as well as in the past.   Rehab Potential Fair   Clinical Impairments Affecting Rehab Potential Pt presents with decreased prognosis 2/2 overall health status w/ multiple comorbidities and history of non-compliance with previous  lymphedema treatment.   OT Frequency 3x / week   OT Duration 12 weeks  OT Treatment/Interventions Self-care/ADL training;DME and/or AE instruction;Manual lymph drainage;Patient/family education;Compression bandaging;Therapeutic exercise;Therapeutic activities;Manual Therapy   Consulted and Agree with Plan of Care Patient;Family member/caregiver      Patient will benefit from skilled therapeutic intervention in order to improve the following deficits and impairments:  Decreased range of motion, Difficulty walking, Impaired flexibility, Increased edema, Decreased activity tolerance, Decreased knowledge of precautions, Decreased skin integrity, Decreased balance, Decreased knowledge of use of DME, Decreased scar mobility, Pain, Decreased mobility  Visit Diagnosis: Lymphedema    Problem List Patient Active Problem List   Diagnosis Date Noted  . Headache 06/22/2015  . Anemia, iron deficiency 04/05/2015  . Rectal benign neoplasm   . Asthma, chronic 04/04/2015  . history of Tubular adenoma of colon 04/04/2015  . 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 Hypokalemia 04/04/2015  . CKD (chronic kidney disease) stage 3, GFR 30-59 ml/min 04/03/2015  . Dehydration 04/03/2015  . Skin ulcer ([place])   . History of colonic polyps 12/08/2014  . Edema 04/14/2014  . Chronic systolic heart failure ([place]) 11/20/2013  . Nausea with vomiting 11/13/2013  . Lymphedema of lower extremity 10/17/2013  . Psychotic  paranoia ([place]) 04/04/2013  . MGUS (monoclonal gammopathy of unknown significance) 05/15/2012  . VITAMIN D DEFICIENCY 01/29/2010  . BURSITIS, LEFT ELBOW 01/29/2010  . DERMATITIS 09/30/2009  . DIABETES MELLITUS, BORDERLINE 01/11/2009  . Paranoid delusion ([place]) 08/02/2008  . OBSTRUCTIVE CHRONIC BRONCHITIS 08/02/2008  . Essential hypertension 11/11/2007  . Venous (peripheral) insufficiency 11/11/2007  . RENAL CALCULUS 11/11/2007  . LOW BACK PAIN, CHRONIC 11/11/2007   Andrey Spearman, MS, OTR/L, [facility] 04/12/2016 11:24 AM  [place] MAIN [facility] SERVICES [address] [place], [place], [address] Phone: [phone]   Fax:  [phone]  Name: ANANA DEYTON MRN: [id number] Date of Birth: Dec 09, 1931

## 2016-04-14 ENCOUNTER — Ambulatory Visit: Payer: Medicare Other | Admitting: Occupational Therapy

## 2016-04-14 DIAGNOSIS — I890 Lymphedema, not elsewhere classified: Secondary | ICD-10-CM | POA: Diagnosis not present

## 2016-04-14 NOTE — Patient Instructions (Signed)
LE instructions and precautions as established- see initial eval.   

## 2016-04-14 NOTE — Therapy (Signed)
[place] MAIN [facility] SERVICES [address] [place], [place], [address] Phone: [phone]   Fax:  [phone]  Occupational Therapy Treatment  Patient Details  Name: [name] MRN: [id number] Date of Birth: 03/04/1933 Referring Provider: Cecilie Kicks, NP for Dr Daneen Schick at [facility] Cardiology  Encounter Date: 04/14/2016      OT End of Session - 04/14/16 1126    Visit Number 4   Number of Visits 36   Date for OT Re-Evaluation 06/14/16   Authorization Type 12/36   OT Start Time 0920   OT Stop Time 1013   OT Time Calculation (min) 53 min   Equipment Utilized During Treatment LE Management & Self Care Workbook   Activity Tolerance Patient tolerated treatment well;No increased pain;Treatment limited secondary to medical complications (Comment);Other (comment)   Behavior During Therapy WFL for tasks assessed/performed      Past Medical History  Diagnosis Date  . Obstructive chronic bronchitis without exacerbation ([place])   . Unspecified essential hypertension   . Unspecified venous (peripheral) insufficiency   . Edema 2005    "since put the glass in them"   . Other abnormal glucose   . Lumbago   . Unspecified nonpsychotic mental disorder   . Unspecified venous (peripheral) insufficiency   . Unspecified vitamin D deficiency   . Anemia, unspecified   . Anxiety   . Arthritis   . Asthma   . Systolic heart failure (HCC)     EF 35 to 40% per echo January 2015  . Depression   . GERD (gastroesophageal reflux disease)     Past Surgical History  Procedure Laterality Date  . Cholecystectomy    . Tonsillectomy    . Cervical spine surgery      x 2  . Partial hysterectomy    . Lumbar disc surgery    . Esophagogastroduodenoscopy N/A 04/05/2015    Procedure: ESOPHAGOGASTRODUODENOSCOPY (EGD);  Surgeon: [name], MD;  Location: [facility];  Service: Endoscopy;  Laterality: N/A;  . Flexible sigmoidoscopy N/A 04/05/2015    Procedure:  FLEXIBLE SIGMOIDOSCOPY;  Surgeon: [name], MD;  Location: [facility];  Service: Endoscopy;  Laterality: N/A;    There were no vitals filed for this visit.      Subjective Assessment - 04/14/16 0922    Subjective  Pt presents for OT visit 4 for CDT to address second treatment episode of BLE Lymphedema. Pt is accompanied by her nephew, [name]. Pt presents 22 minutes late for 1 hr appointment. No new complaints.   Patient is accompained by: Family member   Pertinent History Chronic bronchitis, CVI, anxiety , systoloc hert failure, asthma, arthritis, multiple spinal surgeries w/ chronic back pain   Limitations Difficulty walking, decreased standing tolerance, impaired transfer ability, decreased ankle AROM, Pain in BLE, Inability to fit street shoes w/ increased falls risk, increased infection risk.   Patient Stated Goals reduce swelling and legs feel better   Currently in Pain? Yes   Pain Score --  not numerically rated   Pain Onset More than a month ago                      OT Treatments/Exercises (OP) - 04/14/16 0001    ADLs   ADL Education Given Yes   Manual Therapy   Manual Therapy Edema management;Compression Bandaging;Manual Lymphatic Drainage (MLD)   Edema Management skin care w/ low ph Eucerin lotion to LLE a applying compression wraps.  Manual Lymphatic Drainage (MLD) LLE MLD as established   Compression Bandaging LLE gradient compression wraps applied from toes to below knee: toe wrap x1 under cotton stockinett; 8 cm x 5 m x 1 to foot and ankle, 10 cm  x 5 m x 2, 12 cm x5 cm x 1, applied circumferentially in custommary layered gradient configuration  over .04 x 10 cm x 5 m Rosidol Soft x 1.5 .rolls.  CircAid adjustable knee length wrap applied w/ loaned [employer] Lite footpiece to RLE over [employer]                OT Education - 04/14/16 [phone]    Education provided Yes   Education Details Continued skilled Pt/caregiver Education  And LE ADL  training throughout visit for lymphedema self care, including compression wrapping, compression garment and device wear/care, lymphatic pumping ther ex, simple self-MLD, and skin care. Discussed progress towards goals.   Person(s) Educated Patient;Child(ren)   Methods Explanation;Demonstration   Comprehension Verbalized understanding;Need further instruction             OT Long Term Goals - 04/05/16 1650    OT LONG TERM GOAL #1   Title Lymphedema (LE) self-care: Pt able to don/doff adjustable, alternative compression garments ( aka CircAif, Francia Greaves) between OT visits with Maximal caregiver assistance within 2 weeks to  facilitate improved prognosis and optimal volume reduction during Intensive CDT.   Baseline dependent   Time 2   Period Weeks   Status New   OT LONG TERM GOAL #2   Title Pt to achieve at least 10% BLE limb volume reductions during Intensive CDT to limit LE progression, decrease infection risk, to reduce foot and leg pain, and to improve ability to fit lower body clothing and shoes.   Baseline dependent   Time 12   Period Weeks   Status New   OT LONG TERM GOAL #3   Title Pt >/= 85 % compliant with all daily, LE self-care protocols for home program, including simple self-manual lymphatic drainage (MLD), skin care, lymphatic pumping the ex, skin care, and donning/ doffing compression wraps and garments with needed level of caregiver assistance to limit LE progression and further functional decline.     Baseline dependent   Time 12   Period Weeks   Status New   OT LONG TERM GOAL #4   Title Lymphedema (LE) self-care : Pt >/= 85 % compliant with all daily LE self-care protocols, including simple self-manual lymphatic drainage (MLD), skin care, lymphatic pumping therex, and donning/ doffing progression garments with needed level of caregiver assistance to limit LE progression, infection risk and further functional decline.     Baseline dependent   Time 12   Period Weeks    Status New   OT LONG TERM GOAL #5   Title Pt to tolerate daily compression wraps, garments and devices in keeping w/ prescribed wear regime within 1 week of issue date to progress and retain clinical and functional gains and to limit LE progression.   Baseline dependent   Time 12   Period Weeks   Status New               Plan - 04/14/16 1127    Clinical Impression Statement L Leg and foot congestion continue to decrease w/ CDT. Pores are less distended and skin wrinkles on feet and legs are becoming more apparent and skin is flaking off at moderate rate. Pt pleased w/ progress so far. Reports easier transfers  and ambulation as limb weight and decreases. RLE responding well to compression w/ existing  CircAid and loaned [employer] foot piece. Cont as per POC.   Rehab Potential Fair   Clinical Impairments Affecting Rehab Potential Pt presents with decreased prognosis 2/2 overall health status w/ multiple comorbidities and history of non-compliance with previous  lymphedema treatment.   OT Frequency 3x / week   OT Duration 12 weeks   OT Treatment/Interventions Self-care/ADL training;DME and/or AE instruction;Manual lymph drainage;Patient/family education;Compression bandaging;Therapeutic exercise;Therapeutic activities;Manual Therapy   Consulted and Agree with Plan of Care Patient;Family member/caregiver      Patient will benefit from skilled therapeutic intervention in order to improve the following deficits and impairments:  Decreased range of motion, Difficulty walking, Impaired flexibility, Increased edema, Decreased activity tolerance, Decreased knowledge of precautions, Decreased skin integrity, Decreased balance, Decreased knowledge of use of DME, Decreased scar mobility, Pain, Decreased mobility  Visit Diagnosis: Lymphedema    Problem List Patient Active Problem List   Diagnosis Date Noted  . Headache 06/22/2015  . Anemia, iron deficiency 04/05/2015  . Rectal benign  neoplasm   . Asthma, chronic 04/04/2015  . history of Tubular adenoma of colon 04/04/2015  . 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 Hypokalemia 04/04/2015  . CKD (chronic kidney disease) stage 3, GFR 30-59 ml/min 04/03/2015  . Dehydration 04/03/2015  . Skin ulcer ([place])   . History of colonic polyps 12/08/2014  . Edema 04/14/2014  . Chronic systolic heart failure ([place]) 11/20/2013  . Nausea with vomiting 11/13/2013  . Lymphedema of lower extremity 10/17/2013  . 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[place]) 04/04/2013  . MGUS (monoclonal gammopathy of unknown significance) 05/15/2012  . VITAMIN D DEFICIENCY 01/29/2010  . BURSITIS, LEFT ELBOW 01/29/2010  . DERMATITIS 09/30/2009  . DIABETES MELLITUS, BORDERLINE 01/11/2009  . Paranoid delusion ([place]) 08/02/2008  . OBSTRUCTIVE CHRONIC BRONCHITIS 08/02/2008  . Essential hypertension 11/11/2007  . Venous (peripheral) insufficiency 11/11/2007  . RENAL CALCULUS 11/11/2007  . LOW BACK PAIN, CHRONIC 11/11/2007   Andrey Spearman, MS, OTR/L, [facility] 04/14/2016 11:31 AM  [place] MAIN [facility] SERVICES [address] [place], [place], [address] Phone: [phone]   Fax:  [phone]  Name: [name] MRN: [id number] Date of Birth: 03/04/1933

## 2016-04-17 ENCOUNTER — Ambulatory Visit: Payer: Medicare Other | Admitting: Occupational Therapy

## 2016-04-17 ENCOUNTER — Other Ambulatory Visit: Payer: Self-pay

## 2016-04-17 DIAGNOSIS — I890 Lymphedema, not elsewhere classified: Secondary | ICD-10-CM | POA: Diagnosis not present

## 2016-04-17 MED ORDER — GABAPENTIN 100 MG PO CAPS: 100.0000 mg | ORAL_CAPSULE | Freq: Every day | ORAL | Status: DC

## 2016-04-18 NOTE — Progress Notes (Signed)
Cardiology Office Note    Date:  04/19/2016   ID:  Daniel, Sophia 03/04/1933, MRN [id number]  PCP:  Gildardo Cranker, DO  Cardiologist: Sinclair Grooms, MD   Chief Complaint  Patient presents with  . Hospitalization Follow-up    Hypertension    History of Present Illness:  [name] is a 82 y.o. female hx of venous insufficiency, systolic CHF, HTN, COPD, CKD, MGUS, anemia. Echo in 1/15 with EF 35-40%. FU nuclear study in 3/15 with EF 50%  She is doing well. She is known to have improved LV function with EF in the 50% range. Worse from the swelling is markedly improved now with therapy for lymphedema. She has a lot of psychosocial issues going on at all.  Past Medical History  Diagnosis Date  . Obstructive chronic bronchitis without exacerbation ([place])   . Unspecified essential hypertension   . Unspecified venous (peripheral) insufficiency   . Edema 2005    "since put the glass in them"   . Other abnormal glucose   . Lumbago   . Unspecified nonpsychotic mental disorder   . Unspecified venous (peripheral) insufficiency   . Unspecified vitamin D deficiency   . Anemia, unspecified   . Anxiety   . Arthritis   . Asthma   . Systolic heart failure (HCC)     EF 35 to 40% per echo January 2015  . Depression   . GERD (gastroesophageal reflux disease)     Past Surgical History  Procedure Laterality Date  . Cholecystectomy    . Tonsillectomy    . Cervical spine surgery      x 2  . Partial hysterectomy    . Lumbar disc surgery    . Esophagogastroduodenoscopy N/A 04/05/2015    Procedure: ESOPHAGOGASTRODUODENOSCOPY (EGD);  Surgeon: [name], MD;  Location: [facility];  Service: Endoscopy;  Laterality: N/A;  . Flexible sigmoidoscopy N/A 04/05/2015    Procedure: FLEXIBLE SIGMOIDOSCOPY;  Surgeon: [name], MD;  Location: [facility];  Service: Endoscopy;  Laterality: N/A;    Current Medications: Outpatient Prescriptions Prior to Visit  Medication  Sig Dispense Refill  . albuterol (PROAIR HFA) 108 (90 BASE) MCG/ACT inhaler INHALE 2 PUFFS EVERY 4 HOURS AS NEEDED FOR WHEEZING 3 Inhaler 1  . ALPRAZolam (XANAX) 0.5 MG tablet Take 1/2 to 1 tablet by mouth 3 times a day as needed for nerves. 90 tablet 1  . ascorbic acid (VITAMIN C) 500 MG tablet Take 1,000 mg by mouth daily.     . carvedilol (COREG) 6.25 MG tablet TAKE 1 TABLET BY MOUTH TWICE DAILY 180 tablet 1  . clotrimazole-betamethasone (LOTRISONE) cream Apply 1 application topically 2 (two) times daily. To her feet 129 g 1  . Ferrous Sulfate Dried 200 (65 FE) MG TABS Take 1 tablet by mouth 2 (two) times daily. 30 tablet 2  . fluticasone-salmeterol (ADVAIR HFA) 115-21 MCG/ACT inhaler Inhale 2 puffs into the lungs 2 (two) times daily. 3 Inhaler 2  . gabapentin (NEURONTIN) 100 MG capsule Take 1 capsule (100 mg total) by mouth at bedtime. 90 capsule 1  . hydrALAZINE (APRESOLINE) 25 MG tablet Take 1 tablet (25 mg total) by mouth 2 (two) times daily. 60 tablet 11  . hydrOXYzine (ATARAX/VISTARIL) 25 MG tablet Take 1 tablet (25 mg total) by mouth every 4 (four) hours as needed. For itching 100 tablet 3  . ondansetron (ZOFRAN) 4 MG tablet TAKE ONE TABLET BY MOUTH THREE TIMES DAILY AS NEEDED FOR  NAUSEA 30 tablet 0  . potassium chloride SA (K-DUR,KLOR-CON) 20 MEQ tablet Take 2 tablets (40 mEq total) by mouth once. 180 tablet 3  . silver sulfADIAZINE (SILVADENE) 1 % cream Apply 1 application topically daily as needed (for foot irritation).    . theophylline (THEO-24) 400 MG 24 hr capsule Take 400 mg by mouth daily.    [name] torsemide (DEMADEX) 20 MG tablet Take 3 tablets (60 mg total) by mouth 2 (two) times daily. 540 tablet 1  . traMADol (ULTRAM) 50 MG tablet TAKE 1 TABLET BY MOUTH THREE TIMES DAILY 90 tablet 1  . triamcinolone cream (KENALOG) 0.1 % Apply 1 application topically 2 (two) times daily.  2  . Vitamin D, Ergocalciferol, (DRISDOL) 50000 units CAPS capsule TAKE 1 CAPSULE BY MOUTH EVERY 7 DAYS ON  SUNDAY OR MONDAY 4 capsule 3   No facility-administered medications prior to visit.     Allergies:   Aspirin; Codeine; and Latex   Social History   Social History  . Marital Status: Married    Spouse Name: N/A  . Number of Children: 0  . Years of Education: N/A   Occupational History  . retired    Social History Main Topics  . Smoking status: Never Smoker   . Smokeless tobacco: Never Used  . Alcohol Use: No  . Drug Use: No  . Sexual Activity: Not Currently   Other Topics Concern  . None   Social History Narrative   2 years of college     Family History:  The patient's family history includes Cancer in her paternal aunt; Diabetes in her mother and paternal grandfather; Heart disease in her maternal aunt.   ROS:   Please see the history of present illness.    Cough, leg swelling but improved. Back pain, dizziness, and easy bruising have been noted.  All other systems reviewed and are negative.   PHYSICAL EXAM:   VS:  BP 136/76 mmHg  Pulse 61  Ht 4\' 11"  (1.499 m)  Wt 147 lb 9.6 oz (66.951 kg)  BMI 29.80 kg/m2   GEN: Well nourished, well developed, in no acute distress HEENT: normal Neck: no JVD, carotid bruits, or masses Cardiac: RRR; no murmurs, rubs, or gallops,no edema  Respiratory:  clear to auscultation bilaterally, normal work of breathing GI: soft, nontender, nondistended, + BS MS: no deformity or atrophy Skin: warm and dry, no rash Neuro:  Alert and Oriented x 3, Strength and sensation are intact Psych: euthymic mood, full affect  Wt Readings from Last 3 Encounters:  04/19/16 147 lb 9.6 oz (66.951 kg)  02/08/16 149 lb 7.2 oz (67.79 kg)  02/03/16 148 lb 12.8 oz (67.495 kg)      Studies/Labs Reviewed:   EKG:  EKG  Is not performed.  Recent Labs: 12/03/2015: Magnesium 2.3 02/08/2016: ALT 13; BUN 29*; Creatinine, Ser 1.47*; Hemoglobin 10.9*; Platelets 252.0; Potassium 3.4*; Pro B Natriuretic peptide (BNP) 66.0; Sodium 139; TSH 0.69   Lipid Panel     Component Value Date/Time   CHOL 182 01/28/2010 1023   TRIG 75.0 01/28/2010 1023   HDL 57.90 01/28/2010 1023   CHOLHDL 3 01/28/2010 1023   VLDL 15.0 01/28/2010 1023   LDLCALC 109* 01/28/2010 1023    Additional studies/ records that were reviewed today include:  2015 Echocardiogram ------------------------------------------------------------ Study Conclusions  - Left ventricle: The cavity size was normal. Wall thickness was normal. Systolic function was moderately reduced. The estimated ejection fraction was in the range of 35% to 40%.  There is septal wall akinesis/severe hypokinesis. Otherwisethere is diffusehypokinesis of moderate to severe degree. Features are consistent with a pseudonormal left ventricular filling pattern, with concomitant abnormal relaxation and increased filling pressure (grade 2 diastolic dysfunction). - Atrial septum: No defect or patent foramen ovale was identified. - Pulmonary arteries: Systolic pressure was mildly to moderately increased. PA peak pressure: 9mm Hg (S).   ASSESSMENT:    1. Chronic combined systolic and diastolic CHF (congestive heart failure) ([place])   2. CKD (chronic kidney disease) stage 3, GFR 30-59 ml/min   3. Essential hypertension   4. Encounter for long-term (current) use of medications      PLAN:  In order of problems listed above:  1. Volume status is stable. I reviewed kidney/blood work from Dr. Jimmy Footman which reveals a kidney function is stable. Overall doing well. 2. Stable. 3. Well controlled. 2 g sodium diet is recommended.    Medication Adjustments/Labs and Tests Ordered: Current medicines are reviewed at length with the patient today.  Concerns regarding medicines are outlined above.  Medication changes, Labs and Tests ordered today are listed in the Patient Instructions below. There are no Patient Instructions on file for this visit.   Signed, Sinclair Grooms, MD  04/19/2016 2:56 PM     [facility] [place], [place], [place]  [address] Phone: [phone]; Fax: [phone]

## 2016-04-18 NOTE — Therapy (Signed)
[place] MAIN [facility] SERVICES [address] [place], [place], [address] Phone: [phone]   Fax:  [phone]  Occupational Therapy Treatment  Patient Details  Name: [name] MRN: [id number] Date of Birth: 03/04/1933 Referring Provider: Cecilie Kicks, NP for Dr Daneen Schick at [facility] Cardiology  Encounter Date: 04/17/2016      OT End of Session - 04/18/16 1950    Visit Number 5   Number of Visits 36   Date for OT Re-Evaluation 06/14/16   Authorization Type 13/36   OT Start Time 0902   OT Stop Time 0930   OT Time Calculation (min) 28 min   Activity Tolerance Patient tolerated treatment well;No increased pain;Treatment limited secondary to medical complications (Comment);Other (comment)   Behavior During Therapy WFL for tasks assessed/performed      Past Medical History  Diagnosis Date  . Obstructive chronic bronchitis without exacerbation ([place])   . Unspecified essential hypertension   . Unspecified venous (peripheral) insufficiency   . Edema 2005    "since put the glass in them"   . Other abnormal glucose   . Lumbago   . Unspecified nonpsychotic mental disorder   . Unspecified venous (peripheral) insufficiency   . Unspecified vitamin D deficiency   . Anemia, unspecified   . Anxiety   . Arthritis   . Asthma   . Systolic heart failure (HCC)     EF 35 to 40% per echo January 2015  . Depression   . GERD (gastroesophageal reflux disease)     Past Surgical History  Procedure Laterality Date  . Cholecystectomy    . Tonsillectomy    . Cervical spine surgery      x 2  . Partial hysterectomy    . Lumbar disc surgery    . Esophagogastroduodenoscopy N/A 04/05/2015    Procedure: ESOPHAGOGASTRODUODENOSCOPY (EGD);  Surgeon: [name], MD;  Location: [facility];  Service: Endoscopy;  Laterality: N/A;  . Flexible sigmoidoscopy N/A 04/05/2015    Procedure: FLEXIBLE SIGMOIDOSCOPY;  Surgeon: [name], MD;  Location: [facility]  ENDOSCOPY;  Service: Endoscopy;  Laterality: N/A;    There were no vitals filed for this visit.      Subjective Assessment - 04/18/16 1944    Subjective  Patient arrived to the clinic and reports she had an appointment this date however, she was not on the schedule.  She had her nephew drive her from [place] and will not be back until Friday.    Patient is accompained by: Family member   Pertinent History Chronic bronchitis, CVI, anxiety , systoloc hert failure, asthma, arthritis, multiple spinal surgeries w/ chronic back pain   Limitations Difficulty walking, decreased standing tolerance, impaired transfer ability, decreased ankle AROM, Pain in BLE, Inability to fit street shoes w/ increased falls risk, increased infection risk.   Patient Stated Goals reduce swelling and legs feel better   Currently in Pain? No/denies   Pain Score 0-No pain             LYMPHEDEMA/ONCOLOGY QUESTIONNAIRE - 04/18/16 1946    What other symptoms do you have   Are you Having Heaviness or Tightness Yes   Are you having Pain Yes   Are you having pitting edema No   Is it Hard or Difficult finding clothes that fit Yes   Do you have infections Yes   [place]mer Sign Yes   Lymphedema Stage   Stage STAGE 2 SPONTANEOUSLY IRREVERSIBLE   Right Lower Extremity Lymphedema  Other --   Other --   Left Lower Extremity Lymphedema   Other --   Other --                 OT Treatments/Exercises (OP) - 04/18/16 1947    Manual Therapy   Manual Therapy Edema management;Compression Bandaging;Manual Lymphatic Drainage (MLD)   Edema Management skin care w/ low ph Eucerin lotion to LLE a applying compression wraps.   Compression Bandaging LLE gradient compression wraps applied from toes to below knee: toe wrap x1 under cotton stockinet; 8 cm x 5 m x 1 to foot and ankle, 10 cm  x 5 m x 2, 12 cm x5 cm x 1, applied circumferentially in customary layered gradient configuration  over .04 x 10 cm x 5 m Rosidal Soft  x 1.5 .rolls. Added grey foam to dorsum of patient's left foot to assist with decongestion of this area.                 OT Education - 04/18/16 1949    Education Details Continued focus on compression wrapping, use of grey foam, skin care   Person(s) Educated Patient;Child(ren)   Methods Explanation;Demonstration;Verbal cues   Comprehension Verbal cues required;Returned demonstration;Verbalized understanding             OT Long Term Goals - 04/05/16 1650    OT LONG TERM GOAL #1   Title Lymphedema (LE) self-care: Pt able to don/doff adjustable, alternative compression garments ( aka CircAif, Francia Greaves) between OT visits with Maximal caregiver assistance within 2 weeks to  facilitate improved prognosis and optimal volume reduction during Intensive CDT.   Baseline dependent   Time 2   Period Weeks   Status New   OT LONG TERM GOAL #2   Title Pt to achieve at least 10% BLE limb volume reductions during Intensive CDT to limit LE progression, decrease infection risk, to reduce foot and leg pain, and to improve ability to fit lower body clothing and shoes.   Baseline dependent   Time 12   Period Weeks   Status New   OT LONG TERM GOAL #3   Title Pt >/= 85 % compliant with all daily, LE self-care protocols for home program, including simple self-manual lymphatic drainage (MLD), skin care, lymphatic pumping the ex, skin care, and donning/ doffing compression wraps and garments with needed level of caregiver assistance to limit LE progression and further functional decline.     Baseline dependent   Time 12   Period Weeks   Status New   OT LONG TERM GOAL #4   Title Lymphedema (LE) self-care : Pt >/= 85 % compliant with all daily LE self-care protocols, including simple self-manual lymphatic drainage (MLD), skin care, lymphatic pumping therex, and donning/ doffing progression garments with needed level of caregiver assistance to limit LE progression, infection risk and further  functional decline.     Baseline dependent   Time 12   Period Weeks   Status New   OT LONG TERM GOAL #5   Title Pt to tolerate daily compression wraps, garments and devices in keeping w/ prescribed wear regime within 1 week of issue date to progress and retain clinical and functional gains and to limit LE progression.   Baseline dependent   Time 12   Period Weeks   Status New               Plan - 04/18/16 1950    Clinical Impression Statement Patient arrived but did  not have a scheduled appointment, therapist had limited time this date and therefore was not able to perform MLD.  Focused on compression wrapping for continued decongestion of left lower extremity from toes to just below the knee.  Increased edema on dorsum of the foot this date and added grey foam piece to further enhance decongestion of this area.  Patient is progressing well, increased skin folds noted and patient is pleased with progress.    Rehab Potential Fair   Clinical Impairments Affecting Rehab Potential Pt presents with decreased prognosis 2/2 overall health status w/ multiple comorbidities and history of non-compliance with previous  lymphedema treatment.   OT Frequency 3x / week   OT Duration 12 weeks   OT Treatment/Interventions Self-care/ADL training;DME and/or AE instruction;Manual lymph drainage;Patient/family education;Compression bandaging;Therapeutic exercise;Therapeutic activities;Manual Therapy   Consulted and Agree with Plan of Care Patient;Family member/caregiver      Patient will benefit from skilled therapeutic intervention in order to improve the following deficits and impairments:  Decreased range of motion, Difficulty walking, Impaired flexibility, Increased edema, Decreased activity tolerance, Decreased knowledge of precautions, Decreased skin integrity, Decreased balance, Decreased knowledge of use of DME, Decreased scar mobility, Pain, Decreased mobility  Visit  Diagnosis: Lymphedema  Lymphedema of left lower extremity    Problem List Patient Active Problem List   Diagnosis Date Noted  . Headache 06/22/2015  . Anemia, iron deficiency 04/05/2015  . Rectal benign neoplasm   . Asthma, chronic 04/04/2015  . history of Tubular adenoma of colon 04/04/2015  . 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 Hypokalemia 04/04/2015  . CKD (chronic kidney disease) stage 3, GFR 30-59 ml/min 04/03/2015  . Dehydration 04/03/2015  . Skin ulcer ([place])   . History of colonic polyps 12/08/2014  . Edema 04/14/2014  . Chronic systolic heart failure ([place]) 11/20/2013  . Nausea with vomiting 11/13/2013  . Lymphedema of lower extremity 10/17/2013  . 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[place]) 04/04/2013  . MGUS (monoclonal gammopathy of unknown significance) 05/15/2012  . VITAMIN D DEFICIENCY 01/29/2010  . BURSITIS, LEFT ELBOW 01/29/2010  . DERMATITIS 09/30/2009  . DIABETES MELLITUS, BORDERLINE 01/11/2009  . Paranoid delusion ([place]) 08/02/2008  . OBSTRUCTIVE CHRONIC BRONCHITIS 08/02/2008  . Essential hypertension 11/11/2007  . Venous (peripheral) insufficiency 11/11/2007  . RENAL CALCULUS 11/11/2007  . LOW BACK PAIN, CHRONIC 11/11/2007   Amy T Tomasita Morrow, OTR/L, CLT  Lovett,Amy 04/18/2016, 7:56 PM  [place] MAIN [facility] SERVICES [address] [place], [place], [address] Phone: [phone]   Fax:  [phone]  Name: AASHRITHA BRISSETT MRN: [id number] Date of Birth: 03/04/1933

## 2016-04-19 ENCOUNTER — Encounter: Payer: Self-pay | Admitting: Interventional Cardiology

## 2016-04-19 ENCOUNTER — Ambulatory Visit: Payer: Medicare Other | Admitting: Occupational Therapy

## 2016-04-19 ENCOUNTER — Ambulatory Visit (INDEPENDENT_AMBULATORY_CARE_PROVIDER_SITE_OTHER): Payer: Medicare Other | Admitting: Interventional Cardiology

## 2016-04-19 VITALS — BP 136/76 | HR 61 | Ht 59.0 in | Wt 147.6 lb

## 2016-04-19 DIAGNOSIS — N183 Chronic kidney disease, stage 3 unspecified: Secondary | ICD-10-CM

## 2016-04-19 DIAGNOSIS — I10 Essential (primary) hypertension: Secondary | ICD-10-CM | POA: Diagnosis not present

## 2016-04-19 DIAGNOSIS — Z79899 Other long term (current) drug therapy: Secondary | ICD-10-CM

## 2016-04-19 DIAGNOSIS — I5042 Chronic combined systolic (congestive) and diastolic (congestive) heart failure: Secondary | ICD-10-CM

## 2016-04-19 NOTE — Patient Instructions (Signed)
Your physician wants you to follow-up in: [place] will receive a reminder letter in the mail two months in advance. If you don't receive a letter, please call our office to schedule the follow-up appointment.   If you need a refill on your cardiac medications before your next appointment, please call your pharmacy.

## 2016-04-20 ENCOUNTER — Encounter: Payer: Medicare Other | Admitting: Occupational Therapy

## 2016-04-21 ENCOUNTER — Ambulatory Visit: Payer: Medicare Other | Admitting: Occupational Therapy

## 2016-04-21 DIAGNOSIS — I890 Lymphedema, not elsewhere classified: Secondary | ICD-10-CM | POA: Diagnosis not present

## 2016-04-21 NOTE — Therapy (Signed)
[place] MAIN [facility] SERVICES [address] [place], [place], [address] Phone: [phone]   Fax:  [phone]  Occupational Therapy Treatment  Patient Details  Name: [name] MRN: [id number] Date of Birth: January 12, 1932 Referring Provider: Cecilie Kicks, NP for Dr Daneen Schick at [facility] Cardiology  Encounter Date: 04/21/2016      OT End of Session - 04/21/16 0908    Visit Number 6   Number of Visits 36   Date for OT Re-Evaluation 06/14/16   OT Start Time 0900   OT Stop Time 1006   OT Time Calculation (min) 66 min      Past Medical History  Diagnosis Date  . Obstructive chronic bronchitis without exacerbation ([place])   . Unspecified essential hypertension   . Unspecified venous (peripheral) insufficiency   . Edema 2005    "since put the glass in them"   . Other abnormal glucose   . Lumbago   . Unspecified nonpsychotic mental disorder   . Unspecified venous (peripheral) insufficiency   . Unspecified vitamin D deficiency   . Anemia, unspecified   . Anxiety   . Arthritis   . Asthma   . Systolic heart failure (HCC)     EF 35 to 40% per echo January 2015  . Depression   . GERD (gastroesophageal reflux disease)     Past Surgical History  Procedure Laterality Date  . Cholecystectomy    . Tonsillectomy    . Cervical spine surgery      x 2  . Partial hysterectomy    . Lumbar disc surgery    . Esophagogastroduodenoscopy N/A 04/05/2015    Procedure: ESOPHAGOGASTRODUODENOSCOPY (EGD);  Surgeon: [name], MD;  Location: [facility];  Service: Endoscopy;  Laterality: N/A;  . Flexible sigmoidoscopy N/A 04/05/2015    Procedure: FLEXIBLE SIGMOIDOSCOPY;  Surgeon: [name], MD;  Location: [facility];  Service: Endoscopy;  Laterality: N/A;    There were no vitals filed for this visit.      Subjective Assessment - 04/21/16 1422    Subjective  Patient presents for OT vist 6 with compression wraps applied last visit in place.  Pt denies problems tolerating wraps during visit interval. She was grateful that she was able to see alternative therapist.   Patient is accompained by: Family member   Pertinent History Chronic bronchitis, CVI, anxiety , systoloc hert failure, asthma, arthritis, multiple spinal surgeries w/ chronic back pain   Limitations Difficulty walking, decreased standing tolerance, impaired transfer ability, decreased ankle AROM, Pain in BLE, Inability to fit street shoes w/ increased falls risk, increased infection risk.   Patient Stated Goals reduce swelling and legs feel better   Currently in Pain? No/denies   Pain Onset More than a month ago                      OT Treatments/Exercises (OP) - 04/21/16 0001    ADLs   ADL Education Given Yes   Manual Therapy   Manual Therapy Edema management;Compression Bandaging;Other (comment)  skin care to LLE as established   Manual Lymphatic Drainage (MLD) LLE MLD as established   Compression Bandaging LLE gradient compression wraps applied from toes to below knee: toe wrap x1 under cotton stockinet; 8 cm x 5 m x 1 to foot and ankle, 10 cm  x 5 m x 2, 12 cm x5 cm x 1, applied circumferentially in customary layered gradient configuration  over .04 x 10 cm  x 5 m Rosidal Soft x 1.5 .rolls. Added grey foam to dorsum of patient's left foot to assist with decongestion of this area.                 OT Education - 04/21/16 1426    Education provided Yes   Education Details Continued skilled edu for LE self care w/ emphasis on skin care and vigilant skin monitoring   Person(s) Educated Patient;Caregiver(s)             OT Long Term Goals - 04/05/16 1650    OT LONG TERM GOAL #1   Title Lymphedema (LE) self-care: Pt able to don/doff adjustable, alternative compression garments ( aka CircAif, Francia Greaves) between OT visits with Maximal caregiver assistance within 2 weeks to  facilitate improved prognosis and optimal volume reduction during  Intensive CDT.   Baseline dependent   Time 2   Period Weeks   Status New   OT LONG TERM GOAL #2   Title Pt to achieve at least 10% BLE limb volume reductions during Intensive CDT to limit LE progression, decrease infection risk, to reduce foot and leg pain, and to improve ability to fit lower body clothing and shoes.   Baseline dependent   Time 12   Period Weeks   Status New   OT LONG TERM GOAL #3   Title Pt >/= 85 % compliant with all daily, LE self-care protocols for home program, including simple self-manual lymphatic drainage (MLD), skin care, lymphatic pumping the ex, skin care, and donning/ doffing compression wraps and garments with needed level of caregiver assistance to limit LE progression and further functional decline.     Baseline dependent   Time 12   Period Weeks   Status New   OT LONG TERM GOAL #4   Title Lymphedema (LE) self-care : Pt >/= 85 % compliant with all daily LE self-care protocols, including simple self-manual lymphatic drainage (MLD), skin care, lymphatic pumping therex, and donning/ doffing progression garments with needed level of caregiver assistance to limit LE progression, infection risk and further functional decline.     Baseline dependent   Time 12   Period Weeks   Status New   OT LONG TERM GOAL #5   Title Pt to tolerate daily compression wraps, garments and devices in keeping w/ prescribed wear regime within 1 week of issue date to progress and retain clinical and functional gains and to limit LE progression.   Baseline dependent   Time 12   Period Weeks   Status New               Plan - 04/21/16 1428    Clinical Impression Statement Pttolerated MLD without difficulty. By end of session Pt able to perform short neck sequence using proper J stroke technique w/ min A and VC. To date LLE continues to decrease dramatically. Skin folds continue to appear deeper as swelling decreases and increased tenderness to palpation is reported distally  where skin is hardest and thickest. Added custom fabricated Comprex foam "kidneys" to medial and lateral malleoli today and  replaced dorsal foot pad made last session with thinner comprex pad to facilitate increased compression for decongestion in these stubborn fibrotic areas.   Rehab Potential Fair   Clinical Impairments Affecting Rehab Potential Pt presents with decreased prognosis 2/2 overall health status w/ multiple comorbidities and history of non-compliance with previous  lymphedema treatment.   OT Frequency 3x / week   OT Duration 12 weeks  OT Treatment/Interventions Self-care/ADL training;DME and/or AE instruction;Manual lymph drainage;Patient/family education;Compression bandaging;Therapeutic exercise;Therapeutic activities;Manual Therapy   Consulted and Agree with Plan of Care Patient;Family member/caregiver      Patient will benefit from skilled therapeutic intervention in order to improve the following deficits and impairments:  Decreased range of motion, Difficulty walking, Impaired flexibility, Increased edema, Decreased activity tolerance, Decreased knowledge of precautions, Decreased skin integrity, Decreased balance, Decreased knowledge of use of DME, Decreased scar mobility, Pain, Decreased mobility  Visit Diagnosis: Lymphedema    Problem List Patient Active Problem List   Diagnosis Date Noted  . Headache 06/22/2015  . Anemia, iron deficiency 04/05/2015  . Rectal benign neoplasm   . Asthma, chronic 04/04/2015  . history of Tubular adenoma of colon 04/04/2015  . 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 Hypokalemia 04/04/2015  . CKD (chronic kidney disease) stage 3, GFR 30-59 ml/min 04/03/2015  . Dehydration 04/03/2015  . Skin ulcer ([place])   . History of colonic polyps 12/08/2014  . Edema 04/14/2014  . Chronic systolic heart failure ([place]) 11/20/2013  . Nausea with vomiting 11/13/2013  . Lymphedema of lower extremity 10/17/2013  . 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 MGUS (monoclonal gammopathy of unknown significance) 05/15/2012  . VITAMIN D DEFICIENCY 01/29/2010  . BURSITIS, LEFT ELBOW 01/29/2010  . DERMATITIS 09/30/2009  . DIABETES MELLITUS, BORDERLINE 01/11/2009  . Paranoid delusion ([place]) 08/02/2008  . OBSTRUCTIVE CHRONIC BRONCHITIS 08/02/2008  . Essential hypertension 11/11/2007  . Venous (peripheral) insufficiency 11/11/2007  . RENAL CALCULUS 11/11/2007  . LOW BACK PAIN, CHRONIC 11/11/2007    Andrey Spearman, MS, OTR/L, [facility] 04/21/2016 2:34 PM  [place] MAIN [facility] SERVICES [address] [place], [place], [address] Phone: [phone]   Fax:  [phone]  Name: [name] MRN: [id number] Date of Birth: [birth year]

## 2016-04-21 NOTE — Patient Instructions (Signed)
LE instructions and precautions as established- see initial eval.   

## 2016-04-24 ENCOUNTER — Ambulatory Visit: Payer: Medicare Other | Admitting: Occupational Therapy

## 2016-04-24 DIAGNOSIS — I890 Lymphedema, not elsewhere classified: Secondary | ICD-10-CM

## 2016-04-24 NOTE — Patient Instructions (Addendum)
In addition to established self care home program regime and LE precautions, Pt instructed to perform careful skin care every day before re-applying compression.!!  1. Remove LLE compression wraps after 24 hours between visits. Do not leave compression in place for more than 24 hours. Skin should be carefully inspected and cleansed every day.  2. Thouroughly wash and dry skin using antibacterial soap. Use the wash cloth to very gently brush off dry flaking skin that is lose and about to fall off. DO NOT SCRUB LEGS AND FEET ROUGHLY.  3. Generously apply low pH lotion ( aga Eucerin) on feet and legs until it soaks into skin. Wipe off excess with a tissue.   4. Re-apply compression. Don clean stockinett first, then appy adjustable farrow wrap foot pience, and lastly leg piece.

## 2016-04-24 NOTE — Therapy (Signed)
[place] MAIN [facility] SERVICES [address] [place], [place], [address] Phone: [phone]   Fax:  [phone]  Occupational Therapy Treatment  Patient Details  Name: [name] MRN: [id number] Date of Birth: Nov 18, 1931 Referring Provider: Cecilie Kicks, NP for Dr Daneen Schick at [facility]  Encounter Date: 04/24/2016      OT End of Session - 04/24/16 1223    Visit Number 7   Number of Visits 36   Date for OT Re-Evaluation 06/14/16   OT Start Time 0905   OT Stop Time 1039   OT Time Calculation (min) 94 min      Past Medical History  Diagnosis Date  . Obstructive chronic bronchitis without exacerbation ([place])   . Unspecified essential hypertension   . Unspecified venous (peripheral) insufficiency   . Edema 2005    "since put the glass in them"   . Other abnormal glucose   . Lumbago   . Unspecified nonpsychotic mental disorder   . Unspecified venous (peripheral) insufficiency   . Unspecified vitamin D deficiency   . Anemia, unspecified   . Anxiety   . Arthritis   . Asthma   . Systolic heart failure (HCC)     EF 35 to 40% per echo January 2015  . Depression   . GERD (gastroesophageal reflux disease)     Past Surgical History  Procedure Laterality Date  . Cholecystectomy    . Tonsillectomy    . Cervical spine surgery      x 2  . Partial hysterectomy    . Lumbar disc surgery    . Esophagogastroduodenoscopy N/A 04/05/2015    Procedure: ESOPHAGOGASTRODUODENOSCOPY (EGD);  Surgeon: [name], MD;  Location: [facility];  Service: Endoscopy;  Laterality: N/A;  . Flexible sigmoidoscopy N/A 04/05/2015    Procedure: FLEXIBLE SIGMOIDOSCOPY;  Surgeon: [name], MD;  Location: [facility];  Service: Endoscopy;  Laterality: N/A;    There were no vitals filed for this visit.      Subjective Assessment - 04/24/16 1101    Subjective  Patient presents for OT vist 7 with compression wraps applied last visit in place.  Wraps were not removed between sessions and skin care was not performed. Pt tells me she has HOS assistance from paid sitter, but  they do not provide personal care services.   Patient is accompained by: Family member   Pertinent History Chronic bronchitis, CVI, anxiety , systoloc hert failure, asthma, arthritis, multiple spinal surgeries w/ chronic back pain   Limitations Difficulty walking, decreased standing tolerance, impaired transfer ability, decreased ankle AROM, Pain in BLE, Inability to fit street shoes w/ increased falls risk, increased infection risk.   Patient Stated Goals reduce swelling and legs feel better   Currently in Pain? No/denies   Pain Onset More than a month ago                      OT Treatments/Exercises (OP) - 04/24/16 0001    ADLs   ADL Education Given Yes   Manual Therapy   Manual Therapy Edema management;Compression Bandaging;Other (comment)   Edema Management extensive skin care today. LLE bathed w/ soap and water and dried  thouroughly.. Dried, flaking skin removed gently w/ dry. clean wash cloth. Repeated 2 x. Applied Eucerin low pH lotion generously to improve skin hydration.   Compression Bandaging LLE gradient compression wraps applied from toes to below knee: toe wrap x1 w/ CoWrap under cotton stockinet;  8 cm x 5 m x 1 to foot and ankle, 10 cm  x 5 m x 1 OMITTED  12 cm x5 cm x 1 today as leg is nearly fully decongested and using fewer wraps to limit risk of bandage fatigue.  NO SUPPLEMENTAL MALLEOLI OR DORSAL FOOT PADS TODAY  in effort to give skin a rest from  stress of added compression.                OT Education - 04/24/16 1220    Education Details Pt education for alternative adjustable compression devices and supplies, including TG soft terry cloth stockinett and [place] Wrap leg and foot pieces. Reviewed skin care regime and skin precautions in detail. Practiced donning and doffing loaned [employer].    Person(s) Educated  Patient;Caregiver(s)   Methods Explanation;Demonstration;Tactile cues;Verbal cues;Handout   Comprehension Verbalized understanding;Returned demonstration;Tactile cues required;Verbal cues required;Need further instruction             OT Long Term Goals - 04/05/16 1650    OT LONG TERM GOAL #1   Title Lymphedema (LE) self-care: Pt able to don/doff adjustable, alternative compression garments ( aka CircAif, Francia Greaves) between OT visits with Maximal caregiver assistance within 2 weeks to  facilitate improved prognosis and optimal volume reduction during Intensive CDT.   Baseline dependent   Time 2   Period Weeks   Status New   OT LONG TERM GOAL #2   Title Pt to achieve at least 10% BLE limb volume reductions during Intensive CDT to limit LE progression, decrease infection risk, to reduce foot and leg pain, and to improve ability to fit lower body clothing and shoes.   Baseline dependent   Time 12   Period Weeks   Status New   OT LONG TERM GOAL #3   Title Pt >/= 85 % compliant with all daily, LE self-care protocols for home program, including simple self-manual lymphatic drainage (MLD), skin care, lymphatic pumping the ex, skin care, and donning/ doffing compression wraps and garments with needed level of caregiver assistance to limit LE progression and further functional decline.     Baseline dependent   Time 12   Period Weeks   Status New   OT LONG TERM GOAL #4   Title Lymphedema (LE) self-care : Pt >/= 85 % compliant with all daily LE self-care protocols, including simple self-manual lymphatic drainage (MLD), skin care, lymphatic pumping therex, and donning/ doffing progression garments with needed level of caregiver assistance to limit LE progression, infection risk and further functional decline.     Baseline dependent   Time 12   Period Weeks   Status New   OT LONG TERM GOAL #5   Title Pt to tolerate daily compression wraps, garments and devices in keeping w/ prescribed wear  regime within 1 week of issue date to progress and retain clinical and functional gains and to limit LE progression.   Baseline dependent   Time 12   Period Weeks   Status New               Plan - 04/24/16 1223    Clinical Impression Statement Emphasis of visit today on skin care and Pt edu for LE self care and compression between sessions. By end of session Pt able to teach back skin care regime and routine for donning/ offing compression and inspecting skin. Pt able to don loaned [employer] on L leg  2 x after initial demonstration without assistance or cues. Pt  verbalized importance of removing compression wraps every day to inspect skin and perform diligent skin care daily. We reviewed skin care regime in depth. Pt understands her skin is fragile and showing signs of bandage fatigue at present. Without careful self cre every day  skin may break down interrupting OT for CDT again.   Rehab Potential Fair   Clinical Impairments Affecting Rehab Potential Pt presents with decreased prognosis 2/2 overall health status w/ multiple comorbidities and history of non-compliance with previous  lymphedema treatment.   OT Frequency 3x / week   OT Duration 12 weeks   OT Treatment/Interventions Self-care/ADL training;DME and/or AE instruction;Manual lymph drainage;Patient/family education;Compression bandaging;Therapeutic exercise;Therapeutic activities;Manual Therapy   Consulted and Agree with Plan of Care Patient;Family member/caregiver      Patient will benefit from skilled therapeutic intervention in order to improve the following deficits and impairments:  Decreased range of motion, Difficulty walking, Impaired flexibility, Increased edema, Decreased activity tolerance, Decreased knowledge of precautions, Decreased skin integrity, Decreased balance, Decreased knowledge of use of DME, Decreased scar mobility, Pain, Decreased mobility  Visit Diagnosis: Lymphedema    Problem  List Patient Active Problem List   Diagnosis Date Noted  . Headache 06/22/2015  . Anemia, iron deficiency 04/05/2015  . Rectal benign neoplasm   . Asthma, chronic 04/04/2015  . history of Tubular adenoma of colon 04/04/2015  . 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 Hypokalemia 04/04/2015  . CKD (chronic kidney disease) stage 3, GFR 30-59 ml/min 04/03/2015  . Dehydration 04/03/2015  . Skin ulcer ([place])   . History of colonic polyps 12/08/2014  . Edema 04/14/2014  . Chronic systolic heart failure ([place]) 11/20/2013  . Nausea with vomiting 11/13/2013  . Lymphedema of lower extremity 10/17/2013  . 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[place]) 04/04/2013  . MGUS (monoclonal gammopathy of unknown significance) 05/15/2012  . VITAMIN D DEFICIENCY 01/29/2010  . BURSITIS, LEFT ELBOW 01/29/2010  . DERMATITIS 09/30/2009  . DIABETES MELLITUS, BORDERLINE 01/11/2009  . Paranoid delusion ([place]) 08/02/2008  . OBSTRUCTIVE CHRONIC BRONCHITIS 08/02/2008  . Essential hypertension 11/11/2007  . Venous (peripheral) insufficiency 11/11/2007  . RENAL CALCULUS 11/11/2007  . LOW BACK PAIN, CHRONIC 11/11/2007    Andrey Spearman, MS, OTR/L, [facility] 04/24/2016 12:28 PM  [place] MAIN [facility] SERVICES [address] [place], [place], [address] Phone: [phone]   Fax:  [phone]  Name: [name] MRN: [id number] Date of Birth: 1932/06/09

## 2016-04-25 ENCOUNTER — Other Ambulatory Visit: Payer: Self-pay | Admitting: Interventional Cardiology

## 2016-04-25 ENCOUNTER — Other Ambulatory Visit: Payer: Self-pay | Admitting: Pulmonary Disease

## 2016-04-26 ENCOUNTER — Ambulatory Visit: Payer: Medicare Other | Admitting: Occupational Therapy

## 2016-04-26 DIAGNOSIS — I890 Lymphedema, not elsewhere classified: Secondary | ICD-10-CM | POA: Diagnosis not present

## 2016-04-27 ENCOUNTER — Encounter: Payer: Self-pay | Admitting: Neurology

## 2016-04-27 ENCOUNTER — Ambulatory Visit (INDEPENDENT_AMBULATORY_CARE_PROVIDER_SITE_OTHER): Payer: Medicare Other | Admitting: Neurology

## 2016-04-27 ENCOUNTER — Other Ambulatory Visit: Payer: Self-pay | Admitting: Pulmonary Disease

## 2016-04-27 VITALS — BP 134/70 | HR 72 | Ht 60.0 in | Wt 144.0 lb

## 2016-04-27 DIAGNOSIS — G44329 Chronic post-traumatic headache, not intractable: Secondary | ICD-10-CM | POA: Diagnosis not present

## 2016-04-27 MED ORDER — GABAPENTIN 100 MG PO CAPS: 200.0000 mg | ORAL_CAPSULE | Freq: Every day | ORAL | Status: DC
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
## 2016-04-27 NOTE — Patient Instructions (Signed)
1.  We will increase gabapentin to 200mg  at bedtime 2.  We will get a Education officer, museum to come by the house and make an assessment regarding safety. 3.  Follow up in 6 months.

## 2016-04-28 ENCOUNTER — Ambulatory Visit: Payer: Medicare Other | Admitting: Occupational Therapy

## 2016-04-28 ENCOUNTER — Encounter: Payer: Self-pay | Admitting: Occupational Therapy

## 2016-04-28 DIAGNOSIS — I890 Lymphedema, not elsewhere classified: Secondary | ICD-10-CM | POA: Diagnosis not present

## 2016-04-28 NOTE — Therapy (Signed)
[place] MAIN [facility] SERVICES [address] [place], [place], [address] Phone: [phone]   Fax:  [phone]  Occupational Therapy Treatment  Patient Details  Name: [name] MRN: [id number] Date of Birth: 03/31/1932 Referring Provider: Cecilie Kicks, NP for Dr Daneen Schick at [facility]  Encounter Date: 04/28/2016      OT End of Session - 04/28/16 1348    Visit Number 8   Number of Visits 36   Date for OT Re-Evaluation 06/14/16   OT Start Time 0907   OT Stop Time 1009   OT Time Calculation (min) 62 min      Past Medical History  Diagnosis Date  . Obstructive chronic bronchitis without exacerbation ([place])   . Unspecified essential hypertension   . Unspecified venous (peripheral) insufficiency   . Edema 2005    "since put the glass in them"   . Other abnormal glucose   . Lumbago   . Unspecified nonpsychotic mental disorder   . Unspecified venous (peripheral) insufficiency   . Unspecified vitamin D deficiency   . Anemia, unspecified   . Anxiety   . Arthritis   . Asthma   . Systolic heart failure (HCC)     EF 35 to 40% per echo January 2015  . Depression   . GERD (gastroesophageal reflux disease)     Past Surgical History  Procedure Laterality Date  . Cholecystectomy    . Tonsillectomy    . Cervical spine surgery      x 2  . Partial hysterectomy    . Lumbar disc surgery    . Esophagogastroduodenoscopy N/A 04/05/2015    Procedure: ESOPHAGOGASTRODUODENOSCOPY (EGD);  Surgeon: [name], MD;  Location: [facility];  Service: Endoscopy;  Laterality: N/A;  . Flexible sigmoidoscopy N/A 04/05/2015    Procedure: FLEXIBLE SIGMOIDOSCOPY;  Surgeon: [name], MD;  Location: [facility];  Service: Endoscopy;  Laterality: N/A;    There were no vitals filed for this visit.      Subjective Assessment - 04/28/16 0913    Subjective  Patient presents for OT vist 8with compression wraps in place. Pt reports she was  able to doff wraps independently between visits, perform skin care regime and don loaned Farrow wrap independently. w/ extra time.    Patient is accompained by: Family member   Limitations Difficulty walking, decreased standing tolerance, impaired transfer ability, decreased ankle AROM, Pain in BLE, Inability to fit street shoes w/ increased falls risk, increased infection risk.   Currently in Pain? Yes   Pain Score --  not rated numerically   Pain Location Leg   Pain Orientation Left                      OT Treatments/Exercises (OP) - 04/28/16 0001    ADLs   ADL Education Given Yes   Manual Therapy   Manual Therapy Edema management;Compression Bandaging;Other (comment)   Edema Management extensive skin care today. LLE bathed w/ soap and water and dried  thouroughly.. Dried, flaking skin removed gently w/ dry. clean wash cloth. Repeated 2 x. Applied Eucerin low pH lotion generously to improve skin hydration.   Manual Lymphatic Drainage (MLD) LLE MLD as established   Compression Bandaging LLE gradient compression wraps applied from toes to below knee: toe wrap x1 w/ CoWrap under cotton stockinet; 8 cm x 5 m x 1 to foot and ankle, 10 cm  x 5 m x 1 OMITTED  12  cm x5 cm x 1 today as leg is nearly fully decongested and using fewer wraps to limit risk of bandage fatigue.  NO SUPPLEMENTAL MALLEOLI OR DORSAL FOOT PADS TODAY  in effort to give skin a rest from  stress of added compression.                OT Education - 04/28/16 1346    Education Details Pt able tp perform neck and leg sequence today w/ max A, including cues and demonstration. Pt agrees to practice simple self MLD between sessions. Printed handout given   Methods Explanation;Demonstration;Tactile cues;Verbal cues;Handout   Comprehension Verbalized understanding;Returned demonstration;Verbal cues required;Tactile cues required;Need further instruction             OT Long Term Goals - 04/05/16 1650    OT  LONG TERM GOAL #1   Title Lymphedema (LE) self-care: Pt able to don/doff adjustable, alternative compression garments ( aka CircAif, Francia Greaves) between OT visits with Maximal caregiver assistance within 2 weeks to  facilitate improved prognosis and optimal volume reduction during Intensive CDT.   Baseline dependent   Time 2   Period Weeks   Status New   OT LONG TERM GOAL #2   Title Pt to achieve at least 10% BLE limb volume reductions during Intensive CDT to limit LE progression, decrease infection risk, to reduce foot and leg pain, and to improve ability to fit lower body clothing and shoes.   Baseline dependent   Time 12   Period Weeks   Status New   OT LONG TERM GOAL #3   Title Pt >/= 85 % compliant with all daily, LE self-care protocols for home program, including simple self-manual lymphatic drainage (MLD), skin care, lymphatic pumping the ex, skin care, and donning/ doffing compression wraps and garments with needed level of caregiver assistance to limit LE progression and further functional decline.     Baseline dependent   Time 12   Period Weeks   Status New   OT LONG TERM GOAL #4   Title Lymphedema (LE) self-care : Pt >/= 85 % compliant with all daily LE self-care protocols, including simple self-manual lymphatic drainage (MLD), skin care, lymphatic pumping therex, and donning/ doffing progression garments with needed level of caregiver assistance to limit LE progression, infection risk and further functional decline.     Baseline dependent   Time 12   Period Weeks   Status New   OT LONG TERM GOAL #5   Title Pt to tolerate daily compression wraps, garments and devices in keeping w/ prescribed wear regime within 1 week of issue date to progress and retain clinical and functional gains and to limit LE progression.   Baseline dependent   Time 12   Period Weeks   Status New               Plan - 04/28/16 1352    Clinical Impression Statement Skin was able to complete  doff bandages, perform skin care regime, and don adjustable loaned [employer] between sessions. Skin appears less urritated today w/ less redness. Hydration appears improved and skin flaking is minimum vs moderate todAY. nO ODOR OF FUNGUS NOTED AS IT WAS LAST SESSION.   Rehab Potential Fair   Clinical Impairments Affecting Rehab Potential Pt presents with decreased prognosis 2/2 overall health status w/ multiple comorbidities and history of non-compliance with previous  lymphedema treatment.   OT Frequency 3x / week   OT Duration 12 weeks   OT Treatment/Interventions Self-care/ADL  training;DME and/or AE instruction;Manual lymph drainage;Patient/family education;Compression bandaging;Therapeutic exercise;Therapeutic activities;Manual Therapy   Consulted and Agree with Plan of Care Patient;Family member/caregiver      Patient will benefit from skilled therapeutic intervention in order to improve the following deficits and impairments:  Decreased range of motion, Difficulty walking, Impaired flexibility, Increased edema, Decreased activity tolerance, Decreased knowledge of precautions, Decreased skin integrity, Decreased balance, Decreased knowledge of use of DME, Decreased scar mobility, Pain, Decreased mobility  Visit Diagnosis: Lymphedema    Problem List Patient Active Problem List   Diagnosis Date Noted  . Headache 06/22/2015  . Anemia, iron deficiency 04/05/2015  . Rectal benign neoplasm   . Asthma, chronic 04/04/2015  . history of Tubular adenoma of colon 04/04/2015  . 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 Hypokalemia 04/04/2015  . CKD (chronic kidney disease) stage 3, GFR 30-59 ml/min 04/03/2015  . Dehydration 04/03/2015  . Skin ulcer ([place])   . History of colonic polyps 12/08/2014  . Edema 04/14/2014  . Chronic systolic heart failure ([place]) 11/20/2013  . Nausea with vomiting 11/13/2013  . Lymphedema of lower extremity 10/17/2013  . 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[place]) 04/04/2013   . MGUS (monoclonal gammopathy of unknown significance) 05/15/2012  . VITAMIN D DEFICIENCY 01/29/2010  . BURSITIS, LEFT ELBOW 01/29/2010  . DERMATITIS 09/30/2009  . DIABETES MELLITUS, BORDERLINE 01/11/2009  . Paranoid delusion ([place]) 08/02/2008  . OBSTRUCTIVE CHRONIC BRONCHITIS 08/02/2008  . Essential hypertension 11/11/2007  . Venous (peripheral) insufficiency 11/11/2007  . RENAL CALCULUS 11/11/2007  . LOW BACK PAIN, CHRONIC 11/11/2007    Andrey Spearman, MS, OTR/L, [facility] 04/28/2016 1:55 PM  [place] MAIN [facility] SERVICES [address] [place], [place], [address] Phone: [phone]   Fax:  [phone]  Name: [name] MRN: [id number] Date of Birth: December 13, 1931

## 2016-04-28 NOTE — Therapy (Signed)
[place] MAIN [facility] SERVICES [address] [place], [place], [address] Phone: [phone]   Fax:  [phone]  Occupational Therapy Treatment  Patient Details  Name: [name] MRN: [id number] Date of Birth: 1932-04-20 Referring Provider: Cecilie Kicks, NP for Dr Daneen Schick at [facility]  Encounter Date: 04/26/2016      OT End of Session - 04/28/16 1650    Visit Number 8   Number of Visits 36   Date for OT Re-Evaluation 06/14/16   Authorization Type [id number]   Activity Tolerance Patient tolerated treatment well;No increased pain   Behavior During Therapy [facility] for tasks assessed/performed      Past Medical History  Diagnosis Date  . Obstructive chronic bronchitis without exacerbation ([place])   . Unspecified essential hypertension   . Unspecified venous (peripheral) insufficiency   . Edema 2005    "since put the glass in them"   . Other abnormal glucose   . Lumbago   . Unspecified nonpsychotic mental disorder   . Unspecified venous (peripheral) insufficiency   . Unspecified vitamin D deficiency   . Anemia, unspecified   . Anxiety   . Arthritis   . Asthma   . Systolic heart failure (HCC)     EF 35 to 40% per echo January 2015  . Depression   . GERD (gastroesophageal reflux disease)     Past Surgical History  Procedure Laterality Date  . Cholecystectomy    . Tonsillectomy    . Cervical spine surgery      x 2  . Partial hysterectomy    . Lumbar disc surgery    . Esophagogastroduodenoscopy N/A 04/05/2015    Procedure: ESOPHAGOGASTRODUODENOSCOPY (EGD);  Surgeon: [name], MD;  Location: [facility];  Service: Endoscopy;  Laterality: N/A;  . Flexible sigmoidoscopy N/A 04/05/2015    Procedure: FLEXIBLE SIGMOIDOSCOPY;  Surgeon: [name], MD;  Location: [facility];  Service: Endoscopy;  Laterality: N/A;    There were no vitals filed for this visit.      Subjective Assessment - 04/28/16 1650    Subjective   Patient arrived 30 minutes late to appointment, therapist could only perform compression wrapping with patient due to time constraints.   Patient is accompained by: Family member   Pertinent History Chronic bronchitis, CVI, anxiety , systoloc hert failure, asthma, arthritis, multiple spinal surgeries w/ chronic back pain   Limitations Difficulty walking, decreased standing tolerance, impaired transfer ability, decreased ankle AROM, Pain in BLE, Inability to fit street shoes w/ increased falls risk, increased infection risk.   Patient Stated Goals reduce swelling and legs feel better                      OT Treatments/Exercises (OP) - 04/28/16 1650    Manual Therapy   Manual Therapy Edema management;Compression Bandaging;Other (comment)   Edema Management extensive skin care today. LLE bathed w/ soap and water and dried  thouroughly.. Dried, flaking skin removed gently w/ dry. clean wash cloth. Repeated 2 x. Applied low PH castor oil generously to improve skin hydration.   Compression Bandaging LLE gradient compression wraps applied from toes to below knee: toe wrap x1 under cotton stockinet; 8 cm x 5 m x 1 to foot and ankle, 10 cm x 5 m x 2, 12 cm x5 cm x 1, applied circumferentially in customary layered gradient configuration over .04 x 10 cm x 5 m Rosidal Soft x 1.5 .rolls.  foam to dorsum  of patient's left foot to assist with decongestion of this area. as well as both sides of malleoli.                OT Education - 04/28/16 1650    Education provided Yes             OT Long Term Goals - 04/05/16 1650    OT LONG TERM GOAL #1   Title Lymphedema (LE) self-care: Pt able to don/doff adjustable, alternative compression garments ( aka CircAif, Francia Greaves) between OT visits with Maximal caregiver assistance within 2 weeks to  facilitate improved prognosis and optimal volume reduction during Intensive CDT.   Baseline dependent   Time 2   Period Weeks   Status New    OT LONG TERM GOAL #2   Title Pt to achieve at least 10% BLE limb volume reductions during Intensive CDT to limit LE progression, decrease infection risk, to reduce foot and leg pain, and to improve ability to fit lower body clothing and shoes.   Baseline dependent   Time 12   Period Weeks   Status New   OT LONG TERM GOAL #3   Title Pt >/= 85 % compliant with all daily, LE self-care protocols for home program, including simple self-manual lymphatic drainage (MLD), skin care, lymphatic pumping the ex, skin care, and donning/ doffing compression wraps and garments with needed level of caregiver assistance to limit LE progression and further functional decline.     Baseline dependent   Time 12   Period Weeks   Status New   OT LONG TERM GOAL #4   Title Lymphedema (LE) self-care : Pt >/= 85 % compliant with all daily LE self-care protocols, including simple self-manual lymphatic drainage (MLD), skin care, lymphatic pumping therex, and donning/ doffing progression garments with needed level of caregiver assistance to limit LE progression, infection risk and further functional decline.     Baseline dependent   Time 12   Period Weeks   Status New   OT LONG TERM GOAL #5   Title Pt to tolerate daily compression wraps, garments and devices in keeping w/ prescribed wear regime within 1 week of issue date to progress and retain clinical and functional gains and to limit LE progression.   Baseline dependent   Time 12   Period Weeks   Status New               Plan - 04/28/16 1650    Clinical Impression Statement Patient arrived without bandages on this date, skin with improved hydration, minimal flaking.  Farrow Wrap applied to right leg and compression bandaging to left.  Did not take any circumferential measurements due to patient arriving late to appointment and time constraints.  Encouraged patient to arrive on time to receive the full benefit of therapy.  Minimal MLD techniques this date to  the dorsum of the left foot prior to applying compression.     Rehab Potential Fair   Clinical Impairments Affecting Rehab Potential Pt presents with decreased prognosis 2/2 overall health status w/ multiple comorbidities and history of non-compliance with previous  lymphedema treatment.   OT Frequency 3x / week   OT Duration 12 weeks   OT Treatment/Interventions Self-care/ADL training;DME and/or AE instruction;Manual lymph drainage;Patient/family education;Compression bandaging;Therapeutic exercise;Therapeutic activities;Manual Therapy   Consulted and Agree with Plan of Care Patient;Family member/caregiver      Patient will benefit from skilled therapeutic intervention in order to improve the following deficits and impairments:  Decreased  range of motion, Difficulty walking, Impaired flexibility, Increased edema, Decreased activity tolerance, Decreased knowledge of precautions, Decreased skin integrity, Decreased balance, Decreased knowledge of use of DME, Decreased scar mobility, Pain, Decreased mobility  Visit Diagnosis: Lymphedema  Lymphedema of left lower extremity    Problem List Patient Active Problem List   Diagnosis Date Noted  . Headache 06/22/2015  . Anemia, iron deficiency 04/05/2015  . Rectal benign neoplasm   . Asthma, chronic 04/04/2015  . history of Tubular adenoma of colon 04/04/2015  . 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 Hypokalemia 04/04/2015  . CKD (chronic kidney disease) stage 3, GFR 30-59 ml/min 04/03/2015  . Dehydration 04/03/2015  . Skin ulcer ([place])   . History of colonic polyps 12/08/2014  . Edema 04/14/2014  . Chronic systolic heart failure ([place]) 11/20/2013  . Nausea with vomiting 11/13/2013  . Lymphedema of lower extremity 10/17/2013  . 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 MGUS (monoclonal gammopathy of unknown significance) 05/15/2012  . VITAMIN D DEFICIENCY 01/29/2010  . BURSITIS, LEFT ELBOW 01/29/2010  . DERMATITIS 09/30/2009  .  DIABETES MELLITUS, BORDERLINE 01/11/2009  . Paranoid delusion ([place]) 08/02/2008  . OBSTRUCTIVE CHRONIC BRONCHITIS 08/02/2008  . Essential hypertension 11/11/2007  . Venous (peripheral) insufficiency 11/11/2007  . RENAL CALCULUS 11/11/2007  . LOW BACK PAIN, CHRONIC 11/11/2007   Amy T Tomasita Morrow, OTR/L, CLT  Lovett,Amy 04/28/2016, 4:51 PM  [place] MAIN [facility] SERVICES [address] [place], [place], [address] Phone: [phone]   Fax:  [phone]  Name: [name] MRN: [id number] Date of Birth: [birth year]

## 2016-04-28 NOTE — Patient Instructions (Signed)
LE instructions and precautions as established- see initial eval.   

## 2016-05-01 ENCOUNTER — Ambulatory Visit: Payer: Medicare Other | Attending: Cardiology | Admitting: Occupational Therapy

## 2016-05-01 DIAGNOSIS — I890 Lymphedema, not elsewhere classified: Secondary | ICD-10-CM | POA: Diagnosis not present

## 2016-05-01 NOTE — Therapy (Signed)
[place] MAIN [facility] SERVICES [address] [place], [place], [address] Phone: [phone]   Fax:  [phone]  Occupational Therapy Treatment & Progress Note  Patient Details  Name: [name] MRN: [id number] Date of Birth: Jan 15, 1932 Referring Provider: Cecilie Kicks, NP for Dr Daneen Schick at [facility]  Encounter Date: 05/01/2016      OT End of Session - 05/01/16 1626    Visit Number 10   Number of Visits 36   Date for OT Re-Evaluation 06/14/16   OT Start Time 0915   OT Stop Time 1006   OT Time Calculation (min) 51 min      Past Medical History  Diagnosis Date  . Obstructive chronic bronchitis without exacerbation ([place])   . Unspecified essential hypertension   . Unspecified venous (peripheral) insufficiency   . Edema 2005    "since put the glass in them"   . Other abnormal glucose   . Lumbago   . Unspecified nonpsychotic mental disorder   . Unspecified venous (peripheral) insufficiency   . Unspecified vitamin D deficiency   . Anemia, unspecified   . Anxiety   . Arthritis   . Asthma   . Systolic heart failure (HCC)     EF 35 to 40% per echo January 2015  . Depression   . GERD (gastroesophageal reflux disease)     Past Surgical History  Procedure Laterality Date  . Cholecystectomy    . Tonsillectomy    . Cervical spine surgery      x 2  . Partial hysterectomy    . Lumbar disc surgery    . Esophagogastroduodenoscopy N/A 04/05/2015    Procedure: ESOPHAGOGASTRODUODENOSCOPY (EGD);  Surgeon: [name], MD;  Location: [facility];  Service: Endoscopy;  Laterality: N/A;  . Flexible sigmoidoscopy N/A 04/05/2015    Procedure: FLEXIBLE SIGMOIDOSCOPY;  Surgeon: [name], MD;  Location: [facility];  Service: Endoscopy;  Laterality: N/A;    There were no vitals filed for this visit.      Subjective Assessment - 05/01/16 1624    Subjective  Pt presents for OT visit 10 to address BLE lymphedema. Pt is  accompanied by her niece today. She is not wearing compression wraps as recommended. "I took them off late last night when I got home. I was so tired. I did do my skin care though."   Patient is accompained by: Family member   Pertinent History Chronic bronchitis, CVI, anxiety , systoloc hert failure, asthma, arthritis, multiple spinal surgeries w/ chronic back pain   Limitations Difficulty walking, decreased standing tolerance, impaired transfer ability, decreased ankle AROM, Pain in BLE, Inability to fit street shoes w/ increased falls risk, increased infection risk.   Patient Stated Goals reduce swelling and legs feel better   Currently in Pain? No/denies   Pain Onset More than a month ago                      OT Treatments/Exercises (OP) - 05/01/16 0001    Manual Therapy   Manual Therapy Edema management;Compression Bandaging;Manual Lymphatic Drainage (MLD)   Edema Management extensive skin care today. LLE bathed w/ soap and water and dried  thouroughly.. Dried, flaking skin removed gently w/ dry. clean wash cloth. Repeated 2 x. Applied low PH castor oil generously to improve skin hydration.   Manual Lymphatic Drainage (MLD) LLE MLD as established   Compression Bandaging LLE gradient compression wraps applied from toes to below knee:  toe wrap x1 under cotton stockinet; 8 cm x 5 m x 1 to foot and ankle, 10 cm x 5 m x 2, 12 cm x5 cm x 1, applied circumferentially in customary layered gradient configuration over .04 x 10 cm x 5 m Rosidal Soft x 1.5 .rolls.  foam to dorsum of patient's left foot to assist with decongestion of this area. as well as both sides of malleoli.                OT Education - 05/01/16 1625    Education provided No             OT Long Term Goals - 05/01/16 1631    OT LONG TERM GOAL #1   Title Lymphedema (LE) self-care: Pt able to don/doff adjustable, alternative compression garments ( aka CircAif, Francia Greaves) between OT visits with Maximal  caregiver assistance within 2 weeks to  facilitate improved prognosis and optimal volume reduction during Intensive CDT.   Baseline dependent   Time 2   Period Weeks   Status Achieved   OT LONG TERM GOAL #2   Title Pt to achieve at least 10% BLE limb volume reductions during Intensive CDT to limit LE progression, decrease infection risk, to reduce foot and leg pain, and to improve ability to fit lower body clothing and shoes.   Baseline dependent   Time 12   Period Weeks   Status Partially Met   OT LONG TERM GOAL #3   Title Pt >/= 85 % compliant with all daily, LE self-care protocols for home program, including simple self-manual lymphatic drainage (MLD), skin care, lymphatic pumping the ex, skin care, and donning/ doffing compression wraps and garments with needed level of caregiver assistance to limit LE progression and further functional decline.     Baseline dependent   Time 12   Period Weeks   Status Partially Met   OT LONG TERM GOAL #4   Title Lymphedema (LE) self-care : Pt >/= 85 % compliant with all daily LE self-care protocols, including simple self-manual lymphatic drainage (MLD), skin care, lymphatic pumping therex, and donning/ doffing progression garments with needed level of caregiver assistance to limit LE progression, infection risk and further functional decline.     Baseline dependent   Time 12   Period Weeks   Status Partially Met   OT LONG TERM GOAL #5   Title Pt to tolerate daily compression wraps, garments and devices in keeping w/ prescribed wear regime within 1 week of issue date to progress and retain clinical and functional gains and to limit LE progression.   Baseline dependent   Time 12   Period Weeks   Status Partially Met               Plan - 05/01/16 1628    Clinical Impression Statement LLE skin condition continues to improve, but remains fragile. Mulltiple shallow nicks into skin on anterior RLE. Pt not sure of origine. Strongly encouraged Pt  to perform bilateral skin care diligently. Ankle more congested on L today also w/ increased  tissue swelling and density.    Rehab Potential Fair   Clinical Impairments Affecting Rehab Potential Pt presents with decreased prognosis 2/2 overall health status w/ multiple comorbidities and history of non-compliance with previous  lymphedema treatment.   OT Frequency 3x / week   OT Duration 12 weeks   OT Treatment/Interventions Self-care/ADL training;DME and/or AE instruction;Manual lymph drainage;Patient/family education;Compression bandaging;Therapeutic exercise;Therapeutic activities;Manual Therapy   Consulted and Agree  with Plan of Care Patient;Family member/caregiver      Patient will benefit from skilled therapeutic intervention in order to improve the following deficits and impairments:  Decreased range of motion, Difficulty walking, Impaired flexibility, Increased edema, Decreased activity tolerance, Decreased knowledge of precautions, Decreased skin integrity, Decreased balance, Decreased knowledge of use of DME, Decreased scar mobility, Pain, Decreased mobility  Visit Diagnosis: Lymphedema      G-Codes - May 31, 2016 1633    Functional Assessment Tool Used Clinical observation and examination, medical records review, Pt and caregiver interview, comparative limb volumetrics   Functional Limitation Self care   Self Care Current Status (E5277) At least 60 percent but less than 80 percent impaired, limited or restricted   Self Care Goal Status (O2423) At least 40 percent but less than 60 percent impaired, limited or restricted      Problem List Patient Active Problem List   Diagnosis Date Noted  . Headache 06/22/2015  . Anemia, iron deficiency 04/05/2015  . Rectal benign neoplasm   . Asthma, chronic 04/04/2015  . history of Tubular adenoma of colon 04/04/2015  . 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 Hypokalemia 04/04/2015  . CKD (chronic kidney disease) stage 3, GFR 30-59  ml/min 04/03/2015  . Dehydration 04/03/2015  . Skin ulcer ([place])   . History of colonic polyps 12/08/2014  . Edema 04/14/2014  . Chronic systolic heart failure ([place]) 11/20/2013  . Nausea with vomiting 11/13/2013  . Lymphedema of lower extremity 10/17/2013  . 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[place]) 04/04/2013  . MGUS (monoclonal gammopathy of unknown significance) 05/15/2012  . VITAMIN D DEFICIENCY 01/29/2010  . BURSITIS, LEFT ELBOW 01/29/2010  . DERMATITIS 09/30/2009  . DIABETES MELLITUS, BORDERLINE 01/11/2009  . Paranoid delusion ([place]) 08/02/2008  . OBSTRUCTIVE CHRONIC BRONCHITIS 08/02/2008  . Essential hypertension 11/11/2007  . Venous (peripheral) insufficiency 11/11/2007  . RENAL CALCULUS 11/11/2007  . LOW BACK PAIN, CHRONIC 11/11/2007   Andrey Spearman, MS, OTR/L, [facility] 05/31/16 4:36 PM  [place] MAIN [facility] SERVICES [address] [place], [place], [address] Phone: [phone]   Fax:  [phone]  Name: [name] MRN: [id number] Date of Birth: 11-07-1931

## 2016-05-01 NOTE — Patient Instructions (Signed)
LE instructions and precautions as established- see initial eval.   

## 2016-05-03 ENCOUNTER — Ambulatory Visit: Payer: Medicare Other | Admitting: Occupational Therapy

## 2016-05-03 DIAGNOSIS — I890 Lymphedema, not elsewhere classified: Secondary | ICD-10-CM | POA: Diagnosis not present

## 2016-05-03 NOTE — Patient Instructions (Signed)
LE instructions and precautions as established- see initial eval.   

## 2016-05-03 NOTE — Therapy (Signed)
[place] MAIN [facility] SERVICES [address] [place], [place], [address] Phone: [phone]   Fax:  [phone]  Occupational Therapy Treatment  Patient Details  Name: [name] MRN: [id number] Date of Birth: 25-Dec-1931 Referring Provider: Cecilie Kicks, NP for Dr Daneen Schick at [facility] Cardiology  Encounter Date: 05/03/2016      OT End of Session - 05/03/16 1120    Visit Number 12   Number of Visits 36   Date for OT Re-Evaluation 06/14/16   OT Start Time 0920   OT Stop Time 1000   OT Time Calculation (min) 40 min   Activity Tolerance Patient tolerated treatment well;No increased pain;Other (comment)  retention of self care edu limited by decreased short term memory   Behavior During Therapy [facility] for tasks assessed/performed      Past Medical History  Diagnosis Date  . Obstructive chronic bronchitis without exacerbation ([place])   . Unspecified essential hypertension   . Unspecified venous (peripheral) insufficiency   . Edema 2005    "since put the glass in them"   . Other abnormal glucose   . Lumbago   . Unspecified nonpsychotic mental disorder   . Unspecified venous (peripheral) insufficiency   . Unspecified vitamin D deficiency   . Anemia, unspecified   . Anxiety   . Arthritis   . Asthma   . Systolic heart failure (HCC)     EF 35 to 40% per echo January 2015  . Depression   . GERD (gastroesophageal reflux disease)     Past Surgical History  Procedure Laterality Date  . Cholecystectomy    . Tonsillectomy    . Cervical spine surgery      x 2  . Partial hysterectomy    . Lumbar disc surgery    . Esophagogastroduodenoscopy N/A 04/05/2015    Procedure: ESOPHAGOGASTRODUODENOSCOPY (EGD);  Surgeon: [name], MD;  Location: [facility];  Service: Endoscopy;  Laterality: N/A;  . Flexible sigmoidoscopy N/A 04/05/2015    Procedure: FLEXIBLE SIGMOIDOSCOPY;  Surgeon: [name], MD;  Location: [facility];  Service:  Endoscopy;  Laterality: N/A;    There were no vitals filed for this visit.      Subjective Assessment - 05/03/16 1005    Subjective  Pt presents for OT visit 12 to address BLE lymphedema. Pt is accompanied by her nephew today. Pt is 20 minutes late for 60 minute appt and she is not wearing recommended compression wraps on either leg.    Patient is accompained by: Family member   Pertinent History Chronic bronchitis, CVI, anxiety , systoloc hert failure, asthma, arthritis, multiple spinal surgeries w/ chronic back pain   Limitations Difficulty walking, decreased standing tolerance, impaired transfer ability, decreased ankle AROM, Pain in BLE, Inability to fit street shoes w/ increased falls risk, increased infection risk.   Patient Stated Goals reduce swelling and legs feel better   Currently in Pain? Yes  not numerically rated   Pain Location Leg   Pain Orientation Right;Left   Pain Onset More than a month ago                      OT Treatments/Exercises (OP) - 05/03/16 0001    ADLs   ADL Education Given Yes   Manual Therapy   Edema Management skin care w/ lotion to LLE only 2/2 time constraints   Compression Bandaging LLE gradient compression wraps applied from toes to below knee: toe wrap x1 under  cotton stockinet; 8 cm x 5 m x 1 to foot and ankle, 10 cm x 5 m x 2, 12 cm x5 cm x 1, applied circumferentially in customary layered gradient configuration over .04 x 10 cm x 5 m Rosidal Soft x 1.5 .rolls.  foam to dorsum of patient's left foot to assist with decongestion of this area. as well as both sides of malleoli.                OT Education - 05/03/16 1118    Education provided No   Education Details Lengthy discussion throughout treatment with patient and nephew about the importance of meticulous skin care, proper compression and leg elevation when seated to facilitate ongoing progress towards goals and to limit infection risk. Reviewed Pt self care tregime  for visit interval for 4th time today. Pt able to teach back by end of session.   Person(s) Educated Patient;Caregiver(s)   Comprehension Verbalized understanding;Verbal cues required;Need further instruction             OT Long Term Goals - 05/01/16 1631    OT LONG TERM GOAL #1   Title Lymphedema (LE) self-care: Pt able to don/doff adjustable, alternative compression garments ( aka CircAif, Francia Greaves) between OT visits with Maximal caregiver assistance within 2 weeks to  facilitate improved prognosis and optimal volume reduction during Intensive CDT.   Baseline dependent   Time 2   Period Weeks   Status Achieved   OT LONG TERM GOAL #2   Title Pt to achieve at least 10% BLE limb volume reductions during Intensive CDT to limit LE progression, decrease infection risk, to reduce foot and leg pain, and to improve ability to fit lower body clothing and shoes.   Baseline dependent   Time 12   Period Weeks   Status Partially Met   OT LONG TERM GOAL #3   Title Pt >/= 85 % compliant with all daily, LE self-care protocols for home program, including simple self-manual lymphatic drainage (MLD), skin care, lymphatic pumping the ex, skin care, and donning/ doffing compression wraps and garments with needed level of caregiver assistance to limit LE progression and further functional decline.     Baseline dependent   Time 12   Period Weeks   Status Partially Met   OT LONG TERM GOAL #4   Title Lymphedema (LE) self-care : Pt >/= 85 % compliant with all daily LE self-care protocols, including simple self-manual lymphatic drainage (MLD), skin care, lymphatic pumping therex, and donning/ doffing progression garments with needed level of caregiver assistance to limit LE progression, infection risk and further functional decline.     Baseline dependent   Time 12   Period Weeks   Status Partially Met   OT LONG TERM GOAL #5   Title Pt to tolerate daily compression wraps, garments and devices in  keeping w/ prescribed wear regime within 1 week of issue date to progress and retain clinical and functional gains and to limit LE progression.   Baseline dependent   Time 12   Period Weeks   Status Partially Met               Plan - 05/03/16 1124    Clinical Impression Statement LLE swelling, tissue density and skin redness are maredly increased today. Pt reports she removed compression wraps at 2 AM last night and slept in bed with feet elevated. She does admit to sitting in her chair for several hours with feet in dependent position.  OT stressed importance of consistent LE sekf-care  between visits for optimal outcome of Intensive Phase CDT. Pt verbalized understanding that worsening condition of LLE today represents a  cllinical setback. Discussed importance of self care as essential to LE management over time as treatment will end when progress towards goals stops. Pt tolerated MLD without difficulty. Skin care and compression to LLE very gentle today due to increased fagility.   Rehab Potential Fair   Clinical Impairments Affecting Rehab Potential Pt presents with decreased prognosis 2/2 overall health status w/ multiple comorbidities and history of non-compliance with previous  lymphedema treatment.   OT Frequency 3x / week   OT Duration 12 weeks   OT Treatment/Interventions Self-care/ADL training;DME and/or AE instruction;Manual lymph drainage;Patient/family education;Compression bandaging;Therapeutic exercise;Therapeutic activities;Manual Therapy   Consulted and Agree with Plan of Care Patient;Family member/caregiver      Patient will benefit from skilled therapeutic intervention in order to improve the following deficits and impairments:  Decreased range of motion, Difficulty walking, Impaired flexibility, Increased edema, Decreased activity tolerance, Decreased knowledge of precautions, Decreased skin integrity, Decreased balance, Decreased knowledge of use of DME, Decreased scar  mobility, Pain, Decreased mobility  Visit Diagnosis: Lymphedema    Problem List Patient Active Problem List   Diagnosis Date Noted  . Headache 06/22/2015  . Anemia, iron deficiency 04/05/2015  . Rectal benign neoplasm   . Asthma, chronic 04/04/2015  . history of Tubular adenoma of colon 04/04/2015  . 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 Hypokalemia 04/04/2015  . CKD (chronic kidney disease) stage 3, GFR 30-59 ml/min 04/03/2015  . Dehydration 04/03/2015  . Skin ulcer ([place])   . History of colonic polyps 12/08/2014  . Edema 04/14/2014  . Chronic systolic heart failure ([place]) 11/20/2013  . Nausea with vomiting 11/13/2013  . Lymphedema of lower extremity 10/17/2013  . 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[place]) 04/04/2013  . MGUS (monoclonal gammopathy of unknown significance) 05/15/2012  . VITAMIN D DEFICIENCY 01/29/2010  . BURSITIS, LEFT ELBOW 01/29/2010  . DERMATITIS 09/30/2009  . DIABETES MELLITUS, BORDERLINE 01/11/2009  . Paranoid delusion ([place]) 08/02/2008  . OBSTRUCTIVE CHRONIC BRONCHITIS 08/02/2008  . Essential hypertension 11/11/2007  . Venous (peripheral) insufficiency 11/11/2007  . RENAL CALCULUS 11/11/2007  . LOW BACK PAIN, CHRONIC 11/11/2007    Andrey Spearman, MS, OTR/L, [facility] 05/03/2016 11:29 AM  [place] MAIN [facility] SERVICES [address] [place], [place], [address] Phone: [phone]   Fax:  [phone]  Name: [name] MRN: [id number] Date of Birth: September 25, 1932

## 2016-05-05 ENCOUNTER — Ambulatory Visit: Payer: Medicare Other | Admitting: Occupational Therapy

## 2016-05-05 DIAGNOSIS — I890 Lymphedema, not elsewhere classified: Secondary | ICD-10-CM | POA: Diagnosis not present

## 2016-05-05 NOTE — Patient Instructions (Signed)
LE instructions and precautions as established- see initial eval.   

## 2016-05-05 NOTE — Therapy (Signed)
[place] MAIN [facility] SERVICES [address] [place], [place], [address] Phone: [phone]   Fax:  [phone]  Occupational Therapy Treatment  Patient Details  Name: [name] MRN: [id number] Date of Birth: [birth year] Referring Provider: Cecilie Kicks, NP for Dr Daneen Schick at [facility] Cardiology  Encounter Date: 05/05/2016      OT End of Session - 05/05/16 1204    Visit Number 13   Number of Visits 36   Date for OT Re-Evaluation 06/14/16   OT Start Time 0923   OT Stop Time 1015   OT Time Calculation (min) 52 min   Activity Tolerance Patient tolerated treatment well;No increased pain;Other (comment)  retention of self care edu limited by decreased short term memory   Behavior During Therapy [facility] for tasks assessed/performed      Past Medical History  Diagnosis Date  . Obstructive chronic bronchitis without exacerbation ([place])   . Unspecified essential hypertension   . Unspecified venous (peripheral) insufficiency   . Edema 2005    "since put the glass in them"   . Other abnormal glucose   . Lumbago   . Unspecified nonpsychotic mental disorder   . Unspecified venous (peripheral) insufficiency   . Unspecified vitamin D deficiency   . Anemia, unspecified   . Anxiety   . Arthritis   . Asthma   . Systolic heart failure (HCC)     EF 35 to 40% per echo January 2015  . Depression   . GERD (gastroesophageal reflux disease)     Past Surgical History  Procedure Laterality Date  . Cholecystectomy    . Tonsillectomy    . Cervical spine surgery      x 2  . Partial hysterectomy    . Lumbar disc surgery    . Esophagogastroduodenoscopy N/A 04/05/2015    Procedure: ESOPHAGOGASTRODUODENOSCOPY (EGD);  Surgeon: [name], MD;  Location: [facility];  Service: Endoscopy;  Laterality: N/A;  . Flexible sigmoidoscopy N/A 04/05/2015    Procedure: FLEXIBLE SIGMOIDOSCOPY;  Surgeon: [name], MD;  Location: [facility];  Service:  Endoscopy;  Laterality: N/A;    There were no vitals filed for this visit.      Subjective Assessment - 05/05/16 0927    Subjective  Pt presents for OT visit 13 to address BLE lymphedema. Pt is accompanied by her nephew today. Pt is 25 minutes late for 60 minute appt.  and she is not wearing recommended compression wraps on either leg.    Patient is accompained by: Family member   Pertinent History Chronic bronchitis, CVI, anxiety , systoloc hert failure, asthma, arthritis, multiple spinal surgeries w/ chronic back pain   Limitations Difficulty walking, decreased standing tolerance, impaired transfer ability, decreased ankle AROM, Pain in BLE, Inability to fit street shoes w/ increased falls risk, increased infection risk.   Patient Stated Goals reduce swelling and legs feel better   Pain Onset More than a month ago                              OT Education - 05/05/16 1203    Education provided No   Education Details Continued skilled Pt/caregiver Education  And LE ADL training throughout visit for lymphedema self care, including compression wrapping, compression garment and device wear/care, lymphatic pumping ther ex, simple self-MLD, and skin care. Discussed progress towards goals.   Person(s) Educated Patient;Child(ren)   Methods Explanation   Comprehension  Verbalized understanding;Need further instruction             OT Long Term Goals - 05/01/16 1631    OT LONG TERM GOAL #1   Title Lymphedema (LE) self-care: Pt able to don/doff adjustable, alternative compression garments ( aka CircAif, Francia Greaves) between OT visits with Maximal caregiver assistance within 2 weeks to  facilitate improved prognosis and optimal volume reduction during Intensive CDT.   Baseline dependent   Time 2   Period Weeks   Status Achieved   OT LONG TERM GOAL #2   Title Pt to achieve at least 10% BLE limb volume reductions during Intensive CDT to limit LE progression, decrease  infection risk, to reduce foot and leg pain, and to improve ability to fit lower body clothing and shoes.   Baseline dependent   Time 12   Period Weeks   Status Partially Met   OT LONG TERM GOAL #3   Title Pt >/= 85 % compliant with all daily, LE self-care protocols for home program, including simple self-manual lymphatic drainage (MLD), skin care, lymphatic pumping the ex, skin care, and donning/ doffing compression wraps and garments with needed level of caregiver assistance to limit LE progression and further functional decline.     Baseline dependent   Time 12   Period Weeks   Status Partially Met   OT LONG TERM GOAL #4   Title Lymphedema (LE) self-care : Pt >/= 85 % compliant with all daily LE self-care protocols, including simple self-manual lymphatic drainage (MLD), skin care, lymphatic pumping therex, and donning/ doffing progression garments with needed level of caregiver assistance to limit LE progression, infection risk and further functional decline.     Baseline dependent   Time 12   Period Weeks   Status Partially Met   OT LONG TERM GOAL #5   Title Pt to tolerate daily compression wraps, garments and devices in keeping w/ prescribed wear regime within 1 week of issue date to progress and retain clinical and functional gains and to limit LE progression.   Baseline dependent   Time 12   Period Weeks   Status Partially Met               Plan - 05/05/16 1204    Clinical Impression Statement Pt reports her back was hurting to badly to don compression yesterday. Compliance with /compression on visit interval days is minimal and Pt has no assistance at home. Swelling is increased again today. Skin is reddened  and fragile from  distal leg to toes , but well hydrated. Vendor confirmed that Pt may have insurance benefits for  compression garments and devices. Faxed demographics and LMN to vendor for recommended  garments and devices. Will complete measurements next visit.    Rehab Potential Fair   Clinical Impairments Affecting Rehab Potential Pt presents with decreased prognosis 2/2 overall health status w/ multiple comorbidities and history of non-compliance with previous  lymphedema treatment.   OT Frequency 3x / week   OT Duration 12 weeks   OT Treatment/Interventions Self-care/ADL training;DME and/or AE instruction;Manual lymph drainage;Patient/family education;Compression bandaging;Therapeutic exercise;Therapeutic activities;Manual Therapy   Consulted and Agree with Plan of Care Patient;Family member/caregiver      Patient will benefit from skilled therapeutic intervention in order to improve the following deficits and impairments:  Decreased range of motion, Difficulty walking, Impaired flexibility, Increased edema, Decreased activity tolerance, Decreased knowledge of precautions, Decreased skin integrity, Decreased balance, Decreased knowledge of use of DME, Decreased scar mobility, Pain,  Decreased mobility  Visit Diagnosis: Lymphedema    Problem List Patient Active Problem List   Diagnosis Date Noted  . Headache 06/22/2015  . Anemia, iron deficiency 04/05/2015  . Rectal benign neoplasm   . Asthma, chronic 04/04/2015  . history of Tubular adenoma of colon 04/04/2015  . 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 Hypokalemia 04/04/2015  . CKD (chronic kidney disease) stage 3, GFR 30-59 ml/min 04/03/2015  . Dehydration 04/03/2015  . Skin ulcer ([place])   . History of colonic polyps 12/08/2014  . Edema 04/14/2014  . Chronic systolic heart failure ([place]) 11/20/2013  . Nausea with vomiting 11/13/2013  . Lymphedema of lower extremity 10/17/2013  . 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 MGUS (monoclonal gammopathy of unknown significance) 05/15/2012  . VITAMIN D DEFICIENCY 01/29/2010  . BURSITIS, LEFT ELBOW 01/29/2010  . DERMATITIS 09/30/2009  . DIABETES MELLITUS, BORDERLINE 01/11/2009  . Paranoid delusion ([place]) 08/02/2008  . OBSTRUCTIVE CHRONIC  BRONCHITIS 08/02/2008  . Essential hypertension 11/11/2007  . Venous (peripheral) insufficiency 11/11/2007  . RENAL CALCULUS 11/11/2007  . LOW BACK PAIN, CHRONIC 11/11/2007    Andrey Spearman, MS, OTR/L, [facility] 05/05/2016 12:13 PM  [place] MAIN [facility] SERVICES [address] [place], [place], [address] Phone: [phone]   Fax:  [phone]  Name: [name] MRN: [id number] Date of Birth: 1932-10-21

## 2016-05-08 ENCOUNTER — Encounter: Payer: Self-pay | Admitting: Occupational Therapy

## 2016-05-08 ENCOUNTER — Ambulatory Visit: Payer: Medicare Other | Admitting: Occupational Therapy

## 2016-05-08 DIAGNOSIS — I890 Lymphedema, not elsewhere classified: Secondary | ICD-10-CM

## 2016-05-08 NOTE — Therapy (Signed)
Created in error. Routed OT note to referring MD.

## 2016-05-08 NOTE — Patient Instructions (Signed)
LE instructions and precautions as established- see initial eval.   

## 2016-05-08 NOTE — Therapy (Signed)
[place] MAIN [facility] SERVICES [address] [place], [place], [address] Phone: [phone]   Fax:  [phone]  Occupational Therapy Treatment & Progress Report  Patient Details  Name: [name] MRN: [id number] Date of Birth: [birth year] Referring Provider: Cecilie Kicks, NP for Dr Daneen Schick at [facility] Cardiology  Encounter Date: 05/08/2016      OT End of Session - 05/08/16 1528    Visit Number 14   Number of Visits 36   Date for OT Re-Evaluation 06/14/16   OT Start Time 0930   OT Stop Time 1010   OT Time Calculation (min) 40 min   Activity Tolerance Patient tolerated treatment well;No increased pain;Other (comment)  retention of self care edu limited by decreased short term memory   Behavior During Therapy [facility] for tasks assessed/performed      Past Medical History  Diagnosis Date  . Obstructive chronic bronchitis without exacerbation ([place])   . Unspecified essential hypertension   . Unspecified venous (peripheral) insufficiency   . Edema 2005    "since put the glass in them"   . Other abnormal glucose   . Lumbago   . Unspecified nonpsychotic mental disorder   . Unspecified venous (peripheral) insufficiency   . Unspecified vitamin D deficiency   . Anemia, unspecified   . Anxiety   . Arthritis   . Asthma   . Systolic heart failure (HCC)     EF 35 to 40% per echo January 2015  . Depression   . GERD (gastroesophageal reflux disease)     Past Surgical History  Procedure Laterality Date  . Cholecystectomy    . Tonsillectomy    . Cervical spine surgery      x 2  . Partial hysterectomy    . Lumbar disc surgery    . Esophagogastroduodenoscopy N/A 04/05/2015    Procedure: ESOPHAGOGASTRODUODENOSCOPY (EGD);  Surgeon: [name], MD;  Location: [facility];  Service: Endoscopy;  Laterality: N/A;  . Flexible sigmoidoscopy N/A 04/05/2015    Procedure: FLEXIBLE SIGMOIDOSCOPY;  Surgeon: [name], MD;  Location: [facility];  Service: Endoscopy;  Laterality: N/A;    There were no vitals filed for this visit.      Subjective Assessment - 05/08/16 1513    Subjective  Pt presents for OT visit 14 to address BLE lymphedema. Pt is accompanied by her nephew today. Pt is 40 minutes late for 60 minute appt  and she is not wearing recommended compression wraps. Pt, nephew and OT had lengthy, frank, discussion about importance of utilizing compression daily between appointments in order to show progress tpwards goals. Pt and nephew verbalized need to show progress towards rehab goals in order to  meet Medicare guidelines regarding medical necessity w/ progress towards goals to continue treatment.     Patient is accompained by: Family member   Pertinent History Chronic bronchitis, CVI, anxiety , systoloc hert failure, asthma, arthritis, multiple spinal surgeries w/ chronic back pain   Limitations Difficulty walking, decreased standing tolerance, impaired transfer ability, decreased ankle AROM, Pain in BLE, Inability to fit street shoes w/ increased falls risk, increased infection risk.   Patient Stated Goals reduce swelling and legs feel better   Currently in Pain? Yes  not rated numerically. B lehs and neck   Pain Onset More than a month ago                      OT Treatments/Exercises (OP) - 05/08/16  0001    ADLs   ADL Education Given Yes   Manual Therapy   Manual Therapy Edema management;Compression Bandaging;Manual Lymphatic Drainage (MLD)   Edema Management very brief skin care 2/2 time constraints   Manual Lymphatic Drainage (MLD) no MLD 2/2 time constraints   Compression Bandaging LLE gradient compression wraps applied from toes to below knee: toe wrap x1 under cotton stockinet; 8 cm x 5 m x 1 to foot and ankle, 10 cm x 5 m x 2, 12 cm x5 cm x 1, applied circumferentially in customary layered gradient configuration over .04 x 10 cm x 5 m Rosidal Soft x 1.5 .rolls.  foam to dorsum of patient's  left foot to assist with decongestion of this area. as well as both sides of malleoli.                OT Education - 05/08/16 1518    Education provided Yes   Education Details Need for medical neccessity and progress towards OT rehab goals to [facility] Medicare guidelines  for skilled OT   Person(s) Educated Patient;Child(ren)   Methods Explanation;Demonstration;Verbal cues;Tactile cues;Handout;Other (comment)  OT, Pt and nephew have had this  discussion 5 x or more; however, despite verbalizing understanding of importance of diligent compliance w LE self care program, and need to demonstrate progress towards goalls, Pt has not made progress in these areas..   Comprehension Verbalized understanding;Verbal cues required             OT Long Term Goals - 05/01/16 1631    OT LONG TERM GOAL #1   Title Lymphedema (LE) self-care: Pt able to don/doff adjustable, alternative compression garments ( aka CircAif, Francia Greaves) between OT visits with Maximal caregiver assistance within 2 weeks to  facilitate improved prognosis and optimal volume reduction during Intensive CDT.   Baseline dependent   Time 2   Period Weeks   Status Achieved   OT LONG TERM GOAL #2   Title Pt to achieve at least 10% BLE limb volume reductions during Intensive CDT to limit LE progression, decrease infection risk, to reduce foot and leg pain, and to improve ability to fit lower body clothing and shoes.   Baseline dependent   Time 12   Period Weeks   Status Partially Met   OT LONG TERM GOAL #3   Title Pt >/= 85 % compliant with all daily, LE self-care protocols for home program, including simple self-manual lymphatic drainage (MLD), skin care, lymphatic pumping the ex, skin care, and donning/ doffing compression wraps and garments with needed level of caregiver assistance to limit LE progression and further functional decline.     Baseline dependent   Time 12   Period Weeks   Status Partially Met   OT LONG TERM  GOAL #4   Title Lymphedema (LE) self-care : Pt >/= 85 % compliant with all daily LE self-care protocols, including simple self-manual lymphatic drainage (MLD), skin care, lymphatic pumping therex, and donning/ doffing progression garments with needed level of caregiver assistance to limit LE progression, infection risk and further functional decline.     Baseline dependent   Time 12   Period Weeks   Status Partially Met   OT LONG TERM GOAL #5   Title Pt to tolerate daily compression wraps, garments and devices in keeping w/ prescribed wear regime within 1 week of issue date to progress and retain clinical and functional gains and to limit LE progression.   Baseline dependent   Time 12  Period Weeks   Status Partially Met               Plan - 05/08/16 1529    Clinical Impression Statement Pt arrives 30 minutes late for 60 minute session again today. Chronic tardiness limits Pt's access to full CDT treatment regime 2/2 time constraints, thus lsignificantly imiting clinical outcomes as well . Pt's inability to perform LE self care protocols during visit intervals presents a  significant barrier to progress towards rehab goals at present. For past week or so she has presented for OT without compression wraps with marked increase in swelling and decreasing skin condition. Despite repeated discussions regarding the improtance of LE self care, specifically diligent hygine and skin care coupled w/ appropriate compression, compliance has declined and skin condition and swelling is worsening . At present skin at distal L distal leg and foot are veryprone to breakdown and interstitial pressure is putting excessive outward pressure on new , fragile skin. Without improved compliance in LE self care Pt prognosis is poor. Pt and nephew encouraged to obtain assistance with visit interval self care home program ASAP. Referring MD  notified via progress report today as  declining skin condition presents   serious concern  coupled with DM history.   Rehab Potential Fair   Clinical Impairments Affecting Rehab Potential Pt presents with decreased prognosis 2/2 overall health status w/ multiple comorbidities and history of non-compliance with previous  lymphedema treatment.   OT Frequency 3x / week   OT Duration 12 weeks   OT Treatment/Interventions Self-care/ADL training;DME and/or AE instruction;Manual lymph drainage;Patient/family education;Compression bandaging;Therapeutic exercise;Therapeutic activities;Manual Therapy   Consulted and Agree with Plan of Care Patient;Family member/caregiver      Patient will benefit from skilled therapeutic intervention in order to improve the following deficits and impairments:  Decreased range of motion, Difficulty walking, Impaired flexibility, Increased edema, Decreased activity tolerance, Decreased skin integrity, Decreased balance, Decreased knowledge of use of DME, Decreased scar mobility, Pain, Decreased mobility, Decreased knowledge of precautions  Visit Diagnosis: Lymphedema    Problem List Patient Active Problem List   Diagnosis Date Noted  . Headache 06/22/2015  . Anemia, iron deficiency 04/05/2015  . Rectal benign neoplasm   . Asthma, chronic 04/04/2015  . history of Tubular adenoma of colon 04/04/2015  . 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 Hypokalemia 04/04/2015  . CKD (chronic kidney disease) stage 3, GFR 30-59 ml/min 04/03/2015  . Dehydration 04/03/2015  . Skin ulcer ([place])   . History of colonic polyps 12/08/2014  . Edema 04/14/2014  . Chronic systolic heart failure ([place]) 11/20/2013  . Nausea with vomiting 11/13/2013  . Lymphedema of lower extremity 10/17/2013  . 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 MGUS (monoclonal gammopathy of unknown significance) 05/15/2012  . VITAMIN D DEFICIENCY 01/29/2010  . BURSITIS, LEFT ELBOW 01/29/2010  . DERMATITIS 09/30/2009  . DIABETES MELLITUS, BORDERLINE 01/11/2009  . Paranoid  delusion ([place]) 08/02/2008  . OBSTRUCTIVE CHRONIC BRONCHITIS 08/02/2008  . Essential hypertension 11/11/2007  . Venous (peripheral) insufficiency 11/11/2007  . RENAL CALCULUS 11/11/2007  . LOW BACK PAIN, CHRONIC 11/11/2007    Andrey Spearman, MS, OTR/L, [facility] 05/08/2016 3:41 PM  [place] MAIN [facility] SERVICES [address] [place], [place], [address] Phone: [phone]   Fax:  [phone]  Name: [name] MRN: [id number] Date of Birth: June 30, 1932

## 2016-05-09 ENCOUNTER — Telehealth: Payer: Self-pay | Admitting: *Deleted

## 2016-05-09 NOTE — Telephone Encounter (Signed)
Called pt, per Cecilie Kicks, NP to advise her that we received a call from her OT, Andrey Spearman, and that she was concerned with pt dx: DM and having ulcers on her legs. Pt has been advised to contact her PCP to get a sooner appt. Pt verbalized understanding and appreciation.

## 2016-05-10 ENCOUNTER — Ambulatory Visit: Payer: Medicare Other | Admitting: Occupational Therapy

## 2016-05-12 ENCOUNTER — Ambulatory Visit: Payer: Medicare Other | Admitting: Occupational Therapy

## 2016-05-15 ENCOUNTER — Ambulatory Visit: Payer: Medicare Other | Admitting: Occupational Therapy

## 2016-05-17 ENCOUNTER — Ambulatory Visit: Payer: Medicare Other | Admitting: Occupational Therapy

## 2016-05-17 NOTE — Progress Notes (Signed)
No show

## 2016-05-18 ENCOUNTER — Ambulatory Visit (INDEPENDENT_AMBULATORY_CARE_PROVIDER_SITE_OTHER): Payer: Self-pay | Admitting: Interventional Cardiology

## 2016-05-18 DIAGNOSIS — N183 Chronic kidney disease, stage 3 (moderate): Secondary | ICD-10-CM

## 2016-05-18 DIAGNOSIS — I10 Essential (primary) hypertension: Secondary | ICD-10-CM

## 2016-05-18 DIAGNOSIS — I872 Venous insufficiency (chronic) (peripheral): Secondary | ICD-10-CM

## 2016-05-18 DIAGNOSIS — I5022 Chronic systolic (congestive) heart failure: Secondary | ICD-10-CM

## 2016-05-19 ENCOUNTER — Ambulatory Visit: Payer: Medicare Other | Admitting: Occupational Therapy

## 2016-05-19 ENCOUNTER — Encounter: Payer: Self-pay | Admitting: Interventional Cardiology

## 2016-05-19 ENCOUNTER — Telehealth: Payer: Self-pay | Admitting: Occupational Therapy

## 2016-05-22 ENCOUNTER — Ambulatory Visit: Payer: Medicare Other | Admitting: Occupational Therapy

## 2016-05-24 ENCOUNTER — Ambulatory Visit: Payer: Medicare Other | Admitting: Occupational Therapy

## 2016-05-26 ENCOUNTER — Ambulatory Visit: Payer: Medicare Other | Admitting: Occupational Therapy

## 2016-05-29 ENCOUNTER — Ambulatory Visit: Payer: Medicare Other | Admitting: Occupational Therapy

## 2016-05-31 ENCOUNTER — Ambulatory Visit: Payer: Medicare Other | Admitting: Occupational Therapy

## 2016-05-31 ENCOUNTER — Ambulatory Visit: Payer: Medicare Other | Admitting: Internal Medicine

## 2016-06-02 ENCOUNTER — Ambulatory Visit: Payer: Medicare Other | Admitting: Occupational Therapy

## 2016-06-05 ENCOUNTER — Ambulatory Visit: Payer: Medicare Other | Admitting: Occupational Therapy

## 2016-06-07 ENCOUNTER — Encounter: Payer: Self-pay | Admitting: Cardiology

## 2016-06-07 ENCOUNTER — Ambulatory Visit (INDEPENDENT_AMBULATORY_CARE_PROVIDER_SITE_OTHER): Payer: Medicare Other | Admitting: Cardiology

## 2016-06-07 ENCOUNTER — Ambulatory Visit: Payer: Medicare Other | Admitting: Occupational Therapy

## 2016-06-07 VITALS — BP 140/80 | HR 86 | Ht 62.0 in | Wt 150.8 lb

## 2016-06-07 DIAGNOSIS — I5022 Chronic systolic (congestive) heart failure: Secondary | ICD-10-CM | POA: Diagnosis not present

## 2016-06-07 DIAGNOSIS — R609 Edema, unspecified: Secondary | ICD-10-CM | POA: Diagnosis not present

## 2016-06-07 DIAGNOSIS — I890 Lymphedema, not elsewhere classified: Secondary | ICD-10-CM

## 2016-06-07 DIAGNOSIS — N183 Chronic kidney disease, stage 3 (moderate): Secondary | ICD-10-CM | POA: Diagnosis not present
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
## 2016-06-07 NOTE — Patient Instructions (Signed)
Medication Instructions: Your physician recommends that you continue on your current medications as directed. Please refer to the Current Medication list given to you today.    Labwork: TODAY : BMET   Procedures/Testing: NONE  Follow-Up: Your physician wants you to follow-up in 6 months with Dr. [name]. You will receive a reminder letter in the mail two months in advance. If you don't receive a letter, please call our office to schedule the follow-up appointment.   Any Additional Special Instructions Will Be Listed Below (If Applicable).     If you need a refill on your cardiac medications before your next appointment, please call your pharmacy.

## 2016-06-08 LAB — BASIC METABOLIC PANEL
BUN: 19 mg/dL (ref 7–25)
CO2: 24 mmol/L (ref 20–31)
Calcium: 10 mg/dL (ref 8.6–10.4)
Chloride: 103 mmol/L (ref 98–110)
Creat: 1.25 mg/dL — ABNORMAL HIGH (ref 0.60–0.88)
GLUCOSE: 83 mg/dL (ref 65–99)
POTASSIUM: 3.7 mmol/L (ref 3.5–5.3)
SODIUM: 140 mmol/L (ref 135–146)

## 2016-06-09 ENCOUNTER — Ambulatory Visit: Payer: Medicare Other | Admitting: Occupational Therapy

## 2016-06-12 ENCOUNTER — Ambulatory Visit: Payer: Medicare Other | Admitting: Occupational Therapy

## 2016-06-14 ENCOUNTER — Telehealth: Payer: Self-pay | Admitting: Cardiology

## 2016-06-14 ENCOUNTER — Ambulatory Visit: Payer: Medicare Other | Admitting: Occupational Therapy

## 2016-06-14 NOTE — Telephone Encounter (Signed)
-----   Message from Isaiah Serge, NP sent at 06/08/2016  8:01 AM EDT ----- Labs good

## 2016-06-14 NOTE — Telephone Encounter (Signed)
New message    Patient calling back to speak with nurse from today.

## 2016-06-14 NOTE — Telephone Encounter (Signed)
Returned pts call and she has been made aware of her lab results and she verbalized understanding. 

## 2016-06-15 ENCOUNTER — Ambulatory Visit: Payer: Medicare Other | Admitting: Neurology

## 2016-06-16 ENCOUNTER — Ambulatory Visit: Payer: Medicare Other | Admitting: Occupational Therapy

## 2016-06-19 ENCOUNTER — Ambulatory Visit: Payer: Medicare Other | Admitting: Occupational Therapy

## 2016-06-21 ENCOUNTER — Ambulatory Visit: Payer: Medicare Other | Admitting: Occupational Therapy

## 2016-06-23 ENCOUNTER — Ambulatory Visit: Payer: Medicare Other | Admitting: Occupational Therapy

## 2016-06-23 ENCOUNTER — Ambulatory Visit: Payer: Medicare Other | Admitting: Internal Medicine

## 2016-06-30 NOTE — Telephone Encounter (Signed)
Erroneous

## 2016-07-10 ENCOUNTER — Other Ambulatory Visit: Payer: Self-pay | Admitting: Cardiology

## 2016-07-10 DIAGNOSIS — R609 Edema, unspecified: Secondary | ICD-10-CM | POA: Diagnosis not present

## 2016-07-10 DIAGNOSIS — N183 Chronic kidney disease, stage 3 (moderate): Secondary | ICD-10-CM | POA: Diagnosis not present

## 2016-07-10 LAB — BASIC METABOLIC PANEL
BUN: 29 mg/dL — AB (ref 7–25)
CO2: 30 mmol/L (ref 20–31)
Calcium: 9.8 mg/dL (ref 8.6–10.4)
Chloride: 101 mmol/L (ref 98–110)
Creat: 1.44 mg/dL — ABNORMAL HIGH (ref 0.60–0.88)
Glucose, Bld: 80 mg/dL (ref 65–99)
POTASSIUM: 3.7 mmol/L (ref 3.5–5.3)
SODIUM: 137 mmol/L (ref 135–146)

## 2016-08-01 ENCOUNTER — Other Ambulatory Visit: Payer: Self-pay | Admitting: Pulmonary Disease

## 2016-08-07 NOTE — Progress Notes (Signed)
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 continue same... ~  6/14:  Labs on VitD50K weekly showed VitD= 53 ~  6/15:  Labs showed Vit D level = 77 on 50K weekly...   HEME FOLLOWED by DrShadad >>   ANEMIA & MGUS >> on FeSO4 one daily w/ VitC... ~  labs 3/08 showed Hg= 12.1, MCV= 84 ~  labs 9/09 showed Hg= 11.5, MCV= 85 ~  labs 4/11 showed Hg= 11.2, MCV= 85, Fe= 37 (12%sat), B12 >1500, Folate= 17... rec> FeSO4 + VitC daily. ~  Labs 7/12 showed Hg= 10.9, MCV= 84, Fe= 33 (10%sat); SPE w/ IgG kappa monoclonal gammopathy- accounts for 1.12g/dL of the total 3.10 g/dL of protein in the gamma region (IgG & IgA are elev, and IgM is low norm)... ~  Labs 7/13 showed Hg= 10.4, Fe= 60 (19%sat);  SPE/IEP did NOT show a monoclonal paraprotein this time; but IgG & IgA levels were further elevated over last yrs numbers; we will check 24H urine for TProt/ creat clearance/ IEP==> ADDENDUM>> 24H Urine 7/13 showed polyclonal incr free kappa light chains, no monoclonal Bence-Jones protein, Total prot excretion=  '171mg'$ /day, Cr clearance=54 ml/min. ~  Labs 6/14 showed Hg= 8.7, MCV= 80;  SPE/IEP did NOT show an M-spike, but IgG & IgA were further increased over the prev yrs & she is referred to Hematology for their consult=> she cancelled & never resched... ~  12/14: she has her niece here now to assume her care; we will resched the HEME appt... ~  She saw DrShadad for Heme 1/15 (note reviewed)>  Anemia & abn SPE/ IEP with steadily increasing IgG & IgA levels but no clear monoclonal peak; he has rec oral iron therapy & observation for now; last Hg was 1/15 = 10.6, up from 8.8.[name]. ~  Labs 6/15 showed Hg= 10.6 on FeSO4 bid; she continues to f/u w/ DrShadad... ~  Labs 4/16 showed Hg=9.4, B12>1500 and she needs f/u DrShadad...   Past Surgical History:  Procedure Laterality Date  .  APPENDECTOMY    . CERVICAL SPINE SURGERY     x 2  . CHOLECYSTECTOMY    . ESOPHAGOGASTRODUODENOSCOPY N/A 04/05/2015   Procedure: ESOPHAGOGASTRODUODENOSCOPY (EGD);  Surgeon: [name], MD;  Location: [facility];  Service: Endoscopy;  Laterality: N/A;  . FLEXIBLE SIGMOIDOSCOPY N/A 04/05/2015   Procedure: FLEXIBLE SIGMOIDOSCOPY;  Surgeon: [name], MD;  Location: [facility];  Service: Endoscopy;  Laterality: N/A;  . HAND SURGERY    . LUMBAR DISC SURGERY    . PARTIAL HYSTERECTOMY    . TONSILLECTOMY      Outpatient Encounter Prescriptions as of 08/13/2015  Medication Sig  . albuterol (PROAIR HFA) 108 (90 BASE) MCG/ACT inhaler INHALE 2 PUFFS EVERY 4 HOURS AS NEEDED FOR WHEEZING  . ALPRAZolam (XANAX) 0.5 MG tablet TAKE 1/2 TO 1 TABLET BY MOUTH THREE TIMES DAILY AS NEEDED FOR NERVES  . ascorbic acid (VITAMIN C) 500 MG tablet Take 500 mg by mouth daily.  . carvedilol (COREG) 6.25 MG tablet TAKE 1 TABLET BY MOUTH TWICE DAILY  . clotrimazole-betamethasone (LOTRISONE) cream Apply 1 application topically 2 (two) times daily. To her feet  . Ferrous Sulfate Dried 200 (65 FE) MG TABS Take 1 tablet by mouth 2 (two) times daily.   . hydrOXYzine (ATARAX/VISTARIL) 25 MG tablet Take 1 tablet (25 mg total) by mouth every 4 (four) hours as needed. For itching  . lisinopril (PRINIVIL,ZESTRIL) 20 MG tablet Take 1 tablet (20 mg total) by mouth daily.  . ondansetron (ZOFRAN) 4 MG tablet TAKE ONE TABLET BY MOUTH THREE TIMES DAILY AS NEEDED FOR NAUSEA  . potassium chloride (KLOR-CON) 20 MEQ packet Take 20 mEq by mouth 3 (three) times daily.  . silver sulfADIAZINE (SILVADENE) 1 % cream Apply daily as directed  . theophylline (THEO-24) 400 MG 24 hr capsule Take 1 capsule (400 mg total) by mouth daily.  [name] torsemide (DEMADEX) 20 MG tablet Take 3 tablets (60 mg total) by mouth 2 (two) times daily.  . traMADol (ULTRAM) 50 MG tablet Take 1 tablet (50 mg total) by mouth 3 (three) times daily.  [name] triamcinolone cream (KENALOG) 0.1 % Apply 1 application topically 2 (two) times daily.  . Vitamin D, Ergocalciferol, (DRISDOL) 50000 UNITS CAPS capsule TAKE 1 CAPSULE BY MOUTH EVERY 7 DAYS ON SUNDAY OR MONDAY    Allergies  Allergen Reactions  . Aspirin Other (See Comments)    REACTION: upset stomach  . Codeine Other (See Comments)    REACTION: "MAKES MY BODY GO CRAZY; CRAMPS"  . Latex Nausea And Vomiting    Current Medications, Allergies, Past Medical History, Past Surgical History, Family History, and Social History were reviewed in [employer] record.    Review of Systems        See HPI - all other systems neg except as noted...   The patient complains of dyspnea on exertion, peripheral edema, and difficulty walking.  The patient denies anorexia, fever, weight loss, weight gain, vision loss, decreased hearing, hoarseness, chest pain, syncope, prolonged cough, headaches, hemoptysis, abdominal pain, melena, hematochezia, severe indigestion/heartburn, hematuria, incontinence, muscle weakness, suspicious skin lesions, transient blindness, depression, unusual weight change, abnormal bleeding, enlarged lymph nodes, and  angioedema.     Objective:   Physical Exam     WD, Overweight, 82 y/o BF in NAD... GENERAL:  Alert & oriented x 3... she has obvious paranoid delusions... HEENT:  [place]/AT, EOM-wnl, PERRLA, EACs-clear, TMs-wnl, NOSE-clear, THROAT-clear & wnl. NECK:  Supple w/ fairROM; no JVD; normal carotid impulses w/o  bruits; no thyromegaly or nodules palpated; no lymphadenopathy. CHEST:  Clear to P & A; without wheezes/ rales/ or rhonchi heard.[name] HEART:  Regular Rhythm; without murmurs/ rubs/ or gallops detected... ABDOMEN:  Soft & nontender; normal bowel sounds; no organomegaly or masses palpated... EXT:  mod arthritic changes; +varicose veins/ +venous insuffic/ 4+edema in left foot w/ severe stasis dermatitis... NEURO:  CN's intact;  no focal neuro deficits... DERM:  chr lymphedema & dermatitis in skin of LE's, w/ some weeping...  RADIOLOGY DATA:  Reviewed in the EPIC EMR & discussed w/ the patient...  LABORATORY DATA:  Reviewed in the EPIC EMR & discussed w/ the patient...   Assessment & Plan:    COPD, Chr Bronchitis>  She was on Qvar, Proventil, & Theodur and she changed to ADVAIR 100Bid for awhile & we tried to stop the Theodur;  It is always unclear what she is taking as she never brings bottles or list to the office follow up visits, but she stopped the Advair & prefers the Theodur200Bid stating that this helps the most; has rescue inhaler for prn use... 08/13/15>  We decided to add ADVAIR-HFA 115/21 at 2spBid & gave her the 2016 FLU vaccine today... 02/08/16>  Stable, edema improved, reminded to bring all meds to every OV...    HBP>  Controlled on Coreg, Lisinopril & Demedex per DrHSmith; instructed on 2gm Na diet...  Ven Insuffic & Lymphedema LEs>  We reviewed the need to for 2gm sodium diet, elev legs, support hose vs ACE wraps, continue the diuretic rx; she never went to the wound care clinic in 2012; she went to the new regional Lymphedema clinic in [place] x1 only & needs to return, we ordered  [facility] visiting nurses & topical Rx w/ mild soapy water, pat dry, cover w/ silvadene (she didn't let them in to help); now niece is in charge & requests referral to [facility]=> we requested appt- ?if she ever went... She was supposed to see rehab therapy in [place] w/ [facility] sched per DrHSmith but we don't have notes...  Vit D Defic>  Her Vit D level is improved at 77, continue 50K weekly...  ANEMIA> She has a ?MGUS w/ IgG kappa paraprotein ident in 2012 but f/u labs in 2013-4 did not show monoclonal spike although both IgG & IgA levels are further elevated... She finally saw Heme- DrShadad & his notes are reviewed; he has her on oral iron therapy & observation...  Psychiatric Problem>  She persists w/ paranoid ideations revolving around worker's compensation disability;  She has repeatedly refused to go to mental health or allow me to refer to a psychiatrist;  She was seen in the ER on an Involuntary commitment petition 9/13 (by her son) but the psychiatrist rescinded the petition & sent her home...  She continues to refuse any psychiatric help...   Patient's Medications  New Prescriptions   GABAPENTIN (NEURONTIN) 100 MG CAPSULE    Take 2 capsules (200 mg total) by mouth at bedtime.  Previous Medications   ALBUTEROL (PROAIR HFA) 108 (90 BASE) MCG/ACT INHALER    INHALE 2 PUFFS EVERY 4 HOURS AS NEEDED FOR WHEEZING   ASCORBIC ACID (VITAMIN C) 500 MG TABLET    Take 1,000 mg by mouth daily.    CARVEDILOL (COREG) 6.25 MG TABLET    TAKE 1 TABLET BY MOUTH TWICE DAILY   CLOTRIMAZOLE-BETAMETHASONE (LOTRISONE) CREAM    Apply 1 application topically 2 (two) times daily. To her feet   FERROUS SULFATE DRIED 200 (65 FE)  MG TABS    Take 1 tablet by mouth 2 (two) times daily.   LISINOPRIL (PRINIVIL,ZESTRIL) 20 MG TABLET    Take 20 mg by mouth daily.   POTASSIUM CHLORIDE SA (K-DUR,KLOR-CON) 20 MEQ TABLET    Take 2 tablets (40 mEq total) by mouth once.   SILVER SULFADIAZINE (SILVADENE) 1 % CREAM    Apply 1 application  topically daily as needed (for foot irritation).   TORSEMIDE (DEMADEX) 20 MG TABLET    Take 3 tablets (60 mg total) by mouth 2 (two) times daily.   TRIAMCINOLONE CREAM (KENALOG) 0.1 %    Apply 1 application topically 2 (two) times daily.  Modified Medications   Modified Medication Previous Medication   ADVAIR HFA 115-21 MCG/ACT INHALER fluticasone-salmeterol (ADVAIR HFA) 115-21 MCG/ACT inhaler      USE 2 INHALATIONS TWICE A DAY    Inhale 2 puffs into the lungs 2 (two) times daily.   THEO-24 400 MG 24 HR CAPSULE theophylline (THEO-24) 400 MG 24 hr capsule      TAKE 1 CAPSULE DAILY    Take 400 mg by mouth daily.  Discontinued Medications   ALPRAZOLAM (XANAX) 0.5 MG TABLET    Take 1/2 to 1 tablet by mouth 3 times a day as needed for nerves.   ALPRAZOLAM (XANAX) 0.5 MG TABLET    Take 1/2 to 1 tablet by mouth 3 times a day as needed for nerves.   GABAPENTIN (NEURONTIN) 100 MG CAPSULE    Take 1 capsule (100 mg total) by mouth at bedtime.   HYDRALAZINE (APRESOLINE) 25 MG TABLET    Take 1 tablet (25 mg total) by mouth 2 (two) times daily.   HYDROXYZINE (ATARAX/VISTARIL) 25 MG TABLET    Take 1 tablet (25 mg total) by mouth every 4 (four) hours as needed. For itching   ONDANSETRON (ZOFRAN) 4 MG TABLET    TAKE ONE TABLET BY MOUTH THREE TIMES DAILY AS NEEDED FOR NAUSEA   TRAMADOL (ULTRAM) 50 MG TABLET    TAKE 1 TABLET BY MOUTH THREE TIMES DAILY   VITAMIN D, ERGOCALCIFEROL, (DRISDOL) 50000 UNITS CAPS CAPSULE    TAKE 1 CAPSULE BY MOUTH EVERY 7 DAYS ON SUNDAY OR MONDAY

## 2016-08-08 ENCOUNTER — Ambulatory Visit: Payer: Medicare Other | Admitting: Pulmonary Disease

## 2016-08-10 ENCOUNTER — Ambulatory Visit (INDEPENDENT_AMBULATORY_CARE_PROVIDER_SITE_OTHER): Payer: Medicare Other | Admitting: Pulmonary Disease

## 2016-08-10 ENCOUNTER — Encounter: Payer: Self-pay | Admitting: Pulmonary Disease

## 2016-08-10 VITALS — BP 132/88 | HR 66 | Temp 98.3°F | Ht 62.0 in | Wt 147.5 lb

## 2016-08-10 DIAGNOSIS — I5022 Chronic systolic (congestive) heart failure: Secondary | ICD-10-CM | POA: Diagnosis not present

## 2016-08-10 DIAGNOSIS — I10 Essential (primary) hypertension: Secondary | ICD-10-CM

## 2016-08-10 DIAGNOSIS — J449 Chronic obstructive pulmonary disease, unspecified: Secondary | ICD-10-CM | POA: Diagnosis not present

## 2016-08-10 DIAGNOSIS — N183 Chronic kidney disease, stage 3 unspecified: Secondary | ICD-10-CM

## 2016-08-10 DIAGNOSIS — F22 Delusional disorders: Secondary | ICD-10-CM

## 2016-08-10 DIAGNOSIS — I872 Venous insufficiency (chronic) (peripheral): Secondary | ICD-10-CM

## 2016-08-10 DIAGNOSIS — M545 Low back pain, unspecified: Secondary | ICD-10-CM

## 2016-08-10 DIAGNOSIS — G8929 Other chronic pain: Secondary | ICD-10-CM

## 2016-08-10 DIAGNOSIS — I890 Lymphedema, not elsewhere classified: Secondary | ICD-10-CM

## 2016-08-10 DIAGNOSIS — D464 Refractory anemia, unspecified: Secondary | ICD-10-CM | POA: Insufficient documentation

## 2016-08-10 DIAGNOSIS — J4489 Other specified chronic obstructive pulmonary disease: Secondary | ICD-10-CM

## 2016-08-10 MED ORDER — ALBUTEROL SULFATE HFA 108 (90 BASE) MCG/ACT IN AERS: INHALATION_SPRAY | RESPIRATORY_TRACT | 3 refills | Status: DC

## 2016-08-10 MED ORDER — FLUTICASONE-SALMETEROL 115-21 MCG/ACT IN AERO: INHALATION_SPRAY | RESPIRATORY_TRACT | 3 refills | Status: DC

## 2016-08-10 MED ORDER — TRAMADOL HCL 50 MG PO TABS: 50.0000 mg | ORAL_TABLET | Freq: Three times a day (TID) | ORAL | 3 refills | Status: DC | PRN

## 2016-08-10 NOTE — Progress Notes (Signed)
Subjective:    Patient ID: [name], female    DOB: 03/04/1933, 82 y.o.   MRN: [id number]  HPI 82 y/o BF here for a follow up visit... she has multiple medical problems as noted below...   ~  SEE PREV EPIC NOTES FOR OLDER DATA >>    CXR 7/13 showed cardiomeg, tortuous Ao, clear lungs, osteopenia, DJD spine, NAD...   LABS 7/13:  Chems- ok w/ K=3.6 & TProt=9.4;  CBC- Anemic w/ Hg=10.4 & Fe=60;  TSH=0.90;  SPE/IEP= elev IgG & IgA but no monoclonal spike...  ADDENDUM>> 24H Urine 7/13 showed polyclonal incr free kappa light chains, no monoclonal Bence-Jones protein, Total prot excretion= '171mg'$ /day, Cr clearance=54 ml/min.  ~  April 04, 2013:  33moROV & true to form [name] missed mult appts over the last yr & hx obtained from EPIC records> in Sept2013 she was brought to the ER by the police at request of the son who took out involuntary commitment papers on his mother- noting that she & her husb were incapable of caring for themselves, living in unsanitary conditions, not taking meds, plus her longstanding delusional paranoid behavior;  ER physician easily identified her delusional state, noted her LE lymphedema, etc;  Labs showed Anemia, elev TProt & low Alb, & he was cleared for Psychiatric eval;  She was seen by DrJonnalagadda for Psyche (clearly he did not review any of my previous medical notes)- UNBELIEVABLY HE CONCLUDED THAT SHE HAD NO PSYCHOSIS & DID NOT MEET THE CRITERIA FOR PSYCHIATRIC HOSPITALIZATION, THEY RESCINDED THE PETITION FOR INVOLUNTARY COMMITMENT, NO MEDS GIVEN AND [place](which of course she did to pursue)...  She is here w/ her husband today- the first time I have met him- & he is elderly, appears chronically ill, has a speech impediment, & when asked for info just agrees with whatever his wife says including her delusions...     COPD> on Theo200Bid, ProventilHFA prn; states her breathing is ok, clearly she is very sedentary & not getting  any physical activity...    HBP> on Demadex20-2Qam; BP= 138/70 & she denies CP, palpit, SOB, change in her severe edema...    VI, chronic lymphedema & severe disease> on salt restriction, elevation, Demadex, & Atarax25 prn itching;  She has severe "end-stage" periph lymphedema in LEs, skin thickened, excoriated, w/ some weeping; we will Rx & request visiting nurses & ret to clinic.    Hx LBP> on Vicodin as needed; she could not tell me how often she takes this med; she denies current LBP or radiation to her legs...    Hx renal calculus> remote hx kid stone; renal function has been wnl...    DJD, VitD defic> on Vicodin, VitD50K/wk; Vit D level was <10 in 2012 7 she was started on VitD 50K weekly & asked to take it every week...     PSYCHIATRIC DISORDER> on Xanax0.5 prn; Today she rambles about the "workman's comp people" who are taking the enamel & gold from her teeth, paint her cabinets diff shades of brown, etc; all this occurs at night while she is "gassed" etc; I have told her that the only people who can help her w/ the "workman's comp people" are the psychiatrists but she refuses referral etc...    Anemia, abn serum proteins, ?MGUS> on Fe+VitC; see epic lab section> labs 6/14 shows Hg=8.7,  We reviewed prob list, meds, xrays and labs> see below for updates >> she did not bring med bottles  or med list to the OV today- it is doubtful she is taking any of her meds regularly...  CXR 6/14 showed heart at upper lim of norm, calcif tort Ao, clear lungs, sl elev right hemidiaph...  LABS 6/14:  Chems- ok w/ Cr=1.3, but TProt=9.9 & Alb=2.3;  CBC- anemic w/ Hg=8.7, MCV=80;  TSH=0.79;  B12>1500;  VitD=53;  SPE/IEP-  No monoclonal prot but marked incr in IgG & IgA...  Iron studies and stool cards are pending...  PLAN>> we will try to get home health assessment (she refused!); refer back to L[facility] Clinic at [facility], refer to Heme regarding Anemia & abn serum proteins; refer to GI regarding Anemia & never  had GI eval...  ~  October 17, 2013:  4moROV & [name]'s husb has passed away & her niece has come into town to help care for her- this is the 1st time ever that I have seen a family member here w/ this pt- when last seen we ordered 2DEcho, Hematology consult and GI consult; Epic appt hx indicates that she cancelled the 2DEcho 3 times, cancelled the Heme eval w/ DrShadad 8/19 & never rescheduled, cancelled/ no showed for the GI appt 5 times!!!    Psyche> Nothing much has changed- Sophia Pitcheris still talking about the [place] people & all the things they do to her (psychiatric paranoid delusion) & she has repeatedly refused psychiatric consultation...      Severe VI, Lymphedema in legs, stasis dermatitis etc> She still has very severe lymphedema in her legs & venous stasis changes- supposed to be on Demadex20- 2Qam, sodium restriction, leg elevation and local leg therapy w/ ACE wraps; recall hx of treatment in the [facility] phys therapy dept wound care/ lymphedema clinic but she stopped going ~135yrgo & would not return; she has been instructed on leg cleansing, application of silvadene cream, gauze wrap; her niece has done her homework and [facility]/ TeStephani Daniel [name] we will call to set this up; in the meanwhile we reviewed our rec treatment protocol for them to do at home...    Anemia, MGUS, progressive serum protein abn> She has the chronic anemia and serum protein abnormality (elev TProt & low Alb) w/ prev MGUS identified (7/12 SPE/IEF showed an IgG kappa paraprotein accounting for 1.12 g/dL of the total 3.10 g/dL of protein in the gamma region) but subseq SPE/IEP has not shown a M-spike; Quant IG's have shown a steady incr in the IgG & IgA levels=> heme consult requested but she never went... WE WILL RESCHED THE HEME EVALUATION...    GI symptoms>  She has numerous GI complaints w/ nausea, intermittent diarrhea, and she has never allowed any sort of GI  screening eval (eg- EGD/colon); she had an Abd Sonar 2004 w/ gallstones and ?2.8cm right renal mass; subseq CT Abd 2004 showed prominent column of Bertin, several sm renal cysts, 2cm right adrenal adenoma, atheromatous changes in Ao; DrPBallen did a LapChole at that time; she has Zofran4m75mrn and instructed to use Immodium prn; as noted she is anemic & Fe level is low (33 in 2012, 60 in 2013 on oral iron supplements); we will reschedule her GI evaluation....     We reviewed prob list, meds, xrays and labs> see below for updates >> SHE DID NOT BRING ANY OF HER MED BOTTLES TO THE OFFICE TODAY & WE CANNOT CONFIRM WHAT SHE'S BEEN TAKING...   LABS 12/14:  Chems-  ok w/ Cr=1.3;  LFTs= wnl but TProt=10.2 & Alb=3.0;  CBC- anemic w/ Hg=8.8 & MCV=80...  ADDENDUM:  2DEcho ==> done 11/06/13 & shows combined sys & diastolic CHF w/ TK=24-09% assoc w/ septal AK & otherw diffuse HK plus Gr2DD, no ASD or PFO, PAsys=50... [place] should be supervising her meds now & we will refer to the CHF clinic for their help... Appt pending w/ the [facility] Lymphedema clinic per daugh request... Appts pending w/ Heme- DrShadad and GI- DrKaplan for further eval of her anemia...  ~  April 14, 2014:  3moROV & [name] returns w/ her relative today asking for something to give her energy, wants B12 shot; also c/o some SOB "breathing problem" and wants a shot for her asthma; notes mild dry cough, no sput or hemoptysis, no f/c/s/ etc... She has had mult follow up visits w/ her specialists over the interval >> she did not bring her med bottles or home med list today...    Pulm- on Theodur200Bid & AlbutHFA prn; breathing appears to be at baseline & there is no wheezing etc; PFT today shows very min effort & invalid result (looks like severe restriction, see below)...     Cards- on Coreg3.125 (was supposed to incr to 6.25- ?never did?), Lisin10, Demadex20-2Bid, K20Tid (hard to swallow big pills); She had Cards f/u 1/15 w/ DrSmith & NP-Gerhardt (notes  reviewed)> sys heart failure, HBP, VI & lymphedema; BP= 112/62, contin same.      EKG 1/15 showed NSR, rate99, rightward axis, septal infarct age undetermined, no acute STTW changes...     2DEcho 1/15 showed norm cavity size & wall thickness, LVF decr w/ EF=35-40% w/ septal AK & diffuse HK, Gr2DD, incr PAsys=50     MYOVIEW 3/15 showed no CP, no STchanges, norm perfusion w/ mild apic thinning, no scar/ no ischemia/ norm wall motion & EF=50%...     Vasc- She has severe VI, chr venous stasis changes, and lymphedema> [place] but it is unclear to me if she ever started this therapy (no notes scanned into Epic)...     GI- on Zofran4 prn; She saw DrKaplan for GI 1/15 (note reviewed)> Abd pain & n/v, known gallstone on prev scans; he rec check stools for blood (neg x1), EGD & Colon (she never sched these tests)...    Ortho- on Vicotid prn pain & VitD50K weekly    Heme- on FeSO4 Bid; She saw DrShadad for Heme 1/15 (note reviewed)>  Anemia & abn SPE/ IEP with steadily increasing IgG & IgA levels but no clear monoclonal peak; he has rec oral iron therapy & observation for now; last Hg was 1/15 = 10.6, up from 8.8    Psyche- on Xanax0.5prn; she remains paranoid & incapable of self care, she has refused psychiatric services, daughter is now involved w/ her care... We reviewed prob list, meds, xrays and labs> see below for updates >>   PFT 6/15 showed FVC=0.80 (43%), FEV1=0.59 (43%), %1sec=74, mid-flows= 34% predicted... This is a min effort & invalid results (restrictive dis & sm airways dis is suggested)...   LABS 6/15:  Chems- ok w/ BS=134, A1c=5.5, BUN=28, Cr=1.4 (stable);  CBC- Hg=9.2, MCV=84, Fe=43 (16%sat);  TSH=1.04;  VitD=77;  B12=631;  BNP=191;  SED=110;  SPE/IEP f/u are pending...   ~  February 11, 2015:  171moOV & [name] is here w/ her relative who is back in town to help w/ her needs;  BaRaford Pitcheras no  specific complaints today- but remains  confused and paranoid, again talking about the worker's comp people coming into her home and painting her paneling, placing listening devices in the neighborhood rabbits, etc... EPIC records reviewed>>    Her breathing is stable> on Theodur200Bid (ran out some time ago) and Proair rescue inhaler; she has mult chronic complaints but no acute cough, sput, hemoptysis, dyspnea, etc...     She saw Cards/ DrHSmith last 03/2014> combined sys&diast CHF, bilat LE lymphedema (L>R); on Coreg3.125Bid, Lisin10, Demadex20-2Bid, K20Tid; Myoview 12/2013 showed improved LVEF~50% & no ischemia;  Her weight is the same ~137# and exam unchanged (?decr swelling in right leg & severe lymphedema in left leg the same); labs showed K=4.2, BUN=38, Cr=1.4, Alb=3.1, BNP=191; she was referred to lymphedema clinic in [place], no med changes...    She saw Heme/ DrShadad 03/2014> multifactorial anemia &elev immunoglobulins (but not felt to have a plasma cell disorder)- elev IgG & IgA but no Mspike, felt to be most likely reactive in nature & they plan watchful waiting to monitor her labs, CBC, protein levels, etc; Labs 6/15 reviewed w/ Hg=9.1, Hct=28, Fe=81 (30%sat), Ferritin=120, B12=631, SPE- see report w/ elev IgG, IgA, no Mspike.... She "no showed" or canceled several appts in Feb2016...    She saw Podiatry/ DrHyatt 04/2014> she had toenails debrided; we do not have notes from lymphedema clinic in [place]...    She saw GI/ DrKaplan 11/2014> he had performed EGD (normal) and Colon (large rectal polyp w/ high grade dysplasia removed, severe divertics, & hems) in 07/2014;  Gastric empty scan was normal; she c/o chr N&V and they felt it was from her nerves & she is treated w/ Zofran4 prn...  We reviewed prob list, meds, xrays and labs> see below for updates >> meds refilled per request...  CXR 4/16 showed mild cardiomeg, clear lungs/ NAD, arthritis in Tspine...   LABS 4/16:  Chems- stable w/ K=3.5, BUN=32, Cr=1.35, NOTE- TProt incr to 10.1 w/  Alb=3.2;  CBC- Hg=9.4, B12 >1500, BNP=262...  PLAN>>  She insisted on a Depo shot for her breathing & a B12 shot for energy- we discussed this & agreed to the former but not the latter; she is encouraged to f/u w/ DrSmith & DrShadad + the [place] lymphedema clinic...  ~  August 13, 2015:  53moROV & [name] has yet to pick a PCP as I am now part-time and Pulmonary only> pt still lives at home, grand children help & she has 12H nighttime attendence (7P-7A) from "seniors helping seniors"...     [name]'s breathing is stable but she c/o a non-productive cough & intermittent wheezing; she has chr stable SOB/ DOE but denies CP/ f/c/s, etc;  She is ?taking Theo24 4069md and has an AlbutHFA rescue inhaler that she uses occas;  We decided to add ADVAIR-HFA 115/21 at 2spBid;  We gave her the 2016 FLU vaccine today...    She continues to follow up w/ DrHSmith for Cards> seen 9/28 w/ her HBP, chr sys CHF w/ EF 35-40% (2015 w/ diffuse HK) and subseq perfusion study w/ EF=50%, chr venous insuffic & severe LE lymphedema; her weight was up & he adjusted her meds- increased Lisinopril to 2061m, & increased Demadex to 33m36md; he continues to follow her regularly...     [facility] but now has visiting nurses doing home therapy; she tells me that they indicate she can get specail shoes & a wedge for the bed to aide  in elevating legs- Rx written at her request...    She was [facility] in [place] for weakness and profound anemia w/ Hg=5.4; EGD was unrevealing; Sigmoidoscopy showed a sm rectal polyp (hyperplastic); she had a large adenomatous poly in rectum resected in 2015; she has a known MGUS followed by Heme-DrShadad; she was transfused & followed up w/ GI-DrKaplan w/ Hg improved to 11.5, & she is supposed to be taking Fe w/ VitC Bid... [facility], then sent home w/ visiting nurses...     She had a fall in June w/ scalp hematoma and HAs; subseq MRI  Brain was neg- norm for age, NAD, Cspine degen disc dis & prev C4-5 fusion...     As prev noted- she has a severe psychiatric problem w/ paranoid delusions regarding workman's comp people & this persists ... We reviewed prob list, meds, xrays and labs>   CXR 04/06/15 showed mild cardiomeg, tortuous ao, clear lungs, NAD- w/o rib fx/ pneumothorax/ effusion...  LABS: reviewed in Epic... IMP/PLAN>>  We decided to start [place]- 2spBid in addition to her OEVO35 & Albut rescue inhaler;  She will establish w/ new PCP & continue f/u w/ her Cards/ GI/ Heme specialists;  We will see her back in 40mo sooner if needed for pulmonary issues;  Given 2016 FLU vaccine today...   ~  February 08, 2016:  682moOV & [name] looks much better, she [facility] her, also a nephew in town...    [name]'s breathing is stable & she notes a non-productive cough & intermittent wheezing; she has chr stable SOB/ DOE but denies CP/ f/c/s, etc;  She is ?taking Theo24 '400mg'$ /d, [id number] has an AlbutHFA rescue inhaler that she uses occas;  She did not bring med bottles or up-to-date list to the OV today...     Cards f/u 02/03/16 w/ PA> HBP, combined Sys/DiastCHF (EF 35-40% by Echo & 60% by Myoview), VI/ lymphedema=> better w/ Rx [place] still gets L leg wrapped; no changes made to her meds...    She has seen Nephrology- DrPatel but note is pending...     Neuro f/u DrJaffe- 11/17/15> tension HAs, degen dis in Cspine, paranoia, & mult medical issues; note reviewed, placed on Gabapentin100Qhs, asked to limit Tramadol to Bid...  EXAM shows Afeb, VSS, O2sat=100%;  HEENT- neg, mallamapti2;  Chest- decr BS, clear w/o w/r/r;  Heart- RR Gr1/6SEM w/o r/g;  Abd- soft, nontender, neg;  Ext- lymphedema improved, L leg wrapped;  Neuro- intact x paranoia.  LABS 02/08/16>  Chems- ok x K=3.4, BUN=29, Cr=1.47, BNP=66;  CBC- Hg=10.9, mcv=84;  Fe=66 (23%sat), Ferritin=414;  TSH=0.69;  SPE/IEP w/ diffuse  polyclonal incr in Gamma & IgG is improved from previous.... IMP/PLAN>>  We discussed changing her Iron pill to ferrtosequels due to constip & adding in Miralax & Senakot-S as needed; she is reminded to bring all med bottles to every office visit as she has a number of providers and meds change from time-to-time...   ~  August 10, 2016:  38m40moV & BarRaford Pitcher here by herself today, says she took cab to the office, still lives in her home with "my sorry stepson" but mostly on her own w/ "[employer]" staying at night 7P-7A; she has mult nieces who come to help & take turns she says...  She continues w/ her same paranoid delusions that she has had since I have known her-  she says the disability people hide her shoes and she pretends not to see them; her stepson sells her clothes and wants to take her house she says; she apparently does her own meds... We reviewed the following medical problems during today's office visit >> she is supposed to be estaqblishing care via [facility] & Dr. Gildardo Cranker...    COPD> on Theo24-400/d, [id number], ProventilHFA prn; states her breathing is ok, clearly she is very sedentary & not getting any physical activity...    HBP, sysCHF w/ EF=35-40% by Echo & 50% by Nuclear study in 2015> on Coreg6.25Bid, Demadex20-3Bid, K20Bid per DrHSmith; BP= 132/88 & she denies CP, palpit, SOB, change in her severe edema; last seen by Cards 05/2016- they noted edema returning since family has left=> need to ret to [place] lymphedema clinic...    VI, chronic lymphedema & stasis dermatitis, etc> on salt restriction, elevation, Demadex, & several creams;  She has severe periph lymphedema in LEs L>R, skin thickened, excoriated, no breakdown/ no weeping; she has received the best results from PT clinic at [facility] w/ wraps etc but only lost the fluid when maintained by her nieces at home! We reviewed no salt, elevation, ACE wraps & ret to Lymphedema clinic...    GI  symptoms>  She has numerous GI complaints w/ nausea, intermittent diarrhea, and she has never allowed any sort of GI screening eval (eg- EGD/colon); she had an Abd Sonar 2004 w/ gallstones and ?2.8cm right renal mass; subseq CT Abd 2004 showed prominent column of Bertin, several sm renal cysts, 2cm right adrenal adenoma, atheromatous changes in Ao; DrPBallen did a LapChole at that time; she has Zofran'4mg'$  prn and instructed to use Immodium prn. She was [facility] 02/2015 w/ Hg=5.4> GI eval DrKaplan- EGD neg, Sigmoid +sm rectal polyp (she had large rectal adenomatous poly removed 2015...    Hx renal insuffic & renal calculus> remote hx kid stone; renal function showed sl incr BUN/Cr w/ incr diuretics- on Demadex20-3Bid & last LABS 06/2016 showed BUN=29, Cr=1.44, K=3.7; Sonar 12/2015 showed medical renal dis, tiny bilat cysts, otherw neg exam;  She saw DrPatel 12/2015-- note reviewed....    Hx LBP, Hx Cspine disc dis & fusion> she is c/io LBP & wants pain med;  We discussed trial TRAMADOL 50Tid prn;  She saw DrJaffe for HAs 03/2016=> started on Gabapentin, ?he sent soc worker to the house.    DJD, VitD defic> Vit D level was <10 in 2012 & she was started on VitD 50K weekly & asked to take it every week; last f/u VitD level was 03/2013 = 53; she is not currently taking VitD supplement...    PSYCHIATRIC DISORDER & HAs> prev on Xanax0.5 prn; Continually rambles about the "workman's comp people" who are taking the enamel & gold from her teeth, paint her cabinets diff shades of brown, etc; all this occurs at night while she is "gassed" etc; I have told her that the only people who can help her w/ the "workman's comp people" are the psychiatrists but she refuses referral/meds/ etc;  She saw DrJaffe/ Neuro 03/2016 for HAs and paranoia    Anemia, ?MGUS, progressive serum protein abn> She has the chronic anemia and serum protein abnormality (elev TProt & low Alb) w/ prev MGUS identified (7/12 SPE/IEF showed an IgG kappa paraprotein  accounting for 1.12 g/dL of the total 3.10 g/dL of protein in the gamma region) but subseq SPE/IEP has not shown a M-spike; Quant IG's have shown a steady incr in  the IgG & IgA levels=> heme consult by DrShadad in 2015 w/ multifactorial anemia, elev serum prot's but no clear cut monoclonal gammopathy; proteins improved w/ Rx of legs & improved lymphedema; on Fe+VitC; see epic lab section> last labs 01/2016 showed Hg=10.9, mcv=84, Fe=66 (23%sat), Ferritin=414... EXAM shows Afeb, VSS, O2sat=100% on RA;  HEENT- neg mallampati2;  Chest- clear w/o w/r/r;  Heart- RR gr1/6SEM w/o r/g;  Abd- min epig tender otherw neg;  Ext- VI, chr edema L>>R;  Neuro- sl neuropathy, weak, non-focal.  LABS 06/2016 in Epic reviewed>  BUN=29, Cr=1.44, K=3.7  EKG 03/2016 w/ NSR, rate69, wnl, NAD... IMPPLAN>>  Multisystem dis & her main issues remain lymphedema in LEs, Paranoid delusions/ Psyche, Anemia & renal... She needs to f/u w/ [place] PT/OT department for ongoing Rx fof her Lymphedema; otherw she will keep her appts w/ various specialists for their help...           Problem List:  OBSTRUCTIVE CHRONIC BRONCHITIS (ICD-491.20) >>  ~  on PROVENTIL HFA- 2spBid, QVAR 80- 2spBid, THEODUR '200mg'$ Bid, +Mucinex Prn> she won't change her med Rx due to her paranoia; stable on the regimen without wheezing, cough, sputum, change in SOB, etc... ~  she had Pneumovax in 2007 ~  CXR 7/13 showed cardiomeg, tortuous Ao, clear lungs, osteopenia, DJD spine, NAD.[name]. ~  CXR 6/14 showed borderline cardiomeg, calcif tortuous Ao, sl elev right hemidiaph & clear lungs- NAD, DJD Tspine... ~  PFT 6/15 showed FVC=0.80 (43%), FEV1=0.59 (43%), %1sec=74, mid-flows= 34% predicted... This is a min effort & invalid results (restrictive dis & sm airways dis is suggested). ~  4/16: she feels that the Theodur200Bid reallly helps her breathing & it is refilled per request; she has ProairHFA for prn use...  ~  CXR 4/16 showed mild cardiomeg, clear lungs/ NAD,  arthritis in Tspine... ~  CXR 6/16 showed mild cardiomeg, tortuous ao, clear lungs, NAD- w/o rib fx/ pneumothorax/ effusion... ~  10/16:  [name]'s breathing is stable but she c/o a non-productive cough & intermittent wheezing; she has chr stable SOB/ DOE but denies CP/ f/c/s, etc;  She is ?taking Theo24 '400mg'$ /d and has an AlbutHFA rescue inhaler that she uses occas;  We decided to add ADVAIR-HFA 115/21 at 2spBid;  We gave her the 2016 FLU vaccine today...   CARDS MANAGED by DrHSmith >>   HYPERTENSION (NIO-270.3) >>  SYSTOLIC & DIASTOLIC CHF >> eval & management by DrHSmith... ~  7/12:  BP= 122/84 & not checking BP at home; takes meds regularly she says & tol well; she denies HA, visual changes, CP, palipit, syncope, dyspnea, etc.. ~  7/13:  BP= 148/82 & she denies CP, palpit, SOB; has severe chronic lymphedema in bilat LEs... ~  6/14:  on Demadex20-2Qam; BP= 138/70 & she denies CP, palpit, SOB, change in her severe edema... ~  12/14: on Demadex20-2Qam; BP= 126/84 & she denies any acute symptoms... ~  She had Cards eval 1/15 w/ DrSmith & NP-Gerhardt (notes reviewed)> sys heart failure, HBP, VI & lymphedema; meds adjusted- Coreg, Lisinopril, Demadex, KCl... ~  EKG 1/15 showed NSR, rate99, rightward axis, septal infarct age undetermined, no acute STTW changes... ~  2DEcho 1/15 showed norm cavity size 7 wall thickness, LVF decr w/ EF=35-40% w/ septal AK & diffuse HK, Gr2DD, incr PAsys=50 ~  MYOVIEW 3/15 showed no CP, no STchanges, norm perfusion w/ mild apic thinning, no scar/ no ischemia/ norm wall motion & EF=50%...  ~  6/15: on Coreg3.125 (was supposed to incr to 6.25- ?never  did?), Lisin10, Demadex20-2Bid, K20Tid (hard to swallow big pills); BP= 112/62, BNP= 191; contin same rx & f/u w/ Cards... ~  She had Cards f/u DrHSmith 6/15> on meds above, felt to be stable, no changes made; referred to [facility] l[facility]hed[facility] but ?if she went? ~  4/16: combined sys&diast CHF, bilat LE lymphedema (L>R)> on  Coreg3.125Bid, Lisin10, Demadex20-2Bid, K20Tid; Myoview 12/2013 showed improved LVEF~50% & no ischemia;  Her weight is the same ~137# and exam unchanged (?decr swelling in right leg & severe lymphedema in left leg the same); labs showed K=3.5, BUN=32, Cr=1.35, Alb=3.1, BNP=262...   VENOUS INSUFFICIENCY (ICD-459.81) & EDEMA/ LYMPHEDEMA in Legs - she has severe chr ven insuffic, stasis changes, chr lymphedema... she is supposed to eliminate salt, elevate legs, & wear support hose- but I suspect she doesn't do any of these things... takes the Demedex & KCl as above...  ~  Venous Dopplers 11/09 were neg- no DVT, no superfic thrombosis, no Baker's cyst... ~  She reports that she has seen DrWoods for Derm help in the past. ~  She was referred to the wound care clinic in 2012 but I don't think she ever went as scheduled... ~  7/13:  We will refer her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

## 2016-08-10 NOTE — Patient Instructions (Signed)
Today we updated your med list in our EPIC system...    Continue your current medications the same...    We refilled your meds per request...  We wrote a new prescription for TRAMADOL 50mg  one tab up to 3 times daily as needed for pain...  Continue your regular follow up appts w/ DrHSmith for CARDS, DrPATEL for Nephrology, & DrJaffe for Neuro...  Call for any questions...  Let's plan a follow up visit in 29mo, sooner if needed for breathing problems.Marland KitchenMarland Kitchen

## 2016-08-14 DIAGNOSIS — N183 Chronic kidney disease, stage 3 (moderate): Secondary | ICD-10-CM | POA: Diagnosis not present

## 2016-08-14 DIAGNOSIS — Z6827 Body mass index (BMI) 27.0-27.9, adult: Secondary | ICD-10-CM | POA: Diagnosis not present

## 2016-08-14 DIAGNOSIS — D638 Anemia in other chronic diseases classified elsewhere: Secondary | ICD-10-CM | POA: Diagnosis not present

## 2016-08-14 DIAGNOSIS — N2581 Secondary hyperparathyroidism of renal origin: Secondary | ICD-10-CM | POA: Diagnosis not present

## 2016-08-14 DIAGNOSIS — I10 Essential (primary) hypertension: Secondary | ICD-10-CM | POA: Diagnosis not present

## 2016-08-18 ENCOUNTER — Ambulatory Visit: Payer: Medicare Other | Admitting: Internal Medicine

## 2016-08-22 ENCOUNTER — Ambulatory Visit (INDEPENDENT_AMBULATORY_CARE_PROVIDER_SITE_OTHER): Payer: Medicare Other | Admitting: Podiatry

## 2016-08-22 ENCOUNTER — Encounter: Payer: Self-pay | Admitting: Podiatry

## 2016-08-22 VITALS — BP 134/85 | HR 76 | Resp 16

## 2016-08-22 DIAGNOSIS — M79676 Pain in unspecified toe(s): Secondary | ICD-10-CM

## 2016-08-22 DIAGNOSIS — B351 Tinea unguium: Secondary | ICD-10-CM

## 2016-08-22 NOTE — Progress Notes (Signed)
She presents today chief complaint of bilateral toe pain. States that my toenails are getting too long.  Objective: Vital signs are stable alert and oriented 3. Pulses are palpable. Toenails are thick yellow dystrophic onychomycotic painful palpation.  Assessment: Pain limp secondary to onychomycosis.  Plan: Debridement of toenails 1 through 5 bilateral.

## 2016-09-19 ENCOUNTER — Ambulatory Visit (INDEPENDENT_AMBULATORY_CARE_PROVIDER_SITE_OTHER): Payer: Medicare Other | Admitting: Pulmonary Disease

## 2016-09-19 ENCOUNTER — Encounter: Payer: Self-pay | Admitting: Pulmonary Disease

## 2016-09-19 VITALS — BP 118/80 | HR 75 | Temp 97.1°F | Wt 143.5 lb

## 2016-09-19 DIAGNOSIS — J4489 Other specified chronic obstructive pulmonary disease: Secondary | ICD-10-CM

## 2016-09-19 DIAGNOSIS — I10 Essential (primary) hypertension: Secondary | ICD-10-CM

## 2016-09-19 DIAGNOSIS — N183 Chronic kidney disease, stage 3 unspecified: Secondary | ICD-10-CM

## 2016-09-19 DIAGNOSIS — I872 Venous insufficiency (chronic) (peripheral): Secondary | ICD-10-CM

## 2016-09-19 DIAGNOSIS — I890 Lymphedema, not elsewhere classified: Secondary | ICD-10-CM

## 2016-09-19 DIAGNOSIS — F22 Delusional disorders: Secondary | ICD-10-CM

## 2016-09-19 DIAGNOSIS — J449 Chronic obstructive pulmonary disease, unspecified: Secondary | ICD-10-CM

## 2016-09-19 DIAGNOSIS — I5022 Chronic systolic (congestive) heart failure: Secondary | ICD-10-CM | POA: Diagnosis not present

## 2016-09-19 DIAGNOSIS — M25562 Pain in left knee: Secondary | ICD-10-CM | POA: Diagnosis not present

## 2016-09-19 DIAGNOSIS — D464 Refractory anemia, unspecified: Secondary | ICD-10-CM

## 2016-09-19 NOTE — Patient Instructions (Signed)
Today we updated your med list in our EPIC system...    Continue your current medications the same...    We discussed STOPPING the Theophyllin (Theo-24) as this is adding little benefit to your inhalers...  We will arrange for an [facility] evaluation for your left knee pain & swelling...  In the meanwhile you can apply a warm towel/ heating pad as needed, and use the TRAMADOL + Tylenol for pain...  Call for any questions.Marland KitchenMarland Kitchen

## 2016-09-19 NOTE — Progress Notes (Signed)
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 is requesting refill of ALPRAZOLAM 0.5m- 1/2 to 1 tab by mouth three times a day as needed for nerves, and HYDROXYZINE 291m 1 tab by mouth Qid as needed for itching... ~  Tried Risperdone 67m80mhs but apparently no better on this med at this dose... she refused Psychiatric help. ~  6/13:  She went to the ER w/ "pain all over" claiming again that it was from glass shards placed under her skin by neighbors; of course- no glass was found & XRays of legs/feet were neg for foreign bodies; severe chr ven insuffic & lymphedema confirmed; no change in Rx... ~   in Sept2013 she was brought to the ER by the police at request of the son who took out involuntary commitment papers on his mother- noting that she & her husb were incapable of caring for themselves, living in unsanitary conditions, not taking meds, plus her longstanding delusional paranoid  behavior;  ER physician easily identified her delusional state, noted her LE lymphedema, etc;  Labs showed Anemia, elev TProt & low Alb, & he was cleared for Psychiatric eval;  She was seen by DrJonnalagadda for Psyche (clearly he did not review any of my previous medical notes)- UNBELIEVABLY HE CONCLUDED THAT SHE HAD NO PSYCHOSIS & DID NOT MEET THE CRITERIA FOR PSYCHIATRIC HOSPITALIZATION, THEY RESCINDED THE PETITION FOR INVOLUNTARY COMMITMENT, NO MEDS GIVEN AND RE[facility]ERRED [facility] TO [facility] did to [facility])... ~  6/14:  She continues w/ her paranoid delusions & now states they are taking the enamel off her teeth, etc; she continues to refuse help; I met her husb for the 1st time today- he is elderly, appears chronically ill, has a speech impediment, & when asked for info just agrees with whatever his wife says including her delusions... ~  6/15:  No change in her psychiatric illness... ~  4/16:  Continued stable/ unchanged...  VITAMIN D DEFICIENCY (ICD-268.9) ~  labs 4/11 showed Vit D level = 12...Marland KitchenMarland Kitchen  rec> start 5000 u Vit D OTC daily (it isn't clear if she ever took the supplement). ~  Labs 7/12 showed Vit D level = under10... Rec> start Rx 50K weekly from now on... ~  7/13:  Pt supposed to still be on VitD 50K weekly, but she didn't bring med bottles to the OV; rec to continue same... ~  6/14:  Labs on VitD50K weekly showed VitD= 53 ~  6/15:  Labs showed Vit D level = 77 on 50K weekly...   HEME FOLLOWED by DrShadad >>   ANEMIA & MGUS >> on FeSO4 one daily w/ VitC... ~  labs 3/08 showed Hg= 12.1, MCV= 84 ~  labs 9/09 showed Hg= 11.5, MCV= 85 ~  labs 4/11 showed Hg= 11.2, MCV= 85, Fe= 37 (12%sat), B12 >1500, Folate= 17... rec> FeSO4 + VitC daily. ~  Labs 7/12 showed Hg= 10.9, MCV= 84, Fe= 33 (10%sat); SPE w/ IgG kappa monoclonal gammopathy- accounts for 1.12g/dL of the total 3.10 g/dL of protein in the gamma region (IgG & IgA are elev, and IgM is low norm)... ~   Labs 7/13 showed Hg= 10.4, Fe= 60 (19%sat);  SPE/IEP did NOT show a monoclonal paraprotein this time; but IgG & IgA levels were further elevated over last yrs numbers; we will check 24H urine for TProt/ creat clearance/ IEP==> ADDENDUM>> 24H Urine 7/13 showed polyclonal incr free kappa light chains, no monoclonal Bence-Jones protein, Total prot excretion= 120m/day, Cr clearance=54 ml/min. ~  Labs 6/14 showed Hg= 8.7, MCV= 80;  SPE/IEP did NOT show an M-spike, but IgG & IgA were further increased over the prev yrs & she is referred to Hematology for their consult=> she cancelled & never resched... ~  12/14: she has her niece here now to assume her care; we will resched the HEME appt... ~  She saw DrShadad for Heme 1/15 (note reviewed)>  Anemia & abn SPE/ IEP with steadily increasing IgG & IgA levels but no clear monoclonal peak; he has rec oral iron therapy & observation for now; last Hg was 1/15 = 10.6, up from 8.8..[name] ~  Labs 6/15 showed Hg= 10.6 on FeSO4 bid; she continues to f/u w/ DrShadad... ~  Labs 4/16 showed Hg=9.4, B12>1500 and she needs f/u DrShadad...   Past Surgical History:  Procedure Laterality Date  . APPENDECTOMY    . CERVICAL SPINE SURGERY     x 2  . CHOLECYSTECTOMY    . ESOPHAGOGASTRODUODENOSCOPY N/A 04/05/2015   Procedure: ESOPHAGOGASTRODUODENOSCOPY (EGD);  Surgeon: MLadene Artist MD;  [facility];  Service: Endoscopy;  Laterality: N/A;  . FLEXIBLE SIGMOIDOSCOPY N/A 04/05/2015   Procedure: FLEXIBLE SIGMOIDOSCOPY;  Surgeon: MLadene Artist MD;  Location: [facility];  Service: Endoscopy;  Laterality: N/A;  . HAND SURGERY    . LUMBAR DISC SURGERY    . PARTIAL HYSTERECTOMY    . TONSILLECTOMY      Outpatient Encounter Prescriptions as of 09/19/2016  Medication Sig  . albuterol (PROAIR HFA) 108 (90 Base) MCG/ACT inhaler INHALE 2 PUFFS EVERY 4 HOURS AS NEEDED FOR WHEEZING  . ALPRAZolam (XANAX) 0.5 MG tablet Take 1/2 to 1 tablet by mouth three times daily as needed  for nerves  . ascorbic acid (VITAMIN C) 500 MG tablet Take 1,000 mg by mouth daily.   . carvedilol (COREG) 6.25 MG tablet TAKE 1 TABLET BY MOUTH TWICE DAILY  . clotrimazole-betamethasone (LOTRISONE) cream Apply 1 application topically 2 (two) times daily. To her feet  . Ferrous Sulfate Dried 200 (65  FE) MG TABS Take 1 tablet by mouth 2 (two) times daily.  . fluticasone-salmeterol (ADVAIR HFA) 115-21 MCG/ACT inhaler USE 2 INHALATIONS TWICE A DAY  . gabapentin (NEURONTIN) 100 MG capsule Take 2 capsules (200 mg total) by mouth at bedtime.  . potassium chloride SA (K-DUR,KLOR-CON) 20 MEQ tablet Take 2 tablets (40 mEq total) by mouth once.  . silver sulfADIAZINE (SILVADENE) 1 % cream Apply 1 application topically daily as needed (for foot irritation).  . THEO-24 400 MG 24 hr capsule TAKE 1 CAPSULE DAILY  . torsemide (DEMADEX) 20 MG tablet TAKE 3 TABLETS TWICE A DAY  . traMADol (ULTRAM) 50 MG tablet Take 1 tablet (50 mg total) by mouth 3 (three) times daily as needed.  . triamcinolone cream (KENALOG) 0.1 % Apply 1 application topically 2 (two) times daily.  . Vitamin D, Ergocalciferol, (DRISDOL) 50000 units CAPS capsule Take 50,000 Units by mouth every 7 (seven) days.  . [DISCONTINUED] lisinopril (PRINIVIL,ZESTRIL) 20 MG tablet Take 20 mg by mouth daily.   No facility-administered encounter medications on file as of 09/19/2016.     Allergies  Allergen Reactions  . Aspirin Other (See Comments)    REACTION: upset stomach  . Codeine Other (See Comments)    REACTION: "MAKES MY BODY GO CRAZY; CRAMPS"  . Latex Nausea And Vomiting    Current Medications, Allergies, Past Medical History, Past Surgical History, Family History, and Social History were reviewed in [employer] record.    Review of Systems        See HPI - all other systems neg except as noted...   The patient complains of dyspnea on exertion, peripheral edema, and difficulty walking.  The patient denies  anorexia, fever, weight loss, weight gain, vision loss, decreased hearing, hoarseness, chest pain, syncope, prolonged cough, headaches, hemoptysis, abdominal pain, melena, hematochezia, severe indigestion/heartburn, hematuria, incontinence, muscle weakness, suspicious skin lesions, transient blindness, depression, unusual weight change, abnormal bleeding, enlarged lymph nodes, and angioedema.     Objective:   Physical Exam     WD, Overweight, 82 y/o BF in NAD... GENERAL:  Alert & oriented x 3... she has obvious paranoid delusions... HEENT:  [place]/AT, EOM-wnl, PERRLA, EACs-clear, TMs-wnl, NOSE-clear, THROAT-clear & wnl. NECK:  Supple w/ fairROM; no JVD; normal carotid impulses w/o bruits; no thyromegaly or nodules palpated; no lymphadenopathy. CHEST:  Clear to P & A; without wheezes/ rales/ or rhonchi heard.[name] HEART:  Regular Rhythm; without murmurs/ rubs/ or gallops detected... ABDOMEN:  Soft & nontender; normal bowel sounds; no organomegaly or masses palpated... EXT:  mod arthritic changes; +varicose veins/ +venous insuffic/ 4+edema in left foot w/ stasis dermatitis... NEURO:  CN's intact;  no focal neuro deficits... DERM:  chr lymphedema L>R & min dermatitis in skin of LE's  RADIOLOGY DATA:  Reviewed in the EPIC EMR & discussed w/ the patient...  LABORATORY DATA:  Reviewed in the EPIC EMR & discussed w/ the patient...   Assessment & Plan:    Left KNEE PAIN>>  She will attempt Rx w/ moist heat, heating pad, ALEVE vs TRAMADOL & we will refer to [facility] for their review; hopefully shots will help...   COPD, Chr Bronchitis>  She was on Qvar, Proventil, & Theodur and she changed to ADVAIR 100Bid for awhile & we tried to stop the Theodur;  It is always unclear what she is taking as she never brings bottles or list to the office follow up visits, but she stopped the Advair & prefers the Theodur200Bid stating that this helps  the most; has rescue inhaler for prn use... 08/13/15>  We decided to add  ADVAIR-HFA 115/21 at 2spBid & gave her the 2016 FLU vaccine today... 02/08/16>  Stable, edema improved, reminded to bring all meds to every OV...  09/19/16>  We reviewed all meds w/ family member & decided to stop the Theo-24, continue inhalers, etc...  HBP>  Controlled on Coreg, Demadex & KCl per DrHSmith; instructed on 2gm Na diet...  Ven Insuffic & Lymphedema LEs>  We reviewed the need to for 2gm sodium diet, elev legs, support hose vs ACE wraps, continue the diuretic rx; she received srevices from numerous PT depts, wound care clinics, lymphedema clinic etc;  She received the best service via [facility] lymphedema clinic...  Vit D Defic>  Her Vit D level is improved at 77, continue 50K weekly...  ANEMIA> She has a ?MGUS w/ IgG kappa paraprotein ident in 2012 but f/u labs in 2013-4 did not show monoclonal spike although both IgG & IgA levels are further elevated... She finally saw Heme- DrShadad & his notes are reviewed; he has her on oral iron therapy & observation...  Psychiatric Problem>  She persists w/ paranoid ideations revolving around worker's compensation disability;  She has repeatedly refused to go to mental health or allow me to refer to a psychiatrist;  She was seen in the ER on an Involuntary commitment petition 9/13 (by her son) but the psychiatrist rescinded the petition & sent her home...  She continues to refuse any psychiatric help...   Patient's Medications  New Prescriptions   No medications on file  Previous Medications   ALBUTEROL (PROAIR HFA) 108 (90 BASE) MCG/ACT INHALER    INHALE 2 PUFFS EVERY 4 HOURS AS NEEDED FOR WHEEZING   ALPRAZOLAM (XANAX) 0.5 MG TABLET    Take 1/2 to 1 tablet by mouth three times daily as needed for nerves   ASCORBIC ACID (VITAMIN C) 500 MG TABLET    Take 1,000 mg by mouth daily.    CARVEDILOL (COREG) 6.25 MG TABLET    TAKE 1 TABLET BY MOUTH TWICE DAILY   CLOTRIMAZOLE-BETAMETHASONE (LOTRISONE) CREAM    Apply 1 application topically 2  (two) times daily. To her feet   FERROUS SULFATE DRIED 200 (65 FE) MG TABS    Take 1 tablet by mouth 2 (two) times daily.   FLUTICASONE-SALMETEROL (ADVAIR HFA) 115-21 MCG/ACT INHALER    USE 2 INHALATIONS TWICE A DAY   GABAPENTIN (NEURONTIN) 100 MG CAPSULE    Take 2 capsules (200 mg total) by mouth at bedtime.   POTASSIUM CHLORIDE SA (K-DUR,KLOR-CON) 20 MEQ TABLET    Take 2 tablets (40 mEq total) by mouth once.   SILVER SULFADIAZINE (SILVADENE) 1 % CREAM    Apply 1 application topically daily as needed (for foot irritation).   THEO-24 400 MG 24 HR CAPSULE    TAKE 1 CAPSULE DAILY   TORSEMIDE (DEMADEX) 20 MG TABLET    TAKE 3 TABLETS TWICE A DAY   TRAMADOL (ULTRAM) 50 MG TABLET    Take 1 tablet (50 mg total) by mouth 3 (three) times daily as needed.   TRIAMCINOLONE CREAM (KENALOG) 0.1 %    Apply 1 application topically 2 (two) times daily.   VITAMIN D, ERGOCALCIFEROL, (DRISDOL) 50000 UNITS CAPS CAPSULE    Take 50,000 Units by mouth every 7 (seven) days.  Modified Medications   No medications on file  Discontinued Medications   LISINOPRIL (PRINIVIL,ZESTRIL) 20 MG TABLET    Take 20 mg  by mouth daily.

## 2016-09-25 ENCOUNTER — Ambulatory Visit: Payer: Medicare Other | Admitting: Pulmonary Disease

## 2016-10-11 ENCOUNTER — Emergency Department (HOSPITAL_COMMUNITY)
Admission: EM | Admit: 2016-10-11 | Discharge: 2016-10-12 | Disposition: A | Discharge status: Home / Self Care | Payer: Medicare Other | Attending: Emergency Medicine | Admitting: Emergency Medicine

## 2016-10-11 ENCOUNTER — Encounter (HOSPITAL_COMMUNITY): Payer: Self-pay | Admitting: Emergency Medicine

## 2016-10-11 ENCOUNTER — Emergency Department (HOSPITAL_COMMUNITY): Payer: Medicare Other

## 2016-10-11 DIAGNOSIS — I130 Hypertensive heart and chronic kidney disease with heart failure and stage 1 through stage 4 chronic kidney disease, or unspecified chronic kidney disease: Secondary | ICD-10-CM | POA: Insufficient documentation

## 2016-10-11 DIAGNOSIS — M542 Cervicalgia: Secondary | ICD-10-CM | POA: Diagnosis not present

## 2016-10-11 DIAGNOSIS — R404 Transient alteration of awareness: Secondary | ICD-10-CM | POA: Diagnosis not present

## 2016-10-11 DIAGNOSIS — Y9389 Activity, other specified: Secondary | ICD-10-CM | POA: Diagnosis not present

## 2016-10-11 DIAGNOSIS — N183 Chronic kidney disease, stage 3 (moderate): Secondary | ICD-10-CM | POA: Diagnosis not present

## 2016-10-11 DIAGNOSIS — W010XXA Fall on same level from slipping, tripping and stumbling without subsequent striking against object, initial encounter: Secondary | ICD-10-CM | POA: Insufficient documentation

## 2016-10-11 DIAGNOSIS — Y929 Unspecified place or not applicable: Secondary | ICD-10-CM | POA: Diagnosis not present

## 2016-10-11 DIAGNOSIS — W19XXXA Unspecified fall, initial encounter: Secondary | ICD-10-CM

## 2016-10-11 DIAGNOSIS — R42 Dizziness and giddiness: Secondary | ICD-10-CM | POA: Diagnosis not present

## 2016-10-11 DIAGNOSIS — Z91040 Latex allergy status: Secondary | ICD-10-CM | POA: Diagnosis not present

## 2016-10-11 DIAGNOSIS — S0990XA Unspecified injury of head, initial encounter: Secondary | ICD-10-CM | POA: Diagnosis not present

## 2016-10-11 DIAGNOSIS — Y999 Unspecified external cause status: Secondary | ICD-10-CM | POA: Diagnosis not present

## 2016-10-11 DIAGNOSIS — J45909 Unspecified asthma, uncomplicated: Secondary | ICD-10-CM | POA: Insufficient documentation

## 2016-10-11 DIAGNOSIS — F22 Delusional disorders: Secondary | ICD-10-CM | POA: Insufficient documentation

## 2016-10-11 DIAGNOSIS — S199XXA Unspecified injury of neck, initial encounter: Secondary | ICD-10-CM | POA: Diagnosis not present

## 2016-10-11 DIAGNOSIS — S8992XA Unspecified injury of left lower leg, initial encounter: Secondary | ICD-10-CM | POA: Diagnosis not present

## 2016-10-11 DIAGNOSIS — Z85038 Personal history of other malignant neoplasm of large intestine: Secondary | ICD-10-CM | POA: Diagnosis not present

## 2016-10-11 DIAGNOSIS — R51 Headache: Secondary | ICD-10-CM | POA: Diagnosis not present

## 2016-10-11 DIAGNOSIS — I5022 Chronic systolic (congestive) heart failure: Secondary | ICD-10-CM | POA: Insufficient documentation
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
## 2016-10-11 NOTE — ED Notes (Signed)
Pt's nephew has POA. Name is Alan [place] and his phone number is [phone]. Left to go home to get another vehicle to bring aunt home.
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
## 2016-10-11 NOTE — ED Notes (Signed)
Pt to radiology via stetcher

## 2016-10-11 NOTE — ED Notes (Addendum)
Pt states she fell 2 times today, one this morning and the other this evening. Pt complains of pain to her hands, neck, back, and L knee. Pt initially reported that she was dizzy and then shortly after denies any dizziness.    Family states the patient has had increased confusion for the past 2 weeks but the confusion started approximately 1 year ago after her husband passed. Family states the patient slid while getting into her bed, did not fall from a standing position. Nephew believes she has some undiagnosed dementia.

## 2016-10-12 ENCOUNTER — Emergency Department (HOSPITAL_COMMUNITY): Payer: Medicare Other

## 2016-10-12 DIAGNOSIS — S8992XA Unspecified injury of left lower leg, initial encounter: Secondary | ICD-10-CM | POA: Diagnosis not present

## 2016-10-12 DIAGNOSIS — S0990XA Unspecified injury of head, initial encounter: Secondary | ICD-10-CM | POA: Diagnosis not present

## 2016-10-12 DIAGNOSIS — S199XXA Unspecified injury of neck, initial encounter: Secondary | ICD-10-CM | POA: Diagnosis not present

## 2016-10-12 DIAGNOSIS — M542 Cervicalgia: Secondary | ICD-10-CM | POA: Diagnosis not present

## 2016-10-12 LAB — RAPID URINE DRUG SCREEN, HOSP PERFORMED
Amphetamines: NOT DETECTED
BARBITURATES: NOT DETECTED
Benzodiazepines: POSITIVE — AB
COCAINE: NOT DETECTED
OPIATES: NOT DETECTED
TETRAHYDROCANNABINOL: NOT DETECTED

## 2016-10-12 LAB — URINALYSIS, ROUTINE W REFLEX MICROSCOPIC
BILIRUBIN URINE: NEGATIVE
Glucose, UA: NEGATIVE mg/dL
HGB URINE DIPSTICK: NEGATIVE
KETONES UR: NEGATIVE mg/dL
NITRITE: NEGATIVE
PH: 6 (ref 5.0–8.0)
Protein, ur: NEGATIVE mg/dL
SPECIFIC GRAVITY, URINE: 1.005 (ref 1.005–1.030)

## 2016-10-12 LAB — CBC WITH DIFFERENTIAL/PLATELET
BASOS PCT: 0 %
Basophils Absolute: 0 10*3/uL (ref 0.0–0.1)
EOS ABS: 0.1 10*3/uL (ref 0.0–0.7)
EOS PCT: 2 %
HCT: 29.5 % — ABNORMAL LOW (ref 36.0–46.0)
Hemoglobin: 9.5 g/dL — ABNORMAL LOW (ref 12.0–15.0)
LYMPHS ABS: 1.4 10*3/uL (ref 0.7–4.0)
Lymphocytes Relative: 15 %
MCH: 26.6 pg (ref 26.0–34.0)
MCHC: 32.2 g/dL (ref 30.0–36.0)
MCV: 82.6 fL (ref 78.0–100.0)
MONO ABS: 0.8 10*3/uL (ref 0.1–1.0)
MONOS PCT: 8 %
Neutro Abs: 7.1 10*3/uL (ref 1.7–7.7)
Neutrophils Relative %: 75 %
Platelets: 184 10*3/uL (ref 150–400)
RBC: 3.57 MIL/uL — ABNORMAL LOW (ref 3.87–5.11)
RDW: 14.8 % (ref 11.5–15.5)
WBC: 9.4 10*3/uL (ref 4.0–10.5)

## 2016-10-12 LAB — BASIC METABOLIC PANEL
Anion gap: 8 (ref 5–15)
BUN: 35 mg/dL — AB (ref 6–20)
CALCIUM: 9.4 mg/dL (ref 8.9–10.3)
CHLORIDE: 101 mmol/L (ref 101–111)
CO2: 23 mmol/L (ref 22–32)
CREATININE: 2.13 mg/dL — AB (ref 0.44–1.00)
GFR calc non Af Amer: 21 mL/min — ABNORMAL LOW (ref >60)
GFR, EST AFRICAN AMERICAN: 24 mL/min — AB (ref >60)
Glucose, Bld: 88 mg/dL (ref 65–99)
Potassium: 4.1 mmol/L (ref 3.5–5.1)
SODIUM: 132 mmol/L — AB (ref 135–145)

## 2016-10-12 MED ORDER — SODIUM CHLORIDE 0.9 % IV BOLUS (SEPSIS)
500.0000 mL | Freq: Once | INTRAVENOUS | Status: AC
  Administered 2016-10-12: 500 mL via INTRAVENOUS

## 2016-10-12 NOTE — ED Notes (Signed)
Pt ambulated with light one assist. Clean catch urine obtained.

## 2016-10-12 NOTE — Discharge Instructions (Signed)
You were seen for a fall. Your workup is reassuring. You need to follow-up with your primary physician for recheck.

## 2016-10-18 ENCOUNTER — Ambulatory Visit (INDEPENDENT_AMBULATORY_CARE_PROVIDER_SITE_OTHER): Payer: Medicare Other | Admitting: Internal Medicine

## 2016-10-18 ENCOUNTER — Encounter: Payer: Self-pay | Admitting: Internal Medicine

## 2016-10-18 VITALS — BP 100/62 | HR 60 | Temp 98.1°F | Resp 12 | Ht 58.08 in | Wt 140.2 lb

## 2016-10-18 DIAGNOSIS — E2839 Other primary ovarian failure: Secondary | ICD-10-CM | POA: Diagnosis not present

## 2016-10-18 DIAGNOSIS — M25562 Pain in left knee: Secondary | ICD-10-CM | POA: Diagnosis not present

## 2016-10-18 DIAGNOSIS — D638 Anemia in other chronic diseases classified elsewhere: Secondary | ICD-10-CM | POA: Diagnosis not present

## 2016-10-18 DIAGNOSIS — I872 Venous insufficiency (chronic) (peripheral): Secondary | ICD-10-CM

## 2016-10-18 DIAGNOSIS — I10 Essential (primary) hypertension: Secondary | ICD-10-CM | POA: Diagnosis not present

## 2016-10-18 DIAGNOSIS — F22 Delusional disorders: Secondary | ICD-10-CM | POA: Diagnosis not present

## 2016-10-18 DIAGNOSIS — M8580 Other specified disorders of bone density and structure, unspecified site: Secondary | ICD-10-CM | POA: Diagnosis not present

## 2016-10-18 DIAGNOSIS — W19XXXA Unspecified fall, initial encounter: Secondary | ICD-10-CM

## 2016-10-18 DIAGNOSIS — J449 Chronic obstructive pulmonary disease, unspecified: Secondary | ICD-10-CM | POA: Diagnosis not present

## 2016-10-18 DIAGNOSIS — N183 Chronic kidney disease, stage 3 unspecified: Secondary | ICD-10-CM

## 2016-10-18 DIAGNOSIS — Y92009 Unspecified place in unspecified non-institutional (private) residence as the place of occurrence of the external cause: Secondary | ICD-10-CM

## 2016-10-18 DIAGNOSIS — Y92099 Unspecified place in other non-institutional residence as the place of occurrence of the external cause: Secondary | ICD-10-CM | POA: Diagnosis not present

## 2016-10-18 DIAGNOSIS — I5022 Chronic systolic (congestive) heart failure: Secondary | ICD-10-CM | POA: Diagnosis not present

## 2016-10-18 DIAGNOSIS — J4489 Other specified chronic obstructive pulmonary disease: Secondary | ICD-10-CM

## 2016-10-18 MED ORDER — OLANZAPINE 2.5 MG PO TABS: 2.5000 mg | ORAL_TABLET | Freq: Every day | ORAL | 3 refills | Status: DC

## 2016-10-18 MED ORDER — METHYLPREDNISOLONE ACETATE 40 MG/ML IJ SUSP
40.0000 mg | Freq: Once | INTRAMUSCULAR | Status: AC
  Administered 2016-10-18: 40 mg via INTRAMUSCULAR

## 2016-10-18 NOTE — Progress Notes (Signed)
Patient ID: [name], female   DOB: 12-24-1931, 82 y.o.   MRN: [id number]    Location:  [facility] Place of Service: OFFICE    Advanced Directive information    Chief Complaint  Patient presents with  . Establish Care    New patient establish care, here with sister [name]. Patient c/o knot on left side of head possibly related to a fall. + fall risk (patient states she fell twice within the last week, her step-son that is a ghost kicked her.)   . Orders    Bone Density     HPI:  82 yo female seen today as a new pt. She recently had a fall at home and now has a knot on her head and left knee swelling. She has visual hallucinations that her stepson [name] and grand stepson are kicking her. They are ghosts. She does have 2 adult stepsons ([name] - the oldest; [name] -the youngest) who are alive and well. According to sister, [name], the youngest stepson,[name], has caused significant problems including trying to get her and her late husband, [name], committed to mental institution. The oldest stepson calls frequently to check on pt. She currently lives in her home with sister, [name]. She has never rec'd tx for hallucinations. She takes xanax prn.  HTN - BP stable on coreg, hydralazine.  Chronic systolic HF - stable on coreg, torsemide + potassium and hydralazine. Followed by cardio Dr Daneen Schick. EF 35-40% by 2D echo in 2015. echo also revealed septal wall akinesis/severe hypokinesis; mod-sev diffuse hypokinesis.  Asthmatic bronchitis/chronic obstructive bronchitis- stable on advair, theo-24, proair HFA  Peripheral neuropathy - stable on gabapentin  Venous insufficiency/chronic LE lymphedema - stable on torsemide, clotrimazole/betamethasone cream, silvadene cream  CKD - stage 4. Cr 2.13  Anemia of chronic disease/MGUS - stable on iron supplement. Hgb 9.5; IgG 2600/IgA 408 in Apr 2017  Psychosis/ hallucinations - no behavioral disturbance and no formal psych evaluation. Family not willing for  counseling at this time. She lives alone and gets meals on wheels. family stays with her at night most days of the week and she gets some PCS services  Chronic LBP  - stable  Past Medical History:  Diagnosis Date  . Anemia, unspecified   . Anxiety   . Arthritis   . Asthma   . Delusions ([place])   . Depression   . Edema 2005   "since put the glass in them"   . GERD (gastroesophageal reflux disease)   . Lumbago   . Lymphedema   . Obstructive chronic bronchitis without exacerbation ([place])   . Other abnormal glucose   . Systolic heart failure (HCC)    EF 35 to 40% per echo January 2015  . Unspecified essential hypertension   . Unspecified nonpsychotic mental disorder   . Unspecified venous (peripheral) insufficiency   . Unspecified venous (peripheral) insufficiency   . Unspecified vitamin D deficiency     Past Surgical History:  Procedure Laterality Date  . APPENDECTOMY    . CERVICAL SPINE SURGERY     x 2  . CHOLECYSTECTOMY    . ESOPHAGOGASTRODUODENOSCOPY N/A 04/05/2015   Procedure: ESOPHAGOGASTRODUODENOSCOPY (EGD);  Surgeon: [name], MD;  Location: [facility];  Service: Endoscopy;  Laterality: N/A;  . FLEXIBLE SIGMOIDOSCOPY N/A 04/05/2015   Procedure: FLEXIBLE SIGMOIDOSCOPY;  Surgeon: [name], MD;  Location: [facility];  Service: Endoscopy;  Laterality: N/A;  . HAND SURGERY    . LUMBAR DISC SURGERY    . PARTIAL  HYSTERECTOMY    . TONSILLECTOMY      Patient Care Team: Gildardo Cranker, DO as PCP - General (Internal Medicine) [name], MD (Pulmonary Disease) Jacolyn Reedy, MD as Consulting Physician (Cardiology) Pieter Partridge, DO as Consulting Physician (Neurology)  Social History   Social History  . Marital status: Widowed    Spouse name: N/A  . Number of children: 0  . Years of education: N/A   Occupational History  . retired    Social History Main Topics  . Smoking status: Never Smoker  . Smokeless tobacco: Never Used  . Alcohol use No  .  Drug use: No  . Sexual activity: Not Currently   Other Topics Concern  . Not on file   Social History Narrative   Diet? Good, low appetite      Do you drink/eat things with caffeine? no      Marital status?                  widowed                  What year were you married?      Do you live in a house, apartment, assisted living, condo, trailer, etc.? house      Is it one or more stories? 1 story      How many persons live in your home? Only me      Do you have any pets in your home? (please list) no      Current or past profession: lab tech      Do you exercise?        some                              Type & how often? Foot rolls, some walking      Do you have a living will? yes      Do you have a DNR form?    no                              If not, do you want to discuss one?      Do you have signed POA/HPOA for forms? yes        reports that she has never smoked. She has never used smokeless tobacco. She reports that she does not drink alcohol or use drugs.  Family History  Problem Relation Age of Onset  . Diabetes Mother   . Arthritis Father   . Diabetes Paternal Grandfather     entire family on both sides  . Heart disease Maternal Aunt     entire family of both sides  . Cancer Paternal Aunt     type unknown  . Diabetes Sister    Family Status  Relation Status  . Mother Deceased  . Father Deceased  . Maternal Grandmother Deceased  . Maternal Grandfather Deceased  . Paternal Grandmother Deceased  . Paternal Grandfather Deceased  . Maternal Aunt   . Paternal Aunt   . Sister Alive  . Brother Alive  . Sister Alive  . Brother Alive  . Sister Deceased    Immunization History  Administered Date(s) Administered  . Influenza Split 07/31/2011  . Influenza Whole 09/30/2009, 07/30/2010  . Influenza,inj,Quad PF,36+ Mos 08/13/2015  . Influenza-Unspecified 07/30/2016  . Tdap 04/07/2015  . Varicella 08/25/2011  Allergies  Allergen Reactions  .  Aspirin Other (See Comments)    REACTION: upset stomach  . Codeine Other (See Comments)    REACTION: "MAKES MY BODY GO CRAZY; CRAMPS"  . Latex Nausea And Vomiting    Medications: Patient's Medications  New Prescriptions   No medications on file  Previous Medications   ALBUTEROL (PROAIR HFA) 108 (90 BASE) MCG/ACT INHALER    INHALE 2 PUFFS EVERY 4 HOURS AS NEEDED FOR WHEEZING   ALPRAZOLAM (XANAX) 0.5 MG TABLET    Take 1/2 to 1 tablet by mouth three times daily as needed for nerves   ASCORBIC ACID (VITAMIN C) 1000 MG TABLET    Take 2,000 mg by mouth daily.   CARVEDILOL (COREG) 6.25 MG TABLET    TAKE 1 TABLET BY MOUTH TWICE DAILY   CLOTRIMAZOLE-BETAMETHASONE (LOTRISONE) CREAM    Apply 1 application topically 2 (two) times daily. To her feet   FERROUS SULFATE DRIED 200 (65 FE) MG TABS    Take 1 tablet by mouth 2 (two) times daily.   FLUTICASONE-SALMETEROL (ADVAIR HFA) 115-21 MCG/ACT INHALER    USE 2 INHALATIONS TWICE A DAY   GABAPENTIN (NEURONTIN) 100 MG CAPSULE    Take 2 capsules (200 mg total) by mouth at bedtime.   HYDRALAZINE (APRESOLINE) 25 MG TABLET    Take 25 mg by mouth 2 (two) times daily.   POTASSIUM CHLORIDE SA (K-DUR,KLOR-CON) 20 MEQ TABLET    Take 2 tablets (40 mEq total) by mouth once.   SILVER SULFADIAZINE (SILVADENE) 1 % CREAM    Apply 1 application topically daily.   THEO-24 400 MG 24 HR CAPSULE    TAKE 1 CAPSULE DAILY   TORSEMIDE (DEMADEX) 20 MG TABLET    TAKE 3 TABLETS TWICE A DAY   TRAMADOL (ULTRAM) 50 MG TABLET    Take 1 tablet (50 mg total) by mouth 3 (three) times daily as needed.   TRIAMCINOLONE CREAM (KENALOG) 0.1 %    Apply 1 application topically 2 (two) times daily as needed.   VITAMIN D, ERGOCALCIFEROL, (DRISDOL) 50000 UNITS CAPS CAPSULE    Take 50,000 Units by mouth every 7 (seven) days.  Modified Medications   No medications on file  Discontinued Medications   No medications on file    Review of Systems  Constitutional: Positive for appetite change (loss),  chills and fever. Negative for diaphoresis.  HENT: Positive for dental problem (wears partial dentures).   Eyes: Positive for itching and visual disturbance.  Respiratory: Positive for shortness of breath.   Cardiovascular: Positive for chest pain.  Gastrointestinal: Positive for abdominal pain, diarrhea, nausea and vomiting.  Endocrine: Positive for cold intolerance.  Genitourinary: Positive for frequency and urgency.  Musculoskeletal: Positive for arthralgias, back pain and joint swelling.  Skin: Positive for color change.       Itchy; dry;painful areas  Neurological: Positive for dizziness and headaches.  Hematological:       Hx blood transfusions  Psychiatric/Behavioral: Positive for hallucinations. The patient is nervous/anxious.   All other systems reviewed and are negative. as taken from new pt packet  Vitals:   10/18/16 0841  BP: 100/62  Pulse: 60  Resp: 12  Temp: 98.1 F (36.7 C)  TempSrc: Oral  SpO2: 97%  Weight: 140 lb 3.2 oz (63.6 kg)  Height: 4' 10.08" (1.475 m)   Body mass index is 29.23 kg/m.  Physical Exam  Constitutional: She appears well-developed and well-nourished.  HENT:  Head: Head is with contusion.  Mouth/Throat: Oropharynx is clear and moist. No oropharyngeal exudate.  Eyes: Pupils are equal, round, and reactive to light. No scleral icterus.  Neck: Neck supple. Carotid bruit is not present. No tracheal deviation present. No thyromegaly present.  Cardiovascular: Normal rate, regular rhythm, normal heart sounds and intact distal pulses.  Exam reveals no gallop and no friction rub.   No murmur heard. L>R +2 pitting LE edema with lymphedema; no calf TTP  Pulmonary/Chest: Effort normal and breath sounds normal. No stridor. No respiratory distress. She has no wheezes. She has no rales.  Abdominal: Soft. Bowel sounds are normal. She exhibits no distension and no mass. There is no hepatomegaly. There is tenderness (epigastric). There is no rebound and  no guarding.  Musculoskeletal: She exhibits edema, tenderness (left knee with reduced ROM and marked swelling; popliteal fossa TTP) and deformity.  Lymphadenopathy:    She has no cervical adenopathy.  Neurological: She is alert. Gait (antalgic) abnormal.  Skin: Skin is warm and dry. No rash noted.  Left hand contusion and swelling  Psychiatric: She has a normal mood and affect. Her speech is normal and behavior is normal. She is not actively hallucinating. Thought content is paranoid and delusional.     Labs reviewed: Admission on 10/11/2016, Discharged on 10/12/2016  Component Date Value Ref Range Status  . WBC 10/12/2016 9.4  4.0 - 10.5 K/uL Final  . RBC 10/12/2016 3.57* 3.87 - 5.11 MIL/uL Final  . Hemoglobin 10/12/2016 9.5* 12.0 - 15.0 g/dL Final  . HCT 10/12/2016 29.5* 36.0 - 46.0 % Final  . MCV 10/12/2016 82.6  78.0 - 100.0 fL Final  . MCH 10/12/2016 26.6  26.0 - 34.0 pg Final  . MCHC 10/12/2016 32.2  30.0 - 36.0 g/dL Final  . RDW 10/12/2016 14.8  11.5 - 15.5 % Final  . Platelets 10/12/2016 184  150 - 400 K/uL Final  . Neutrophils Relative % 10/12/2016 75  % Final  . Neutro Abs 10/12/2016 7.1  1.7 - 7.7 K/uL Final  . Lymphocytes Relative 10/12/2016 15  % Final  . Lymphs Abs 10/12/2016 1.4  0.7 - 4.0 K/uL Final  . Monocytes Relative 10/12/2016 8  % Final  . Monocytes Absolute 10/12/2016 0.8  0.1 - 1.0 K/uL Final  . Eosinophils Relative 10/12/2016 2  % Final  . Eosinophils Absolute 10/12/2016 0.1  0.0 - 0.7 K/uL Final  . Basophils Relative 10/12/2016 0  % Final  . Basophils Absolute 10/12/2016 0.0  0.0 - 0.1 K/uL Final  . Sodium 10/12/2016 132* 135 - 145 mmol/L Final  . Potassium 10/12/2016 4.1  3.5 - 5.1 mmol/L Final  . Chloride 10/12/2016 101  101 - 111 mmol/L Final  . CO2 10/12/2016 23  22 - 32 mmol/L Final  . Glucose, Bld 10/12/2016 88  65 - 99 mg/dL Final  . BUN 10/12/2016 35* 6 - 20 mg/dL Final  . Creatinine, Ser 10/12/2016 2.13* 0.44 - 1.00 mg/dL Final  . Calcium  10/12/2016 9.4  8.9 - 10.3 mg/dL Final  . GFR calc non Af Amer 10/12/2016 21* >60 mL/min Final  . GFR calc Af Amer 10/12/2016 24* >60 mL/min Final   Comment: (NOTE) The eGFR has been calculated using the CKD EPI equation. This calculation has not been validated in all clinical situations. eGFR's persistently <60 mL/min signify possible Chronic Kidney Disease.   . Anion gap 10/12/2016 8  5 - 15 Final  . Opiates 10/12/2016 NONE DETECTED  NONE DETECTED Final  . Cocaine 10/12/2016 NONE DETECTED  NONE DETECTED Final  . Benzodiazepines 10/12/2016 POSITIVE* NONE DETECTED Final  . Amphetamines 10/12/2016 NONE DETECTED  NONE DETECTED Final  . Tetrahydrocannabinol 10/12/2016 NONE DETECTED  NONE DETECTED Final  . Barbiturates 10/12/2016 NONE DETECTED  NONE DETECTED Final   Comment:        DRUG SCREEN FOR MEDICAL PURPOSES ONLY.  IF CONFIRMATION IS NEEDED FOR ANY PURPOSE, NOTIFY LAB WITHIN 5 DAYS.        LOWEST DETECTABLE LIMITS FOR URINE DRUG SCREEN Drug Class       Cutoff (ng/mL) Amphetamine      1000 Barbiturate      200 Benzodiazepine   045 Tricyclics       409 Opiates          300 Cocaine          300 THC              50   . Color, Urine 10/12/2016 YELLOW  YELLOW Final  . APPearance 10/12/2016 CLEAR  CLEAR Final  . Specific Gravity, Urine 10/12/2016 1.005  1.005 - 1.030 Final  . pH 10/12/2016 6.0  5.0 - 8.0 Final  . Glucose, UA 10/12/2016 NEGATIVE  NEGATIVE mg/dL Final  . Hgb urine dipstick 10/12/2016 NEGATIVE  NEGATIVE Final  . Bilirubin Urine 10/12/2016 NEGATIVE  NEGATIVE Final  . Ketones, ur 10/12/2016 NEGATIVE  NEGATIVE mg/dL Final  . Protein, ur 10/12/2016 NEGATIVE  NEGATIVE mg/dL Final  . Nitrite 10/12/2016 NEGATIVE  NEGATIVE Final  . Leukocytes, UA 10/12/2016 TRACE* NEGATIVE Final  . RBC / HPF 10/12/2016 0-5  0 - 5 RBC/hpf Final  . WBC, UA 10/12/2016 0-5  0 - 5 WBC/hpf Final  . Bacteria, UA 10/12/2016 RARE* NONE SEEN Final  . Squamous Epithelial / LPF 10/12/2016 0-5*  NONE SEEN Final  . Mucous 10/12/2016 PRESENT   Final    Ct Head Wo Contrast  Result Date: 10/12/2016 CLINICAL DATA:  82 year old female with fall. EXAM: CT HEAD WITHOUT CONTRAST CT CERVICAL SPINE WITHOUT CONTRAST TECHNIQUE: Multidetector CT imaging of the head and cervical spine was performed following the standard protocol without intravenous contrast. Multiplanar CT image reconstructions of the cervical spine were also generated. COMPARISON:  Brain MRI dated 06/21/2015 FINDINGS: CT HEAD FINDINGS Brain: There is mild age-related atrophy and chronic microvascular ischemic changes. There is no acute intracranial hemorrhage. No mass effect or midline shift noted. No extra-axial fluid collection. Vascular: No hyperdense vessel or unexpected calcification. Skull: Normal. Negative for fracture or focal lesion. Sinuses/Orbits: No acute finding. Other: None CT CERVICAL SPINE FINDINGS Alignment: No acute subluxation. Skull base and vertebrae: No acute fracture. No primary bone lesion or focal pathologic process. There is advanced osteopenia. Soft tissues and spinal canal: No prevertebral fluid or swelling. No visible canal hematoma. Disc levels: There is fusion of the C4-C7. There is endplate irregularity at C2-C3 and C3-C4 with disc space narrowing. Anterior osteophyte noted at C2-3 and C3-C4. Upper chest: Negative. Other: There is mild prominence and heterogeneity of the thyroid gland with small scattered calcific densities. Correlation with clinical exam recommended. Ultrasound may provide better evaluation. IMPRESSION: No acute intracranial hemorrhage. No acute/ traumatic cervical spine pathology. Advanced osteopenia of the cervical spine with prior fusion of the C4-C7 vertebra. Electronically Signed   By: Anner Crete M.D.   On: 10/12/2016 00:26   Ct Cervical Spine Wo Contrast  Result Date: 10/12/2016 CLINICAL DATA:  82 year old female with fall. EXAM: CT HEAD WITHOUT CONTRAST CT CERVICAL SPINE WITHOUT  CONTRAST TECHNIQUE: Multidetector CT  imaging of the head and cervical spine was performed following the standard protocol without intravenous contrast. Multiplanar CT image reconstructions of the cervical spine were also generated. COMPARISON:  Brain MRI dated 06/21/2015 FINDINGS: CT HEAD FINDINGS Brain: There is mild age-related atrophy and chronic microvascular ischemic changes. There is no acute intracranial hemorrhage. No mass effect or midline shift noted. No extra-axial fluid collection. Vascular: No hyperdense vessel or unexpected calcification. Skull: Normal. Negative for fracture or focal lesion. Sinuses/Orbits: No acute finding. Other: None CT CERVICAL SPINE FINDINGS Alignment: No acute subluxation. Skull base and vertebrae: No acute fracture. No primary bone lesion or focal pathologic process. There is advanced osteopenia. Soft tissues and spinal canal: No prevertebral fluid or swelling. No visible canal hematoma. Disc levels: There is fusion of the C4-C7. There is endplate irregularity at C2-C3 and C3-C4 with disc space narrowing. Anterior osteophyte noted at C2-3 and C3-C4. Upper chest: Negative. Other: There is mild prominence and heterogeneity of the thyroid gland with small scattered calcific densities. Correlation with clinical exam recommended. Ultrasound may provide better evaluation. IMPRESSION: No acute intracranial hemorrhage. No acute/ traumatic cervical spine pathology. Advanced osteopenia of the cervical spine with prior fusion of the C4-C7 vertebra. Electronically Signed   By: Anner Crete M.D.   On: 10/12/2016 00:26   Dg Knee Complete 4 Views Left  Result Date: 10/12/2016 CLINICAL DATA:  82 year old female with fall and trauma to the left knee. EXAM: LEFT KNEE - COMPLETE 4+ VIEW COMPARISON:  Left knee radiograph dated 04/03/2015 FINDINGS: No definite acute fracture. Minimal depressed appearance of the medial tibial plateau likely chronic and projectional. There is no dislocation.  The bones are osteopenic. There is meniscal chondrocalcinosis. No significant joint effusion. The soft tissues appear unremarkable. IMPRESSION: No acute fracture or dislocation. Electronically Signed   By: Anner Crete M.D.   On: 10/12/2016 03:04     Assessment/Plan   ICD-9-CM ICD-10-CM   1. Paranoid delusion ([place]) 297.9 F22 OLANZapine (ZYPREXA) 2.5 MG tablet  2. Fall in home, initial encounter now with contusion to left face, hand and knee E888.9 W19.XXXA    E849.0 Y92.099   3. Venous (peripheral) insufficiency 459.81 I87.2 CMP with eGFR  4. Chronic systolic heart failure (HCC) 428.22 I50.22 Lipid Panel     TSH  5. Obstructive chronic bronchitis (HCC) 491.20 J44.9   6. Essential hypertension 401.9 I10   7. CKD (chronic kidney disease) stage 3, GFR 30-59 ml/min 585.3 N18.3   8. Estrogen deficiency 256.39 E28.39 DG Bone Density  9. Osteopenia, unspecified location 733.90 M85.80   10. Anemia of chronic disease 285.29 D63.8 CBC with Differential/Platelets     TSH  11. Acute pain of left knee 719.46 M25.562 methylPREDNISolone acetate (DEPO-MEDROL) injection 40 mg    Start zyprexa at bedtime to reduce seeing and hearing 68  Get old records  Continue other medications as ordered  Follow up in 1 month for AWV and CPE  Fasting labs prior to appt  Apply cool compresses as needed to bruised areas.  Depo-medrol 20m injection given today  Follow up with specialists as scheduled  Shayan Bramhall S. CPerlie Gold PWestgreen Surgical Centerand Adult Medicine [address] [phone] (Monday-Friday 8 AM - 5 PM) [phone] 5 PM and follow prompts

## 2016-10-18 NOTE — Patient Instructions (Signed)
Start zyprexa at bedtime to reduce seeing and hearing [name]  Continue other medications as ordered  Follow up in 1 month for AWV and CPE  Fasting labs prior to appt  Apply cool compresses as needed to bruised areas.  Depo-medrol 40mg  injection given today  Follow up with specialists as scheduled

## 2016-10-26 ENCOUNTER — Ambulatory Visit: Payer: Medicare Other | Admitting: Neurology

## 2016-11-03 ENCOUNTER — Encounter: Payer: Self-pay | Admitting: Neurology

## 2016-11-21 ENCOUNTER — Ambulatory Visit: Payer: Medicare Other | Admitting: Podiatry

## 2016-11-22 ENCOUNTER — Other Ambulatory Visit: Payer: Medicare Other

## 2016-11-24 ENCOUNTER — Ambulatory Visit: Payer: Self-pay | Admitting: Internal Medicine

## 2016-11-24 ENCOUNTER — Ambulatory Visit (INDEPENDENT_AMBULATORY_CARE_PROVIDER_SITE_OTHER): Payer: Medicare Other

## 2016-11-24 ENCOUNTER — Other Ambulatory Visit: Payer: Medicare Other

## 2016-11-24 ENCOUNTER — Ambulatory Visit: Payer: Medicare Other

## 2016-11-24 VITALS — BP 146/80 | HR 82 | Temp 98.3°F | Ht <= 58 in | Wt 142.2 lb

## 2016-11-24 DIAGNOSIS — Z23 Encounter for immunization: Secondary | ICD-10-CM | POA: Diagnosis not present

## 2016-11-24 DIAGNOSIS — Z0000 Encounter for general adult medical examination without abnormal findings: Secondary | ICD-10-CM | POA: Diagnosis not present

## 2016-11-24 DIAGNOSIS — D638 Anemia in other chronic diseases classified elsewhere: Secondary | ICD-10-CM

## 2016-11-24 DIAGNOSIS — I872 Venous insufficiency (chronic) (peripheral): Secondary | ICD-10-CM

## 2016-11-24 DIAGNOSIS — I5022 Chronic systolic (congestive) heart failure: Secondary | ICD-10-CM | POA: Diagnosis not present

## 2016-11-24 LAB — CBC WITH DIFFERENTIAL/PLATELET
BASOS ABS: 0 {cells}/uL (ref 0–200)
Basophils Relative: 0 %
EOS PCT: 0 %
Eosinophils Absolute: 0 cells/uL — ABNORMAL LOW (ref 15–500)
HCT: 33.8 % — ABNORMAL LOW (ref 35.0–45.0)
HEMOGLOBIN: 10.8 g/dL — AB (ref 11.7–15.5)
LYMPHS ABS: 1232 {cells}/uL (ref 850–3900)
LYMPHS PCT: 22 %
MCH: 26.9 pg — ABNORMAL LOW (ref 27.0–33.0)
MCHC: 32 g/dL (ref 32.0–36.0)
MCV: 84.3 fL (ref 80.0–100.0)
MPV: 8.8 fL (ref 7.5–12.5)
Monocytes Absolute: 392 cells/uL (ref 200–950)
Monocytes Relative: 7 %
Neutro Abs: 3976 cells/uL (ref 1500–7800)
Neutrophils Relative %: 71 %
Platelets: 264 10*3/uL (ref 140–400)
RBC: 4.01 MIL/uL (ref 3.80–5.10)
RDW: 15.7 % — ABNORMAL HIGH (ref 11.0–15.0)
WBC: 5.6 10*3/uL (ref 3.8–10.8)

## 2016-11-24 LAB — TSH: TSH: 1.11 mIU/L
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
## 2016-11-24 NOTE — Patient Instructions (Addendum)
[name] , Thank you for taking time to come for your Medicare Wellness Visit. I appreciate your ongoing commitment to your health goals. Please review the following plan we discussed and let me know if I can assist you in the future.   These are the goals we discussed: Goals    . <enter goal here>          Starting 11/24/16, I will maintain my current lifestyle.        This is a list of the screening recommended for you and due dates:  Health Maintenance  Topic Date Due  . OT PLAN OF CARE  May 06, 1932  . DEXA scan (bone density measurement)  03/18/1999  . Colon Cancer Screening  08/25/2015  . Shingles Vaccine  10/31/2019*  . Pneumonia vaccines (2 of 2 - PPSV23) 11/24/2017  . Tetanus Vaccine  04/06/2025  . Flu Shot  Completed  *Topic was postponed. The date shown is not the original due date.  Preventive Care for Adults  A healthy lifestyle and preventive care can promote health and wellness. Preventive health guidelines for adults include the following key practices.  . A routine yearly physical is a good way to check with your health care provider about your health and preventive screening. It is a chance to share any concerns and updates on your health and to receive a thorough exam.  . Visit your dentist for a routine exam and preventive care every 6 months. Brush your teeth twice a day and floss once a day. Good oral hygiene prevents tooth decay and gum disease.  . The frequency of eye exams is based on your age, health, family medical history, use  of contact lenses, and other factors. Follow your health care provider's ecommendations for frequency of eye exams.  . Eat a healthy diet. Foods like vegetables, fruits, whole grains, low-fat dairy products, and lean protein foods contain the nutrients you need without too many calories. Decrease your intake of foods high in solid fats, added sugars, and salt. Eat the right amount of calories for you. Get information about a proper  diet from your health care provider, if necessary.  . Regular physical exercise is one of the most important things you can do for your health. Most adults should get at least 150 minutes of moderate-intensity exercise (any activity that increases your heart rate and causes you to sweat) each week. In addition, most adults need muscle-strengthening exercises on 2 or more days a week.  Silver Sneakers may be a benefit available to you. To determine eligibility, you may visit the website: www.silversneakers.com or contact program at 805-387-5089 Mon-Fri between 8AM-8PM.   . Maintain a healthy weight. The body mass index (BMI) is a screening tool to identify possible weight problems. It provides an estimate of body fat based on height and weight. Your health care provider can find your BMI and can help you achieve or maintain a healthy weight.   For adults 20 years and older: ? A BMI below 18.5 is considered underweight. ? A BMI of 18.5 to 24.9 is normal. ? A BMI of 25 to 29.9 is considered overweight. ? A BMI of 30 and above is considered obese.   . Maintain normal blood lipids and cholesterol levels by exercising and minimizing your intake of saturated fat. Eat a balanced diet with plenty of fruit and vegetables. Blood tests for lipids and cholesterol should begin at age 55 and be repeated every 5 years. If your lipid or  cholesterol levels are high, you are over 50, or you are at high risk for heart disease, you may need your cholesterol levels checked more frequently. Ongoing high lipid and cholesterol levels should be treated with medicines if diet and exercise are not working.  . If you smoke, find out from your health care provider how to quit. If you do not use tobacco, please do not start.  . If you choose to drink alcohol, please do not consume more than 2 drinks per day. One drink is considered to be 12 ounces (355 mL) of beer, 5 ounces (148 mL) of wine, or 1.5 ounces (44 mL) of  liquor.  . If you are 21-40 years old, ask your health care provider if you should take aspirin to prevent strokes.  . Use sunscreen. Apply sunscreen liberally and repeatedly throughout the day. You should seek shade when your shadow is shorter than you. Protect yourself by wearing long sleeves, pants, a wide-brimmed hat, and sunglasses year round, whenever you are outdoors.  . Once a month, do a whole body skin exam, using a mirror to look at the skin on your back. Tell your health care provider of new moles, moles that have irregular borders, moles that are larger than a pencil eraser, or moles that have changed in shape or color.

## 2016-11-25 LAB — LIPID PANEL
CHOLESTEROL: 207 mg/dL — AB (ref <200)
HDL: 81 mg/dL (ref >50)
LDL CALC: 111 mg/dL — AB (ref <100)
Total CHOL/HDL Ratio: 2.6 Ratio (ref <5.0)
Triglycerides: 73 mg/dL (ref <150)
VLDL: 15 mg/dL (ref <30)

## 2016-11-25 LAB — COMPLETE METABOLIC PANEL WITH GFR
ALBUMIN: 3.9 g/dL (ref 3.6–5.1)
ALK PHOS: 73 U/L (ref 33–130)
ALT: 13 U/L (ref 6–29)
AST: 21 U/L (ref 10–35)
BILIRUBIN TOTAL: 0.5 mg/dL (ref 0.2–1.2)
BUN: 37 mg/dL — AB (ref 7–25)
CO2: 22 mmol/L (ref 20–31)
CREATININE: 1.45 mg/dL — AB (ref 0.60–0.88)
Calcium: 10 mg/dL (ref 8.6–10.4)
Chloride: 102 mmol/L (ref 98–110)
GFR, Est African American: 39 mL/min — ABNORMAL LOW (ref >=60)
GFR, Est Non African American: 34 mL/min — ABNORMAL LOW (ref >=60)
GLUCOSE: 100 mg/dL — AB (ref 65–99)
POTASSIUM: 3.9 mmol/L (ref 3.5–5.3)
SODIUM: 139 mmol/L (ref 135–146)
TOTAL PROTEIN: 8.6 g/dL — AB (ref 6.1–8.1)

## 2016-12-01 ENCOUNTER — Ambulatory Visit: Payer: Medicare Other | Admitting: Internal Medicine

## 2016-12-12 ENCOUNTER — Ambulatory Visit: Payer: Medicare Other | Admitting: Pulmonary Disease

## 2016-12-13 ENCOUNTER — Telehealth: Payer: Self-pay | Admitting: Pulmonary Disease

## 2016-12-13 NOTE — Telephone Encounter (Signed)
Called by [name] who c/o N/V and diarrhea since yesterday. She denies abdominal pain, fever or chills. She requests anti-emetic Rx. She is not on anti-emetics chronically and review of her current medications reveals no anti-emetics. In addition, I am not sure that PO anti-emetics will be effective. Clinical picture c/w viral gastroenteritis. Recommended that she try Pepto bismol to see if that settled her GI tract. She is on chronic diuretic therapy and I am concerned that she may become dehydrated with poor PO intake and diuretic therapy. We will see if the Pepto bismol helps her. Instructed her to call the office for a follow up appointment in the AM should her N/V/Diarrhea persist. Further instructed her that should she have abdominal pain, fever or chills, she should go to the Emergency Department for further evaluation.

## 2016-12-18 ENCOUNTER — Encounter: Payer: Self-pay | Admitting: Internal Medicine

## 2016-12-18 ENCOUNTER — Ambulatory Visit: Payer: Medicare Other | Admitting: Neurology

## 2016-12-18 ENCOUNTER — Encounter: Payer: Self-pay | Admitting: *Deleted

## 2016-12-18 ENCOUNTER — Ambulatory Visit (INDEPENDENT_AMBULATORY_CARE_PROVIDER_SITE_OTHER): Payer: Medicare Other | Admitting: Internal Medicine

## 2016-12-18 ENCOUNTER — Telehealth: Payer: Self-pay | Admitting: Pulmonary Disease

## 2016-12-18 VITALS — BP 120/60 | HR 72 | Temp 98.2°F | Ht <= 58 in | Wt 147.0 lb

## 2016-12-18 DIAGNOSIS — F5089 Other specified eating disorder: Secondary | ICD-10-CM

## 2016-12-18 DIAGNOSIS — I890 Lymphedema, not elsewhere classified: Secondary | ICD-10-CM | POA: Diagnosis not present

## 2016-12-18 DIAGNOSIS — R110 Nausea: Secondary | ICD-10-CM

## 2016-12-18 DIAGNOSIS — F22 Delusional disorders: Secondary | ICD-10-CM

## 2016-12-18 MED ORDER — ONDANSETRON HCL 4 MG PO TABS: 4.0000 mg | ORAL_TABLET | Freq: Three times a day (TID) | ORAL | 0 refills | Status: DC | PRN

## 2016-12-18 MED ORDER — CLOTRIMAZOLE-BETAMETHASONE 1-0.05 % EX CREA: 1.0000 "application " | TOPICAL_CREAM | Freq: Two times a day (BID) | CUTANEOUS | 3 refills | Status: DC

## 2016-12-18 NOTE — Telephone Encounter (Signed)
Spoke with pt and I am unsure where the miscommunication was between her and the on call doctor. I informed the pt that we do not have an appointment today with SN today for her. She states she needs medication for nausea. I encouraged her to reach out to her PCP Dr. Gildardo Cranker to see if they are willing to prescribe anything for this as well as if they are willing to see her to discuss her diarrhea. She understood and stated she will reach out to her office. Nothing further is needed at this time.

## 2016-12-18 NOTE — Patient Instructions (Signed)
Make follow up appointment with the lymphedema center at [facility].
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
## 2016-12-19 ENCOUNTER — Ambulatory Visit (INDEPENDENT_AMBULATORY_CARE_PROVIDER_SITE_OTHER): Payer: Medicare Other | Admitting: Podiatry

## 2016-12-19 DIAGNOSIS — B351 Tinea unguium: Secondary | ICD-10-CM | POA: Diagnosis not present

## 2016-12-19 DIAGNOSIS — M79676 Pain in unspecified toe(s): Secondary | ICD-10-CM

## 2016-12-19 NOTE — Progress Notes (Signed)
Complaint:  Visit Type: Patient returns to my office for continued preventative foot care services. Complaint: Patient states" my nails have grown long and thick and become painful to walk and wear shoes" Patient has been diagnosed with DM with severe swelling both legs and feet. The patient presents for preventative foot care services. No changes to ROS  Podiatric Exam: Vascular: dorsalis pedis and posterior tibial pulses are not  palpable bilateral due to her severe swelling. Capillary return is immediate. Temperature gradient is WNL.  Sensorium: Normal Semmes Weinstein monofilament test. Normal tactile sensation bilaterally. Nail Exam: Pt has thick disfigured discolored nails with subungual debris noted bilateral entire nail hallux through fifth toenails Ulcer Exam: There is no evidence of ulcer or pre-ulcerative changes or infection. Orthopedic Exam: Muscle tone and strength are WNL. No limitations in general ROM. No crepitus or effusions noted. Foot type and digits show no abnormalities. Bony prominences are unremarkable. Severe swelling, almost elephantitis noted. Skin: No Porokeratosis. No infection or ulcers  Diagnosis:  Onychomycosis, , Pain in right toe, pain in left toes  Treatment & Plan Procedures and Treatment: Consent by patient was obtained for treatment procedures. The patient understood the discussion of treatment and procedures well. All questions were answered thoroughly reviewed. Debridement of mycotic and hypertrophic toenails, 1 through 5 bilateral and clearing of subungual debris. No ulceration, no infection noted.  Return Visit-Office Procedure: Patient instructed to return to the office for a follow up visit 3 months for continued evaluation and treatment.    Gardiner Barefoot DPM

## 2016-12-20 ENCOUNTER — Ambulatory Visit (INDEPENDENT_AMBULATORY_CARE_PROVIDER_SITE_OTHER): Payer: Medicare Other | Admitting: Neurology

## 2016-12-20 ENCOUNTER — Encounter: Payer: Self-pay | Admitting: Neurology

## 2016-12-20 VITALS — BP 124/84 | HR 84 | Ht <= 58 in | Wt 146.4 lb

## 2016-12-20 DIAGNOSIS — I890 Lymphedema, not elsewhere classified: Secondary | ICD-10-CM | POA: Diagnosis not present

## 2016-12-20 DIAGNOSIS — G44329 Chronic post-traumatic headache, not intractable: Secondary | ICD-10-CM

## 2016-12-20 MED ORDER — GABAPENTIN 100 MG PO CAPS
ORAL_CAPSULE | ORAL | 4 refills | Status: DC
Start: 2016-12-20 — End: 2017-04-26
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
## 2016-12-20 NOTE — Patient Instructions (Signed)
1.  We will increase the gabapentin.  Take 1 pill in the morning and 2 pills at bedtime.  If headaches are not better, contact me in 4 weeks and we can increase the dose. 2.  Follow up in 4 months.

## 2016-12-29 ENCOUNTER — Ambulatory Visit: Payer: Medicare Other | Admitting: Internal Medicine

## 2017-01-02 ENCOUNTER — Other Ambulatory Visit: Payer: Medicare Other

## 2017-01-24 ENCOUNTER — Encounter (HOSPITAL_COMMUNITY): Payer: Self-pay | Admitting: Emergency Medicine

## 2017-01-24 ENCOUNTER — Emergency Department (HOSPITAL_COMMUNITY): Payer: Medicare Other

## 2017-01-24 ENCOUNTER — Emergency Department (HOSPITAL_COMMUNITY)
Admission: EM | Admit: 2017-01-24 | Discharge: 2017-01-24 | Disposition: A | Discharge status: Home / Self Care | Payer: Medicare Other | Attending: Emergency Medicine | Admitting: Emergency Medicine

## 2017-01-24 DIAGNOSIS — M25462 Effusion, left knee: Secondary | ICD-10-CM | POA: Diagnosis not present

## 2017-01-24 DIAGNOSIS — Y999 Unspecified external cause status: Secondary | ICD-10-CM | POA: Insufficient documentation

## 2017-01-24 DIAGNOSIS — I5022 Chronic systolic (congestive) heart failure: Secondary | ICD-10-CM | POA: Insufficient documentation

## 2017-01-24 DIAGNOSIS — Y929 Unspecified place or not applicable: Secondary | ICD-10-CM | POA: Diagnosis not present

## 2017-01-24 DIAGNOSIS — N183 Chronic kidney disease, stage 3 (moderate): Secondary | ICD-10-CM | POA: Diagnosis not present

## 2017-01-24 DIAGNOSIS — I130 Hypertensive heart and chronic kidney disease with heart failure and stage 1 through stage 4 chronic kidney disease, or unspecified chronic kidney disease: Secondary | ICD-10-CM | POA: Insufficient documentation

## 2017-01-24 DIAGNOSIS — M7989 Other specified soft tissue disorders: Secondary | ICD-10-CM | POA: Diagnosis not present

## 2017-01-24 DIAGNOSIS — J449 Chronic obstructive pulmonary disease, unspecified: Secondary | ICD-10-CM | POA: Insufficient documentation

## 2017-01-24 DIAGNOSIS — W500XXA Accidental hit or strike by another person, initial encounter: Secondary | ICD-10-CM | POA: Diagnosis not present

## 2017-01-24 DIAGNOSIS — M25562 Pain in left knee: Secondary | ICD-10-CM | POA: Diagnosis present

## 2017-01-24 DIAGNOSIS — Z91040 Latex allergy status: Secondary | ICD-10-CM | POA: Diagnosis not present

## 2017-01-24 DIAGNOSIS — Y939 Activity, unspecified: Secondary | ICD-10-CM | POA: Diagnosis not present

## 2017-01-24 MED ORDER — ACETAMINOPHEN ER 650 MG PO TBCR: 650.0000 mg | EXTENDED_RELEASE_TABLET | Freq: Three times a day (TID) | ORAL | 0 refills | Status: DC | PRN

## 2017-01-24 NOTE — Discharge Instructions (Signed)
Please keep the leg elevated. Apply ice to the area 3-4 times a day. Be careful with walking and use the knee immobilizer for support.  See the Orthopedic doctor in 1-2 week.
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
## 2017-01-29 ENCOUNTER — Other Ambulatory Visit: Payer: Self-pay | Admitting: Interventional Cardiology

## 2017-01-30 ENCOUNTER — Telehealth: Payer: Self-pay | Admitting: Pulmonary Disease

## 2017-01-30 ENCOUNTER — Telehealth: Payer: Self-pay | Admitting: *Deleted

## 2017-01-30 DIAGNOSIS — I5022 Chronic systolic (congestive) heart failure: Secondary | ICD-10-CM

## 2017-01-30 MED ORDER — CARVEDILOL 6.25 MG PO TABS
ORAL_TABLET | ORAL | 1 refills | Status: DC
Start: 2017-01-30 — End: 2017-07-05

## 2017-01-30 NOTE — Telephone Encounter (Signed)
pt called asking if Dr [name] will fill her Carvedilol, it was filled by Dr Lenna Gilford last year, told her i will send message but to call Dr Brendolyn Patty office in the mean time if she needed medication

## 2017-01-30 NOTE — Telephone Encounter (Signed)
Filled by Dr. Jeannine Kitten office today

## 2017-01-30 NOTE — Telephone Encounter (Signed)
Pt requesting refill on carvedilol.  This has been sent.  Also requesting a rov.  This has been scheduled.  Nothing further needed.

## 2017-02-01 ENCOUNTER — Ambulatory Visit (INDEPENDENT_AMBULATORY_CARE_PROVIDER_SITE_OTHER)
Admission: RE | Admit: 2017-02-01 | Discharge: 2017-02-01 | Disposition: A | Discharge status: Home / Self Care | Payer: Medicare Other | Source: Ambulatory Visit | Attending: Pulmonary Disease | Admitting: Pulmonary Disease

## 2017-02-01 ENCOUNTER — Ambulatory Visit: Payer: Medicare Other | Admitting: Pulmonary Disease

## 2017-02-01 ENCOUNTER — Ambulatory Visit: Payer: Medicare Other | Admitting: Neurology

## 2017-02-01 ENCOUNTER — Encounter: Payer: Self-pay | Admitting: Pulmonary Disease

## 2017-02-01 VITALS — BP 118/70 | HR 76 | Temp 98.2°F | Ht <= 58 in | Wt 142.0 lb

## 2017-02-01 DIAGNOSIS — J4489 Other specified chronic obstructive pulmonary disease: Secondary | ICD-10-CM

## 2017-02-01 DIAGNOSIS — F22 Delusional disorders: Secondary | ICD-10-CM

## 2017-02-01 DIAGNOSIS — I5022 Chronic systolic (congestive) heart failure: Secondary | ICD-10-CM

## 2017-02-01 DIAGNOSIS — R609 Edema, unspecified: Secondary | ICD-10-CM

## 2017-02-01 DIAGNOSIS — I890 Lymphedema, not elsewhere classified: Secondary | ICD-10-CM

## 2017-02-01 DIAGNOSIS — D464 Refractory anemia, unspecified: Secondary | ICD-10-CM

## 2017-02-01 DIAGNOSIS — I872 Venous insufficiency (chronic) (peripheral): Secondary | ICD-10-CM

## 2017-02-01 DIAGNOSIS — N183 Chronic kidney disease, stage 3 unspecified: Secondary | ICD-10-CM

## 2017-02-01 DIAGNOSIS — I10 Essential (primary) hypertension: Secondary | ICD-10-CM

## 2017-02-01 DIAGNOSIS — J449 Chronic obstructive pulmonary disease, unspecified: Secondary | ICD-10-CM

## 2017-02-01 MED ORDER — FIRST-DUKES MOUTHWASH MT SUSP: OROMUCOSAL | 5 refills | Status: DC

## 2017-02-01 NOTE — Progress Notes (Signed)
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 tried repeatedly for her to go to [facility] or [facility] but she refuses... she has never threatened violence or suicide--- ~  9/09: she is requesting refill of ALPRAZOLAM 0.67m- 1/2 to 1 tab by mouth three times a day as needed for nerves, and HYDROXYZINE 233m 1 tab by mouth Qid as needed for itching... ~  Tried Risperdone 28m73mhs but apparently no better on this med at this dose... she refused Psychiatric help. ~  6/13:  She went to the ER w/ "pain all over" claiming again that it was from glass shards placed under her skin by neighbors; of course- no glass was found & XRays of legs/feet were neg for foreign bodies; severe chr ven insuffic & lymphedema confirmed; no change in Rx... ~   in Sept2013 she was brought to the ER by the police at request of the son who took out involuntary commitment papers on his mother- noting that she & her husb were incapable of caring for themselves, living in unsanitary conditions, not  taking meds, plus her longstanding delusional paranoid behavior;  ER physician easily identified her delusional state, noted her LE lymphedema, etc;  Labs showed Anemia, elev TProt & low Alb, & he was cleared for Psychiatric eval;  She was seen by DrJonnalagadda for Psyche (clearly he did not review any of my previous medical notes)- UNBELIEVABLY HE CONCLUDED THAT SHE HAD NO PSYCHOSIS & DID NOT MEET THE CRITERIA FOR PSYCHIATRIC HOSPITALIZATION, THEY RESCINDED THE PETITION FOR INVOLUNTARY COMMITMENT, NO MEDS GIVEN AND [facility] [facility] [place] [place] [facility])... ~  6/14:  She continues w/ her paranoid delusions & now states they are taking the enamel off her teeth, etc; she continues to refuse help; I met her husb for the 1st time today- he is elderly, appears chronically ill, has a speech impediment, & when asked for info just agrees with whatever his wife says including her delusions... ~  6/15:  No change in her psychiatric illness... ~  4/16:  Continued  stable/ unchanged...  VITAMIN D DEFICIENCY (ICD-268.9) ~  labs 4/11 showed Vit D level = 12... rec> start 5000 u Vit D OTC daily (it isn't clear if she ever took the supplement). ~  Labs 7/12 showed Vit D level = under10... Rec> start Rx 50K weekly from now on... ~  7/13:  Pt supposed to still be on VitD 50K weekly, but she didn't bring med bottles to the OV; rec to continue same... ~  6/14:  Labs on VitD50K weekly showed VitD= 53 ~  6/15:  Labs showed Vit D level = 77 on 50K weekly...   HEME FOLLOWED by DrShadad >>   ANEMIA & MGUS >> on FeSO4 one daily w/ VitC... ~  labs 3/08 showed Hg= 12.1, MCV= 84 ~  labs 9/09 showed Hg= 11.5, MCV= 85 ~  labs 4/11 showed Hg= 11.2, MCV= 85, Fe= 37 (12%sat), B12 >1500, Folate= 17... rec> FeSO4 + VitC daily. ~  Labs 7/12 showed Hg= 10.9, MCV= 84, Fe= 33 (10%sat); SPE w/ IgG kappa monoclonal gammopathy- accounts for 1.12g/dL of the total 3.10 g/dL of protein in the gamma  region (IgG & IgA are elev, and IgM is low norm)... ~  Labs 7/13 showed Hg= 10.4, Fe= 60 (19%sat);  SPE/IEP did NOT show a monoclonal paraprotein this time; but IgG & IgA levels were further elevated over last yrs numbers; we will check 24H urine for TProt/ creat clearance/ IEP==> ADDENDUM>> 24H Urine 7/13 showed polyclonal incr free kappa light chains, no monoclonal Bence-Jones protein, Total prot excretion= 138m/day, Cr clearance=54 ml/min. ~  Labs 6/14 showed Hg= 8.7, MCV= 80;  SPE/IEP did NOT show an M-spike, but IgG & IgA were further increased over the prev yrs & she is referred to Hematology for their consult=> she cancelled & never resched... ~  12/14: she has her niece here now to assume her care; we will resched the HEME appt... ~  She saw DrShadad for Heme 1/15 (note reviewed)>  Anemia & abn SPE/ IEP with steadily increasing IgG & IgA levels but no clear monoclonal peak; he has rec oral iron therapy & observation for now; last Hg was 1/15 = 10.6, up from 8.8..[name] ~  Labs 6/15 showed Hg= 10.6 on FeSO4 bid; she continues to f/u w/ DrShadad... ~  Labs 4/16 showed Hg=9.4, B12>1500 and she needs f/u DrShadad...   Past Surgical History:  Procedure Laterality Date  . APPENDECTOMY    . CERVICAL SPINE SURGERY     x 2  . CHOLECYSTECTOMY    . ESOPHAGOGASTRODUODENOSCOPY N/A 04/05/2015   Procedure: ESOPHAGOGASTRODUODENOSCOPY (EGD);  Surgeon: MLadene Artist MD;  [facility];  Service: Endoscopy;  Laterality: N/A;  . FLEXIBLE SIGMOIDOSCOPY N/A 04/05/2015   Procedure: FLEXIBLE SIGMOIDOSCOPY;  Surgeon: MLadene Artist MD;  Location: [facility];  Service: Endoscopy;  Laterality: N/A;  . HAND SURGERY    . LUMBAR DISC SURGERY    . PARTIAL HYSTERECTOMY    . TONSILLECTOMY      Outpatient Encounter Prescriptions as of 02/01/2017  Medication Sig  . acetaminophen (TYLENOL 8 HOUR) 650 MG CR tablet Take 1 tablet (650 mg total) by mouth every 8 (eight) hours as needed for pain.  .Marland Kitchenalbuterol  (PROAIR HFA) 108 (90 Base) MCG/ACT inhaler INHALE 2 PUFFS EVERY 4 HOURS AS NEEDED FOR WHEEZING  . ALPRAZolam (XANAX) 0.5 MG tablet Take 1/2 to 1 tablet by mouth three times daily as needed for nerves  . Ascorbic Acid (VITAMIN C) 1000 MG tablet Take  2,000 mg by mouth daily.  . carvedilol (COREG) 6.25 MG tablet TAKE 1 TABLET BY MOUTH TWICE DAILY  . clotrimazole-betamethasone (LOTRISONE) cream Apply 1 application topically 2 (two) times daily. To your legs  . Ferrous Sulfate Dried 200 (65 FE) MG TABS Take 1 tablet by mouth 2 (two) times daily.  . fluticasone-salmeterol (ADVAIR HFA) 115-21 MCG/ACT inhaler USE 2 INHALATIONS TWICE A DAY  . gabapentin (NEURONTIN) 100 MG capsule Take 1 capsule in morning and 2 capsules at bedtime.  . hydrALAZINE (APRESOLINE) 25 MG tablet Take 25 mg by mouth 2 (two) times daily.  [name] OLANZapine (ZYPREXA) 2.5 MG tablet Take 1 tablet (2.5 mg total) by mouth at bedtime.  . ondansetron (ZOFRAN) 4 MG tablet Take 1 tablet (4 mg total) by mouth every 8 (eight) hours as needed for nausea or vomiting.  . potassium chloride SA (K-DUR,KLOR-CON) 20 MEQ tablet Take 2 tablets (40 mEq total) by mouth once.  . silver sulfADIAZINE (SILVADENE) 1 % cream Apply 1 application topically daily.  [name] torsemide (DEMADEX) 20 MG tablet TAKE 3 TABLETS TWICE A DAY  . traMADol (ULTRAM) 50 MG tablet Take 1 tablet (50 mg total) by mouth 3 (three) times daily as needed.  . Vitamin D, Ergocalciferol, (DRISDOL) 50000 units CAPS capsule Take 50,000 Units by mouth every 7 (seven) days.  . Diphenhyd-Hydrocort-Nystatin (FIRST-DUKES MOUTHWASH) SUSP 1 tsp gargle and swallow three times daily as needed  . [DISCONTINUED] THEO-24 400 MG 24 hr capsule TAKE 1 CAPSULE DAILY (Patient not taking: Reported on 02/01/2017)   No facility-administered encounter medications on file as of 02/01/2017.     Allergies  Allergen Reactions  . Aspirin Other (See Comments)    REACTION: upset stomach  . Codeine Other (See Comments)     REACTION: "MAKES MY BODY GO CRAZY; CRAMPS"  . Latex Nausea And Vomiting    Current Medications, Allergies, Past Medical History, Past Surgical History, Family History, and Social History were reviewed in [employer] record.    Review of Systems        See HPI - all other systems neg except as noted...   The patient complains of dyspnea on exertion, peripheral edema, and difficulty walking.  The patient denies anorexia, fever, weight loss, weight gain, vision loss, decreased hearing, hoarseness, chest pain, syncope, prolonged cough, headaches, hemoptysis, abdominal pain, melena, hematochezia, severe indigestion/heartburn, hematuria, incontinence, muscle weakness, suspicious skin lesions, transient blindness, depression, unusual weight change, abnormal bleeding, enlarged lymph nodes, and angioedema.     Objective:   Physical Exam     WD, Overweight, 83 y/o BF in NAD... GENERAL:  Alert & oriented x 3... she has obvious paranoid delusions... HEENT:  [place]/AT, EOM-wnl, PERRLA, EACs-clear, TMs-wnl, NOSE-clear, THROAT-clear & wnl. NECK:  Supple w/ fairROM; no JVD; normal carotid impulses w/o bruits; no thyromegaly or nodules palpated; no lymphadenopathy. CHEST:  Clear to P & A; without wheezes/ rales/ or rhonchi heard.[name] HEART:  Regular Rhythm; without murmurs/ rubs/ or gallops detected... ABDOMEN:  Soft & nontender; normal bowel sounds; no organomegaly or masses palpated... EXT:  mod arthritic changes; +varicose veins/ +venous insuffic/ 4+edema in left foot w/ stasis dermatitis... NEURO:  CN's intact;  no focal neuro deficits... DERM:  chr lymphedema L>R & min dermatitis in skin of LE's  RADIOLOGY DATA:  Reviewed in the EPIC EMR & discussed w/ the patient...  LABORATORY DATA:  Reviewed in the EPIC EMR & discussed w/ the patient...   Assessment & Plan:    Left KNEE PAIN>>  She will attempt Rx w/ moist heat, heating pad, ALEVE vs TRAMADOL & we will refer to [facility] for  their review; hopefully shots will help...   COPD, Chr Bronchitis>  She was on Qvar, Proventil, & Theodur and she changed to ADVAIR 100Bid for awhile & we tried to stop the Theodur;  It is always unclear what she is taking as she never brings bottles or list to the office follow up visits, but she stopped the Advair & prefers the Theodur200Bid stating that this helps the most; has rescue inhaler for prn use... 08/13/15>  We decided to add ADVAIR-HFA 115/21 at 2spBid & gave her the 2016 FLU vaccine today... 02/08/16>  Stable, edema improved, reminded to bring all meds to every OV...  09/19/16>  We reviewed all meds w/ family member & decided to stop the Theo-24, continue inhalers, etc...  HBP>  Controlled on Coreg, Demadex & KCl per DrHSmith; instructed on 2gm Na diet...  Ven Insuffic & Lymphedema LEs>  We reviewed the need to for 2gm sodium diet, elev legs, support hose vs ACE wraps, continue the diuretic rx; she received srevices from numerous PT depts, wound care clinics, lymphedema clinic etc;  She received the best service via [facility] l[facility]hedema clinic...  Vit D Defic>  Her Vit D level is improved at 77, continue 50K weekly...  ANEMIA> She has a ?MGUS w/ IgG kappa paraprotein ident in 2012 but f/u labs in 2013-4 did not show monoclonal spike although both IgG & IgA levels are further elevated... She finally saw Heme- DrShadad & his notes are reviewed; he has her on oral iron therapy & observation...  Psychiatric Problem>  She persists w/ paranoid ideations revolving around worker's compensation disability;  She has repeatedly refused to go to mental health or allow me to refer to a psychiatrist;  She was seen in the ER on an Involuntary commitment petition 9/13 (by her son) but the psychiatrist rescinded the petition & sent her home...  She continues to refuse any psychiatric help...   Patient's Medications  New Prescriptions   DIPHENHYD-HYDROCORT-NYSTATIN (FIRST-DUKES MOUTHWASH)  SUSP    1 tsp gargle and swallow three times daily as needed  Previous Medications   ACETAMINOPHEN (TYLENOL 8 HOUR) 650 MG CR TABLET    Take 1 tablet (650 mg total) by mouth every 8 (eight) hours as needed for pain.   ALBUTEROL (PROAIR HFA) 108 (90 BASE) MCG/ACT INHALER    INHALE 2 PUFFS EVERY 4 HOURS AS NEEDED FOR WHEEZING   ALPRAZOLAM (XANAX) 0.5 MG TABLET    Take 1/2 to 1 tablet by mouth three times daily as needed for nerves   ASCORBIC ACID (VITAMIN C) 1000 MG TABLET    Take 2,000 mg by mouth daily.   CARVEDILOL (COREG) 6.25 MG TABLET    TAKE 1 TABLET BY MOUTH TWICE DAILY   CLOTRIMAZOLE-BETAMETHASONE (LOTRISONE) CREAM    Apply 1 application topically 2 (two) times daily. To your legs   FERROUS SULFATE DRIED 200 (65 FE) MG TABS    Take 1 tablet by mouth 2 (two) times daily.   FLUTICASONE-SALMETEROL (ADVAIR HFA) 115-21 MCG/ACT INHALER    USE 2 INHALATIONS TWICE A DAY   GABAPENTIN (NEURONTIN) 100 MG CAPSULE    Take 1 capsule in morning and 2 capsules at bedtime.   HYDRALAZINE (APRESOLINE) 25 MG TABLET    Take 25 mg by mouth 2 (two) times daily.   OLANZAPINE (ZYPREXA) 2.5 MG TABLET    Take 1 tablet (2.5  mg total) by mouth at bedtime.   ONDANSETRON (ZOFRAN) 4 MG TABLET    Take 1 tablet (4 mg total) by mouth every 8 (eight) hours as needed for nausea or vomiting.   POTASSIUM CHLORIDE SA (K-DUR,KLOR-CON) 20 MEQ TABLET    Take 2 tablets (40 mEq total) by mouth once.   SILVER SULFADIAZINE (SILVADENE) 1 % CREAM    Apply 1 application topically daily.   TORSEMIDE (DEMADEX) 20 MG TABLET    TAKE 3 TABLETS TWICE A DAY   TRAMADOL (ULTRAM) 50 MG TABLET    Take 1 tablet (50 mg total) by mouth 3 (three) times daily as needed.   VITAMIN D, ERGOCALCIFEROL, (DRISDOL) 50000 UNITS CAPS CAPSULE    Take 50,000 Units by mouth every 7 (seven) days.  Modified Medications   No medications on file  Discontinued Medications   THEO-24 400 MG 24 HR CAPSULE    TAKE 1 CAPSULE DAILY

## 2017-02-01 NOTE — Patient Instructions (Signed)
Today we updated your med list in our EPIC system...     We decided to STOP the Theo-24 (Theophyllin capsule) you were taking one daily...  Continue the ADVAIR-HFA 2 inhalations twice daily... (be sure to rinse out your mouth after the treatments).    And the Proair-HFA rescue inhaler - one inhalation every 6H as needed for wheezing...  For your sore throat>>    Use the new MAGIC MOUTHWASH - one tsp gargle 7 swallow up to 3 times daily as needed...    Keep this in the refrigerator to keep it fresh...  Continue your meds from DrCarter et al...  Call for any questions or if we can be of service in any way.Marland KitchenMarland Kitchen

## 2017-02-08 ENCOUNTER — Ambulatory Visit: Payer: Medicare Other | Admitting: Pulmonary Disease

## 2017-02-13 DIAGNOSIS — N183 Chronic kidney disease, stage 3 (moderate): Secondary | ICD-10-CM | POA: Diagnosis not present

## 2017-02-13 DIAGNOSIS — I10 Essential (primary) hypertension: Secondary | ICD-10-CM | POA: Diagnosis not present

## 2017-02-13 DIAGNOSIS — N2581 Secondary hyperparathyroidism of renal origin: Secondary | ICD-10-CM | POA: Diagnosis not present

## 2017-02-13 DIAGNOSIS — D638 Anemia in other chronic diseases classified elsewhere: Secondary | ICD-10-CM | POA: Diagnosis not present

## 2017-02-28 DIAGNOSIS — N390 Urinary tract infection, site not specified: Secondary | ICD-10-CM | POA: Diagnosis not present

## 2017-03-07 ENCOUNTER — Encounter: Payer: Self-pay | Admitting: Internal Medicine

## 2017-03-20 ENCOUNTER — Ambulatory Visit: Payer: Medicare Other | Admitting: Podiatry

## 2017-03-30 ENCOUNTER — Encounter: Payer: Medicare Other | Admitting: Internal Medicine

## 2017-04-10 DIAGNOSIS — E119 Type 2 diabetes mellitus without complications: Secondary | ICD-10-CM | POA: Diagnosis not present

## 2017-04-10 DIAGNOSIS — H25813 Combined forms of age-related cataract, bilateral: Secondary | ICD-10-CM | POA: Diagnosis not present

## 2017-04-19 ENCOUNTER — Ambulatory Visit: Payer: Medicare Other | Admitting: Neurology

## 2017-04-20 ENCOUNTER — Encounter: Payer: Self-pay | Admitting: Internal Medicine

## 2017-04-20 ENCOUNTER — Ambulatory Visit (INDEPENDENT_AMBULATORY_CARE_PROVIDER_SITE_OTHER): Payer: Medicare Other | Admitting: Internal Medicine

## 2017-04-20 VITALS — BP 134/74 | HR 73 | Temp 98.1°F | Ht <= 58 in | Wt 139.4 lb

## 2017-04-20 DIAGNOSIS — F22 Delusional disorders: Secondary | ICD-10-CM | POA: Diagnosis not present

## 2017-04-20 DIAGNOSIS — I890 Lymphedema, not elsewhere classified: Secondary | ICD-10-CM | POA: Diagnosis not present

## 2017-04-20 DIAGNOSIS — I10 Essential (primary) hypertension: Secondary | ICD-10-CM

## 2017-04-20 DIAGNOSIS — I872 Venous insufficiency (chronic) (peripheral): Secondary | ICD-10-CM

## 2017-04-20 DIAGNOSIS — I5022 Chronic systolic (congestive) heart failure: Secondary | ICD-10-CM

## 2017-04-20 LAB — COMPLETE METABOLIC PANEL WITH GFR
ALBUMIN: 3.6 g/dL (ref 3.6–5.1)
ALK PHOS: 65 U/L (ref 33–130)
ALT: 9 U/L (ref 6–29)
AST: 14 U/L (ref 10–35)
BUN: 31 mg/dL — AB (ref 7–25)
CALCIUM: 9.3 mg/dL (ref 8.6–10.4)
CO2: 27 mmol/L (ref 20–31)
CREATININE: 1.49 mg/dL — AB (ref 0.60–0.88)
Chloride: 100 mmol/L (ref 98–110)
GFR, Est African American: 37 mL/min — ABNORMAL LOW (ref >=60)
GFR, Est Non African American: 32 mL/min — ABNORMAL LOW (ref >=60)
Glucose, Bld: 130 mg/dL — ABNORMAL HIGH (ref 65–99)
POTASSIUM: 3.5 mmol/L (ref 3.5–5.3)
Sodium: 137 mmol/L (ref 135–146)
Total Bilirubin: 0.3 mg/dL (ref 0.2–1.2)
Total Protein: 8.4 g/dL — ABNORMAL HIGH (ref 6.1–8.1)

## 2017-04-20 NOTE — Patient Instructions (Addendum)
Keep legs elevated while seated or lying down  Follow up with lymphedema clinic for leg/foot swelling  Follow up with cardiology as scheduled  Will call with lab results  Follow up in 1 month for edema

## 2017-04-20 NOTE — Progress Notes (Signed)
Patient ID: [name], female   DOB: 1932/07/25, 83 y.o.   MRN: [id number]    Location:  [facility] Place of Service: OFFICE  Chief Complaint  Patient presents with  . Acute Visit    Feet Swelling     HPI:  83 yo female seen today for foot swelling. She reports no improvement of LE swelling. She states she is taking all meds as ordered and has been to the lymphedema clinic but states swelling no better. No SOB, CO, f/c. She takes 46m torsemide BID. Cr 1.45 and Na 139 in Jan 2018. Pt is a poor historian due to psychosis. Hx obtained from chart.  She continues to have delusions/psychosis and states her grandson, JHeron Sabins is "cutting" her foot b/l. She takes alprazolam and zyprexa  HTN - BP stable on coreg, hydralazine.  Chronic systolic HF - stable on coreg, torsemide + potassium and hydralazine. Followed by cardio Dr HDaneen Schick EF 35-40% by 2D echo in 2015. echo also revealed septal wall akinesis/severe hypokinesis; mod-sev diffuse hypokinesis.  Asthmatic bronchitis/chronic obstructive bronchitis- stable on advair, theo-24, proair HFA  Peripheral neuropathy - stable on gabapentin  Venous insufficiency/chronic LE lymphedema - stable on torsemide, clotrimazole/betamethasone cream, silvadene cream  CKD - stage 4. Cr 2.13  Anemia of chronic disease/MGUS - stable on iron supplement. Hgb 10.8; IgG 2600/IgA 408 in Apr 2017  Psychosis/ hallucinations - no behavioral disturbance and no formal psych evaluation. Family not willing for counseling at this time. She lives alone and gets meals on wheels. family stays with her at night most days of the week and she gets some PCS services  Chronic LBP  - stable   Past Medical History:  Diagnosis Date  . Anemia, unspecified   . Anxiety   . Arthritis   . Asthma   . Delusions ([place]   . Depression   . Edema 2005   "since put the glass in them"   . GERD (gastroesophageal reflux disease)   . Lumbago   . Lymphedema   . Obstructive chronic  bronchitis without exacerbation ([place]   . Other abnormal glucose   . Systolic heart failure (HCC)    EF 35 to 40% per echo January 2015  . Unspecified essential hypertension   . Unspecified nonpsychotic mental disorder   . Unspecified venous (peripheral) insufficiency   . Unspecified venous (peripheral) insufficiency   . Unspecified vitamin D deficiency     Past Surgical History:  Procedure Laterality Date  . APPENDECTOMY    . CERVICAL SPINE SURGERY     x 2  . CHOLECYSTECTOMY    . ESOPHAGOGASTRODUODENOSCOPY N/A 04/05/2015   Procedure: ESOPHAGOGASTRODUODENOSCOPY (EGD);  Surgeon: MLadene Artist MD;  [facility];  Service: Endoscopy;  Laterality: N/A;  . FLEXIBLE SIGMOIDOSCOPY N/A 04/05/2015   Procedure: FLEXIBLE SIGMOIDOSCOPY;  Surgeon: MLadene Artist MD;  [facility];  Service: Endoscopy;  Laterality: N/A;  . HAND SURGERY    . LUMBAR DISC SURGERY    . PARTIAL HYSTERECTOMY    . TONSILLECTOMY      Patient Care Team: CGildardo Cranker DO as PCP - General (Internal Medicine) NNoralee Space MD (Pulmonary Disease) TJacolyn Reedy MD as Consulting Physician (Cardiology) JPieter Partridge DO as Consulting Physician (Neurology)  Social History   Social History  . Marital status: Widowed    Spouse name: N/A  . Number of children: 0  . Years of education: N/A   Occupational History  . retired    SScience writer  History Main Topics  . Smoking status: Never Smoker  . Smokeless tobacco: Never Used  . Alcohol use No  . Drug use: No  . Sexual activity: Not Currently   Other Topics Concern  . Not on file   Social History Narrative   Diet? Good, low appetite      Do you drink/eat things with caffeine? no      Marital status?                  widowed                  What year were you married?      Do you live in a house, apartment, assisted living, condo, trailer, etc.? house      Is it one or more stories? 1 story      How many persons live in your home?  Only me      Do you have any pets in your home? (please list) no      Current or past profession: lab tech      Do you exercise?        some                              Type & how often? Foot rolls, some walking      Do you have a living will? yes      Do you have a DNR form?    no                              If not, do you want to discuss one?      Do you have signed POA/HPOA for forms? yes        reports that she has never smoked. She has never used smokeless tobacco. She reports that she does not drink alcohol or use drugs.  Family History  Problem Relation Age of Onset  . Diabetes Mother   . Arthritis Father   . Diabetes Paternal Grandfather        entire family on both sides  . Heart disease Maternal Aunt        entire family of both sides  . Cancer Paternal Aunt        type unknown  . Diabetes Sister    Family Status  Relation Status  . Mother Deceased  . Father Deceased  . MGM Deceased  . MGF Deceased  . PGM Deceased  . PGF Deceased  . Mat Aunt (Not Specified)  . Ethlyn Daniels (Not Specified)  . Sister Alive  . Brother Alive  . Sister Alive  . Brother Alive  . Sister Deceased     Allergies  Allergen Reactions  . Aspirin Other (See Comments)    REACTION: upset stomach  . Codeine Other (See Comments)    REACTION: "MAKES MY BODY GO CRAZY; CRAMPS"  . Latex Nausea And Vomiting    Medications: Patient's Medications  New Prescriptions   No medications on file  Previous Medications   ACETAMINOPHEN (TYLENOL 8 HOUR) 650 MG CR TABLET    Take 1 tablet (650 mg total) by mouth every 8 (eight) hours as needed for pain.   ALBUTEROL (PROAIR HFA) 108 (90 BASE) MCG/ACT INHALER    INHALE 2 PUFFS EVERY 4 HOURS AS NEEDED FOR WHEEZING   ALPRAZOLAM (XANAX) 0.5 MG TABLET  Take 1/2 to 1 tablet by mouth three times daily as needed for nerves   ASCORBIC ACID (VITAMIN C) 1000 MG TABLET    Take 2,000 mg by mouth daily.   CARVEDILOL (COREG) 6.25 MG TABLET    TAKE 1 TABLET BY  MOUTH TWICE DAILY   CLOTRIMAZOLE-BETAMETHASONE (LOTRISONE) CREAM    Apply 1 application topically 2 (two) times daily. To your legs   DIPHENHYD-HYDROCORT-NYSTATIN (FIRST-DUKES MOUTHWASH) SUSP    1 tsp gargle and swallow three times daily as needed   FERROUS SULFATE DRIED 200 (65 FE) MG TABS    Take 1 tablet by mouth 2 (two) times daily.   FLUTICASONE-SALMETEROL (ADVAIR HFA) 115-21 MCG/ACT INHALER    USE 2 INHALATIONS TWICE A DAY   GABAPENTIN (NEURONTIN) 100 MG CAPSULE    Take 1 capsule in morning and 2 capsules at bedtime.   HYDRALAZINE (APRESOLINE) 25 MG TABLET    Take 25 mg by mouth 2 (two) times daily.   OLANZAPINE (ZYPREXA) 2.5 MG TABLET    Take 1 tablet (2.5 mg total) by mouth at bedtime.   ONDANSETRON (ZOFRAN) 4 MG TABLET    Take 1 tablet (4 mg total) by mouth every 8 (eight) hours as needed for nausea or vomiting.   POTASSIUM CHLORIDE SA (K-DUR,KLOR-CON) 20 MEQ TABLET    Take 2 tablets (40 mEq total) by mouth once.   SILVER SULFADIAZINE (SILVADENE) 1 % CREAM    Apply 1 application topically daily.   TORSEMIDE (DEMADEX) 20 MG TABLET    TAKE 3 TABLETS TWICE A DAY   TRAMADOL (ULTRAM) 50 MG TABLET    Take 1 tablet (50 mg total) by mouth 3 (three) times daily as needed.   VITAMIN D, ERGOCALCIFEROL, (DRISDOL) 50000 UNITS CAPS CAPSULE    Take 50,000 Units by mouth every 7 (seven) days.  Modified Medications   No medications on file  Discontinued Medications   No medications on file    Review of Systems  Unable to perform ROS: Dementia    Vitals:   04/20/17 1541  BP: 134/74  Pulse: 73  Temp: 98.1 F (36.7 C)  TempSrc: Oral  SpO2: 98%  Weight: 139 lb 6.4 oz (63.2 kg)  Height: '4\' 10"'$  (1.473 m)   Body mass index is 29.13 kg/m.  Physical Exam  HENT:  Mouth/Throat: No oropharyngeal exudate.  Neck: Neck supple.  Cardiovascular: Normal rate, regular rhythm and intact distal pulses.  Exam reveals no gallop and no friction rub.   Murmur (1/6 SEM) heard. Marked L>RLE lymphedema with  chronic venous stasis changes; min abrasions. No calf TTP  Pulmonary/Chest: Effort normal and breath sounds normal. No respiratory distress. She has no wheezes. She has no rales. She exhibits no tenderness.  Musculoskeletal: She exhibits edema.  Neurological: She is alert.  Skin: Skin is warm and dry. Rash noted.  Psychiatric: She has a normal mood and affect. Her behavior is normal.     Labs reviewed: No visits with results within 3 Month(s) from this visit.  Latest known visit with results is:  Appointment on 11/24/2016  Component Date Value Ref Range Status  . Sodium 11/24/2016 139  135 - 146 mmol/L Final  . Potassium 11/24/2016 3.9  3.5 - 5.3 mmol/L Final  . Chloride 11/24/2016 102  98 - 110 mmol/L Final  . CO2 11/24/2016 22  20 - 31 mmol/L Final  . Glucose, Bld 11/24/2016 100* 65 - 99 mg/dL Final  . BUN 11/24/2016 37* 7 - 25 mg/dL Final  .  Creat 11/24/2016 1.45* 0.60 - 0.88 mg/dL Final   Comment:   For patients > or = 81 years of age: The upper reference limit for Creatinine is approximately 13% higher for people identified as African-American.     . Total Bilirubin 11/24/2016 0.5  0.2 - 1.2 mg/dL Final  . Alkaline Phosphatase 11/24/2016 73  33 - 130 U/L Final  . AST 11/24/2016 21  10 - 35 U/L Final  . ALT 11/24/2016 13  6 - 29 U/L Final  . Total Protein 11/24/2016 8.6* 6.1 - 8.1 g/dL Final  . Albumin 11/24/2016 3.9  3.6 - 5.1 g/dL Final  . Calcium 11/24/2016 10.0  8.6 - 10.4 mg/dL Final  . GFR, Est African American 11/24/2016 39* >=60 mL/min Final  . GFR, Est Non African American 11/24/2016 34* >=60 mL/min Final  . WBC 11/24/2016 5.6  3.8 - 10.8 K/uL Final  . RBC 11/24/2016 4.01  3.80 - 5.10 MIL/uL Final  . Hemoglobin 11/24/2016 10.8* 11.7 - 15.5 g/dL Final  . HCT 11/24/2016 33.8* 35.0 - 45.0 % Final  . MCV 11/24/2016 84.3  80.0 - 100.0 fL Final  . MCH 11/24/2016 26.9* 27.0 - 33.0 pg Final  . MCHC 11/24/2016 32.0  32.0 - 36.0 g/dL Final  . RDW 11/24/2016 15.7* 11.0 -  15.0 % Final  . Platelets 11/24/2016 264  140 - 400 K/uL Final  . MPV 11/24/2016 8.8  7.5 - 12.5 fL Final  . Neutro Abs 11/24/2016 3976  1,500 - 7,800 cells/uL Final  . Lymphs Abs 11/24/2016 1232  850 - 3,900 cells/uL Final  . Monocytes Absolute 11/24/2016 392  200 - 950 cells/uL Final  . Eosinophils Absolute 11/24/2016 0* 15 - 500 cells/uL Final  . Basophils Absolute 11/24/2016 0  0 - 200 cells/uL Final  . Neutrophils Relative % 11/24/2016 71  % Final  . Lymphocytes Relative 11/24/2016 22  % Final  . Monocytes Relative 11/24/2016 7  % Final  . Eosinophils Relative 11/24/2016 0  % Final  . Basophils Relative 11/24/2016 0  % Final  . Smear Review 11/24/2016 Criteria for review not met   Final  . Cholesterol 11/24/2016 207* <200 mg/dL Final  . Triglycerides 11/24/2016 73  <150 mg/dL Final  . HDL 11/24/2016 81  >50 mg/dL Final  . Total CHOL/HDL Ratio 11/24/2016 2.6  <5.0 Ratio Final  . VLDL 11/24/2016 15  <30 mg/dL Final  . LDL Cholesterol 11/24/2016 111* <100 mg/dL Final  . TSH 11/24/2016 1.11  mIU/L Final   Comment:   Reference Range   > or = 20 Years  0.40-4.50   Pregnancy Range First trimester  0.26-2.66 Second trimester 0.55-2.73 Third trimester  0.43-2.91       No results found.   Assessment/Plan   ICD-10-CM   1. Lymphedema of both lower extremities I89.0 CMP with eGFR  2. Venous (peripheral) insufficiency I87.2   3. Chronic systolic heart failure ([place]) I50.22 Ambulatory referral to Cardiology  4. Essential hypertension I10 Ambulatory referral to Cardiology  5. Paranoid delusion ([place]) [place]     S/w HCPOA, [place], in exam room. Pt has not been to lymphedema clinic in at least 1 yr and he has not been able to get her to go but will try harder. He was provided with name/location of clinic. Referral placed by Dr Mariea Clonts at last [place].   T/c increasing torsemide to '80mg'$  BID pending lab results  Keep legs elevated while seated or lying down  Follow up with lymphedema clinic  for leg/foot swelling  Follow up with cardiology as scheduled  Will call with lab results  Follow up in 1 month for edema   Norton Bivins S. Perlie Gold  [facility] [address] [place], [place] [address] [phone] Cell (Monday-Friday 8 AM - 5 PM) [phone] After 5 PM and follow prompts

## 2017-04-24 ENCOUNTER — Telehealth: Payer: Self-pay

## 2017-04-24 NOTE — Telephone Encounter (Signed)
Called patient to inform her that Cardiology referral has been placed and that she will be contacted by their office to schedule a  I have also informed her of lab results.  She is requesting for pain meds to be called in for back pain.  She states the Tramadol is not helping.  Pharmacy: [facility] [place]  Call back #: [phone].

## 2017-04-25 NOTE — Progress Notes (Deleted)
Cardiology Office Note    Date:  04/25/2017   ID:  [name] 02-27-1932, MRN [id number]  PCP:  Gildardo Cranker, DO  Cardiologist: Sinclair Grooms, MD   No chief complaint on file.   History of Present Illness:  [name] is a 83 y.o. female  hx of venous insufficiency, systolic CHF, HTN, COPD, CKD, MGUS, anemia. Echo in 1/15 with EF 35-40%. FU nuclear study in 3/15 with EF 50%    Past Medical History:  Diagnosis Date  . Anemia, unspecified   . Anxiety   . Arthritis   . Asthma   . Delusions ([place])   . Depression   . Edema 2005   "since put the glass in them"   . GERD (gastroesophageal reflux disease)   . Lumbago   . Lymphedema   . Obstructive chronic bronchitis without exacerbation ([place])   . Other abnormal glucose   . Systolic heart failure (HCC)    EF 35 to 40% per echo January 2015  . Unspecified essential hypertension   . Unspecified nonpsychotic mental disorder   . Unspecified venous (peripheral) insufficiency   . Unspecified venous (peripheral) insufficiency   . Unspecified vitamin D deficiency     Past Surgical History:  Procedure Laterality Date  . APPENDECTOMY    . CERVICAL SPINE SURGERY     x 2  . CHOLECYSTECTOMY    . ESOPHAGOGASTRODUODENOSCOPY N/A 04/05/2015   Procedure: ESOPHAGOGASTRODUODENOSCOPY (EGD);  Surgeon: [name], MD;  [facility];  Service: Endoscopy;  Laterality: N/A;  . FLEXIBLE SIGMOIDOSCOPY N/A 04/05/2015   Procedure: FLEXIBLE SIGMOIDOSCOPY;  Surgeon: [name], MD;  [facility];  Service: Endoscopy;  Laterality: N/A;  . HAND SURGERY    . LUMBAR DISC SURGERY    . PARTIAL HYSTERECTOMY    . TONSILLECTOMY      Current Medications: Outpatient Medications Prior to Visit  Medication Sig Dispense Refill  . acetaminophen (TYLENOL 8 HOUR) 650 MG CR tablet Take 1 tablet (650 mg total) by mouth every 8 (eight) hours as needed for pain. 30 tablet 0  . albuterol (PROAIR HFA) 108 (90 Base)  MCG/ACT inhaler INHALE 2 PUFFS EVERY 4 HOURS AS NEEDED FOR WHEEZING 3 Inhaler 3  . ALPRAZolam (XANAX) 0.5 MG tablet Take 1/2 to 1 tablet by mouth three times daily as needed for nerves    . Ascorbic Acid (VITAMIN C) 1000 MG tablet Take 2,000 mg by mouth daily.    . carvedilol (COREG) 6.25 MG tablet TAKE 1 TABLET BY MOUTH TWICE DAILY 180 tablet 1  . clotrimazole-betamethasone (LOTRISONE) cream Apply 1 application topically 2 (two) times daily. To your legs 45 g 3  . Diphenhyd-Hydrocort-Nystatin (FIRST-DUKES MOUTHWASH) SUSP 1 tsp gargle and swallow three times daily as needed 120 mL 5  . Ferrous Sulfate Dried 200 (65 FE) MG TABS Take 1 tablet by mouth 2 (two) times daily. 30 tablet 2  . fluticasone-salmeterol (ADVAIR HFA) 115-21 MCG/ACT inhaler USE 2 INHALATIONS TWICE A DAY 36 g 3  . gabapentin (NEURONTIN) 100 MG capsule Take 1 capsule in morning and 2 capsules at bedtime. 90 capsule 4  . hydrALAZINE (APRESOLINE) 25 MG tablet Take 25 mg by mouth 2 (two) times daily.    [name] OLANZapine (ZYPREXA) 2.5 MG tablet Take 1 tablet (2.5 mg total) by mouth at bedtime. 30 tablet 3  . ondansetron (ZOFRAN) 4 MG tablet Take 1 tablet (4 mg total) by mouth every 8 (eight) hours as needed  for nausea or vomiting. 9 tablet 0  . potassium chloride SA (K-DUR,KLOR-CON) 20 MEQ tablet Take 2 tablets (40 mEq total) by mouth once. 180 tablet 3  . silver sulfADIAZINE (SILVADENE) 1 % cream Apply 1 application topically daily.    [name] torsemide (DEMADEX) 20 MG tablet TAKE 3 TABLETS TWICE A DAY 540 tablet 1  . traMADol (ULTRAM) 50 MG tablet Take 1 tablet (50 mg total) by mouth 3 (three) times daily as needed. 90 tablet 3  . Vitamin D, Ergocalciferol, (DRISDOL) 50000 units CAPS capsule Take 50,000 Units by mouth every 7 (seven) days.     No facility-administered medications prior to visit.      Allergies:   Aspirin; Codeine; and Latex   Social History   Social History  . Marital status: Widowed    Spouse name: N/A  . Number of  children: 0  . Years of education: N/A   Occupational History  . retired    Social History Main Topics  . Smoking status: Never Smoker  . Smokeless tobacco: Never Used  . Alcohol use No  . Drug use: No  . Sexual activity: Not Currently   Other Topics Concern  . Not on file   Social History Narrative   Diet? Good, low appetite      Do you drink/eat things with caffeine? no      Marital status?                  widowed                  What year were you married?      Do you live in a house, apartment, assisted living, condo, trailer, etc.? house      Is it one or more stories? 1 story      How many persons live in your home? Only me      Do you have any pets in your home? (please list) no      Current or past profession: lab tech      Do you exercise?        some                              Type & how often? Foot rolls, some walking      Do you have a living will? yes      Do you have a DNR form?    no                              If not, do you want to discuss one?      Do you have signed POA/HPOA for forms? yes        Family History:  The patient's ***family history includes Arthritis in her father; Cancer in her paternal aunt; Diabetes in her mother, paternal grandfather, and sister; Heart disease in her maternal aunt.   ROS:   Please see the history of present illness.    ***  All other systems reviewed and are negative.   PHYSICAL EXAM:   VS:  There were no vitals taken for this visit.   GEN: Well nourished, well developed, in no acute distress  HEENT: normal  Neck: no JVD, carotid bruits, or masses Cardiac: ***RRR; no murmurs, rubs, or gallops,no edema  Respiratory:  clear to auscultation bilaterally, normal work of breathing GI:  soft, nontender, nondistended, + BS MS: no deformity or atrophy  Skin: warm and dry, no rash Neuro:  Alert and Oriented x 3, Strength and sensation are intact Psych: euthymic mood, full affect  Wt Readings from Last 3  Encounters:  04/20/17 139 lb 6.4 oz (63.2 kg)  02/01/17 142 lb (64.4 kg)  01/24/17 146 lb (66.2 kg)      Studies/Labs Reviewed:   EKG:  EKG  ***  Recent Labs: 11/24/2016: Hemoglobin 10.8; Platelets 264; TSH 1.11 04/20/2017: ALT 9; BUN 31; Creat 1.49; Potassium 3.5; Sodium 137   Lipid Panel    Component Value Date/Time   CHOL 207 (H) 11/24/2016 0929   TRIG 73 11/24/2016 0929   HDL 81 11/24/2016 0929   CHOLHDL 2.6 11/24/2016 0929   VLDL 15 11/24/2016 0929   LDLCALC 111 (H) 11/24/2016 0929    Additional studies/ records that were reviewed today include:  ***    ASSESSMENT:    1. Chronic systolic heart failure ([place])   2. Essential hypertension   3. Obstructive chronic bronchitis ([place])   4. CKD (chronic kidney disease) stage 3, GFR 30-59 ml/min      PLAN:  In order of problems listed above:  1. ***    Medication Adjustments/Labs and Tests Ordered: Current medicines are reviewed at length with the patient today.  Concerns regarding medicines are outlined above.  Medication changes, Labs and Tests ordered today are listed in the Patient Instructions below. There are no Patient Instructions on file for this visit.   Signed, Sinclair Grooms, MD  04/25/2017 9:33 PM    [facility] [place], [place], [place]  [address] Phone: [phone]; Fax: [phone]

## 2017-04-26 ENCOUNTER — Ambulatory Visit: Payer: Medicare Other | Admitting: Interventional Cardiology

## 2017-04-26 MED ORDER — GABAPENTIN 100 MG PO CAPS: ORAL_CAPSULE | ORAL | 3 refills | Status: DC

## 2017-04-26 NOTE — Telephone Encounter (Signed)
Increase gabapentin 100mg  # 120  Take 2 caps in AM and 2 caps qHS with 3 RF

## 2017-04-26 NOTE — Telephone Encounter (Signed)
Pt notified and voiced understanding.  Rx has been sent to  Pharmacy.
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
## 2017-05-08 ENCOUNTER — Encounter: Payer: Self-pay | Admitting: Interventional Cardiology

## 2017-05-08 ENCOUNTER — Other Ambulatory Visit: Payer: Self-pay | Admitting: Pulmonary Disease

## 2017-05-08 ENCOUNTER — Ambulatory Visit (INDEPENDENT_AMBULATORY_CARE_PROVIDER_SITE_OTHER): Payer: Medicare Other | Admitting: Interventional Cardiology

## 2017-05-08 VITALS — BP 156/84 | HR 63 | Ht <= 58 in | Wt 144.6 lb

## 2017-05-08 DIAGNOSIS — N183 Chronic kidney disease, stage 3 unspecified: Secondary | ICD-10-CM

## 2017-05-08 DIAGNOSIS — I10 Essential (primary) hypertension: Secondary | ICD-10-CM

## 2017-05-08 DIAGNOSIS — I5022 Chronic systolic (congestive) heart failure: Secondary | ICD-10-CM | POA: Diagnosis not present

## 2017-05-08 DIAGNOSIS — F22 Delusional disorders: Secondary | ICD-10-CM

## 2017-05-08 MED ORDER — FLUTICASONE-SALMETEROL 115-21 MCG/ACT IN AERO: INHALATION_SPRAY | RESPIRATORY_TRACT | 3 refills | Status: DC

## 2017-05-08 MED ORDER — TORSEMIDE 20 MG PO TABS: 60.0000 mg | ORAL_TABLET | Freq: Two times a day (BID) | ORAL | 1 refills | Status: DC

## 2017-05-08 NOTE — Patient Instructions (Signed)
Medication Instructions:  None  Labwork: None  Testing/Procedures: Your physician has requested that you have an echocardiogram. Echocardiography is a painless test that uses sound waves to create images of your heart. It provides your doctor with information about the size and shape of your heart and how well your heart's chambers and valves are working. This procedure takes approximately one hour. There are no restrictions for this procedure.   Follow-Up: Your physician wants you to follow-up in: 6-9 months with Dr. Smith.  You will receive a reminder letter in the mail two months in advance. If you don't receive a letter, please call our office to schedule the follow-up appointment.   Any Other Special Instructions Will Be Listed Below (If Applicable).     If you need a refill on your cardiac medications before your next appointment, please call your pharmacy.   

## 2017-05-12 ENCOUNTER — Emergency Department (HOSPITAL_COMMUNITY)
Admission: EM | Admit: 2017-05-12 | Discharge: 2017-05-14 | Disposition: A | Discharge status: Home / Self Care | Payer: Medicare Other | Attending: Emergency Medicine | Admitting: Emergency Medicine

## 2017-05-12 ENCOUNTER — Encounter (HOSPITAL_COMMUNITY): Payer: Self-pay | Admitting: Emergency Medicine

## 2017-05-12 DIAGNOSIS — F329 Major depressive disorder, single episode, unspecified: Secondary | ICD-10-CM | POA: Diagnosis not present

## 2017-05-12 DIAGNOSIS — R413 Other amnesia: Secondary | ICD-10-CM | POA: Diagnosis not present

## 2017-05-12 DIAGNOSIS — I5022 Chronic systolic (congestive) heart failure: Secondary | ICD-10-CM | POA: Insufficient documentation

## 2017-05-12 DIAGNOSIS — N183 Chronic kidney disease, stage 3 (moderate): Secondary | ICD-10-CM | POA: Insufficient documentation

## 2017-05-12 DIAGNOSIS — Z79899 Other long term (current) drug therapy: Secondary | ICD-10-CM | POA: Insufficient documentation

## 2017-05-12 DIAGNOSIS — Z9049 Acquired absence of other specified parts of digestive tract: Secondary | ICD-10-CM | POA: Diagnosis not present

## 2017-05-12 DIAGNOSIS — J45909 Unspecified asthma, uncomplicated: Secondary | ICD-10-CM | POA: Insufficient documentation

## 2017-05-12 DIAGNOSIS — I130 Hypertensive heart and chronic kidney disease with heart failure and stage 1 through stage 4 chronic kidney disease, or unspecified chronic kidney disease: Secondary | ICD-10-CM | POA: Insufficient documentation

## 2017-05-12 DIAGNOSIS — F419 Anxiety disorder, unspecified: Secondary | ICD-10-CM | POA: Diagnosis not present

## 2017-05-12 DIAGNOSIS — F22 Delusional disorders: Secondary | ICD-10-CM | POA: Diagnosis present

## 2017-05-12 DIAGNOSIS — Z91040 Latex allergy status: Secondary | ICD-10-CM | POA: Insufficient documentation

## 2017-05-12 DIAGNOSIS — R1030 Lower abdominal pain, unspecified: Secondary | ICD-10-CM | POA: Diagnosis present

## 2017-05-12 LAB — COMPREHENSIVE METABOLIC PANEL
ALT: 12 U/L — ABNORMAL LOW (ref 14–54)
ANION GAP: 8 (ref 5–15)
AST: 20 U/L (ref 15–41)
Albumin: 3.6 g/dL (ref 3.5–5.0)
Alkaline Phosphatase: 59 U/L (ref 38–126)
BUN: 30 mg/dL — ABNORMAL HIGH (ref 6–20)
CHLORIDE: 103 mmol/L (ref 101–111)
CO2: 26 mmol/L (ref 22–32)
Calcium: 9.7 mg/dL (ref 8.9–10.3)
Creatinine, Ser: 1.49 mg/dL — ABNORMAL HIGH (ref 0.44–1.00)
GFR, EST AFRICAN AMERICAN: 36 mL/min — AB (ref >60)
GFR, EST NON AFRICAN AMERICAN: 31 mL/min — AB (ref >60)
Glucose, Bld: 74 mg/dL (ref 65–99)
POTASSIUM: 4.6 mmol/L (ref 3.5–5.1)
Sodium: 137 mmol/L (ref 135–145)
Total Bilirubin: 0.6 mg/dL (ref 0.3–1.2)
Total Protein: 8.6 g/dL — ABNORMAL HIGH (ref 6.5–8.1)

## 2017-05-12 LAB — RAPID URINE DRUG SCREEN, HOSP PERFORMED
AMPHETAMINES: NOT DETECTED
BENZODIAZEPINES: NOT DETECTED
Barbiturates: NOT DETECTED
Cocaine: NOT DETECTED
OPIATES: NOT DETECTED
Tetrahydrocannabinol: NOT DETECTED

## 2017-05-12 LAB — CBC
HCT: 29.1 % — ABNORMAL LOW (ref 36.0–46.0)
Hemoglobin: 9.2 g/dL — ABNORMAL LOW (ref 12.0–15.0)
MCH: 26.7 pg (ref 26.0–34.0)
MCHC: 31.6 g/dL (ref 30.0–36.0)
MCV: 84.6 fL (ref 78.0–100.0)
PLATELETS: 202 10*3/uL (ref 150–400)
RBC: 3.44 MIL/uL — ABNORMAL LOW (ref 3.87–5.11)
RDW: 16.6 % — ABNORMAL HIGH (ref 11.5–15.5)
WBC: 5.1 10*3/uL (ref 4.0–10.5)

## 2017-05-12 LAB — URINALYSIS, ROUTINE W REFLEX MICROSCOPIC
BILIRUBIN URINE: NEGATIVE
Glucose, UA: NEGATIVE mg/dL
Hgb urine dipstick: NEGATIVE
Ketones, ur: NEGATIVE mg/dL
NITRITE: NEGATIVE
Protein, ur: NEGATIVE mg/dL
SPECIFIC GRAVITY, URINE: 1.01 (ref 1.005–1.030)
pH: 8 (ref 5.0–8.0)

## 2017-05-12 LAB — ETHANOL

## 2017-05-12 MED ORDER — ALPRAZOLAM 0.25 MG PO TABS
0.2500 mg | ORAL_TABLET | Freq: Three times a day (TID) | ORAL | Status: DC | PRN
Start: 2017-05-12 — End: 2017-05-14
  Administered 2017-05-13: 0.25 mg via ORAL
  Filled 2017-05-12: qty 1

## 2017-05-12 MED ORDER — TORSEMIDE 20 MG PO TABS
60.0000 mg | ORAL_TABLET | Freq: Two times a day (BID) | ORAL | Status: DC
  Administered 2017-05-13 – 2017-05-14 (×4): 60 mg via ORAL
  Filled 2017-05-12 (×4): qty 3

## 2017-05-12 MED ORDER — MOMETASONE FURO-FORMOTEROL FUM 200-5 MCG/ACT IN AERO
2.0000 | INHALATION_SPRAY | Freq: Two times a day (BID) | RESPIRATORY_TRACT | Status: DC
  Administered 2017-05-13 – 2017-05-14 (×4): 2 via RESPIRATORY_TRACT
  Filled 2017-05-12: qty 8.8

## 2017-05-12 MED ORDER — HYDRALAZINE HCL 25 MG PO TABS
25.0000 mg | ORAL_TABLET | Freq: Two times a day (BID) | ORAL | Status: DC
  Administered 2017-05-13 – 2017-05-14 (×4): 25 mg via ORAL
  Filled 2017-05-12 (×4): qty 1

## 2017-05-12 MED ORDER — ALBUTEROL SULFATE HFA 108 (90 BASE) MCG/ACT IN AERS: 2.0000 | INHALATION_SPRAY | RESPIRATORY_TRACT | Status: DC | PRN

## 2017-05-12 MED ORDER — GABAPENTIN 100 MG PO CAPS
100.0000 mg | ORAL_CAPSULE | Freq: Two times a day (BID) | ORAL | Status: DC
  Administered 2017-05-13 – 2017-05-14 (×4): 100 mg via ORAL
  Filled 2017-05-12 (×4): qty 1

## 2017-05-12 MED ORDER — CARVEDILOL 12.5 MG PO TABS
6.2500 mg | ORAL_TABLET | Freq: Two times a day (BID) | ORAL | Status: DC
  Administered 2017-05-13 – 2017-05-14 (×3): 6.25 mg via ORAL
  Filled 2017-05-12 (×3): qty 1
  Filled 2017-05-12: qty 0.5

## 2017-05-12 MED ORDER — OLANZAPINE 5 MG PO TABS
2.5000 mg | ORAL_TABLET | Freq: Every day | ORAL | Status: DC
  Administered 2017-05-13 (×2): 2.5 mg via ORAL
  Filled 2017-05-12 (×2): qty 1
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
## 2017-05-12 NOTE — ED Notes (Signed)
Pt wand by security.  
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
## 2017-05-12 NOTE — ED Notes (Signed)
TTS at bedside. 
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
## 2017-05-13 DIAGNOSIS — F22 Delusional disorders: Secondary | ICD-10-CM | POA: Diagnosis not present

## 2017-05-13 NOTE — ED Notes (Signed)
Pt ambulated to the restroom with cane and assist.

## 2017-05-13 NOTE — Progress Notes (Signed)
Patient has been recommended inpatient treatment per Lindon Romp FNP on 05/12/17.  Patient has been referred to the following inpatient treatment facilities: [facility], [facility], [facility], [facility], [facility], [facility], [facility].  All other geriatric facilities are at capacity today.  CSW in disposition will continue to seek placement for patient.  Verlon Setting, [facility] Disposition staff 05/13/2017 9:04 AM
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
## 2017-05-13 NOTE — ED Notes (Signed)
Durable Power of Attorney paperwork from nephew [name] [place]) faxed to [facility], copy sent to Medical Records

## 2017-05-13 NOTE — ED Notes (Signed)
Pt ambulatory to F9 w/cane - Nephew and spouse w/pt. Pt wearing burgundy scrubs. No eyeglasses w/pt - states they are at home.

## 2017-05-13 NOTE — ED Notes (Signed)
Pt sitting on the side of the bed eating breakfast.

## 2017-05-13 NOTE — ED Notes (Signed)
TTS in progress at this time.  

## 2017-05-13 NOTE — Progress Notes (Addendum)
Per [facility]-ED RN [place] Bowl, patient's nephew and power of attorney, Alan [place] [phone]) inquired if patient can be discharged home since pt's POA is familiar with patient's behavior at home reporting "This is nothing new." TTS Counselor Raquel Sarna, who reassessed pt earlier today, advised that pt's case was discussed with [facility] NP [place] and [facility] AC [place].  The recommendation was for patient to wait for inpatient psychiatric treatment placement until tomorrow, so that if no bed is found, patient can be reassessed for discharge tomorrow on 7/16.  [facility] RN [facility] and pt's POA Alan [place] [phone] were notified of treatment planning for this patient. POA agreeable with plan.  Verlon Setting, [place] Disposition staff 05/13/2017 3:24 PM

## 2017-05-13 NOTE — ED Notes (Addendum)
[name], nephew, who advises he is pt's POA. Requested copy of paperwork - states pt's PCP has it. Advised him we will also need a copy - voiced understanding - spouse going to get from vehicle. Nephew states he does not want pt to be admitted to psych facility. States this is not a new issue/concern for her as she has dementia and her PCP and other providers are treating her. States he was not the one who brought her to ED. States pt has people who come in to her home throughout the day including Meals on Wheels and he also visits daily. States he wants her PCP to be involved in pt's care. RN spoke w/[name], [facility] - advised nephew, per [name]'s conversation w/[name], that [name] had spoken w/him re: tx plan - will stay overnight in ED while inpt tx is being sought and will be reassessed tomorrow. Nephew voiced understanding and appreciation. States he wants to be contacted w/any updates and continues to state he does not want pt to be admitted - [place], [facility], aware. Pt is noted to be calm, cooperative. States she does not know why she is here - voiced agreement to stay in ED overnight. Pt's cane placed at bedside. Pt return demonstrated use of call bell.

## 2017-05-13 NOTE — ED Notes (Signed)
Assisted pt w/lowering head of bed - states she is going to sleep now. Legs elevated w/folded blanket and lower portion of bed.

## 2017-05-14 DIAGNOSIS — F22 Delusional disorders: Secondary | ICD-10-CM | POA: Diagnosis not present

## 2017-05-14 DIAGNOSIS — R413 Other amnesia: Secondary | ICD-10-CM | POA: Diagnosis not present

## 2017-05-14 NOTE — Progress Notes (Signed)
Per Elmarie Shiley, NP patient does not meet criteria for inpatient treatment. Recommended for D/C with outpatient follow up.   Per Alan [place] (nephew/POA), patient is following up with PCP, Dr. Gildardo Cranker. Patient has an appointment tomorrow at 2:15pm.   CSW spoke with patient's nephew/POA, Alan [place] [phone]). [name] agreed to pick patient up at 3:00pm.   Luellen Pucker, RN notified  Radonna Ricker MSW, [place] Disposition [phone]

## 2017-05-14 NOTE — ED Triage Notes (Signed)
TC from [facility] assessment  . Pt is to be discharged today . Pt  Will be picked up by Nephew at [place].

## 2017-05-14 NOTE — Discharge Instructions (Signed)
Substance Abuse Treatment Programs ° °Intensive Outpatient Programs °[facility]     °[address]      °[place], [place]                   °[phone]      ° °[facility] °[address] °[place], [place] °[phone] ° °[facility]     °(Inpatient and outpatient)     °[address]           °[phone]   ° °[facility] °[phone] (Suboxone and Methadone) ° °[address]      °[place], [place] [address]      °[phone]      ° °[address] °[place], [place] °[phone] ° °[facility] (Outpatient/Inpatient, Chemical)    °(insurance only) [phone]      °       °[facility] (Groups & Residential) °[place], [place] °[phone] ° °   °[facility]     °[address]     °[place], [place]      °[phone]      ° °[facility] (for caregivers and family) °[address] °[place], [place] °336-299-4655 ° ° ° ° ° °Residential Treatment Programs °[facility]      °[address], [place], [place] [address]  °[phone]      ° °[facility] °[address] [place], [place] [address] °[phone] ° °[facility]        °[phone]      ° °[facility] °[phone] ° °[facility]             °[address]                                         °[place], [place]                                                °[phone] or 336-784-9470                              ° °[facility] [place] °[address] °[place], [address] °[phone] ° °[facility]    °[address]      °[place], [place]     °[phone]      ° °[facility] °[address] °[place], [place] °[phone] ° °[facility]   °[address]     °[place], [place] [address]     °[phone]      °Admissions: 8am-3pm M-F ° °[facility] °[address] °[place],  [place] °336-227-7417 ° °[facility]: Residential Program (90 Days)   °[place], [place]      °336-725-8389 or 800-758-6077    ° °[facility] °[place],  °(Walk in Hours over the weekend or by referral) ° °[facility] °[address], [place],  [address] °([phone] ° °Crisis Mobile: [facility]:  1-877-626-1772 (for crisis response 24 hours a day) °[facility] Hotline:      1-800-256-2452 °Outpatient Psychiatry and Counseling ° °[facility]: Mobile Crisis   Management 24 hours:  1-877-626-1772 ° °[facility] sliding scale fee and walk in schedule: M-F 8am-12pm/1pm-3pm °[address]  °[place], [place] [address] °[phone] ° °[facility] °[address] °[place], [place] [address] °[phone] ° °[facility] ([facility] known [facility])- new patient walk-in appointments available Monday - Friday 8am -3pm.          °[address] °[place], [place] [address] °336-676-6840 or crisis line- 336-676-6905 ° ° [facility]/ Intensive Outpatient Therapy Program °[address] °[place], [place] [address] °336-832-9804 ° °[facility]                  °Crisis Services      °[phone]      °[address]     °[place], [place] [address]                ° °[facility]   °[facility] °[phone] °[address] °[place], [place] [address] ° ° °[facility]          °[address]  °[place], [place] [address]       °[phone] ° °[facility] °[address] °[place], [place] [address] °336-292-1510 ° °[facility]    °[address]    °[place], [place] [address]     °[phone]      ° °[facility] McKin[facility], MD     °[address]     °[place] [address]     °[phone]     °  °[facility] °[address] °[place] [address] ° °[facility]     °[address]     °[place], [place]      °336-542-2076      ° °[facility] °Shamsher Ahluwalia, MD °[address] °[place], [place] [address] °[phone] ° °[facility]     °[address]     °[place], [place] [address]     °336-274-1237      ° °[facility] °[address] °[place], [place] [address] °336-854-4450 °Resources for Temporary Residential Assistance/Crisis Centers ° °DAY CENTERS °[facility] °M-F 8am-3pm   °[address] [place], [place] [address]   336-332-0824 °Services include: laundry, barbering, support groups, case management, phone  & computer access, showers, AA/NA mtgs, mental health/substance abuse nurse, job skills class, disability information, VA assistance, spiritual classes, etc.  ° °HOMELESS SHELTERS ° °[facility]     °[facility]   °[address], [place]     °[phone]       °       °[facility] (women and children)       °[address] °[place], [place] [address] °336-275-0820 °[email or web address] for application and process °Application Required ° °[facility]   °[address]    °[place] [address]     °[phone]       °             °[facility] °[address] °[place], [place] [address] °336.273.5572 °336-235-0363(schedule application appt.) °Application Required ° °[facility] (women only)    °[address]     °[place],  [address]     °336-884-1039      °  Intake starts 6pm daily °Need valid ID, SSC, & Police report °[facility] [place] °[address] °[place], [place] °336-881-5420 °Application Required ° °[facility] (men only)     °[address]      °[place], [place]     °[phone]      ° °[facility] °(Pregnant women only) °[address] °[place], [place] °336-275-0206 ° °[facility]      °[address]      °[place], [place] [address]     °[phone]      °       °[facility] °[address] °[place], [place] °336-723-1848 °90 day commitment/SA/Application process ° °[facility](men only)     °[address]     °[place], [place]     °[phone]       °Check-in at 7pm     °       °[facility] °[address] °[place],  [address] °336-248-6684 °Men/Women/Women and Children must be there by 7 pm ° °[facility] °[place],  °336-722-8721                ° °
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
## 2017-05-15 ENCOUNTER — Ambulatory Visit: Payer: Medicare Other | Admitting: Internal Medicine

## 2017-05-16 ENCOUNTER — Ambulatory Visit (HOSPITAL_COMMUNITY): Payer: Medicare Other | Attending: Cardiology

## 2017-05-16 ENCOUNTER — Other Ambulatory Visit: Payer: Self-pay

## 2017-05-16 ENCOUNTER — Encounter: Payer: Self-pay | Admitting: Internal Medicine

## 2017-05-16 ENCOUNTER — Ambulatory Visit (INDEPENDENT_AMBULATORY_CARE_PROVIDER_SITE_OTHER): Payer: Medicare Other | Admitting: Internal Medicine

## 2017-05-16 VITALS — BP 134/70 | HR 62 | Temp 98.3°F | Ht <= 58 in | Wt 139.0 lb

## 2017-05-16 DIAGNOSIS — I069 Rheumatic aortic valve disease, unspecified: Secondary | ICD-10-CM | POA: Insufficient documentation

## 2017-05-16 DIAGNOSIS — I5022 Chronic systolic (congestive) heart failure: Secondary | ICD-10-CM | POA: Diagnosis not present

## 2017-05-16 DIAGNOSIS — F419 Anxiety disorder, unspecified: Secondary | ICD-10-CM | POA: Diagnosis not present

## 2017-05-16 DIAGNOSIS — I5030 Unspecified diastolic (congestive) heart failure: Secondary | ICD-10-CM | POA: Diagnosis not present

## 2017-05-16 DIAGNOSIS — F22 Delusional disorders: Secondary | ICD-10-CM | POA: Diagnosis not present

## 2017-05-16 DIAGNOSIS — I420 Dilated cardiomyopathy: Secondary | ICD-10-CM | POA: Insufficient documentation

## 2017-05-16 LAB — ECHOCARDIOGRAM COMPLETE
HEIGHTINCHES: 58 in
WEIGHTICAEL: 2224 [oz_av]

## 2017-05-16 MED ORDER — ALPRAZOLAM 0.5 MG PO TABS: ORAL_TABLET | ORAL | 3 refills | Status: DC

## 2017-05-16 MED ORDER — OLANZAPINE 5 MG PO TABS
5.0000 mg | ORAL_TABLET | Freq: Every day | ORAL | 3 refills | Status: DC
Start: 2017-05-16 — End: 2017-07-05

## 2017-05-16 MED ORDER — OLANZAPINE 5 MG PO TABS: 5.0000 mg | ORAL_TABLET | Freq: Every day | ORAL | 1 refills | Status: DC

## 2017-05-16 NOTE — Patient Instructions (Addendum)
Increase olanzapine to 5mg  at bedtime for anxiety  Continue other medications as ordered  Follow up next week as scheduled
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
## 2017-05-22 ENCOUNTER — Encounter: Payer: Self-pay | Admitting: Internal Medicine

## 2017-05-22 ENCOUNTER — Ambulatory Visit (INDEPENDENT_AMBULATORY_CARE_PROVIDER_SITE_OTHER): Payer: Medicare Other | Admitting: Internal Medicine

## 2017-05-22 VITALS — BP 118/78 | HR 68 | Temp 98.5°F | Ht <= 58 in | Wt 143.2 lb

## 2017-05-22 DIAGNOSIS — I872 Venous insufficiency (chronic) (peripheral): Secondary | ICD-10-CM

## 2017-05-22 DIAGNOSIS — E2839 Other primary ovarian failure: Secondary | ICD-10-CM

## 2017-05-22 DIAGNOSIS — I5022 Chronic systolic (congestive) heart failure: Secondary | ICD-10-CM

## 2017-05-22 DIAGNOSIS — I10 Essential (primary) hypertension: Secondary | ICD-10-CM | POA: Diagnosis not present

## 2017-05-22 DIAGNOSIS — F22 Delusional disorders: Secondary | ICD-10-CM

## 2017-05-22 DIAGNOSIS — I890 Lymphedema, not elsewhere classified: Secondary | ICD-10-CM | POA: Diagnosis not present

## 2017-05-22 DIAGNOSIS — G8929 Other chronic pain: Secondary | ICD-10-CM

## 2017-05-22 DIAGNOSIS — J449 Chronic obstructive pulmonary disease, unspecified: Secondary | ICD-10-CM | POA: Diagnosis not present

## 2017-05-22 DIAGNOSIS — M2550 Pain in unspecified joint: Secondary | ICD-10-CM

## 2017-05-22 DIAGNOSIS — J4489 Other specified chronic obstructive pulmonary disease: Secondary | ICD-10-CM

## 2017-05-22 MED ORDER — ZOSTER VAC RECOMB ADJUVANTED 50 MCG/0.5ML IM SUSR: 0.5000 mL | Freq: Once | INTRAMUSCULAR | 1 refills | Status: AC

## 2017-05-22 NOTE — Patient Instructions (Addendum)
May need to increase zyprexa dose- please have [name] call office to discuss  [place] for management of lower extremity swelling and venous stasis changes  Will have Platea call you regarding topical pain medication to use as directed.   Continue other medications as ordered  Keep legs elevated when seated  Follow up in 1 month for mood, leg edema

## 2017-05-22 NOTE — Progress Notes (Signed)
Patient ID: [name], female   DOB: [birth year], 83 y.o.   MRN: [id number]    Location:  [facility] Place of Service: OFFICE  Chief Complaint  Patient presents with  . Follow-up    1 month follow up for edma, patient is here with nieces X3    HPI:  83 yo female seen today for f/u lymphedema. She has not been back to the lymphedema clinic. She keeps legs elevated at night and swelling improves a little. She does not wear TED stockings. She did go for her 2D echo last week but has not rec'd results yet. Neuropathy pain stable. She uses HFA at least daily. No asthma attacks. She is a poor historian due to psych d/o. Hx obtained from chart and nieces. She still has issues with stepson, [name].  HTN - BP stable on coreg, hydralazine.  Chronic systolic HF - stable on coreg, torsemide + potassium and hydralazine. Followed by cardio Dr Daneen Schick. EF 35-40% by 2D echo in 2015. echo also revealed septal wall akinesis/severe hypokinesis; mod-sev diffuse hypokinesis. She recently had repeat study  Asthmatic bronchitis/chronic obstructive bronchitis- stable on advair, theo-24, prn proair HFA  Peripheral neuropathy - stable on gabapentin. Followed by neurology Dr Tomi Likens  Venous insufficiency/chronic LE lymphedema - stable on torsemide, clotrimazole/betamethasone cream, silvadene cream  CKD - stage 4. Cr 1.49  Anemia of chronic disease/MGUS - stable on iron supplement. Hgb 10.8; IgG 2600/IgA 408 in Apr 2017  Psychosis/ hallucinations - no behavioral disturbance and no formal psych evaluation. Family not willing for counseling at this time. She lives alone and gets meals on wheels. family stays with her at night most days of the week and she gets some PCS services. She reports she is taking zyprexa 63m qhs  Chronic LBP/generalized joint pain  - uncontrolled on topical agents. She has tried SF[employer] B[employer] bankerand Thera pain without relief. She is due for DXA scan     Past Medical History:  Diagnosis  Date  . Anemia, unspecified   . Anxiety   . Arthritis   . Asthma   . Delusions ([facility]   . Depression   . Edema 2005   "since put the glass in them"   . GERD (gastroesophageal reflux disease)   . Lumbago   . Lymphedema   . Obstructive chronic bronchitis without exacerbation ([facility]   . Other abnormal glucose   . 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[place] [place] 04/04/2013  . Systolic heart failure (HCC)    EF 35 to 40% per echo January 2015  . Unspecified essential hypertension   . Unspecified nonpsychotic mental disorder   . Unspecified venous (peripheral) insufficiency   . Unspecified venous (peripheral) insufficiency   . Unspecified vitamin D deficiency     Past Surgical History:  Procedure Laterality Date  . APPENDECTOMY    . CERVICAL SPINE SURGERY     x 2  . CHOLECYSTECTOMY    . ESOPHAGOGASTRODUODENOSCOPY N/A 04/05/2015   Procedure: ESOPHAGOGASTRODUODENOSCOPY (EGD);  Surgeon: MLadene Artist MD;  [facility];  Service: Endoscopy;  Laterality: N/A;  . FLEXIBLE SIGMOIDOSCOPY N/A 04/05/2015   Procedure: FLEXIBLE SIGMOIDOSCOPY;  Surgeon: MLadene Artist MD;  Location[facility];  Service: Endoscopy;  Laterality: N/A;  . HAND SURGERY    . LUMBAR DISC SURGERY    . PARTIAL HYSTERECTOMY    . TONSILLECTOMY      Patient Care Team: CGildardo Cranker DO as PCP - General (Internal Medicine) NNoralee Space MD (Pulmonary Disease) TJacolyn Reedy MD  as Consulting Physician (Cardiology) Pieter Partridge, DO as Consulting Physician (Neurology)  Social History   Social History  . Marital status: Widowed    Spouse name: N/A  . Number of children: 0  . Years of education: N/A   Occupational History  . retired    Social History Main Topics  . Smoking status: Never Smoker  . Smokeless tobacco: Never Used  . Alcohol use No  . Drug use: No  . Sexual activity: Not Currently   Other Topics Concern  . Not on file   Social History Narrative   Diet? Good, low appetite      Do you drink/eat  things with caffeine? no      Marital status?                  widowed                  What year were you married?      Do you live in a house, apartment, assisted living, condo, trailer, etc.? house      Is it one or more stories? 1 story      How many persons live in your home? Only me      Do you have any pets in your home? (please list) no      Current or past profession: lab tech      Do you exercise?        some                              Type & how often? Foot rolls, some walking      Do you have a living will? yes      Do you have a DNR form?    no                              If not, do you want to discuss one?      Do you have signed POA/HPOA for forms? yes        reports that she has never smoked. She has never used smokeless tobacco. She reports that she does not drink alcohol or use drugs.  Family History  Problem Relation Age of Onset  . Diabetes Mother   . Arthritis Father   . Diabetes Paternal Grandfather        entire family on both sides  . Heart disease Maternal Aunt        entire family of both sides  . Cancer Paternal Aunt        type unknown  . Diabetes Sister    Family Status  Relation Status  . Mother Deceased  . Father Deceased  . MGM Deceased  . MGF Deceased  . PGM Deceased  . PGF Deceased  . Mat Aunt (Not Specified)  . Ethlyn Daniels (Not Specified)  . Sister Alive  . Brother Alive  . Sister Alive  . Brother Alive  . Sister Deceased     Allergies  Allergen Reactions  . Aspirin Other (See Comments)    REACTION: upset stomach  . Codeine Other (See Comments)    REACTION: "MAKES MY BODY GO CRAZY; CRAMPS"  . Latex Nausea And Vomiting    Medications: Patient's Medications  New Prescriptions   No medications on file  Previous Medications   ACETAMINOPHEN (TYLENOL 8 HOUR) 650  MG CR TABLET    Take 1 tablet (650 mg total) by mouth every 8 (eight) hours as needed for pain.   ALBUTEROL (PROAIR HFA) 108 (90 BASE) MCG/ACT INHALER     INHALE 2 PUFFS EVERY 4 HOURS AS NEEDED FOR WHEEZING   ALPRAZOLAM (XANAX) 0.5 MG TABLET    Take 1/2 to 1 tablet by mouth three times daily as needed for nerves   ASCORBIC ACID (VITAMIN C) 1000 MG TABLET    Take 2,000 mg by mouth daily.   CARVEDILOL (COREG) 6.25 MG TABLET    TAKE 1 TABLET BY MOUTH TWICE DAILY   CLOTRIMAZOLE-BETAMETHASONE (LOTRISONE) CREAM    Apply 1 application topically 2 (two) times daily. To your legs   DIPHENHYD-HYDROCORT-NYSTATIN (FIRST-DUKES MOUTHWASH) SUSP    1 tsp gargle and swallow three times daily as needed   FERROUS SULFATE DRIED 200 (65 FE) MG TABS    Take 1 tablet by mouth 2 (two) times daily.   FLUTICASONE-SALMETEROL (ADVAIR HFA) 115-21 MCG/ACT INHALER    USE 2 INHALATIONS TWICE A DAY   GABAPENTIN (NEURONTIN) 100 MG CAPSULE    Take 2 capsules by mouth in the morning and 2 capsules by mouth at bedtime   HYDRALAZINE (APRESOLINE) 25 MG TABLET    Take 25 mg by mouth 2 (two) times daily.   OLANZAPINE (ZYPREXA) 5 MG TABLET    Take 1 tablet (5 mg total) by mouth at bedtime.   OLANZAPINE (ZYPREXA) 5 MG TABLET    Take 1 tablet (5 mg total) by mouth at bedtime.   ONDANSETRON (ZOFRAN) 4 MG TABLET    Take 1 tablet (4 mg total) by mouth every 8 (eight) hours as needed for nausea or vomiting.   POTASSIUM CHLORIDE SA (K-DUR,KLOR-CON) 20 MEQ TABLET    Take 2 tablets (40 mEq total) by mouth once.   SILVER SULFADIAZINE (SILVADENE) 1 % CREAM    Apply 1 application topically daily.   TORSEMIDE (DEMADEX) 20 MG TABLET    Take 3 tablets (60 mg total) by mouth 2 (two) times daily.   TRAMADOL (ULTRAM) 50 MG TABLET    Take 1 tablet (50 mg total) by mouth 3 (three) times daily as needed.   VITAMIN D, ERGOCALCIFEROL, (DRISDOL) 50000 UNITS CAPS CAPSULE    Take 50,000 Units by mouth every 7 (seven) days.  Modified Medications   Modified Medication Previous Medication   ZOSTER VAC RECOMB ADJUVANTED (SHINGRIX) INJECTION Zoster Vac Recomb Adjuvanted (SHINGRIX) injection      Inject 0.5 mLs into the  muscle once.    Inject 0.5 mLs into the muscle once.  Discontinued Medications   No medications on file    Review of Systems  Unable to perform ROS: Psychiatric disorder    Vitals:   05/22/17 1537  BP: 118/78  Pulse: 68  Temp: 98.5 F (36.9 C)  TempSrc: Oral  SpO2: 99%  Weight: 143 lb 3.2 oz (65 kg)  Height: 4' 10" (1.473 m)   Body mass index is 29.93 kg/m.  Physical Exam  Constitutional: She appears well-developed and well-nourished.    [place], [place] and [place] also present  HENT:  Mouth/Throat: Oropharynx is clear and moist. No oropharyngeal exudate.  MMM; no oral thrush  Eyes: Pupils are equal, round, and reactive to light. No scleral icterus.  Neck: Neck supple. Carotid bruit is not present.  Cardiovascular: Normal rate, regular rhythm and intact distal pulses.  Exam reveals no gallop and no friction rub.   Murmur (1/6 SEM) heard. Marked  L>RLE lymphedema with chronic venous stasis changes. No calf TTP. +2 pitting LE edema L>R  Pulmonary/Chest: Effort normal and breath sounds normal. No stridor. No respiratory distress. She has no wheezes. She has no rales. She exhibits no tenderness.  Reduced BS L>R base  Abdominal: Soft. Normal appearance and bowel sounds are normal. She exhibits no distension and no mass. There is no hepatomegaly. There is tenderness (LLQ). There is no rigidity, no rebound and no guarding. No hernia.  Musculoskeletal: She exhibits edema and deformity (small and large joint deformities).  Lymphadenopathy:    She has no cervical adenopathy.  Neurological: She is alert.  Skin: Skin is warm and dry. Rash (stasis dermatitis but no weeping or open sores) noted.  Psychiatric: She has a normal mood and affect. Her speech is normal and behavior is normal. Thought content is paranoid and delusional. Cognition and memory are impaired. She expresses inappropriate judgment.     Labs reviewed: Appointment on 05/16/2017  Component Date Value Ref  Range Status  . Weight 05/16/2017 2224  oz Final  . Height 05/16/2017 58.000  in Final  . BP 05/16/2017 134/70  mmHg Final  Admission on 05/12/2017, Discharged on 05/14/2017  Component Date Value Ref Range Status  . WBC 05/12/2017 5.1  4.0 - 10.5 K/uL Final  . RBC 05/12/2017 3.44* 3.87 - 5.11 MIL/uL Final  . Hemoglobin 05/12/2017 9.2* 12.0 - 15.0 g/dL Final  . HCT 05/12/2017 29.1* 36.0 - 46.0 % Final  . MCV 05/12/2017 84.6  78.0 - 100.0 fL Final  . MCH 05/12/2017 26.7  26.0 - 34.0 pg Final  . MCHC 05/12/2017 31.6  30.0 - 36.0 g/dL Final  . RDW 05/12/2017 16.6* 11.5 - 15.5 % Final  . Platelets 05/12/2017 202  150 - 400 K/uL Final  . Sodium 05/12/2017 137  135 - 145 mmol/L Final  . Potassium 05/12/2017 4.6  3.5 - 5.1 mmol/L Final  . Chloride 05/12/2017 103  101 - 111 mmol/L Final  . CO2 05/12/2017 26  22 - 32 mmol/L Final  . Glucose, Bld 05/12/2017 74  65 - 99 mg/dL Final  . BUN 05/12/2017 30* 6 - 20 mg/dL Final  . Creatinine, Ser 05/12/2017 1.49* 0.44 - 1.00 mg/dL Final  . Calcium 05/12/2017 9.7  8.9 - 10.3 mg/dL Final  . Total Protein 05/12/2017 8.6* 6.5 - 8.1 g/dL Final  . Albumin 05/12/2017 3.6  3.5 - 5.0 g/dL Final  . AST 05/12/2017 20  15 - 41 U/L Final  . ALT 05/12/2017 12* 14 - 54 U/L Final  . Alkaline Phosphatase 05/12/2017 59  38 - 126 U/L Final  . Total Bilirubin 05/12/2017 0.6  0.3 - 1.2 mg/dL Final  . GFR calc non Af Amer 05/12/2017 31* >60 mL/min Final  . GFR calc Af Amer 05/12/2017 36* >60 mL/min Final   Comment: (NOTE) The eGFR has been calculated using the CKD EPI equation. This calculation has not been validated in all clinical situations. eGFR's persistently <60 mL/min signify possible Chronic Kidney Disease.   . Anion gap 05/12/2017 8  5 - 15 Final  . Alcohol, Ethyl (B) 05/12/2017 <5  <5 mg/dL Final   Comment:        LOWEST DETECTABLE LIMIT FOR SERUM ALCOHOL IS 5 mg/dL FOR MEDICAL PURPOSES ONLY   . Opiates 05/12/2017 NONE DETECTED  NONE DETECTED Final  .  Cocaine 05/12/2017 NONE DETECTED  NONE DETECTED Final  . Benzodiazepines 05/12/2017 NONE DETECTED  NONE DETECTED Final  . Amphetamines 05/12/2017  NONE DETECTED  NONE DETECTED Final  . Tetrahydrocannabinol 05/12/2017 NONE DETECTED  NONE DETECTED Final  . Barbiturates 05/12/2017 NONE DETECTED  NONE DETECTED Final   Comment:        DRUG SCREEN FOR MEDICAL PURPOSES ONLY.  IF CONFIRMATION IS NEEDED FOR ANY PURPOSE, NOTIFY LAB WITHIN 5 DAYS.        LOWEST DETECTABLE LIMITS FOR URINE DRUG SCREEN Drug Class       Cutoff (ng/mL) Amphetamine      1000 Barbiturate      200 Benzodiazepine   409 Tricyclics       811 Opiates          300 Cocaine          300 THC              50   . Color, Urine 05/12/2017 YELLOW  YELLOW Final  . APPearance 05/12/2017 CLEAR  CLEAR Final  . Specific Gravity, Urine 05/12/2017 1.010  1.005 - 1.030 Final  . pH 05/12/2017 8.0  5.0 - 8.0 Final  . Glucose, UA 05/12/2017 NEGATIVE  NEGATIVE mg/dL Final  . Hgb urine dipstick 05/12/2017 NEGATIVE  NEGATIVE Final  . Bilirubin Urine 05/12/2017 NEGATIVE  NEGATIVE Final  . Ketones, ur 05/12/2017 NEGATIVE  NEGATIVE mg/dL Final  . Protein, ur 05/12/2017 NEGATIVE  NEGATIVE mg/dL Final  . Nitrite 05/12/2017 NEGATIVE  NEGATIVE Final  . Leukocytes, UA 05/12/2017 TRACE* NEGATIVE Final  . RBC / HPF 05/12/2017 0-5  0 - 5 RBC/hpf Final  . WBC, UA 05/12/2017 0-5  0 - 5 WBC/hpf Final  . Bacteria, UA 05/12/2017 RARE* NONE SEEN Final  . Squamous Epithelial / LPF 05/12/2017 0-5* NONE SEEN Final  Office Visit on 04/20/2017  Component Date Value Ref Range Status  . Sodium 04/20/2017 137  135 - 146 mmol/L Final  . Potassium 04/20/2017 3.5  3.5 - 5.3 mmol/L Final  . Chloride 04/20/2017 100  98 - 110 mmol/L Final  . CO2 04/20/2017 27  20 - 31 mmol/L Final  . Glucose, Bld 04/20/2017 130* 65 - 99 mg/dL Final  . BUN 04/20/2017 31* 7 - 25 mg/dL Final  . Creat 04/20/2017 1.49* 0.60 - 0.88 mg/dL Final   Comment:   For patients > or = 81 years  of age: The upper reference limit for Creatinine is approximately 13% higher for people identified as African-American.     . Total Bilirubin 04/20/2017 0.3  0.2 - 1.2 mg/dL Final  . Alkaline Phosphatase 04/20/2017 65  33 - 130 U/L Final  . AST 04/20/2017 14  10 - 35 U/L Final  . ALT 04/20/2017 9  6 - 29 U/L Final  . Total Protein 04/20/2017 8.4* 6.1 - 8.1 g/dL Final  . Albumin 04/20/2017 3.6  3.6 - 5.1 g/dL Final  . Calcium 04/20/2017 9.3  8.6 - 10.4 mg/dL Final  . GFR, Est African American 04/20/2017 37* >=60 mL/min Final  . GFR, Est Non African American 04/20/2017 32* >=60 mL/min Final    No results found.   Assessment/Plan   ICD-10-CM   1. Chronic pain of multiple joints M25.50    G89.29   2. Lymphedema of both lower extremities I89.0   3. Paranoid delusion ([place]) F22   4. Venous (peripheral) insufficiency I87.2   5. Chronic systolic heart failure (HCC) I50.22   6. Essential hypertension I10   7. Obstructive chronic bronchitis ([place]) J44.9   8. Estrogen deficiency E28.39 DG Bone Density    Start cmpd topical  pain med (BCDL) as directed  May need to increase zyprexa dose- please have [name] call office to discuss  [place] for management of lower extremity swelling and venous stasis changes  Continue other medications as ordered  Keep legs elevated when seated  Follow up in 1 month for mood, leg edema   S. Perlie Gold  [facility] [address] [place], [place] [id number] [phone] Cell (Monday-Friday 8 AM - 5 PM) [phone] After 5 PM and follow prompts

## 2017-06-19 ENCOUNTER — Inpatient Hospital Stay: Admission: RE | Admit: 2017-06-19 | Payer: Self-pay | Source: Ambulatory Visit

## 2017-06-26 ENCOUNTER — Ambulatory Visit: Payer: Medicare Other | Admitting: Internal Medicine

## 2017-07-03 ENCOUNTER — Encounter: Payer: Self-pay | Admitting: Internal Medicine

## 2017-07-03 ENCOUNTER — Ambulatory Visit (INDEPENDENT_AMBULATORY_CARE_PROVIDER_SITE_OTHER): Payer: Medicare Other | Admitting: Internal Medicine

## 2017-07-03 DIAGNOSIS — R110 Nausea: Secondary | ICD-10-CM

## 2017-07-03 DIAGNOSIS — I5022 Chronic systolic (congestive) heart failure: Secondary | ICD-10-CM | POA: Diagnosis not present

## 2017-07-03 DIAGNOSIS — Z23 Encounter for immunization: Secondary | ICD-10-CM | POA: Diagnosis not present

## 2017-07-03 DIAGNOSIS — I890 Lymphedema, not elsewhere classified: Secondary | ICD-10-CM

## 2017-07-03 DIAGNOSIS — F22 Delusional disorders: Secondary | ICD-10-CM

## 2017-07-03 NOTE — Progress Notes (Signed)
Patient ID: [name], female   DOB: [birth year], 83 y.o.   MRN: [id number]    Location:  [facility] Place of Service: OFFICE  Chief Complaint  Patient presents with  . Follow-up    Pt is being seen for a 1 month follow up on mood and leg edema.     HPI:  83 yo female seen today for f/u hallucinations and lymphedema.  She c/o "pain all over". She takes tramadol and gabapentin. She reports gabapentin "comes back up" after taking it. [name] unaware of vomiting. She has lost 4 lbs since last ov. [name] reports her condition has not changed overall. She needs cat sx but is hesitant to pursue.  She has not been back to the lymphedema clinic yet. She keeps legs elevated at night and swelling improves a little. She does not wear TED stockings. 2D echo revealed EF 55-60%. She is a poor historian due to psych d/o. Hx obtained from chart and nieces. She still has issues with stepson, [name].  HTN - BP stable on coreg, hydralazine.  Chronic systolic HF - stable on coreg, torsemide + potassium and hydralazine. Followed by cardio Dr Daneen Schick. EF 55-60% (EF 35-40% by 2D echo in 2015). Prior echo also revealed septal wall akinesis/severe hypokinesis; mod-sev diffuse hypokinesis.  Asthmatic bronchitis/chronic obstructive bronchitis- stable on advair, theo-24, prn proair HFA  Peripheral neuropathy - stable on gabapentin. Followed by neurology Dr Tomi Likens  Venous insufficiency/chronic LE lymphedema - stable on torsemide, clotrimazole/betamethasone cream, silvadene cream  CKD - stage 4. Cr 1.49  Anemia of chronic disease/MGUS - stable on iron supplement. Hgb 9.2; IgG 2600/IgA 408 in Apr 2017  Psychosis/ hallucinations - unchanged. no behavioral disturbance and no formal psych evaluation. Family not willing for counseling at this time. She lives alone and gets meals on wheels. family stays with her at night most days of the week and she gets some PCS services. She reports she is taking zyprexa '5mg'$  qhs  Chronic  LBP/generalized joint pain  - uncontrolled. Takes tramadol and applies topical agents. 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 She is scheduled for DXA scan on September 6th 2018   Past Medical History:  Diagnosis Date  . Anemia, unspecified   . Anxiety   . Arthritis   . Asthma   . Delusions ([place])   . Depression   . Edema 2005   "since put the glass in them"   . GERD (gastroesophageal reflux disease)   . Lumbago   . Lymphedema   . Obstructive chronic bronchitis without exacerbation ([place])   . Other abnormal glucose   . 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 Systolic heart failure (HCC)    EF 35 to 40% per echo January 2015  . Unspecified essential hypertension   . Unspecified nonpsychotic mental disorder   . Unspecified venous (peripheral) insufficiency   . Unspecified venous (peripheral) insufficiency   . Unspecified vitamin D deficiency     Past Surgical History:  Procedure Laterality Date  . APPENDECTOMY    . CERVICAL SPINE SURGERY     x 2  . CHOLECYSTECTOMY    . ESOPHAGOGASTRODUODENOSCOPY N/A 04/05/2015   Procedure: ESOPHAGOGASTRODUODENOSCOPY (EGD);  Surgeon: [name], MD;  Location: [facility];  Service: Endoscopy;  Laterality: N/A;  . FLEXIBLE SIGMOIDOSCOPY N/A 04/05/2015   Procedure: FLEXIBLE SIGMOIDOSCOPY;  Surgeon: [name], MD;  Location: [facility];  Service: Endoscopy;  Laterality: N/A;  . HAND SURGERY    . LUMBAR DISC  SURGERY    . PARTIAL HYSTERECTOMY    . TONSILLECTOMY      Patient Care Team: Gildardo Cranker, DO as PCP - General (Internal Medicine) [name], MD (Pulmonary Disease) Jacolyn Reedy, MD as Consulting Physician (Cardiology) Pieter Partridge, DO as Consulting Physician (Neurology)  Social History   Social History  . Marital status: Widowed    Spouse name: N/A  . Number of children: 0  . Years of education: N/A   Occupational History  . retired    Social History Main Topics  . Smoking status:  Never Smoker  . Smokeless tobacco: Never Used  . Alcohol use No  . Drug use: No  . Sexual activity: Not Currently   Other Topics Concern  . Not on file   Social History Narrative   Diet? Good, low appetite      Do you drink/eat things with caffeine? no      Marital status?                  widowed                  What year were you married?      Do you live in a house, apartment, assisted living, condo, trailer, etc.? house      Is it one or more stories? 1 story      How many persons live in your home? Only me      Do you have any pets in your home? (please list) no      Current or past profession: lab tech      Do you exercise?        some                              Type & how often? Foot rolls, some walking      Do you have a living will? yes      Do you have a DNR form?    no                              If not, do you want to discuss one?      Do you have signed POA/HPOA for forms? yes        reports that she has never smoked. She has never used smokeless tobacco. She reports that she does not drink alcohol or use drugs.  Family History  Problem Relation Age of Onset  . Diabetes Mother   . Arthritis Father   . Diabetes Paternal Grandfather        entire family on both sides  . Heart disease Maternal Aunt        entire family of both sides  . Cancer Paternal Aunt        type unknown  . Diabetes Sister    Family Status  Relation Status  . Mother Deceased  . Father Deceased  . MGM Deceased  . MGF Deceased  . PGM Deceased  . PGF Deceased  . Mat Aunt (Not Specified)  . Ethlyn Daniels (Not Specified)  . Sister Alive  . Brother Alive  . Sister Alive  . Brother Alive  . Sister Deceased     Allergies  Allergen Reactions  . Aspirin Other (See Comments)    REACTION: upset stomach  . Codeine Other (See Comments)  REACTION: "MAKES MY BODY GO CRAZY; CRAMPS"  . Latex Nausea And Vomiting    Medications: Patient's Medications  New Prescriptions   No  medications on file  Previous Medications   ACETAMINOPHEN (TYLENOL 8 HOUR) 650 MG CR TABLET    Take 1 tablet (650 mg total) by mouth every 8 (eight) hours as needed for pain.   ALBUTEROL (PROAIR HFA) 108 (90 BASE) MCG/ACT INHALER    INHALE 2 PUFFS EVERY 4 HOURS AS NEEDED FOR WHEEZING   ALPRAZOLAM (XANAX) 0.5 MG TABLET    Take 1/2 to 1 tablet by mouth three times daily as needed for nerves   ASCORBIC ACID (VITAMIN C) 1000 MG TABLET    Take 2,000 mg by mouth daily.   CARVEDILOL (COREG) 6.25 MG TABLET    TAKE 1 TABLET BY MOUTH TWICE DAILY   CLOTRIMAZOLE-BETAMETHASONE (LOTRISONE) CREAM    Apply 1 application topically 2 (two) times daily. To your legs   DIPHENHYD-HYDROCORT-NYSTATIN (FIRST-DUKES MOUTHWASH) SUSP    1 tsp gargle and swallow three times daily as needed   FERROUS SULFATE DRIED 200 (65 FE) MG TABS    Take 1 tablet by mouth 2 (two) times daily.   FLUTICASONE-SALMETEROL (ADVAIR HFA) 115-21 MCG/ACT INHALER    USE 2 INHALATIONS TWICE A DAY   GABAPENTIN (NEURONTIN) 100 MG CAPSULE    Take 2 capsules by mouth in the morning and 2 capsules by mouth at bedtime   HYDRALAZINE (APRESOLINE) 25 MG TABLET    Take 25 mg by mouth 2 (two) times daily.   OLANZAPINE (ZYPREXA) 5 MG TABLET    Take 1 tablet (5 mg total) by mouth at bedtime.   ONDANSETRON (ZOFRAN) 4 MG TABLET    Take 1 tablet (4 mg total) by mouth every 8 (eight) hours as needed for nausea or vomiting.   POTASSIUM CHLORIDE SA (K-DUR,KLOR-CON) 20 MEQ TABLET    Take 2 tablets (40 mEq total) by mouth once.   SILVER SULFADIAZINE (SILVADENE) 1 % CREAM    Apply 1 application topically daily.   TORSEMIDE (DEMADEX) 20 MG TABLET    Take 3 tablets (60 mg total) by mouth 2 (two) times daily.   TRAMADOL (ULTRAM) 50 MG TABLET    Take 1 tablet (50 mg total) by mouth 3 (three) times daily as needed.   VITAMIN D, ERGOCALCIFEROL, (DRISDOL) 50000 UNITS CAPS CAPSULE    Take 50,000 Units by mouth every 7 (seven) days.  Modified Medications   No medications on file    Discontinued Medications   OLANZAPINE (ZYPREXA) 5 MG TABLET    Take 1 tablet (5 mg total) by mouth at bedtime.    Review of Systems  Unable to perform ROS: Psychiatric disorder (delusions)    Vitals:   07/03/17 1424  BP: 122/84  Pulse: 87  Resp: 17  Temp: 98.2 F (36.8 C)  TempSrc: Oral  SpO2: 98%  Weight: 139 lb 6.4 oz (63.2 kg)  Height: '4\' 10"'$  (1.473 m)   Body mass index is 29.13 kg/m.  Physical Exam  Cardiovascular: Normal rate, regular rhythm and intact distal pulses.  Exam reveals no gallop and no friction rub.   Murmur (1/6 SEM) heard. L>RLE +2 pitting lymphedema. No calf TTP; chronic venous stasis changes in LE b/l  Pulmonary/Chest: Effort normal and breath sounds normal. No respiratory distress. She has no wheezes. She has no rales.  Musculoskeletal: She exhibits edema.  Neurological: She is alert.  Skin: Skin is warm and dry. Rash (chronic venous stasis changes in  BLE) noted.  Psychiatric: She has a normal mood and affect. Her behavior is normal. Thought content is delusional. Cognition and memory are impaired.     Labs reviewed: Appointment on 05/16/2017  Component Date Value Ref Range Status  . Weight 05/16/2017 2224  oz Final  . Height 05/16/2017 58.000  in Final  . BP 05/16/2017 134/70  mmHg Final  Admission on 05/12/2017, Discharged on 05/14/2017  Component Date Value Ref Range Status  . WBC 05/12/2017 5.1  4.0 - 10.5 K/uL Final  . RBC 05/12/2017 3.44* 3.87 - 5.11 MIL/uL Final  . Hemoglobin 05/12/2017 9.2* 12.0 - 15.0 g/dL Final  . HCT 05/12/2017 29.1* 36.0 - 46.0 % Final  . MCV 05/12/2017 84.6  78.0 - 100.0 fL Final  . MCH 05/12/2017 26.7  26.0 - 34.0 pg Final  . MCHC 05/12/2017 31.6  30.0 - 36.0 g/dL Final  . RDW 05/12/2017 16.6* 11.5 - 15.5 % Final  . Platelets 05/12/2017 202  150 - 400 K/uL Final  . Sodium 05/12/2017 137  135 - 145 mmol/L Final  . Potassium 05/12/2017 4.6  3.5 - 5.1 mmol/L Final  . Chloride 05/12/2017 103  101 - 111 mmol/L  Final  . CO2 05/12/2017 26  22 - 32 mmol/L Final  . Glucose, Bld 05/12/2017 74  65 - 99 mg/dL Final  . BUN 05/12/2017 30* 6 - 20 mg/dL Final  . Creatinine, Ser 05/12/2017 1.49* 0.44 - 1.00 mg/dL Final  . Calcium 05/12/2017 9.7  8.9 - 10.3 mg/dL Final  . Total Protein 05/12/2017 8.6* 6.5 - 8.1 g/dL Final  . Albumin 05/12/2017 3.6  3.5 - 5.0 g/dL Final  . AST 05/12/2017 20  15 - 41 U/L Final  . ALT 05/12/2017 12* 14 - 54 U/L Final  . Alkaline Phosphatase 05/12/2017 59  38 - 126 U/L Final  . Total Bilirubin 05/12/2017 0.6  0.3 - 1.2 mg/dL Final  . GFR calc non Af Amer 05/12/2017 31* >60 mL/min Final  . GFR calc Af Amer 05/12/2017 36* >60 mL/min Final   Comment: (NOTE) The eGFR has been calculated using the CKD EPI equation. This calculation has not been validated in all clinical situations. eGFR's persistently <60 mL/min signify possible Chronic Kidney Disease.   . Anion gap 05/12/2017 8  5 - 15 Final  . Alcohol, Ethyl (B) 05/12/2017 <5  <5 mg/dL Final   Comment:        LOWEST DETECTABLE LIMIT FOR SERUM ALCOHOL IS 5 mg/dL FOR MEDICAL PURPOSES ONLY   . Opiates 05/12/2017 NONE DETECTED  NONE DETECTED Final  . Cocaine 05/12/2017 NONE DETECTED  NONE DETECTED Final  . Benzodiazepines 05/12/2017 NONE DETECTED  NONE DETECTED Final  . Amphetamines 05/12/2017 NONE DETECTED  NONE DETECTED Final  . Tetrahydrocannabinol 05/12/2017 NONE DETECTED  NONE DETECTED Final  . Barbiturates 05/12/2017 NONE DETECTED  NONE DETECTED Final   Comment:        DRUG SCREEN FOR MEDICAL PURPOSES ONLY.  IF CONFIRMATION IS NEEDED FOR ANY PURPOSE, NOTIFY LAB WITHIN 5 DAYS.        LOWEST DETECTABLE LIMITS FOR URINE DRUG SCREEN Drug Class       Cutoff (ng/mL) Amphetamine      1000 Barbiturate      200 Benzodiazepine   017 Tricyclics       510 Opiates          300 Cocaine          300 THC  50   . Color, Urine 05/12/2017 YELLOW  YELLOW Final  . APPearance 05/12/2017 CLEAR  CLEAR Final  .  Specific Gravity, Urine 05/12/2017 1.010  1.005 - 1.030 Final  . pH 05/12/2017 8.0  5.0 - 8.0 Final  . Glucose, UA 05/12/2017 NEGATIVE  NEGATIVE mg/dL Final  . Hgb urine dipstick 05/12/2017 NEGATIVE  NEGATIVE Final  . Bilirubin Urine 05/12/2017 NEGATIVE  NEGATIVE Final  . Ketones, ur 05/12/2017 NEGATIVE  NEGATIVE mg/dL Final  . Protein, ur 05/12/2017 NEGATIVE  NEGATIVE mg/dL Final  . Nitrite 05/12/2017 NEGATIVE  NEGATIVE Final  . Leukocytes, UA 05/12/2017 TRACE* NEGATIVE Final  . RBC / HPF 05/12/2017 0-5  0 - 5 RBC/hpf Final  . WBC, UA 05/12/2017 0-5  0 - 5 WBC/hpf Final  . Bacteria, UA 05/12/2017 RARE* NONE SEEN Final  . Squamous Epithelial / LPF 05/12/2017 0-5* NONE SEEN Final  Office Visit on 04/20/2017  Component Date Value Ref Range Status  . Sodium 04/20/2017 137  135 - 146 mmol/L Final  . Potassium 04/20/2017 3.5  3.5 - 5.3 mmol/L Final  . Chloride 04/20/2017 100  98 - 110 mmol/L Final  . CO2 04/20/2017 27  20 - 31 mmol/L Final  . Glucose, Bld 04/20/2017 130* 65 - 99 mg/dL Final  . BUN 04/20/2017 31* 7 - 25 mg/dL Final  . Creat 04/20/2017 1.49* 0.60 - 0.88 mg/dL Final   Comment:   For patients > or = 81 years of age: The upper reference limit for Creatinine is approximately 13% higher for people identified as African-American.     . Total Bilirubin 04/20/2017 0.3  0.2 - 1.2 mg/dL Final  . Alkaline Phosphatase 04/20/2017 65  33 - 130 U/L Final  . AST 04/20/2017 14  10 - 35 U/L Final  . ALT 04/20/2017 9  6 - 29 U/L Final  . Total Protein 04/20/2017 8.4* 6.1 - 8.1 g/dL Final  . Albumin 04/20/2017 3.6  3.6 - 5.1 g/dL Final  . Calcium 04/20/2017 9.3  8.6 - 10.4 mg/dL Final  . GFR, Est African American 04/20/2017 37* >=60 mL/min Final  . GFR, Est Non African American 04/20/2017 32* >=60 mL/min Final    No results found.   Assessment/Plan   ICD-10-CM   1. Paranoid delusion ([place]) F22   2. Chronic systolic heart failure (HCC) I50.22   3. Lymphedema of both lower  extremities I89.0   4. Nausea R11.0   5. Need for immunization against influenza Z23 Flu Vaccine QUAD 36+ mos IM     Encourage her to proceed with cat sx to improve QOL  Continue current medications as ordered  Follow up for bone density as scheduled  Flu shot given today  Follow up in 1 month for delusions, lymphedema  Odies Desa S. Perlie Gold  [facility] [address] [place], [place] [address] [phone] Cell (Monday-Friday 8 AM - 5 PM) [phone] After 5 PM and follow prompts

## 2017-07-03 NOTE — Patient Instructions (Addendum)
Continue current medications as ordered  Follow up with eye specialist for cataract surgery  Follow up for bone density as scheduled  Flu shot given today  Follow up in 1 month for delusions, lymphedema

## 2017-07-05 ENCOUNTER — Other Ambulatory Visit: Payer: Self-pay | Admitting: Internal Medicine

## 2017-07-05 ENCOUNTER — Telehealth: Payer: Self-pay | Admitting: *Deleted

## 2017-07-05 ENCOUNTER — Ambulatory Visit
Admission: RE | Admit: 2017-07-05 | Discharge: 2017-07-05 | Disposition: A | Discharge status: Home / Self Care | Payer: Medicare Other | Source: Ambulatory Visit | Attending: Internal Medicine | Admitting: Internal Medicine

## 2017-07-05 DIAGNOSIS — I890 Lymphedema, not elsewhere classified: Secondary | ICD-10-CM

## 2017-07-05 DIAGNOSIS — Z1239 Encounter for other screening for malignant neoplasm of breast: Secondary | ICD-10-CM

## 2017-07-05 DIAGNOSIS — Z780 Asymptomatic menopausal state: Secondary | ICD-10-CM | POA: Diagnosis not present

## 2017-07-05 DIAGNOSIS — E2839 Other primary ovarian failure: Secondary | ICD-10-CM

## 2017-07-05 DIAGNOSIS — I5022 Chronic systolic (congestive) heart failure: Secondary | ICD-10-CM

## 2017-07-05 DIAGNOSIS — Z1231 Encounter for screening mammogram for malignant neoplasm of breast: Secondary | ICD-10-CM | POA: Diagnosis not present

## 2017-07-05 DIAGNOSIS — F22 Delusional disorders: Secondary | ICD-10-CM

## 2017-07-05 DIAGNOSIS — M810 Age-related osteoporosis without current pathological fracture: Secondary | ICD-10-CM | POA: Diagnosis not present

## 2017-07-05 MED ORDER — CARVEDILOL 6.25 MG PO TABS: ORAL_TABLET | ORAL | 1 refills | Status: DC

## 2017-07-05 MED ORDER — POTASSIUM CHLORIDE CRYS ER 20 MEQ PO TBCR: 40.0000 meq | EXTENDED_RELEASE_TABLET | Freq: Once | ORAL | 3 refills | Status: DC

## 2017-07-05 MED ORDER — OLANZAPINE 5 MG PO TABS: 5.0000 mg | ORAL_TABLET | Freq: Every day | ORAL | 3 refills | Status: DC

## 2017-07-05 MED ORDER — GABAPENTIN 100 MG PO CAPS: ORAL_CAPSULE | ORAL | 3 refills | Status: DC

## 2017-07-05 MED ORDER — ALBUTEROL SULFATE HFA 108 (90 BASE) MCG/ACT IN AERS: INHALATION_SPRAY | RESPIRATORY_TRACT | 3 refills | Status: DC

## 2017-07-05 MED ORDER — HYDRALAZINE HCL 25 MG PO TABS: 25.0000 mg | ORAL_TABLET | Freq: Two times a day (BID) | ORAL | 3 refills | Status: DC

## 2017-07-05 MED ORDER — CLOTRIMAZOLE-BETAMETHASONE 1-0.05 % EX CREA: 1.0000 "application " | TOPICAL_CREAM | Freq: Two times a day (BID) | CUTANEOUS | 3 refills | Status: DC

## 2017-07-05 NOTE — Telephone Encounter (Signed)
Please call her nephew, [name] and relay pt's concern. She has paranoid delusions. Her zyprexa may need to be adjusted but he declined adjustment at her last OV. She also rec'd a flu shot yesterday which can be contributing to her current metal state. Pain med request declined until need fro pain med is confirmed with [name]. Thanks
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
## 2017-07-05 NOTE — Telephone Encounter (Signed)
Spoke with [name], nephew and he stated to hold off on the x-ray and pain medication for now. He stated that he was just with her and she was fine. Stated he will reach out to [place] if he feels the need she needs it.   Also stated that Coralyn Mark wanted him to call back today to confirm medication refills and pharmacy. Needs all medications sent to pharmacy, stated that he checked her bottles and they are all low and no refills. Stated to send to [facility] [address].

## 2017-07-16 ENCOUNTER — Telehealth: Payer: Self-pay | Admitting: *Deleted

## 2017-07-16 NOTE — Telephone Encounter (Signed)
[name] spoke with patient earlier and patient had questions regarding what to take OTC for her bones that Dr. Eulas Post recommended. I reviewed the Bone Density report and she had recommended Calcium with Vitamin D. Instructed patient to take 1200mg  daily. She agreed.

## 2017-07-18 ENCOUNTER — Encounter: Payer: Self-pay | Admitting: Gastroenterology

## 2017-07-27 ENCOUNTER — Ambulatory Visit: Payer: Medicare Other | Admitting: Neurology

## 2017-07-27 IMAGING — MR MR HEAD WO/W CM
11 of 14 series · 31 of 48 positions shown · IV contrast (multihance)
Comparison: None.

CLINICAL DATA: Chronic headache after fall.  Weakness.

EXAM:
MRI HEAD WITHOUT AND WITH CONTRAST
TECHNIQUE: Multiplanar, multiecho pulse sequences of the brain and surrounding
structures were obtained without and with intravenous contrast.
CONTRAST:  10 cc MultiHance intravenous

[Series 2: FLAIR · sagittal · 5.0mm · 0.47mm/px · 1 of 23 slices shown (1 of 2)]
[im 1/23]
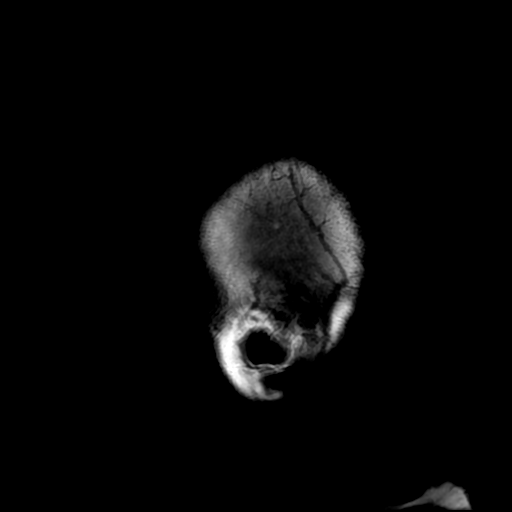

[Series 4: DWI · axial · 3.0mm · 0.94mm/px · z∈[-100,+36]mm · 7 of 94 slices shown (1 of 4)]
[im 1/94]
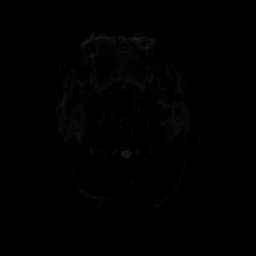
[im 16/94]
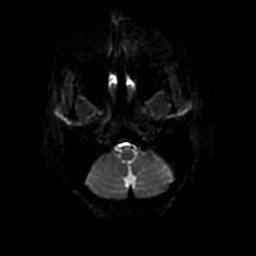
[im 32/94]
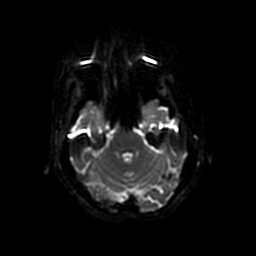
[im 47/94]
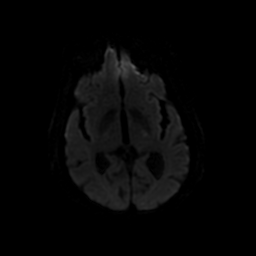
[im 63/94]
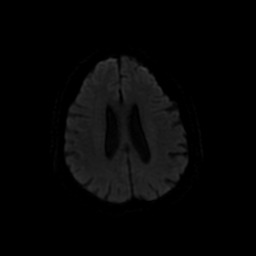
[im 78/94]
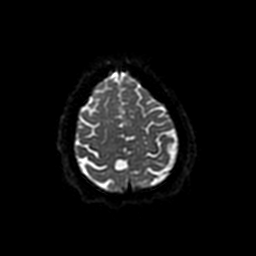
[im 94/94]
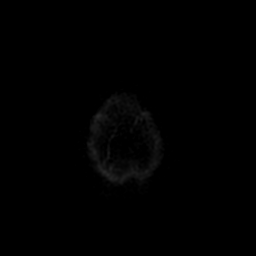

[Series 5: T2 · axial · 5.0mm · 0.43mm/px · z∈[-93,+38]mm · 2 of 23 slices shown (1 of 2)]
[im 1/23]
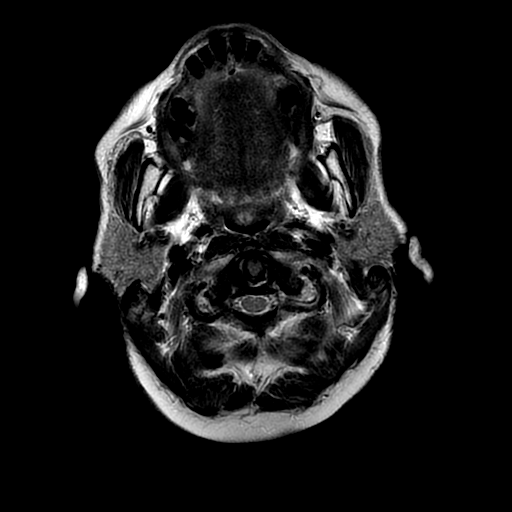
[im 23/23]
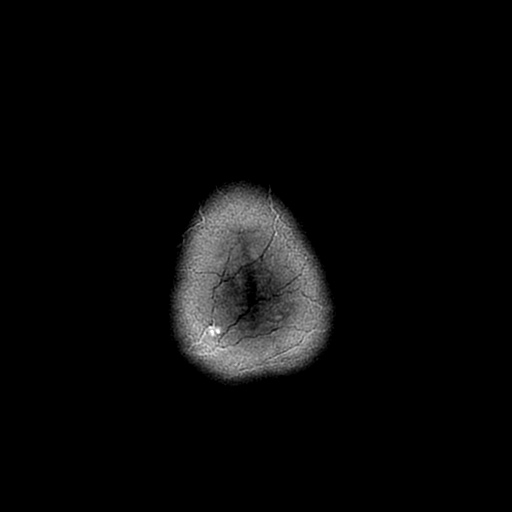

[Series 6: FLAIR · axial · 5.0mm · 0.43mm/px · z∈[-93,+38]mm · 2 of 23 slices shown (2 of 2)]
[im 1/23]
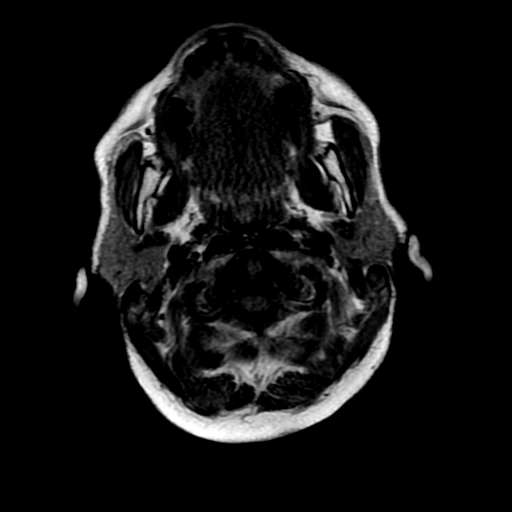
[im 23/23]
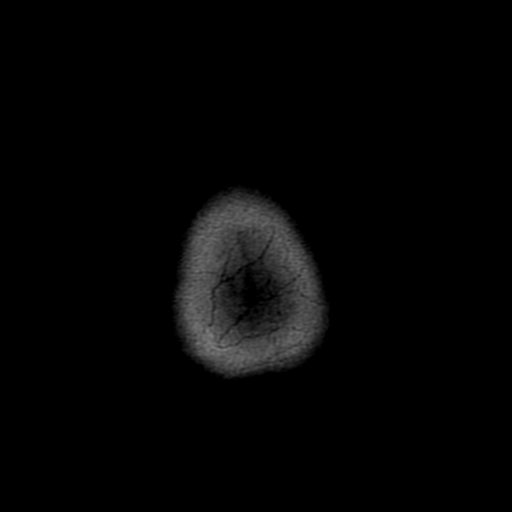

[Series 7: DWI · coronal · 5.0mm · 0.94mm/px · 5 of 64 slices shown (2 of 4)]
[im 1/64]
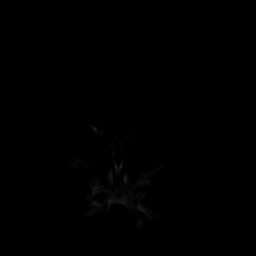
[im 16/64]
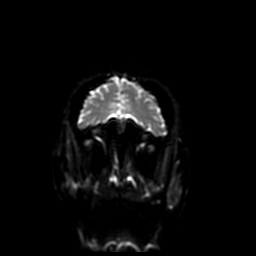
[im 32/64]
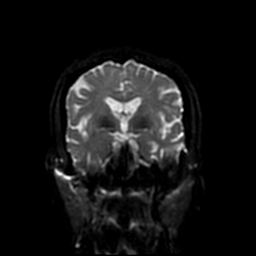
[im 48/64]
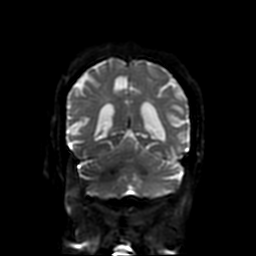
[im 64/64]
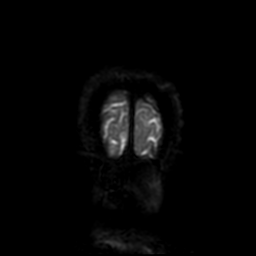

[Series 8: (person_name) · axial · 3.0mm · 0.47mm/px · z∈[-96,-74]mm · 2 of 92 slices shown]
[im 1/92]
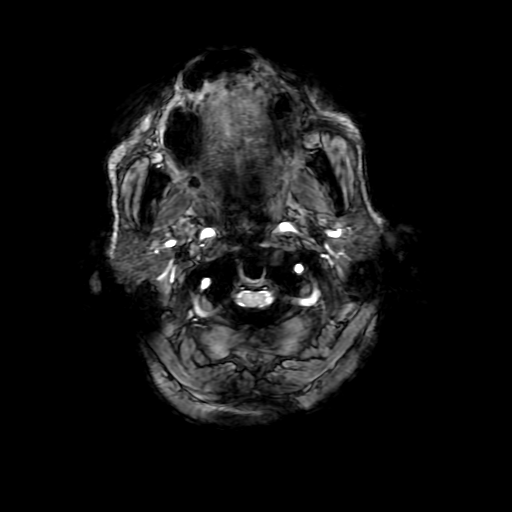
[im 16/92]
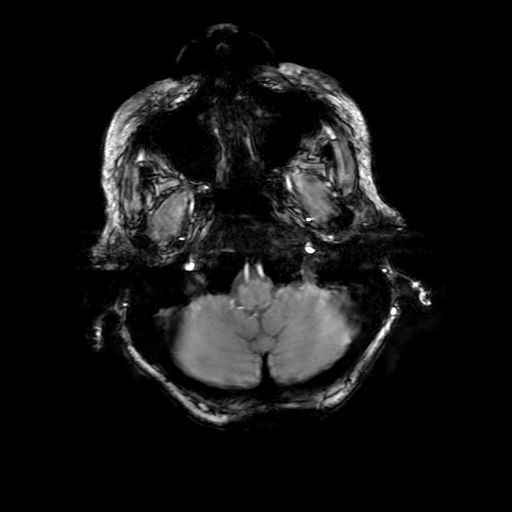

[Series 11: T2 · coronal · 5.0mm · 0.39mm/px · 2 of 27 slices shown (2 of 2)]
[im 1/27]
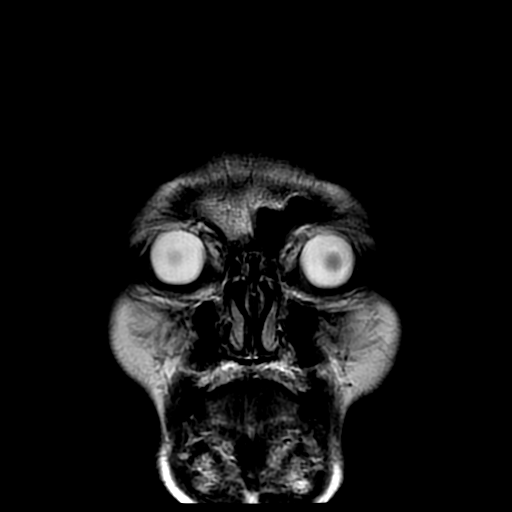
[im 27/27]
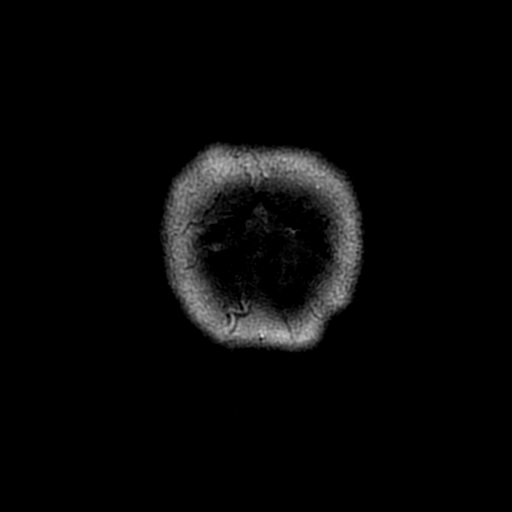

[Series 12: T1 · axial · 5.0mm · 0.43mm/px · z∈[-86,+44]mm · 2 of 23 slices shown (1 of 2)]
[im 1/23]
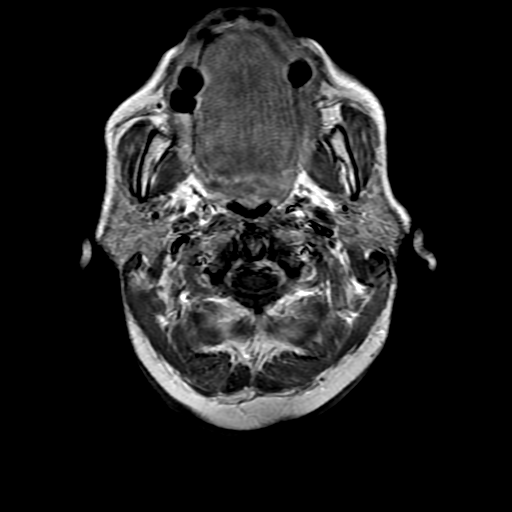
[im 23/23]
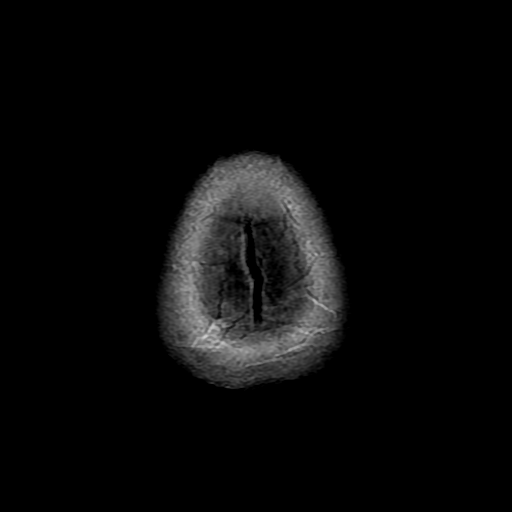

[Series 13: T1 · coronal · 5.0mm · 0.39mm/px · 2 of 27 slices shown (2 of 2)]
[im 1/27]
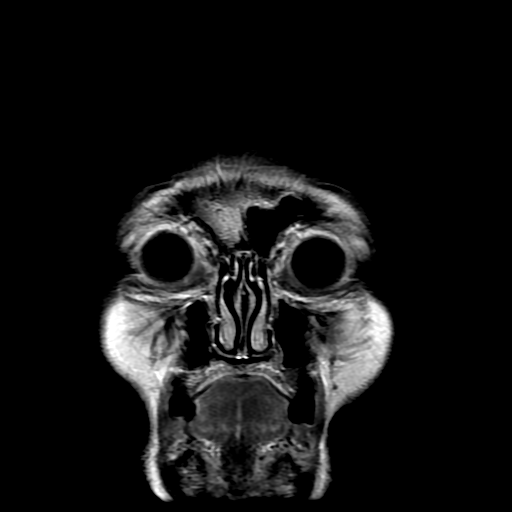
[im 27/27]
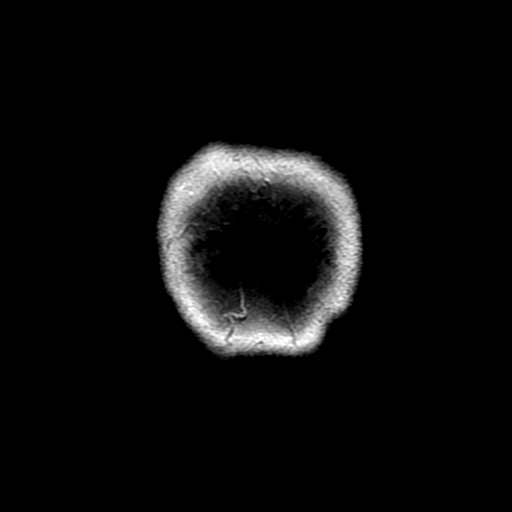

[Series 400: DWI · axial · 3.0mm · 0.94mm/px · z∈[-100,+36]mm · 4 of 47 slices shown (3 of 4)]
[im 1/47]
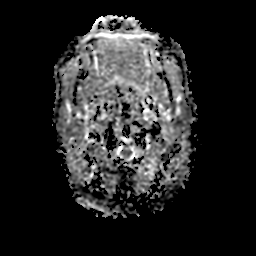
[im 16/47]
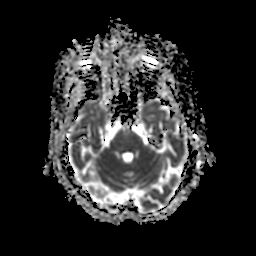
[im 31/47]
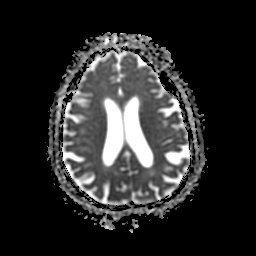
[im 47/47]
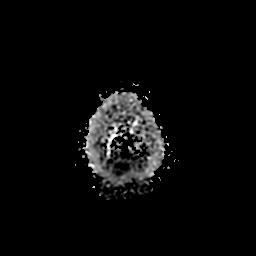

[Series 700: DWI · coronal · 5.0mm · 0.94mm/px · 2 of 32 slices shown (4 of 4)]
[im 1/32]
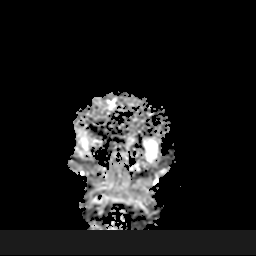
[im 32/32]
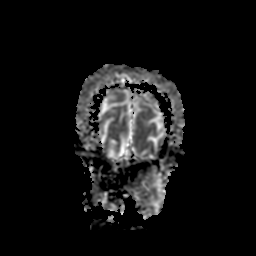

[31 of 48 positions shown; findings below may reference images not displayed]

FINDINGS: Motion degraded study, but overall diagnostic.

Calvarium and upper cervical spine: Atlantodental degeneration with
thick transverse ligaments effacing the ventral CSF. C4-5 discectomy
with complete bony fusion. Advanced adjacent segment degenerative
disc disease accounting for C3 marrow signal heterogeneity. No upper
cord compression to explain frequent falls.

Orbits: No significant findings.

Sinuses and Mastoids: Mild retention of secretions in the left
sphenoid sinus. Mastoid and middle ears are clear.

Brain: No acute abnormality such as acute infarct, hemorrhage,
hydrocephalus, or mass lesion. No evidence of large vessel
occlusion. There is no unusual brain atrophy. Generalized volume
loss is within normal limits for age. T2 and FLAIR hyperintensity
around the lateral ventricles is also normal for age. Single remote
hemorrhagic focus in the left occipital temporal region cortex,
considered incidental in isolation. No abnormal intracranial
enhancement.
IMPRESSION: Normal brain MRI for age.

## 2017-07-31 ENCOUNTER — Ambulatory Visit (INDEPENDENT_AMBULATORY_CARE_PROVIDER_SITE_OTHER): Payer: Medicare Other | Admitting: Internal Medicine

## 2017-07-31 ENCOUNTER — Encounter: Payer: Self-pay | Admitting: Neurology

## 2017-07-31 ENCOUNTER — Encounter: Payer: Self-pay | Admitting: Internal Medicine

## 2017-07-31 ENCOUNTER — Ambulatory Visit: Payer: Medicare Other | Admitting: Internal Medicine

## 2017-07-31 VITALS — BP 144/84 | HR 87 | Temp 98.1°F | Resp 14 | Ht <= 58 in | Wt 141.4 lb

## 2017-07-31 DIAGNOSIS — G8929 Other chronic pain: Secondary | ICD-10-CM

## 2017-07-31 DIAGNOSIS — M2550 Pain in unspecified joint: Secondary | ICD-10-CM | POA: Diagnosis not present

## 2017-07-31 DIAGNOSIS — F22 Delusional disorders: Secondary | ICD-10-CM

## 2017-07-31 DIAGNOSIS — M810 Age-related osteoporosis without current pathological fracture: Secondary | ICD-10-CM | POA: Diagnosis not present

## 2017-07-31 DIAGNOSIS — I872 Venous insufficiency (chronic) (peripheral): Secondary | ICD-10-CM

## 2017-07-31 DIAGNOSIS — I890 Lymphedema, not elsewhere classified: Secondary | ICD-10-CM

## 2017-07-31 NOTE — Patient Instructions (Signed)
Continue current medications as ordered  TAKE TRAMADOL AND GABAPENTIN FOR PAIN  Follow up in 1 month for delusions, chronic pain, lymphedema

## 2017-07-31 NOTE — Progress Notes (Signed)
Patient ID: [name], female   DOB: 02/14/1932, 83 y.o.   MRN: [id number]    Location:  [facility] Place of Service: OFFICE  Chief Complaint  Patient presents with  . Medical Management of Chronic Issues    follow up    HPI:  83 yo female seen today for f/u. She reports her back is hurting from neck to buttocks today. Pain started several yrs ago. Pain is worse today. She has prn tramadol but unsure if she is taking it. She is a poor historian due to psych d/o. Hx obtained from chart. She gained 2 lbs since last OV. She has not been to the lymphedema clinic yet.  Chronic LBP/generalized joint pain  - uncontrolled. Takes tramadol and applies topical agents. 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 DXA 06/2017 revealed osteoporosis with T score -2.6. She is taking Ca w D 1292m daily. Prolia also Rx  Chronic systolic HF - no exacerbations. stable on coreg, torsemide + potassium and hydralazine. Followed by cardio Dr HDaneen Schick EF 55-60% (EF 35-40% by 2D echo in 2015). Prior echo also revealed septal wall akinesis/severe hypokinesis; mod-sev diffuse hypokinesis.  Peripheral neuropathy - stable on gabapentin. Followed by neurology Dr JTomi Likens Venous insufficiency/chronic LE lymphedema - stable on torsemide, clotrimazole/betamethasone cream, silvadene cream. She does not follow with lymphedema clinic nor use her TED stockings  Psychosis/ hallucinations - unchanged. no new behavioral disturbances and no formal psych evaluation. Family not willing for counseling at this time. She lives alone and gets meals on wheels. family stays with her at night most days of the week and she gets some PCS services. She reports she is taking zyprexa 55mqhs   Past Medical History:  Diagnosis Date  . Anemia, unspecified   . Anxiety   . Arthritis   . Asthma   . Delusions ([facility]  . Depression   . Edema 2005   "since put the glass in them"   . GERD (gastroesophageal reflux disease)   .  Lumbago   . Lymphedema   . Obstructive chronic bronchitis without exacerbation ([facility]  . Other abnormal glucose   . 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[place]  . Systolic heart failure (H[facility])    EF 35 to 40% per echo January 2015  . Unspecified essential hypertension   . Unspecified nonpsychotic mental disorder   . Unspecified venous (peripheral) insufficiency   . Unspecified venous (peripheral) insufficiency   . Unspecified vitamin D deficiency     Past Surgical History:  Procedure Laterality Date  . APPENDECTOMY    . CERVICAL SPINE SURGERY     x 2  . CHOLECYSTECTOMY    . ESOPHAGOGASTRODUODENOSCOPY N/A 04/05/2015   Procedure: ESOPHAGOGASTRODUODENOSCOPY (EGD);  Surgeon: MaLadene ArtistMD;  [facility];  Service: Endoscopy;  Laterality: N/A;  . FLEXIBLE SIGMOIDOSCOPY N/A 04/05/2015   Procedure: FLEXIBLE SIGMOIDOSCOPY;  Surgeon: MaLadene ArtistMD;  Location: [facility];  Service: Endoscopy;  Laterality: N/A;  . HAND SURGERY    . LUMBAR DISC SURGERY    . PARTIAL HYSTERECTOMY    . TONSILLECTOMY      Patient Care Team: CaGildardo CrankerDO as PCP - General (Internal Medicine) NaNoralee SpaceMD (Pulmonary Disease) TiJacolyn ReedyMD as Consulting Physician (Cardiology) JaPieter PartridgeDO as Consulting Physician (Neurology)  Social History   Social History  . Marital status: Widowed    Spouse name: N/A  . Number of children: 0  . Years  of education: N/A   Occupational History  . retired    Social History Main Topics  . Smoking status: Never Smoker  . Smokeless tobacco: Never Used  . Alcohol use No  . Drug use: No  . Sexual activity: Not Currently   Other Topics Concern  . Not on file   Social History Narrative   Diet? Good, low appetite      Do you drink/eat things with caffeine? no      Marital status?                  widowed                  What year were you married?      Do you live in a house, apartment, assisted living, condo, trailer, etc.? house        Is it one or more stories? 1 story      How many persons live in your home? Only me      Do you have any pets in your home? (please list) no      Current or past profession: lab tech      Do you exercise?        some                              Type & how often? Foot rolls, some walking      Do you have a living will? yes      Do you have a DNR form?    no                              If not, do you want to discuss one?      Do you have signed POA/HPOA for forms? yes        reports that she has never smoked. She has never used smokeless tobacco. She reports that she does not drink alcohol or use drugs.  Family History  Problem Relation Age of Onset  . Diabetes Mother   . Arthritis Father   . Diabetes Paternal Grandfather        entire family on both sides  . Heart disease Maternal Aunt        entire family of both sides  . Cancer Paternal Aunt        type unknown  . Diabetes Sister   . Breast cancer Cousin    Family Status  Relation Status  . Mother Deceased  . Father Deceased  . MGM Deceased  . MGF Deceased  . PGM Deceased  . PGF Deceased  . Mat Aunt (Not Specified)  . Ethlyn Daniels (Not Specified)  . Sister Alive  . Brother Alive  . Sister Alive  . Brother Alive  . Sister Deceased  . Cousin (Not Specified)     Allergies  Allergen Reactions  . Aspirin Other (See Comments)    REACTION: upset stomach  . Codeine Other (See Comments)    REACTION: "MAKES MY BODY GO CRAZY; CRAMPS"  . Latex Nausea And Vomiting    Medications: Patient's Medications  New Prescriptions   No medications on file  Previous Medications   ACETAMINOPHEN (TYLENOL 8 HOUR) 650 MG CR TABLET    Take 1 tablet (650 mg total) by mouth every 8 (eight) hours as needed for pain.   ALBUTEROL (PROAIR HFA)  108 (90 BASE) MCG/ACT INHALER    INHALE 2 PUFFS EVERY 4 HOURS AS NEEDED FOR WHEEZING   ALPRAZOLAM (XANAX) 0.5 MG TABLET    Take 1/2 to 1 tablet by mouth three times daily as needed for  nerves   ASCORBIC ACID (VITAMIN C) 1000 MG TABLET    Take 2,000 mg by mouth daily.   CARVEDILOL (COREG) 6.25 MG TABLET    TAKE 1 TABLET BY MOUTH TWICE DAILY   CLOTRIMAZOLE-BETAMETHASONE (LOTRISONE) CREAM    Apply 1 application topically 2 (two) times daily. To your legs   DIPHENHYD-HYDROCORT-NYSTATIN (FIRST-DUKES MOUTHWASH) SUSP    1 tsp gargle and swallow three times daily as needed   FERROUS SULFATE DRIED 200 (65 FE) MG TABS    Take 1 tablet by mouth 2 (two) times daily.   FLUTICASONE-SALMETEROL (ADVAIR HFA) 115-21 MCG/ACT INHALER    USE 2 INHALATIONS TWICE A DAY   GABAPENTIN (NEURONTIN) 100 MG CAPSULE    Take 2 capsules by mouth in the morning and 2 capsules by mouth at bedtime   HYDRALAZINE (APRESOLINE) 25 MG TABLET    Take 1 tablet (25 mg total) by mouth 2 (two) times daily.   OLANZAPINE (ZYPREXA) 5 MG TABLET    Take 1 tablet (5 mg total) by mouth at bedtime.   ONDANSETRON (ZOFRAN) 4 MG TABLET    Take 1 tablet (4 mg total) by mouth every 8 (eight) hours as needed for nausea or vomiting.   POTASSIUM CHLORIDE SA (K-DUR,KLOR-CON) 20 MEQ TABLET    Take 2 tablets (40 mEq total) by mouth once.   SILVER SULFADIAZINE (SILVADENE) 1 % CREAM    Apply 1 application topically daily.   TORSEMIDE (DEMADEX) 20 MG TABLET    Take 3 tablets (60 mg total) by mouth 2 (two) times daily.   TRAMADOL (ULTRAM) 50 MG TABLET    Take 1 tablet (50 mg total) by mouth 3 (three) times daily as needed.   VITAMIN D, ERGOCALCIFEROL, (DRISDOL) 50000 UNITS CAPS CAPSULE    Take 50,000 Units by mouth every 7 (seven) days.  Modified Medications   No medications on file  Discontinued Medications   No medications on file    Review of Systems  Unable to perform ROS: Psychiatric disorder (delusions)    Vitals:   07/31/17 1425  BP: (!) 144/84  Pulse: 87  Resp: 14  Temp: 98.1 F (36.7 C)  TempSrc: Oral  SpO2: 91%  Weight: 141 lb 6.4 oz (64.1 kg)  Height: 4\' 10"  (1.473 m)   Body mass index is 29.55 kg/m.  Physical  Exam  Constitutional: She appears well-developed and well-nourished.  HENT:  Mouth/Throat: Oropharynx is clear and moist. No oropharyngeal exudate.  MMM; no oral thrush  Eyes: Pupils are equal, round, and reactive to light. No scleral icterus.  Neck: Neck supple. Carotid bruit is not present. No tracheal deviation present. No thyromegaly present.  Cardiovascular: Normal rate, regular rhythm and intact distal pulses.  Exam reveals no gallop and no friction rub.   Murmur (1/6 SEM) heard. L>RLE lymphedema with no signs of secondary infection. No calf TTP  Pulmonary/Chest: Effort normal and breath sounds normal. No stridor. No respiratory distress. She has no wheezes. She has no rales.  Abdominal: Soft. Normal appearance and bowel sounds are normal. She exhibits no distension and no mass. There is no hepatomegaly. There is tenderness (generalized). There is no rigidity, no rebound and no guarding. No hernia.  Musculoskeletal: She exhibits edema, tenderness (b/l ASIS) and deformity (kyphosis).  Lumbar back: She exhibits decreased range of motion, tenderness and spasm.       Back:  Lymphadenopathy:    She has no cervical adenopathy.  Neurological: She is alert.  Skin: Skin is warm and dry. No rash noted.  Psychiatric: She has a normal mood and affect. Her behavior is normal. Thought content is delusional.     Labs reviewed: Appointment on 05/16/2017  Component Date Value Ref Range Status  . Weight 05/16/2017 2224  oz Final  . Height 05/16/2017 58.000  in Final  . BP 05/16/2017 134/70  mmHg Final  Admission on 05/12/2017, Discharged on 05/14/2017  Component Date Value Ref Range Status  . WBC 05/12/2017 5.1  4.0 - 10.5 K/uL Final  . RBC 05/12/2017 3.44* 3.87 - 5.11 MIL/uL Final  . Hemoglobin 05/12/2017 9.2* 12.0 - 15.0 g/dL Final  . HCT 05/12/2017 29.1* 36.0 - 46.0 % Final  . MCV 05/12/2017 84.6  78.0 - 100.0 fL Final  . MCH 05/12/2017 26.7  26.0 - 34.0 pg Final  . MCHC 05/12/2017  31.6  30.0 - 36.0 g/dL Final  . RDW 05/12/2017 16.6* 11.5 - 15.5 % Final  . Platelets 05/12/2017 202  150 - 400 K/uL Final  . Sodium 05/12/2017 137  135 - 145 mmol/L Final  . Potassium 05/12/2017 4.6  3.5 - 5.1 mmol/L Final  . Chloride 05/12/2017 103  101 - 111 mmol/L Final  . CO2 05/12/2017 26  22 - 32 mmol/L Final  . Glucose, Bld 05/12/2017 74  65 - 99 mg/dL Final  . BUN 05/12/2017 30* 6 - 20 mg/dL Final  . Creatinine, Ser 05/12/2017 1.49* 0.44 - 1.00 mg/dL Final  . Calcium 05/12/2017 9.7  8.9 - 10.3 mg/dL Final  . Total Protein 05/12/2017 8.6* 6.5 - 8.1 g/dL Final  . Albumin 05/12/2017 3.6  3.5 - 5.0 g/dL Final  . AST 05/12/2017 20  15 - 41 U/L Final  . ALT 05/12/2017 12* 14 - 54 U/L Final  . Alkaline Phosphatase 05/12/2017 59  38 - 126 U/L Final  . Total Bilirubin 05/12/2017 0.6  0.3 - 1.2 mg/dL Final  . GFR calc non Af Amer 05/12/2017 31* >60 mL/min Final  . GFR calc Af Amer 05/12/2017 36* >60 mL/min Final   Comment: (NOTE) The eGFR has been calculated using the CKD EPI equation. This calculation has not been validated in all clinical situations. eGFR's persistently <60 mL/min signify possible Chronic Kidney Disease.   . Anion gap 05/12/2017 8  5 - 15 Final  . Alcohol, Ethyl (B) 05/12/2017 <5  <5 mg/dL Final   Comment:        LOWEST DETECTABLE LIMIT FOR SERUM ALCOHOL IS 5 mg/dL FOR MEDICAL PURPOSES ONLY   . Opiates 05/12/2017 NONE DETECTED  NONE DETECTED Final  . Cocaine 05/12/2017 NONE DETECTED  NONE DETECTED Final  . Benzodiazepines 05/12/2017 NONE DETECTED  NONE DETECTED Final  . Amphetamines 05/12/2017 NONE DETECTED  NONE DETECTED Final  . Tetrahydrocannabinol 05/12/2017 NONE DETECTED  NONE DETECTED Final  . Barbiturates 05/12/2017 NONE DETECTED  NONE DETECTED Final   Comment:        DRUG SCREEN FOR MEDICAL PURPOSES ONLY.  IF CONFIRMATION IS NEEDED FOR ANY PURPOSE, NOTIFY LAB WITHIN 5 DAYS.        LOWEST DETECTABLE LIMITS FOR URINE DRUG SCREEN Drug Class        Cutoff (ng/mL) Amphetamine      1000 Barbiturate      200 Benzodiazepine   200  Tricyclics       681 Opiates          300 Cocaine          300 THC              50   . Color, Urine 05/12/2017 YELLOW  YELLOW Final  . APPearance 05/12/2017 CLEAR  CLEAR Final  . Specific Gravity, Urine 05/12/2017 1.010  1.005 - 1.030 Final  . pH 05/12/2017 8.0  5.0 - 8.0 Final  . Glucose, UA 05/12/2017 NEGATIVE  NEGATIVE mg/dL Final  . Hgb urine dipstick 05/12/2017 NEGATIVE  NEGATIVE Final  . Bilirubin Urine 05/12/2017 NEGATIVE  NEGATIVE Final  . Ketones, ur 05/12/2017 NEGATIVE  NEGATIVE mg/dL Final  . Protein, ur 05/12/2017 NEGATIVE  NEGATIVE mg/dL Final  . Nitrite 05/12/2017 NEGATIVE  NEGATIVE Final  . Leukocytes, UA 05/12/2017 TRACE* NEGATIVE Final  . RBC / HPF 05/12/2017 0-5  0 - 5 RBC/hpf Final  . WBC, UA 05/12/2017 0-5  0 - 5 WBC/hpf Final  . Bacteria, UA 05/12/2017 RARE* NONE SEEN Final  . Squamous Epithelial / LPF 05/12/2017 0-5* NONE SEEN Final    Dg Bone Density  Result Date: 07/05/2017 EXAM: DUAL X-RAY ABSORPTIOMETRY (DXA) FOR BONE MINERAL DENSITY IMPRESSION: Referring Physician:  Lorain Keast PATIENT: Name: Terin, Dierolf Patient ID: [id number] Birth Date: 1932/03/25 Height: 58.0 in. Sex: Female Measured: 07/05/2017 Weight: 139.4 lbs. Indications: Advanced Age, Estrogen Deficient, Family History of Osteoporosis, Gabapentin, Height Loss (781.91), History of Fracture (Adult) (V15.51), Hysterectomy, Low Calcium Intake (269.3), multiple broken bones, Postmenopausal, Secondary Osteoporosis, Vitamin D Deficient Fractures: Ankle, vertebrae Treatments: Calcium (E943.0), Vitamin D (E933.5) ASSESSMENT: The BMD measured at Femur Neck Right is 0.682 g/cm2 with a T-score of -2.6. This patient is considered osteoporotic according to [facility]) criteria. L-3 and L-4 were excluded due to degenerative changes. Patient does not meet criteria for FRAX assessment. Site Region Measured Date  Measured Age YA BMD Significant CHANGE T-score DualFemur Neck Right 9/05/2017    83.3         -2.6    0.682 g/cm2 AP Spine  L1-L2      9/05/2017    83.3         -1.9    0.941 g/cm2 World Health Organization [facility]) criteria for post-menopausal, Caucasian Women: Normal       T-score at or above -1 SD Osteopenia   T-score between -1 and -2.5 SD Osteoporosis T-score at or below -2.5 SD RECOMMENDATION: [place] recommends that FDA-approved medical therapies be considered in postmenopausal women and men age 11 or older with a: 1. Hip or vertebral (clinical or morphometric) fracture. 2. T-score of <-2.5 at the spine or hip. 3. Ten-year fracture probability by FRAX of 3% or greater for hip fracture or 20% or greater for major osteoporotic fracture. All treatment decisions require clinical judgment and consideration of individual patient factors, including patient preferences, co-morbidities, previous drug use, risk factors not captured in the FRAX model (e.g. falls, vitamin D deficiency, increased bone turnover, interval significant decline in bone density) and possible under - or over-estimation of fracture risk by FRAX. All patients should ensure an adequate intake of dietary calcium (1200 mg/d) and vitamin D (800 IU daily) unless contraindicated. FOLLOW-UP: People with diagnosed cases of osteoporosis or at high risk for fracture should have regular bone mineral density tests. For patients eligible for Medicare, routine testing is allowed once every 2 years. The testing frequency can be increased to one  year for patients who have rapidly progressing disease, those who are receiving or discontinuing medical therapy to restore bone mass, or have additional risk factors. I have reviewed this report, and agree with the above findings. [facility]iology Electronically Signed   By: Earle Gell M.D.   On: 07/05/2017 14:39   Mm Screening Breast Tomo Bilateral  Result Date: 07/06/2017 CLINICAL DATA:   Screening. EXAM: 2D DIGITAL SCREENING BILATERAL MAMMOGRAM WITH CAD AND ADJUNCT TOMO COMPARISON:  Previous exam(s). ACR Breast Density Category c: The breast tissue is heterogeneously dense, which may obscure small masses. FINDINGS: There are no findings suspicious for malignancy. Images were processed with CAD. IMPRESSION: No mammographic evidence of malignancy. A result letter of this screening mammogram will be mailed directly to the patient. RECOMMENDATION: Screening mammogram in one year. (Code:SM-B-01Y) BI-RADS CATEGORY  1: Negative. Electronically Signed   By: Fidela Salisbury M.D.   On: 07/06/2017 16:59     Assessment/Plan   ICD-10-CM   1. Lymphedema of both lower extremities I89.0   2. Venous (peripheral) insufficiency I87.2   3. Paranoid delusion ([place]) F22   4. Chronic pain of multiple joints M25.50    G89.29   5. Age-related osteoporosis without current pathological fracture M81.0    Continue current medications as ordered  TAKE TRAMADOL AND GABAPENTIN FOR PAIN  F/u with lymphedema clinic  Follow up in 1 month for delusions, chronic pain, lymphedema  Jeremey Bascom S. Perlie Gold  [facility] [address] [place], [place] [address] [phone] Cell (Monday-Friday 8 AM - 5 PM) [phone] After 5 PM and follow prompts

## 2017-08-01 ENCOUNTER — Other Ambulatory Visit: Payer: Self-pay | Admitting: Internal Medicine

## 2017-08-01 ENCOUNTER — Other Ambulatory Visit: Payer: Self-pay | Admitting: *Deleted

## 2017-08-01 DIAGNOSIS — M810 Age-related osteoporosis without current pathological fracture: Secondary | ICD-10-CM | POA: Insufficient documentation

## 2017-08-01 DIAGNOSIS — I5022 Chronic systolic (congestive) heart failure: Secondary | ICD-10-CM

## 2017-08-01 DIAGNOSIS — G8929 Other chronic pain: Secondary | ICD-10-CM | POA: Insufficient documentation

## 2017-08-01 DIAGNOSIS — F22 Delusional disorders: Secondary | ICD-10-CM

## 2017-08-01 DIAGNOSIS — I890 Lymphedema, not elsewhere classified: Secondary | ICD-10-CM

## 2017-08-01 DIAGNOSIS — M2550 Pain in unspecified joint: Secondary | ICD-10-CM

## 2017-08-01 MED ORDER — CARVEDILOL 6.25 MG PO TABS: ORAL_TABLET | ORAL | 1 refills | Status: DC

## 2017-08-01 MED ORDER — CLOTRIMAZOLE-BETAMETHASONE 1-0.05 % EX CREA: 1.0000 "application " | TOPICAL_CREAM | Freq: Two times a day (BID) | CUTANEOUS | 3 refills | Status: DC

## 2017-08-01 MED ORDER — HYDRALAZINE HCL 25 MG PO TABS: 25.0000 mg | ORAL_TABLET | Freq: Two times a day (BID) | ORAL | 1 refills | Status: DC

## 2017-08-01 MED ORDER — POTASSIUM CHLORIDE CRYS ER 20 MEQ PO TBCR: 40.0000 meq | EXTENDED_RELEASE_TABLET | Freq: Once | ORAL | 3 refills | Status: DC

## 2017-08-01 MED ORDER — OLANZAPINE 5 MG PO TABS: 5.0000 mg | ORAL_TABLET | Freq: Every day | ORAL | 1 refills | Status: DC

## 2017-08-02 ENCOUNTER — Telehealth: Payer: Self-pay | Admitting: Neurology

## 2017-08-02 NOTE — Telephone Encounter (Signed)
Patient dismissed from [facility] by Metta Clines DO , effective July 31, 2017. Dismissal letter sent out by certified / registered mail.  daj

## 2017-08-06 ENCOUNTER — Telehealth: Payer: Self-pay | Admitting: *Deleted

## 2017-08-06 MED ORDER — POTASSIUM CHLORIDE CRYS ER 20 MEQ PO TBCR: 40.0000 meq | EXTENDED_RELEASE_TABLET | Freq: Every day | ORAL | 3 refills | Status: DC

## 2017-08-06 NOTE — Telephone Encounter (Signed)
Should be once daily.

## 2017-08-06 NOTE — Telephone Encounter (Signed)
Medication directions corrected and faxed to pharmacy.

## 2017-08-06 NOTE — Telephone Encounter (Signed)
Received fax from [place] for clarification on patient's [place] Con directions. Stating to take two tablets by mouth once. Should it read Take two tablets by mouth once daily. Please Advise.

## 2017-08-13 NOTE — Telephone Encounter (Signed)
Received signed domestic return receipt verifying delivery of certified letter on August 04, 2017. Article number [id number]

## 2017-08-16 ENCOUNTER — Other Ambulatory Visit: Payer: Self-pay | Admitting: *Deleted

## 2017-08-16 NOTE — Telephone Encounter (Signed)
This is fine but need to educate pt that she needs to only be getting this medication from one practice, we will need her to sign pain contract and have this on file.

## 2017-08-16 NOTE — Telephone Encounter (Signed)
rx written 07/2016 by Dr. Teressa Lower #[id number], ok to fill?  Faxed request came from [place]

## 2017-08-16 NOTE — Telephone Encounter (Signed)
.  left message to have patient return my call.  

## 2017-09-04 ENCOUNTER — Encounter (HOSPITAL_COMMUNITY): Payer: Self-pay

## 2017-09-04 ENCOUNTER — Emergency Department (HOSPITAL_COMMUNITY)
Admission: EM | Admit: 2017-09-04 | Discharge: 2017-09-04 | Disposition: A | Discharge status: Home / Self Care | Payer: Medicare Other | Attending: Emergency Medicine | Admitting: Emergency Medicine

## 2017-09-04 DIAGNOSIS — R600 Localized edema: Secondary | ICD-10-CM | POA: Diagnosis present

## 2017-09-04 DIAGNOSIS — R21 Rash and other nonspecific skin eruption: Secondary | ICD-10-CM | POA: Diagnosis not present

## 2017-09-04 DIAGNOSIS — Z91040 Latex allergy status: Secondary | ICD-10-CM | POA: Insufficient documentation

## 2017-09-04 DIAGNOSIS — L309 Dermatitis, unspecified: Secondary | ICD-10-CM | POA: Insufficient documentation

## 2017-09-04 DIAGNOSIS — I5022 Chronic systolic (congestive) heart failure: Secondary | ICD-10-CM | POA: Diagnosis not present

## 2017-09-04 DIAGNOSIS — N183 Chronic kidney disease, stage 3 (moderate): Secondary | ICD-10-CM | POA: Insufficient documentation

## 2017-09-04 DIAGNOSIS — I130 Hypertensive heart and chronic kidney disease with heart failure and stage 1 through stage 4 chronic kidney disease, or unspecified chronic kidney disease: Secondary | ICD-10-CM | POA: Diagnosis not present

## 2017-09-04 DIAGNOSIS — Z79899 Other long term (current) drug therapy: Secondary | ICD-10-CM | POA: Insufficient documentation

## 2017-09-04 DIAGNOSIS — E1122 Type 2 diabetes mellitus with diabetic chronic kidney disease: Secondary | ICD-10-CM | POA: Diagnosis not present

## 2017-09-04 DIAGNOSIS — J45909 Unspecified asthma, uncomplicated: Secondary | ICD-10-CM | POA: Diagnosis not present

## 2017-09-04 LAB — COMPREHENSIVE METABOLIC PANEL
ALT: 12 U/L — AB (ref 14–54)
AST: 21 U/L (ref 15–41)
Albumin: 3.1 g/dL — ABNORMAL LOW (ref 3.5–5.0)
Alkaline Phosphatase: 52 U/L (ref 38–126)
Anion gap: 6 (ref 5–15)
BILIRUBIN TOTAL: 0.4 mg/dL (ref 0.3–1.2)
BUN: 11 mg/dL (ref 6–20)
CALCIUM: 9.1 mg/dL (ref 8.9–10.3)
CHLORIDE: 109 mmol/L (ref 101–111)
CO2: 23 mmol/L (ref 22–32)
CREATININE: 0.99 mg/dL (ref 0.44–1.00)
GFR, EST AFRICAN AMERICAN: 59 mL/min — AB (ref >60)
GFR, EST NON AFRICAN AMERICAN: 51 mL/min — AB (ref >60)
Glucose, Bld: 77 mg/dL (ref 65–99)
Potassium: 4.2 mmol/L (ref 3.5–5.1)
Sodium: 138 mmol/L (ref 135–145)
TOTAL PROTEIN: 8.1 g/dL (ref 6.5–8.1)

## 2017-09-04 LAB — CBC
HEMATOCRIT: 28.9 % — AB (ref 36.0–46.0)
Hemoglobin: 8.9 g/dL — ABNORMAL LOW (ref 12.0–15.0)
MCH: 26.2 pg (ref 26.0–34.0)
MCHC: 30.8 g/dL (ref 30.0–36.0)
MCV: 85 fL (ref 78.0–100.0)
Platelets: 253 10*3/uL (ref 150–400)
RBC: 3.4 MIL/uL — ABNORMAL LOW (ref 3.87–5.11)
RDW: 15.4 % (ref 11.5–15.5)
WBC: 5.2 10*3/uL (ref 4.0–10.5)

## 2017-09-04 LAB — ETHANOL: Alcohol, Ethyl (B): <10 mg/dL (ref <10)

## 2017-09-04 LAB — SALICYLATE LEVEL: Salicylate Lvl: <7 mg/dL (ref 2.8–30.0)

## 2017-09-04 LAB — ACETAMINOPHEN LEVEL

## 2017-09-04 MED ORDER — TRIAMCINOLONE 0.1 % CREAM:EUCERIN CREAM 1:1: 1.0000 "application " | TOPICAL_CREAM | Freq: Two times a day (BID) | CUTANEOUS | 1 refills | Status: DC

## 2017-09-04 NOTE — ED Provider Notes (Signed)
[facility] DEPART[facility] Provider Note   CSN: [id number] Arrival date & time: 09/04/17  1545     History   Chief Complaint Chief Complaint  Patient presents with  . Follow-up    HPI [name] is a 83 y.o. female.  Patient is an 83 year old female with a history of psychotic paranoia, asthma, lymphedema, GERD, heart failure, chronic kidney disease who is presenting today with vague complaints.  Patient states that multiple people told her she had herpes virus or possible TB and she has followed up with her regular doctor who has confirmed that everything appears to be fine but she states she still continues to worry about it.  Patient denies any shortness of breath, fever, chest pain, abdominal pain.  She states she has chronic swelling in her legs but it is no different.  She has not been having night sweats or weight loss.  Is taking all of her normal medications.  She denies any urinary symptoms at this time and just wanted to be rechecked. Speaking with the patient's family member outside the room she states that [name] is acting her normal self.  They have spent the day together and she has seemed appropriate.  She states she always says bizarre things occasionally but that is her baseline.  She otherwise lives alone and has been doing relatively well.   The history is provided by the patient.    Past Medical History:  Diagnosis Date  . Anemia, unspecified   . Anxiety   . Arthritis   . Asthma   . Delusions ([place])   . Depression   . Edema 2005   "since put the glass in them"   . GERD (gastroesophageal reflux disease)   . Lumbago   . Lymphedema   . Obstructive chronic bronchitis without exacerbation ([place])   . Other abnormal glucose   . 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[place]) 04/04/2013  . Systolic heart failure (HCC)    EF 35 to 40% per echo January 2015  . Unspecified essential hypertension   . Unspecified nonpsychotic mental disorder   .  Unspecified venous (peripheral) insufficiency   . Unspecified venous (peripheral) insufficiency   . Unspecified vitamin D deficiency     Patient Active Problem List   Diagnosis Date Noted  . Chronic pain of multiple joints 08/01/2017  . Age-related osteoporosis without current pathological fracture 08/01/2017  . Left knee pain 09/19/2016  . Refractory anemia, unspecified ([place]) 08/10/2016  . Headache 06/22/2015  . Rectal benign neoplasm   . Asthma, chronic 04/04/2015  . history of Tubular adenoma of colon 04/04/2015  . 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 Hypokalemia 04/04/2015  . CKD (chronic kidney disease) stage 3, GFR 30-59 ml/min (HCC) 04/03/2015  . Dehydration 04/03/2015  . Skin ulcer ([place])   . History of colonic polyps 12/08/2014  . Edema 04/14/2014  . Chronic systolic heart failure ([place]) 11/20/2013  . Nausea with vomiting 11/13/2013  . Lymphedema of lower extremity 10/17/2013  . 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[place]) 04/04/2013  . MGUS (monoclonal gammopathy of unknown significance) 05/15/2012  . VITAMIN D DEFICIENCY 01/29/2010  . BURSITIS, LEFT ELBOW 01/29/2010  . DERMATITIS 09/30/2009  . DIABETES MELLITUS, BORDERLINE 01/11/2009  . Paranoid delusion ([place]) 08/02/2008  . Obstructive chronic bronchitis ([place]) 08/02/2008  . Essential hypertension 11/11/2007  . Venous (peripheral) insufficiency 11/11/2007  . RENAL CALCULUS 11/11/2007  . LOW BACK PAIN, CHRONIC 11/11/2007    Past Surgical History:  Procedure Laterality Date  . APPENDECTOMY    .  CERVICAL SPINE SURGERY     x 2  . CHOLECYSTECTOMY    . HAND SURGERY    . LUMBAR DISC SURGERY    . PARTIAL HYSTERECTOMY    . TONSILLECTOMY      OB History    No data available       Home Medications    Prior to Admission medications   Medication Sig Start Date End Date Taking? Authorizing Provider  acetaminophen (TYLENOL 8 HOUR) 650 MG CR tablet Take 1 tablet (650 mg total) by mouth every 8 (eight) hours as needed for  pain. 01/24/17  Yes Nanavati, Ankit, MD  albuterol (PROAIR HFA) 108 (90 Base) MCG/ACT inhaler INHALE 2 PUFFS EVERY 4 HOURS AS NEEDED FOR WHEEZING Patient taking differently: Inhale 2 puffs every 4 (four) hours as needed into the lungs for wheezing.  07/05/17  Yes Eulas Post, Monica, DO  ALPRAZolam Duanne Moron) 0.5 MG tablet Take 1/2 to 1 tablet by mouth three times daily as needed for nerves Patient taking differently: Take 0.5-1 mg 3 (three) times daily as needed by mouth for anxiety.  05/16/17  Yes Gildardo Cranker, DO  Ascorbic Acid (VITAMIN C) 1000 MG tablet Take 2,000 mg by mouth daily.   Yes [provider]  carvedilol (COREG) 6.25 MG tablet TAKE 1 TABLET BY MOUTH TWICE DAILY Patient taking differently: Take 6.25 mg 2 (two) times daily with a meal by mouth.  08/01/17  Yes Eulas Post, Monica, DO  clotrimazole-betamethasone (LOTRISONE) cream Apply 1 application topically 2 (two) times daily. To your legs 08/01/17  Yes Eulas Post, Sherwood, DO  Diphenhyd-Hydrocort-Nystatin (FIRST-DUKES MOUTHWASH) SUSP 1 tsp gargle and swallow three times daily as needed Patient taking differently: Swish and swallow 5 mLs 3 (three) times daily as needed (irritation).  02/01/17  Yes [name], MD  Ferrous Sulfate Dried 200 (65 FE) MG TABS Take 1 tablet by mouth 2 (two) times daily. Patient taking differently: Take 200 mg 2 (two) times daily by mouth.  03/03/15  Yes [name], MD  fluticasone-salmeterol (ADVAIR HFA) [phone] MCG/ACT inhaler USE 2 INHALATIONS TWICE A DAY Patient taking differently: Inhale 2 puffs 2 (two) times daily into the lungs.  05/08/17  Yes [name], MD  gabapentin (NEURONTIN) 100 MG capsule TAKE 2 CAPSULES IN THE MORNING AND 2 CAPSULES AT BEDTIME Patient taking differently: TAKE 200mg  IN THE MORNING AND 200mg   AT BEDTIME 08/01/17  Yes Gildardo Cranker, DO  hydrALAZINE (APRESOLINE) 25 MG tablet Take 1 tablet (25 mg total) by mouth 2 (two) times daily. 08/01/17  Yes Carter, Monica, DO  OLANZapine (ZYPREXA)  5 MG tablet Take 1 tablet (5 mg total) by mouth at bedtime. 08/01/17  Yes Eulas Post, Monica, DO  ondansetron (ZOFRAN) 4 MG tablet Take 1 tablet (4 mg total) by mouth every 8 (eight) hours as needed for nausea or vomiting. 12/18/16  Yes Reed, Tiffany L, DO  potassium chloride SA (K-DUR,KLOR-CON) 20 MEQ tablet Take 2 tablets (40 mEq total) by mouth daily. 08/06/17  Yes Gildardo Cranker, DO  silver sulfADIAZINE (SILVADENE) 1 % cream Apply 1 application daily as needed topically (open wound).    Yes [provider]  theophylline (THEO-24) 200 MG 24 hr capsule Take 200 mg every morning by mouth.   Yes [provider]  torsemide (DEMADEX) 20 MG tablet Take 3 tablets (60 mg total) by mouth 2 (two) times daily. 05/08/17  Yes [name], MD  traMADol (ULTRAM) 50 MG tablet Take 1 tablet (50 mg total) by mouth 3 (  three) times daily as needed. Patient taking differently: Take 50 mg 3 (three) times daily as needed by mouth for moderate pain.  08/10/16  Yes [name], MD  Vitamin D, Ergocalciferol, (DRISDOL) 50000 units CAPS capsule Take 50,000 Units every Monday by mouth.    Yes [provider]  Triamcinolone Acetonide (TRIAMCINOLONE 0.1 % CREAM : EUCERIN) CREA Apply 1 application 2 (two) times daily topically. Apply to the rash on arms and upper part of your lower legs 09/04/17   Blanchie Dessert, MD    Family History Family History  Problem Relation Age of Onset  . Diabetes Mother   . Arthritis Father   . Diabetes Paternal Grandfather        entire family on both sides  . Heart disease Maternal Aunt        entire family of both sides  . Cancer Paternal Aunt        type unknown  . Diabetes Sister   . Breast cancer Cousin     Social History Social History   Tobacco Use  . Smoking status: Never Smoker  . Smokeless tobacco: Never Used  Substance Use Topics  . Alcohol use: No  . Drug use: No     Allergies   Aspirin; Codeine; and Latex   Review of Systems Review  of Systems  All other systems reviewed and are negative.    Physical Exam Updated Vital Signs Pulse 80   Temp (!) 97.5 F (36.4 C) (Oral)   Physical Exam  Constitutional: She is oriented to person, place, and time. She appears well-developed and well-nourished. No distress.  HENT:  Head: Normocephalic and atraumatic.  Mouth/Throat: Oropharynx is clear and moist.  Eyes: Conjunctivae and EOM are normal. Pupils are equal, round, and reactive to light.  Neck: Normal range of motion. Neck supple.  Cardiovascular: Normal rate, regular rhythm and intact distal pulses.  No murmur heard. Pulmonary/Chest: Effort normal and breath sounds normal. No respiratory distress. She has no wheezes. She has no rales.  Abdominal: Soft. She exhibits no distension. There is no tenderness. There is no rebound and no guarding.  Musculoskeletal: Normal range of motion. She exhibits edema. She exhibits no tenderness.  Bilateral lower ext edema.  Lymphedema present on the LLE.  Skin changes of chronic venous stasis bilaterally.  Also bilateral crusty, excoriated thickened rash over bilateral lower ext.  No erythema, induration or warmth.   Upper ext with evidence of eczema over the extensor and flexor surfaces.  Nothing on back or abd.  Neurological: She is alert and oriented to person, place, and time.  Skin: Skin is warm and dry. Rash noted. No erythema.  Psychiatric: She has a normal mood and affect. Her behavior is normal.  Nursing note and vitals reviewed.    ED Treatments / Results  Labs (all labs ordered are listed, but only abnormal results are displayed) Labs Reviewed  COMPREHENSIVE METABOLIC PANEL - Abnormal; Notable for the following components:      Result Value   Albumin 3.1 (*)    ALT 12 (*)    GFR calc non Af Amer 51 (*)    GFR calc Af Amer 59 (*)    All other components within normal limits  ACETAMINOPHEN LEVEL - Abnormal; Notable for the following components:   Acetaminophen  (Tylenol), Serum <10 (*)    All other components within normal limits  CBC - Abnormal; Notable for the following components:   RBC 3.40 (*)    Hemoglobin  8.9 (*)    HCT 28.9 (*)    All other components within normal limits  ETHANOL  SALICYLATE LEVEL  RAPID URINE DRUG SCREEN, HOSP PERFORMED    EKG  EKG Interpretation None       Radiology No results found.  Procedures Procedures (including critical care time)  Medications Ordered in ED Medications - No data to display   Initial Impression / Assessment and Plan / ED Course  I have reviewed the triage vital signs and the nursing notes.  Pertinent labs & imaging results that were available during my care of the patient were reviewed by me and considered in my medical decision making (see chart for details).     Patient presenting with vague complaints and concerns about having herpes or TB.  However patient has no complaints concerning for those things.  Vital signs are within normal limits.  Lung sounds are clear and patient denies any infectious symptoms.  Patient does have significant eczema on her upper extremities and possible eczema over the lower extremities versus other type of rash.  There is no evidence of cellulitis but could have a fungal component to the rash versus just bad eczema.  For mental health issues.  Feel that patient is safe for discharge home.  Recommended following up with her PCP and possible dermatology referral for the rash on her lower legs.  Final Clinical Impressions(s) / ED Diagnoses   Final diagnoses:  Eczema of both upper extremities  Rash of unknown cause    ED Discharge Orders        Ordered    Triamcinolone Acetonide (TRIAMCINOLONE 0.1 % CREAM : EUCERIN) CREA  2 times daily     09/04/17 1837       Blanchie Dessert, MD 09/04/17 1903

## 2017-09-04 NOTE — ED Triage Notes (Signed)
Pt states people at her work told her she has "herpes and TB." Pt has hx of psychosis and delusions. Denies any symptoms or complaints.

## 2017-09-18 ENCOUNTER — Encounter: Payer: Self-pay | Admitting: Internal Medicine

## 2017-09-18 ENCOUNTER — Ambulatory Visit (INDEPENDENT_AMBULATORY_CARE_PROVIDER_SITE_OTHER): Payer: Medicare Other | Admitting: Internal Medicine

## 2017-09-18 ENCOUNTER — Telehealth: Payer: Self-pay

## 2017-09-18 VITALS — BP 144/82 | HR 73 | Temp 98.2°F | Ht <= 58 in | Wt 138.9 lb

## 2017-09-18 DIAGNOSIS — M2550 Pain in unspecified joint: Secondary | ICD-10-CM

## 2017-09-18 DIAGNOSIS — F22 Delusional disorders: Secondary | ICD-10-CM

## 2017-09-18 DIAGNOSIS — R110 Nausea: Secondary | ICD-10-CM

## 2017-09-18 DIAGNOSIS — G8929 Other chronic pain: Secondary | ICD-10-CM | POA: Diagnosis not present

## 2017-09-18 DIAGNOSIS — L309 Dermatitis, unspecified: Secondary | ICD-10-CM | POA: Diagnosis not present

## 2017-09-18 DIAGNOSIS — I890 Lymphedema, not elsewhere classified: Secondary | ICD-10-CM

## 2017-09-18 MED ORDER — ALPRAZOLAM 0.5 MG PO TABS: ORAL_TABLET | ORAL | 3 refills | Status: DC

## 2017-09-18 MED ORDER — TRAMADOL HCL 50 MG PO TABS: 50.0000 mg | ORAL_TABLET | Freq: Three times a day (TID) | ORAL | 1 refills | Status: DC | PRN

## 2017-09-18 MED ORDER — ZOSTER VAC RECOMB ADJUVANTED 50 MCG/0.5ML IM SUSR: 0.5000 mL | Freq: Once | INTRAMUSCULAR | 1 refills | Status: AC

## 2017-09-18 MED ORDER — ONDANSETRON HCL 4 MG PO TABS: 4.0000 mg | ORAL_TABLET | Freq: Three times a day (TID) | ORAL | 1 refills | Status: DC | PRN

## 2017-09-18 MED ORDER — METHYLPREDNISOLONE ACETATE 40 MG/ML IJ SUSP
40.0000 mg | Freq: Once | INTRAMUSCULAR | Status: AC
  Administered 2017-09-18: 40 mg via INTRAMUSCULAR

## 2017-09-18 NOTE — Telephone Encounter (Signed)
Mariann Laster returned call and volunteered to pick patient up. Mariann Laster stated she spoke with patient earlier and told her to let her know when her appointment was and she would bring her and the patient said ok yet decided to call [facility] without consulting with anyone.  [name] returned called and stated he told [place] that they should cancel appointment since he would be out of town and unable to bring her. Reathel told him she had someone else in the family to bring her,possibly her niece. [name] was surprised that patient contacted [facility] for transportation to appointment.  [name] will call tomorrow to schedule nurse visit for Prolia injection. [name] confirmed refills that patient mentioned she needed was a valid request
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
## 2017-09-18 NOTE — Patient Instructions (Addendum)
Continue topical triamcinolone cream to rash in both arms as ordered  Continue clotrimazole-betamethasone cream to both legs as ordered  Depo-medrol 40mg  injection given today for itching and joint pain  Follow up in 2 mos for paranoid delusions, chronic joint pain, rash, osteoporosis and lymphedema

## 2017-09-18 NOTE — Telephone Encounter (Signed)
Patient was in office today for a 1 month follow-up, patient arrived via [place] and indicated no one in her family would bring her to appointment.  I worked patient up per protocol, I thoroughly reviewed medication list and patient appeared knowledgeable of all medications. Patient indicated which refills she needed and where they were to be sent (some to a local pharmacy and some to a mail order pharmacy). Patient also agreed to Shingrix vaccine.  Dr.Carter then mentioned prior to seeing patient that I need to follow-up with her Desma Paganini for he manages medications and usually accompanies patient to appointments. Dr.Carter asked that I shred rx for Shingrix for patient needs assistance making any decisions.  Dr.Carter expressed that patient should not come to appointments alone due to current medical diagnosis of paranoid delusion.  I called Desma Paganini x 2, Left message on voicemail for requesting return call when available  I also left message on voicemail for patient's daughter Mariann Laster to return call when available

## 2017-09-28 ENCOUNTER — Ambulatory Visit (INDEPENDENT_AMBULATORY_CARE_PROVIDER_SITE_OTHER): Payer: Medicare Other

## 2017-09-28 DIAGNOSIS — M810 Age-related osteoporosis without current pathological fracture: Secondary | ICD-10-CM | POA: Diagnosis not present

## 2017-09-28 MED ORDER — DENOSUMAB 60 MG/ML ~~LOC~~ SOLN
60.0000 mg | Freq: Once | SUBCUTANEOUS | Status: AC
  Administered 2017-09-28: 60 mg via SUBCUTANEOUS

## 2017-10-18 ENCOUNTER — Encounter: Payer: Self-pay | Admitting: Internal Medicine

## 2017-10-26 ENCOUNTER — Encounter: Payer: Self-pay | Admitting: Internal Medicine

## 2017-11-07 ENCOUNTER — Ambulatory Visit (INDEPENDENT_AMBULATORY_CARE_PROVIDER_SITE_OTHER): Payer: Medicare Other | Admitting: Podiatry

## 2017-11-07 ENCOUNTER — Encounter: Payer: Self-pay | Admitting: Podiatry

## 2017-11-07 DIAGNOSIS — I129 Hypertensive chronic kidney disease with stage 1 through stage 4 chronic kidney disease, or unspecified chronic kidney disease: Secondary | ICD-10-CM | POA: Diagnosis not present

## 2017-11-07 DIAGNOSIS — N183 Chronic kidney disease, stage 3 (moderate): Secondary | ICD-10-CM | POA: Diagnosis not present

## 2017-11-07 DIAGNOSIS — B351 Tinea unguium: Secondary | ICD-10-CM | POA: Diagnosis not present

## 2017-11-07 DIAGNOSIS — N2581 Secondary hyperparathyroidism of renal origin: Secondary | ICD-10-CM | POA: Diagnosis not present

## 2017-11-07 DIAGNOSIS — M79676 Pain in unspecified toe(s): Secondary | ICD-10-CM

## 2017-11-07 DIAGNOSIS — D638 Anemia in other chronic diseases classified elsewhere: Secondary | ICD-10-CM | POA: Diagnosis not present

## 2017-11-07 NOTE — Progress Notes (Signed)
Complaint:  Visit Type: Patient returns to my office for continued preventative foot care services. Complaint: Patient states" my nails have grown long and thick and become painful to walk and wear shoes" Patient has been diagnosed with DM with severe swelling both legs and feet. The patient presents for preventative foot care services. No changes to ROS  Podiatric Exam: Vascular: dorsalis pedis and posterior tibial pulses are not  palpable bilateral due to her severe swelling. Capillary return is immediate. Temperature gradient is WNL.  Sensorium: Normal Semmes Weinstein monofilament test. Normal tactile sensation bilaterally. Nail Exam: Pt has thick disfigured discolored nails with subungual debris noted bilateral entire nail hallux through fifth toenails Ulcer Exam: There is no evidence of ulcer or pre-ulcerative changes or infection. Orthopedic Exam: Muscle tone and strength are WNL. No limitations in general ROM. No crepitus or effusions noted. Foot type and digits show no abnormalities. Bony prominences are unremarkable. Severe swelling, almost elephantitis noted. Skin: No Porokeratosis. No infection or ulcers  Diagnosis:  Onychomycosis, , Pain in right toe, pain in left toes  Treatment & Plan Procedures and Treatment: Consent by patient was obtained for treatment procedures. The patient understood the discussion of treatment and procedures well. All questions were answered thoroughly reviewed. Debridement of mycotic and hypertrophic toenails, 1 through 5 bilateral and clearing of subungual debris. No ulceration, no infection noted.  ABN signed for 2019.  To be evaluated by Liliane Channel for shoes. Return Visit-Office Procedure: Patient instructed to return to the office for a follow up visit 3 months for continued evaluation and treatment.    Gardiner Barefoot DPM

## 2017-11-21 ENCOUNTER — Ambulatory Visit: Payer: Medicare Other | Admitting: Internal Medicine

## 2017-12-03 ENCOUNTER — Other Ambulatory Visit: Payer: Medicare Other | Admitting: Orthotics

## 2017-12-04 ENCOUNTER — Ambulatory Visit: Payer: Medicare Other | Admitting: Internal Medicine

## 2017-12-05 ENCOUNTER — Ambulatory Visit (INDEPENDENT_AMBULATORY_CARE_PROVIDER_SITE_OTHER): Payer: Medicare Other | Admitting: Internal Medicine

## 2017-12-05 ENCOUNTER — Encounter: Payer: Self-pay | Admitting: Internal Medicine

## 2017-12-05 VITALS — BP 138/80 | HR 67 | Temp 98.4°F | Ht <= 58 in | Wt 141.0 lb

## 2017-12-05 DIAGNOSIS — F22 Delusional disorders: Secondary | ICD-10-CM

## 2017-12-05 DIAGNOSIS — M2550 Pain in unspecified joint: Secondary | ICD-10-CM

## 2017-12-05 DIAGNOSIS — I890 Lymphedema, not elsewhere classified: Secondary | ICD-10-CM

## 2017-12-05 DIAGNOSIS — R110 Nausea: Secondary | ICD-10-CM

## 2017-12-05 DIAGNOSIS — G8929 Other chronic pain: Secondary | ICD-10-CM

## 2017-12-05 DIAGNOSIS — R1013 Epigastric pain: Secondary | ICD-10-CM

## 2017-12-05 MED ORDER — TRAMADOL HCL 50 MG PO TABS: 50.0000 mg | ORAL_TABLET | Freq: Three times a day (TID) | ORAL | 0 refills | Status: DC | PRN

## 2017-12-05 MED ORDER — ALPRAZOLAM 0.5 MG PO TABS: ORAL_TABLET | ORAL | 1 refills | Status: DC

## 2017-12-05 MED ORDER — ONDANSETRON HCL 4 MG PO TABS: 4.0000 mg | ORAL_TABLET | Freq: Three times a day (TID) | ORAL | 6 refills | Status: DC | PRN

## 2017-12-05 MED ORDER — RANITIDINE HCL 150 MG PO TABS
150.0000 mg | ORAL_TABLET | Freq: Every day | ORAL | 2 refills | Status: DC
Start: 2017-12-05 — End: 2018-05-21

## 2017-12-05 NOTE — Progress Notes (Signed)
Patient ID: [name], female   DOB: January 30, 1932, 84 y.o.   MRN: [id number]   Location:  [facility] OFFICE  Provider: DR Arletha Grippe  Code Status: FULL CODE Goals of Care:  Advanced Directives 09/18/2017  Does Patient Have a Medical Advance Directive? No  Type of Advance Directive -  Does patient want to make changes to medical advance directive? -  Copy of [place] in Chart? -  Would patient like information on creating a medical advance directive? No - Patient declined     Chief Complaint  Patient presents with  . Follow-up    2 month follow-up, here with [facility]   . Medication Management    Refill medications Xanax and Tramadol (30 day supply at local and 90 day supply at [place]), also refill mouthwash and zofram (mailorder)     HPI: Patient is a 84 y.o. female seen today for medical management of chronic diseases.  Pt is a poor historian due to psych d/o. Hx obtained from chart and family. She reports intermittent nausea, feeling tired and "wore out". She gets dizzy easily. Her niece is present today and has been staying with her the last several days. She has kept her legs wrapped and continues to decline wound care clinic.  Chronic LBP/generalized joint pain  - stable on tramadol and applies topical agents. 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 DXA 06/2017 revealed osteoporosis with T score -2.6. She is taking Ca w D 1200mg  daily and Prolia injections  Chronic systolic HF - asymptomatic. stable on coreg, torsemide + potassium and hydralazine. Followed by cardio Dr Daneen Schick. EF 55-60% (EF 35-40% by 2D echo in 2015). Prior echo also revealed septal wall akinesis/severe hypokinesis; mod-sev diffuse hypokinesis.  Peripheral neuropathy - stable on gabapentin. Followed by neurology Dr Tomi Likens  Venous insufficiency/chronic LE lymphedema - stable on torsemide, clotrimazole/betamethasone cream, silvadene cream. She does not  follow with lymphedema clinic nor use her TED stockings. Her niece has been wrapping her legs the last several days  Psychosis/ hallucinations - stable. no new behavioral disturbances and no formal psych evaluation. Family not willing for counseling at this time. She lives alone and gets meals on wheels. family stays with her at night most days of the week and she gets some PCS services. Per [name], she is taking zyprexa 5mg  qhs   Past Medical History:  Diagnosis Date  . Anemia, unspecified   . Anxiety   . Arthritis   . Asthma   . Delusions ([place])   . Depression   . Edema 2005   "since put the glass in them"   . GERD (gastroesophageal reflux disease)   . Lumbago   . Lymphedema   . Obstructive chronic bronchitis without exacerbation ([place])   . Other abnormal glucose   . 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 Systolic heart failure (H[facility])    EF 35 to 40% per echo January 2015  . Unspecified essential hypertension   . Unspecified nonpsychotic mental disorder   . Unspecified venous (peripheral) insufficiency   . Unspecified venous (peripheral) insufficiency   . Unspecified vitamin D deficiency     Past Surgical History:  Procedure Laterality Date  . APPENDECTOMY    . CERVICAL SPINE SURGERY     x 2  . CHOLECYSTECTOMY    . ESOPHAGOGASTRODUODENOSCOPY N/A 04/05/2015   Procedure: ESOPHAGOGASTRODUODENOSCOPY (EGD);  Surgeon: [name], MD;  Location: [facility];  Service: Endoscopy;  Laterality: N/A;  .  FLEXIBLE SIGMOIDOSCOPY N/A 04/05/2015   Procedure: FLEXIBLE SIGMOIDOSCOPY;  Surgeon: [name], MD;  Location: [facility];  Service: Endoscopy;  Laterality: N/A;  . HAND SURGERY    . LUMBAR DISC SURGERY    . PARTIAL HYSTERECTOMY    . TONSILLECTOMY       reports that  has never smoked. she has never used smokeless tobacco. She reports that she does not drink alcohol or use drugs. Social History   Socioeconomic History  . Marital status: Widowed    Spouse name: Not on file    . Number of children: 0  . Years of education: Not on file  . Highest education level: Not on file  Social Needs  . Financial resource strain: Not on file  . Food insecurity - worry: Not on file  . Food insecurity - inability: Not on file  . Transportation needs - medical: Not on file  . Transportation needs - non-medical: Not on file  Occupational History  . Occupation: retired  Tobacco Use  . Smoking status: Never Smoker  . Smokeless tobacco: Never Used  Substance and Sexual Activity  . Alcohol use: No  . Drug use: No  . Sexual activity: Not Currently  Other Topics Concern  . Not on file  Social History Narrative   Diet? Good, low appetite      Do you drink/eat things with caffeine? no      Marital status?                  widowed                  What year were you married?      Do you live in a house, apartment, assisted living, condo, trailer, etc.? house      Is it one or more stories? 1 story      How many persons live in your home? Only me      Do you have any pets in your home? (please list) no      Current or past profession: lab tech      Do you exercise?        some                              Type & how often? Foot rolls, some walking      Do you have a living will? yes      Do you have a DNR form?    no                              If not, do you want to discuss one?      Do you have signed POA/HPOA for forms? yes    Family History  Problem Relation Age of Onset  . Diabetes Mother   . Arthritis Father   . Diabetes Paternal Grandfather        entire family on both sides  . Heart disease Maternal Aunt        entire family of both sides  . Cancer Paternal Aunt        type unknown  . Diabetes Sister   . Breast cancer Cousin     Allergies  Allergen Reactions  . Aspirin Other (See Comments)    REACTION: upset stomach  . Codeine Other (See Comments)    REACTION: "MAKES  MY BODY GO CRAZY; CRAMPS"  . Latex Nausea And Vomiting    Outpatient  Encounter Medications as of 12/05/2017  Medication Sig  . acetaminophen (TYLENOL 8 HOUR) 650 MG CR tablet Take 1 tablet (650 mg total) by mouth every 8 (eight) hours as needed for pain.  [name] albuterol (PROAIR HFA) 108 (90 Base) MCG/ACT inhaler INHALE 2 PUFFS EVERY 4 HOURS AS NEEDED FOR WHEEZING  . ALPRAZolam (XANAX) 0.5 MG tablet Take 1/2 to 1 tablet by mouth three times daily as needed for nerves  . Ascorbic Acid (VITAMIN C) 1000 MG tablet Take 2,000 mg by mouth daily.  . carvedilol (COREG) 6.25 MG tablet TAKE 1 TABLET BY MOUTH TWICE DAILY  . clotrimazole-betamethasone (LOTRISONE) cream Apply 1 application topically 2 (two) times daily. To your legs  . Ferrous Sulfate Dried 200 (65 FE) MG TABS Take 1 tablet by mouth 2 (two) times daily.  . fluticasone-salmeterol (ADVAIR HFA) 115-21 MCG/ACT inhaler USE 2 INHALATIONS TWICE A DAY  . gabapentin (NEURONTIN) 100 MG capsule TAKE 2 CAPSULES IN THE MORNING AND 2 CAPSULES AT BEDTIME  . hydrALAZINE (APRESOLINE) 25 MG tablet Take 1 tablet (25 mg total) by mouth 2 (two) times daily.  [name] OLANZapine (ZYPREXA) 5 MG tablet Take 1 tablet (5 mg total) by mouth at bedtime.  . ondansetron (ZOFRAN) 4 MG tablet Take 1 tablet (4 mg total) by mouth every 8 (eight) hours as needed for nausea or vomiting.  . potassium chloride SA (K-DUR,KLOR-CON) 20 MEQ tablet Take 2 tablets (40 mEq total) by mouth daily.  . silver sulfADIAZINE (SILVADENE) 1 % cream Apply 1 application daily as needed topically (open wound).   . theophylline (THEO-24) 200 MG 24 hr capsule Take 200 mg every morning by mouth.  . torsemide (DEMADEX) 20 MG tablet Take 3 tablets (60 mg total) by mouth 2 (two) times daily.  . traMADol (ULTRAM) 50 MG tablet Take 1 tablet (50 mg total) by mouth 3 (three) times daily as needed.  . Triamcinolone Acetonide (TRIAMCINOLONE 0.1 % CREAM : EUCERIN) CREA Apply 1 application 2 (two) times daily topically. Apply to the rash on arms and upper part of your lower legs  . Vitamin D,  Ergocalciferol, (DRISDOL) 50000 units CAPS capsule Take 50,000 Units every Monday by mouth.   . Diphenhyd-Hydrocort-Nystatin (FIRST-DUKES MOUTHWASH) SUSP 1 tsp gargle and swallow three times daily as needed (Patient not taking: Reported on 12/05/2017)   No facility-administered encounter medications on file as of 12/05/2017.     Review of Systems:  Review of Systems  Unable to perform ROS: Psychiatric disorder    Health Maintenance  Topic Date Due  . COLONOSCOPY  08/25/2015  . PNA vac Low Risk Adult (2 of 2 - PPSV23) 11/24/2017  . OT PLAN OF CARE  07/29/2018 (Originally 1932-08-07)  . TETANUS/TDAP  04/06/2025  . INFLUENZA VACCINE  Completed  . DEXA SCAN  Completed    Physical Exam: Vitals:   12/05/17 1428  BP: 138/80  Pulse: 67  Temp: 98.4 F (36.9 C)  TempSrc: Oral  SpO2: 98%  Weight: 141 lb (64 kg)  Height: 4\' 10"  (1.473 m)   Body mass index is 29.47 kg/m. Physical Exam  Constitutional: She appears well-developed and well-nourished.  HENT:  Mouth/Throat: Oropharynx is clear and moist. No oropharyngeal exudate.  MMM; no oral thrush  Eyes: Pupils are equal, round, and reactive to light. No scleral icterus.  Neck: Neck supple. Carotid bruit is not present. No tracheal deviation present. No thyromegaly present.  Cardiovascular: Normal rate, regular rhythm and intact distal pulses. Exam reveals no gallop and no friction rub.  Murmur (1/6 SEM) heard. +2 BLE edema with chronic venous stasis changes. No calf TTP  Pulmonary/Chest: Effort normal and breath sounds normal. No stridor. No respiratory distress. She has no wheezes. She has no rales.  Abdominal: Soft. Normal appearance and bowel sounds are normal. She exhibits distension. She exhibits no mass. There is no hepatomegaly. There is tenderness (epigastric). There is no rigidity, no rebound and no guarding. No hernia.  Musculoskeletal: She exhibits edema.  Lymphadenopathy:    She has no cervical adenopathy.  Neurological:  She is alert.  Skin: Skin is warm and dry. No rash noted.  Psychiatric: She has a normal mood and affect. Her behavior is normal. Thought content normal.    Labs reviewed: Basic Metabolic Panel: Recent Labs    04/20/17 1620 05/12/17 1717 09/04/17 1558  NA 137 137 138  K 3.5 4.6 4.2  CL 100 103 109  CO2 27 26 23   GLUCOSE 130* 74 77  BUN 31* 30* 11  CREATININE 1.49* 1.49* 0.99  CALCIUM 9.3 9.7 9.1   Liver Function Tests: Recent Labs    04/20/17 1620 05/12/17 1717 09/04/17 1558  AST 14 20 21   ALT 9 12* 12*  ALKPHOS 65 59 52  BILITOT 0.3 0.6 0.4  PROT 8.4* 8.6* 8.1  ALBUMIN 3.6 3.6 3.1*   No results for input(s): LIPASE, AMYLASE in the last 8760 hours. No results for input(s): AMMONIA in the last 8760 hours. CBC: Recent Labs    05/12/17 1717 09/04/17 1558  WBC 5.1 5.2  HGB 9.2* 8.9*  HCT 29.1* 28.9*  MCV 84.6 85.0  PLT 202 253   Lipid Panel: No results for input(s): CHOL, HDL, LDLCALC, TRIG, CHOLHDL, LDLDIRECT in the last 8760 hours. Lab Results  Component Value Date   HGBA1C 5.5 04/14/2014    Procedures since last visit: No results found.  Assessment/Plan   ICD-10-CM   1. Lymphedema of both lower extremities I89.0   2. Nausea R11.0 ondansetron (ZOFRAN) 4 MG tablet    ranitidine (ZANTAC) 150 MG tablet  3. Chronic pain of multiple joints M25.50 traMADol (ULTRAM) 50 MG tablet   G89.29   4. Paranoid delusion ([place]) F22 ALPRAZolam (XANAX) 0.5 MG tablet  5. Dyspepsia R10.13 ranitidine (ZANTAC) 150 MG tablet    F/U WITH LYMPHEDEMA CLINIC  START ZANTAC 150MG  DAILY FOR STOMACH PAIN/ACID REFLUX  Continue other medications as ordered  Follow up with specialists as scheduled  Follow up in 2 mos for acid reflux, lymphedema, LBP, CHF   Elli Groesbeck S. Perlie Gold  [facility] [address] [place], [place] [address] [phone] Cell (Monday-Friday 8 AM - 5 PM) [phone] After 5 PM and follow prompts

## 2017-12-05 NOTE — Patient Instructions (Addendum)
RECOMMEND YOU CALL WOUND CARE CLINIC IN [place] FOR CARE OF YOUR LYMPHEDEMA  START ZANTAC 150MG  DAILY FOR STOMACH PAIN/ACID REFLUX  Continue other medications as ordered  Follow up with specialists as scheduled  Follow up in 2 mos for acid reflux, lymphedema, LBP, CHF

## 2017-12-06 ENCOUNTER — Other Ambulatory Visit: Payer: Self-pay | Admitting: *Deleted

## 2017-12-06 MED ORDER — SILVER SULFADIAZINE 1 % EX CREA: 1.0000 "application " | TOPICAL_CREAM | Freq: Every day | CUTANEOUS | 0 refills | Status: DC | PRN

## 2017-12-06 MED ORDER — HYDRALAZINE HCL 25 MG PO TABS: 25.0000 mg | ORAL_TABLET | Freq: Two times a day (BID) | ORAL | 0 refills | Status: DC

## 2017-12-06 NOTE — Telephone Encounter (Signed)
Patient called requesting refill

## 2018-01-01 ENCOUNTER — Other Ambulatory Visit: Payer: Medicare Other

## 2018-01-08 ENCOUNTER — Other Ambulatory Visit: Payer: Medicare Other

## 2018-01-20 IMAGING — US US RENAL
1 series · 14 of 25 positions shown · non-contrast
Comparison: None

CLINICAL DATA: Stage III chronic kidney disease, essential
hypertension, asthma, systolic heart failure, diabetes mellitus

EXAM:
RENAL / URINARY TRACT ULTRASOUND COMPLETE

[Series 1: us renal · 0.20mm/px · 14 of 30 slices shown]
[im 1/30]
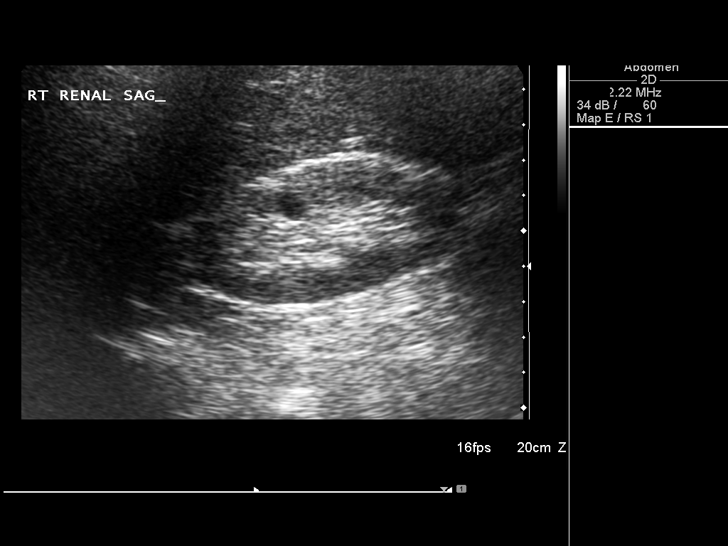
[im 3/30]
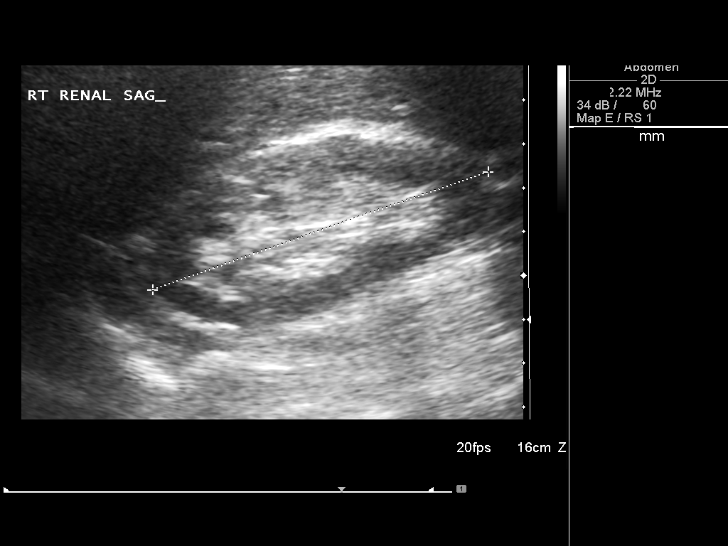
[im 5/30]
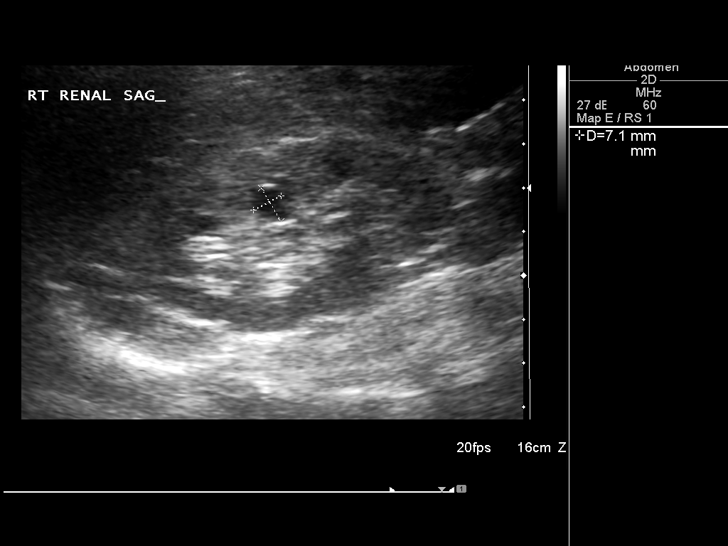
[im 8/30]
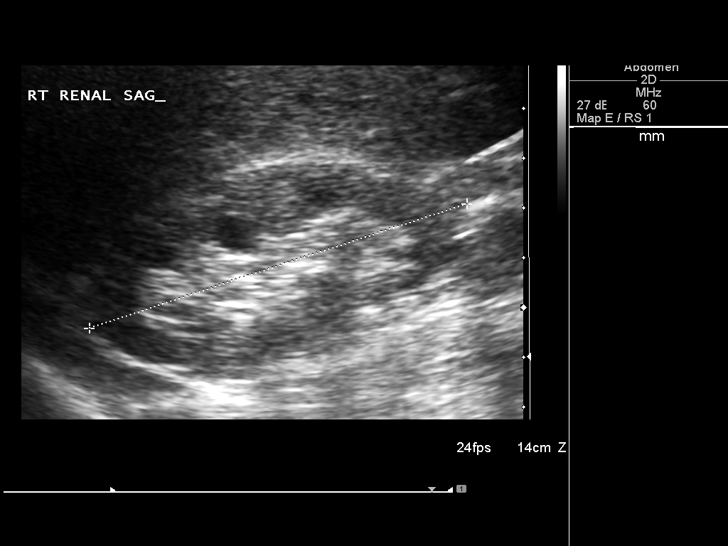
[im 10/30]
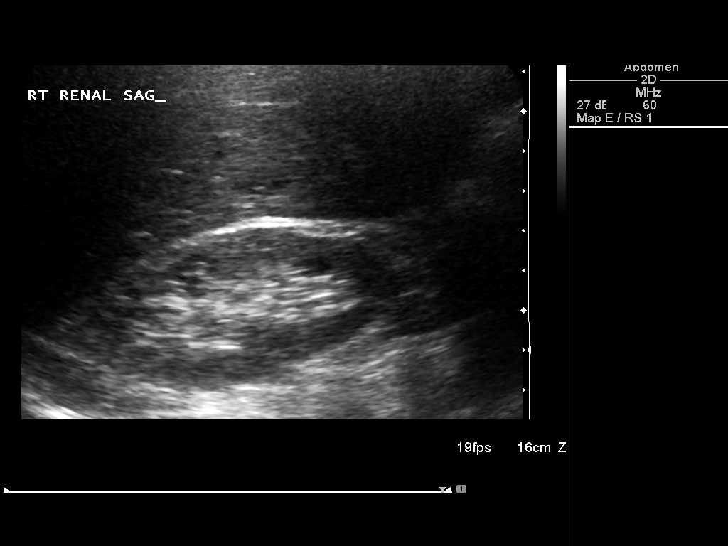
[im 11/30]
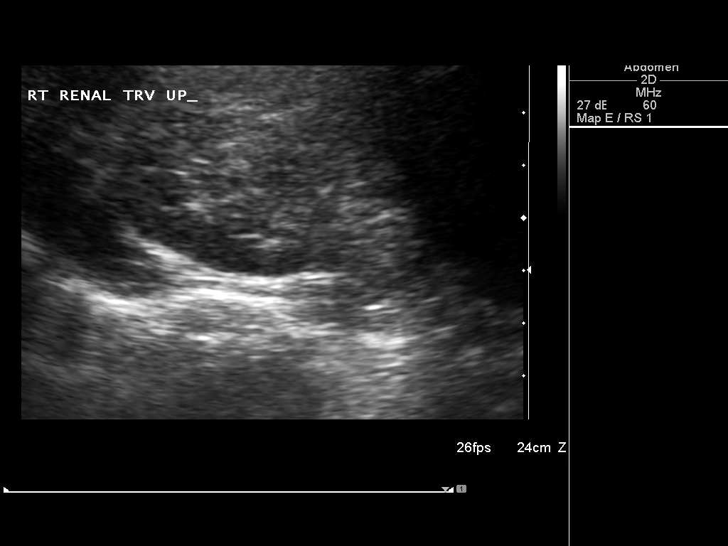
[im 14/30]
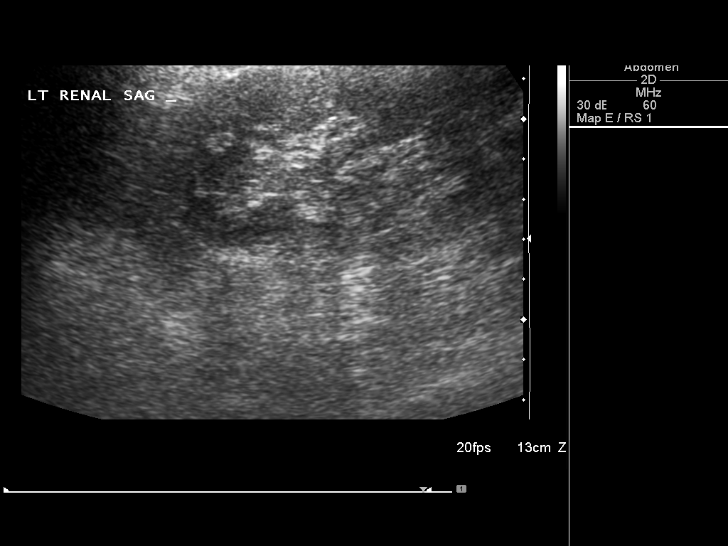
[im 16/30]
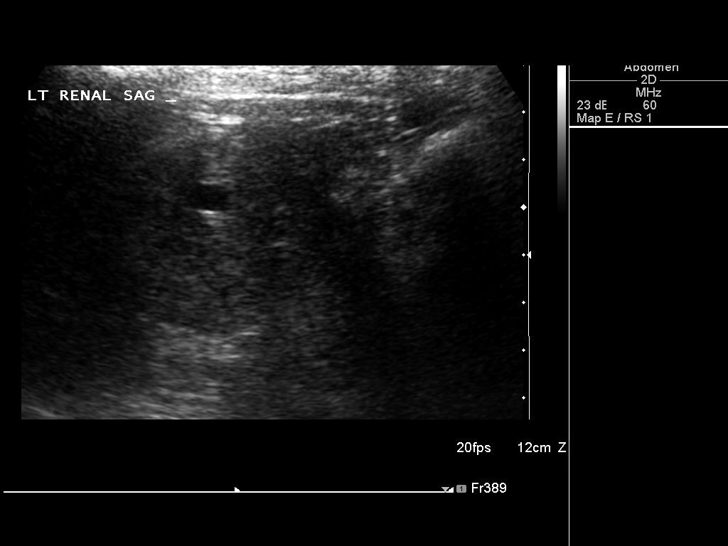
[im 19/30]
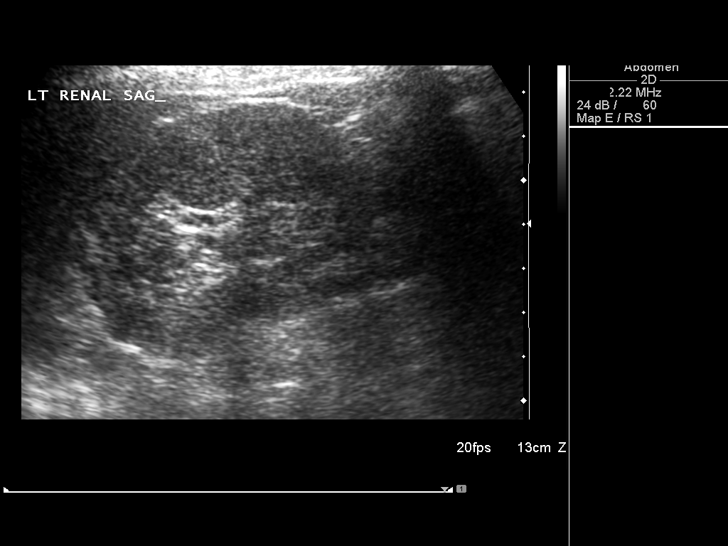
[im 20/30]
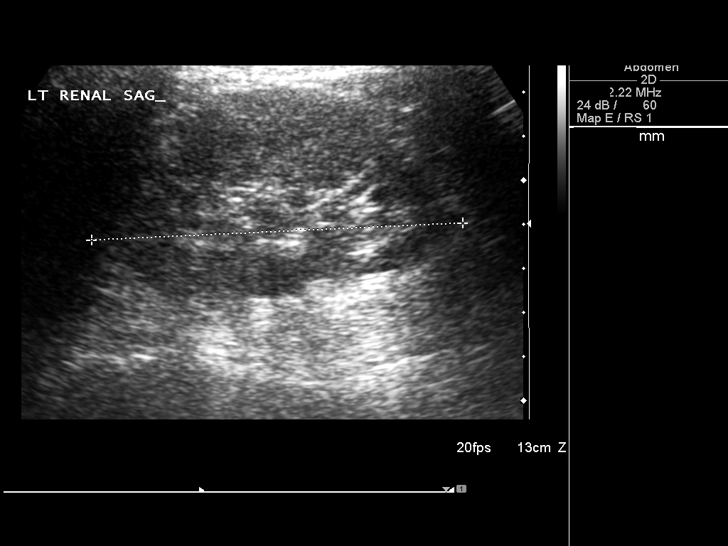
[im 22/30]
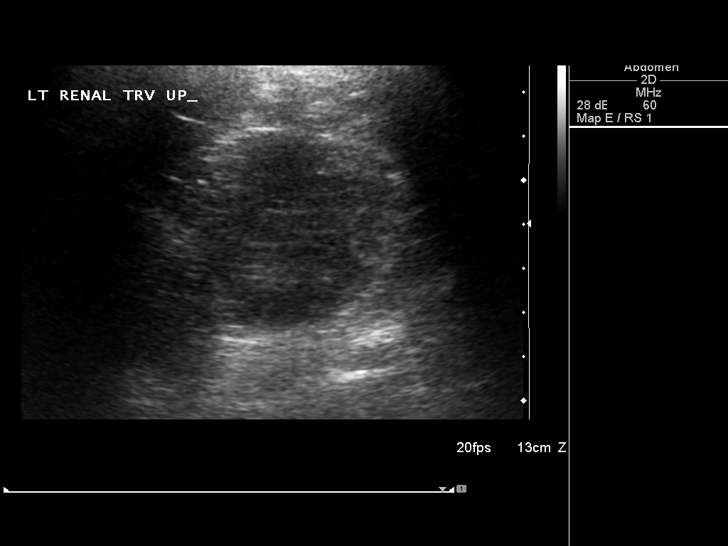
[im 25/30]
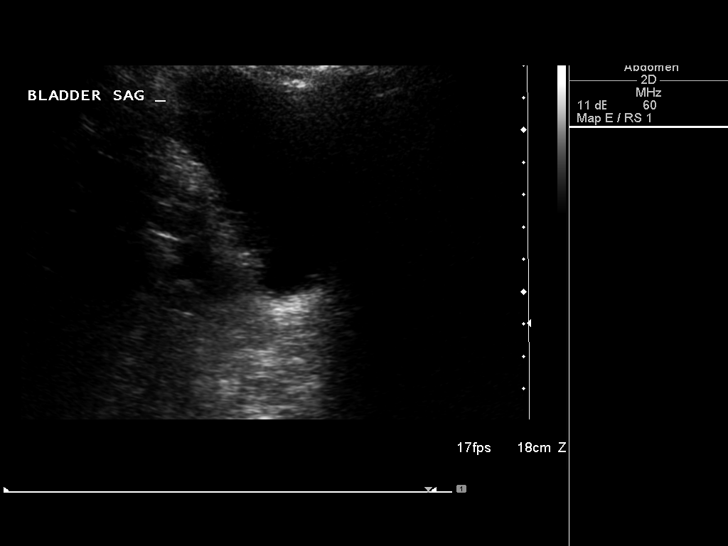
[im 27/30]
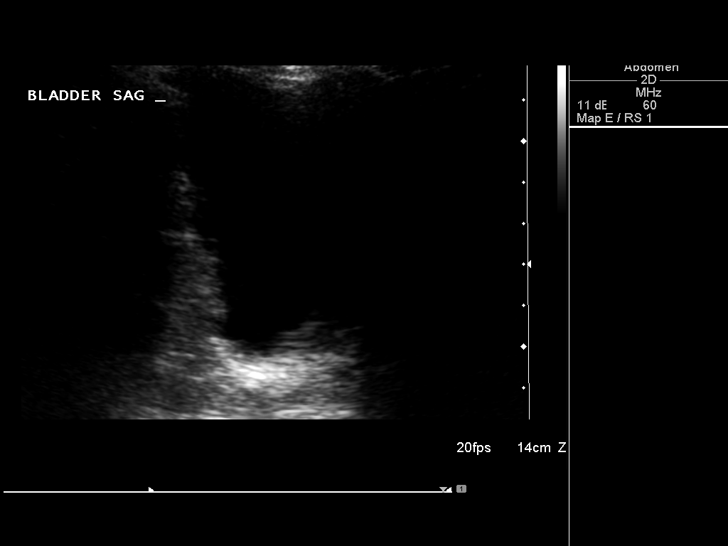
[im 30/30]
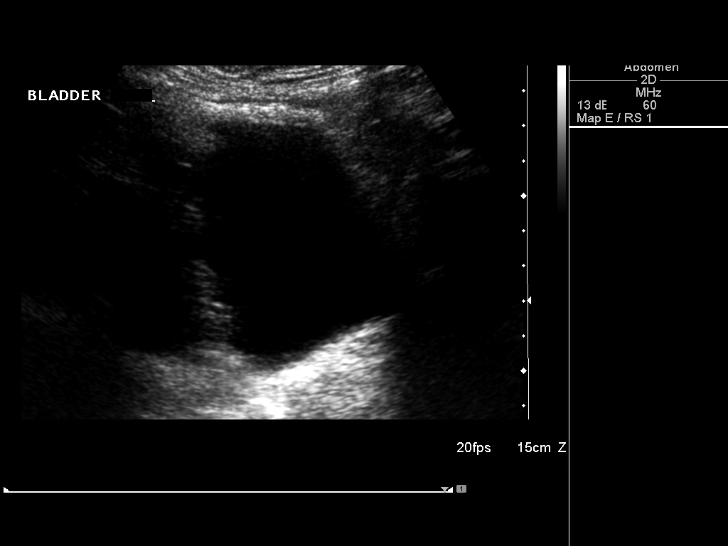

[14 of 25 positions shown; findings below may reference images not displayed]

FINDINGS: Right Kidney:

Length: 8.6 cm. Cortical thinning. Increased cortical echogenicity.
Question small cyst at midpole 7 x 9 x 9 mm. No additional mass,
hydronephrosis or shadowing calcification.

Left Kidney:

Length: 8.4 cm. Increased cortical echogenicity. Grossly normal
cortical thickness. Tiny cyst mid kidney 10 x 8 x 8 mm. No
additional mass, hydronephrosis or shadowing calcification.

Bladder:

Well distended, normal appearance.
IMPRESSION: Medical renal disease changes of both kidneys.

Tiny probable BILATERAL renal cysts.

Otherwise negative exam.

## 2018-02-06 ENCOUNTER — Ambulatory Visit: Payer: Medicare Other | Admitting: Podiatry

## 2018-02-12 ENCOUNTER — Encounter: Payer: Self-pay | Admitting: Internal Medicine

## 2018-02-12 ENCOUNTER — Ambulatory Visit (INDEPENDENT_AMBULATORY_CARE_PROVIDER_SITE_OTHER): Payer: Medicare Other | Admitting: Internal Medicine

## 2018-02-12 VITALS — BP 138/70 | HR 73 | Temp 98.8°F | Ht <= 58 in | Wt 143.2 lb

## 2018-02-12 DIAGNOSIS — I872 Venous insufficiency (chronic) (peripheral): Secondary | ICD-10-CM | POA: Diagnosis not present

## 2018-02-12 DIAGNOSIS — L03115 Cellulitis of right lower limb: Secondary | ICD-10-CM

## 2018-02-12 DIAGNOSIS — G8929 Other chronic pain: Secondary | ICD-10-CM

## 2018-02-12 DIAGNOSIS — F22 Delusional disorders: Secondary | ICD-10-CM | POA: Diagnosis not present

## 2018-02-12 DIAGNOSIS — I890 Lymphedema, not elsewhere classified: Secondary | ICD-10-CM | POA: Diagnosis not present

## 2018-02-12 DIAGNOSIS — M2550 Pain in unspecified joint: Secondary | ICD-10-CM | POA: Diagnosis not present

## 2018-02-12 DIAGNOSIS — I10 Essential (primary) hypertension: Secondary | ICD-10-CM

## 2018-02-12 DIAGNOSIS — Z79899 Other long term (current) drug therapy: Secondary | ICD-10-CM

## 2018-02-12 LAB — COMPLETE METABOLIC PANEL WITH GFR
AG Ratio: 0.7 (calc) — ABNORMAL LOW (ref 1.0–2.5)
ALBUMIN MSPROF: 3.6 g/dL (ref 3.6–5.1)
ALT: 8 U/L (ref 6–29)
AST: 19 U/L (ref 10–35)
Alkaline phosphatase (APISO): 68 U/L (ref 33–130)
BILIRUBIN TOTAL: 0.4 mg/dL (ref 0.2–1.2)
BUN / CREAT RATIO: 10 (calc) (ref 6–22)
BUN: 17 mg/dL (ref 7–25)
CALCIUM: 9.7 mg/dL (ref 8.6–10.4)
CO2: 25 mmol/L (ref 20–32)
CREATININE: 1.7 mg/dL — AB (ref 0.60–0.88)
Chloride: 103 mmol/L (ref 98–110)
GFR, EST AFRICAN AMERICAN: 32 mL/min/{1.73_m2} — AB (ref >=60)
GFR, EST NON AFRICAN AMERICAN: 27 mL/min/{1.73_m2} — AB (ref >=60)
Globulin: 5.2 g/dL (calc) — ABNORMAL HIGH (ref 1.9–3.7)
Glucose, Bld: 90 mg/dL (ref 65–99)
Potassium: 4.2 mmol/L (ref 3.5–5.3)
Sodium: 137 mmol/L (ref 135–146)
TOTAL PROTEIN: 8.8 g/dL — AB (ref 6.1–8.1)

## 2018-02-12 LAB — CBC WITH DIFFERENTIAL/PLATELET
BASOS PCT: 0.3 %
Basophils Absolute: 23 cells/uL (ref 0–200)
EOS ABS: 23 {cells}/uL (ref 15–500)
Eosinophils Relative: 0.3 %
HCT: 28.8 % — ABNORMAL LOW (ref 35.0–45.0)
HEMOGLOBIN: 9.3 g/dL — AB (ref 11.7–15.5)
Lymphs Abs: 1596 cells/uL (ref 850–3900)
MCH: 26.6 pg — AB (ref 27.0–33.0)
MCHC: 32.3 g/dL (ref 32.0–36.0)
MCV: 82.3 fL (ref 80.0–100.0)
MONOS PCT: 7.5 %
MPV: 9 fL (ref 7.5–12.5)
NEUTROS ABS: 5388 {cells}/uL (ref 1500–7800)
Neutrophils Relative %: 70.9 %
Platelets: 377 10*3/uL (ref 140–400)
RBC: 3.5 10*6/uL — ABNORMAL LOW (ref 3.80–5.10)
RDW: 13.5 % (ref 11.0–15.0)
Total Lymphocyte: 21 %
WBC: 7.6 10*3/uL (ref 3.8–10.8)
WBCMIX: 570 {cells}/uL (ref 200–950)

## 2018-02-12 MED ORDER — DOXYCYCLINE HYCLATE 100 MG PO TABS: 100.0000 mg | ORAL_TABLET | Freq: Two times a day (BID) | ORAL | 0 refills | Status: DC

## 2018-02-12 NOTE — Patient Instructions (Addendum)
START DOXYCYCLINE 100MG  2 TIMES DAILY X 10 DAYS FOR SKIN INFECTION   Take OTC probiotic daily while on doxycycline  Keep legs elevated when seated  Continue other medications as ordered  Will call with lab results  [name]  Follow up in 3 mos for lymphedema, psychosis, HF, chronic pain
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
## 2018-02-18 ENCOUNTER — Other Ambulatory Visit: Payer: Self-pay | Admitting: Internal Medicine

## 2018-02-18 DIAGNOSIS — L03115 Cellulitis of right lower limb: Secondary | ICD-10-CM

## 2018-02-22 ENCOUNTER — Ambulatory Visit: Payer: Medicare Other | Admitting: Podiatry

## 2018-03-29 ENCOUNTER — Ambulatory Visit: Payer: Medicare Other

## 2018-03-29 ENCOUNTER — Encounter: Payer: Self-pay | Admitting: Internal Medicine

## 2018-03-29 ENCOUNTER — Ambulatory Visit (INDEPENDENT_AMBULATORY_CARE_PROVIDER_SITE_OTHER): Payer: Medicare Other

## 2018-03-29 ENCOUNTER — Ambulatory Visit (INDEPENDENT_AMBULATORY_CARE_PROVIDER_SITE_OTHER): Payer: Medicare Other | Admitting: Internal Medicine

## 2018-03-29 ENCOUNTER — Ambulatory Visit: Payer: Self-pay | Admitting: Internal Medicine

## 2018-03-29 VITALS — BP 136/72 | HR 70 | Temp 98.6°F | Ht <= 58 in | Wt 147.0 lb

## 2018-03-29 DIAGNOSIS — Z0000 Encounter for general adult medical examination without abnormal findings: Secondary | ICD-10-CM | POA: Diagnosis not present

## 2018-03-29 DIAGNOSIS — L309 Dermatitis, unspecified: Secondary | ICD-10-CM

## 2018-03-29 DIAGNOSIS — L237 Allergic contact dermatitis due to plants, except food: Secondary | ICD-10-CM

## 2018-03-29 DIAGNOSIS — I890 Lymphedema, not elsewhere classified: Secondary | ICD-10-CM

## 2018-03-29 MED ORDER — CLOTRIMAZOLE-BETAMETHASONE 1-0.05 % EX CREA
1.0000 | TOPICAL_CREAM | Freq: Two times a day (BID) | CUTANEOUS | 3 refills | Status: DC
Start: 2018-03-29 — End: 2018-07-02

## 2018-03-29 MED ORDER — METHYLPREDNISOLONE ACETATE 40 MG/ML IJ SUSP
40.0000 mg | Freq: Once | INTRAMUSCULAR | Status: AC
Start: 2018-03-29 — End: 2018-03-29
  Administered 2018-03-29: 40 mg via INTRAMUSCULAR

## 2018-03-29 MED ORDER — ZOSTER VAC RECOMB ADJUVANTED 50 MCG/0.5ML IM SUSR: 0.5000 mL | Freq: Once | INTRAMUSCULAR | 0 refills | Status: AC

## 2018-03-29 NOTE — Patient Instructions (Addendum)
[name] , Thank you for taking time to come for your Medicare Wellness Visit. I appreciate your ongoing commitment to your health goals. Please review the following plan we discussed and let me know if I can assist you in the future.   Screening recommendations/referrals: Colonoscopy excluded, over age 82 Mammogram excluded, over age 41 Bone Density up to date Recommended yearly ophthalmology/optometry visit for glaucoma screening and checkup Recommended yearly dental visit for hygiene and checkup  Vaccinations: Influenza vaccine up to date, due 2019 fall season Pneumococcal vaccine  Tdap vaccine up to date, due 6/8/026 Shingles vaccine due, prescription sent to pharmacy    Advanced directives: Please bring [place] a copy of your living will and health care power of attorney once it's finished  Conditions/risks identified: none  Next appointment: Dr. Eulas Post 05/14/2018 @ 3:30pm            Tyson Dense, RN 05/05/2019 @ 12:45pm   Preventive Care 65 Years and Older, Female Preventive care refers to lifestyle choices and visits with your health care provider that can promote health and wellness. What does preventive care include?  A yearly physical exam. This is also called an annual well check.  Dental exams once or twice a year.  Routine eye exams. Ask your health care provider how often you should have your eyes checked.  Personal lifestyle choices, including:  Daily care of your teeth and gums.  Regular physical activity.  Eating a healthy diet.  Avoiding tobacco and drug use.  Limiting alcohol use.  Practicing safe sex.  Taking low-dose aspirin every day.  Taking vitamin and mineral supplements as recommended by your health care provider. What happens during an annual well check? The services and screenings done by your health care provider during your annual well check will depend on your age, overall health, lifestyle risk factors, and family history of  disease. Counseling  Your health care provider may ask you questions about your:  Alcohol use.  Tobacco use.  Drug use.  Emotional well-being.  Home and relationship well-being.  Sexual activity.  Eating habits.  History of falls.  Memory and ability to understand (cognition).  Work and work Statistician.  Reproductive health. Screening  You may have the following tests or measurements:  Height, weight, and BMI.  Blood pressure.  Lipid and cholesterol levels. These may be checked every 5 years, or more frequently if you are over 63 years old.  Skin check.  Lung cancer screening. You may have this screening every year starting at age 12 if you have a 30-pack-year history of smoking and currently smoke or have quit within the past 15 years.  Fecal occult blood test (FOBT) of the stool. You may have this test every year starting at age 7.  Flexible sigmoidoscopy or colonoscopy. You may have a sigmoidoscopy every 5 years or a colonoscopy every 10 years starting at age 51.  Hepatitis C blood test.  Hepatitis B blood test.  Sexually transmitted disease (STD) testing.  Diabetes screening. This is done by checking your blood sugar (glucose) after you have not eaten for a while (fasting). You may have this done every 1-3 years.  Bone density scan. This is done to screen for osteoporosis. You may have this done starting at age 64.  Mammogram. This may be done every 1-2 years. Talk to your health care provider about how often you should have regular mammograms. Talk with your health care provider about your test results, treatment options, and if necessary,  the need for more tests. Vaccines  Your health care provider may recommend certain vaccines, such as:  Influenza vaccine. This is recommended every year.  Tetanus, diphtheria, and acellular pertussis (Tdap, Td) vaccine. You may need a Td booster every 10 years.  Zoster vaccine. You may need this after age  22.  Pneumococcal 13-valent conjugate (PCV13) vaccine. One dose is recommended after age 57.  Pneumococcal polysaccharide (PPSV23) vaccine. One dose is recommended after age 3. Talk to your health care provider about which screenings and vaccines you need and how often you need them. This information is not intended to replace advice given to you by your health care provider. Make sure you discuss any questions you have with your health care provider. Document Released: 11/12/2015 Document Revised: 07/05/2016 Document Reviewed: 08/17/2015 Elsevier Interactive Patient Education  2017 [place] Prevention in the Home Falls can cause injuries. They can happen to people of all ages. There are many things you can do to make your home safe and to help prevent falls. What can I do on the outside of my home?  Regularly fix the edges of walkways and driveways and fix any cracks.  Remove anything that might make you trip as you walk through a door, such as a raised step or threshold.  Trim any bushes or trees on the path to your home.  Use bright outdoor lighting.  Clear any walking paths of anything that might make someone trip, such as rocks or tools.  Regularly check to see if handrails are loose or broken. Make sure that both sides of any steps have handrails.  Any raised decks and porches should have guardrails on the edges.  Have any leaves, snow, or ice cleared regularly.  Use sand or salt on walking paths during winter.  Clean up any spills in your garage right away. This includes oil or grease spills. What can I do in the bathroom?  Use night lights.  Install grab bars by the toilet and in the tub and shower. Do not use towel bars as grab bars.  Use non-skid mats or decals in the tub or shower.  If you need to sit down in the shower, use a plastic, non-slip stool.  Keep the floor dry. Clean up any water that spills on the floor as soon as it happens.  Remove  soap buildup in the tub or shower regularly.  Attach bath mats securely with double-sided non-slip rug tape.  Do not have throw rugs and other things on the floor that can make you trip. What can I do in the bedroom?  Use night lights.  Make sure that you have a light by your bed that is easy to reach.  Do not use any sheets or blankets that are too big for your bed. They should not hang down onto the floor.  Have a firm chair that has side arms. You can use this for support while you get dressed.  Do not have throw rugs and other things on the floor that can make you trip. What can I do in the kitchen?  Clean up any spills right away.  Avoid walking on wet floors.  Keep items that you use a lot in easy-to-reach places.  If you need to reach something above you, use a strong step stool that has a grab bar.  Keep electrical cords out of the way.  Do not use floor polish or wax that makes floors slippery. If you must use  wax, use non-skid floor wax.  Do not have throw rugs and other things on the floor that can make you trip. What can I do with my stairs?  Do not leave any items on the stairs.  Make sure that there are handrails on both sides of the stairs and use them. Fix handrails that are broken or loose. Make sure that handrails are as long as the stairways.  Check any carpeting to make sure that it is firmly attached to the stairs. Fix any carpet that is loose or worn.  Avoid having throw rugs at the top or bottom of the stairs. If you do have throw rugs, attach them to the floor with carpet tape.  Make sure that you have a light switch at the top of the stairs and the bottom of the stairs. If you do not have them, ask someone to add them for you. What else can I do to help prevent falls?  Wear shoes that:  Do not have high heels.  Have rubber bottoms.  Are comfortable and fit you well.  Are closed at the toe. Do not wear sandals.  If you use a  stepladder:  Make sure that it is fully opened. Do not climb a closed stepladder.  Make sure that both sides of the stepladder are locked into place.  Ask someone to hold it for you, if possible.  Clearly mark and make sure that you can see:  Any grab bars or handrails.  First and last steps.  Where the edge of each step is.  Use tools that help you move around (mobility aids) if they are needed. These include:  Canes.  Walkers.  Scooters.  Crutches.  Turn on the lights when you go into a dark area. Replace any light bulbs as soon as they burn out.  Set up your furniture so you have a clear path. Avoid moving your furniture around.  If any of your floors are uneven, fix them.  If there are any pets around you, be aware of where they are.  Review your medicines with your doctor. Some medicines can make you feel dizzy. This can increase your chance of falling. Ask your doctor what other things that you can do to help prevent falls. This information is not intended to replace advice given to you by your health care provider. Make sure you discuss any questions you have with your health care provider. Document Released: 08/12/2009 Document Revised: 03/23/2016 Document Reviewed: 11/20/2014 Elsevier Interactive Patient Education  2017 [employer].

## 2018-03-29 NOTE — Patient Instructions (Signed)
Depo-medrol injection given today  Continue topical steroid cream to eczema areas  Continue other medications as ordered  Use moisturizing lotion liberally  Wear gloves when working in the garden  Follow up as scheduled or sooner if need be

## 2018-03-29 NOTE — Progress Notes (Signed)
Patient ID: [name], female   DOB: 01/25/1932, 84 y.o.   MRN: [id number]   [facility] OFFICE  Provider: DR Arletha Grippe  Code Status:  Goals of Care:  Advanced Directives 03/29/2018  Does Patient Have a Medical Advance Directive? No  Type of Advance Directive -  Does patient want to make changes to medical advance directive? -  Copy of [place] in Chart? -  Would patient like information on creating a medical advance directive? Yes (MAU/Ambulatory/Procedural Areas - Information given)     Chief Complaint  Patient presents with  . Acute Visit    Patient c/o itching on back and arms     HPI: Patient is a 84 y.o. female seen today for an acute visit for pruritis on extremities and back for unknown duration. She does not recall insect bite. She needs RF on steroid topical cream. She has been gardening and states she was wearing gloves. She is a poor historian due to psych d/o. Hx obtained from nephew, [name].  Past Medical History:  Diagnosis Date  . Anemia, unspecified   . Anxiety   . Arthritis   . Asthma   . Delusions ([place])   . Depression   . Edema 2005   "since put the glass in them"   . GERD (gastroesophageal reflux disease)   . Lumbago   . Lymphedema   . Obstructive chronic bronchitis without exacerbation ([place])   . Other abnormal glucose   . 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 Systolic heart failure (HCC)    EF 35 to 40% per echo January 2015  . Unspecified essential hypertension   . Unspecified nonpsychotic mental disorder   . Unspecified venous (peripheral) insufficiency   . Unspecified venous (peripheral) insufficiency   . Unspecified vitamin D deficiency     Past Surgical History:  Procedure Laterality Date  . APPENDECTOMY    . CERVICAL SPINE SURGERY     x 2  . CHOLECYSTECTOMY    . ESOPHAGOGASTRODUODENOSCOPY N/A 04/05/2015   Procedure: ESOPHAGOGASTRODUODENOSCOPY (EGD);  Surgeon: [name], MD;  Location: [facility];  Service:  Endoscopy;  Laterality: N/A;  . FLEXIBLE SIGMOIDOSCOPY N/A 04/05/2015   Procedure: FLEXIBLE SIGMOIDOSCOPY;  Surgeon: [name], MD;  Location: [facility];  Service: Endoscopy;  Laterality: N/A;  . HAND SURGERY    . LUMBAR DISC SURGERY    . PARTIAL HYSTERECTOMY    . TONSILLECTOMY       reports that she has never smoked. She has never used smokeless tobacco. She reports that she does not drink alcohol or use drugs. Social History   Socioeconomic History  . Marital status: Widowed    Spouse name: Not on file  . Number of children: 0  . Years of education: Not on file  . Highest education level: Not on file  Occupational History  . Occupation: retired  Scientific laboratory technician  . Financial resource strain: Not hard at all  . Food insecurity:    Worry: Never true    Inability: Never true  . Transportation needs:    Medical: No    Non-medical: No  Tobacco Use  . Smoking status: Never Smoker  . Smokeless tobacco: Never Used  Substance and Sexual Activity  . Alcohol use: No  . Drug use: No  . Sexual activity: Not Currently  Lifestyle  . Physical activity:    Days per week: 7 days    Minutes per session: 10 min  . Stress: Not at  all  Relationships  . Social connections:    Talks on phone: More than three times a week    Gets together: More than three times a week    Attends religious service: 1 to 4 times per year    Active member of club or organization: No    Attends meetings of clubs or organizations: Never    Relationship status: Widowed  . Intimate partner violence:    Fear of current or ex partner: No    Emotionally abused: No    Physically abused: No    Forced sexual activity: No  Other Topics Concern  . Not on file  Social History Narrative   Diet? Good, low appetite      Do you drink/eat things with caffeine? no      Marital status?                  widowed                  What year were you married?      Do you live in a house, apartment, assisted living,  condo, trailer, etc.? house      Is it one or more stories? 1 story      How many persons live in your home? Only me      Do you have any pets in your home? (please list) no      Current or past profession: lab tech      Do you exercise?        some                              Type & how often? Foot rolls, some walking      Do you have a living will? yes      Do you have a DNR form?    no                              If not, do you want to discuss one?      Do you have signed POA/HPOA for forms? yes    Family History  Problem Relation Age of Onset  . Diabetes Mother   . Arthritis Father   . Diabetes Paternal Grandfather        entire family on both sides  . Heart disease Maternal Aunt        entire family of both sides  . Cancer Paternal Aunt        type unknown  . Diabetes Sister   . Breast cancer Cousin     Allergies  Allergen Reactions  . Aspirin Other (See Comments)    REACTION: upset stomach  . Codeine Other (See Comments)    REACTION: "MAKES MY BODY GO CRAZY; CRAMPS"  . Latex Nausea And Vomiting    Outpatient Encounter Medications as of 03/29/2018  Medication Sig  . acetaminophen (TYLENOL 8 HOUR) 650 MG CR tablet Take 1 tablet (650 mg total) by mouth every 8 (eight) hours as needed for pain.  [name] albuterol (PROAIR HFA) 108 (90 Base) MCG/ACT inhaler INHALE 2 PUFFS EVERY 4 HOURS AS NEEDED FOR WHEEZING  . ALPRAZolam (XANAX) 0.5 MG tablet Take 1/2 to 1 tablet by mouth three times daily as needed for nerves  . Ascorbic Acid (VITAMIN C) 1000 MG tablet Take 2,000 mg by mouth daily.  [name]  carvedilol (COREG) 6.25 MG tablet TAKE 1 TABLET BY MOUTH TWICE DAILY  . Diphenhyd-Hydrocort-Nystatin (FIRST-DUKES MOUTHWASH) SUSP 1 tsp gargle and swallow three times daily as needed  . doxycycline (VIBRA-TABS) 100 MG tablet Take 1 tablet (100 mg total) by mouth 2 (two) times daily.  . fluticasone-salmeterol (ADVAIR HFA) 115-21 MCG/ACT inhaler USE 2 INHALATIONS TWICE A DAY  . gabapentin  (NEURONTIN) 100 MG capsule TAKE 2 CAPSULES IN THE MORNING AND 2 CAPSULES AT BEDTIME  . hydrALAZINE (APRESOLINE) 25 MG tablet Take 1 tablet (25 mg total) by mouth 2 (two) times daily.  [name] OLANZapine (ZYPREXA) 5 MG tablet Take 1 tablet (5 mg total) by mouth at bedtime.  . ondansetron (ZOFRAN) 4 MG tablet Take 1 tablet (4 mg total) by mouth every 8 (eight) hours as needed for nausea or vomiting.  . potassium chloride SA (K-DUR,KLOR-CON) 20 MEQ tablet Take 2 tablets (40 mEq total) by mouth daily.  . ranitidine (ZANTAC) 150 MG tablet Take 1 tablet (150 mg total) by mouth at bedtime.  . silver sulfADIAZINE (SILVADENE) 1 % cream Apply 1 application topically daily as needed (open wound).  . theophylline (THEO-24) 200 MG 24 hr capsule Take 200 mg every morning by mouth.  . torsemide (DEMADEX) 20 MG tablet Take 3 tablets (60 mg total) by mouth 2 (two) times daily.  . traMADol (ULTRAM) 50 MG tablet Take 1 tablet (50 mg total) by mouth 3 (three) times daily as needed.  . Triamcinolone Acetonide (TRIAMCINOLONE 0.1 % CREAM : EUCERIN) CREA Apply 1 application 2 (two) times daily topically. Apply to the rash on arms and upper part of your lower legs  . Vitamin D, Ergocalciferol, (DRISDOL) 50000 units CAPS capsule Take 50,000 Units every Monday by mouth.   . [DISCONTINUED] clotrimazole-betamethasone (LOTRISONE) cream Apply 1 application topically 2 (two) times daily. To your legs  . Ferrous Sulfate Dried 200 (65 FE) MG TABS Take 1 tablet by mouth 2 (two) times daily. (Patient not taking: Reported on 03/29/2018)  . [EXPIRED] methylPREDNISolone acetate (DEPO-MEDROL) injection 40 mg    No facility-administered encounter medications on file as of 03/29/2018.     Review of Systems:  Review of Systems  Unable to perform ROS: Psychiatric disorder    Health Maintenance  Topic Date Due  . COLONOSCOPY  08/25/2015  . PNA vac Low Risk Adult (2 of 2 - PPSV23) 11/24/2017  . OT PLAN OF CARE  07/29/2018 (Originally  [birth year])  . INFLUENZA VACCINE  05/30/2018  . TETANUS/TDAP  04/06/2025  . DEXA SCAN  Completed    Physical Exam: Vitals:   03/29/18 1111  BP: 136/72  Pulse: 70  Temp: 98.6 F (37 C)  TempSrc: Oral  Weight: 147 lb (66.7 kg)  Height: 4\' 10"  (1.473 m)   Body mass index is 30.72 kg/m. Physical Exam  Constitutional: She appears well-developed and well-nourished.  Cardiovascular:  Chronic LE lymphedema b/l.  Musculoskeletal: She exhibits edema and deformity (finger joints).  Neurological: She is alert.  Skin: Skin is warm, dry and intact. Rash noted. Rash is papular and vesicular.     No web involvement or burrows noted; multiple excoriations on UE and back  Psychiatric: She has a normal mood and affect. Her speech is normal and behavior is normal. Thought content is delusional. Cognition and memory are impaired.    Labs reviewed: Basic Metabolic Panel: Recent Labs    05/12/17 1717 09/04/17 1558 02/12/18 1511  NA 137 138 137  K 4.6 4.2 4.2  CL 103 109  103  CO2 26 23 25   GLUCOSE 74 77 90  BUN 30* 11 17  CREATININE 1.49* 0.99 1.70*  CALCIUM 9.7 9.1 9.7   Liver Function Tests: Recent Labs    04/20/17 1620 05/12/17 1717 09/04/17 1558 02/12/18 1511  AST 14 20 21 19   ALT 9 12* 12* 8  ALKPHOS 65 59 52  --   BILITOT 0.3 0.6 0.4 0.4  PROT 8.4* 8.6* 8.1 8.8*  ALBUMIN 3.6 3.6 3.1*  --    No results for input(s): LIPASE, AMYLASE in the last 8760 hours. No results for input(s): AMMONIA in the last 8760 hours. CBC: Recent Labs    05/12/17 1717 09/04/17 1558 02/12/18 1511  WBC 5.1 5.2 7.6  NEUTROABS  --   --  5,388  HGB 9.2* 8.9* 9.3*  HCT 29.1* 28.9* 28.8*  MCV 84.6 85.0 82.3  PLT 202 253 377   Lipid Panel: No results for input(s): CHOL, HDL, LDLCALC, TRIG, CHOLHDL, LDLDIRECT in the last 8760 hours. Lab Results  Component Value Date   HGBA1C 5.5 04/14/2014    Procedures since last visit: No results found.  Assessment/Plan   ICD-10-CM   1. Poison  ivy dermatitis L23.7 methylPREDNISolone acetate (DEPO-MEDROL) injection 40 mg  2. Eczema, unspecified type L30.9      Depo-medrol 40mg  injection given today  Hold prolia and pneumovax today due to steroid injection - may give next ov if appropriate  Continue other medications as ordered - RF sent to pharmacy for steroid cream  Use moisturizing lotion liberally  Wear gloves when working in the garden  Follow up as scheduled or sooner if need be  [employer]. Perlie Gold  [facility] [address] [place], [place] [address] [phone] Cell (Monday-Friday 8 AM - 5 PM) [phone] After 5 PM and follow prompts

## 2018-03-29 NOTE — Progress Notes (Signed)
Subjective:   [name] is a 84 y.o. female who presents for Medicare Annual (Subsequent) preventive examination.  Last AWV-11/24/2016    Objective:     Vitals: BP 136/72 (BP Location: Left Arm, Patient Position: Sitting)   Pulse 70   Temp 98.6 F (37 C) (Oral)   Ht 4\' 10"  (1.473 m)   Wt 147 lb (66.7 kg)   BMI 30.72 kg/m   Body mass index is 30.72 kg/m.  Advanced Directives 03/29/2018 09/18/2017 09/04/2017 07/31/2017 07/03/2017 05/22/2017 05/16/2017  Does Patient Have a Medical Advance Directive? No No No No No Yes Yes  Type of Advance Directive - - - - - Catering manager  Does patient want to make changes to medical advance directive? - - - - - No - Patient declined -  Copy of [place] in Chart? - - - - - Yes Yes  Would patient like information on creating a medical advance directive? Yes (MAU/Ambulatory/Procedural Areas - Information given) No - Patient declined No - Patient declined - - - -    Tobacco Social History   Tobacco Use  Smoking Status Never Smoker  Smokeless Tobacco Never Used     Counseling given: Not Answered   Clinical Intake:  Pre-visit preparation completed: No  Pain : 0-10 Pain Score: 9  Pain Type: Chronic pain Pain Location: Back Pain Orientation: Lower Pain Descriptors / Indicators: Aching Pain Onset: More than a month ago     Nutritional Risks: None Diabetes: No  How often do you need to have someone help you when you read instructions, pamphlets, or other written materials from your doctor or pharmacy?: 3 - Sometimes What is the last grade level you completed in school?: 1 year college  Interpreter Needed?: No  Information entered by :: Tyson Dense, RN  Past Medical History:  Diagnosis Date  . Anemia, unspecified   . Anxiety   . Arthritis   . Asthma   . Delusions ([place])   . Depression   . Edema 2005   "since put the glass in them"   . GERD (gastroesophageal  reflux disease)   . Lumbago   . Lymphedema   . Obstructive chronic bronchitis without exacerbation ([place])   . Other abnormal glucose   . 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 Systolic heart failure (HCC)    EF 35 to 40% per echo January 2015  . Unspecified essential hypertension   . Unspecified nonpsychotic mental disorder   . Unspecified venous (peripheral) insufficiency   . Unspecified venous (peripheral) insufficiency   . Unspecified vitamin D deficiency    Past Surgical History:  Procedure Laterality Date  . APPENDECTOMY    . CERVICAL SPINE SURGERY     x 2  . CHOLECYSTECTOMY    . ESOPHAGOGASTRODUODENOSCOPY N/A 04/05/2015   Procedure: ESOPHAGOGASTRODUODENOSCOPY (EGD);  Surgeon: [name], MD;  Location: [facility];  Service: Endoscopy;  Laterality: N/A;  . FLEXIBLE SIGMOIDOSCOPY N/A 04/05/2015   Procedure: FLEXIBLE SIGMOIDOSCOPY;  Surgeon: [name], MD;  Location: [facility];  Service: Endoscopy;  Laterality: N/A;  . HAND SURGERY    . LUMBAR DISC SURGERY    . PARTIAL HYSTERECTOMY    . TONSILLECTOMY     Family History  Problem Relation Age of Onset  . Diabetes Mother   . Arthritis Father   . Diabetes Paternal Grandfather        entire family on both sides  . Heart disease  Maternal Aunt        entire family of both sides  . Cancer Paternal Aunt        type unknown  . Diabetes Sister   . Breast cancer Cousin    Social History   Socioeconomic History  . Marital status: Widowed    Spouse name: Not on file  . Number of children: 0  . Years of education: Not on file  . Highest education level: Not on file  Occupational History  . Occupation: retired  Scientific laboratory technician  . Financial resource strain: Not hard at all  . Food insecurity:    Worry: Never true    Inability: Never true  . Transportation needs:    Medical: No    Non-medical: No  Tobacco Use  . Smoking status: Never Smoker  . Smokeless tobacco: Never Used  Substance and Sexual Activity  .  Alcohol use: No  . Drug use: No  . Sexual activity: Not Currently  Lifestyle  . Physical activity:    Days per week: 7 days    Minutes per session: 10 min  . Stress: Not at all  Relationships  . Social connections:    Talks on phone: More than three times a week    Gets together: More than three times a week    Attends religious service: 1 to 4 times per year    Active member of club or organization: No    Attends meetings of clubs or organizations: Never    Relationship status: Widowed  Other Topics Concern  . Not on file  Social History Narrative   Diet? Good, low appetite      Do you drink/eat things with caffeine? no      Marital status?                  widowed                  What year were you married?      Do you live in a house, apartment, assisted living, condo, trailer, etc.? house      Is it one or more stories? 1 story      How many persons live in your home? Only me      Do you have any pets in your home? (please list) no      Current or past profession: lab tech      Do you exercise?        some                              Type & how often? Foot rolls, some walking      Do you have a living will? yes      Do you have a DNR form?    no                              If not, do you want to discuss one?      Do you have signed POA/HPOA for forms? yes    Outpatient Encounter Medications as of 03/29/2018  Medication Sig  . acetaminophen (TYLENOL 8 HOUR) 650 MG CR tablet Take 1 tablet (650 mg total) by mouth every 8 (eight) hours as needed for pain.  [name] albuterol (PROAIR HFA) 108 (90 Base) MCG/ACT inhaler INHALE 2 PUFFS EVERY 4 HOURS AS NEEDED FOR  WHEEZING  . ALPRAZolam (XANAX) 0.5 MG tablet Take 1/2 to 1 tablet by mouth three times daily as needed for nerves  . Ascorbic Acid (VITAMIN C) 1000 MG tablet Take 2,000 mg by mouth daily.  . carvedilol (COREG) 6.25 MG tablet TAKE 1 TABLET BY MOUTH TWICE DAILY  . clotrimazole-betamethasone (LOTRISONE) cream Apply 1  application topically 2 (two) times daily. To your legs  . Diphenhyd-Hydrocort-Nystatin (FIRST-DUKES MOUTHWASH) SUSP 1 tsp gargle and swallow three times daily as needed  . fluticasone-salmeterol (ADVAIR HFA) 115-21 MCG/ACT inhaler USE 2 INHALATIONS TWICE A DAY  . gabapentin (NEURONTIN) 100 MG capsule TAKE 2 CAPSULES IN THE MORNING AND 2 CAPSULES AT BEDTIME  . hydrALAZINE (APRESOLINE) 25 MG tablet Take 1 tablet (25 mg total) by mouth 2 (two) times daily.  [name] OLANZapine (ZYPREXA) 5 MG tablet Take 1 tablet (5 mg total) by mouth at bedtime.  . ondansetron (ZOFRAN) 4 MG tablet Take 1 tablet (4 mg total) by mouth every 8 (eight) hours as needed for nausea or vomiting.  . potassium chloride SA (K-DUR,KLOR-CON) 20 MEQ tablet Take 2 tablets (40 mEq total) by mouth daily.  . ranitidine (ZANTAC) 150 MG tablet Take 1 tablet (150 mg total) by mouth at bedtime.  . silver sulfADIAZINE (SILVADENE) 1 % cream Apply 1 application topically daily as needed (open wound).  . theophylline (THEO-24) 200 MG 24 hr capsule Take 200 mg every morning by mouth.  . torsemide (DEMADEX) 20 MG tablet Take 3 tablets (60 mg total) by mouth 2 (two) times daily.  . traMADol (ULTRAM) 50 MG tablet Take 1 tablet (50 mg total) by mouth 3 (three) times daily as needed.  . Triamcinolone Acetonide (TRIAMCINOLONE 0.1 % CREAM : EUCERIN) CREA Apply 1 application 2 (two) times daily topically. Apply to the rash on arms and upper part of your lower legs  . Vitamin D, Ergocalciferol, (DRISDOL) 50000 units CAPS capsule Take 50,000 Units every Monday by mouth.   . Zoster Vaccine Adjuvanted [facility]) injection Inject 0.5 mLs into the muscle once for 1 dose.  . [DISCONTINUED] clotrimazole-betamethasone (LOTRISONE) cream Apply 1 application topically 2 (two) times daily. To your legs  . [DISCONTINUED] Zoster Vaccine Adjuvanted [facility]) injection Inject 0.5 mLs into the muscle once.  . doxycycline (VIBRA-TABS) 100 MG tablet Take 1 tablet (100 mg  total) by mouth 2 (two) times daily.  . Ferrous Sulfate Dried 200 (65 FE) MG TABS Take 1 tablet by mouth 2 (two) times daily. (Patient not taking: Reported on 03/29/2018)   No facility-administered encounter medications on file as of 03/29/2018.     Activities of Daily Living In your present state of health, do you have any difficulty performing the following activities: 03/29/2018 05/12/2017  Hearing? N N  Vision? N N  Difficulty concentrating or making decisions? Tempie Donning  Walking or climbing stairs? Y Y  Comment - Uses a cane.  Dressing or bathing? N N  Doing errands, shopping? Y -  Conservation officer, nature and eating ? N -  Using the Toilet? N -  In the past six months, have you accidently leaked urine? N -  Do you have problems with loss of bowel control? N -  Managing your Medications? Y -  Managing your Finances? Y -  Housekeeping or managing your Housekeeping? Y -  Some recent data might be hidden    Patient Care Team: Gildardo Cranker, DO as PCP - General (Internal Medicine) [name], MD (Pulmonary Disease) Jacolyn Reedy, MD as Consulting Physician (  Cardiology)    Assessment:   This is a routine wellness examination for [place].  Exercise Activities and Dietary recommendations Current Exercise Habits: Home exercise routine, Type of exercise: stretching, Time (Minutes): 10, Frequency (Times/Week): 7, Weekly Exercise (Minutes/Week): 70, Intensity: Mild, Exercise limited by: None identified  Goals    None      Fall Risk Fall Risk  03/29/2018 02/12/2018 12/05/2017 09/18/2017 07/03/2017  Falls in the past year? No No No No No  Number falls in past yr: - - - - -  Injury with Fall? - - - - -  Comment - - - - -  Risk Factor Category  - - - - -  Risk for fall due to : - - - - -  Risk for fall due to: Comment - - - - -  Follow up - - - - -   Is the patient's home free of loose throw rugs in walkways, pet beds, electrical cords, etc?   yes      Grab bars in the bathroom? yes       Handrails on the stairs?   yes      Adequate lighting?   yes  Depression Screen PHQ 2/9 Scores 03/29/2018 12/18/2016 11/24/2016 10/18/2016  PHQ - 2 Score 0 0 0 0     Cognitive Function MMSE - Mini Mental State Exam 03/29/2018 11/24/2016  Orientation to time 5 5  Orientation to Place 5 4  Registration 3 3  Attention/ Calculation 2 0  Recall 1 3  Language- name 2 objects 2 2  Language- repeat 1 1  Language- follow 3 step command 3 1  Language- read & follow direction 1 1  Write a sentence 0 0  Copy design 1 0  Total score 24 20        Immunization History  Administered Date(s) Administered  . Influenza Split 07/31/2011  . Influenza Whole 09/30/2009, 07/30/2010  . Influenza,inj,Quad PF,6+ Mos 08/13/2015, 07/03/2017  . Influenza-Unspecified 07/30/2016  . Pneumococcal Conjugate-13 11/24/2016  . Tdap 04/07/2015  . Varicella 08/25/2011    Qualifies for Shingles Vaccine? Yes, educated and prescription sent to pharmacy  Screening Tests Health Maintenance  Topic Date Due  . COLONOSCOPY  08/25/2015  . PNA vac Low Risk Adult (2 of 2 - PPSV23) 11/24/2017  . OT PLAN OF CARE  07/29/2018 (Originally May 29, 1932)  . INFLUENZA VACCINE  05/30/2018  . TETANUS/TDAP  04/06/2025  . DEXA SCAN  Completed    Cancer Screenings: Lung: Low Dose CT Chest recommended if Age 46-80 years, 30 pack-year currently smoking OR have quit w/in 15years. Patient does not qualify. Breast:  Up to date on Mammogram? Yes   Up to date of Bone Density/Dexa? Yes Colorectal: up to date  Additional Screenings:  Hepatitis C Screening: declined PNA 23 due, will wait because she is getting a steroid shot today    Plan:    I have personally reviewed and addressed the Medicare Annual Wellness questionnaire and have noted the following in the patient's chart:  A. Medical and social history B. Use of alcohol, tobacco or illicit drugs  C. Current medications and supplements D. Functional ability and status E.    Nutritional status F.  Physical activity G. Advance directives H. List of other physicians I.  Hospitalizations, surgeries, and ER visits in previous 12 months J.  [place] to include hearing, vision, cognitive, depression L. Referrals and appointments - none  In addition, I have reviewed and discussed with patient  certain preventive protocols, quality metrics, and best practice recommendations. A written personalized care plan for preventive services as well as general preventive health recommendations were provided to patient.  See attached scanned questionnaire for additional information.   Signed,   Tyson Dense, RN Nurse Health Advisor  Patient Concerns: Itchiness on bilateral arms and upper back

## 2018-04-09 ENCOUNTER — Ambulatory Visit: Payer: Medicare Other

## 2018-04-16 ENCOUNTER — Ambulatory Visit: Payer: Medicare Other

## 2018-05-14 ENCOUNTER — Ambulatory Visit (INDEPENDENT_AMBULATORY_CARE_PROVIDER_SITE_OTHER): Payer: Medicare Other | Admitting: Internal Medicine

## 2018-05-14 ENCOUNTER — Encounter: Payer: Self-pay | Admitting: Internal Medicine

## 2018-05-14 VITALS — BP 130/80 | HR 77 | Temp 98.5°F | Resp 18 | Ht <= 58 in | Wt 137.2 lb

## 2018-05-14 DIAGNOSIS — S40811A Abrasion of right upper arm, initial encounter: Secondary | ICD-10-CM | POA: Diagnosis not present

## 2018-05-14 DIAGNOSIS — M2550 Pain in unspecified joint: Secondary | ICD-10-CM | POA: Diagnosis not present

## 2018-05-14 DIAGNOSIS — I890 Lymphedema, not elsewhere classified: Secondary | ICD-10-CM | POA: Diagnosis not present

## 2018-05-14 DIAGNOSIS — Z79899 Other long term (current) drug therapy: Secondary | ICD-10-CM

## 2018-05-14 DIAGNOSIS — I5022 Chronic systolic (congestive) heart failure: Secondary | ICD-10-CM

## 2018-05-14 DIAGNOSIS — I10 Essential (primary) hypertension: Secondary | ICD-10-CM

## 2018-05-14 DIAGNOSIS — F22 Delusional disorders: Secondary | ICD-10-CM

## 2018-05-14 DIAGNOSIS — G8929 Other chronic pain: Secondary | ICD-10-CM | POA: Diagnosis not present
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
## 2018-05-14 NOTE — Patient Instructions (Addendum)
Continue current medications as ordered  Will contact mail order pharmacy with refills  Follow up in 3 mos for psychosis, chronic back pain, LE lymphedema, HF

## 2018-05-15 ENCOUNTER — Telehealth: Payer: Self-pay | Admitting: *Deleted

## 2018-05-15 NOTE — Telephone Encounter (Signed)
Received a call from Mr. [place], informed him that [employer] doesn't do the pill pack system. He stated that he would call [place] and get things set up with them and give the office a call back once that is completed.

## 2018-05-15 NOTE — Telephone Encounter (Signed)
LMOM for patient's nephew (guardian) regarding her medications, ask that he return the call to the office.

## 2018-05-17 ENCOUNTER — Encounter: Payer: Self-pay | Admitting: Internal Medicine

## 2018-05-20 NOTE — Telephone Encounter (Signed)
Called nephew to follow up on medications set-up for his aunt, he stated that he let it slip his mind and will go take care of it today after he gets off work.

## 2018-05-21 ENCOUNTER — Other Ambulatory Visit: Payer: Self-pay | Admitting: *Deleted

## 2018-05-21 DIAGNOSIS — R110 Nausea: Secondary | ICD-10-CM

## 2018-05-21 DIAGNOSIS — R1013 Epigastric pain: Secondary | ICD-10-CM

## 2018-05-21 DIAGNOSIS — F22 Delusional disorders: Secondary | ICD-10-CM

## 2018-05-21 DIAGNOSIS — M2550 Pain in unspecified joint: Secondary | ICD-10-CM

## 2018-05-21 DIAGNOSIS — I5022 Chronic systolic (congestive) heart failure: Secondary | ICD-10-CM

## 2018-05-21 DIAGNOSIS — G8929 Other chronic pain: Secondary | ICD-10-CM

## 2018-05-21 MED ORDER — POTASSIUM CHLORIDE CRYS ER 20 MEQ PO TBCR: 40.0000 meq | EXTENDED_RELEASE_TABLET | Freq: Two times a day (BID) | ORAL | 3 refills | Status: DC

## 2018-05-21 MED ORDER — THEOPHYLLINE ER 200 MG PO CP24: 200.0000 mg | ORAL_CAPSULE | ORAL | 2 refills | Status: DC

## 2018-05-21 MED ORDER — ACETAMINOPHEN ER 650 MG PO TBCR: 650.0000 mg | EXTENDED_RELEASE_TABLET | Freq: Three times a day (TID) | ORAL | 0 refills | Status: DC

## 2018-05-21 MED ORDER — TORSEMIDE 20 MG PO TABS: 60.0000 mg | ORAL_TABLET | Freq: Two times a day (BID) | ORAL | 1 refills | Status: DC

## 2018-05-21 MED ORDER — FLUTICASONE-SALMETEROL 115-21 MCG/ACT IN AERO: INHALATION_SPRAY | RESPIRATORY_TRACT | 3 refills | Status: DC

## 2018-05-21 MED ORDER — GABAPENTIN 100 MG PO CAPS: ORAL_CAPSULE | ORAL | 0 refills | Status: DC

## 2018-05-21 MED ORDER — HYDRALAZINE HCL 25 MG PO TABS: 25.0000 mg | ORAL_TABLET | Freq: Two times a day (BID) | ORAL | 0 refills | Status: DC

## 2018-05-21 MED ORDER — RANITIDINE HCL 150 MG PO TABS: 150.0000 mg | ORAL_TABLET | Freq: Every day | ORAL | 2 refills | Status: DC

## 2018-05-21 MED ORDER — VITAMIN C 1000 MG PO TABS: 2000.0000 mg | ORAL_TABLET | Freq: Two times a day (BID) | ORAL | 2 refills | Status: DC

## 2018-05-21 MED ORDER — CALCIUM CARBONATE-VITAMIN D 500-200 MG-UNIT PO TABS: 1.0000 | ORAL_TABLET | Freq: Every day | ORAL | 2 refills | Status: DC

## 2018-05-21 MED ORDER — FERROUS SULFATE DRIED 200 (65 FE) MG PO TABS: 1.0000 | ORAL_TABLET | Freq: Two times a day (BID) | ORAL | 2 refills | Status: DC

## 2018-05-21 MED ORDER — CARVEDILOL 6.25 MG PO TABS: ORAL_TABLET | ORAL | 1 refills | Status: DC

## 2018-05-21 MED ORDER — OLANZAPINE 5 MG PO TABS: 5.0000 mg | ORAL_TABLET | Freq: Every day | ORAL | 1 refills | Status: DC

## 2018-05-31 ENCOUNTER — Telehealth: Payer: Self-pay

## 2018-05-31 DIAGNOSIS — I890 Lymphedema, not elsewhere classified: Secondary | ICD-10-CM

## 2018-05-31 NOTE — Telephone Encounter (Signed)
Per verbal conversation with Dr. Eulas Post, it has been recommended that patient go to the lymphedema clinic in [place].   I left a message for patient's nephew to call the office to discuss this.

## 2018-05-31 NOTE — Telephone Encounter (Signed)
I spoke with [name] and he stated that he would get an appointment scheduled at the lymphedema clinic.

## 2018-05-31 NOTE — Telephone Encounter (Signed)
Patient's niece, Marliss Coots, called to ask for a referral to [facility] at [facility] to have her legs wrapped due to swelling.   Marliss Coots stated that she has been wrapping patient's legs but she is leaving soon and patient will need help to continue having her legs wrapped.   She would like for patient to see Stephani Police.   Phone [phone]  Fax [phone]  Please advise.

## 2018-05-31 NOTE — Telephone Encounter (Signed)
[place] called back to say that [facility] is the lymphedema clinic in [place] and they need a referral to treat patient.   I attempted to call the number provided but the office was closed. I will attempt to call again next week.

## 2018-06-03 NOTE — Telephone Encounter (Signed)
Referral has been placed. Pt nephew is aware.

## 2018-06-03 NOTE — Telephone Encounter (Signed)
Ok to refer to clinic. Thanks

## 2018-06-03 NOTE — Addendum Note (Signed)
Addended by: Denyse Amass on: 06/03/2018 03:29 PM   Modules accepted: Orders

## 2018-06-03 NOTE — Telephone Encounter (Signed)
I called and spoke with [facility] and was told that [name] specialized in lymphedema treatment. The patient would be treated on an outpatient basis at their rehab facility.   A referral is necessary to get patient established with their facility. Is it ok to place order?

## 2018-06-14 ENCOUNTER — Encounter: Payer: Self-pay | Admitting: Internal Medicine

## 2018-06-14 ENCOUNTER — Ambulatory Visit (INDEPENDENT_AMBULATORY_CARE_PROVIDER_SITE_OTHER): Payer: Medicare Other | Admitting: Internal Medicine

## 2018-06-14 VITALS — BP 126/74 | HR 72 | Temp 98.6°F | Ht <= 58 in | Wt 138.0 lb

## 2018-06-14 DIAGNOSIS — L03119 Cellulitis of unspecified part of limb: Secondary | ICD-10-CM | POA: Diagnosis not present

## 2018-06-14 DIAGNOSIS — I890 Lymphedema, not elsewhere classified: Secondary | ICD-10-CM

## 2018-06-14 DIAGNOSIS — L03115 Cellulitis of right lower limb: Secondary | ICD-10-CM | POA: Diagnosis not present

## 2018-06-14 DIAGNOSIS — L03116 Cellulitis of left lower limb: Secondary | ICD-10-CM | POA: Diagnosis not present

## 2018-06-14 DIAGNOSIS — F22 Delusional disorders: Secondary | ICD-10-CM

## 2018-06-14 MED ORDER — CEFTRIAXONE SODIUM 1 G IJ SOLR
1.0000 g | Freq: Once | INTRAMUSCULAR | Status: AC
  Administered 2018-06-14: 1 g via INTRAMUSCULAR

## 2018-06-14 MED ORDER — SACCHAROMYCES BOULARDII 250 MG PO CAPS: 250.0000 mg | ORAL_CAPSULE | Freq: Two times a day (BID) | ORAL | 0 refills | Status: DC

## 2018-06-14 MED ORDER — DOXYCYCLINE HYCLATE 100 MG PO TABS: 100.0000 mg | ORAL_TABLET | Freq: Two times a day (BID) | ORAL | 0 refills | Status: DC

## 2018-06-14 MED ORDER — DOXYCYCLINE HYCLATE 100 MG PO TABS
100.0000 mg | ORAL_TABLET | Freq: Two times a day (BID) | ORAL | 0 refills | Status: DC
Start: 2018-06-14 — End: 2018-06-14

## 2018-06-14 MED ORDER — AMOXICILLIN 500 MG PO CAPS: 500.0000 mg | ORAL_CAPSULE | Freq: Two times a day (BID) | ORAL | 0 refills | Status: DC

## 2018-06-14 NOTE — Progress Notes (Signed)
Patient ID: [name], female   DOB: 07-20-1932, 84 y.o.   MRN: [id number]   [facility] OFFICE  Provider: DR Arletha Grippe  Code Status: Goals of Care:  Advanced Directives 06/14/2018  Does Patient Have a Medical Advance Directive? No  Type of Advance Directive -  Does patient want to make changes to medical advance directive? -  Copy of [place] in Chart? -  Would patient like information on creating a medical advance directive? -     Chief Complaint  Patient presents with  . Acute Visit    Pt is being seen due to leg swelling and weeping. Pt states that she is having pain in her thigh just above her knee on both legs.     HPI: Patient is a 84 y.o. female seen today for an acute visit for weeping RLE. She has b/l LE lymphedema but has not been to lymphedema clinic in some time. She has chill but no fever. Pain in RLE is shoting from lower leg to mid thigh. She has not tried anything OTC. Her son brought her today. She is a poor historian due to psych d/o. Hx obtained from chart and son. She plans to travel to [facility] with her sisters this weekend for a family gathering.  Past Medical History:  Diagnosis Date  . Anemia, unspecified   . Anxiety   . Arthritis   . Asthma   . Delusions ([place])   . Depression   . Edema 2005   "since put the glass in them"   . GERD (gastroesophageal reflux disease)   . Lumbago   . Lymphedema   . Obstructive chronic bronchitis without exacerbation ([place])   . Other abnormal glucose   . 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 Systolic heart failure (HCC)    EF 35 to 40% per echo January 2015  . Unspecified essential hypertension   . Unspecified nonpsychotic mental disorder   . Unspecified venous (peripheral) insufficiency   . Unspecified venous (peripheral) insufficiency   . Unspecified vitamin D deficiency     Past Surgical History:  Procedure Laterality Date  . APPENDECTOMY    . CERVICAL SPINE SURGERY     x 2  .  CHOLECYSTECTOMY    . ESOPHAGOGASTRODUODENOSCOPY N/A 04/05/2015   Procedure: ESOPHAGOGASTRODUODENOSCOPY (EGD);  Surgeon: [name], MD;  Location: [facility];  Service: Endoscopy;  Laterality: N/A;  . FLEXIBLE SIGMOIDOSCOPY N/A 04/05/2015   Procedure: FLEXIBLE SIGMOIDOSCOPY;  Surgeon: [name], MD;  Location: [facility];  Service: Endoscopy;  Laterality: N/A;  . HAND SURGERY    . LUMBAR DISC SURGERY    . PARTIAL HYSTERECTOMY    . TONSILLECTOMY       reports that she has never smoked. She has never used smokeless tobacco. She reports that she does not drink alcohol or use drugs. Social History   Socioeconomic History  . Marital status: Widowed    Spouse name: Not on file  . Number of children: 0  . Years of education: Not on file  . Highest education level: Not on file  Occupational History  . Occupation: retired  Scientific laboratory technician  . Financial resource strain: Not hard at all  . Food insecurity:    Worry: Never true    Inability: Never true  . Transportation needs:    Medical: No    Non-medical: No  Tobacco Use  . Smoking status: Never Smoker  . Smokeless tobacco: Never Used  Substance and Sexual  Activity  . Alcohol use: No  . Drug use: No  . Sexual activity: Not Currently  Lifestyle  . Physical activity:    Days per week: 7 days    Minutes per session: 10 min  . Stress: Not at all  Relationships  . Social connections:    Talks on phone: More than three times a week    Gets together: More than three times a week    Attends religious service: 1 to 4 times per year    Active member of club or organization: No    Attends meetings of clubs or organizations: Never    Relationship status: Widowed  . Intimate partner violence:    Fear of current or ex partner: No    Emotionally abused: No    Physically abused: No    Forced sexual activity: No  Other Topics Concern  . Not on file  Social History Narrative   Diet? Good, low appetite      Do you drink/eat  things with caffeine? no      Marital status?                  widowed                  What year were you married?      Do you live in a house, apartment, assisted living, condo, trailer, etc.? house      Is it one or more stories? 1 story      How many persons live in your home? Only me      Do you have any pets in your home? (please list) no      Current or past profession: lab tech      Do you exercise?        some                              Type & how often? Foot rolls, some walking      Do you have a living will? yes      Do you have a DNR form?    no                              If not, do you want to discuss one?      Do you have signed POA/HPOA for forms? yes    Family History  Problem Relation Age of Onset  . Diabetes Mother   . Arthritis Father   . Diabetes Paternal Grandfather        entire family on both sides  . Heart disease Maternal Aunt        entire family of both sides  . Cancer Paternal Aunt        type unknown  . Diabetes Sister   . Breast cancer Cousin     Allergies  Allergen Reactions  . Aspirin Other (See Comments)    REACTION: upset stomach  . Codeine Other (See Comments)    REACTION: "MAKES MY BODY GO CRAZY; CRAMPS"  . Latex Nausea And Vomiting    Outpatient Encounter Medications as of 06/14/2018  Medication Sig  . acetaminophen (TYLENOL 8 HOUR) 650 MG CR tablet Take 1 tablet (650 mg total) by mouth 3 (three) times daily with meals.  [name] albuterol (PROAIR HFA) 108 (90 Base) MCG/ACT inhaler INHALE 2 PUFFS EVERY 4  HOURS AS NEEDED FOR WHEEZING  . ALPRAZolam (XANAX) 0.5 MG tablet Take 1/2 to 1 tablet by mouth three times daily as needed for nerves  . Ascorbic Acid (VITAMIN C) 1000 MG tablet Take 2 tablets (2,000 mg total) by mouth 2 (two) times daily before lunch and supper.  . calcium-vitamin D (OSCAL WITH D) 500-200 MG-UNIT tablet Take 1 tablet by mouth daily with breakfast.  . carvedilol (COREG) 6.25 MG tablet Take 1 tablet twice daily in  lunch and supper.  . clotrimazole-betamethasone (LOTRISONE) cream Apply 1 application topically 2 (two) times daily. To your legs  . Diphenhyd-Hydrocort-Nystatin (FIRST-DUKES MOUTHWASH) SUSP 1 tsp gargle and swallow three times daily as needed  . Ferrous Sulfate Dried 200 (65 Fe) MG TABS Take 1 tablet by mouth 2 (two) times daily before lunch and supper.  . fluticasone-salmeterol (ADVAIR HFA) 115-21 MCG/ACT inhaler USE 2 INHALATIONS TWICE A DAY  . gabapentin (NEURONTIN) 100 MG capsule TAKE 2 CAPSULES IN THE MORNING AND 2 CAPSULES AT BEDTIME  . hydrALAZINE (APRESOLINE) 25 MG tablet Take 1 tablet (25 mg total) by mouth 2 (two) times daily at 8 am and 10 pm.  . OLANZapine (ZYPREXA) 5 MG tablet Take 1 tablet (5 mg total) by mouth at bedtime.  . potassium chloride SA (K-DUR,KLOR-CON) 20 MEQ tablet Take 2 tablets (40 mEq total) by mouth 2 (two) times daily before lunch and supper.  . ranitidine (ZANTAC) 150 MG tablet Take 1 tablet (150 mg total) by mouth at bedtime.  . silver sulfADIAZINE (SILVADENE) 1 % cream Apply 1 application topically daily as needed (open wound).  . theophylline (THEO-24) 200 MG 24 hr capsule Take 1 capsule (200 mg total) by mouth every morning.  . torsemide (DEMADEX) 20 MG tablet Take 3 tablets (60 mg total) by mouth 2 (two) times daily at 8 am and 10 pm.  . traMADol (ULTRAM) 50 MG tablet Take 1 tablet (50 mg total) by mouth 3 (three) times daily as needed.  . Triamcinolone Acetonide (TRIAMCINOLONE 0.1 % CREAM : EUCERIN) CREA Apply 1 application 2 (two) times daily topically. Apply to the rash on arms and upper part of your lower legs  . Vitamin D, Ergocalciferol, (DRISDOL) 50000 units CAPS capsule Take 50,000 Units every Monday by mouth.    No facility-administered encounter medications on file as of 06/14/2018.     Review of Systems:  Review of Systems  Health Maintenance  Topic Date Due  . COLONOSCOPY  08/25/2015  . PNA vac Low Risk Adult (2 of 2 - PPSV23) 11/24/2017  .  INFLUENZA VACCINE  05/30/2018  . OT PLAN OF CARE  07/29/2018 (Originally February 12, 1932)  . TETANUS/TDAP  04/06/2025  . DEXA SCAN  Completed    Physical Exam: Vitals:   06/14/18 1109  BP: 126/74  Pulse: 72  Temp: 98.6 F (37 C)  TempSrc: Oral  SpO2: 96%  Weight: 138 lb (62.6 kg)  Height: _0  (1.473 m)   Body mass index is 28.84 kg/m. Physical Exam  Constitutional: She appears well-developed and well-nourished.  Cardiovascular:  Pulses:      Dorsalis pedis pulses are 2+ on the right side, and 2+ on the left side.       Posterior tibial pulses are 2+ on the right side, and 2+ on the left side.  R>L +3 pitting LE edema with chronic venous stasis changes; right leg actively weeping. (+) lymphedema b/l LE. (+) right calf TTP  Musculoskeletal: She exhibits edema and tenderness.  Neurological: She is  alert.  Skin: Rash noted. There is erythema.  R>LLE increased redness/streaking with warmth to touch and TTP; (+) clear d/c weeping from RLE; skin appears macerated R>LLE; no pus  Psychiatric: She has a normal mood and affect. Her behavior is normal.    Labs reviewed: Basic Metabolic Panel: Recent Labs    09/04/17 1558 02/12/18 1511  NA 138 137  K 4.2 4.2  CL 109 103  CO2 23 25  GLUCOSE 77 90  BUN 11 17  CREATININE 0.99 1.70*  CALCIUM 9.1 9.7   Liver Function Tests: Recent Labs    09/04/17 1558 02/12/18 1511  AST 21 19  ALT 12* 8  ALKPHOS 52  --   BILITOT 0.4 0.4  PROT 8.1 8.8*  ALBUMIN 3.1*  --    No results for input(s): LIPASE, AMYLASE in the last 8760 hours. No results for input(s): AMMONIA in the last 8760 hours. CBC: Recent Labs    09/04/17 1558 02/12/18 1511  WBC 5.2 7.6  NEUTROABS  --  5,388  HGB 8.9* 9.3*  HCT 28.9* 28.8*  MCV 85.0 82.3  PLT 253 377   Lipid Panel: No results for input(s): CHOL, HDL, LDLCALC, TRIG, CHOLHDL, LDLDIRECT in the last 8760 hours. Lab Results  Component Value Date   HGBA1C 5.5 04/14/2014    Procedures since last  visit: No results found.  Assessment/Plan   ICD-10-CM   1. Cellulitis of lower extremity, unspecified laterality L03.119 amoxicillin (AMOXIL) 500 MG capsule    saccharomyces boulardii (FLORASTOR) 250 MG capsule    CBC with Differential/Platelets    BMP with eGFR([facility])    cefTRIAXone (ROCEPHIN) injection 1 g    doxycycline (VIBRA-TABS) 100 MG tablet    Ambulatory referral to Home Health    DISCONTINUED: doxycycline (VIBRA-TABS) 100 MG tablet   R>L  2. Lymphedema of both lower extremities I89.0 CBC with Differential/Platelets    BMP with eGFR([facility])    Ambulatory referral to Home Health  3. Paranoid delusion ([place]) [place] Ambulatory referral to [place] not recommend traveling at this time  Will call with lab results  Will call with home health referral for wound care.  Follow up with lymphedema clinic as scheduled  START DOXYCYCLINE 100MG 2 TIMES DAILY X 2 WEEKS   START AMOXICILLIN  500MG 2 TIMES DAILY X 2 WEEKS  TAKE PROBIOTIC (FLORASTER 250MG 2 TIMES DAILY X 3 WEEKS) WHILE ON ANTIBIOTICS  IF NO IMPROVEMENT IN SWELLING/PAIN/WEEPING IN NEXT 2-3 DAYS, GO TO THE ER FOR FURTHER EVALUATION  Follow up in 2 weeks for cellulitis, lymphedema   Nahshon Reich S. Perlie Gold  [facility] [address] [place], [place] [address] [phone] Cell (Monday-Friday 8 AM - 5 PM) [phone] After 5 PM and follow prompts

## 2018-06-14 NOTE — Patient Instructions (Addendum)
Will call with lab results  Will call with home health referral for wound care.  Follow up with lymphedema clinic as scheduled  START DOXYCYCLINE 100MG  2 TIMES DAILY X 2 WEEKS   START AMOXICILLIN  500MG  2 TIMES DAILY X 2 WEEKS  TAKE PROBIOTIC (FLORASTER 250MG  2 TIMES DAILY X 3 WEEKS) WHILE ON ANTIBIOTICS  IF NO IMPROVEMENT IN SWELLING/PAIN/WEEPING IN NEXT 2-3 DAYS, GO TO THE ER FOR FURTHER EVALUATION  Follow up in 2 weeks for cellulitis, lymphedema  Erysipelas Erysipelas is an infection that affects the skin and the tissues that are near the surface of the skin. It causes the skin to become red, swollen, and painful. The infection is most common on the legs but may also affect other areas, such as the face. With treatment, the infection usually goes away in a few days. If not treated, the infection can spread or lead to other problems, such as abscesses. What are the causes? Erysipelas is caused by bacteria. Most often, it is caused by bacteria called streptococci. The bacteria often enter through a break in the skin, such as a cut, surgical incision, burn, insect bite, open sore, or crack in the skin. Sometimes the source where the bacteria entered is not known. What increases the risk? Some people are at an increased risk for developing erysipelas, including:  Young children.  Elderly people.  People with a weakened body defense system (immune system), such as people with HIV or AIDS.  People who have diabetes.  People who drink too much alcohol.  People who have had recent surgery.  People with yeast infections of the skin.  People who have swollen legs.  What are the signs or symptoms? The infection causes a reddened area on the skin. This reddened area may:  Be painful and swollen.  Have a distinct border around it.  Feel itchy and hot.  Develop blisters.  Other symptoms may include:  Fever.  Chills.  Nausea and vomiting.  Swollen glands (lymph  nodes).  Headache.  Fatigue.  Loss of appetite.  How is this diagnosed? Your health care provider will take your medical history and do a physical exam. He or she will usually be able to diagnose erysipelas by closely examining your skin. How is this treated? Erysipelas can usually be treated effectively with antibiotic medicines. The infection usually gets better within a few days of treatment. Follow these instructions at home:  Take medicines only as directed by your health care provider.  Take your antibiotic medicine as directed by your health care provider. Finish the antibiotic even if you start to feel better.  If the skin infection is on your leg or arm, elevate the leg or arm to help reduce swelling.  Do not put creams or lotions on the affected area of your skin unless your health care provider instructs you to do that.  Do not share bedding, towels, or washcloths (linens) with other people. Using only your own linens will help to prevent the infection from spreading to others.  Keep all follow-up visits as directed by your health care provider. This is important. Contact a health care provider if:  You have pain or discomfort that is not controlled by medicines.  Your red area of skin gets larger or turns dark in color.  Your skin infection returns in the same area or appears in another area. Get help right away if:  Your fever is getting worse.  Your feelings of illness are getting worse.  You  notice red streaks coming from the infected area. This information is not intended to replace advice given to you by your health care provider. Make sure you discuss any questions you have with your health care provider. Document Released: 07/11/2001 Document Revised: 03/23/2016 Document Reviewed: 06/01/2014 Elsevier Interactive Patient Education  2018 [employer].

## 2018-06-15 LAB — BASIC METABOLIC PANEL WITH GFR
BUN / CREAT RATIO: 18 (calc) (ref 6–22)
BUN: 26 mg/dL — ABNORMAL HIGH (ref 7–25)
CALCIUM: 10.1 mg/dL (ref 8.6–10.4)
CO2: 30 mmol/L (ref 20–32)
Chloride: 99 mmol/L (ref 98–110)
Creat: 1.46 mg/dL — ABNORMAL HIGH (ref 0.60–0.88)
GFR, EST AFRICAN AMERICAN: 38 mL/min/{1.73_m2} — AB (ref >=60)
GFR, EST NON AFRICAN AMERICAN: 33 mL/min/{1.73_m2} — AB (ref >=60)
Glucose, Bld: 118 mg/dL (ref 65–139)
Potassium: 3.6 mmol/L (ref 3.5–5.3)
Sodium: 139 mmol/L (ref 135–146)

## 2018-06-15 LAB — CBC WITH DIFFERENTIAL/PLATELET
Basophils Absolute: 7 cells/uL (ref 0–200)
Basophils Relative: 0.1 %
EOS ABS: 41 {cells}/uL (ref 15–500)
EOS PCT: 0.6 %
HEMATOCRIT: 33.3 % — AB (ref 35.0–45.0)
Hemoglobin: 10.4 g/dL — ABNORMAL LOW (ref 11.7–15.5)
Lymphs Abs: 904 cells/uL (ref 850–3900)
MCH: 26.9 pg — ABNORMAL LOW (ref 27.0–33.0)
MCHC: 31.2 g/dL — AB (ref 32.0–36.0)
MCV: 86.3 fL (ref 80.0–100.0)
MONOS PCT: 8.2 %
MPV: 9 fL (ref 7.5–12.5)
Neutro Abs: 5382 cells/uL (ref 1500–7800)
Neutrophils Relative %: 78 %
Platelets: 247 10*3/uL (ref 140–400)
RBC: 3.86 10*6/uL (ref 3.80–5.10)
RDW: 14.2 % (ref 11.0–15.0)
TOTAL LYMPHOCYTE: 13.1 %
WBC: 6.9 10*3/uL (ref 3.8–10.8)
WBCMIX: 566 {cells}/uL (ref 200–950)

## 2018-06-19 ENCOUNTER — Encounter: Payer: Self-pay | Admitting: Internal Medicine

## 2018-06-20 ENCOUNTER — Telehealth: Payer: Self-pay | Admitting: *Deleted

## 2018-06-20 NOTE — Telephone Encounter (Signed)
[facility] calling asking for verbal order to begin start of care for patient tomorrow 06/21/18. Verbal order given

## 2018-06-21 DIAGNOSIS — I5022 Chronic systolic (congestive) heart failure: Secondary | ICD-10-CM

## 2018-06-21 DIAGNOSIS — N183 Chronic kidney disease, stage 3 (moderate): Secondary | ICD-10-CM | POA: Diagnosis not present

## 2018-06-21 DIAGNOSIS — L03116 Cellulitis of left lower limb: Secondary | ICD-10-CM | POA: Diagnosis not present

## 2018-06-21 DIAGNOSIS — F419 Anxiety disorder, unspecified: Secondary | ICD-10-CM | POA: Diagnosis not present

## 2018-06-21 DIAGNOSIS — I890 Lymphedema, not elsewhere classified: Secondary | ICD-10-CM | POA: Diagnosis not present

## 2018-06-21 DIAGNOSIS — I130 Hypertensive heart and chronic kidney disease with heart failure and stage 1 through stage 4 chronic kidney disease, or unspecified chronic kidney disease: Secondary | ICD-10-CM

## 2018-06-21 DIAGNOSIS — I739 Peripheral vascular disease, unspecified: Secondary | ICD-10-CM | POA: Diagnosis not present

## 2018-06-21 DIAGNOSIS — F329 Major depressive disorder, single episode, unspecified: Secondary | ICD-10-CM | POA: Diagnosis not present

## 2018-06-21 DIAGNOSIS — Z9181 History of falling: Secondary | ICD-10-CM | POA: Diagnosis not present

## 2018-06-21 DIAGNOSIS — J449 Chronic obstructive pulmonary disease, unspecified: Secondary | ICD-10-CM | POA: Diagnosis not present

## 2018-06-21 DIAGNOSIS — E559 Vitamin D deficiency, unspecified: Secondary | ICD-10-CM | POA: Diagnosis not present

## 2018-06-21 DIAGNOSIS — D631 Anemia in chronic kidney disease: Secondary | ICD-10-CM | POA: Diagnosis not present

## 2018-06-21 DIAGNOSIS — F22 Delusional disorders: Secondary | ICD-10-CM | POA: Diagnosis not present

## 2018-06-21 DIAGNOSIS — I872 Venous insufficiency (chronic) (peripheral): Secondary | ICD-10-CM | POA: Diagnosis not present

## 2018-06-21 DIAGNOSIS — M1991 Primary osteoarthritis, unspecified site: Secondary | ICD-10-CM | POA: Diagnosis not present

## 2018-06-21 DIAGNOSIS — G8929 Other chronic pain: Secondary | ICD-10-CM | POA: Diagnosis not present

## 2018-06-21 DIAGNOSIS — M810 Age-related osteoporosis without current pathological fracture: Secondary | ICD-10-CM | POA: Diagnosis not present

## 2018-06-21 DIAGNOSIS — Z79891 Long term (current) use of opiate analgesic: Secondary | ICD-10-CM | POA: Diagnosis not present

## 2018-06-21 DIAGNOSIS — L03115 Cellulitis of right lower limb: Secondary | ICD-10-CM | POA: Diagnosis not present

## 2018-06-24 ENCOUNTER — Telehealth: Payer: Self-pay | Admitting: *Deleted

## 2018-06-24 NOTE — Telephone Encounter (Signed)
April notified and agreed.  

## 2018-06-24 NOTE — Telephone Encounter (Signed)
Ok to request 

## 2018-06-24 NOTE — Telephone Encounter (Signed)
[name] with [facility] called and requested verbal orders to place [facility] to bilateral lower extremities twice a week and 2 prn. Is this ok to give verbal order. Please Advise.

## 2018-06-25 DIAGNOSIS — L03116 Cellulitis of left lower limb: Secondary | ICD-10-CM | POA: Diagnosis not present

## 2018-06-25 DIAGNOSIS — I890 Lymphedema, not elsewhere classified: Secondary | ICD-10-CM | POA: Diagnosis not present

## 2018-06-25 DIAGNOSIS — I872 Venous insufficiency (chronic) (peripheral): Secondary | ICD-10-CM | POA: Diagnosis not present

## 2018-06-25 DIAGNOSIS — L03115 Cellulitis of right lower limb: Secondary | ICD-10-CM | POA: Diagnosis not present

## 2018-06-25 DIAGNOSIS — F22 Delusional disorders: Secondary | ICD-10-CM | POA: Diagnosis not present

## 2018-06-25 DIAGNOSIS — I130 Hypertensive heart and chronic kidney disease with heart failure and stage 1 through stage 4 chronic kidney disease, or unspecified chronic kidney disease: Secondary | ICD-10-CM | POA: Diagnosis not present

## 2018-06-28 ENCOUNTER — Ambulatory Visit (INDEPENDENT_AMBULATORY_CARE_PROVIDER_SITE_OTHER): Payer: Medicare Other | Admitting: Internal Medicine

## 2018-06-28 ENCOUNTER — Encounter: Payer: Self-pay | Admitting: Internal Medicine

## 2018-06-28 VITALS — BP 136/80 | HR 63 | Temp 98.1°F | Ht <= 58 in | Wt 142.0 lb

## 2018-06-28 DIAGNOSIS — I890 Lymphedema, not elsewhere classified: Secondary | ICD-10-CM | POA: Diagnosis not present

## 2018-06-28 DIAGNOSIS — L03119 Cellulitis of unspecified part of limb: Secondary | ICD-10-CM | POA: Diagnosis not present

## 2018-06-28 DIAGNOSIS — F22 Delusional disorders: Secondary | ICD-10-CM | POA: Diagnosis not present

## 2018-06-28 NOTE — Progress Notes (Signed)
Patient ID: [name], female   DOB: July 26, 1932, 84 y.o.   MRN: [id number]   Location:  [facility] OFFICE  Provider: DR Arletha Grippe  Code Status:  Goals of Care:  Advanced Directives 06/14/2018  Does Patient Have a Medical Advance Directive? No  Type of Advance Directive -  Does patient want to make changes to medical advance directive? -  Copy of [place] in Chart? -  Would patient like information on creating a medical advance directive? -     Chief Complaint  Patient presents with  . Medical Management of Chronic Issues    wound recheck    HPI: Patient is a 84 y.o. female seen today for f/u b/l LE cellulitis with hx chronic lymphedema. She has almost completed 14 days of doxy and amoxicillin. Wound care following b/l LE lymphedema and keeping legs wrapped. Swelling improved and redness resolved. No pain. Her sister is present. She is a poor historian due to psych d/o. Hx obtained from chart. HH wrapping her legs with Unna boots  She was seen  2 weeks ago for "weeping RLE. She has b/l LE lymphedema but has not been to lymphedema clinic in some time. She has chill but no fever. Pain in RLE is shoting from lower leg to mid thigh. She has not tried anything OTC. Her son brought her today. She is a poor historian due to psych d/o. Hx obtained from chart and son. She plans to travel to [facility] with her sisters this weekend for a family gathering."     Past Medical History:  Diagnosis Date  . Anemia, unspecified   . Anxiety   . Arthritis   . Asthma   . Delusions ([place])   . Depression   . Edema 2005   "since put the glass in them"   . GERD (gastroesophageal reflux disease)   . Lumbago   . Lymphedema   . Obstructive chronic bronchitis without exacerbation ([place])   . Other abnormal glucose   . 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 Systolic heart failure ([facility])    EF 35 to 40% per echo January 2015  . Unspecified essential hypertension   . Unspecified  nonpsychotic mental disorder   . Unspecified venous (peripheral) insufficiency   . Unspecified venous (peripheral) insufficiency   . Unspecified vitamin D deficiency     Past Surgical History:  Procedure Laterality Date  . APPENDECTOMY    . CERVICAL SPINE SURGERY     x 2  . CHOLECYSTECTOMY    . ESOPHAGOGASTRODUODENOSCOPY N/A 04/05/2015   Procedure: ESOPHAGOGASTRODUODENOSCOPY (EGD);  Surgeon: [name], MD;  Location: [facility];  Service: Endoscopy;  Laterality: N/A;  . FLEXIBLE SIGMOIDOSCOPY N/A 04/05/2015   Procedure: FLEXIBLE SIGMOIDOSCOPY;  Surgeon: [name], MD;  Location: [facility];  Service: Endoscopy;  Laterality: N/A;  . HAND SURGERY    . LUMBAR DISC SURGERY    . PARTIAL HYSTERECTOMY    . TONSILLECTOMY       reports that she has never smoked. She has never used smokeless tobacco. She reports that she does not drink alcohol or use drugs. Social History   Socioeconomic History  . Marital status: Widowed    Spouse name: Not on file  . Number of children: 0  . Years of education: Not on file  . Highest education level: Not on file  Occupational History  . Occupation: retired  Scientific laboratory technician  . Financial resource strain: Not hard at all  .  Food insecurity:    Worry: Never true    Inability: Never true  . Transportation needs:    Medical: No    Non-medical: No  Tobacco Use  . Smoking status: Never Smoker  . Smokeless tobacco: Never Used  Substance and Sexual Activity  . Alcohol use: No  . Drug use: No  . Sexual activity: Not Currently  Lifestyle  . Physical activity:    Days per week: 7 days    Minutes per session: 10 min  . Stress: Not at all  Relationships  . Social connections:    Talks on phone: More than three times a week    Gets together: More than three times a week    Attends religious service: 1 to 4 times per year    Active member of club or organization: No    Attends meetings of clubs or organizations: Never    Relationship  status: Widowed  . Intimate partner violence:    Fear of current or ex partner: No    Emotionally abused: No    Physically abused: No    Forced sexual activity: No  Other Topics Concern  . Not on file  Social History Narrative   Diet? Good, low appetite      Do you drink/eat things with caffeine? no      Marital status?                  widowed                  What year were you married?      Do you live in a house, apartment, assisted living, condo, trailer, etc.? house      Is it one or more stories? 1 story      How many persons live in your home? Only me      Do you have any pets in your home? (please list) no      Current or past profession: lab tech      Do you exercise?        some                              Type & how often? Foot rolls, some walking      Do you have a living will? yes      Do you have a DNR form?    no                              If not, do you want to discuss one?      Do you have signed POA/HPOA for forms? yes    Family History  Problem Relation Age of Onset  . Diabetes Mother   . Arthritis Father   . Diabetes Paternal Grandfather        entire family on both sides  . Heart disease Maternal Aunt        entire family of both sides  . Cancer Paternal Aunt        type unknown  . Diabetes Sister   . Breast cancer Cousin     Allergies  Allergen Reactions  . Aspirin Other (See Comments)    REACTION: upset stomach  . Codeine Other (See Comments)    REACTION: "MAKES MY BODY GO CRAZY; CRAMPS"  . Latex Nausea And Vomiting    Outpatient  Encounter Medications as of 06/28/2018  Medication Sig  . acetaminophen (TYLENOL 8 HOUR) 650 MG CR tablet Take 1 tablet (650 mg total) by mouth 3 (three) times daily with meals.  [name] albuterol (PROAIR HFA) 108 (90 Base) MCG/ACT inhaler INHALE 2 PUFFS EVERY 4 HOURS AS NEEDED FOR WHEEZING  . ALPRAZolam (XANAX) 0.5 MG tablet Take 1/2 to 1 tablet by mouth three times daily as needed for nerves  . amoxicillin  (AMOXIL) 500 MG capsule Take 1 capsule (500 mg total) by mouth 2 (two) times daily. FOR SKIN INFECTION; START ON SATURDAY 8/17TH  . Ascorbic Acid (VITAMIN C) 1000 MG tablet Take 2 tablets (2,000 mg total) by mouth 2 (two) times daily before lunch and supper.  . calcium-vitamin D (OSCAL WITH D) 500-200 MG-UNIT tablet Take 1 tablet by mouth daily with breakfast.  . carvedilol (COREG) 6.25 MG tablet Take 1 tablet twice daily in lunch and supper.  . clotrimazole-betamethasone (LOTRISONE) cream Apply 1 application topically 2 (two) times daily. To your legs  . doxycycline (VIBRA-TABS) 100 MG tablet Take 1 tablet (100 mg total) by mouth 2 (two) times daily. FOR SKIN INFECTION START ON SATURDAY 8/17TH  . Ferrous Sulfate Dried 200 (65 Fe) MG TABS Take 1 tablet by mouth 2 (two) times daily before lunch and supper.  . fluticasone-salmeterol (ADVAIR HFA) 115-21 MCG/ACT inhaler USE 2 INHALATIONS TWICE A DAY  . gabapentin (NEURONTIN) 100 MG capsule TAKE 2 CAPSULES IN THE MORNING AND 2 CAPSULES AT BEDTIME  . hydrALAZINE (APRESOLINE) 25 MG tablet Take 1 tablet (25 mg total) by mouth 2 (two) times daily at 8 am and 10 pm.  . OLANZapine (ZYPREXA) 5 MG tablet Take 1 tablet (5 mg total) by mouth at bedtime.  . potassium chloride SA (K-DUR,KLOR-CON) 20 MEQ tablet Take 2 tablets (40 mEq total) by mouth 2 (two) times daily before lunch and supper.  . ranitidine (ZANTAC) 150 MG tablet Take 1 tablet (150 mg total) by mouth at bedtime.  . saccharomyces boulardii (FLORASTOR) 250 MG capsule Take 1 capsule (250 mg total) by mouth 2 (two) times daily. TO KEEP COLON HEALTHY  . silver sulfADIAZINE (SILVADENE) 1 % cream Apply 1 application topically daily as needed (open wound).  . theophylline (THEO-24) 200 MG 24 hr capsule Take 1 capsule (200 mg total) by mouth every morning.  . torsemide (DEMADEX) 20 MG tablet Take 3 tablets (60 mg total) by mouth 2 (two) times daily at 8 am and 10 pm.  . traMADol (ULTRAM) 50 MG tablet Take 1  tablet (50 mg total) by mouth 3 (three) times daily as needed.  . Triamcinolone Acetonide (TRIAMCINOLONE 0.1 % CREAM : EUCERIN) CREA Apply 1 application 2 (two) times daily topically. Apply to the rash on arms and upper part of your lower legs  . Vitamin D, Ergocalciferol, (DRISDOL) 50000 units CAPS capsule Take 50,000 Units every Monday by mouth.   . [DISCONTINUED] Diphenhyd-Hydrocort-Nystatin (FIRST-DUKES MOUTHWASH) SUSP 1 tsp gargle and swallow three times daily as needed   No facility-administered encounter medications on file as of 06/28/2018.     Review of Systems:  Review of Systems  Unable to perform ROS: Psychiatric disorder    Health Maintenance  Topic Date Due  . COLONOSCOPY  08/25/2015  . PNA vac Low Risk Adult (2 of 2 - PPSV23) 11/24/2017  . INFLUENZA VACCINE  05/30/2018  . OT PLAN OF CARE  07/29/2018 (Originally [birth year])  . TETANUS/TDAP  04/06/2025  . DEXA SCAN  Completed  Physical Exam: Vitals:   06/28/18 0842  BP: 136/80  Pulse: 63  Temp: 98.1 F (36.7 C)  TempSrc: Oral  SpO2: 98%  Weight: 142 lb (64.4 kg)  Height: 4\' 10"  (1.473 m)   Body mass index is 29.68 kg/m. Physical Exam  Constitutional: She appears well-developed and well-nourished.  Cardiovascular:  Pulses:      Dorsalis pedis pulses are 2+ on the right side, and 2+ on the left side.       Posterior tibial pulses are 2+ on the right side, and 2+ on the left side.  +2 pitting L>RLE edema; no calf TTP; chronic venous stasis changes; no redness or streaking. Unna boots off.  Neurological: She is alert.  Skin: Skin is warm and dry. No rash noted.  No weeping; skin is very dry and flaky  Psychiatric: She has a normal mood and affect. Her behavior is normal. Thought content is delusional.    Labs reviewed: Basic Metabolic Panel: Recent Labs    09/04/17 1558 02/12/18 1511 06/14/18 1210  NA 138 137 139  K 4.2 4.2 3.6  CL 109 103 99  CO2 23 25 30   GLUCOSE 77 90 118  BUN 11 17 26*    CREATININE 0.99 1.70* 1.46*  CALCIUM 9.1 9.7 10.1   Liver Function Tests: Recent Labs    09/04/17 1558 02/12/18 1511  AST 21 19  ALT 12* 8  ALKPHOS 52  --   BILITOT 0.4 0.4  PROT 8.1 8.8*  ALBUMIN 3.1*  --    No results for input(s): LIPASE, AMYLASE in the last 8760 hours. No results for input(s): AMMONIA in the last 8760 hours. CBC: Recent Labs    09/04/17 1558 02/12/18 1511 06/14/18 1210  WBC 5.2 7.6 6.9  NEUTROABS  --  5,388 5,382  HGB 8.9* 9.3* 10.4*  HCT 28.9* 28.8* 33.3*  MCV 85.0 82.3 86.3  PLT 253 377 247   Lipid Panel: No results for input(s): CHOL, HDL, LDLCALC, TRIG, CHOLHDL, LDLDIRECT in the last 8760 hours. Lab Results  Component Value Date   HGBA1C 5.5 04/14/2014    Procedures since last visit: No results found.  Assessment/Plan   ICD-10-CM   1. Lymphedema of both lower extremities I89.0   2. Cellulitis of lower extremity, unspecified laterality L03.119    resolved  3. 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 Perlie Gold  [facility] [address] [place], [place] [address] [phone] Cell (Monday-Friday 8 AM - 5 PM) [phone] After 5 PM and follow prompts

## 2018-06-28 NOTE — Patient Instructions (Signed)
FINISH AMOXICILLIN AND DOXYCYCLINE  Continue other medications as ordered  Wound care as ordered  Follow up with specialists as indicated  Follow up with Janett Billow in 2 mos for routine visit

## 2018-07-02 ENCOUNTER — Other Ambulatory Visit: Payer: Self-pay

## 2018-07-02 DIAGNOSIS — I890 Lymphedema, not elsewhere classified: Secondary | ICD-10-CM

## 2018-07-02 MED ORDER — CLOTRIMAZOLE-BETAMETHASONE 1-0.05 % EX CREA: 1.0000 "application " | TOPICAL_CREAM | Freq: Two times a day (BID) | CUTANEOUS | 3 refills | Status: DC

## 2018-07-02 NOTE — Telephone Encounter (Signed)
A refill error message was received from the pharmacy due to the wrong DOB on rx for patient. A new rx was sent to the pharmacy for lotrisone cream.

## 2018-07-03 DIAGNOSIS — I130 Hypertensive heart and chronic kidney disease with heart failure and stage 1 through stage 4 chronic kidney disease, or unspecified chronic kidney disease: Secondary | ICD-10-CM | POA: Diagnosis not present

## 2018-07-03 DIAGNOSIS — F22 Delusional disorders: Secondary | ICD-10-CM | POA: Diagnosis not present

## 2018-07-03 DIAGNOSIS — L03116 Cellulitis of left lower limb: Secondary | ICD-10-CM | POA: Diagnosis not present

## 2018-07-03 DIAGNOSIS — L03115 Cellulitis of right lower limb: Secondary | ICD-10-CM | POA: Diagnosis not present

## 2018-07-03 DIAGNOSIS — I890 Lymphedema, not elsewhere classified: Secondary | ICD-10-CM | POA: Diagnosis not present

## 2018-07-03 DIAGNOSIS — I872 Venous insufficiency (chronic) (peripheral): Secondary | ICD-10-CM | POA: Diagnosis not present

## 2018-07-05 DIAGNOSIS — L03115 Cellulitis of right lower limb: Secondary | ICD-10-CM | POA: Diagnosis not present

## 2018-07-05 DIAGNOSIS — I890 Lymphedema, not elsewhere classified: Secondary | ICD-10-CM | POA: Diagnosis not present

## 2018-07-05 DIAGNOSIS — F22 Delusional disorders: Secondary | ICD-10-CM | POA: Diagnosis not present

## 2018-07-05 DIAGNOSIS — L03116 Cellulitis of left lower limb: Secondary | ICD-10-CM | POA: Diagnosis not present

## 2018-07-05 DIAGNOSIS — I872 Venous insufficiency (chronic) (peripheral): Secondary | ICD-10-CM | POA: Diagnosis not present

## 2018-07-05 DIAGNOSIS — I130 Hypertensive heart and chronic kidney disease with heart failure and stage 1 through stage 4 chronic kidney disease, or unspecified chronic kidney disease: Secondary | ICD-10-CM | POA: Diagnosis not present

## 2018-07-08 ENCOUNTER — Ambulatory Visit: Payer: Medicare Other | Attending: Internal Medicine | Admitting: Occupational Therapy

## 2018-07-08 DIAGNOSIS — I890 Lymphedema, not elsewhere classified: Secondary | ICD-10-CM | POA: Insufficient documentation

## 2018-07-08 NOTE — Therapy (Signed)
[place] MAIN [facility] SERVICES [address] [place], [place], [address] Phone: [phone]   Fax:  [phone]  Patient Details  Name: [name] MRN: [id number] Date of Birth: 15-Aug-1932 Referring Provider:  Gildardo Cranker, DO  Encounter Date: 07/08/2018  Pt arrived  for Occupational Therapy initial evaluation to address longstanding BLE lymphedema. Pt is accompanied by her nephew, [name]. Evaluation not completed today as Pt currently received home health services for nursing  / wound care. In keeping w/ Medicare guidelines reimbursement for outpatient therapy services are not reimbursable when Pt is actively receiving home health. Pt rescheduled evaluation. We discussed treatment plan and poor prognosis based on inconsistent attendance and non-compliance with LE self care w/ Max caregiver assistance between visits during last episode of care. Pt and family member verbalized understanding that without consistent attendance and 100% compliance with all LE self care protocols during Intensive CDT with Max A prognosis is poor in the future.  No charge for today's visit. Pt rescheduled OT eval for 07/22/2018. Pt will discuss home health plan with wound care clinic at visit tomorrow.   Andrey Spearman, MS, OTR/L, [facility] 07/08/18 10:55 AM   [place] MAIN [facility] SERVICES [address] [place], [place], [address] Phone: [phone]   Fax:  [phone]

## 2018-07-09 ENCOUNTER — Encounter (HOSPITAL_BASED_OUTPATIENT_CLINIC_OR_DEPARTMENT_OTHER): Payer: Medicare Other | Attending: Internal Medicine

## 2018-07-09 DIAGNOSIS — J449 Chronic obstructive pulmonary disease, unspecified: Secondary | ICD-10-CM | POA: Insufficient documentation

## 2018-07-09 DIAGNOSIS — I110 Hypertensive heart disease with heart failure: Secondary | ICD-10-CM | POA: Diagnosis not present

## 2018-07-09 DIAGNOSIS — I890 Lymphedema, not elsewhere classified: Secondary | ICD-10-CM | POA: Diagnosis not present

## 2018-07-09 DIAGNOSIS — I872 Venous insufficiency (chronic) (peripheral): Secondary | ICD-10-CM | POA: Diagnosis not present

## 2018-07-09 DIAGNOSIS — I87393 Chronic venous hypertension (idiopathic) with other complications of bilateral lower extremity: Secondary | ICD-10-CM | POA: Diagnosis not present

## 2018-07-09 DIAGNOSIS — I130 Hypertensive heart and chronic kidney disease with heart failure and stage 1 through stage 4 chronic kidney disease, or unspecified chronic kidney disease: Secondary | ICD-10-CM | POA: Diagnosis not present

## 2018-07-09 DIAGNOSIS — I5022 Chronic systolic (congestive) heart failure: Secondary | ICD-10-CM | POA: Insufficient documentation

## 2018-07-09 DIAGNOSIS — L03115 Cellulitis of right lower limb: Secondary | ICD-10-CM | POA: Diagnosis not present

## 2018-07-09 DIAGNOSIS — F22 Delusional disorders: Secondary | ICD-10-CM | POA: Diagnosis not present

## 2018-07-09 DIAGNOSIS — L03116 Cellulitis of left lower limb: Secondary | ICD-10-CM | POA: Diagnosis not present

## 2018-07-18 ENCOUNTER — Other Ambulatory Visit: Payer: Self-pay | Admitting: Internal Medicine

## 2018-07-18 DIAGNOSIS — I872 Venous insufficiency (chronic) (peripheral): Secondary | ICD-10-CM | POA: Diagnosis not present

## 2018-07-18 DIAGNOSIS — F22 Delusional disorders: Secondary | ICD-10-CM | POA: Diagnosis not present

## 2018-07-18 DIAGNOSIS — L03116 Cellulitis of left lower limb: Secondary | ICD-10-CM | POA: Diagnosis not present

## 2018-07-18 DIAGNOSIS — I130 Hypertensive heart and chronic kidney disease with heart failure and stage 1 through stage 4 chronic kidney disease, or unspecified chronic kidney disease: Secondary | ICD-10-CM | POA: Diagnosis not present

## 2018-07-18 DIAGNOSIS — I890 Lymphedema, not elsewhere classified: Secondary | ICD-10-CM | POA: Diagnosis not present

## 2018-07-18 DIAGNOSIS — L03115 Cellulitis of right lower limb: Secondary | ICD-10-CM | POA: Diagnosis not present

## 2018-07-22 ENCOUNTER — Ambulatory Visit: Payer: Medicare Other | Admitting: Occupational Therapy

## 2018-07-22 ENCOUNTER — Encounter: Payer: Self-pay | Admitting: Occupational Therapy

## 2018-07-22 NOTE — Patient Instructions (Signed)

## 2018-07-23 ENCOUNTER — Ambulatory Visit: Payer: Medicare Other | Admitting: Occupational Therapy

## 2018-07-23 ENCOUNTER — Other Ambulatory Visit: Payer: Self-pay

## 2018-07-23 ENCOUNTER — Encounter: Payer: Self-pay | Admitting: Occupational Therapy

## 2018-07-23 DIAGNOSIS — I890 Lymphedema, not elsewhere classified: Secondary | ICD-10-CM | POA: Diagnosis not present

## 2018-07-24 ENCOUNTER — Ambulatory Visit: Payer: Medicare Other | Admitting: Occupational Therapy

## 2018-07-25 ENCOUNTER — Ambulatory Visit: Payer: Medicare Other | Admitting: Occupational Therapy

## 2018-07-25 ENCOUNTER — Encounter: Payer: Self-pay | Admitting: Occupational Therapy

## 2018-07-25 DIAGNOSIS — I890 Lymphedema, not elsewhere classified: Secondary | ICD-10-CM

## 2018-07-25 NOTE — Therapy (Addendum)
[place] MAIN [facility] SERVICES [address] [place], [place], [address] Phone: [phone]   Fax:  [phone]  Occupational Therapy Evaluation  Patient Details  Name: [name] MRN: [id number] Date of Birth: 06-14-1932 No data recorded  Encounter Date: 07/23/2018    Past Medical History:  Diagnosis Date  . Anemia, unspecified   . Anxiety   . Arthritis   . Asthma   . Delusions ([place])   . Depression   . Edema 2005   "since put the glass in them"   . GERD (gastroesophageal reflux disease)   . Lumbago   . Lymphedema   . Obstructive chronic bronchitis without exacerbation ([place])   . Other abnormal glucose   . 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 Systolic heart failure ([facility])    EF 35 to 40% per echo January 2015  . Unspecified essential hypertension   . Unspecified nonpsychotic mental disorder   . Unspecified venous (peripheral) insufficiency   . Unspecified venous (peripheral) insufficiency   . Unspecified vitamin D deficiency     Past Surgical History:  Procedure Laterality Date  . APPENDECTOMY    . CERVICAL SPINE SURGERY     x 2  . CHOLECYSTECTOMY    . ESOPHAGOGASTRODUODENOSCOPY N/A 04/05/2015   Procedure: ESOPHAGOGASTRODUODENOSCOPY (EGD);  Surgeon: [name], MD;  Location: [facility];  Service: Endoscopy;  Laterality: N/A;  . FLEXIBLE SIGMOIDOSCOPY N/A 04/05/2015   Procedure: FLEXIBLE SIGMOIDOSCOPY;  Surgeon: [name], MD;  Location: [facility];  Service: Endoscopy;  Laterality: N/A;  . HAND SURGERY    . LUMBAR DISC SURGERY    . PARTIAL HYSTERECTOMY    . TONSILLECTOMY      There were no vitals filed for this visit.    Subjective Assessment - 07/25/18 1646    Subjective   [name] is referred to Occupational Therapy for evaluation and treatment of BLE tymphedema LE) by Teressa Lower, MD. Pt is well known to this OT as she previously underwent Complete Decongestive Therapy (CDT) from 01/07/2016  through 05/08/2016. Therapy was interupted when Pt required wound care for a leg ulcer. She did not resume OT for CDT afterwards. Pt was fit with alternative, adjustable, knee length compression wraps for LY self management. She no longer utilizes any compression and does not erform any LE self -care. She reports she was recently treated for recurrent RLE cellulitis and just finished oral antibiotic course. Home Health nursing has been wrapping her legs bilaterally in [facility] dressings. HH was DC last week to resume OT for CDT. Caregiver, Pt's nephew, verbalized understanding that without diligent, daily caregiver assistance for impecable skin care and daily compression wrap application with clean wraps prognosis is poor.    Patient is accompained by:  Family member    Pertinent History  Chronic bronchitis, CVI, anxiety , systoloc hert failure, asthma, arthritis, multiple spinal surgeries w/ chronic back pain    Limitations  Difficulty walking, decreased standing tolerance, impaired transfer ability, decreased ankle AROM, Pain in BLE, Inability to fit street shoes w/ increased falls risk, increased infection risk.    Patient Stated Goals  reduce swelling and legs feel better    Currently in Pain?  Yes    Pain Score  5     Pain Location  Back    Pain Descriptors / Indicators  Aching    Pain Type  Chronic pain    Pain Onset  More than a month ago  OT Long Term Goals - 07/22/18 1352      OT LONG TERM GOAL #1   Title  Lymphedema (LE) self-care: Pt able to don/doff adjustable, alternative compression garments ( aka CircAif, Francia Greaves) between OT visits with Maximal caregiver assistance within 2 weeks to  facilitate improved prognosis and optimal volume reduction during Intensive CDT.    Baseline  dependent    Time  2    Period  Weeks    Status  Achieved      OT LONG TERM GOAL #2   Title  Pt to achieve at least 10% BLE limb volume reductions during  Intensive CDT to limit LE progression, decrease infection risk, to reduce foot and leg pain, and to improve ability to fit lower body clothing and shoes.    Baseline  dependent    Time  12    Period  Weeks    Status  Partially Met      OT LONG TERM GOAL #3   Title  Pt >/= 85 % compliant with all daily, LE self-care protocols for home program, including simple self-manual lymphatic drainage (MLD), skin care, lymphatic pumping the ex, skin care, and donning/ doffing compression wraps and garments with needed level of caregiver assistance to limit LE progression and further functional decline.      Baseline  dependent    Time  12    Period  Weeks    Status  Partially Met      OT LONG TERM GOAL #4   Title  Lymphedema (LE) self-care : Pt >/= 85 % compliant with all daily LE self-care protocols, including simple self-manual lymphatic drainage (MLD), skin care, lymphatic pumping therex, and donning/ doffing progression garments with needed level of caregiver assistance to limit LE progression, infection risk and further functional decline.      Baseline  dependent    Time  12    Period  Weeks    Status  Partially Met      OT LONG TERM GOAL #5   Title  Pt to tolerate daily compression wraps, garments and devices in keeping w/ prescribed wear regime within 1 week of issue date to progress and retain clinical and functional gains and to limit LE progression.    Baseline  dependent    Time  12    Period  Weeks    Status  Partially Met                 Patient will benefit from skilled therapeutic intervention in order to improve the following deficits and impairments:  Decreased range of motion, Difficulty walking, Impaired flexibility, Increased edema, Decreased activity tolerance, Decreased skin integrity, Decreased balance, Decreased knowledge of use of DME, Decreased scar mobility, Pain, Decreased mobility, Decreased knowledge of precautions, Abnormal gait, Decreased cognition,  Decreased endurance, Decreased safety awareness, Decreased strength, Impaired vision/preception, Impaired perceived functional ability  Visit Diagnosis: Lymphedema, not elsewhere classified - Plan: Ot plan of care cert/re-cert    Problem List Patient Active Problem List   Diagnosis Date Noted  . Chronic pain of multiple joints 08/01/2017  . Age-related osteoporosis without current pathological fracture 08/01/2017  . Left knee pain 09/19/2016  . Refractory anemia, unspecified ([place]) 08/10/2016  . Headache 06/22/2015  . Rectal benign neoplasm   . Asthma, chronic 04/04/2015  . history of Tubular adenoma of colon 04/04/2015  . 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 Hypokalemia 04/04/2015  . CKD (chronic kidney disease) stage 3, GFR 30-59 ml/min (  [place]) 04/03/2015  . Dehydration 04/03/2015  . Skin ulcer ([place])   . History of colonic polyps 12/08/2014  . Edema 04/14/2014  . Chronic systolic heart failure ([place]) 11/20/2013  . Nausea with vomiting 11/13/2013  . Lymphedema of lower extremity 10/17/2013  . 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 MGUS (monoclonal gammopathy of unknown significance) 05/15/2012  . VITAMIN D DEFICIENCY 01/29/2010  . BURSITIS, LEFT ELBOW 01/29/2010  . DERMATITIS 09/30/2009  . DIABETES MELLITUS, BORDERLINE 01/11/2009  . Paranoid delusion ([place]) 08/02/2008  . Obstructive chronic bronchitis ([place]) 08/02/2008  . Essential hypertension 11/11/2007  . Venous (peripheral) insufficiency 11/11/2007  . RENAL CALCULUS 11/11/2007  . LOW BACK PAIN, CHRONIC 11/11/2007    Andrey Spearman, MS, OTR/L, [facility] 07/25/18 5:06 PM  [place] MAIN [facility] SERVICES [address] [place], [place], [address] Phone: [phone]   Fax:  [phone]  Name: TAMMRA PRESSMAN MRN: [id number] Date of Birth: 1932/07/03

## 2018-07-25 NOTE — Therapy (Signed)
[place] MAIN [facility] ([facility] SERVICES [address] [place], [place], [address] Phone: [phone]   Fax:  [phone]  Occupational Therapy Treatment  Patient Details  Name: [name] MRN: [id number] Date of Birth: 1932/07/23 No data recorded  Encounter Date: 07/25/2018  OT End of Session - 07/25/18 1728    Visit Number  2    Number of Visits  36    Date for OT Re-Evaluation  10/20/18    OT Start Time  0914    OT Stop Time  1011    OT Time Calculation (min)  57 min    Activity Tolerance  Patient tolerated treatment well;No increased pain    Behavior During Therapy  WFL for tasks assessed/performed       Past Medical History:  Diagnosis Date  . Anemia, unspecified   . Anxiety   . Arthritis   . Asthma   . Delusions ([place])   . Depression   . Edema 2005   "since put the glass in them"   . GERD (gastroesophageal reflux disease)   . Lumbago   . Lymphedema   . Obstructive chronic bronchitis without exacerbation ([place])   . Other abnormal glucose   . 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 Systolic heart failure (HCC)    EF 35 to 40% per echo January 2015  . Unspecified essential hypertension   . Unspecified nonpsychotic mental disorder   . Unspecified venous (peripheral) insufficiency   . Unspecified venous (peripheral) insufficiency   . Unspecified vitamin D deficiency     Past Surgical History:  Procedure Laterality Date  . APPENDECTOMY    . CERVICAL SPINE SURGERY     x 2  . CHOLECYSTECTOMY    . ESOPHAGOGASTRODUODENOSCOPY N/A 04/05/2015   Procedure: ESOPHAGOGASTRODUODENOSCOPY (EGD);  Surgeon: [name], MD;  Location: [facility];  Service: Endoscopy;  Laterality: N/A;  . FLEXIBLE SIGMOIDOSCOPY N/A 04/05/2015   Procedure: FLEXIBLE SIGMOIDOSCOPY;  Surgeon: [name], MD;  Location: [facility];  Service: Endoscopy;  Laterality: N/A;  . HAND SURGERY    . LUMBAR DISC SURGERY    . PARTIAL HYSTERECTOMY    . TONSILLECTOMY       There were no vitals filed for this visit.  Subjective Assessment - 07/25/18 1724    Subjective   [name] missed appointmnent yesterday due to no transportation. Pt has not utilized wraps since last seen. Legs are markedly swollen today with spotty lymphorrhea and spots of blood below knees, R>L. Pt is accompanied by her neoophew. She brings clean wraps to clinic.    Patient is accompained by:  Family member    Pertinent History  Chronic bronchitis, CVI, anxiety , systoloc hert failure, asthma, arthritis, multiple spinal surgeries w/ chronic back pain    Limitations  Difficulty walking, decreased standing tolerance, impaired transfer ability, decreased ankle AROM, Pain in BLE, Inability to fit street shoes w/ increased falls risk, increased infection risk.    Patient Stated Goals  reduce swelling and legs feel better    Pain Onset  More than a month ago                   OT Treatments/Exercises (OP) - 07/25/18 0001      ADLs   ADL Education Given  Yes      Manual Therapy   Manual Therapy  Edema management;Compression Bandaging             OT Education - 07/25/18 1726  Education provided  Yes    Education Details  Continued skilled Pt/caregiver education  And LE ADL training throughout visit for lymphedema self care/ home program, including compression wrapping, compression garment and device wear/care, lymphatic pumping ther ex, simple self-MLD, and skin care. Discussed progress towards goals    Person(s) Educated  Patient;Caregiver(s)    Methods  Explanation;Demonstration;Handout    Comprehension  Verbalized understanding;Verbal cues required;Returned demonstration          OT Long Term Goals - 07/22/18 1352      OT LONG TERM GOAL #1   Title  Lymphedema (LE) self-care: Pt able to don/doff adjustable, alternative compression garments ( aka CircAif, Francia Greaves) between OT visits with Maximal caregiver assistance within 2 weeks to  facilitate improved  prognosis and optimal volume reduction during Intensive CDT.    Baseline  dependent    Time  2    Period  Weeks    Status  Achieved      OT LONG TERM GOAL #2   Title  Pt to achieve at least 10% BLE limb volume reductions during Intensive CDT to limit LE progression, decrease infection risk, to reduce foot and leg pain, and to improve ability to fit lower body clothing and shoes.    Baseline  dependent    Time  12    Period  Weeks    Status  Partially Met      OT LONG TERM GOAL #3   Title  Pt >/= 85 % compliant with all daily, LE self-care protocols for home program, including simple self-manual lymphatic drainage (MLD), skin care, lymphatic pumping the ex, skin care, and donning/ doffing compression wraps and garments with needed level of caregiver assistance to limit LE progression and further functional decline.      Baseline  dependent    Time  12    Period  Weeks    Status  Partially Met      OT LONG TERM GOAL #4   Title  Lymphedema (LE) self-care : Pt >/= 85 % compliant with all daily LE self-care protocols, including simple self-manual lymphatic drainage (MLD), skin care, lymphatic pumping therex, and donning/ doffing progression garments with needed level of caregiver assistance to limit LE progression, infection risk and further functional decline.      Baseline  dependent    Time  12    Period  Weeks    Status  Partially Met      OT LONG TERM GOAL #5   Title  Pt to tolerate daily compression wraps, garments and devices in keeping w/ prescribed wear regime within 1 week of issue date to progress and retain clinical and functional gains and to limit LE progression.    Baseline  dependent    Time  12    Period  Weeks    Status  Partially Met            Plan - 07/25/18 1728    Clinical Impression Statement  Emophasis of  OT session today on Pt and CG edu for multi-layer, gradient compression wrapping. By end of session CG able to appy knee length gradient wrap sing  correct techniques with max assist, including verbal and tactile cues and modeling. Stron emphasis  today throughout session on fact that outcome is dependent on 100% compliance with all aspects of home program on daily basis, and Pt requires consistent caregiver assistance , or prognosis remains poor. [place] per POC.    Occupational performance deficits (Please  refer to evaluation for details):  ADL's;IADL's;Rest and Sleep;Play;Leisure;Social Participation;Other   body image   Rehab Potential  Fair    Current Impairments/barriers affecting progress:  Pt presents with decreased prognosis 2/2 overall health status w/ multiple comorbidities and history of non-compliance with previous  lymphedema treatment.    OT Frequency  3x / week    OT Duration  12 weeks   and PRN   OT Treatment/Interventions  Self-care/ADL training;DME and/or AE instruction;Manual lymph drainage;Patient/family education;Compression bandaging;Therapeutic exercise;Therapeutic activities;Manual Therapy;Therapist, nutritional;Other (comment)    Plan  skin care to decrease infection risk and improve hydration    Clinical Decision Making  Multiple treatment options, significant modification of task necessary    Recommended Other Services  fit with adjustable , alternative compression wraps for optimal independence w/ donning and doffing    Consulted and Agree with Plan of Care  Patient;Family member/caregiver       Patient will benefit from skilled therapeutic intervention in order to improve the following deficits and impairments:  Decreased range of motion, Difficulty walking, Impaired flexibility, Increased edema, Decreased activity tolerance, Decreased skin integrity, Decreased balance, Decreased knowledge of use of DME, Decreased scar mobility, Pain, Decreased mobility, Decreased knowledge of precautions, Abnormal gait, Decreased cognition, Decreased endurance, Decreased safety awareness, Decreased strength, Impaired  vision/preception, Impaired perceived functional ability  Visit Diagnosis: Lymphedema, not elsewhere classified    Problem List Patient Active Problem List   Diagnosis Date Noted  . Chronic pain of multiple joints 08/01/2017  . Age-related osteoporosis without current pathological fracture 08/01/2017  . Left knee pain 09/19/2016  . Refractory anemia, unspecified ([place]) 08/10/2016  . Headache 06/22/2015  . Rectal benign neoplasm   . Asthma, chronic 04/04/2015  . history of Tubular adenoma of colon 04/04/2015  . 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 Hypokalemia 04/04/2015  . CKD (chronic kidney disease) stage 3, GFR 30-59 ml/min (HCC) 04/03/2015  . Dehydration 04/03/2015  . Skin ulcer ([place])   . History of colonic polyps 12/08/2014  . Edema 04/14/2014  . Chronic systolic heart failure ([place]) 11/20/2013  . Nausea with vomiting 11/13/2013  . Lymphedema of lower extremity 10/17/2013  . 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 MGUS (monoclonal gammopathy of unknown significance) 05/15/2012  . VITAMIN D DEFICIENCY 01/29/2010  . BURSITIS, LEFT ELBOW 01/29/2010  . DERMATITIS 09/30/2009  . DIABETES MELLITUS, BORDERLINE 01/11/2009  . Paranoid delusion ([place]) 08/02/2008  . Obstructive chronic bronchitis ([place]) 08/02/2008  . Essential hypertension 11/11/2007  . Venous (peripheral) insufficiency 11/11/2007  . RENAL CALCULUS 11/11/2007  . LOW BACK PAIN, CHRONIC 11/11/2007    Andrey Spearman, MS, OTR/L, [facility] 07/25/18 5:32 PM   [place] MAIN [facility] SERVICES [address] [place], [place], [address] Phone: [phone]   Fax:  [phone]  Name: [name] MRN: [id number] Date of Birth: 1932/02/15

## 2018-07-25 NOTE — Therapy (Signed)
[place] MAIN [facility] SERVICES [address] [place], [place], [address] Phone: [phone]   Fax:  [phone]  Occupational Therapy Evaluation  Patient Details  Name: [name] MRN: [id number] Date of Birth: June 12, 1940 No data recorded  Encounter Date: 07/23/2018  OT End of Session - 07/25/18 1719    Visit Number  1    Number of Visits  36    Date for OT Re-Evaluation  10/20/18    Activity Tolerance  Patient tolerated treatment well;No increased pain    Behavior During Therapy  WFL for tasks assessed/performed       Past Medical History:  Diagnosis Date  . Anemia, unspecified   . Anxiety   . Arthritis   . Asthma   . Delusions ([place])   . Depression   . Edema 2005   "since put the glass in them"   . GERD (gastroesophageal reflux disease)   . Lumbago   . Lymphedema   . Obstructive chronic bronchitis without exacerbation ([place])   . Other abnormal glucose   . 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 Systolic heart failure (HCC)    EF 35 to 40% per echo January 2015  . Unspecified essential hypertension   . Unspecified nonpsychotic mental disorder   . Unspecified venous (peripheral) insufficiency   . Unspecified venous (peripheral) insufficiency   . Unspecified vitamin D deficiency     Past Surgical History:  Procedure Laterality Date  . APPENDECTOMY    . CERVICAL SPINE SURGERY     x 2  . CHOLECYSTECTOMY    . ESOPHAGOGASTRODUODENOSCOPY N/A 04/05/2015   Procedure: ESOPHAGOGASTRODUODENOSCOPY (EGD);  Surgeon: [name], MD;  Location: [facility];  Service: Endoscopy;  Laterality: N/A;  . FLEXIBLE SIGMOIDOSCOPY N/A 04/05/2015   Procedure: FLEXIBLE SIGMOIDOSCOPY;  Surgeon: [name], MD;  Location: [facility];  Service: Endoscopy;  Laterality: N/A;  . HAND SURGERY    . LUMBAR DISC SURGERY    . PARTIAL HYSTERECTOMY    . TONSILLECTOMY      There were no vitals filed for this visit.  Subjective Assessment -  07/25/18 1646    Subjective   [name] is referred to Occupational Therapy for evaluation and treatment of BLE tymphedema LE) by Teressa Lower, MD. Pt is well known to this OT as she previously underwent Complete Decongestive Therapy (CDT) from 01/07/2016 through 05/08/2016. Therapy was interupted when Pt required wound care for a leg ulcer. She did not resume OT for CDT afterwards. Pt was fit with alternative, adjustable, knee length compression wraps for LY self management. She no longer utilizes any compression and does not erform any LE self -care. She reports she was recently treated for recurrent RLE cellulitis and just finished oral antibiotic course. Home Health nursing has been wrapping her legs bilaterally in [facility] dressings. HH was DC last week to resume OT for CDT. Caregiver, Pt's nephew, verbalized understanding that without diligent, daily caregiver assistance for impecable skin care and daily compression wrap application with clean wraps prognosis is poor.    Patient is accompained by:  Family member    Pertinent History  Chronic bronchitis, CVI, anxiety , systoloc hert failure, asthma, arthritis, multiple spinal surgeries w/ chronic back pain    Limitations  Difficulty walking, decreased standing tolerance, impaired transfer ability, decreased ankle AROM, Pain in BLE, Inability to fit street shoes w/ increased falls risk, increased infection risk.    Patient Stated Goals  reduce swelling and legs  feel better    Currently in Pain?  Yes    Pain Score  5     Pain Location  Back    Pain Descriptors / Indicators  Aching    Pain Type  Chronic pain    Pain Onset  More than a month ago        [facility] OT Assessment - 07/25/18 0001      Assessment   Medical Diagnosis  Mild, stage III, BLE lymphedema (LE) 22 CVI, L>R    Onset Date/Surgical Date  06/05/05    Prior Therapy  OT for CDT w/ this therapisty 01/07/16 - 05/08/16 for 14 visits. Good results initially, but compliance, attendance  and family support decreased over time. Therapy was ultimately disrupted  by wound.. Pt was fitted with adjustable , alternative, knee length compression garments for daytime use (F[place] wraps) due to no assistance with donning and doffing  elastic comnpression garments    Referring Provider (PT)  Gildardo Cranker, DO      Precautions   Precautions  Fall;Other (comment)    Precaution Comments  LE precautions (hx recurrent cellulitis)      Home  Environment   Family/patient expects to be discharged to:  Private residence    Living Arrangements  Alone    Available Help at Discharge  Other (Comment)   TBD. Historically daily assistance w CDT was inconsistent     Prior Function   Level of Independence  Independent with household mobility with device;Independent with community mobility with device;Independent with transfers;Requires assistive device for independence;Needs assistance with ADLs    Vocation  Retired    Leisure  family      Mobility   Mobility Status  History of falls;Needs assist    Mobility Status Comments  transport WC      Activity Tolerance   Activity Tolerance  Tolerates 30 min activity with multiple rests    Sitting Balance  Supports self independently with both upper extremities      Cognition   Overall Cognitive Status  History of cognitive impairments - at baseline    Mini Mental State Exam   Pt poor historian 2/2 psych do    Attention  --   impaired   Memory  Impaired    Problem Solving  Impaired    Behaviors  --   delusions     Observation/Other Assessments   Skin Integrity  Legs below knees are scarred w/ redness and  hemosiderin staining, L>R. Swelling extends from knees to toes, L>R. Tender to touch. generally well hydrated. Temperature cool to the touch. Deep skin screased at anterior ankles and base of toes w/ mild lobules around base of malleoli. No signs of skin breakdown or infection.      Posture/Postural Control   Posture/Postural Control  No  significant limitations      Sensation   Light Touch  Impaired by gross assessment    Additional Comments  BLE periferal neuropathy -feet      Coordination   Gross Motor Movements are Fluid and Coordinated  Not tested    Fine Motor Movements are Fluid and Coordinated  Not tested      AROM   Overall AROM   Deficits    Overall AROM Comments  swelling limits ankle , foot and knee AROM bilaterally      Strength   Overall Strength  Deficits    Overall Strength Comments  decreased BLE strength         Mild,  Stage III BLE 2/2 CVI w/ suspected primary component Skin Description Hyper-Keratosis Peau' de Orange Shiny Tight Fibrotic Fatty Doughy Indurated   L foot-mild  x x x  x    x   Hydration Dry Flaky Erythema Other   x x x    Color Redness Present Pallor Blanching Hemosiderin Staining Other   x   x    Odor Malodorous Yeast  Absent      x   Temperature Warm Cool wnl        Pitting Edema   1+ 2+ 3+ 4+ Non-pitting         x   Girth Symmetrical Asymmetrical Other Distribution    L>R L>R   Stemmer Sign Positive Negative     STRONG +     Lymphorrea History Of:  Present Absent   x x     Wounds History Of Present Absent Venous Arterial Pressure Size    x  x x         Signs of Infection Redness Warmth Erythema Acute Swelling Drainage Borders   x                Scars Adhesions Hypersensitivity        Sensation Light Touch Deep pressure Hypersensitivty   Present Impaired Present Impaired Absent Impaired     x      x  Nails WNL Fungus Present Other   x     Hair Growth Symmetrical Asymmetrical   unremarkable    Skin Creases Base of toes Ankle Base of Finger Medial Thigh           x                     OT Education - 07/25/18 1719    Education provided  Yes    Education Details  Provided Pt and family skilled education regarding lymphatic structure and function, lymphedema etiologies, onset patterns and stages of progression.  Discussed  impact of obesity on lymphatic system function. Outlined Complete Decongestive Therapy (CDT)  as standard of LE care and provided in depth information regarding 4 primary components of both Intensive and Self Management Phases, including Manual Lymph Drainage (MLD), compression wrapping and garments, skin care, and therapeutic exercise.   Discussed   Importance of daily, ongoing LE self-care essential to retaining clinical gains and limiting progression.  Lastly, reviewed lymphedema precautions, including cellulitis risk and difficulty with wound healing. Provided printed Lymphedema Workbook for reference.    Person(s) Educated  Patient    Methods  Explanation;Demonstration;Handout    Comprehension  Verbalized understanding;Verbal cues required;Returned demonstration          OT Long Term Goals - 07/22/18 1352      OT LONG TERM GOAL #1   Title  Lymphedema (LE) self-care: Pt able to don/doff adjustable, alternative compression garments ( aka CircAif, Francia Greaves) between OT visits with Maximal caregiver assistance within 2 weeks to  facilitate improved prognosis and optimal volume reduction during Intensive CDT.    Baseline  dependent    Time  2    Period  Weeks    Status  Achieved      OT LONG TERM GOAL #2   Title  Pt to achieve at least 10% BLE limb volume reductions during Intensive CDT to limit LE progression, decrease infection risk, to reduce foot and leg pain, and to improve ability to fit lower body clothing and shoes.    Baseline  dependent    Time  12    Period  Weeks    Status  Partially Met      OT LONG TERM GOAL #3   Title  Pt >/= 85 % compliant with all daily, LE self-care protocols for home program, including simple self-manual lymphatic drainage (MLD), skin care, lymphatic pumping the ex, skin care, and donning/ doffing compression wraps and garments with needed level of caregiver assistance to limit LE progression and further functional decline.      Baseline   dependent    Time  12    Period  Weeks    Status  Partially Met      OT LONG TERM GOAL #4   Title  Lymphedema (LE) self-care : Pt >/= 85 % compliant with all daily LE self-care protocols, including simple self-manual lymphatic drainage (MLD), skin care, lymphatic pumping therex, and donning/ doffing progression garments with needed level of caregiver assistance to limit LE progression, infection risk and further functional decline.      Baseline  dependent    Time  12    Period  Weeks    Status  Partially Met      OT LONG TERM GOAL #5   Title  Pt to tolerate daily compression wraps, garments and devices in keeping w/ prescribed wear regime within 1 week of issue date to progress and retain clinical and functional gains and to limit LE progression.    Baseline  dependent    Time  12    Period  Weeks    Status  Partially Met            Plan - 07/25/18 1719    Clinical Impression Statement  [place] [name] is an 17 y o female referred to Occupational Therapy for evaluation and treatment of BLE lymphedema (LE) by Gildardo Cranker, DO. Pt is well known to this therapist. She underwent Intensive Phase Complete Decongestive Therapy, (CDT) consisting of manual lymphatic drainage, (MLD) skin care, compression wrapping  therapeutic exercise, and extensive caregiver training, for 14 visits from 01/07/2016 to 05/08/2016. Pt was fitted with alternative, adjustable, knee length compression garments with Velcro closures in effort to improve independent donning and doffing. The greatest obstacles to progress towards goals was poor attendance, tardiness, poor compliance with LE self-care home program, protocols and inconsistent caregiver support. Pt returns today with progression of LE after recent re-occurence of LE cellulitis.  Pt presents with mild-moderate, stage III , BLE lymphedema secondary to CVI. Pt has been receiving home health nursing services for compression wrapping with Una Boots bilaterally  below the knees. Compression wraps and garments issued previously are not available for assessment as they are misplaced.   Worsened BLE  lymphedema impairs Pt's ability to perform basic and instrumental ADLs, to participatien in leisure and productive activities. to participate in social activities, and to safely ambulate and performn transfers. Ms. Leisey  will benefit from skilled Occupational Therapy for Intensive and Management Phase Complete Decongestive Therapy (CDT), to include manual lymphatic drainage, skin care, therapeutic exercise and compression therapy to reduce LUE/ LUQ swelling, to return limb to typical size and shape, to reduce infection risk and to regain functional arm and hand use. Emphasis throughout OT course will focus on Pt education for long term LE self-care. Without skilled OT for CDT LE will continue to progress and further functional decline is expected.    Occupational performance deficits (Please refer to evaluation for details):  ADL's;IADL's;Rest and Sleep;Play;Leisure;Social Participation;Other   body  image   Rehab Potential  Fair    Current Impairments/barriers affecting progress:  Pt presents with decreased prognosis 2/2 overall health status w/ multiple comorbidities and history of non-compliance with previous  lymphedema treatment.    OT Frequency  3x / week    OT Duration  12 weeks   and PRN   OT Treatment/Interventions  Self-care/ADL training;DME and/or AE instruction;Manual lymph drainage;Patient/family education;Compression bandaging;Therapeutic exercise;Therapeutic activities;Manual Therapy;Therapist, nutritional;Other (comment)    Plan  skin care to decrease infection risk and improve hydration    Clinical Decision Making  Multiple treatment options, significant modification of task necessary    Recommended Other Services  fit with adjustable , alternative compression wraps for optimal independence w/ donning and doffing    Consulted and Agree with Plan  of Care  Patient;Family member/caregiver       Patient will benefit from skilled therapeutic intervention in order to improve the following deficits and impairments:  Decreased range of motion, Difficulty walking, Impaired flexibility, Increased edema, Decreased activity tolerance, Decreased skin integrity, Decreased balance, Decreased knowledge of use of DME, Decreased scar mobility, Pain, Decreased mobility, Decreased knowledge of precautions, Abnormal gait, Decreased cognition, Decreased endurance, Decreased safety awareness, Decreased strength, Impaired vision/preception, Impaired perceived functional ability  Visit Diagnosis: Lymphedema, not elsewhere classified - Plan: Ot plan of care cert/re-cert    Problem List Patient Active Problem List   Diagnosis Date Noted  . Chronic pain of multiple joints 08/01/2017  . Age-related osteoporosis without current pathological fracture 08/01/2017  . Left knee pain 09/19/2016  . Refractory anemia, unspecified ([place]) 08/10/2016  . Headache 06/22/2015  . Rectal benign neoplasm   . Asthma, chronic 04/04/2015  . history of Tubular adenoma of colon 04/04/2015  . 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 Hypokalemia 04/04/2015  . CKD (chronic kidney disease) stage 3, GFR 30-59 ml/min (HCC) 04/03/2015  . Dehydration 04/03/2015  . Skin ulcer ([place])   . History of colonic polyps 12/08/2014  . Edema 04/14/2014  . Chronic systolic heart failure ([place]) 11/20/2013  . Nausea with vomiting 11/13/2013  . Lymphedema of lower extremity 10/17/2013  . 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 MGUS (monoclonal gammopathy of unknown significance) 05/15/2012  . VITAMIN D DEFICIENCY 01/29/2010  . BURSITIS, LEFT ELBOW 01/29/2010  . DERMATITIS 09/30/2009  . DIABETES MELLITUS, BORDERLINE 01/11/2009  . Paranoid delusion ([place]) 08/02/2008  . Obstructive chronic bronchitis ([place]) 08/02/2008  . Essential hypertension 11/11/2007  . Venous (peripheral) insufficiency  11/11/2007  . RENAL CALCULUS 11/11/2007  . LOW BACK PAIN, CHRONIC 11/11/2007   Andrey Spearman, MS, OTR/L, [facility] 07/25/18 5:21 PM   [place] MAIN [facility] SERVICES [address] [place], [place], [address] Phone: [phone]   Fax:  [phone]  Name: MONAYE BLACKIE MRN: [id number] Date of Birth: 26-Jan-1932

## 2018-07-29 ENCOUNTER — Ambulatory Visit: Payer: Medicare Other | Admitting: Occupational Therapy

## 2018-07-29 DIAGNOSIS — I890 Lymphedema, not elsewhere classified: Secondary | ICD-10-CM | POA: Diagnosis not present

## 2018-07-29 NOTE — Therapy (Signed)
[place] MAIN [facility] SERVICES [address] [place], [place], [address] Phone: [phone]   Fax:  [phone]  Occupational Therapy Treatment  Patient Details  Name: [name] MRN: [id number] Date of Birth: [birth year] No data recorded  Encounter Date: 07/29/2018  OT End of Session - 07/29/18 0918    Visit Number  3    Number of Visits  36    Date for OT Re-Evaluation  10/20/18    OT Start Time  0808    OT Stop Time  0905    OT Time Calculation (min)  57 min    Activity Tolerance  Patient tolerated treatment well;No increased pain    Behavior During Therapy  WFL for tasks assessed/performed       Past Medical History:  Diagnosis Date  . Anemia, unspecified   . Anxiety   . Arthritis   . Asthma   . Delusions ([place])   . Depression   . Edema 2005   "since put the glass in them"   . GERD (gastroesophageal reflux disease)   . Lumbago   . Lymphedema   . Obstructive chronic bronchitis without exacerbation ([place])   . Other abnormal glucose   . 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[place]) 04/04/2013  . Systolic heart failure (HCC)    EF 35 to 40% per echo January 2015  . Unspecified essential hypertension   . Unspecified nonpsychotic mental disorder   . Unspecified venous (peripheral) insufficiency   . Unspecified venous (peripheral) insufficiency   . Unspecified vitamin D deficiency     Past Surgical History:  Procedure Laterality Date  . APPENDECTOMY    . CERVICAL SPINE SURGERY     x 2  . CHOLECYSTECTOMY    . ESOPHAGOGASTRODUODENOSCOPY N/A 04/05/2015   Procedure: ESOPHAGOGASTRODUODENOSCOPY (EGD);  Surgeon: [name], MD;  Location: [facility];  Service: Endoscopy;  Laterality: N/A;  . FLEXIBLE SIGMOIDOSCOPY N/A 04/05/2015   Procedure: FLEXIBLE SIGMOIDOSCOPY;  Surgeon: [name], MD;  Location: [facility];  Service: Endoscopy;  Laterality: N/A;  . HAND SURGERY    . LUMBAR DISC SURGERY    . PARTIAL HYSTERECTOMY    . TONSILLECTOMY       There were no vitals filed for this visit.  Subjective Assessment - 07/29/18 0919    Subjective   Mrs. Salahuddin presents for OT visit 3/36 for Intensive Phase CDT to BLE. Pt is accompanied by her newphew, [name]. Her comrpession wraps are in place bilaterally. [name] states, " I wrapped them . I have a ways to go before I'm any good at it."    Patient is accompained by:  Family member    Pertinent History  Chronic bronchitis, CVI, anxiety , systoloc hert failure, asthma, arthritis, multiple spinal surgeries w/ chronic back pain    Limitations  Difficulty walking, decreased standing tolerance, impaired transfer ability, decreased ankle AROM, Pain in BLE, Inability to fit street shoes w/ increased falls risk, increased infection risk.    Patient Stated Goals  reduce swelling and legs feel better    Currently in Pain?  Yes    Pain Score  1     Pain Location  Leg    Pain Orientation  Right;Left    Pain Descriptors / Indicators  Tender;Sore    Pain Type  Chronic pain    Pain Onset  More than a month ago                   OT Treatments/Exercises (  OP) - 07/29/18 0001      ADLs   ADL Education Given  Yes      Manual Therapy   Manual Therapy  Edema management;Manual Lymphatic Drainage (MLD);Compression Bandaging    Manual therapy comments  skin care to BLE below the knees using Eucerin low ph lotion to improve hydration.    Edema Management  Skin is very dry at distal legs and feet. When removing compression wraps this morning dried beads of blood noted at several areas on stockinett at distal leg. Skin appears abraided at these locations. No open wounds or signs/ symptoms of infection observed.    Manual Lymphatic Drainage (MLD)  Commenced MLD to LLE utilizing sjort neck sequence, deep abdominals and functional inguinaol pathway.    Compression Bandaging  multi layer compression wraps reapplied as established- 4 layers below the knee to the LLE and 3 layers  and 3 layers below knee to  RLE.             OT Education - 07/29/18 1150    Education provided  Yes    Education Details  Cont Pt and CG edu for proper application of gradient compression wraps and skin care throughout session    Person(s) Educated  Patient;Caregiver(s)    Methods  Explanation;Demonstration;Handout    Comprehension  Verbalized understanding;Verbal cues required;Returned demonstration          OT Long Term Goals - 07/22/18 1352      OT LONG TERM GOAL #1   Title  Lymphedema (LE) self-care: Pt able to don/doff adjustable, alternative compression garments ( aka CircAif, Francia Greaves) between OT visits with Maximal caregiver assistance within 2 weeks to  facilitate improved prognosis and optimal volume reduction during Intensive CDT.    Baseline  dependent    Time  2    Period  Weeks    Status  Achieved      OT LONG TERM GOAL #2   Title  Pt to achieve at least 10% BLE limb volume reductions during Intensive CDT to limit LE progression, decrease infection risk, to reduce foot and leg pain, and to improve ability to fit lower body clothing and shoes.    Baseline  dependent    Time  12    Period  Weeks    Status  Partially Met      OT LONG TERM GOAL #3   Title  Pt >/= 85 % compliant with all daily, LE self-care protocols for home program, including simple self-manual lymphatic drainage (MLD), skin care, lymphatic pumping the ex, skin care, and donning/ doffing compression wraps and garments with needed level of caregiver assistance to limit LE progression and further functional decline.      Baseline  dependent    Time  12    Period  Weeks    Status  Partially Met      OT LONG TERM GOAL #4   Title  Lymphedema (LE) self-care : Pt >/= 85 % compliant with all daily LE self-care protocols, including simple self-manual lymphatic drainage (MLD), skin care, lymphatic pumping therex, and donning/ doffing progression garments with needed level of caregiver assistance to limit LE progression,  infection risk and further functional decline.      Baseline  dependent    Time  12    Period  Weeks    Status  Partially Met      OT LONG TERM GOAL #5   Title  Pt to tolerate daily compression wraps, garments and  devices in keeping w/ prescribed wear regime within 1 week of issue date to progress and retain clinical and functional gains and to limit LE progression.    Baseline  dependent    Time  12    Period  Weeks    Status  Partially Met            Plan - 07/29/18 1151    Clinical Impression Statement  Nephew  did a very nice job with multilayer gradient compression wraps to BLE over the visit interval. Overall technique was very well done. Continued to provide additional training throughout session. Skin is very dry at distal legs and feet. When removing compression wraps this morning dried beads of blood noted at several areas on stockinett at distal leg. Skin appears abraided at these locations. No open wounds or signs/ symptoms of infection observed. Commenced MLD to LLE. Applied compression wraps bilaterally as established at end of session.    Occupational performance deficits (Please refer to evaluation for details):  ADL's;IADL's;Rest and Sleep;Play;Leisure;Social Participation;Other   body image   Rehab Potential  Fair    Current Impairments/barriers affecting progress:  Pt presents with decreased prognosis 2/2 overall health status w/ multiple comorbidities and history of non-compliance with previous  lymphedema treatment.    OT Frequency  3x / week    OT Duration  12 weeks   and PRN   OT Treatment/Interventions  Self-care/ADL training;DME and/or AE instruction;Manual lymph drainage;Patient/family education;Compression bandaging;Therapeutic exercise;Therapeutic activities;Manual Therapy;Therapist, nutritional;Other (comment)    Plan  skin care to decrease infection risk and improve hydration    Clinical Decision Making  Multiple treatment options, significant  modification of task necessary    Recommended Other Services  fit with adjustable , alternative compression wraps for optimal independence w/ donning and doffing    Consulted and Agree with Plan of Care  Patient;Family member/caregiver       Patient will benefit from skilled therapeutic intervention in order to improve the following deficits and impairments:  Decreased range of motion, Difficulty walking, Impaired flexibility, Increased edema, Decreased activity tolerance, Decreased skin integrity, Decreased balance, Decreased knowledge of use of DME, Decreased scar mobility, Pain, Decreased mobility, Decreased knowledge of precautions, Abnormal gait, Decreased cognition, Decreased endurance, Decreased safety awareness, Decreased strength, Impaired vision/preception, Impaired perceived functional ability  Visit Diagnosis: Lymphedema, not elsewhere classified    Problem List Patient Active Problem List   Diagnosis Date Noted  . Chronic pain of multiple joints 08/01/2017  . Age-related osteoporosis without current pathological fracture 08/01/2017  . Left knee pain 09/19/2016  . Refractory anemia, unspecified ([place]) 08/10/2016  . Headache 06/22/2015  . Rectal benign neoplasm   . Asthma, chronic 04/04/2015  . history of Tubular adenoma of colon 04/04/2015  . 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 Hypokalemia 04/04/2015  . CKD (chronic kidney disease) stage 3, GFR 30-59 ml/min (HCC) 04/03/2015  . Dehydration 04/03/2015  . Skin ulcer ([place])   . History of colonic polyps 12/08/2014  . Edema 04/14/2014  . Chronic systolic heart failure ([place]) 11/20/2013  . Nausea with vomiting 11/13/2013  . Lymphedema of lower extremity 10/17/2013  . 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 MGUS (monoclonal gammopathy of unknown significance) 05/15/2012  . VITAMIN D DEFICIENCY 01/29/2010  . BURSITIS, LEFT ELBOW 01/29/2010  . DERMATITIS 09/30/2009  . DIABETES MELLITUS, BORDERLINE 01/11/2009  .  Paranoid delusion ([place]) 08/02/2008  . Obstructive chronic bronchitis ([place]) 08/02/2008  . Essential hypertension 11/11/2007  . Venous (peripheral) insufficiency 11/11/2007  .  RENAL CALCULUS 11/11/2007  . LOW BACK PAIN, CHRONIC 11/11/2007    Andrey Spearman, MS, OTR/L, [facility] 07/29/18 11:55 AM   [place] MAIN [facility] SERVICES [address] [place], [place], [address] Phone: [phone]   Fax:  [phone]  Name: NAIRI OSWALD MRN: [id number] Date of Birth: [birth year]

## 2018-07-30 ENCOUNTER — Telehealth: Payer: Self-pay | Admitting: *Deleted

## 2018-07-30 ENCOUNTER — Ambulatory Visit: Payer: Medicare Other | Admitting: Occupational Therapy

## 2018-07-30 NOTE — Telephone Encounter (Signed)
[name], nephew called and left message on Clinical intake stating that [facility] was to be discharging patient last week. Stated that he had some concerns.  Tried calling [name] back and had to [facility] to return call.

## 2018-08-01 ENCOUNTER — Ambulatory Visit: Payer: Medicare Other | Attending: Internal Medicine | Admitting: Occupational Therapy

## 2018-08-01 DIAGNOSIS — I890 Lymphedema, not elsewhere classified: Secondary | ICD-10-CM | POA: Diagnosis not present

## 2018-08-01 NOTE — Therapy (Signed)
[place] MAIN [facility] SERVICES [address] [place], [place], [address] Phone: [phone]   Fax:  [phone]  Occupational Therapy Treatment  Patient Details  Name: [name] MRN: [id number] Date of Birth: 1932-05-09 No data recorded  Encounter Date: 08/01/2018  OT End of Session - 08/01/18 1550    Visit Number  4    Number of Visits  36    Date for OT Re-Evaluation  10/20/18    OT Start Time  0908    OT Stop Time  1012    OT Time Calculation (min)  64 min    Activity Tolerance  Patient tolerated treatment well;No increased pain    Behavior During Therapy  WFL for tasks assessed/performed       Past Medical History:  Diagnosis Date  . Anemia, unspecified   . Anxiety   . Arthritis   . Asthma   . Delusions ([place])   . Depression   . Edema 2005   "since put the glass in them"   . GERD (gastroesophageal reflux disease)   . Lumbago   . Lymphedema   . Obstructive chronic bronchitis without exacerbation ([place])   . Other abnormal glucose   . 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 Systolic heart failure (HCC)    EF 35 to 40% per echo January 2015  . Unspecified essential hypertension   . Unspecified nonpsychotic mental disorder   . Unspecified venous (peripheral) insufficiency   . Unspecified venous (peripheral) insufficiency   . Unspecified vitamin D deficiency     Past Surgical History:  Procedure Laterality Date  . APPENDECTOMY    . CERVICAL SPINE SURGERY     x 2  . CHOLECYSTECTOMY    . ESOPHAGOGASTRODUODENOSCOPY N/A 04/05/2015   Procedure: ESOPHAGOGASTRODUODENOSCOPY (EGD);  Surgeon: [name], MD;  Location: [facility];  Service: Endoscopy;  Laterality: N/A;  . FLEXIBLE SIGMOIDOSCOPY N/A 04/05/2015   Procedure: FLEXIBLE SIGMOIDOSCOPY;  Surgeon: [name], MD;  Location: [facility];  Service: Endoscopy;  Laterality: N/A;  . HAND SURGERY    . LUMBAR DISC SURGERY    . PARTIAL HYSTERECTOMY    . TONSILLECTOMY       There were no vitals filed for this visit.  Subjective Assessment - 08/01/18 1547    Subjective   [name] presents for OT visit 4/36 for Intensive Phase CDT to BLE. Pt is accompanied by her newphew, [name]. Pt missed last 2 scheduled OT visits  reportedly due to car problem and illness. Pt denies pain in legs or feet todasay. She pres4nts with wraps in place.    Patient is accompained by:  Family member    Pertinent History  Chronic bronchitis, CVI, anxiety , systoloc hert failure, asthma, arthritis, multiple spinal surgeries w/ chronic back pain    Limitations  Difficulty walking, decreased standing tolerance, impaired transfer ability, decreased ankle AROM, Pain in BLE, Inability to fit street shoes w/ increased falls risk, increased infection risk.    Patient Stated Goals  reduce swelling and legs feel better    Currently in Pain?  Yes   back pain unchanged from initial eval @ 5/10. Pt denies pain in BLE today.   Pain Onset  More than a month ago                   OT Treatments/Exercises (OP) - 08/01/18 0001      ADLs   ADL Education Given  Yes  Manual Therapy   Manual Therapy  Joint mobilization;Manual Lymphatic Drainage (MLD);Compression Bandaging             OT Education - 08/01/18 1550    Education provided  Yes    Education Details  Cont Pt and CG edu for proper application of gradient compression wraps and skin care throughout session    Person(s) Educated  Patient;Caregiver(s)    Methods  Explanation;Demonstration;Handout    Comprehension  Verbalized understanding;Verbal cues required;Returned demonstration          OT Long Term Goals - 07/22/18 1352      OT LONG TERM GOAL #1   Title  Lymphedema (LE) self-care: Pt able to don/doff adjustable, alternative compression garments ( aka CircAif, Francia Greaves) between OT visits with Maximal caregiver assistance within 2 weeks to  facilitate improved prognosis and optimal volume reduction during  Intensive CDT.    Baseline  dependent    Time  2    Period  Weeks    Status  Achieved      OT LONG TERM GOAL #2   Title  Pt to achieve at least 10% BLE limb volume reductions during Intensive CDT to limit LE progression, decrease infection risk, to reduce foot and leg pain, and to improve ability to fit lower body clothing and shoes.    Baseline  dependent    Time  12    Period  Weeks    Status  Partially Met      OT LONG TERM GOAL #3   Title  Pt >/= 85 % compliant with all daily, LE self-care protocols for home program, including simple self-manual lymphatic drainage (MLD), skin care, lymphatic pumping the ex, skin care, and donning/ doffing compression wraps and garments with needed level of caregiver assistance to limit LE progression and further functional decline.      Baseline  dependent    Time  12    Period  Weeks    Status  Partially Met      OT LONG TERM GOAL #4   Title  Lymphedema (LE) self-care : Pt >/= 85 % compliant with all daily LE self-care protocols, including simple self-manual lymphatic drainage (MLD), skin care, lymphatic pumping therex, and donning/ doffing progression garments with needed level of caregiver assistance to limit LE progression, infection risk and further functional decline.      Baseline  dependent    Time  12    Period  Weeks    Status  Partially Met      OT LONG TERM GOAL #5   Title  Pt to tolerate daily compression wraps, garments and devices in keeping w/ prescribed wear regime within 1 week of issue date to progress and retain clinical and functional gains and to limit LE progression.    Baseline  dependent    Time  12    Period  Weeks    Status  Partially Met            Plan - 08/01/18 1551    Clinical Impression Statement  Cont Pt eduy for gradient compression wraps w/ Pt and nephew. Nephew had used extra "figure 8" layers at L ankle causing foot to swell quite a bit since last seen. BLE swelling appears the same as last visit in  terms of decongestion. No additioonal swelling r4duction is observed. Small abrasions on skin are healing, but scatter doplts of blood are noted under R lower leg after session. Provided MLD to RLE and extensive  skin care w/ low pH castor oil to improve hydration and skiin flexibility distally. Provided BLE compression wraps as established.    Occupational performance deficits (Please refer to evaluation for details):  ADL's;IADL's;Rest and Sleep;Play;Leisure;Social Participation;Other   body image   Rehab Potential  Fair    Current Impairments/barriers affecting progress:  Pt presents with decreased prognosis 2/2 overall health status w/ multiple comorbidities and history of non-compliance with previous  lymphedema treatment.    OT Frequency  3x / week    OT Duration  12 weeks   and PRN   OT Treatment/Interventions  Self-care/ADL training;DME and/or AE instruction;Manual lymph drainage;Patient/family education;Compression bandaging;Therapeutic exercise;Therapeutic activities;Manual Therapy;Therapist, nutritional;Other (comment)    Plan  skin care to decrease infection risk and improve hydration    Clinical Decision Making  Multiple treatment options, significant modification of task necessary    Recommended Other Services  fit with adjustable , alternative compression wraps for optimal independence w/ donning and doffing    Consulted and Agree with Plan of Care  Patient;Family member/caregiver       Patient will benefit from skilled therapeutic intervention in order to improve the following deficits and impairments:  Decreased range of motion, Difficulty walking, Impaired flexibility, Increased edema, Decreased activity tolerance, Decreased skin integrity, Decreased balance, Decreased knowledge of use of DME, Decreased scar mobility, Pain, Decreased mobility, Decreased knowledge of precautions, Abnormal gait, Decreased cognition, Decreased endurance, Decreased safety awareness, Decreased  strength, Impaired vision/preception, Impaired perceived functional ability  Visit Diagnosis: Lymphedema, not elsewhere classified    Problem List Patient Active Problem List   Diagnosis Date Noted  . Chronic pain of multiple joints 08/01/2017  . Age-related osteoporosis without current pathological fracture 08/01/2017  . Left knee pain 09/19/2016  . Refractory anemia, unspecified ([place]) 08/10/2016  . Headache 06/22/2015  . Rectal benign neoplasm   . Asthma, chronic 04/04/2015  . history of Tubular adenoma of colon 04/04/2015  . 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 Hypokalemia 04/04/2015  . CKD (chronic kidney disease) stage 3, GFR 30-59 ml/min (HCC) 04/03/2015  . Dehydration 04/03/2015  . Skin ulcer ([place])   . History of colonic polyps 12/08/2014  . Edema 04/14/2014  . Chronic systolic heart failure ([place]) 11/20/2013  . Nausea with vomiting 11/13/2013  . Lymphedema of lower extremity 10/17/2013  . 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 MGUS (monoclonal gammopathy of unknown significance) 05/15/2012  . VITAMIN D DEFICIENCY 01/29/2010  . BURSITIS, LEFT ELBOW 01/29/2010  . DERMATITIS 09/30/2009  . DIABETES MELLITUS, BORDERLINE 01/11/2009  . Paranoid delusion ([place]) 08/02/2008  . Obstructive chronic bronchitis ([place]) 08/02/2008  . Essential hypertension 11/11/2007  . Venous (peripheral) insufficiency 11/11/2007  . RENAL CALCULUS 11/11/2007  . LOW BACK PAIN, CHRONIC 11/11/2007    Andrey Spearman, MS, OTR/L, [facility] 08/01/18 3:56 PM  [place] MAIN [facility] SERVICES [address] [place], [place], [address] Phone: [phone]   Fax:  [phone]  Name: [name] MRN: [id number] Date of Birth: [birth year]

## 2018-08-05 ENCOUNTER — Ambulatory Visit: Payer: Medicare Other | Admitting: Occupational Therapy

## 2018-08-06 ENCOUNTER — Encounter: Payer: Self-pay | Admitting: Occupational Therapy

## 2018-08-07 ENCOUNTER — Ambulatory Visit: Payer: Medicare Other | Admitting: Occupational Therapy

## 2018-08-08 ENCOUNTER — Encounter: Payer: Self-pay | Admitting: Occupational Therapy

## 2018-08-08 ENCOUNTER — Ambulatory Visit: Payer: Medicare Other | Admitting: Occupational Therapy

## 2018-08-12 ENCOUNTER — Ambulatory Visit: Payer: Medicare Other | Admitting: Occupational Therapy

## 2018-08-13 ENCOUNTER — Ambulatory Visit: Payer: Medicare Other | Admitting: Occupational Therapy

## 2018-08-15 ENCOUNTER — Ambulatory Visit: Payer: Medicare Other | Admitting: Occupational Therapy

## 2018-08-16 ENCOUNTER — Ambulatory Visit (INDEPENDENT_AMBULATORY_CARE_PROVIDER_SITE_OTHER): Payer: Medicare Other | Admitting: Internal Medicine

## 2018-08-16 ENCOUNTER — Encounter: Payer: Self-pay | Admitting: Internal Medicine

## 2018-08-16 VITALS — BP 138/80 | HR 74 | Temp 98.4°F | Ht <= 58 in | Wt 146.0 lb

## 2018-08-16 DIAGNOSIS — G8929 Other chronic pain: Secondary | ICD-10-CM

## 2018-08-16 DIAGNOSIS — L309 Dermatitis, unspecified: Secondary | ICD-10-CM | POA: Diagnosis not present

## 2018-08-16 DIAGNOSIS — I890 Lymphedema, not elsewhere classified: Secondary | ICD-10-CM | POA: Diagnosis not present

## 2018-08-16 DIAGNOSIS — Z23 Encounter for immunization: Secondary | ICD-10-CM | POA: Diagnosis not present

## 2018-08-16 DIAGNOSIS — F22 Delusional disorders: Secondary | ICD-10-CM | POA: Diagnosis not present

## 2018-08-16 DIAGNOSIS — M2550 Pain in unspecified joint: Secondary | ICD-10-CM | POA: Diagnosis not present

## 2018-08-16 MED ORDER — CLOTRIMAZOLE-BETAMETHASONE 1-0.05 % EX CREA: 1.0000 "application " | TOPICAL_CREAM | Freq: Two times a day (BID) | CUTANEOUS | 3 refills | Status: DC

## 2018-08-16 MED ORDER — TRAMADOL HCL 50 MG PO TABS: 50.0000 mg | ORAL_TABLET | Freq: Three times a day (TID) | ORAL | 0 refills | Status: DC | PRN

## 2018-08-16 NOTE — Progress Notes (Signed)
Patient ID: [name], female   DOB: [birth year], 84 y.o.   MRN: [id number]   Location:  [facility] OFFICE  Provider: DR Arletha Grippe  Code Status:  Goals of Care:  Advanced Directives 07/23/2018  Does Patient Have a Medical Advance Directive? Yes  Type of Paramedic of [place];Living will  Does patient want to make changes to medical advance directive? -  Copy of [place] in Chart? -  Would patient like information on creating a medical advance directive? -     Chief Complaint  Patient presents with  . Medical Management of Chronic Issues    46mth follow-up    HPI: Patient is a 84 y.o. female seen today for medical management of chronic diseases.  She is followed by OT for LE lymphedema. Drainage form legs have stopped. She is a poor historian due to psychosis. Hx obtained from chart.  Chronic LBP/generalized joint pain  - controlled on tramadol and applies topical agents. She has noticed increased swelling in L>R wrist. 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 DXA 06/2017 revealed osteoporosis with T score -2.6. She is taking Ca w D 1200mg  daily and Prolia injections  Chronic systolic HF - stable on coreg, torsemide + potassium and hydralazine. Weight up 2 lbs since Feb 2019. Followed by cardio Dr Daneen Schick. EF 55-60% (EF 35-40% by 2D echo in 2015). Prior echo also revealed septal wall akinesis/severe hypokinesis; mod-sev diffuse hypokinesis.  Peripheral neuropathy - overall controlled on gabapentin. She has tingling in hands.Followed by neurology Dr Tomi Likens  Venous insufficiency/chronic LE lymphedema - improved lymphedema; she is followed by outpatient rehab OT; stable on torsemide, clotrimazole/betamethasone cream, silvadene cream. She does not use her TED stockings consistently.   Psychosis/ hallucinations - unchanged. no new behavioral disturbances and no formal psych evaluation. Family not willing for counseling  at this time. She lives alone and gets meals on wheels. family stays with her at night most days of the week and she gets some PCS services. Per [name], she is taking zyprexa 5mg  qhs  Past Medical History:  Diagnosis Date  . Anemia, unspecified   . Anxiety   . Arthritis   . Asthma   . Delusions ([place])   . Depression   . Edema 2005   "since put the glass in them"   . GERD (gastroesophageal reflux disease)   . Lumbago   . Lymphedema   . Obstructive chronic bronchitis without exacerbation ([place])   . Other abnormal glucose   . 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[place]) 04/04/2013  . Systolic heart failure (HCC)    EF 35 to 40% per echo January 2015  . Unspecified essential hypertension   . Unspecified nonpsychotic mental disorder   . Unspecified venous (peripheral) insufficiency   . Unspecified venous (peripheral) insufficiency   . Unspecified vitamin D deficiency     Past Surgical History:  Procedure Laterality Date  . APPENDECTOMY    . CERVICAL SPINE SURGERY     x 2  . CHOLECYSTECTOMY    . ESOPHAGOGASTRODUODENOSCOPY N/A 04/05/2015   Procedure: ESOPHAGOGASTRODUODENOSCOPY (EGD);  Surgeon: [name], MD;  Location: [facility];  Service: Endoscopy;  Laterality: N/A;  . FLEXIBLE SIGMOIDOSCOPY N/A 04/05/2015   Procedure: FLEXIBLE SIGMOIDOSCOPY;  Surgeon: [name], MD;  Location: [facility];  Service: Endoscopy;  Laterality: N/A;  . HAND SURGERY    . LUMBAR DISC SURGERY    . PARTIAL HYSTERECTOMY    . TONSILLECTOMY  reports that she has never smoked. She has never used smokeless tobacco. She reports that she does not drink alcohol or use drugs. Social History   Socioeconomic History  . Marital status: Widowed    Spouse name: Not on file  . Number of children: 0  . Years of education: Not on file  . Highest education level: Not on file  Occupational History  . Occupation: retired  Scientific laboratory technician  . Financial resource strain: Not hard at all  . Food insecurity:    Worry: Never  true    Inability: Never true  . Transportation needs:    Medical: No    Non-medical: No  Tobacco Use  . Smoking status: Never Smoker  . Smokeless tobacco: Never Used  Substance and Sexual Activity  . Alcohol use: No  . Drug use: No  . Sexual activity: Not Currently  Lifestyle  . Physical activity:    Days per week: 7 days    Minutes per session: 10 min  . Stress: Not at all  Relationships  . Social connections:    Talks on phone: More than three times a week    Gets together: More than three times a week    Attends religious service: 1 to 4 times per year    Active member of club or organization: No    Attends meetings of clubs or organizations: Never    Relationship status: Widowed  . Intimate partner violence:    Fear of current or ex partner: No    Emotionally abused: No    Physically abused: No    Forced sexual activity: No  Other Topics Concern  . Not on file  Social History Narrative   Diet? Good, low appetite      Do you drink/eat things with caffeine? no      Marital status?                  widowed                  What year were you married?      Do you live in a house, apartment, assisted living, condo, trailer, etc.? house      Is it one or more stories? 1 story      How many persons live in your home? Only me      Do you have any pets in your home? (please list) no      Current or past profession: lab tech      Do you exercise?        some                              Type & how often? Foot rolls, some walking      Do you have a living will? yes      Do you have a DNR form?    no                              If not, do you want to discuss one?      Do you have signed POA/HPOA for forms? yes    Family History  Problem Relation Age of Onset  . Diabetes Mother   . Arthritis Father   . Diabetes Paternal Grandfather        entire family on both sides  . Heart  disease Maternal Aunt        entire family of both sides  . Cancer Paternal Aunt         type unknown  . Diabetes Sister   . Breast cancer Cousin     Allergies  Allergen Reactions  . Aspirin Other (See Comments)    REACTION: upset stomach  . Codeine Other (See Comments)    REACTION: "MAKES MY BODY GO CRAZY; CRAMPS"  . Latex Nausea And Vomiting    Outpatient Encounter Medications as of 08/16/2018  Medication Sig  . acetaminophen (TYLENOL 8 HOUR) 650 MG CR tablet Take 1 tablet (650 mg total) by mouth 3 (three) times daily with meals.  [name] albuterol (PROAIR HFA) 108 (90 Base) MCG/ACT inhaler INHALE 2 PUFFS EVERY 4 HOURS AS NEEDED FOR WHEEZING  . ALPRAZolam (XANAX) 0.5 MG tablet Take 1/2 to 1 tablet by mouth three times daily as needed for nerves  . Ascorbic Acid (VITAMIN C) 1000 MG tablet 2 by mouth twice daily before lunch and supper   . calcium-vitamin D (OSCAL WITH D) 500-200 MG-UNIT tablet Take 1 tablet by mouth daily with breakfast.  . carvedilol (COREG) 6.25 MG tablet Take 1 tablet twice daily in lunch and supper.  . clotrimazole-betamethasone (LOTRISONE) cream Apply 1 application topically 2 (two) times daily. To your legs  . Ferrous Sulfate Dried 200 (65 Fe) MG TABS Take 1 tablet by mouth 2 (two) times daily before lunch and supper.  . fluticasone-salmeterol (ADVAIR HFA) 115-21 MCG/ACT inhaler USE 2 INHALATIONS TWICE A DAY  . gabapentin (NEURONTIN) 100 MG capsule TAKE 2 CAPSULES IN THE MORNING AND 2 CAPSULES AT BEDTIME  . hydrALAZINE (APRESOLINE) 25 MG tablet Take 1 tablet (25 mg total) by mouth 2 (two) times daily at 8 am and 10 pm.  . OLANZapine (ZYPREXA) 5 MG tablet Take 1 tablet (5 mg total) by mouth at bedtime.  . potassium chloride SA (K-DUR,KLOR-CON) 20 MEQ tablet Take 2 tablets (40 mEq total) by mouth 2 (two) times daily before lunch and supper.  . ranitidine (ZANTAC) 150 MG tablet Take 1 tablet (150 mg total) by mouth at bedtime.  . theophylline (THEO-24) 200 MG 24 hr capsule Take 1 capsule (200 mg total) by mouth every morning.  . torsemide (DEMADEX) 20 MG  tablet Take 3 tablets (60 mg total) by mouth 2 (two) times daily at 8 am and 10 pm.  . traMADol (ULTRAM) 50 MG tablet Take 1 tablet (50 mg total) by mouth 3 (three) times daily as needed.  . Vitamin D, Ergocalciferol, (DRISDOL) 50000 units CAPS capsule Take 50,000 Units every Monday by mouth.   . [DISCONTINUED] doxycycline (VIBRA-TABS) 100 MG tablet Take 1 tablet (100 mg total) by mouth 2 (two) times daily. FOR SKIN INFECTION START ON SATURDAY 8/17TH  . [DISCONTINUED] amoxicillin (AMOXIL) 500 MG capsule Take 1 capsule (500 mg total) by mouth 2 (two) times daily. FOR SKIN INFECTION; START ON SATURDAY 8/17TH  . [DISCONTINUED] saccharomyces boulardii (FLORASTOR) 250 MG capsule Take 1 capsule (250 mg total) by mouth 2 (two) times daily. TO KEEP COLON HEALTHY  . [DISCONTINUED] silver sulfADIAZINE (SILVADENE) 1 % cream Apply 1 application topically daily as needed (open wound).  . [DISCONTINUED] Triamcinolone Acetonide (TRIAMCINOLONE 0.1 % CREAM : EUCERIN) CREA Apply 1 application 2 (two) times daily topically. Apply to the rash on arms and upper part of your lower legs   No facility-administered encounter medications on file as of 08/16/2018.     Review of Systems:  Review of Systems  Unable to perform ROS: Psychiatric disorder    Health Maintenance  Topic Date Due  . COLONOSCOPY  08/25/2015  . PNA vac Low Risk Adult (2 of 2 - PPSV23) 11/24/2017  . INFLUENZA VACCINE  05/30/2018  . TETANUS/TDAP  04/06/2025  . DEXA SCAN  Completed    Physical Exam: Vitals:   08/16/18 1220  BP: 138/80  Pulse: 74  Temp: 98.4 F (36.9 C)  TempSrc: Oral  SpO2: 98%  Weight: 146 lb (66.2 kg)  Height: 4\' 10"  (1.473 m)   Body mass index is 30.51 kg/m. Physical Exam  Constitutional: She appears well-developed.  HENT:  Mouth/Throat: Oropharynx is clear and moist. No oropharyngeal exudate.  MMM; no oral thrush  Eyes: Pupils are equal, round, and reactive to light. No scleral icterus.  Neck: Neck supple.  Carotid bruit is not present. No tracheal deviation present. No thyromegaly present.  Cardiovascular: Normal rate, regular rhythm and intact distal pulses. Exam reveals no gallop and no friction rub.  Murmur (1/6 SEM) heard. L>RLE lymphedema with chronic venous stasis changes; no weeping; no calf TTP b/l  Pulmonary/Chest: Effort normal and breath sounds normal. No stridor. No respiratory distress. She has no wheezes. She has no rales.  Abdominal: Soft. Normal appearance and bowel sounds are normal. She exhibits no distension and no mass. There is no hepatomegaly. There is no tenderness. There is no rigidity, no rebound and no guarding. No hernia.  Musculoskeletal: She exhibits edema (small and large joints) and tenderness.  Lymphadenopathy:    She has no cervical adenopathy.  Neurological: She is alert. She has normal reflexes.  Skin: Skin is warm and dry. Rash (eczematous rash right base of neck, b/l UE; no secondary signs of infection; macerated) noted.  Psychiatric: She has a normal mood and affect. Her behavior is normal. She is not actively hallucinating. Thought content is delusional.    Labs reviewed: Basic Metabolic Panel: Recent Labs    09/04/17 1558 02/12/18 1511 06/14/18 1210  NA 138 137 139  K 4.2 4.2 3.6  CL 109 103 99  CO2 23 25 30   GLUCOSE 77 90 118  BUN 11 17 26*  CREATININE 0.99 1.70* 1.46*  CALCIUM 9.1 9.7 10.1   Liver Function Tests: Recent Labs    09/04/17 1558 02/12/18 1511  AST 21 19  ALT 12* 8  ALKPHOS 52  --   BILITOT 0.4 0.4  PROT 8.1 8.8*  ALBUMIN 3.1*  --    No results for input(s): LIPASE, AMYLASE in the last 8760 hours. No results for input(s): AMMONIA in the last 8760 hours. CBC: Recent Labs    09/04/17 1558 02/12/18 1511 06/14/18 1210  WBC 5.2 7.6 6.9  NEUTROABS  --  5,388 5,382  HGB 8.9* 9.3* 10.4*  HCT 28.9* 28.8* 33.3*  MCV 85.0 82.3 86.3  PLT 253 377 247   Lipid Panel: No results for input(s): CHOL, HDL, LDLCALC, TRIG,  CHOLHDL, LDLDIRECT in the last 8760 hours. Lab Results  Component Value Date   HGBA1C 5.5 04/14/2014    Procedures since last visit: No results found.  Assessment/Plan   ICD-10-CM   1. Eczema, unspecified type L30.9   2. Lymphedema of both lower extremities I89.0 Ambulatory referral to [facility]    clotrimazole-betamethasone (LOTRISONE) cream  3. Paranoid delusion ([place]) [place] Ambulatory referral to [facility]  4. Chronic pain of multiple joints M25.50 Ambulatory referral to [facility]   G89.29 traMADol (ULTRAM) 50 MG tablet  5. Need  for influenza vaccination Z23 Flu Vaccine QUAD 6+ mos PF IM (Fluarix Quad PF)  6. Need for 23-polyvalent pneumococcal polysaccharide vaccine Z23 Pneumococcal polysaccharide vaccine 23-valent greater than or equal to 2yo subcutaneous/IM   START LOTRISONE CREAM 2 TIMES DAILY TO ARMS AND LEGS AS ORDERED  Continue other medications as ordered  Follow up with OT as scheduled for lymphedema  Flu shot and pneumovax given today  PLEASE SCHEDULE NURSE ROOM VISIT FOR PROLIA INJECTION IN December 2019.  Follow up in 3 mos with Janett Billow for routine visit   Corinthia Helmers S. Perlie Gold  [facility] [address] [place], [place] [address] [phone] Cell (Monday-Friday 8 AM - 5 PM) [phone] After 5 PM and follow prompts

## 2018-08-16 NOTE — Patient Instructions (Addendum)
START LOTRISONE CREAM 2 TIMES DAILY TO ARMS AND LEGS AS ORDERED  Continue other medications as ordered  Follow up with OT as scheduled for lymphedema  Flu shot and pneumovax given today  PLEASE SCHEDULE NURSE ROOM VISIT FOR PROLIA INJECTION IN December 2019.  Follow up in 3 mos with Janett Billow for routine visit

## 2018-08-19 ENCOUNTER — Ambulatory Visit: Payer: Medicare Other | Admitting: Occupational Therapy

## 2018-08-19 DIAGNOSIS — I890 Lymphedema, not elsewhere classified: Secondary | ICD-10-CM

## 2018-08-19 NOTE — Therapy (Signed)
[place] MAIN [facility] SERVICES [address] [place], [place], [address] Phone: [phone]   Fax:  [phone]  Occupational Therapy Treatment  Patient Details  Name: [name] MRN: [id number] Date of Birth: [birth year] No data recorded  Encounter Date: 08/19/2018  OT End of Session - 08/19/18 1423    Visit Number  5    Number of Visits  36    Date for OT Re-Evaluation  10/20/18    OT Start Time  0900    OT Stop Time  1015    OT Time Calculation (min)  75 min    Activity Tolerance  Patient tolerated treatment well;No increased pain    Behavior During Therapy  WFL for tasks assessed/performed       Past Medical History:  Diagnosis Date  . Anemia, unspecified   . Anxiety   . Arthritis   . Asthma   . Delusions ([place])   . Depression   . Edema 2005   "since put the glass in them"   . GERD (gastroesophageal reflux disease)   . Lumbago   . Lymphedema   . Obstructive chronic bronchitis without exacerbation ([place])   . Other abnormal glucose   . 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 Systolic heart failure (HCC)    EF 35 to 40% per echo January 2015  . Unspecified essential hypertension   . Unspecified nonpsychotic mental disorder   . Unspecified venous (peripheral) insufficiency   . Unspecified venous (peripheral) insufficiency   . Unspecified vitamin D deficiency     Past Surgical History:  Procedure Laterality Date  . APPENDECTOMY    . CERVICAL SPINE SURGERY     x 2  . CHOLECYSTECTOMY    . ESOPHAGOGASTRODUODENOSCOPY N/A 04/05/2015   Procedure: ESOPHAGOGASTRODUODENOSCOPY (EGD);  Surgeon: [name], MD;  Location: [facility];  Service: Endoscopy;  Laterality: N/A;  . FLEXIBLE SIGMOIDOSCOPY N/A 04/05/2015   Procedure: FLEXIBLE SIGMOIDOSCOPY;  Surgeon: [name], MD;  Location: [facility];  Service: Endoscopy;  Laterality: N/A;  . HAND SURGERY    . LUMBAR DISC SURGERY    . PARTIAL HYSTERECTOMY    . TONSILLECTOMY       There were no vitals filed for this visit.  Subjective Assessment - 08/19/18 0912    Subjective   [name] presents for OT visit 5/36 for Intensive Phase CDT to BLE. Pt is accompanied by her newphew, [name].. Pt was last seen on 08/01/18. She missed several scheduled visits due to illness. Pt brings compression wraps to clinic. Legs have not been wrapped sionce last seen.    Patient is accompained by:  Family member    Pertinent History  Chronic bronchitis, CVI, anxiety , systoloc hert failure, asthma, arthritis, multiple spinal surgeries w/ chronic back pain    Limitations  Difficulty walking, decreased standing tolerance, impaired transfer ability, decreased ankle AROM, Pain in BLE, Inability to fit street shoes w/ increased falls risk, increased infection risk.    Patient Stated Goals  reduce swelling and legs feel better    Currently in Pain?  No/denies    Pain Onset  More than a month ago                   OT Treatments/Exercises (OP) - 08/19/18 0001      ADLs   ADL Education Given  Yes      Manual Therapy   Manual Therapy  Edema management;Manual Lymphatic Drainage (MLD);Compression Bandaging  Manual therapy comments  skin care to RLE below the knees using Eucerin low ph lotion to improve hydration.    Manual Lymphatic Drainage (MLD)  MLD to LLE utilizing short neck sequence, deep abdominals and functional inguinaol pathway.    Compression Bandaging  multi layer compression wraps reapplied as established- 4 layers below the knee to the LLE and 3 layers  and 3 layers below knee to RLE.             OT Education - 08/19/18 1422    Education provided  Yes    Education Details  Continued skilled Pt/caregiver education  And LE ADL training throughout visit for lymphedema self care/ home program, including compression wrapping, compression garment and device wear/care, lymphatic pumping ther ex, simple self-MLD, and skin care. Discussed progress towards goals     Person(s) Educated  Patient;Caregiver(s)    Methods  Explanation;Demonstration;Handout    Comprehension  Verbalized understanding;Verbal cues required;Returned demonstration          OT Long Term Goals - 07/22/18 1352      OT LONG TERM GOAL #1   Title  Lymphedema (LE) self-care: Pt able to don/doff adjustable, alternative compression garments ( aka CircAif, Francia Greaves) between OT visits with Maximal caregiver assistance within 2 weeks to  facilitate improved prognosis and optimal volume reduction during Intensive CDT.    Baseline  dependent    Time  2    Period  Weeks    Status  Achieved      OT LONG TERM GOAL #2   Title  Pt to achieve at least 10% BLE limb volume reductions during Intensive CDT to limit LE progression, decrease infection risk, to reduce foot and leg pain, and to improve ability to fit lower body clothing and shoes.    Baseline  dependent    Time  12    Period  Weeks    Status  Partially Met      OT LONG TERM GOAL #3   Title  Pt >/= 85 % compliant with all daily, LE self-care protocols for home program, including simple self-manual lymphatic drainage (MLD), skin care, lymphatic pumping the ex, skin care, and donning/ doffing compression wraps and garments with needed level of caregiver assistance to limit LE progression and further functional decline.      Baseline  dependent    Time  12    Period  Weeks    Status  Partially Met      OT LONG TERM GOAL #4   Title  Lymphedema (LE) self-care : Pt >/= 85 % compliant with all daily LE self-care protocols, including simple self-manual lymphatic drainage (MLD), skin care, lymphatic pumping therex, and donning/ doffing progression garments with needed level of caregiver assistance to limit LE progression, infection risk and further functional decline.      Baseline  dependent    Time  12    Period  Weeks    Status  Partially Met      OT LONG TERM GOAL #5   Title  Pt to tolerate daily compression wraps, garments and  devices in keeping w/ prescribed wear regime within 1 week of issue date to progress and retain clinical and functional gains and to limit LE progression.    Baseline  dependent    Time  12    Period  Weeks    Status  Partially Met            Plan - 08/19/18 1423  Clinical Impression Statement  Leg swelling and skin condition are unchanged since initally commencing OT for CDT. Pt unable to apply compression wraps between visits and CG are inconmsistent . Skin care is also inconsistent and limited during rx intervals. Emphasis of manual therapy was on LLE today, including MLD, skin care and gradient compression wraps. RLE wraps also applied. Cont as per POC. Prognosis declining with inconsistent family care giving between OT visits.    Occupational performance deficits (Please refer to evaluation for details):  ADL's;IADL's;Rest and Sleep;Play;Leisure;Social Participation;Other   body image   Rehab Potential  Fair    Current Impairments/barriers affecting progress:  Pt presents with decreased prognosis 2/2 overall health status w/ multiple comorbidities and history of non-compliance with previous  lymphedema treatment.    OT Frequency  3x / week    OT Duration  12 weeks   and PRN   OT Treatment/Interventions  Self-care/ADL training;DME and/or AE instruction;Manual lymph drainage;Patient/family education;Compression bandaging;Therapeutic exercise;Therapeutic activities;Manual Therapy;Therapist, nutritional;Other (comment)    Plan  skin care to decrease infection risk and improve hydration    Clinical Decision Making  Multiple treatment options, significant modification of task necessary    Recommended Other Services  fit with adjustable , alternative compression wraps for optimal independence w/ donning and doffing    Consulted and Agree with Plan of Care  Patient;Family member/caregiver       Patient will benefit from skilled therapeutic intervention in order to improve the  following deficits and impairments:  Decreased range of motion, Difficulty walking, Impaired flexibility, Increased edema, Decreased activity tolerance, Decreased skin integrity, Decreased balance, Decreased knowledge of use of DME, Decreased scar mobility, Pain, Decreased mobility, Decreased knowledge of precautions, Abnormal gait, Decreased cognition, Decreased endurance, Decreased safety awareness, Decreased strength, Impaired vision/preception, Impaired perceived functional ability  Visit Diagnosis: Lymphedema, not elsewhere classified    Problem List Patient Active Problem List   Diagnosis Date Noted  . Chronic pain of multiple joints 08/01/2017  . Age-related osteoporosis without current pathological fracture 08/01/2017  . Left knee pain 09/19/2016  . Refractory anemia, unspecified ([place]) 08/10/2016  . Headache 06/22/2015  . Rectal benign neoplasm   . Asthma, chronic 04/04/2015  . history of Tubular adenoma of colon 04/04/2015  . 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 Hypokalemia 04/04/2015  . CKD (chronic kidney disease) stage 3, GFR 30-59 ml/min (HCC) 04/03/2015  . Dehydration 04/03/2015  . Skin ulcer ([place])   . History of colonic polyps 12/08/2014  . Edema 04/14/2014  . Chronic systolic heart failure ([place]) 11/20/2013  . Nausea with vomiting 11/13/2013  . Lymphedema of lower extremity 10/17/2013  . 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[place]) 04/04/2013  . MGUS (monoclonal gammopathy of unknown significance) 05/15/2012  . VITAMIN D DEFICIENCY 01/29/2010  . BURSITIS, LEFT ELBOW 01/29/2010  . DERMATITIS 09/30/2009  . DIABETES MELLITUS, BORDERLINE 01/11/2009  . Paranoid delusion ([place]) 08/02/2008  . Obstructive chronic bronchitis ([place]) 08/02/2008  . Essential hypertension 11/11/2007  . Venous (peripheral) insufficiency 11/11/2007  . RENAL CALCULUS 11/11/2007  . LOW BACK PAIN, CHRONIC 11/11/2007   Andrey Spearman, MS, OTR/L, [facility] 08/19/18 2:27 PM    [place] MAIN [facility] SERVICES [address] [place], [place], [address] Phone: [phone]   Fax:  [phone]  Name: [name] MRN: [id number] Date of Birth: 06-05-1932

## 2018-08-20 ENCOUNTER — Ambulatory Visit: Payer: Medicare Other | Admitting: Occupational Therapy

## 2018-08-22 ENCOUNTER — Ambulatory Visit: Payer: Medicare Other | Admitting: Occupational Therapy

## 2018-08-22 DIAGNOSIS — I890 Lymphedema, not elsewhere classified: Secondary | ICD-10-CM | POA: Diagnosis not present

## 2018-08-22 NOTE — Therapy (Signed)
[place] MAIN [facility] SERVICES [address] [place], [place], [address] Phone: [phone]   Fax:  [phone]  Occupational Therapy Treatment  Patient Details  Name: [name] MRN: [id number] Date of Birth: [birth year] No data recorded  Encounter Date: 08/22/2018  OT End of Session - 08/22/18 1635    Visit Number  6    Number of Visits  36    Date for OT Re-Evaluation  10/20/18    OT Start Time  0922    OT Stop Time  1005    OT Time Calculation (min)  43 min    Activity Tolerance  Patient tolerated treatment well;No increased pain    Behavior During Therapy  WFL for tasks assessed/performed       Past Medical History:  Diagnosis Date  . Anemia, unspecified   . Anxiety   . Arthritis   . Asthma   . Delusions ([place])   . Depression   . Edema 2005   "since put the glass in them"   . GERD (gastroesophageal reflux disease)   . Lumbago   . Lymphedema   . Obstructive chronic bronchitis without exacerbation ([place])   . Other abnormal glucose   . 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 Systolic heart failure (HCC)    EF 35 to 40% per echo January 2015  . Unspecified essential hypertension   . Unspecified nonpsychotic mental disorder   . Unspecified venous (peripheral) insufficiency   . Unspecified venous (peripheral) insufficiency   . Unspecified vitamin D deficiency     Past Surgical History:  Procedure Laterality Date  . APPENDECTOMY    . CERVICAL SPINE SURGERY     x 2  . CHOLECYSTECTOMY    . ESOPHAGOGASTRODUODENOSCOPY N/A 04/05/2015   Procedure: ESOPHAGOGASTRODUODENOSCOPY (EGD);  Surgeon: [name], MD;  Location: [facility];  Service: Endoscopy;  Laterality: N/A;  . FLEXIBLE SIGMOIDOSCOPY N/A 04/05/2015   Procedure: FLEXIBLE SIGMOIDOSCOPY;  Surgeon: [name], MD;  Location: [facility];  Service: Endoscopy;  Laterality: N/A;  . HAND SURGERY    . LUMBAR DISC SURGERY    . PARTIAL HYSTERECTOMY    . TONSILLECTOMY       There were no vitals filed for this visit.  Subjective Assessment - 08/22/18 1633    Subjective   [name] presents for OT visit 6/36 for Intensive Phase CDT to BLE. Pt is accompanied by her newphew, [name]. Pt missed last visit w/ no reason given. Pt is 22 minutes late for OT today, so session was abbreviated due to time constraints.    Patient is accompained by:  Family member    Pertinent History  Chronic bronchitis, CVI, anxiety , systoloc hert failure, asthma, arthritis, multiple spinal surgeries w/ chronic back pain    Limitations  Difficulty walking, decreased standing tolerance, impaired transfer ability, decreased ankle AROM, Pain in BLE, Inability to fit street shoes w/ increased falls risk, increased infection risk.    Patient Stated Goals  reduce swelling and legs feel better    Currently in Pain?  Yes   leg and back pain unchanged since initial evaluation. not rated numericqally today   Pain Onset  More than a month ago                   OT Treatments/Exercises (OP) - 08/22/18 0001      ADLs   ADL Education Given  Yes      Manual Therapy   Manual Therapy  Edema management;Manual Lymphatic Drainage (MLD);Compression Bandaging    Manual therapy comments  skin care to RLE below the knees using Eucerin low ph lotion to improve hydration.    Manual Lymphatic Drainage (MLD)  MLD to LLE utilizing short neck sequence, deep abdominals and functional inguinaol pathway.    Compression Bandaging  multi layer compression wraps reapplied as established- 4 layers below the knee to the LLE and 3 layers  and 3 layers below knee to RLE.             OT Education - 08/22/18 1635    Education provided  Yes    Education Details  Continued skilled Pt/caregiver education  And LE ADL training throughout visit for lymphedema self care/ home program, including compression wrapping, compression garment and device wear/care, lymphatic pumping ther ex, simple self-MLD, and skin  care. Discussed progress towards goals    Person(s) Educated  Patient;Caregiver(s)    Methods  Explanation;Demonstration;Handout    Comprehension  Verbalized understanding;Verbal cues required;Returned demonstration          OT Long Term Goals - 07/22/18 1352      OT LONG TERM GOAL #1   Title  Lymphedema (LE) self-care: Pt able to don/doff adjustable, alternative compression garments ( aka CircAif, Francia Greaves) between OT visits with Maximal caregiver assistance within 2 weeks to  facilitate improved prognosis and optimal volume reduction during Intensive CDT.    Baseline  dependent    Time  2    Period  Weeks    Status  Achieved      OT LONG TERM GOAL #2   Title  Pt to achieve at least 10% BLE limb volume reductions during Intensive CDT to limit LE progression, decrease infection risk, to reduce foot and leg pain, and to improve ability to fit lower body clothing and shoes.    Baseline  dependent    Time  12    Period  Weeks    Status  Partially Met      OT LONG TERM GOAL #3   Title  Pt >/= 85 % compliant with all daily, LE self-care protocols for home program, including simple self-manual lymphatic drainage (MLD), skin care, lymphatic pumping the ex, skin care, and donning/ doffing compression wraps and garments with needed level of caregiver assistance to limit LE progression and further functional decline.      Baseline  dependent    Time  12    Period  Weeks    Status  Partially Met      OT LONG TERM GOAL #4   Title  Lymphedema (LE) self-care : Pt >/= 85 % compliant with all daily LE self-care protocols, including simple self-manual lymphatic drainage (MLD), skin care, lymphatic pumping therex, and donning/ doffing progression garments with needed level of caregiver assistance to limit LE progression, infection risk and further functional decline.      Baseline  dependent    Time  12    Period  Weeks    Status  Partially Met      OT LONG TERM GOAL #5   Title  Pt to  tolerate daily compression wraps, garments and devices in keeping w/ prescribed wear regime within 1 week of issue date to progress and retain clinical and functional gains and to limit LE progression.    Baseline  dependent    Time  12    Period  Weeks    Status  Partially Met  Plan - 08/22/18 1636    Clinical Impression Statement  RLE presents with  multiple areas below the knee where skin is dry and scaley and sptting blood droplets. Universal prcautions used throughout session. MLE to LLE only today in effort to limit excess sheer force and tension on fragile skin of RLE. BLE compression wraps after MLD. Cont as per OPC.    Occupational performance deficits (Please refer to evaluation for details):  ADL's;IADL's;Rest and Sleep;Play;Leisure;Social Participation;Other   body image   Rehab Potential  Fair    Current Impairments/barriers affecting progress:  Pt presents with decreased prognosis 2/2 overall health status w/ multiple comorbidities and history of non-compliance with previous  lymphedema treatment.    OT Frequency  3x / week    OT Duration  12 weeks   and PRN   OT Treatment/Interventions  Self-care/ADL training;DME and/or AE instruction;Manual lymph drainage;Patient/family education;Compression bandaging;Therapeutic exercise;Therapeutic activities;Manual Therapy;Therapist, nutritional;Other (comment)    Plan  skin care to decrease infection risk and improve hydration    Clinical Decision Making  Multiple treatment options, significant modification of task necessary    Recommended Other Services  fit with adjustable , alternative compression wraps for optimal independence w/ donning and doffing    Consulted and Agree with Plan of Care  Patient;Family member/caregiver       Patient will benefit from skilled therapeutic intervention in order to improve the following deficits and impairments:  Decreased range of motion, Difficulty walking, Impaired flexibility,  Increased edema, Decreased activity tolerance, Decreased skin integrity, Decreased balance, Decreased knowledge of use of DME, Decreased scar mobility, Pain, Decreased mobility, Decreased knowledge of precautions, Abnormal gait, Decreased cognition, Decreased endurance, Decreased safety awareness, Decreased strength, Impaired vision/preception, Impaired perceived functional ability  Visit Diagnosis: Lymphedema, not elsewhere classified    Problem List Patient Active Problem List   Diagnosis Date Noted  . Chronic pain of multiple joints 08/01/2017  . Age-related osteoporosis without current pathological fracture 08/01/2017  . Left knee pain 09/19/2016  . Refractory anemia, unspecified ([place]) 08/10/2016  . Headache 06/22/2015  . Rectal benign neoplasm   . Asthma, chronic 04/04/2015  . history of Tubular adenoma of colon 04/04/2015  . 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 Hypokalemia 04/04/2015  . CKD (chronic kidney disease) stage 3, GFR 30-59 ml/min (HCC) 04/03/2015  . Dehydration 04/03/2015  . Skin ulcer ([place])   . History of colonic polyps 12/08/2014  . Edema 04/14/2014  . Chronic systolic heart failure ([place]) 11/20/2013  . Nausea with vomiting 11/13/2013  . Lymphedema of lower extremity 10/17/2013  . 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 MGUS (monoclonal gammopathy of unknown significance) 05/15/2012  . VITAMIN D DEFICIENCY 01/29/2010  . BURSITIS, LEFT ELBOW 01/29/2010  . DERMATITIS 09/30/2009  . DIABETES MELLITUS, BORDERLINE 01/11/2009  . Paranoid delusion ([place]) 08/02/2008  . Obstructive chronic bronchitis ([place]) 08/02/2008  . Essential hypertension 11/11/2007  . Venous (peripheral) insufficiency 11/11/2007  . RENAL CALCULUS 11/11/2007  . LOW BACK PAIN, CHRONIC 11/11/2007    Andrey Spearman, MS, OTR/L, [facility] 08/22/18 4:39 PM  [place] MAIN [facility] SERVICES [address] [place], [place], [address] Phone: [phone]    Fax:  [phone]  Name: MORELIA CASSELLS MRN: [id number] Date of Birth: 07/17/1932

## 2018-08-26 ENCOUNTER — Ambulatory Visit: Payer: Medicare Other | Admitting: Occupational Therapy

## 2018-08-27 ENCOUNTER — Ambulatory Visit: Payer: Medicare Other | Admitting: Occupational Therapy

## 2018-08-27 DIAGNOSIS — I890 Lymphedema, not elsewhere classified: Secondary | ICD-10-CM | POA: Diagnosis not present

## 2018-08-27 NOTE — Therapy (Signed)
[place] MAIN [facility] SERVICES [address] [place], [place], [address] Phone: [phone]   Fax:  [phone]  Occupational Therapy Treatment  Patient Details  Name: [name] MRN: [id number] Date of Birth: 04/30/1932 No data recorded  Encounter Date: 08/27/2018  OT End of Session - 08/27/18 0931    Visit Number  6    Number of Visits  36    Date for OT Re-Evaluation  10/20/18    OT Start Time  0915    OT Stop Time  1015    OT Time Calculation (min)  60 min    Activity Tolerance  Patient tolerated treatment well;No increased pain    Behavior During Therapy  WFL for tasks assessed/performed       Past Medical History:  Diagnosis Date  . Anemia, unspecified   . Anxiety   . Arthritis   . Asthma   . Delusions ([place])   . Depression   . Edema 2005   "since put the glass in them"   . GERD (gastroesophageal reflux disease)   . Lumbago   . Lymphedema   . Obstructive chronic bronchitis without exacerbation ([place])   . Other abnormal glucose   . 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 Systolic heart failure (HCC)    EF 35 to 40% per echo January 2015  . Unspecified essential hypertension   . Unspecified nonpsychotic mental disorder   . Unspecified venous (peripheral) insufficiency   . Unspecified venous (peripheral) insufficiency   . Unspecified vitamin D deficiency     Past Surgical History:  Procedure Laterality Date  . APPENDECTOMY    . CERVICAL SPINE SURGERY     x 2  . CHOLECYSTECTOMY    . ESOPHAGOGASTRODUODENOSCOPY N/A 04/05/2015   Procedure: ESOPHAGOGASTRODUODENOSCOPY (EGD);  Surgeon: [name], MD;  Location: [facility];  Service: Endoscopy;  Laterality: N/A;  . FLEXIBLE SIGMOIDOSCOPY N/A 04/05/2015   Procedure: FLEXIBLE SIGMOIDOSCOPY;  Surgeon: [name], MD;  Location: [facility];  Service: Endoscopy;  Laterality: N/A;  . HAND SURGERY    . LUMBAR DISC SURGERY    . PARTIAL HYSTERECTOMY    . TONSILLECTOMY       There were no vitals filed for this visit.  Subjective Assessment - 08/27/18 1602    Subjective   Mrs. Mazurkiewicz presents for OT visit 7/36 for Intensive Phase CDT to BLE. Pt is accompanied by her newphew, [name]. Pt is not wearing compression to clinic. She brings clean wraps with her today.    Patient is accompained by:  Family member    Pertinent History  Chronic bronchitis, CVI, anxiety , systoloc hert failure, asthma, arthritis, multiple spinal surgeries w/ chronic back pain    Limitations  Difficulty walking, decreased standing tolerance, impaired transfer ability, decreased ankle AROM, Pain in BLE, Inability to fit street shoes w/ increased falls risk, increased infection risk.    Patient Stated Goals  reduce swelling and legs feel better    Currently in Pain?  Yes   unchanged in BLE since initial eval   Pain Onset  More than a month ago                   OT Treatments/Exercises (OP) - 08/27/18 0001      ADLs   ADL Education Given  Yes      Manual Therapy   Manual Therapy  Edema management;Manual Lymphatic Drainage (MLD);Compression Bandaging    Manual therapy comments  skin care  to RLE below the knees using Eucerin low ph lotion to improve hydration.    Manual Lymphatic Drainage (MLD)  MLD to LLE utilizing short neck sequence, deep abdominals and functional inguinaol pathway.    Compression Bandaging  multi layer compression wraps reapplied as established- 4 layers below the knee to the LLE and 3 layers  and 3 layers below knee to RLE.             OT Education - 08/27/18 1604    Education provided  Yes    Education Details  Continued skilled Pt/caregiver education  And LE ADL training throughout visit for lymphedema self care/ home program, including compression wrapping, compression garment and device wear/care, lymphatic pumping ther ex, simple self-MLD, and skin care. Discussed progress towards goals    Person(s) Educated  Patient;Caregiver(s)    Methods   Explanation;Demonstration;Handout    Comprehension  Verbalized understanding;Verbal cues required;Returned demonstration          OT Long Term Goals - 07/22/18 1352      OT LONG TERM GOAL #1   Title  Lymphedema (LE) self-care: Pt able to don/doff adjustable, alternative compression garments ( aka CircAif, Francia Greaves) between OT visits with Maximal caregiver assistance within 2 weeks to  facilitate improved prognosis and optimal volume reduction during Intensive CDT.    Baseline  dependent    Time  2    Period  Weeks    Status  Achieved      OT LONG TERM GOAL #2   Title  Pt to achieve at least 10% BLE limb volume reductions during Intensive CDT to limit LE progression, decrease infection risk, to reduce foot and leg pain, and to improve ability to fit lower body clothing and shoes.    Baseline  dependent    Time  12    Period  Weeks    Status  Partially Met      OT LONG TERM GOAL #3   Title  Pt >/= 85 % compliant with all daily, LE self-care protocols for home program, including simple self-manual lymphatic drainage (MLD), skin care, lymphatic pumping the ex, skin care, and donning/ doffing compression wraps and garments with needed level of caregiver assistance to limit LE progression and further functional decline.      Baseline  dependent    Time  12    Period  Weeks    Status  Partially Met      OT LONG TERM GOAL #4   Title  Lymphedema (LE) self-care : Pt >/= 85 % compliant with all daily LE self-care protocols, including simple self-manual lymphatic drainage (MLD), skin care, lymphatic pumping therex, and donning/ doffing progression garments with needed level of caregiver assistance to limit LE progression, infection risk and further functional decline.      Baseline  dependent    Time  12    Period  Weeks    Status  Partially Met      OT LONG TERM GOAL #5   Title  Pt to tolerate daily compression wraps, garments and devices in keeping w/ prescribed wear regime within 1  week of issue date to progress and retain clinical and functional gains and to limit LE progression.    Baseline  dependent    Time  12    Period  Weeks    Status  Partially Met            Plan - 08/27/18 1605    Clinical Impression Statement  Pt has not arrived at clinic once to date wearing compression wraps. Provided MLD, skin care, and LLE multilayer wrap today. After manual therapy OT, Pt and family member had frank discussion regarding need  to demonstrate progress towards goals  in order to continue with skilled OT for LE care. Condition of Pt legs,, in terms of skin condition, swelling, is unchanged since commencing CDT , and today is Pt's 7th visit. Pt and familiy memeber instructed again in 23/7 compression wear schedule, and remaining self-care home program compronents for LE care. Pt and family member verbalize understanding of home program and need to demonstrate progress    in upcoming progress report on 10th visit in order to continue  care.      Occupational performance deficits (Please refer to evaluation for details):  ADL's;IADL's;Rest and Sleep;Play;Leisure;Social Participation;Other   body image   Rehab Potential  Fair    Current Impairments/barriers affecting progress:  Pt presents with decreased prognosis 2/2 overall health status w/ multiple comorbidities and history of non-compliance with previous  lymphedema treatment.    OT Frequency  3x / week    OT Duration  12 weeks   and PRN   OT Treatment/Interventions  Self-care/ADL training;DME and/or AE instruction;Manual lymph drainage;Patient/family education;Compression bandaging;Therapeutic exercise;Therapeutic activities;Manual Therapy;Therapist, nutritional;Other (comment)    Plan  skin care to decrease infection risk and improve hydration    Clinical Decision Making  Multiple treatment options, significant modification of task necessary    Recommended Other Services  fit with adjustable , alternative  compression wraps for optimal independence w/ donning and doffing    Consulted and Agree with Plan of Care  Patient;Family member/caregiver       Patient will benefit from skilled therapeutic intervention in order to improve the following deficits and impairments:  Decreased range of motion, Difficulty walking, Impaired flexibility, Increased edema, Decreased activity tolerance, Decreased skin integrity, Decreased balance, Decreased knowledge of use of DME, Decreased scar mobility, Pain, Decreased mobility, Decreased knowledge of precautions, Abnormal gait, Decreased cognition, Decreased endurance, Decreased safety awareness, Decreased strength, Impaired vision/preception, Impaired perceived functional ability  Visit Diagnosis: Lymphedema, not elsewhere classified    Problem List Patient Active Problem List   Diagnosis Date Noted  . Chronic pain of multiple joints 08/01/2017  . Age-related osteoporosis without current pathological fracture 08/01/2017  . Left knee pain 09/19/2016  . Refractory anemia, unspecified ([place]) 08/10/2016  . Headache 06/22/2015  . Rectal benign neoplasm   . Asthma, chronic 04/04/2015  . history of Tubular adenoma of colon 04/04/2015  . 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 Hypokalemia 04/04/2015  . CKD (chronic kidney disease) stage 3, GFR 30-59 ml/min (HCC) 04/03/2015  . Dehydration 04/03/2015  . Skin ulcer ([place])   . History of colonic polyps 12/08/2014  . Edema 04/14/2014  . Chronic systolic heart failure ([place]) 11/20/2013  . Nausea with vomiting 11/13/2013  . Lymphedema of lower extremity 10/17/2013  . 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 MGUS (monoclonal gammopathy of unknown significance) 05/15/2012  . VITAMIN D DEFICIENCY 01/29/2010  . BURSITIS, LEFT ELBOW 01/29/2010  . DERMATITIS 09/30/2009  . DIABETES MELLITUS, BORDERLINE 01/11/2009  . Paranoid delusion ([place]) 08/02/2008  . Obstructive chronic bronchitis ([place]) 08/02/2008  . Essential  hypertension 11/11/2007  . Venous (peripheral) insufficiency 11/11/2007  . RENAL CALCULUS 11/11/2007  . LOW BACK PAIN, CHRONIC 11/11/2007    Andrey Spearman, MS, OTR/L, [facility] 08/27/18 4:11 PM 00      0  [place] MAIN [facility] SERVICES [address] [place], [place], [address] Phone: [phone]   Fax:  [phone]  Name: REMY DIA MRN: [id number] Date of Birth: [birth year]

## 2018-08-28 ENCOUNTER — Ambulatory Visit: Payer: Medicare Other | Admitting: Occupational Therapy

## 2018-08-29 ENCOUNTER — Ambulatory Visit: Payer: Medicare Other | Admitting: Occupational Therapy

## 2018-08-29 DIAGNOSIS — I890 Lymphedema, not elsewhere classified: Secondary | ICD-10-CM

## 2018-08-29 NOTE — Therapy (Signed)
[place] MAIN [facility] SERVICES [address] [place], [place], [address] Phone: [phone]   Fax:  [phone]  Occupational Therapy Treatment  Patient Details  Name: [name] MRN: [id number] Date of Birth: 04-19-1932 No data recorded  Encounter Date: 08/29/2018  OT End of Session - 08/29/18 1231    Visit Number  8  (Pended)     Number of Visits  7  (Pended)     Date for OT Re-Evaluation  10/20/18  (Pended)     OT Start Time  0918  (Pended)     OT Stop Time  1008  (Pended)     OT Time Calculation (min)  50 min  (Pended)     Activity Tolerance  Patient tolerated treatment well;No increased pain  (Pended)     Behavior During Therapy  WFL for tasks assessed/performed  (Pended)        Past Medical History:  Diagnosis Date  . Anemia, unspecified   . Anxiety   . Arthritis   . Asthma   . Delusions ([place])   . Depression   . Edema 2005   "since put the glass in them"   . GERD (gastroesophageal reflux disease)   . Lumbago   . Lymphedema   . Obstructive chronic bronchitis without exacerbation ([place])   . Other abnormal glucose   . 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[place]) 04/04/2013  . Systolic heart failure (HCC)    EF 35 to 40% per echo January 2015  . Unspecified essential hypertension   . Unspecified nonpsychotic mental disorder   . Unspecified venous (peripheral) insufficiency   . Unspecified venous (peripheral) insufficiency   . Unspecified vitamin D deficiency     Past Surgical History:  Procedure Laterality Date  . APPENDECTOMY    . CERVICAL SPINE SURGERY     x 2  . CHOLECYSTECTOMY    . ESOPHAGOGASTRODUODENOSCOPY N/A 04/05/2015   Procedure: ESOPHAGOGASTRODUODENOSCOPY (EGD);  Surgeon: [name], MD;  Location: [facility];  Service: Endoscopy;  Laterality: N/A;  . FLEXIBLE SIGMOIDOSCOPY N/A 04/05/2015   Procedure: FLEXIBLE SIGMOIDOSCOPY;  Surgeon: [name], MD;  Location: [facility];  Service: Endoscopy;  Laterality: N/A;   . HAND SURGERY    . LUMBAR DISC SURGERY    . PARTIAL HYSTERECTOMY    . TONSILLECTOMY      There were no vitals filed for this visit.  Subjective Assessment - 08/29/18 1224    Subjective   Mrs. Elzey presents for OT visit 8/36 for Intensive Phase CDT to BLE. Pt is accompanied by her newphew, [name]. Pt arrives 18 minutes late. Pt presents with wraps applied at last visit ( 10/29) in place. Wraps have not been changed as instructed.    Patient is accompained by:  Family member    Pertinent History  Chronic bronchitis, CVI, anxiety , systoloc hert failure, asthma, arthritis, multiple spinal surgeries w/ chronic back pain    Limitations  Difficulty walking, decreased standing tolerance, impaired transfer ability, decreased ankle AROM, Pain in BLE, Inability to fit street shoes w/ increased falls risk, increased infection risk.    Patient Stated Goals  reduce swelling and legs feel better    Currently in Pain?  Yes    Pain Score  --   unchanged from inityial eval   Pain Onset  More than a month ago                   OT Treatments/Exercises (OP) - 08/29/18 0001  ADLs   ADL Education Given  Yes      Manual Therapy   Manual Therapy  Edema management;Manual Lymphatic Drainage (MLD);Compression Bandaging    Manual therapy comments  skin care bilaterally. Had to pour water onto stockinett at distal R le4g and dorsal foot to remove irt as fabric was saturated with dried lymphatic fluid and lite blood spotting. Using water allowed fabric to be removed without tearing fragile skin. Removed flaking skin on B feet and distal legs with fluffy bath towel. Considerable amout of skin debrided.with Clotrimazole before applying clean , knee length wraps. Meds provided my MD for exzema recently.    Manual Lymphatic Drainage (MLD)  no MLD today due to time constraints    Compression Bandaging  multi layer compression wraps reapplied as established- 4 layers below the knee to the LLE and 3  layers  and 3 layers below knee to RLE.             OT Education - 08/29/18 1231    Education provided  Yes    Education Details  Continued skilled Pt/caregiver education  And LE ADL training throughout visit for lymphedema self care/ home program, including compression wrapping, compression garment and device wear/care, lymphatic pumping ther ex, simple self-MLD, and skin care. Discussed progress towards goals    Person(s) Educated  Patient;Caregiver(s)    Methods  Explanation;Demonstration;Handout    Comprehension  Verbalized understanding;Verbal cues required;Returned demonstration          OT Long Term Goals - 07/22/18 1352      OT LONG TERM GOAL #1   Title  Lymphedema (LE) self-care: Pt able to don/doff adjustable, alternative compression garments ( aka CircAif, Francia Greaves) between OT visits with Maximal caregiver assistance within 2 weeks to  facilitate improved prognosis and optimal volume reduction during Intensive CDT.    Baseline  dependent    Time  2    Period  Weeks    Status  Achieved      OT LONG TERM GOAL #2   Title  Pt to achieve at least 10% BLE limb volume reductions during Intensive CDT to limit LE progression, decrease infection risk, to reduce foot and leg pain, and to improve ability to fit lower body clothing and shoes.    Baseline  dependent    Time  12    Period  Weeks    Status  Partially Met      OT LONG TERM GOAL #3   Title  Pt >/= 85 % compliant with all daily, LE self-care protocols for home program, including simple self-manual lymphatic drainage (MLD), skin care, lymphatic pumping the ex, skin care, and donning/ doffing compression wraps and garments with needed level of caregiver assistance to limit LE progression and further functional decline.      Baseline  dependent    Time  12    Period  Weeks    Status  Partially Met      OT LONG TERM GOAL #4   Title  Lymphedema (LE) self-care : Pt >/= 85 % compliant with all daily LE self-care  protocols, including simple self-manual lymphatic drainage (MLD), skin care, lymphatic pumping therex, and donning/ doffing progression garments with needed level of caregiver assistance to limit LE progression, infection risk and further functional decline.      Baseline  dependent    Time  12    Period  Weeks    Status  Partially Met      OT  LONG TERM GOAL #5   Title  Pt to tolerate daily compression wraps, garments and devices in keeping w/ prescribed wear regime within 1 week of issue date to progress and retain clinical and functional gains and to limit LE progression.    Baseline  dependent    Time  12    Period  Weeks    Status  Partially Met            Plan - 08/29/18 1513    Clinical Impression Statement  Pt wearing compression wraps applied at last OT visit on 10/29. Wraps were not changed and skin care performed as instructed. Stockinett had to be removed with water to limit skin tears. MLD not performed due to time constraints  due to late arrival. Performed skin care bilaterally and reapplied compression wraps. Nephew  reported that he is actively looking for paid caregiver to assist with attending OT appointments.     Occupational performance deficits (Please refer to evaluation for details):  ADL's;IADL's;Rest and Sleep;Play;Leisure;Social Participation;Other   body image   Rehab Potential  Fair    Current Impairments/barriers affecting progress:  Pt presents with decreased prognosis 2/2 overall health status w/ multiple comorbidities and history of non-compliance with previous  lymphedema treatment.    OT Frequency  3x / week    OT Duration  12 weeks   and PRN   OT Treatment/Interventions  Self-care/ADL training;DME and/or AE instruction;Manual lymph drainage;Patient/family education;Compression bandaging;Therapeutic exercise;Therapeutic activities;Manual Therapy;Therapist, nutritional;Other (comment)    Plan  skin care to decrease infection risk and improve  hydration    Clinical Decision Making  Multiple treatment options, significant modification of task necessary    Recommended Other Services  fit with adjustable , alternative compression wraps for optimal independence w/ donning and doffing    Consulted and Agree with Plan of Care  Patient;Family member/caregiver       Patient will benefit from skilled therapeutic intervention in order to improve the following deficits and impairments:  Decreased range of motion, Difficulty walking, Impaired flexibility, Increased edema, Decreased activity tolerance, Decreased skin integrity, Decreased balance, Decreased knowledge of use of DME, Decreased scar mobility, Pain, Decreased mobility, Decreased knowledge of precautions, Abnormal gait, Decreased cognition, Decreased endurance, Decreased safety awareness, Decreased strength, Impaired vision/preception, Impaired perceived functional ability  Visit Diagnosis: Lymphedema, not elsewhere classified    Problem List Patient Active Problem List   Diagnosis Date Noted  . Chronic pain of multiple joints 08/01/2017  . Age-related osteoporosis without current pathological fracture 08/01/2017  . Left knee pain 09/19/2016  . Refractory anemia, unspecified ([place]) 08/10/2016  . Headache 06/22/2015  . Rectal benign neoplasm   . Asthma, chronic 04/04/2015  . history of Tubular adenoma of colon 04/04/2015  . 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 Hypokalemia 04/04/2015  . CKD (chronic kidney disease) stage 3, GFR 30-59 ml/min (HCC) 04/03/2015  . Dehydration 04/03/2015  . Skin ulcer ([place])   . History of colonic polyps 12/08/2014  . Edema 04/14/2014  . Chronic systolic heart failure ([place]) 11/20/2013  . Nausea with vomiting 11/13/2013  . Lymphedema of lower extremity 10/17/2013  . 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 MGUS (monoclonal gammopathy of unknown significance) 05/15/2012  . VITAMIN D DEFICIENCY 01/29/2010  . BURSITIS, LEFT ELBOW 01/29/2010   . DERMATITIS 09/30/2009  . DIABETES MELLITUS, BORDERLINE 01/11/2009  . Paranoid delusion ([place]) 08/02/2008  . Obstructive chronic bronchitis ([place]) 08/02/2008  . Essential hypertension 11/11/2007  . Venous (peripheral) insufficiency 11/11/2007  . RENAL  CALCULUS 11/11/2007  . LOW BACK PAIN, CHRONIC 11/11/2007    Andrey Spearman, MS, OTR/L, [facility] 08/29/18 3:31 PM  [place] MAIN [facility] SERVICES [address] [place], [place], [address] Phone: [phone]   Fax:  [phone]  Name: TAISIA FANTINI MRN: [id number] Date of Birth: [birth year]

## 2018-09-02 ENCOUNTER — Ambulatory Visit: Payer: Medicare Other | Attending: Internal Medicine | Admitting: Occupational Therapy

## 2018-09-02 DIAGNOSIS — I890 Lymphedema, not elsewhere classified: Secondary | ICD-10-CM | POA: Insufficient documentation

## 2018-09-03 ENCOUNTER — Ambulatory Visit: Payer: Medicare Other | Admitting: Occupational Therapy

## 2018-09-03 DIAGNOSIS — I890 Lymphedema, not elsewhere classified: Secondary | ICD-10-CM

## 2018-09-03 NOTE — Therapy (Signed)
[place] MAIN [facility] SERVICES [address] [place], [place], [address] Phone: [phone]   Fax:  [phone]  Occupational Therapy Treatment  Patient Details  Name: [name] MRN: [id number] Date of Birth: [birth year] No data recorded  Encounter Date: 09/02/2018  OT End of Session - 09/02/18 1225    Visit Number  9    Number of Visits  36    Date for OT Re-Evaluation  10/20/18    OT Start Time  0915    OT Stop Time  1025    OT Time Calculation (min)  70 min    Activity Tolerance  Patient tolerated treatment well;No increased pain    Behavior During Therapy  WFL for tasks assessed/performed       Past Medical History:  Diagnosis Date  . Anemia, unspecified   . Anxiety   . Arthritis   . Asthma   . Delusions ([place])   . Depression   . Edema 2005   "since put the glass in them"   . GERD (gastroesophageal reflux disease)   . Lumbago   . Lymphedema   . Obstructive chronic bronchitis without exacerbation ([place])   . Other abnormal glucose   . 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[place]) 04/04/2013  . Systolic heart failure (HCC)    EF 35 to 40% per echo January 2015  . Unspecified essential hypertension   . Unspecified nonpsychotic mental disorder   . Unspecified venous (peripheral) insufficiency   . Unspecified venous (peripheral) insufficiency   . Unspecified vitamin D deficiency     Past Surgical History:  Procedure Laterality Date  . APPENDECTOMY    . CERVICAL SPINE SURGERY     x 2  . CHOLECYSTECTOMY    . ESOPHAGOGASTRODUODENOSCOPY N/A 04/05/2015   Procedure: ESOPHAGOGASTRODUODENOSCOPY (EGD);  Surgeon: [name], MD;  Location: [facility];  Service: Endoscopy;  Laterality: N/A;  . FLEXIBLE SIGMOIDOSCOPY N/A 04/05/2015   Procedure: FLEXIBLE SIGMOIDOSCOPY;  Surgeon: [name], MD;  Location: [facility];  Service: Endoscopy;  Laterality: N/A;  . HAND SURGERY    . LUMBAR DISC SURGERY    . PARTIAL HYSTERECTOMY    . TONSILLECTOMY       There were no vitals filed for this visit.  Subjective Assessment - 09/02/18 0930    Subjective   [name] presents for OT visit 9/36 for Intensive Phase CDT to BLE. Pt is accompanied by her newphew, [name]. Pt has has no new complaints. Pt reports she was able to stay wrapped over the weekend.    Patient is accompained by:  Family member    Pertinent History  Chronic bronchitis, CVI, anxiety , systoloc hert failure, asthma, arthritis, multiple spinal surgeries w/ chronic back pain    Limitations  Difficulty walking, decreased standing tolerance, impaired transfer ability, decreased ankle AROM, Pain in BLE, Inability to fit street shoes w/ increased falls risk, increased infection risk.    Patient Stated Goals  reduce swelling and legs feel better    Pain Onset  More than a month ago                           OT Education - 09/02/18 1225    Education provided  Yes    Education Details  Continued skilled Pt/caregiver education  And LE ADL training throughout visit for lymphedema self care/ home program, including compression wrapping, compression garment and device wear/care, lymphatic pumping ther ex, simple self-MLD, and  skin care. Discussed progress towards goals    Person(s) Educated  Patient;Caregiver(s)    Methods  Explanation;Demonstration;Handout    Comprehension  Verbalized understanding;Verbal cues required;Returned demonstration          OT Long Term Goals - 07/22/18 1352      OT LONG TERM GOAL #1   Title  Lymphedema (LE) self-care: Pt able to don/doff adjustable, alternative compression garments ( aka CircAif, Francia Greaves) between OT visits with Maximal caregiver assistance within 2 weeks to  facilitate improved prognosis and optimal volume reduction during Intensive CDT.    Baseline  dependent    Time  2    Period  Weeks    Status  Achieved      OT LONG TERM GOAL #2   Title  Pt to achieve at least 10% BLE limb volume reductions during Intensive  CDT to limit LE progression, decrease infection risk, to reduce foot and leg pain, and to improve ability to fit lower body clothing and shoes.    Baseline  dependent    Time  12    Period  Weeks    Status  Partially Met      OT LONG TERM GOAL #3   Title  Pt >/= 85 % compliant with all daily, LE self-care protocols for home program, including simple self-manual lymphatic drainage (MLD), skin care, lymphatic pumping the ex, skin care, and donning/ doffing compression wraps and garments with needed level of caregiver assistance to limit LE progression and further functional decline.      Baseline  dependent    Time  12    Period  Weeks    Status  Partially Met      OT LONG TERM GOAL #4   Title  Lymphedema (LE) self-care : Pt >/= 85 % compliant with all daily LE self-care protocols, including simple self-manual lymphatic drainage (MLD), skin care, lymphatic pumping therex, and donning/ doffing progression garments with needed level of caregiver assistance to limit LE progression, infection risk and further functional decline.      Baseline  dependent    Time  12    Period  Weeks    Status  Partially Met      OT LONG TERM GOAL #5   Title  Pt to tolerate daily compression wraps, garments and devices in keeping w/ prescribed wear regime within 1 week of issue date to progress and retain clinical and functional gains and to limit LE progression.    Baseline  dependent    Time  12    Period  Weeks    Status  Partially Met            Plan - 09/03/18 1226    Clinical Impression Statement  Pt presents with wraps in place and brings all needed supplies to clinic. Provided MLD, skin care and compression therapy as established. Skin condition remains severly dry and flaky, but no bleeding or lymphorrhea observed today. Limb volume also appears slightly reduced by visual assessment. Cont as per POC. Nephew is working on retaining paid caregiver to assist [name] with transportation to clinic  and LE self care between sessions.    Occupational performance deficits (Please refer to evaluation for details):  ADL's;IADL's;Rest and Sleep;Play;Leisure;Social Participation;Other   body image   Rehab Potential  Fair    Current Impairments/barriers affecting progress:  Pt presents with decreased prognosis 2/2 overall health status w/ multiple comorbidities and history of non-compliance with previous  lymphedema treatment.  OT Frequency  3x / week    OT Duration  12 weeks   and PRN   OT Treatment/Interventions  Self-care/ADL training;DME and/or AE instruction;Manual lymph drainage;Patient/family education;Compression bandaging;Therapeutic exercise;Therapeutic activities;Manual Therapy;Therapist, nutritional;Other (comment)    Plan  skin care to decrease infection risk and improve hydration    Clinical Decision Making  Multiple treatment options, significant modification of task necessary    Recommended Other Services  fit with adjustable , alternative compression wraps for optimal independence w/ donning and doffing    Consulted and Agree with Plan of Care  Patient;Family member/caregiver       Patient will benefit from skilled therapeutic intervention in order to improve the following deficits and impairments:  Decreased range of motion, Difficulty walking, Impaired flexibility, Increased edema, Decreased activity tolerance, Decreased skin integrity, Decreased balance, Decreased knowledge of use of DME, Decreased scar mobility, Pain, Decreased mobility, Decreased knowledge of precautions, Abnormal gait, Decreased cognition, Decreased endurance, Decreased safety awareness, Decreased strength, Impaired vision/preception, Impaired perceived functional ability  Visit Diagnosis: Lymphedema, not elsewhere classified    Problem List Patient Active Problem List   Diagnosis Date Noted  . Chronic pain of multiple joints 08/01/2017  . Age-related osteoporosis without current pathological  fracture 08/01/2017  . Left knee pain 09/19/2016  . Refractory anemia, unspecified ([place]) 08/10/2016  . Headache 06/22/2015  . Rectal benign neoplasm   . Asthma, chronic 04/04/2015  . history of Tubular adenoma of colon 04/04/2015  . 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 Hypokalemia 04/04/2015  . CKD (chronic kidney disease) stage 3, GFR 30-59 ml/min (HCC) 04/03/2015  . Dehydration 04/03/2015  . Skin ulcer ([place])   . History of colonic polyps 12/08/2014  . Edema 04/14/2014  . Chronic systolic heart failure ([place]) 11/20/2013  . Nausea with vomiting 11/13/2013  . Lymphedema of lower extremity 10/17/2013  . 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 MGUS (monoclonal gammopathy of unknown significance) 05/15/2012  . VITAMIN D DEFICIENCY 01/29/2010  . BURSITIS, LEFT ELBOW 01/29/2010  . DERMATITIS 09/30/2009  . DIABETES MELLITUS, BORDERLINE 01/11/2009  . Paranoid delusion ([place]) 08/02/2008  . Obstructive chronic bronchitis ([place]) 08/02/2008  . Essential hypertension 11/11/2007  . Venous (peripheral) insufficiency 11/11/2007  . RENAL CALCULUS 11/11/2007  . LOW BACK PAIN, CHRONIC 11/11/2007    Andrey Spearman, MS, OTR/L, [facility] 09/03/18 12:30 PM  [place] MAIN [facility] SERVICES [address] [place], [place], [address] Phone: [phone]   Fax:  [phone]  Name: MARYJAYNE KLEVEN MRN: [id number] Date of Birth: 01/05/1932

## 2018-09-03 NOTE — Therapy (Signed)
[place] MAIN [facility] SERVICES [address] [place], [place], [address] Phone: [phone]   Fax:  [phone]  Occupational Therapy Treatment Note and Progress Report   Patient Details  Name: [name] MRN: [id number] Date of Birth: August 01, 1932 No data recorded  Encounter Date: 09/03/2018  OT End of Session - 09/03/18 1541    Visit Number  10    Number of Visits  36    Date for OT Re-Evaluation  10/20/18    OT Start Time  0905    OT Stop Time  1015    OT Time Calculation (min)  70 min    Activity Tolerance  Patient tolerated treatment well;No increased pain    Behavior During Therapy  WFL for tasks assessed/performed       Past Medical History:  Diagnosis Date  . Anemia, unspecified   . Anxiety   . Arthritis   . Asthma   . Delusions ([place])   . Depression   . Edema 2005   "since put the glass in them"   . GERD (gastroesophageal reflux disease)   . Lumbago   . Lymphedema   . Obstructive chronic bronchitis without exacerbation ([place])   . Other abnormal glucose   . 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 Systolic heart failure (HCC)    EF 35 to 40% per echo January 2015  . Unspecified essential hypertension   . Unspecified nonpsychotic mental disorder   . Unspecified venous (peripheral) insufficiency   . Unspecified venous (peripheral) insufficiency   . Unspecified vitamin D deficiency     Past Surgical History:  Procedure Laterality Date  . APPENDECTOMY    . CERVICAL SPINE SURGERY     x 2  . CHOLECYSTECTOMY    . ESOPHAGOGASTRODUODENOSCOPY N/A 04/05/2015   Procedure: ESOPHAGOGASTRODUODENOSCOPY (EGD);  Surgeon: [name], MD;  Location: [facility];  Service: Endoscopy;  Laterality: N/A;  . FLEXIBLE SIGMOIDOSCOPY N/A 04/05/2015   Procedure: FLEXIBLE SIGMOIDOSCOPY;  Surgeon: [name], MD;  Location: [facility];  Service: Endoscopy;  Laterality: N/A;  . HAND SURGERY    . LUMBAR DISC SURGERY    . PARTIAL  HYSTERECTOMY    . TONSILLECTOMY      There were no vitals filed for this visit.  Subjective Assessment - 09/03/18 1539    Subjective   [name] presents for OT visit 10/36 for Intensive Phase CDT to BLE. Pt is accompanied by her newphew, [name]. Pt reports she left compression wrwaps in place over night for last 2 nights without difficulty.    Patient is accompained by:  Family member    Pertinent History  Chronic bronchitis, CVI, anxiety , systoloc hert failure, asthma, arthritis, multiple spinal surgeries w/ chronic back pain    Limitations  Difficulty walking, decreased standing tolerance, impaired transfer ability, decreased ankle AROM, Pain in BLE, Inability to fit street shoes w/ increased falls risk, increased infection risk.    Patient Stated Goals  reduce swelling and legs feel better    Pain Onset  More than a month ago                   OT Treatments/Exercises (OP) - 09/03/18 0001      ADLs   ADL Education Given  Yes      Manual Therapy   Manual Therapy  Edema management;Manual Lymphatic Drainage (MLD);Compression Bandaging    Manual therapy comments  skin care bilaterally. Had to pour water onto stockinett at  distal R le4g and dorsal foot to remove irt as fabric was saturated with dried lymphatic fluid and lite blood spotting. Using water allowed fabric to be removed without tearing fragile skin. Removed flaking skin on B feet and distal legs with fluffy bath towel. Considerable amout of skin debrided.with Clotrimazole before applying clean , knee length wraps. Meds provided my MD for exzema recently.    Manual Lymphatic Drainage (MLD)  MLD to LLE as established    Compression Bandaging  multi layer compression wraps reapplied as established- 4 layers below the knee to the LLE and 3 layers  and 3 layers below knee to RLE.             OT Education - 09/03/18 1541    Education provided  Yes    Education Details  Continued skilled Pt/caregiver education  And  LE ADL training throughout visit for lymphedema self care/ home program, including compression wrapping, compression garment and device wear/care, lymphatic pumping ther ex, simple self-MLD, and skin care. Discussed progress towards goals    Person(s) Educated  Patient;Caregiver(s)    Methods  Explanation;Demonstration;Handout    Comprehension  Verbalized understanding;Verbal cues required;Returned demonstration          OT Long Term Goals - 09/03/18 1542      OT LONG TERM GOAL #1   Title  Lymphedema (LE) self-care: Pt able to don/doff adjustable, alternative compression garments ( aka CircAif, Francia Greaves) between OT visits with Maximal caregiver assistance within 2 weeks to  facilitate improved prognosis and optimal volume reduction during Intensive CDT.    Baseline  dependent    Time  2    Period  Weeks    Status  Achieved      OT LONG TERM GOAL #2   Title  Pt to achieve at least 10% BLE limb volume reductions during Intensive CDT to limit LE progression, decrease infection risk, to reduce foot and leg pain, and to improve ability to fit lower body clothing and shoes.    Baseline  dependent    Time  12    Period  Weeks    Status  Partially Met      OT LONG TERM GOAL #3   Title  Pt >/= 85 % compliant with all daily, LE self-care protocols for home program, including simple self-manual lymphatic drainage (MLD), skin care, lymphatic pumping the ex, skin care, and donning/ doffing compression wraps and garments with needed level of caregiver assistance to limit LE progression and further functional decline.      Baseline  dependent    Time  12    Period  Weeks    Status  Partially Met      OT LONG TERM GOAL #4   Title  Lymphedema (LE) self-care : Pt >/= 85 % compliant with all daily LE self-care protocols, including simple self-manual lymphatic drainage (MLD), skin care, lymphatic pumping therex, and donning/ doffing progression garments with needed level of caregiver assistance to  limit LE progression, infection risk and further functional decline.      Baseline  dependent    Time  12    Period  Weeks    Status  Partially Met      OT LONG TERM GOAL #5   Title  Pt to tolerate daily compression wraps, garments and devices in keeping w/ prescribed wear regime within 1 week of issue date to progress and retain clinical and functional gains and to limit LE progression.  Baseline  dependent    Time  12    Period  Weeks    Status  Partially Met            Plan - 09/03/18 1545    Clinical Impression Statement  [name] presentas for OT visit 10/36 for BLE lymphedema tretamnet. Pt demonstrates some progress in improved skin conditionnand decreased swelling  in BLE today sice commencing tretament. Pt has left wraps in place overnight and swelling is notably decreased. Skin hydration improved over the last couple of days. Bleeding has stopped and family is actively working on finding a paid caregiver to transport pt to visits consistently and on time. See LT Goals section for details of progress todate. Greatest obstacles to progress thus far have been tardiness, absenteeism, concompliance with compression, and limited family assistance between sessions. Pt tolerated MLD o RLE and skin care and compression wraps to both legs today in clinic. Cont 3 x weekly and cont to support increased family support.     Occupational performance deficits (Please refer to evaluation for details):  ADL's;IADL's;Rest and Sleep;Play;Leisure;Social Participation;Other   body image   Rehab Potential  Fair    Current Impairments/barriers affecting progress:  Pt presents with decreased prognosis 2/2 overall health status w/ multiple comorbidities and history of non-compliance with previous  lymphedema treatment.    OT Frequency  3x / week    OT Duration  12 weeks   and PRN   OT Treatment/Interventions  Self-care/ADL training;DME and/or AE instruction;Manual lymph drainage;Patient/family  education;Compression bandaging;Therapeutic exercise;Therapeutic activities;Manual Therapy;Therapist, nutritional;Other (comment)    Plan  skin care to decrease infection risk and improve hydration    Clinical Decision Making  Multiple treatment options, significant modification of task necessary    Recommended Other Services  fit with adjustable , alternative compression wraps for optimal independence w/ donning and doffing    Consulted and Agree with Plan of Care  Patient;Family member/caregiver       Patient will benefit from skilled therapeutic intervention in order to improve the following deficits and impairments:  Decreased range of motion, Difficulty walking, Impaired flexibility, Increased edema, Decreased activity tolerance, Decreased skin integrity, Decreased balance, Decreased knowledge of use of DME, Decreased scar mobility, Pain, Decreased mobility, Decreased knowledge of precautions, Abnormal gait, Decreased cognition, Decreased endurance, Decreased safety awareness, Decreased strength, Impaired vision/preception, Impaired perceived functional ability  Visit Diagnosis: Lymphedema, not elsewhere classified    Problem List Patient Active Problem List   Diagnosis Date Noted  . Chronic pain of multiple joints 08/01/2017  . Age-related osteoporosis without current pathological fracture 08/01/2017  . Left knee pain 09/19/2016  . Refractory anemia, unspecified ([place]) 08/10/2016  . Headache 06/22/2015  . Rectal benign neoplasm   . Asthma, chronic 04/04/2015  . history of Tubular adenoma of colon 04/04/2015  . 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 Hypokalemia 04/04/2015  . CKD (chronic kidney disease) stage 3, GFR 30-59 ml/min (HCC) 04/03/2015  . Dehydration 04/03/2015  . Skin ulcer ([place])   . History of colonic polyps 12/08/2014  . Edema 04/14/2014  . Chronic systolic heart failure ([place]) 11/20/2013  . Nausea with vomiting 11/13/2013  . Lymphedema of lower  extremity 10/17/2013  . 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 MGUS (monoclonal gammopathy of unknown significance) 05/15/2012  . VITAMIN D DEFICIENCY 01/29/2010  . BURSITIS, LEFT ELBOW 01/29/2010  . DERMATITIS 09/30/2009  . DIABETES MELLITUS, BORDERLINE 01/11/2009  . Paranoid delusion ([place]) 08/02/2008  . Obstructive chronic bronchitis ([place]) 08/02/2008  .  Essential hypertension 11/11/2007  . Venous (peripheral) insufficiency 11/11/2007  . RENAL CALCULUS 11/11/2007  . LOW BACK PAIN, CHRONIC 11/11/2007    Andrey Spearman, MS, OTR/L, [facility] 09/03/18 3:51 PM   [place] MAIN [facility] SERVICES [address] [place], [place], [address] Phone: [phone]   Fax:  [phone]  Name: [name] MRN: [id number] Date of Birth: [birth year]

## 2018-09-05 ENCOUNTER — Ambulatory Visit: Payer: Medicare Other | Admitting: Occupational Therapy

## 2018-09-05 DIAGNOSIS — I890 Lymphedema, not elsewhere classified: Secondary | ICD-10-CM | POA: Diagnosis not present

## 2018-09-05 NOTE — Therapy (Signed)
[place] MAIN [facility] SERVICES [address] [place], [place], [address] Phone: [phone]   Fax:  [phone]  Occupational Therapy Treatment  Patient Details  Name: [name] MRN: [id number] Date of Birth: Mar 06, 1932 No data recorded  Encounter Date: 09/05/2018  OT End of Session - 09/05/18 1515    Visit Number  11    Number of Visits  36    Date for OT Re-Evaluation  10/20/18    OT Start Time  0908    OT Stop Time  1010    OT Time Calculation (min)  62 min    Activity Tolerance  Patient tolerated treatment well;No increased pain    Behavior During Therapy  WFL for tasks assessed/performed       Past Medical History:  Diagnosis Date  . Anemia, unspecified   . Anxiety   . Arthritis   . Asthma   . Delusions ([place])   . Depression   . Edema 2005   "since put the glass in them"   . GERD (gastroesophageal reflux disease)   . Lumbago   . Lymphedema   . Obstructive chronic bronchitis without exacerbation ([place])   . Other abnormal glucose   . 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 Systolic heart failure (HCC)    EF 35 to 40% per echo January 2015  . Unspecified essential hypertension   . Unspecified nonpsychotic mental disorder   . Unspecified venous (peripheral) insufficiency   . Unspecified venous (peripheral) insufficiency   . Unspecified vitamin D deficiency     Past Surgical History:  Procedure Laterality Date  . APPENDECTOMY    . CERVICAL SPINE SURGERY     x 2  . CHOLECYSTECTOMY    . ESOPHAGOGASTRODUODENOSCOPY N/A 04/05/2015   Procedure: ESOPHAGOGASTRODUODENOSCOPY (EGD);  Surgeon: [name], MD;  Location: [facility];  Service: Endoscopy;  Laterality: N/A;  . FLEXIBLE SIGMOIDOSCOPY N/A 04/05/2015   Procedure: FLEXIBLE SIGMOIDOSCOPY;  Surgeon: [name], MD;  Location: [facility];  Service: Endoscopy;  Laterality: N/A;  . HAND SURGERY    . LUMBAR DISC SURGERY    . PARTIAL HYSTERECTOMY    . TONSILLECTOMY       There were no vitals filed for this visit.  Subjective Assessment - 09/05/18 1512    Subjective   [name] presents for OT visit 11/36 for Intensive Phase CDT to BLE. Pt is accompanied by her newphew, [name]. Pt reports with compression wraps applied 2 days ago in place. Pt and nephew report that they have hired a caregiver to assist Pt at home and to transport her to OT visits on trial basis starting next week.    Patient is accompained by:  Family member    Pertinent History  Chronic bronchitis, CVI, anxiety , systoloc hert failure, asthma, arthritis, multiple spinal surgeries w/ chronic back pain    Limitations  Difficulty walking, decreased standing tolerance, impaired transfer ability, decreased ankle AROM, Pain in BLE, Inability to fit street shoes w/ increased falls risk, increased infection risk.    Patient Stated Goals  reduce swelling and legs feel better    Pain Onset  More than a month ago                   OT Treatments/Exercises (OP) - 09/05/18 0001      ADLs   ADL Education Given  Yes      Manual Therapy   Manual Therapy  Edema management;Manual Lymphatic Drainage (MLD);Compression  Bandaging    Manual therapy comments  skin care bilaterally below knees using prescribed Clotrimazole and Betamethasone cream    Manual Lymphatic Drainage (MLD)  MLD to LLE as established    Compression Bandaging  multi layer compression wraps reapplied as established- 4 layers below the knee to the LLE and 3 layers  and 3 layers below knee to RLE.             OT Education - 09/05/18 1515    Education provided  Yes    Education Details  Continued skilled Pt/caregiver education  And LE ADL training throughout visit for lymphedema self care/ home program, including compression wrapping, compression garment and device wear/care, lymphatic pumping ther ex, simple self-MLD, and skin care. Discussed progress towards goals    Person(s) Educated  Patient;Caregiver(s)    Methods   Explanation;Demonstration;Handout    Comprehension  Verbalized understanding;Verbal cues required;Returned demonstration          OT Long Term Goals - 09/03/18 1542      OT LONG TERM GOAL #1   Title  Lymphedema (LE) self-care: Pt able to don/doff adjustable, alternative compression garments ( aka CircAif, Francia Greaves) between OT visits with Maximal caregiver assistance within 2 weeks to  facilitate improved prognosis and optimal volume reduction during Intensive CDT.    Baseline  dependent    Time  2    Period  Weeks    Status  Achieved      OT LONG TERM GOAL #2   Title  Pt to achieve at least 10% BLE limb volume reductions during Intensive CDT to limit LE progression, decrease infection risk, to reduce foot and leg pain, and to improve ability to fit lower body clothing and shoes.    Baseline  dependent    Time  12    Period  Weeks    Status  Partially Met      OT LONG TERM GOAL #3   Title  Pt >/= 85 % compliant with all daily, LE self-care protocols for home program, including simple self-manual lymphatic drainage (MLD), skin care, lymphatic pumping the ex, skin care, and donning/ doffing compression wraps and garments with needed level of caregiver assistance to limit LE progression and further functional decline.      Baseline  dependent    Time  12    Period  Weeks    Status  Partially Met      OT LONG TERM GOAL #4   Title  Lymphedema (LE) self-care : Pt >/= 85 % compliant with all daily LE self-care protocols, including simple self-manual lymphatic drainage (MLD), skin care, lymphatic pumping therex, and donning/ doffing progression garments with needed level of caregiver assistance to limit LE progression, infection risk and further functional decline.      Baseline  dependent    Time  12    Period  Weeks    Status  Partially Met      OT LONG TERM GOAL #5   Title  Pt to tolerate daily compression wraps, garments and devices in keeping w/ prescribed wear regime within 1  week of issue date to progress and retain clinical and functional gains and to limit LE progression.    Baseline  dependent    Time  12    Period  Weeks    Status  Partially Met            Plan - 09/05/18 1515    Clinical Impression Statement  P  Occupational Profile and client history currently impacting functional performance  Provided MLD, skin care, Pt and family edu for LE self care and compression therapy in clinic today. Skin condition continues to improve significantly thiis week since compliance with compression between visits improved. New CG starts next week. She is former LE patient, so should be a greeat assistance to [name] and expect improved prognosis with more consistent care. Cont as per POC.    Occupational performance deficits (Please refer to evaluation for details):  ADL's;IADL's;Rest and Sleep;Play;Leisure;Social Participation;Other   body image   Rehab Potential  Fair    Current Impairments/barriers affecting progress:  Pt presents with decreased prognosis 2/2 overall health status w/ multiple comorbidities and history of non-compliance with previous  lymphedema treatment.    OT Frequency  3x / week    OT Duration  12 weeks   and PRN   OT Treatment/Interventions  Self-care/ADL training;DME and/or AE instruction;Manual lymph drainage;Patient/family education;Compression bandaging;Therapeutic exercise;Therapeutic activities;Manual Therapy;Therapist, nutritional;Other (comment)    Plan  skin care to decrease infection risk and improve hydration    Clinical Decision Making  Multiple treatment options, significant modification of task necessary    Recommended Other Services  fit with adjustable , alternative compression wraps for optimal independence w/ donning and doffing    Consulted and Agree with Plan of Care  Patient;Family member/caregiver       Patient will benefit from skilled therapeutic intervention in order to improve the following deficits  and impairments:  Decreased range of motion, Difficulty walking, Impaired flexibility, Increased edema, Decreased activity tolerance, Decreased skin integrity, Decreased balance, Decreased knowledge of use of DME, Decreased scar mobility, Pain, Decreased mobility, Decreased knowledge of precautions, Abnormal gait, Decreased cognition, Decreased endurance, Decreased safety awareness, Decreased strength, Impaired vision/preception, Impaired perceived functional ability  Visit Diagnosis: Lymphedema, not elsewhere classified    Problem List Patient Active Problem List   Diagnosis Date Noted  . Chronic pain of multiple joints 08/01/2017  . Age-related osteoporosis without current pathological fracture 08/01/2017  . Left knee pain 09/19/2016  . Refractory anemia, unspecified ([place]) 08/10/2016  . Headache 06/22/2015  . Rectal benign neoplasm   . Asthma, chronic 04/04/2015  . history of Tubular adenoma of colon 04/04/2015  . 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 Hypokalemia 04/04/2015  . CKD (chronic kidney disease) stage 3, GFR 30-59 ml/min (HCC) 04/03/2015  . Dehydration 04/03/2015  . Skin ulcer ([place])   . History of colonic polyps 12/08/2014  . Edema 04/14/2014  . Chronic systolic heart failure ([place]) 11/20/2013  . Nausea with vomiting 11/13/2013  . Lymphedema of lower extremity 10/17/2013  . 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 MGUS (monoclonal gammopathy of unknown significance) 05/15/2012  . VITAMIN D DEFICIENCY 01/29/2010  . BURSITIS, LEFT ELBOW 01/29/2010  . DERMATITIS 09/30/2009  . DIABETES MELLITUS, BORDERLINE 01/11/2009  . Paranoid delusion ([place]) 08/02/2008  . Obstructive chronic bronchitis ([place]) 08/02/2008  . Essential hypertension 11/11/2007  . Venous (peripheral) insufficiency 11/11/2007  . RENAL CALCULUS 11/11/2007  . LOW BACK PAIN, CHRONIC 11/11/2007    Andrey Spearman, MS, OTR/L, [facility] 09/05/18 3:19 PM  [place] MAIN  [facility] SERVICES [address] [place], [place], [address] Phone: [phone]   Fax:  [phone]  Name: [name] MRN: [id number] Date of Birth: 1932/04/17

## 2018-09-10 ENCOUNTER — Ambulatory Visit: Payer: Medicare Other | Admitting: Occupational Therapy

## 2018-09-10 DIAGNOSIS — I890 Lymphedema, not elsewhere classified: Secondary | ICD-10-CM

## 2018-09-10 NOTE — Therapy (Signed)
[place] MAIN [facility] SERVICES [address] [place], [place], [address] Phone: [phone]   Fax:  [phone]  Occupational Therapy Treatment  Patient Details  Name: [name] MRN: [id number] Date of Birth: 12-01-1931 No data recorded  Encounter Date: 09/10/2018  OT End of Session - 09/10/18 1128    Visit Number  12    Number of Visits  36    Date for OT Re-Evaluation  10/20/18    OT Start Time  0950    OT Stop Time  1100    OT Time Calculation (min)  70 min    Activity Tolerance  Patient tolerated treatment well;No increased pain    Behavior During Therapy  WFL for tasks assessed/performed       Past Medical History:  Diagnosis Date  . Anemia, unspecified   . Anxiety   . Arthritis   . Asthma   . Delusions ([place])   . Depression   . Edema 2005   "since put the glass in them"   . GERD (gastroesophageal reflux disease)   . Lumbago   . Lymphedema   . Obstructive chronic bronchitis without exacerbation ([place])   . Other abnormal glucose   . 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[place]) 04/04/2013  . Systolic heart failure (HCC)    EF 35 to 40% per echo January 2015  . Unspecified essential hypertension   . Unspecified nonpsychotic mental disorder   . Unspecified venous (peripheral) insufficiency   . Unspecified venous (peripheral) insufficiency   . Unspecified vitamin D deficiency     Past Surgical History:  Procedure Laterality Date  . APPENDECTOMY    . CERVICAL SPINE SURGERY     x 2  . CHOLECYSTECTOMY    . ESOPHAGOGASTRODUODENOSCOPY N/A 04/05/2015   Procedure: ESOPHAGOGASTRODUODENOSCOPY (EGD);  Surgeon: [name], MD;  Location: [facility];  Service: Endoscopy;  Laterality: N/A;  . FLEXIBLE SIGMOIDOSCOPY N/A 04/05/2015   Procedure: FLEXIBLE SIGMOIDOSCOPY;  Surgeon: [name], MD;  Location: [facility];  Service: Endoscopy;  Laterality: N/A;  . HAND SURGERY    . LUMBAR DISC SURGERY    . PARTIAL HYSTERECTOMY    . TONSILLECTOMY       There were no vitals filed for this visit.  Subjective Assessment - 09/10/18 1125    Subjective   Mrs. Badilla presents 10 minutes early for OT visit 12/36  to address BLE LE utilizing CDT.  Pt is accompanied by new private caregiver, who will assist with daily LE self care home program and ensure Pt attends ot SESSIONS. CG is former LE Pt , so is well qualified to apply compression wraps, perform skin care and monitor swelling,.    Patient is accompained by:  Family member    Pertinent History  Chronic bronchitis, CVI, anxiety , systoloc hert failure, asthma, arthritis, multiple spinal surgeries w/ chronic back pain    Limitations  Difficulty walking, decreased standing tolerance, impaired transfer ability, decreased ankle AROM, Pain in BLE, Inability to fit street shoes w/ increased falls risk, increased infection risk.    Patient Stated Goals  reduce swelling and legs feel better    Currently in Pain?  No/denies    Pain Onset  More than a month ago                   OT Treatments/Exercises (OP) - 09/10/18 0001      ADLs   ADL Education Given  Yes      Manual Therapy  Manual Therapy  Edema management;Manual Lymphatic Drainage (MLD);Compression Bandaging    Manual therapy comments  skin care bilaterally below knees using prescribed Clotrimazole and Betamethasone cream    Manual Lymphatic Drainage (MLD)  MLD to LLE as established    Compression Bandaging  No wraps in clinic today. Pt forgot bandages. CG will apply once home as instructed.             OT Education - 09/10/18 1128    Education provided  Yes    Education Details  Reviewed bandaging instructions w/ CG.    Person(s) Educated  Patient;Caregiver(s)    Methods  Explanation;Demonstration    Comprehension  Verbalized understanding;Verbal cues required;Returned demonstration          OT Long Term Goals - 09/03/18 1542      OT LONG TERM GOAL #1   Title  Lymphedema (LE) self-care: Pt able to  don/doff adjustable, alternative compression garments ( aka CircAif, Francia Greaves) between OT visits with Maximal caregiver assistance within 2 weeks to  facilitate improved prognosis and optimal volume reduction during Intensive CDT.    Baseline  dependent    Time  2    Period  Weeks    Status  Achieved      OT LONG TERM GOAL #2   Title  Pt to achieve at least 10% BLE limb volume reductions during Intensive CDT to limit LE progression, decrease infection risk, to reduce foot and leg pain, and to improve ability to fit lower body clothing and shoes.    Baseline  dependent    Time  12    Period  Weeks    Status  Partially Met      OT LONG TERM GOAL #3   Title  Pt >/= 85 % compliant with all daily, LE self-care protocols for home program, including simple self-manual lymphatic drainage (MLD), skin care, lymphatic pumping the ex, skin care, and donning/ doffing compression wraps and garments with needed level of caregiver assistance to limit LE progression and further functional decline.      Baseline  dependent    Time  12    Period  Weeks    Status  Partially Met      OT LONG TERM GOAL #4   Title  Lymphedema (LE) self-care : Pt >/= 85 % compliant with all daily LE self-care protocols, including simple self-manual lymphatic drainage (MLD), skin care, lymphatic pumping therex, and donning/ doffing progression garments with needed level of caregiver assistance to limit LE progression, infection risk and further functional decline.      Baseline  dependent    Time  12    Period  Weeks    Status  Partially Met      OT LONG TERM GOAL #5   Title  Pt to tolerate daily compression wraps, garments and devices in keeping w/ prescribed wear regime within 1 week of issue date to progress and retain clinical and functional gains and to limit LE progression.    Baseline  dependent    Time  12    Period  Weeks    Status  Partially Met            Plan - 09/10/18 1129    Clinical Impression  Statement  BLE swelling is well controlled tyhis morning. Pt reports she left comrpession wraps in place until this morning when she bathed in prep for OT session. Provbided MLD, skin care and compression wrapn demo for CG . CG  will apply wraps at home after session as Pt forgot to bring her supplies this morning. Cont as per POC.    Occupational performance deficits (Please refer to evaluation for details):  ADL's;IADL's;Rest and Sleep;Play;Leisure;Social Participation;Other   body image   Rehab Potential  Fair    Current Impairments/barriers affecting progress:  Pt presents with decreased prognosis 2/2 overall health status w/ multiple comorbidities and history of non-compliance with previous  lymphedema treatment.    OT Frequency  3x / week    OT Duration  12 weeks   and PRN   OT Treatment/Interventions  Self-care/ADL training;DME and/or AE instruction;Manual lymph drainage;Patient/family education;Compression bandaging;Therapeutic exercise;Therapeutic activities;Manual Therapy;Therapist, nutritional;Other (comment)    Plan  skin care to decrease infection risk and improve hydration    Clinical Decision Making  Multiple treatment options, significant modification of task necessary    Recommended Other Services  fit with adjustable , alternative compression wraps for optimal independence w/ donning and doffing    Consulted and Agree with Plan of Care  Patient;Family member/caregiver       Patient will benefit from skilled therapeutic intervention in order to improve the following deficits and impairments:  Decreased range of motion, Difficulty walking, Impaired flexibility, Increased edema, Decreased activity tolerance, Decreased skin integrity, Decreased balance, Decreased knowledge of use of DME, Decreased scar mobility, Pain, Decreased mobility, Decreased knowledge of precautions, Abnormal gait, Decreased cognition, Decreased endurance, Decreased safety awareness, Decreased strength,  Impaired vision/preception, Impaired perceived functional ability  Visit Diagnosis: Lymphedema, not elsewhere classified    Problem List Patient Active Problem List   Diagnosis Date Noted  . Chronic pain of multiple joints 08/01/2017  . Age-related osteoporosis without current pathological fracture 08/01/2017  . Left knee pain 09/19/2016  . Refractory anemia, unspecified ([place]) 08/10/2016  . Headache 06/22/2015  . Rectal benign neoplasm   . Asthma, chronic 04/04/2015  . history of Tubular adenoma of colon 04/04/2015  . 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 Hypokalemia 04/04/2015  . CKD (chronic kidney disease) stage 3, GFR 30-59 ml/min (HCC) 04/03/2015  . Dehydration 04/03/2015  . Skin ulcer ([place])   . History of colonic polyps 12/08/2014  . Edema 04/14/2014  . Chronic systolic heart failure ([place]) 11/20/2013  . Nausea with vomiting 11/13/2013  . Lymphedema of lower extremity 10/17/2013  . 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 MGUS (monoclonal gammopathy of unknown significance) 05/15/2012  . VITAMIN D DEFICIENCY 01/29/2010  . BURSITIS, LEFT ELBOW 01/29/2010  . DERMATITIS 09/30/2009  . DIABETES MELLITUS, BORDERLINE 01/11/2009  . Paranoid delusion ([place]) 08/02/2008  . Obstructive chronic bronchitis ([place]) 08/02/2008  . Essential hypertension 11/11/2007  . Venous (peripheral) insufficiency 11/11/2007  . RENAL CALCULUS 11/11/2007  . LOW BACK PAIN, CHRONIC 11/11/2007    Andrey Spearman, MS, OTR/L, [facility] 09/10/18 11:32 AM  [place] MAIN [facility] SERVICES [address] [place], [place], [address] Phone: [phone]   Fax:  [phone]  Name: ZEENAT JEANBAPTISTE MRN: [id number] Date of Birth: [birth year]

## 2018-09-12 ENCOUNTER — Ambulatory Visit: Payer: Medicare Other | Admitting: Occupational Therapy

## 2018-09-12 ENCOUNTER — Encounter: Payer: Self-pay | Admitting: Occupational Therapy

## 2018-09-12 DIAGNOSIS — I890 Lymphedema, not elsewhere classified: Secondary | ICD-10-CM | POA: Diagnosis not present

## 2018-09-12 NOTE — Therapy (Signed)
[name] [name] SERVICES [name] [place], [place], [address] Phone: [phone]   Fax:  [phone]  Occupational Therapy Treatment  Patient Details  Name: [name] MRN: [id number] Date of Birth: 23-Jan-1932 No data recorded  Encounter Date: 09/12/2018  OT End of Session - 09/12/18 1116    Visit Number  13    Number of Visits  36    Date for OT Re-Evaluation  10/20/18    OT Start Time  0950    OT Stop Time  1110    OT Time Calculation (min)  80 min    Activity Tolerance  Patient tolerated treatment well;No increased pain    Behavior During Therapy  WFL for tasks assessed/performed       Past Medical History:  Diagnosis Date  . Anemia, unspecified   . Anxiety   . Arthritis   . Asthma   . Delusions ([place])   . Depression   . Edema 2005   "since put the glass in them"   . GERD (gastroesophageal reflux disease)   . Lumbago   . Lymphedema   . Obstructive chronic bronchitis without exacerbation ([place])   . Other abnormal glucose   . 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 Systolic heart failure (HCC)    EF 35 to 40% per echo January 2015  . Unspecified essential hypertension   . Unspecified nonpsychotic mental disorder   . Unspecified venous (peripheral) insufficiency   . Unspecified venous (peripheral) insufficiency   . Unspecified vitamin D deficiency     Past Surgical History:  Procedure Laterality Date  . APPENDECTOMY    . CERVICAL SPINE SURGERY     x 2  . CHOLECYSTECTOMY    . ESOPHAGOGASTRODUODENOSCOPY N/A 04/05/2015   Procedure: ESOPHAGOGASTRODUODENOSCOPY (EGD);  Surgeon: [name], MD;  Location: [facility];  Service: Endoscopy;  Laterality: N/A;  . FLEXIBLE SIGMOIDOSCOPY N/A 04/05/2015   Procedure: FLEXIBLE SIGMOIDOSCOPY;  Surgeon: [name], MD;  Location[facility];  Service: Endoscopy;  Laterality: N/A;  . HAND SURGERY    . LUMBAR DISC SURGERY    . PARTIAL HYSTERECTOMY    . TONSILLECTOMY       There were no vitals filed for this visit.  Subjective Assessment - 09/12/18 1113    Subjective   [name] presents 10 minutes early for OT visit 13/36  to address BLE LE utilizing CDT.  Pt is accompanied by new private caregiver who transports her to clinic. CG tells me today that family is concerned about money, so asked her not to apply wraps to Pt at home  or between visits.    Patient is accompained by:  Family member    Pertinent History  Chronic bronchitis, CVI, anxiety , systoloc hert failure, asthma, arthritis, multiple spinal surgeries w/ chronic back pain    Limitations  Difficulty walking, decreased standing tolerance, impaired transfer ability, decreased ankle AROM, Pain in BLE, Inability to fit street shoes w/ increased falls risk, increased infection risk.    Patient Stated Goals  reduce swelling and legs feel better    Currently in Pain?  No/denies    Pain Onset  More than a month ago                   OT Treatments/Exercises (OP) - 09/12/18 0001      ADLs   ADL Education Given  Yes      Manual Therapy   Manual Therapy  Edema  management;Manual Lymphatic Drainage (MLD);Compression Bandaging    Manual therapy comments  skin care bilaterally below knees using prescribed Clotrimazole and Betamethasone cream    Manual Lymphatic Drainage (MLD)  MLD to RLE today    Compression Bandaging  multi layer compression wraps reapplied as established- 4 layers below the knee to the LLE and 3 layers  and 3 layers below knee to RLE.             OT Education - 09/12/18 1116    Education provided  Yes    Education Details  Continued skilled Pt/caregiver education  And LE ADL training throughout visit for lymphedema self care/ home program, including compression wrapping, compression garment and device wear/care, lymphatic pumping ther ex, simple self-MLD, and skin care. Discussed progress towards goals    Person(s) Educated  Patient;Caregiver(s)    Methods   Explanation;Demonstration;Handout    Comprehension  Verbalized understanding;Verbal cues required;Returned demonstration          OT Long Term Goals - 09/03/18 1542      OT LONG TERM GOAL #1   Title  Lymphedema (LE) self-care: Pt able to don/doff adjustable, alternative compression garments ( aka CircAif, Francia Greaves) between OT visits with Maximal caregiver assistance within 2 weeks to  facilitate improved prognosis and optimal volume reduction during Intensive CDT.    Baseline  dependent    Time  2    Period  Weeks    Status  Achieved      OT LONG TERM GOAL #2   Title  Pt to achieve at least 10% BLE limb volume reductions during Intensive CDT to limit LE progression, decrease infection risk, to reduce foot and leg pain, and to improve ability to fit lower body clothing and shoes.    Baseline  dependent    Time  12    Period  Weeks    Status  Partially Met      OT LONG TERM GOAL #3   Title  Pt >/= 85 % compliant with all daily, LE self-care protocols for home program, including simple self-manual lymphatic drainage (MLD), skin care, lymphatic pumping the ex, skin care, and donning/ doffing compression wraps and garments with needed level of caregiver assistance to limit LE progression and further functional decline.      Baseline  dependent    Time  12    Period  Weeks    Status  Partially Met      OT LONG TERM GOAL #4   Title  Lymphedema (LE) self-care : Pt >/= 85 % compliant with all daily LE self-care protocols, including simple self-manual lymphatic drainage (MLD), skin care, lymphatic pumping therex, and donning/ doffing progression garments with needed level of caregiver assistance to limit LE progression, infection risk and further functional decline.      Baseline  dependent    Time  12    Period  Weeks    Status  Partially Met      OT LONG TERM GOAL #5   Title  Pt to tolerate daily compression wraps, garments and devices in keeping w/ prescribed wear regime within 1  week of issue date to progress and retain clinical and functional gains and to limit LE progression.    Baseline  dependent    Time  12    Period  Weeks    Status  Partially Met            Plan - 09/12/18 1117    Clinical Impression Statement  Pt presents with wraps applied by CG after Tuesday's visit in place. Blood spotting on wraps most liikely due to Pt scrubbing legs too aggressively with washcloth. Legs are mildly more smollen today distally. Pt tolerated skin care , MLD and BLE compression therapies without difficulty . Cont as per POC.    Occupational Profile and client history currently impacting functional performance  dimensia, consistent CG assistance with removing wraps, providing skin care and reapplying compression between clinical visits. S    Occupational performance deficits (Please refer to evaluation for details):  ADL's;IADL's;Rest and Sleep;Play;Leisure;Social Participation;Other   body image   Rehab Potential  Fair    Current Impairments/barriers affecting progress:  Pt presents with decreased prognosis 2/2 overall health status w/ multiple comorbidities and history of non-compliance with previous  lymphedema treatment.    OT Frequency  3x / week    OT Duration  12 weeks   and PRN   OT Treatment/Interventions  Self-care/ADL training;DME and/or AE instruction;Manual lymph drainage;Patient/family education;Compression bandaging;Therapeutic exercise;Therapeutic activities;Manual Therapy;Therapist, nutritional;Other (comment)    Plan  skin care to decrease infection risk and improve hydration    Clinical Decision Making  Multiple treatment options, significant modification of task necessary    Recommended Other Services  fit with adjustable , alternative compression wraps for optimal independence w/ donning and doffing    Consulted and Agree with Plan of Care  Patient;Family member/caregiver       Patient will benefit from skilled therapeutic intervention in  order to improve the following deficits and impairments:  Decreased range of motion, Difficulty walking, Impaired flexibility, Increased edema, Decreased activity tolerance, Decreased skin integrity, Decreased balance, Decreased knowledge of use of DME, Decreased scar mobility, Pain, Decreased mobility, Decreased knowledge of precautions, Abnormal gait, Decreased cognition, Decreased endurance, Decreased safety awareness, Decreased strength, Impaired vision/preception, Impaired perceived functional ability  Visit Diagnosis: No diagnosis found.    Problem List Patient Active Problem List   Diagnosis Date Noted  . Chronic pain of multiple joints 08/01/2017  . Age-related osteoporosis without current pathological fracture 08/01/2017  . Left knee pain 09/19/2016  . Refractory anemia, unspecified ([place]) 08/10/2016  . Headache 06/22/2015  . Rectal benign neoplasm   . Asthma, chronic 04/04/2015  . history of Tubular adenoma of colon 04/04/2015  . 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 Hypokalemia 04/04/2015  . CKD (chronic kidney disease) stage 3, GFR 30-59 ml/min (HCC) 04/03/2015  . Dehydration 04/03/2015  . Skin ulcer ([place])   . History of colonic polyps 12/08/2014  . Edema 04/14/2014  . Chronic systolic heart failure ([place]) 11/20/2013  . Nausea with vomiting 11/13/2013  . Lymphedema of lower extremity 10/17/2013  . 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 MGUS (monoclonal gammopathy of unknown significance) 05/15/2012  . VITAMIN D DEFICIENCY 01/29/2010  . BURSITIS, LEFT ELBOW 01/29/2010  . DERMATITIS 09/30/2009  . DIABETES MELLITUS, BORDERLINE 01/11/2009  . Paranoid delusion ([place]) 08/02/2008  . Obstructive chronic bronchitis ([place]) 08/02/2008  . Essential hypertension 11/11/2007  . Venous (peripheral) insufficiency 11/11/2007  . RENAL CALCULUS 11/11/2007  . LOW BACK PAIN, CHRONIC 11/11/2007   Andrey Spearman, MS, OTR/L, [facility] 09/12/18 11:42 AM   [name] [facility] SERVICES [address] [place], [place], [address] Phone: [phone]   Fax:  [phone]  Name: NILAM QUAKENBUSH MRN: [id number] Date of Birth: [birth year]

## 2018-09-16 ENCOUNTER — Ambulatory Visit: Payer: Medicare Other | Admitting: Occupational Therapy

## 2018-09-16 ENCOUNTER — Encounter: Payer: Self-pay | Admitting: Occupational Therapy

## 2018-09-17 ENCOUNTER — Encounter: Payer: Self-pay | Admitting: Occupational Therapy

## 2018-09-18 ENCOUNTER — Ambulatory Visit: Payer: Medicare Other | Admitting: Occupational Therapy

## 2018-09-19 ENCOUNTER — Encounter: Payer: Self-pay | Admitting: Occupational Therapy

## 2018-09-19 ENCOUNTER — Ambulatory Visit: Payer: Medicare Other | Admitting: Occupational Therapy

## 2018-09-23 ENCOUNTER — Ambulatory Visit: Payer: Medicare Other | Admitting: Occupational Therapy

## 2018-09-23 ENCOUNTER — Encounter: Payer: Self-pay | Admitting: Occupational Therapy

## 2018-09-24 ENCOUNTER — Ambulatory Visit: Payer: Medicare Other | Admitting: Occupational Therapy

## 2018-09-24 ENCOUNTER — Encounter: Payer: Self-pay | Admitting: Occupational Therapy

## 2018-09-24 DIAGNOSIS — I890 Lymphedema, not elsewhere classified: Secondary | ICD-10-CM

## 2018-09-25 NOTE — Therapy (Signed)
[place] MAIN [facility] SERVICES [address] [place], [place], [address] Phone: [phone]   Fax:  [phone]  Occupational Therapy Treatment  Patient Details  Name: [name] MRN: [id number] Date of Birth: [birth year] No data recorded  Encounter Date: 09/24/2018  OT End of Session - 09/24/18 1205    Visit Number  14    Number of Visits  36    Date for OT Re-Evaluation  10/20/18    OT Start Time  1025    OT Stop Time  1107    OT Time Calculation (min)  42 min    Activity Tolerance  Patient tolerated treatment well;No increased pain    Behavior During Therapy  WFL for tasks assessed/performed       Past Medical History:  Diagnosis Date  . Anemia, unspecified   . Anxiety   . Arthritis   . Asthma   . Delusions ([place])   . Depression   . Edema 2005   "since put the glass in them"   . GERD (gastroesophageal reflux disease)   . Lumbago   . Lymphedema   . Obstructive chronic bronchitis without exacerbation ([place])   . Other abnormal glucose   . 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 Systolic heart failure (HCC)    EF 35 to 40% per echo January 2015  . Unspecified essential hypertension   . Unspecified nonpsychotic mental disorder   . Unspecified venous (peripheral) insufficiency   . Unspecified venous (peripheral) insufficiency   . Unspecified vitamin D deficiency     Past Surgical History:  Procedure Laterality Date  . APPENDECTOMY    . CERVICAL SPINE SURGERY     x 2  . CHOLECYSTECTOMY    . ESOPHAGOGASTRODUODENOSCOPY N/A 04/05/2015   Procedure: ESOPHAGOGASTRODUODENOSCOPY (EGD);  Surgeon: [name], MD;  Location: [facility];  Service: Endoscopy;  Laterality: N/A;  . FLEXIBLE SIGMOIDOSCOPY N/A 04/05/2015   Procedure: FLEXIBLE SIGMOIDOSCOPY;  Surgeon: [name], MD;  Location: [facility];  Service: Endoscopy;  Laterality: N/A;  . HAND SURGERY    . LUMBAR DISC SURGERY    . PARTIAL HYSTERECTOMY    . TONSILLECTOMY       There were no vitals filed for this visit.  Subjective Assessment - 09/25/18 1206    Subjective   [name] presents 25 minutes early for OT visit 14/36  to address BLE LE utilizing CDT.  Pt is accompanied private caregiver who transports her to clinic. pT WAS LAST SEEN FOR ot ON 09/12/18. sHE HAS MISSED SEVERAL VIVITS DUE TO ILLNESS.  Caregiver reports she assisted Pt with compression wraps one time over interval. Pt states she left wraps in place for several days.     Patient is accompained by:  Family member    Pertinent History  Chronic bronchitis, CVI, anxiety , systoloc hert failure, asthma, arthritis, multiple spinal surgeries w/ chronic back pain    Limitations  Difficulty walking, decreased standing tolerance, impaired transfer ability, decreased ankle AROM, Pain in BLE, Inability to fit street shoes w/ increased falls risk, increased infection risk.    Patient Stated Goals  reduce swelling and legs feel better    Currently in Pain?  Yes    Pain Score  --   leg pain not rated numerically   Pain Location  Leg    Pain Descriptors / Indicators  Heaviness;Tender;Discomfort;Tightness;Sore;Tiring;Pressure    Pain Type  Chronic pain    Pain Onset  More than a month ago  OT Treatments/Exercises (OP) - 09/25/18 0001      ADLs   ADL Education Given  Yes      Manual Therapy   Manual Therapy  Edema management;Manual Lymphatic Drainage (MLD);Compression Bandaging    Manual therapy comments  skin care bilaterally below knees using prescribed Clotrimazole and Betamethasone cream    Manual Lymphatic Drainage (MLD)  MLD to RLE today    Compression Bandaging  multi layer compression wraps reapplied as established- 4 layers below the knee to the LLE and 3 layers  and 3 layers below knee to RLE.             OT Education - 09/24/18 1205    Education provided  Yes    Education Details  Continued skilled Pt/caregiver education  And LE ADL training  throughout visit for lymphedema self care/ home program, including compression wrapping, compression garment and device wear/care, lymphatic pumping ther ex, simple self-MLD, and skin care. Discussed progress towards goals    Person(s) Educated  Patient;Caregiver(s)    Methods  Explanation;Demonstration;Handout    Comprehension  Verbalized understanding;Verbal cues required;Returned demonstration          OT Long Term Goals - 09/03/18 1542      OT LONG TERM GOAL #1   Title  Lymphedema (LE) self-care: Pt able to don/doff adjustable, alternative compression garments ( aka CircAif, Francia Greaves) between OT visits with Maximal caregiver assistance within 2 weeks to  facilitate improved prognosis and optimal volume reduction during Intensive CDT.    Baseline  dependent    Time  2    Period  Weeks    Status  Achieved      OT LONG TERM GOAL #2   Title  Pt to achieve at least 10% BLE limb volume reductions during Intensive CDT to limit LE progression, decrease infection risk, to reduce foot and leg pain, and to improve ability to fit lower body clothing and shoes.    Baseline  dependent    Time  12    Period  Weeks    Status  Partially Met      OT LONG TERM GOAL #3   Title  Pt >/= 85 % compliant with all daily, LE self-care protocols for home program, including simple self-manual lymphatic drainage (MLD), skin care, lymphatic pumping the ex, skin care, and donning/ doffing compression wraps and garments with needed level of caregiver assistance to limit LE progression and further functional decline.      Baseline  dependent    Time  12    Period  Weeks    Status  Partially Met      OT LONG TERM GOAL #4   Title  Lymphedema (LE) self-care : Pt >/= 85 % compliant with all daily LE self-care protocols, including simple self-manual lymphatic drainage (MLD), skin care, lymphatic pumping therex, and donning/ doffing progression garments with needed level of caregiver assistance to limit LE  progression, infection risk and further functional decline.      Baseline  dependent    Time  12    Period  Weeks    Status  Partially Met      OT LONG TERM GOAL #5   Title  Pt to tolerate daily compression wraps, garments and devices in keeping w/ prescribed wear regime within 1 week of issue date to progress and retain clinical and functional gains and to limit LE progression.    Baseline  dependent    Time  12  Period  Weeks    Status  Partially Met            Plan - 09/24/18 1213    Clinical Impression Statement  Pt returns today after missing several visits due to illness. Skin condition is well controlled today. Despite repeated discussions re the importance of removing compression wraps daily to perform skin care and bathing, Pt consistently leaves wraps in place for several days when  family caregivers are not available to assist. OT repeated these important instructions again today in an effort to limit skin issues and/or infection risk. Provided MLD, skin care and  compression bilaterally today. DME vendor is researching garment benefits. We won't reduce treatment frequency at this point as Pt has mssed so many Rx sessions during this CDT course. Cont as per POC.    Occupational Profile and client history currently impacting functional performance  dimensia, consistent CG assistance with removing wraps, providing skin care and reapplying compression between clinical visits. S    Occupational performance deficits (Please refer to evaluation for details):  ADL's;IADL's;Rest and Sleep;Play;Leisure;Social Participation;Other   body image   Rehab Potential  Fair    Current Impairments/barriers affecting progress:  Pt presents with decreased prognosis 2/2 overall health status w/ multiple comorbidities and history of non-compliance with previous  lymphedema treatment.    OT Frequency  3x / week    OT Duration  12 weeks   and PRN   OT Treatment/Interventions  Self-care/ADL  training;DME and/or AE instruction;Manual lymph drainage;Patient/family education;Compression bandaging;Therapeutic exercise;Therapeutic activities;Manual Therapy;Therapist, nutritional;Other (comment)    Plan  skin care to decrease infection risk and improve hydration    Clinical Decision Making  Multiple treatment options, significant modification of task necessary    Recommended Other Services  fit with adjustable , alternative compression wraps for optimal independence w/ donning and doffing    Consulted and Agree with Plan of Care  Patient;Family member/caregiver       Patient will benefit from skilled therapeutic intervention in order to improve the following deficits and impairments:  Decreased range of motion, Difficulty walking, Impaired flexibility, Increased edema, Decreased activity tolerance, Decreased skin integrity, Decreased balance, Decreased knowledge of use of DME, Decreased scar mobility, Pain, Decreased mobility, Decreased knowledge of precautions, Abnormal gait, Decreased cognition, Decreased endurance, Decreased safety awareness, Decreased strength, Impaired vision/preception, Impaired perceived functional ability  Visit Diagnosis: Lymphedema, not elsewhere classified    Problem List Patient Active Problem List   Diagnosis Date Noted  . Chronic pain of multiple joints 08/01/2017  . Age-related osteoporosis without current pathological fracture 08/01/2017  . Left knee pain 09/19/2016  . Refractory anemia, unspecified ([place]) 08/10/2016  . Headache 06/22/2015  . Rectal benign neoplasm   . Asthma, chronic 04/04/2015  . history of Tubular adenoma of colon 04/04/2015  . 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 Hypokalemia 04/04/2015  . CKD (chronic kidney disease) stage 3, GFR 30-59 ml/min (HCC) 04/03/2015  . Dehydration 04/03/2015  . Skin ulcer ([place])   . History of colonic polyps 12/08/2014  . Edema 04/14/2014  . Chronic systolic heart failure ([place])  11/20/2013  . Nausea with vomiting 11/13/2013  . Lymphedema of lower extremity 10/17/2013  . 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 MGUS (monoclonal gammopathy of unknown significance) 05/15/2012  . VITAMIN D DEFICIENCY 01/29/2010  . BURSITIS, LEFT ELBOW 01/29/2010  . DERMATITIS 09/30/2009  . DIABETES MELLITUS, BORDERLINE 01/11/2009  . Paranoid delusion ([place]) 08/02/2008  . Obstructive chronic bronchitis ([place]) 08/02/2008  . Essential hypertension  11/11/2007  . Venous (peripheral) insufficiency 11/11/2007  . RENAL CALCULUS 11/11/2007  . LOW BACK PAIN, CHRONIC 11/11/2007    Andrey Spearman, MS, OTR/L, [facility] 09/25/18 12:19 PM  [place] MAIN [facility] SERVICES [address] [place], [place], [address] Phone: [phone]   Fax:  [phone]  Name: [name] MRN: [id number] Date of Birth: 1931/12/27

## 2018-09-25 NOTE — Patient Instructions (Signed)
Reiterated again for Pt and caregiver the importance of removing compression wraps EVERY 24 hours to inspect skin , bathe , and perform skin care to limit infection.

## 2018-09-30 ENCOUNTER — Encounter: Payer: Self-pay | Admitting: Occupational Therapy

## 2018-09-30 ENCOUNTER — Ambulatory Visit: Payer: Medicare Other | Attending: Internal Medicine | Admitting: Occupational Therapy

## 2018-09-30 DIAGNOSIS — I890 Lymphedema, not elsewhere classified: Secondary | ICD-10-CM | POA: Diagnosis not present

## 2018-10-01 ENCOUNTER — Ambulatory Visit: Payer: Medicare Other | Admitting: Occupational Therapy

## 2018-10-01 ENCOUNTER — Encounter: Payer: Self-pay | Admitting: Occupational Therapy

## 2018-10-01 DIAGNOSIS — I890 Lymphedema, not elsewhere classified: Secondary | ICD-10-CM | POA: Diagnosis not present

## 2018-10-01 NOTE — Therapy (Signed)
[place] MAIN [facility] SERVICES [address] [place], [place], [address] Phone: [phone]   Fax:  [phone]  Occupational Therapy Treatment  Patient Details  Name: [name] MRN: [id number] Date of Birth: 06-12-1932 No data recorded  Encounter Date: 09/30/2018  OT End of Session - 09/30/18 1259    Visit Number  15    Number of Visits  36    Date for OT Re-Evaluation  10/20/18    OT Start Time  1010    OT Stop Time  1116    OT Time Calculation (min)  66 min    Activity Tolerance  Patient tolerated treatment well;No increased pain    Behavior During Therapy  WFL for tasks assessed/performed       Past Medical History:  Diagnosis Date  . Anemia, unspecified   . Anxiety   . Arthritis   . Asthma   . Delusions ([place])   . Depression   . Edema 2005   "since put the glass in them"   . GERD (gastroesophageal reflux disease)   . Lumbago   . Lymphedema   . Obstructive chronic bronchitis without exacerbation ([place])   . Other abnormal glucose   . 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 Systolic heart failure (HCC)    EF 35 to 40% per echo January 2015  . Unspecified essential hypertension   . Unspecified nonpsychotic mental disorder   . Unspecified venous (peripheral) insufficiency   . Unspecified venous (peripheral) insufficiency   . Unspecified vitamin D deficiency     Past Surgical History:  Procedure Laterality Date  . APPENDECTOMY    . CERVICAL SPINE SURGERY     x 2  . CHOLECYSTECTOMY    . ESOPHAGOGASTRODUODENOSCOPY N/A 04/05/2015   Procedure: ESOPHAGOGASTRODUODENOSCOPY (EGD);  Surgeon: [name], MD;  Location: [facility];  Service: Endoscopy;  Laterality: N/A;  . FLEXIBLE SIGMOIDOSCOPY N/A 04/05/2015   Procedure: FLEXIBLE SIGMOIDOSCOPY;  Surgeon: [name], MD;  Location: [facility];  Service: Endoscopy;  Laterality: N/A;  . HAND SURGERY    . LUMBAR DISC SURGERY    . PARTIAL HYSTERECTOMY    . TONSILLECTOMY       There were no vitals filed for this visit.  Subjective Assessment - 09/30/18 1154    Subjective   [name] presents for OT visit 16/36  to address BLE LE utilizing CDT.  Pt is accompanied private caregiver. Pt reports she left compression wraps applied before thanksgiving holiday in place until yesterday. Pt reports she did not have family or caregiver suppoort to re-apply compression wraps during visit interval    Patient is accompained by:  Family member    Pertinent History  Chronic bronchitis, CVI, anxiety , systoloc hert failure, asthma, arthritis, multiple spinal surgeries w/ chronic back pain    Limitations  Difficulty walking, decreased standing tolerance, impaired transfer ability, decreased ankle AROM, Pain in BLE, Inability to fit street shoes w/ increased falls risk, increased infection risk.    Patient Stated Goals  reduce swelling and legs feel better    Pain Onset  More than a month ago                           OT Education - 09/30/18 1258    Education provided  Yes    Education Details  Continued skilled Pt/caregiver education  And LE ADL training throughout visit for lymphedema self care/ home program, including  compression wrapping, compression garment and device wear/care, lymphatic pumping ther ex, simple self-MLD, and skin care. Discussed progress towards goals    Person(s) Educated  Patient;Caregiver(s)    Methods  Explanation;Demonstration;Handout    Comprehension  Verbalized understanding;Verbal cues required;Returned demonstration          OT Long Term Goals - 09/03/18 1542      OT LONG TERM GOAL #1   Title  Lymphedema (LE) self-care: Pt able to don/doff adjustable, alternative compression garments ( aka CircAif, Francia Greaves) between OT visits with Maximal caregiver assistance within 2 weeks to  facilitate improved prognosis and optimal volume reduction during Intensive CDT.    Baseline  dependent    Time  2    Period  Weeks     Status  Achieved      OT LONG TERM GOAL #2   Title  Pt to achieve at least 10% BLE limb volume reductions during Intensive CDT to limit LE progression, decrease infection risk, to reduce foot and leg pain, and to improve ability to fit lower body clothing and shoes.    Baseline  dependent    Time  12    Period  Weeks    Status  Partially Met      OT LONG TERM GOAL #3   Title  Pt >/= 85 % compliant with all daily, LE self-care protocols for home program, including simple self-manual lymphatic drainage (MLD), skin care, lymphatic pumping the ex, skin care, and donning/ doffing compression wraps and garments with needed level of caregiver assistance to limit LE progression and further functional decline.      Baseline  dependent    Time  12    Period  Weeks    Status  Partially Met      OT LONG TERM GOAL #4   Title  Lymphedema (LE) self-care : Pt >/= 85 % compliant with all daily LE self-care protocols, including simple self-manual lymphatic drainage (MLD), skin care, lymphatic pumping therex, and donning/ doffing progression garments with needed level of caregiver assistance to limit LE progression, infection risk and further functional decline.      Baseline  dependent    Time  12    Period  Weeks    Status  Partially Met      OT LONG TERM GOAL #5   Title  Pt to tolerate daily compression wraps, garments and devices in keeping w/ prescribed wear regime within 1 week of issue date to progress and retain clinical and functional gains and to limit LE progression.    Baseline  dependent    Time  12    Period  Weeks    Status  Partially Met            Plan - 09/30/18 1300    Clinical Impression Statement  Pt reportedly did not change compression wraps from 11/27 when applied in clinic until yesterday. Pt reportedly had no assistance with compression changes and skin care during visit interval. Petichiae is observed in pattern of compression wraps ffalling downward into folds aruond  ankles and foot swelling is more pronounced today. Without  daily skin inspection, skin care and compression changes Pt is at [facility] risk for skin breakdown, infection and poor swelling reduction. OT has discussed this with family caregivers repeatedly. Will discuss frankly again at upcoming visit with family member present and facilitate an actio0n plan. Pt tolerated MLD, skin care, and compression today with tenderness at distal legs and feet.  Occupational Profile and client history currently impacting functional performance  dimensia, consistent CG assistance with removing wraps, providing skin care and reapplying compression between clinical visits. S    Occupational performance deficits (Please refer to evaluation for details):  ADL's;IADL's;Rest and Sleep;Play;Leisure;Social Participation;Other   body image   Rehab Potential  Fair    Current Impairments/barriers affecting progress:  Pt presents with decreased prognosis 2/2 overall health status w/ multiple comorbidities and history of non-compliance with previous  lymphedema treatment.    OT Frequency  3x / week    OT Duration  12 weeks   and PRN   OT Treatment/Interventions  Self-care/ADL training;DME and/or AE instruction;Manual lymph drainage;Patient/family education;Compression bandaging;Therapeutic exercise;Therapeutic activities;Manual Therapy;Therapist, nutritional;Other (comment)    Plan  skin care to decrease infection risk and improve hydration    Clinical Decision Making  Multiple treatment options, significant modification of task necessary    Recommended Other Services  fit with adjustable , alternative compression wraps for optimal independence w/ donning and doffing    Consulted and Agree with Plan of Care  Patient;Family member/caregiver       Patient will benefit from skilled therapeutic intervention in order to improve the following deficits and impairments:  Decreased range of motion, Difficulty walking, Impaired  flexibility, Increased edema, Decreased activity tolerance, Decreased skin integrity, Decreased balance, Decreased knowledge of use of DME, Decreased scar mobility, Pain, Decreased mobility, Decreased knowledge of precautions, Abnormal gait, Decreased cognition, Decreased endurance, Decreased safety awareness, Decreased strength, Impaired vision/preception, Impaired perceived functional ability  Visit Diagnosis: Lymphedema, not elsewhere classified    Problem List Patient Active Problem List   Diagnosis Date Noted  . Chronic pain of multiple joints 08/01/2017  . Age-related osteoporosis without current pathological fracture 08/01/2017  . Left knee pain 09/19/2016  . Refractory anemia, unspecified ([place]) 08/10/2016  . Headache 06/22/2015  . Rectal benign neoplasm   . Asthma, chronic 04/04/2015  . history of Tubular adenoma of colon 04/04/2015  . 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 Hypokalemia 04/04/2015  . CKD (chronic kidney disease) stage 3, GFR 30-59 ml/min (HCC) 04/03/2015  . Dehydration 04/03/2015  . Skin ulcer ([place])   . History of colonic polyps 12/08/2014  . Edema 04/14/2014  . Chronic systolic heart failure ([place]) 11/20/2013  . Nausea with vomiting 11/13/2013  . Lymphedema of lower extremity 10/17/2013  . 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 MGUS (monoclonal gammopathy of unknown significance) 05/15/2012  . VITAMIN D DEFICIENCY 01/29/2010  . BURSITIS, LEFT ELBOW 01/29/2010  . DERMATITIS 09/30/2009  . DIABETES MELLITUS, BORDERLINE 01/11/2009  . Paranoid delusion ([place]) 08/02/2008  . Obstructive chronic bronchitis ([place]) 08/02/2008  . Essential hypertension 11/11/2007  . Venous (peripheral) insufficiency 11/11/2007  . RENAL CALCULUS 11/11/2007  . LOW BACK PAIN, CHRONIC 11/11/2007    Andrey Spearman, MS, OTR/L, [facility] 10/01/18 1:06 PM  [place] MAIN [facility] SERVICES [address] [place], [place], [address] Phone:  [phone]   Fax:  [phone]  Name: [name] MRN: [id number] Date of Birth: [birth year]

## 2018-10-01 NOTE — Therapy (Signed)
[place] MAIN [facility] SERVICES [address] [place], [place], [address] Phone: [phone]   Fax:  [phone]  Occupational Therapy Treatment  Patient Details  Name: [name] MRN: [id number] Date of Birth: 08/12/1932 No data recorded  Encounter Date: 10/01/2018  OT End of Session - 10/01/18 1602    Visit Number  16    Number of Visits  36    Date for OT Re-Evaluation  10/20/18    OT Start Time  1001    OT Stop Time  1110    OT Time Calculation (min)  69 min    Activity Tolerance  Patient tolerated treatment well;No increased pain    Behavior During Therapy  WFL for tasks assessed/performed       Past Medical History:  Diagnosis Date  . Anemia, unspecified   . Anxiety   . Arthritis   . Asthma   . Delusions ([place])   . Depression   . Edema 2005   "since put the glass in them"   . GERD (gastroesophageal reflux disease)   . Lumbago   . Lymphedema   . Obstructive chronic bronchitis without exacerbation ([place])   . Other abnormal glucose   . 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[place]) 04/04/2013  . Systolic heart failure (HCC)    EF 35 to 40% per echo January 2015  . Unspecified essential hypertension   . Unspecified nonpsychotic mental disorder   . Unspecified venous (peripheral) insufficiency   . Unspecified venous (peripheral) insufficiency   . Unspecified vitamin D deficiency     Past Surgical History:  Procedure Laterality Date  . APPENDECTOMY    . CERVICAL SPINE SURGERY     x 2  . CHOLECYSTECTOMY    . ESOPHAGOGASTRODUODENOSCOPY N/A 04/05/2015   Procedure: ESOPHAGOGASTRODUODENOSCOPY (EGD);  Surgeon: [name], MD;  Location: [facility];  Service: Endoscopy;  Laterality: N/A;  . FLEXIBLE SIGMOIDOSCOPY N/A 04/05/2015   Procedure: FLEXIBLE SIGMOIDOSCOPY;  Surgeon: [name], MD;  Location: [facility];  Service: Endoscopy;  Laterality: N/A;  . HAND SURGERY    . LUMBAR DISC SURGERY    . PARTIAL HYSTERECTOMY    . TONSILLECTOMY       There were no vitals filed for this visit.  Subjective Assessment - 10/01/18 1016    Subjective   [name] presents for OT visit 16/36  to address BLE LE utilizing CDT.  Pt is accompanied private caregiver. Pt was able to tolerate compression wraps applied to BLE yesterday without difficulty.    Patient is accompained by:  Family member    Pertinent History  Chronic bronchitis, CVI, anxiety , systoloc hert failure, asthma, arthritis, multiple spinal surgeries w/ chronic back pain    Limitations  Difficulty walking, decreased standing tolerance, impaired transfer ability, decreased ankle AROM, Pain in BLE, Inability to fit street shoes w/ increased falls risk, increased infection risk.    Patient Stated Goals  reduce swelling and legs feel better    Currently in Pain?  Yes    Pain Orientation  Left;Right    Pain Descriptors / Indicators  Tender;Heaviness;Tightness;Sore;Pressure;Tiring    Pain Type  Chronic pain    Pain Onset  More than a month ago                   OT Treatments/Exercises (OP) - 10/01/18 0001      ADLs   ADL Education Given  Yes  (Pended)       Manual Therapy  Manual Therapy  Edema management;Manual Lymphatic Drainage (MLD);Compression Bandaging  (Pended)     Manual therapy comments  skin care bilaterally below knees using prescribed Clotrimazole and Betamethasone cream  (Pended)     Manual Lymphatic Drainage (MLD)  MLD to RLE today  (Pended)     Compression Bandaging  multi layer compression wraps reapplied as established- 4 layers below the knee to the LLE and 3 layers  and 3 layers below knee to RLE.  (Pended)              OT Education - 10/01/18 1603    Education provided  Yes    Education Details  Continued skilled Pt/caregiver education  And LE ADL training throughout visit for lymphedema self care/ home program, including compression wrapping, compression garment and device wear/care, lymphatic pumping ther ex, simple self-MLD, and  skin care. Discussed progress towards goals    Person(s) Educated  Patient;Caregiver(s)    Methods  Explanation;Demonstration;Handout    Comprehension  Verbalized understanding;Verbal cues required;Returned demonstration          OT Long Term Goals - 09/03/18 1542      OT LONG TERM GOAL #1   Title  Lymphedema (LE) self-care: Pt able to don/doff adjustable, alternative compression garments ( aka CircAif, Francia Greaves) between OT visits with Maximal caregiver assistance within 2 weeks to  facilitate improved prognosis and optimal volume reduction during Intensive CDT.    Baseline  dependent    Time  2    Period  Weeks    Status  Achieved      OT LONG TERM GOAL #2   Title  Pt to achieve at least 10% BLE limb volume reductions during Intensive CDT to limit LE progression, decrease infection risk, to reduce foot and leg pain, and to improve ability to fit lower body clothing and shoes.    Baseline  dependent    Time  12    Period  Weeks    Status  Partially Met      OT LONG TERM GOAL #3   Title  Pt >/= 85 % compliant with all daily, LE self-care protocols for home program, including simple self-manual lymphatic drainage (MLD), skin care, lymphatic pumping the ex, skin care, and donning/ doffing compression wraps and garments with needed level of caregiver assistance to limit LE progression and further functional decline.      Baseline  dependent    Time  12    Period  Weeks    Status  Partially Met      OT LONG TERM GOAL #4   Title  Lymphedema (LE) self-care : Pt >/= 85 % compliant with all daily LE self-care protocols, including simple self-manual lymphatic drainage (MLD), skin care, lymphatic pumping therex, and donning/ doffing progression garments with needed level of caregiver assistance to limit LE progression, infection risk and further functional decline.      Baseline  dependent    Time  12    Period  Weeks    Status  Partially Met      OT LONG TERM GOAL #5   Title  Pt to  tolerate daily compression wraps, garments and devices in keeping w/ prescribed wear regime within 1 week of issue date to progress and retain clinical and functional gains and to limit LE progression.    Baseline  dependent    Time  12    Period  Weeks    Status  Partially Met  Plan - 10/01/18 1604    Clinical Impression Statement  Mrs. Candela's legs appear reduced in volume and with improved skin condition today with 24 hr wraps since seen yesterday. If consistent caregiver assistance was availabel Pt's prognosis would be much improved and ZOT fdeuration would be reduced. Pt tolerated MLD, skin care and compression therapy throughout session today without difficulty. Plan to discuss pl of care going forward with family at next visit We continue to await DME vendor information on insurance benefits for recommended compression . [name]    Occupational Profile and client history currently impacting functional performance  dimensia, consistent CG assistance with removing wraps, providing skin care and reapplying compression between clinical visits. S    Occupational performance deficits (Please refer to evaluation for details):  ADL's;IADL's;Rest and Sleep;Play;Leisure;Social Participation;Other   body image   Rehab Potential  Fair    Current Impairments/barriers affecting progress:  Pt presents with decreased prognosis 2/2 overall health status w/ multiple comorbidities and history of non-compliance with previous  lymphedema treatment.    OT Frequency  3x / week    OT Duration  12 weeks   and PRN   OT Treatment/Interventions  Self-care/ADL training;DME and/or AE instruction;Manual lymph drainage;Patient/family education;Compression bandaging;Therapeutic exercise;Therapeutic activities;Manual Therapy;Therapist, nutritional;Other (comment)    Plan  skin care to decrease infection risk and improve hydration    Clinical Decision Making  Multiple treatment options, significant  modification of task necessary    Recommended Other Services  fit with adjustable , alternative compression wraps for optimal independence w/ donning and doffing    Consulted and Agree with Plan of Care  Patient;Family member/caregiver       Patient will benefit from skilled therapeutic intervention in order to improve the following deficits and impairments:  Decreased range of motion, Difficulty walking, Impaired flexibility, Increased edema, Decreased activity tolerance, Decreased skin integrity, Decreased balance, Decreased knowledge of use of DME, Decreased scar mobility, Pain, Decreased mobility, Decreased knowledge of precautions, Abnormal gait, Decreased cognition, Decreased endurance, Decreased safety awareness, Decreased strength, Impaired vision/preception, Impaired perceived functional ability  Visit Diagnosis: Lymphedema, not elsewhere classified    Problem List Patient Active Problem List   Diagnosis Date Noted  . Chronic pain of multiple joints 08/01/2017  . Age-related osteoporosis without current pathological fracture 08/01/2017  . Left knee pain 09/19/2016  . Refractory anemia, unspecified ([place]) 08/10/2016  . Headache 06/22/2015  . Rectal benign neoplasm   . Asthma, chronic 04/04/2015  . history of Tubular adenoma of colon 04/04/2015  . 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 Hypokalemia 04/04/2015  . CKD (chronic kidney disease) stage 3, GFR 30-59 ml/min (HCC) 04/03/2015  . Dehydration 04/03/2015  . Skin ulcer ([place])   . History of colonic polyps 12/08/2014  . Edema 04/14/2014  . Chronic systolic heart failure ([place]) 11/20/2013  . Nausea with vomiting 11/13/2013  . Lymphedema of lower extremity 10/17/2013  . 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 MGUS (monoclonal gammopathy of unknown significance) 05/15/2012  . VITAMIN D DEFICIENCY 01/29/2010  . BURSITIS, LEFT ELBOW 01/29/2010  . DERMATITIS 09/30/2009  . DIABETES MELLITUS, BORDERLINE 01/11/2009  .  Paranoid delusion ([place]) 08/02/2008  . Obstructive chronic bronchitis ([place]) 08/02/2008  . Essential hypertension 11/11/2007  . Venous (peripheral) insufficiency 11/11/2007  . RENAL CALCULUS 11/11/2007  . LOW BACK PAIN, CHRONIC 11/11/2007    Andrey Spearman, MS, OTR/L, [facility] 10/01/18 4:08 PM   [place] MAIN [facility] SERVICES [address] [place], [place], [address] Phone:  [phone]   Fax:  [phone]  Name: [name] MRN: [id number] Date of Birth: [birth year]

## 2018-10-03 ENCOUNTER — Ambulatory Visit: Payer: Medicare Other | Admitting: Occupational Therapy

## 2018-10-03 ENCOUNTER — Encounter: Payer: Self-pay | Admitting: Occupational Therapy

## 2018-10-03 DIAGNOSIS — I890 Lymphedema, not elsewhere classified: Secondary | ICD-10-CM

## 2018-10-03 NOTE — Therapy (Signed)
[place] MAIN [facility] SERVICES [address] [place], [place], [address] Phone: [phone]   Fax:  [phone]  Occupational Therapy Treatment  Patient Details  Name: [name] MRN: [id number] Date of Birth: 06-27-1932 No data recorded  Encounter Date: 10/03/2018  OT End of Session - 10/03/18 1135    Visit Number  17    Number of Visits  36    Date for OT Re-Evaluation  10/20/18    OT Start Time  1003    OT Stop Time  1115    OT Time Calculation (min)  72 min    Activity Tolerance  Patient tolerated treatment well;No increased pain    Behavior During Therapy  WFL for tasks assessed/performed       Past Medical History:  Diagnosis Date  . Anemia, unspecified   . Anxiety   . Arthritis   . Asthma   . Delusions ([place])   . Depression   . Edema 2005   "since put the glass in them"   . GERD (gastroesophageal reflux disease)   . Lumbago   . Lymphedema   . Obstructive chronic bronchitis without exacerbation ([place])   . Other abnormal glucose   . 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 Systolic heart failure (HCC)    EF 35 to 40% per echo January 2015  . Unspecified essential hypertension   . Unspecified nonpsychotic mental disorder   . Unspecified venous (peripheral) insufficiency   . Unspecified venous (peripheral) insufficiency   . Unspecified vitamin D deficiency     Past Surgical History:  Procedure Laterality Date  . APPENDECTOMY    . CERVICAL SPINE SURGERY     x 2  . CHOLECYSTECTOMY    . ESOPHAGOGASTRODUODENOSCOPY N/A 04/05/2015   Procedure: ESOPHAGOGASTRODUODENOSCOPY (EGD);  Surgeon: [name], MD;  Location: [facility];  Service: Endoscopy;  Laterality: N/A;  . FLEXIBLE SIGMOIDOSCOPY N/A 04/05/2015   Procedure: FLEXIBLE SIGMOIDOSCOPY;  Surgeon: [name], MD;  Location: [facility];  Service: Endoscopy;  Laterality: N/A;  . HAND SURGERY    . LUMBAR DISC SURGERY    . PARTIAL HYSTERECTOMY    . TONSILLECTOMY       There were no vitals filed for this visit.  Subjective Assessment - 10/03/18 1009    Subjective   [name] presents for OT visit 17/36  to address BLE LE utilizing CDT.  Pt is accompanied by her nephew today. "My back hurts and my big towe (L) hurts." PT, OT and nephew, [name], had a frank discussion about  problems associated with leavng compression wraps in place for more than 1-2 days. Discussed worsened skin condition observed alst week and still today after several days without compression change.     Patient is accompained by:  Family member    Pertinent History  Chronic bronchitis, CVI, anxiety , systoloc hert failure, asthma, arthritis, multiple spinal surgeries w/ chronic back pain    Limitations  Difficulty walking, decreased standing tolerance, impaired transfer ability, decreased ankle AROM, Pain in BLE, Inability to fit street shoes w/ increased falls risk, increased infection risk.    Patient Stated Goals  reduce swelling and legs feel better    Currently in Pain?  Yes   unrated numerically   Pain Location  Leg    Pain Orientation  Right;Left    Pain Descriptors / Indicators  Heaviness;Tender;Discomfort;Sore;Tightness;Pressure;Tiring    Pain Onset  More than a month ago    Pain Frequency  Intermittent  OT Treatments/Exercises (OP) - 10/03/18 0001      ADLs   ADL Education Given  Yes      Manual Therapy   Manual Therapy  Edema management;Manual Lymphatic Drainage (MLD);Compression Bandaging    Manual therapy comments  skin care bilaterally below knees using prescribed Clotrimazole and Betamethasone cream    Manual Lymphatic Drainage (MLD)  MLD to RLE today    Compression Bandaging  multi layer compression wraps reapplied as established- 4 layers below the knee to the LLE and 3 layers  and 3 layers below knee to RLE.             OT Education - 10/03/18 1133    Education provided  Yes    Education Details  Pt and family education  re importance of compression wrap changes q 1-2 days to limit skin breakdown and infection risk.     Person(s) Educated  Patient;Caregiver(s)    Methods  Explanation;Demonstration;Handout    Comprehension  Verbalized understanding;Verbal cues required;Returned demonstration          OT Long Term Goals - 09/03/18 1542      OT LONG TERM GOAL #1   Title  Lymphedema (LE) self-care: Pt able to don/doff adjustable, alternative compression garments ( aka CircAif, Francia Greaves) between OT visits with Maximal caregiver assistance within 2 weeks to  facilitate improved prognosis and optimal volume reduction during Intensive CDT.    Baseline  dependent    Time  2    Period  Weeks    Status  Achieved      OT LONG TERM GOAL #2   Title  Pt to achieve at least 10% BLE limb volume reductions during Intensive CDT to limit LE progression, decrease infection risk, to reduce foot and leg pain, and to improve ability to fit lower body clothing and shoes.    Baseline  dependent    Time  12    Period  Weeks    Status  Partially Met      OT LONG TERM GOAL #3   Title  Pt >/= 85 % compliant with all daily, LE self-care protocols for home program, including simple self-manual lymphatic drainage (MLD), skin care, lymphatic pumping the ex, skin care, and donning/ doffing compression wraps and garments with needed level of caregiver assistance to limit LE progression and further functional decline.      Baseline  dependent    Time  12    Period  Weeks    Status  Partially Met      OT LONG TERM GOAL #4   Title  Lymphedema (LE) self-care : Pt >/= 85 % compliant with all daily LE self-care protocols, including simple self-manual lymphatic drainage (MLD), skin care, lymphatic pumping therex, and donning/ doffing progression garments with needed level of caregiver assistance to limit LE progression, infection risk and further functional decline.      Baseline  dependent    Time  12    Period  Weeks    Status   Partially Met      OT LONG TERM GOAL #5   Title  Pt to tolerate daily compression wraps, garments and devices in keeping w/ prescribed wear regime within 1 week of issue date to progress and retain clinical and functional gains and to limit LE progression.    Baseline  dependent    Time  12    Period  Weeks    Status  Partially Met  Plan - 10/03/18 1135    Clinical Impression Statement  [name] discussion with Pt and family member re critical need for compression wrap changes q 1-2 days with no longer interval to limit skin breakdown and infection risk. Team in agreement with plan to increase frequency of paid caregiver assistance between clinic visits. Also discusswed  need for frequent and regular caregiving during holidays to ensure Pt is able to sustain clinical gains. Skin condition is slightly improved today , but dense petichiae at L ankle persists. Provided BLE skin care to improve hydration. BLE swelling also markedly increased today, L>R. Provided MLDto LLE. skin care bilaterally and reapplied compression wraps in clinic today. Cont as per POC.    Occupational Profile and client history currently impacting functional performance  dimensia, inconsistent CG assistance with removing wraps, providing skin care and reapplying compression between clinical visits. S    Occupational performance deficits (Please refer to evaluation for details):  ADL's;IADL's;Rest and Sleep;Play;Leisure;Social Participation;Other   body image   Rehab Potential  Fair    Current Impairments/barriers affecting progress:  Pt presents with decreased prognosis 2/2 overall health status w/ multiple comorbidities and history of non-compliance with previous  lymphedema treatment.    OT Frequency  3x / week    OT Duration  12 weeks   and PRN   OT Treatment/Interventions  Self-care/ADL training;DME and/or AE instruction;Manual lymph drainage;Patient/family education;Compression bandaging;Therapeutic  exercise;Therapeutic activities;Manual Therapy;Therapist, nutritional;Other (comment)    Plan  skin care to decrease infection risk and improve hydration    Clinical Decision Making  Multiple treatment options, significant modification of task necessary    Recommended Other Services  fit with adjustable , alternative compression wraps for optimal independence w/ donning and doffing    Consulted and Agree with Plan of Care  Patient;Family member/caregiver       Patient will benefit from skilled therapeutic intervention in order to improve the following deficits and impairments:  Decreased range of motion, Difficulty walking, Impaired flexibility, Increased edema, Decreased activity tolerance, Decreased skin integrity, Decreased balance, Decreased knowledge of use of DME, Decreased scar mobility, Pain, Decreased mobility, Decreased knowledge of precautions, Abnormal gait, Decreased cognition, Decreased endurance, Decreased safety awareness, Decreased strength, Impaired vision/preception, Impaired perceived functional ability  Visit Diagnosis: Lymphedema, not elsewhere classified    Problem List Patient Active Problem List   Diagnosis Date Noted  . Chronic pain of multiple joints 08/01/2017  . Age-related osteoporosis without current pathological fracture 08/01/2017  . Left knee pain 09/19/2016  . Refractory anemia, unspecified ([place]) 08/10/2016  . Headache 06/22/2015  . Rectal benign neoplasm   . Asthma, chronic 04/04/2015  . history of Tubular adenoma of colon 04/04/2015  . 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 Hypokalemia 04/04/2015  . CKD (chronic kidney disease) stage 3, GFR 30-59 ml/min (HCC) 04/03/2015  . Dehydration 04/03/2015  . Skin ulcer ([place])   . History of colonic polyps 12/08/2014  . Edema 04/14/2014  . Chronic systolic heart failure ([place]) 11/20/2013  . Nausea with vomiting 11/13/2013  . Lymphedema of lower extremity 10/17/2013  . 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[place])  04/04/2013  . MGUS (monoclonal gammopathy of unknown significance) 05/15/2012  . VITAMIN D DEFICIENCY 01/29/2010  . BURSITIS, LEFT ELBOW 01/29/2010  . DERMATITIS 09/30/2009  . DIABETES MELLITUS, BORDERLINE 01/11/2009  . Paranoid delusion ([place]) 08/02/2008  . Obstructive chronic bronchitis ([place]) 08/02/2008  . Essential hypertension 11/11/2007  . Venous (peripheral) insufficiency 11/11/2007  . RENAL CALCULUS 11/11/2007  . LOW BACK PAIN, CHRONIC 11/11/2007  Andrey Spearman, MS, OTR/L, [facility] 10/03/18 11:43 AM   [place] MAIN [facility] SERVICES [place], [place], [address] Phone: [phone]   Fax:  [phone]  Name: [name] MRN: [id number] Date of Birth: 11-09-1931

## 2018-10-07 ENCOUNTER — Ambulatory Visit: Payer: Medicare Other | Admitting: Occupational Therapy

## 2018-10-07 ENCOUNTER — Encounter: Payer: Self-pay | Admitting: Occupational Therapy

## 2018-10-07 DIAGNOSIS — I890 Lymphedema, not elsewhere classified: Secondary | ICD-10-CM | POA: Diagnosis not present

## 2018-10-07 NOTE — Therapy (Signed)
[place] MAIN [facility] SERVICES [address] [place], [place], [address] Phone: [phone]   Fax:  [phone]  Occupational Therapy Treatment  Patient Details  Name: [name] MRN: [id number] Date of Birth: 1932-02-23 No data recorded  Encounter Date: 10/07/2018  OT End of Session - 10/07/18 1217    Visit Number  18    Number of Visits  36    Date for OT Re-Evaluation  10/20/18    OT Start Time  1032    OT Stop Time  1107    OT Time Calculation (min)  35 min    Activity Tolerance  Patient tolerated treatment well;No increased pain    Behavior During Therapy  WFL for tasks assessed/performed       Past Medical History:  Diagnosis Date  . Anemia, unspecified   . Anxiety   . Arthritis   . Asthma   . Delusions ([place])   . Depression   . Edema 2005   "since put the glass in them"   . GERD (gastroesophageal reflux disease)   . Lumbago   . Lymphedema   . Obstructive chronic bronchitis without exacerbation ([place])   . Other abnormal glucose   . 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[place]) 04/04/2013  . Systolic heart failure (HCC)    EF 35 to 40% per echo January 2015  . Unspecified essential hypertension   . Unspecified nonpsychotic mental disorder   . Unspecified venous (peripheral) insufficiency   . Unspecified venous (peripheral) insufficiency   . Unspecified vitamin D deficiency     Past Surgical History:  Procedure Laterality Date  . APPENDECTOMY    . CERVICAL SPINE SURGERY     x 2  . CHOLECYSTECTOMY    . ESOPHAGOGASTRODUODENOSCOPY N/A 04/05/2015   Procedure: ESOPHAGOGASTRODUODENOSCOPY (EGD);  Surgeon: [name], MD;  Location: [facility];  Service: Endoscopy;  Laterality: N/A;  . FLEXIBLE SIGMOIDOSCOPY N/A 04/05/2015   Procedure: FLEXIBLE SIGMOIDOSCOPY;  Surgeon: [name], MD;  Location: [facility];  Service: Endoscopy;  Laterality: N/A;  . HAND SURGERY    . LUMBAR DISC SURGERY    . PARTIAL HYSTERECTOMY    . TONSILLECTOMY       There were no vitals filed for this visit.  Subjective Assessment - 10/07/18 1210    Subjective   Pt presents for OT treatment visit 18/36 to address BLE lymphedema. Pt is accompanied by paid caregiver. Caregiver reports that Pt relayed to her this morning that compression wraps remained in place after last OT session until last nightt without removing to inspect, bathe and perform skin care. RLE  appears with long, superficial abraisions that are bleeding and with lymphorrhea. RLE skin tears are observed  primarily on feet. Pt denies scratching her legs during the daytime or during HOS.    Patient is accompained by:  Family member    Pertinent History  Chronic bronchitis, CVI, anxiety , systoloc hert failure, asthma, arthritis, multiple spinal surgeries w/ chronic back pain    Limitations  Difficulty walking, decreased standing tolerance, impaired transfer ability, decreased ankle AROM, Pain in BLE, Inability to fit street shoes w/ increased falls risk, increased infection risk.    Patient Stated Goals  reduce swelling and legs feel better    Currently in Pain?  Yes    Pain Onset  More than a month ago                   OT Treatments/Exercises (OP) - 10/07/18 0001  ADLs   ADL Education Given  Yes      Manual Therapy   Manual Therapy  Edema management             OT Education - 10/07/18 1215    Education provided  Yes    Education Details  Pt and family education re-emphasised regarding importance of compression wrap changes q 1-2 days to limit skin breakdown and infection risk. Pt reported to caregiver this morning before session that wraps were not removed for several days after last OT session.     Person(s) Educated  Patient;Caregiver(s);Other (comment)    Methods  Explanation;Demonstration;Handout    Comprehension  Verbalized understanding;Verbal cues required;Returned demonstration          OT Long Term Goals - 09/03/18 1542      OT LONG TERM  GOAL #1   Title  Lymphedema (LE) self-care: Pt able to don/doff adjustable, alternative compression garments ( aka Circ[employer]if, Francia Greaves) between OT visits with Maximal caregiver assistance within 2 weeks to  facilitate improved prognosis and optimal volume reduction during Intensive CDT.    Baseline  dependent    Time  2    Period  Weeks    Status  Achieved      OT LONG TERM GOAL #2   Title  Pt to achieve at least 10% BLE limb volume reductions during Intensive CDT to limit LE progression, decrease infection risk, to reduce foot and leg pain, and to improve ability to fit lower body clothing and shoes.    Baseline  dependent    Time  12    Period  Weeks    Status  Partially Met      OT LONG TERM GOAL #3   Title  Pt >/= 85 % compliant with all daily, LE self-care protocols for home program, including simple self-manual lymphatic drainage (MLD), skin care, lymphatic pumping the ex, skin care, and donning/ doffing compression wraps and garments with needed level of caregiver assistance to limit LE progression and further functional decline.      Baseline  dependent    Time  12    Period  Weeks    Status  Partially Met      OT LONG TERM GOAL #4   Title  Lymphedema (LE) self-care : Pt >/= 85 % compliant with all daily LE self-care protocols, including simple self-manual lymphatic drainage (MLD), skin care, lymphatic pumping therex, and donning/ doffing progression garments with needed level of caregiver assistance to limit LE progression, infection risk and further functional decline.      Baseline  dependent    Time  12    Period  Weeks    Status  Partially Met      OT LONG TERM GOAL #5   Title  Pt to tolerate daily compression wraps, garments and devices in keeping w/ prescribed wear regime within 1 week of issue date to progress and retain clinical and functional gains and to limit LE progression.    Baseline  dependent    Time  12    Period  Weeks    Status  Partially Met             Plan - 10/07/18 1217    Clinical Impression Statement  Pt presents today with marked increase in swelling and bilateral distal legs and feet with long, narrow superfiscial abraisions, or skin tears , and mild lymphorrhea L>R. Once again over visit interval ( last seen 12/5 = 4 days)  wraps were not removed and re-applied, and skin care was not provided by family caregiver at home. OT session for LE  are is abbreviated today consisting only of Pt and caregiver edu for L#E self care, and Pt was directed to wound clinic  close to home. We are unable to reapply wraps with open skin areas as drainage on our wraps results in additional skin tears. Caregiver called  subsequent to session reporting Pt has appt w/ wound clinic tomorrow. Plan going forward: Despite good potential to achieve OT goals for LE  care and self-care, Pt has inconsistent caregiver assistance between visits at home to sustain clinical gains continue w/ progress. Compression wraps are left in place beyond recommended time and pt presents with skin breakdowbn and increased infection risk. OT will discuss plan to DC OT unless substantial progress is made  towards compliance with LE self care within the 2 weeks leading to the holday break., we'll DC OT resulting from reaching clinical plateau and stalled progress towards goals. Without appropriate caregiver assistance prognossise for improvement in condition and further functional decline resulting from LE progression is expected.    Occupational Profile and client history currently impacting functional performance  dimensia, inconsistent CG assistance with removing wraps, providing skin care and reapplying compression between clinical visits. S    Occupational performance deficits (Please refer to evaluation for details):  ADL's;IADL's;Rest and Sleep;Play;Leisure;Social Participation;Other   body image   Rehab Potential  Fair    Current Impairments/barriers affecting progress:  Pt  presents with decreased prognosis 2/2 overall health status w/ multiple comorbidities and history of non-compliance with previous  lymphedema treatment.    OT Frequency  3x / week    OT Duration  12 weeks   and PRN   OT Treatment/Interventions  Self-care/ADL training;DME and/or AE instruction;Manual lymph drainage;Patient/family education;Compression bandaging;Therapeutic exercise;Therapeutic activities;Manual Therapy;Therapist, nutritional;Other (comment)    Plan  skin care to decrease infection risk and improve hydration    Clinical Decision Making  Multiple treatment options, significant modification of task necessary    Recommended Other Services  fit with adjustable , alternative compression wraps for optimal independence w/ donning and doffing    Consulted and Agree with Plan of Care  Patient;Family member/caregiver       Patient will benefit from skilled therapeutic intervention in order to improve the following deficits and impairments:  Decreased range of motion, Difficulty walking, Impaired flexibility, Increased edema, Decreased activity tolerance, Decreased skin integrity, Decreased balance, Decreased knowledge of use of DME, Decreased scar mobility, Pain, Decreased mobility, Decreased knowledge of precautions, Abnormal gait, Decreased cognition, Decreased endurance, Decreased safety awareness, Decreased strength, Impaired vision/preception, Impaired perceived functional ability  Visit Diagnosis: Lymphedema, not elsewhere classified    Problem List Patient Active Problem List   Diagnosis Date Noted  . Chronic pain of multiple joints 08/01/2017  . Age-related osteoporosis without current pathological fracture 08/01/2017  . Left knee pain 09/19/2016  . Refractory anemia, unspecified ([place]) 08/10/2016  . Headache 06/22/2015  . Rectal benign neoplasm   . Asthma, chronic 04/04/2015  . history of Tubular adenoma of colon 04/04/2015  . history of Psychogenic vomiting  04/04/2015  . Hypokalemia 04/04/2015  . CKD (chronic kidney disease) stage 3, GFR 30-59 ml/min (HCC) 04/03/2015  . Dehydration 04/03/2015  . Skin ulcer ([place])   . History of colonic polyps 12/08/2014  . Edema 04/14/2014  . Chronic systolic heart failure ([place]) 11/20/2013  . Nausea with vomiting 11/13/2013  . Lymphedema of lower extremity 10/17/2013  .  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 MGUS (monoclonal gammopathy of unknown significance) 05/15/2012  . VITAMIN D DEFICIENCY 01/29/2010  . BURSITIS, LEFT ELBOW 01/29/2010  . DERMATITIS 09/30/2009  . DIABETES MELLITUS, BORDERLINE 01/11/2009  . Paranoid delusion ([place]) 08/02/2008  . Obstructive chronic bronchitis ([place]) 08/02/2008  . Essential hypertension 11/11/2007  . Venous (peripheral) insufficiency 11/11/2007  . RENAL CALCULUS 11/11/2007  . LOW BACK PAIN, CHRONIC 11/11/2007    Andrey Spearman, MS, OTR/L, [facility] 10/07/18 3:20 PM  [place] MAIN [facility] SERVICES [address] [place], [place], [address] Phone: [phone]   Fax:  [phone]  Name: [name] MRN: [id number] Date of Birth: 1932-07-09

## 2018-10-08 ENCOUNTER — Encounter (HOSPITAL_BASED_OUTPATIENT_CLINIC_OR_DEPARTMENT_OTHER): Payer: Medicare Other | Attending: Internal Medicine

## 2018-10-08 ENCOUNTER — Ambulatory Visit: Payer: Medicare Other | Admitting: Occupational Therapy

## 2018-10-08 ENCOUNTER — Encounter: Payer: Self-pay | Admitting: Occupational Therapy

## 2018-10-08 DIAGNOSIS — J449 Chronic obstructive pulmonary disease, unspecified: Secondary | ICD-10-CM | POA: Insufficient documentation

## 2018-10-08 DIAGNOSIS — I10 Essential (primary) hypertension: Secondary | ICD-10-CM | POA: Insufficient documentation

## 2018-10-08 DIAGNOSIS — I890 Lymphedema, not elsewhere classified: Secondary | ICD-10-CM | POA: Insufficient documentation

## 2018-10-08 DIAGNOSIS — L97522 Non-pressure chronic ulcer of other part of left foot with fat layer exposed: Secondary | ICD-10-CM | POA: Diagnosis not present

## 2018-10-08 DIAGNOSIS — S91301A Unspecified open wound, right foot, initial encounter: Secondary | ICD-10-CM | POA: Diagnosis not present

## 2018-10-08 DIAGNOSIS — S81802A Unspecified open wound, left lower leg, initial encounter: Secondary | ICD-10-CM | POA: Diagnosis not present

## 2018-10-08 DIAGNOSIS — L97822 Non-pressure chronic ulcer of other part of left lower leg with fat layer exposed: Secondary | ICD-10-CM | POA: Insufficient documentation

## 2018-10-08 DIAGNOSIS — S81801A Unspecified open wound, right lower leg, initial encounter: Secondary | ICD-10-CM | POA: Diagnosis not present

## 2018-10-08 DIAGNOSIS — S91302A Unspecified open wound, left foot, initial encounter: Secondary | ICD-10-CM | POA: Diagnosis not present

## 2018-10-08 DIAGNOSIS — L97512 Non-pressure chronic ulcer of other part of right foot with fat layer exposed: Secondary | ICD-10-CM | POA: Diagnosis not present

## 2018-10-08 DIAGNOSIS — L97212 Non-pressure chronic ulcer of right calf with fat layer exposed: Secondary | ICD-10-CM | POA: Diagnosis not present

## 2018-10-10 ENCOUNTER — Ambulatory Visit: Payer: Medicare Other

## 2018-10-10 ENCOUNTER — Encounter: Payer: Medicare Other | Admitting: Occupational Therapy

## 2018-10-10 ENCOUNTER — Encounter: Payer: Self-pay | Admitting: Occupational Therapy

## 2018-10-11 DIAGNOSIS — L97512 Non-pressure chronic ulcer of other part of right foot with fat layer exposed: Secondary | ICD-10-CM | POA: Diagnosis not present

## 2018-10-11 DIAGNOSIS — I10 Essential (primary) hypertension: Secondary | ICD-10-CM | POA: Diagnosis not present

## 2018-10-11 DIAGNOSIS — I890 Lymphedema, not elsewhere classified: Secondary | ICD-10-CM | POA: Diagnosis not present

## 2018-10-11 DIAGNOSIS — L97822 Non-pressure chronic ulcer of other part of left lower leg with fat layer exposed: Secondary | ICD-10-CM | POA: Diagnosis not present

## 2018-10-11 DIAGNOSIS — L97522 Non-pressure chronic ulcer of other part of left foot with fat layer exposed: Secondary | ICD-10-CM | POA: Diagnosis not present

## 2018-10-11 DIAGNOSIS — L97212 Non-pressure chronic ulcer of right calf with fat layer exposed: Secondary | ICD-10-CM | POA: Diagnosis not present

## 2018-10-14 ENCOUNTER — Encounter: Payer: Self-pay | Admitting: Occupational Therapy

## 2018-10-14 ENCOUNTER — Ambulatory Visit: Payer: Medicare Other | Admitting: Occupational Therapy

## 2018-10-16 ENCOUNTER — Encounter: Payer: Medicare Other | Admitting: Occupational Therapy

## 2018-10-16 ENCOUNTER — Telehealth: Payer: Self-pay | Admitting: Nurse Practitioner

## 2018-10-16 ENCOUNTER — Ambulatory Visit: Payer: Medicare Other

## 2018-10-16 DIAGNOSIS — M810 Age-related osteoporosis without current pathological fracture: Secondary | ICD-10-CM

## 2018-10-16 MED ORDER — DENOSUMAB 60 MG/ML ~~LOC~~ SOSY
60.0000 mg | PREFILLED_SYRINGE | Freq: Once | SUBCUTANEOUS | Status: AC
  Administered 2018-10-16: 60 mg via SUBCUTANEOUS

## 2018-10-16 NOTE — Telephone Encounter (Signed)
I spoke with the patient's nephew, and he requested that I mail him the information for [employer]. VDM (DD)

## 2018-10-18 DIAGNOSIS — L97512 Non-pressure chronic ulcer of other part of right foot with fat layer exposed: Secondary | ICD-10-CM | POA: Diagnosis not present

## 2018-10-18 DIAGNOSIS — I890 Lymphedema, not elsewhere classified: Secondary | ICD-10-CM | POA: Diagnosis not present

## 2018-10-18 DIAGNOSIS — L97822 Non-pressure chronic ulcer of other part of left lower leg with fat layer exposed: Secondary | ICD-10-CM | POA: Diagnosis not present

## 2018-10-18 DIAGNOSIS — L97522 Non-pressure chronic ulcer of other part of left foot with fat layer exposed: Secondary | ICD-10-CM | POA: Diagnosis not present

## 2018-10-18 DIAGNOSIS — L97212 Non-pressure chronic ulcer of right calf with fat layer exposed: Secondary | ICD-10-CM | POA: Diagnosis not present

## 2018-10-18 DIAGNOSIS — I10 Essential (primary) hypertension: Secondary | ICD-10-CM | POA: Diagnosis not present

## 2018-10-25 ENCOUNTER — Telehealth: Payer: Self-pay | Admitting: Nurse Practitioner

## 2018-10-25 DIAGNOSIS — I890 Lymphedema, not elsewhere classified: Secondary | ICD-10-CM | POA: Diagnosis not present

## 2018-10-25 DIAGNOSIS — L97212 Non-pressure chronic ulcer of right calf with fat layer exposed: Secondary | ICD-10-CM | POA: Diagnosis not present

## 2018-10-25 DIAGNOSIS — S91301A Unspecified open wound, right foot, initial encounter: Secondary | ICD-10-CM | POA: Diagnosis not present

## 2018-10-25 DIAGNOSIS — S91302A Unspecified open wound, left foot, initial encounter: Secondary | ICD-10-CM | POA: Diagnosis not present

## 2018-10-25 DIAGNOSIS — L97512 Non-pressure chronic ulcer of other part of right foot with fat layer exposed: Secondary | ICD-10-CM | POA: Diagnosis not present

## 2018-10-25 DIAGNOSIS — I10 Essential (primary) hypertension: Secondary | ICD-10-CM | POA: Diagnosis not present

## 2018-10-25 DIAGNOSIS — L97522 Non-pressure chronic ulcer of other part of left foot with fat layer exposed: Secondary | ICD-10-CM | POA: Diagnosis not present

## 2018-10-25 DIAGNOSIS — S81801A Unspecified open wound, right lower leg, initial encounter: Secondary | ICD-10-CM | POA: Diagnosis not present

## 2018-10-25 DIAGNOSIS — S81802A Unspecified open wound, left lower leg, initial encounter: Secondary | ICD-10-CM | POA: Diagnosis not present

## 2018-10-25 DIAGNOSIS — L97822 Non-pressure chronic ulcer of other part of left lower leg with fat layer exposed: Secondary | ICD-10-CM | POA: Diagnosis not present

## 2018-10-25 NOTE — Telephone Encounter (Signed)
I left a message asking the patient's nephew, [name], to let me know if he received the [employer] application that was mailed to him. VDM (DD)

## 2018-11-04 ENCOUNTER — Encounter: Payer: Self-pay | Admitting: Nurse Practitioner

## 2018-11-04 ENCOUNTER — Ambulatory Visit: Payer: Medicare Other | Admitting: Occupational Therapy

## 2018-11-04 ENCOUNTER — Ambulatory Visit (INDEPENDENT_AMBULATORY_CARE_PROVIDER_SITE_OTHER): Payer: Medicare Other | Admitting: Nurse Practitioner

## 2018-11-04 VITALS — BP 122/80 | HR 67 | Temp 98.5°F | Ht <= 58 in | Wt 143.8 lb

## 2018-11-04 DIAGNOSIS — F22 Delusional disorders: Secondary | ICD-10-CM | POA: Diagnosis not present

## 2018-11-04 DIAGNOSIS — J4489 Other specified chronic obstructive pulmonary disease: Secondary | ICD-10-CM

## 2018-11-04 DIAGNOSIS — R1013 Epigastric pain: Secondary | ICD-10-CM | POA: Diagnosis not present

## 2018-11-04 DIAGNOSIS — I5022 Chronic systolic (congestive) heart failure: Secondary | ICD-10-CM

## 2018-11-04 DIAGNOSIS — I10 Essential (primary) hypertension: Secondary | ICD-10-CM | POA: Diagnosis not present

## 2018-11-04 DIAGNOSIS — M810 Age-related osteoporosis without current pathological fracture: Secondary | ICD-10-CM | POA: Diagnosis not present

## 2018-11-04 DIAGNOSIS — D509 Iron deficiency anemia, unspecified: Secondary | ICD-10-CM

## 2018-11-04 DIAGNOSIS — J449 Chronic obstructive pulmonary disease, unspecified: Secondary | ICD-10-CM | POA: Diagnosis not present

## 2018-11-04 DIAGNOSIS — E559 Vitamin D deficiency, unspecified: Secondary | ICD-10-CM

## 2018-11-04 DIAGNOSIS — I890 Lymphedema, not elsewhere classified: Secondary | ICD-10-CM

## 2018-11-04 DIAGNOSIS — G629 Polyneuropathy, unspecified: Secondary | ICD-10-CM

## 2018-11-04 DIAGNOSIS — M2550 Pain in unspecified joint: Secondary | ICD-10-CM

## 2018-11-04 DIAGNOSIS — G8929 Other chronic pain: Secondary | ICD-10-CM

## 2018-11-04 MED ORDER — GABAPENTIN 100 MG PO CAPS: ORAL_CAPSULE | ORAL | 0 refills | Status: DC

## 2018-11-04 MED ORDER — RANITIDINE HCL 150 MG PO TABS: 150.0000 mg | ORAL_TABLET | Freq: Every day | ORAL | 2 refills | Status: DC

## 2018-11-04 MED ORDER — CLOTRIMAZOLE-BETAMETHASONE 1-0.05 % EX CREA: 1.0000 "application " | TOPICAL_CREAM | Freq: Two times a day (BID) | CUTANEOUS | 3 refills | Status: DC

## 2018-11-04 MED ORDER — CARVEDILOL 6.25 MG PO TABS: ORAL_TABLET | ORAL | 1 refills | Status: DC

## 2018-11-04 MED ORDER — FLUTICASONE-SALMETEROL 115-21 MCG/ACT IN AERO: INHALATION_SPRAY | RESPIRATORY_TRACT | 3 refills | Status: DC

## 2018-11-04 MED ORDER — THEOPHYLLINE ER 200 MG PO CP24: 200.0000 mg | ORAL_CAPSULE | ORAL | 2 refills | Status: DC

## 2018-11-04 MED ORDER — TRAMADOL HCL 50 MG PO TABS: 50.0000 mg | ORAL_TABLET | Freq: Three times a day (TID) | ORAL | 0 refills | Status: DC | PRN

## 2018-11-04 MED ORDER — OLANZAPINE 5 MG PO TABS: 5.0000 mg | ORAL_TABLET | Freq: Every day | ORAL | 1 refills | Status: DC

## 2018-11-04 MED ORDER — FERROUS SULFATE DRIED 200 (65 FE) MG PO TABS: 1.0000 | ORAL_TABLET | Freq: Two times a day (BID) | ORAL | 2 refills | Status: DC

## 2018-11-04 MED ORDER — HYDRALAZINE HCL 25 MG PO TABS: 25.0000 mg | ORAL_TABLET | Freq: Two times a day (BID) | ORAL | 0 refills | Status: DC

## 2018-11-04 NOTE — Progress Notes (Signed)
Careteam: Patient Care Team: Lauree Chandler, NP as PCP - General (Geriatric Medicine) [name], MD (Pulmonary Disease) Jacolyn Reedy, MD as Consulting Physician (Cardiology)  Advanced Directive information Does Patient Have a Medical Advance Directive?: No  Allergies  Allergen Reactions  . Aspirin Other (See Comments)    REACTION: upset stomach  . Codeine Other (See Comments)    REACTION: "MAKES MY BODY GO CRAZY; CRAMPS"  . Latex Nausea And Vomiting    Chief Complaint  Patient presents with  . Medical Management of Chronic Issues    3 month follow up, medcation refills, and schedule prolia injection     HPI: Patient is a 85 y.o. female seen in the office today for routine follow up.  Here alone today, nephew in waiting room. Lives with step son and niece.   She is followed by OT for LE lymphedema. Drainage form legs have stopped. She is a poor historian. Hx obtained from chart.   Chronic LBP/generalized joint pain  - controlled on tramadol (needs refill) and applies topical agents. She has noticed increased swelling in L>R wrist. 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   Osteoporosis- DXA 06/2017 revealed osteoporosis with T score -2.6. She is taking Ca w D 1200mg  daily and Prolia injections   Chronic systolic HF - stable on coreg, torsemide + potassium and hydralazine.  Followed by cardio Dr Daneen Schick. EF 55-60% (EF 35-40% by 2D echo in 2015). Prior echo also revealed septal wall akinesis/severe hypokinesis; mod-sev diffuse hypokinesis. She reports she needs to make appt with Dr [name]   Peripheral neuropathy - overall controlled on gabapentin. She has tingling in hands. Followed by neurology Dr Tomi Likens. Out of medication and pain is worse   Venous insufficiency/chronic LE lymphedema - improved lymphedema; she is followed by outpatient rehab OT; stable on torsemide, clotrimazole/betamethasone cream, silvadene cream. She does not use her  TED stockings consistently.    Psychosis/ hallucinations - per records has been stable and unchanged. no new behavioral disturbances and no formal psych evaluation. Family not willing for counseling at this time. She lives with family and gets meals on wheels.  she is taking zyprexa 5mg  qhs. States she does not use alprazolam, there has not been refill provided on this in a while.  COPD- using advair twice daily, unsure how much she is using albuterol due to being a poor historian.     Review of Systems:  Review of Systems  Unable to perform ROS: Psychiatric disorder  and dementia  Past Medical History:  Diagnosis Date  . Anemia, unspecified   . Anxiety   . Arthritis   . Asthma   . Delusions ([place])   . Depression   . Edema 2005   "since put the glass in them"   . GERD (gastroesophageal reflux disease)   . Lumbago   . Lymphedema   . Obstructive chronic bronchitis without exacerbation ([place])   . Other abnormal glucose   . 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[place]) 04/04/2013  . Systolic heart failure (HCC)    EF 35 to 40% per echo January 2015  . Unspecified essential hypertension   . Unspecified nonpsychotic mental disorder   . Unspecified venous (peripheral) insufficiency   . Unspecified venous (peripheral) insufficiency   . Unspecified vitamin D deficiency    Past Surgical History:  Procedure Laterality Date  . APPENDECTOMY    . CERVICAL SPINE SURGERY     x 2  . CHOLECYSTECTOMY    .  ESOPHAGOGASTRODUODENOSCOPY N/A 04/05/2015   Procedure: ESOPHAGOGASTRODUODENOSCOPY (EGD);  Surgeon: [name], MD;  Location: [facility];  Service: Endoscopy;  Laterality: N/A;  . FLEXIBLE SIGMOIDOSCOPY N/A 04/05/2015   Procedure: FLEXIBLE SIGMOIDOSCOPY;  Surgeon: [name], MD;  Location: [facility];  Service: Endoscopy;  Laterality: N/A;  . HAND SURGERY    . LUMBAR DISC SURGERY    . PARTIAL HYSTERECTOMY    . TONSILLECTOMY     Social History:   reports that she has never smoked. She has never  used smokeless tobacco. She reports that she does not drink alcohol or use drugs.  Family History  Problem Relation Age of Onset  . Diabetes Mother   . Arthritis Father   . Diabetes Paternal Grandfather        entire family on both sides  . Heart disease Maternal Aunt        entire family of both sides  . Cancer Paternal Aunt        type unknown  . Diabetes Sister   . Breast cancer Cousin     Medications: Patient's Medications  New Prescriptions   No medications on file  Previous Medications   ACETAMINOPHEN (TYLENOL 8 HOUR) 650 MG CR TABLET    Take 1 tablet (650 mg total) by mouth 3 (three) times daily with meals.   ALBUTEROL (PROAIR HFA) 108 (90 BASE) MCG/ACT INHALER    INHALE 2 PUFFS EVERY 4 HOURS AS NEEDED FOR WHEEZING   ALPRAZOLAM (XANAX) 0.5 MG TABLET    Take 1/2 to 1 tablet by mouth three times daily as needed for nerves   ASCORBIC ACID (VITAMIN C) 1000 MG TABLET    2 by mouth twice daily before lunch and supper    CALCIUM-VITAMIN D (OSCAL WITH D) 500-200 MG-UNIT TABLET    Take 1 tablet by mouth daily with breakfast.   CARVEDILOL (COREG) 6.25 MG TABLET    Take 1 tablet twice daily in lunch and supper.   CLOTRIMAZOLE-BETAMETHASONE (LOTRISONE) CREAM    Apply 1 application topically 2 (two) times daily. To your arm and leg rash   DENOSUMAB (PROLIA) 60 MG/ML SOSY INJECTION    Inject 60 mg into the skin every 6 (six) months.   FERROUS SULFATE DRIED 200 (65 FE) MG TABS    Take 1 tablet by mouth 2 (two) times daily before lunch and supper.   FLUTICASONE-SALMETEROL (ADVAIR HFA) 115-21 MCG/ACT INHALER    USE 2 INHALATIONS TWICE A DAY   GABAPENTIN (NEURONTIN) 100 MG CAPSULE    TAKE 2 CAPSULES IN THE MORNING AND 2 CAPSULES AT BEDTIME   HYDRALAZINE (APRESOLINE) 25 MG TABLET    Take 1 tablet (25 mg total) by mouth 2 (two) times daily at 8 am and 10 pm.   OLANZAPINE (ZYPREXA) 5 MG TABLET    Take 1 tablet (5 mg total) by mouth at bedtime.   POTASSIUM CHLORIDE SA (K-DUR,KLOR-CON) 20 MEQ  TABLET    Take 2 tablets (40 mEq total) by mouth 2 (two) times daily before lunch and supper.   RANITIDINE (ZANTAC) 150 MG TABLET    Take 1 tablet (150 mg total) by mouth at bedtime.   THEOPHYLLINE (THEO-24) 200 MG 24 HR CAPSULE    Take 1 capsule (200 mg total) by mouth every morning.   TORSEMIDE (DEMADEX) 20 MG TABLET    Take 3 tablets (60 mg total) by mouth 2 (two) times daily at 8 am and 10 pm.   TRAMADOL (ULTRAM) 50 MG TABLET  Take 1 tablet (50 mg total) by mouth 3 (three) times daily as needed.   VITAMIN D, ERGOCALCIFEROL, (DRISDOL) 50000 UNITS CAPS CAPSULE    Take 50,000 Units every Monday by mouth.   Modified Medications   No medications on file  Discontinued Medications   No medications on file     Physical Exam:  Vitals:   11/04/18 1508  BP: 122/80  Pulse: 67  Temp: 98.5 F (36.9 C)  TempSrc: Oral  SpO2: 97%  Weight: 143 lb 12.8 oz (65.2 kg)  Height: 4\' 10"  (1.473 m)   Body mass index is 30.05 kg/m.  Physical Exam Constitutional:      Appearance: Normal appearance. She is well-developed.  HENT:     Mouth/Throat:     Mouth: Mucous membranes are dry.     Pharynx: No oropharyngeal exudate.  Eyes:     General: No scleral icterus.    Pupils: Pupils are equal, round, and reactive to light.  Neck:     Musculoskeletal: Neck supple.     Thyroid: No thyromegaly.     Vascular: No carotid bruit.     Trachea: No tracheal deviation.  Cardiovascular:     Rate and Rhythm: Normal rate and regular rhythm.     Heart sounds: Murmur (1/6 SEM) present. No friction rub. No gallop.      Comments: L>RLE lymphedema with chronic venous stasis changes; no weeping; no calf TTP b/l Pulmonary:     Effort: Pulmonary effort is normal. No respiratory distress.     Breath sounds: Normal breath sounds. No stridor. No wheezing or rales.  Abdominal:     General: Bowel sounds are normal. There is no distension.     Palpations: Abdomen is soft. Abdomen is not rigid. There is no hepatomegaly  or mass.     Tenderness: There is no abdominal tenderness. There is no guarding or rebound.     Hernia: No hernia is present.  Musculoskeletal:     Right lower leg: Edema present.     Left lower leg: Edema present.     Comments: Legs wrapped bilaterally  Lymphadenopathy:     Cervical: No cervical adenopathy.  Skin:    General: Skin is warm and dry.  Neurological:     Mental Status: She is alert.     Deep Tendon Reflexes: Reflexes are normal and symmetric.  Psychiatric:        Behavior: Behavior normal.        Thought Content: Thought content is delusional.     Labs reviewed: Basic Metabolic Panel: Recent Labs    02/12/18 1511 06/14/18 1210  NA 137 139  K 4.2 3.6  CL 103 99  CO2 25 30  GLUCOSE 90 118  BUN 17 26*  CREATININE 1.70* 1.46*  CALCIUM 9.7 10.1   Liver Function Tests: Recent Labs    02/12/18 1511  AST 19  ALT 8  BILITOT 0.4  PROT 8.8*   No results for input(s): LIPASE, AMYLASE in the last 8760 hours. No results for input(s): AMMONIA in the last 8760 hours. CBC: Recent Labs    02/12/18 1511 06/14/18 1210  WBC 7.6 6.9  NEUTROABS 5,388 5,382  HGB 9.3* 10.4*  HCT 28.8* 33.3*  MCV 82.3 86.3  PLT 377 247   Lipid Panel: No results for input(s): CHOL, HDL, LDLCALC, TRIG, CHOLHDL, LDLDIRECT in the last 8760 hours. TSH: No results for input(s): TSH in the last 8760 hours. A1C: Lab Results  Component Value Date  HGBA1C 5.5 04/14/2014     Assessment/Plan 1. Paranoid delusion ([place]) Stable  - OLANZapine (ZYPREXA) 5 MG tablet; Take 1 tablet (5 mg total) by mouth at bedtime.  Dispense: 90 tablet; Refill: 1  2. Chronic systolic heart failure (HCC) Stable on coreg and demadex, she is with poor PO intake, will follow up labs, also needs to follow up with cardiologist.  - carvedilol (COREG) 6.25 MG tablet; Take 1 tablet twice daily in lunch and supper.  Dispense: 180 tablet; Refill: 1 - CBC with Differential/Platelets - COMPLETE METABOLIC PANEL WITH  GFR  3. Lymphedema of both lower extremities -continues to follow up with OT who wraps legs. - clotrimazole-betamethasone (LOTRISONE) cream; Apply 1 application topically 2 (two) times daily. To your arm and leg rash  Dispense: 60 g; Refill: 3  4. Dyspepsia -controlled on zantac  - ranitidine (ZANTAC) 150 MG tablet; Take 1 tablet (150 mg total) by mouth at bedtime.  Dispense: 30 tablet; Refill: 2  5. Chronic pain of multiple joints -stable on ultram - traMADol (ULTRAM) 50 MG tablet; Take 1 tablet (50 mg total) by mouth 3 (three) times daily as needed.  Dispense: 90 tablet; Refill: 0  6. Age-related osteoporosis without current pathological fracture -continues on prolia, cal and vit d recommended.   7. Obstructive chronic bronchitis ([place]) Controlled on current regimen, reports she does not use albuterol routinely - fluticasone-salmeterol (ADVAIR HFA) 115-21 MCG/ACT inhaler; USE 2 INHALATIONS TWICE A DAY  Dispense: 36 g; Refill: 3 - theophylline (THEO-24) 200 MG 24 hr capsule; Take 1 capsule (200 mg total) by mouth every morning.  Dispense: 30 capsule; Refill: 2  8. Vitamin D deficiency -does not appear to be taking supplement.  - Vitamin D, 25-hydroxy  9. Essential hypertension Controlled on current regimen.  - hydrALAZINE (APRESOLINE) 25 MG tablet; Take 1 tablet (25 mg total) by mouth 2 (two) times daily at 8 am and 10 pm.  Dispense: 180 tablet; Refill: 0  10. Iron deficiency anemia, unspecified iron deficiency anemia type - Ferrous Sulfate Dried 200 (65 Fe) MG TABS; Take 1 tablet by mouth 2 (two) times daily before lunch and supper.  Dispense: 60 tablet; Refill: 2 - CBC with Differential/Platelets - Iron, TIBC and Ferritin Panel  11. Peripheral polyneuropathy Controlled on medication.  - gabapentin (NEURONTIN) 100 MG capsule; TAKE 2 CAPSULES IN THE MORNING AND 2 CAPSULES AT BEDTIME  Dispense: 360 capsule; Refill: 0    Next appt: 4 months, sooner if needed  Greysyn Vanderberg K.  [place], [facility] [phone]

## 2018-11-04 NOTE — Patient Instructions (Signed)
PLEASE MAKE FOLLOW UP WITH DR Bardolph III, MD Address: [address], [place], [place] [address] Phone: [phone]  Some of your prescriptions have not been sent to pharmacy others will wait on labs   Follow up in 4 months for routine follow up

## 2018-11-05 LAB — CBC WITH DIFFERENTIAL/PLATELET
Absolute Monocytes: 520 cells/uL (ref 200–950)
BASOS ABS: 31 {cells}/uL (ref 0–200)
Basophils Relative: 0.6 %
EOS PCT: 0.8 %
Eosinophils Absolute: 42 cells/uL (ref 15–500)
HCT: 34.2 % — ABNORMAL LOW (ref 35.0–45.0)
Hemoglobin: 10.9 g/dL — ABNORMAL LOW (ref 11.7–15.5)
Lymphs Abs: 1570 cells/uL (ref 850–3900)
MCH: 26.7 pg — ABNORMAL LOW (ref 27.0–33.0)
MCHC: 31.9 g/dL — AB (ref 32.0–36.0)
MCV: 83.8 fL (ref 80.0–100.0)
MPV: 10.4 fL (ref 7.5–12.5)
Monocytes Relative: 10 %
Neutro Abs: 3037 cells/uL (ref 1500–7800)
Neutrophils Relative %: 58.4 %
Platelets: 252 10*3/uL (ref 140–400)
RBC: 4.08 10*6/uL (ref 3.80–5.10)
RDW: 14.1 % (ref 11.0–15.0)
Total Lymphocyte: 30.2 %
WBC: 5.2 10*3/uL (ref 3.8–10.8)

## 2018-11-05 LAB — COMPLETE METABOLIC PANEL WITH GFR
AG RATIO: 0.9 (calc) — AB (ref 1.0–2.5)
ALT: 9 U/L (ref 6–29)
AST: 17 U/L (ref 10–35)
Albumin: 4 g/dL (ref 3.6–5.1)
Alkaline phosphatase (APISO): 70 U/L (ref 33–130)
BUN/Creatinine Ratio: 35 (calc) — ABNORMAL HIGH (ref 6–22)
BUN: 55 mg/dL — ABNORMAL HIGH (ref 7–25)
CO2: 30 mmol/L (ref 20–32)
Calcium: 9.2 mg/dL (ref 8.6–10.4)
Chloride: 101 mmol/L (ref 98–110)
Creat: 1.58 mg/dL — ABNORMAL HIGH (ref 0.60–0.88)
GFR, Est African American: 34 mL/min/{1.73_m2} — ABNORMAL LOW (ref >=60)
GFR, Est Non African American: 30 mL/min/{1.73_m2} — ABNORMAL LOW (ref >=60)
Globulin: 4.3 g/dL (calc) — ABNORMAL HIGH (ref 1.9–3.7)
Glucose, Bld: 77 mg/dL (ref 65–139)
Potassium: 3.8 mmol/L (ref 3.5–5.3)
Sodium: 139 mmol/L (ref 135–146)
Total Bilirubin: 0.4 mg/dL (ref 0.2–1.2)
Total Protein: 8.3 g/dL — ABNORMAL HIGH (ref 6.1–8.1)

## 2018-11-05 LAB — IRON,TIBC AND FERRITIN PANEL
%SAT: 19 % (calc) (ref 16–45)
Ferritin: 327 ng/mL — ABNORMAL HIGH (ref 16–288)
Iron: 50 ug/dL (ref 45–160)
TIBC: 266 mcg/dL (calc) (ref 250–450)

## 2018-11-05 LAB — VITAMIN D 25 HYDROXY (VIT D DEFICIENCY, FRACTURES): Vit D, 25-Hydroxy: 25 ng/mL — ABNORMAL LOW (ref 30–100)

## 2018-11-06 ENCOUNTER — Other Ambulatory Visit: Payer: Self-pay

## 2018-11-06 ENCOUNTER — Ambulatory Visit: Payer: Medicare Other | Admitting: Occupational Therapy

## 2018-11-06 DIAGNOSIS — N183 Chronic kidney disease, stage 3 unspecified: Secondary | ICD-10-CM

## 2018-11-06 MED ORDER — TORSEMIDE 20 MG PO TABS: 40.0000 mg | ORAL_TABLET | Freq: Two times a day (BID) | ORAL | 1 refills | Status: DC

## 2018-11-06 NOTE — Telephone Encounter (Signed)
[facility] to update medication dosage instructions for patient at the request of her nephew. Pharmacy stated that patient has not had any refills done since August 2019.   Called and spoke with nephew [name], he stated to call [facility] and give new medication changes to them. Called and spoke with [facility]. [facility] stated that they have have never filled torsemide 20 mg tablet and do not have it on file.   Called nephew [name] again to verify which pharmacy they would like to use for patient's medication refills. Nephew stated that he would like to use [facility] it is closer to her house and easy for pick up. He stated he would no longer be using [facility] for refills. Updated patients medication and pharmacy and sent prescription refill to pharmacy.

## 2018-11-11 ENCOUNTER — Ambulatory Visit: Payer: Medicare Other | Attending: Internal Medicine | Admitting: Occupational Therapy

## 2018-11-13 ENCOUNTER — Ambulatory Visit: Payer: Medicare Other | Admitting: Occupational Therapy

## 2018-11-18 ENCOUNTER — Ambulatory Visit: Payer: Medicare Other | Admitting: Occupational Therapy

## 2018-11-20 ENCOUNTER — Encounter: Payer: Medicare Other | Admitting: Occupational Therapy

## 2018-11-21 ENCOUNTER — Other Ambulatory Visit: Payer: Medicare Other

## 2018-11-25 ENCOUNTER — Telehealth: Payer: Self-pay

## 2018-11-25 ENCOUNTER — Encounter: Payer: Medicare Other | Admitting: Occupational Therapy

## 2018-11-25 NOTE — Telephone Encounter (Signed)
-----   Message from Lauree Chandler, NP sent at 11/25/2018  9:53 AM EST ----- She missed her lab appt after changes in medication, can we please call and have her come in for this.  ----- Message ----- From: SYSTEM Sent: 11/25/2018  12:08 AM EST To: Lauree Chandler, NP

## 2018-11-25 NOTE — Telephone Encounter (Signed)
Left message on voicemail for [name] stressing the need to keep the pending appointment for labs on 11/28/2018.

## 2018-11-28 ENCOUNTER — Telehealth: Payer: Self-pay

## 2018-11-28 ENCOUNTER — Other Ambulatory Visit: Payer: Medicare Other

## 2018-11-28 DIAGNOSIS — I890 Lymphedema, not elsewhere classified: Secondary | ICD-10-CM

## 2018-11-28 DIAGNOSIS — N183 Chronic kidney disease, stage 3 (moderate): Secondary | ICD-10-CM | POA: Diagnosis not present

## 2018-11-28 LAB — BASIC METABOLIC PANEL WITH GFR
BUN/Creatinine Ratio: 19 (calc) (ref 6–22)
BUN: 25 mg/dL (ref 7–25)
CO2: 25 mmol/L (ref 20–32)
Calcium: 9.6 mg/dL (ref 8.6–10.4)
Chloride: 106 mmol/L (ref 98–110)
Creat: 1.3 mg/dL — ABNORMAL HIGH (ref 0.60–0.88)
GFR, Est African American: 44 mL/min/{1.73_m2} — ABNORMAL LOW (ref >=60)
GFR, Est Non African American: 38 mL/min/{1.73_m2} — ABNORMAL LOW (ref >=60)
Glucose, Bld: 81 mg/dL (ref 65–139)
Potassium: 3.9 mmol/L (ref 3.5–5.3)
Sodium: 140 mmol/L (ref 135–146)

## 2018-11-28 NOTE — Telephone Encounter (Signed)
Do they need another referral?

## 2018-11-28 NOTE — Telephone Encounter (Signed)
Patient's nephew [name] is requesting patient is in need of Lymphedema treatments be restarted. Patient is currently been going to  [facility] [place], [place], [address] Phone: [phone]   Fax:  [phone]  Alanson Puls states she was unable to do last appointment due to illness. Please advise.

## 2018-11-29 NOTE — Telephone Encounter (Signed)
Yes

## 2018-12-05 NOTE — Progress Notes (Signed)
Cardiology Office Note:    Date:  12/06/2018   ID:  [name], DOB Jul 01, 1932, MRN [id number]  PCP:  Lauree Chandler, NP  Cardiologist:  No primary care provider on file.   Referring MD: Lauree Chandler, NP   Chief Complaint  Patient presents with  . Congestive Heart Failure  . Hypertension    History of Present Illness:    [name] is a 85 y.o. female with a hx of  venous insufficiency, systolic CHF, HTN, COPD, CKD, MGUS, anemia. Echo in 1/15 with EF 35-40%. FU nuclear study in 3/15 with EF 50%.  [name] has no complaints.  She is here because we request that that she return.  She is compliant with her current medical regimen.  She denies orthopnea, PND, lower extremity swelling, palpitations, syncope, and claudication.  She has fleeting chest pain.  It is left parasternal.  He does a "shooting" discomfort.  It lasts for seconds and goes away.  It is not associated with physical activity.  Past Medical History:  Diagnosis Date  . Anemia, unspecified   . Anxiety   . Arthritis   . Asthma   . Delusions ([place])   . Depression   . Edema 2005   "since put the glass in them"   . GERD (gastroesophageal reflux disease)   . Lumbago   . Lymphedema   . Obstructive chronic bronchitis without exacerbation ([place])   . Other abnormal glucose   . 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 Systolic heart failure ([facility])    EF 35 to 40% per echo January 2015  . Unspecified essential hypertension   . Unspecified nonpsychotic mental disorder   . Unspecified venous (peripheral) insufficiency   . Unspecified venous (peripheral) insufficiency   . Unspecified vitamin D deficiency     Past Surgical History:  Procedure Laterality Date  . APPENDECTOMY    . CERVICAL SPINE SURGERY     x 2  . CHOLECYSTECTOMY    . ESOPHAGOGASTRODUODENOSCOPY N/A 04/05/2015   Procedure: ESOPHAGOGASTRODUODENOSCOPY (EGD);  Surgeon: [name], MD;  Location: [facility];  Service: Endoscopy;   Laterality: N/A;  . FLEXIBLE SIGMOIDOSCOPY N/A 04/05/2015   Procedure: FLEXIBLE SIGMOIDOSCOPY;  Surgeon: [name], MD;  Location: [facility];  Service: Endoscopy;  Laterality: N/A;  . HAND SURGERY    . LUMBAR DISC SURGERY    . PARTIAL HYSTERECTOMY    . TONSILLECTOMY      Current Medications: Current Meds  Medication Sig  . acetaminophen (TYLENOL 8 HOUR) 650 MG CR tablet Take 1 tablet (650 mg total) by mouth 3 (three) times daily with meals.  [name] albuterol (PROAIR HFA) 108 (90 Base) MCG/ACT inhaler INHALE 2 PUFFS EVERY 4 HOURS AS NEEDED FOR WHEEZING  . ALPRAZolam (XANAX) 0.5 MG tablet Take 1/2 to 1 tablet by mouth three times daily as needed for nerves  . Ascorbic Acid (VITAMIN C) 1000 MG tablet 2 by mouth twice daily before lunch and supper   . calcium-vitamin D (OSCAL WITH D) 500-200 MG-UNIT tablet Take 1 tablet by mouth daily with breakfast.  . carvedilol (COREG) 6.25 MG tablet Take 1 tablet twice daily in lunch and supper.  . clotrimazole-betamethasone (LOTRISONE) cream Apply 1 application topically 2 (two) times daily. To your arm and leg rash  . denosumab (PROLIA) 60 MG/ML SOSY injection Inject 60 mg into the skin every 6 (six) months.  . Ferrous Sulfate Dried 200 (65 Fe) MG TABS Take 1 tablet by mouth 2 (  two) times daily before lunch and supper.  . fluticasone-salmeterol (ADVAIR HFA) 115-21 MCG/ACT inhaler USE 2 INHALATIONS TWICE A DAY  . gabapentin (NEURONTIN) 100 MG capsule TAKE 2 CAPSULES IN THE MORNING AND 2 CAPSULES AT BEDTIME  . hydrALAZINE (APRESOLINE) 25 MG tablet Take 1 tablet (25 mg total) by mouth 2 (two) times daily at 8 am and 10 pm.  . OLANZapine (ZYPREXA) 5 MG tablet Take 1 tablet (5 mg total) by mouth at bedtime.  . potassium chloride SA (K-DUR,KLOR-CON) 20 MEQ tablet Take 2 tablets (40 mEq total) by mouth 2 (two) times daily before lunch and supper.  . ranitidine (ZANTAC) 150 MG tablet Take 1 tablet (150 mg total) by mouth at bedtime.  . theophylline (THEO-24)  200 MG 24 hr capsule Take 1 capsule (200 mg total) by mouth every morning.  . torsemide (DEMADEX) 20 MG tablet Take 2 tablets (40 mg total) by mouth 2 (two) times daily. Take 2 tabs at 8 am and 10 pm  . traMADol (ULTRAM) 50 MG tablet Take 1 tablet (50 mg total) by mouth 3 (three) times daily as needed.  . Vitamin D, Ergocalciferol, (DRISDOL) 50000 units CAPS capsule Take 50,000 Units every Monday by mouth.      Allergies:   Aspirin; Codeine; and Latex   Social History   Socioeconomic History  . Marital status: Widowed    Spouse name: Not on file  . Number of children: 0  . Years of education: Not on file  . Highest education level: Not on file  Occupational History  . Occupation: retired  Scientific laboratory technician  . Financial resource strain: Not hard at all  . Food insecurity:    Worry: Never true    Inability: Never true  . Transportation needs:    Medical: No    Non-medical: No  Tobacco Use  . Smoking status: Never Smoker  . Smokeless tobacco: Never Used  Substance and Sexual Activity  . Alcohol use: No  . Drug use: No  . Sexual activity: Not Currently  Lifestyle  . Physical activity:    Days per week: 7 days    Minutes per session: 10 min  . Stress: Not at all  Relationships  . Social connections:    Talks on phone: More than three times a week    Gets together: More than three times a week    Attends religious service: 1 to 4 times per year    Active member of club or organization: No    Attends meetings of clubs or organizations: Never    Relationship status: Widowed  Other Topics Concern  . Not on file  Social History Narrative   Diet? Good, low appetite      Do you drink/eat things with caffeine? no      Marital status?                  widowed                  What year were you married?      Do you live in a house, apartment, assisted living, condo, trailer, etc.? house      Is it one or more stories? 1 story      How many persons live in your home? Only me       Do you have any pets in your home? (please list) no      Current or past profession: lab tech  Do you exercise?        some                              Type & how often? Foot rolls, some walking      Do you have a living will? yes      Do you have a DNR form?    no                              If not, do you want to discuss one?      Do you have signed POA/HPOA for forms? yes     Family History: The patient's family history includes Arthritis in her father; Breast cancer in her cousin; Cancer in her paternal aunt; Diabetes in her mother, paternal grandfather, and sister; Heart disease in her maternal aunt.  ROS:   Please see the history of present illness.    Occasional leg pain and back pain.  All other systems reviewed and are negative.  EKGs/Labs/Other Studies Reviewed:    The following studies were reviewed today:  Doppler echocardiogram May 16, 2017: Study Conclusions  - Left ventricle: The cavity size was normal. Systolic function was   normal. The estimated ejection fraction was in the range of 55%   to 60%. Wall motion was normal; there were no regional wall   motion abnormalities. Doppler parameters are consistent with   abnormal left ventricular relaxation (grade 1 diastolic   dysfunction). - Aortic valve: Moderately thickened, moderately calcified   leaflets. - Left atrium: The atrium was mildly dilated.  Impressions:  - EF is improved when compared to prior ECHO (35%)    EKG:  EKG sinus rhythm, left axis deviation, poor R wave progression V1 through V4.  When compared to prior EKG from December 2017, no significant change has occurred.  Recent Labs: 11/04/2018: ALT 9; Hemoglobin 10.9; Platelets 252 11/28/2018: BUN 25; Creat 1.30; Potassium 3.9; Sodium 140  Recent Lipid Panel    Component Value Date/Time   CHOL 207 (H) 11/24/2016 0929   TRIG 73 11/24/2016 0929   HDL 81 11/24/2016 0929   CHOLHDL 2.6 11/24/2016 0929   VLDL 15 11/24/2016 0929     LDLCALC 111 (H) 11/24/2016 0929    Physical Exam:    VS:  BP 132/70   Pulse 65   Ht 4\' 10"  (1.473 m)   Wt 149 lb 12.8 oz (67.9 kg)   SpO2 100%   BMI 31.31 kg/m     Wt Readings from Last 3 Encounters:  12/06/18 149 lb 12.8 oz (67.9 kg)  11/04/18 143 lb 12.8 oz (65.2 kg)  08/16/18 146 lb (66.2 kg)     GEN: Elderly appearing younger than stated age. No acute distress HEENT: Normal NECK: No JVD. LYMPHATICS: No lymphadenopathy CARDIAC: RRR.  1/6 systolic left lower sternal border murmur, no gallop, no edema VASCULAR: Right radial and bilateral carotid 2+ pulses.  Left radial is absent.  Left ulnar is positive.  No carotid bruits RESPIRATORY:  Clear to auscultation without rales, wheezing or rhonchi  ABDOMEN: Soft, non-tender, non-distended, No pulsatile mass, MUSCULOSKELETAL: No deformity  SKIN: Warm and dry NEUROLOGIC:  Alert and oriented x 3 PSYCHIATRIC:  Normal affect   ASSESSMENT:    1. Chronic systolic heart failure ([place])   2. Essential hypertension   3. CKD (chronic kidney disease) stage 3, GFR 30-59  ml/min ([place])    PLAN:    In order of problems listed above:  1. She has systolic heart failure that transition to diastolic heart failure with EF of 55%.  She is on beta-blocker therapy and hydralazine.  Diuretic therapy is also being used.  She feels well.  Denies orthopnea and PND.  Plan 2D Doppler echocardiogram prior to next year's visit to follow-up on EF.  Low-salt diet is recommended.  Continue hydralazine and carvedilol. 2. Blood pressure control is adequate and at target in the 132/70 mmHg range.  Continue diuretic, Coreg, and Apresoline. 3. Current labs are stable.  Chronic kidney disease stage III is present.  Guideline directed therapy for left ventricular systolic dysfunction: Angiotensin receptor-neprilysin inhibitor (ARNI)-Entresto; beta-blocker therapy - carvedilol or metoprolol succinate; mineralocorticoid receptor antagonist (MRA) therapy  -spironolactone or eplerenone.  These therapies have been shown to improve clinical outcomes including reduction of rehospitalization survival, and acute heart failure.  Plan repeat echocardiogram prior to next office visit.  Additional therapy will be dependent upon findings.   Medication Adjustments/Labs and Tests Ordered: Current medicines are reviewed at length with the patient today.  Concerns regarding medicines are outlined above.  Orders Placed This Encounter  Procedures  . EKG 12-Lead   No orders of the defined types were placed in this encounter.   Patient Instructions  Medication Instructions:  Your physician recommends that you continue on your current medications as directed. Please refer to the Current Medication list given to you today.  If you need a refill on your cardiac medications before your next appointment, please call your pharmacy.   Lab work: None If you have labs (blood work) drawn today and your tests are completely normal, you will receive your results only by: [name] MyChart Message (if you have MyChart) OR . A paper copy in the mail If you have any lab test that is abnormal or we need to change your treatment, we will call you to review the results.  Testing/Procedures: Your physician has requested that you have an echocardiogram prior to seeing Dr. [name] in one year. Echocardiography is a painless test that uses sound waves to create images of your heart. It provides your doctor with information about the size and shape of your heart and how well your heart's chambers and valves are working. This procedure takes approximately one hour. There are no restrictions for this procedure.    Follow-Up: At [facility], you and your health needs are our priority.  As part of our continuing mission to provide you with exceptional heart care, we have created designated Provider Care Teams.  These Care Teams include your primary Cardiologist (physician) and Advanced  Practice Providers (APPs -  Physician Assistants and Nurse Practitioners) who all work together to provide you with the care you need, when you need it. You will need a follow up appointment in 12 months.  Please call our office 2 months in advance to schedule this appointment.  You may see Dr. [name] or one of the following Advanced Practice Providers on your designated Care Team:   Truitt Merle, NP Cecilie Kicks, NP . Kathyrn Drown, NP  Any Other Special Instructions Will Be Listed Below (If Applicable).       Signed, Sinclair Grooms, MD  12/06/2018 12:23 PM    [place]

## 2018-12-06 ENCOUNTER — Ambulatory Visit (INDEPENDENT_AMBULATORY_CARE_PROVIDER_SITE_OTHER): Payer: Medicare Other | Admitting: Interventional Cardiology

## 2018-12-06 ENCOUNTER — Encounter: Payer: Self-pay | Admitting: Interventional Cardiology

## 2018-12-06 VITALS — BP 132/70 | HR 65 | Ht <= 58 in | Wt 149.8 lb

## 2018-12-06 DIAGNOSIS — I5022 Chronic systolic (congestive) heart failure: Secondary | ICD-10-CM

## 2018-12-06 DIAGNOSIS — I10 Essential (primary) hypertension: Secondary | ICD-10-CM

## 2018-12-06 DIAGNOSIS — N183 Chronic kidney disease, stage 3 unspecified: Secondary | ICD-10-CM

## 2018-12-06 NOTE — Patient Instructions (Signed)
Medication Instructions:  Your physician recommends that you continue on your current medications as directed. Please refer to the Current Medication list given to you today.  If you need a refill on your cardiac medications before your next appointment, please call your pharmacy.   Lab work: None If you have labs (blood work) drawn today and your tests are completely normal, you will receive your results only by: [name] MyChart Message (if you have MyChart) OR . A paper copy in the mail If you have any lab test that is abnormal or we need to change your treatment, we will call you to review the results.  Testing/Procedures: Your physician has requested that you have an echocardiogram prior to seeing Dr. [name] in one year. Echocardiography is a painless test that uses sound waves to create images of your heart. It provides your doctor with information about the size and shape of your heart and how well your heart's chambers and valves are working. This procedure takes approximately one hour. There are no restrictions for this procedure.    Follow-Up: At [facility], you and your health needs are our priority.  As part of our continuing mission to provide you with exceptional heart care, we have created designated Provider Care Teams.  These Care Teams include your primary Cardiologist (physician) and Advanced Practice Providers (APPs -  Physician Assistants and Nurse Practitioners) who all work together to provide you with the care you need, when you need it. You will need a follow up appointment in 12 months.  Please call our office 2 months in advance to schedule this appointment.  You may see Dr. [name] or one of the following Advanced Practice Providers on your designated Care Team:   Truitt Merle, NP Cecilie Kicks, NP . Kathyrn Drown, NP  Any Other Special Instructions Will Be Listed Below (If Applicable).

## 2019-02-04 ENCOUNTER — Other Ambulatory Visit: Payer: Self-pay | Admitting: Nurse Practitioner

## 2019-02-04 DIAGNOSIS — G629 Polyneuropathy, unspecified: Secondary | ICD-10-CM

## 2019-02-04 DIAGNOSIS — I10 Essential (primary) hypertension: Secondary | ICD-10-CM

## 2019-02-23 ENCOUNTER — Encounter: Payer: Self-pay | Admitting: Nurse Practitioner

## 2019-03-06 ENCOUNTER — Encounter: Payer: Self-pay | Admitting: Nurse Practitioner

## 2019-03-06 ENCOUNTER — Other Ambulatory Visit: Payer: Self-pay

## 2019-03-06 ENCOUNTER — Ambulatory Visit (INDEPENDENT_AMBULATORY_CARE_PROVIDER_SITE_OTHER): Payer: Medicare Other | Admitting: Nurse Practitioner

## 2019-03-06 DIAGNOSIS — I10 Essential (primary) hypertension: Secondary | ICD-10-CM

## 2019-03-06 DIAGNOSIS — J4489 Other specified chronic obstructive pulmonary disease: Secondary | ICD-10-CM

## 2019-03-06 DIAGNOSIS — M2550 Pain in unspecified joint: Secondary | ICD-10-CM | POA: Diagnosis not present

## 2019-03-06 DIAGNOSIS — N183 Chronic kidney disease, stage 3 unspecified: Secondary | ICD-10-CM

## 2019-03-06 DIAGNOSIS — I890 Lymphedema, not elsewhere classified: Secondary | ICD-10-CM | POA: Diagnosis not present

## 2019-03-06 DIAGNOSIS — G629 Polyneuropathy, unspecified: Secondary | ICD-10-CM

## 2019-03-06 DIAGNOSIS — I5022 Chronic systolic (congestive) heart failure: Secondary | ICD-10-CM | POA: Diagnosis not present

## 2019-03-06 DIAGNOSIS — J449 Chronic obstructive pulmonary disease, unspecified: Secondary | ICD-10-CM | POA: Diagnosis not present

## 2019-03-06 DIAGNOSIS — G8929 Other chronic pain: Secondary | ICD-10-CM

## 2019-03-06 DIAGNOSIS — F22 Delusional disorders: Secondary | ICD-10-CM | POA: Diagnosis not present

## 2019-03-06 DIAGNOSIS — D509 Iron deficiency anemia, unspecified: Secondary | ICD-10-CM

## 2019-03-06 DIAGNOSIS — E559 Vitamin D deficiency, unspecified: Secondary | ICD-10-CM

## 2019-03-06 MED ORDER — POTASSIUM CHLORIDE CRYS ER 20 MEQ PO TBCR: 40.0000 meq | EXTENDED_RELEASE_TABLET | Freq: Two times a day (BID) | ORAL | 3 refills | Status: DC

## 2019-03-06 MED ORDER — CARVEDILOL 6.25 MG PO TABS: ORAL_TABLET | ORAL | 1 refills | Status: DC

## 2019-03-06 MED ORDER — OLANZAPINE 5 MG PO TABS: 5.0000 mg | ORAL_TABLET | Freq: Every day | ORAL | 1 refills | Status: DC

## 2019-03-06 MED ORDER — FERROUS SULFATE DRIED 200 (65 FE) MG PO TABS: 1.0000 | ORAL_TABLET | Freq: Two times a day (BID) | ORAL | 2 refills | Status: DC

## 2019-03-06 MED ORDER — FLUTICASONE-SALMETEROL 115-21 MCG/ACT IN AERO: INHALATION_SPRAY | RESPIRATORY_TRACT | 3 refills | Status: DC

## 2019-03-06 MED ORDER — TORSEMIDE 20 MG PO TABS: 40.0000 mg | ORAL_TABLET | Freq: Two times a day (BID) | ORAL | 1 refills | Status: DC

## 2019-03-06 MED ORDER — GABAPENTIN 100 MG PO CAPS: ORAL_CAPSULE | ORAL | 1 refills | Status: DC

## 2019-03-06 MED ORDER — HYDRALAZINE HCL 25 MG PO TABS: ORAL_TABLET | ORAL | 1 refills | Status: DC

## 2019-03-06 MED ORDER — VITAMIN D (ERGOCALCIFEROL) 1.25 MG (50000 UNIT) PO CAPS: 50000.0000 [IU] | ORAL_CAPSULE | ORAL | 1 refills | Status: DC

## 2019-03-06 NOTE — Progress Notes (Signed)
This service is provided via telemedicine  No vital signs collected/recorded due to the encounter was a telemedicine visit.   Location of patient (ex: home, work):  Home   Patient consents to a telephone visit:  Yes  Location of the provider (ex: office, home): office   Names of all persons participating in the telemedicine service and their role in the encounter:  Ruthell Rummage CMA, Sherrie Mustache, NP, Rico Ala, and nephew [name]  Time spent on call:  Ruthell Rummage CMA, spent 8  Minutes on phone with patient.   Virtual Visit via Telephone Note  I connected with [name] on 03/06/19 at  3:15 PM EDT by telephone and verified that I am speaking with the correct person using two identifiers.  Location: Patient: home Provider: office    I discussed the limitations, risks, security and privacy concerns of performing an evaluation and management service by telephone and the availability of in person appointments. I also discussed with the patient that there may be a patient responsible charge related to this service. The patient expressed understanding and agreed to proceed.     Careteam: Patient Care Team: Lauree Chandler, NP as PCP - General (Geriatric Medicine) [name], MD (Pulmonary Disease) Jacolyn Reedy, MD as Consulting Physician (Cardiology)  Advanced Directive information    Allergies  Allergen Reactions  . Aspirin Other (See Comments)    REACTION: upset stomach  . Codeine Other (See Comments)    REACTION: "MAKES MY BODY GO CRAZY; CRAMPS"  . Latex Nausea And Vomiting    Chief Complaint  Patient presents with  . Medical Management of Chronic Issues    4 month follow up would like to talk to provider about right hand      HPI: Patient is a 85 y.o. female for routine follow up.   She is followed by OT for LE lymphedema. Not currently going due to COVID-19 but once things improve they will go back.   Chronic LBP/generalized  joint pain - controlled on tramadol (needs refill) and applies topical agents. She has noticed increased swelling in L>R wrist. 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 Reports pain in right hand- hx of injury on the job and every now and then pain will "wake uP"  Report hand is swollen. Reports the whole hand is swollen. Feels warm but not hot. No redness. Hurts to move so she hold it with her other hand. Has not tired anything for pain.  No current injury. Having a hard time doing things around the house due to pain. No hx of gout. Started yesterday.  Osteoporosis- DXA 06/2017 revealed osteoporosis with T score -2.6. continues on Ca w D 1200mg  daily and Prolia injections. Was walking but not doing much activity now.   Chronic systolic HF - stable on coreg, torsemide + potassium and hydralazine.  Followed by cardio Dr Daneen Schick. EF 55-60% (EF 35-40% by 2D echo in 2015). Prior echo also revealed septal wall akinesis/severe hypokinesis; mod-sev diffuse hypokinesis.   Peripheral neuropathy - overall controlled on gabapentin. She has tingling in hands.  Venous insufficiency/chronic LE lymphedema -improved lymphedema; she is followed by outpatient rehab OT;stable on torsemide, clotrimazole/betamethasone cream, silvadene cream. She does not use her TED stockingsconsistently.   Psychosis/ hallucinations - per records has been stable and unchanged. no new behavioral disturbances and no formal psych evaluation. Family not willing for counseling at this time. She lives with family and gets meals on wheels.  she is taking zyprexa 5mg  qhs. States she does not use alprazolam, there has not been refill provided on this in a while.  COPD- using advair twice daily, unsure how much she is using albuterol due to being a poor historian. states she just takes it as she needs it. No recent flares or pulmonary issues.   GERD- no symptoms recently, previously on zantac which has recalled.    Anemia- using twice daily- "out of my iron"  Nephew helps with medications, reports he picks it up but she manages, states she is out of all her medications.  Review of Systems:  Review of Systems  Unable to perform ROS: Psychiatric disorder  poor historian.   Past Medical History:  Diagnosis Date  . Anemia, unspecified   . Anxiety   . Arthritis   . Asthma   . Delusions ([place])   . Depression   . Edema 2005   "since put the glass in them"   . GERD (gastroesophageal reflux disease)   . Lumbago   . Lymphedema   . Obstructive chronic bronchitis without exacerbation ([place])   . Other abnormal glucose   . 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 Systolic heart failure (HCC)    EF 35 to 40% per echo January 2015  . Unspecified essential hypertension   . Unspecified nonpsychotic mental disorder   . Unspecified venous (peripheral) insufficiency   . Unspecified venous (peripheral) insufficiency   . Unspecified vitamin D deficiency    Past Surgical History:  Procedure Laterality Date  . APPENDECTOMY    . CERVICAL SPINE SURGERY     x 2  . CHOLECYSTECTOMY    . ESOPHAGOGASTRODUODENOSCOPY N/A 04/05/2015   Procedure: ESOPHAGOGASTRODUODENOSCOPY (EGD);  Surgeon: [name], MD;  Location: [facility];  Service: Endoscopy;  Laterality: N/A;  . FLEXIBLE SIGMOIDOSCOPY N/A 04/05/2015   Procedure: FLEXIBLE SIGMOIDOSCOPY;  Surgeon: [name], MD;  Location: [facility];  Service: Endoscopy;  Laterality: N/A;  . HAND SURGERY    . LUMBAR DISC SURGERY    . PARTIAL HYSTERECTOMY    . TONSILLECTOMY     Social History:   reports that she has never smoked. She has never used smokeless tobacco. She reports that she does not drink alcohol or use drugs.  Family History  Problem Relation Age of Onset  . Diabetes Mother   . Arthritis Father   . Diabetes Paternal Grandfather        entire family on both sides  . Heart disease Maternal Aunt        entire family of both sides  . Cancer  Paternal Aunt        type unknown  . Diabetes Sister   . Breast cancer Cousin     Medications: Patient's Medications  New Prescriptions   No medications on file  Previous Medications   ACETAMINOPHEN (TYLENOL 8 HOUR) 650 MG CR TABLET    Take 1 tablet (650 mg total) by mouth 3 (three) times daily with meals.   ALBUTEROL (PROAIR HFA) 108 (90 BASE) MCG/ACT INHALER    INHALE 2 PUFFS EVERY 4 HOURS AS NEEDED FOR WHEEZING   ALPRAZOLAM (XANAX) 0.5 MG TABLET    Take 1/2 to 1 tablet by mouth three times daily as needed for nerves   ASCORBIC ACID (VITAMIN C) 1000 MG TABLET    2 by mouth twice daily before lunch and supper    CALCIUM-VITAMIN D (OSCAL WITH D) 500-200 MG-UNIT TABLET    Take 1 tablet by  mouth daily with breakfast.   CARVEDILOL (COREG) 6.25 MG TABLET    Take 1 tablet twice daily in lunch and supper.   CLOTRIMAZOLE-BETAMETHASONE (LOTRISONE) CREAM    Apply 1 application topically 2 (two) times daily. To your arm and leg rash   DENOSUMAB (PROLIA) 60 MG/ML SOSY INJECTION    Inject 60 mg into the skin every 6 (six) months.   FERROUS SULFATE DRIED 200 (65 FE) MG TABS    Take 1 tablet by mouth 2 (two) times daily before lunch and supper.   FLUTICASONE-SALMETEROL (ADVAIR HFA) 115-21 MCG/ACT INHALER    USE 2 INHALATIONS TWICE A DAY   GABAPENTIN (NEURONTIN) 100 MG CAPSULE    TAKE 2 CAPSULES BY MOUTH IN THE MORNING AND 2 CAPSULES AT BEDTIME   HYDRALAZINE (APRESOLINE) 25 MG TABLET    TAKE 1 TABLET(25 MG TOTAL) BY MOUTH TWICE DAILY AT 8 AM AND 10 PM   OLANZAPINE (ZYPREXA) 5 MG TABLET    Take 1 tablet (5 mg total) by mouth at bedtime.   POTASSIUM CHLORIDE SA (K-DUR,KLOR-CON) 20 MEQ TABLET    Take 2 tablets (40 mEq total) by mouth 2 (two) times daily before lunch and supper.   RANITIDINE (ZANTAC) 150 MG TABLET    Take 1 tablet (150 mg total) by mouth at bedtime.   THEOPHYLLINE (THEO-24) 200 MG 24 HR CAPSULE    Take 1 capsule (200 mg total) by mouth every morning.   TORSEMIDE (DEMADEX) 20 MG TABLET     Take 2 tablets (40 mg total) by mouth 2 (two) times daily. Take 2 tabs at 8 am and 10 pm   TRAMADOL (ULTRAM) 50 MG TABLET    Take 1 tablet (50 mg total) by mouth 3 (three) times daily as needed.   VITAMIN D, ERGOCALCIFEROL, (DRISDOL) 50000 UNITS CAPS CAPSULE    Take 50,000 Units every Monday by mouth.   Modified Medications   No medications on file  Discontinued Medications   No medications on file     Physical Exam: Unable due to televisit    Labs reviewed: Basic Metabolic Panel: Recent Labs    06/14/18 1210 11/04/18 1603 11/28/18 1544  NA 139 139 140  K 3.6 3.8 3.9  CL 99 101 106  CO2 30 30 25   GLUCOSE 118 77 81  BUN 26* 55* 25  CREATININE 1.46* 1.58* 1.30*  CALCIUM 10.1 9.2 9.6   Liver Function Tests: Recent Labs    11/04/18 1603  AST 17  ALT 9  BILITOT 0.4  PROT 8.3*   No results for input(s): LIPASE, AMYLASE in the last 8760 hours. No results for input(s): AMMONIA in the last 8760 hours. CBC: Recent Labs    06/14/18 1210 11/04/18 1603  WBC 6.9 5.2  NEUTROABS 5,382 3,037  HGB 10.4* 10.9*  HCT 33.3* 34.2*  MCV 86.3 83.8  PLT 247 252   Lipid Panel: No results for input(s): CHOL, HDL, LDLCALC, TRIG, CHOLHDL, LDLDIRECT in the last 8760 hours. TSH: No results for input(s): TSH in the last 8760 hours. A1C: Lab Results  Component Value Date   HGBA1C 5.5 04/14/2014     Assessment/Plan 1. Obstructive chronic bronchitis ([place]) -went over instructions of advair to use routinely (she is using PRN). She has not had any flares or worsening breathing issues.  - fluticasone-salmeterol (ADVAIR HFA) 115-21 MCG/ACT inhaler; USE 2 INHALATIONS TWICE A DAY  Dispense: 36 g; Refill: 3  2. Peripheral polyneuropathy Stable, continues on gabapentin - gabapentin (NEURONTIN) 100 MG capsule;  TAKE 2 CAPSULES BY MOUTH IN THE MORNING AND 2 CAPSULES AT BEDTIME  Dispense: 360 capsule; Refill: 1  3. Essential hypertension -does not take blood pressure at home, blood pressure  has been controlled.  - hydrALAZINE (APRESOLINE) 25 MG tablet; TAKE 1 TABLET(25 MG TOTAL) BY MOUTH TWICE DAILY AT 8 AM AND 10 PM  Dispense: 180 tablet; Refill: 1  4. Paranoid delusion ([place]) Stable, continues on Zyprexa  - OLANZapine (ZYPREXA) 5 MG tablet; Take 1 tablet (5 mg total) by mouth at bedtime.  Dispense: 90 tablet; Refill: 1  5. Chronic pain of multiple joints Worsening pain on right hand, reports occasional flare after injury in the past. To use tylenol 500 mg 1-2 tablets every 8 hours as needed pain. Can use muscle rub as needed  6. Chronic systolic heart failure (HCC) Continues to follow up with cardiology, stable at this time. - potassium chloride SA (K-DUR) 20 MEQ tablet; Take 2 tablets (40 mEq total) by mouth 2 (two) times daily before lunch and supper.  Dispense: 180 tablet; Refill: 3 - torsemide (DEMADEX) 20 MG tablet; Take 2 tablets (40 mg total) by mouth 2 (two) times daily. Take 2 tabs at 8 am and 10 pm  Dispense: 240 tablet; Refill: 1 - carvedilol (COREG) 6.25 MG tablet; Take 1 tablet twice daily in lunch and supper.  Dispense: 180 tablet; Refill: 1  7. Lymphedema of both lower extremities Stable, generally followed with OT - potassium chloride SA (K-DUR) 20 MEQ tablet; Take 2 tablets (40 mEq total) by mouth 2 (two) times daily before lunch and supper.  Dispense: 180 tablet; Refill: 3 - torsemide (DEMADEX) 20 MG tablet; Take 2 tablets (40 mg total) by mouth 2 (two) times daily. Take 2 tabs at 8 am and 10 pm  Dispense: 240 tablet; Refill: 1  8. CKD (chronic kidney disease) stage 3, GFR 30-59 ml/min (HCC) Encourage proper hydration and to avoid NSAIDS (Aleve, Advil, Motrin, Ibuprofen)   9. Vitamin D deficiency -will follow up vit D level at next OV - Vitamin D, Ergocalciferol, (DRISDOL) 1.25 MG (50000 UT) CAPS capsule; Take 1 capsule (50,000 Units total) by mouth every Monday.  Dispense: 12 capsule; Refill: 1  10. Iron deficiency anemia, unspecified iron deficiency  anemia type -no signs of bleeding, continues on iron supplement.  - Ferrous Sulfate Dried 200 (65 Fe) MG TABS; Take 1 tablet by mouth 2 (two) times daily before lunch and supper.  Dispense: 60 tablet; Refill: 2  Next appt: 05/05/2019 [name]. Harle Battiest  [facility] [phone]   Follow Up Instructions:    I discussed the assessment and treatment plan with the patient. The patient was provided an opportunity to ask questions and all were answered. The patient agreed with the plan and demonstrated an understanding of the instructions.   The patient was advised to call back or seek an in-person evaluation if the symptoms worsen or if the condition fails to improve as anticipated.  I provided 26 minutes of non-face-to-face time during this encounter.   JESSICA [name], NP  avs printed and mailed

## 2019-03-06 NOTE — Patient Instructions (Signed)
To use tylenol 500 mg 1-2 tablets every 8 hours as needed for pain.  To continue to elevate hand To use muscle rub (icyhot, bengay, biofreeze, aspercream) as needed  To check your medication list with medication at home to ensure compliance. Please take your medications as directed.   Follow up in 3 months for routine follow up. Please bring pill bottles with you to next office visit.

## 2019-03-11 ENCOUNTER — Other Ambulatory Visit: Payer: Self-pay | Admitting: Nurse Practitioner

## 2019-03-11 DIAGNOSIS — I5022 Chronic systolic (congestive) heart failure: Secondary | ICD-10-CM

## 2019-03-11 DIAGNOSIS — I890 Lymphedema, not elsewhere classified: Secondary | ICD-10-CM

## 2019-04-14 ENCOUNTER — Other Ambulatory Visit: Payer: Self-pay

## 2019-04-14 ENCOUNTER — Ambulatory Visit (INDEPENDENT_AMBULATORY_CARE_PROVIDER_SITE_OTHER): Payer: Medicare Other

## 2019-04-14 DIAGNOSIS — M810 Age-related osteoporosis without current pathological fracture: Secondary | ICD-10-CM | POA: Diagnosis not present

## 2019-04-14 MED ORDER — DENOSUMAB 60 MG/ML ~~LOC~~ SOSY
60.0000 mg | PREFILLED_SYRINGE | Freq: Once | SUBCUTANEOUS | Status: AC
  Administered 2019-04-14: 60 mg via SUBCUTANEOUS

## 2019-05-05 ENCOUNTER — Encounter: Payer: Medicare Other | Admitting: Family

## 2019-05-05 ENCOUNTER — Other Ambulatory Visit: Payer: Self-pay

## 2019-05-05 ENCOUNTER — Encounter: Payer: Self-pay | Admitting: Nurse Practitioner

## 2019-05-05 ENCOUNTER — Ambulatory Visit: Payer: Self-pay

## 2019-05-05 ENCOUNTER — Ambulatory Visit (INDEPENDENT_AMBULATORY_CARE_PROVIDER_SITE_OTHER): Payer: Medicare Other | Admitting: Nurse Practitioner

## 2019-05-05 VITALS — BP 150/78 | HR 58 | Temp 98.4°F | Ht <= 58 in | Wt 140.0 lb

## 2019-05-05 DIAGNOSIS — Z1211 Encounter for screening for malignant neoplasm of colon: Secondary | ICD-10-CM | POA: Diagnosis not present

## 2019-05-05 DIAGNOSIS — Z0000 Encounter for general adult medical examination without abnormal findings: Secondary | ICD-10-CM

## 2019-05-05 DIAGNOSIS — Z1212 Encounter for screening for malignant neoplasm of rectum: Secondary | ICD-10-CM

## 2019-05-05 NOTE — Progress Notes (Signed)
Subjective:   [name] is a 85 y.o. female who presents for Medicare Annual (Subsequent) preventive examination.  Review of Systems:   Cardiac Risk Factors include: advanced age (>59men, >3 women);sedentary lifestyle;hypertension     Objective:     Vitals: BP (!) 150/78 (BP Location: Right Arm, Patient Position: Sitting, Cuff Size: Normal)   Pulse (!) 58   Temp 98.4 F (36.9 C) (Oral)   Ht 4\' 10"  (1.473 m)   Wt 140 lb (63.5 kg)   SpO2 97%   BMI 29.26 kg/m   Body mass index is 29.26 kg/m.  Advanced Directives 05/05/2019 03/06/2019 11/04/2018 07/23/2018 06/14/2018 03/29/2018 09/18/2017  Does Patient Have a Medical Advance Directive? No No No Yes No No No  Type of Advance Directive - - Public librarian;Living will - - -  Does patient want to make changes to medical advance directive? - - - - - - -  Copy of [place] in Chart? - - - - - - -  Would patient like information on creating a medical advance directive? - No - Patient declined - - - Yes (MAU/Ambulatory/Procedural Areas - Information given) No - Patient declined    Tobacco Social History   Tobacco Use  Smoking Status Never Smoker  Smokeless Tobacco Never Used     Counseling given: Not Answered   Clinical Intake:  Pre-visit preparation completed: Yes  Pain : 0-10 Pain Score: 5  Pain Type: Chronic pain Pain Location: Back Pain Orientation: Lower, Mid, Upper Pain Descriptors / Indicators: Other (Comment)(hurts) Pain Onset: More than a month ago Pain Frequency: Constant Pain Relieving Factors: asa if it gets bad Effect of Pain on Daily Activities: none  Pain Relieving Factors: asa if it gets bad  BMI - recorded: 29.26 Nutritional Status: BMI 25 -29 Overweight Nutritional Risks: None Diabetes: No CBG done?: No Did pt. bring in CBG monitor from home?: No  How often do you need to have someone help you when you read instructions, pamphlets, or other written materials  from your doctor or pharmacy?: 3 - Sometimes What is the last grade level you completed in school?: 12th grade  Interpreter Needed?: No     Past Medical History:  Diagnosis Date  . Anemia, unspecified   . Anxiety   . Arthritis   . Asthma   . Delusions ([place])   . Depression   . Edema 2005   "since put the glass in them"   . GERD (gastroesophageal reflux disease)   . Lumbago   . Lymphedema   . Obstructive chronic bronchitis without exacerbation ([facility])   . Other abnormal glucose   . 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 Systolic heart failure ([facility])    EF 35 to 40% per echo January 2015  . Unspecified essential hypertension   . Unspecified nonpsychotic mental disorder   . Unspecified venous (peripheral) insufficiency   . Unspecified venous (peripheral) insufficiency   . Unspecified vitamin D deficiency    Past Surgical History:  Procedure Laterality Date  . APPENDECTOMY    . CERVICAL SPINE SURGERY     x 2  . CHOLECYSTECTOMY    . ESOPHAGOGASTRODUODENOSCOPY N/A 04/05/2015   Procedure: ESOPHAGOGASTRODUODENOSCOPY (EGD);  Surgeon: [name], MD;  Location: [facility];  Service: Endoscopy;  Laterality: N/A;  . FLEXIBLE SIGMOIDOSCOPY N/A 04/05/2015   Procedure: FLEXIBLE SIGMOIDOSCOPY;  Surgeon: [name], MD;  Location: [facility];  Service: Endoscopy;  Laterality: N/A;  . HAND SURGERY    .  LUMBAR DISC SURGERY    . PARTIAL HYSTERECTOMY    . TONSILLECTOMY     Family History  Problem Relation Age of Onset  . Diabetes Mother   . Arthritis Father   . Diabetes Paternal Grandfather        entire family on both sides  . Heart disease Maternal Aunt        entire family of both sides  . Cancer Paternal Aunt        type unknown  . Diabetes Sister   . Breast cancer Cousin    Social History   Socioeconomic History  . Marital status: Widowed    Spouse name: Not on file  . Number of children: 0  . Years of education: Not on file  . Highest education level: Not on  file  Occupational History  . Occupation: retired  Scientific laboratory technician  . Financial resource strain: Not hard at all  . Food insecurity    Worry: Never true    Inability: Never true  . Transportation needs    Medical: No    Non-medical: No  Tobacco Use  . Smoking status: Never Smoker  . Smokeless tobacco: Never Used  Substance and Sexual Activity  . Alcohol use: No  . Drug use: No  . Sexual activity: Not Currently  Lifestyle  . Physical activity    Days per week: 7 days    Minutes per session: 10 min  . Stress: Not at all  Relationships  . Social connections    Talks on phone: More than three times a week    Gets together: More than three times a week    Attends religious service: 1 to 4 times per year    Active member of club or organization: No    Attends meetings of clubs or organizations: Never    Relationship status: Widowed  Other Topics Concern  . Not on file  Social History Narrative   Diet? Good, low appetite      Do you drink/eat things with caffeine? no      Marital status?                  widowed                  What year were you married?      Do you live in a house, apartment, assisted living, condo, trailer, etc.? house      Is it one or more stories? 1 story      How many persons live in your home? Only me      Do you have any pets in your home? (please list) no      Current or past profession: lab tech      Do you exercise?        some                              Type & how often? Foot rolls, some walking      Do you have a living will? yes      Do you have a DNR form?    no                              If not, do you want to discuss one?      Do you have signed POA/HPOA for forms? yes  Outpatient Encounter Medications as of 05/05/2019  Medication Sig  . acetaminophen (TYLENOL 8 HOUR) 650 MG CR tablet Take 1 tablet (650 mg total) by mouth 3 (three) times daily with meals.  [name] albuterol (PROAIR HFA) 108 (90 Base) MCG/ACT inhaler INHALE 2 PUFFS  EVERY 4 HOURS AS NEEDED FOR WHEEZING  . ALPRAZolam (XANAX) 0.5 MG tablet Take 1/2 to 1 tablet by mouth three times daily as needed for nerves  . Ascorbic Acid (VITAMIN C) 1000 MG tablet 2 by mouth twice daily before lunch and supper   . calcium-vitamin D (OSCAL WITH D) 500-200 MG-UNIT tablet Take 1 tablet by mouth daily with breakfast.  . carvedilol (COREG) 6.25 MG tablet Take 1 tablet twice daily in lunch and supper.  . clotrimazole-betamethasone (LOTRISONE) cream Apply 1 application topically 2 (two) times daily. To your arm and leg rash  . denosumab (PROLIA) 60 MG/ML SOSY injection Inject 60 mg into the skin every 6 (six) months.  . Ferrous Sulfate Dried 200 (65 Fe) MG TABS Take 1 tablet by mouth 2 (two) times daily before lunch and supper.  . fluticasone-salmeterol (ADVAIR HFA) 115-21 MCG/ACT inhaler USE 2 INHALATIONS TWICE A DAY  . gabapentin (NEURONTIN) 100 MG capsule TAKE 2 CAPSULES BY MOUTH IN THE MORNING AND 2 CAPSULES AT BEDTIME  . hydrALAZINE (APRESOLINE) 25 MG tablet TAKE 1 TABLET(25 MG TOTAL) BY MOUTH TWICE DAILY AT 8 AM AND 10 PM  . OLANZapine (ZYPREXA) 5 MG tablet Take 1 tablet (5 mg total) by mouth at bedtime.  . potassium chloride SA (K-DUR) 20 MEQ tablet Take 2 tablets (40 mEq total) by mouth 2 (two) times daily before lunch and supper.  . torsemide (DEMADEX) 20 MG tablet Take 2 tablets (40 mg total) by mouth 2 (two) times daily. Take 2 tabs at 8 am and 10 pm  . traMADol (ULTRAM) 50 MG tablet Take 1 tablet (50 mg total) by mouth 3 (three) times daily as needed.  . Vitamin D, Ergocalciferol, (DRISDOL) 1.25 MG (50000 UT) CAPS capsule Take 1 capsule (50,000 Units total) by mouth every Monday.   No facility-administered encounter medications on file as of 05/05/2019.     Activities of Daily Living In your present state of health, do you have any difficulty performing the following activities: 05/05/2019  Hearing? N  Vision? Y  Difficulty concentrating or making decisions? Y   Walking or climbing stairs? N  Dressing or bathing? N  Doing errands, shopping? Y  Comment nephew helps appts  Preparing Food and eating ? N  Using the Toilet? N  In the past six months, have you accidently leaked urine? N  Do you have problems with loss of bowel control? N  Managing your Medications? Y  Comment nephew helps  Managing your Finances? Y  Comment nephew helps  Housekeeping or managing your Housekeeping? Y  Comment nephew helps  Some recent data might be hidden    Patient Care Team: Lauree Chandler, NP as PCP - General (Geriatric Medicine) [name], MD (Pulmonary Disease) Jacolyn Reedy, MD as Consulting Physician (Cardiology)    Assessment:   This is a routine wellness examination for [place].  Exercise Activities and Dietary recommendations Current Exercise Habits: Home exercise routine, Type of exercise: calisthenics;stretching, Time (Minutes): 20, Frequency (Times/Week): 7, Weekly Exercise (Minutes/Week): 140, Intensity: Mild  Goals    . <enter goal here>     Starting 11/24/16, I will maintain my current lifestyle.        Fall Risk  Fall Risk  05/05/2019 03/06/2019 11/04/2018 06/14/2018 03/29/2018  Falls in the past year? 0 0 0 No No  Number falls in past yr: 0 0 0 - -  Injury with Fall? 0 0 0 - -  Comment - - - - -  Risk Factor Category  - - - - -  Risk for fall due to : - - - - -  Risk for fall due to: Comment - - - - -  Follow up - - - - -   Is the patient's home free of loose throw rugs in walkways, pet beds, electrical cords, etc?   yes      Grab bars in the bathroom? yes      Handrails on the stairs?   no stairs      Adequate lighting?   yes  Timed Get Up and Go performed: na  Depression Screen PHQ 2/9 Scores 05/05/2019 03/29/2018 12/18/2016 11/24/2016  PHQ - 2 Score 0 0 0 0     Cognitive Function MMSE - Mini Mental State Exam 05/05/2019 03/29/2018 11/24/2016  Orientation to time 3 5 5   Orientation to Place 3 5 4   Registration 3 3 3    Attention/ Calculation 0 2 0  Recall 2 1 3   Language- name 2 objects 2 2 2   Language- repeat 1 1 1   Language- follow 3 step command 3 3 1   Language- read & follow direction 0 1 1  Write a sentence 0 0 0  Copy design 0 1 0  Total score 17 24 20         Immunization History  Administered Date(s) Administered  . Influenza Split 07/31/2011  . Influenza Whole 09/30/2009, 07/30/2010  . Influenza,inj,Quad PF,6+ Mos 08/13/2015, 07/03/2017, 08/16/2018  . Influenza-Unspecified 07/30/2016  . Pneumococcal Conjugate-13 11/24/2016  . Pneumococcal Polysaccharide-23 08/16/2018  . Tdap 04/07/2015  . Varicella 08/25/2011    Qualifies for Shingles Vaccine? yes  Screening Tests Health Maintenance  Topic Date Due  . COLONOSCOPY  08/25/2015  . INFLUENZA VACCINE  05/31/2019  . TETANUS/TDAP  04/06/2025  . DEXA SCAN  Completed  . PNA vac Low Risk Adult  Completed    Cancer Screenings: Lung: Low Dose CT Chest recommended if Age 65-80 years, 30 pack-year currently smoking OR have quit w/in 15years. Patient does not qualify. Breast:  Up to date on Mammogram? Aged out Up to date of Bone Density/Dexa? Yes Colorectal: need follow up. Given referral.  Additional Screenings: Hepatitis C Screening: n.a     Plan:      I have personally reviewed and noted the following in the patient's chart:   . Medical and social history . Use of alcohol, tobacco or illicit drugs  . Current medications and supplements . Functional ability and status . Nutritional status . Physical activity . Advanced directives . List of other physicians . Hospitalizations, surgeries, and ER visits in previous 12 months . Vitals . Screenings to include cognitive, depression, and falls . Referrals and appointments  In addition, I have reviewed and discussed with patient certain preventive protocols, quality metrics, and best practice recommendations. A written personalized care plan for preventive services as well as  general preventive health recommendations were provided to patient.     Lauree Chandler, NP  05/05/2019

## 2019-05-05 NOTE — Patient Instructions (Addendum)
[name] , Thank you for taking time to come for your Medicare Wellness Visit. I appreciate your ongoing commitment to your health goals. Please review the following plan we discussed and let me know if I can assist you in the future.   Screening recommendations/referrals: Colonoscopy OVER DUE- referral sent  Mammogram- aged out Bone Density - DUE 07/2019 Recommended yearly ophthalmology/optometry visit for glaucoma screening and checkup Recommended yearly dental visit for hygiene and checkup  Vaccinations: Influenza vaccine- due 05/2019 Pneumococcal vaccine- up to date  Tdap vaccine up to date Shingles vaccine- has been sent to pharmacy- please go to pharmacy and get vaccine there   Advanced directives: on file  Conditions/risks identified: worsening memory loss, needs help remembering appts and medications.   TO MAKE APPT WITH EYE DOCTOR for eye exam.   Next appointment: 1 year.    Preventive Care 16 Years and Older, Female Preventive care refers to lifestyle choices and visits with your health care provider that can promote health and wellness. What does preventive care include?  A yearly physical exam. This is also called an annual well check.  Dental exams once or twice a year.  Routine eye exams. Ask your health care provider how often you should have your eyes checked.  Personal lifestyle choices, including:  Daily care of your teeth and gums.  Regular physical activity.  Eating a healthy diet.  Avoiding tobacco and drug use.  Limiting alcohol use.  Practicing safe sex.  Taking low-dose aspirin every day.  Taking vitamin and mineral supplements as recommended by your health care provider. What happens during an annual well check? The services and screenings done by your health care provider during your annual well check will depend on your age, overall health, lifestyle risk factors, and family history of disease. Counseling  Your health care provider may  ask you questions about your:  Alcohol use.  Tobacco use.  Drug use.  Emotional well-being.  Home and relationship well-being.  Sexual activity.  Eating habits.  History of falls.  Memory and ability to understand (cognition).  Work and work Statistician.  Reproductive health. Screening  You may have the following tests or measurements:  Height, weight, and BMI.  Blood pressure.  Lipid and cholesterol levels. These may be checked every 5 years, or more frequently if you are over 45 years old.  Skin check.  Lung cancer screening. You may have this screening every year starting at age 70 if you have a 30-pack-year history of smoking and currently smoke or have quit within the past 15 years.  Fecal occult blood test (FOBT) of the stool. You may have this test every year starting at age 96.  Flexible sigmoidoscopy or colonoscopy. You may have a sigmoidoscopy every 5 years or a colonoscopy every 10 years starting at age 27.  Hepatitis C blood test.  Hepatitis B blood test.  Sexually transmitted disease (STD) testing.  Diabetes screening. This is done by checking your blood sugar (glucose) after you have not eaten for a while (fasting). You may have this done every 1-3 years.  Bone density scan. This is done to screen for osteoporosis. You may have this done starting at age 28.  Mammogram. This may be done every 1-2 years. Talk to your health care provider about how often you should have regular mammograms. Talk with your health care provider about your test results, treatment options, and if necessary, the need for more tests. Vaccines  Your health care provider may recommend  certain vaccines, such as:  Influenza vaccine. This is recommended every year.  Tetanus, diphtheria, and acellular pertussis (Tdap, Td) vaccine. You may need a Td booster every 10 years.  Zoster vaccine. You may need this after age 44.  Pneumococcal 13-valent conjugate (PCV13) vaccine. One  dose is recommended after age 61.  Pneumococcal polysaccharide (PPSV23) vaccine. One dose is recommended after age 66. Talk to your health care provider about which screenings and vaccines you need and how often you need them. This information is not intended to replace advice given to you by your health care provider. Make sure you discuss any questions you have with your health care provider. Document Released: 11/12/2015 Document Revised: 07/05/2016 Document Reviewed: 08/17/2015 Elsevier Interactive Patient Education  2017 [place] Prevention in the Home Falls can cause injuries. They can happen to people of all ages. There are many things you can do to make your home safe and to help prevent falls. What can I do on the outside of my home?  Regularly fix the edges of walkways and driveways and fix any cracks.  Remove anything that might make you trip as you walk through a door, such as a raised step or threshold.  Trim any bushes or trees on the path to your home.  Use bright outdoor lighting.  Clear any walking paths of anything that might make someone trip, such as rocks or tools.  Regularly check to see if handrails are loose or broken. Make sure that both sides of any steps have handrails.  Any raised decks and porches should have guardrails on the edges.  Have any leaves, snow, or ice cleared regularly.  Use sand or salt on walking paths during winter.  Clean up any spills in your garage right away. This includes oil or grease spills. What can I do in the bathroom?  Use night lights.  Install grab bars by the toilet and in the tub and shower. Do not use towel bars as grab bars.  Use non-skid mats or decals in the tub or shower.  If you need to sit down in the shower, use a plastic, non-slip stool.  Keep the floor dry. Clean up any water that spills on the floor as soon as it happens.  Remove soap buildup in the tub or shower regularly.  Attach bath  mats securely with double-sided non-slip rug tape.  Do not have throw rugs and other things on the floor that can make you trip. What can I do in the bedroom?  Use night lights.  Make sure that you have a light by your bed that is easy to reach.  Do not use any sheets or blankets that are too big for your bed. They should not hang down onto the floor.  Have a firm chair that has side arms. You can use this for support while you get dressed.  Do not have throw rugs and other things on the floor that can make you trip. What can I do in the kitchen?  Clean up any spills right away.  Avoid walking on wet floors.  Keep items that you use a lot in easy-to-reach places.  If you need to reach something above you, use a strong step stool that has a grab bar.  Keep electrical cords out of the way.  Do not use floor polish or wax that makes floors slippery. If you must use wax, use non-skid floor wax.  Do not have throw rugs and other  things on the floor that can make you trip. What can I do with my stairs?  Do not leave any items on the stairs.  Make sure that there are handrails on both sides of the stairs and use them. Fix handrails that are broken or loose. Make sure that handrails are as long as the stairways.  Check any carpeting to make sure that it is firmly attached to the stairs. Fix any carpet that is loose or worn.  Avoid having throw rugs at the top or bottom of the stairs. If you do have throw rugs, attach them to the floor with carpet tape.  Make sure that you have a light switch at the top of the stairs and the bottom of the stairs. If you do not have them, ask someone to add them for you. What else can I do to help prevent falls?  Wear shoes that:  Do not have high heels.  Have rubber bottoms.  Are comfortable and fit you well.  Are closed at the toe. Do not wear sandals.  If you use a stepladder:  Make sure that it is fully opened. Do not climb a closed  stepladder.  Make sure that both sides of the stepladder are locked into place.  Ask someone to hold it for you, if possible.  Clearly mark and make sure that you can see:  Any grab bars or handrails.  First and last steps.  Where the edge of each step is.  Use tools that help you move around (mobility aids) if they are needed. These include:  Canes.  Walkers.  Scooters.  Crutches.  Turn on the lights when you go into a dark area. Replace any light bulbs as soon as they burn out.  Set up your furniture so you have a clear path. Avoid moving your furniture around.  If any of your floors are uneven, fix them.  If there are any pets around you, be aware of where they are.  Review your medicines with your doctor. Some medicines can make you feel dizzy. This can increase your chance of falling. Ask your doctor what other things that you can do to help prevent falls. This information is not intended to replace advice given to you by your health care provider. Make sure you discuss any questions you have with your health care provider. Document Released: 08/12/2009 Document Revised: 03/23/2016 Document Reviewed: 11/20/2014 Elsevier Interactive Patient Education  2017 [employer].

## 2019-06-09 ENCOUNTER — Encounter: Payer: Self-pay | Admitting: Nurse Practitioner

## 2019-06-09 ENCOUNTER — Other Ambulatory Visit: Payer: Self-pay

## 2019-06-09 ENCOUNTER — Ambulatory Visit (INDEPENDENT_AMBULATORY_CARE_PROVIDER_SITE_OTHER): Payer: Medicare Other | Admitting: Nurse Practitioner

## 2019-06-09 VITALS — BP 174/88 | HR 74 | Temp 98.5°F | Ht <= 58 in | Wt 142.0 lb

## 2019-06-09 DIAGNOSIS — N183 Chronic kidney disease, stage 3 unspecified: Secondary | ICD-10-CM

## 2019-06-09 DIAGNOSIS — M810 Age-related osteoporosis without current pathological fracture: Secondary | ICD-10-CM

## 2019-06-09 DIAGNOSIS — I890 Lymphedema, not elsewhere classified: Secondary | ICD-10-CM | POA: Diagnosis not present

## 2019-06-09 DIAGNOSIS — M2550 Pain in unspecified joint: Secondary | ICD-10-CM

## 2019-06-09 DIAGNOSIS — I5022 Chronic systolic (congestive) heart failure: Secondary | ICD-10-CM

## 2019-06-09 DIAGNOSIS — F22 Delusional disorders: Secondary | ICD-10-CM | POA: Diagnosis not present

## 2019-06-09 DIAGNOSIS — D472 Monoclonal gammopathy: Secondary | ICD-10-CM

## 2019-06-09 DIAGNOSIS — G8929 Other chronic pain: Secondary | ICD-10-CM

## 2019-06-09 DIAGNOSIS — I10 Essential (primary) hypertension: Secondary | ICD-10-CM

## 2019-06-09 MED ORDER — TRAMADOL HCL 50 MG PO TABS: 50.0000 mg | ORAL_TABLET | Freq: Three times a day (TID) | ORAL | 0 refills | Status: DC | PRN

## 2019-06-09 NOTE — Progress Notes (Signed)
Careteam: Patient Care Team: Lauree Chandler, NP as PCP - General (Geriatric Medicine) [name], MD (Pulmonary Disease) Jacolyn Reedy, MD as Consulting Physician (Cardiology)  Advanced Directive information Does Patient Have a Medical Advance Directive?: No, Would patient like information on creating a medical advance directive?: No - Patient declined  Allergies  Allergen Reactions   Aspirin Other (See Comments)    REACTION: upset stomach   Codeine Other (See Comments)    REACTION: "MAKES MY BODY GO CRAZY; CRAMPS"   Latex Nausea And Vomiting    Chief Complaint  Patient presents with   Medical Management of Chronic Issues    3 month follow-up    Anorexia    Patient c/o decreased appetite    Back Pain    Patient c/o ongoing back pain, geting worse    Medication Management    RX request for nausea. Patient c/o daily feeling of nausea    Quality Metric Gaps    Discuss need for colonoscopy      HPI: Patient is a 85 y.o. female seen in the office today for routine follow up  Hypertension- blood pressure elevated today, nephew reports it may have been due to what she ate yesterday. Taking coreg twice daily, hydralazine 25 mg by mouth twice daily   Chronic LBP/generalized joint pain - reports worsening of back pain, states she has been taking advil. Previously controlled on tramadol and topical agents. Tramadol has not been refilled since January. She is out of this. Also not using topicals   Osteoporosis-DXA 06/2017 revealed osteoporosis with T score -2.6. continues on Ca w D 1223m daily and Prolia injections. Was walking but not doing much activity now.   Chronic systolic HF - stable on coreg, torsemide, potassium and hydralazine. Followed by cardio Dr HDaneen Schick EF 55-60% (EF 35-40% by 2D echo in 2015). Prior echo also revealed septal wall akinesis/severe hypokinesis; mod-sev diffuse hypokinesis. Reports it has been a while since follow up.    Peripheral neuropathy - overall controlled on gabapentin.   Venous insufficiency/chronic LE lymphedema -improved lymphedema; she is followed by outpatient rehab OT;stable on torsemide, clotrimazole/betamethasone cream, silvadene cream. She does not use her TED stockingsconsistently.  Unable to get legs wrapped due to COVID. No skin breakdown, rashes or sores.   Psychosis/ hallucinations -per records has been stable andunchanged. no new behavioral disturbances and no formal psych evaluation. Family not willing for counseling at this time. She liveswith familyand gets meals on wheels. she is taking zyprexa [id number]. States she does not use alprazolam, there has not been refill provided on this in a while.  COPD- controlled using advair twice daily,  No recent flares or pulmonary issues.   GERD- has not been on medication since zantac has been recalled   Anemia- stable on last labs, not been on iron for over 8 months  Nephew helps with medications, reports he picks it up but she manages, states she is out of all her medications.  Pt reports decrease appetite but nephew states she eats good when he is around and when food is provided   Nephew started getting involved 5 years ago but took several years to really know what was going on and 3 years ago was when he started getting more involved.    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   Systolic heart failure (HCC)    EF 35 to 40% per echo January 2015   Unspecified essential hypertension    Unspecified  nonpsychotic mental disorder    Unspecified venous (peripheral) insufficiency    Unspecified venous (peripheral) insufficiency    Unspecified vitamin D deficiency    Past Surgical History:  Procedure Laterality Date   APPENDECTOMY     CERVICAL SPINE SURGERY     x 2   CHOLECYSTECTOMY     ESOPHAGOGASTRODUODENOSCOPY N/A 04/05/2015   Procedure: ESOPHAGOGASTRODUODENOSCOPY (EGD);  Surgeon: [name], MD;  Location: [facility];  Service: Endoscopy;  Laterality: N/A;   FLEXIBLE SIGMOIDOSCOPY N/A 04/05/2015   Procedure: FLEXIBLE SIGMOIDOSCOPY;  Surgeon: [name], MD;  Location: [facility];  Service: Endoscopy;  Laterality: N/A;   HAND SURGERY     LUMBAR DISC SURGERY     PARTIAL HYSTERECTOMY     TONSILLECTOMY     Social History:   reports that she has never smoked. She has never used smokeless tobacco. She reports that she does not drink alcohol or use drugs.  Family History  Problem Relation Age of Onset   Diabetes Mother    Arthritis Father    Diabetes Paternal Grandfather        entire family on both sides   Heart disease Maternal Aunt        entire family of both sides   Cancer Paternal Aunt        type unknown   Diabetes Sister    Breast cancer Cousin     Medications: Patient's Medications  New Prescriptions   No medications on file  Previous Medications   ACETAMINOPHEN (TYLENOL 8 HOUR) 650 MG CR TABLET    Take 1 tablet (650 mg total) by mouth 3 (three) times daily with meals.   ALBUTEROL (PROAIR HFA) 108 (90 BASE) MCG/ACT INHALER    INHALE 2 PUFFS EVERY 4 HOURS AS NEEDED FOR WHEEZING   ALPRAZOLAM (XANAX) 0.5 MG TABLET    Take 1/2 to 1 tablet by mouth three times daily as needed for nerves   ASCORBIC ACID (VITAMIN C) 1000 MG TABLET    2 by mouth twice daily before lunch and supper    CALCIUM-VITAMIN D (OSCAL WITH D) 500-200 MG-UNIT TABLET    Take 1 tablet by mouth daily with breakfast.   CARVEDILOL (COREG) 6.25 MG TABLET    Take 1 tablet twice  daily in lunch and supper.   CLOTRIMAZOLE-BETAMETHASONE (LOTRISONE) CREAM    Apply 1 application topically 2 (two) times daily. To your arm and leg rash   DENOSUMAB (PROLIA) 60 MG/ML SOSY INJECTION    Inject 60 mg into the skin every 6 (six) months.   FERROUS SULFATE DRIED 200 (65 FE) MG TABS    Take 1 tablet by mouth 2 (two) times daily before lunch and supper.   FLUTICASONE-SALMETEROL (ADVAIR HFA) 115-21 MCG/ACT INHALER    USE 2 INHALATIONS TWICE A DAY   GABAPENTIN (NEURONTIN) 100 MG CAPSULE    TAKE 2 CAPSULES BY MOUTH IN THE MORNING AND 2 CAPSULES AT BEDTIME   HYDRALAZINE (APRESOLINE) 25  MG TABLET    TAKE 1 TABLET(25 MG TOTAL) BY MOUTH TWICE DAILY AT 8 AM AND 10 PM   OLANZAPINE (ZYPREXA) 5 MG TABLET    Take 1 tablet (5 mg total) by mouth at bedtime.   POTASSIUM CHLORIDE SA (K-DUR) 20 MEQ TABLET    Take 2 tablets (40 mEq total) by mouth 2 (two) times daily before lunch and supper.   TORSEMIDE (DEMADEX) 20 MG TABLET    Take 2 tablets (40 mg total) by mouth 2 (two) times daily. Take 2 tabs at 8 am and 10 pm   TRAMADOL (ULTRAM) 50 MG TABLET    Take 1 tablet (50 mg total) by mouth 3 (three) times daily as needed.   VITAMIN D, ERGOCALCIFEROL, (DRISDOL) 1.25 MG (50000 UT) CAPS CAPSULE    Take 1 capsule (50,000 Units total) by mouth every Monday.  Modified Medications   No medications on file  Discontinued Medications   No medications on file    Physical Exam:  Vitals:   06/09/19 1520  BP: (!) 180/90  Pulse: 74  Temp: 98.5 F (36.9 C)  TempSrc: Oral  SpO2: 98%  Weight: 142 lb (64.4 kg)  Height: _0  (1.473 m)   Body mass index is 29.68 kg/m. Wt Readings from Last 3 Encounters:  06/09/19 142 lb (64.4 kg)  05/05/19 140 lb (63.5 kg)  12/06/18 149 lb 12.8 oz (67.9 kg)    Physical Exam Constitutional:      Appearance: Normal appearance. She is well-developed.  HENT:     Mouth/Throat:     Mouth: Mucous membranes are dry.     Pharynx: No oropharyngeal exudate.  Eyes:     General:  No scleral icterus.    Pupils: Pupils are equal, round, and reactive to light.  Neck:     Musculoskeletal: Neck supple.     Thyroid: No thyromegaly.     Vascular: No carotid bruit.     Trachea: No tracheal deviation.  Cardiovascular:     Rate and Rhythm: Normal rate and regular rhythm.     Heart sounds: Murmur (1/6 SEM) present. No friction rub. No gallop.      Comments: L>RLE lymphedema with chronic venous stasis changes; no weeping; no calf TTP b/l Pulmonary:     Effort: Pulmonary effort is normal. No respiratory distress.     Breath sounds: Normal breath sounds. No stridor. No wheezing or rales.  Abdominal:     General: Bowel sounds are normal. There is no distension.     Palpations: Abdomen is soft. Abdomen is not rigid. There is no hepatomegaly or mass.     Tenderness: There is no abdominal tenderness. There is no guarding or rebound.     Hernia: No hernia is present.  Musculoskeletal:     Right lower leg: Edema present.     Left lower leg: Edema present.  Lymphadenopathy:     Cervical: No cervical adenopathy.  Skin:    General: Skin is warm and dry.  Neurological:     Mental Status: She is alert.     Deep Tendon Reflexes: Reflexes are normal and symmetric.  Psychiatric:        Behavior: Behavior normal.        Cognition and Memory: Cognition is impaired. Memory is impaired. She exhibits impaired recent memory.     Labs reviewed: Basic Metabolic Panel: Recent Labs    06/14/18 1210 11/04/18 1603 11/28/18 1544  NA 139 139 140  K 3.6 3.8 3.9  CL 99 101 106  CO2 _0 GLUCOSE 118 77 81  BUN 26* 55* 25  CREATININE 1.46* 1.58* 1.30*  CALCIUM 10.1 9.2 9.6   Liver Function Tests: Recent Labs    11/04/18 1603  AST 17  ALT 9  BILITOT 0.4  PROT 8.3*   No results for input(s): LIPASE, AMYLASE in the last 8760 hours. No results for input(s): AMMONIA in the last 8760 hours. CBC: Recent Labs    06/14/18 1210 11/04/18 1603  WBC 6.9 5.2  NEUTROABS 5,382  3,037  HGB 10.4* 10.9*  HCT 33.3* 34.2*  MCV 86.3 83.8  PLT 247 252   Lipid Panel: No results for input(s): CHOL, HDL, LDLCALC, TRIG, CHOLHDL, LDLDIRECT in the last 8760 hours. TSH: No results for input(s): TSH in the last 8760 hours. A1C: Lab Results  Component Value Date   HGBA1C 5.5 04/14/2014     Assessment/Plan 1. Chronic pain of multiple joints Last fill on tramadol was January 6th, called pharmacy to verify as it is not coming up in [place]. Encouraged NOT to use advil due to CKD. Encouraged tylenol can use 500 mg 1-2 tablets every 8 hours as needed. Can take routinely if needed  2. Senile osteoporosis Continues on prolia with Vit D.  3. Essential hypertension Unsure if she has taken medication today. Encouraged to check blood pressure at home, low sodium diet and take medication prior to office visit. Will follow up in 2 weeks on this - CMP with eGFR([facility]) - CBC with Differential/Platelet  4. Paranoid delusion ([place]) Stable mood. Will continue current regimen.   5. Chronic systolic heart failure (HCC) Stable, without significant increase in edema (not getting wraps due to COVID), shortness of breath or fluid retention. Continues on demadex with potassium supplement, coreg and hydralazine. Up to date on cardiology follow up.  - CBC with Differential/Platelet  6. Lymphedema of both lower extremities Stable despite not getting legs wrapped. Encouraged elevation and dietary modifications.   7. CKD (chronic kidney disease) stage 3, GFR 30-59 ml/min (HCC) -Encourage proper hydration and to avoid NSAIDS (Aleve, Advil, Motrin, Ibuprofen)  - CMP with eGFR([facility])  8. MGUS (monoclonal gammopathy of unknown significance) -no recent follow up, nephew would like her to be reevaluated by hematology at this time - Ambulatory referral to Hematology  Next appt: 2 weeks.  [name]. [place], [facility] [phone]

## 2019-06-09 NOTE — Patient Instructions (Addendum)
Can use Pepcid over the counter for indigestion   To STOP any advil, motril, ibuprofen or aleve  to use tylenol 500 mg 1-2 tablets every 8 hours for pain If she continue to have pain can use tramadol 50 mg tablet.   To use heat every 8 hours as needed with muscle rub AFTER heat   Food Choices for Gastroesophageal Reflux Disease, Adult When you have gastroesophageal reflux disease (GERD), the foods you eat and your eating habits are very important. Choosing the right foods can help ease your discomfort. Think about working with a nutrition specialist (dietitian) to help you make good choices. What are tips for following this plan?  Meals  Choose healthy foods that are low in fat, such as fruits, vegetables, whole grains, low-fat dairy products, and lean meat, fish, and poultry.  Eat small meals often instead of 3 large meals a day. Eat your meals slowly, and in a place where you are relaxed. Avoid bending over or lying down until 2-3 hours after eating.  Avoid eating meals 2-3 hours before bed.  Avoid drinking a lot of liquid with meals.  Cook foods using methods other than frying. Bake, grill, or broil food instead.  Avoid or limit: ? Chocolate. ? Peppermint or spearmint. ? Alcohol. ? Pepper. ? Black and decaffeinated coffee. ? Black and decaffeinated tea. ? Bubbly (carbonated) soft drinks. ? Caffeinated energy drinks and soft drinks.  Limit high-fat foods such as: ? Fatty meat or fried foods. ? Whole milk, cream, butter, or ice cream. ? Nuts and nut butters. ? Pastries, donuts, and sweets made with butter or shortening.  Avoid foods that cause symptoms. These foods may be different for everyone. Common foods that cause symptoms include: ? Tomatoes. ? Oranges, lemons, and limes. ? Peppers. ? Spicy food. ? Onions and garlic. ? Vinegar. Lifestyle  Maintain a healthy weight. Ask your doctor what weight is healthy for you. If you need to lose weight, work with your  doctor to do so safely.  Exercise for at least 30 minutes for 5 or more days each week, or as told by your doctor.  Wear loose-fitting clothes.  Do not smoke. If you need help quitting, ask your doctor.  Sleep with the head of your bed higher than your feet. Use a wedge under the mattress or blocks under the bed frame to raise the head of the bed. Summary  When you have gastroesophageal reflux disease (GERD), food and lifestyle choices are very important in easing your symptoms.  Eat small meals often instead of 3 large meals a day. Eat your meals slowly, and in a place where you are relaxed.  Limit high-fat foods such as fatty meat or fried foods.  Avoid bending over or lying down until 2-3 hours after eating.  Avoid peppermint and spearmint, caffeine, alcohol, and chocolate. This information is not intended to replace advice given to you by your health care provider. Make sure you discuss any questions you have with your health care provider. Document Released: 04/16/2012 Document Revised: 02/06/2019 Document Reviewed: 11/21/2016 Elsevier Patient Education  2020 [employer].

## 2019-06-10 LAB — COMPLETE METABOLIC PANEL WITH GFR
AG Ratio: 0.8 (calc) — ABNORMAL LOW (ref 1.0–2.5)
ALT: 10 U/L (ref 6–29)
AST: 16 U/L (ref 10–35)
Albumin: 3.8 g/dL (ref 3.6–5.1)
Alkaline phosphatase (APISO): 61 U/L (ref 37–153)
BUN/Creatinine Ratio: 15 (calc) (ref 6–22)
BUN: 20 mg/dL (ref 7–25)
CO2: 28 mmol/L (ref 20–32)
Calcium: 9.3 mg/dL (ref 8.6–10.4)
Chloride: 104 mmol/L (ref 98–110)
Creat: 1.34 mg/dL — ABNORMAL HIGH (ref 0.60–0.88)
GFR, Est African American: 42 mL/min/{1.73_m2} — ABNORMAL LOW (ref >=60)
GFR, Est Non African American: 36 mL/min/{1.73_m2} — ABNORMAL LOW (ref >=60)
Globulin: 5 g/dL (calc) — ABNORMAL HIGH (ref 1.9–3.7)
Glucose, Bld: 126 mg/dL (ref 65–139)
Potassium: 3.8 mmol/L (ref 3.5–5.3)
Sodium: 137 mmol/L (ref 135–146)
Total Bilirubin: 0.4 mg/dL (ref 0.2–1.2)
Total Protein: 8.8 g/dL — ABNORMAL HIGH (ref 6.1–8.1)

## 2019-06-10 LAB — CBC WITH DIFFERENTIAL/PLATELET
Absolute Monocytes: 479 cells/uL (ref 200–950)
Basophils Absolute: 10 cells/uL (ref 0–200)
Basophils Relative: 0.2 %
Eosinophils Absolute: 0 cells/uL — ABNORMAL LOW (ref 15–500)
Eosinophils Relative: 0 %
HCT: 32.5 % — ABNORMAL LOW (ref 35.0–45.0)
Hemoglobin: 10.2 g/dL — ABNORMAL LOW (ref 11.7–15.5)
Lymphs Abs: 1158 cells/uL (ref 850–3900)
MCH: 26.8 pg — ABNORMAL LOW (ref 27.0–33.0)
MCHC: 31.4 g/dL — ABNORMAL LOW (ref 32.0–36.0)
MCV: 85.3 fL (ref 80.0–100.0)
MPV: 10 fL (ref 7.5–12.5)
Monocytes Relative: 9.4 %
Neutro Abs: 3453 cells/uL (ref 1500–7800)
Neutrophils Relative %: 67.7 %
Platelets: 229 10*3/uL (ref 140–400)
RBC: 3.81 10*6/uL (ref 3.80–5.10)
RDW: 14.5 % (ref 11.0–15.0)
Total Lymphocyte: 22.7 %
WBC: 5.1 10*3/uL (ref 3.8–10.8)

## 2019-06-16 ENCOUNTER — Other Ambulatory Visit: Payer: Self-pay | Admitting: Nurse Practitioner

## 2019-06-16 DIAGNOSIS — J4489 Other specified chronic obstructive pulmonary disease: Secondary | ICD-10-CM

## 2019-06-16 DIAGNOSIS — J449 Chronic obstructive pulmonary disease, unspecified: Secondary | ICD-10-CM

## 2019-06-24 ENCOUNTER — Encounter: Payer: Self-pay | Admitting: Nurse Practitioner

## 2019-06-24 ENCOUNTER — Other Ambulatory Visit: Payer: Self-pay

## 2019-06-24 ENCOUNTER — Ambulatory Visit (INDEPENDENT_AMBULATORY_CARE_PROVIDER_SITE_OTHER): Payer: Medicare Other | Admitting: Nurse Practitioner

## 2019-06-24 VITALS — BP 132/78 | HR 71 | Temp 98.5°F | Ht <= 58 in | Wt 142.2 lb

## 2019-06-24 DIAGNOSIS — M2550 Pain in unspecified joint: Secondary | ICD-10-CM | POA: Diagnosis not present

## 2019-06-24 DIAGNOSIS — I10 Essential (primary) hypertension: Secondary | ICD-10-CM | POA: Diagnosis not present

## 2019-06-24 DIAGNOSIS — G8929 Other chronic pain: Secondary | ICD-10-CM | POA: Diagnosis not present

## 2019-06-24 DIAGNOSIS — Z23 Encounter for immunization: Secondary | ICD-10-CM

## 2019-06-24 NOTE — Progress Notes (Signed)
Careteam: Patient Care Team: Lauree Chandler, NP as PCP - General (Geriatric Medicine) [name], MD (Pulmonary Disease) Jacolyn Reedy, MD as Consulting Physician (Cardiology)  Advanced Directive information    Allergies  Allergen Reactions  . Aspirin Other (See Comments)    REACTION: upset stomach  . Codeine Other (See Comments)    REACTION: "MAKES MY BODY GO CRAZY; CRAMPS"  . Latex Nausea And Vomiting    Chief Complaint  Patient presents with  . Follow-up    2 week blood pressure follow-up     HPI: Patient is a 85 y.o. female seen in the office today to follow up blood pressure. At last visit she had not taken her medication. Nephew reported it would be hard to monitor at home therefore opted to bring her back in to re-evaluate on a day they knew she would be getting medication. Also discussed dietary modifications. She is taking coreg 6.25 mg by mouth twice daily, hydralazine 25 mg twice daily, demadex with potassium  Pain has been well controlled on tylenol at this time.   Review of Systems:  Review of Systems  Unable to perform ROS: Dementia    Past Medical History:  Diagnosis Date  . Anemia, unspecified   . Anxiety   . Arthritis   . Asthma   . Delusions ([place])   . Depression   . Edema 2005   "since put the glass in them"   . GERD (gastroesophageal reflux disease)   . Lumbago   . Lymphedema   . Obstructive chronic bronchitis without exacerbation ([place])   . Other abnormal glucose   . 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 Systolic heart failure (HCC)    EF 35 to 40% per echo January 2015  . Unspecified essential hypertension   . Unspecified nonpsychotic mental disorder   . Unspecified venous (peripheral) insufficiency   . Unspecified venous (peripheral) insufficiency   . Unspecified vitamin D deficiency    Past Surgical History:  Procedure Laterality Date  . APPENDECTOMY    . CERVICAL SPINE SURGERY     x 2  . CHOLECYSTECTOMY    .  ESOPHAGOGASTRODUODENOSCOPY N/A 04/05/2015   Procedure: ESOPHAGOGASTRODUODENOSCOPY (EGD);  Surgeon: [name], MD;  Location: [facility];  Service: Endoscopy;  Laterality: N/A;  . FLEXIBLE SIGMOIDOSCOPY N/A 04/05/2015   Procedure: FLEXIBLE SIGMOIDOSCOPY;  Surgeon: [name], MD;  Location: [facility];  Service: Endoscopy;  Laterality: N/A;  . HAND SURGERY    . LUMBAR DISC SURGERY    . PARTIAL HYSTERECTOMY    . TONSILLECTOMY     Social History:   reports that she has never smoked. She has never used smokeless tobacco. She reports that she does not drink alcohol or use drugs.  Family History  Problem Relation Age of Onset  . Diabetes Mother   . Arthritis Father   . Diabetes Paternal Grandfather        entire family on both sides  . Heart disease Maternal Aunt        entire family of both sides  . Cancer Paternal Aunt        type unknown  . Diabetes Sister   . Breast cancer Cousin     Medications: Patient's Medications  New Prescriptions   No medications on file  Previous Medications   ACETAMINOPHEN (TYLENOL 8 HOUR) 650 MG CR TABLET    Take 1 tablet (650 mg total) by mouth 3 (three) times daily with meals.  ALBUTEROL (PROAIR HFA) 108 (90 BASE) MCG/ACT INHALER    INHALE 2 PUFFS EVERY 4 HOURS AS NEEDED FOR WHEEZING   ALPRAZOLAM (XANAX) 0.5 MG TABLET    Take 1/2 to 1 tablet by mouth three times daily as needed for nerves   ASCORBIC ACID (VITAMIN C) 1000 MG TABLET    2 by mouth twice daily before lunch and supper    CALCIUM-VITAMIN D (OSCAL WITH D) 500-200 MG-UNIT TABLET    Take 1 tablet by mouth daily with breakfast.   CARVEDILOL (COREG) 6.25 MG TABLET    Take 1 tablet twice daily in lunch and supper.   CLOTRIMAZOLE-BETAMETHASONE (LOTRISONE) CREAM    Apply 1 application topically 2 (two) times daily. To your arm and leg rash   DENOSUMAB (PROLIA) 60 MG/ML SOSY INJECTION    Inject 60 mg into the skin every 6 (six) months.   FERROUS SULFATE DRIED 200 (65 FE) MG TABS    Take  1 tablet by mouth 2 (two) times daily before lunch and supper.   FLUTICASONE-SALMETEROL (ADVAIR HFA) 115-21 MCG/ACT INHALER    USE 2 INHALATIONS TWICE A DAY   GABAPENTIN (NEURONTIN) 100 MG CAPSULE    TAKE 2 CAPSULES BY MOUTH IN THE MORNING AND 2 CAPSULES AT BEDTIME   HYDRALAZINE (APRESOLINE) 25 MG TABLET    TAKE 1 TABLET(25 MG TOTAL) BY MOUTH TWICE DAILY AT 8 AM AND 10 PM   OLANZAPINE (ZYPREXA) 5 MG TABLET    Take 1 tablet (5 mg total) by mouth at bedtime.   POTASSIUM CHLORIDE SA (K-DUR) 20 MEQ TABLET    Take 2 tablets (40 mEq total) by mouth 2 (two) times daily before lunch and supper.   TORSEMIDE (DEMADEX) 20 MG TABLET    Take 2 tablets (40 mg total) by mouth 2 (two) times daily. Take 2 tabs at 8 am and 10 pm   VITAMIN D, ERGOCALCIFEROL, (DRISDOL) 1.25 MG (50000 UT) CAPS CAPSULE    Take 1 capsule (50,000 Units total) by mouth every Monday.  Modified Medications   No medications on file  Discontinued Medications   No medications on file    Physical Exam:  Vitals:   06/24/19 1529  BP: 132/78  Pulse: 71  Temp: 98.5 F (36.9 C)  TempSrc: Oral  SpO2: 98%  Weight: 142 lb 3.2 oz (64.5 kg)  Height: 4\' 10"  (1.473 m)   Body mass index is 29.72 kg/m. Wt Readings from Last 3 Encounters:  06/24/19 142 lb 3.2 oz (64.5 kg)  06/09/19 142 lb (64.4 kg)  05/05/19 140 lb (63.5 kg)    Physical Exam Constitutional:      Appearance: Normal appearance. She is well-developed.  Neck:     Musculoskeletal: Neck supple.     Thyroid: No thyromegaly.     Vascular: No carotid bruit.     Trachea: No tracheal deviation.  Cardiovascular:     Rate and Rhythm: Normal rate and regular rhythm.     Heart sounds: Murmur (1/6 SEM) present. No friction rub. No gallop.      Comments: L>RLE lymphedema with chronic venous stasis changes; no weeping; no calf TTP b/l Pulmonary:     Effort: Pulmonary effort is normal. No respiratory distress.     Breath sounds: Normal breath sounds. No stridor. No wheezing or  rales.  Abdominal:     General: Bowel sounds are normal. There is no distension.     Palpations: Abdomen is soft. Abdomen is not rigid. There is no hepatomegaly or mass.  Tenderness: There is no abdominal tenderness. There is no guarding or rebound.     Hernia: No hernia is present.  Musculoskeletal:     Right lower leg: Edema present.     Left lower leg: Edema present.  Lymphadenopathy:     Cervical: No cervical adenopathy.  Skin:    General: Skin is warm and dry.  Neurological:     Mental Status: She is alert.     Deep Tendon Reflexes: Reflexes are normal and symmetric.  Psychiatric:        Behavior: Behavior normal.        Cognition and Memory: Cognition is impaired. Memory is impaired. She exhibits impaired recent memory.     Labs reviewed: Basic Metabolic Panel: Recent Labs    11/04/18 1603 11/28/18 1544 06/09/19 1605  NA 139 140 137  K 3.8 3.9 3.8  CL 101 106 104  CO2 30 25 28   GLUCOSE 77 81 126  BUN 55* 25 20  CREATININE 1.58* 1.30* 1.34*  CALCIUM 9.2 9.6 9.3   Liver Function Tests: Recent Labs    11/04/18 1603 06/09/19 1605  AST 17 16  ALT 9 10  BILITOT 0.4 0.4  PROT 8.3* 8.8*   No results for input(s): LIPASE, AMYLASE in the last 8760 hours. No results for input(s): AMMONIA in the last 8760 hours. CBC: Recent Labs    11/04/18 1603 06/09/19 1605  WBC 5.2 5.1  NEUTROABS 3,037 3,453  HGB 10.9* 10.2*  HCT 34.2* 32.5*  MCV 83.8 85.3  PLT 252 229   Lipid Panel: No results for input(s): CHOL, HDL, LDLCALC, TRIG, CHOLHDL, LDLDIRECT in the last 8760 hours. TSH: No results for input(s): TSH in the last 8760 hours. A1C: Lab Results  Component Value Date   HGBA1C 5.5 04/14/2014     Assessment/Plan 1. Need for influenza vaccination - Flu Vaccine QUAD High Dose(Fluad)  2. Essential hypertension -well controlled at this time- took medications today. Continue current regimen, stressed compliance with medications and diet modifications.  3.  Chronic pain of multiple joints Controlled on tylenol at this time.   Next appt: 4 months for routine followup Leam Madero K. [place], [facility] [phone]

## 2019-07-03 ENCOUNTER — Encounter: Payer: Self-pay | Admitting: Family

## 2019-07-03 ENCOUNTER — Ambulatory Visit (INDEPENDENT_AMBULATORY_CARE_PROVIDER_SITE_OTHER): Payer: Medicare Other | Admitting: Family

## 2019-07-03 ENCOUNTER — Other Ambulatory Visit: Payer: Self-pay

## 2019-07-03 VITALS — BP 140/86 | HR 70 | Temp 98.2°F | Ht <= 58 in | Wt 138.8 lb

## 2019-07-03 DIAGNOSIS — H6123 Impacted cerumen, bilateral: Secondary | ICD-10-CM

## 2019-07-03 DIAGNOSIS — R21 Rash and other nonspecific skin eruption: Secondary | ICD-10-CM | POA: Diagnosis not present

## 2019-07-03 DIAGNOSIS — L299 Pruritus, unspecified: Secondary | ICD-10-CM

## 2019-07-03 LAB — CBC WITH DIFFERENTIAL/PLATELET
Absolute Monocytes: 481 cells/uL (ref 200–950)
Basophils Absolute: 33 cells/uL (ref 0–200)
Basophils Relative: 0.5 %
Eosinophils Absolute: 13 cells/uL — ABNORMAL LOW (ref 15–500)
Eosinophils Relative: 0.2 %
HCT: 32.2 % — ABNORMAL LOW (ref 35.0–45.0)
Hemoglobin: 10 g/dL — ABNORMAL LOW (ref 11.7–15.5)
Lymphs Abs: 1229 cells/uL (ref 850–3900)
MCH: 26.7 pg — ABNORMAL LOW (ref 27.0–33.0)
MCHC: 31.1 g/dL — ABNORMAL LOW (ref 32.0–36.0)
MCV: 85.9 fL (ref 80.0–100.0)
MPV: 9.2 fL (ref 7.5–12.5)
Monocytes Relative: 7.4 %
Neutro Abs: 4745 cells/uL (ref 1500–7800)
Neutrophils Relative %: 73 %
Platelets: 322 10*3/uL (ref 140–400)
RBC: 3.75 10*6/uL — ABNORMAL LOW (ref 3.80–5.10)
RDW: 14 % (ref 11.0–15.0)
Total Lymphocyte: 18.9 %
WBC: 6.5 10*3/uL (ref 3.8–10.8)

## 2019-07-03 MED ORDER — CETIRIZINE HCL 10 MG PO TABS: 10.0000 mg | ORAL_TABLET | Freq: Every day | ORAL | 11 refills | Status: DC

## 2019-07-03 MED ORDER — CALAMINE EX LOTN: 1.0000 "application " | TOPICAL_LOTION | Freq: Two times a day (BID) | CUTANEOUS | 0 refills | Status: DC

## 2019-07-03 MED ORDER — PERMETHRIN 5 % EX CREA: 1.0000 "application " | TOPICAL_CREAM | Freq: Once | CUTANEOUS | 0 refills | Status: DC

## 2019-07-03 MED ORDER — DEBROX 6.5 % OT SOLN: 5.0000 [drp] | Freq: Two times a day (BID) | OTIC | 0 refills | Status: AC

## 2019-07-03 NOTE — Progress Notes (Signed)
Provider: Gerald Honea FNP-C  Lauree Chandler, NP  Patient Care Team: Lauree Chandler, NP as PCP - General (Geriatric Medicine) [name], MD (Pulmonary Disease) Jacolyn Reedy, MD as Consulting Physician (Cardiology)  Extended Emergency Contact Information Primary Emergency Contact: Marlene Bast Address: [address]          [place], [name] Phone: [phone] Relation: Alanson Puls Secondary Emergency Contact: Phelps,Rachel  [place] Phone: [phone] Relation: Niece  Code Status:  Goals of care: Advanced Directive information Advanced Directives 06/09/2019  Does Patient Have a Medical Advance Directive? No  Type of Advance Directive -  Does patient want to make changes to medical advance directive? -  Copy of [place] in Chart? -  Would patient like information on creating a medical advance directive? No - Patient declined     Chief Complaint  Patient presents with   Acute Visit    Rash all over x few days. Patient denies changes in daily living habits such as soaps, laundry detergent, or lotion. Tried OTC cream     HPI:  Pt is a 85 y.o. female seen today for an acute visit for evaluation of Rash all over x few days though states had vomiting one week ago prior to rash. The rash is located on all over the body. She has applied Vaseline without any help.Rash is itchy but she tries not to scratch " I just rub".she has had no fever or chills. No recent illnesses or diagnosis of COVID-19.she states has had no contacts with sick persons.she wears a mask when out of the house.She denies changes in soaps, laundry detergent, or lotion.    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   Systolic heart failure ([place])    EF 35 to 40% per echo January 2015   Unspecified essential hypertension    Unspecified nonpsychotic mental disorder    Unspecified venous (peripheral) insufficiency    Unspecified venous (peripheral) insufficiency    Unspecified vitamin D deficiency    Past Surgical History:  Procedure Laterality Date   APPENDECTOMY     CERVICAL SPINE SURGERY     x 2   CHOLECYSTECTOMY     ESOPHAGOGASTRODUODENOSCOPY N/A 04/05/2015   Procedure: ESOPHAGOGASTRODUODENOSCOPY (EGD);  Surgeon: [name], MD;  Location: [facility];  Service: Endoscopy;  Laterality: N/A;   FLEXIBLE SIGMOIDOSCOPY N/A 04/05/2015   Procedure: FLEXIBLE SIGMOIDOSCOPY;  Surgeon: [name], MD;  Location: [facility];  Service: Endoscopy;  Laterality: N/A;   HAND SURGERY     LUMBAR DISC SURGERY     PARTIAL HYSTERECTOMY     TONSILLECTOMY      Allergies  Allergen Reactions   Aspirin Other (See Comments)    REACTION: upset stomach   Codeine Other (See Comments)    REACTION: "MAKES MY BODY GO CRAZY; CRAMPS"   Latex Nausea And Vomiting    Outpatient Encounter Medications as of 07/03/2019  Medication Sig   acetaminophen (TYLENOL 8 HOUR) 650 MG CR tablet Take 1  tablet (650 mg total) by mouth 3 (three) times daily with meals.   albuterol (PROAIR HFA) 108 (90 Base) MCG/ACT inhaler INHALE 2 PUFFS EVERY 4 HOURS AS NEEDED FOR WHEEZING   ALPRAZolam (XANAX) 0.5 MG tablet Take 1/2 to 1 tablet by mouth three times daily as needed for nerves   Ascorbic Acid (VITAMIN C) 1000 MG tablet 2 by mouth twice daily before lunch and supper    calcium-vitamin D (OSCAL WITH D) 500-200 MG-UNIT tablet Take 1 tablet by mouth daily with breakfast.   carvedilol (COREG) 6.25 MG tablet Take 1 tablet twice daily in lunch and supper.    clotrimazole-betamethasone (LOTRISONE) cream Apply 1 application topically 2 (two) times daily. To your arm and leg rash   denosumab (PROLIA) 60 MG/ML SOSY injection Inject 60 mg into the skin every 6 (six) months.   Ferrous Sulfate Dried 200 (65 Fe) MG TABS Take 1 tablet by mouth 2 (two) times daily before lunch and supper.   fluticasone-salmeterol (ADVAIR HFA) 115-21 MCG/ACT inhaler USE 2 INHALATIONS TWICE A DAY   gabapentin (NEURONTIN) 100 MG capsule TAKE 2 CAPSULES BY MOUTH IN THE MORNING AND 2 CAPSULES AT BEDTIME   hydrALAZINE (APRESOLINE) 25 MG tablet TAKE 1 TABLET(25 MG TOTAL) BY MOUTH TWICE DAILY AT 8 AM AND 10 PM   OLANZapine (ZYPREXA) 5 MG tablet Take 1 tablet (5 mg total) by mouth at bedtime.   potassium chloride SA (K-DUR) 20 MEQ tablet Take 2 tablets (40 mEq total) by mouth 2 (two) times daily before lunch and supper.   torsemide (DEMADEX) 20 MG tablet Take 2 tablets (40 mg total) by mouth 2 (two) times daily. Take 2 tabs at 8 am and 10 pm   Vitamin D, Ergocalciferol, (DRISDOL) 1.25 MG (50000 UT) CAPS capsule Take 1 capsule (50,000 Units total) by mouth every Monday.   No facility-administered encounter medications on file as of 07/03/2019.     Review of Systems  Constitutional: Positive for appetite change. Negative for chills, fatigue and fever.  HENT: Negative for congestion, rhinorrhea, sinus pressure, sinus pain, sneezing and sore throat.   Eyes: Negative for discharge, redness and itching.  Respiratory: Negative for cough, chest tightness, shortness of breath and wheezing.   Cardiovascular: Negative for chest pain and palpitations.       Chronic leg swelling left leg > right leg   Gastrointestinal: Negative for abdominal distention, abdominal pain, constipation, diarrhea and nausea.       Had vomiting one week ago at the onset of rash but none since then.   Genitourinary: Negative for difficulty urinating, dysuria, flank pain, frequency and urgency.  Musculoskeletal:  Positive for gait problem. Negative for arthralgias and back pain.  Skin: Positive for rash. Negative for color change, pallor and wound.  Neurological: Negative for dizziness, light-headedness and headaches.  Psychiatric/Behavioral: Negative for agitation and sleep disturbance. The patient is not nervous/anxious.     Immunization History  Administered Date(s) Administered   Fluad Quad(high Dose 65+) 06/24/2019   Influenza Split 07/31/2011   Influenza Whole 09/30/2009, 07/30/2010   Influenza,inj,Quad PF,6+ Mos 08/13/2015, 07/03/2017, 08/16/2018   Influenza-Unspecified 07/30/2016   Pneumococcal Conjugate-13 11/24/2016   Pneumococcal Polysaccharide-23 08/16/2018   Tdap 04/07/2015   Varicella 08/25/2011   Pertinent  Health Maintenance Due  Topic Date Due   COLONOSCOPY  08/25/2015   INFLUENZA VACCINE  Completed   DEXA SCAN  Completed   PNA vac Low Risk Adult  Completed   Fall Risk  06/09/2019 05/05/2019 03/06/2019 11/04/2018 06/14/2018  Falls in the past year? 0 0 0 0 No  Number falls in past yr: 0 0 0 0 -  Injury with Fall? 0 0 0 0 -  Comment - - - - -  Risk Factor Category  - - - - -  Risk for fall due to : - - - - -  Risk for fall due to: Comment - - - - -  Follow up - - - - -    Vitals:   07/03/19 1402  BP: 140/86  Pulse: 70  Temp: 98.2 F (36.8 C)  TempSrc: Temporal  SpO2: 98%  Weight: 138 lb 12.8 oz (63 kg)  Height: 4\' 10"  (1.473 m)   Body mass index is 29.01 kg/m. Physical Exam Vitals signs reviewed.  Constitutional:      General: She is not in acute distress.    Appearance: She is not ill-appearing.  HENT:     Head: Normocephalic.     Right Ear: There is impacted cerumen.     Left Ear: There is impacted cerumen.     Nose: No congestion or rhinorrhea.     Mouth/Throat:     Mouth: Mucous membranes are moist.     Pharynx: Oropharynx is clear. No oropharyngeal exudate or posterior oropharyngeal erythema.  Eyes:     General: No scleral icterus.        Right eye: No discharge.        Left eye: No discharge.     Conjunctiva/sclera: Conjunctivae normal.     Pupils: Pupils are equal, round, and reactive to light.  Neck:     Musculoskeletal: Normal range of motion. No neck rigidity or muscular tenderness.  Cardiovascular:     Rate and Rhythm: Normal rate and regular rhythm.     Heart sounds: Murmur present. No friction rub. No gallop.      Comments: Chronic lymphedema calf muscle Negative TTP.  Pulmonary:     Effort: Pulmonary effort is normal. No respiratory distress.     Breath sounds: Normal breath sounds. No wheezing, rhonchi or rales.  Chest:     Chest wall: No tenderness.  Abdominal:     General: Bowel sounds are normal. There is no distension.     Palpations: Abdomen is soft. There is no mass.     Tenderness: There is no abdominal tenderness. There is no right CVA tenderness, left CVA tenderness, guarding or rebound.  Musculoskeletal:        General: No tenderness.     Comments: Unsteady gait walks with walker.bilateral lower extremities chronic lymphedema Left leg > right leg with chronic venous skin changes noted.   Lymphadenopathy:     Cervical: No cervical adenopathy.  Skin:    General: Skin is warm and dry.     Coloration: Skin is not pale.     Findings: Rash present. No bruising. Rash is crusting and papular.     Comments: Generalized papular rash different stages with crusting/scab areas on bilateral arms/hands,back,abdomen and thighs.  Neurological:     Mental Status: She is alert.     Cranial Nerves: No cranial nerve deficit.     Sensory: No sensory deficit.     Motor: No weakness.     Coordination: Coordination normal.     Gait: Gait abnormal.     Comments: Alert and oriented to person and place.   Psychiatric:        Mood and Affect: Mood normal.        Behavior:  Behavior normal.        Thought Content: Thought content normal.        Judgment: Judgment normal.     Labs reviewed: Recent Labs     11/04/18 1603 11/28/18 1544 06/09/19 1605  NA 139 140 137  K 3.8 3.9 3.8  CL 101 106 104  CO2 30 25 28   GLUCOSE 77 81 126  BUN 55* 25 20  CREATININE 1.58* 1.30* 1.34*  CALCIUM 9.2 9.6 9.3   Recent Labs    11/04/18 1603 06/09/19 1605  AST 17 16  ALT 9 10  BILITOT 0.4 0.4  PROT 8.3* 8.8*   Recent Labs    11/04/18 1603 06/09/19 1605  WBC 5.2 5.1  NEUTROABS 3,037 3,453  HGB 10.9* 10.2*  HCT 34.2* 32.5*  MCV 83.8 85.3  PLT 252 229   Lab Results  Component Value Date   TSH 1.11 11/24/2016   Lab Results  Component Value Date   HGBA1C 5.5 04/14/2014   Lab Results  Component Value Date   CHOL 207 (H) 11/24/2016   HDL 81 11/24/2016   LDLCALC 111 (H) 11/24/2016   TRIG 73 11/24/2016   CHOLHDL 2.6 11/24/2016    Significant Diagnostic Results in last 30 days:  No results found.  Assessment/Plan 1. Rash Afebrile.papular itchy rash with different stages of crusting and scab areas. - permethrin (ELIMITE) 5 % cream; Apply 1 application topically once for 1 dose. Repeat application in one week  Dispense: 60 g; Refill: 0 - calamine lotion; Apply 1 application topically 2 (two) times daily for itching  Dispense: 120 mL; Refill: 0 - CBC with Differential/Platelet  2. Itching - calamine lotion; Apply 1 application topically 2 (two) times daily.  Dispense: 120 mL; Refill: 0 - cetirizine (ZYRTEC) 10 MG tablet; Take 1 tablet (10 mg total) by mouth daily.  Dispense: 30 tablet; Refill: 11 - CBC with Differential/Platelet  3. Bilateral impacted cerumen Debrox 6.5% otic solution instil 5 drops into each ear twice daily x 4 days prior to your visit will lavage with warm water.   Family/ staff Communication: Reviewed plan of care with patient and nephew.  Labs/tests ordered:  - CBC with Differential/Platelet Time spent with patient 25 minutes >50% time spent counseling; reviewing medical record; tests; labs; and developing future plan of care  Sandrea Hughs, NP

## 2019-07-03 NOTE — Patient Instructions (Signed)
1.use dial antibacterial soap   2. Apply permethrin 5 % topical cream from neck to sole of the feet leave cream on for at least 8 hour then wash off with warm water.Repeat application of cream in one week.  3. Wash bed linen with warm water and soap.  4. Apply calamine lotion twice daily for itching.

## 2019-07-24 ENCOUNTER — Encounter: Payer: Self-pay | Admitting: Family

## 2019-07-24 ENCOUNTER — Ambulatory Visit (INDEPENDENT_AMBULATORY_CARE_PROVIDER_SITE_OTHER): Payer: Medicare Other | Admitting: Family

## 2019-07-24 ENCOUNTER — Other Ambulatory Visit: Payer: Self-pay

## 2019-07-24 VITALS — BP 124/76 | HR 78 | Temp 98.2°F | Ht <= 58 in | Wt 142.6 lb

## 2019-07-24 DIAGNOSIS — R21 Rash and other nonspecific skin eruption: Secondary | ICD-10-CM | POA: Diagnosis not present

## 2019-07-24 DIAGNOSIS — L602 Onychogryphosis: Secondary | ICD-10-CM

## 2019-07-24 DIAGNOSIS — L853 Xerosis cutis: Secondary | ICD-10-CM

## 2019-07-24 DIAGNOSIS — L299 Pruritus, unspecified: Secondary | ICD-10-CM

## 2019-07-24 DIAGNOSIS — I890 Lymphedema, not elsewhere classified: Secondary | ICD-10-CM

## 2019-07-24 MED ORDER — CALAMINE EX LOTN: 1.0000 "application " | TOPICAL_LOTION | Freq: Two times a day (BID) | CUTANEOUS | 0 refills | Status: DC

## 2019-07-24 MED ORDER — AQUAPHOR EX OINT: TOPICAL_OINTMENT | CUTANEOUS | 0 refills | Status: DC | PRN

## 2019-07-24 MED ORDER — PERMETHRIN 5 % EX CREA: 1.0000 "application " | TOPICAL_CREAM | Freq: Once | CUTANEOUS | 0 refills | Status: AC

## 2019-07-24 NOTE — Progress Notes (Signed)
Provider: Rebecka Oelkers FNP-C  Lauree Chandler, NP  Patient Care Team: Lauree Chandler, NP as PCP - General (Geriatric Medicine) [name], MD (Pulmonary Disease) Jacolyn Reedy, MD as Consulting Physician (Cardiology)  Extended Emergency Contact Information Primary Emergency Contact: Marlene Bast Address: [address]          [place], [place] Phone: [phone] Relation: Alanson Puls Secondary Emergency Contact: Phelps,Rachel  [place] Phone: [phone] Relation: Niece  Code Status:  Goals of care: Advanced Directive information Advanced Directives 06/09/2019  Does Patient Have a Medical Advance Directive? No  Type of Advance Directive -  Does patient want to make changes to medical advance directive? -  Copy of [place] in Chart? -  Would patient like information on creating a medical advance directive? No - Patient declined     Chief Complaint  Patient presents with   Follow-up    3 week follow up patient c/o of back pain and states its been hurting for over a week patient states she has loss appetite for now over 3 weeks, patient also states she has broke out in measel like bumps all over body 2 weeks she is unsure    Medication Management    premethrin 5% cream patient is requesting new script states it helps     HPI:  Pt is a 85 y.o. female seen today at [facility] office for an acute visit for follow 3 week follow up rash.she states applied the previous prescribed lotion and medication with much improvement but not completely resolved.she has washed her clothes with warm water.Itching has improved.she would like a refill of premethrin cream.she wonders whether this is measle bumps.  Also complains of lower back pain hurting for over a week.she denies any radiation down to her legs.No new onset bowel control or worsening bladder issues.   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   Systolic heart failure ([place])    EF 35 to 40% per echo January 2015   Unspecified essential hypertension    Unspecified nonpsychotic mental disorder    Unspecified venous (peripheral) insufficiency    Unspecified venous (peripheral) insufficiency    Unspecified vitamin D deficiency    Past Surgical History:  Procedure Laterality Date   APPENDECTOMY     CERVICAL SPINE SURGERY     x 2   CHOLECYSTECTOMY     ESOPHAGOGASTRODUODENOSCOPY N/A 04/05/2015   Procedure: ESOPHAGOGASTRODUODENOSCOPY (EGD);  Surgeon: [name], MD;  Location: [facility];  Service: Endoscopy;  Laterality: N/A;   FLEXIBLE SIGMOIDOSCOPY N/A 04/05/2015   Procedure: FLEXIBLE SIGMOIDOSCOPY;  Surgeon: [name], MD;  Location: [facility];  Service: Endoscopy;  Laterality: N/A;   HAND SURGERY     LUMBAR DISC SURGERY     PARTIAL HYSTERECTOMY     TONSILLECTOMY      Allergies  Allergen Reactions   Aspirin Other (See Comments)    REACTION: upset stomach   Codeine Other (See Comments)    REACTION: "MAKES MY BODY GO  CRAZY; CRAMPS"   Latex Nausea And Vomiting    Outpatient Encounter Medications as of 07/24/2019  Medication Sig   acetaminophen (TYLENOL 8 HOUR) 650 MG CR tablet Take 1 tablet (650 mg total) by mouth 3 (three) times daily with meals.   albuterol (PROAIR HFA) 108 (90 Base) MCG/ACT inhaler INHALE 2 PUFFS EVERY 4 HOURS AS NEEDED FOR WHEEZING   ALPRAZolam (XANAX) 0.5 MG tablet Take 1/2 to 1 tablet by mouth three times daily as needed for nerves   Ascorbic Acid (VITAMIN C) 1000 MG tablet 2 by mouth twice daily before lunch and supper     calamine lotion Apply 1 application topically 2 (two) times daily.   calcium-vitamin D (OSCAL WITH D) 500-200 MG-UNIT tablet Take 1 tablet by mouth daily with breakfast.   carvedilol (COREG) 6.25 MG tablet Take 1 tablet twice daily in lunch and supper.   cetirizine (ZYRTEC) 10 MG tablet Take 1 tablet (10 mg total) by mouth daily.   clotrimazole-betamethasone (LOTRISONE) cream Apply 1 application topically 2 (two) times daily. To your arm and leg rash   denosumab (PROLIA) 60 MG/ML SOSY injection Inject 60 mg into the skin every 6 (six) months.   Ferrous Sulfate Dried 200 (65 Fe) MG TABS Take 1 tablet by mouth 2 (two) times daily before lunch and supper.   fluticasone-salmeterol (ADVAIR HFA) 115-21 MCG/ACT inhaler USE 2 INHALATIONS TWICE A DAY   gabapentin (NEURONTIN) 100 MG capsule TAKE 2 CAPSULES BY MOUTH IN THE MORNING AND 2 CAPSULES AT BEDTIME   hydrALAZINE (APRESOLINE) 25 MG tablet TAKE 1 TABLET(25 MG TOTAL) BY MOUTH TWICE DAILY AT 8 AM AND 10 PM   OLANZapine (ZYPREXA) 5 MG tablet Take 1 tablet (5 mg total) by mouth at bedtime.   potassium chloride SA (K-DUR) 20 MEQ tablet Take 2 tablets (40 mEq total) by mouth 2 (two) times daily before lunch and supper.   torsemide (DEMADEX) 20 MG tablet Take 2 tablets (40 mg total) by mouth 2 (two) times daily. Take 2 tabs at 8 am and 10 pm   Vitamin D, Ergocalciferol, (DRISDOL) 1.25 MG (50000 UT) CAPS capsule Take 1 capsule (50,000 Units total) by mouth every Monday.   [DISCONTINUED] permethrin (ELIMITE) 5 % cream Apply 1 application topically once for 1 dose. Repeat application in one week   No facility-administered encounter medications on file as of 07/24/2019.     Review of Systems  Constitutional: Negative for chills, fatigue and fever.  HENT: Negative for congestion, rhinorrhea, sinus pressure, sinus pain and sneezing.   Eyes: Negative for discharge, redness and itching.  Respiratory: Negative for cough, chest tightness, shortness of  breath and wheezing.   Cardiovascular: Positive for leg swelling. Negative for chest pain and palpitations.       Chronic lympedema  Gastrointestinal: Negative for abdominal distention, abdominal pain, constipation, diarrhea, nausea and vomiting.  Genitourinary: Negative for dysuria, flank pain, frequency and urgency.  Musculoskeletal: Positive for arthralgias, back pain and gait problem.  Skin: Positive for rash. Negative for color change, pallor and wound.  Neurological: Negative for dizziness, weakness, light-headedness and headaches.  Psychiatric/Behavioral: Negative for agitation and sleep disturbance. The patient is not nervous/anxious.     Immunization History  Administered Date(s) Administered   Fluad Quad(high Dose 65+) 06/24/2019   Influenza Split 07/31/2011   Influenza Whole 09/30/2009, 07/30/2010   Influenza,inj,Quad PF,6+ Mos 08/13/2015, 07/03/2017, 08/16/2018   Influenza-Unspecified 07/30/2016   Pneumococcal Conjugate-13 11/24/2016   Pneumococcal Polysaccharide-23 08/16/2018   Tdap 04/07/2015  Varicella 08/25/2011   Pertinent  Health Maintenance Due  Topic Date Due   COLONOSCOPY  08/25/2015   INFLUENZA VACCINE  Completed   DEXA SCAN  Completed   PNA vac Low Risk Adult  Completed   Fall Risk  07/24/2019 06/09/2019 05/05/2019 03/06/2019 11/04/2018  Falls in the past year? 0 0 0 0 0  Number falls in past yr: 0 0 0 0 0  Injury with Fall? 0 0 0 0 0  Comment - - - - -  Risk Factor Category  - - - - -  Risk for fall due to : - - - - -  Risk for fall due to: Comment - - - - -  Follow up - - - - -   Functional Status Survey:    Vitals:   07/24/19 1546  BP: 124/76  Pulse: 78  Temp: 98.2 F (36.8 C)  TempSrc: Temporal  SpO2: 99%  Weight: 142 lb 9.6 oz (64.7 kg)  Height: 4\' 10"  (1.473 m)   Body mass index is 29.8 kg/m. Physical Exam Vitals signs reviewed.  Constitutional:      General: She is not in acute distress.    Appearance: She is overweight.  She is not ill-appearing.  HENT:     Head: Normocephalic.     Mouth/Throat:     Mouth: Mucous membranes are moist.     Pharynx: Oropharynx is clear. No oropharyngeal exudate or posterior oropharyngeal erythema.  Eyes:     General: No scleral icterus.       Right eye: No discharge.        Left eye: No discharge.     Conjunctiva/sclera: Conjunctivae normal.     Pupils: Pupils are equal, round, and reactive to light.  Neck:     Musculoskeletal: Normal range of motion. No neck rigidity or muscular tenderness.  Cardiovascular:     Rate and Rhythm: Normal rate and regular rhythm.     Heart sounds: Murmur present. No friction rub. No gallop.   Pulmonary:     Effort: Pulmonary effort is normal. No respiratory distress.     Breath sounds: Normal breath sounds. No wheezing, rhonchi or rales.  Chest:     Chest wall: No tenderness.  Abdominal:     General: Bowel sounds are normal. There is no distension.     Palpations: Abdomen is soft. There is no mass.     Tenderness: There is no abdominal tenderness. There is no right CVA tenderness, left CVA tenderness, guarding or rebound.  Musculoskeletal:        General: No tenderness.     Comments: Unsteady gait walks with a cane.chronic lymphedema to bilateral lower extremities with Left > right leg.   Lymphadenopathy:     Cervical: No cervical adenopathy.  Skin:    General: Skin is warm and dry.     Coloration: Skin is not pale.     Findings: No bruising or erythema.     Comments: 1. Previous generalized on trunk and extremities has improved.rash resolved on the back and abdomen.small scab areas with marked scratches noted on upper arms and legs.  2. Bilateral lower extremities skin dry and scaly.  3. Bilateral overgrown toenails.   Neurological:     Mental Status: She is alert. Mental status is at baseline.     Motor: No weakness.     Coordination: Coordination normal.     Gait: Gait abnormal.  Psychiatric:        Mood and  Affect: Mood  normal.        Behavior: Behavior normal.        Thought Content: Thought content normal.        Judgment: Judgment normal.     Labs reviewed: Recent Labs    11/04/18 1603 11/28/18 1544 06/09/19 1605  NA 139 140 137  K 3.8 3.9 3.8  CL 101 106 104  CO2 30 25 28   GLUCOSE 77 81 126  BUN 55* 25 20  CREATININE 1.58* 1.30* 1.34*  CALCIUM 9.2 9.6 9.3   Recent Labs    11/04/18 1603 06/09/19 1605  AST 17 16  ALT 9 10  BILITOT 0.4 0.4  PROT 8.3* 8.8*   Recent Labs    11/04/18 1603 06/09/19 1605 07/03/19 1513  WBC 5.2 5.1 6.5  NEUTROABS 3,037 3,453 4,745  HGB 10.9* 10.2* 10.0*  HCT 34.2* 32.5* 32.2*  MCV 83.8 85.3 85.9  PLT 252 229 322   Lab Results  Component Value Date   TSH 1.11 11/24/2016   Lab Results  Component Value Date   HGBA1C 5.5 04/14/2014   Lab Results  Component Value Date   CHOL 207 (H) 11/24/2016   HDL 81 11/24/2016   LDLCALC 111 (H) 11/24/2016   TRIG 73 11/24/2016   CHOLHDL 2.6 11/24/2016    Significant Diagnostic Results in last 30 days:  No results found.  Assessment/Plan 1. Rash Afebrile.Has improved with previous treatment with Permethrin. - permethrin (ELIMITE) 5 % cream; Apply 1 application topically once for 1 dose. Repeat application in one week  Dispense: 60 g; Refill: 0 - calamine lotion; Apply 1 application topically 2 (two) times daily.  Dispense: 120 mL; Refill: 0  2. Itching - calamine lotion; Apply 1 application topically 2 (two) times daily.  Dispense: 120 mL; Refill: 0  3. Overgrown toenails Overgrown bilateral - Ambulatory referral to Podiatry  4. Lymphedema of both lower extremities Continue on diuretic.she will benefit from wrap. - AMB referral to wound care center  5. Dry skin Apply Aquaphor ointment to skin for dry skin as needed.   Family/ staff Communication: Reviewed plan of care with patient and Nephew.   Labs/tests ordered: None   Davian Hanshaw C Brandyce Dimario, NP

## 2019-08-08 ENCOUNTER — Other Ambulatory Visit: Payer: Self-pay

## 2019-08-08 ENCOUNTER — Ambulatory Visit (INDEPENDENT_AMBULATORY_CARE_PROVIDER_SITE_OTHER): Payer: Medicare Other | Admitting: Podiatry

## 2019-08-08 DIAGNOSIS — L853 Xerosis cutis: Secondary | ICD-10-CM | POA: Diagnosis not present

## 2019-08-08 DIAGNOSIS — I739 Peripheral vascular disease, unspecified: Secondary | ICD-10-CM

## 2019-08-08 DIAGNOSIS — B351 Tinea unguium: Secondary | ICD-10-CM

## 2019-08-08 DIAGNOSIS — R7309 Other abnormal glucose: Secondary | ICD-10-CM | POA: Diagnosis not present

## 2019-08-08 DIAGNOSIS — M79676 Pain in unspecified toe(s): Secondary | ICD-10-CM | POA: Diagnosis not present

## 2019-08-11 ENCOUNTER — Encounter: Payer: Self-pay | Admitting: Podiatry

## 2019-08-11 ENCOUNTER — Encounter (HOSPITAL_BASED_OUTPATIENT_CLINIC_OR_DEPARTMENT_OTHER): Payer: Medicare Other | Admitting: Internal Medicine

## 2019-08-11 NOTE — Progress Notes (Signed)
  Subjective:  Patient ID: [name], female    DOB: September 26, 1932,  MRN: [id number]  No chief complaint on file.  85 y.o. female returns for the above complaint.  Patient states that she has bilateral toenail fungus complaints with painful nature.  She also is complaining of dry skin to the bilateral lower extremity.  With some swelling.  Objective:  There were no vitals filed for this visit. Podiatric Exam: Vascular: Diminished pedal pulses to the bilateral lower extremity.  There is a dark discoloration noted to the bilateral lower extremity consistent with pigmentary changes. Sensorium: Normal Semmes Weinstein monofilament test. Normal tactile sensation bilaterally. Nail Exam: Pt has thick disfigured discolored nails with subungual debris noted bilateral entire nail hallux through fifth toenails Ulcer Exam: There is no evidence of ulcer or pre-ulcerative changes or infection. Orthopedic Exam: Muscle tone and strength are WNL. No limitations in general ROM. No crepitus or effusions noted. HAV  B/L.  Hammer toes 2-5  B/L. Skin: No Porokeratosis. No infection or ulcers  Assessment & Plan:  Patient was evaluated and treated and all questions answered.  Onychomycosis with pain  -Nails palliatively debrided as below. -Educated on self-care  Xerosis noted bilaterally plantar foot --Educated the patient of the etiology of xerosis and told her to continue applying over-the-counter lotion.  Procedure: Nail Debridement Rationale: pain  Type of Debridement: manual, sharp debridement. Instrumentation: Nail nipper, rotary burr. Number of Nails: 10  Procedures and Treatment: Consent by patient was obtained for treatment procedures. The patient understood the discussion of treatment and procedures well. All questions were answered thoroughly reviewed. Debridement of mycotic and hypertrophic toenails, 1 through 5 bilateral and clearing of subungual debris. No ulceration, no infection noted.   Return Visit-Office Procedure: Patient instructed to return to the office for a follow up visit 3 months for continued evaluation and treatment.  Boneta Lucks, DPM    Return in about 3 months (around 11/08/2019).

## 2019-08-20 ENCOUNTER — Other Ambulatory Visit: Payer: Self-pay

## 2019-08-20 ENCOUNTER — Encounter (HOSPITAL_BASED_OUTPATIENT_CLINIC_OR_DEPARTMENT_OTHER): Payer: Medicare Other | Attending: Internal Medicine | Admitting: Physician Assistant

## 2019-08-20 DIAGNOSIS — I5042 Chronic combined systolic (congestive) and diastolic (congestive) heart failure: Secondary | ICD-10-CM | POA: Insufficient documentation

## 2019-08-20 DIAGNOSIS — I890 Lymphedema, not elsewhere classified: Secondary | ICD-10-CM | POA: Insufficient documentation

## 2019-08-20 DIAGNOSIS — I110 Hypertensive heart disease with heart failure: Secondary | ICD-10-CM | POA: Diagnosis not present

## 2019-08-20 DIAGNOSIS — L97812 Non-pressure chronic ulcer of other part of right lower leg with fat layer exposed: Secondary | ICD-10-CM | POA: Insufficient documentation

## 2019-08-20 DIAGNOSIS — J449 Chronic obstructive pulmonary disease, unspecified: Secondary | ICD-10-CM | POA: Diagnosis not present

## 2019-08-28 ENCOUNTER — Ambulatory Visit (HOSPITAL_BASED_OUTPATIENT_CLINIC_OR_DEPARTMENT_OTHER): Payer: Medicare Other | Admitting: Internal Medicine

## 2019-09-03 ENCOUNTER — Other Ambulatory Visit: Payer: Self-pay

## 2019-09-03 ENCOUNTER — Encounter (HOSPITAL_BASED_OUTPATIENT_CLINIC_OR_DEPARTMENT_OTHER): Payer: Medicare Other | Attending: Physician Assistant | Admitting: Physician Assistant

## 2019-09-03 DIAGNOSIS — L97819 Non-pressure chronic ulcer of other part of right lower leg with unspecified severity: Secondary | ICD-10-CM | POA: Diagnosis not present

## 2019-09-03 DIAGNOSIS — I5042 Chronic combined systolic (congestive) and diastolic (congestive) heart failure: Secondary | ICD-10-CM | POA: Diagnosis not present

## 2019-09-03 DIAGNOSIS — I110 Hypertensive heart disease with heart failure: Secondary | ICD-10-CM | POA: Insufficient documentation

## 2019-09-03 DIAGNOSIS — I872 Venous insufficiency (chronic) (peripheral): Secondary | ICD-10-CM | POA: Insufficient documentation

## 2019-09-03 DIAGNOSIS — L97821 Non-pressure chronic ulcer of other part of left lower leg limited to breakdown of skin: Secondary | ICD-10-CM | POA: Diagnosis not present

## 2019-09-03 DIAGNOSIS — E119 Type 2 diabetes mellitus without complications: Secondary | ICD-10-CM | POA: Insufficient documentation

## 2019-09-03 DIAGNOSIS — I890 Lymphedema, not elsewhere classified: Secondary | ICD-10-CM | POA: Insufficient documentation

## 2019-09-03 DIAGNOSIS — J449 Chronic obstructive pulmonary disease, unspecified: Secondary | ICD-10-CM | POA: Insufficient documentation

## 2019-09-03 NOTE — Progress Notes (Addendum)
[name], [name] ([id number]) Visit Report for 09/03/2019 Chief Complaint Document Details Patient Name: Date of Service: [name], [name] 09/03/2019 3:00 PM Medical Record [id number] Patient Account Number: [id number] Date of Birth/Sex: Treating RN: 04/28/1932 (85 y.o. Elam Dutch Primary Care Provider: Sherrie Mustache Other Clinician: Referring Provider: Treating Provider/Extender:Stone III, Mardi Mainland, Loni Muse in Treatment: 2 Information Obtained from: Patient Chief Complaint Bilateral LE Lymphedema with right leg ulcer Electronic Signature(s) Signed: 09/03/2019 6:48:11 PM By: Worthy Keeler PA-C Entered By: Worthy Keeler on 09/03/2019 15:49:54 -------------------------------------------------------------------------------- HPI Details Patient Name: Date of Service: [name] 09/03/2019 3:00 PM Medical Record [id number] Patient Account Number: [id number] Date of Birth/Sex: Treating RN: 23-May-1932 (85 y.o. Elam Dutch Primary Care Provider: Sherrie Mustache Other Clinician: Referring Provider: Treating Provider/Extender:Stone III, Mardi Mainland, Loni Muse in Treatment: 2 History of Present Illness HPI Description: 11/26/15; the patient arrives today I'm not exactly sure who referred her. She is here for our review of bilateral edema of her left greater than right legs. She asked me right away if we could wrap them. I note that she has a history of chronic stage III renal failure. She is noted to have chronic venous insufficiency and lymphedema. I really don't have much more of history here. Dr. [name] who is her cardiologist notes chronic systolic heart failure, her echocardiogram in January 2015 showed an ejection fraction of 35-40%. I do not see any formal venous or arterial studies. She is not a listed diabetic via her problem list in [place] link. As far as discussing the edema in her legs with the patient she really gives a  very circumferential history that is hard to follow. I am not sure that she is really had previous wounds in her legs. There is an asymmetry with the left leg being a lot more edematous than the right I am not sure of the history here either. Almost from her initial intake here. She made a series of paranoid sounding statements that made our intake nurse is concerned that she may be at risk. Most of this directed against the son-in-law. 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 She is apparently refused psychiatric evaluation. 12/30/15; the patient arrives here having presumably missed her one-week follow-up appointment today. I'm not sure how things got this far out to. In any case she never received the stockings that we ordered last time. She continues to have a significant lipid lymphedema of her left leg down to her foot. I try to go over the history of this with her, it is clear that there is some chronicity to this problem but I'm not really sure how long she has had this it is at least several years as she states her husband died 2 weeks ago and that point the edema in the left leg was a lot more than now 02/11/16; the patient arrives accompanied by her daughter who apparently has been staying with her but who his primary residence is in [place]. I'm not completely certain why she didn't return here. We had ordered her Juzo stockings and her last visit in early March patient arrives saying that the company called and they are no longer making these. She is apparently being seen Monday Wednesday and Friday at the lymphedema clinic in [place]. The daughter states that she is being taught to do the lymphedema wraps however makes it [place]  aware that she is returning to [place] on Friday. Apparently a nephew will be taking her to the lymphedema clinic, but nobody seems to think that the  patient can actually do this. In the meantime she appears to have no open wounds on her bilateral lower legs with careful inspection 02/14/16; this is a patient I discharged on Friday. That she was going to follow up with the lymphedema clinic in [place]. We did order her juxtalite stockings. Sometime over the weekend the left leg has become much larger and somewhat more painful. She was seen today in the lymphedema clinic and they refused to wrap her leg. There are really still no open areas here 02/18/16: This patient I discharged last week however she came to [place] from the lymphedema clinic in [facility] with uncontrolled edema of the left leg. A duplex ultrasound of the leg was negative for DVT that we arrange and then we rewrapped her leg with a Profore light. We have attempted on at least 2 occasions to get juxtalite stockings in her on however for one reason or another. 02/25/16; patient has no open wounds. She has not yet obtained her juxtalite's although family was present and insisted they've been ordered and paid for READMISSION 07/09/18 This is a now 85 year old woman who comes to clinic accompanied by her nephew. She was last in our clinic in nearly part of 2017. At that time she did not appear to have open wounds on her legs either. There appeared to be a constellation of problems related to obtaining lower extremity stockings, uncontrolled edema etc. She also had a duplex ultrasound that was negative for DVT. We discharged her in juxta light stockings which the patient states she is used for some period of time but more recently is using some form of lower extremity compression stocking. She apparently developed problems late last month and saw her primary doctor Dr. Gildardo Cranker for bilateral cellulitis of her lower extremity. She has since been followed by home health and they've been putting Unna boots on her. She is supposed to go to see the lymphedema clinic in [place] later  in the week and apparently they wanted [place] to look at this to make sure there were no open areas. Also problematic is that you cannot be seen at the lymphedema clinic with home health therefore apparently home health is discharging on Friday i.e. in 3 days' time The patient has a history of lymphedema left greater than right, psychosis NYD, anemia, anxiety, hypertension and venous insufficiency. She has not had any arterial studies READMISSION 10/08/2018 The patient is back in clinic today with again bilateral lower extremity lymphedema. She was here in 2017 for lymphedema and we discharged her with juxta lites. I do not know that she ever really wore these. For the last several months patient has been going to the lymphedema clinic in [place]. They have been wrapping her 3 times a week. Apparently they placed this on Thursday and the patient took it off on Sunday and there were noted to be wounds on her bilateral lower extremities also what looks to be excoriations from scratching although the patient denied it. They went into the clinic on Monday and the occupational therapist there would not replace the wraps because there was open areas. The patient is complaining of itching but no pain. PMhx; Her past medical history is otherwise unchanged she has lymphedema left greater than right, psychosis NYD, anxiety, hypertension and venous insufficiency. ABIs in our clinic were noncompressible  bilaterally 10/25/2018; 2-week follow-up. The patient has no open wounds. Her edema is under good control. She has an appointment early in January with the lymphedema clinic in [place]. Readmission: 08/20/2019 patient is seen today for reevaluation here in our clinic concerning issues she is having with recurrent openings specifically on the right lower extremity she has bilateral lower extremity lymphedema. We did order her juxta light compression last time she was here unfortunately she does not know  where those are at this point she says they were misplaced. With that being said there is no signs of active infection she does have some drainage on the right lower extremity. Fortunately there is no evidence of active infection at this time which is good news she has a little bit of pain at this location where she is draining. No fevers, chills, nausea, vomiting, or diarrhea. 09/03/2019 on evaluation today patient appears to be doing decently well with regard to her lower extremities although she does have a small wound which is new on the left lower extremity and her right lower extremity is still open as well. She is having some pain and she inquired about pain medication. With that being said I explained to the patient and her family member that we do not actually prescribe pain medications here for the wound care center we normally defer to the primary care or potentially pain management as a referral if were unable to gain help for the patient through the primary care provider. With that being said I am more than happy to send a note to them this afternoon just to see if they will be able to help out with her pain management control for short-term. No fevers, chills, nausea, vomiting, or diarrhea. Electronic Signature(s) Signed: 09/03/2019 6:48:11 PM By: Worthy Keeler PA-C Entered By: Worthy Keeler on 09/03/2019 17:20:11 -------------------------------------------------------------------------------- Physical Exam Details Patient Name: Date of Service: [name], [name] 09/03/2019 3:00 PM Medical Record [id number] Patient Account Number: [id number] Date of Birth/Sex: Treating RN: 1931-12-01 (85 y.o. Elam Dutch Primary Care Provider: Sherrie Mustache Other Clinician: Referring Provider: Treating Provider/Extender:Stone III, Mardi Mainland, Loni Muse in Treatment: 2 Constitutional Obese and well-hydrated in no acute distress. Respiratory normal breathing without  difficulty. clear to auscultation bilaterally. Cardiovascular regular rate and rhythm with normal S1, S2. Psychiatric this patient is able to make decisions and demonstrates good insight into disease process. Alert and Oriented x 3. pleasant and cooperative. Notes Upon evaluation today patient's wound bed actually showed signs of doing better although she still having a lot of pain in regard to her right lower extremity in particular. She has a small wound on the left lower extremity unfortunately. With that being said we still need to go ahead and measure her for the compression stockings specifically Velcro compression wraps which will be hopefully helpful for her with ongoing control of her edema to help prevent infection As well as recurrent ulceration. Electronic Signature(s) Signed: 09/03/2019 6:48:11 PM By: Worthy Keeler PA-C Entered By: Worthy Keeler on 09/03/2019 17:20:48 -------------------------------------------------------------------------------- Physician Orders Details Patient Name: Date of Service: [name] 09/03/2019 3:00 PM Medical Record [id number] Patient Account Number: [id number] Date of Birth/Sex: Treating RN: [birth year] (85 y.o. Elam Dutch Primary Care Provider: Sherrie Mustache Other Clinician: Referring Provider: Treating Provider/Extender:Stone III, Mardi Mainland, Loni Muse in Treatment: 2 Verbal / Phone Orders: No Diagnosis Coding ICD-10 Coding Code Description I89.0 Lymphedema, not elsewhere classified L97.812 Non-pressure chronic ulcer of other part of right lower leg  with fat layer exposed I50.42 Chronic combined systolic (congestive) and diastolic (congestive) heart failure Follow-up Appointments Return Appointment in 1 week. Dressing Change Frequency Wound #7 Right,Distal,Anterior Lower Leg Do not change entire dressing for one week. Wound #8 Left,Anterior Lower Leg Do not change entire dressing for one week. Skin  Barriers/Peri-Wound Care Moisturizing lotion - both legs Wound Cleansing Wound #8 Left,Anterior Lower Leg May shower with protection. Primary Wound Dressing Wound #7 Right,Distal,Anterior Lower Leg Calcium Alginate with Silver - to any weeping areas Wound #8 Left,Anterior Lower Leg Calcium Alginate with Silver - to any weeping areas Secondary Dressing Wound #7 Right,Distal,Anterior Lower Leg ABD pad Wound #8 Left,Anterior Lower Leg Dry Gauze Edema Control 3 Layer Compression System - Bilateral - unna boot top of calves to help prevent wrap from sliding down Avoid standing for long periods of time Elevate legs to the level of the heart or above for 30 minutes daily and/or when sitting, a frequency of: - throughout the day Exercise regularly Electronic Signature(s) Signed: 09/03/2019 6:42:58 PM By: Baruch Gouty RN, BSN Signed: 09/03/2019 6:48:11 PM By: Worthy Keeler PA-C Entered By: Baruch Gouty on 09/03/2019 17:17:49 -------------------------------------------------------------------------------- Problem List Details Patient Name: Date of Service: [name] 09/03/2019 3:00 PM Medical Record [id number] Patient Account Number: [id number] Date of Birth/Sex: Treating RN: February 21, 1932 (85 y.o. Elam Dutch Primary Care Provider: Sherrie Mustache Other Clinician: Referring Provider: Treating Provider/Extender:Stone III, Mardi Mainland, Loni Muse in Treatment: 2 Active Problems ICD-10 Evaluated Encounter Code Description Active Date Today Diagnosis I89.0 Lymphedema, not elsewhere classified 08/20/2019 No Yes L97.812 Non-pressure chronic ulcer of other part of right lower 08/20/2019 No Yes leg with fat layer exposed I50.42 Chronic combined systolic (congestive) and diastolic [id number] No Yes (congestive) heart failure Inactive Problems Resolved Problems Electronic Signature(s) Signed: 09/03/2019 6:48:11 PM By: Worthy Keeler PA-C Entered By: Worthy Keeler on 09/03/2019 15:49:49 -------------------------------------------------------------------------------- Progress Note/History and Physical Details Patient Name: Date of Service: [name] 09/03/2019 3:00 PM Medical Record [id number] Patient Account Number: [id number] Date of Birth/Sex: Treating RN: [birth year] (85 y.o. Elam Dutch Primary Care Provider: Sherrie Mustache Other Clinician: Referring Provider: Treating Provider/Extender:Stone III, Mardi Mainland, Loni Muse in Treatment: 2 Subjective Chief Complaint Information obtained from Patient Bilateral LE Lymphedema with right leg ulcer History of Present Illness (HPI) 11/26/15; the patient arrives today I'm not exactly sure who referred her. She is here for our review of bilateral edema of her left greater than right legs. She asked me right away if we could wrap them. I note that she has a history of chronic stage III renal failure. She is noted to have chronic venous insufficiency and lymphedema. I really don't have much more of history here. Dr. [name] who is her cardiologist notes chronic systolic heart failure, her echocardiogram in January 2015 showed an ejection fraction of 35-40%. I do not see any formal venous or arterial studies. She is not a listed diabetic via her problem list in [place] link. As far as discussing the edema in her legs with the patient she really gives a very circumferential history that is hard to follow. I am not sure that she is really had previous wounds in her legs. There is an asymmetry with the left leg being a lot more edematous than the right I am not sure of the history here either. Almost from her initial intake here. She made a series of paranoid sounding statements that made our intake nurse is concerned that she may be  at risk. Most of this directed against the son-in-law. 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 She is apparently refused psychiatric evaluation. 12/30/15; the patient arrives here having presumably missed her one-week follow-up appointment today. I'm not sure how things got this far out to. In any case she never received the stockings that we ordered last time. She continues to have a significant lipid lymphedema of her left leg down to her foot. I try to go over the history of this with her, it is clear that there is some chronicity to this problem but I'm not really sure how long she has had this it is at least several years as she states her husband died 2 weeks ago and that point the edema in the left leg was a lot more than now 02/11/16; the patient arrives accompanied by her daughter who apparently has been staying with her but who his primary residence is in [place]. I'm not completely certain why she didn't return here. We had ordered her Juzo stockings and her last visit in early March patient arrives saying that the company called and they are no longer making these. She is apparently being seen Monday Wednesday and Friday at the lymphedema clinic in [place]. The daughter states that she is being taught to do the lymphedema wraps however makes it [place] aware that she is returning to [place] on Friday. Apparently a nephew will be taking her to the lymphedema clinic, but nobody seems to think that the patient can actually do this. In the meantime she appears to have no open wounds on her bilateral lower legs with careful inspection 02/14/16; this is a patient I discharged on Friday. That she was going to follow up with the lymphedema clinic in [place]. We did order her juxtalite stockings. Sometime over the weekend the left leg has become much larger and somewhat more painful. She was seen today in the lymphedema clinic and they refused to wrap her leg. There are really still no open areas here 02/18/16: This patient I  discharged last week however she came to [place] from the lymphedema clinic in [facility] with uncontrolled edema of the left leg. A duplex ultrasound of the leg was negative for DVT that we arrange and then we rewrapped her leg with a Profore light. We have attempted on at least 2 occasions to get juxtalite stockings in her on however for one reason or another. 02/25/16; patient has no open wounds. She has not yet obtained her juxtalite's although family was present and insisted they've been ordered and paid for READMISSION 07/09/18 This is a now 85 year old woman who comes to clinic accompanied by her nephew. She was last in our clinic in nearly part of 2017. At that time she did not appear to have open wounds on her legs either. There appeared to be a constellation of problems related to obtaining lower extremity stockings, uncontrolled edema etc. She also had a duplex ultrasound that was negative for DVT. We discharged her in juxta light stockings which the patient states she is used for some period of time but more recently is using some form of lower extremity compression stocking. She apparently developed problems late last month and saw her primary doctor Dr. Gildardo Cranker for bilateral cellulitis of her lower extremity. She has since been followed by home health and they've been putting Unna boots on her. She  is supposed to go to see the lymphedema clinic in [place] later in the week and apparently they wanted [place] to look at this to make sure there were no open areas. Also problematic is that you cannot be seen at the lymphedema clinic with home health therefore apparently home health is discharging on Friday i.e. in 3 days' time The patient has a history of lymphedema left greater than right, psychosis NYD, anemia, anxiety, hypertension and venous insufficiency. She has not had any arterial studies READMISSION 10/08/2018 The patient is back in clinic today with again bilateral lower  extremity lymphedema. She was here in 2017 for lymphedema and we discharged her with juxta lites. I do not know that she ever really wore these. For the last several months patient has been going to the lymphedema clinic in [place]. They have been wrapping her 3 times a week. Apparently they placed this on Thursday and the patient took it off on Sunday and there were noted to be wounds on her bilateral lower extremities also what looks to be excoriations from scratching although the patient denied it. They went into the clinic on Monday and the occupational therapist there would not replace the wraps because there was open areas. The patient is complaining of itching but no pain. PMhx; Her past medical history is otherwise unchanged she has lymphedema left greater than right, psychosis NYD, anxiety, hypertension and venous insufficiency. ABIs in our clinic were noncompressible bilaterally 10/25/2018; 2-week follow-up. The patient has no open wounds. Her edema is under good control. She has an appointment early in January with the lymphedema clinic in [place]. Readmission: 08/20/2019 patient is seen today for reevaluation here in our clinic concerning issues she is having with recurrent openings specifically on the right lower extremity she has bilateral lower extremity lymphedema. We did order her juxta light compression last time she was here unfortunately she does not know where those are at this point she says they were misplaced. With that being said there is no signs of active infection she does have some drainage on the right lower extremity. Fortunately there is no evidence of active infection at this time which is good news she has a little bit of pain at this location where she is draining. No fevers, chills, nausea, vomiting, or diarrhea. 09/03/2019 on evaluation today patient appears to be doing decently well with regard to her lower extremities although she does have a small  wound which is new on the left lower extremity and her right lower extremity is still open as well. She is having some pain and she inquired about pain medication. With that being said I explained to the patient and her family member that we do not actually prescribe pain medications here for the wound care center we normally defer to the primary care or potentially pain management as a referral if were unable to gain help for the patient through the primary care provider. With that being said I am more than happy to send a note to them this afternoon just to see if they will be able to help out with her pain management control for short-term. No fevers, chills, nausea, vomiting, or diarrhea. Patient History Information obtained from Patient. Family History Diabetes - Mother,Paternal Grandparents, No family history of Cancer, Heart Disease, Hereditary Spherocytosis, Hypertension, Kidney Disease, Lung Disease, Seizures, Stroke, Thyroid Problems, Tuberculosis. Social History Never smoker, Marital Status - Widowed, Alcohol Use - Never, Drug Use - No History, Caffeine Use - Daily - coffee. Medical  History Eyes Denies history of Cataracts, Glaucoma, Optic Neuritis Ear/Nose/Mouth/Throat Denies history of Chronic sinus problems/congestion, Middle ear problems Hematologic/Lymphatic Patient has history of Anemia, Lymphedema Denies history of Hemophilia, Human Immunodeficiency Virus, Sickle Cell Disease Respiratory Patient has history of Asthma, Chronic Obstructive Pulmonary Disease (COPD) Denies history of Aspiration, Pneumothorax, Sleep Apnea, Tuberculosis Cardiovascular Patient has history of Congestive Heart Failure - systolic heart failure, Hypertension Denies history of Angina, Arrhythmia, Hypotension, Myocardial Infarction, Peripheral Arterial Disease, Peripheral Venous Disease, Phlebitis, Vasculitis Gastrointestinal Denies history of Cirrhosis , Colitis, Crohnoos, Hepatitis A, Hepatitis  B, Hepatitis C Endocrine Patient has history of Type II Diabetes Denies history of Type I Diabetes Genitourinary Denies history of End Stage Renal Disease Immunological Denies history of Lupus Erythematosus, Raynaudoos, Scleroderma Integumentary (Skin) Denies history of History of Burn Musculoskeletal Denies history of Gout, Rheumatoid Arthritis, Osteoarthritis, Osteomyelitis Neurologic Denies history of Dementia, Neuropathy, Quadriplegia, Paraplegia, Seizure Disorder Oncologic Denies history of Received Chemotherapy, Received Radiation Psychiatric Denies history of Anorexia/bulimia, Confinement Anxiety Patient is treated with Controlled Diet. Hospitalization/Surgery History - cholecystectomy. - tonsillectomy. - cervical spine surgery x2. - partial hysterectomy. - lumbar disc surgery. - esophogastroduodenoscopy. - flexible sigmoidoscopy. Medical And Surgical History Notes Cardiovascular unspecified venous insufficiency Gastrointestinal GERD Endocrine Borderline diabetic Musculoskeletal arthritis Psychiatric unspecified nonpsychotic mental disorder Review of Systems (ROS) Constitutional Symptoms (General Health) Denies complaints or symptoms of Fatigue, Fever, Chills, Marked Weight Change. Respiratory Denies complaints or symptoms of Chronic or frequent coughs, Shortness of Breath. Cardiovascular Denies complaints or symptoms of Chest pain. Psychiatric Denies complaints or symptoms of Claustrophobia, Suicidal. Objective Constitutional Obese and well-hydrated in no acute distress. Vitals Time Taken: 3:19 PM, Height: 58 in, Weight: 135 lbs, BMI: 28.2, Temperature: 98.7 F, Pulse: 88 bpm, Respiratory Rate: 18 breaths/min, Blood Pressure: 180/90 mmHg. Respiratory normal breathing without difficulty. clear to auscultation bilaterally. Cardiovascular regular rate and rhythm with normal S1, S2. Psychiatric this patient is able to make decisions and demonstrates good  insight into disease process. Alert and Oriented x 3. pleasant and cooperative. General Notes: Upon evaluation today patient's wound bed actually showed signs of doing better although she still having a lot of pain in regard to her right lower extremity in particular. She has a small wound on the left lower extremity unfortunately. With that being said we still need to go ahead and measure her for the compression stockings specifically Velcro compression wraps which will be hopefully helpful for her with ongoing control of her edema to help prevent infection As well as recurrent ulceration. Integumentary (Hair, Skin) Wound #7 status is Open. Original cause of wound was Blister. The wound is located on the Right,Distal,Anterior Lower Leg. The wound measures 7cm length x 4.5cm width x 0.1cm depth; 24.74cm^2 area and 2.474cm^3 volume. There is no tunneling or undermining noted. There is a medium amount of serosanguineous drainage noted. There is no granulation within the wound bed. There is a large (67-100%) amount of necrotic tissue within the wound bed including Adherent Slough. Wound #8 status is Open. Original cause of wound was Gradually Appeared. The wound is located on the Left,Anterior Lower Leg. The wound measures 1cm length x 0.5cm width x 0.1cm depth; 0.393cm^2 area and 0.039cm^3 volume. The wound is limited to skin breakdown. There is no tunneling or undermining noted. There is a small amount of serous drainage noted. The wound margin is flat and intact. There is large (67-100%) pink granulation within the wound bed. There is no necrotic tissue within the wound bed. 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 There was a Three Layer Compression Therapy Procedure by Deon Pilling, RN. Post procedure Diagnosis Wound #7: Same as Pre-Procedure Wound #8 Pre-procedure diagnosis of Wound #8 is a Lymphedema located on the Left,Anterior Lower Leg . There was a Three Layer Compression Therapy Procedure by Deon Pilling, RN. Post procedure Diagnosis Wound #8: Same as Pre-Procedure Plan Follow-up Appointments: Return Appointment in 1 week. Dressing Change Frequency: Wound #7 Right,Distal,Anterior Lower Leg: Do not change entire dressing for one week. Wound #8 Left,Anterior Lower Leg: Do not change entire dressing for one week. Skin Barriers/Peri-Wound Care: Moisturizing lotion - both legs Wound Cleansing: Wound #8 Left,Anterior Lower Leg: May shower with protection. Primary Wound Dressing: Wound #7 Right,Distal,Anterior Lower Leg: Calcium Alginate with Silver - to any weeping areas Wound #8 Left,Anterior Lower Leg: Calcium Alginate with Silver - to any weeping areas Secondary Dressing: Wound #7 Right,Distal,Anterior Lower Leg: ABD pad Wound #8 Left,Anterior Lower Leg: Dry Gauze Edema Control: 3 Layer Compression System - Bilateral - unna boot top of calves to help prevent wrap from sliding down Avoid standing for long periods of time Elevate legs to the level of the heart or above for 30 minutes daily and/or when sitting, a frequency of: - throughout the day Exercise regularly 1 I would recommend at this point that we continue with the silver alginate dressing as the patient seems to have done well with this. 2. We will also continue with the 3 layer compression wraps bilaterally she seemed to tolerate this fairly well. 3. With regard to her pain I do believe that she is experiencing pain and discomfort. With that being said being that we do not prescribe pain medications I did advise that I would be happy to send a copy of my note today inquiring as to  whether or not her primary care provider would be willing to help in short-term with pain management or if we need to actually make a referral to a pain management clinic on her behalf. I will copy this note and fax it to them as well later this afternoon and I did advise the patient to contact the office or have her family member do so tomorrow if they had not heard anything by sometime after lunch. We will see patient back for reevaluation in 1 week here in the clinic. If anything worsens or changes patient will contact our office for additional recommendations. Patient's primary care provider is [facility] they were not able to tell me what doctor they see currently as apparently her doctor actually moved to a different location/city. Nonetheless I would like to inquire as to whether or not [facility] would be willing to for short-term help with management of her pain secondary to the lymphedema and ulcers on her bilateral lower extremities. If not I will be happy to make a referral to pain management but I did not want to have to send her in through the pain management system if they were willing to do this short-term for her. We will send a copy of this note as well to her primary care provider. Electronic Signature(s) Signed: 09/17/2019 5:12:22 PM By: Worthy Keeler PA-C Signed: 10/02/2019 10:53:45 AM By: Baruch Gouty RN, BSN Previous Signature:  09/03/2019 6:48:11 PM Version By: Worthy Keeler PA-C Entered By: Baruch Gouty on 09/10/2019 17:32:27 -------------------------------------------------------------------------------- HxROS Details Patient Name: Date of Service: [name] 09/03/2019 3:00 PM Medical Record [id number] Patient Account Number: [id number] Date of Birth/Sex: Treating RN: [birth year] (85 y.o. Elam Dutch Primary Care Provider: Sherrie Mustache Other Clinician: Referring Provider: Treating Provider/Extender:Stone III,  Mardi Mainland, Loni Muse in Treatment: 2 Label Progress Note Print Version as History and Physical for this encounter Information Obtained From Patient Constitutional Symptoms (General Health) Complaints and Symptoms: Negative for: Fatigue; Fever; Chills; Marked Weight Change Respiratory Complaints and Symptoms: Negative for: Chronic or frequent coughs; Shortness of Breath Medical History: Positive for: Asthma; Chronic Obstructive Pulmonary Disease (COPD) Negative for: Aspiration; Pneumothorax; Sleep Apnea; Tuberculosis Cardiovascular Complaints and Symptoms: Negative for: Chest pain Medical History: Positive for: Congestive Heart Failure - systolic heart failure; Hypertension Negative for: Angina; Arrhythmia; Hypotension; Myocardial Infarction; Peripheral Arterial Disease; Peripheral Venous Disease; Phlebitis; Vasculitis Past Medical History Notes: unspecified venous insufficiency Psychiatric Complaints and Symptoms: Negative for: Claustrophobia; Suicidal Medical History: Negative for: Anorexia/bulimia; Confinement Anxiety Past Medical History Notes: unspecified nonpsychotic mental disorder Eyes Medical History: Negative for: Cataracts; Glaucoma; Optic Neuritis Ear/Nose/Mouth/Throat Medical History: Negative for: Chronic sinus problems/congestion; Middle ear problems Hematologic/Lymphatic Medical History: Positive for: Anemia; Lymphedema Negative for: Hemophilia; Human Immunodeficiency Virus; Sickle Cell Disease Gastrointestinal Medical History: Negative for: Cirrhosis ; Colitis; Crohns; Hepatitis A; Hepatitis B; Hepatitis C Past Medical History Notes: GERD Endocrine Medical History: Positive for: Type II Diabetes Negative for: Type I Diabetes Past Medical History Notes: Borderline diabetic Treated with: Diet Genitourinary Medical History: Negative for: End Stage Renal Disease Immunological Medical History: Negative for: Lupus Erythematosus; Raynauds;  Scleroderma Integumentary (Skin) Medical History: Negative for: History of Burn Musculoskeletal Medical History: Negative for: Gout; Rheumatoid Arthritis; Osteoarthritis; Osteomyelitis Past Medical History Notes: arthritis Neurologic Medical History: Negative for: Dementia; Neuropathy; Quadriplegia; Paraplegia; Seizure Disorder Oncologic Medical History: Negative for: Received Chemotherapy; Received Radiation Immunizations Pneumococcal Vaccine: Received Pneumococcal Vaccination: Yes Immunization Notes: Pt. unsure of last tetanus shot. Implantable Devices None Hospitalization / Surgery History Type of Hospitalization/Surgery cholecystectomy tonsillectomy cervical spine surgery x2 partial hysterectomy lumbar disc surgery esophogastroduodenoscopy flexible sigmoidoscopy Family and Social History Cancer: No; Diabetes: Yes - Mother,Paternal Grandparents; Heart Disease: No; Hereditary Spherocytosis: No; Hypertension: No; Kidney Disease: No; Lung Disease: No; Seizures: No; Stroke: No; Thyroid Problems: No; Tuberculosis: No; Never smoker; Marital Status - Widowed; Alcohol Use: Never; Drug Use: No History; Caffeine Use: Daily - coffee; Financial Concerns: No; Food, Clothing or Shelter Needs: No; Support System Lacking: No; Transportation Concerns: No Physician Affirmation I have reviewed and agree with the above information. Electronic Signature(s) Signed: 09/03/2019 6:42:58 PM By: Baruch Gouty RN, BSN Signed: 09/03/2019 6:48:11 PM By: Worthy Keeler PA-C Entered By: Worthy Keeler on 09/03/2019 17:20:25 -------------------------------------------------------------------------------- SuperBill Details Patient Name: Date of Service: [name] 09/03/2019 Medical Record [id number] Patient Account Number: [id number] Date of Birth/Sex: Treating RN: [birth year] (85 y.o. Martyn Malay, Linda Primary Care Provider: Sherrie Mustache Other Clinician: Referring Provider:  Treating Provider/Extender:Stone III, Mardi Mainland, Loni Muse in Treatment: 2 Diagnosis Coding ICD-10 Codes Code Description I89.0 Lymphedema, not elsewhere classified L97.812 Non-pressure chronic ulcer of other part of right lower leg with fat layer exposed I50.42 Chronic combined systolic (congestive) and diastolic (congestive) heart failure Facility Procedures CPT4: Description Modifier Quantity Code [id number] BILATERAL: Application of multi-layer venous compression system; leg 1 (below knee), including ankle and foot. Physician Procedures CPT4 Code Description: [id number] [place]  LEVEL 4 - EST PT ICD-10 Diagnosis Description I89.0 Lymphedema, not elsewhere classified L97.812 Non-pressure chronic ulcer of other part of right lower I50.42 Chronic combined systolic (congestive) and  diastolic (co Modifier: leg with fat la ngestive) heart Quantity: 1 yer exposed failure Electronic Signature(s) Signed: 09/03/2019 6:48:11 PM By: Worthy Keeler PA-C Entered By: Worthy Keeler on 09/03/2019 17:23:07

## 2019-09-10 ENCOUNTER — Ambulatory Visit (HOSPITAL_BASED_OUTPATIENT_CLINIC_OR_DEPARTMENT_OTHER): Payer: Medicare Other | Admitting: Physician Assistant

## 2019-09-11 ENCOUNTER — Ambulatory Visit: Payer: Medicare Other | Admitting: Family

## 2019-09-12 ENCOUNTER — Ambulatory Visit (HOSPITAL_BASED_OUTPATIENT_CLINIC_OR_DEPARTMENT_OTHER): Payer: Medicare Other | Admitting: Internal Medicine

## 2019-10-06 ENCOUNTER — Other Ambulatory Visit: Payer: Self-pay | Admitting: *Deleted

## 2019-10-06 DIAGNOSIS — I5022 Chronic systolic (congestive) heart failure: Secondary | ICD-10-CM

## 2019-10-07 NOTE — Progress Notes (Signed)
SHALETHA, TRA ([id number]) Visit Report for 08/20/2019 Allergy List Details Patient Name: Date of Service: [name], [name] 08/20/2019 2:45 PM Medical Record [id number] Patient Account Number: [id number] Date of Birth/Sex: Treating RN: 1932-06-13 (85 y.o. Orvan Falconer Primary Care Keval Nam: Sherrie Mustache Other Clinician: Referring Javontay Vandam: Treating Kathalina Ostermann/Extender:Stone III, Mardi Mainland, Loni Muse in Treatment: 0 Allergies Active Allergies aspirin Reaction: upset stomach Severity: Moderate codeine Reaction: "makes by body go crazy; cramps" Severity: Moderate latex Reaction: nausea and vomiting Severity: Moderate Allergy Notes Electronic Signature(s) Signed: 10/07/2019 3:01:15 PM By: Carlene Coria RN Entered By: Carlene Coria on 08/20/2019 15:48:33 -------------------------------------------------------------------------------- Arrival Information Details Patient Name: Date of Service: [name] 08/20/2019 2:45 PM Medical Record [id number] Patient Account Number: [id number] Date of Birth/Sex: Treating RN: 08/08/1932 (85 y.o. Orvan Falconer Primary Care Nixon Sparr: Sherrie Mustache Other Clinician: Referring Lluvia Gwynne: Treating Kenta Laster/Extender:Stone III, Mardi Mainland, Loni Muse in Treatment: 0 Visit Information Patient Arrived: Cane Arrival Time: 15:41 Accompanied By: nephew Transfer Assistance: None Patient Identification Verified: Yes Secondary Verification Process Yes Completed: Patient Requires Transmission- No Based Precautions: Patient Has Alerts: Yes Patient Alerts: right non compressable History Since Last Visit All ordered tests and consults were completed: No Added or deleted any medications: No Any new allergies or adverse reactions: No Any new allergies or adverse reactions: No Had a fall or experienced change in activities of daily living that may affect risk of falls: No Signs or symptoms of abuse/neglect  since last No visito Hospitalized since last visit: No Implantable device outside of the clinic excluding No cellular tissue based products placed in the center since last visit: Has Dressing in Place as Prescribed: Yes Has Compression in Place as Prescribed: Yes Pain Present Now: No Electronic Signature(s) Signed: 10/07/2019 3:01:15 PM By: Carlene Coria RN Entered By: Carlene Coria on 08/20/2019 15:59:36 -------------------------------------------------------------------------------- Clinic Level of Care Assessment Details Patient Name: Date of Service: [name], [name] 08/20/2019 2:45 PM Medical Record [id number] Patient Account Number: [id number] Date of Birth/Sex: Treating RN: 1932/05/21 (85 y.o. [name] Primary Care Kaytee Taliercio: Sherrie Mustache Other Clinician: Referring Navi Erber: Treating Jermell Holeman/Extender:Stone III, Mardi Mainland, Loni Muse in Treatment: 0 Clinic Level of Care Assessment Items TOOL 1 Quantity Score X - Use when EandM and Procedure is performed on INITIAL visit 1 0 ASSESSMENTS - Nursing Assessment / Reassessment X - General Physical Exam (combine w/ comprehensive assessment (listed just below) 1 20 when performed on new pt. evals) X - Comprehensive Assessment (HX, ROS, Risk Assessments, Wounds Hx, etc.) 1 25 ASSESSMENTS - Wound and Skin Assessment / Reassessment []  - Dermatologic / Skin Assessment (not related to wound area) 0 ASSESSMENTS - Ostomy and/or Continence Assessment and Care []  - Incontinence Assessment and Management 0 []  - Ostomy Care Assessment and Management (repouching, etc.) 0 PROCESS - Coordination of Care X - Simple Patient / Family Education for ongoing care 1 15 []  - Complex (extensive) Patient / Family Education for ongoing care 0 X - Staff obtains Programmer, systems, Records, Test Results / Process Orders 1 10 []  - Staff telephones HHA, Nursing Homes / Clarify orders / etc 0 []  - Routine Transfer to another Facility  (non-emergent condition) 0 []  - Routine Hospital Admission (non-emergent condition) 0 X - New Admissions / Biomedical engineer / Ordering NPWT, Apligraf, etc. 1 15 []  - Emergency Hospital Admission (emergent condition) 0 PROCESS - Special Needs []  - Pediatric / Minor Patient Management 0 []  - Isolation Patient Management 0 []  - Hearing / Language / Visual special needs 0 []  -  Assessment of Community assistance (transportation, D/C planning, etc.) 0 []  - Additional assistance / Altered mentation 0 []  - Support Surface(s) Assessment (bed, cushion, seat, etc.) 0 INTERVENTIONS - Miscellaneous []  - External ear exam 0 []  - Patient Transfer (multiple staff / Civil Service fast streamer / Similar devices) 0 []  - Simple Staple / Suture removal (25 or less) 0 []  - Complex Staple / Suture removal (26 or more) 0 []  - Hypo/Hyperglycemic Management (do not check if billed separately) 0 X - Ankle / Brachial Index (ABI) - do not check if billed separately 1 15 Has the patient been seen at the hospital within the last three years: Yes Total Score: 100 Level Of Care: New/Established - Level 3 Electronic Signature(s) Signed: 08/20/2019 6:50:57 PM By: Levan Hurst RN, BSN Entered By: Levan Hurst on 08/20/2019 18:47:03 -------------------------------------------------------------------------------- Compression Therapy Details Patient Name: Date of Service: [name] 08/20/2019 2:45 PM Medical Record [id number] Patient Account Number: [id number] Date of Birth/Sex: Treating RN: Sep 21, 1932 (85 y.o. [name] Primary Care Angelee Bahr: Sherrie Mustache Other Clinician: Referring Airon Sahni: Treating Shawnique Mariotti/Extender:Stone III, Mardi Mainland, Loni Muse in Treatment: 0 Compression Therapy Performed for Wound Wound #7 Right,Distal,Anterior Lower Leg Assessment: Performed By: Clinician Levan Hurst, RN Compression Type: Three Layer Post Procedure Diagnosis Same as  Pre-procedure Electronic Signature(s) Signed: 08/20/2019 6:50:57 PM By: Levan Hurst RN, BSN Entered By: Levan Hurst on 08/20/2019 16:37:17 -------------------------------------------------------------------------------- Compression Therapy Details Patient Name: Date of Service: [name] 08/20/2019 2:45 PM Medical Record [id number] Patient Account Number: [id number] Date of Birth/Sex: Treating RN: 1932/03/08 (85 y.o. [name] Primary Care Adonijah Baena: Sherrie Mustache Other Clinician: Referring Lisa-Marie Rueger: Treating [name] Hohn/Extender:Stone III, Mardi Mainland, Loni Muse in Treatment: 0 Compression Therapy Performed for Wound NonWound Condition Lymphedema - Bilateral Leg Assessment: Performed By: Clinician Levan Hurst, RN Compression Type: Three Layer Post Procedure Diagnosis Same as Pre-procedure Electronic Signature(s) Signed: 08/20/2019 6:50:57 PM By: Levan Hurst RN, BSN Entered By: Levan Hurst on 08/20/2019 16:37:29 -------------------------------------------------------------------------------- Encounter Discharge Information Details Patient Name: Date of Service: [name] 08/20/2019 2:45 PM Medical Record [id number] Patient Account Number: [id number] Date of Birth/Sex: Treating RN: 1932/01/18 (85 y.o. Debby Bud Primary Care Tami Barren: Sherrie Mustache Other Clinician: Referring Martena Emanuele: Treating Tristian Sickinger/Extender:Stone III, Mardi Mainland, Loni Muse in Treatment: 0 Encounter Discharge Information Items Discharge Condition: Stable Ambulatory Status: Cane Discharge Destination: Home Transportation: Private Auto Accompanied By: son Schedule Follow-up Appointment: Yes Clinical Summary of Care: Electronic Signature(s) Signed: 08/20/2019 6:26:11 PM By: Deon Pilling Entered By: Deon Pilling on 08/20/2019 17:22:23 -------------------------------------------------------------------------------- Lower Extremity  Assessment Details Patient Name: Date of Service: [name], [name] 08/20/2019 2:45 PM Medical Record [id number] Patient Account Number: [id number] Date of Birth/Sex: Treating RN: [birth year] (85 y.o. Orvan Falconer Primary Care Alek Borges: Sherrie Mustache Other Clinician: Referring Damisha Wolff: Treating Jamerson Vonbargen/Extender:Stone III, Mardi Mainland, Loni Muse in Treatment: 0 Edema Assessment Assessed: [Left: No] [Right: Yes] Edema: [Left: Ye] [Right: s] Calf Left: Right: Point of Measurement: 41 cm From Medial Instep cm 36 cm Ankle Left: Right: Point of Measurement: 12 cm From Medial Instep cm 30 cm Notes non compressable right Electronic Signature(s) Signed: 10/07/2019 3:01:15 PM By: Carlene Coria RN Entered By: Carlene Coria on 08/20/2019 15:59:02 -------------------------------------------------------------------------------- Multi-Disciplinary Care Plan Details Patient Name: Date of Service: [name] 08/20/2019 2:45 PM Medical Record [id number] Patient Account Number: [id number] Date of Birth/Sex: Treating RN: 1931/11/26 (85 y.o. [name] Primary Care Taron Conrey: Other Clinician: Sherrie Mustache Referring Skila Rollins: Treating Kimiyo Carmicheal/Extender:Stone III, Mardi Mainland, Loni Muse in Treatment: 0  Active Inactive Venous Leg Ulcer Nursing Diagnoses: Actual venous Insuffiency (use after diagnosis is confirmed) Knowledge deficit related to disease process and management Goals: Patient will maintain optimal edema control Date Initiated: 08/20/2019 Target Resolution Date: 09/19/2019 Goal Status: Active Patient/caregiver will verbalize understanding of disease process and disease management Date Initiated: 08/20/2019 Target Resolution Date: 09/19/2019 Goal Status: Active Interventions: Assess peripheral edema status every visit. Compression as ordered Provide education on venous insufficiency Notes: Wound/Skin Impairment Nursing  Diagnoses: Impaired tissue integrity Knowledge deficit related to ulceration/compromised skin integrity Goals: Patient/caregiver will verbalize understanding of skin care regimen Date Initiated: 08/20/2019 Target Resolution Date: 09/19/2019 Goal Status: Active Interventions: Assess patient/caregiver ability to obtain necessary supplies Assess patient/caregiver ability to perform ulcer/skin care regimen upon admission and as needed Assess ulceration(s) every visit Provide education on ulcer and skin care Notes: Electronic Signature(s) Signed: 08/20/2019 6:50:57 PM By: Levan Hurst RN, BSN Entered By: Levan Hurst on 08/20/2019 18:46:35 -------------------------------------------------------------------------------- Pain Assessment Details Patient Name: Date of Service: [name] 08/20/2019 2:45 PM Medical Record [id number] Patient Account Number: [id number] Date of Birth/Sex: Treating RN: 21-Mar-1932 (85 y.o. Orvan Falconer Primary Care Madilynne Mullan: Sherrie Mustache Other Clinician: Referring Aizlyn Schifano: Treating Ziva Nunziata/Extender:Stone III, Mardi Mainland, Loni Muse in Treatment: 0 Active Problems Location of Pain Severity and Description of Pain Patient Has Paino Yes Site Locations With Dressing Change: Yes Duration of the Pain. Constant / Intermittento Intermittent How Long Does it Lasto Hours: Minutes: 15 Rate the pain. Current Pain Level: 2 Worst Pain Level: 5 Least Pain Level: 0 Tolerable Pain Level: 5 Character of Pain Describe the Pain: Aching Pain Management and Medication Current Pain Management: Medication: No Cold Application: No Rest: Yes Massage: No Activity: No T.E.N.S.: No Heat Application: No Leg drop or elevation: No Is the Current Pain Management Adequate: Inadequate How does your wound impact your activities of daily livingo Sleep: No Bathing: No Appetite: No Relationship With Others: No Bladder Continence: No Emotions:  No Bowel Continence: No Work: No Toileting: No Drive: No Dressing: No Hobbies: No Electronic Signature(s) Signed: 10/07/2019 3:01:15 PM By: Carlene Coria RN Entered By: Carlene Coria on 08/20/2019 16:03:23 -------------------------------------------------------------------------------- Patient/Caregiver Education Details Patient Name: Date of Service: [name] 10/21/2020andnbsp2:45 PM Medical Record [phone] Patient Account Number: [id number] Date of Birth/Gender: Treating RN: October 22, 1932 (85 y.o. [name] Primary Care Physician: Sherrie Mustache Other Clinician: Referring Physician: Treating Physician/Extender:Stone III, Mardi Mainland, Loni Muse in Treatment: 0 Education Assessment Education Provided To: Patient Education Topics Provided Venous: Methods: Explain/Verbal Responses: State content correctly Wound/Skin Impairment: Methods: Explain/Verbal Responses: State content correctly Electronic Signature(s) Signed: 08/20/2019 6:50:57 PM By: Levan Hurst RN, BSN Entered By: Levan Hurst on 08/20/2019 18:46:43 -------------------------------------------------------------------------------- Wound Assessment Details Patient Name: Date of Service: [name] 08/20/2019 2:45 PM Medical Record [id number] Patient Account Number: [id number] Date of Birth/Sex: Treating RN: 1932-05-30 (85 y.o. Orvan Falconer Primary Care Taylon Coole: Sherrie Mustache Other Clinician: Referring Ayanah Snader: Treating Tkeyah Burkman/Extender:Stone III, Mardi Mainland, Loni Muse in Treatment: 0 Wound Status Wound Number: 7 Primary Lymphedema Etiology: Wound Location: Right Lower Leg - Anterior, Distal Wound Open Wounding Event: Blister Status: Date Acquired: 07/31/2019 Comorbid Anemia, Lymphedema, Asthma, Chronic Weeks Of Treatment: 0 History: Obstructive Pulmonary Disease (COPD), Clustered Wound: No Congestive Heart Failure, Hypertension, Type II  Diabetes Photos Wound Measurements Length: (cm) 8 % Reduc Width: (cm) 5 % Reduc Depth: (cm) 0.1 Epithel Area: (cm) 31.416 Tunnel Volume: (cm) 3.142 Underm Wound Description Classification: Full Thickness Without Exposed [place] Exudate Medium  Amount: Exudate Serosanguineous Type: Exudate red, brown Color: Wound Bed Granulation Amount: Medium (34-66%) Granulation Quality: Pink Fascia Necrotic Amount: Medium (34-66%) Fat Lay Necrotic Quality: Adherent Slough Tendon Muscle Joint E Bone Ex dor After Cleansing: No /Fibrino Yes Exposed Structure Exposed: No er (Subcutaneous Tissue) Exposed: Yes Exposed: No Exposed: No xposed: No posed: No tion in Area: 0% tion in Volume: 0% ialization: None ing: No ining: No Electronic Signature(s) Signed: 08/26/2019 3:44:28 PM By: Mikeal Hawthorne EMT/HBOT Signed: 10/07/2019 3:01:15 PM By: Carlene Coria RN Entered By: Mikeal Hawthorne on 08/26/2019 12:11:33 -------------------------------------------------------------------------------- Vitals Details Patient Name: Date of Service: [name] 08/20/2019 2:45 PM Medical Record [id number] Patient Account Number: [id number] Date of Birth/Sex: Treating RN: April 16, 1932 (85 y.o. Orvan Falconer Primary Care Raydon Chappuis: Sherrie Mustache Other Clinician: Referring Aldora Perman: Treating Zaakirah Kistner/Extender:Stone III, Mardi Mainland, Loni Muse in Treatment: 0 Vital Signs Time Taken: 05:46 Temperature (F): 99 Height (in): 58 Pulse (bpm): 85 Source: Stated Respiratory Rate (breaths/min): 18 Weight (lbs): 135 Blood Pressure (mmHg): 173/90 Source: Stated Reference Range: 80 - 120 mg / dl Body Mass Index (BMI): 28.2 Electronic Signature(s) Signed: 10/07/2019 3:01:15 PM By: Carlene Coria RN Entered By: Carlene Coria on 08/20/2019 15:48:19

## 2019-10-07 NOTE — Progress Notes (Signed)
[name], [name] ([id number]) Visit Report for 08/20/2019 Abuse/Suicide Risk Screen Details Patient Name: Date of Service: [name], [name] 08/20/2019 2:45 PM Medical Record [id number] Patient Account Number: [id number] Date of Birth/Sex: Treating RN: 1931-12-23 (85 y.o. Orvan Falconer Primary Care Myranda Pavone: Sherrie Mustache Other Clinician: Referring Kinston Magnan: Treating Anna-Marie Coller/Extender:Stone III, Mardi Mainland, Loni Muse in Treatment: 0 Abuse/Suicide Risk Screen Items Answer ABUSE RISK SCREEN: Has anyone close to you tried to hurt or harm you recentlyo No Do you feel uncomfortable with anyone in your familyo No Has anyone forced you do things that you didnt want to doo No Electronic Signature(s) Signed: 10/07/2019 3:01:15 PM By: Carlene Coria RN Entered By: Carlene Coria on 08/20/2019 15:50:56 -------------------------------------------------------------------------------- Activities of Daily Living Details Patient Name: Date of Service: [name], [name] 08/20/2019 2:45 PM Medical Record [id number] Patient Account Number: [id number] Date of Birth/Sex: Treating RN: 1932-08-05 (85 y.o. Orvan Falconer Primary Care Ardys Hataway: Sherrie Mustache Other Clinician: Referring Laderrick Wilk: Treating Braeleigh Pyper/Extender:Stone III, Mardi Mainland, Loni Muse in Treatment: 0 Activities of Daily Living Items Answer Activities of Daily Living (Please select one for each item) Drive Automobile Not Able Take Medications Completely Able Use Telephone Completely Able Care for Appearance Completely Able Use Toilet Completely Able Bath / Shower Completely Able Dress Self Completely Able Feed Self Completely Able Walk Completely Able Get In / Out Bed Completely Able Housework Completely Able Prepare Meals Completely [place] for Self Need Assistance Electronic Signature(s) Signed: 10/07/2019 3:01:15 PM By: Carlene Coria RN Entered By: Carlene Coria  on 08/20/2019 15:51:33 -------------------------------------------------------------------------------- Education Screening Details Patient Name: Date of Service: [name] 08/20/2019 2:45 PM Medical Record [id number] Patient Account Number: [id number] Date of Birth/Sex: Treating RN: [birth year] (85 y.o. Orvan Falconer Primary Care Dacari Beckstrand: Sherrie Mustache Other Clinician: Referring Othello Sgroi: Treating Damonica Chopra/Extender:Stone III, Mardi Mainland, Loni Muse in Treatment: 0 Primary Learner Assessed: Patient Learning Preferences/Education Level/Primary Language Learning Preference: Explanation Highest Education Level: College or Above Preferred Language: English Cognitive Barrier Language Barrier: No Translator Needed: No Memory Deficit: No Emotional Barrier: No Cultural/Religious Beliefs Affecting Medical Care: No Physical Barrier Impaired Vision: No Impaired Hearing: No Decreased Hand dexterity: No Knowledge/Comprehension Knowledge Level: High Comprehension Level: High Ability to understand written High instructions: Ability to understand verbal High instructions: Motivation Anxiety Level: Calm Cooperation: Cooperative Education Importance: Acknowledges Need Interest in Health Problems: Asks Questions Perception: Coherent Willingness to Engage in Self- High Management Activities: Readiness to Engage in Self- High Management Activities: Electronic Signature(s) Signed: 10/07/2019 3:01:15 PM By: Carlene Coria RN Entered By: Carlene Coria on 08/20/2019 15:52:01 -------------------------------------------------------------------------------- Fall Risk Assessment Details Patient Name: Date of Service: [name] 08/20/2019 2:45 PM Medical Record [id number] Patient Account Number: [id number] Date of Birth/Sex: Treating RN: 1932/08/03 (85 y.o. Orvan Falconer Primary Care Talor Desrosiers: Sherrie Mustache Other Clinician: Referring Jeremyah Jelley:  Treating Loreen Bankson/Extender:Stone III, Mardi Mainland, Loni Muse in Treatment: 0 Fall Risk Assessment Items Have you had 2 or more falls in the last 12 monthso 0 No Have you had any fall that resulted in injury in the last 12 monthso 0 No FALLS RISK SCREEN History of falling - immediate or within 3 months 0 No Secondary diagnosis (Do you have 2 or more medical diagnoseso) 0 No Ambulatory aid None/bed rest/wheelchair/nurse 0 No Crutches/cane/walker 0 No Furniture 0 No Intravenous therapy Access/Saline/Heparin Lock 0 No Weak (short steps with or without shuffle, stooped but able to lift head 0 No while walking, may seek support from furniture) Impaired (short steps  with shuffle, may have difficulty arising from chair, 0 No head down, impaired balance) Mental Status Oriented to own ability 0 No Overestimates or forgets limitations 0 No Risk Level: Low Risk Score: 0 Electronic Signature(s) Signed: 10/07/2019 3:01:15 PM By: Carlene Coria RN Entered By: Carlene Coria on 08/20/2019 15:52:09 -------------------------------------------------------------------------------- Foot Assessment Details Patient Name: Date of Service: [name] 08/20/2019 2:45 PM Medical Record [id number] Patient Account Number: [id number] Date of Birth/Sex: Treating RN: 09/27/1932 (85 y.o. Orvan Falconer Primary Care Fread Kottke: Sherrie Mustache Other Clinician: Referring Reg Bircher: Treating Shelaine Frie/Extender:Stone III, Mardi Mainland, Loni Muse in Treatment: 0 Foot Assessment Items Site Locations + = Sensation present, - = Sensation absent, C = Callus, U = Ulcer R = Redness, W = Warmth, M = Maceration, PU = Pre-ulcerative lesion F = Fissure, S = Swelling, D = Dryness Assessment Right: Left: Other Deformity: No No Prior Foot Ulcer: No No Prior Amputation: No No Charcot Joint: No No Ambulatory Status: Ambulatory Without Help Gait: Steady Electronic Signature(s) Signed: 10/07/2019 3:01:15  PM By: Carlene Coria RN Entered By: Carlene Coria on 08/20/2019 15:54:48 -------------------------------------------------------------------------------- Nutrition Risk Screening Details Patient Name: Date of Service: [name], [name] 08/20/2019 2:45 PM Medical Record [id number] Patient Account Number: [id number] Date of Birth/Sex: Treating RN: 1932/02/10 (85 y.o. Orvan Falconer Primary Care Devrin Monforte: Sherrie Mustache Other Clinician: Referring Jasmeet Manton: Treating Hilary Pundt/Extender:Stone III, Mardi Mainland, Loni Muse in Treatment: 0 Height (in): 58 Weight (lbs): 135 Body Mass Index (BMI): 28.2 Nutrition Risk Screening Items Score Screening NUTRITION RISK SCREEN: I have an illness or condition that made me change the kind and/or 0 No amount of food I eat I eat fewer than two meals per day 0 No I eat few fruits and vegetables, or milk products 0 No I have three or more drinks of beer, liquor or wine almost every day 0 No I have tooth or mouth problems that make it hard for me to eat 0 No I don't always have enough money to buy the food I need 0 No I eat alone most of the time 0 No I take three or more different prescribed or over-the-counter drugs a day 1 Yes 0 No Without wanting to, I have lost or gained 10 pounds in the last six months I am not always physically able to shop, cook and/or feed myself 2 Yes Nutrition Protocols Good Risk Protocol Provide education on Moderate Risk Protocol 0 nutrition High Risk Proctocol Risk Level: Moderate Risk Score: 3 Electronic Signature(s) Signed: 10/07/2019 3:01:15 PM By: Carlene Coria RN Entered By: Carlene Coria on 08/20/2019 15:52:58

## 2019-10-07 NOTE — Progress Notes (Signed)
[name], [name] ([id number]) Visit Report for 08/20/2019 Chief Complaint Document Details Patient Name: Date of Service: [name], [name] 08/20/2019 2:45 PM Medical Record [id number] Patient Account Number: [id number] Date of Birth/Sex: Treating RN: 1932-02-11 (85 y.o. [name] Primary Care Provider: Sherrie Mustache Other Clinician: Referring Provider: Treating Provider/Extender:Stone III, Mardi Mainland, Loni Muse in Treatment: 0 Information Obtained from: Patient Chief Complaint Bilateral LE Lymphedema with right leg ulcer Electronic Signature(s) Signed: 08/20/2019 6:13:43 PM By: Worthy Keeler PA-C Entered By: Worthy Keeler on 08/20/2019 16:17:09 -------------------------------------------------------------------------------- HPI Details Patient Name: Date of Service: [name] 08/20/2019 2:45 PM Medical Record [id number] Patient Account Number: [id number] Date of Birth/Sex: Treating RN: 1932/07/03 (85 y.o. [name] Primary Care Provider: Sherrie Mustache Other Clinician: Referring Provider: Treating Provider/Extender:Stone III, Mardi Mainland, Loni Muse in Treatment: 0 History of Present Illness HPI Description: 11/26/15; the patient arrives today I'm not exactly sure who referred her. She is here for our review of bilateral edema of her left greater than right legs. She asked me right away if we could wrap them. I note that she has a history of chronic stage III renal failure. She is noted to have chronic venous insufficiency and lymphedema. I really don't have much more of history here. Dr. [name] who is her cardiologist notes chronic systolic heart failure, her echocardiogram in January 2015 showed an ejection fraction of 35-40%. I do not see any formal venous or arterial studies. She is not a listed diabetic via her problem list in [place] link. As far as discussing the edema in her legs with the patient she really gives  a very circumferential history that is hard to follow. I am not sure that she is really had previous wounds in her legs. There is an asymmetry with the left leg being a lot more edematous than the right I am not sure of the history here either. Almost from her initial intake here. She made a series of paranoid sounding statements that made our intake nurse is concerned that she may be at risk. Most of this directed against the son-in-law. 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 She is apparently refused psychiatric evaluation. 12/30/15; the patient arrives here having presumably missed her one-week follow-up appointment today. I'm not sure how things got this far out to. In any case she never received the stockings that we ordered last time. She continues to have a significant lipid lymphedema of her left leg down to her foot. I try to go over the history of this with her, it is clear that there is some chronicity to this problem but I'm not really sure how long she has had this it is at least several years as she states her husband died 2 weeks ago and that point the edema in the left leg was a lot more than now 02/11/16; the patient arrives accompanied by her daughter who apparently has been staying with her but who his primary residence is in [place]. I'm not completely certain why she didn't return here. We had ordered her Juzo stockings and her last visit in early March patient arrives saying that the company called and they are no longer making these. She is apparently being seen Monday Wednesday and Friday at the lymphedema clinic in [place]. The daughter states that she is being taught to do the lymphedema wraps however makes it [place]  aware that she is returning to [place] on Friday. Apparently a nephew will be taking her to the lymphedema clinic, but nobody seems to think that  the patient can actually do this. In the meantime she appears to have no open wounds on her bilateral lower legs with careful inspection 02/14/16; this is a patient I discharged on Friday. That she was going to follow up with the lymphedema clinic in [place]. We did order her juxtalite stockings. Sometime over the weekend the left leg has become much larger and somewhat more painful. She was seen today in the lymphedema clinic and they refused to wrap her leg. There are really still no open areas here 02/18/16: This patient I discharged last week however she came to [place] from the lymphedema clinic in [facility] with uncontrolled edema of the left leg. A duplex ultrasound of the leg was negative for DVT that we arrange and then we rewrapped her leg with a Profore light. We have attempted on at least 2 occasions to get juxtalite stockings in her on however for one reason or another. 02/25/16; patient has no open wounds. She has not yet obtained her juxtalite's although family was present and insisted they've been ordered and paid for READMISSION 07/09/18 This is a now 85 year old woman who comes to clinic accompanied by her nephew. She was last in our clinic in nearly part of 2017. At that time she did not appear to have open wounds on her legs either. There appeared to be a constellation of problems related to obtaining lower extremity stockings, uncontrolled edema etc. She also had a duplex ultrasound that was negative for DVT. We discharged her in juxta light stockings which the patient states she is used for some period of time but more recently is using some form of lower extremity compression stocking. She apparently developed problems late last month and saw her primary doctor Dr. Gildardo Cranker for bilateral cellulitis of her lower extremity. She has since been followed by home health and they've been putting Unna boots on her. She is supposed to go to see the lymphedema clinic in [place]  later in the week and apparently they wanted [place] to look at this to make sure there were no open areas. Also problematic is that you cannot be seen at the lymphedema clinic with home health therefore apparently home health is discharging on Friday i.e. in 3 days' time The patient has a history of lymphedema left greater than right, psychosis NYD, anemia, anxiety, hypertension and venous insufficiency. She has not had any arterial studies READMISSION 10/08/2018 The patient is back in clinic today with again bilateral lower extremity lymphedema. She was here in 2017 for lymphedema and we discharged her with juxta lites. I do not know that she ever really wore these. For the last several months patient has been going to the lymphedema clinic in [place]. They have been wrapping her 3 times a week. Apparently they placed this on Thursday and the patient took it off on Sunday and there were noted to be wounds on her bilateral lower extremities also what looks to be excoriations from scratching although the patient denied it. They went into the clinic on Monday and the occupational therapist there would not replace the wraps because there was open areas. The patient is complaining of itching but no pain. PMhx; Her past medical history is otherwise unchanged she has lymphedema left greater than right, psychosis NYD, anxiety, hypertension and venous insufficiency. ABIs in our clinic were noncompressible  bilaterally 10/25/2018; 2-week follow-up. The patient has no open wounds. Her edema is under good control. She has an appointment early in January with the lymphedema clinic in [place]. Readmission: 08/20/2019 patient is seen today for reevaluation here in our clinic concerning issues she is having with recurrent openings specifically on the right lower extremity she has bilateral lower extremity lymphedema. We did order her juxta light compression last time she was here unfortunately she does not  know where those are at this point she says they were misplaced. With that being said there is no signs of active infection she does have some drainage on the right lower extremity. Fortunately there is no evidence of active infection at this time which is good news she has a little bit of pain at this location where she is draining. No fevers, chills, nausea, vomiting, or diarrhea. Electronic Signature(s) Signed: 08/20/2019 6:13:43 PM By: Worthy Keeler PA-C Entered By: Worthy Keeler on 08/20/2019 16:24:57 -------------------------------------------------------------------------------- Physical Exam Details Patient Name: Date of Service: [name], [name] 08/20/2019 2:45 PM Medical Record [id number] Patient Account Number: [id number] Date of Birth/Sex: Treating RN: 04/26/1932 (85 y.o. [name] Primary Care Provider: Sherrie Mustache Other Clinician: Referring Provider: Treating Provider/Extender:Stone III, Mardi Mainland, Loni Muse in Treatment: 0 Constitutional patient is hypertensive.. pulse regular and within target range for patient.[name] respirations regular, non-labored and within target range for patient.[name] temperature within target range for patient.. Well-nourished and well-hydrated in no acute distress. Eyes conjunctiva clear no eyelid edema noted. pupils equal round and reactive to light and accommodation. Ears, Nose, Mouth, and Throat no gross abnormality of ear auricles or external auditory canals. normal hearing noted during conversation. mucus membranes moist. Respiratory normal breathing without difficulty. clear to auscultation bilaterally. Cardiovascular regular rate and rhythm with normal S1, S2. 1+ dorsalis pedis/posterior tibialis pulses. 2+ pitting edema of the bilateral lower extremities. Gastrointestinal (GI) soft, non-tender, non-distended, +BS. no ventral hernia noted. Musculoskeletal Patient unable to walk without assistance. no  significant deformity or arthritic changes, no loss or range of motion, no clubbing. Psychiatric this patient is able to make decisions and demonstrates good insight into disease process. Alert and Oriented x 3. pleasant and cooperative. Notes Upon evaluation today patient's wound bed again appears to be fairly superficial skin breakdown with regard to her right lower extremity. There is really nothing on the left lower extremity at this time which is good news. Unfortunately her blood pressure is quite elevated today. I do believe she needs to follow-up with her primary care provider regarding this finding. Subsequently with regard to her legs she does have significant edema fortunately she has fairly good pulses as well all things considering. The biggest issue I see is that I do believe we need to get some compression going for her to try to get some of the edema down. Electronic Signature(s) Signed: 08/20/2019 6:13:43 PM By: Worthy Keeler PA-C Entered By: Worthy Keeler on 08/20/2019 16:25:42 -------------------------------------------------------------------------------- Physician Orders Details Patient Name: Date of Service: [name] 08/20/2019 2:45 PM Medical Record [id number] Patient Account Number: [id number] Date of Birth/Sex: Treating RN: 28-Jan-1932 (85 y.o. [name] Primary Care Provider: Sherrie Mustache Other Clinician: Referring Provider: Treating Provider/Extender:Stone III, Mardi Mainland, Loni Muse in Treatment: 0 Verbal / Phone Orders: No Diagnosis Coding ICD-10 Coding Code Description I89.0 Lymphedema, not elsewhere classified L97.812 Non-pressure chronic ulcer of other part of right lower leg with fat layer exposed I50.42 Chronic combined systolic (congestive) and diastolic (congestive) heart failure  Follow-up Appointments Return Appointment in 2 weeks. Nurse Visit: - 1 week for rewrap Dressing Change Frequency Wound #7  Right,Distal,Anterior Lower Leg Do not change entire dressing for one week. Skin Barriers/Peri-Wound Care Moisturizing lotion - both legs Wound Cleansing May shower with protection. Primary Wound Dressing Wound #7 Right,Distal,Anterior Lower Leg Calcium Alginate with Silver - to any weeping areas Secondary Dressing Wound #7 Right,Distal,Anterior Lower Leg ABD pad Edema Control 3 Layer Compression System - Bilateral - unna boot top of calves to help prevent wrap from sliding down Avoid standing for long periods of time Elevate legs to the level of the heart or above for 30 minutes daily and/or when sitting, a frequency of: - throughout the day Exercise regularly Electronic Signature(s) Signed: 08/20/2019 6:13:43 PM By: Worthy Keeler PA-C Signed: 08/20/2019 6:50:57 PM By: Levan Hurst RN, BSN Entered By: Levan Hurst on 08/20/2019 16:25:21 -------------------------------------------------------------------------------- Problem List Details Patient Name: Date of Service: [name] 08/20/2019 2:45 PM Medical Record [id number] Patient Account Number: [id number] Date of Birth/Sex: Treating RN: [birth year] (85 y.o. [name] Primary Care Provider: Sherrie Mustache Other Clinician: Referring Provider: Treating Provider/Extender:Stone III, Mardi Mainland, Loni Muse in Treatment: 0 Active Problems ICD-10 Evaluated Encounter Code Description Active Date Today Diagnosis I89.0 Lymphedema, not elsewhere classified 08/20/2019 No Yes L97.812 Non-pressure chronic ulcer of other part of right lower 08/20/2019 No Yes leg with fat layer exposed I50.42 Chronic combined systolic (congestive) and diastolic [id number] No Yes (congestive) heart failure Inactive Problems Resolved Problems Electronic Signature(s) Signed: 08/20/2019 6:13:43 PM By: Worthy Keeler PA-C Entered By: Worthy Keeler on 08/20/2019  16:16:40 -------------------------------------------------------------------------------- Progress Note Details Patient Name: Date of Service: [name] 08/20/2019 2:45 PM Medical Record [id number] Patient Account Number: [id number] Date of Birth/Sex: Treating RN: 04-01-1932 (85 y.o. [name] Primary Care Provider: Sherrie Mustache Other Clinician: Referring Provider: Treating Provider/Extender:Stone III, Mardi Mainland, Loni Muse in Treatment: 0 Subjective Chief Complaint Information obtained from Patient Bilateral LE Lymphedema with right leg ulcer History of Present Illness (HPI) 11/26/15; the patient arrives today I'm not exactly sure who referred her. She is here for our review of bilateral edema of her left greater than right legs. She asked me right away if we could wrap them. I note that she has a history of chronic stage III renal failure. She is noted to have chronic venous insufficiency and lymphedema. I really don't have much more of history here. Dr. [name] who is her cardiologist notes chronic systolic heart failure, her echocardiogram in January 2015 showed an ejection fraction of 35-40%. I do not see any formal venous or arterial studies. She is not a listed diabetic via her problem list in [place] link. As far as discussing the edema in her legs with the patient she really gives a very circumferential history that is hard to follow. I am not sure that she is really had previous wounds in her legs. There is an asymmetry with the left leg being a lot more edematous than the right I am not sure of the history here either. Almost from her initial intake here. She made a series of paranoid sounding statements that made our intake nurse is concerned that she may be at risk. Most of this directed against the son-in-law. It would be fairly clear looking back through [place] length that she has a history of this and there is a diagnoses of "psychosis  NOS". She is apparently refused psychiatric evaluation. 12/30/15; the patient arrives here having presumably  missed her one-week follow-up appointment today. I'm not sure how things got this far out to. In any case she never received the stockings that we ordered last time. She continues to have a significant lipid lymphedema of her left leg down to her foot. I try to go over the history of this with her, it is clear that there is some chronicity to this problem but I'm not really sure how long she has had this it is at least several years as she states her husband died 2 weeks ago and that point the edema in the left leg was a lot more than now 02/11/16; the patient arrives accompanied by her daughter who apparently has been staying with her but who his primary residence is in [place]. I'm not completely certain why she didn't return here. We had ordered her Juzo stockings and her last visit in early March patient arrives saying that the company called and they are no longer making these. She is apparently being seen Monday Wednesday and Friday at the lymphedema clinic in [place]. The daughter states that she is being taught to do the lymphedema wraps however makes it [place] aware that she is returning to [place] on Friday. Apparently a nephew will be taking her to the lymphedema clinic, but nobody seems to think that the patient can actually do this. In the meantime she appears to have no open wounds on her bilateral lower legs with careful inspection 02/14/16; this is a patient I discharged on Friday. That she was going to follow up with the lymphedema clinic in [place]. We did order her juxtalite stockings. Sometime over the weekend the left leg has become much larger and somewhat more painful. She was seen today in the lymphedema clinic and they refused to wrap her leg. There are really still no open areas here 02/18/16: This patient I discharged last week however she came to [place]  from the lymphedema clinic in [facility] with uncontrolled edema of the left leg. A duplex ultrasound of the leg was negative for DVT that we arrange and then we rewrapped her leg with a Profore light. We have attempted on at least 2 occasions to get juxtalite stockings in her on however for one reason or another. 02/25/16; patient has no open wounds. She has not yet obtained her juxtalite's although family was present and insisted they've been ordered and paid for READMISSION 07/09/18 This is a now 85 year old woman who comes to clinic accompanied by her nephew. She was last in our clinic in nearly part of 2017. At that time she did not appear to have open wounds on her legs either. There appeared to be a constellation of problems related to obtaining lower extremity stockings, uncontrolled edema etc. She also had a duplex ultrasound that was negative for DVT. We discharged her in juxta light stockings which the patient states she is used for some period of time but more recently is using some form of lower extremity compression stocking. She apparently developed problems late last month and saw her primary doctor Dr. Gildardo Cranker for bilateral cellulitis of her lower extremity. She has since been followed by home health and they've been putting Unna boots on her. She is supposed to go to see the lymphedema clinic in [place] later in the week and apparently they wanted [place] to look at this to make sure there were no open areas. Also problematic is that you cannot be seen at the lymphedema clinic with home health therefore  apparently home health is discharging on Friday i.e. in 3 days' time The patient has a history of lymphedema left greater than right, psychosis NYD, anemia, anxiety, hypertension and venous insufficiency. She has not had any arterial studies READMISSION 10/08/2018 The patient is back in clinic today with again bilateral lower extremity lymphedema. She was here in 2017  for lymphedema and we discharged her with juxta lites. I do not know that she ever really wore these. For the last several months patient has been going to the lymphedema clinic in [place]. They have been wrapping her 3 times a week. Apparently they placed this on Thursday and the patient took it off on Sunday and there were noted to be wounds on her bilateral lower extremities also what looks to be excoriations from scratching although the patient denied it. They went into the clinic on Monday and the occupational therapist there would not replace the wraps because there was open areas. The patient is complaining of itching but no pain. PMhx; Her past medical history is otherwise unchanged she has lymphedema left greater than right, psychosis NYD, anxiety, hypertension and venous insufficiency. ABIs in our clinic were noncompressible bilaterally 10/25/2018; 2-week follow-up. The patient has no open wounds. Her edema is under good control. She has an appointment early in January with the lymphedema clinic in [place]. Readmission: 08/20/2019 patient is seen today for reevaluation here in our clinic concerning issues she is having with recurrent openings specifically on the right lower extremity she has bilateral lower extremity lymphedema. We did order her juxta light compression last time she was here unfortunately she does not know where those are at this point she says they were misplaced. With that being said there is no signs of active infection she does have some drainage on the right lower extremity. Fortunately there is no evidence of active infection at this time which is good news she has a little bit of pain at this location where she is draining. No fevers, chills, nausea, vomiting, or diarrhea. Patient History Information obtained from Patient. Allergies aspirin (Severity: Moderate, Reaction: upset stomach), codeine (Severity: Moderate, Reaction: "makes by body go crazy;  cramps"), latex (Severity: Moderate, Reaction: nausea and vomiting) Family History Diabetes - Mother,Paternal Grandparents, No family history of Cancer, Heart Disease, Hereditary Spherocytosis, Hypertension, Kidney Disease, Lung Disease, Seizures, Stroke, Thyroid Problems, Tuberculosis. Social History Never smoker, Marital Status - Widowed, Alcohol Use - Never, Drug Use - No History, Caffeine Use - Daily - coffee. Medical History Eyes Denies history of Cataracts, Glaucoma, Optic Neuritis Ear/Nose/Mouth/Throat Denies history of Chronic sinus problems/congestion, Middle ear problems Hematologic/Lymphatic Patient has history of Anemia, Lymphedema Denies history of Hemophilia, Human Immunodeficiency Virus, Sickle Cell Disease Respiratory Patient has history of Asthma, Chronic Obstructive Pulmonary Disease (COPD) Denies history of Aspiration, Pneumothorax, Sleep Apnea, Tuberculosis Cardiovascular Patient has history of Congestive Heart Failure - systolic heart failure, Hypertension Denies history of Angina, Arrhythmia, Hypotension, Myocardial Infarction, Peripheral Arterial Disease, Peripheral Venous Disease, Phlebitis, Vasculitis Gastrointestinal Denies history of Cirrhosis , Colitis, Crohnoos, Hepatitis A, Hepatitis B, Hepatitis C Endocrine Patient has history of Type II Diabetes Denies history of Type I Diabetes Genitourinary Denies history of End Stage Renal Disease Immunological Denies history of Lupus Erythematosus, Raynaudoos, Scleroderma Integumentary (Skin) Denies history of History of Burn Musculoskeletal Denies history of Gout, Rheumatoid Arthritis, Osteoarthritis, Osteomyelitis Neurologic Denies history of Dementia, Neuropathy, Quadriplegia, Paraplegia, Seizure Disorder Oncologic Denies history of Received Chemotherapy, Received Radiation Psychiatric Denies history of Anorexia/bulimia, Confinement Anxiety Patient is treated with  Controlled  Diet. Hospitalization/Surgery History - cholecystectomy. - tonsillectomy. - cervical spine surgery x2. - partial hysterectomy. - lumbar disc surgery. - esophogastroduodenoscopy. - flexible sigmoidoscopy. Medical And Surgical History Notes Cardiovascular unspecified venous insufficiency Gastrointestinal GERD Endocrine Borderline diabetic Musculoskeletal arthritis Psychiatric unspecified nonpsychotic mental disorder Review of Systems (ROS) Constitutional Symptoms (General Health) Denies complaints or symptoms of Fatigue, Fever, Chills, Marked Weight Change. Eyes Denies complaints or symptoms of Dry Eyes, Vision Changes, Glasses / Contacts. Ear/Nose/Mouth/Throat Denies complaints or symptoms of Chronic sinus problems or rhinitis. Respiratory Denies complaints or symptoms of Chronic or frequent coughs, Shortness of Breath. Cardiovascular Denies complaints or symptoms of Chest pain. Gastrointestinal Denies complaints or symptoms of Frequent diarrhea, Nausea, Vomiting. Endocrine Denies complaints or symptoms of Heat/cold intolerance. Genitourinary Denies complaints or symptoms of Frequent urination. Integumentary (Skin) Complains or has symptoms of Wounds. Musculoskeletal Denies complaints or symptoms of Muscle Pain, Muscle Weakness. Neurologic Denies complaints or symptoms of Numbness/parasthesias. Psychiatric Denies complaints or symptoms of Claustrophobia, Suicidal. Objective Constitutional patient is hypertensive.. pulse regular and within target range for patient.[name] respirations regular, non-labored and within target range for patient.[name] temperature within target range for patient.. Well-nourished and well-hydrated in no acute distress. Vitals Time Taken: 5:46 AM, Height: 58 in, Source: Stated, Weight: 135 lbs, Source: Stated, BMI: 28.2, Temperature: 99 F, Pulse: 85 bpm, Respiratory Rate: 18 breaths/min, Blood Pressure: 173/90 mmHg. Eyes conjunctiva clear no eyelid edema  noted. pupils equal round and reactive to light and accommodation. Ears, Nose, Mouth, and Throat no gross abnormality of ear auricles or external auditory canals. normal hearing noted during conversation. mucus membranes moist. Respiratory normal breathing without difficulty. clear to auscultation bilaterally. Cardiovascular regular rate and rhythm with normal S1, S2. 1+ dorsalis pedis/posterior tibialis pulses. 2+ pitting edema of the bilateral lower extremities. Gastrointestinal (GI) soft, non-tender, non-distended, +BS. no ventral hernia noted. Musculoskeletal Patient unable to walk without assistance. no significant deformity or arthritic changes, no loss or range of motion, no clubbing. Psychiatric this patient is able to make decisions and demonstrates good insight into disease process. Alert and Oriented x 3. pleasant and cooperative. General Notes: Upon evaluation today patient's wound bed again appears to be fairly superficial skin breakdown with regard to her right lower extremity. There is really nothing on the left lower extremity at this time which is good news. Unfortunately her blood pressure is quite elevated today. I do believe she needs to follow-up with her primary care provider regarding this finding. Subsequently with regard to her legs she does have significant edema fortunately she has fairly good pulses as well all things considering. The biggest issue I see is that I do believe we need to get some compression going for her to try to get some of the edema down. Integumentary (Hair, Skin) Wound #7 status is Open. Original cause of wound was Blister. The wound is located on the Right,Distal,Anterior Lower Leg. The wound measures 8cm length x 5cm width x 0.1cm depth; 31.416cm^2 area and 3.142cm^3 volume. There is Fat Layer (Subcutaneous Tissue) Exposed exposed. There is no tunneling or undermining noted. There is a medium amount of serosanguineous drainage noted. There  is medium (34-66%) pink granulation within the wound bed. There is a medium (34-66%) amount of necrotic tissue within the wound bed including Adherent Slough. 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 There was a Three Layer Compression Therapy Procedure by Levan Hurst, RN. Post procedure Diagnosis Wound #7: Same as Pre-Procedure There was a Three Layer Compression Therapy Procedure by Levan Hurst, RN. Post procedure Diagnosis Wound #: Same as Pre-Procedure Plan Follow-up Appointments: Return Appointment in 2 weeks. Nurse Visit: - 1 week for rewrap Dressing Change Frequency: Wound #7 Right,Distal,Anterior Lower Leg: Do not change entire dressing for one week. Skin Barriers/Peri-Wound Care: Moisturizing lotion - both legs Wound Cleansing: May shower with protection. Primary Wound Dressing: Wound #7 Right,Distal,Anterior Lower Leg: Calcium Alginate with Silver - to any weeping areas Secondary Dressing: Wound #7 Right,Distal,Anterior Lower Leg: ABD pad Edema Control: 3 Layer Compression System - Bilateral - unna boot top of calves to help prevent wrap from sliding down Avoid standing for long periods of time Elevate legs to the level of the heart or above for 30 minutes daily and/or when sitting, a frequency of: - throughout the day Exercise regularly 1 at this point I would recommend a bilateral 3 layer compression wrap she is tolerated this before when she was seen here in the clinic. This will suffice until we can reorder her Velcro compression wraps. 2. We will go ahead and use a silver alginate dressing for the right lower extremity in the location where this is  open and draining. 3. We will also go ahead and consider and recommend that she continue to elevate her legs as much as possible as far as trying to keep edema under control. We will order her Velcro compression wraps but will wait till next week at her nurse visit that way we can get a better idea where her legs should be edema wise as there can be much smaller than what they are this week. We will see patient back for reevaluation in 1 week here in the clinic. If anything worsens or changes patient will contact our office for additional recommendations. Electronic Signature(s) Signed: 08/20/2019 6:13:43 PM By: Worthy Keeler PA-C Signed: 08/20/2019 6:50:57 PM By: Levan Hurst RN, BSN Entered By: Levan Hurst on 08/20/2019 16:37:39 -------------------------------------------------------------------------------- HxROS Details Patient Name: Date of Service: [name] 08/20/2019 2:45 PM Medical Record [id number] Patient Account Number: [id number] Date of Birth/Sex: Treating RN: 28-Aug-1932 (85 y.o. Orvan Falconer Primary Care Provider: Sherrie Mustache Other Clinician: Referring Provider: Treating Provider/Extender:Stone III, Mardi Mainland, Loni Muse in Treatment: 0 Label Progress Note Print Version as History and Physical for this encounter Information Obtained From Patient Constitutional Symptoms (General Health) Complaints and Symptoms: Negative for: Fatigue; Fever; Chills; Marked Weight Change Eyes Complaints and Symptoms: Negative for: Dry Eyes; Vision Changes; Glasses / Contacts Medical History: Negative for: Cataracts; Glaucoma; Optic Neuritis Ear/Nose/Mouth/Throat Complaints and Symptoms: Negative for: Chronic sinus problems or rhinitis Medical History: Negative for: Chronic sinus problems/congestion; Middle ear problems Respiratory Complaints and Symptoms: Negative for: Chronic or frequent coughs; Shortness of Breath Medical History: Positive for:  Asthma; Chronic Obstructive Pulmonary Disease (COPD) Negative for: Aspiration; Pneumothorax; Sleep Apnea; Tuberculosis Cardiovascular Complaints and Symptoms: Negative for: Chest pain Medical History: Positive for: Congestive Heart Failure - systolic heart failure; Hypertension Negative for: Angina; Arrhythmia; Hypotension; Myocardial Infarction; Peripheral Arterial Disease; Peripheral Venous Disease; Phlebitis; Vasculitis Past Medical History Notes: unspecified venous insufficiency Gastrointestinal Complaints and Symptoms: Negative for: Frequent diarrhea; Nausea; Vomiting Medical History: Negative for: Cirrhosis ; Colitis; Crohns; Hepatitis A; Hepatitis B; Hepatitis C Past Medical History Notes: GERD Endocrine Complaints and Symptoms: Negative for:  Heat/cold intolerance Medical History: Positive for: Type II Diabetes Negative for: Type I Diabetes Past Medical History Notes: Borderline diabetic Treated with: Diet Genitourinary Complaints and Symptoms: Negative for: Frequent urination Medical History: Negative for: End Stage Renal Disease Integumentary (Skin) Complaints and Symptoms: Positive for: Wounds Medical History: Negative for: History of Burn Musculoskeletal Complaints and Symptoms: Negative for: Muscle Pain; Muscle Weakness Medical History: Negative for: Gout; Rheumatoid Arthritis; Osteoarthritis; Osteomyelitis Past Medical History Notes: arthritis Neurologic Complaints and Symptoms: Negative for: Numbness/parasthesias Medical History: Negative for: Dementia; Neuropathy; Quadriplegia; Paraplegia; Seizure Disorder Psychiatric Complaints and Symptoms: Negative for: Claustrophobia; Suicidal Medical History: Negative for: Anorexia/bulimia; Confinement Anxiety Past Medical History Notes: unspecified nonpsychotic mental disorder Hematologic/Lymphatic Medical History: Positive for: Anemia; Lymphedema Negative for: Hemophilia; Human Immunodeficiency Virus;  Sickle Cell Disease Immunological Medical History: Negative for: Lupus Erythematosus; Raynauds; Scleroderma Oncologic Medical History: Negative for: Received Chemotherapy; Received Radiation Immunizations Pneumococcal Vaccine: Received Pneumococcal Vaccination: Yes Immunization Notes: Pt. unsure of last tetanus shot. Implantable Devices None Hospitalization / Surgery History Type of Hospitalization/Surgery cholecystectomy tonsillectomy cervical spine surgery x2 partial hysterectomy lumbar disc surgery esophogastroduodenoscopy flexible sigmoidoscopy Family and Social History Cancer: No; Diabetes: Yes - Mother,Paternal Grandparents; Heart Disease: No; Hereditary Spherocytosis: No; Hypertension: No; Kidney Disease: No; Lung Disease: No; Seizures: No; Stroke: No; Thyroid Problems: No; Tuberculosis: No; Never smoker; Marital Status - Widowed; Alcohol Use: Never; Drug Use: No History; Caffeine Use: Daily - coffee; Financial Concerns: No; Food, Clothing or Shelter Needs: No; Support System Lacking: No; Transportation Concerns: No Electronic Signature(s) Signed: 08/20/2019 6:13:43 PM By: Worthy Keeler PA-C Signed: 10/07/2019 3:01:15 PM By: Carlene Coria RN Entered By: Carlene Coria on 08/20/2019 15:50:42 -------------------------------------------------------------------------------- SuperBill Details Patient Name: Date of Service: [name] 08/20/2019 Medical Record [id number] Patient Account Number: [id number] Date of Birth/Sex: Treating RN: 14-Dec-1931 (85 y.o. [name] Primary Care Provider: Sherrie Mustache Other Clinician: Referring Provider: Treating Provider/Extender:Stone III, Mardi Mainland, Loni Muse in Treatment: 0 Diagnosis Coding ICD-10 Codes Code Description I89.0 Lymphedema, not elsewhere classified L97.812 Non-pressure chronic ulcer of other part of right lower leg with fat layer exposed I50.42 Chronic combined systolic (congestive)  and diastolic (congestive) heart failure Facility Procedures CPT4: Code [id number] Description: 213 - WOUND CARE VISIT-LEV 3 EST PT Modifier Quantity: 25 1 CPT4: [id number] (b Description: [id number] BILATERAL: Application of multi-layer venous compression system; leg elow knee), including ankle and foot. Modifier Quantity: 1 Physician Procedures CPT4 Code Description: [id number] - WC PHYS LEVEL 4 - EST PT ICD-10 Diagnosis Description I89.0 Lymphedema, not elsewhere classified [id number] Non-pressure chronic ulcer of other part of right lower I50.42 Chronic combined systolic (congestive) and  diastolic (co Modifier: leg with fat la ngestive) heart Quantity: 1 yer exposed failure Electronic Signature(s) Signed: 08/20/2019 6:50:57 PM By: Levan Hurst RN, BSN Signed: 09/03/2019 6:48:11 PM By: Worthy Keeler PA-C Previous Signature: 08/20/2019 6:13:43 PM Version By: Worthy Keeler PA-C Entered By: Levan Hurst on 08/20/2019 18:47:20

## 2019-10-08 NOTE — Progress Notes (Signed)
[name], [name] ([id number]) Visit Report for 09/03/2019 Arrival Information Details Patient Name: Date of Service: [name], [name] 09/03/2019 3:00 PM Medical Record [id number] Patient Account Number: [id number] Date of Birth/Sex: Treating RN: 1932-08-25 (85 y.o. Orvan Falconer Primary Care Charles Niese: Sherrie Mustache Other Clinician: Referring Dylana Shaw: Treating Jamarr Treinen/Extender:Stone III, Mardi Mainland, Loni Muse in Treatment: 2 Visit Information History Since Last Visit All ordered tests and consults were completed: No Patient Arrived: Ambulatory Added or deleted any medications: No Arrival Time: 15:18 Any new allergies or adverse reactions: No Accompanied By: nephew Had a fall or experienced change in No Transfer Assistance: None activities of daily living that may affect Patient Identification Verified: Yes risk of falls: Secondary Verification Process Yes Signs or symptoms of abuse/neglect since last No Completed: visito Patient Requires Transmission- No Hospitalized since last visit: No Based Precautions: Implantable device outside of the clinic excluding No Patient Has Alerts: Yes cellular tissue based products placed in the center Patient Alerts: right non since last visit: compressable Has Dressing in Place as Prescribed: Yes Pain Present Now: No Electronic Signature(s) Signed: 10/08/2019 12:05:18 PM By: Carlene Coria RN Entered By: Carlene Coria on 09/03/2019 15:26:13 -------------------------------------------------------------------------------- Compression Therapy Details Patient Name: Date of Service: [name] 09/03/2019 3:00 PM Medical Record [id number] Patient Account Number: [id number] Date of Birth/Sex: Treating RN: [birth year] (85 y.o. Elam Dutch Primary Care Marcellina Jonsson: Sherrie Mustache Other Clinician: Referring Sanchez Hemmer: Treating Emili Mcloughlin/Extender:Stone III, Mardi Mainland, Loni Muse in Treatment:  2 Compression Therapy Performed for Wound Wound #7 Right,Distal,Anterior Lower Leg Assessment: Performed By: Clinician Deon Pilling, RN Compression Type: Three Layer Post Procedure Diagnosis Same as Pre-procedure Electronic Signature(s) Signed: 09/03/2019 6:42:58 PM By: Baruch Gouty RN, BSN Entered By: Baruch Gouty on 09/03/2019 17:16:31 -------------------------------------------------------------------------------- Compression Therapy Details Patient Name: Date of Service: [name] 09/03/2019 3:00 PM Medical Record [id number] Patient Account Number: [id number] Date of Birth/Sex: Treating RN: September 02, 1932 (85 y.o. Elam Dutch Primary Care Decoda Van: Sherrie Mustache Other Clinician: Referring Elanie Hammitt: Treating Sandy Haye/Extender:Stone III, Mardi Mainland, Loni Muse in Treatment: 2 Compression Therapy Performed for Wound Wound #8 Left,Anterior Lower Leg Assessment: Performed By: Clinician Deon Pilling, RN Compression Type: Three Layer Post Procedure Diagnosis Same as Pre-procedure Electronic Signature(s) Signed: 09/03/2019 6:42:58 PM By: Baruch Gouty RN, BSN Entered By: Baruch Gouty on 09/03/2019 17:16:31 -------------------------------------------------------------------------------- Encounter Discharge Information Details Patient Name: Date of Service: [name] 09/03/2019 3:00 PM Medical Record [id number] Patient Account Number: [id number] Date of Birth/Sex: Treating RN: 08-14-1932 (85 y.o. Debby Bud Primary Care Barba Solt: Sherrie Mustache Other Clinician: Referring Halvor Behrend: Treating Jozi Malachi/Extender:Stone III, Mardi Mainland, Loni Muse in Treatment: 2 Encounter Discharge Information Items Discharge Condition: Stable Ambulatory Status: Cane Discharge Destination: Home Transportation: Private Auto Accompanied By: nephew Schedule Follow-up Appointment: Yes Clinical Summary of Care: Electronic  Signature(s) Signed: 09/03/2019 6:30:51 PM By: Deon Pilling Entered By: Deon Pilling on 09/03/2019 17:40:56 -------------------------------------------------------------------------------- Lower Extremity Assessment Details Patient Name: Date of Service: [name], [name] 09/03/2019 3:00 PM Medical Record [id number] Patient Account Number: [id number] Date of Birth/Sex: Treating RN: 08-Oct-1932 (85 y.o. Orvan Falconer Primary Care Samah Lapiana: Sherrie Mustache Other Clinician: Referring Shiquita Collignon: Treating Ngina Royer/Extender:Stone III, Mardi Mainland, Loni Muse in Treatment: 2 Edema Assessment Assessed: [Left: No] [Right: No] Edema: [Left: Ye] [Right: s] Calf Left: Right: Point of Measurement: 41 cm From Medial Instep cm 38.5 cm Ankle Left: Right: Point of Measurement: 12 cm From Medial Instep cm 30 cm Electronic Signature(s) Signed: 10/08/2019 12:05:18 PM By: Carlene Coria RN Entered By: Carlene Coria  on 09/03/2019 15:27:17 -------------------------------------------------------------------------------- [place] Details Patient Name: Date of Service: [name], [name] 09/03/2019 3:00 PM Medical Record [id number] Patient Account Number: [id number] Date of Birth/Sex: Treating RN: Aug 23, 1932 (85 y.o. Elam Dutch Primary Care Helvi Royals: Sherrie Mustache Other Clinician: Referring Brookes Craine: Treating Trenten Watchman/Extender:Stone III, Mardi Mainland, Loni Muse in Treatment: 2 Active Inactive Venous Leg Ulcer Nursing Diagnoses: Actual venous Insuffiency (use after diagnosis is confirmed) Knowledge deficit related to disease process and management Goals: Patient will maintain optimal edema control Date Initiated: 08/20/2019 Target Resolution Date: 09/19/2019 Goal Status: Active Patient/caregiver will verbalize understanding of disease process and disease management Date Initiated: 08/20/2019 Target Resolution Date: 09/19/2019 Goal Status:  Active Interventions: Assess peripheral edema status every visit. Compression as ordered Provide education on venous insufficiency Notes: Wound/Skin Impairment Nursing Diagnoses: Impaired tissue integrity Knowledge deficit related to ulceration/compromised skin integrity Goals: Patient/caregiver will verbalize understanding of skin care regimen Date Initiated: 08/20/2019 Target Resolution Date: 09/19/2019 Goal Status: Active Interventions: Assess patient/caregiver ability to obtain necessary supplies Assess patient/caregiver ability to perform ulcer/skin care regimen upon admission and as needed Assess ulceration(s) every visit Provide education on ulcer and skin care Notes: Electronic Signature(s) Signed: 09/03/2019 6:42:58 PM By: Baruch Gouty RN, BSN Entered By: Baruch Gouty on 09/03/2019 17:06:57 -------------------------------------------------------------------------------- Pain Assessment Details Patient Name: Date of Service: [name] 09/03/2019 3:00 PM Medical Record [id number] Patient Account Number: [id number] Date of Birth/Sex: Treating RN: 14-Feb-1932 (85 y.o. Orvan Falconer Primary Care Iyahna Obriant: Sherrie Mustache Other Clinician: Referring Julea Hutto: Treating Lisabeth Mian/Extender:Stone III, Mardi Mainland, Loni Muse in Treatment: 2 Active Problems Location of Pain Severity and Description of Pain Patient Has Paino No Site Locations Pain Management and Medication Current Pain Management: Electronic Signature(s) Signed: 10/08/2019 12:05:18 PM By: Carlene Coria RN Entered By: Carlene Coria on 09/03/2019 15:20:16 -------------------------------------------------------------------------------- Patient/Caregiver Education Details Patient Name: Date of Service: [name] 11/4/2020andnbsp3:00 PM Medical Record [id number] Patient Account Number: [id number] Date of Birth/Gender: Treating RN: [birth year] (85 y.o. Elam Dutch Primary Care Physician: Sherrie Mustache Other Clinician: Referring Physician: Treating Physician/Extender:Stone III, Mardi Mainland, Loni Muse in Treatment: 2 Education Assessment Education Provided To: Patient Education Topics Provided Venous: Methods: Explain/Verbal Responses: Reinforcements needed, State content correctly Wound/Skin Impairment: Methods: Explain/Verbal Responses: Reinforcements needed, State content correctly Electronic Signature(s) Signed: 09/03/2019 6:42:58 PM By: Baruch Gouty RN, BSN Entered By: Baruch Gouty on 09/03/2019 17:07:16 -------------------------------------------------------------------------------- Wound Assessment Details Patient Name: Date of Service: [name] 09/03/2019 3:00 PM Medical Record [id number] Patient Account Number: [id number] Date of Birth/Sex: Treating RN: 09/08/1932 (85 y.o. Orvan Falconer Primary Care Jahid Weida: Sherrie Mustache Other Clinician: Referring Bianey Tesoro: Treating Alexza Norbeck/Extender:Stone III, Mardi Mainland, Loni Muse in Treatment: 2 Wound Status Wound Number: 7 Primary Lymphedema Etiology: Wound Location: Right Lower Leg - Anterior, Distal Wound Open Wounding Event: Blister Status: Date Acquired: 07/31/2019 Comorbid Anemia, Lymphedema, Asthma, Chronic Weeks Of Treatment: 2 History: Obstructive Pulmonary Disease (COPD), Clustered Wound: No Congestive Heart Failure, Hypertension, Type II Diabetes Photos Wound Measurements Length: (cm) 7 % Reduct Width: (cm) 4.5 % Reduct Depth: (cm) 0.1 Epitheli Area: (cm) 24.74 Tunneli Volume: (cm) 2.474 Undermi Wound Description Classification: Full Thickness Without Exposed Support Foul Od Structures Slough/ Exudate Medium Amount: Exudate Serosanguineous Type: Exudate red, brown Color: Wound Bed Granulation Amount: None Present (0%) Necrotic Amount: Large (67-100%) Fascia Necrotic Quality: Adherent Slough Fat Layer ( Tendon  Expo Muscle Expo Joint Expos Bone Expose or After Cleansing: No Fibrino Yes Exposed Structure Exposed: No Subcutaneous Tissue) Exposed: No sed: No sed:  No ed: No d: No ion in Area: 21.3% ion in Volume: 21.3% alization: None ng: No ning: No Electronic Signature(s) Signed: 09/04/2019 4:13:56 PM By: Mikeal Hawthorne EMT/HBOT Signed: 10/08/2019 12:05:18 PM By: Carlene Coria RN Entered By: Mikeal Hawthorne on 09/04/2019 11:08:55 -------------------------------------------------------------------------------- Wound Assessment Details Patient Name: Date of Service: [name] 09/03/2019 3:00 PM Medical Record [id number] Patient Account Number: [id number] Date of Birth/Sex: Treating RN: August 11, 1932 (85 y.o. Elam Dutch Primary Care Brucha Ahlquist: Sherrie Mustache Other Clinician: Referring Layia Walla: Treating Valary Manahan/Extender:Stone III, Mardi Mainland, Loni Muse in Treatment: 2 Wound Status Wound Number: 8 Primary Lymphedema Etiology: Wound Location: Left Lower Leg - Anterior Wound Open Wounding Event: Gradually Appeared Status: Date Acquired: 09/03/2019 Comorbid Anemia, Lymphedema, Asthma, Chronic Weeks Of Treatment: 0 History: Obstructive Pulmonary Disease (COPD), Clustered Wound: No Congestive Heart Failure, Hypertension, Type II Diabetes Photos Wound Measurements Length: (cm) 1 Width: (cm) 0.5 Depth: (cm) 0.1 Area: (cm) 0.393 Volume: (cm) 0.039 Wound Description Classification: Partial Thickness Wound Margin: Flat and Intact Exudate Amount: Small Exudate Type: Serous Exudate Color: amber Wound Bed Granulation Amount: Large (67-100%) Granulation Quality: Pink Necrotic Amount: None Present (0%) After Cleansing: No rino No Exposed Structure osed: No (Subcutaneous Tissue) Exposed: No osed: No osed: No sed: No ed: No Skin Breakdown % Reduction in Area: 0% % Reduction in Volume: 0% Epithelialization: Medium (34-66%) Tunneling:  No Undermining: No Foul Odor Slough/Fib Fascia Exp Fat Layer Tendon Exp Muscle Exp Joint Expo Bone Expos Limited to Electronic Signature(s) Signed: 09/05/2019 6:12:06 PM By: Baruch Gouty RN, BSN Signed: 09/08/2019 4:06:15 PM By: Mikeal Hawthorne EMT/HBOT Previous Signature: 09/03/2019 6:42:58 PM Version By: Baruch Gouty RN, BSN Entered By: Mikeal Hawthorne on 09/05/2019 11:43:18 -------------------------------------------------------------------------------- Vitals Details Patient Name: Date of Service: [name] 09/03/2019 3:00 PM Medical Record [id number] Patient Account Number: [id number] Date of Birth/Sex: Treating RN: 1932-08-06 (85 y.o. Orvan Falconer Primary Care Frederica Chrestman: Sherrie Mustache Other Clinician: Referring Yelina Sarratt: Treating Jadian Karman/Extender:Stone III, Mardi Mainland, Loni Muse in Treatment: 2 Vital Signs Time Taken: 15:19 Temperature (F): 98.7 Height (in): 58 Pulse (bpm): 88 Weight (lbs): 135 Respiratory Rate (breaths/min): 18 Body Mass Index (BMI): 28.2 Blood Pressure (mmHg): 180/90 Reference Range: 80 - 120 mg / dl Electronic Signature(s) Signed: 10/08/2019 12:05:18 PM By: Carlene Coria RN Entered By: Carlene Coria on 09/03/2019 15:20:05

## 2019-10-16 ENCOUNTER — Ambulatory Visit: Payer: Medicare Other | Admitting: Nurse Practitioner

## 2019-10-17 ENCOUNTER — Ambulatory Visit (INDEPENDENT_AMBULATORY_CARE_PROVIDER_SITE_OTHER): Payer: Medicare Other | Admitting: Nurse Practitioner

## 2019-10-17 ENCOUNTER — Other Ambulatory Visit: Payer: Self-pay

## 2019-10-17 ENCOUNTER — Encounter: Payer: Self-pay | Admitting: Nurse Practitioner

## 2019-10-17 VITALS — BP 144/88 | HR 76 | Temp 97.7°F | Ht <= 58 in | Wt 139.6 lb

## 2019-10-17 DIAGNOSIS — M810 Age-related osteoporosis without current pathological fracture: Secondary | ICD-10-CM

## 2019-10-17 DIAGNOSIS — R419 Unspecified symptoms and signs involving cognitive functions and awareness: Secondary | ICD-10-CM

## 2019-10-17 DIAGNOSIS — F22 Delusional disorders: Secondary | ICD-10-CM | POA: Diagnosis not present

## 2019-10-17 DIAGNOSIS — D509 Iron deficiency anemia, unspecified: Secondary | ICD-10-CM

## 2019-10-17 DIAGNOSIS — I10 Essential (primary) hypertension: Secondary | ICD-10-CM | POA: Diagnosis not present

## 2019-10-17 DIAGNOSIS — E559 Vitamin D deficiency, unspecified: Secondary | ICD-10-CM

## 2019-10-17 DIAGNOSIS — D472 Monoclonal gammopathy: Secondary | ICD-10-CM | POA: Diagnosis not present

## 2019-10-17 DIAGNOSIS — I890 Lymphedema, not elsewhere classified: Secondary | ICD-10-CM

## 2019-10-17 DIAGNOSIS — Z9114 Patient's other noncompliance with medication regimen: Secondary | ICD-10-CM

## 2019-10-17 DIAGNOSIS — Z91148 Patient's other noncompliance with medication regimen for other reason: Secondary | ICD-10-CM

## 2019-10-17 DIAGNOSIS — J449 Chronic obstructive pulmonary disease, unspecified: Secondary | ICD-10-CM

## 2019-10-17 DIAGNOSIS — N1832 Chronic kidney disease, stage 3b: Secondary | ICD-10-CM

## 2019-10-17 DIAGNOSIS — I5022 Chronic systolic (congestive) heart failure: Secondary | ICD-10-CM

## 2019-10-17 DIAGNOSIS — D128 Benign neoplasm of rectum: Secondary | ICD-10-CM

## 2019-10-17 DIAGNOSIS — J4489 Other specified chronic obstructive pulmonary disease: Secondary | ICD-10-CM

## 2019-10-17 MED ORDER — CARVEDILOL 6.25 MG PO TABS: ORAL_TABLET | ORAL | 1 refills | Status: DC

## 2019-10-17 MED ORDER — TORSEMIDE 20 MG PO TABS: 40.0000 mg | ORAL_TABLET | Freq: Two times a day (BID) | ORAL | 1 refills | Status: DC

## 2019-10-17 MED ORDER — DENOSUMAB 60 MG/ML ~~LOC~~ SOSY
60.0000 mg | PREFILLED_SYRINGE | Freq: Once | SUBCUTANEOUS | Status: AC
  Administered 2019-10-17: 60 mg via SUBCUTANEOUS

## 2019-10-17 MED ORDER — POTASSIUM CHLORIDE CRYS ER 20 MEQ PO TBCR: 40.0000 meq | EXTENDED_RELEASE_TABLET | Freq: Two times a day (BID) | ORAL | 3 refills | Status: DC

## 2019-10-17 MED ORDER — VITAMIN D (ERGOCALCIFEROL) 1.25 MG (50000 UNIT) PO CAPS: 50000.0000 [IU] | ORAL_CAPSULE | ORAL | 1 refills | Status: DC

## 2019-10-17 MED ORDER — HYDRALAZINE HCL 25 MG PO TABS: ORAL_TABLET | ORAL | 1 refills | Status: DC

## 2019-10-17 MED ORDER — OLANZAPINE 5 MG PO TABS: 5.0000 mg | ORAL_TABLET | Freq: Every day | ORAL | 1 refills | Status: DC

## 2019-10-17 MED ORDER — FERROUS SULFATE DRIED 200 (65 FE) MG PO TABS: 1.0000 | ORAL_TABLET | Freq: Every day | ORAL | 1 refills | Status: DC

## 2019-10-17 NOTE — Patient Instructions (Addendum)
To call hematologist and make appt for her to be seen [phone]    Call about scheduling GI followup Inda Castle, MD- gastroenterologist  Address: [address], [place], [place] [address] Phone: [phone]  Medications refilled and sent to the pharmacy- [facility]

## 2019-10-17 NOTE — Progress Notes (Signed)
Careteam: Patient Care Team: Lauree Chandler, NP as PCP - General (Geriatric Medicine) [name], MD (Pulmonary Disease) Jacolyn Reedy, MD as Consulting Physician (Cardiology)  Advanced Directive information    Allergies  Allergen Reactions  . Aspirin Other (See Comments)    REACTION: upset stomach  . Codeine Other (See Comments)    REACTION: "MAKES MY BODY GO CRAZY; CRAMPS"  . Latex Nausea And Vomiting    Chief Complaint  Patient presents with  . Medical Management of Chronic Issues    4 month follow-up  . Injections    Prolia Injection   . Quality Metric Gaps    Discuss colonoscopy recommendation      HPI: Patient is a 85 y.o. female seen in the office today for routine follow up. Pt with hx of chf, htn, copd, ckd, MGUS, anemia.   htn-slightly elevated today. Does not add salt to her food.   MGUS- referral to hematology but has not made appt. Number given   Was referred to podiatrist due to overgrown toenails. Going back on January 15th for management with this.   No ongoing rash  No ongoing wounds  Osteoporosis- prolia given today. Continues on cal and vit d   Anxiety- this week was traumatic- major death in the family. Funeral was Wednesday.  on olanzapine 5 mg but only taking  as needed and not recently.    COPD- using advair twice daily, using albuterol daily  Reports she is almost or out of all her medication   Not taking gabapentin  Anemia- using iron tablet- taking once daily.   CHF/edema- stable, no worsening of swelling, weight gain or shortness of breath. Continues on Demadex 40 mg twice daily with potassium supplement. - does not take routinely (takes "as needed")  Review of Systems:  Review of Systems  Constitutional: Negative for chills, fever and weight loss.  HENT: Negative for tinnitus.   Respiratory: Negative for cough, sputum production and shortness of breath.   Cardiovascular: Negative for chest pain, palpitations  and leg swelling.  Gastrointestinal: Negative for abdominal pain, constipation, diarrhea and heartburn.  Genitourinary: Negative for dysuria, frequency and urgency.  Musculoskeletal: Negative for back pain, falls, joint pain and myalgias.  Skin: Negative.  Negative for itching and rash.  Neurological: Negative for dizziness and headaches.  Psychiatric/Behavioral: Negative for depression and memory loss. The patient does not have insomnia.     Past Medical History:  Diagnosis Date  . Anemia, unspecified   . Anxiety   . Arthritis   . Asthma   . Delusions ([place])   . Depression   . Edema 2005   "since put the glass in them"   . GERD (gastroesophageal reflux disease)   . Lumbago   . Lymphedema   . Obstructive chronic bronchitis without exacerbation ([place])   . Other abnormal glucose   . 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 Systolic heart failure (HCC)    EF 35 to 40% per echo January 2015  . Unspecified essential hypertension   . Unspecified nonpsychotic mental disorder   . Unspecified venous (peripheral) insufficiency   . Unspecified venous (peripheral) insufficiency   . Unspecified vitamin D deficiency    Past Surgical History:  Procedure Laterality Date  . APPENDECTOMY    . CERVICAL SPINE SURGERY     x 2  . CHOLECYSTECTOMY    . ESOPHAGOGASTRODUODENOSCOPY N/A 04/05/2015   Procedure: ESOPHAGOGASTRODUODENOSCOPY (EGD);  Surgeon: [name], MD;  Location: [facility];  Service: Endoscopy;  Laterality: N/A;  . FLEXIBLE SIGMOIDOSCOPY N/A 04/05/2015   Procedure: FLEXIBLE SIGMOIDOSCOPY;  Surgeon: [name], MD;  Location: [facility];  Service: Endoscopy;  Laterality: N/A;  . HAND SURGERY    . LUMBAR DISC SURGERY    . PARTIAL HYSTERECTOMY    . TONSILLECTOMY     Social History:   reports that she has never smoked. She has never used smokeless tobacco. She reports that she does not drink alcohol or use drugs.  Family History  Problem Relation Age of Onset  . Diabetes  Mother   . Arthritis Father   . Diabetes Paternal Grandfather        entire family on both sides  . Heart disease Maternal Aunt        entire family of both sides  . Cancer Paternal Aunt        type unknown  . Diabetes Sister   . Breast cancer Cousin     Medications: Patient's Medications  New Prescriptions   No medications on file  Previous Medications   ACETAMINOPHEN (TYLENOL 8 HOUR) 650 MG CR TABLET    Take 1 tablet (650 mg total) by mouth 3 (three) times daily with meals.   ALBUTEROL (PROAIR HFA) 108 (90 BASE) MCG/ACT INHALER    INHALE 2 PUFFS EVERY 4 HOURS AS NEEDED FOR WHEEZING   ALPRAZOLAM (XANAX) 0.5 MG TABLET    Take 1/2 to 1 tablet by mouth three times daily as needed for nerves   ASCORBIC ACID (VITAMIN C) 1000 MG TABLET    2 by mouth twice daily before lunch and supper    CALAMINE LOTION    Apply 1 application topically 2 (two) times daily.   CALCIUM-VITAMIN D (OSCAL WITH D) 500-200 MG-UNIT TABLET    Take 1 tablet by mouth daily with breakfast.   CARVEDILOL (COREG) 6.25 MG TABLET    Take 1 tablet twice daily in lunch and supper.   CETIRIZINE (ZYRTEC) 10 MG TABLET    Take 1 tablet (10 mg total) by mouth daily.   CLOTRIMAZOLE-BETAMETHASONE (LOTRISONE) CREAM    Apply 1 application topically 2 (two) times daily. To your arm and leg rash   DENOSUMAB (PROLIA) 60 MG/ML SOSY INJECTION    Inject 60 mg into the skin every 6 (six) months.   FERROUS SULFATE DRIED 200 (65 FE) MG TABS    Take 1 tablet by mouth 2 (two) times daily before lunch and supper.   FLUTICASONE-SALMETEROL (ADVAIR HFA) 115-21 MCG/ACT INHALER    USE 2 INHALATIONS TWICE A DAY   GABAPENTIN (NEURONTIN) 100 MG CAPSULE    TAKE 2 CAPSULES BY MOUTH IN THE MORNING AND 2 CAPSULES AT BEDTIME   HYDRALAZINE (APRESOLINE) 25 MG TABLET    TAKE 1 TABLET(25 MG TOTAL) BY MOUTH TWICE DAILY AT 8 AM AND 10 PM   MINERAL OIL-HYDROPHILIC PETROLATUM (AQUAPHOR) OINTMENT    Apply topically as needed for dry skin.   OLANZAPINE (ZYPREXA) 5 MG  TABLET    Take 1 tablet (5 mg total) by mouth at bedtime.   POTASSIUM CHLORIDE SA (K-DUR) 20 MEQ TABLET    Take 2 tablets (40 mEq total) by mouth 2 (two) times daily before lunch and supper.   TORSEMIDE (DEMADEX) 20 MG TABLET    Take 2 tablets (40 mg total) by mouth 2 (two) times daily. Take 2 tabs at 8 am and 10 pm   VITAMIN D, ERGOCALCIFEROL, (DRISDOL) 1.25 MG (50000 UT) CAPS CAPSULE    Take  1 capsule (50,000 Units total) by mouth every Monday.  Modified Medications   No medications on file  Discontinued Medications   No medications on file    Physical Exam:  Vitals:   10/17/19 1503  BP: (!) 144/88  Pulse: 76  Temp: 97.7 F (36.5 C)  TempSrc: Temporal  SpO2: 92%  Weight: 139 lb 9.6 oz (63.3 kg)  Height: 4' 10"  (1.473 m)   Body mass index is 29.18 kg/m. Wt Readings from Last 3 Encounters:  10/17/19 139 lb 9.6 oz (63.3 kg)  07/24/19 142 lb 9.6 oz (64.7 kg)  07/03/19 138 lb 12.8 oz (63 kg)    Physical Exam Constitutional:      General: She is not in acute distress.    Appearance: She is well-developed. She is not diaphoretic.  HENT:     Head: Normocephalic and atraumatic.     Mouth/Throat:     Pharynx: No oropharyngeal exudate.  Eyes:     Conjunctiva/sclera: Conjunctivae normal.     Pupils: Pupils are equal, round, and reactive to light.  Cardiovascular:     Rate and Rhythm: Normal rate and regular rhythm.     Heart sounds: Normal heart sounds.  Pulmonary:     Effort: Pulmonary effort is normal.     Breath sounds: Decreased air movement present. Examination of the right-upper field reveals wheezing. Wheezing present.  Abdominal:     General: Bowel sounds are normal.     Palpations: Abdomen is soft.  Musculoskeletal:        General: No tenderness.     Cervical back: Normal range of motion and neck supple.     Right lower leg: Edema present.     Left lower leg: Edema present.  Skin:    General: Skin is warm and dry.     Findings: Rash present. Rash is scaling.       Comments: Rough raised rash consistent with eczema to posterior surface of bilateral hands- reports improved.   Neurological:     Mental Status: She is alert and oriented to person, place, and time.  Psychiatric:        Mood and Affect: Mood normal.        Cognition and Memory: Cognition is impaired.     Labs reviewed: Basic Metabolic Panel: Recent Labs    11/04/18 1603 11/28/18 1544 06/09/19 1605  NA 139 140 137  K 3.8 3.9 3.8  CL 101 106 104  CO2 30 25 28   GLUCOSE 77 81 126  BUN 55* 25 20  CREATININE 1.58* 1.30* 1.34*  CALCIUM 9.2 9.6 9.3   Liver Function Tests: Recent Labs    11/04/18 1603 06/09/19 1605  AST 17 16  ALT 9 10  BILITOT 0.4 0.4  PROT 8.3* 8.8*   No results for input(s): LIPASE, AMYLASE in the last 8760 hours. No results for input(s): AMMONIA in the last 8760 hours. CBC: Recent Labs    11/04/18 1603 06/09/19 1605 07/03/19 1513  WBC 5.2 5.1 6.5  NEUTROABS 3,037 3,453 4,745  HGB 10.9* 10.2* 10.0*  HCT 34.2* 32.5* 32.2*  MCV 83.8 85.3 85.9  PLT 252 229 322   Lipid Panel: No results for input(s): CHOL, HDL, LDLCALC, TRIG, CHOLHDL, LDLDIRECT in the last 8760 hours. TSH: No results for input(s): TSH in the last 8760 hours. A1C: Lab Results  Component Value Date   HGBA1C 5.5 04/14/2014     Assessment/Plan 1. Senile osteoporosis -continue cal and vit d - denosumab (PROLIA) injection  60 mg given today  2. MGUS (monoclonal gammopathy of unknown significance) - Ambulatory referral to Hematology  3. Paranoid delusion ([place]) -stable, anxiety is worse but has not taken medication routinely. No increase in behaviors or paranoid behaviors - OLANZapine (ZYPREXA) 5 MG tablet; Take 1 tablet (5 mg total) by mouth at bedtime.  Dispense: 90 tablet; Refill: 1  4. Essential hypertension -elevated today but has not been taking medication as prescribed. Does not add salt to food but suspect high sodium diet.  - hydrALAZINE (APRESOLINE) 25 MG tablet;  TAKE 1 TABLET(25 MG TOTAL) BY MOUTH TWICE DAILY AT 8 AM AND 10 PM  Dispense: 180 tablet; Refill: 1  5. Iron deficiency anemia, unspecified iron deficiency anemia type - Ferrous Sulfate Dried 200 (65 Fe) MG TABS; Take 1 tablet by mouth daily.  Dispense: 90 tablet; Refill: 1 - CBC with Differential/Platelet  6. Chronic systolic heart failure (HCC) Stable, without worsening of shortness of breath or edema however not routinely taking medication. Weight is down. Stress importance of disease management with medication compliane.  - potassium chloride SA (KLOR-CON) 20 MEQ tablet; Take 2 tablets (40 mEq total) by mouth 2 (two) times daily before lunch and supper.  Dispense: 180 tablet; Refill: 3 - torsemide (DEMADEX) 20 MG tablet; Take 2 tablets (40 mg total) by mouth 2 (two) times daily. Take 2 tabs at 8 am and 10 pm  Dispense: 240 tablet; Refill: 1 - carvedilol (COREG) 6.25 MG tablet; Take 1 tablet twice daily in lunch and supper.  Dispense: 180 tablet; Refill: 1 - BMP with eGFR - CBC with Differential/Platelet  7. Lymphedema of both lower extremities -ongoing but stable. No wounds at this time  followed by wound care clinic as well. She is noncompliant with her medication. Encouraged for nephew to help with medication management.  - potassium chloride SA (KLOR-CON) 20 MEQ tablet; Take 2 tablets (40 mEq total) by mouth 2 (two) times daily before lunch and supper.  Dispense: 180 tablet; Refill: 3 - torsemide (DEMADEX) 20 MG tablet; Take 2 tablets (40 mg total) by mouth 2 (two) times daily. Take 2 tabs at 8 am and 10 pm  Dispense: 240 tablet; Refill: 1  8. Vitamin D deficiency - Vitamin D, Ergocalciferol, (DRISDOL) 1.25 MG (50000 UT) CAPS capsule; Take 1 capsule (50,000 Units total) by mouth every Monday.  Dispense: 12 capsule; Refill: 1  9. Rectal benign neoplasm -last colonoscopy in 2015 at age 19 recommended 3 year follow up and she has not had. Pt with multiple co-morbidies and cognitive  impairment. Discussed with nephew on wishes for follow up (since they have not followed up at this point) he would like referral to discuss with GI on risk vs benefit.  - Ambulatory referral to Gastroenterology  10. Stage 3b chronic kidney disease -Encourage proper hydration and to avoid NSAIDS (Aleve, Advil, Motrin, Ibuprofen)  -BMP with eGFR  11. Medication noncompliance due to cognitive impairment -encouraged pill boxes and help with medication. Nephew is trying to help her with medication but compliance continues to be an issue.   12 COPD Wheezing noted to upper lobes, states that she is not taking advair routinely, encouraged compliance with this for better symptom management. To use albuterol as needed.   Next appt: 6 months.  [name]. [place], [facility] [phone]

## 2019-10-20 LAB — TEST AUTHORIZATION

## 2019-10-20 LAB — BASIC METABOLIC PANEL WITH GFR
BUN/Creatinine Ratio: 9 (calc) (ref 6–22)
BUN: 10 mg/dL (ref 7–25)
CO2: 26 mmol/L (ref 20–32)
Calcium: 9.5 mg/dL (ref 8.6–10.4)
Chloride: 106 mmol/L (ref 98–110)
Creat: 1.08 mg/dL — ABNORMAL HIGH (ref 0.60–0.88)
GFR, Est African American: 54 mL/min/{1.73_m2} — ABNORMAL LOW (ref >=60)
GFR, Est Non African American: 47 mL/min/{1.73_m2} — ABNORMAL LOW (ref >=60)
Glucose, Bld: 95 mg/dL (ref 65–139)
Potassium: 3.8 mmol/L (ref 3.5–5.3)
Sodium: 140 mmol/L (ref 135–146)

## 2019-10-20 LAB — CBC WITH DIFFERENTIAL/PLATELET
Absolute Monocytes: 507 cells/uL (ref 200–950)
Basophils Absolute: 41 cells/uL (ref 0–200)
Basophils Relative: 0.7 %
Eosinophils Absolute: 12 cells/uL — ABNORMAL LOW (ref 15–500)
Eosinophils Relative: 0.2 %
HCT: 31 % — ABNORMAL LOW (ref 35.0–45.0)
Hemoglobin: 9.5 g/dL — ABNORMAL LOW (ref 11.7–15.5)
Lymphs Abs: 1257 cells/uL (ref 850–3900)
MCH: 26.2 pg — ABNORMAL LOW (ref 27.0–33.0)
MCHC: 30.6 g/dL — ABNORMAL LOW (ref 32.0–36.0)
MCV: 85.6 fL (ref 80.0–100.0)
MPV: 10.3 fL (ref 7.5–12.5)
Monocytes Relative: 8.6 %
Neutro Abs: 4083 cells/uL (ref 1500–7800)
Neutrophils Relative %: 69.2 %
Platelets: 305 10*3/uL (ref 140–400)
RBC: 3.62 10*6/uL — ABNORMAL LOW (ref 3.80–5.10)
RDW: 13.5 % (ref 11.0–15.0)
Total Lymphocyte: 21.3 %
WBC: 5.9 10*3/uL (ref 3.8–10.8)

## 2019-10-20 LAB — IRON,TIBC AND FERRITIN PANEL
%SAT: 15 % (calc) — ABNORMAL LOW (ref 16–45)
Ferritin: 220 ng/mL (ref 16–288)
Iron: 30 ug/dL — ABNORMAL LOW (ref 45–160)
TIBC: 205 mcg/dL (calc) — ABNORMAL LOW (ref 250–450)

## 2019-11-04 ENCOUNTER — Encounter: Payer: Self-pay | Admitting: Gastroenterology

## 2019-11-06 ENCOUNTER — Encounter: Payer: Self-pay | Admitting: Oncology

## 2019-11-14 ENCOUNTER — Ambulatory Visit: Payer: Medicare Other | Admitting: Podiatry

## 2019-11-16 ENCOUNTER — Other Ambulatory Visit: Payer: Self-pay | Admitting: Nurse Practitioner

## 2019-11-16 DIAGNOSIS — J4489 Other specified chronic obstructive pulmonary disease: Secondary | ICD-10-CM

## 2019-11-16 DIAGNOSIS — J449 Chronic obstructive pulmonary disease, unspecified: Secondary | ICD-10-CM

## 2019-11-26 ENCOUNTER — Inpatient Hospital Stay: Payer: Medicare Other | Attending: Oncology | Admitting: Oncology

## 2019-11-26 ENCOUNTER — Other Ambulatory Visit: Payer: Self-pay

## 2019-11-26 VITALS — BP 184/83 | HR 78 | Temp 98.5°F | Resp 17 | Ht <= 58 in | Wt 135.0 lb

## 2019-11-26 DIAGNOSIS — R600 Localized edema: Secondary | ICD-10-CM | POA: Insufficient documentation

## 2019-11-26 DIAGNOSIS — D890 Polyclonal hypergammaglobulinemia: Secondary | ICD-10-CM | POA: Insufficient documentation

## 2019-11-26 DIAGNOSIS — Z79899 Other long term (current) drug therapy: Secondary | ICD-10-CM | POA: Insufficient documentation

## 2019-11-26 DIAGNOSIS — D649 Anemia, unspecified: Secondary | ICD-10-CM | POA: Diagnosis not present

## 2019-11-26 NOTE — Progress Notes (Signed)
Hematology and Oncology Follow Up Visit  [name] [id number] 01-Feb-1932 86 y.o. 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  She has also chronic multifactorial anemia.   Current therapy: Active surveillance  Interim History: [name] returns today for a repeat evaluation.  She is a pleasant 86 year old woman I saw in consultation in 2015 for elevated IgG and IgA immunoglobulins.  She had polyclonal gammopathy on SPEP without any evidence to suggest plasma cell disorder.  Laboratory testing in 2017 confirmed the original finding in 2015.  Clinically, she reports limited the quality of life and mobility at this time.  She has chronic lower extremity edema but does not report any new pain or shortness of breath.  Denies any excessive fatigue or tiredness.  Medications: Updated on review.  Current Outpatient Medications  Medication Sig Dispense Refill  . fluticasone-salmeterol (ADVAIR HFA) 115-21 MCG/ACT inhaler INHALE 2 PUFFS BY MOUTH TWICE DAILY 36 g 5  . acetaminophen (TYLENOL 8 HOUR) 650 MG CR tablet Take 1 tablet (650 mg total) by mouth 3 (three) times daily with meals. 90 tablet 0  . albuterol (PROAIR HFA) 108 (90 Base) MCG/ACT inhaler INHALE 2 PUFFS EVERY 4 HOURS AS NEEDED FOR WHEEZING 3 Inhaler 3  . Ascorbic Acid (VITAMIN C) 1000 MG tablet 2 by mouth twice daily before lunch and supper  120 tablet 6  . calcium-vitamin D (OSCAL WITH D) 500-200 MG-UNIT tablet Take 1 tablet by mouth daily with breakfast. 30 tablet 2  . carvedilol (COREG) 6.25 MG tablet Take 1 tablet twice daily in lunch and supper. 180 tablet 1  . cetirizine (ZYRTEC) 10 MG tablet Take 1 tablet (10 mg total) by mouth daily. 30 tablet 11  . denosumab (PROLIA) 60 MG/ML SOSY injection Inject 60 mg into  the skin every 6 (six) months.    . Ferrous Sulfate Dried 200 (65 Fe) MG TABS Take 1 tablet by mouth daily. 90 tablet 1  . hydrALAZINE (APRESOLINE) 25 MG tablet TAKE 1 TABLET(25 MG TOTAL) BY MOUTH TWICE DAILY AT 8 AM AND 10 PM 180 tablet 1  . OLANZapine (ZYPREXA) 5 MG tablet Take 1 tablet (5 mg total) by mouth at bedtime. 90 tablet 1  . potassium chloride SA (KLOR-CON) 20 MEQ tablet Take 2 tablets (40 mEq total) by mouth 2 (two) times daily before lunch and supper. 180 tablet 3  . torsemide (DEMADEX) 20 MG tablet Take 2 tablets (40 mg total) by mouth 2 (two) times daily. Take 2 tabs at 8 am and 10 pm 240 tablet 1  . Vitamin D, Ergocalciferol, (DRISDOL) 1.25 MG (50000 UT) CAPS capsule Take 1 capsule (50,000 Units total) by mouth every Monday. 12 capsule 1   No current facility-administered medications for this visit.     Allergies:  Allergies  Allergen Reactions  . Aspirin Other (See Comments)    REACTION: upset stomach  . Codeine Other (See Comments)    REACTION: "MAKES MY BODY GO CRAZY; CRAMPS"  . Latex Nausea And Vomiting    Physical Exam: Blood pressure (!) 184/83, pulse 78, temperature 98.5 F (36.9 C), temperature source Temporal, resp. rate 17, height 4\' 10"  (1.473 m), weight 135 lb (61.2 kg), SpO2 100 %. ECOG: 2   General appearance: Comfortable appearing without any discomfort Head: Normocephalic  without any trauma Oropharynx: Mucous membranes are moist and pink without any thrush or ulcers. Eyes: Pupils are equal and round reactive to light. Lymph nodes: No cervical, supraclavicular, inguinal or axillary lymphadenopathy.   Heart:regular rate and rhythm.  S1 and S2 lower extremity edema bilaterally. Lung: Clear without any rhonchi or wheezes.  No dullness to percussion. Abdomin: Soft, nontender, nondistended with good bowel sounds.  No hepatosplenomegaly. Musculoskeletal: No joint deformity or effusion.  Full range of motion noted. Neurological: No deficits noted on  motor, sensory and deep tendon reflex exam. Skin: No petechial rash or dryness.  Appeared moist.     Lab Results: Lab Results  Component Value Date   WBC 5.9 10/17/2019   HGB 9.5 (L) 10/17/2019   HCT 31.0 (L) 10/17/2019   MCV 85.6 10/17/2019   PLT 305 10/17/2019     Chemistry      Component Value Date/Time   NA 140 10/17/2019 1549   NA 136 04/22/2014 1024   K 3.8 10/17/2019 1549   K 4.2 04/22/2014 1024   CL 106 10/17/2019 1549   CO2 26 10/17/2019 1549   CO2 27 04/22/2014 1024   BUN 10 10/17/2019 1549   BUN 37.6 (H) 04/22/2014 1024   CREATININE 1.08 (H) 10/17/2019 1549   CREATININE 1.4 (H) 04/22/2014 1024      Component Value Date/Time   CALCIUM 9.5 10/17/2019 1549   CALCIUM 9.4 04/22/2014 1024   ALKPHOS 52 09/04/2017 1558   ALKPHOS 75 04/22/2014 1024   AST 16 06/09/2019 1605   AST 20 04/22/2014 1024   ALT 10 06/09/2019 1605   ALT 12 04/22/2014 1024   BILITOT 0.4 06/09/2019 1605   BILITOT 0.39 04/22/2014 1024          Impression and Plan:  86 year old woman with:  1.  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  She was found to have elevated IgG and IgA quantitative immunoglobulin dating back to 2012 without any changes after period of observation for 5 years.  In 2017 her laboratory data were really no different indicating likely reactive process rather than a plasma cell disorder.   The differential diagnosis was reviewed again with the patient and her nephew that accompanied her today.  Given the chronicity of these findings and overall stable status approaching 10 years now I doubt that there is a plasma cell disorder or any further evaluation is needed.  2. Anemia: Chronic in nature and appears to be related to chronic disease.  Her hemoglobin in December 2020 was 9.5 which is close to her baseline.  Iron studies likely indicate anemia of chronic disease other than iron deficiency.  She is asymptomatic at this time and does not  require really any intervention.  Consideration for growth factor support in the form of Aranesp or Procrit may be if her hemoglobin drops further.  3. Followup: No further hematology follow-up or evaluation is needed at this time.  I am happy to see her in the future as needed.   30  minutes was spent on this encounter.  Time was dedicated to reviewing laboratory data, discussing differential diagnosis and management options for the future.   Zola Button, MD 1/27/202111:43 AM

## 2019-11-27 ENCOUNTER — Telehealth: Payer: Self-pay | Admitting: Oncology

## 2019-11-27 NOTE — Telephone Encounter (Signed)
No los per 1/27. 

## 2019-11-28 ENCOUNTER — Ambulatory Visit (HOSPITAL_COMMUNITY): Payer: Medicare Other | Attending: Cardiology

## 2019-11-28 ENCOUNTER — Other Ambulatory Visit: Payer: Self-pay

## 2019-11-28 DIAGNOSIS — I5022 Chronic systolic (congestive) heart failure: Secondary | ICD-10-CM

## 2019-12-05 ENCOUNTER — Encounter: Payer: Self-pay | Admitting: Gastroenterology

## 2019-12-05 ENCOUNTER — Ambulatory Visit (INDEPENDENT_AMBULATORY_CARE_PROVIDER_SITE_OTHER): Payer: Medicare Other | Admitting: Gastroenterology

## 2019-12-05 VITALS — BP 122/80 | HR 68 | Temp 97.8°F | Wt 136.0 lb

## 2019-12-05 DIAGNOSIS — Z86010 Personal history of colonic polyps: Secondary | ICD-10-CM | POA: Diagnosis not present

## 2019-12-05 DIAGNOSIS — D649 Anemia, unspecified: Secondary | ICD-10-CM

## 2019-12-05 NOTE — Patient Instructions (Signed)
If you are age 84 or older, your body mass index should be between 23-30. Your Body mass index is 28.42 kg/m. If this is out of the aforementioned range listed, please consider follow up with your Primary Care Provider.  Please follow up as needed.  Thank you for entrusting me with your care and for choosing [facility], Dr. [place] Cellar

## 2019-12-05 NOTE — Progress Notes (Signed)
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 Deatra Ina, new patient to me, here for discussion of her history of colon polyps and anemia.  She appears to have had a multifactorial anemia over several years.  She has been followed by hematology, has history of polyclonal gammopathy without any evidence of a plasma cell disorder.  She has had a history of component of iron deficiency leading to endoscopic evaluation of the past, however most recent iron studies as outlined below are most consistent with anemia of chronic disease.  She denies any problems with her bowels other than occasional constipation for which she uses pineapple juice which actually works quite well for her.  She denies any blood in her stools.  No problems with her bowels.  She denies any abdominal pains.  She is eating well, no reflux problems.  She did have a recent nosebleed which she states was significant.  She has not had a nosebleed in several years.  She last had an endoscopy in 2016 which was normal.  She had a flex sig at that time as well which removed a small polyp from the rectum which was determined to be hyperplastic.  Her last full colonoscopy was in 2015 at which point time she had an 18 mm tubular adenoma with high-grade dysplasia removed.  She otherwise has no complaints today  Most recent iron studies Iron 30 TIBC 205 Iron sat 15% Ferritin 220 Hgb 9.5, MCV 85.6  Echo 11/28/19 - EF 55-60%  EGD 04/05/2015 - normal Flex sig 04/05/2015 - 4mm rectal polyp removed ,otherwise negative - hyperplastic polyp Colonoscopy 08/24/14 - 56mm polyp removed from rectum - TA with HGD   Past Medical History:  Diagnosis Date  . Anemia, unspecified   . Anxiety   . Arthritis   . Asthma   . Delusions ([place])   . Depression   . Edema 2005   "since put the glass in them"   . GERD  (gastroesophageal reflux disease)   . Lumbago   . Lymphedema   . Obstructive chronic bronchitis without exacerbation ([place])   . Other abnormal glucose   . 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 Systolic heart failure ([facility])    EF 35 to 40% per echo January 2015  . Unspecified essential hypertension   . Unspecified nonpsychotic mental disorder   . Unspecified venous (peripheral) insufficiency   . Unspecified venous (peripheral) insufficiency   . Unspecified vitamin D deficiency      Past Surgical History:  Procedure Laterality Date  . APPENDECTOMY    . CERVICAL SPINE SURGERY     x 2  . CHOLECYSTECTOMY    . ESOPHAGOGASTRODUODENOSCOPY N/A 04/05/2015   Procedure: ESOPHAGOGASTRODUODENOSCOPY (EGD);  Surgeon: [name], MD;  Location: [facility];  Service: Endoscopy;  Laterality: N/A;  . FLEXIBLE SIGMOIDOSCOPY N/A 04/05/2015   Procedure: FLEXIBLE SIGMOIDOSCOPY;  Surgeon: [name], MD;  Location: [facility];  Service: Endoscopy;  Laterality: N/A;  . HAND SURGERY    . LUMBAR DISC SURGERY    . PARTIAL HYSTERECTOMY    . TONSILLECTOMY     Family History  Problem Relation Age of Onset  . Diabetes Mother   . Arthritis Father   . Diabetes Paternal Grandfather        entire family on both sides  . Heart disease Maternal Aunt  entire family of both sides  . Cancer Paternal Aunt        type unknown  . Diabetes Sister   . Breast cancer Cousin    Social History   Tobacco Use  . Smoking status: Never Smoker  . Smokeless tobacco: Never Used  Substance Use Topics  . Alcohol use: No  . Drug use: No   Current Outpatient Medications  Medication Sig Dispense Refill  . acetaminophen (TYLENOL 8 HOUR) 650 MG CR tablet Take 1 tablet (650 mg total) by mouth 3 (three) times daily with meals. 90 tablet 0  . albuterol (PROAIR HFA) 108 (90 Base) MCG/ACT inhaler INHALE 2 PUFFS EVERY 4 HOURS AS NEEDED FOR WHEEZING 3 Inhaler 3  . Ascorbic Acid (VITAMIN C) 1000 MG tablet 2 by mouth  twice daily before lunch and supper  120 tablet 6  . calcium-vitamin D (OSCAL WITH D) 500-200 MG-UNIT tablet Take 1 tablet by mouth daily with breakfast. 30 tablet 2  . carvedilol (COREG) 6.25 MG tablet Take 1 tablet twice daily in lunch and supper. 180 tablet 1  . cetirizine (ZYRTEC) 10 MG tablet Take 1 tablet (10 mg total) by mouth daily. 30 tablet 11  . denosumab (PROLIA) 60 MG/ML SOSY injection Inject 60 mg into the skin every 6 (six) months.    . Ferrous Sulfate Dried 200 (65 Fe) MG TABS Take 1 tablet by mouth daily. 90 tablet 1  . fluticasone-salmeterol (ADVAIR HFA) 115-21 MCG/ACT inhaler INHALE 2 PUFFS BY MOUTH TWICE DAILY 36 g 5  . hydrALAZINE (APRESOLINE) 25 MG tablet TAKE 1 TABLET(25 MG TOTAL) BY MOUTH TWICE DAILY AT 8 AM AND 10 PM 180 tablet 1  . OLANZapine (ZYPREXA) 5 MG tablet Take 1 tablet (5 mg total) by mouth at bedtime. 90 tablet 1  . potassium chloride SA (KLOR-CON) 20 MEQ tablet Take 2 tablets (40 mEq total) by mouth 2 (two) times daily before lunch and supper. 180 tablet 3  . torsemide (DEMADEX) 20 MG tablet Take 2 tablets (40 mg total) by mouth 2 (two) times daily. Take 2 tabs at 8 am and 10 pm 240 tablet 1  . Vitamin D, Ergocalciferol, (DRISDOL) 1.25 MG (50000 UT) CAPS capsule Take 1 capsule (50,000 Units total) by mouth every Monday. 12 capsule 1   No current facility-administered medications for this visit.   Allergies  Allergen Reactions  . Aspirin Other (See Comments)    REACTION: upset stomach  . Codeine Other (See Comments)    REACTION: "MAKES MY BODY GO CRAZY; CRAMPS"  . Latex Nausea And Vomiting     Review of Systems: All systems reviewed and negative except where noted in HPI.    Lab Results  Component Value Date   WBC 5.9 10/17/2019   HGB 9.5 (L) 10/17/2019   HCT 31.0 (L) 10/17/2019   MCV 85.6 10/17/2019   PLT 305 10/17/2019    Lab Results  Component Value Date   CREATININE 1.08 (H) 10/17/2019   BUN 10 10/17/2019   NA 140 10/17/2019   K 3.8  10/17/2019   CL 106 10/17/2019   CO2 26 10/17/2019    Lab Results  Component Value Date   ALT 10 06/09/2019   AST 16 06/09/2019   ALKPHOS 52 09/04/2017   BILITOT 0.4 06/09/2019   CBC Latest Ref Rng & Units 10/17/2019 07/03/2019 06/09/2019  WBC 3.8 - 10.8 Thousand/uL 5.9 6.5 5.1  Hemoglobin 11.7 - 15.5 g/dL 9.5(L) 10.0(L) 10.2(L)  Hematocrit 35.0 - 45.0 % 31.0(L)  32.2(L) 32.5(L)  Platelets 140 - 400 Thousand/uL 305 322 229   Lab Results  Component Value Date   IRON 30 (L) 10/17/2019   TIBC 205 (L) 10/17/2019   FERRITIN 220 10/17/2019     Physical Exam: BP 122/80 (BP Location: Left Arm, Patient Position: Sitting, Cuff Size: Normal)   Pulse 68   Temp 97.8 F (36.6 C) (Oral)   Wt 136 lb (61.7 kg)   BMI 28.42 kg/m  Constitutional: Pleasant,well-developed, female in no acute distress. Cardiovascular: Normal rate, regular rhythm.  Pulmonary/chest: Effort normal and breath sounds normal. No wheezing, rales or rhonchi. Abdominal: Soft, nondistended, nontender. There are no masses palpable. No hepatomegaly. Extremities: no edema Lymphadenopathy: No cervical adenopathy noted. Neurological: Alert and oriented to person place and time. Skin: Skin is warm and dry. No rashes noted. Psychiatric: Normal mood and affect. Behavior is normal.   ASSESSMENT AND PLAN: 86 year old female here for assessment of the following:  Anemia / history of colon polyps - she does not have iron deficiency at this time, her anemia appears to be due to anemia of chronic disease as per review of her hematology evaluation.  She is asymptomatic from this and has no GI blood loss that we can gather right now.  Dr. Alen Blew had recommended consideration for growth factor support with Aranesp or Procrit if her hemoglobin drops in the future.  She has had an endoscopic evaluation in 2015/16 for anemia which did not reveal a clear source.  She did have an advanced adenoma which was removed and had surveillance of that  particular area in 2016 without recurrence.  Given her advanced age and comorbidities, I do not feel further surveillance colonoscopy is warranted, risks probably outweigh benefits at this time.  I discussed these issues with the patient and she agreed, she does not wish to pursue any further surveillance exams especially if she is asymptomatic.  She can follow-up with [place] as needed moving forward, I provided her my contact information.  She should have her hemoglobin monitored periodically to ensure no worsening of anemia over time.  I spent 30 minutes of time, including in depth chart review, independent review of results as outlined above, communicating results with the patient directly, face-to-face time with the patient, and documenting this encounter.   Bucklin Cellar, MD [facility]

## 2020-01-15 ENCOUNTER — Other Ambulatory Visit: Payer: Self-pay | Admitting: Nurse Practitioner

## 2020-01-15 DIAGNOSIS — G629 Polyneuropathy, unspecified: Secondary | ICD-10-CM

## 2020-02-03 ENCOUNTER — Other Ambulatory Visit: Payer: Self-pay

## 2020-02-03 DIAGNOSIS — E559 Vitamin D deficiency, unspecified: Secondary | ICD-10-CM

## 2020-02-03 MED ORDER — VITAMIN D (ERGOCALCIFEROL) 1.25 MG (50000 UNIT) PO CAPS: 50000.0000 [IU] | ORAL_CAPSULE | ORAL | 0 refills | Status: DC

## 2020-02-27 ENCOUNTER — Encounter (HOSPITAL_BASED_OUTPATIENT_CLINIC_OR_DEPARTMENT_OTHER): Payer: Medicare Other | Admitting: Internal Medicine

## 2020-03-04 ENCOUNTER — Encounter (HOSPITAL_BASED_OUTPATIENT_CLINIC_OR_DEPARTMENT_OTHER): Payer: Medicare Other | Attending: Internal Medicine | Admitting: Internal Medicine

## 2020-03-04 DIAGNOSIS — I872 Venous insufficiency (chronic) (peripheral): Secondary | ICD-10-CM | POA: Diagnosis not present

## 2020-03-04 DIAGNOSIS — I890 Lymphedema, not elsewhere classified: Secondary | ICD-10-CM | POA: Diagnosis not present

## 2020-03-04 DIAGNOSIS — E1169 Type 2 diabetes mellitus with other specified complication: Secondary | ICD-10-CM | POA: Insufficient documentation

## 2020-03-04 DIAGNOSIS — I110 Hypertensive heart disease with heart failure: Secondary | ICD-10-CM | POA: Insufficient documentation

## 2020-03-04 DIAGNOSIS — F419 Anxiety disorder, unspecified: Secondary | ICD-10-CM | POA: Diagnosis not present

## 2020-03-04 DIAGNOSIS — Z885 Allergy status to narcotic agent status: Secondary | ICD-10-CM | POA: Diagnosis not present

## 2020-03-04 DIAGNOSIS — Z833 Family history of diabetes mellitus: Secondary | ICD-10-CM | POA: Insufficient documentation

## 2020-03-04 DIAGNOSIS — I5022 Chronic systolic (congestive) heart failure: Secondary | ICD-10-CM | POA: Insufficient documentation

## 2020-03-04 DIAGNOSIS — J449 Chronic obstructive pulmonary disease, unspecified: Secondary | ICD-10-CM | POA: Insufficient documentation

## 2020-03-04 DIAGNOSIS — Z886 Allergy status to analgesic agent status: Secondary | ICD-10-CM | POA: Insufficient documentation

## 2020-03-04 NOTE — Progress Notes (Signed)
[name], [name] ([id number]) Visit Report for 03/04/2020 HPI Details Patient Name: Date of Service: [name], [name] [facility] RA C. 03/04/2020 10:30 A M Medical Record Number: [id number] Patient Account Number: [id number] Date of Birth/Sex: Treating RN: July 23, 1932 (86 y.o. Debby Bud Primary Care Provider: Sherrie Mustache Other Clinician: Referring Provider: Treating Provider/Extender: Deloria Lair in Treatment: 0 History of Present Illness HPI Description: 11/26/15; the patient arrives today I'm not exactly sure who referred her. She is here for our review of bilateral edema of her left greater than right legs. She asked me right away if we could wrap them. I note that she has a history of chronic stage III renal failure. She is noted to have chronic venous insufficiency and lymphedema. I really don't have much more of history here. Dr. [name] who is her cardiologist notes chronic systolic heart failure, her echocardiogram in January 2015 showed an ejection fraction of 35-40%. I do not see any formal venous or arterial studies. She is not a listed diabetic via her problem list in [place] link. As far as discussing the edema in her legs with the patient she really gives a very circumferential history that is hard to follow. I am not sure that she is really had previous wounds in her legs. There is an asymmetry with the left leg being a lot more edematous than the right I am not sure of the history here either. Almost from her initial intake here. She made a series of paranoid sounding statements that made our intake nurse is concerned that she may be at risk. Most of this directed against the son-in-law. 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 She is apparently refused psychiatric evaluation. 12/30/15; the patient arrives here having presumably missed her one-week follow-up  appointment today. I'm not sure how things got this far out to. In any case she never received the stockings that we ordered last time. She continues to have a significant lipid lymphedema of her left leg down to her foot. I try to go over the history of this with her, it is clear that there is some chronicity to this problem but I'm not really sure how long she has had this it is at least several years as she states her husband died 2 weeks ago and that point the edema in the left leg was a lot more than now 02/11/16; the patient arrives accompanied by her daughter who apparently has been staying with her but who his primary residence is in [place]. I'm not completely certain why she didn't return here. We had ordered her Juzo stockings and her last visit in early March patient arrives saying that the company called and they are no longer making these. She is apparently being seen Monday Wednesday and Friday at the lymphedema clinic in [place]. The daughter states that she is being taught to do the lymphedema wraps however makes it [place] aware that she is returning to [place] on Friday. Apparently a nephew will be taking her to the lymphedema clinic, but nobody seems to think that the patient can actually do this. In the meantime she appears to have no open wounds on her bilateral lower legs with careful inspection 02/14/16; this is a patient I discharged on Friday. That she was going to follow up with the lymphedema clinic in [place]. We did order her juxtalite stockings. Sometime over  the weekend the left leg has become much larger and somewhat more painful. She was seen today in the lymphedema clinic and they refused to wrap her leg. There are really still no open areas here 02/18/16: This patient I discharged last week however she came to [place] from the lymphedema clinic in [facility] with uncontrolled edema of the left leg. A duplex ultrasound of the leg was negative for DVT that we  arrange and then we rewrapped her leg with a Profore light. We have attempted on at least 2 occasions to get juxtalite stockings in her on however for one reason or another. 02/25/16; patient has no open wounds. She has not yet obtained her juxtalite's although family was present and insisted they've been ordered and paid for READMISSION 07/09/18 This is a now 86 year old woman who comes to clinic accompanied by her nephew. She was last in our clinic in nearly part of 2017. At that time she did not appear to have open wounds on her legs either. There appeared to be a constellation of problems related to obtaining lower extremity stockings, uncontrolled edema etc. She also had a duplex ultrasound that was negative for DVT We discharged her in juxta light stockings which the patient states she is used for . some period of time but more recently is using some form of lower extremity compression stocking. She apparently developed problems late last month and saw her primary doctor Dr. Gildardo Cranker for bilateral cellulitis of her lower extremity. She has since been followed by home health and they've been putting Unna boots on her. She is supposed to go to see the lymphedema clinic in [place] later in the week and apparently they wanted [place] to look at this to make sure there were no open areas. Also problematic is that you cannot be seen at the lymphedema clinic with home health therefore apparently home health is discharging on Friday i.e. in 3 days' time The patient has a history of lymphedema left greater than right, psychosis NYD, anemia, anxiety, hypertension and venous insufficiency. She has not had any arterial studies READMISSION 10/08/2018 The patient is back in clinic today with again bilateral lower extremity lymphedema. She was here in 2017 for lymphedema and we discharged her with juxta lites. I do not know that she ever really wore these. For the last several months patient has been  going to the lymphedema clinic in [place]. They have been wrapping her 3 times a week. Apparently they placed this on Thursday and the patient took it off on Sunday and there were noted to be wounds on her bilateral lower extremities also what looks to be excoriations from scratching although the patient denied it. They went into the clinic on Monday and the occupational therapist there would not replace the wraps because there was open areas. The patient is complaining of itching but no pain. PMhx; Her past medical history is otherwise unchanged she has lymphedema left greater than right, psychosis NYD, anxiety, hypertension and venous insufficiency. ABIs in our clinic were noncompressible bilaterally 10/25/2018; 2-week follow-up. The patient has no open wounds. Her edema is under good control. She has an appointment early in January with the lymphedema clinic in [place]. Readmission: 08/20/2019 patient is seen today for reevaluation here in our clinic concerning issues she is having with recurrent openings specifically on the right lower extremity she has bilateral lower extremity lymphedema. We did order her juxta light compression last time she was here unfortunately she does not know where those  are at this point she says they were misplaced. With that being said there is no signs of active infection she does have some drainage on the right lower extremity. Fortunately there is no evidence of active infection at this time which is good news she has a little bit of pain at this location where she is draining. No fevers, chills, nausea, vomiting, or diarrhea. 09/03/2019 on evaluation today patient appears to be doing decently well with regard to her lower extremities although she does have a small wound which is new on the left lower extremity and her right lower extremity is still open as well. She is having some pain and she inquired about pain medication. With that being said I explained  to the patient and her family member that we do not actually prescribe pain medications here for the wound care center we normally defer to the primary care or potentially pain management as a referral if were unable to gain help for the patient through the primary care provider. With that being said I am more than happy to send a note to them this afternoon just to see if they will be able to help out with her pain management control for short-term. No fevers, chills, nausea, vomiting, or diarrhea. READMISSION 03/04/2020; This is an 86 year old woman we have had multiple times in this clinic previously. She has lymphedema and a history of wounds in her lower extremities. She was here in 2017, 2019 for 2 visits and then again for 2 visits in 2020. We last saw her the wound was on the left leg. Our listings show that she has bilateral juxta lites. She has also been followed in the past at the lymphedema clinic in [place] They come here essentially to have [place] look at her legs because they want to go back to the lymphedema clinic in [place] and apparently they needed [place] to verify that there were no open areas on her legs. Really not only where there are no open areas today but the legs looked really as good as I have seen this. Her nephew who is her primary caregiver told us that things looked concerning about 3 weeks ago when they made this appointment and they thought they were going to have open areas however apparently a cousin came over and applied lymphedema wrap that they had at home and this is gotten things under control. It is not really clear to me whether she ever wore the juxta lites. 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 03/04/2020 10:30 A M Medical Record Number: [id number] Patient Account Number: [id number] Date of Birth/Sex: Treating RN: Mar 02, 1932 (86 y.o. Debby Bud Primary Care Provider: Sherrie Mustache Other Clinician: Referring Provider: Treating Provider/Extender: Deloria Lair in Treatment: 0 Constitutional Sitting or standing Blood Pressure is within target range for patient.. Pulse regular and within target range for patient.[name] Respirations regular, non-labored and within target range.. Temperature is normal and within the target range for the patient.[name] Appears in no distress. Eyes Conjunctivae clear. No discharge.no icterus. Respiratory work of breathing is normal. Bilateral breath sounds are clear and equal in all lobes with no wheezes, rales or rhonchi.. Cardiovascular Heart rhythm and rate regular, without murmur or gallop. Jugular venous  pressure was not elevated no S3. Pedal pulses are palpable. Bilateral lymphedema left greater than right nonpitting edema. Very significant around the left dorsal foot and left ankle but again there are no open wounds in this area.. Integumentary (Hair, Skin) Her skin was carefully inspected there are no open wounds to be seen. We do have very significant hemosiderin deposition. Psychiatric appears at normal baseline. Electronic Signature(s) Signed: 03/04/2020 5:30:42 PM By: Linton Ham MD Entered By: Linton Ham on 03/04/2020 12:03:52 -------------------------------------------------------------------------------- Physician Orders Details Patient Name: Date of Service: [name] RBA RA C. 03/04/2020 10:30 A M Medical Record Number: [id number] Patient Account Number: [id number] Date of Birth/Sex: Treating RN: June 25, 1932 (86 y.o. Debby Bud Primary  Care Provider: Sherrie Mustache Other Clinician: Referring Provider: Treating Provider/Extender: Deloria Lair in Treatment: 0 Verbal / Phone Orders: No Diagnosis Coding ICD-10 Coding Code Description I89.0 Lymphedema, not elsewhere classified Discharge From [facility] Discharge from [place] - call if any future wound care needs. Patient to follow up with lymphedema clinic. Skin Barriers/Peri-Wound Care Moisturizing lotion - both legs nightly. Edema Control Patient to wear own compression stockings - Patient to wear juxtalites to both legs. Apply in the am and remove in the pm. Patient to follow instructions of lymphedema specialist. Avoid standing for long periods of time Elevate legs to the level of the heart or above for 30 minutes daily and/or when sitting, a frequency of: - throughout the day. Exercise regularly Electronic Signature(s) Signed: 03/04/2020 5:30:42 PM By: Linton Ham MD Signed: 03/04/2020 5:34:26 PM By: Deon Pilling Entered By: Deon Pilling on 03/04/2020 11:22:48 -------------------------------------------------------------------------------- Problem List Details Patient Name: Date of Service: [name] RBA RA C. 03/04/2020 10:30 A M Medical Record Number: [id number] Patient Account Number: [id number] Date of Birth/Sex: Treating RN: 04/19/1932 (86 y.o. Debby Bud Primary Care Provider: Sherrie Mustache Other Clinician: Referring Provider: Treating Provider/Extender: Deloria Lair in Treatment: 0 Active Problems ICD-10 Encounter Code Description Active Date MDM Diagnosis I89.0 Lymphedema, not elsewhere classified 03/04/2020 No Yes Inactive Problems Resolved Problems Electronic Signature(s) Signed: 03/04/2020 5:30:42 PM By: Linton Ham MD Entered By: Linton Ham on 03/04/2020 11:58:50 -------------------------------------------------------------------------------- Progress  Note/History and Physical Details Patient Name: Date of Service: [name] RBA RA C. 03/04/2020 10:30 A M Medical Record Number: [id number] Patient Account Number: [id number] Date of Birth/Sex: Treating RN: 1932-07-08 (86 y.o. Debby Bud Primary Care Provider: Sherrie Mustache Other Clinician: Referring Provider: Treating Provider/Extender: Deloria Lair in Treatment: 0 Subjective History of Present Illness (HPI) 11/26/15; the patient arrives today I'm not exactly sure who referred her. She is here for our review of bilateral edema of her left greater than right legs. She asked me right away if we could wrap them. I note that she has a history of chronic stage III renal failure. She is noted to have chronic venous insufficiency and lymphedema. I really don't have much more of history here. Dr. [name] who is her cardiologist notes chronic systolic heart failure, her echocardiogram in January 2015 showed an ejection fraction of 35-40%. I do not see any formal venous or arterial studies. She is not a listed diabetic via her problem list in [place] link. As far as discussing the edema in her legs with the patient she really gives a very circumferential history that is hard to follow. I am not sure that she is really had previous wounds in her legs. There is an asymmetry with the left leg being  a lot more edematous than the right I am not sure of the history here either. Almost from her initial intake here. She made a series of paranoid sounding statements that made our intake nurse is concerned that she may be at risk. Most of this directed against the son-in-law. 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 She is apparently refused psychiatric evaluation. 12/30/15; the patient arrives here having presumably missed her one-week follow-up appointment today. I'm not sure how  things got this far out to. In any case she never received the stockings that we ordered last time. She continues to have a significant lipid lymphedema of her left leg down to her foot. I try to go over the history of this with her, it is clear that there is some chronicity to this problem but I'm not really sure how long she has had this it is at least several years as she states her husband died 2 weeks ago and that point the edema in the left leg was a lot more than now 02/11/16; the patient arrives accompanied by her daughter who apparently has been staying with her but who his primary residence is in [place]. I'm not completely certain why she didn't return here. We had ordered her Juzo stockings and her last visit in early March patient arrives saying that the company called and they are no longer making these. She is apparently being seen Monday Wednesday and Friday at the lymphedema clinic in [place]. The daughter states that she is being taught to do the lymphedema wraps however makes it [place] aware that she is returning to [place] on Friday. Apparently a nephew will be taking her to the lymphedema clinic, but nobody seems to think that the patient can actually do this. In the meantime she appears to have no open wounds on her bilateral lower legs with careful inspection 02/14/16; this is a patient I discharged on Friday. That she was going to follow up with the lymphedema clinic in [place]. We did order her juxtalite stockings. Sometime over the weekend the left leg has become much larger and somewhat more painful. She was seen today in the lymphedema clinic and they refused to wrap her leg. There are really still no open areas here 02/18/16: This patient I discharged last week however she came to [place] from the lymphedema clinic in [facility] with uncontrolled edema of the left leg. A duplex ultrasound of the leg was negative for DVT that we arrange and then we rewrapped her leg  with a Profore light. We have attempted on at least 2 occasions to get juxtalite stockings in her on however for one reason or another. 02/25/16; patient has no open wounds. She has not yet obtained her juxtalite's although family was present and insisted they've been ordered and paid for READMISSION 07/09/18 This is a now 86 year old woman who comes to clinic accompanied by her nephew. She was last in our clinic in nearly part of 2017. At that time she did not appear to have open wounds on her legs either. There appeared to be a constellation of problems related to obtaining lower extremity stockings, uncontrolled edema etc. She also had a duplex ultrasound that was negative for DVT We discharged her in juxta light stockings which the patient states she is used for . some period of time but more recently is using some form of lower  extremity compression stocking. She apparently developed problems late last month and saw her primary doctor Dr. Gildardo Cranker for bilateral cellulitis of her lower extremity. She has since been followed by home health and they've been putting Unna boots on her. She is supposed to go to see the lymphedema clinic in [place] later in the week and apparently they wanted [place] to look at this to make sure there were no open areas. Also problematic is that you cannot be seen at the lymphedema clinic with home health therefore apparently home health is discharging on Friday i.e. in 3 days' time The patient has a history of lymphedema left greater than right, psychosis NYD, anemia, anxiety, hypertension and venous insufficiency. She has not had any arterial studies READMISSION 10/08/2018 The patient is back in clinic today with again bilateral lower extremity lymphedema. She was here in 2017 for lymphedema and we discharged her with juxta lites. I do not know that she ever really wore these. For the last several months patient has been going to the lymphedema clinic in  [place]. They have been wrapping her 3 times a week. Apparently they placed this on Thursday and the patient took it off on Sunday and there were noted to be wounds on her bilateral lower extremities also what looks to be excoriations from scratching although the patient denied it. They went into the clinic on Monday and the occupational therapist there would not replace the wraps because there was open areas. The patient is complaining of itching but no pain. PMhx; Her past medical history is otherwise unchanged she has lymphedema left greater than right, psychosis NYD, anxiety, hypertension and venous insufficiency. ABIs in our clinic were noncompressible bilaterally 10/25/2018; 2-week follow-up. The patient has no open wounds. Her edema is under good control. She has an appointment early in January with the lymphedema clinic in [place]. Readmission: 08/20/2019 patient is seen today for reevaluation here in our clinic concerning issues she is having with recurrent openings specifically on the right lower extremity she has bilateral lower extremity lymphedema. We did order her juxta light compression last time she was here unfortunately she does not know where those are at this point she says they were misplaced. With that being said there is no signs of active infection she does have some drainage on the right lower extremity. Fortunately there is no evidence of active infection at this time which is good news she has a little bit of pain at this location where she is draining. No fevers, chills, nausea, vomiting, or diarrhea. 09/03/2019 on evaluation today patient appears to be doing decently well with regard to her lower extremities although she does have a small wound which is new on the left lower extremity and her right lower extremity is still open as well. She is having some pain and she inquired about pain medication. With that being said I explained to the patient and her family  member that we do not actually prescribe pain medications here for the wound care center we normally defer to the primary care or potentially pain management as a referral if were unable to gain help for the patient through the primary care provider. With that being said I am more than happy to send a note to them this afternoon just to see if they will be able to help out with her pain management control for short-term. No fevers, chills, nausea, vomiting, or diarrhea. READMISSION 03/04/2020; This is an 86 year old woman we have had multiple times in  this clinic previously. She has lymphedema and a history of wounds in her lower extremities. She was here in 2017, 2019 for 2 visits and then again for 2 visits in 2020. We last saw her the wound was on the left leg. Our listings show that she has bilateral juxta lites. She has also been followed in the past at the lymphedema clinic in [place] They come here essentially to have [place] look at her legs because they want to go back to the lymphedema clinic in [place] and apparently they needed [place] to verify that there were no open areas on her legs. Really not only where there are no open areas today but the legs looked really as good as I have seen this. Her nephew who is her primary caregiver told us that things looked concerning about 3 weeks ago when they made this appointment and they thought they were going to have open areas however apparently a cousin came over and applied lymphedema wrap that they had at home and this is gotten things under control. It is not really clear to me whether she ever wore the juxta lites. 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 Allergies aspirin (Severity: Moderate, Reaction: upset stomach), codeine (Severity: Moderate, Reaction: "makes by body go  crazy; cramps"), latex (Severity: Moderate, Reaction: nausea and vomiting) Family History Diabetes - Mother,Paternal Grandparents, No family history of Cancer, Heart Disease, Hereditary Spherocytosis, Hypertension, Kidney Disease, Lung Disease, Seizures, Stroke, Thyroid Problems, Tuberculosis. Social History Never smoker, Marital Status - Widowed, Alcohol Use - Never, Drug Use - No History, Caffeine Use - Daily - coffee. Medical History Eyes Denies history of Cataracts, Glaucoma, Optic Neuritis Ear/Nose/Mouth/Throat Denies history of Chronic sinus problems/congestion, Middle ear problems Hematologic/Lymphatic Patient has history of Anemia, Lymphedema Denies history of Hemophilia, Human Immunodeficiency Virus, Sickle Cell Disease Respiratory Patient has history of Asthma, Chronic Obstructive Pulmonary Disease (COPD) Denies history of Aspiration, Pneumothorax, Sleep Apnea, Tuberculosis Cardiovascular Patient has history of Congestive Heart Failure - systolic heart failure, Hypertension Denies history of Angina, Arrhythmia, Hypotension, Myocardial Infarction, Peripheral Arterial Disease, Peripheral Venous Disease, Phlebitis, Vasculitis Gastrointestinal Denies history of Cirrhosis , Colitis, Crohnoos, Hepatitis A, Hepatitis B, Hepatitis C Endocrine Patient has history of Type II Diabetes Denies history of Type I Diabetes Genitourinary Denies history of End Stage Renal Disease Immunological Denies history of Lupus Erythematosus, Raynaudoos, Scleroderma Integumentary (Skin) Denies history of History of Burn Musculoskeletal Denies history of Gout, Rheumatoid Arthritis, Osteoarthritis, Osteomyelitis Neurologic Denies history of Dementia, Neuropathy, Quadriplegia, Paraplegia, Seizure Disorder Oncologic Denies history of Received Chemotherapy, Received Radiation Psychiatric Denies history of Anorexia/bulimia, Confinement Anxiety Patient is treated with Controlled  Diet. Hospitalization/Surgery History - cholecystectomy. - tonsillectomy. - cervical spine surgery x2. - partial hysterectomy. - lumbar disc surgery. - esophogastroduodenoscopy. - flexible sigmoidoscopy. Medical A Surgical History Notes nd Cardiovascular unspecified venous insufficiency Gastrointestinal GERD Endocrine Borderline diabetic Musculoskeletal arthritis Psychiatric unspecified nonpsychotic mental disorder Review of Systems (ROS) Constitutional Symptoms (General Health) Denies complaints or symptoms of Fatigue, Fever, Chills, Marked Weight Change. Eyes Denies complaints or symptoms of Dry Eyes, Vision Changes, Glasses / Contacts. Ear/Nose/Mouth/Throat Denies complaints or symptoms of Chronic sinus problems or rhinitis. Respiratory Denies complaints or symptoms of Chronic or frequent coughs, Shortness of Breath. Cardiovascular Denies complaints or symptoms of Chest pain. Gastrointestinal Denies complaints or symptoms of Frequent diarrhea, Nausea, Vomiting. Endocrine Denies complaints or symptoms of Heat/cold intolerance. Genitourinary Denies  complaints or symptoms of Frequent urination. Integumentary (Skin) Denies complaints or symptoms of Wounds. Musculoskeletal Denies complaints or symptoms of Muscle Pain, Muscle Weakness. Neurologic Denies complaints or symptoms of Numbness/parasthesias. Psychiatric Denies complaints or symptoms of Claustrophobia, Suicidal. Objective Constitutional Sitting or standing Blood Pressure is within target range for patient.. Pulse regular and within target range for patient.[name] Respirations regular, non-labored and within target range.. Temperature is normal and within the target range for the patient.[name] Appears in no distress. Vitals Time Taken: 10:35 AM, Height: 59 in, Source: Stated, Weight: 124 lbs, Source: Stated, BMI: 25, Temperature: 98.4 F, Pulse: 69 bpm, Respiratory Rate: 18 breaths/min, Blood Pressure: 127/72  mmHg. Eyes Conjunctivae clear. No discharge.no icterus. Respiratory work of breathing is normal. Bilateral breath sounds are clear and equal in all lobes with no wheezes, rales or rhonchi.. Cardiovascular Heart rhythm and rate regular, without murmur or gallop. Jugular venous pressure was not elevated no S3. Pedal pulses are palpable. Bilateral lymphedema left greater than right nonpitting edema. Very significant around the left dorsal foot and left ankle but again there are no open wounds in this area.[name] Psychiatric appears at normal baseline. Integumentary (Hair, Skin) Her skin was carefully inspected there are no open wounds to be seen. We do have very significant hemosiderin deposition. Assessment Active Problems ICD-10 Lymphedema, not elsewhere classified Plan Discharge From [facility] Services: Discharge from [place] - call if any future wound care needs. Patient to follow up with lymphedema clinic. Skin Barriers/Peri-Wound Care: Moisturizing lotion - both legs nightly. Edema Control: Patient to wear own compression stockings - Patient to wear juxtalites to both legs. Apply in the am and remove in the pm. Patient to follow instructions of lymphedema specialist. Avoid standing for long periods of time Elevate legs to the level of the heart or above for 30 minutes daily and/or when sitting, a frequency of: - throughout the day. Exercise regularly 1. Patient known fairly well to [place] with bilateral left greater than right lymphedema. She has had difficulty closed wounds in the past but there is nothing open currently 2. We encouraged return to a lymphedema clinic either in [place] or we understand there is one that is now open in [facility]. 3. The patient has a history of congestive heart failure but has nothing based on clinical exam to be concerned about. 4. The patient lives alone but has very significant support from family members. It did not sound like they ever actually  wore the juxta lites that we said she had when she was last here in November 2020. I spent 30 minutes in review of this patient's past medical record, face-to-face evaluation and preparation of this record Electronic Signature(s) Signed: 03/04/2020 5:30:42 PM By: Linton Ham MD Entered By: Linton Ham on 03/04/2020 12:06:21 -------------------------------------------------------------------------------- HxROS Details Patient Name: Date of Service: [name] RBA RA C. 03/04/2020 10:30 A M Medical Record Number: [id number] Patient Account Number: [id number] Date of Birth/Sex: Treating RN: 18-Apr-1932 (86 y.o. Orvan Falconer Primary Care Provider: Sherrie Mustache Other Clinician: Referring Provider: Treating Provider/Extender: Deloria Lair in Treatment: 0 Label Progress Note Print Version as History and Physical for this encounter Information Obtained From Patient Constitutional Symptoms (General Health) Complaints and Symptoms: Negative for: Fatigue; Fever; Chills; Marked Weight Change Eyes Complaints and Symptoms: Negative for: Dry Eyes; Vision Changes; Glasses / Contacts Medical History: Negative for: Cataracts; Glaucoma; Optic Neuritis Ear/Nose/Mouth/Throat Complaints and Symptoms: Negative for: Chronic sinus problems or rhinitis Medical History: Negative for: Chronic sinus  problems/congestion; Middle ear problems Respiratory Complaints and Symptoms: Negative for: Chronic or frequent coughs; Shortness of Breath Medical History: Positive for: Asthma; Chronic Obstructive Pulmonary Disease (COPD) Negative for: Aspiration; Pneumothorax; Sleep Apnea; Tuberculosis Cardiovascular Complaints and Symptoms: Negative for: Chest pain Medical History: Positive for: Congestive Heart Failure - systolic heart failure; Hypertension Negative for: Angina; Arrhythmia; Hypotension; Myocardial Infarction; Peripheral Arterial Disease; Peripheral Venous Disease;  Phlebitis; Vasculitis Past Medical History Notes: unspecified venous insufficiency Gastrointestinal Complaints and Symptoms: Negative for: Frequent diarrhea; Nausea; Vomiting Medical History: Negative for: Cirrhosis ; Colitis; Crohns; Hepatitis A; Hepatitis B; Hepatitis C Past Medical History Notes: GERD Endocrine Complaints and Symptoms: Negative for: Heat/cold intolerance Medical History: Positive for: Type II Diabetes Negative for: Type I Diabetes Past Medical History Notes: Borderline diabetic Time with diabetes: 58 Treated with: Diet Genitourinary Complaints and Symptoms: Negative for: Frequent urination Medical History: Negative for: End Stage Renal Disease Integumentary (Skin) Complaints and Symptoms: Negative for: Wounds Medical History: Negative for: History of Burn Musculoskeletal Complaints and Symptoms: Negative for: Muscle Pain; Muscle Weakness Medical History: Negative for: Gout; Rheumatoid Arthritis; Osteoarthritis; Osteomyelitis Past Medical History Notes: arthritis Neurologic Complaints and Symptoms: Negative for: Numbness/parasthesias Medical History: Negative for: Dementia; Neuropathy; Quadriplegia; Paraplegia; Seizure Disorder Psychiatric Complaints and Symptoms: Negative for: Claustrophobia; Suicidal Medical History: Negative for: Anorexia/bulimia; Confinement Anxiety Past Medical History Notes: unspecified nonpsychotic mental disorder Hematologic/Lymphatic Medical History: Positive for: Anemia; Lymphedema Negative for: Hemophilia; Human Immunodeficiency Virus; Sickle Cell Disease Immunological Medical History: Negative for: Lupus Erythematosus; Raynauds; Scleroderma Oncologic Medical History: Negative for: Received Chemotherapy; Received Radiation Immunizations Pneumococcal Vaccine: Received Pneumococcal Vaccination: No Immunization Notes: Pt. unsure of last tetanus shot. Implantable Devices None Hospitalization / Surgery  History Type of Hospitalization/Surgery cholecystectomy tonsillectomy cervical spine surgery x2 partial hysterectomy lumbar disc surgery esophogastroduodenoscopy flexible sigmoidoscopy Family and Social History Cancer: No; Diabetes: Yes - Mother,Paternal Grandparents; Heart Disease: No; Hereditary Spherocytosis: No; Hypertension: No; Kidney Disease: No; Lung Disease: No; Seizures: No; Stroke: No; Thyroid Problems: No; Tuberculosis: No; Never smoker; Marital Status - Widowed; Alcohol Use: Never; Drug Use: No History; Caffeine Use: Daily - coffee; Financial Concerns: No; Food, Clothing or Shelter Needs: No; Support System Lacking: No; Transportation Concerns: No Electronic Signature(s) Signed: 03/04/2020 5:04:33 PM By: Carlene Coria RN Signed: 03/04/2020 5:30:42 PM By: Linton Ham MD Entered By: Carlene Coria on 03/04/2020 10:39:21 -------------------------------------------------------------------------------- SuperBill Details Patient Name: Date of Service: [name] RBA RA C. 03/04/2020 Medical Record Number: [id number] Patient Account Number: [id number] Date of Birth/Sex: Treating RN: [birth year] (86 y.o. Debby Bud Primary Care Provider: Sherrie Mustache Other Clinician: Referring Provider: Treating Provider/Extender: Deloria Lair in Treatment: 0 Diagnosis Coding ICD-10 Codes Code Description I89.0 Lymphedema, not elsewhere classified I50.20 Unspecified systolic (congestive) heart failure Facility Procedures CPT4 Code: [id number] Description: [id number] - WOUND CARE VISIT-LEV 3 EST PT Modifier: Quantity: 1 Physician Procedures Electronic Signature(s) Signed: 03/04/2020 5:30:42 PM By: Linton Ham MD Entered By: Linton Ham on 03/04/2020 12:06:53

## 2020-03-04 NOTE — Progress Notes (Signed)
CASSADY, BOYKE ([id number]) Visit Report for 03/04/2020 Abuse/Suicide Risk Screen Details Patient Name: Date of Service: [name], Sophia RA C. 03/04/2020 10:30 A M Medical Record Number: [id number] Patient Account Number: [id number] Date of Birth/Sex: Treating RN: 1931/12/23 (86 y.o. [name] Primary Care Taziah Difatta: Sherrie Mustache Other Clinician: Referring Lytle Malburg: Treating Nieves Chapa/Extender: Deloria Lair in Treatment: 0 Abuse/Suicide Risk Screen Items Answer ABUSE RISK SCREEN: Has anyone close to you tried to hurt or harm you recentlyo No Do you feel uncomfortable with anyone in your familyo No Has anyone forced you do things that you didnt want to doo No Electronic Signature(s) Signed: 03/04/2020 5:04:33 PM By: Carlene Coria RN Entered By: Carlene Coria on 03/04/2020 10:39:29 -------------------------------------------------------------------------------- Activities of Daily Living Details Patient Name: Date of Service: [name], Sophia RA C. 03/04/2020 10:30 A M Medical Record Number: [id number] Patient Account Number: [id number] Date of Birth/Sex: Treating RN: 1932/10/29 (86 y.o. [name] Primary Care Waris Rodger: Sherrie Mustache Other Clinician: Referring Brooks Stotz: Treating Lyndol Vanderheiden/Extender: Deloria Lair in Treatment: 0 Activities of Daily Living Items Answer Activities of Daily Living (Please select one for each item) Drive Automobile Not Able T Medications ake Need Assistance Use T elephone Completely Able Care for Appearance Completely Able Use T oilet Completely Able Bath / Shower Completely Able Dress Self Completely Able Feed Self Completely Able Walk Completely Able Get In / Out Bed Completely Able Housework Completely Able Prepare Meals Completely Able Handle Money Completely Able Shop for Self Completely Able Electronic Signature(s) Signed: 03/04/2020 5:04:33 PM By: Carlene Coria RN Entered By: Carlene Coria on 03/04/2020 10:40:12 -------------------------------------------------------------------------------- Education Screening Details Patient Name: Date of Service: Sophia Sack RBA RA C. 03/04/2020 10:30 A M Medical Record Number: [id number] Patient Account Number: [id number] Date of Birth/Sex: Treating RN: 1932/10/05 (87 y.o. [name] Primary Care Treena Cosman: Sherrie Mustache Other Clinician: Referring Cowan Pilar: Treating Iyani Dresner/Extender: Deloria Lair in Treatment: 0 Learning Preferences/Education Level/Primary Language Learning Preference: Explanation Highest Education Level: College or Above Preferred Language: English Cognitive Barrier Language Barrier: No Translator Needed: No Memory Deficit: No Emotional Barrier: No Cultural/Religious Beliefs Affecting Medical Care: No Physical Barrier Impaired Vision: No Impaired Hearing: No Decreased Hand dexterity: No Knowledge/Comprehension Knowledge Level: Medium Comprehension Level: High Ability to understand written instructions: High Ability to understand verbal instructions: High Motivation Anxiety Level: Calm Cooperation: Cooperative Education Importance: Acknowledges Need Interest in Health Problems: Asks Questions Perception: Coherent Willingness to Engage in Self-Management High Activities: Readiness to Engage in Self-Management High Activities: Electronic Signature(s) Signed: 03/04/2020 5:04:33 PM By: Carlene Coria RN Entered By: Carlene Coria on 03/04/2020 10:40:55 -------------------------------------------------------------------------------- Fall Risk Assessment Details Patient Name: Date of Service: Sophia Sack RBA RA C. 03/04/2020 10:30 A M Medical Record Number: [id number] Patient Account Number: [id number] Date of Birth/Sex: Treating RN: February 03, 1932 (86 y.o. [name] Primary Care Latesha Chesney: Sherrie Mustache Other Clinician: Referring Clydie Dillen: Treating Harlowe Dowler/Extender:  Deloria Lair in Treatment: 0 Fall Risk Assessment Items Have you had 2 or more falls in the last 12 monthso 0 No Have you had any fall that resulted in injury in the last 12 monthso 0 No FALLS RISK SCREEN History of falling - immediate or within 3 months 0 No Secondary diagnosis (Do you have 2 or more medical diagnoseso) 0 No Ambulatory aid None/bed rest/wheelchair/nurse 0 No Crutches/cane/walker 0 No Furniture 0 No Intravenous therapy Access/Saline/Heparin Lock 0 No Gait/Transferring Normal/ bed rest/ wheelchair 0 No Weak (short steps with or  without shuffle, stooped but able to lift head while walking, may seek 0 No support from furniture) Impaired (short steps with shuffle, may have difficulty arising from chair, head down, impaired 0 No balance) Mental Status Oriented to own ability 0 No Electronic Signature(s) Signed: 03/04/2020 5:04:33 PM By: Carlene Coria RN Entered By: Carlene Coria on 03/04/2020 10:41:02 -------------------------------------------------------------------------------- Foot Assessment Details Patient Name: Date of Service: Sophia Sack RBA RA C. 03/04/2020 10:30 A M Medical Record Number: [id number] Patient Account Number: [id number] Date of Birth/Sex: Treating RN: 1932/03/12 (86 y.o. [name] Primary Care Kylii Ennis: Sherrie Mustache Other Clinician: Referring Tyrena Gohr: Treating Rylann Munford/Extender: Deloria Lair in Treatment: 0 Foot Assessment Items Site Locations + = Sensation present, - = Sensation absent, C = Callus, U = Ulcer R = Redness, W = Warmth, M = Maceration, PU = Pre-ulcerative lesion F = Fissure, S = Swelling, D = Dryness Assessment Right: Left: Other Deformity: No No Prior Foot Ulcer: No No Prior Amputation: No No Charcot Joint: No No Ambulatory Status: Ambulatory Without Help Gait: Steady Electronic Signature(s) Signed: 03/04/2020 5:04:33 PM By: Carlene Coria RN Entered By: Carlene Coria on 03/04/2020 10:44:03 -------------------------------------------------------------------------------- Nutrition Risk Screening Details Patient Name: Date of Service: [name], Sophia RA C. 03/04/2020 10:30 A M Medical Record Number: [id number] Patient Account Number: [id number] Date of Birth/Sex: Treating RN: [birth year] (86 y.o. [name] Primary Care Madinah Quarry: Sherrie Mustache Other Clinician: Referring Ragan Reale: Treating Yeimy Brabant/Extender: Deloria Lair in Treatment: 0 Height (in): 59 Weight (lbs): 124 Body Mass Index (BMI): 25 Nutrition Risk Screening Items Score Screening NUTRITION RISK SCREEN: I have an illness or condition that made me change the kind and/or amount of food I eat 0 No I eat fewer than two meals per day 0 No I eat few fruits and vegetables, or milk products 0 No I have three or more drinks of beer, liquor or wine almost every day 0 No I have tooth or mouth problems that make it hard for me to eat 0 No I don't always have enough money to buy the food I need 0 No I eat alone most of the time 0 No I take three or more different prescribed or over-the-counter drugs a day 1 Yes Without wanting to, I have lost or gained 10 pounds in the last six months 0 No I am not always physically able to shop, cook and/or feed myself 0 No Nutrition Protocols Good Risk Protocol 0 No interventions needed Moderate Risk Protocol High Risk Proctocol Risk Level: Good Risk Score: 1 Electronic Signature(s) Signed: 03/04/2020 5:04:33 PM By: Carlene Coria RN Entered By: Carlene Coria on 03/04/2020 10:41:17

## 2020-03-22 NOTE — Progress Notes (Signed)
DENEISE, [name] ([id number]) Visit Report for 03/04/2020 Allergy List Details Patient Name: Date of Service: [name], [name] [name], [name] Sophia C. 03/04/2020 10:30 Sophia M Daniel Record Number: [id number] Patient Account Number: [id number] Date of Birth/Sex: Treating RN: 1932/06/02 (86 y.o. [name] Primary Care Charla Criscione: Sherrie Mustache Other Clinician: Referring Lesha Jager: Treating Idona Stach/Extender: Deloria Lair in Treatment: 0 Allergies Active Allergies aspirin Reaction: upset stomach Severity: Moderate codeine Reaction: "makes by body go crazy; cramps" Severity: Moderate latex Reaction: nausea and vomiting Severity: Moderate Allergy Notes Electronic Signature(s) Signed: 03/04/2020 5:04:33 PM By: Carlene Coria RN Entered By: Carlene Coria on 03/04/2020 10:38:12 -------------------------------------------------------------------------------- Arrival Information Details Patient Name: Date of Service: Sophia Sack RBA Sophia C. 03/04/2020 10:30 Sophia M Daniel Record Number: [id number] Patient Account Number: [id number] Date of Birth/Sex: Treating RN: Jan 29, 1932 (86 y.o. [name] Primary Care Marysol Wellnitz: Sherrie Mustache Other Clinician: Referring Goldie Dimmer: Treating Emmerie Battaglia/Extender: Deloria Lair in Treatment: 0 Visit Information Patient Arrived: Ambulatory Arrival Time: 10:34 Accompanied By: self Transfer Assistance: None Patient Identification Verified: Yes Secondary Verification Process Completed: Yes Patient Requires Transmission-Based Precautions: No Patient Has Alerts: No History Since Last Visit All ordered tests and consults were completed: No Added or deleted any medications: No Any new allergies or adverse reactions: No Had Sophia fall or experienced change in activities of daily living that may affect risk of falls: No Signs or symptoms of abuse/neglect since last visito No Hospitalized since last visit: No Implantable device outside  of the clinic excluding cellular tissue based products placed in the center since last visit: No Pain Present Now: No Electronic Signature(s) Signed: 03/04/2020 5:04:33 PM By: Carlene Coria RN Entered By: Carlene Coria on 03/04/2020 10:35:16 -------------------------------------------------------------------------------- Clinic Level of Care Assessment Details Patient Name: Date of Service: [name], [name] [facility] Sophia C. 03/04/2020 10:30 Sophia M Daniel Record Number: [id number] Patient Account Number: [id number] Date of Birth/Sex: Treating RN: [birth year] (86 y.o. [name] Primary Care Leana Springston: Sherrie Mustache Other Clinician: Referring Tywaun Hiltner: Treating Alilah Mcmeans/Extender: Deloria Lair in Treatment: 0 Clinic Level of Care Assessment Items TOOL 2 Quantity Score X- 1 0 Use when only an EandM is performed on the INITIAL visit ASSESSMENTS - Nursing Assessment / Reassessment X- 1 20 General Physical Exam (combine w/ comprehensive assessment (listed just below) when performed on new pt. evals) X- 1 25 Comprehensive Assessment (HX, ROS, Risk Assessments, Wounds Hx, etc.) ASSESSMENTS - Wound and Skin Sophia ssessment / Reassessment []  - 0 Simple Wound Assessment / Reassessment - one wound []  - 0 Complex Wound Assessment / Reassessment - multiple wounds X- 1 10 Dermatologic / Skin Assessment (not related to wound area) ASSESSMENTS - Ostomy and/or Continence Assessment and Care []  - 0 Incontinence Assessment and Management []  - 0 Ostomy Care Assessment and Management (repouching, etc.) PROCESS - Coordination of Care X - Simple Patient / Family Education for ongoing care 1 15 []  - 0 Complex (extensive) Patient / Family Education for ongoing care X- 1 10 Staff obtains Programmer, systems, Records, T Results / Process Orders est []  - 0 Staff telephones HHA, Nursing Homes / Clarify orders / etc []  - 0 Routine Transfer to another Facility (non-emergent condition) []  - 0 Routine  Hospital Admission (non-emergent condition) []  - 0 New Admissions / Biomedical engineer / Ordering NPWT Apligraf, etc. , []  - 0 Emergency Hospital Admission (emergent condition) X- 1 10 Simple Discharge Coordination []  - 0 Complex (extensive) Discharge Coordination PROCESS - Special Needs []  - 0 Pediatric /  Minor Patient Management []  - 0 Isolation Patient Management []  - 0 Hearing / Language / Visual special needs []  - 0 Assessment of Community assistance (transportation, D/C planning, etc.) []  - 0 Additional assistance / Altered mentation []  - 0 Support Surface(s) Assessment (bed, cushion, seat, etc.) INTERVENTIONS - Wound Cleansing / Measurement []  - 0 Wound Imaging (photographs - any number of wounds) []  - 0 Wound Tracing (instead of photographs) []  - 0 Simple Wound Measurement - one wound []  - 0 Complex Wound Measurement - multiple wounds []  - 0 Simple Wound Cleansing - one wound []  - 0 Complex Wound Cleansing - multiple wounds INTERVENTIONS - Wound Dressings []  - 0 Small Wound Dressing one or multiple wounds []  - 0 Medium Wound Dressing one or multiple wounds []  - 0 Large Wound Dressing one or multiple wounds []  - 0 Application of Medications - injection INTERVENTIONS - Miscellaneous []  - 0 External ear exam []  - 0 Specimen Collection (cultures, biopsies, blood, body fluids, etc.) []  - 0 Specimen(s) / Culture(s) sent or taken to Lab for analysis []  - 0 Patient Transfer (multiple staff / Civil Service fast streamer / Similar devices) []  - 0 Simple Staple / Suture removal (25 or less) []  - 0 Complex Staple / Suture removal (26 or more) []  - 0 Hypo / Hyperglycemic Management (close monitor of Blood Glucose) []  - 0 Ankle / Brachial Index (ABI) - do not check if billed separately Has the patient been seen at the hospital within the last three years: Yes Total Score: 90 Level Of Care: New/Established - Level 3 Electronic Signature(s) Signed: 03/04/2020 5:34:26 PM  By: Deon Pilling Entered By: Deon Pilling on 03/04/2020 10:57:25 -------------------------------------------------------------------------------- Encounter Discharge Information Details Patient Name: Date of Service: Sophia Sack RBA Sophia C. 03/04/2020 10:30 Sophia M Daniel Record Number: [id number] Patient Account Number: [id number] Date of Birth/Sex: Treating RN: 1931/11/05 (86 y.o. [name] Primary Care Loyd Salvador: Sherrie Mustache Other Clinician: Referring Hildred Mollica: Treating Makaela Cando/Extender: Deloria Lair in Treatment: 0 Encounter Discharge Information Items Discharge Condition: Stable Ambulatory Status: Ambulatory Discharge Destination: Home Transportation: Private Auto Accompanied By: nephew Schedule Follow-up Appointment: No Clinical Summary of Care: Electronic Signature(s) Signed: 03/04/2020 5:34:26 PM By: Deon Pilling Entered By: Deon Pilling on 03/04/2020 11:23:22 -------------------------------------------------------------------------------- [place] Details Patient Name: Date of Service: Sophia Sack RBA Sophia C. 03/04/2020 10:30 Sophia M Daniel Record Number: [id number] Patient Account Number: [id number] Date of Birth/Sex: Treating RN: 1932-01-29 (86 y.o. [name] Primary Care [place] Hagger: Sherrie Mustache Other Clinician: Referring Annalese Stiner: Treating Lizzie Cokley/Extender: Deloria Lair in Treatment: 0 Active Inactive Electronic Signature(s) Signed: 03/04/2020 5:34:26 PM By: Deon Pilling Signed: 03/22/2020 1:34:25 PM By: Deon Pilling Entered By: Deon Pilling on 03/04/2020 10:56:51 -------------------------------------------------------------------------------- Pain Assessment Details Patient Name: Date of Service: Sophia Sack RBA Sophia C. 03/04/2020 10:30 Sophia M Daniel Record Number: [id number] Patient Account Number: [id number] Date of Birth/Sex: Treating RN: 1932-09-23 (86 y.o. [name] Primary  Care Lakhia Gengler: Sherrie Mustache Other Clinician: Referring Rielly Corlett: Treating Alhaji Mcneal/Extender: Deloria Lair in Treatment: 0 Active Problems Location of Pain Severity and Description of Pain Patient Has Paino No Site Locations Pain Management and Medication Current Pain Management: Electronic Signature(s) Signed: 03/04/2020 5:04:33 PM By: Carlene Coria RN Entered By: Carlene Coria on 03/04/2020 10:44:24 -------------------------------------------------------------------------------- Patient/Caregiver Education Details Patient Name: Date of Service: Carren Rang 5/6/2021andnbsp10:30 Sophia M Daniel Record Number: [id number] Patient Account Number: [id number] Date of Birth/Gender: Treating RN: [birth year] (86 y.o. F)  Deon Pilling Primary Care Physician: Sherrie Mustache Other Clinician: Referring Physician: Treating Physician/Extender: Deloria Lair in Treatment: 0 Education Assessment Education Provided To: Patient Education Topics Provided Wound/Skin Impairment: Handouts: Skin Care Do's and Dont's Methods: Explain/Verbal Responses: Reinforcements needed Electronic Signature(s) Signed: 03/04/2020 5:34:26 PM By: Deon Pilling Entered By: Deon Pilling on 03/04/2020 10:56:59 -------------------------------------------------------------------------------- Vitals Details Patient Name: Date of Service: Sophia Sack RBA Sophia C. 03/04/2020 10:30 Sophia M Daniel Record Number: [id number] Patient Account Number: [id number] Date of Birth/Sex: Treating RN: [birth year] (86 y.o. [name] Primary Care [place] Zaragoza: Sherrie Mustache Other Clinician: Referring Adena Sima: Treating Yaiden Yang/Extender: Deloria Lair in Treatment: 0 Vital Signs Time Taken: 10:35 Temperature (F): 98.4 Height (in): 59 Pulse (bpm): 69 Source: Stated Respiratory Rate (breaths/min): 18 Weight (lbs): 124 Blood Pressure (mmHg):  127/72 Source: Stated Reference Range: 80 - 120 mg / dl Body Mass Index (BMI): 25 Electronic Signature(s) Signed: 03/04/2020 5:04:33 PM By: Carlene Coria RN Entered By: Carlene Coria on 03/04/2020 10:37:33

## 2020-04-16 ENCOUNTER — Ambulatory Visit: Payer: Medicare Other | Admitting: Nurse Practitioner

## 2020-04-19 ENCOUNTER — Ambulatory Visit: Payer: Medicare Other | Admitting: Nurse Practitioner

## 2020-04-28 ENCOUNTER — Ambulatory Visit: Payer: Self-pay | Admitting: Nurse Practitioner

## 2020-05-04 ENCOUNTER — Telehealth: Payer: Self-pay

## 2020-05-04 DIAGNOSIS — J449 Chronic obstructive pulmonary disease, unspecified: Secondary | ICD-10-CM

## 2020-05-04 DIAGNOSIS — E559 Vitamin D deficiency, unspecified: Secondary | ICD-10-CM

## 2020-05-04 DIAGNOSIS — D509 Iron deficiency anemia, unspecified: Secondary | ICD-10-CM

## 2020-05-04 DIAGNOSIS — I5022 Chronic systolic (congestive) heart failure: Secondary | ICD-10-CM

## 2020-05-04 DIAGNOSIS — J4489 Other specified chronic obstructive pulmonary disease: Secondary | ICD-10-CM

## 2020-05-04 DIAGNOSIS — F22 Delusional disorders: Secondary | ICD-10-CM

## 2020-05-04 DIAGNOSIS — L299 Pruritus, unspecified: Secondary | ICD-10-CM

## 2020-05-04 DIAGNOSIS — I890 Lymphedema, not elsewhere classified: Secondary | ICD-10-CM

## 2020-05-04 DIAGNOSIS — I10 Essential (primary) hypertension: Secondary | ICD-10-CM

## 2020-05-04 MED ORDER — ADVAIR HFA 115-21 MCG/ACT IN AERO: INHALATION_SPRAY | RESPIRATORY_TRACT | 5 refills | Status: DC

## 2020-05-04 MED ORDER — CETIRIZINE HCL 10 MG PO TABS: 10.0000 mg | ORAL_TABLET | Freq: Every day | ORAL | 1 refills | Status: DC

## 2020-05-04 MED ORDER — VITAMIN D (ERGOCALCIFEROL) 1.25 MG (50000 UNIT) PO CAPS: 50000.0000 [IU] | ORAL_CAPSULE | ORAL | 1 refills | Status: DC

## 2020-05-04 MED ORDER — CARVEDILOL 6.25 MG PO TABS: ORAL_TABLET | ORAL | 1 refills | Status: DC

## 2020-05-04 MED ORDER — ALBUTEROL SULFATE HFA 108 (90 BASE) MCG/ACT IN AERS: INHALATION_SPRAY | RESPIRATORY_TRACT | 5 refills | Status: DC

## 2020-05-04 MED ORDER — OLANZAPINE 5 MG PO TABS: 5.0000 mg | ORAL_TABLET | Freq: Every day | ORAL | 1 refills | Status: DC

## 2020-05-04 MED ORDER — TORSEMIDE 20 MG PO TABS: 40.0000 mg | ORAL_TABLET | Freq: Two times a day (BID) | ORAL | 1 refills | Status: DC

## 2020-05-04 MED ORDER — FERROUS SULFATE DRIED 200 (65 FE) MG PO TABS: 1.0000 | ORAL_TABLET | Freq: Every day | ORAL | 1 refills | Status: DC

## 2020-05-04 MED ORDER — HYDRALAZINE HCL 25 MG PO TABS: ORAL_TABLET | ORAL | 1 refills | Status: DC

## 2020-05-04 NOTE — Telephone Encounter (Signed)
Clinical Intake Walk In form was completed by patients nephew requesting refill for all medications except potassium    RX's filled as requested.  I left message on voicemail informing [name] rx's sent

## 2020-05-06 ENCOUNTER — Encounter: Payer: Medicare Other | Admitting: Nurse Practitioner

## 2020-05-07 ENCOUNTER — Encounter: Payer: Medicare Other | Admitting: Nurse Practitioner

## 2020-05-08 ENCOUNTER — Other Ambulatory Visit: Payer: Self-pay | Admitting: Nurse Practitioner

## 2020-05-08 DIAGNOSIS — D509 Iron deficiency anemia, unspecified: Secondary | ICD-10-CM

## 2020-05-20 ENCOUNTER — Encounter: Payer: Medicare Other | Admitting: Nurse Practitioner

## 2020-05-21 ENCOUNTER — Ambulatory Visit (INDEPENDENT_AMBULATORY_CARE_PROVIDER_SITE_OTHER): Payer: Medicare Other | Admitting: Family

## 2020-05-21 ENCOUNTER — Encounter: Payer: Self-pay | Admitting: Family

## 2020-05-21 ENCOUNTER — Other Ambulatory Visit: Payer: Self-pay

## 2020-05-21 VITALS — BP 120/82 | HR 64 | Temp 98.2°F | Resp 16 | Ht <= 58 in | Wt 136.0 lb

## 2020-05-21 DIAGNOSIS — L602 Onychogryphosis: Secondary | ICD-10-CM

## 2020-05-21 DIAGNOSIS — B353 Tinea pedis: Secondary | ICD-10-CM | POA: Diagnosis not present

## 2020-05-21 DIAGNOSIS — I890 Lymphedema, not elsewhere classified: Secondary | ICD-10-CM | POA: Diagnosis not present

## 2020-05-21 DIAGNOSIS — G8929 Other chronic pain: Secondary | ICD-10-CM | POA: Diagnosis not present

## 2020-05-21 DIAGNOSIS — L859 Epidermal thickening, unspecified: Secondary | ICD-10-CM

## 2020-05-21 DIAGNOSIS — M2550 Pain in unspecified joint: Secondary | ICD-10-CM

## 2020-05-21 DIAGNOSIS — R238 Other skin changes: Secondary | ICD-10-CM

## 2020-05-21 MED ORDER — TRIAMCINOLONE ACETONIDE 0.1 % EX CREA: 1.0000 "application " | TOPICAL_CREAM | Freq: Two times a day (BID) | CUTANEOUS | 0 refills | Status: DC

## 2020-05-21 NOTE — Patient Instructions (Signed)
Follow up with Lymphedema clinic referral placed today clinic will call you to make appointment - Apply Moisturizing cream to both leg on irritated areas daily  - Notify provider for any fever,chills or drainage from legs

## 2020-05-21 NOTE — Progress Notes (Signed)
Provider: Byrl Latin FNP-C  Lauree Chandler, NP  Patient Care Team: Lauree Chandler, NP as PCP - General (Geriatric Medicine) [name], MD (Pulmonary Disease) Jacolyn Reedy, MD as Consulting Physician (Cardiology)  Extended Emergency Contact Information Primary Emergency Contact: Marlene Bast Address: [address]          [place], [place] Phone: [phone] Relation: Alanson Puls Secondary Emergency Contact: Phelps,Rachel  [place] Phone: [phone] Relation: Niece  Code Status:  DNR Goals of care: Advanced Directive information Advanced Directives 05/21/2020  Does Patient Have a Medical Advance Directive? Yes  Type of Advance Directive -  Does patient want to make changes to medical advance directive? No - Patient declined  Copy of [place] in Chart? -  Would patient like information on creating a medical advance directive? -     Chief Complaint  Patient presents with  . Acute Visit    Needs both legs checked for infection.    HPI:  Pt is a 86 y.o. female seen today for an acute visit for evaluation of bilateral leg redness.she is here with her daughter.she has a significant medical history of Lymphedema left greater than the right.Daughter states has been soaking patient's feet in Epsom salt.States has been able to scrap off dry areas on the legs but has noticed some redness and irritation wonders whether it's an infection.No drainage noted.shedenies any fever or chills. Daughter wraps legs with 2 layers of Kerlix dressing.     Past Medical History:  Diagnosis Date  . Anemia, unspecified   . Anxiety   . Arthritis   . Asthma   . Delusions ([place])   . Depression   . Edema 2005   "since put the glass in them"   . GERD (gastroesophageal reflux disease)   . Lumbago   . Lymphedema   . Obstructive chronic bronchitis without exacerbation ([place])   . Other abnormal glucose   .  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 Systolic heart failure ([facility])    EF 35 to 40% per echo January 2015  . Unspecified essential hypertension   . Unspecified nonpsychotic mental disorder   . Unspecified venous (peripheral) insufficiency   . Unspecified venous (peripheral) insufficiency   . Unspecified vitamin D deficiency    Past Surgical History:  Procedure Laterality Date  . APPENDECTOMY    . CERVICAL SPINE SURGERY     x 2  . CHOLECYSTECTOMY    . ESOPHAGOGASTRODUODENOSCOPY N/A 04/05/2015   Procedure: ESOPHAGOGASTRODUODENOSCOPY (EGD);  Surgeon: [name], MD;  Location: [facility];  Service: Endoscopy;  Laterality: N/A;  . FLEXIBLE SIGMOIDOSCOPY N/A 04/05/2015   Procedure: FLEXIBLE SIGMOIDOSCOPY;  Surgeon: [name], MD;  Location: [facility];  Service: Endoscopy;  Laterality: N/A;  . HAND SURGERY    . LUMBAR DISC SURGERY    . PARTIAL HYSTERECTOMY    . TONSILLECTOMY      Allergies  Allergen Reactions  . Aspirin Other (See Comments)    REACTION: upset stomach  . Codeine Other (See Comments)    REACTION: "MAKES MY BODY GO CRAZY; CRAMPS"  . Latex Nausea And Vomiting    Outpatient Encounter Medications as of 05/21/2020  Medication Sig  . acetaminophen (TYLENOL 8 HOUR) 650 MG CR tablet Take 1 tablet (650 mg total) by mouth 3 (three) times daily with meals.  [name] albuterol (PROAIR HFA) 108 (90 Base) MCG/ACT inhaler INHALE 2 PUFFS EVERY 4 HOURS AS NEEDED  FOR WHEEZING  . Ascorbic Acid (VITAMIN C) 1000 MG tablet 2 by mouth twice daily before lunch and supper   . calcium-vitamin D (OSCAL WITH D) 500-200 MG-UNIT tablet Take 1 tablet by mouth daily with breakfast.  . carvedilol (COREG) 6.25 MG tablet Take 1 tablet twice daily in lunch and supper.  . cetirizine (ZYRTEC) 10 MG tablet Take 1 tablet (10 mg total) by mouth daily.  [name] denosumab (PROLIA) 60 MG/ML SOSY injection Inject 60 mg into the skin every 6 (six) months.  . Ferrous Sulfate Dried 200 (65 Fe) MG TABS Take 1 tablet  by mouth daily.  . fluticasone-salmeterol (ADVAIR HFA) 115-21 MCG/ACT inhaler INHALE 2 PUFFS BY MOUTH TWICE DAILY  . hydrALAZINE (APRESOLINE) 25 MG tablet TAKE 1 TABLET(25 MG TOTAL) BY MOUTH TWICE DAILY AT 8 AM AND 10 PM  . OLANZapine (ZYPREXA) 5 MG tablet Take 1 tablet (5 mg total) by mouth at bedtime.  . potassium chloride SA (KLOR-CON) 20 MEQ tablet Take 2 tablets (40 mEq total) by mouth 2 (two) times daily before lunch and supper.  . torsemide (DEMADEX) 20 MG tablet Take 2 tablets (40 mg total) by mouth 2 (two) times daily. Take 2 tabs at 8 am and 10 pm  . Vitamin D, Ergocalciferol, (DRISDOL) 1.25 MG (50000 UNIT) CAPS capsule Take 1 capsule (50,000 Units total) by mouth every Monday.  . FEROSUL 325 (65 Fe) MG tablet Take 325 mg by mouth daily.  [name] triamcinolone cream (KENALOG) 0.1 % Apply 1 application topically 2 (two) times daily.   No facility-administered encounter medications on file as of 05/21/2020.    Review of Systems  Constitutional: Negative for appetite change, chills and fatigue.  Respiratory: Negative for cough, chest tightness, shortness of breath and wheezing.   Cardiovascular: Positive for leg swelling. Negative for chest pain and palpitations.  Gastrointestinal: Negative for abdominal distention, abdominal pain, constipation, diarrhea, nausea and vomiting.  Musculoskeletal: Positive for gait problem. Negative for joint swelling and myalgias.  Skin: Negative for color change, pallor, rash and wound.       Irritated skin to both legs  Neurological: Negative for dizziness, speech difficulty, light-headedness and headaches.  Psychiatric/Behavioral: Negative for agitation and sleep disturbance. The patient is not nervous/anxious.     Immunization History  Administered Date(s) Administered  . Fluad Quad(high Dose 65+) 06/24/2019  . Influenza Split 07/31/2011  . Influenza Whole 09/30/2009, 07/30/2010  . Influenza,inj,Quad PF,6+ Mos 08/13/2015, 07/03/2017, 08/16/2018  .  Influenza-Unspecified 07/30/2016  . Pneumococcal Conjugate-13 11/24/2016  . Pneumococcal Polysaccharide-23 08/16/2018  . Tdap 04/07/2015  . Varicella 08/25/2011   Pertinent  Health Maintenance Due  Topic Date Due  . COLONOSCOPY  08/25/2015  . INFLUENZA VACCINE  05/30/2020  . DEXA SCAN  Completed  . PNA vac Low Risk Adult  Completed   Fall Risk  05/21/2020 10/17/2019 07/24/2019 06/09/2019 05/05/2019  Falls in the past year? 0 0 0 0 0  Number falls in past yr: 0 0 0 0 0  Injury with Fall? 0 0 0 0 0  Comment - - - - -  Risk Factor Category  - - - - -  Risk for fall due to : - - - - -  Risk for fall due to: Comment - - - - -  Follow up - - - - -   Functional Status Survey:    Vitals:   05/21/20 1323  BP: 120/82  Pulse: 64  Resp: 16  Temp: 98.2 F (36.8 C)  SpO2:  93%  Weight: 136 lb (61.7 kg)  Height: 4\' 10"  (1.473 m)   Body mass index is 28.42 kg/m. Physical Exam Vitals reviewed.  Constitutional:      General: She is not in acute distress.    Appearance: She is overweight. She is not ill-appearing.  Eyes:     General: No scleral icterus.       Right eye: No discharge.        Left eye: No discharge.     Conjunctiva/sclera: Conjunctivae normal.     Pupils: Pupils are equal, round, and reactive to light.  Cardiovascular:     Rate and Rhythm: Normal rate.     Pulses: Normal pulses.     Heart sounds: Normal heart sounds. No murmur heard.  No friction rub. No gallop.   Pulmonary:     Effort: Pulmonary effort is normal. No respiratory distress.     Breath sounds: Normal breath sounds. No wheezing, rhonchi or rales.  Chest:     Chest wall: No tenderness.  Abdominal:     General: Bowel sounds are normal. There is no distension.     Palpations: Abdomen is soft. There is no mass.     Tenderness: There is no abdominal tenderness. There is no right CVA tenderness, left CVA tenderness, guarding or rebound.  Musculoskeletal:        General: No tenderness.     Right lower  leg: Edema present.     Left lower leg: Edema present.     Comments: Unsteady gait.bilateral lower extremities edema left greater than the right   Feet:     Right foot:     Toenail Condition: Fungal disease present.    Left foot:     Toenail Condition: Left toenails are abnormally thick and long. Fungal disease present. Skin:    General: Skin is warm.     Coloration: Skin is not pale.     Findings: No erythema or rash.     Comments: Bilateral lower extremities dry skin with brownish with shade of black skin discoloration.skin irritation to multiple areas on dorsal feet to ankle areas.   Neurological:     Mental Status: She is alert. Mental status is at baseline.     Cranial Nerves: No cranial nerve deficit.     Motor: No weakness.     Gait: Gait abnormal.  Psychiatric:        Mood and Affect: Mood normal.        Behavior: Behavior normal.        Thought Content: Thought content normal.     Labs reviewed: Recent Labs    06/09/19 1605 10/17/19 1549  NA 137 140  K 3.8 3.8  CL 104 106  CO2 28 26  GLUCOSE 126 95  BUN 20 10  CREATININE 1.34* 1.08*  CALCIUM 9.3 9.5   Recent Labs    06/09/19 1605  AST 16  ALT 10  BILITOT 0.4  PROT 8.8*   Recent Labs    06/09/19 1605 07/03/19 1513 10/17/19 1549  WBC 5.1 6.5 5.9  NEUTROABS 3,453 4,745 4,083  HGB 10.2* 10.0* 9.5*  HCT 32.5* 32.2* 31.0*  MCV 85.3 85.9 85.6  PLT 229 322 305   Lab Results  Component Value Date   TSH 1.11 11/24/2016   Lab Results  Component Value Date   HGBA1C 5.5 04/14/2014   Lab Results  Component Value Date   CHOL 207 (H) 11/24/2016   HDL 81 11/24/2016   [place]  111 (H) 11/24/2016   TRIG 73 11/24/2016   CHOLHDL 2.6 11/24/2016    Significant Diagnostic Results in last 30 days:  No results found.  Assessment/Plan 1. Lymphedema of both lower extremities Chronic.left > right .continue with leg wraps daily.will refer to Lymphedema clinic.  - Ambulatory referral to Physical  Therapy  2. Skin irritation Daughter soaked both feet and ankles in Epsom salt and scraped dry skin off now has some irritation.no signs of infection.cleansed and covered with Xeroform,secured with Kerlix wrap.   - Advised to apply moisturize daily to irritated areas and monitor for signs of infection.   3. Overgrown toenails Has upcoming appointment with podiatrist to trim toenails.   4. Hyperkeratosis Advised daughter to apply Triamcinolone cream twice daily.  - triamcinolone cream (KENALOG) 0.1 %; Apply 1 application topically 2 (two) times daily.  Dispense: 30 g; Refill: 0  5. Chronic pain of multiple joints Requires a bariatric over the toilet seat chair to assist in getting in and out of the toilet.Script written for DME and given to daughter to take to medical supply. - For home use only DME Other see comment 6.Tinea pedis Has lotrimin cream.folow up with podiatrist.Advised to keep toe webs dry.    Family/ staff Communication: Reviewed plan of care with patient and daughter verbalized understanding.   Labs/tests ordered: None   Next Appointment: As needed if symptoms worsen or fail to improve.  Sandrea Hughs, NP

## 2020-05-24 ENCOUNTER — Ambulatory Visit (INDEPENDENT_AMBULATORY_CARE_PROVIDER_SITE_OTHER): Payer: Medicare Other | Admitting: Nurse Practitioner

## 2020-05-24 ENCOUNTER — Other Ambulatory Visit: Payer: Self-pay

## 2020-05-24 ENCOUNTER — Encounter: Payer: Self-pay | Admitting: Nurse Practitioner

## 2020-05-24 VITALS — BP 138/90 | HR 66 | Temp 96.9°F | Ht <= 58 in | Wt 139.0 lb

## 2020-05-24 DIAGNOSIS — E2839 Other primary ovarian failure: Secondary | ICD-10-CM | POA: Diagnosis not present

## 2020-05-24 DIAGNOSIS — R1013 Epigastric pain: Secondary | ICD-10-CM | POA: Diagnosis not present

## 2020-05-24 DIAGNOSIS — I10 Essential (primary) hypertension: Secondary | ICD-10-CM | POA: Diagnosis not present

## 2020-05-24 DIAGNOSIS — I890 Lymphedema, not elsewhere classified: Secondary | ICD-10-CM | POA: Diagnosis not present

## 2020-05-24 DIAGNOSIS — J4489 Other specified chronic obstructive pulmonary disease: Secondary | ICD-10-CM

## 2020-05-24 DIAGNOSIS — F22 Delusional disorders: Secondary | ICD-10-CM

## 2020-05-24 DIAGNOSIS — I5022 Chronic systolic (congestive) heart failure: Secondary | ICD-10-CM | POA: Diagnosis not present

## 2020-05-24 DIAGNOSIS — M810 Age-related osteoporosis without current pathological fracture: Secondary | ICD-10-CM

## 2020-05-24 DIAGNOSIS — J449 Chronic obstructive pulmonary disease, unspecified: Secondary | ICD-10-CM

## 2020-05-24 DIAGNOSIS — E559 Vitamin D deficiency, unspecified: Secondary | ICD-10-CM | POA: Diagnosis not present

## 2020-05-24 DIAGNOSIS — R1084 Generalized abdominal pain: Secondary | ICD-10-CM | POA: Diagnosis not present

## 2020-05-24 DIAGNOSIS — D509 Iron deficiency anemia, unspecified: Secondary | ICD-10-CM

## 2020-05-24 DIAGNOSIS — L859 Epidermal thickening, unspecified: Secondary | ICD-10-CM

## 2020-05-24 LAB — CBC WITH DIFFERENTIAL/PLATELET
Absolute Monocytes: 409 cells/uL (ref 200–950)
Basophils Absolute: 22 cells/uL (ref 0–200)
Basophils Relative: 0.4 %
Eosinophils Absolute: 0 cells/uL — ABNORMAL LOW (ref 15–500)
Eosinophils Relative: 0 %
HCT: 30.9 % — ABNORMAL LOW (ref 35.0–45.0)
Hemoglobin: 9.6 g/dL — ABNORMAL LOW (ref 11.7–15.5)
Lymphs Abs: 1120 cells/uL (ref 850–3900)
MCH: 27 pg (ref 27.0–33.0)
MCHC: 31.1 g/dL — ABNORMAL LOW (ref 32.0–36.0)
MCV: 87 fL (ref 80.0–100.0)
MPV: 10.2 fL (ref 7.5–12.5)
Monocytes Relative: 7.3 %
Neutro Abs: 4049 cells/uL (ref 1500–7800)
Neutrophils Relative %: 72.3 %
Platelets: 293 10*3/uL (ref 140–400)
RBC: 3.55 10*6/uL — ABNORMAL LOW (ref 3.80–5.10)
RDW: 13.1 % (ref 11.0–15.0)
Total Lymphocyte: 20 %
WBC: 5.6 10*3/uL (ref 3.8–10.8)

## 2020-05-24 LAB — COMPLETE METABOLIC PANEL WITH GFR
AG Ratio: 0.6 (calc) — ABNORMAL LOW (ref 1.0–2.5)
ALT: 7 U/L (ref 6–29)
AST: 12 U/L (ref 10–35)
Albumin: 3.1 g/dL — ABNORMAL LOW (ref 3.6–5.1)
Alkaline phosphatase (APISO): 73 U/L (ref 37–153)
BUN/Creatinine Ratio: 17 (calc) (ref 6–22)
BUN: 26 mg/dL — ABNORMAL HIGH (ref 7–25)
CO2: 27 mmol/L (ref 20–32)
Calcium: 9.2 mg/dL (ref 8.6–10.4)
Chloride: 101 mmol/L (ref 98–110)
Creat: 1.54 mg/dL — ABNORMAL HIGH (ref 0.60–0.88)
GFR, Est African American: 35 mL/min/{1.73_m2} — ABNORMAL LOW (ref >=60)
GFR, Est Non African American: 30 mL/min/{1.73_m2} — ABNORMAL LOW (ref >=60)
Globulin: 4.8 g/dL (calc) — ABNORMAL HIGH (ref 1.9–3.7)
Glucose, Bld: 145 mg/dL — ABNORMAL HIGH (ref 65–139)
Potassium: 3.9 mmol/L (ref 3.5–5.3)
Sodium: 137 mmol/L (ref 135–146)
Total Bilirubin: 0.3 mg/dL (ref 0.2–1.2)
Total Protein: 7.9 g/dL (ref 6.1–8.1)

## 2020-05-24 LAB — LIPASE: Lipase: 33 U/L (ref 7–60)

## 2020-05-24 LAB — VITAMIN D 25 HYDROXY (VIT D DEFICIENCY, FRACTURES): Vit D, 25-Hydroxy: 90 ng/mL (ref 30–100)

## 2020-05-24 LAB — AMYLASE: Amylase: 295 U/L — ABNORMAL HIGH (ref 21–101)

## 2020-05-24 MED ORDER — OMEPRAZOLE 20 MG PO CPDR: 20.0000 mg | DELAYED_RELEASE_CAPSULE | Freq: Every day | ORAL | 3 refills | Status: DC

## 2020-05-24 MED ORDER — DENOSUMAB 60 MG/ML ~~LOC~~ SOSY
60.0000 mg | PREFILLED_SYRINGE | Freq: Once | SUBCUTANEOUS | Status: AC
  Administered 2020-05-24: 60 mg via SUBCUTANEOUS

## 2020-05-24 MED ORDER — FEROSUL 325 (65 FE) MG PO TABS: 325.0000 mg | ORAL_TABLET | Freq: Every day | ORAL | 1 refills | Status: DC

## 2020-05-24 NOTE — Progress Notes (Signed)
Careteam: Patient Care Team: Lauree Chandler, NP as PCP - General (Geriatric Medicine) [name], MD (Pulmonary Disease) Jacolyn Reedy, MD as Consulting Physician (Cardiology)  PLACE OF SERVICE:  [place]  Advanced Directive information    Allergies  Allergen Reactions   Aspirin Other (See Comments)    REACTION: upset stomach   Codeine Other (See Comments)    REACTION: "MAKES MY BODY GO CRAZY; CRAMPS"   Latex Nausea And Vomiting    Chief Complaint  Patient presents with   Medical Management of Chronic Issues    6 month follow-up    Abdominal Pain    Patient c/o stomach pains x 2 weeks    Injections    Prolia injection    Quality Metric Gaps    Discuss need for colonoscopy      HPI: Patient is a 86 y.o. female for routine follow up. Pt was seen 4 days ago for acute visit to evaluate legs. Pt with lymphedema and was referred lymphemia clinic (she had been seen there but in [place] and wanted to go to a local clinic) Legs are doing much better at this time.   Osteoporosis- getting prolia injection today, due for bone density.   Niece living with her at this time. Needing information on in home care. It is hard to have someone there to wrap her legs.   Reports she is always nauseous feeling of acid reflux, used to be on zantac prior to recall but has not been on anything since. Reports symptoms been going on for 3 weeks. No changes in diet. Continues to eat well.  No diarrhea or constipation.  Drinks a lot of coffee No fevers or chills   Review of Systems:  Review of Systems  Constitutional: Negative for chills, fever and weight loss.  HENT: Negative for tinnitus.   Respiratory: Negative for cough, sputum production and shortness of breath.   Cardiovascular: Positive for leg swelling. Negative for chest pain and palpitations.  Gastrointestinal: Positive for abdominal pain, heartburn and nausea. Negative for constipation, diarrhea and  vomiting.  Genitourinary: Negative for dysuria, frequency and urgency.  Musculoskeletal: Negative for back pain, falls, joint pain and myalgias.  Skin: Negative.   Neurological: Negative for dizziness, weakness and headaches.  Psychiatric/Behavioral: Positive for memory loss. Negative for depression. The patient does not have insomnia.     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   Systolic heart failure ([place])    EF 35 to 40% per echo January 2015   Unspecified essential hypertension    Unspecified nonpsychotic mental disorder    Unspecified venous (peripheral) insufficiency    Unspecified venous (peripheral) insufficiency    Unspecified vitamin D deficiency    Past Surgical History:  Procedure Laterality Date   APPENDECTOMY     CERVICAL SPINE SURGERY     x 2   CHOLECYSTECTOMY     ESOPHAGOGASTRODUODENOSCOPY N/A 04/05/2015   Procedure: ESOPHAGOGASTRODUODENOSCOPY (EGD);  Surgeon: [name], MD;  Location: [facility];  Service: Endoscopy;  Laterality: N/A;   FLEXIBLE  SIGMOIDOSCOPY N/A 04/05/2015   Procedure: FLEXIBLE SIGMOIDOSCOPY;  Surgeon: [name], MD;  Location: [facility];  Service: Endoscopy;  Laterality: N/A;   HAND SURGERY     LUMBAR DISC SURGERY     PARTIAL HYSTERECTOMY     TONSILLECTOMY     Social History:   reports that she has never smoked. She has never used smokeless tobacco. She reports that she does not drink alcohol and does not use drugs.  Family History  Problem Relation Age of Onset   Diabetes Mother    Arthritis Father    Diabetes Paternal Grandfather        entire family on both  sides   Heart disease Maternal Aunt        entire family of both sides   Cancer Paternal Aunt        type unknown   Diabetes Sister    Breast cancer Cousin     Medications: Patient's Medications  New Prescriptions   No medications on file  Previous Medications   ACETAMINOPHEN (TYLENOL 8 HOUR) 650 MG CR TABLET    Take 1 tablet (650 mg total) by mouth 3 (three) times daily with meals.   ALBUTEROL (PROAIR HFA) 108 (90 BASE) MCG/ACT INHALER    INHALE 2 PUFFS EVERY 4 HOURS AS NEEDED FOR WHEEZING   ASCORBIC ACID (VITAMIN C) 1000 MG TABLET    2 by mouth twice daily before lunch and supper    CALCIUM-VITAMIN D (OSCAL WITH D) 500-200 MG-UNIT TABLET    Take 1 tablet by mouth daily with breakfast.   CARVEDILOL (COREG) 6.25 MG TABLET    Take 1 tablet twice daily in lunch and supper.   CETIRIZINE (ZYRTEC) 10 MG TABLET    Take 1 tablet (10 mg total) by mouth daily.   DENOSUMAB (PROLIA) 60 MG/ML SOSY INJECTION    Inject 60 mg into the skin every 6 (six) months.   FERROUS SULFATE DRIED 200 (65 FE) MG TABS    Take 1 tablet by mouth daily.   FLUTICASONE-SALMETEROL (ADVAIR HFA) 115-21 MCG/ACT INHALER    INHALE 2 PUFFS BY MOUTH TWICE DAILY   HYDRALAZINE (APRESOLINE) 25 MG TABLET    TAKE 1 TABLET(25 MG TOTAL) BY MOUTH TWICE DAILY AT 8 AM AND 10 PM   OLANZAPINE (ZYPREXA) 5 MG TABLET    Take 1 tablet (5 mg total) by mouth at bedtime.   POTASSIUM CHLORIDE SA (KLOR-CON) 20 MEQ TABLET    Take 2 tablets (40 mEq total) by mouth 2 (two) times daily before lunch and supper.   TORSEMIDE (DEMADEX) 20 MG TABLET    Take 2 tablets (40 mg total) by mouth 2 (two) times daily. Take 2 tabs at 8 am and 10 pm   TRIAMCINOLONE CREAM (KENALOG) 0.1 %    Apply 1 application topically 2 (two) times daily.   VITAMIN D, ERGOCALCIFEROL, (DRISDOL) 1.25 MG (50000 UNIT) CAPS CAPSULE    Take 1 capsule (50,000 Units total) by mouth every Monday.  Modified Medications   No medications on file  Discontinued Medications   FEROSUL 325 (65  FE) MG TABLET    Take 325 mg by mouth daily.    Physical Exam:  Vitals:   05/24/20 1433  BP: (!) 138/90  Pulse: 66  Temp: (!) 96.9 F (36.1 C)  TempSrc: Temporal  SpO2: 97%  Weight: 139 lb (63 kg)  Height: 4\' 10"  (1.473 m)   Body mass index is 29.05 kg/m. Wt Readings from Last 3 Encounters:  05/24/20 139 lb (63 kg)  05/21/20 136 lb (61.7 kg)  12/05/19 136 lb (61.7 kg)    Physical Exam Constitutional:      General: She is not in acute distress.    Appearance: She is well-developed. She is not diaphoretic.  HENT:     Head: Normocephalic and atraumatic.     Mouth/Throat:     Pharynx: No oropharyngeal exudate.  Eyes:     Conjunctiva/sclera: Conjunctivae normal.     Pupils: Pupils are equal, round, and reactive to light.  Cardiovascular:     Rate and Rhythm: Normal rate and regular rhythm.     Heart sounds: Normal heart sounds.  Pulmonary:     Effort: Pulmonary effort is normal.     Breath sounds: Normal breath sounds.  Abdominal:     General: Bowel sounds are normal. There is no distension.     Palpations: Abdomen is soft.     Tenderness: There is generalized abdominal tenderness.  Musculoskeletal:        General: No tenderness.     Cervical back: Normal range of motion and neck supple.  Skin:    General: Skin is warm and dry.  Neurological:     Mental Status: She is alert.     Comments: Poor historian  Psychiatric:        Mood and Affect: Mood normal.   *  Labs reviewed: Basic Metabolic Panel: Recent Labs    06/09/19 1605 10/17/19 1549  NA 137 140  K 3.8 3.8  CL 104 106  CO2 28 26  GLUCOSE 126 95  BUN 20 10  CREATININE 1.34* 1.08*  CALCIUM 9.3 9.5   Liver Function Tests: Recent Labs    06/09/19 1605  AST 16  ALT 10  BILITOT 0.4  PROT 8.8*   No results for input(s): LIPASE, AMYLASE in the last 8760 hours. No results for input(s): AMMONIA in the last 8760 hours. CBC: Recent Labs    06/09/19 1605 07/03/19 1513 10/17/19 1549  WBC 5.1  6.5 5.9  NEUTROABS 3,453 4,745 4,083  HGB 10.2* 10.0* 9.5*  HCT 32.5* 32.2* 31.0*  MCV 85.3 85.9 85.6  PLT 229 322 305   Lipid Panel: No results for input(s): CHOL, HDL, LDLCALC, TRIG, CHOLHDL, LDLDIRECT in the last 8760 hours. TSH: No results for input(s): TSH in the last 8760 hours. A1C: Lab Results  Component Value Date   HGBA1C 5.5 04/14/2014     Assessment/Plan 1. Lymphedema of both lower extremities Ongoing, has follow up with lymphedema clinic, also wrapping legs which has been beneficial.   2. Estrogen deficiency - DG Bone Density; Future  3. Iron deficiency anemia, unspecified iron deficiency anemia type - CBC with Differential/Platelet - FEROSUL 325 (65 Fe) MG tablet; Take 1 tablet (325 mg total) by mouth daily with breakfast.  Dispense: 90 tablet; Refill: 1  4. Essential hypertension Stable at this time, continue current regimen. - CBC with Differential/Platelet - COMPLETE METABOLIC PANEL WITH GFR  5. Paranoid delusion ([place]) Without worsening of symptoms.   6. Hyperkeratosis -niece soaking leg and using triamcinolone cream BID which has been beneficial   7. Vitamin D deficiency Continues on supplement - Vitamin D, 25-hydroxy  8. Obstructive chronic bronchitis ([place]) -stable,without recent flare  9. Chronic systolic heart failure (HCC) euvolemic- continues on coreg with toresemide and potassium supplement  10. Senile osteoporosis - denosumab (PROLIA) injection 60 mg  11. Dyspepsia - omeprazole (PRILOSEC) 20 MG capsule; Take 1 capsule (20 mg total) by  mouth daily.  Dispense: 30 capsule; Refill: 3  12. Generalized abdominal pain -thought to be due to GERD, she has hx of and previously on medication but stopped at this time. Will get lab work at this time. And have family monitor for worsening of symptoms, fever or chills. She continues to have good appetite and drinking well. - Amylase - Lipase  13. Age-related osteoporosis without current  pathological fracture -continues on prolia, encouraged to make appt for follow up bone density, order sent. Also continue cal and vit d  Next appt: 4 months, sooner if needed Damarius Karnes K. [place], [facility] [phone]

## 2020-05-24 NOTE — Patient Instructions (Addendum)
To start omeprazole 20 mg daily for acid reflux   Heartburn Heartburn is a type of pain or discomfort that can happen in the throat or chest. It is often described as a burning pain. It may also cause a bad, acid-like taste in the mouth. Heartburn may feel worse when you lie down or bend over, and it is often worse at night. Heartburn may be caused by stomach contents that move back up into the esophagus (reflux). Follow these instructions at home: Eating and drinking   Avoid certain foods and drinks as told by your health care provider. This may include: ? Coffee and tea (with or without caffeine). ? Drinks that contain alcohol. ? Energy drinks and sports drinks. ? Carbonated drinks or sodas. ? Chocolate and cocoa. ? Peppermint and mint flavorings. ? Garlic and onions. ? Horseradish. ? Spicy and acidic foods, including peppers, chili powder, curry powder, vinegar, hot sauces, and barbecue sauce. ? Citrus fruit juices and citrus fruits, such as oranges, lemons, and limes. ? Tomato-based foods, such as red sauce, chili, salsa, and pizza with red sauce. ? Fried and fatty foods, such as donuts, french fries, potato chips, and high-fat dressings. ? High-fat meats, such as hot dogs and fatty cuts of red and white meats, such as rib eye steak, sausage, ham, and bacon. ? High-fat dairy items, such as whole milk, butter, and cream cheese.  Eat small, frequent meals instead of large meals.  Avoid drinking large amounts of liquid with your meals.  Avoid eating meals during the 2-3 hours before bedtime.  Avoid lying down right after you eat.  Do not exercise right after you eat. Lifestyle      If you are overweight, reduce your weight to an amount that is healthy for you. Ask your health care provider for guidance about a safe weight loss goal.  Do not use any products that contain nicotine or tobacco, such as cigarettes, e-cigarettes, and chewing tobacco. These can make your symptoms  worse. If you need help quitting, ask your health care provider.  Wear loose-fitting clothing. Do not wear anything tight around your waist that causes pressure on your abdomen.  Raise (elevate) the head of your bed about 6 inches (15 cm) when you sleep.  Try to reduce your stress, such as with yoga or meditation. If you need help reducing stress, ask your health care provider. General instructions  Pay attention to any changes in your symptoms.  Take over-the-counter and prescription medicines only as told by your health care provider. ? Do not take aspirin, ibuprofen, or other NSAIDs unless your health care provider told you to do so. ? Stop medicines only as told by your health care provider. If you stop taking some medicines too quickly, your symptoms may get worse.  Keep all follow-up visits as told by your health care provider. This is important. Contact a health care provider if:  You have new symptoms.  You have unexplained weight loss.  You have difficulty swallowing, or it hurts to swallow.  You have wheezing or a persistent cough.  Your symptoms do not improve with treatment.  You have frequent heartburn for more than 2 weeks. Get help right away if:  You have pain in your arms, neck, jaw, teeth, or back.  You feel sweaty, dizzy, or light-headed.  You have chest pain or shortness of breath.  You vomit and your vomit looks like blood or coffee grounds.  Your stool is bloody or black. These symptoms  may represent a serious problem that is an emergency. Do not wait to see if the symptoms will go away. Get medical help right away. Call your local emergency services (911 in the [place] Do not drive yourself to the hospital. Summary  Heartburn is a type of pain or discomfort that can happen in the throat or chest. It is often described as a burning pain. It may also cause a bad, acid-like taste in the mouth.  Avoid certain foods and drinks as told by your health care  provider.  Take over-the-counter and prescription medicines only as told by your health care provider. Do not take aspirin, ibuprofen, or other NSAIDs unless your health care provider told you to do so.  Contact a health care provider if your symptoms do not improve or they get worse. This information is not intended to replace advice given to you by your health care provider. Make sure you discuss any questions you have with your health care provider. Document Revised: 03/18/2018 Document Reviewed: 03/18/2018 Elsevier Patient Education  [place].

## 2020-05-25 ENCOUNTER — Telehealth: Payer: Self-pay

## 2020-05-25 ENCOUNTER — Ambulatory Visit (INDEPENDENT_AMBULATORY_CARE_PROVIDER_SITE_OTHER): Payer: Medicare Other | Admitting: Nurse Practitioner

## 2020-05-25 ENCOUNTER — Encounter: Payer: Self-pay | Admitting: Nurse Practitioner

## 2020-05-25 ENCOUNTER — Other Ambulatory Visit: Payer: Self-pay | Admitting: Nurse Practitioner

## 2020-05-25 DIAGNOSIS — Z0000 Encounter for general adult medical examination without abnormal findings: Secondary | ICD-10-CM | POA: Diagnosis not present

## 2020-05-25 DIAGNOSIS — R1084 Generalized abdominal pain: Secondary | ICD-10-CM

## 2020-05-25 DIAGNOSIS — R748 Abnormal levels of other serum enzymes: Secondary | ICD-10-CM

## 2020-05-25 NOTE — Telephone Encounter (Signed)
[name], pardi are scheduled for a virtual visit with your provider today.    Just as we do with appointments in the office, we must obtain your consent to participate.  Your consent will be active for this visit and any virtual visit you may have with one of our providers in the next 365 days.    If you have a MyChart account, I can also send a copy of this consent to you electronically.  All virtual visits are billed to your insurance company just like a traditional visit in the office.  As this is a virtual visit, video technology does not allow for your provider to perform a traditional examination.  This may limit your provider's ability to fully assess your condition.  If your provider identifies any concerns that need to be evaluated in person or the need to arrange testing such as labs, EKG, etc, we will make arrangements to do so.    Although advances in technology are sophisticated, we cannot ensure that it will always work on either your end or our end.  If the connection with a video visit is poor, we may have to switch to a telephone visit.  With either a video or telephone visit, we are not always able to ensure that we have a secure connection.   I need to obtain your verbal consent now.   Are you willing to proceed with your visit today?   [name] has provided verbal consent on 05/25/2020 for a virtual visit (video or telephone).   Carroll Kinds, [facility] 05/25/2020  9:39 AM

## 2020-05-25 NOTE — Progress Notes (Signed)
This service is provided via telemedicine  No vital signs collected/recorded due to the encounter was a telemedicine visit.   Location of patient (ex: home, work):  Home  Patient consents to a telephone visit:  Yes, see encounter dated 05/25/2020  Location of the provider (ex: office, home):  [place]  Name of any referring provider:  N/A  Names of all persons participating in the telemedicine service and their role in the encounter:  Sherrie Mustache, Nurse Practitioner, Carroll Kinds, CMA, and patient.   Time spent on call:  10 minutes with medical assistant.

## 2020-05-25 NOTE — Patient Instructions (Addendum)
[name] , Thank you for taking time to come for your Medicare Wellness Visit. I appreciate your ongoing commitment to your health goals. Please review the following plan we discussed and let me know if I can assist you in the future.   Screening recommendations/referrals: Colonoscopy aged out Mammogram aged out Bone Density DUE- to schedule through [facility] Recommended yearly ophthalmology/optometry visit for glaucoma screening and checkup Recommended yearly dental visit for hygiene and checkup  Vaccinations: Influenza vaccine up to date Pneumococcal vaccine up to date Tdap vaccine up todate Shingles vaccine RECOMMENDED, to get at local pharmacy    Advanced directives: on file.   Conditions/risks identified: advanced age, memory loss, hypertension.   Next appointment: 1 year   Preventive Care 80 Years and Older, Female Preventive care refers to lifestyle choices and visits with your health care provider that can promote health and wellness. What does preventive care include?  A yearly physical exam. This is also called an annual well check.  Dental exams once or twice a year.  Routine eye exams. Ask your health care provider how often you should have your eyes checked.  Personal lifestyle choices, including:  Daily care of your teeth and gums.  Regular physical activity.  Eating a healthy diet.  Avoiding tobacco and drug use.  Limiting alcohol use.  Practicing safe sex.  Taking low-dose aspirin every day.  Taking vitamin and mineral supplements as recommended by your health care provider. What happens during an annual well check? The services and screenings done by your health care provider during your annual well check will depend on your age, overall health, lifestyle risk factors, and family history of disease. Counseling  Your health care provider may ask you questions about your:  Alcohol use.  Tobacco use.  Drug  use.  Emotional well-being.  Home and relationship well-being.  Sexual activity.  Eating habits.  History of falls.  Memory and ability to understand (cognition).  Work and work Statistician.  Reproductive health. Screening  You may have the following tests or measurements:  Height, weight, and BMI.  Blood pressure.  Lipid and cholesterol levels. These may be checked every 5 years, or more frequently if you are over 28 years old.  Skin check.  Lung cancer screening. You may have this screening every year starting at age 39 if you have a 30-pack-year history of smoking and currently smoke or have quit within the past 15 years.  Fecal occult blood test (FOBT) of the stool. You may have this test every year starting at age 84.  Flexible sigmoidoscopy or colonoscopy. You may have a sigmoidoscopy every 5 years or a colonoscopy every 10 years starting at age 29.  Hepatitis C blood test.  Hepatitis B blood test.  Sexually transmitted disease (STD) testing.  Diabetes screening. This is done by checking your blood sugar (glucose) after you have not eaten for a while (fasting). You may have this done every 1-3 years.  Bone density scan. This is done to screen for osteoporosis. You may have this done starting at age 84.  Mammogram. This may be done every 1-2 years. Talk to your health care provider about how often you should have regular mammograms. Talk with your health care provider about your test results, treatment options, and if necessary, the need for more tests. Vaccines  Your health care provider may recommend certain vaccines, such as:  Influenza vaccine. This is recommended every year.  Tetanus, diphtheria, and acellular pertussis (Tdap, Td)  vaccine. You may need a Td booster every 10 years.  Zoster vaccine. You may need this after age 35.  Pneumococcal 13-valent conjugate (PCV13) vaccine. One dose is recommended after age 32.  Pneumococcal polysaccharide  (PPSV23) vaccine. One dose is recommended after age 60. Talk to your health care provider about which screenings and vaccines you need and how often you need them. This information is not intended to replace advice given to you by your health care provider. Make sure you discuss any questions you have with your health care provider. Document Released: 11/12/2015 Document Revised: 07/05/2016 Document Reviewed: 08/17/2015 Elsevier Interactive Patient Education  2017 [employer] Prevention in the Home Falls can cause injuries. They can happen to people of all ages. There are many things you can do to make your home safe and to help prevent falls. What can I do on the outside of my home?  Regularly fix the edges of walkways and driveways and fix any cracks.  Remove anything that might make you trip as you walk through a door, such as a raised step or threshold.  Trim any bushes or trees on the path to your home.  Use bright outdoor lighting.  Clear any walking paths of anything that might make someone trip, such as rocks or tools.  Regularly check to see if handrails are loose or broken. Make sure that both sides of any steps have handrails.  Any raised decks and porches should have guardrails on the edges.  Have any leaves, snow, or ice cleared regularly.  Use sand or salt on walking paths during winter.  Clean up any spills in your garage right away. This includes oil or grease spills. What can I do in the bathroom?  Use night lights.  Install grab bars by the toilet and in the tub and shower. Do not use towel bars as grab bars.  Use non-skid mats or decals in the tub or shower.  If you need to sit down in the shower, use a plastic, non-slip stool.  Keep the floor dry. Clean up any water that spills on the floor as soon as it happens.  Remove soap buildup in the tub or shower regularly.  Attach bath mats securely with double-sided non-slip rug tape.  Do not have  throw rugs and other things on the floor that can make you trip. What can I do in the bedroom?  Use night lights.  Make sure that you have a light by your bed that is easy to reach.  Do not use any sheets or blankets that are too big for your bed. They should not hang down onto the floor.  Have a firm chair that has side arms. You can use this for support while you get dressed.  Do not have throw rugs and other things on the floor that can make you trip. What can I do in the kitchen?  Clean up any spills right away.  Avoid walking on wet floors.  Keep items that you use a lot in easy-to-reach places.  If you need to reach something above you, use a strong step stool that has a grab bar.  Keep electrical cords out of the way.  Do not use floor polish or wax that makes floors slippery. If you must use wax, use non-skid floor wax.  Do not have throw rugs and other things on the floor that can make you trip. What can I do with my stairs?  Do not leave  any items on the stairs.  Make sure that there are handrails on both sides of the stairs and use them. Fix handrails that are broken or loose. Make sure that handrails are as long as the stairways.  Check any carpeting to make sure that it is firmly attached to the stairs. Fix any carpet that is loose or worn.  Avoid having throw rugs at the top or bottom of the stairs. If you do have throw rugs, attach them to the floor with carpet tape.  Make sure that you have a light switch at the top of the stairs and the bottom of the stairs. If you do not have them, ask someone to add them for you. What else can I do to help prevent falls?  Wear shoes that:  Do not have high heels.  Have rubber bottoms.  Are comfortable and fit you well.  Are closed at the toe. Do not wear sandals.  If you use a stepladder:  Make sure that it is fully opened. Do not climb a closed stepladder.  Make sure that both sides of the stepladder are  locked into place.  Ask someone to hold it for you, if possible.  Clearly mark and make sure that you can see:  Any grab bars or handrails.  First and last steps.  Where the edge of each step is.  Use tools that help you move around (mobility aids) if they are needed. These include:  Canes.  Walkers.  Scooters.  Crutches.  Turn on the lights when you go into a dark area. Replace any light bulbs as soon as they burn out.  Set up your furniture so you have a clear path. Avoid moving your furniture around.  If any of your floors are uneven, fix them.  If there are any pets around you, be aware of where they are.  Review your medicines with your doctor. Some medicines can make you feel dizzy. This can increase your chance of falling. Ask your doctor what other things that you can do to help prevent falls. This information is not intended to replace advice given to you by your health care provider. Make sure you discuss any questions you have with your health care provider. Document Released: 08/12/2009 Document Revised: 03/23/2016 Document Reviewed: 11/20/2014 Elsevier Interactive Patient Education  2017 [employer].

## 2020-05-25 NOTE — Progress Notes (Signed)
Subjective:   [name] is a 86 y.o. female who presents for Medicare Annual (Subsequent) preventive examination.  Review of Systems     Cardiac Risk Factors include: advanced age (>75men, >38 women);hypertension     Objective:    There were no vitals filed for this visit. There is no height or weight on file to calculate BMI.  Advanced Directives 05/25/2020 05/25/2020 05/21/2020 11/26/2019 06/09/2019 05/05/2019 03/06/2019  Does Patient Have a Medical Advance Directive? Yes Yes Yes Yes No No No  Type of Programmer, systems - Healthcare Power of [place];Living will - - -  Does patient want to make changes to medical advance directive? No - Patient declined No - Patient declined No - Patient declined - - - -  Copy of [place] in Chart? Yes - validated most recent copy scanned in chart (See row information) - - No - copy requested - - -  Would patient like information on creating a medical advance directive? - - - - No - Patient declined - No - Patient declined    Current Medications (verified) Outpatient Encounter Medications as of 05/25/2020  Medication Sig  . acetaminophen (TYLENOL 8 HOUR) 650 MG CR tablet Take 1 tablet (650 mg total) by mouth 3 (three) times daily with meals.  [name] albuterol (PROAIR HFA) 108 (90 Base) MCG/ACT inhaler INHALE 2 PUFFS EVERY 4 HOURS AS NEEDED FOR WHEEZING  . Ascorbic Acid (VITAMIN C) 1000 MG tablet 2 by mouth twice daily before lunch and supper   . calcium-vitamin D (OSCAL WITH D) 500-200 MG-UNIT tablet Take 1 tablet by mouth daily with breakfast.  . carvedilol (COREG) 6.25 MG tablet Take 1 tablet twice daily in lunch and supper.  . cetirizine (ZYRTEC) 10 MG tablet Take 1 tablet (10 mg total) by mouth daily.  [name] denosumab (PROLIA) 60 MG/ML SOSY injection Inject 60 mg into the skin every 6 (six) months.  . FEROSUL 325 (65 Fe) MG tablet Take 1 tablet (325 mg total) by mouth daily with  breakfast.  . Ferrous Sulfate Dried 200 (65 Fe) MG TABS Take 1 tablet by mouth daily.  . fluticasone-salmeterol (ADVAIR HFA) 115-21 MCG/ACT inhaler INHALE 2 PUFFS BY MOUTH TWICE DAILY  . hydrALAZINE (APRESOLINE) 25 MG tablet TAKE 1 TABLET(25 MG TOTAL) BY MOUTH TWICE DAILY AT 8 AM AND 10 PM  . OLANZapine (ZYPREXA) 5 MG tablet Take 1 tablet (5 mg total) by mouth at bedtime.  [name] omeprazole (PRILOSEC) 20 MG capsule Take 1 capsule (20 mg total) by mouth daily.  . potassium chloride SA (KLOR-CON) 20 MEQ tablet Take 2 tablets (40 mEq total) by mouth 2 (two) times daily before lunch and supper.  . torsemide (DEMADEX) 20 MG tablet Take 2 tablets (40 mg total) by mouth 2 (two) times daily. Take 2 tabs at 8 am and 10 pm  . triamcinolone cream (KENALOG) 0.1 % Apply 1 application topically 2 (two) times daily.  . Vitamin D, Ergocalciferol, (DRISDOL) 1.25 MG (50000 UNIT) CAPS capsule Take 1 capsule (50,000 Units total) by mouth every Monday.   No facility-administered encounter medications on file as of 05/25/2020.    Allergies (verified) Aspirin, Codeine, and Latex   History: Past Medical History:  Diagnosis Date  . Anemia, unspecified   . Anxiety   . Arthritis   . Asthma   . Delusions ([place])   . Depression   . Edema 2005   "since put the glass in  them"   . GERD (gastroesophageal reflux disease)   . Lumbago   . Lymphedema   . Obstructive chronic bronchitis without exacerbation ([place])   . Other abnormal glucose   . 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 Systolic heart failure ([facility])    EF 35 to 40% per echo January 2015  . Unspecified essential hypertension   . Unspecified nonpsychotic mental disorder   . Unspecified venous (peripheral) insufficiency   . Unspecified venous (peripheral) insufficiency   . Unspecified vitamin D deficiency    Past Surgical History:  Procedure Laterality Date  . APPENDECTOMY    . CERVICAL SPINE SURGERY     x 2  . CHOLECYSTECTOMY    . ESOPHAGOGASTRODUODENOSCOPY  N/A 04/05/2015   Procedure: ESOPHAGOGASTRODUODENOSCOPY (EGD);  Surgeon: [name], MD;  Location: [facility];  Service: Endoscopy;  Laterality: N/A;  . FLEXIBLE SIGMOIDOSCOPY N/A 04/05/2015   Procedure: FLEXIBLE SIGMOIDOSCOPY;  Surgeon: [name], MD;  Location: [facility];  Service: Endoscopy;  Laterality: N/A;  . HAND SURGERY    . LUMBAR DISC SURGERY    . PARTIAL HYSTERECTOMY    . TONSILLECTOMY     Family History  Problem Relation Age of Onset  . Diabetes Mother   . Arthritis Father   . Diabetes Paternal Grandfather        entire family on both sides  . Heart disease Maternal Aunt        entire family of both sides  . Cancer Paternal Aunt        type unknown  . Diabetes Sister   . Breast cancer Cousin    Social History   Socioeconomic History  . Marital status: Widowed    Spouse name: Not on file  . Number of children: 0  . Years of education: Not on file  . Highest education level: Not on file  Occupational History  . Occupation: retired  Tobacco Use  . Smoking status: Never Smoker  . Smokeless tobacco: Never Used  Vaping Use  . Vaping Use: Never used  Substance and Sexual Activity  . Alcohol use: No  . Drug use: No  . Sexual activity: Not Currently  Other Topics Concern  . Not on file  Social History Narrative   Diet? Good, low appetite      Do you drink/eat things with caffeine? no      Marital status?                  widowed                  What year were you married?      Do you live in a house, apartment, assisted living, condo, trailer, etc.? house      Is it one or more stories? 1 story      How many persons live in your home? Only me      Do you have any pets in your home? (please list) no      Current or past profession: lab tech      Do you exercise?        some                              Type & how often? Foot rolls, some walking      Do you have a living will? yes      Do you have a DNR form?  no                               If not, do you want to discuss one?      Do you have signed POA/HPOA for forms? yes   Social Determinants of Health   Financial Resource Strain:   . Difficulty of Paying Living Expenses:   Food Insecurity:   . Worried About Charity fundraiser in the Last Year:   . Arboriculturist in the Last Year:   Transportation Needs:   . Film/video editor (Medical):   [name] Lack of Transportation (Non-Medical):   Physical Activity:   . Days of Exercise per Week:   . Minutes of Exercise per Session:   Stress:   . Feeling of Stress :   Social Connections:   . Frequency of Communication with Friends and Family:   . Frequency of Social Gatherings with Friends and Family:   . Attends Religious Services:   . Active Member of Clubs or Organizations:   . Attends Archivist Meetings:   [name] Marital Status:     Tobacco Counseling Counseling given: Not Answered   Clinical Intake:  Pre-visit preparation completed: Yes  Pain : No/denies pain     BMI - recorded: 29 Nutritional Status: BMI 25 -29 Overweight Nutritional Risks: Nausea/ vomitting/ diarrhea Diabetes: No  How often do you need to have someone help you when you read instructions, pamphlets, or other written materials from your doctor or pharmacy?: 5 - Always  Diabetic?no         Activities of Daily Living In your present state of health, do you have any difficulty performing the following activities: 05/25/2020  Hearing? N  Vision? N  Difficulty concentrating or making decisions? N  Walking or climbing stairs? Y  Comment uses cane  Dressing or bathing? Y  Doing errands, shopping? Y  Preparing Food and eating ? Y  Using the Toilet? Y  Comment getting elevated toilet seat  In the past six months, have you accidently leaked urine? Y  Do you have problems with loss of bowel control? Y  Comment does not get to the bathroom in time  Managing your Medications? Y  Managing your Finances? Y  Comment nephew in  charge of finances  Housekeeping or managing your Housekeeping? Y  Comment neice does housekeeping  Some recent data might be hidden    Patient Care Team: Lauree Chandler, NP as PCP - General (Geriatric Medicine) [name], MD (Pulmonary Disease) Jacolyn Reedy, MD as Consulting Physician (Cardiology)  Indicate any recent Medical Services you may have received from other than [facility] providers in the past year (date may be approximate).     Assessment:   This is a routine wellness examination for [place].  Hearing/Vision screen  Hearing Screening   125Hz  250Hz  500Hz  1000Hz  2000Hz  3000Hz  4000Hz  6000Hz  8000Hz   Right ear:           Left ear:           Comments: Patient has no hearing issues.  Vision Screening Comments: Patient doesn't have any vision problems. She has not had a recent eye exam  Dietary issues and exercise activities discussed: Current Exercise Habits: The patient does not participate in regular exercise at present  Goals    . <enter goal here>     Starting 11/24/16, I will maintain my current lifestyle.  Depression Screen PHQ 2/9 Scores 05/25/2020 05/24/2020 05/05/2019 03/29/2018 12/18/2016 11/24/2016 10/18/2016  PHQ - 2 Score 0 0 0 0 0 0 0    Fall Risk Fall Risk  05/25/2020 05/24/2020 05/21/2020 10/17/2019 07/24/2019  Falls in the past year? 0 0 0 0 0  Number falls in past yr: 0 0 0 0 0  Injury with Fall? 0 0 0 0 0  Comment - - - - -  Risk Factor Category  - - - - -  Risk for fall due to : - - - - -  Risk for fall due to: Comment - - - - -  Follow up - - - - -    Any stairs in or around the home? Yes  If so, are there any without handrails? Yes  Home free of loose throw rugs in walkways, pet beds, electrical cords, etc? Yes  Adequate lighting in your home to reduce risk of falls? Yes   ASSISTIVE DEVICES UTILIZED TO PREVENT FALLS:  Life alert? Yes  Use of a cane, walker or w/c? Yes  Grab bars in the bathroom? No  Shower chair or bench in  shower? Yes  Elevated toilet seat or a handicapped toilet? No   TIMED UP AND GO: na  Cognitive Function: MMSE - Mini Mental State Exam 05/05/2019 03/29/2018 11/24/2016  Orientation to time 3 5 5   Orientation to Place 3 5 4   Registration 3 3 3   Attention/ Calculation 0 2 0  Recall 2 1 3   Language- name 2 objects 2 2 2   Language- repeat 1 1 1   Language- follow 3 step command 3 3 1   Language- read & follow direction 0 1 1  Write a sentence 0 0 0  Copy design 0 1 0  Total score 17 24 20      6CIT Screen 05/25/2020  What Year? 4 points  What month? 3 points  What time? 0 points  Count back from 20 4 points  Months in reverse 4 points  Repeat phrase 10 points  Total Score 25    Immunizations Immunization History  Administered Date(s) Administered  . Fluad Quad(high Dose 65+) 06/24/2019  . Influenza Split 07/31/2011  . Influenza Whole 09/30/2009, 07/30/2010  . Influenza,inj,Quad PF,6+ Mos 08/13/2015, 07/03/2017, 08/16/2018  . Influenza-Unspecified 07/30/2016  . PFIZER SARS-COV-2 Vaccination 12/10/2019, 12/31/2019  . Pneumococcal Conjugate-13 11/24/2016  . Pneumococcal Polysaccharide-23 08/16/2018  . Tdap 04/07/2015  . Varicella 08/25/2011    TDAP status: Up to date Flu Vaccine status: Up to date Pneumococcal vaccine status: Up to date Covid-19 vaccine status: Completed vaccines  Qualifies for Shingles Vaccine? Yes   Zostavax completed Yes   Shingrix Completed?: No.    Education has been provided regarding the importance of this vaccine. Patient has been advised to call insurance company to determine out of pocket expense if they have not yet received this vaccine. Advised may also receive vaccine at local pharmacy or Health Dept. Verbalized acceptance and understanding.  Screening Tests Health Maintenance  Topic Date Due  . INFLUENZA VACCINE  05/30/2020  . TETANUS/TDAP  04/06/2025  . DEXA SCAN  Completed  . COVID-19 Vaccine  Completed  . PNA vac Low Risk Adult   Completed    Health Maintenance  There are no preventive care reminders to display for this patient.  Colorectal cancer screening: No longer required.  Mammogram status: No longer required.  Bone Density status: Ordered 05/24/2020. Pt provided with contact info and advised to call to schedule appt.  Lung Cancer Screening: na  Additional Screening:  Hepatitis C Screening: does not qualify  Vision Screening: Recommended annual ophthalmology exa`ms for early detection of glaucoma and other disorders of the eye. Is the patient up to date with their annual eye exam?  No  Who is the provider or what is the name of the office in which the patient attends annual eye exams? unknown If pt is not established with a provider, would they like to be referred to a provider to establish care? Yes .   Dental Screening: Recommended annual dental exams for proper oral hygiene  Community Resource Referral / Chronic Care Management: CRR required this visit?  No   CCM required this visit?  No      Plan:     I have personally reviewed and noted the following in the patient's chart:   . Medical and social history . Use of alcohol, tobacco or illicit drugs  . Current medications and supplements . Functional ability and status . Nutritional status . Physical activity . Advanced directives . List of other physicians . Hospitalizations, surgeries, and ER visits in previous 12 months . Vitals . Screenings to include cognitive, depression, and falls . Referrals and appointments  In addition, I have reviewed and discussed with patient certain preventive protocols, quality metrics, and best practice recommendations. A written personalized care plan for preventive services as well as general preventive health recommendations were provided to patient.     Lauree Chandler, NP   05/25/2020

## 2020-05-26 ENCOUNTER — Ambulatory Visit
Admission: RE | Admit: 2020-05-26 | Discharge: 2020-05-26 | Disposition: A | Discharge status: Home / Self Care | Payer: Medicare Other | Source: Ambulatory Visit | Attending: Nurse Practitioner | Admitting: Nurse Practitioner

## 2020-05-26 ENCOUNTER — Other Ambulatory Visit: Payer: Self-pay | Admitting: Nurse Practitioner

## 2020-05-26 ENCOUNTER — Other Ambulatory Visit: Payer: Self-pay

## 2020-05-26 DIAGNOSIS — R748 Abnormal levels of other serum enzymes: Secondary | ICD-10-CM | POA: Diagnosis not present

## 2020-05-26 DIAGNOSIS — E2839 Other primary ovarian failure: Secondary | ICD-10-CM

## 2020-05-26 DIAGNOSIS — N281 Cyst of kidney, acquired: Secondary | ICD-10-CM | POA: Diagnosis not present

## 2020-05-26 DIAGNOSIS — R1084 Generalized abdominal pain: Secondary | ICD-10-CM | POA: Diagnosis not present

## 2020-05-26 DIAGNOSIS — Q600 Renal agenesis, unilateral: Secondary | ICD-10-CM | POA: Diagnosis not present

## 2020-05-26 DIAGNOSIS — Z9049 Acquired absence of other specified parts of digestive tract: Secondary | ICD-10-CM | POA: Diagnosis not present

## 2020-05-26 DIAGNOSIS — K7689 Other specified diseases of liver: Secondary | ICD-10-CM | POA: Diagnosis not present

## 2020-05-27 ENCOUNTER — Other Ambulatory Visit: Payer: Self-pay

## 2020-05-27 LAB — CBC WITH DIFFERENTIAL/PLATELET
Absolute Monocytes: 401 cells/uL (ref 200–950)
Basophils Absolute: 9 cells/uL (ref 0–200)
Basophils Relative: 0.2 %
Eosinophils Absolute: 0 cells/uL — ABNORMAL LOW (ref 15–500)
Eosinophils Relative: 0 %
HCT: 32.5 % — ABNORMAL LOW (ref 35.0–45.0)
Hemoglobin: 10 g/dL — ABNORMAL LOW (ref 11.7–15.5)
Lymphs Abs: 1166 cells/uL (ref 850–3900)
MCH: 26.5 pg — ABNORMAL LOW (ref 27.0–33.0)
MCHC: 30.8 g/dL — ABNORMAL LOW (ref 32.0–36.0)
MCV: 86.2 fL (ref 80.0–100.0)
MPV: 9.5 fL (ref 7.5–12.5)
Monocytes Relative: 8.9 %
Neutro Abs: 2925 cells/uL (ref 1500–7800)
Neutrophils Relative %: 65 %
Platelets: 290 10*3/uL (ref 140–400)
RBC: 3.77 10*6/uL — ABNORMAL LOW (ref 3.80–5.10)
RDW: 13.4 % (ref 11.0–15.0)
Total Lymphocyte: 25.9 %
WBC: 4.5 10*3/uL (ref 3.8–10.8)

## 2020-05-27 LAB — COMPLETE METABOLIC PANEL WITH GFR
AG Ratio: 0.6 (calc) — ABNORMAL LOW (ref 1.0–2.5)
ALT: 6 U/L (ref 6–29)
AST: 12 U/L (ref 10–35)
Albumin: 3.1 g/dL — ABNORMAL LOW (ref 3.6–5.1)
Alkaline phosphatase (APISO): 65 U/L (ref 37–153)
BUN/Creatinine Ratio: 20 (calc) (ref 6–22)
BUN: 26 mg/dL — ABNORMAL HIGH (ref 7–25)
CO2: 27 mmol/L (ref 20–32)
Calcium: 8.7 mg/dL (ref 8.6–10.4)
Chloride: 105 mmol/L (ref 98–110)
Creat: 1.3 mg/dL — ABNORMAL HIGH (ref 0.60–0.88)
GFR, Est African American: 43 mL/min/{1.73_m2} — ABNORMAL LOW (ref >=60)
GFR, Est Non African American: 37 mL/min/{1.73_m2} — ABNORMAL LOW (ref >=60)
Globulin: 5 g/dL (calc) — ABNORMAL HIGH (ref 1.9–3.7)
Glucose, Bld: 85 mg/dL (ref 65–99)
Potassium: 4.1 mmol/L (ref 3.5–5.3)
Sodium: 140 mmol/L (ref 135–146)
Total Bilirubin: 0.3 mg/dL (ref 0.2–1.2)
Total Protein: 8.1 g/dL (ref 6.1–8.1)

## 2020-05-27 LAB — LIPASE: Lipase: 24 U/L (ref 7–60)

## 2020-05-27 LAB — AMYLASE: Amylase: 147 U/L — ABNORMAL HIGH (ref 21–101)

## 2020-05-28 ENCOUNTER — Other Ambulatory Visit: Payer: Self-pay | Admitting: *Deleted

## 2020-05-28 ENCOUNTER — Encounter: Payer: Medicare Other | Admitting: Nurse Practitioner

## 2020-05-28 DIAGNOSIS — L859 Epidermal thickening, unspecified: Secondary | ICD-10-CM

## 2020-05-28 MED ORDER — TRIAMCINOLONE ACETONIDE 0.1 % EX CREA: 1.0000 "application " | TOPICAL_CREAM | Freq: Two times a day (BID) | CUTANEOUS | 3 refills | Status: DC

## 2020-05-28 NOTE — Telephone Encounter (Signed)
Patient caregiver requested refill.

## 2020-06-14 ENCOUNTER — Other Ambulatory Visit: Payer: Self-pay

## 2020-06-14 ENCOUNTER — Encounter (HOSPITAL_BASED_OUTPATIENT_CLINIC_OR_DEPARTMENT_OTHER): Payer: Medicare Other | Attending: Internal Medicine | Admitting: Internal Medicine

## 2020-06-14 DIAGNOSIS — I872 Venous insufficiency (chronic) (peripheral): Secondary | ICD-10-CM | POA: Diagnosis not present

## 2020-06-14 DIAGNOSIS — I890 Lymphedema, not elsewhere classified: Secondary | ICD-10-CM | POA: Diagnosis not present

## 2020-06-14 DIAGNOSIS — J449 Chronic obstructive pulmonary disease, unspecified: Secondary | ICD-10-CM | POA: Insufficient documentation

## 2020-06-14 DIAGNOSIS — F419 Anxiety disorder, unspecified: Secondary | ICD-10-CM | POA: Insufficient documentation

## 2020-06-14 DIAGNOSIS — Z833 Family history of diabetes mellitus: Secondary | ICD-10-CM | POA: Insufficient documentation

## 2020-06-14 DIAGNOSIS — Z885 Allergy status to narcotic agent status: Secondary | ICD-10-CM | POA: Insufficient documentation

## 2020-06-14 DIAGNOSIS — L97919 Non-pressure chronic ulcer of unspecified part of right lower leg with unspecified severity: Secondary | ICD-10-CM | POA: Insufficient documentation

## 2020-06-14 DIAGNOSIS — Z91040 Latex allergy status: Secondary | ICD-10-CM | POA: Insufficient documentation

## 2020-06-14 DIAGNOSIS — I5022 Chronic systolic (congestive) heart failure: Secondary | ICD-10-CM | POA: Insufficient documentation

## 2020-06-14 DIAGNOSIS — F29 Unspecified psychosis not due to a substance or known physiological condition: Secondary | ICD-10-CM | POA: Insufficient documentation

## 2020-06-14 DIAGNOSIS — I110 Hypertensive heart disease with heart failure: Secondary | ICD-10-CM | POA: Diagnosis not present

## 2020-06-14 DIAGNOSIS — E11622 Type 2 diabetes mellitus with other skin ulcer: Secondary | ICD-10-CM | POA: Diagnosis not present

## 2020-06-14 DIAGNOSIS — Z886 Allergy status to analgesic agent status: Secondary | ICD-10-CM | POA: Insufficient documentation

## 2020-06-14 DIAGNOSIS — I87399 Chronic venous hypertension (idiopathic) with other complications of unspecified lower extremity: Secondary | ICD-10-CM | POA: Diagnosis not present

## 2020-06-15 ENCOUNTER — Ambulatory Visit: Payer: Medicare Other | Attending: Family | Admitting: Occupational Therapy

## 2020-06-16 ENCOUNTER — Other Ambulatory Visit: Payer: Self-pay

## 2020-06-16 ENCOUNTER — Emergency Department (HOSPITAL_COMMUNITY)
Admission: EM | Admit: 2020-06-16 | Discharge: 2020-06-17 | Disposition: A | Discharge status: Home / Self Care | Payer: Medicare Other | Attending: Emergency Medicine | Admitting: Emergency Medicine

## 2020-06-16 ENCOUNTER — Emergency Department (HOSPITAL_COMMUNITY): Payer: Medicare Other

## 2020-06-16 ENCOUNTER — Telehealth: Payer: Self-pay

## 2020-06-16 ENCOUNTER — Ambulatory Visit (INDEPENDENT_AMBULATORY_CARE_PROVIDER_SITE_OTHER): Payer: Medicare Other | Admitting: Podiatry

## 2020-06-16 DIAGNOSIS — Z85048 Personal history of other malignant neoplasm of rectum, rectosigmoid junction, and anus: Secondary | ICD-10-CM | POA: Diagnosis not present

## 2020-06-16 DIAGNOSIS — R7309 Other abnormal glucose: Secondary | ICD-10-CM

## 2020-06-16 DIAGNOSIS — N183 Chronic kidney disease, stage 3 unspecified: Secondary | ICD-10-CM | POA: Diagnosis not present

## 2020-06-16 DIAGNOSIS — I160 Hypertensive urgency: Secondary | ICD-10-CM | POA: Insufficient documentation

## 2020-06-16 DIAGNOSIS — R42 Dizziness and giddiness: Secondary | ICD-10-CM | POA: Diagnosis not present

## 2020-06-16 DIAGNOSIS — J45909 Unspecified asthma, uncomplicated: Secondary | ICD-10-CM | POA: Diagnosis not present

## 2020-06-16 DIAGNOSIS — Z91040 Latex allergy status: Secondary | ICD-10-CM | POA: Diagnosis not present

## 2020-06-16 DIAGNOSIS — R519 Headache, unspecified: Secondary | ICD-10-CM | POA: Diagnosis not present

## 2020-06-16 DIAGNOSIS — I7090 Unspecified atherosclerosis: Secondary | ICD-10-CM | POA: Diagnosis not present

## 2020-06-16 DIAGNOSIS — I6782 Cerebral ischemia: Secondary | ICD-10-CM | POA: Diagnosis not present

## 2020-06-16 DIAGNOSIS — B351 Tinea unguium: Secondary | ICD-10-CM | POA: Diagnosis not present

## 2020-06-16 DIAGNOSIS — I5022 Chronic systolic (congestive) heart failure: Secondary | ICD-10-CM | POA: Diagnosis not present

## 2020-06-16 DIAGNOSIS — I130 Hypertensive heart and chronic kidney disease with heart failure and stage 1 through stage 4 chronic kidney disease, or unspecified chronic kidney disease: Secondary | ICD-10-CM | POA: Insufficient documentation

## 2020-06-16 DIAGNOSIS — I10 Essential (primary) hypertension: Secondary | ICD-10-CM | POA: Diagnosis not present

## 2020-06-16 DIAGNOSIS — F0390 Unspecified dementia without behavioral disturbance: Secondary | ICD-10-CM | POA: Diagnosis not present

## 2020-06-16 DIAGNOSIS — M79676 Pain in unspecified toe(s): Secondary | ICD-10-CM | POA: Diagnosis not present

## 2020-06-16 DIAGNOSIS — Z7951 Long term (current) use of inhaled steroids: Secondary | ICD-10-CM | POA: Diagnosis not present

## 2020-06-16 DIAGNOSIS — Z79899 Other long term (current) drug therapy: Secondary | ICD-10-CM | POA: Diagnosis not present

## 2020-06-16 DIAGNOSIS — H547 Unspecified visual loss: Secondary | ICD-10-CM | POA: Diagnosis not present

## 2020-06-16 DIAGNOSIS — G319 Degenerative disease of nervous system, unspecified: Secondary | ICD-10-CM | POA: Diagnosis not present

## 2020-06-16 DIAGNOSIS — G4489 Other headache syndrome: Secondary | ICD-10-CM | POA: Diagnosis not present

## 2020-06-16 LAB — BASIC METABOLIC PANEL
Anion gap: 9 (ref 5–15)
BUN: 16 mg/dL (ref 8–23)
CO2: 22 mmol/L (ref 22–32)
Calcium: 8.9 mg/dL (ref 8.9–10.3)
Chloride: 108 mmol/L (ref 98–111)
Creatinine, Ser: 1.08 mg/dL — ABNORMAL HIGH (ref 0.44–1.00)
GFR calc Af Amer: 54 mL/min — ABNORMAL LOW (ref >60)
GFR calc non Af Amer: 46 mL/min — ABNORMAL LOW (ref >60)
Glucose, Bld: 88 mg/dL (ref 70–99)
Potassium: 4.2 mmol/L (ref 3.5–5.1)
Sodium: 139 mmol/L (ref 135–145)

## 2020-06-16 LAB — CBC
HCT: 35.4 % — ABNORMAL LOW (ref 36.0–46.0)
Hemoglobin: 10.3 g/dL — ABNORMAL LOW (ref 12.0–15.0)
MCH: 26.2 pg (ref 26.0–34.0)
MCHC: 29.1 g/dL — ABNORMAL LOW (ref 30.0–36.0)
MCV: 90.1 fL (ref 80.0–100.0)
Platelets: 201 10*3/uL (ref 150–400)
RBC: 3.93 MIL/uL (ref 3.87–5.11)
RDW: 16.1 % — ABNORMAL HIGH (ref 11.5–15.5)
WBC: 4.3 10*3/uL (ref 4.0–10.5)
nRBC: 0 % (ref 0.0–0.2)

## 2020-06-16 NOTE — Telephone Encounter (Signed)
To confirm she is taking the medication on her medication list.  Is she having any symptoms? If so needs to go to the ER, we can increase hydralazine to 50 mg three times daily Low sodium diet.

## 2020-06-16 NOTE — ED Triage Notes (Signed)
Pt here via EMS from [facility] for eval of hypertension and headache x 2 weeks. Dizziness this morning. Hx of dementia, but is at her baseline.

## 2020-06-16 NOTE — Telephone Encounter (Signed)
Patient's niece, Lanora Manis, called and stated patient's BP is high.  Today 191/105  Yesterday 190/101 196/102 197/99 - different arm   There is no availability for an appointment tomorrow and niece doesn't want to wait until Friday to be seen and states she will take patient to UC.

## 2020-06-17 ENCOUNTER — Telehealth: Payer: Self-pay | Admitting: *Deleted

## 2020-06-17 ENCOUNTER — Ambulatory Visit: Payer: Medicare Other | Admitting: Occupational Therapy

## 2020-06-17 ENCOUNTER — Encounter: Payer: Self-pay | Admitting: Podiatry

## 2020-06-17 DIAGNOSIS — I517 Cardiomegaly: Secondary | ICD-10-CM | POA: Diagnosis not present

## 2020-06-17 DIAGNOSIS — R519 Headache, unspecified: Secondary | ICD-10-CM | POA: Diagnosis not present

## 2020-06-17 MED ORDER — LABETALOL HCL 5 MG/ML IV SOLN
20.0000 mg | Freq: Once | INTRAVENOUS | Status: AC
  Administered 2020-06-17: 20 mg via INTRAVENOUS
  Filled 2020-06-17: qty 4

## 2020-06-17 MED ORDER — HYDRALAZINE HCL 25 MG PO TABS
50.0000 mg | ORAL_TABLET | Freq: Once | ORAL | Status: AC
  Administered 2020-06-17: 50 mg via ORAL
  Filled 2020-06-17: qty 2

## 2020-06-17 NOTE — Telephone Encounter (Signed)
Patient niece, Apolonio Schneiders, is in the ER with patient waiting for her to be seen since 6:30 yesterday. Stated that the Urgent Care sent her there due to elevated blood pressure.   Niece stated that her blood pressures are running 197/99, 189/91, 197/99, 206/87, 208/92 and this morning 211/99.  They are getting frustrated sitting there and no one can tell them when she is going to be see. Stated it is difficult with her having dementia and neither one has eaten because they are scared to leave afraid they are going to call her.   They are wanting to know if you can look at the tests they ran at the hospital so far and change her medications yourself without them having to wait any longer. She stated that she will not leave the hospital until she hears back from you.   Please Advise.

## 2020-06-17 NOTE — Telephone Encounter (Signed)
Spoke with [name], Niece and they are still waiting in ER.  I asked her if patient is having Chest Pains. And Niece asked patient and patient stated that Yes she was alittle bit.  Niece is going to stay in the ER for now. Stated that she was going to be relieved by her husband and going to go home and shower and eat and rest a bit.  Husband is bringing patient some food and her medications to take.   Niece stated that she will give it a little more time but [name] end up taking patient home and coming in here in the morning. Stated that if she takes patient home she will increase her Hydralazine to 50mg  three times daily and be at the appointment in the morning at 11. (will hold appt).

## 2020-06-17 NOTE — Discharge Instructions (Addendum)
Please make sure to take the appropriate dosage of your blood pressure medications.  Call and follow-up with your primary care provider in a week for recheck.  Return if you have any concern.

## 2020-06-17 NOTE — Telephone Encounter (Signed)
She needs to get her medication in this is why the blood pressure is going up.  Blood work without abnormal findings. Not sure what other test they did.  They could go home and try to increase hydralazine to 50 mg TID and take other medication to see if this helps. Lets see if we can get her in office tomorrow to follow up.

## 2020-06-17 NOTE — Telephone Encounter (Signed)
If she is adamant about leaving and not having any acute symptoms please direct her as below and work her in as directed. Thank you

## 2020-06-17 NOTE — Progress Notes (Signed)
  Subjective:  Patient ID: [name], female    DOB: Jun 22, 1932,  MRN: [id number]  Chief Complaint  Patient presents with  . debride    RFC -nails thick and long    86 y.o. female returns for the above complaint.  Patient states that she has bilateral toenail fungus complaints with painful nature. She denies any other acute complaints. She states that they are painful to touch. She has not been able to go down to self. She would like for me to debride them down.  Objective:  There were no vitals filed for this visit. Podiatric Exam: Vascular: Diminished pedal pulses to the bilateral lower extremity.  There is a dark discoloration noted to the bilateral lower extremity consistent with pigmentary changes. Sensorium: Normal Semmes Weinstein monofilament test. Normal tactile sensation bilaterally. Nail Exam: Pt has thick disfigured discolored nails with subungual debris noted bilateral entire nail hallux through fifth toenails Ulcer Exam: There is no evidence of ulcer or pre-ulcerative changes or infection. Orthopedic Exam: Muscle tone and strength are WNL. No limitations in general ROM. No crepitus or effusions noted. HAV  B/L.  Hammer toes 2-5  B/L. Skin: No Porokeratosis. No infection or ulcers  Assessment & Plan:  Patient was evaluated and treated and all questions answered.  Onychomycosis with pain  -Nails palliatively debrided as below. -Educated on self-care  Xerosis noted bilaterally plantar foot --Educated the patient of the etiology of xerosis and told her to continue applying over-the-counter lotion.  Procedure: Nail Debridement Rationale: pain  Type of Debridement: manual, sharp debridement. Instrumentation: Nail nipper, rotary burr. Number of Nails: 10  Procedures and Treatment: Consent by patient was obtained for treatment procedures. The patient understood the discussion of treatment and procedures well. All questions were answered thoroughly reviewed. Debridement  of mycotic and hypertrophic toenails, 1 through 5 bilateral and clearing of subungual debris. No ulceration, no infection noted.  Return Visit-Office Procedure: Patient instructed to return to the office for a follow up visit 3 months for continued evaluation and treatment.  Boneta Lucks, DPM    No follow-ups on file.

## 2020-06-17 NOTE — Telephone Encounter (Signed)
Noted thank you

## 2020-06-17 NOTE — ED Provider Notes (Signed)
[facility] EMERGENCY DEPARTMENT Provider Note   CSN: [id number] Arrival date & time: 06/16/20  1832     History Chief Complaint  Patient presents with  . Hypertension  . Headache    [name] Daniel is a 86 y.o. female.  The history is provided by the patient, a relative and medical records. No language interpreter was used.  Hypertension Associated symptoms include headaches.  Headache    86 year old female significant history of hypertension, CHF, asthma, dementia sent here via EMS from urgent care center for evaluation of headache high blood pressure.  Patient is pleasantly demented, limited history obtained from patient.  Additional history obtained from niece who is at bedside.  Patient has chronic infection of her lower extremities for the past several months.  She has been cared for at the wound care center.  Niece brought her to have a follow-up with her PCP over a week ago.  At that time, it was noticed that patient has elevated blood pressure in the 180s however after rechecked it was normalized and patient sent home.  Several days ago patient went to the wound care center and at that time her blood pressure was documented in the 259D systolic.  Her wound was cared for and she has been officially released from the wound care center.  Yesterday patient has her blood pressure checked at [facility] and it was 638 systolic.  Her niece was concerned and brought patient to the urgent care center for evaluation.  At that time blood pressure was documented in the 756E systolic and patient was transferred over to the ED for additional evaluation.  Patient otherwise without any significant plaints.  She does endorse intermittent headache.  She is unable to describe much of her headaches but states headache is minimal at this time.  There is no associated vision changes.  She does endorse occasional bouts of nausea without vomiting.  She endorsed low abdominal pain and has  been waxing waning and minimal at this time.  She does not complain of any focal numbness or focal weakness.  She denies any active chest pain or trouble breathing.  She is a poor historian though.  She has been vaccinated for COVID-19.  Family members mention that patient has been taking her 2 blood pressure medications as prescribed.  She lives at home, use a cane to walk.  She does not complain of any increasing pain to her lower extremities.  Her niece did reach out to patient's PCP who agrees for patient to be evaluated in the ED.  PCP also recommend increasing her hydralazine to 50 mg 3 times daily.  Patient has been waiting the ER waiting room for the past 17 hours prior to my evaluation.   Past Medical History:  Diagnosis Date  . Anemia, unspecified   . Anxiety   . Arthritis   . Asthma   . Delusions ([place])   . Depression   . Edema 2005   "since put the glass in them"   . GERD (gastroesophageal reflux disease)   . Lumbago   . Lymphedema   . Obstructive chronic bronchitis without exacerbation ([place])   . Other abnormal glucose   . 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 Systolic heart failure ([facility])    EF 35 to 40% per echo January 2015  . Unspecified essential hypertension   . Unspecified nonpsychotic mental disorder   . Unspecified venous (peripheral) insufficiency   . Unspecified venous (peripheral) insufficiency   . Unspecified  vitamin D deficiency     Patient Active Problem List   Diagnosis Date Noted  . Medication noncompliance due to cognitive impairment 10/17/2019  . Chronic pain of multiple joints 08/01/2017  . Age-related osteoporosis without current pathological fracture 08/01/2017  . Left knee pain 09/19/2016  . Headache 06/22/2015  . Rectal benign neoplasm   . Asthma, chronic 04/04/2015  . history of Tubular adenoma of colon 04/04/2015  . 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 Hypokalemia 04/04/2015  . CKD (chronic kidney disease) stage 3, GFR 30-59 ml/min  04/03/2015  . Dehydration 04/03/2015  . History of colonic polyps 12/08/2014  . Edema 04/14/2014  . Chronic systolic heart failure ([place]) 11/20/2013  . Nausea with vomiting 11/13/2013  . Lymphedema of lower extremity 10/17/2013  . 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 MGUS (monoclonal gammopathy of unknown significance) 05/15/2012  . Vitamin D deficiency 01/29/2010  . BURSITIS, LEFT ELBOW 01/29/2010  . DERMATITIS 09/30/2009  . DIABETES MELLITUS, BORDERLINE 01/11/2009  . Paranoid delusion ([place]) 08/02/2008  . Obstructive chronic bronchitis ([place]) 08/02/2008  . Essential hypertension 11/11/2007  . Venous (peripheral) insufficiency 11/11/2007  . RENAL CALCULUS 11/11/2007  . LOW BACK PAIN, CHRONIC 11/11/2007    Past Surgical History:  Procedure Laterality Date  . APPENDECTOMY    . CERVICAL SPINE SURGERY     x 2  . CHOLECYSTECTOMY    . ESOPHAGOGASTRODUODENOSCOPY N/A 04/05/2015   Procedure: ESOPHAGOGASTRODUODENOSCOPY (EGD);  Surgeon: [name], MD;  Location: [facility];  Service: Endoscopy;  Laterality: N/A;  . FLEXIBLE SIGMOIDOSCOPY N/A 04/05/2015   Procedure: FLEXIBLE SIGMOIDOSCOPY;  Surgeon: [name], MD;  Location: [facility] END[facility]COPY;  Service: Endoscopy;  Laterality: N/A;  . HAND SURGERY    . LUMBAR DISC SURGERY    . PARTIAL HYSTERECTOMY    . TONSILLECTOMY       OB History   No obstetric history on file.     Family History  Problem Relation Age of Onset  . Diabetes Mother   . Arthritis Father   . Diabetes Paternal Grandfather        entire family on both sides  . Heart disease Maternal Aunt        entire family of both sides  . Cancer Paternal Aunt        type unknown  . Diabetes Sister   . Breast cancer Cousin     Social History   Tobacco Use  . Smoking status: Never Smoker  . Smokeless tobacco: Never Used  Vaping Use  . Vaping Use: Never used  Substance Use Topics  . Alcohol use: No  . Drug use: No    Home Medications Prior to Admission  medications   Medication Sig Start Date End Date Taking? Authorizing Provider  acetaminophen (TYLENOL 8 HOUR) 650 MG CR tablet Take 1 tablet (650 mg total) by mouth 3 (three) times daily with meals. 05/21/18   Reed, Tiffany L, DO  albuterol (PROAIR HFA) 108 [phone] Base) MCG/ACT inhaler INHALE 2 PUFFS EVERY 4 HOURS AS NEEDED FOR WHEEZING 05/04/20   Lauree Chandler, NP  Ascorbic Acid (VITAMIN C) 1000 MG tablet 2 by mouth twice daily before lunch and supper  07/18/18   Gildardo Cranker, DO  calcium-vitamin D (OSCAL WITH D) 500-200 MG-UNIT tablet Take 1 tablet by mouth daily with breakfast. 05/21/18   Reed, Tiffany L, DO  carvedilol (COREG) 6.25 MG tablet Take 1 tablet twice daily in lunch and supper. 05/04/20   Lauree Chandler, NP  cetirizine (ZYRTEC) 10 MG tablet Take 1 tablet (10 mg total) by mouth daily. 05/04/20   Lauree Chandler, NP  denosumab (PROLIA) 60 MG/ML SOSY injection Inject 60 mg into the skin every 6 (six) months.    [provider]  FEROSUL 325 (65 Fe) MG tablet Take 1 tablet (325 mg total) by mouth daily with breakfast. 05/24/20   Lauree Chandler, NP  Ferrous Sulfate Dried 200 (65 Fe) MG TABS Take 1 tablet by mouth daily. 05/04/20   Lauree Chandler, NP  fluticasone-salmeterol (ADVAIR HFA) [phone] MCG/ACT inhaler INHALE 2 PUFFS BY MOUTH TWICE DAILY 05/04/20   Lauree Chandler, NP  hydrALAZINE (APRESOLINE) 25 MG tablet TAKE 1 TABLET(25 MG TOTAL) BY MOUTH TWICE DAILY AT 8 AM AND 10 PM 05/04/20   Eubanks, [name], NP  OLANZapine (ZYPREXA) 5 MG tablet Take 1 tablet (5 mg total) by mouth at bedtime. 05/04/20   Lauree Chandler, NP  omeprazole (PRILOSEC) 20 MG capsule Take 1 capsule (20 mg total) by mouth daily. 05/24/20   Lauree Chandler, NP  potassium chloride SA (KLOR-CON) 20 MEQ tablet Take 2 tablets (40 mEq total) by mouth 2 (two) times daily before lunch and supper. 10/17/19   Lauree Chandler, NP  torsemide (DEMADEX) 20 MG tablet Take 2 tablets (40 mg total) by mouth 2 (two)  times daily. Take 2 tabs at 8 am and 10 pm 05/04/20   Lauree Chandler, NP  triamcinolone cream (KENALOG) 0.1 % Apply 1 application topically 2 (two) times daily. 05/28/20   Lauree Chandler, NP  Vitamin D, Ergocalciferol, (DRISDOL) 1.25 MG (50000 UNIT) CAPS capsule Take 1 capsule (50,000 Units total) by mouth every Monday. 05/10/20   Lauree Chandler, NP    Allergies    Aspirin, Codeine, and Latex  Review of Systems   Review of Systems  Unable to perform ROS: Dementia  Neurological: Positive for headaches.  All other systems reviewed and are negative.   Physical Exam Updated Vital Signs BP (!) 194/97   Pulse 72   Temp (!) 97.5 F (36.4 C) (Oral)   Resp 12   SpO2 100%   Physical Exam Vitals and nursing note reviewed.  Constitutional:      General: She is not in acute distress.    Appearance: She is well-developed.  HENT:     Head: Atraumatic.  Eyes:     Extraocular Movements: Extraocular movements intact.     Conjunctiva/sclera: Conjunctivae normal.     Pupils: Pupils are equal, round, and reactive to light.  Cardiovascular:     Rate and Rhythm: Normal rate and regular rhythm.     Pulses: Normal pulses.     Heart sounds: Normal heart sounds.  Pulmonary:     Effort: Pulmonary effort is normal.     Breath sounds: Normal breath sounds.  Abdominal:     Palpations: Abdomen is soft.     Tenderness: There is no abdominal tenderness.  Musculoskeletal:     Cervical back: Normal range of motion and neck supple. No rigidity.     Comments: Dressing were noted to bilateral lower extremities with Ace wraps around it.  Brisk cap refill to distal toes bilaterally.  Lymphadenopathy:     Cervical: No cervical adenopathy.  Skin:    Findings: No rash.  Neurological:     Mental Status: She is alert.     GCS: GCS eye subscore is 4. GCS verbal subscore is 5. GCS motor subscore is 6.  Cranial Nerves: Cranial nerves are intact.     Motor: Motor function is intact.     Comments:  Patient is alert to self      ED Results / Procedures / Treatments   Labs (all labs ordered are listed, but only abnormal results are displayed) Labs Reviewed  BASIC METABOLIC PANEL - Abnormal; Notable for the following components:      Result Value   Creatinine, Ser 1.08 (*)    GFR calc non Af Amer 46 (*)    GFR calc Af Amer 54 (*)    All other components within normal limits  CBC - Abnormal; Notable for the following components:   Hemoglobin 10.3 (*)    HCT 35.4 (*)    MCHC 29.1 (*)    RDW 16.1 (*)    All other components within normal limits  URINALYSIS, ROUTINE W REFLEX MICROSCOPIC  CBG MONITORING, ED    EKG None  ED ECG REPORT   Date: 06/17/2020  Rate: 67  Rhythm: normal sinus rhythm  QRS Axis: normal  Intervals: normal  ST/T Wave abnormalities: normal  Conduction Disutrbances:none  Narrative Interpretation:   Old EKG Reviewed: unchanged  I have personally reviewed the EKG tracing and agree with the computerized printout as noted.   Radiology CT HEAD WO CONTRAST  Result Date: 06/16/2020 CLINICAL DATA:  Headache, chronic, no new features Dizziness, non-specific EXAM: CT HEAD WITHOUT CONTRAST TECHNIQUE: Contiguous axial images were obtained from the base of the skull through the vertex without intravenous contrast. COMPARISON:  Head CT 10/12/2016 FINDINGS: Brain: Age related atrophy. Mild chronic small vessel ischemia, similar to prior. No intracranial hemorrhage, mass effect, or midline shift. No hydrocephalus. The basilar cisterns are patent. No evidence of territorial infarct or acute ischemia. No extra-axial or intracranial fluid collection. Vascular: Atherosclerosis of skullbase vasculature without hyperdense vessel or abnormal calcification. Skull: No fracture or focal lesion. Sinuses/Orbits: Paranasal sinuses and mastoid air cells are clear. The visualized orbits are unremarkable. Other: None. IMPRESSION: 1. No acute intracranial abnormality. 2. Age related  atrophy and chronic small vessel ischemia. Electronically Signed   By: Keith Rake M.D.   On: 06/16/2020 20:25    Procedures Procedures (including critical care time)  Medications Ordered in ED Medications - No data to display  ED Course  I have reviewed the triage vital signs and the nursing notes.  Pertinent labs & imaging results that were available during my care of the patient were reviewed by me and considered in my medical decision making (see chart for details).    MDM Rules/Calculators/A&P                          BP (!) 194/9 (BP Location: Right Arm)   Pulse 72   Temp (!) 97.5 F (36.4 C) (Oral)   Resp 12   SpO2 100%   Final Clinical Impression(s) / ED Diagnoses Final diagnoses:  Hypertensive urgency    Rx / DC Orders ED Discharge Orders    None     11:47 AM On patient with history of hypertension currently on Coreg and hydralazine who was brought here due to evaluation of elevated blood pressure in the low 027X systolics despite being compliant with her blood pressure medication.  As she did report intermittent headaches but no active headache at this time.  She has no focal neuro deficit on exam and no acute thunderclap headache concerning for subarachnoid hemorrhage.  Her head CT scan showed  no acute intracranial abnormalities.  Her labs are mostly at baseline.  No active chest pain or shortness of breath to suggest ACS.  Her current blood pressure is 204/102.  She has not had her home medication due to being in the ER waiting room for the past 17 hours.  We will give labetalol 20 mg IV as well as hydralazine 50 mg p.o.  Care discussed with Dr. Wilson Singer.   1:10 PM Blood pressure much improved to 160/76. No evidence of hypertensive emergency. I felt this patient is stable for discharge with increasing her hydralazine dose and close follow-up with her PCP. Care discussed with Dr. Wilson Singer.   1:19 PM Patient's niece found out that patient have not been taking the  appropriate blood pressure medications as she was prescribed.  Apparently patient's nephew has been giving patient only half of the dosage that was prescribed.  Therefore, I strongly encourage patient to take the appropriate dosage and follow-up with her PCP in 1 week for recheck.  Return precaution discussed.  Otherwise at this time patient is stable for discharge.   Domenic Moras, PA-C 06/17/20 1323    Virgel Manifold, MD 06/20/20 [phone]

## 2020-06-17 NOTE — Telephone Encounter (Signed)
Patient went to [facility] and they sent her to ED via ambulance.

## 2020-06-17 NOTE — Telephone Encounter (Signed)
Im not able to get anyone to move their appointment for tomorrow to work patient in.  Please Advise.

## 2020-06-18 ENCOUNTER — Other Ambulatory Visit: Payer: Self-pay

## 2020-06-18 ENCOUNTER — Emergency Department (HOSPITAL_COMMUNITY)
Admission: EM | Admit: 2020-06-18 | Discharge: 2020-06-19 | Disposition: A | Discharge status: Home / Self Care | Payer: Medicare Other | Attending: Emergency Medicine | Admitting: Emergency Medicine

## 2020-06-18 ENCOUNTER — Telehealth: Payer: Self-pay

## 2020-06-18 ENCOUNTER — Encounter: Payer: Self-pay | Admitting: Adult Health

## 2020-06-18 ENCOUNTER — Telehealth: Payer: Self-pay | Admitting: *Deleted

## 2020-06-18 ENCOUNTER — Encounter (HOSPITAL_COMMUNITY): Payer: Self-pay | Admitting: Pediatrics

## 2020-06-18 ENCOUNTER — Ambulatory Visit (INDEPENDENT_AMBULATORY_CARE_PROVIDER_SITE_OTHER): Payer: Medicare Other | Admitting: Adult Health

## 2020-06-18 ENCOUNTER — Ambulatory Visit: Payer: Self-pay | Admitting: Nurse Practitioner

## 2020-06-18 VITALS — BP 138/62 | HR 61 | Temp 96.8°F | Ht <= 58 in | Wt 139.8 lb

## 2020-06-18 DIAGNOSIS — I130 Hypertensive heart and chronic kidney disease with heart failure and stage 1 through stage 4 chronic kidney disease, or unspecified chronic kidney disease: Secondary | ICD-10-CM | POA: Insufficient documentation

## 2020-06-18 DIAGNOSIS — R202 Paresthesia of skin: Secondary | ICD-10-CM | POA: Insufficient documentation

## 2020-06-18 DIAGNOSIS — J45909 Unspecified asthma, uncomplicated: Secondary | ICD-10-CM | POA: Insufficient documentation

## 2020-06-18 DIAGNOSIS — I5022 Chronic systolic (congestive) heart failure: Secondary | ICD-10-CM | POA: Insufficient documentation

## 2020-06-18 DIAGNOSIS — R200 Anesthesia of skin: Secondary | ICD-10-CM | POA: Diagnosis not present

## 2020-06-18 DIAGNOSIS — Z79899 Other long term (current) drug therapy: Secondary | ICD-10-CM | POA: Diagnosis not present

## 2020-06-18 DIAGNOSIS — I10 Essential (primary) hypertension: Secondary | ICD-10-CM | POA: Diagnosis not present

## 2020-06-18 DIAGNOSIS — Z7951 Long term (current) use of inhaled steroids: Secondary | ICD-10-CM | POA: Diagnosis not present

## 2020-06-18 DIAGNOSIS — N183 Chronic kidney disease, stage 3 unspecified: Secondary | ICD-10-CM | POA: Insufficient documentation

## 2020-06-18 LAB — COMPREHENSIVE METABOLIC PANEL
ALT: 9 U/L (ref 0–44)
AST: 16 U/L (ref 15–41)
Albumin: 3.1 g/dL — ABNORMAL LOW (ref 3.5–5.0)
Alkaline Phosphatase: 61 U/L (ref 38–126)
Anion gap: 6 (ref 5–15)
BUN: 22 mg/dL (ref 8–23)
CO2: 25 mmol/L (ref 22–32)
Calcium: 8.7 mg/dL — ABNORMAL LOW (ref 8.9–10.3)
Chloride: 108 mmol/L (ref 98–111)
Creatinine, Ser: 1.37 mg/dL — ABNORMAL HIGH (ref 0.44–1.00)
GFR calc Af Amer: 40 mL/min — ABNORMAL LOW (ref >60)
GFR calc non Af Amer: 35 mL/min — ABNORMAL LOW (ref >60)
Glucose, Bld: 84 mg/dL (ref 70–99)
Potassium: 4.1 mmol/L (ref 3.5–5.1)
Sodium: 139 mmol/L (ref 135–145)
Total Bilirubin: 0.4 mg/dL (ref 0.3–1.2)
Total Protein: 8.5 g/dL — ABNORMAL HIGH (ref 6.5–8.1)

## 2020-06-18 LAB — CBC WITH DIFFERENTIAL/PLATELET
Abs Immature Granulocytes: 0.01 10*3/uL (ref 0.00–0.07)
Basophils Absolute: 0 10*3/uL (ref 0.0–0.1)
Basophils Relative: 0 %
Eosinophils Absolute: 0 10*3/uL (ref 0.0–0.5)
Eosinophils Relative: 0 %
HCT: 35 % — ABNORMAL LOW (ref 36.0–46.0)
Hemoglobin: 10.3 g/dL — ABNORMAL LOW (ref 12.0–15.0)
Immature Granulocytes: 0 %
Lymphocytes Relative: 25 %
Lymphs Abs: 1.2 10*3/uL (ref 0.7–4.0)
MCH: 26 pg (ref 26.0–34.0)
MCHC: 29.4 g/dL — ABNORMAL LOW (ref 30.0–36.0)
MCV: 88.4 fL (ref 80.0–100.0)
Monocytes Absolute: 0.5 10*3/uL (ref 0.1–1.0)
Monocytes Relative: 11 %
Neutro Abs: 3 10*3/uL (ref 1.7–7.7)
Neutrophils Relative %: 64 %
Platelets: 226 10*3/uL (ref 150–400)
RBC: 3.96 MIL/uL (ref 3.87–5.11)
RDW: 16 % — ABNORMAL HIGH (ref 11.5–15.5)
WBC: 4.7 10*3/uL (ref 4.0–10.5)
nRBC: 0 % (ref 0.0–0.2)

## 2020-06-18 NOTE — Telephone Encounter (Signed)
Apolonio Schneiders called and stated that patient has taken her medications for the morning and they took her Blood pressure about 30 minutes afterwards and patient's blood pressure is 171/85.  Stated she is nervous about it going up. No appointment for today.  Please Advise.

## 2020-06-18 NOTE — Telephone Encounter (Signed)
Called and spoke with [name], she stated that [name]'s Blood pressure has gone down. She gave blood pressure medication, but still having tingling in right arm. No face drooping, or slurred speech.She is ansering question that they ask her. [name] stated that her husband used the FAST method to evaluate for signs of stroke.She has not given her her other medications at this point, but will once she has eaten.[name] has an appointment at 3:30 today with Jaymes Graff, NP and they will be in office then.

## 2020-06-18 NOTE — ED Triage Notes (Signed)
Reported concern from right sided numbness and high blood pressure. Stated recently seen for same issue.

## 2020-06-18 NOTE — Telephone Encounter (Signed)
I called Apolonio Schneiders to get update on patient.   She stated that it was not long after I called yesterday that they took patient back for evaluation.  Stated that they feel like patient was stressed and had anxiety with all the Dr. Visits she had in one week.   Also Apolonio Schneiders noticed that patient was not getting her evening blood pressure pill at all. So they did not increase the medication in the ER because they did not want it to drop too low. They will start giving that evening pill and keep a BP log which they will bring to OV appointment next week.   ER got her BP down to 160/69. Patient is at home today and resting well.

## 2020-06-18 NOTE — Telephone Encounter (Signed)
Pt was seen in office today

## 2020-06-18 NOTE — Telephone Encounter (Signed)
Great - thank you for the update.

## 2020-06-18 NOTE — Progress Notes (Addendum)
[facility]  Provider:  Jaymes Graff Medina-Vargas  Code Status:  Full Code  Goals of Care:  Advanced Directives 05/25/2020  Does Patient Have a Medical Advance Directive? Yes  Type of Advance Directive [place]  Does patient want to make changes to medical advance directive? No - Patient declined  Copy of [place] in Chart? Yes - validated most recent copy scanned in chart (See row information)  Would patient like information on creating a medical advance directive? -     Chief Complaint  Patient presents with  . Acute Visit    Patient having issues with elevated blood pressure. It was determined that she was not getting her PM dosing of medication. She is accompanied by her niece.     HPI: Patient is a 86 y.o. female seen today for an acute visit for elevated BP. BP this morning was 171/85 on her right arm and 163/84 on her left arm. Later on during the day, her BP was 147/79. She was seen in the ED on 06/17/20 for uncontrolled hypertension. Of note, she was seen in the urgent care on 06/16/20 for elevated BP and was sent to ED. CT scan done on 06/16/20 showed no acute intracranial abnormality and has age related atrophy and chronic small vessel ischemia. Apparently, according to her niece, she wasn't taking her medications right. She was taking Coreg 6.25 mg only once a day instead of twice a day, and she was taking her Hydralazine 25 mg once a day instead of twice a day. During the clinic visit, patient complained of her bilateral hand numbness and gotten up on her right arm up to her right head. Bilateral hand grips are weak.  According to her niece, she started complaining of bilateral hand and right arm numbness this morning. She ambulates with a cane.   Past Medical History:  Diagnosis Date  . Anemia, unspecified   . Anxiety   . Arthritis   . Asthma   . Delusions ([place])   . Depression   . Edema 2005   "since put the glass in them"   . GERD  (gastroesophageal reflux disease)   . Lumbago   . Lymphedema   . Obstructive chronic bronchitis without exacerbation ([place])   . Other abnormal glucose   . 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 Systolic heart failure ([facility])    EF 35 to 40% per echo January 2015  . Unspecified essential hypertension   . Unspecified nonpsychotic mental disorder   . Unspecified venous (peripheral) insufficiency   . Unspecified venous (peripheral) insufficiency   . Unspecified vitamin D deficiency     Past Surgical History:  Procedure Laterality Date  . APPENDECTOMY    . CERVICAL SPINE SURGERY     x 2  . CHOLECYSTECTOMY    . ESOPHAGOGASTRODUODENOSCOPY N/A 04/05/2015   Procedure: ESOPHAGOGASTRODUODENOSCOPY (EGD);  Surgeon: [name], MD;  Location: [facility];  Service: Endoscopy;  Laterality: N/A;  . FLEXIBLE SIGMOIDOSCOPY N/A 04/05/2015   Procedure: FLEXIBLE SIGMOIDOSCOPY;  Surgeon: [name], MD;  Location: [facility];  Service: Endoscopy;  Laterality: N/A;  . HAND SURGERY    . LUMBAR DISC SURGERY    . PARTIAL HYSTERECTOMY    . TONSILLECTOMY      Allergies  Allergen Reactions  . Aspirin Other (See Comments)    REACTION: upset stomach  . Codeine Other (See Comments)    REACTION: "MAKES MY BODY GO CRAZY; CRAMPS"  . Latex Nausea And  Vomiting    Outpatient Encounter Medications as of 06/18/2020  Medication Sig  . acetaminophen (TYLENOL 8 HOUR) 650 MG CR tablet Take 1 tablet (650 mg total) by mouth 3 (three) times daily with meals.  [name] albuterol (PROAIR HFA) 108 (90 Base) MCG/ACT inhaler INHALE 2 PUFFS EVERY 4 HOURS AS NEEDED FOR WHEEZING  . Ascorbic Acid (VITAMIN C) 1000 MG tablet 2 by mouth twice daily before lunch and supper   . calcium-vitamin D (OSCAL WITH D) 500-200 MG-UNIT tablet Take 1 tablet by mouth daily with breakfast.  . carvedilol (COREG) 6.25 MG tablet Take 1 tablet twice daily in lunch and supper.  . cetirizine (ZYRTEC) 10 MG tablet Take 1 tablet (10 mg total) by mouth  daily.  [name] denosumab (PROLIA) 60 MG/ML SOSY injection Inject 60 mg into the skin every 6 (six) months.  . FEROSUL 325 (65 Fe) MG tablet Take 1 tablet (325 mg total) by mouth daily with breakfast.  . Ferrous Sulfate Dried 200 (65 Fe) MG TABS Take 1 tablet by mouth daily.  . fluticasone-salmeterol (ADVAIR HFA) 115-21 MCG/ACT inhaler INHALE 2 PUFFS BY MOUTH TWICE DAILY  . hydrALAZINE (APRESOLINE) 25 MG tablet TAKE 1 TABLET(25 MG TOTAL) BY MOUTH TWICE DAILY AT 8 AM AND 10 PM  . OLANZapine (ZYPREXA) 5 MG tablet Take 1 tablet (5 mg total) by mouth at bedtime.  [name] omeprazole (PRILOSEC) 20 MG capsule Take 1 capsule (20 mg total) by mouth daily.  . potassium chloride SA (KLOR-CON) 20 MEQ tablet Take 2 tablets (40 mEq total) by mouth 2 (two) times daily before lunch and supper.  . torsemide (DEMADEX) 20 MG tablet Take 2 tablets (40 mg total) by mouth 2 (two) times daily. Take 2 tabs at 8 am and 10 pm  . triamcinolone cream (KENALOG) 0.1 % Apply 1 application topically 2 (two) times daily.  . Vitamin D, Ergocalciferol, (DRISDOL) 1.25 MG (50000 UNIT) CAPS capsule Take 1 capsule (50,000 Units total) by mouth every Monday.   No facility-administered encounter medications on file as of 06/18/2020.    Review of Systems:  Review of Systems  Constitutional: Negative for activity change, appetite change and fever.  HENT: Negative for congestion and sore throat.   Eyes: Negative.   Respiratory: Negative for cough and shortness of breath.   Cardiovascular: Positive for leg swelling.  Gastrointestinal: Negative for abdominal distention, abdominal pain, blood in stool and constipation.  Endocrine: Negative.   Genitourinary: Negative for difficulty urinating and dysuria.  Musculoskeletal: Negative for back pain.  Skin: Negative for color change.  Neurological: Positive for weakness, numbness and headaches. Negative for speech difficulty.  Psychiatric/Behavioral: Negative for agitation and behavioral problems.     Health Maintenance  Topic Date Due  . INFLUENZA VACCINE  05/30/2020  . TETANUS/TDAP  04/06/2025  . DEXA SCAN  Completed  . COVID-19 Vaccine  Completed  . PNA vac Low Risk Adult  Completed    Physical Exam: Vitals:   06/18/20 1554  Weight: 139 lb 12.8 oz (63.4 kg)  Height: 4\' 10"  (1.473 m)   Body mass index is 29.22 kg/m. Physical Exam Constitutional:      Appearance: Normal appearance.  HENT:     Head: Normocephalic.     Mouth/Throat:     Mouth: Mucous membranes are moist.  Cardiovascular:     Rate and Rhythm: Normal rate and regular rhythm.     Pulses: Normal pulses.     Heart sounds: Normal heart sounds.  Pulmonary:  Effort: Pulmonary effort is normal.     Breath sounds: Normal breath sounds.  Abdominal:     General: Bowel sounds are normal.     Palpations: Abdomen is soft.  Musculoskeletal:        General: Swelling present. Normal range of motion.     Cervical back: Normal range of motion.     Right lower leg: Edema present.     Comments: RLE 2+ and LLE 2+edema, wrapped with ace bandage  Skin:    General: Skin is warm and dry.  Neurological:     Mental Status: She is alert and oriented to person, place, and time. Mental status is at baseline.     Comments: Numbness and tingling on bilateral hands and right arm and right-sided headache  Psychiatric:        Mood and Affect: Mood normal.        Behavior: Behavior normal.        Thought Content: Thought content normal.        Judgment: Judgment normal.     Labs reviewed: Basic Metabolic Panel: Recent Labs    05/24/20 1508 05/26/20 0959 06/16/20 1843  NA 137 140 139  K 3.9 4.1 4.2  CL 101 105 108  CO2 27 27 22   GLUCOSE 145* 85 88  BUN 26* 26* 16  CREATININE 1.54* 1.30* 1.08*  CALCIUM 9.2 8.7 8.9   Liver Function Tests: Recent Labs    05/24/20 1508 05/26/20 0959  AST 12 12  ALT 7 6  BILITOT 0.3 0.3  PROT 7.9 8.1   Recent Labs    05/24/20 1508 05/26/20 0959  LIPASE 33 24  AMYLASE  295* 147*   No results for input(s): AMMONIA in the last 8760 hours. CBC: Recent Labs    10/17/19 1549 10/17/19 1549 05/24/20 1508 05/26/20 0959 06/16/20 1843  WBC 5.9   < > 5.6 4.5 4.3  NEUTROABS 4,083  --  4,049 2,925  --   HGB 9.5*   < > 9.6* 10.0* 10.3*  HCT 31.0*   < > 30.9* 32.5* 35.4*  MCV 85.6   < > 87.0 86.2 90.1  PLT 305   < > 293 290 201   < > = values in this interval not displayed.   Lipid Panel: No results for input(s): CHOL, HDL, LDLCALC, TRIG, CHOLHDL, LDLDIRECT in the last 8760 hours. Lab Results  Component Value Date   HGBA1C 5.5 04/14/2014    Procedures since last visit: CT HEAD WO CONTRAST  Result Date: 06/16/2020 CLINICAL DATA:  Headache, chronic, no new features Dizziness, non-specific EXAM: CT HEAD WITHOUT CONTRAST TECHNIQUE: Contiguous axial images were obtained from the base of the skull through the vertex without intravenous contrast. COMPARISON:  Head CT 10/12/2016 FINDINGS: Brain: Age related atrophy. Mild chronic small vessel ischemia, similar to prior. No intracranial hemorrhage, mass effect, or midline shift. No hydrocephalus. The basilar cisterns are patent. No evidence of territorial infarct or acute ischemia. No extra-axial or intracranial fluid collection. Vascular: Atherosclerosis of skullbase vasculature without hyperdense vessel or abnormal calcification. Skull: No fracture or focal lesion. Sinuses/Orbits: Paranasal sinuses and mastoid air cells are clear. The visualized orbits are unremarkable. Other: None. IMPRESSION: 1. No acute intracranial abnormality. 2. Age related atrophy and chronic small vessel ischemia. Electronically Signed   By: Keith Rake M.D.   On: 06/16/2020 20:25   US Abdomen Complete  Result Date: 05/26/2020 CLINICAL DATA:  Abdominal pain.  Elevated amylase EXAM: ABDOMEN ULTRASOUND COMPLETE COMPARISON:  None.  FINDINGS: Gallbladder: Prior cholecystectomy Common bile duct: Diameter: Normal caliber, 3 mm Liver: 9 mm cyst in  the right lobe. Heterogeneous, mildly increased echotexture suggesting fatty infiltration. Portal vein is patent on color Doppler imaging with normal direction of blood flow towards the liver. IVC: No abnormality visualized. Pancreas: Visualized portion unremarkable. Spleen: Size and appearance within normal limits. Right Kidney: Length: 8.4 cm. Small cysts, the largest 11 mm. Mildly increased echotexture. No hydronephrosis. Left Kidney: Length: 8.6 cm. 1.3 cm cyst in the lower pole. Mildly increased echotexture. No hydronephrosis. Abdominal aorta: No aneurysm visualized. Other findings: None. IMPRESSION: Increased echotexture throughout the liver suggests fatty infiltration. Small bilateral renal cysts. Increased echotexture in the kidneys compatible with chronic medical renal disease. Electronically Signed   By: Rolm Baptise M.D.   On: 05/26/2020 09:57    Assessment/Plan  1. Numbness and tingling in both hands -  This numbness and tingling are new symptoms for her and started getting worse during the clinic visit. She started having right-sided headache with no visual disturbance - will send to ED for further evaluation due to possible acute stroke - niece was instructed to bring her to ED -  ED was advised regarding her transfer   Labs/tests ordered:  None  Next appt:  06/25/2020  [place], MSN, FNP-BC [place] [phone] (Monday-Friday 8:00 a.m. - 5:00 p.m.) [phone] (after hours)

## 2020-06-19 DIAGNOSIS — I130 Hypertensive heart and chronic kidney disease with heart failure and stage 1 through stage 4 chronic kidney disease, or unspecified chronic kidney disease: Secondary | ICD-10-CM | POA: Diagnosis not present

## 2020-06-19 DIAGNOSIS — I10 Essential (primary) hypertension: Secondary | ICD-10-CM | POA: Diagnosis not present

## 2020-06-19 MED ORDER — CLONIDINE HCL 0.1 MG PO TABS
0.1000 mg | ORAL_TABLET | Freq: Once | ORAL | Status: AC
  Administered 2020-06-19: 0.1 mg via ORAL
  Filled 2020-06-19: qty 1

## 2020-06-19 MED ORDER — CARVEDILOL 3.125 MG PO TABS: 6.2500 mg | ORAL_TABLET | Freq: Once | ORAL | Status: DC

## 2020-06-19 MED ORDER — HYDRALAZINE HCL 25 MG PO TABS: 25.0000 mg | ORAL_TABLET | Freq: Once | ORAL | Status: DC

## 2020-06-19 NOTE — ED Provider Notes (Signed)
I saw and evaluated the patient, reviewed the resident's note and I agree with the findings and plan.  Pertinent History: An 86 year old female presents with some intermittent tingling and significant hypertension, she is on hydralazine 25 mg twice a day, the patient has absolutely no complaints at this time, family member at the bedside wishes the blood pressure under better control but is not noticed any other significant changes including speech facial droop or weakness.  Pertinent Exam findings: On exam this patient is in good spirits, interactive, memory is poor, vital signs show blood pressure of 157/74 after treatment with patient's home medications.  I was personally present and directly supervised the following procedures:  Medical evaluation  Increase hydralazine dose, follow-up outpatient, patient and family member agreeable, no signs of acute stroke at this time  I personally interpreted the EKG as well as the resident and agree with the interpretation on the resident's chart.  Final diagnoses:  Chronic hypertension      Noemi Chapel, MD 06/20/20 [phone]

## 2020-06-19 NOTE — ED Provider Notes (Signed)
[facility] EMERGENCY DEPARTMENT Provider Note   CSN: [id number] Arrival date & time: 06/18/20  1725     History Chief Complaint  Patient presents with  . Numbness    [name] is a 86 y.o. female.  HPI   Patient presents to the emergency department from primary care for concern over numbness and tingling.  The patient has recently been having issues with blood pressure control and was seen in the emergency department on 8/18.  Work-up at that time was largely unremarkable and the patient was subsequently discharged after adequate blood pressure control was obtained; labs at that time were baseline.  At the primary care clinic, the patient reported right-sided numbness tingling in the arm as well as some right-sided headache; there was concern by the APP at that time regarding stroke and she was sent to the emergency department for evaluation.  By the time of my evaluation of the patient, she had been in the emergency department for approximately 18 hours prior to my interactions.  Patient was largely asymptomatic with minimal complaints.  The family member at bedside reports the patient has had some intermittent tingling in the bilateral hands and right arm.  Patient denies any chest pain or shortness of breath.  No nausea vomiting or diarrhea.  No recent illnesses.  Has received COVID vaccination x2.  Patient normally ambulates with a cane.  Significant chronic lymphedema. Home BP medications given this AM @ 0700 in lobby.      Past Medical History:  Diagnosis Date  . Anemia, unspecified   . Anxiety   . Arthritis   . Asthma   . Delusions ([place])   . Depression   . Edema 2005   "since put the glass in them"   . GERD (gastroesophageal reflux disease)   . Lumbago   . Lymphedema   . Obstructive chronic bronchitis without exacerbation ([place])   . Other abnormal glucose   . 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 Systolic heart failure (HCC)    EF 35 to 40% per  echo January 2015  . Unspecified essential hypertension   . Unspecified nonpsychotic mental disorder   . Unspecified venous (peripheral) insufficiency   . Unspecified venous (peripheral) insufficiency   . Unspecified vitamin D deficiency     Patient Active Problem List   Diagnosis Date Noted  . Medication noncompliance due to cognitive impairment 10/17/2019  . Chronic pain of multiple joints 08/01/2017  . Age-related osteoporosis without current pathological fracture 08/01/2017  . Left knee pain 09/19/2016  . Headache 06/22/2015  . Rectal benign neoplasm   . Asthma, chronic 04/04/2015  . history of Tubular adenoma of colon 04/04/2015  . 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 Hypokalemia 04/04/2015  . CKD (chronic kidney disease) stage 3, GFR 30-59 ml/min 04/03/2015  . Dehydration 04/03/2015  . History of colonic polyps 12/08/2014  . Edema 04/14/2014  . Chronic systolic heart failure ([place]) 11/20/2013  . Nausea with vomiting 11/13/2013  . Lymphedema of lower extremity 10/17/2013  . 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 MGUS (monoclonal gammopathy of unknown significance) 05/15/2012  . Vitamin D deficiency 01/29/2010  . BURSITIS, LEFT ELBOW 01/29/2010  . DERMATITIS 09/30/2009  . DIABETES MELLITUS, BORDERLINE 01/11/2009  . Paranoid delusion ([place]) 08/02/2008  . Obstructive chronic bronchitis ([place]) 08/02/2008  . Essential hypertension 11/11/2007  . Venous (peripheral) insufficiency 11/11/2007  . RENAL CALCULUS 11/11/2007  . LOW BACK PAIN, CHRONIC 11/11/2007    Past Surgical History:  Procedure  Laterality Date  . APPENDECTOMY    . CERVICAL SPINE SURGERY     x 2  . CHOLECYSTECTOMY    . ESOPHAGOGASTRODUODENOSCOPY N/A 04/05/2015   Procedure: ESOPHAGOGASTRODUODENOSCOPY (EGD);  Surgeon: [name], MD;  Location: [facility];  Service: Endoscopy;  Laterality: N/A;  . FLEXIBLE SIGMOIDOSCOPY N/A 04/05/2015   Procedure: FLEXIBLE SIGMOIDOSCOPY;  Surgeon: [name],  MD;  Location: [facility];  Service: Endoscopy;  Laterality: N/A;  . HAND SURGERY    . LUMBAR DISC SURGERY    . PARTIAL HYSTERECTOMY    . TONSILLECTOMY       OB History   No obstetric history on file.     Family History  Problem Relation Age of Onset  . Diabetes Mother   . Arthritis Father   . Diabetes Paternal Grandfather        entire family on both sides  . Heart disease Maternal Aunt        entire family of both sides  . Cancer Paternal Aunt        type unknown  . Diabetes Sister   . Breast cancer Cousin     Social History   Tobacco Use  . Smoking status: Never Smoker  . Smokeless tobacco: Never Used  Vaping Use  . Vaping Use: Never used  Substance Use Topics  . Alcohol use: No  . Drug use: No    Home Medications Prior to Admission medications   Medication Sig Start Date End Date Taking? Authorizing Provider  acetaminophen (TYLENOL 8 HOUR) 650 MG CR tablet Take 1 tablet (650 mg total) by mouth 3 (three) times daily with meals. 05/21/18   Reed, Tiffany L, DO  albuterol (PROAIR HFA) 108 [phone] Base) MCG/ACT inhaler INHALE 2 PUFFS EVERY 4 HOURS AS NEEDED FOR WHEEZING 05/04/20   Lauree Chandler, NP  Ascorbic Acid (VITAMIN C) 1000 MG tablet 2 by mouth twice daily before lunch and supper  07/18/18   Gildardo Cranker, DO  calcium-vitamin D (OSCAL WITH D) 500-200 MG-UNIT tablet Take 1 tablet by mouth daily with breakfast. 05/21/18   Reed, Tiffany L, DO  carvedilol (COREG) 6.25 MG tablet Take 1 tablet twice daily in lunch and supper. 05/04/20   Lauree Chandler, NP  cetirizine (ZYRTEC) 10 MG tablet Take 1 tablet (10 mg total) by mouth daily. 05/04/20   Lauree Chandler, NP  denosumab (PROLIA) 60 MG/ML SOSY injection Inject 60 mg into the skin every 6 (six) months.    [provider]  FEROSUL 325 (65 Fe) MG tablet Take 1 tablet (325 mg total) by mouth daily with breakfast. 05/24/20   Lauree Chandler, NP  Ferrous Sulfate Dried 200 (65 Fe) MG TABS Take 1 tablet by mouth  daily. 05/04/20   Lauree Chandler, NP  fluticasone-salmeterol (ADVAIR HFA) [phone] MCG/ACT inhaler INHALE 2 PUFFS BY MOUTH TWICE DAILY 05/04/20   Lauree Chandler, NP  hydrALAZINE (APRESOLINE) 25 MG tablet TAKE 1 TABLET(25 MG TOTAL) BY MOUTH TWICE DAILY AT 8 AM AND 10 PM 05/04/20   Eubanks, [name], NP  OLANZapine (ZYPREXA) 5 MG tablet Take 1 tablet (5 mg total) by mouth at bedtime. 05/04/20   Lauree Chandler, NP  omeprazole (PRILOSEC) 20 MG capsule Take 1 capsule (20 mg total) by mouth daily. 05/24/20   Lauree Chandler, NP  potassium chloride SA (KLOR-CON) 20 MEQ tablet Take 2 tablets (40 mEq total) by mouth 2 (two) times daily before lunch and supper. 10/17/19  Lauree Chandler, NP  torsemide (DEMADEX) 20 MG tablet Take 2 tablets (40 mg total) by mouth 2 (two) times daily. Take 2 tabs at 8 am and 10 pm 05/04/20   Lauree Chandler, NP  triamcinolone cream (KENALOG) 0.1 % Apply 1 application topically 2 (two) times daily. 05/28/20   Lauree Chandler, NP  Vitamin D, Ergocalciferol, (DRISDOL) 1.25 MG (50000 UNIT) CAPS capsule Take 1 capsule (50,000 Units total) by mouth every Monday. 05/10/20   Lauree Chandler, NP    Allergies    Aspirin, Codeine, and Latex  Review of Systems   Review of Systems  Constitutional: Negative for chills and fever.  HENT: Negative for ear pain and sore throat.   Eyes: Negative for pain and visual disturbance.  Respiratory: Negative for cough and shortness of breath.   Cardiovascular: Negative for chest pain and palpitations.  Gastrointestinal: Negative for abdominal pain, diarrhea, nausea and vomiting.  Genitourinary: Negative for dysuria and hematuria.  Musculoskeletal: Negative for arthralgias and back pain.  Skin: Negative for color change and rash.  Neurological: Positive for numbness. Negative for seizures and syncope.  All other systems reviewed and are negative.   Physical Exam Updated Vital Signs BP (!) 157/74   Pulse (!) 57   Temp 98 F  (36.7 C) (Oral)   Resp 20   SpO2 100%   Physical Exam Vitals and nursing note reviewed.  Constitutional:      General: She is not in acute distress.    Appearance: Normal appearance. She is well-developed and normal weight. She is not ill-appearing or toxic-appearing.  HENT:     Head: Normocephalic and atraumatic.  Eyes:     Extraocular Movements: Extraocular movements intact.     Conjunctiva/sclera: Conjunctivae normal.     Pupils: Pupils are equal, Daniel, and reactive to light.  Cardiovascular:     Rate and Rhythm: Normal rate and regular rhythm.     Pulses: Normal pulses.     Heart sounds: No murmur heard.   Pulmonary:     Effort: Pulmonary effort is normal. No respiratory distress.     Breath sounds: Normal breath sounds.  Abdominal:     General: There is no distension.     Palpations: Abdomen is soft.     Tenderness: There is no abdominal tenderness.  Musculoskeletal:     Cervical back: Normal range of motion and neck supple. No rigidity.  Skin:    General: Skin is warm and dry.     Capillary Refill: Capillary refill takes less than 2 seconds.  Neurological:     General: No focal deficit present.     Mental Status: She is alert. Mental status is at baseline.     GCS: GCS eye subscore is 4. GCS verbal subscore is 5. GCS motor subscore is 6.     Motor: No weakness, abnormal muscle tone or seizure activity.  Psychiatric:        Mood and Affect: Mood normal.        Behavior: Behavior normal.     ED Results / Procedures / Treatments   Labs (all labs ordered are listed, but only abnormal results are displayed) Labs Reviewed  CBC WITH DIFFERENTIAL/PLATELET - Abnormal; Notable for the following components:      Result Value   Hemoglobin 10.3 (*)    HCT 35.0 (*)    MCHC 29.4 (*)    RDW 16.0 (*)    All other components within normal limits  COMPREHENSIVE METABOLIC  PANEL - Abnormal; Notable for the following components:   Creatinine, Ser 1.37 (*)    Calcium 8.7 (*)     Total Protein 8.5 (*)    Albumin 3.1 (*)    GFR calc non Af Amer 35 (*)    GFR calc Af Amer 40 (*)    All other components within normal limits    EKG None  Radiology No results found.  Procedures Procedures (including critical care time)  Medications Ordered in ED Medications  cloNIDine (CATAPRES) tablet 0.1 mg (0.1 mg Oral Given 06/19/20 1157)    ED Course   [name] is a 86 y.o. female with PMHx listed that presents to the Emergency Department complaint of Numbness  ED Course: Initial exam completed.   Well appearing and hemodynamically stable.  Nontoxic and afebrile.  Physical exam significant for pleasant 86 year old female with no focal neurologic deficits on my examination, extremities well perfused, chronic bilateral lower extremity edema, and abdomen soft, nondistended, nontender.  Initial differential includes stroke/TIA, hypertensive urgency, hypertensive emergency, chronic hypertension and electrolyte abnormalities.  Triage work-up reviewed.   CBC without evidence of leukocytosis or leukopenia and stable hemoglobin although anemic 10.3 which appears baseline.  CMP with no acute electrolyte abnormalities requiring urgent intervention; creatinine 1.37 which appears similar to the patient's baseline which is variable.  Low concern for acute stroke/TIA at this time; reassuring neurologic examination and atypical presentation with bilateral tingling.  Family more concerned about BP control.  Reviewed labs, no evidence of endorgan dysfunction.  0.1 mg clonidine given.  Home medications reviewed, will plan to uptitrate clonidine to 50 mg twice daily which can be further titrated by PCP vs new medication. Discussed BP checks once daily.    Diagnostics Vital Signs: reviewed Labs: reviewed and significant findings discussed above Imaging: personally reviewed images interpreted by radiology EKG: reviewed Records: nursing notes along with previous records reviewed and  pertinent data discussed   Consults:  none   Reevaluation/Disposition:  Upon reevaluation, patients symptoms stable. No active nausea/vomiting prior to discharge from the emergency department.    All questions answered.  Strict return precautions were discussed. Additionally we discussed establishing and/or following-up with primary care physician.  Patient and/or family was understanding and in agreement with today's assessment and plan.   Sherolyn Buba, MD Emergency Medicine, PGY-3   Note: Dragon medical dictation software was used in the creation of this note.   Final Clinical Impression(s) / ED Diagnoses Final diagnoses:  Chronic hypertension    Rx / DC Orders ED Discharge Orders    None       Frann Rider, MD 06/20/20 2836    Noemi Chapel, MD 06/20/20 [phone]

## 2020-06-19 NOTE — Discharge Instructions (Addendum)
Increase your hydralazine dose to 50 mg (2 tablets) twice daily until follow-up with your PCP. At that time, you may discuss additional treatment options or medication changes.   Return to ED for any worsening or concerning symptoms.

## 2020-06-19 NOTE — ED Notes (Signed)
Patient verbalizes understanding of discharge instructions. Opportunity for questioning and answers were provided. Armband removed by staff. Patient discharged from ED.  

## 2020-06-22 ENCOUNTER — Ambulatory Visit: Payer: Medicare Other | Admitting: Occupational Therapy

## 2020-06-24 ENCOUNTER — Ambulatory Visit: Payer: Medicare Other | Admitting: Occupational Therapy

## 2020-06-25 ENCOUNTER — Other Ambulatory Visit: Payer: Self-pay

## 2020-06-25 ENCOUNTER — Ambulatory Visit (INDEPENDENT_AMBULATORY_CARE_PROVIDER_SITE_OTHER): Payer: Medicare Other | Admitting: Nurse Practitioner

## 2020-06-25 ENCOUNTER — Encounter: Payer: Self-pay | Admitting: Nurse Practitioner

## 2020-06-25 ENCOUNTER — Ambulatory Visit: Payer: Medicare Other | Admitting: Nurse Practitioner

## 2020-06-25 VITALS — BP 140/78 | HR 63 | Temp 96.0°F | Ht <= 58 in | Wt 138.6 lb

## 2020-06-25 DIAGNOSIS — I10 Essential (primary) hypertension: Secondary | ICD-10-CM

## 2020-06-25 DIAGNOSIS — E559 Vitamin D deficiency, unspecified: Secondary | ICD-10-CM | POA: Diagnosis not present

## 2020-06-25 DIAGNOSIS — J45909 Unspecified asthma, uncomplicated: Secondary | ICD-10-CM | POA: Diagnosis not present

## 2020-06-25 DIAGNOSIS — I890 Lymphedema, not elsewhere classified: Secondary | ICD-10-CM

## 2020-06-25 DIAGNOSIS — J4489 Other specified chronic obstructive pulmonary disease: Secondary | ICD-10-CM

## 2020-06-25 DIAGNOSIS — J449 Chronic obstructive pulmonary disease, unspecified: Secondary | ICD-10-CM | POA: Diagnosis not present

## 2020-06-25 DIAGNOSIS — I5022 Chronic systolic (congestive) heart failure: Secondary | ICD-10-CM | POA: Diagnosis not present

## 2020-06-25 MED ORDER — HYDRALAZINE HCL 50 MG PO TABS: ORAL_TABLET | ORAL | 1 refills | Status: DC

## 2020-06-25 MED ORDER — ADVAIR HFA 115-21 MCG/ACT IN AERO: INHALATION_SPRAY | RESPIRATORY_TRACT | 5 refills | Status: DC

## 2020-06-25 MED ORDER — VITAMIN D (CHOLECALCIFEROL) 25 MCG (1000 UT) PO CAPS: 1.0000 | ORAL_CAPSULE | Freq: Every day | ORAL | Status: DC

## 2020-06-25 MED ORDER — TORSEMIDE 20 MG PO TABS: 20.0000 mg | ORAL_TABLET | Freq: Every day | ORAL | 1 refills | Status: DC

## 2020-06-25 MED ORDER — POTASSIUM CHLORIDE CRYS ER 20 MEQ PO TBCR: 20.0000 meq | EXTENDED_RELEASE_TABLET | Freq: Every day | ORAL | 3 refills | Status: DC

## 2020-06-25 NOTE — Patient Instructions (Signed)
Vit d 1000 units otc   Start using advair 2 puff twice daily ROUTINELY  Restart fluid pill (demadex)  Take potassium with Demadex

## 2020-06-25 NOTE — Addendum Note (Signed)
Addended by: Lauree Chandler on: 06/25/2020 04:34 PM   Modules accepted: Orders

## 2020-06-25 NOTE — Progress Notes (Signed)
Careteam: Patient Care Team: Lauree Chandler, NP as PCP - General (Geriatric Medicine) [name], MD (Pulmonary Disease) Jacolyn Reedy, MD as Consulting Physician (Cardiology)  PLACE OF SERVICE:  [place]  Advanced Directive information    Allergies  Allergen Reactions  . Aspirin Other (See Comments)    REACTION: upset stomach  . Codeine Other (See Comments)    REACTION: "MAKES MY BODY GO CRAZY; CRAMPS"  . Latex Nausea And Vomiting    Chief Complaint  Patient presents with  . Follow-up    Blood pressure follow-up, discuss ER increasing dose   . Referral    Home health referral   . Medication Management    Discuss Advair, should patient use daily or as needed. Patient out of fluid pill x 1-2 weeks and out of kenalog cream.   . Medication Refill    Refill Vit D      HPI: Patient is a 86 y.o. female for blood pressure follow up.   Hydralazine was increased to 2 tablets twice daily- blood pressure at home was monitored prior to medication given.  Also taking coreg 6.25 mg twice daily She has been out of her demadex for over a week Continues to take potassium but only taking 1 tablet daily Has only been using demadex 1 tablet daily   Has dry skin- using eucerin which has been beneficial      Review of Systems:  Review of Systems  Constitutional: Negative for chills, fever and weight loss.  HENT: Negative for tinnitus.   Respiratory: Negative for cough, sputum production and shortness of breath.   Cardiovascular: Positive for leg swelling. Negative for chest pain and palpitations.  Gastrointestinal: Negative for abdominal pain, constipation, diarrhea and heartburn.  Genitourinary: Negative for dysuria, frequency and urgency.  Musculoskeletal: Negative for back pain, falls, joint pain and myalgias.  Skin: Positive for itching. Negative for rash.  Neurological: Negative for dizziness and headaches.  Psychiatric/Behavioral: Positive for memory loss.  Negative for depression. The patient does not have insomnia.     Past Medical History:  Diagnosis Date  . Anemia, unspecified   . Anxiety   . Arthritis   . Asthma   . Delusions ([place])   . Depression   . Edema 2005   "since put the glass in them"   . GERD (gastroesophageal reflux disease)   . Lumbago   . Lymphedema   . Obstructive chronic bronchitis without exacerbation ([place])   . Other abnormal glucose   . 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 Systolic heart failure (HCC)    EF 35 to 40% per echo January 2015  . Unspecified essential hypertension   . Unspecified nonpsychotic mental disorder   . Unspecified venous (peripheral) insufficiency   . Unspecified venous (peripheral) insufficiency   . Unspecified vitamin D deficiency    Past Surgical History:  Procedure Laterality Date  . APPENDECTOMY    . CERVICAL SPINE SURGERY     x 2  . CHOLECYSTECTOMY    . ESOPHAGOGASTRODUODENOSCOPY N/A 04/05/2015   Procedure: ESOPHAGOGASTRODUODENOSCOPY (EGD);  Surgeon: [name], MD;  Location: [facility];  Service: Endoscopy;  Laterality: N/A;  . FLEXIBLE SIGMOIDOSCOPY N/A 04/05/2015   Procedure: FLEXIBLE SIGMOIDOSCOPY;  Surgeon: [name], MD;  Location: [facility];  Service: Endoscopy;  Laterality: N/A;  . HAND SURGERY    . LUMBAR DISC SURGERY    . PARTIAL HYSTERECTOMY    . TONSILLECTOMY     Social History:  reports that she has never smoked. She has never used smokeless tobacco. She reports that she does not drink alcohol and does not use drugs.  Family History  Problem Relation Age of Onset  . Diabetes Mother   . Arthritis Father   . Diabetes Paternal Grandfather        entire family on both sides  . Heart disease Maternal Aunt        entire family of both sides  . Cancer Paternal Aunt        type unknown  . Diabetes Sister   . Breast cancer Cousin     Medications: Patient's Medications  New Prescriptions   No medications on file  Previous Medications    ACETAMINOPHEN (TYLENOL) 650 MG CR TABLET    Take 650 mg by mouth as needed for pain.   ALBUTEROL (PROAIR HFA) 108 (90 BASE) MCG/ACT INHALER    INHALE 2 PUFFS EVERY 4 HOURS AS NEEDED FOR WHEEZING   ASCORBIC ACID (VITAMIN C) 1000 MG TABLET    2 by mouth twice daily before lunch and supper    CALCIUM-VITAMIN D (OSCAL WITH D) 500-200 MG-UNIT TABLET    Take 1 tablet by mouth daily with breakfast.   CARVEDILOL (COREG) 6.25 MG TABLET    Take 1 tablet twice daily in lunch and supper.   CETIRIZINE (ZYRTEC) 10 MG TABLET    Take 1 tablet (10 mg total) by mouth daily.   DENOSUMAB (PROLIA) 60 MG/ML SOSY INJECTION    Inject 60 mg into the skin every 6 (six) months.   FEROSUL 325 (65 FE) MG TABLET    Take 1 tablet (325 mg total) by mouth daily with breakfast.   FLUTICASONE-SALMETEROL (ADVAIR HFA) 115-21 MCG/ACT INHALER    INHALE 2 PUFFS BY MOUTH TWICE DAILY   HYDRALAZINE (APRESOLINE) 25 MG TABLET    TAKE 1 TABLET(25 MG TOTAL) BY MOUTH TWICE DAILY AT 8 AM AND 10 PM   OLANZAPINE (ZYPREXA) 5 MG TABLET    Take 1 tablet (5 mg total) by mouth at bedtime.   OMEPRAZOLE (PRILOSEC) 20 MG CAPSULE    Take 1 capsule (20 mg total) by mouth daily.   POTASSIUM CHLORIDE SA (KLOR-CON) 20 MEQ TABLET    Take 2 tablets (40 mEq total) by mouth 2 (two) times daily before lunch and supper.   TORSEMIDE (DEMADEX) 20 MG TABLET    Take 2 tablets (40 mg total) by mouth 2 (two) times daily. Take 2 tabs at 8 am and 10 pm   TRIAMCINOLONE CREAM (KENALOG) 0.1 %    Apply 1 application topically 2 (two) times daily.   VITAMIN D, ERGOCALCIFEROL, (DRISDOL) 1.25 MG (50000 UNIT) CAPS CAPSULE    Take 1 capsule (50,000 Units total) by mouth every Monday.  Modified Medications   No medications on file  Discontinued Medications   ACETAMINOPHEN (TYLENOL 8 HOUR) 650 MG CR TABLET    Take 1 tablet (650 mg total) by mouth 3 (three) times daily with meals.   FERROUS SULFATE DRIED 200 (65 FE) MG TABS    Take 1 tablet by mouth daily.    Physical  Exam:  Vitals:   06/25/20 1503  BP: 140/78  Pulse: 63  Temp: (!) 96 F (35.6 C)  TempSrc: Temporal  SpO2: 99%  Weight: 138 lb 9.6 oz (62.9 kg)  Height: 4\' 10"  (1.473 m)   Body mass index is 28.97 kg/m. Wt Readings from Last 3 Encounters:  06/25/20 138 lb 9.6 oz (62.9 kg)  06/18/20 139 lb 12.8 oz (63.4 kg)  05/24/20 139 lb (63 kg)    Physical Exam Constitutional:      General: She is not in acute distress.    Appearance: She is well-developed. She is not diaphoretic.  HENT:     Head: Normocephalic and atraumatic.     Mouth/Throat:     Pharynx: No oropharyngeal exudate.  Eyes:     Conjunctiva/sclera: Conjunctivae normal.     Pupils: Pupils are equal, round, and reactive to light.  Cardiovascular:     Rate and Rhythm: Normal rate and regular rhythm.     Heart sounds: Normal heart sounds.  Pulmonary:     Effort: Pulmonary effort is normal.     Breath sounds: Wheezing (scattered wheezing throughout) present.  Abdominal:     General: Bowel sounds are normal.     Palpations: Abdomen is soft.  Musculoskeletal:        General: No tenderness.     Cervical back: Normal range of motion and neck supple.     Right lower leg: Edema present.     Left lower leg: Edema present.     Comments: LE edema, ace wrapped, left < right   Skin:    General: Skin is warm and dry.  Neurological:     Mental Status: She is alert. Mental status is at baseline.  Psychiatric:        Mood and Affect: Mood normal.     Labs reviewed: Basic Metabolic Panel: Recent Labs    05/26/20 0959 06/16/20 1843 06/18/20 1824  NA 140 139 139  K 4.1 4.2 4.1  CL 105 108 108  CO2 27 22 25   GLUCOSE 85 88 84  BUN 26* 16 22  CREATININE 1.30* 1.08* 1.37*  CALCIUM 8.7 8.9 8.7*   Liver Function Tests: Recent Labs    05/24/20 1508 05/26/20 0959 06/18/20 1824  AST 12 12 16   ALT 7 6 9   ALKPHOS  --   --  61  BILITOT 0.3 0.3 0.4  PROT 7.9 8.1 8.5*  ALBUMIN  --   --  3.1*   Recent Labs     05/24/20 1508 05/26/20 0959  LIPASE 33 24  AMYLASE 295* 147*   No results for input(s): AMMONIA in the last 8760 hours. CBC: Recent Labs    05/24/20 1508 05/24/20 1508 05/26/20 0959 06/16/20 1843 06/18/20 1824  WBC 5.6   < > 4.5 4.3 4.7  NEUTROABS 4,049  --  2,925  --  3.0  HGB 9.6*   < > 10.0* 10.3* 10.3*  HCT 30.9*   < > 32.5* 35.4* 35.0*  MCV 87.0   < > 86.2 90.1 88.4  PLT 293   < > 290 201 226   < > = values in this interval not displayed.   Lipid Panel: No results for input(s): CHOL, HDL, LDLCALC, TRIG, CHOLHDL, LDLDIRECT in the last 8760 hours. TSH: No results for input(s): TSH in the last 8760 hours. A1C: Lab Results  Component Value Date   HGBA1C 5.5 04/14/2014     Assessment/Plan 1. Chronic asthma, unspecified asthma severity, unspecified whether complicated, unspecified whether persistent To use advair as prescribed - fluticasone-salmeterol (ADVAIR HFA) 115-21 MCG/ACT inhaler; INHALE 2 PUFFS BY MOUTH TWICE DAILY  Dispense: 36 g; Refill: 5  2. Essential hypertension -improved control. Continue coreg BID and hydralazine 50 mg BID. - hydrALAZINE (APRESOLINE) 50 MG tablet; TAKE 1 TABLET(50 MG TOTAL) BY MOUTH TWICE DAILY AT 8 AM AND 10  PM  Dispense: 180 tablet; Refill: 1  3. Lymphedema of both lower extremities - torsemide (DEMADEX) 20 MG tablet; Take 1 tablet (20 mg total) by mouth daily.  Dispense: 90 tablet; Refill: 1 - potassium chloride SA (KLOR-CON) 20 MEQ tablet; Take 1 tablet (20 mEq total) by mouth daily.  Dispense: 180 tablet; Refill: 3  4. Chronic systolic heart failure (HCC) -has been out of Demadex for over a week, stress importance of compliance with medication. No worsening of shortness of breath, swelling or chest pains noted.  Also instructed to take potassium with Demadex (has been only taking potassium) she is only using 1 tablet daily, will change medication list to reflect this.   - torsemide (DEMADEX) 20 MG tablet; Take 1 tablet (20 mg  total) by mouth daily.  Dispense: 90 tablet; Refill: 1 - potassium chloride SA (KLOR-CON) 20 MEQ tablet; Take 1 tablet (20 mEq total) by mouth daily.  Dispense: 180 tablet; Refill: 3 - BASIC METABOLIC PANEL WITH GFR  5. Obstructive chronic bronchitis ([place]) -not taking Advair as prescribed, encouraged compliance.   - fluticasone-salmeterol (ADVAIR HFA) 115-21 MCG/ACT inhaler; INHALE 2 PUFFS BY MOUTH TWICE DAILY  Dispense: 36 g; Refill: 5  6. Vitamin D deficiency Vit d level at goal, encouraged to start vit d 1000 units daily.   Next appt: 09/27/2020 [name]. [place], [facility] [phone]

## 2020-06-26 NOTE — Progress Notes (Signed)
[name], [name] ([id number]) Visit Report for 06/14/2020 Allergy List Details Patient Name: Date of Service: [name], [name] [facility] RA C. 06/14/2020 2:45 PM Medical Record Number: [id number] Patient Account Number: [id number] Date of Birth/Sex: Treating RN: 05-17-1932 (86 y.o. Orvan Falconer Primary Care Basil Buffin: Sherrie Mustache Other Clinician: Referring Lexie Koehl: Treating Gray Maugeri/Extender: Deloria Lair in Treatment: 0 Allergies Active Allergies aspirin Reaction: upset stomach Severity: Moderate codeine Reaction: "makes by body go crazy; cramps" Severity: Moderate latex Reaction: nausea and vomiting Severity: Moderate Allergy Notes Electronic Signature(s) Signed: 06/25/2020 5:50:16 PM By: Carlene Coria RN Entered By: Carlene Coria on 06/14/2020 15:45:37 -------------------------------------------------------------------------------- Arrival Information Details Patient Name: Date of Service: [name] RBA RA C. 06/14/2020 2:45 PM Medical Record Number: [id number] Patient Account Number: [id number] Date of Birth/Sex: Treating RN: [birth year] (86 y.o. Orvan Falconer Primary Care [name]: Sherrie Mustache Other Clinician: Referring [name]: Treating [name]/Extender: Deloria Lair in Treatment: 0 Visit Information Patient Arrived: Ambulatory Arrival Time: 15:41 Accompanied By: self Transfer Assistance: None Patient Identification Verified: Yes Secondary Verification Process Completed: Yes Patient Requires Transmission-Based Precautions: No Patient Has Alerts: No History Since Last Visit All ordered tests and consults were completed: No Added or deleted any medications: No Any new allergies or adverse reactions: No Had a fall or experienced change in activities of daily living that may affect risk of falls: No Signs or symptoms of abuse/neglect since last visito No Hospitalized since last visit: No Implantable device outside  of the clinic excluding cellular tissue based products placed in the center since last visit: No Pain Present Now: No Electronic Signature(s) Signed: 06/25/2020 5:50:16 PM By: Carlene Coria RN Entered By: Carlene Coria on 06/14/2020 15:41:48 -------------------------------------------------------------------------------- Clinic Level of Care Assessment Details Patient Name: Date of Service: [name], [name] RA C. 06/14/2020 2:45 PM Medical Record Number: [id number] Patient Account Number: [id number] Date of Birth/Sex: Treating RN: Feb 06, 1932 (86 y.o. [name] Primary Care [name]: Sherrie Mustache Other Clinician: Referring [name]: Treating [name]/Extender: Deloria Lair in Treatment: 0 Clinic Level of Care Assessment Items TOOL 2 Quantity Score X- 1 0 Use when only an EandM is performed on the INITIAL visit ASSESSMENTS - Nursing Assessment / Reassessment X- 1 20 General Physical Exam (combine w/ comprehensive assessment (listed just below) when performed on new pt. evals) X- 1 25 Comprehensive Assessment (HX, ROS, Risk Assessments, Wounds Hx, etc.) ASSESSMENTS - Wound and Skin A ssessment / Reassessment []  - 0 Simple Wound Assessment / Reassessment - one wound []  - 0 Complex Wound Assessment / Reassessment - multiple wounds []  - 0 Dermatologic / Skin Assessment (not related to wound area) ASSESSMENTS - Ostomy and/or Continence Assessment and Care []  - 0 Incontinence Assessment and Management []  - 0 Ostomy Care Assessment and Management (repouching, etc.) PROCESS - Coordination of Care X - Simple Patient / Family Education for ongoing care 1 15 []  - 0 Complex (extensive) Patient / Family Education for ongoing care X- 1 10 Staff obtains Programmer, systems, Records, T Results / Process Orders est []  - 0 Staff telephones HHA, Nursing Homes / Clarify orders / etc []  - 0 Routine Transfer to another Facility (non-emergent condition) []  - 0 Routine  Hospital Admission (non-emergent condition) X- 1 15 New Admissions / Biomedical engineer / Ordering NPWT Apligraf, etc. , []  - 0 Emergency Hospital Admission (emergent condition) X- 1 10 Simple Discharge Coordination []  - 0 Complex (extensive) Discharge Coordination PROCESS - Special Needs []  - 0 Pediatric / Minor Patient Management []  -  0 Isolation Patient Management []  - 0 Hearing / Language / Visual special needs []  - 0 Assessment of Community assistance (transportation, D/C planning, etc.) []  - 0 Additional assistance / Altered mentation []  - 0 Support Surface(s) Assessment (bed, cushion, seat, etc.) INTERVENTIONS - Wound Cleansing / Measurement []  - 0 Wound Imaging (photographs - any number of wounds) []  - 0 Wound Tracing (instead of photographs) []  - 0 Simple Wound Measurement - one wound []  - 0 Complex Wound Measurement - multiple wounds []  - 0 Simple Wound Cleansing - one wound []  - 0 Complex Wound Cleansing - multiple wounds INTERVENTIONS - Wound Dressings []  - 0 Small Wound Dressing one or multiple wounds []  - 0 Medium Wound Dressing one or multiple wounds []  - 0 Large Wound Dressing one or multiple wounds []  - 0 Application of Medications - injection INTERVENTIONS - Miscellaneous []  - 0 External ear exam []  - 0 Specimen Collection (cultures, biopsies, blood, body fluids, etc.) []  - 0 Specimen(s) / Culture(s) sent or taken to Lab for analysis []  - 0 Patient Transfer (multiple staff / Civil Service fast streamer / Similar devices) []  - 0 Simple Staple / Suture removal (25 or less) []  - 0 Complex Staple / Suture removal (26 or more) []  - 0 Hypo / Hyperglycemic Management (close monitor of Blood Glucose) []  - 0 Ankle / Brachial Index (ABI) - do not check if billed separately Has the patient been seen at the hospital within the last three years: Yes Total Score: 95 Level Of Care: New/Established - Level 3 Electronic Signature(s) Signed: 06/14/2020 5:14:32 PM  By: Levan Hurst RN, BSN Entered By: Levan Hurst on 06/14/2020 17:08:31 -------------------------------------------------------------------------------- Encounter Discharge Information Details Patient Name: Date of Service: [name] RBA RA C. 06/14/2020 2:45 PM Medical Record Number: [id number] Patient Account Number: [id number] Date of Birth/Sex: Treating RN: [birth year] (86 y.o. Elam Dutch Primary Care Lakara Weiland: Sherrie Mustache Other Clinician: Referring Talyia Allende: Treating Arbie Blankley/Extender: Deloria Lair in Treatment: 0 Encounter Discharge Information Items Discharge Condition: Stable Ambulatory Status: Cane Discharge Destination: Home Transportation: Private Auto Accompanied By: niece Schedule Follow-up Appointment: Yes Clinical Summary of Care: Patient Declined Electronic Signature(s) Signed: 06/15/2020 5:25:51 PM By: Baruch Gouty RN, BSN Entered By: Baruch Gouty on 06/14/2020 16:34:55 -------------------------------------------------------------------------------- Pain Assessment Details Patient Name: Date of Service: [name] RBA RA C. 06/14/2020 2:45 PM Medical Record Number: [id number] Patient Account Number: [id number] Date of Birth/Sex: Treating RN: 1932/06/10 (86 y.o. Orvan Falconer Primary Care Yobana Culliton: Sherrie Mustache Other Clinician: Referring Aishia Barkey: Treating Maejor Erven/Extender: Deloria Lair in Treatment: 0 Active Problems Location of Pain Severity and Description of Pain Patient Has Paino No Site Locations Pain Management and Medication Current Pain Management: Electronic Signature(s) Signed: 06/25/2020 5:50:16 PM By: Carlene Coria RN Entered By: Carlene Coria on 06/14/2020 15:52:45 -------------------------------------------------------------------------------- Patient/Caregiver Education Details Patient Name: Date of Service: [name] 8/16/2021andnbsp2:45 PM Medical  Record Number: [id number] Patient Account Number: [id number] Date of Birth/Gender: Treating RN: 05/29/1932 (86 y.o. [name] Primary Care Physician: Sherrie Mustache Other Clinician: Referring Physician: Treating Physician/Extender: Deloria Lair in Treatment: 0 Education Assessment Education Provided To: Patient Education Topics Provided Wound/Skin Impairment: Methods: Explain/Verbal Responses: State content correctly Electronic Signature(s) Signed: 06/14/2020 5:14:32 PM By: Levan Hurst RN, BSN Entered By: Levan Hurst on 06/14/2020 17:08:02 -------------------------------------------------------------------------------- [place] Details Patient Name: Date of Service: [name] RBA RA C. 06/14/2020 2:45 PM Medical Record Number: [id number] Patient Account Number: [id number] Date of Birth/Sex: Treating  RN: Jul 19, 1932 (86 y.o. Orvan Falconer Primary Care Cora Stetson: Sherrie Mustache Other Clinician: Referring Kenny Stern: Treating Vergene Marland/Extender: Deloria Lair in Treatment: 0 Vital Signs Time Taken: 14:55 Temperature (F): 98 Height (in): 62 Pulse (bpm): 76 Source: Stated Respiratory Rate (breaths/min): 18 Weight (lbs): 134 Blood Pressure (mmHg): 190/101 Source: Stated Reference Range: 80 - 120 mg / dl Body Mass Index (BMI): 24.5 Electronic Signature(s) Signed: 06/25/2020 5:50:16 PM By: Carlene Coria RN Entered By: Carlene Coria on 06/14/2020 15:45:33

## 2020-06-26 NOTE — Progress Notes (Signed)
MURLINE, WEIGEL ([id number]) Visit Report for 06/14/2020 Abuse/Suicide Risk Screen Details Patient Name: Date of Service: [name], [name] RA C. 06/14/2020 2:45 PM Medical Record Number: [id number] Patient Account Number: [id number] Date of Birth/Sex: Treating RN: 12-03-1931 (86 y.o. Orvan Falconer Primary Care Misa Fedorko: Sherrie Mustache Other Clinician: Referring Raksha Wolfgang: Treating Evgenia Merriman/Extender: Deloria Lair in Treatment: 0 Abuse/Suicide Risk Screen Items Answer ABUSE RISK SCREEN: Has anyone close to you tried to hurt or harm you recentlyo No Do you feel uncomfortable with anyone in your familyo No Has anyone forced you do things that you didnt want to doo No Electronic Signature(s) Signed: 06/25/2020 5:50:16 PM By: Carlene Coria RN Entered By: Carlene Coria on 06/14/2020 15:46:24 -------------------------------------------------------------------------------- Activities of Daily Living Details Patient Name: Date of Service: [name], Sophia RA C. 06/14/2020 2:45 PM Medical Record Number: [id number] Patient Account Number: [id number] Date of Birth/Sex: Treating RN: 10-07-1932 (86 y.o. Orvan Falconer Primary Care Mikella Linsley: Sherrie Mustache Other Clinician: Referring Junita Kubota: Treating Daisja Kessinger/Extender: Deloria Lair in Treatment: 0 Activities of Daily Living Items Answer Activities of Daily Living (Please select one for each item) Drive Automobile Not Able T Medications ake Need Assistance Use T elephone Need Assistance Care for Appearance Need Assistance Use T oilet Need Assistance Bath / Shower Need Assistance Dress Self Need Assistance Feed Self Completely Able Walk Completely Able Get In / Out Bed Completely Able Housework Not Able Prepare Meals Not Able Handle Money Need Assistance Shop for Self Not Able Electronic Signature(s) Signed: 06/25/2020 5:50:16 PM By: Carlene Coria RN Entered By: Carlene Coria on 06/14/2020  15:46:56 -------------------------------------------------------------------------------- Education Screening Details Patient Name: Date of Service: Sophia Sack RBA RA C. 06/14/2020 2:45 PM Medical Record Number: [id number] Patient Account Number: [id number] Date of Birth/Sex: Treating RN: 12/30/1931 (86 y.o. Orvan Falconer Primary Care Remi Lopata: Sherrie Mustache Other Clinician: Referring Kenidee Cregan: Treating Alvine Mostafa/Extender: Deloria Lair in Treatment: 0 Primary Learner Assessed: Patient Learning Preferences/Education Level/Primary Language Learning Preference: Explanation Highest Education Level: High School Preferred Language: English Cognitive Barrier Language Barrier: No Translator Needed: No Memory Deficit: No Emotional Barrier: No Cultural/Religious Beliefs Affecting Medical Care: No Physical Barrier Impaired Vision: No Impaired Hearing: No Decreased Hand dexterity: No Knowledge/Comprehension Knowledge Level: Medium Comprehension Level: High Ability to understand written instructions: High Ability to understand verbal instructions: High Motivation Anxiety Level: Anxious Cooperation: Cooperative Education Importance: Acknowledges Need Interest in Health Problems: Asks Questions Perception: Coherent Willingness to Engage in Self-Management Medium Activities: Readiness to Engage in Self-Management Medium Activities: Electronic Signature(s) Signed: 06/25/2020 5:50:16 PM By: Carlene Coria RN Entered By: Carlene Coria on 06/14/2020 15:48:28 -------------------------------------------------------------------------------- Fall Risk Assessment Details Patient Name: Date of Service: Sophia Sack RBA RA C. 06/14/2020 2:45 PM Medical Record Number: [id number] Patient Account Number: [id number] Date of Birth/Sex: Treating RN: 1932/01/01 (86 y.o. Orvan Falconer Primary Care Reeshemah Nazaryan: Sherrie Mustache Other Clinician: Referring Zakariah Urwin: Treating  Modelle Vollmer/Extender: Deloria Lair in Treatment: 0 Fall Risk Assessment Items Have you had 2 or more falls in the last 12 monthso 0 No Have you had any fall that resulted in injury in the last 12 monthso 0 No FALLS RISK SCREEN History of falling - immediate or within 3 months 0 No Secondary diagnosis (Do you have 2 or more medical diagnoseso) 0 No Ambulatory aid None/bed rest/wheelchair/nurse 0 No Crutches/cane/walker 0 No Furniture 0 No Intravenous therapy Access/Saline/Heparin Lock 0 No Gait/Transferring Normal/ bed rest/ wheelchair 0 No Weak (short steps with or without  shuffle, stooped but able to lift head while walking, may seek 0 No support from furniture) Impaired (short steps with shuffle, may have difficulty arising from chair, head down, impaired 0 No balance) Mental Status Oriented to own ability 0 No Electronic Signature(s) Signed: 06/25/2020 5:50:16 PM By: Carlene Coria RN Entered By: Carlene Coria on 06/14/2020 15:48:36 -------------------------------------------------------------------------------- Foot Assessment Details Patient Name: Date of Service: Sophia Sack RBA RA C. 06/14/2020 2:45 PM Medical Record Number: [id number] Patient Account Number: [id number] Date of Birth/Sex: Treating RN: 1932-01-12 (86 y.o. Orvan Falconer Primary Care Hill Mackie: Sherrie Mustache Other Clinician: Referring Aizlyn Schifano: Treating Nefertari Rebman/Extender: Deloria Lair in Treatment: 0 Foot Assessment Items Site Locations + = Sensation present, - = Sensation absent, C = Callus, U = Ulcer R = Redness, W = Warmth, M = Maceration, PU = Pre-ulcerative lesion F = Fissure, S = Swelling, D = Dryness Assessment Right: Left: Other Deformity: No No Prior Foot Ulcer: No No Prior Amputation: No No Charcot Joint: No No Ambulatory Status: Ambulatory With Help Assistance Device: Cane Gait: Steady Electronic Signature(s) Signed: 06/25/2020 5:50:16 PM  By: Carlene Coria RN Entered By: Carlene Coria on 06/14/2020 15:52:22 -------------------------------------------------------------------------------- Nutrition Risk Screening Details Patient Name: Date of Service: [name], Sophia RA C. 06/14/2020 2:45 PM Medical Record Number: [id number] Patient Account Number: [id number] Date of Birth/Sex: Treating RN: Sep 23, 1932 (86 y.o. Orvan Falconer Primary Care Mikhai Bienvenue: Sherrie Mustache Other Clinician: Referring Jasiah Buntin: Treating Burney Calzadilla/Extender: Deloria Lair in Treatment: 0 Height (in): 62 Weight (lbs): 134 Body Mass Index (BMI): 24.5 Nutrition Risk Screening Items Score Screening NUTRITION RISK SCREEN: I have an illness or condition that made me change the kind and/or amount of food I eat 0 No I eat fewer than two meals per day 0 No I eat few fruits and vegetables, or milk products 0 No I have three or more drinks of beer, liquor or wine almost every day 0 No I have tooth or mouth problems that make it hard for me to eat 0 No I don't always have enough money to buy the food I need 0 No I eat alone most of the time 0 No I take three or more different prescribed or over-the-counter drugs a day 0 No Without wanting to, I have lost or gained 10 pounds in the last six months 0 No I am not always physically able to shop, cook and/or feed myself 0 No Nutrition Protocols Good Risk Protocol Moderate Risk Protocol High Risk Proctocol Risk Level: Good Risk Score: 0 Electronic Signature(s) Signed: 06/25/2020 5:50:16 PM By: Carlene Coria RN Entered By: Carlene Coria on 06/14/2020 15:48:44

## 2020-06-26 NOTE — Progress Notes (Signed)
[name], [name] ([id number]) Visit Report for 06/14/2020 Chief Complaint Document Details Patient Name: Date of Service: [name], [name] [place] RA C. 06/14/2020 2:45 PM Medical Record Number: [id number] Patient Account Number: [id number] Date of Birth/Sex: Treating RN: [name] (86 y.o. [name] Primary Care Provider: Sherrie Daniel Other Clinician: Referring Provider: Treating Provider/Extender: [name] in Treatment: 0 Information Obtained from: Patient Chief Complaint Bilateral LE Lymphedema with right leg ulcer 8/16; patient returns to clinic out of concern for bilateral lower extremity swelling Electronic Signature(s) Signed: 06/14/2020 5:53:04 PM By: Sophia Ham MD Entered By: [name] on 06/14/2020 16:50:30 -------------------------------------------------------------------------------- HPI Details Patient Name: Date of Service: [name] RBA RA C. 06/14/2020 2:45 PM Medical Record Number: [id number] Patient Account Number: [id number] Date of Birth/Sex: Treating RN: [birth year] (86 y.o. [name] Primary Care Provider: Sherrie Daniel Other Clinician: Referring Provider: Treating Provider/Extender: [name] in Treatment: 0 History of Present Illness HPI Description: 11/26/15; the patient arrives today I'm not exactly sure who referred her. She is here for our review of bilateral edema of her left greater than right legs. She asked me right away if we could wrap them. I note that she has a history of chronic stage III renal failure. She is noted to have chronic venous insufficiency and lymphedema. I really don't have much more of history here. Dr. [name] who is her cardiologist notes chronic systolic heart failure, her echocardiogram in January 2015 showed an ejection fraction of 35-40%. I do not see any formal venous or arterial studies. She is not a listed diabetic via her problem list in  [place] link. As far as discussing the edema in her legs with the patient she really gives a very circumferential history that is hard to follow. I am not sure that she is really had previous wounds in her legs. There is an asymmetry with the left leg being a lot more edematous than the right I am not sure of the history here either. Almost from her initial intake here. She made a series of paranoid sounding statements that made our intake nurse is concerned that she Sophia be at risk. Most of this directed against the son-in-law. 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 She is apparently refused psychiatric evaluation. 12/30/15; the patient arrives here having presumably missed her one-week follow-up appointment today. I'm not sure how things got this far out to. In any case she never received the stockings that we ordered last time. She continues to have a significant lipid lymphedema of her left leg down to her foot. I try to go over the history of this with her, it is clear that there is some chronicity to this problem but I'm not really sure how long she has had this it is at least several years as she states her husband died 2 weeks ago and that point the edema in the left leg was a lot more than now 02/11/16; the patient arrives accompanied by her daughter who apparently has been staying with her but who his primary residence is in [place]. I'm not completely certain why she didn't return here. We had ordered her Juzo stockings and her last visit in early March patient arrives saying that the company called and they are no longer making these. She is apparently being seen Monday Wednesday and Friday at the lymphedema clinic in [place].  The daughter states that she is being taught to do the lymphedema wraps however makes it [place] aware that she is returning to [place] on Friday. Apparently  a nephew will be taking her to the lymphedema clinic, but nobody seems to think that the patient can actually do this. In the meantime she appears to have no open wounds on her bilateral lower legs with careful inspection 02/14/16; this is a patient I discharged on Friday. That she was going to follow up with the lymphedema clinic in [place]. We did order her juxtalite stockings. Sometime over the weekend the left leg has become much larger and somewhat more painful. She was seen today in the lymphedema clinic and they refused to wrap her leg. There are really still no open areas here 02/18/16: This patient I discharged last week however she came to [place] from the lymphedema clinic in [facility] with uncontrolled edema of the left leg. A duplex ultrasound of the leg was negative for DVT that we arrange and then we rewrapped her leg with a Profore light. We have attempted on at least 2 occasions to get juxtalite stockings in her on however for one reason or another. 02/25/16; patient has no open wounds. She has not yet obtained her juxtalite's although family was present and insisted they've been ordered and paid for READMISSION 07/09/18 This is a now 86 year old woman who comes to clinic accompanied by her nephew. She was last in our clinic in nearly part of 2017. At that time she did not appear to have open wounds on her legs either. There appeared to be a constellation of problems related to obtaining lower extremity stockings, uncontrolled edema etc. She also had a duplex ultrasound that was negative for DVT We discharged her in juxta light stockings which the patient states she is used for . some period of time but more recently is using some form of lower extremity compression stocking. She apparently developed problems late last month and saw her primary doctor Dr. Gildardo Cranker for bilateral cellulitis of her lower extremity. She has since been followed by home health and they've been  putting Unna boots on her. She is supposed to go to see the lymphedema clinic in [place] later in the week and apparently they wanted [place] to look at this to make sure there were no open areas. Also problematic is that you cannot be seen at the lymphedema clinic with home health therefore apparently home health is discharging on Friday i.e. in 3 days' time The patient has a history of lymphedema left greater than right, psychosis NYD, anemia, anxiety, hypertension and venous insufficiency. She has not had any arterial studies READMISSION 10/08/2018 The patient is back in clinic today with again bilateral lower extremity lymphedema. She was here in 2017 for lymphedema and we discharged her with juxta lites. I do not know that she ever really wore these. For the last several months patient has been going to the lymphedema clinic in [place]. They have been wrapping her 3 times a week. Apparently they placed this on Thursday and the patient took it off on Sunday and there were noted to be wounds on her bilateral lower extremities also what looks to be excoriations from scratching although the patient denied it. They went into the clinic on Monday and the occupational therapist there would not replace the wraps because there was open areas. The patient is complaining of itching but no pain. PMhx; Her past medical history is otherwise unchanged she has  lymphedema left greater than right, psychosis NYD, anxiety, hypertension and venous insufficiency. ABIs in our clinic were noncompressible bilaterally 10/25/2018; 2-week follow-up. The patient has no open wounds. Her edema is under good control. She has an appointment early in January with the lymphedema clinic in [place]. Readmission: 08/20/2019 patient is seen today for reevaluation here in our clinic concerning issues she is having with recurrent openings specifically on the right lower extremity she has bilateral lower extremity lymphedema.  We did order her juxta light compression last time she was here unfortunately she does not know where those are at this point she says they were misplaced. With that being said there is no signs of active infection she does have some drainage on the right lower extremity. Fortunately there is no evidence of active infection at this time which is good news she has a little bit of pain at this location where she is draining. No fevers, chills, nausea, vomiting, or diarrhea. 09/03/2019 on evaluation today patient appears to be doing decently well with regard to her lower extremities although she does have a small wound which is new on the left lower extremity and her right lower extremity is still open as well. She is having some pain and she inquired about pain medication. With that being said I explained to the patient and her family member that we do not actually prescribe pain medications here for the wound care center we normally defer to the primary care or potentially pain management as a referral if were unable to gain help for the patient through the primary care provider. With that being said I am more than happy to send a note to them this afternoon just to see if they will be able to help out with her pain management control for short-term. No fevers, chills, nausea, vomiting, or diarrhea. READMISSION 03/04/2020; This is an 86 year old woman we have had multiple times in this clinic previously. She has lymphedema and a history of wounds in her lower extremities. She was here in 2017, 2019 for 2 visits and then again for 2 visits in 2020. We last saw her the wound was on the left leg. Our listings show that she has bilateral juxta lites. She has also been followed in the past at the lymphedema clinic in [place] They come here essentially to have [place] look at her legs because they want to go back to the lymphedema clinic in [place] and apparently they needed [place] to verify that there were no  open areas on her legs. Really not only where there are no open areas today but the legs looked really as good as I have seen this. Her nephew who is her primary caregiver told us that things looked concerning about 3 weeks ago when they made this appointment and they thought they were going to have open areas however apparently a cousin came over and applied lymphedema wrap that they had at home and this is gotten things under control. It is not really clear to me whether she ever wore the juxta lites. 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 There is apparently some concern from her children that her edema in her legs was increasing. Paradoxically they have an appointment tomorrow in  [place] at the lymphedema clinic. They are here for our review of this. One of the daughters is actually doing compression wraps on her legs although I am not sure exactly what she is using. 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 They have also been using triamcinolone cream to her skin, I am not sure of the strength Electronic Signature(s) Signed: 06/14/2020 5:53:04 PM By: Sophia Ham MD Entered By: [name] on 06/14/2020 16:52:05 -------------------------------------------------------------------------------- Physical Exam Details Patient Name: Date of Service: [name] RBA RA C. 06/14/2020 2:45 PM Medical Record Number: [id number] Patient Account Number: [id number] Date of Birth/Sex: Treating RN: Sophia [birth year], Daniel (86 y.o. [name] Primary Care Provider: Other Clinician: Sherrie Daniel Referring Provider: Treating Provider/Extender: [name] in Treatment:  0 Constitutional Patient is hypertensive.. Pulse regular and within target range for patient.[name] Respirations regular, non-labored and within target range.. Temperature is normal and within the target range for the patient.[name] Appears in no distress. Cardiovascular Pedal pulses are palpable. Patient has chronic venous insufficiency changes with secondary lymphedema. Integumentary (Hair, Skin) No open wounds or seen. Skin changes of chronic venous insufficiency with hemosiderin. Notes Wound exam; her lower extremities were carefully inspected there is no open wound here. Pedal pulses are palpable Electronic Signature(s) Signed: 06/14/2020 5:53:04 PM By: Sophia Ham MD Entered By: [name] on 06/14/2020 16:53:27 -------------------------------------------------------------------------------- Physician Orders Details Patient Name: Date of Service: [name] RBA RA C. 06/14/2020 2:45 PM Medical Record Number: [id number] Patient Account Number: [id number] Date of Birth/Sex: Treating RN: [birth year], Daniel (86 y.o. [name] Primary Care Provider: Sherrie Daniel Other Clinician: Referring Provider: Treating Provider/Extender: [name] in Treatment: 0 Verbal / Phone Orders: No Diagnosis Coding Discharge From [facility] Discharge from [place] - Call if you develop any open wound - continue to follow Lymphedema clinic for edema control Skin Barriers/Peri-Wound Care Moisturizing lotion - both legs daily Primary Wound Dressing Other: - ACE wrap to both legs today in clinic Edema Control Elevate legs to the level of the heart or above for 30 minutes daily and/or when sitting, a frequency of: - throughout the day Exercise regularly Electronic Signature(s) Signed: 06/14/2020 5:14:32 PM By: Levan Hurst RN, BSN Signed: 06/14/2020 5:53:04 PM By: Sophia Ham MD Entered By: Levan Hurst on 06/14/2020  16:16:30 -------------------------------------------------------------------------------- Problem List Details Patient Name: Date of Service: [name] RBA RA C. 06/14/2020 2:45 PM Medical Record Number: [id number] Patient Account Number: [id number] Date of Birth/Sex: Treating RN: [birth year] (86 y.o. [name] Primary Care Provider: Sherrie Daniel Other Clinician: Referring Provider: Treating Provider/Extender: [name] in Treatment: 0 Active Problems ICD-10 Encounter Code Description Active Date MDM Diagnosis I87.323 Chronic venous hypertension (idiopathic) with inflammation of bilateral lower 06/14/2020 No Yes extremity I89.0 Lymphedema, not elsewhere classified 06/14/2020 No Yes Inactive Problems Resolved Problems Electronic Signature(s) Signed: 06/14/2020 5:53:04 PM By: Sophia Ham MD Entered By: [name] on 06/14/2020 16:19:23 -------------------------------------------------------------------------------- Progress Note/History and Physical Details Patient Name: Date of Service: [name] RBA RA C. 06/14/2020 2:45 PM Medical Record Number: [id number] Patient Account Number: [id number] Date of Birth/Sex: Treating RN: [birth year] (86 y.o. [name] Primary Care Provider: Sherrie Daniel Other Clinician: Referring Provider: Treating Provider/Extender: [name] in Treatment: 0 Subjective Chief Complaint Information obtained from Patient Bilateral LE Lymphedema with right leg ulcer 8/16; patient returns to clinic out of concern for bilateral lower extremity swelling History of Present Illness (HPI) 11/26/15; the patient arrives today I'm not  exactly sure who referred her. She is here for our review of bilateral edema of her left greater than right legs. She asked me right away if we could wrap them. I note that she has a history of chronic stage III renal failure. She is noted to have chronic  venous insufficiency and lymphedema. I really don't have much more of history here. Dr. [name] who is her cardiologist notes chronic systolic heart failure, her echocardiogram in January 2015 showed an ejection fraction of 35-40%. I do not see any formal venous or arterial studies. She is not a listed diabetic via her problem list in [place] link. As far as discussing the edema in her legs with the patient she really gives a very circumferential history that is hard to follow. I am not sure that she is really had previous wounds in her legs. There is an asymmetry with the left leg being a lot more edematous than the right I am not sure of the history here either. Almost from her initial intake here. She made a series of paranoid sounding statements that made our intake nurse is concerned that she Sophia be at risk. Most of this directed against the son-in-law. 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 She is apparently refused psychiatric evaluation. 12/30/15; the patient arrives here having presumably missed her one-week follow-up appointment today. I'm not sure how things got this far out to. In any case she never received the stockings that we ordered last time. She continues to have a significant lipid lymphedema of her left leg down to her foot. I try to go over the history of this with her, it is clear that there is some chronicity to this problem but I'm not really sure how long she has had this it is at least several years as she states her husband died 2 weeks ago and that point the edema in the left leg was a lot more than now 02/11/16; the patient arrives accompanied by her daughter who apparently has been staying with her but who his primary residence is in [place]. I'm not completely certain why she didn't return here. We had ordered her Juzo stockings and her last visit in early March  patient arrives saying that the company called and they are no longer making these. She is apparently being seen Monday Wednesday and Friday at the lymphedema clinic in [place]. The daughter states that she is being taught to do the lymphedema wraps however makes it [place] aware that she is returning to [place] on Friday. Apparently a nephew will be taking her to the lymphedema clinic, but nobody seems to think that the patient can actually do this. In the meantime she appears to have no open wounds on her bilateral lower legs with careful inspection 02/14/16; this is a patient I discharged on Friday. That she was going to follow up with the lymphedema clinic in [place]. We did order her juxtalite stockings. Sometime over the weekend the left leg has become much larger and somewhat more painful. She was seen today in the lymphedema clinic and they refused to wrap her leg. There are really still no open areas here 02/18/16: This patient I discharged last week however she came to [place] from the lymphedema clinic in [facility] with uncontrolled edema of the left leg. A duplex ultrasound of the leg was negative for DVT that we arrange  and then we rewrapped her leg with a Profore light. We have attempted on at least 2 occasions to get juxtalite stockings in her on however for one reason or another. 02/25/16; patient has no open wounds. She has not yet obtained her juxtalite's although family was present and insisted they've been ordered and paid for READMISSION 07/09/18 This is a now 86 year old woman who comes to clinic accompanied by her nephew. She was last in our clinic in nearly part of 2017. At that time she did not appear to have open wounds on her legs either. There appeared to be a constellation of problems related to obtaining lower extremity stockings, uncontrolled edema etc. She also had a duplex ultrasound that was negative for DVT We discharged her in juxta light stockings which the  patient states she is used for . some period of time but more recently is using some form of lower extremity compression stocking. She apparently developed problems late last month and saw her primary doctor Dr. Gildardo Cranker for bilateral cellulitis of her lower extremity. She has since been followed by home health and they've been putting Unna boots on her. She is supposed to go to see the lymphedema clinic in [place] later in the week and apparently they wanted [place] to look at this to make sure there were no open areas. Also problematic is that you cannot be seen at the lymphedema clinic with home health therefore apparently home health is discharging on Friday i.e. in 3 days' time The patient has a history of lymphedema left greater than right, psychosis NYD, anemia, anxiety, hypertension and venous insufficiency. She has not had any arterial studies READMISSION 10/08/2018 The patient is back in clinic today with again bilateral lower extremity lymphedema. She was here in 2017 for lymphedema and we discharged her with juxta lites. I do not know that she ever really wore these. For the last several months patient has been going to the lymphedema clinic in [place]. They have been wrapping her 3 times a week. Apparently they placed this on Thursday and the patient took it off on Sunday and there were noted to be wounds on her bilateral lower extremities also what looks to be excoriations from scratching although the patient denied it. They went into the clinic on Monday and the occupational therapist there would not replace the wraps because there was open areas. The patient is complaining of itching but no pain. PMhx; Her past medical history is otherwise unchanged she has lymphedema left greater than right, psychosis NYD, anxiety, hypertension and venous insufficiency. ABIs in our clinic were noncompressible bilaterally 10/25/2018; 2-week follow-up. The patient has no open wounds. Her  edema is under good control. She has an appointment early in January with the lymphedema clinic in [place]. Readmission: 08/20/2019 patient is seen today for reevaluation here in our clinic concerning issues she is having with recurrent openings specifically on the right lower extremity she has bilateral lower extremity lymphedema. We did order her juxta light compression last time she was here unfortunately she does not know where those are at this point she says they were misplaced. With that being said there is no signs of active infection she does have some drainage on the right lower extremity. Fortunately there is no evidence of active infection at this time which is good news she has a little bit of pain at this location where she is draining. No fevers, chills, nausea, vomiting, or diarrhea. 09/03/2019 on evaluation today patient appears to be  doing decently well with regard to her lower extremities although she does have a small wound which is new on the left lower extremity and her right lower extremity is still open as well. She is having some pain and she inquired about pain medication. With that being said I explained to the patient and her family member that we do not actually prescribe pain medications here for the wound care center we normally defer to the primary care or potentially pain management as a referral if were unable to gain help for the patient through the primary care provider. With that being said I am more than happy to send a note to them this afternoon just to see if they will be able to help out with her pain management control for short-term. No fevers, chills, nausea, vomiting, or diarrhea. READMISSION 03/04/2020; This is an 86 year old woman we have had multiple times in this clinic previously. She has lymphedema and a history of wounds in her lower extremities. She was here in 2017, 2019 for 2 visits and then again for 2 visits in 2020. We last saw her the wound  was on the left leg. Our listings show that she has bilateral juxta lites. She has also been followed in the past at the lymphedema clinic in [place] They come here essentially to have [place] look at her legs because they want to go back to the lymphedema clinic in [place] and apparently they needed [place] to verify that there were no open areas on her legs. Really not only where there are no open areas today but the legs looked really as good as I have seen this. Her nephew who is her primary caregiver told us that things looked concerning about 3 weeks ago when they made this appointment and they thought they were going to have open areas however apparently a cousin came over and applied lymphedema wrap that they had at home and this is gotten things under control. It is not really clear to me whether she ever wore the juxta lites. 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 There is apparently some concern from her children that her edema in her legs was increasing. Paradoxically they have an appointment tomorrow in [place] at the lymphedema clinic. They are here for our review of this. One of the daughters is actually doing compression wraps on her legs although I am not sure exactly what she is using. 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 They have also been using triamcinolone cream to her skin, I am not sure of the strength Patient History Information obtained from Patient. Allergies aspirin (Severity: Moderate, Reaction: upset stomach), codeine (Severity: Moderate, Reaction: "makes by body go crazy; cramps"), latex (Severity: Moderate, Reaction: nausea and vomiting) Family History Diabetes -  Mother,Paternal Grandparents, No family history of Cancer, Heart Disease, Hereditary Spherocytosis, Hypertension, Kidney Disease, Lung Disease, Seizures, Stroke, Thyroid Problems, Tuberculosis. Social History Never smoker, Marital Status - Widowed, Alcohol Use - Never, Drug Use - No History, Caffeine Use - Daily - coffee. Medical History Eyes Denies history of Cataracts, Glaucoma, Optic Neuritis Ear/Nose/Mouth/Throat Denies history of Chronic sinus problems/congestion, Middle ear problems Hematologic/Lymphatic Patient has history of Anemia, Lymphedema Denies history of Hemophilia, Human Immunodeficiency Virus, Sickle Cell  Disease Respiratory Patient has history of Asthma, Chronic Obstructive Pulmonary Disease (COPD) Denies history of Aspiration, Pneumothorax, Sleep Apnea, Tuberculosis Cardiovascular Patient has history of Congestive Heart Failure - systolic heart failure, Hypertension Denies history of Angina, Arrhythmia, Hypotension, Myocardial Infarction, Peripheral Arterial Disease, Peripheral Venous Disease, Phlebitis, Vasculitis Gastrointestinal Denies history of Cirrhosis , Colitis, Crohnoos, Hepatitis A, Hepatitis B, Hepatitis C Endocrine Patient has history of Type II Diabetes Denies history of Type I Diabetes Genitourinary Denies history of End Stage Renal Disease Immunological Denies history of Lupus Erythematosus, Raynaudoos, Scleroderma Integumentary (Skin) Denies history of History of Burn Musculoskeletal Denies history of Gout, Rheumatoid Arthritis, Osteoarthritis, Osteomyelitis Neurologic Denies history of Dementia, Neuropathy, Quadriplegia, Paraplegia, Seizure Disorder Oncologic Denies history of Received Chemotherapy, Received Radiation Psychiatric Denies history of Anorexia/bulimia, Confinement Anxiety Patient is treated with Controlled Diet. Hospitalization/Surgery History - cholecystectomy. - tonsillectomy. - cervical spine surgery x2. - partial  hysterectomy. - lumbar disc surgery. - esophogastroduodenoscopy. - flexible sigmoidoscopy. Medical A Surgical History Notes nd Cardiovascular unspecified venous insufficiency Gastrointestinal GERD Endocrine Borderline diabetic Musculoskeletal arthritis Psychiatric unspecified nonpsychotic mental disorder Review of Systems (ROS) Constitutional Symptoms (General Health) Denies complaints or symptoms of Fatigue, Fever, Chills, Marked Weight Change. Eyes Denies complaints or symptoms of Dry Eyes, Vision Changes, Glasses / Contacts. Ear/Nose/Mouth/Throat Denies complaints or symptoms of Chronic sinus problems or rhinitis. Respiratory Denies complaints or symptoms of Chronic or frequent coughs, Shortness of Breath. Cardiovascular Denies complaints or symptoms of Chest pain. Gastrointestinal Denies complaints or symptoms of Frequent diarrhea, Nausea, Vomiting. Endocrine Denies complaints or symptoms of Heat/cold intolerance. Genitourinary Denies complaints or symptoms of Frequent urination. Integumentary (Skin) Complains or has symptoms of Wounds. Musculoskeletal Denies complaints or symptoms of Muscle Pain, Muscle Weakness. Neurologic Denies complaints or symptoms of Numbness/parasthesias. Psychiatric Denies complaints or symptoms of Claustrophobia, Suicidal. Objective Constitutional Patient is hypertensive.. Pulse regular and within target range for patient.[name] Respirations regular, non-labored and within target range.. Temperature is normal and within the target range for the patient.[name] Appears in no distress. Vitals Time Taken: 2:55 PM, Height: 62 in, Source: Stated, Weight: 134 lbs, Source: Stated, BMI: 24.5, Temperature: 98 F, Pulse: 76 bpm, Respiratory Rate: 18 breaths/min, Blood Pressure: 190/101 mmHg. Cardiovascular Pedal pulses are palpable. Patient has chronic venous insufficiency changes with secondary lymphedema. General Notes: Wound exam; her lower extremities  were carefully inspected there is no open wound here. Pedal pulses are palpable Integumentary (Hair, Skin) No open wounds or seen. Skin changes of chronic venous insufficiency with hemosiderin. Assessment Active Problems ICD-10 Chronic venous hypertension (idiopathic) with inflammation of bilateral lower extremity Lymphedema, not elsewhere classified Plan Discharge From [facility] Services: Discharge from [place] - Call if you develop any open wound - continue to follow Lymphedema clinic for edema control Skin Barriers/Peri-Wound Care: Moisturizing lotion - both legs daily Primary Wound Dressing: Other: - ACE wrap to both legs today in clinic Edema Control: Elevate legs to the level of the heart or above for 30 minutes daily and/or when sitting, a frequency of: - throughout the day Exercise regularly 1. The patient has bilateral lymphedema secondary to chronic venous insufficiency 2. Her family is treating this with compression wraps they are doing a good job her edema control is really quite as good as I have ever seen in her lower extremities. She has no open wounds 3. She has no open wounds and hence does not need follow-up here. She has an appointment tomorrow with the lymphedema clinic in [place]. I think they will evaluate her  edema control and current treatments/compression 4. I see no issues in her lower extremities that need follow-up here Electronic Signature(s) Signed: 06/14/2020 5:53:04 PM By: Sophia Ham MD Entered By: [name] on 06/14/2020 16:56:17 -------------------------------------------------------------------------------- HxROS Details Patient Name: Date of Service: [name] RBA RA C. 06/14/2020 2:45 PM Medical Record Number: [id number] Patient Account Number: [id number] Date of Birth/Sex: Treating RN: [birth year] (86 y.o. Orvan Falconer Primary Care Provider: Sherrie Daniel Other Clinician: Referring Provider: Treating Provider/Extender:  [name] in Treatment: 0 Label Progress Note Print Version as History and Physical for this encounter Information Obtained From Patient Constitutional Symptoms (General Health) Complaints and Symptoms: Negative for: Fatigue; Fever; Chills; Marked Weight Change Eyes Complaints and Symptoms: Negative for: Dry Eyes; Vision Changes; Glasses / Contacts Medical History: Negative for: Cataracts; Glaucoma; Optic Neuritis Ear/Nose/Mouth/Throat Complaints and Symptoms: Negative for: Chronic sinus problems or rhinitis Medical History: Negative for: Chronic sinus problems/congestion; Middle ear problems Respiratory Complaints and Symptoms: Negative for: Chronic or frequent coughs; Shortness of Breath Medical History: Positive for: Asthma; Chronic Obstructive Pulmonary Disease (COPD) Negative for: Aspiration; Pneumothorax; Sleep Apnea; Tuberculosis Cardiovascular Complaints and Symptoms: Negative for: Chest pain Medical History: Positive for: Congestive Heart Failure - systolic heart failure; Hypertension Negative for: Angina; Arrhythmia; Hypotension; Myocardial Infarction; Peripheral Arterial Disease; Peripheral Venous Disease; Phlebitis; Vasculitis Past Medical History Notes: unspecified venous insufficiency Gastrointestinal Complaints and Symptoms: Negative for: Frequent diarrhea; Nausea; Vomiting Medical History: Negative for: Cirrhosis ; Colitis; Crohns; Hepatitis A; Hepatitis B; Hepatitis C Past Medical History Notes: GERD Endocrine Complaints and Symptoms: Negative for: Heat/cold intolerance Medical History: Positive for: Type II Diabetes Negative for: Type I Diabetes Past Medical History Notes: Borderline diabetic Time with diabetes: 78 Treated with: Diet Genitourinary Complaints and Symptoms: Negative for: Frequent urination Medical History: Negative for: End Stage Renal Disease Integumentary (Skin) Complaints and Symptoms: Positive  for: Wounds Medical History: Negative for: History of Burn Musculoskeletal Complaints and Symptoms: Negative for: Muscle Pain; Muscle Weakness Medical History: Negative for: Gout; Rheumatoid Arthritis; Osteoarthritis; Osteomyelitis Past Medical History Notes: arthritis Neurologic Complaints and Symptoms: Negative for: Numbness/parasthesias Medical History: Negative for: Dementia; Neuropathy; Quadriplegia; Paraplegia; Seizure Disorder Psychiatric Complaints and Symptoms: Negative for: Claustrophobia; Suicidal Medical History: Negative for: Anorexia/bulimia; Confinement Anxiety Past Medical History Notes: unspecified nonpsychotic mental disorder Hematologic/Lymphatic Medical History: Positive for: Anemia; Lymphedema Negative for: Hemophilia; Human Immunodeficiency Virus; Sickle Cell Disease Immunological Medical History: Negative for: Lupus Erythematosus; Raynauds; Scleroderma Oncologic Medical History: Negative for: Received Chemotherapy; Received Radiation Immunizations Pneumococcal Vaccine: Received Pneumococcal Vaccination: No Immunization Notes: Pt. unsure of last tetanus shot. Implantable Devices None Hospitalization / Surgery History Type of Hospitalization/Surgery cholecystectomy tonsillectomy cervical spine surgery x2 partial hysterectomy lumbar disc surgery esophogastroduodenoscopy flexible sigmoidoscopy Family and Social History Cancer: No; Diabetes: Yes - Mother,Paternal Grandparents; Heart Disease: No; Hereditary Spherocytosis: No; Hypertension: No; Kidney Disease: No; Lung Disease: No; Seizures: No; Stroke: No; Thyroid Problems: No; Tuberculosis: No; Never smoker; Marital Status - Widowed; Alcohol Use: Never; Drug Use: No History; Caffeine Use: Daily - coffee; Financial Concerns: No; Food, Clothing or Shelter Needs: No; Support System Lacking: No; Transportation Concerns: No Electronic Signature(s) Signed: 06/14/2020 5:53:04 PM By: Sophia Ham  MD Signed: 06/25/2020 5:50:16 PM By: Carlene Coria RN Entered By: Carlene Coria on 06/14/2020 15:46:17 -------------------------------------------------------------------------------- SuperBill Details Patient Name: Date of Service: [name] RBA RA C. 06/14/2020 Medical Record Number: [id number] Patient Account Number: [id number] Date of Birth/Sex: Treating RN: [birth year] (86 y.o. [name] Primary Care Provider: Sherrie Daniel Other Clinician:  Referring Provider: Treating Provider/Extender: [name] in Treatment: 0 Diagnosis Coding ICD-10 Codes Code Description [id number] Chronic venous hypertension (idiopathic) with inflammation of bilateral lower extremity I89.0 Lymphedema, not elsewhere classified Facility Procedures CPT4 Code: [id number] Description: 14830 - WOUND CARE VISIT-LEV 3 EST PT Modifier: Quantity: 1 Physician Procedures : CPT4 Code Description Modifier [id number] - WC PHYS LEVEL 3 - EST PT ICD-10 Diagnosis Description I87.323 Chronic venous hypertension (idiopathic) with inflammation of bilateral lower extremity I89.0 Lymphedema, not elsewhere classified Quantity: 1 Electronic Signature(s) Signed: 06/14/2020 5:14:32 PM By: Levan Hurst RN, BSN Signed: 06/14/2020 5:53:04 PM By: Sophia Ham MD Entered By: Levan Hurst on 06/14/2020 17:08:41

## 2020-07-01 ENCOUNTER — Encounter: Payer: Self-pay | Admitting: Occupational Therapy

## 2020-07-01 ENCOUNTER — Other Ambulatory Visit: Payer: Self-pay

## 2020-07-01 ENCOUNTER — Ambulatory Visit: Payer: Medicare Other | Attending: Family | Admitting: Occupational Therapy

## 2020-07-01 DIAGNOSIS — I890 Lymphedema, not elsewhere classified: Secondary | ICD-10-CM | POA: Insufficient documentation

## 2020-07-01 NOTE — Patient Instructions (Signed)

## 2020-07-01 NOTE — Therapy (Signed)
[place] MAIN [facility] SERVICES [address] [place], [place], [address] Phone: [phone]   Fax:  [phone]  Occupational Therapy Evaluation  Patient Details  Name: [name] MRN: [id number] Date of Birth: 1931/11/14 Referring Provider (OT): Sandrea Hughs, NP   Encounter Date: 07/01/2020   OT End of Session - 07/01/20 1607    Visit Number 1    Number of Visits 36    Date for OT Re-Evaluation 09/29/20    OT Start Time 0257    OT Stop Time 0348    OT Time Calculation (min) 51 min    Activity Tolerance Patient tolerated treatment well;No increased pain    Behavior During Therapy WFL for tasks assessed/performed           Past Medical History:  Diagnosis Date  . Anemia, unspecified   . Anxiety   . Arthritis   . Asthma   . Delusions ([place])   . Depression   . Edema 2005   "since put the glass in them"   . GERD (gastroesophageal reflux disease)   . Lumbago   . Lymphedema   . Obstructive chronic bronchitis without exacerbation ([place])   . Other abnormal glucose   . 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 Systolic heart failure ([facility])    EF 35 to 40% per echo January 2015  . Unspecified essential hypertension   . Unspecified nonpsychotic mental disorder   . Unspecified venous (peripheral) insufficiency   . Unspecified venous (peripheral) insufficiency   . Unspecified vitamin D deficiency     Past Surgical History:  Procedure Laterality Date  . APPENDECTOMY    . CERVICAL SPINE SURGERY     x 2  . CHOLECYSTECTOMY    . ESOPHAGOGASTRODUODENOSCOPY N/A 04/05/2015   Procedure: ESOPHAGOGASTRODUODENOSCOPY (EGD);  Surgeon: [name], MD;  Location: [facility];  Service: Endoscopy;  Laterality: N/A;  . FLEXIBLE SIGMOIDOSCOPY N/A 04/05/2015   Procedure: FLEXIBLE SIGMOIDOSCOPY;  Surgeon: [name], MD;  Location: [facility];  Service: Endoscopy;  Laterality: N/A;  . HAND SURGERY    . LUMBAR DISC SURGERY    . PARTIAL  HYSTERECTOMY    . TONSILLECTOMY      There were no vitals filed for this visit.   Subjective Assessment - 07/01/20 1501    Subjective  [name] is referred to Occupational Therapy by Sandrea Hughs, NP, for evaluation and treatment of BLE lymphedema (LE). [name] is accompanied today by her niece, Peter Congo, who is also her full-time care giver, and her nephew [name]. She is well known to this therapist as we have worked together twice before for Intensive Phase Complete Decongestive Therapy (CDT). Pt had limited success with swelling reduction and tissue improvement due to poor attendance and limited caregiver assistance between visits with compression wrapping and skin care. Last CDT course was interrupted by development of leg wounds. Pt was referred to wound clinic and did not resume OT.    Patient is accompanied by: Family member    Pertinent History Chronic obstructive bronchitis, CVI, anxiety , systoloc heart failure, asthma, arthritis, multiple spinal surgeries w/ chronic back pain, DM,    Limitations Difficulty walking, impaired standing tolerance, impaired transfers, decreased joint AROM at toes, ankles and knees, chronic leg swelling , chronic pain. increased infection and falls risks, cognitive impairment, generaized weakness    Repetition Increases Symptoms    Special Tests + Stemmer bilaterally    Patient Stated Goals reduce swelling and  legs feel better    Pain Score --   not rated numerically   Pain Location Leg    Pain Orientation Right;Left    Pain Descriptors / Indicators Aching;Tender;Pressure;Heaviness;Tightness;Tiring    Pain Type Chronic pain    Pain Onset Other (comment)   >20 years            [facility] OT Assessment - 07/01/20 0001      Assessment   Medical Diagnosis Severe, stage III BLE lymphedema    Referring Provider (OT) Nelda Bucks Ngetich, NP    Prior Therapy OT for Intensive Phase CDT x 2; fit with adjustable velcro wrap-style , knee length compression  for daytime use. Poor compliance . Limited CG assistance      Precautions   Precautions Fall    Precaution Comments LE PRECAUTIONS: DM skin, ASTHMA, OBSTRUCTIVE BRONCHITIS, HX CELLULITIS, HX LEG WOUNDS      Home  Environment   Family/patient expects to be discharged to: Private residence    Living Arrangements Other relatives    Available Help at Discharge Family    Type of Home House    Additional Comments Niece, Peter Congo, now lives w/ [name] and cares for her full-time    Lives With Family      Prior Function   Level of Independence Needs assistance with ADLs;Needs assistance with homemaking;Needs assistance with gait;Needs assistance with transfers    Vocation Retired      IADL   Prior Level of Function Shopping Max A    Shopping Completely unable to shop    Prior Level of Function Light Housekeeping Max A    Light Housekeeping Needs help with all home maintenance tasks    Prior Level of Function Meal Prep Max A    Meal Prep Needs to have meals prepared and served    Prior Level of Function Community Mobility Max A    Community Mobility Relies on family or friends for transportation      Mobility   Mobility Status History of falls;Needs assist      Cognition   Overall Cognitive Status History of cognitive impairments - at baseline      Posture/Postural Control   Posture/Postural Control No significant limitations                    OT Treatments/Exercises (OP) - 07/01/20 0001      Transfers   Transfers Sit to Stand    Sit to Stand 4: Min guard;4: Min assist      ADLs   Overall ADLs needs CG assistance with all basic ADLs    Eating unable to reach feet to groom nails and perform skin care    LB Dressing difficulty fitting street shoes and LB clothing    Bathing unable to reach feet to bathe LB     Cooking dependent    Home Maintenance dependent    Leisure leisure activities limited by poor standing  and walking tolerance.    ADL Education Given Yes           Moderate stage III BLE lymphedema 2/2 CVI  Skin  Description Hyper-Keratosis Peau' de Orange Shiny Tight Fibrotic/ Indurated Fatty Doughy spongy   x   x  x         Skin dry Flaky Erythema Macerated   x x     Color Redness Present Pallor Blanching Hemosiderin Staining Other      x     Odor Malodorous Yeast  Fungal infection  Absent      x   Temperature Warm Cool wnl    x      Pitting Edema   1+ 2+ 3+ 4+ Non-pitting        x   Girth Symmetrical Asymmetrical                   Distribution    L>R  Toes to popliteal fossa bilaterally   Stemmer Sign Positive Negative    Strong    Lymphorrhea History Of:  Present Absent   x      Wounds History Of Present Absent Venous Arterial Pressure Size    x            Signs of Infection Redness Warmth Erythema Acute Swelling Drainage Borders                    Sensation Light Touch Deep pressure Hypersensitivty   Present Impaired Present Impaired Absent Impaired        x    Nails WNL Fungus Other    x   x Hair Growth Symmetrical Asymmetrical       Skin Creases Base of toes  Ankles   Base of Fingers Medial Thighs         Abdominal pannus Medial legs     x x      feet          OT Education - 07/01/20 1601    Education Details Provided Pt and family education regarding lymphatic structure and function, etiologies, onset patterns and stages of progression. Discussed  impact of obesity on lymphatic function. Outlined Complete Decongestive Therapy (CDT)  as standard of care and provided in depth information regarding 4 primary components of both Intensive and Self Management Phases, including Manual Lymph Drainage (MLD), compression wrapping and garments, skin care, and therapeutic exercise.   Pilar Plate discussion of high burden of care,  Discussed  Importance of daily, ongoing LE self-care essential to retaining clinical gains and limiting progression.  Lastly, reviewed lymphedema precautions, including  cellulitis risk and difficulty with wound healing. Provided printed Lymphedema Workbook for reference. Pt verbalized understanding that assistance with home program is essential to meet OT rehab goals for LE care.  DUE to cognitive impairment and INABILITY TO REACH FEET AND DISTAL LEGS CONSISTENT, DAILY CAREGIVER ASSISTANCE  with all LE home program components is critical for progress towards goals and ongoing LE management. WITHOUT daily ASSISTANCE WITH WRAPS, skin care, MLD and compression prognosis for improvement in condition is poor and increased infection risk and disability are imminent.    Person(s) Educated Patient;Caregiver(s);Other (comment)    Methods Explanation;Demonstration;Handout    Comprehension Verbalized understanding;Returned demonstration;Need further instruction;Verbal cues required               OT Long Term Goals - 07/01/20 1618      OT LONG TERM GOAL #1   Title With maximum caregiver assistance Pt will be able to apply knee length, multi-layer, short stretch compression wraps daily from ankle to tibial tuberosity on one leg at a time using correct gradient techniques to return affected limb/s, as closely as possible, to premorbid size and shape, to limit leg pain and infection risk, and to improve safe functional mobility and ADLs performance.    Baseline dependent    Time 4    Period Days    Status New    Target Date --   6th OT Rx visit  OT LONG TERM GOAL #2   Title With Maximum caregiver assistance Pt will be able to verbalize signs and symptoms of cellulitis infection and identify lymphedema precautions using printed resource (modified independence) for reference to decrease infection risk and limit LE progression over time.    Baseline dependent    Time 6    Period Days    Status New    Target Date --   6th OT Rx visit     OT LONG TERM GOAL #3   Title With Maximum caregiver assistance Pt >85 % compliant with all daily, LE self-care protocols for home  program, including simple self-manual lymphatic drainage (MLD), skin care, lymphatic pumping ther ex, skin care, and compression wraps and garments  to limit LE progression and further functional decline.    Baseline dependent    Time 6    Period Weeks    Status New    Target Date --   6th OT Rx visit     OT LONG TERM GOAL #4   Baseline dependent    Time 6    Period Weeks    Status Partially Met      OT LONG TERM GOAL #5   Title Pt to tolerate compression wraps/ garments in keeping w/ prescribed daily wear regime within 1 week of issue date to progress and retain clinical and functional gains and to limit LE progression.    Baseline dependent    Time 6    Period Weeks    Status New    Target Date --   6th OT Rx visit     Long Term Additional Goals   Additional Long Term Goals Yes      OT LONG TERM GOAL #6   Title Pt to achieve at least 10% LLE limb volume reduction bilaterally during modified Intensive Phase CDT to reduce infection risk, risk of recurrent leg wounds, to improve functional performance of basic and instrumental ADLs, and to limit LE progression.    Baseline dependent    Time 6    Period Weeks    Status New    Target Date --   6th OT Rx visit                Plan - 07/01/20 1610    Clinical Impression Statement OKLA QAZI is an 48 y old female presenting with moderate, stage III lymphedema secondary to CVI. BLE swelling and associated pain limits functional ambulation and transfers, functional performance of most basic ADLs, leisure pursuits and social participation. Ms. Aquilla Hacker will benefit from skilled Occupational Therapy to reduce limb swelling and associated pain and to limit LE progression. Emphasis of OT will be on caregiver training for all LE home management components, including manual lymphatic draiage (MLD), skin care, gradient compression wraps and therapeutic exercise. Pt will be fit with alternative, Velcro wrap style compression garments for  daytime. Without OT for CDT, LE will progress and further functional decline is expected. Without daily, consistent caregiver assistance with LE management prognosis is poor.    Occupational Profile and client history currently impacting functional performance dimensia, inconsistent CG assistance with removing wraps, providing skin care and reapplying compression between clinical visits. S    Occupational performance deficits (Please refer to evaluation for details): ADL's;IADL's;Rest and Sleep;Play;Leisure;Social Participation;Other   body image   Rehab Potential Fair    Clinical Decision Making Multiple treatment options, significant modification of task necessary    Comorbidities Affecting Occupational Performance: Presence of comorbidities impacting  occupational performance    Modification or Assistance to Complete Evaluation  Max significant modification of tasks or assist is necessary to complete    OT Frequency 1x / week    OT Duration 6 weeks   and PRN   OT Treatment/Interventions Self-care/ADL training;DME and/or AE instruction;Manual lymph drainage;Patient/family education;Compression bandaging;Therapeutic exercise;Manual Therapy;Other (comment)    Plan skin care to decrease infection risk and improve hydration    Recommended Other Services fit with adjustable , alternative compression wraps for optimal independence w/ donning and doffing. Cnsider CircAid with footpieces.    Consulted and Agree with Plan of Care Patient;Family member/caregiver           Patient will benefit from skilled therapeutic intervention in order to improve the following deficits and impairments:           Visit Diagnosis: Lymphedema, not elsewhere classified - Plan: Ot plan of care cert/re-cert    Problem List Patient Active Problem List   Diagnosis Date Noted  . Medication noncompliance due to cognitive impairment 10/17/2019  . Chronic pain of multiple joints 08/01/2017  . Age-related osteoporosis  without current pathological fracture 08/01/2017  . Left knee pain 09/19/2016  . Headache 06/22/2015  . Rectal benign neoplasm   . Asthma, chronic 04/04/2015  . history of Tubular adenoma of colon 04/04/2015  . 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 Hypokalemia 04/04/2015  . CKD (chronic kidney disease) stage 3, GFR 30-59 ml/min 04/03/2015  . Dehydration 04/03/2015  . History of colonic polyps 12/08/2014  . Edema 04/14/2014  . Chronic systolic heart failure ([place]) 11/20/2013  . Nausea with vomiting 11/13/2013  . Lymphedema of lower extremity 10/17/2013  . 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 MGUS (monoclonal gammopathy of unknown significance) 05/15/2012  . Vitamin D deficiency 01/29/2010  . BURSITIS, LEFT ELBOW 01/29/2010  . DERMATITIS 09/30/2009  . DIABETES MELLITUS, BORDERLINE 01/11/2009  . Paranoid delusion ([place]) 08/02/2008  . Obstructive chronic bronchitis ([place]) 08/02/2008  . Essential hypertension 11/11/2007  . Venous (peripheral) insufficiency 11/11/2007  . RENAL CALCULUS 11/11/2007  . LOW BACK PAIN, CHRONIC 11/11/2007    Andrey Spearman, MS, OTR/L, [facility] 07/01/20 4:37 PM  [place] MAIN [facility] SERVICES [address] [place], [place], [address] Phone: [phone]   Fax:  [phone]  Name: [name] MRN: [id number] Date of Birth: 09-01-1932

## 2020-07-01 NOTE — Therapy (Deleted)
[place] MAIN [facility] SERVICES [address] [place], [place], [address] Phone: [phone]   Fax:  [phone]  Occupational Therapy Evaluation : BLE Lymphedema  Patient Details  Name: [name] MRN: [id number] Date of Birth: [birth year] Referring Provider (OT): Sandrea Hughs, NP   Encounter Date: 07/01/2020   OT End of Session - 07/01/20 1607    Visit Number 1    Number of Visits 36    Date for OT Re-Evaluation 09/29/20    OT Start Time 0257    OT Stop Time 0348    OT Time Calculation (min) 51 min    Activity Tolerance Patient tolerated treatment well;No increased pain    Behavior During Therapy WFL for tasks assessed/performed           Past Medical History:  Diagnosis Date  . Anemia, unspecified   . Anxiety   . Arthritis   . Asthma   . Delusions ([place])   . Depression   . Edema 2005   "since put the glass in them"   . GERD (gastroesophageal reflux disease)   . Lumbago   . Lymphedema   . Obstructive chronic bronchitis without exacerbation ([place])   . Other abnormal glucose   . 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 Systolic heart failure (H[facility])    EF 35 to 40% per echo January 2015  . Unspecified essential hypertension   . Unspecified nonpsychotic mental disorder   . Unspecified venous (peripheral) insufficiency   . Unspecified venous (peripheral) insufficiency   . Unspecified vitamin D deficiency     Past Surgical History:  Procedure Laterality Date  . APPENDECTOMY    . CERVICAL SPINE SURGERY     x 2  . CHOLECYSTECTOMY    . ESOPHAGOGASTRODUODENOSCOPY N/A 04/05/2015   Procedure: ESOPHAGOGASTRODUODENOSCOPY (EGD);  Surgeon: [name], MD;  Location: [facility];  Service: Endoscopy;  Laterality: N/A;  . FLEXIBLE SIGMOIDOSCOPY N/A 04/05/2015   Procedure: FLEXIBLE SIGMOIDOSCOPY;  Surgeon: [name], MD;  Location: [facility];  Service: Endoscopy;  Laterality: N/A;  . HAND SURGERY    . LUMBAR DISC SURGERY      . PARTIAL HYSTERECTOMY    . TONSILLECTOMY      There were no vitals filed for this visit.   Subjective Assessment - 07/01/20 1501    Subjective  [name] is referred to Occupational Therapy by Sandrea Hughs, NP, for evaluation and treatment of BLE lymphedema (LE). [name] is accompanied today by her niece, Peter Congo, who is also her full-time care giver, and her nephew [name]. She is well known to this therapist as we have worked together twice before for Intensive Phase Complete Decongestive Therapy (CDT). Pt had limited success with swelling reduction and tissue improvement due to poor attendance and limited caregiver assistance between visits with compression wrapping and skin care. Last CDT course was interrupted by development of leg wounds. Pt was referred to wound clinic and did not resume OT.    Patient is accompanied by: Family member    Pertinent History Chronic obstructive bronchitis, CVI, anxiety , systoloc heart failure, asthma, arthritis, multiple spinal surgeries w/ chronic back pain, DM,    Limitations Difficulty walking, impaired standing tolerance, impaired transfers, decreased joint AROM at toes, ankles and knees, chronic leg swelling , chronic pain. increased infection and falls risks, cognitive impairment, generaized weakness    Repetition Increases Symptoms    Special Tests + Stemmer bilaterally    Patient Stated  Goals reduce swelling and legs feel better    Pain Score --   not rated numerically   Pain Location Leg    Pain Orientation Right;Left    Pain Descriptors / Indicators Aching;Tender;Pressure;Heaviness;Tightness;Tiring    Pain Type Chronic pain    Pain Onset Other (comment)   >20 years            [facility] OT Assessment - 07/01/20 0001      Assessment   Medical Diagnosis Severe, stage III BLE lymphedema    Referring Provider (OT) Nelda Bucks Ngetich, NP    Prior Therapy OT for Intensive Phase CDT x 2; fit with adjustable velcro wrap-style , knee length  compression for daytime use. Poor compliance . Limited CG assistance      Precautions   Precautions Fall    Precaution Comments LE PRECAUTIONS: DM skin, ASTHMA, OBSTRUCTIVE BRONCHITIS, HX CELLULITIS, HX LEG WOUNDS      Home  Environment   Family/patient expects to be discharged to: Private residence    Living Arrangements Other relatives    Available Help at Discharge Family    Type of Home House    Additional Comments Niece, Peter Congo, now lives w/ [name] and cares for her full-time    Lives With Family      Prior Function   Level of Independence Needs assistance with ADLs;Needs assistance with homemaking;Needs assistance with gait;Needs assistance with transfers    Vocation Retired      IADL   Prior Level of Function Shopping Max A    Shopping Completely unable to shop    Prior Level of Function Light Housekeeping Max A    Light Housekeeping Needs help with all home maintenance tasks    Prior Level of Function Meal Prep Max A    Meal Prep Needs to have meals prepared and served    Prior Level of Function Community Mobility Max A    Community Mobility Relies on family or friends for transportation      Mobility   Mobility Status History of falls;Needs assist      Cognition   Overall Cognitive Status History of cognitive impairments - at baseline      Posture/Postural Control   Posture/Postural Control No significant limitations            Moderate stage III BLE lymphedema 2/2 CVI  Skin  Description Hyper-Keratosis Peau' de Orange Shiny Tight Fibrotic/ Indurated Fatty Doughy spongy   x   x  x         Skin dry Flaky Erythema Macerated   x x     Color Redness Present Pallor Blanching Hemosiderin Staining Other      x     Odor Malodorous Yeast Fungal infection  Absent      x   Temperature Warm Cool wnl    x      Pitting Edema   1+ 2+ 3+ 4+ Non-pitting        x   Girth Symmetrical Asymmetrical                   Distribution    L>R  Toes to  popliteal fossa bilaterally   Stemmer Sign Positive Negative    Strong    Lymphorrhea History Of:  Present Absent   x      Wounds History Of Present Absent Venous Arterial Pressure Size    x            Signs of Infection  Redness Warmth Erythema Acute Swelling Drainage Borders                    Sensation Light Touch Deep pressure Hypersensitivty   Present Impaired Present Impaired Absent Impaired        x    Nails WNL Fungus Other    x   x Hair Growth Symmetrical Asymmetrical       Skin Creases Base of toes  Ankles   Base of Fingers Medial Thighs         Abdominal pannus Medial legs     x x      feet           OT Treatments/Exercises (OP) - 07/01/20 0001      Transfers   Transfers Sit to Stand    Sit to Stand 4: Min guard;4: Min assist      ADLs   ADL Education Given Yes                 OT Education - 07/01/20 1601    Education Details Provided Pt and family education regarding lymphatic structure and function, etiologies, onset patterns and stages of progression. Discussed  impact of obesity on lymphatic function. Outlined Complete Decongestive Therapy (CDT)  as standard of care and provided in depth information regarding 4 primary components of both Intensive and Self Management Phases, including Manual Lymph Drainage (MLD), compression wrapping and garments, skin care, and therapeutic exercise.   Pilar Plate discussion of high burden of care,  Discussed  Importance of daily, ongoing LE self-care essential to retaining clinical gains and limiting progression.  Lastly, reviewed lymphedema precautions, including cellulitis risk and difficulty with wound healing. Provided printed Lymphedema Workbook for reference. Pt verbalized understanding that assistance with home program is essential to meet OT rehab goals for LE care.  DUE to cognitive impairment and INABILITY TO REACH FEET AND DISTAL LEGS CONSISTENT, DAILY CAREGIVER ASSISTANCE  with all LE home  program components is critical for progress towards goals and ongoing LE management. WITHOUT daily ASSISTANCE WITH WRAPS, skin care, MLD and compression prognosis for improvement in condition is poor and increased infection risk and disability are imminent.    Person(s) Educated Patient;Caregiver(s);Other (comment)    Methods Explanation;Demonstration;Handout    Comprehension Verbalized understanding;Returned demonstration;Need further instruction;Verbal cues required               OT Long Term Goals - 07/01/20 1618      OT LONG TERM GOAL #1   Title With maximum caregiver assistance Pt will be able to apply knee length, multi-layer, short stretch compression wraps daily from ankle to tibial tuberosity on one leg at a time using correct gradient techniques to return affected limb/s, as closely as possible, to premorbid size and shape, to limit leg pain and infection risk, and to improve safe functional mobility and ADLs performance.    Baseline dependent    Time 4    Period Days    Status New    Target Date --   6th OT Rx visit     OT LONG TERM GOAL #2   Title With Maximum caregiver assistance Pt will be able to verbalize signs and symptoms of cellulitis infection and identify lymphedema precautions using printed resource (modified independence) for reference to decrease infection risk and limit LE progression over time.    Baseline dependent    Time 6    Period Days    Status New    Target Date --  6th OT Rx visit     OT LONG TERM GOAL #3   Title With Maximum caregiver assistance Pt >85 % compliant with all daily, LE self-care protocols for home program, including simple self-manual lymphatic drainage (MLD), skin care, lymphatic pumping ther ex, skin care, and compression wraps and garments  to limit LE progression and further functional decline.    Baseline dependent    Time 6    Period Weeks    Status New    Target Date --   6th OT Rx visit     OT LONG TERM GOAL #4    Baseline dependent    Time 6    Period Weeks    Status Partially Met      OT LONG TERM GOAL #5   Title Pt to tolerate compression wraps/ garments in keeping w/ prescribed daily wear regime within 1 week of issue date to progress and retain clinical and functional gains and to limit LE progression.    Baseline dependent    Time 6    Period Weeks    Status New    Target Date --   6th OT Rx visit     Long Term Additional Goals   Additional Long Term Goals Yes      OT LONG TERM GOAL #6   Title Pt to achieve at least 10% LLE limb volume reduction bilaterally during modified Intensive Phase CDT to reduce infection risk, risk of recurrent leg wounds, to improve functional performance of basic and instrumental ADLs, and to limit LE progression.    Baseline dependent    Time 6    Period Weeks    Status New    Target Date --   6th OT Rx visit                Plan - 07/01/20 1610    Clinical Impression Statement JOHNEISHA BROADEN is an 53 y old female presenting with moderate, stage III lymphedema secondary to CVI. BLE swelling and associated pain limits functional ambulation and transfers, functional performance of most basic ADLs, leisure pursuits and social participation. Ms. Aquilla Hacker will benefit from skilled Occupational Therapy to reduce limb swelling and associated pain and to limit LE progression. Emphasis of OT will be on caregiver training for all LE home management components, including manual lymphatic draiage (MLD), skin care, gradient compression wraps and therapeutic exercise. Pt will be fit with alternative, Velcro wrap style compression garments for daytime. Without OT for CDT, LE will progress and further functional decline is expected. Without daily, consistent caregiver assistance with LE management prognosis is poor.    Occupational Profile and client history currently impacting functional performance dimensia, inconsistent CG assistance with removing wraps, providing skin  care and reapplying compression between clinical visits. S    Occupational performance deficits (Please refer to evaluation for details): ADL's;IADL's;Rest and Sleep;Play;Leisure;Social Participation;Other   body image   Rehab Potential Fair    Clinical Decision Making Multiple treatment options, significant modification of task necessary    Comorbidities Affecting Occupational Performance: Presence of comorbidities impacting occupational performance    Modification or Assistance to Complete Evaluation  Max significant modification of tasks or assist is necessary to complete    OT Frequency 1x / week    OT Duration 6 weeks   and PRN   OT Treatment/Interventions Self-care/ADL training;DME and/or AE instruction;Manual lymph drainage;Patient/family education;Compression bandaging;Therapeutic exercise;Manual Therapy;Other (comment)    Plan skin care to decrease infection risk and improve hydration  Recommended Other Services fit with adjustable , alternative compression wraps for optimal independence w/ donning and doffing. Cnsider CircAid with footpieces.    Consulted and Agree with Plan of Care Patient;Family member/caregiver           Patient will benefit from skilled therapeutic intervention in order to improve the following deficits and impairments:           Visit Diagnosis: Lymphedema, not elsewhere classified - Plan: Ot plan of care cert/re-cert    Problem List Patient Active Problem List   Diagnosis Date Noted  . Medication noncompliance due to cognitive impairment 10/17/2019  . Chronic pain of multiple joints 08/01/2017  . Age-related osteoporosis without current pathological fracture 08/01/2017  . Left knee pain 09/19/2016  . Headache 06/22/2015  . Rectal benign neoplasm   . Asthma, chronic 04/04/2015  . history of Tubular adenoma of colon 04/04/2015  . 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 Hypokalemia 04/04/2015  . CKD (chronic kidney disease) stage 3, GFR  30-59 ml/min 04/03/2015  . Dehydration 04/03/2015  . History of colonic polyps 12/08/2014  . Edema 04/14/2014  . Chronic systolic heart failure ([place]) 11/20/2013  . Nausea with vomiting 11/13/2013  . Lymphedema of lower extremity 10/17/2013  . 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 MGUS (monoclonal gammopathy of unknown significance) 05/15/2012  . Vitamin D deficiency 01/29/2010  . BURSITIS, LEFT ELBOW 01/29/2010  . DERMATITIS 09/30/2009  . DIABETES MELLITUS, BORDERLINE 01/11/2009  . Paranoid delusion ([place]) 08/02/2008  . Obstructive chronic bronchitis ([place]) 08/02/2008  . Essential hypertension 11/11/2007  . Venous (peripheral) insufficiency 11/11/2007  . RENAL CALCULUS 11/11/2007  . LOW BACK PAIN, CHRONIC 11/11/2007    Ansel Bong 07/01/2020, 4:32 PM  [place] MAIN [facility] SERVICES [address] [place], [place], [address] Phone: [phone]   Fax:  [phone]  Name: [name] MRN: [id number] Date of Birth: Oct 24, 1932

## 2020-07-06 ENCOUNTER — Other Ambulatory Visit: Payer: Self-pay

## 2020-07-06 ENCOUNTER — Ambulatory Visit: Payer: Medicare Other | Admitting: Occupational Therapy

## 2020-07-06 DIAGNOSIS — I890 Lymphedema, not elsewhere classified: Secondary | ICD-10-CM | POA: Diagnosis not present

## 2020-07-06 NOTE — Therapy (Signed)
[place] MAIN [facility] SERVICES [address] [place], [place], [address] Phone: [phone]   Fax:  [phone]  Occupational Therapy Treatment  Patient Details  Name: [name] MRN: [id number] Date of Birth: 04/06/1932 Referring Provider (OT): Sandrea Hughs, NP   Encounter Date: 07/06/2020   OT End of Session - 07/06/20 1636    Visit Number 2    Number of Visits 36    Date for OT Re-Evaluation 09/29/20    OT Start Time 0207    OT Stop Time 0300    OT Time Calculation (min) 53 min    Activity Tolerance Patient tolerated treatment well;No increased pain    Behavior During Therapy WFL for tasks assessed/performed           Past Medical History:  Diagnosis Date  . Anemia, unspecified   . Anxiety   . Arthritis   . Asthma   . Delusions ([place])   . Depression   . Edema 2005   "since put the glass in them"   . GERD (gastroesophageal reflux disease)   . Lumbago   . Lymphedema   . Obstructive chronic bronchitis without exacerbation ([place])   . Other abnormal glucose   . 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[place]) 04/04/2013  . Systolic heart failure (HCC)    EF 35 to 40% per echo January 2015  . Unspecified essential hypertension   . Unspecified nonpsychotic mental disorder   . Unspecified venous (peripheral) insufficiency   . Unspecified venous (peripheral) insufficiency   . Unspecified vitamin D deficiency     Past Surgical History:  Procedure Laterality Date  . APPENDECTOMY    . CERVICAL SPINE SURGERY     x 2  . CHOLECYSTECTOMY    . ESOPHAGOGASTRODUODENOSCOPY N/A 04/05/2015   Procedure: ESOPHAGOGASTRODUODENOSCOPY (EGD);  Surgeon: [name], MD;  Location: [facility];  Service: Endoscopy;  Laterality: N/A;  . FLEXIBLE SIGMOIDOSCOPY N/A 04/05/2015   Procedure: FLEXIBLE SIGMOIDOSCOPY;  Surgeon: [name], MD;  Location: [facility];  Service: Endoscopy;  Laterality: N/A;  . HAND SURGERY    . LUMBAR DISC SURGERY    . PARTIAL  HYSTERECTOMY    . TONSILLECTOMY      There were no vitals filed for this visit.   Subjective Assessment - 07/06/20 1630    Subjective  [name] presents for OT Rx visit 2/36 to address BLE lymphedma. Pt is accompanied by her nephew and niece/caregiver today. Pt presents with single short stretch compression wrap over cotton stockinett, (without Rosidal foam) bialterally. Niece reports Pt was uncomfortable in foam and requested she take it off.    Patient is accompanied by: Family member    Pertinent History Chronic obstructive bronchitis, CVI, anxiety , systoloc heart failure, asthma, arthritis, multiple spinal surgeries w/ chronic back pain, DM,    Limitations Difficulty walking, impaired standing tolerance, impaired transfers, decreased joint AROM at toes, ankles and knees, chronic leg swelling , chronic pain. increased infection and falls risks, cognitive impairment, generaized weakness    Repetition Increases Symptoms    Special Tests + Stemmer bilaterally    Patient Stated Goals reduce swelling and legs feel better    Pain Onset Other (comment)   >20 years                       OT Treatments/Exercises (OP) - 07/06/20 0001      ADLs   ADL Education Given Yes  Manual Therapy   Manual Therapy Edema management;Compression Bandaging    Manual therapy comments emphasis of visit on CG edu    Edema Management initial LLE limb volumetrics.     Compression Bandaging LLE compression wrap using 2 short stretch wraps, 8 and 10 cm wide, over single layer Rosidal foal (0.4 cm) from base of toes to tibial tuberosity.                   OT Education - 07/06/20 1635    Education provided Yes    Education Details Pt and CG edu for gradient compression wrapping techniques    Person(s) Educated Patient;Caregiver(s)    Methods Explanation;Demonstration;Handout;Tactile cues;Verbal cues    Comprehension Verbalized understanding;Returned demonstration;Need further  instruction;Verbal cues required;Tactile cues required               OT Long Term Goals - 07/01/20 1618      OT LONG TERM GOAL #1   Title With maximum caregiver assistance Pt will be able to apply knee length, multi-layer, short stretch compression wraps daily from ankle to tibial tuberosity on one leg at a time using correct gradient techniques to return affected limb/s, as closely as possible, to premorbid size and shape, to limit leg pain and infection risk, and to improve safe functional mobility and ADLs performance.    Baseline dependent    Time 4    Period Days    Status New    Target Date --   6th OT Rx visit     OT LONG TERM GOAL #2   Title With Maximum caregiver assistance Pt will be able to verbalize signs and symptoms of cellulitis infection and identify lymphedema precautions using printed resource (modified independence) for reference to decrease infection risk and limit LE progression over time.    Baseline dependent    Time 6    Period Days    Status New    Target Date --   6th OT Rx visit     OT LONG TERM GOAL #3   Title With Maximum caregiver assistance Pt >85 % compliant with all daily, LE self-care protocols for home program, including simple self-manual lymphatic drainage (MLD), skin care, lymphatic pumping ther ex, skin care, and compression wraps and garments  to limit LE progression and further functional decline.    Baseline dependent    Time 6    Period Weeks    Status New    Target Date --   6th OT Rx visit     OT LONG TERM GOAL #4   Baseline dependent    Time 6    Period Weeks    Status Partially Met      OT LONG TERM GOAL #5   Title Pt to tolerate compression wraps/ garments in keeping w/ prescribed daily wear regime within 1 week of issue date to progress and retain clinical and functional gains and to limit LE progression.    Baseline dependent    Time 6    Period Weeks    Status New    Target Date --   6th OT Rx visit     Long Term  Additional Goals   Additional Long Term Goals Yes      OT LONG TERM GOAL #6   Title Pt to achieve at least 10% LLE limb volume reduction bilaterally during modified Intensive Phase CDT to reduce infection risk, risk of recurrent leg wounds, to improve functional performance of basic and instrumental ADLs,  and to limit LE progression.    Baseline dependent    Time 6    Period Weeks    Status New    Target Date --   6th OT Rx visit                Plan - 07/06/20 1636    Clinical Impression Statement Completed initial LLE limb volumetric measurements. Will complete RLE measurements at next session. LLE is decreased in volume below the knee  by 6.4% since last measured on 07/23/18. Although these measurements and calculations ( volume of inverted cone)  reveal LLE volume is decreased from ankle to below knee, they do not capture dramatic increase in foot swelling, which I believe is partially due to overwrapping the distal leg   causing fluid to be pushed  distally by tourniquet effect. , Emphasis of visit onPt and caregiver edu for proper multilater compression wrapping using gradient techniques. Niece is able to apply wraps with moderate assist and cues to reduce tightness and correctly distribute tension. Cont as per POC carefully monitoring skin and continuing to teach wrap techniques until CG masters.    Occupational Profile and client history currently impacting functional performance dimensia, inconsistent CG assistance with removing wraps, providing skin care and reapplying compression between clinical visits. S    Occupational performance deficits (Please refer to evaluation for details): ADL's;IADL's;Rest and Sleep;Play;Leisure;Social Participation;Other   body image   Rehab Potential Fair    Clinical Decision Making Multiple treatment options, significant modification of task necessary    Comorbidities Affecting Occupational Performance: Presence of comorbidities impacting  occupational performance    Modification or Assistance to Complete Evaluation  Max significant modification of tasks or assist is necessary to complete    OT Frequency 1x / week    OT Duration 6 weeks   and PRN   OT Treatment/Interventions Self-care/ADL training;DME and/or AE instruction;Manual lymph drainage;Patient/family education;Compression bandaging;Therapeutic exercise;Manual Therapy;Other (comment)    Plan skin care to decrease infection risk and improve hydration    Recommended Other Services fit with adjustable , alternative compression wraps for optimal independence w/ donning and doffing. Cnsider CircAid with footpieces.    Consulted and Agree with Plan of Care Patient;Family member/caregiver           Patient will benefit from skilled therapeutic intervention in order to improve the following deficits and impairments:           Visit Diagnosis: Lymphedema, not elsewhere classified    Problem List Patient Active Problem List   Diagnosis Date Noted  . Medication noncompliance due to cognitive impairment 10/17/2019  . Chronic pain of multiple joints 08/01/2017  . Age-related osteoporosis without current pathological fracture 08/01/2017  . Left knee pain 09/19/2016  . Headache 06/22/2015  . Rectal benign neoplasm   . Asthma, chronic 04/04/2015  . history of Tubular adenoma of colon 04/04/2015  . 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 Hypokalemia 04/04/2015  . CKD (chronic kidney disease) stage 3, GFR 30-59 ml/min 04/03/2015  . Dehydration 04/03/2015  . History of colonic polyps 12/08/2014  . Edema 04/14/2014  . Chronic systolic heart failure ([place]) 11/20/2013  . Nausea with vomiting 11/13/2013  . Lymphedema of lower extremity 10/17/2013  . 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 MGUS (monoclonal gammopathy of unknown significance) 05/15/2012  . Vitamin D deficiency 01/29/2010  . BURSITIS, LEFT ELBOW 01/29/2010  . DERMATITIS 09/30/2009  . DIABETES  MELLITUS, BORDERLINE 01/11/2009  . Paranoid delusion ([place]) 08/02/2008  . Obstructive chronic  bronchitis ([place]) 08/02/2008  . Essential hypertension 11/11/2007  . Venous (peripheral) insufficiency 11/11/2007  . RENAL CALCULUS 11/11/2007  . LOW BACK PAIN, CHRONIC 11/11/2007    Andrey Spearman, MS, OTR/L, [facility] 07/06/20 4:45 PM  [place] MAIN [facility] SERVICES [address] [place], [place], [address] Phone: [phone]   Fax:  [phone]  Name: MARGURETTE BRENER MRN: [id number] Date of Birth: 03/30/1932

## 2020-07-08 ENCOUNTER — Encounter: Payer: Medicare Other | Admitting: Occupational Therapy

## 2020-07-13 ENCOUNTER — Other Ambulatory Visit: Payer: Self-pay

## 2020-07-13 ENCOUNTER — Encounter: Payer: Medicare Other | Admitting: Occupational Therapy

## 2020-07-13 ENCOUNTER — Ambulatory Visit: Payer: Medicare Other | Admitting: Occupational Therapy

## 2020-07-13 DIAGNOSIS — I890 Lymphedema, not elsewhere classified: Secondary | ICD-10-CM | POA: Diagnosis not present

## 2020-07-13 NOTE — Therapy (Signed)
[place] MAIN [facility] SERVICES [address] [place], [place], [address] Phone: [phone]   Fax:  [phone]  Occupational Therapy Treatment  Patient Details  Name: [name] MRN: [id number] Date of Birth: 12/07/1931 Referring Provider (OT): Sandrea Hughs, NP   Encounter Date: 07/13/2020   OT End of Session - 07/13/20 1633    Visit Number 3    Number of Visits 36    Date for OT Re-Evaluation 09/29/20    OT Start Time 0202    OT Stop Time 0310    OT Time Calculation (min) 68 min    Activity Tolerance Patient tolerated treatment well;No increased pain    Behavior During Therapy WFL for tasks assessed/performed           Past Medical History:  Diagnosis Date  . Anemia, unspecified   . Anxiety   . Arthritis   . Asthma   . Delusions ([place])   . Depression   . Edema 2005   "since put the glass in them"   . GERD (gastroesophageal reflux disease)   . Lumbago   . Lymphedema   . Obstructive chronic bronchitis without exacerbation ([place])   . Other abnormal glucose   . 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 Systolic heart failure (HCC)    EF 35 to 40% per echo January 2015  . Unspecified essential hypertension   . Unspecified nonpsychotic mental disorder   . Unspecified venous (peripheral) insufficiency   . Unspecified venous (peripheral) insufficiency   . Unspecified vitamin D deficiency     Past Surgical History:  Procedure Laterality Date  . APPENDECTOMY    . CERVICAL SPINE SURGERY     x 2  . CHOLECYSTECTOMY    . ESOPHAGOGASTRODUODENOSCOPY N/A 04/05/2015   Procedure: ESOPHAGOGASTRODUODENOSCOPY (EGD);  Surgeon: [name], MD;  Location: [facility];  Service: Endoscopy;  Laterality: N/A;  . FLEXIBLE SIGMOIDOSCOPY N/A 04/05/2015   Procedure: FLEXIBLE SIGMOIDOSCOPY;  Surgeon: [name], MD;  Location: [facility];  Service: Endoscopy;  Laterality: N/A;  . HAND SURGERY    . LUMBAR DISC SURGERY    . PARTIAL  HYSTERECTOMY    . TONSILLECTOMY      There were no vitals filed for this visit.   Subjective Assessment - 07/13/20 1628    Subjective  [name] presents for OT Rx visit 2/36 to address BLE lymphedma. Pt is accompanied by her nephew and a 2nd CG today. Pt presents with long stretch, ace wraps over single layer of Rosidal from in single layer from mid foot to popliteal. This CG has not been present for previous compression wrap education.    Patient is accompanied by: Family member    Pertinent History Chronic obstructive bronchitis, CVI, anxiety , systoloc heart failure, asthma, arthritis, multiple spinal surgeries w/ chronic back pain, DM,    Limitations Difficulty walking, impaired standing tolerance, impaired transfers, decreased joint AROM at toes, ankles and knees, chronic leg swelling , chronic pain. increased infection and falls risks, cognitive impairment, generaized weakness    Repetition Increases Symptoms    Special Tests + Stemmer bilaterally    Patient Stated Goals reduce swelling and legs feel better    Pain Onset Other (comment)   >20 years                       OT Treatments/Exercises (OP) - 07/13/20 0001      ADLs   ADL Education Given  Yes      Manual Therapy   Manual Therapy Edema management;Compression Bandaging    Manual therapy comments emphasis of visit on CG edu    Edema Management initial LLE limb volumetrics.     Compression Bandaging LLE compression wrap using 2 short stretch wraps, 8 and 10 cm wide, over single layer Rosidal foal (0.4 cm) from base of toes to tibial tuberosity.                   OT Education - 07/13/20 1631    Education provided Yes    Education Details Provided Pt and family education for second caregiver who was not present when we provided training for gradient compression wrapping with short stretch bandages. Reviewed all techniques and purpose of using this technology instead of Ace wraps.    Person(s)  Educated Patient;Caregiver(s);Other (comment)    Methods Explanation;Demonstration;Handout    Comprehension Verbalized understanding;Returned demonstration;Need further instruction;Verbal cues required               OT Long Term Goals - 07/01/20 1618      OT LONG TERM GOAL #1   Title With maximum caregiver assistance Pt will be able to apply knee length, multi-layer, short stretch compression wraps daily from ankle to tibial tuberosity on one leg at a time using correct gradient techniques to return affected limb/s, as closely as possible, to premorbid size and shape, to limit leg pain and infection risk, and to improve safe functional mobility and ADLs performance.    Baseline dependent    Time 4    Period Days    Status New    Target Date --   6th OT Rx visit     OT LONG TERM GOAL #2   Title With Maximum caregiver assistance Pt will be able to verbalize signs and symptoms of cellulitis infection and identify lymphedema precautions using printed resource (modified independence) for reference to decrease infection risk and limit LE progression over time.    Baseline dependent    Time 6    Period Days    Status New    Target Date --   6th OT Rx visit     OT LONG TERM GOAL #3   Title With Maximum caregiver assistance Pt >85 % compliant with all daily, LE self-care protocols for home program, including simple self-manual lymphatic drainage (MLD), skin care, lymphatic pumping ther ex, skin care, and compression wraps and garments  to limit LE progression and further functional decline.    Baseline dependent    Time 6    Period Weeks    Status New    Target Date --   6th OT Rx visit     OT LONG TERM GOAL #4   Baseline dependent    Time 6    Period Weeks    Status Partially Met      OT LONG TERM GOAL #5   Title Pt to tolerate compression wraps/ garments in keeping w/ prescribed daily wear regime within 1 week of issue date to progress and retain clinical and functional gains and  to limit LE progression.    Baseline dependent    Time 6    Period Weeks    Status New    Target Date --   6th OT Rx visit     Long Term Additional Goals   Additional Long Term Goals Yes      OT LONG TERM GOAL #6   Title Pt to achieve  at least 10% LLE limb volume reduction bilaterally during modified Intensive Phase CDT to reduce infection risk, risk of recurrent leg wounds, to improve functional performance of basic and instrumental ADLs, and to limit LE progression.    Baseline dependent    Time 6    Period Weeks    Status New    Target Date --   6th OT Rx visit                Plan - 07/13/20 1633    Clinical Impression Statement Repeated Pt and caregicer educaTION FOR gradient compression wraps for a 2nd family member today. Explained clinical rational for short stretch wraps instead of ace wraps based on differential constriuction and purposes.. It's apparent looking at L Leg that long stretch, tight, ace wraps have significantly contributed to enlargement of the feet by pushing fluid distally and trapping high density , protoien rich edema in the tissues, resulting in hard , imobile tissue fibrosis. Provided abbreviated skin care and MLD as well, with minimal skin mobility change during session. Skin flaking off revealing small , pinhole sized lymphatic fistulas in skin at ankle and foot. This drainage hardens onto stocinett andincreases infection risk. Repeated need to obttain non-stick type wound dressings to limit tearing skin each time wraps are removed. Today we used water on the stockette to soften exudate and remove stockinett. Caregivcers and family members do not seem to be communicating about  instructions between sessions.    Occupational Profile and client history currently impacting functional performance dimensia, inconsistent CG assistance with removing wraps, providing skin care and reapplying compression between clinical visits. S    Occupational performance  deficits (Please refer to evaluation for details): ADL's;IADL's;Rest and Sleep;Play;Leisure;Social Participation;Other   body image   Rehab Potential Fair    Clinical Decision Making Multiple treatment options, significant modification of task necessary    Comorbidities Affecting Occupational Performance: Presence of comorbidities impacting occupational performance    Modification or Assistance to Complete Evaluation  Max significant modification of tasks or assist is necessary to complete    OT Frequency 1x / week    OT Duration 6 weeks   and PRN   OT Treatment/Interventions Self-care/ADL training;DME and/or AE instruction;Manual lymph drainage;Patient/family education;Compression bandaging;Therapeutic exercise;Manual Therapy;Other (comment)    Plan skin care to decrease infection risk and improve hydration    Recommended Other Services fit with adjustable , alternative compression wraps for optimal independence w/ donning and doffing. Cnsider CircAid with footpieces.    Consulted and Agree with Plan of Care Patient;Family member/caregiver           Patient will benefit from skilled therapeutic intervention in order to improve the following deficits and impairments:           Visit Diagnosis: Lymphedema, not elsewhere classified    Problem List Patient Active Problem List   Diagnosis Date Noted  . Medication noncompliance due to cognitive impairment 10/17/2019  . Chronic pain of multiple joints 08/01/2017  . Age-related osteoporosis without current pathological fracture 08/01/2017  . Left knee pain 09/19/2016  . Headache 06/22/2015  . Rectal benign neoplasm   . Asthma, chronic 04/04/2015  . history of Tubular adenoma of colon 04/04/2015  . 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 Hypokalemia 04/04/2015  . CKD (chronic kidney disease) stage 3, GFR 30-59 ml/min 04/03/2015  . Dehydration 04/03/2015  . History of colonic polyps 12/08/2014  . Edema 04/14/2014  . Chronic  systolic heart failure ([place]) 11/20/2013  . Nausea  with vomiting 11/13/2013  . Lymphedema of lower extremity 10/17/2013  . 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 MGUS (monoclonal gammopathy of unknown significance) 05/15/2012  . Vitamin D deficiency 01/29/2010  . BURSITIS, LEFT ELBOW 01/29/2010  . DERMATITIS 09/30/2009  . DIABETES MELLITUS, BORDERLINE 01/11/2009  . Paranoid delusion ([place]) 08/02/2008  . Obstructive chronic bronchitis ([place]) 08/02/2008  . Essential hypertension 11/11/2007  . Venous (peripheral) insufficiency 11/11/2007  . RENAL CALCULUS 11/11/2007  . LOW BACK PAIN, CHRONIC 11/11/2007    Andrey Spearman, MS, OTR/L, [facility] 07/13/20 4:41 PM   [place] MAIN [facility] SERVICES [address] [place], [place], [address] Phone: [phone]   Fax:  [phone]  Name: QUINLYNN CUTHBERT MRN: [id number] Date of Birth: 1932/10/18

## 2020-07-14 ENCOUNTER — Ambulatory Visit: Payer: Medicare Other | Admitting: Nurse Practitioner

## 2020-07-15 ENCOUNTER — Encounter: Payer: Medicare Other | Admitting: Occupational Therapy

## 2020-07-20 ENCOUNTER — Ambulatory Visit: Payer: Medicare Other | Admitting: Occupational Therapy

## 2020-07-27 ENCOUNTER — Ambulatory Visit: Payer: Medicare Other | Admitting: Occupational Therapy

## 2020-07-27 ENCOUNTER — Other Ambulatory Visit: Payer: Self-pay

## 2020-07-27 DIAGNOSIS — I890 Lymphedema, not elsewhere classified: Secondary | ICD-10-CM

## 2020-07-27 NOTE — Therapy (Signed)
[facility] [address] , [place], [address] Phone: [phone]   Fax:  [phone]  Occupational Therapy Treatment  Patient Details  Name: [name] MRN: [id number] Date of Birth: 08/17/1932 Referring Provider (OT): Sophia C Ngetich, NP   Encounter Date: 07/27/2020   OT End of Session - 07/27/20 1719    Visit Number 4    Number of Visits 36    Date for OT Re-Evaluation 09/29/20    Activity Tolerance Patient tolerated treatment well;No increased pain    Behavior During Therapy WFL for tasks assessed/performed           Past Medical History:  Diagnosis Date  . Anemia, unspecified   . Anxiety   . Arthritis   . Asthma   . Delusions (HCC)   . Depression   . Edema 2005   "since put the glass in them"   . GERD (gastroesophageal reflux disease)   . Lumbago   . Lymphedema   . Obstructive chronic bronchitis without exacerbation (HCC)   . Other abnormal glucose   . 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 Systolic heart failure (HCC)    EF 35 to 40% per echo January 2015  . Unspecified essential hypertension   . Unspecified nonpsychotic mental disorder   . Unspecified venous (peripheral) insufficiency   . Unspecified venous (peripheral) insufficiency   . Unspecified vitamin D deficiency     Past Surgical History:  Procedure Laterality Date  . APPENDECTOMY    . CERVICAL SPINE SURGERY     x 2  . CHOLECYSTECTOMY    . ESOPHAGOGASTRODUODENOSCOPY N/A 04/05/2015   Procedure: ESOPHAGOGASTRODUODENOSCOPY (EGD);  Surgeon: Sophia T Stark, MD;  Location: [facility];  Service: Endoscopy;  Laterality: N/A;  . FLEXIBLE SIGMOIDOSCOPY N/A 04/05/2015   Procedure: FLEXIBLE SIGMOIDOSCOPY;  Surgeon: Sophia T Stark, MD;  Location: [facility];  Service: Endoscopy;  Laterality: N/A;  . HAND SURGERY    . LUMBAR DISC SURGERY    . PARTIAL HYSTERECTOMY    . TONSILLECTOMY      There were no vitals filed for this visit.    Subjective Assessment - 07/27/20 1417    Subjective  [name] presents for OT Rx visit 4/36 to address BLE lymphedma. Pt is accompanied by her nephew, [name], today. She presents with compression wraps on both legs, but wraps are incorrect again. Instead of the multi layer gradient wrap configuration using short stretch wraps taught to caregivers over multiple sessions, [name] presents with multi layer ace wraps over a layer of Rosidal foam on the R leg, and  2 short stretch wraps without foam underlayment or gradient  as instructed on the LLE.    Patient is accompanied by: Family member    Pertinent History Chronic obstructive bronchitis, CVI, anxiety , systoloc heart failure, asthma, arthritis, multiple spinal surgeries w/ chronic back pain, DM,    Limitations Difficulty walking, impaired standing tolerance, impaired transfers, decreased joint AROM at toes, ankles and knees, chronic leg swelling , chronic pain. increased infection and falls risks, cognitive impairment, generaized weakness    Repetition Increases Symptoms    Special Tests + Stemmer bilaterally    Patient Stated Goals reduce swelling and legs feel better    Pain Onset Other (comment)   >20 years                       OT Treatments/Exercises (OP) - 07/27/20 0001        ADLs   ADL Education Given Yes      Manual Therapy   Manual Therapy Edema management;Compression Bandaging;Manual Lymphatic Drainage (MLD)    Manual therapy comments skin of L foot and distal leg flaking off in large sheets when removing cotton stockinett that appears to have been in place for more than one day. Performed skin care throughout session to improve hydration and decrease infection risk    Manual Lymphatic Drainage (MLD) MLD to LLE as established. Pants worn to clinic severly limit access to inguinal LN      for proximal MLD.    Compression Bandaging LLE short stretch compression wraps using gradient techniques to the LLE from toes  to popliteal fossa- as instructed. OT did not remove RLE comrpession wraps in clinic     due to time constraints. This is not typically done as CDT focuses on a single limb at a time to limit falls risk.                  OT Education - 07/27/20 1717    Education provided Yes    Education Details Provided Pt and family education for second caregiver who was not present when we provided training for gradient compression wrapping with short stretch bandages. Reviewed all techniques and purpose of using this technology instead of Ace wraps.    Person(s) Educated Patient;Caregiver(s);Other (comment)    Methods Explanation;Demonstration;Handout    Comprehension Verbalized understanding;Returned demonstration;Need further instruction;Verbal cues required    Education Details Cont CG edu for correct gradient compression application as it continues to be applied incorrectly each visits despite multiple teaching sessions.    Person(s) Educated Patient;Caregiver(s)    Methods Explanation;Demonstration    Comprehension Verbalized understanding;Returned demonstration;Need further instruction               OT Long Term Goals - 07/01/20 1618      OT LONG TERM GOAL #1   Title With maximum caregiver assistance Pt will be able to apply knee length, multi-layer, short stretch compression wraps daily from ankle to tibial tuberosity on one leg at a time using correct gradient techniques to return affected limb/s, as closely as possible, to premorbid size and shape, to limit leg pain and infection risk, and to improve safe functional mobility and ADLs performance.    Baseline dependent    Time 4    Period Days    Status New    Target Date --   6th OT Rx visit     OT LONG TERM GOAL #2   Title With Maximum caregiver assistance Pt will be able to verbalize signs and symptoms of cellulitis infection and identify lymphedema precautions using printed resource (modified independence) for reference to  decrease infection risk and limit LE progression over time.    Baseline dependent    Time 6    Period Days    Status New    Target Date --   6th OT Rx visit     OT LONG TERM GOAL #3   Title With Maximum caregiver assistance Pt >85 % compliant with all daily, LE self-care protocols for home program, including simple self-manual lymphatic drainage (MLD), skin care, lymphatic pumping ther ex, skin care, and compression wraps and garments  to limit LE progression and further functional decline.    Baseline dependent    Time 6    Period Weeks    Status New    Target Date --   6th OT Rx visit       OT LONG TERM GOAL #4   Baseline dependent    Time 6    Period Weeks    Status Partially Met      OT LONG TERM GOAL #5   Title Pt to tolerate compression wraps/ garments in keeping w/ prescribed daily wear regime within 1 week of issue date to progress and retain clinical and functional gains and to limit LE progression.    Baseline dependent    Time 6    Period Weeks    Status New    Target Date --   6th OT Rx visit     Long Term Additional Goals   Additional Long Term Goals Yes      OT LONG TERM GOAL #6   Title Pt to achieve at least 10% LLE limb volume reduction bilaterally during modified Intensive Phase CDT to reduce infection risk, risk of recurrent leg wounds, to improve functional performance of basic and instrumental ADLs, and to limit LE progression.    Baseline dependent    Time 6    Period Weeks    Status New    Target Date --   6th OT Rx visit                Plan - 07/27/20 1719    Clinical Impression Statement Gradient compression wrapping goal not met within initial treatment visits. Goal is extended to "ongoing". Caregivers continue to struggle with correct application of gradient compression wraps and proper skin care between OT sessions. Next visit CG will instruct OT with wrapping as teaching exercise. Minimal decrease in L leg swelling noted today. Skin is  indurated and immobile. ZCont with CDT as proposed in POC.    Occupational Profile and client history currently impacting functional performance dimensia, inconsistent CG assistance with removing wraps, providing skin care and reapplying compression between clinical visits. S    Occupational performance deficits (Please refer to evaluation for details): ADL's;IADL's;Rest and Sleep;Play;Leisure;Social Participation;Other   body image   Rehab Potential Fair    Clinical Decision Making Multiple treatment options, significant modification of task necessary    Comorbidities Affecting Occupational Performance: Presence of comorbidities impacting occupational performance    Modification or Assistance to Complete Evaluation  Max significant modification of tasks or assist is necessary to complete    OT Frequency 1x / week    OT Duration 6 weeks   and PRN   OT Treatment/Interventions Self-care/ADL training;DME and/or AE instruction;Manual lymph drainage;Patient/family education;Compression bandaging;Therapeutic exercise;Manual Therapy;Other (comment)    Plan skin care to decrease infection risk and improve hydration    Recommended Other Services fit with adjustable , alternative compression wraps for optimal independence w/ donning and doffing. Cnsider CircAid with footpieces.    Consulted and Agree with Plan of Care Patient;Family member/caregiver           Patient will benefit from skilled therapeutic intervention in order to improve the following deficits and impairments:           Visit Diagnosis: Lymphedema, not elsewhere classified    Problem List Patient Active Problem List   Diagnosis Date Noted  . Medication noncompliance due to cognitive impairment 10/17/2019  . Chronic pain of multiple joints 08/01/2017  . Age-related osteoporosis without current pathological fracture 08/01/2017  . Left knee pain 09/19/2016  . Headache 06/22/2015  . Rectal benign neoplasm   . Asthma, chronic  04/04/2015  . history of Tubular adenoma of colon 04/04/2015  . 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 Hypokalemia   04/04/2015  . CKD (chronic kidney disease) stage 3, GFR 30-59 ml/min 04/03/2015  . Dehydration 04/03/2015  . History of colonic polyps 12/08/2014  . Edema 04/14/2014  . Chronic systolic heart failure (HCC) 11/20/2013  . Nausea with vomiting 11/13/2013  . Lymphedema of lower extremity 10/17/2013  . 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 MGUS (monoclonal gammopathy of unknown significance) 05/15/2012  . Vitamin D deficiency 01/29/2010  . BURSITIS, LEFT ELBOW 01/29/2010  . DERMATITIS 09/30/2009  . DIABETES MELLITUS, BORDERLINE 01/11/2009  . Paranoid delusion (HCC) 08/02/2008  . Obstructive chronic bronchitis (HCC) 08/02/2008  . Essential hypertension 11/11/2007  . Venous (peripheral) insufficiency 11/11/2007  . RENAL CALCULUS 11/11/2007  . LOW BACK PAIN, CHRONIC 11/11/2007     , MS, OTR/L, CLT-LANA 07/27/20 5:23 PM   [facility] [address] [place], [place], [address] Phone: [phone]   Fax:  [phone]  Name: [name] MRN: [id number] Date of Birth: 11/20/1931 

## 2020-07-29 ENCOUNTER — Encounter: Payer: Medicare Other | Admitting: Occupational Therapy

## 2020-08-03 ENCOUNTER — Ambulatory Visit: Payer: Medicare Other | Admitting: Occupational Therapy

## 2020-08-10 ENCOUNTER — Ambulatory Visit: Payer: Medicare Other | Admitting: Occupational Therapy

## 2020-08-19 ENCOUNTER — Ambulatory Visit: Payer: Medicare Other | Attending: Family | Admitting: Occupational Therapy

## 2020-08-19 ENCOUNTER — Other Ambulatory Visit: Payer: Self-pay

## 2020-08-19 DIAGNOSIS — I890 Lymphedema, not elsewhere classified: Secondary | ICD-10-CM | POA: Diagnosis not present

## 2020-08-19 NOTE — Therapy (Signed)
[place] MAIN [facility] SERVICES [address] [place], [place], [address] Phone: [phone]   Fax:  [phone]  Occupational Therapy Treatment  Patient Details  Name: [name] MRN: [id number] Date of Birth: 09/06/1932 Referring Provider (OT): Sandrea Hughs, NP   Encounter Date: 08/19/2020   OT End of Session - 08/19/20 1624    Visit Number 5    Number of Visits 36    Date for OT Re-Evaluation 09/29/20    OT Start Time 0200    OT Stop Time 0300    OT Time Calculation (min) 60 min    Activity Tolerance Patient tolerated treatment well;No increased pain    Behavior During Therapy WFL for tasks assessed/performed           Past Medical History:  Diagnosis Date   Anemia, unspecified    Anxiety    Arthritis    Asthma    Delusions ([place])    Depression    Edema 2005   "since put the glass in them"    GERD (gastroesophageal reflux disease)    Lumbago    Lymphedema    Obstructive chronic bronchitis without exacerbation ([place])    Other abnormal glucose    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[place]) 01/31/101   Systolic heart failure ([place])    EF 35 to 40% per echo January 2015   Unspecified essential hypertension    Unspecified nonpsychotic mental disorder    Unspecified venous (peripheral) insufficiency    Unspecified venous (peripheral) insufficiency    Unspecified vitamin D deficiency     Past Surgical History:  Procedure Laterality Date   APPENDECTOMY     CERVICAL SPINE SURGERY     x 2   CHOLECYSTECTOMY     ESOPHAGOGASTRODUODENOSCOPY N/A 04/05/2015   Procedure: ESOPHAGOGASTRODUODENOSCOPY (EGD);  Surgeon: [name], MD;  Location: [facility];  Service: Endoscopy;  Laterality: N/A;   FLEXIBLE SIGMOIDOSCOPY N/A 04/05/2015   Procedure: FLEXIBLE SIGMOIDOSCOPY;  Surgeon: [name], MD;  Location: [facility];  Service: Endoscopy;  Laterality: N/A;   HAND SURGERY     LUMBAR DISC SURGERY     PARTIAL  HYSTERECTOMY     TONSILLECTOMY      There were no vitals filed for this visit.   Subjective Assessment - 08/19/20 1412    Subjective  [name] presents for OT Rx visit 5/36 to address BLE lymphedma. Pt was last seen on 07/27/20 due to illness. Pt is accompanied by her nephew, [name], today. She reports 8/10 leg soreness.    Patient is accompanied by: Family member    Pertinent History Chronic obstructive bronchitis, CVI, anxiety , systoloc heart failure, asthma, arthritis, multiple spinal surgeries w/ chronic back pain, DM,    Limitations Difficulty walking, impaired standing tolerance, impaired transfers, decreased joint AROM at toes, ankles and knees, chronic leg swelling , chronic pain. increased infection and falls risks, cognitive impairment, generaized weakness    Repetition Increases Symptoms    Special Tests + Stemmer bilaterally    Patient Stated Goals reduce swelling and legs feel better    Pain Onset Other (comment)   >20 years                       OT Treatments/Exercises (OP) - 08/19/20 0001      ADLs   ADL Education Given Yes (P)       Manual Therapy   Manual Therapy Edema management;Compression Bandaging;Manual Lymphatic Drainage (MLD) (P)  OT Long Term Goals - 07/01/20 1618      OT LONG TERM GOAL #1   Title With maximum caregiver assistance Pt will be able to apply knee length, multi-layer, short stretch compression wraps daily from ankle to tibial tuberosity on one leg at a time using correct gradient techniques to return affected limb/s, as closely as possible, to premorbid size and shape, to limit leg pain and infection risk, and to improve safe functional mobility and ADLs performance.    Baseline dependent    Time 4    Period Days    Status New    Target Date --   6th OT Rx visit     OT LONG TERM GOAL #2   Title With Maximum caregiver assistance Pt will be able to verbalize signs and symptoms of  cellulitis infection and identify lymphedema precautions using printed resource (modified independence) for reference to decrease infection risk and limit LE progression over time.    Baseline dependent    Time 6    Period Days    Status New    Target Date --   6th OT Rx visit     OT LONG TERM GOAL #3   Title With Maximum caregiver assistance Pt >85 % compliant with all daily, LE self-care protocols for home program, including simple self-manual lymphatic drainage (MLD), skin care, lymphatic pumping ther ex, skin care, and compression wraps and garments  to limit LE progression and further functional decline.    Baseline dependent    Time 6    Period Weeks    Status New    Target Date --   6th OT Rx visit     OT LONG TERM GOAL #4   Baseline dependent    Time 6    Period Weeks    Status Partially Met      OT LONG TERM GOAL #5   Title Pt to tolerate compression wraps/ garments in keeping w/ prescribed daily wear regime within 1 week of issue date to progress and retain clinical and functional gains and to limit LE progression.    Baseline dependent    Time 6    Period Weeks    Status New    Target Date --   6th OT Rx visit     Long Term Additional Goals   Additional Long Term Goals Yes      OT LONG TERM GOAL #6   Title Pt to achieve at least 10% LLE limb volume reduction bilaterally during modified Intensive Phase CDT to reduce infection risk, risk of recurrent leg wounds, to improve functional performance of basic and instrumental ADLs, and to limit LE progression.    Baseline dependent    Time 6    Period Weeks    Status New    Target Date --   6th OT Rx visit                Plan - 08/19/20 1624    Clinical Impression Statement Pt and nephew understand limited attendance will result in decreased prognosis. BLE very dry today with large flakes of skin shedding from between toes, feet and distal legs. No bleeding observed. No signs or symptoms of infection noted on  LLE. Pt reports she is "staying wrapped." Unsure who applies wraps at present as niece who was staying with and assisting Pt has returned home. Swelling is moderately reduced at L foot. Pt tolerated MLD, skin care and gradient compression wrap to LLE today without  increased pain. Cont as per POC.    Occupational Profile and client history currently impacting functional performance dimensia, inconsistent CG assistance with removing wraps, providing skin care and reapplying compression between clinical visits. S    Occupational performance deficits (Please refer to evaluation for details): ADL's;IADL's;Rest and Sleep;Play;Leisure;Social Participation;Other   body image   Rehab Potential Fair    Clinical Decision Making Multiple treatment options, significant modification of task necessary    Comorbidities Affecting Occupational Performance: Presence of comorbidities impacting occupational performance    Modification or Assistance to Complete Evaluation  Max significant modification of tasks or assist is necessary to complete    OT Frequency 1x / week    OT Duration 6 weeks   and PRN   OT Treatment/Interventions Self-care/ADL training;DME and/or AE instruction;Manual lymph drainage;Patient/family education;Compression bandaging;Therapeutic exercise;Manual Therapy;Other (comment)    Plan skin care to decrease infection risk and improve hydration    Recommended Other Services fit with adjustable , alternative compression wraps for optimal independence w/ donning and doffing. Cnsider CircAid with footpieces.    Consulted and Agree with Plan of Care Patient;Family member/caregiver           Patient will benefit from skilled therapeutic intervention in order to improve the following deficits and impairments:           Visit Diagnosis: Lymphedema, not elsewhere classified    Problem List Patient Active Problem List   Diagnosis Date Noted   Medication noncompliance due to cognitive impairment  10/17/2019   Chronic pain of multiple joints 08/01/2017   Age-related osteoporosis without current pathological fracture 08/01/2017   Left knee pain 09/19/2016   Headache 06/22/2015   Rectal benign neoplasm    Asthma, chronic 04/04/2015   history of Tubular adenoma of colon 04/04/2015   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   Hypokalemia 04/04/2015   CKD (chronic kidney disease) stage 3, GFR 30-59 ml/min (HCC) 04/03/2015   Dehydration 04/03/2015   History of colonic polyps 12/08/2014   Edema 16/07/9603   Chronic systolic heart failure ([place]) 11/20/2013   Nausea with vomiting 11/13/2013   Lymphedema of lower extremity 10/17/2013   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[place]) 04/04/2013   MGUS (monoclonal gammopathy of unknown significance) 05/15/2012   Vitamin D deficiency 01/29/2010   BURSITIS, LEFT ELBOW 01/29/2010   DERMATITIS 09/30/2009   DIABETES MELLITUS, BORDERLINE 01/11/2009   Paranoid delusion ([place]) 08/02/2008   Obstructive chronic bronchitis ([place]) 08/02/2008   Essential hypertension 11/11/2007   Venous (peripheral) insufficiency 11/11/2007   RENAL CALCULUS 11/11/2007   LOW BACK PAIN, CHRONIC 11/11/2007    Andrey Spearman, MS, OTR/L, CLT-LANA 08/19/20 4:30 PM   [place] [facility] MAIN [facility] SERVICES [address] [place], [place], [address] Phone: [phone]   Fax:  [phone]  Name: STARLETTE THUROW MRN: [id number] Date of Birth: 13-Apr-1932

## 2020-08-19 NOTE — Therapy (Signed)
[facility] °[address] °[place], [place], [address] °Phone: [phone]   Fax:  [phone] ° °Occupational Therapy Treatment ° °Patient Details  °Name: [name] °MRN: [id number] °Date of Birth: 07/06/1932 °Referring Provider (OT): Dinah C Ngetich, NP ° ° °Encounter Date: 08/19/2020 ° ° OT End of Session - 08/19/20 1624   ° Visit Number 5   ° Number of Visits 36   ° Date for OT Re-Evaluation 09/29/20   ° OT Start Time 0200   ° OT Stop Time 0300   ° OT Time Calculation (min) 60 min   ° Activity Tolerance Patient tolerated treatment well;No increased pain   ° Behavior During Therapy WFL for tasks assessed/performed   °  °  °  ° ° °Past Medical History:  °Diagnosis Date  °• Anemia, unspecified   °• Anxiety   °• Arthritis   °• Asthma   °• Delusions (HCC)   °• Depression   °• Edema 2005  ° "since put the glass in them"   °• GERD (gastroesophageal reflux disease)   °• Lumbago   °• Lymphedema   °• Obstructive chronic bronchitis without exacerbation (HCC)   °• Other abnormal glucose   °• 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  °• Systolic heart failure (HCC)   ° EF 35 to 40% per echo January 2015  °• Unspecified essential hypertension   °• Unspecified nonpsychotic mental disorder   °• Unspecified venous (peripheral) insufficiency   °• Unspecified venous (peripheral) insufficiency   °• Unspecified vitamin D deficiency   ° ° °Past Surgical History:  °Procedure Laterality Date  °• APPENDECTOMY    °• CERVICAL SPINE SURGERY    ° x 2  °• CHOLECYSTECTOMY    °• ESOPHAGOGASTRODUODENOSCOPY N/A 04/05/2015  ° Procedure: ESOPHAGOGASTRODUODENOSCOPY (EGD);  Surgeon: Malcolm T Stark, MD;  Location: [facility];  Service: Endoscopy;  Laterality: N/A;  °• FLEXIBLE SIGMOIDOSCOPY N/A 04/05/2015  ° Procedure: FLEXIBLE SIGMOIDOSCOPY;  Surgeon: Malcolm T Stark, MD;  Location: [facility];  Service: Endoscopy;  Laterality: N/A;  °• HAND SURGERY    °• LUMBAR DISC SURGERY    °• PARTIAL  HYSTERECTOMY    °• TONSILLECTOMY    ° ° °There were no vitals filed for this visit. ° ° Subjective Assessment - 08/19/20 1412   ° Subjective  [name] presents for OT Rx visit 5/36 to address BLE lymphedma. Pt was last seen on 07/27/20 due to illness. Pt is accompanied by her nephew, Alan, today. She reports 8/10 leg soreness.   ° Patient is accompanied by: Family member   ° Pertinent History Chronic obstructive bronchitis, CVI, anxiety , systoloc heart failure, asthma, arthritis, multiple spinal surgeries w/ chronic back pain, DM,   ° Limitations Difficulty walking, impaired standing tolerance, impaired transfers, decreased joint AROM at toes, ankles and knees, chronic leg swelling , chronic pain. increased infection and falls risks, cognitive impairment, generaized weakness   ° Repetition Increases Symptoms   ° Special Tests + Stemmer bilaterally   ° Patient Stated Goals reduce swelling and legs feel better   ° Pain Onset Other (comment)   >20 years  °  °  °  ° ° ° ° ° ° ° ° ° ° ° ° ° ° ° OT Treatments/Exercises (OP) - 08/19/20 0001   °  ° ADLs  ° ADL Education Given Yes (P)    °  ° Manual Therapy  ° Manual Therapy Edema management;Compression Bandaging;Manual Lymphatic Drainage (MLD) (P)    °  °  °  ° ° ° ° ° ° ° ° ° ° ° ° ° °   OT Long Term Goals - 07/01/20 1618   °  ° OT LONG TERM GOAL #1  ° Title With maximum caregiver assistance Pt will be able to apply knee length, multi-layer, short stretch compression wraps daily from ankle to tibial tuberosity on one leg at a time using correct gradient techniques to return affected limb/s, as closely as possible, to premorbid size and shape, to limit leg pain and infection risk, and to improve safe functional mobility and ADLs performance.   ° Baseline dependent   ° Time 4   ° Period Days   ° Status New   ° Target Date --   6th OT Rx visit  °  ° OT LONG TERM GOAL #2  ° Title With Maximum caregiver assistance Pt will be able to verbalize signs and symptoms of  cellulitis infection and identify lymphedema precautions using printed resource (modified independence) for reference to decrease infection risk and limit LE progression over time.   ° Baseline dependent   ° Time 6   ° Period Days   ° Status New   ° Target Date --   6th OT Rx visit  °  ° OT LONG TERM GOAL #3  ° Title With Maximum caregiver assistance Pt >85 % compliant with all daily, LE self-care protocols for home program, including simple self-manual lymphatic drainage (MLD), skin care, lymphatic pumping ther ex, skin care, and compression wraps and garments  to limit LE progression and further functional decline.   ° Baseline dependent   ° Time 6   ° Period Weeks   ° Status New   ° Target Date --   6th OT Rx visit  °  ° OT LONG TERM GOAL #4  ° Baseline dependent   ° Time 6   ° Period Weeks   ° Status Partially Met   °  ° OT LONG TERM GOAL #5  ° Title Pt to tolerate compression wraps/ garments in keeping w/ prescribed daily wear regime within 1 week of issue date to progress and retain clinical and functional gains and to limit LE progression.   ° Baseline dependent   ° Time 6   ° Period Weeks   ° Status New   ° Target Date --   6th OT Rx visit  °  ° Long Term Additional Goals  ° Additional Long Term Goals Yes   °  ° OT LONG TERM GOAL #6  ° Title Pt to achieve at least 10% LLE limb volume reduction bilaterally during modified Intensive Phase CDT to reduce infection risk, risk of recurrent leg wounds, to improve functional performance of basic and instrumental ADLs, and to limit LE progression.   ° Baseline dependent   ° Time 6   ° Period Weeks   ° Status New   ° Target Date --   6th OT Rx visit  °  °  °  ° ° ° ° ° ° ° ° Plan - 08/19/20 1624   ° Clinical Impression Statement Pt and nephew understand limited attendance will result in decreased prognosis. BLE very dry today with large flakes of skin shedding from between toes, feet and distal legs. No bleeding observed. No signs or symptoms of infection noted on  LLE. Pt reports she is "staying wrapped." Unsure who applies wraps at present as niece who was staying with and assisting Pt has returned home. Swelling is moderately reduced at L foot. Pt tolerated MLD, skin care and gradient compression wrap to LLE today without   increased pain. Cont as per POC.   ° Occupational Profile and client history currently impacting functional performance dimensia, inconsistent CG assistance with removing wraps, providing skin care and reapplying compression between clinical visits. S   ° Occupational performance deficits (Please refer to evaluation for details): ADL's;IADL's;Rest and Sleep;Play;Leisure;Social Participation;Other   body image  ° Rehab Potential Fair   ° Clinical Decision Making Multiple treatment options, significant modification of task necessary   ° Comorbidities Affecting Occupational Performance: Presence of comorbidities impacting occupational performance   ° Modification or Assistance to Complete Evaluation  Max significant modification of tasks or assist is necessary to complete   ° OT Frequency 1x / week   ° OT Duration 6 weeks   and PRN  ° OT Treatment/Interventions Self-care/ADL training;DME and/or AE instruction;Manual lymph drainage;Patient/family education;Compression bandaging;Therapeutic exercise;Manual Therapy;Other (comment)   ° Plan skin care to decrease infection risk and improve hydration   ° Recommended Other Services fit with adjustable , alternative compression wraps for optimal independence w/ donning and doffing. Cnsider CircAid with footpieces.   ° Consulted and Agree with Plan of Care Patient;Family member/caregiver   °  °  °  ° ° °Patient will benefit from skilled therapeutic intervention in order to improve the following deficits and impairments:   °  °  °  ° ° °Visit Diagnosis: °Lymphedema, not elsewhere classified ° ° ° °Problem List °Patient Active Problem List  ° Diagnosis Date Noted  °• Medication noncompliance due to cognitive impairment  10/17/2019  °• Chronic pain of multiple joints 08/01/2017  °• Age-related osteoporosis without current pathological fracture 08/01/2017  °• Left knee pain 09/19/2016  °• Headache 06/22/2015  °• Rectal benign neoplasm   °• Asthma, chronic 04/04/2015  °• history of Tubular adenoma of colon 04/04/2015  °• 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  °• Hypokalemia 04/04/2015  °• CKD (chronic kidney disease) stage 3, GFR 30-59 ml/min (HCC) 04/03/2015  °• Dehydration 04/03/2015  °• History of colonic polyps 12/08/2014  °• Edema 04/14/2014  °• Chronic systolic heart failure (HCC) 11/20/2013  °• Nausea with vomiting 11/13/2013  °• Lymphedema of lower extremity 10/17/2013  °• 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  °• MGUS (monoclonal gammopathy of unknown significance) 05/15/2012  °• Vitamin D deficiency 01/29/2010  °• BURSITIS, LEFT ELBOW 01/29/2010  °• DERMATITIS 09/30/2009  °• DIABETES MELLITUS, BORDERLINE 01/11/2009  °• Paranoid delusion (HCC) 08/02/2008  °• Obstructive chronic bronchitis (HCC) 08/02/2008  °• Essential hypertension 11/11/2007  °• Venous (peripheral) insufficiency 11/11/2007  °• RENAL CALCULUS 11/11/2007  °• LOW BACK PAIN, CHRONIC 11/11/2007  ° ° °Gloyd Happ, MS, OTR/L, CLT-LANA °08/19/20 4:30 PM ° ° °[place] °[facility] °[address] °[place], [place], [address] °Phone: [phone]   Fax:  [phone] ° °Name: [name] °MRN: [id number] °Date of Birth: 01/08/1932 °

## 2020-08-24 ENCOUNTER — Other Ambulatory Visit: Payer: Self-pay

## 2020-08-24 ENCOUNTER — Ambulatory Visit: Payer: Medicare Other | Admitting: Occupational Therapy

## 2020-08-24 DIAGNOSIS — I890 Lymphedema, not elsewhere classified: Secondary | ICD-10-CM

## 2020-08-24 NOTE — Therapy (Signed)
[place] [facility] [address] [place], [place], [address] Phone: [phone]   Fax:  [phone]  Occupational Therapy Treatment  Patient Details  Name: [name] MRN: [id number] Date of Birth: [birth year] Referring Provider (OT): Sandrea Hughs, NP   Encounter Date: 08/24/2020   OT End of Session - 08/24/20 1502    Number of Visits 36    Date for OT Re-Evaluation 09/29/20    OT Start Time 0249    OT Stop Time 0410    OT Time Calculation (min) 81 min    Activity Tolerance Patient tolerated treatment well;No increased pain    Behavior During Therapy WFL for tasks assessed/performed           Past Medical History:  Diagnosis Date  . Anemia, unspecified   . Anxiety   . Arthritis   . Asthma   . Delusions ([place])   . Depression   . Edema 2005   "since put the glass in them"   . GERD (gastroesophageal reflux disease)   . Lumbago   . Lymphedema   . Obstructive chronic bronchitis without exacerbation ([place])   . Other abnormal glucose   . 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[place]) 04/04/2013  . Systolic heart failure (HCC)    EF 35 to 40% per echo January 2015  . Unspecified essential hypertension   . Unspecified nonpsychotic mental disorder   . Unspecified venous (peripheral) insufficiency   . Unspecified venous (peripheral) insufficiency   . Unspecified vitamin D deficiency     Past Surgical History:  Procedure Laterality Date  . APPENDECTOMY    . CERVICAL SPINE SURGERY     x 2  . CHOLECYSTECTOMY    . ESOPHAGOGASTRODUODENOSCOPY N/A 04/05/2015   Procedure: ESOPHAGOGASTRODUODENOSCOPY (EGD);  Surgeon: [name], MD;  Location: [facility];  Service: Endoscopy;  Laterality: N/A;  . FLEXIBLE SIGMOIDOSCOPY N/A 04/05/2015   Procedure: FLEXIBLE SIGMOIDOSCOPY;  Surgeon: [name], MD;  Location: [facility];  Service: Endoscopy;  Laterality: N/A;  . HAND SURGERY    . LUMBAR DISC SURGERY    . PARTIAL HYSTERECTOMY    .  TONSILLECTOMY      There were no vitals filed for this visit.   Subjective Assessment - 08/24/20 1458    Subjective  [name] presents for OT Rx visit 6/36 to address BLE lymphedma. Pt is accompanied by her nephew, [name] , today.    Patient is accompanied by: Family member    Pertinent History Chronic obstructive bronchitis, CVI, anxiety , systoloc heart failure, asthma, arthritis, multiple spinal surgeries w/ chronic back pain, DM,    Limitations Difficulty walking, impaired standing tolerance, impaired transfers, decreased joint AROM at toes, ankles and knees, chronic leg swelling , chronic pain. increased infection and falls risks, cognitive impairment, generaized weakness    Repetition Increases Symptoms    Special Tests + Stemmer bilaterally    Patient Stated Goals reduce swelling and legs feel better    Pain Onset Other (comment)   >20 years                       OT Treatments/Exercises (OP) - 08/24/20 0001      ADLs   ADL Education Given Yes      Manual Therapy   Manual Therapy Edema management;Compression Bandaging;Manual Lymphatic Drainage (MLD)    Manual therapy comments skin of L foot and distal leg flaking off in large sheets when removing cotton stockinett  that appears to have been in place for more than one day. Performed skin care throughout session to improve hydration and decrease infection risk    Manual Lymphatic Drainage (MLD) MLD to LLE as established. Pants worn to clinic severly limit access to inguinal LN      for proximal MLD.    Compression Bandaging LLE short stretch compression wraps using gradient techniques to the LLE from toes to popliteal fossa- as instructed. OT did not remove RLE comrpession wraps in clinic     due to time constraints. This is not typically done as CDT focuses on a single limb at a time to limit falls risk.                  OT Education - 08/24/20 1501    Education provided Yes    Education Details  Continued skilled Pt/caregiver education  And LE ADL training throughout visit for lymphedema self care/ home program, including compression wrapping, compression garment and device wear/care, lymphatic pumping ther ex, simple self-MLD, and skin care. Discussed progress towards goals.    Person(s) Educated Patient;Caregiver(s);Other (comment)    Methods Explanation;Demonstration;Handout    Comprehension Verbalized understanding;Returned demonstration;Need further instruction;Verbal cues required               OT Long Term Goals - 07/01/20 1618      OT LONG TERM GOAL #1   Title With maximum caregiver assistance Pt will be able to apply knee length, multi-layer, short stretch compression wraps daily from ankle to tibial tuberosity on one leg at a time using correct gradient techniques to return affected limb/s, as closely as possible, to premorbid size and shape, to limit leg pain and infection risk, and to improve safe functional mobility and ADLs performance.    Baseline dependent    Time 4    Period Days    Status New    Target Date --   6th OT Rx visit     OT LONG TERM GOAL #2   Title With Maximum caregiver assistance Pt will be able to verbalize signs and symptoms of cellulitis infection and identify lymphedema precautions using printed resource (modified independence) for reference to decrease infection risk and limit LE progression over time.    Baseline dependent    Time 6    Period Days    Status New    Target Date --   6th OT Rx visit     OT LONG TERM GOAL #3   Title With Maximum caregiver assistance Pt >85 % compliant with all daily, LE self-care protocols for home program, including simple self-manual lymphatic drainage (MLD), skin care, lymphatic pumping ther ex, skin care, and compression wraps and garments  to limit LE progression and further functional decline.    Baseline dependent    Time 6    Period Weeks    Status New    Target Date --   6th OT Rx visit     OT  LONG TERM GOAL #4   Baseline dependent    Time 6    Period Weeks    Status Partially Met      OT LONG TERM GOAL #5   Title Pt to tolerate compression wraps/ garments in keeping w/ prescribed daily wear regime within 1 week of issue date to progress and retain clinical and functional gains and to limit LE progression.    Baseline dependent    Time 6    Period Weeks    Status  New    Target Date --   6th OT Rx visit     Long Term Additional Goals   Additional Long Term Goals Yes      OT LONG TERM GOAL #6   Title Pt to achieve at least 10% LLE limb volume reduction bilaterally during modified Intensive Phase CDT to reduce infection risk, risk of recurrent leg wounds, to improve functional performance of basic and instrumental ADLs, and to limit LE progression.    Baseline dependent    Time 6    Period Weeks    Status New    Target Date --   6th OT Rx visit                Plan - 08/24/20 1612    Clinical Impression Statement Pt early for session today. Medial R leg with abraided area bleading very slightly. Nepgew to assist with cleaning wound and applying non stick covering w guaze. LLE swelling continues to decrease. Skin sloughing off in large sheets during MLD today. No LLE bleeding noted. Cont as per POC.    Occupational Profile and client history currently impacting functional performance dimensia, inconsistent CG assistance with removing wraps, providing skin care and reapplying compression between clinical visits. S    Occupational performance deficits (Please refer to evaluation for details): ADL's;IADL's;Rest and Sleep;Play;Leisure;Social Participation;Other   body image   Rehab Potential Fair    Clinical Decision Making Multiple treatment options, significant modification of task necessary    Comorbidities Affecting Occupational Performance: Presence of comorbidities impacting occupational performance    Modification or Assistance to Complete Evaluation  Max significant  modification of tasks or assist is necessary to complete    OT Frequency 1x / week    OT Duration 6 weeks   and PRN   OT Treatment/Interventions Self-care/ADL training;DME and/or AE instruction;Manual lymph drainage;Patient/family education;Compression bandaging;Therapeutic exercise;Manual Therapy;Other (comment)    Plan skin care to decrease infection risk and improve hydration    Recommended Other Services fit with adjustable , alternative compression wraps for optimal independence w/ donning and doffing. Cnsider CircAid with footpieces.    Consulted and Agree with Plan of Care Patient;Family member/caregiver           Patient will benefit from skilled therapeutic intervention in order to improve the following deficits and impairments:           Visit Diagnosis: Lymphedema, not elsewhere classified    Problem List Patient Active Problem List   Diagnosis Date Noted  . Medication noncompliance due to cognitive impairment 10/17/2019  . Chronic pain of multiple joints 08/01/2017  . Age-related osteoporosis without current pathological fracture 08/01/2017  . Left knee pain 09/19/2016  . Headache 06/22/2015  . Rectal benign neoplasm   . Asthma, chronic 04/04/2015  . history of Tubular adenoma of colon 04/04/2015  . 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 Hypokalemia 04/04/2015  . CKD (chronic kidney disease) stage 3, GFR 30-59 ml/min (HCC) 04/03/2015  . Dehydration 04/03/2015  . History of colonic polyps 12/08/2014  . Edema 04/14/2014  . Chronic systolic heart failure ([place]) 11/20/2013  . Nausea with vomiting 11/13/2013  . Lymphedema of lower extremity 10/17/2013  . 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[place]) 04/04/2013  . MGUS (monoclonal gammopathy of unknown significance) 05/15/2012  . Vitamin D deficiency 01/29/2010  . BURSITIS, LEFT ELBOW 01/29/2010  . DERMATITIS 09/30/2009  . DIABETES MELLITUS, BORDERLINE 01/11/2009  . Paranoid delusion ([place]) 08/02/2008  . Obstructive chronic  bronchitis ([place]) 08/02/2008  . Essential hypertension  11/11/2007  . Venous (peripheral) insufficiency 11/11/2007  . RENAL CALCULUS 11/11/2007  . LOW BACK PAIN, CHRONIC 11/11/2007    Andrey Spearman, MS, OTR/L, [facility] 08/24/20 4:15 PM  [place] [facility] SERVICES [address] [place], [place], [address] Phone: [phone]   Fax:  [phone]  Name: [name] MRN: [id number] Date of Birth: Aug 20, 1932

## 2020-08-31 ENCOUNTER — Ambulatory Visit: Payer: Medicare Other | Attending: Family | Admitting: Occupational Therapy

## 2020-08-31 ENCOUNTER — Other Ambulatory Visit: Payer: Self-pay

## 2020-08-31 DIAGNOSIS — I890 Lymphedema, not elsewhere classified: Secondary | ICD-10-CM | POA: Insufficient documentation

## 2020-08-31 NOTE — Therapy (Signed)
[place] MAIN [facility] SERVICES [address] [place], [place], [address] Phone: [phone]   Fax:  [phone]  Occupational Therapy Treatment  Patient Details  Name: [name] MRN: [id number] Date of Birth: 04-Feb-1932 Referring Provider (OT): Sandrea Hughs, NP   Encounter Date: 08/31/2020   OT End of Session - 08/31/20 1607    Visit Number 7    Number of Visits 36    Date for OT Re-Evaluation 09/29/20    OT Start Time 0255    OT Stop Time 0406    OT Time Calculation (min) 71 min    Activity Tolerance Patient tolerated treatment well;No increased pain    Behavior During Therapy WFL for tasks assessed/performed           Past Medical History:  Diagnosis Date  . Anemia, unspecified   . Anxiety   . Arthritis   . Asthma   . Delusions ([place])   . Depression   . Edema 2005   "since put the glass in them"   . GERD (gastroesophageal reflux disease)   . Lumbago   . Lymphedema   . Obstructive chronic bronchitis without exacerbation ([place])   . Other abnormal glucose   . 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 Systolic heart failure (HCC)    EF 35 to 40% per echo January 2015  . Unspecified essential hypertension   . Unspecified nonpsychotic mental disorder   . Unspecified venous (peripheral) insufficiency   . Unspecified venous (peripheral) insufficiency   . Unspecified vitamin D deficiency     Past Surgical History:  Procedure Laterality Date  . APPENDECTOMY    . CERVICAL SPINE SURGERY     x 2  . CHOLECYSTECTOMY    . ESOPHAGOGASTRODUODENOSCOPY N/A 04/05/2015   Procedure: ESOPHAGOGASTRODUODENOSCOPY (EGD);  Surgeon: [name], MD;  Location: [facility];  Service: Endoscopy;  Laterality: N/A;  . FLEXIBLE SIGMOIDOSCOPY N/A 04/05/2015   Procedure: FLEXIBLE SIGMOIDOSCOPY;  Surgeon: [name], MD;  Location: [facility];  Service: Endoscopy;  Laterality: N/A;  . HAND SURGERY    . LUMBAR DISC SURGERY    . PARTIAL  HYSTERECTOMY    . TONSILLECTOMY      There were no vitals filed for this visit.   Subjective Assessment - 08/31/20 1606    Subjective  [name] presents for OT Rx visit 7/36 to address BLE lymphedma. Pt is accompanied by her nephew, [name]. Pt denies leg pain today.    Patient is accompanied by: Family member    Pertinent History Chronic obstructive bronchitis, CVI, anxiety , systoloc heart failure, asthma, arthritis, multiple spinal surgeries w/ chronic back pain, DM,    Limitations Difficulty walking, impaired standing tolerance, impaired transfers, decreased joint AROM at toes, ankles and knees, chronic leg swelling , chronic pain. increased infection and falls risks, cognitive impairment, generaized weakness    Repetition Increases Symptoms    Special Tests + Stemmer bilaterally    Patient Stated Goals reduce swelling and legs feel better    Pain Onset Other (comment)   >20 years                       OT Treatments/Exercises (OP) - 08/31/20 0001      ADLs   ADL Education Given Yes      Manual Therapy   Manual Therapy Edema management;Compression Bandaging;Manual Lymphatic Drainage (MLD)    Manual therapy comments skin of L foot and distal leg  flaking off in large sheets when removing cotton stockinett that appears to have been in place for more than one day. Performed skin care throughout session to improve hydration and decrease infection risk    Manual Lymphatic Drainage (MLD) MLD to LLE as established. Pants worn to clinic severly limit access to inguinal LN      for proximal MLD.    Compression Bandaging LLE short stretch compression wraps using gradient techniques to the LLE from toes to popliteal fossa- as instructed. OT did not remove RLE comrpession wraps in clinic     due to time constraints. This is not typically done as CDT focuses on a single limb at a time to limit falls risk.                  OT Education - 08/31/20 1606    Education  provided Yes    Education Details Continued skilled Pt/caregiver education  And LE ADL training throughout visit for lymphedema self care/ home program, including compression wrapping, compression garment and device wear/care, lymphatic pumping ther ex, simple self-MLD, and skin care. Discussed progress towards goals.    Person(s) Educated Patient;Caregiver(s);Other (comment)    Methods Explanation;Demonstration;Handout    Comprehension Verbalized understanding;Returned demonstration;Need further instruction;Verbal cues required               OT Long Term Goals - 07/01/20 1618      OT LONG TERM GOAL #1   Title With maximum caregiver assistance Pt will be able to apply knee length, multi-layer, short stretch compression wraps daily from ankle to tibial tuberosity on one leg at a time using correct gradient techniques to return affected limb/s, as closely as possible, to premorbid size and shape, to limit leg pain and infection risk, and to improve safe functional mobility and ADLs performance.    Baseline dependent    Time 4    Period Days    Status New    Target Date --   6th OT Rx visit     OT LONG TERM GOAL #2   Title With Maximum caregiver assistance Pt will be able to verbalize signs and symptoms of cellulitis infection and identify lymphedema precautions using printed resource (modified independence) for reference to decrease infection risk and limit LE progression over time.    Baseline dependent    Time 6    Period Days    Status New    Target Date --   6th OT Rx visit     OT LONG TERM GOAL #3   Title With Maximum caregiver assistance Pt >85 % compliant with all daily, LE self-care protocols for home program, including simple self-manual lymphatic drainage (MLD), skin care, lymphatic pumping ther ex, skin care, and compression wraps and garments  to limit LE progression and further functional decline.    Baseline dependent    Time 6    Period Weeks    Status New    Target  Date --   6th OT Rx visit     OT LONG TERM GOAL #4   Baseline dependent    Time 6    Period Weeks    Status Partially Met      OT LONG TERM GOAL #5   Title Pt to tolerate compression wraps/ garments in keeping w/ prescribed daily wear regime within 1 week of issue date to progress and retain clinical and functional gains and to limit LE progression.    Baseline dependent    Time 6  Period Weeks    Status New    Target Date --   6th OT Rx visit     Long Term Additional Goals   Additional Long Term Goals Yes      OT LONG TERM GOAL #6   Title Pt to achieve at least 10% LLE limb volume reduction bilaterally during modified Intensive Phase CDT to reduce infection risk, risk of recurrent leg wounds, to improve functional performance of basic and instrumental ADLs, and to limit LE progression.    Baseline dependent    Time 6    Period Weeks    Status New    Target Date --   6th OT Rx visit                Plan - 08/31/20 1607    Clinical Impression Statement Pt early for session again today. Medial R leg with abraided area bleading very slightly again today. Reinterated need to cover with non stick pad. Provided 2 pieces of Stretch net to hold Telfa pads in place n abraided skin without tape or guaze. Skin of LLE continues to slough off in large, thickened sheets during MLD today. No LLE bleeding noted. Pt tolerating all aspects of CDT without increased pain. Sweelling has decreased noticably. Fibrosis is changed very little. Skin condition is very dry and with fibrotic changes typical of stage III lymphedema .Cont as per POC.    Occupational Profile and client history currently impacting functional performance dimensia, inconsistent CG assistance with removing wraps, providing skin care and reapplying compression between clinical visits. S    Occupational performance deficits (Please refer to evaluation for details): ADL's;IADL's;Rest and Sleep;Play;Leisure;Social  Participation;Other   body image   Rehab Potential Fair    Clinical Decision Making Multiple treatment options, significant modification of task necessary    Comorbidities Affecting Occupational Performance: Presence of comorbidities impacting occupational performance    Modification or Assistance to Complete Evaluation  Max significant modification of tasks or assist is necessary to complete    OT Frequency 1x / week    OT Duration 6 weeks   and PRN   OT Treatment/Interventions Self-care/ADL training;DME and/or AE instruction;Manual lymph drainage;Patient/family education;Compression bandaging;Therapeutic exercise;Manual Therapy;Other (comment)    Plan skin care to decrease infection risk and improve hydration    Recommended Other Services fit with adjustable , alternative compression wraps for optimal independence w/ donning and doffing. Cnsider CircAid with footpieces.    Consulted and Agree with Plan of Care Patient;Family member/caregiver           Patient will benefit from skilled therapeutic intervention in order to improve the following deficits and impairments:           Visit Diagnosis: Lymphedema, not elsewhere classified    Problem List Patient Active Problem List   Diagnosis Date Noted  . Medication noncompliance due to cognitive impairment 10/17/2019  . Chronic pain of multiple joints 08/01/2017  . Age-related osteoporosis without current pathological fracture 08/01/2017  . Left knee pain 09/19/2016  . Headache 06/22/2015  . Rectal benign neoplasm   . Asthma, chronic 04/04/2015  . history of Tubular adenoma of colon 04/04/2015  . 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 Hypokalemia 04/04/2015  . CKD (chronic kidney disease) stage 3, GFR 30-59 ml/min (HCC) 04/03/2015  . Dehydration 04/03/2015  . History of colonic polyps 12/08/2014  . Edema 04/14/2014  . Chronic systolic heart failure ([place]) 11/20/2013  . Nausea with vomiting 11/13/2013  . Lymphedema of  lower extremity  10/17/2013  . 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 MGUS (monoclonal gammopathy of unknown significance) 05/15/2012  . Vitamin D deficiency 01/29/2010  . BURSITIS, LEFT ELBOW 01/29/2010  . DERMATITIS 09/30/2009  . DIABETES MELLITUS, BORDERLINE 01/11/2009  . Paranoid delusion ([place]) 08/02/2008  . Obstructive chronic bronchitis ([place]) 08/02/2008  . Essential hypertension 11/11/2007  . Venous (peripheral) insufficiency 11/11/2007  . RENAL CALCULUS 11/11/2007  . LOW BACK PAIN, CHRONIC 11/11/2007    Andrey Spearman, MS, OTR/L, [facility] 08/31/20 4:11 PM  [place] MAIN [facility] SERVICES [address] [place], [place], [address] Phone: [phone]   Fax:  [phone]  Name: [name] MRN: [id number] Date of Birth: [birth year]

## 2020-09-07 ENCOUNTER — Ambulatory Visit: Payer: Medicare Other | Admitting: Occupational Therapy

## 2020-09-07 ENCOUNTER — Other Ambulatory Visit: Payer: Self-pay

## 2020-09-07 DIAGNOSIS — I890 Lymphedema, not elsewhere classified: Secondary | ICD-10-CM | POA: Diagnosis not present

## 2020-09-07 NOTE — Therapy (Signed)
[place] MAIN [facility] SERVICES [address] [place], [place], [address] Phone: [phone]   Fax:  [phone]  Occupational Therapy Treatment  Patient Details  Name: [name] MRN: [id number] Date of Birth: 1932/01/21 Referring Provider (OT): Sandrea Hughs, NP   Encounter Date: 09/07/2020   OT End of Session - 09/07/20 1543    Visit Number 8    Number of Visits 36    Date for OT Re-Evaluation 09/29/20    OT Start Time 0227    OT Stop Time 0340    OT Time Calculation (min) 73 min    Activity Tolerance Patient tolerated treatment well;No increased pain    Behavior During Therapy WFL for tasks assessed/performed           Past Medical History:  Diagnosis Date  . Anemia, unspecified   . Anxiety   . Arthritis   . Asthma   . Delusions ([place])   . Depression   . Edema 2005   "since put the glass in them"   . GERD (gastroesophageal reflux disease)   . Lumbago   . Lymphedema   . Obstructive chronic bronchitis without exacerbation ([place])   . Other abnormal glucose   . 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 Systolic heart failure (HCC)    EF 35 to 40% per echo January 2015  . Unspecified essential hypertension   . Unspecified nonpsychotic mental disorder   . Unspecified venous (peripheral) insufficiency   . Unspecified venous (peripheral) insufficiency   . Unspecified vitamin D deficiency     Past Surgical History:  Procedure Laterality Date  . APPENDECTOMY    . CERVICAL SPINE SURGERY     x 2  . CHOLECYSTECTOMY    . ESOPHAGOGASTRODUODENOSCOPY N/A 04/05/2015   Procedure: ESOPHAGOGASTRODUODENOSCOPY (EGD);  Surgeon: [name], MD;  Location: [facility];  Service: Endoscopy;  Laterality: N/A;  . FLEXIBLE SIGMOIDOSCOPY N/A 04/05/2015   Procedure: FLEXIBLE SIGMOIDOSCOPY;  Surgeon: [name], MD;  Location: [facility];  Service: Endoscopy;  Laterality: N/A;  . HAND SURGERY    . LUMBAR DISC SURGERY    . PARTIAL  HYSTERECTOMY    . TONSILLECTOMY      There were no vitals filed for this visit.   Subjective Assessment - 09/07/20 1444    Subjective  [name] presents for OT Rx visit 7/36 to address BLE lymphedma. Pt is accompanied by her nephew, [name]. Pt denies leg pain today. Pt's nephew tells me Pt has not had compression wraps for 2 days.    Patient is accompanied by: Family member    Pertinent History Chronic obstructive bronchitis, CVI, anxiety , systoloc heart failure, asthma, arthritis, multiple spinal surgeries w/ chronic back pain, DM,    Limitations Difficulty walking, impaired standing tolerance, impaired transfers, decreased joint AROM at toes, ankles and knees, chronic leg swelling , chronic pain. increased infection and falls risks, cognitive impairment, generaized weakness    Repetition Increases Symptoms    Special Tests + Stemmer bilaterally    Patient Stated Goals reduce swelling and legs feel better    Pain Onset Other (comment)   >20 years                       OT Treatments/Exercises (OP) - 09/07/20 0001      ADLs   ADL Education Given Yes      Manual Therapy   Manual Therapy Edema management;Compression Bandaging;Manual Lymphatic Drainage (MLD)  Manual therapy comments skin of L foot and distal leg flaking off in large sheets when removing cotton stockinett that appears to have been in place for more than one day. Performed skin care throughout session to improve hydration and decrease infection risk    Manual Lymphatic Drainage (MLD) MLD to LLE as established. Pants worn to clinic severly limit access to inguinal LN      for proximal MLD.    Compression Bandaging LLE short stretch compression wraps using gradient techniques to the LLE from toes to popliteal fossa- as instructed. OT did not remove RLE comrpession wraps in clinic     due to time constraints. This is not typically done as CDT focuses on a single limb at a time to limit falls risk.                    OT Education - 09/07/20 1542    Education provided Yes    Education Details Continued skilled Pt/caregiver education  And LE ADL training throughout visit for lymphedema self care/ home program, including compression wrapping, compression garment and device wear/care, lymphatic pumping ther ex, simple self-MLD, and skin care. Discussed progress towards goals.    Person(s) Educated Patient;Caregiver(s);Other (comment)    Methods Explanation;Demonstration;Handout    Comprehension Verbalized understanding;Returned demonstration;Need further instruction;Verbal cues required               OT Long Term Goals - 07/01/20 1618      OT LONG TERM GOAL #1   Title With maximum caregiver assistance Pt will be able to apply knee length, multi-layer, short stretch compression wraps daily from ankle to tibial tuberosity on one leg at a time using correct gradient techniques to return affected limb/s, as closely as possible, to premorbid size and shape, to limit leg pain and infection risk, and to improve safe functional mobility and ADLs performance.    Baseline dependent    Time 4    Period Days    Status New    Target Date --   6th OT Rx visit     OT LONG TERM GOAL #2   Title With Maximum caregiver assistance Pt will be able to verbalize signs and symptoms of cellulitis infection and identify lymphedema precautions using printed resource (modified independence) for reference to decrease infection risk and limit LE progression over time.    Baseline dependent    Time 6    Period Days    Status New    Target Date --   6th OT Rx visit     OT LONG TERM GOAL #3   Title With Maximum caregiver assistance Pt >85 % compliant with all daily, LE self-care protocols for home program, including simple self-manual lymphatic drainage (MLD), skin care, lymphatic pumping ther ex, skin care, and compression wraps and garments  to limit LE progression and further functional decline.    Baseline  dependent    Time 6    Period Weeks    Status New    Target Date --   6th OT Rx visit     OT LONG TERM GOAL #4   Baseline dependent    Time 6    Period Weeks    Status Partially Met      OT LONG TERM GOAL #5   Title Pt to tolerate compression wraps/ garments in keeping w/ prescribed daily wear regime within 1 week of issue date to progress and retain clinical and functional gains and to limit LE  progression.    Baseline dependent    Time 6    Period Weeks    Status New    Target Date --   6th OT Rx visit     Long Term Additional Goals   Additional Long Term Goals Yes      OT LONG TERM GOAL #6   Title Pt to achieve at least 10% LLE limb volume reduction bilaterally during modified Intensive Phase CDT to reduce infection risk, risk of recurrent leg wounds, to improve functional performance of basic and instrumental ADLs, and to limit LE progression.    Baseline dependent    Time 6    Period Weeks    Status New    Target Date --   6th OT Rx visit                Plan - 09/07/20 1543    Clinical Impression Statement LLE presenting with increased swelling and tissue density, and sever dryness with large sheets and flakes of skin coming off with gentle rubbing with dry washcloth. Pt admits that she has not applied lotion to her legs for several days. Nephew reports Pt has not had compression in place for past 2 days. Pt lives alone without caregivers living close enough to assist with daily LE home program. This has been, and continues to be , the greatest obstacle to improvement ibn condition for this patient. Pt tolerated MLD, skin care and multillayer compression wraps as established to LLE. Cont as per POC.    Occupational Profile and client history currently impacting functional performance dimensia, inconsistent CG assistance with removing wraps, providing skin care and reapplying compression between clinical visits. S    Occupational performance deficits (Please refer to  evaluation for details): ADL's;IADL's;Rest and Sleep;Play;Leisure;Social Participation;Other   body image   Rehab Potential Fair    Clinical Decision Making Multiple treatment options, significant modification of task necessary    Comorbidities Affecting Occupational Performance: Presence of comorbidities impacting occupational performance    Modification or Assistance to Complete Evaluation  Max significant modification of tasks or assist is necessary to complete    OT Frequency 1x / week    OT Duration 6 weeks   and PRN   OT Treatment/Interventions Self-care/ADL training;DME and/or AE instruction;Manual lymph drainage;Patient/family education;Compression bandaging;Therapeutic exercise;Manual Therapy;Other (comment)    Plan skin care to decrease infection risk and improve hydration    Recommended Other Services fit with adjustable , alternative compression wraps for optimal independence w/ donning and doffing. Cnsider CircAid with footpieces.    Consulted and Agree with Plan of Care Patient;Family member/caregiver           Patient will benefit from skilled therapeutic intervention in order to improve the following deficits and impairments:           Visit Diagnosis: Lymphedema, not elsewhere classified    Problem List Patient Active Problem List   Diagnosis Date Noted  . Medication noncompliance due to cognitive impairment 10/17/2019  . Chronic pain of multiple joints 08/01/2017  . Age-related osteoporosis without current pathological fracture 08/01/2017  . Left knee pain 09/19/2016  . Headache 06/22/2015  . Rectal benign neoplasm   . Asthma, chronic 04/04/2015  . history of Tubular adenoma of colon 04/04/2015  . 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 Hypokalemia 04/04/2015  . CKD (chronic kidney disease) stage 3, GFR 30-59 ml/min (HCC) 04/03/2015  . Dehydration 04/03/2015  . History of colonic polyps 12/08/2014  . Edema 04/14/2014  .  Chronic systolic heart  failure ([place]) 11/20/2013  . Nausea with vomiting 11/13/2013  . Lymphedema of lower extremity 10/17/2013  . 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 MGUS (monoclonal gammopathy of unknown significance) 05/15/2012  . Vitamin D deficiency 01/29/2010  . BURSITIS, LEFT ELBOW 01/29/2010  . DERMATITIS 09/30/2009  . DIABETES MELLITUS, BORDERLINE 01/11/2009  . Paranoid delusion ([place]) 08/02/2008  . Obstructive chronic bronchitis ([place]) 08/02/2008  . Essential hypertension 11/11/2007  . Venous (peripheral) insufficiency 11/11/2007  . RENAL CALCULUS 11/11/2007  . LOW BACK PAIN, CHRONIC 11/11/2007    Andrey Spearman, MS, OTR/L, [facility] 09/07/20 3:47 PM  [place] MAIN [facility] SERVICES [address] [place], [place], [address] Phone: [phone]   Fax:  [phone]  Name: [name] MRN: [id number] Date of Birth: Feb 22, 1932

## 2020-09-14 ENCOUNTER — Ambulatory Visit: Payer: Medicare Other | Admitting: Occupational Therapy

## 2020-09-14 ENCOUNTER — Other Ambulatory Visit: Payer: Self-pay

## 2020-09-14 DIAGNOSIS — I890 Lymphedema, not elsewhere classified: Secondary | ICD-10-CM

## 2020-09-14 NOTE — Therapy (Signed)
[place] MAIN [facility] SERVICES [address] [place], [place], [address] Phone: [phone]   Fax:  [phone]  Occupational Therapy Treatment  Patient Details  Name: [name] MRN: [id number] Date of Birth: Nov 24, 1931 Referring Provider (OT): Sandrea Hughs, NP   Encounter Date: 09/14/2020   OT End of Session - 09/14/20 1509    Visit Number 9    Number of Visits 36    Date for OT Re-Evaluation 09/29/20    OT Start Time 0304    Activity Tolerance Patient tolerated treatment well;No increased pain    Behavior During Therapy WFL for tasks assessed/performed           Past Medical History:  Diagnosis Date  . Anemia, unspecified   . Anxiety   . Arthritis   . Asthma   . Delusions ([place])   . Depression   . Edema 2005   "since put the glass in them"   . GERD (gastroesophageal reflux disease)   . Lumbago   . Lymphedema   . Obstructive chronic bronchitis without exacerbation ([place])   . Other abnormal glucose   . 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[place]) 04/04/2013  . Systolic heart failure (HCC)    EF 35 to 40% per echo January 2015  . Unspecified essential hypertension   . Unspecified nonpsychotic mental disorder   . Unspecified venous (peripheral) insufficiency   . Unspecified venous (peripheral) insufficiency   . Unspecified vitamin D deficiency     Past Surgical History:  Procedure Laterality Date  . APPENDECTOMY    . CERVICAL SPINE SURGERY     x 2  . CHOLECYSTECTOMY    . ESOPHAGOGASTRODUODENOSCOPY N/A 04/05/2015   Procedure: ESOPHAGOGASTRODUODENOSCOPY (EGD);  Surgeon: [name], MD;  Location: [facility];  Service: Endoscopy;  Laterality: N/A;  . FLEXIBLE SIGMOIDOSCOPY N/A 04/05/2015   Procedure: FLEXIBLE SIGMOIDOSCOPY;  Surgeon: [name], MD;  Location: [facility];  Service: Endoscopy;  Laterality: N/A;  . HAND SURGERY    . LUMBAR DISC SURGERY    . PARTIAL HYSTERECTOMY    . TONSILLECTOMY      There were no vitals  filed for this visit.   Subjective Assessment - 09/14/20 1507    Subjective  [name] presents for OT Rx visit 9/36 to address BLE lymphedma. Pt is accompanied by her nephew, [name]. Pt denies LE related leg pain today. Pt states last time she had compression wraps on her legs was yesterday.Pt declines volumetric measurements today in prep for progress report next visit. Family member agrees to bring adjustable leg wraps next visit.    Patient is accompanied by: Family member    Pertinent History Chronic obstructive bronchitis, CVI, anxiety , systoloc heart failure, asthma, arthritis, multiple spinal surgeries w/ chronic back pain, DM,    Limitations Difficulty walking, impaired standing tolerance, impaired transfers, decreased joint AROM at toes, ankles and knees, chronic leg swelling , chronic pain. increased infection and falls risks, cognitive impairment, generaized weakness    Repetition Increases Symptoms    Special Tests + Stemmer bilaterally    Patient Stated Goals reduce swelling and legs feel better    Pain Onset Other (comment)   >20 years                       OT Treatments/Exercises (OP) - 09/14/20 0001      ADLs   ADL Education Given Yes      Manual Therapy   Manual  Therapy Edema management;Compression Bandaging;Manual Lymphatic Drainage (MLD)    Manual therapy comments skin of L foot and distal leg flaking off in large sheets when removing cotton stockinett that appears to have been in place for more than one day. Performed skin care throughout session to improve hydration and decrease infection risk    Manual Lymphatic Drainage (MLD) MLD to LLE as established. Pants worn to clinic severly limit access to inguinal LN      for proximal MLD.    Compression Bandaging LLE short stretch compression wraps using gradient techniques to the LLE from toes to popliteal fossa- as instructed. OT did not remove RLE comrpession wraps in clinic     due to time constraints.  This is not typically done as CDT focuses on a single limb at a time to limit falls risk.                  OT Education - 09/14/20 1509    Education provided Yes    Education Details Continued skilled Pt/caregiver education  And LE ADL training throughout visit for lymphedema self care/ home program, including compression wrapping, compression garment and device wear/care, lymphatic pumping ther ex, simple self-MLD, and skin care. Discussed progress towards goals.    Person(s) Educated Patient;Caregiver(s);Other (comment)    Methods Explanation;Demonstration;Handout    Comprehension Verbalized understanding;Returned demonstration;Need further instruction;Verbal cues required               OT Long Term Goals - 07/01/20 1618      OT LONG TERM GOAL #1   Title With maximum caregiver assistance Pt will be able to apply knee length, multi-layer, short stretch compression wraps daily from ankle to tibial tuberosity on one leg at a time using correct gradient techniques to return affected limb/s, as closely as possible, to premorbid size and shape, to limit leg pain and infection risk, and to improve safe functional mobility and ADLs performance.    Baseline dependent    Time 4    Period Days    Status New    Target Date --   6th OT Rx visit     OT LONG TERM GOAL #2   Title With Maximum caregiver assistance Pt will be able to verbalize signs and symptoms of cellulitis infection and identify lymphedema precautions using printed resource (modified independence) for reference to decrease infection risk and limit LE progression over time.    Baseline dependent    Time 6    Period Days    Status New    Target Date --   6th OT Rx visit     OT LONG TERM GOAL #3   Title With Maximum caregiver assistance Pt >85 % compliant with all daily, LE self-care protocols for home program, including simple self-manual lymphatic drainage (MLD), skin care, lymphatic pumping ther ex, skin care, and  compression wraps and garments  to limit LE progression and further functional decline.    Baseline dependent    Time 6    Period Weeks    Status New    Target Date --   6th OT Rx visit     OT LONG TERM GOAL #4   Baseline dependent    Time 6    Period Weeks    Status Partially Met      OT LONG TERM GOAL #5   Title Pt to tolerate compression wraps/ garments in keeping w/ prescribed daily wear regime within 1 week of issue date to progress and  retain clinical and functional gains and to limit LE progression.    Baseline dependent    Time 6    Period Weeks    Status New    Target Date --   6th OT Rx visit     Long Term Additional Goals   Additional Long Term Goals Yes      OT LONG TERM GOAL #6   Title Pt to achieve at least 10% LLE limb volume reduction bilaterally during modified Intensive Phase CDT to reduce infection risk, risk of recurrent leg wounds, to improve functional performance of basic and instrumental ADLs, and to limit LE progression.    Baseline dependent    Time 6    Period Weeks    Status New    Target Date --   6th OT Rx visit                Plan - 09/14/20 1559    Clinical Impression Statement Pt on time again today. Late afternoon appointment time has been working very well for Pt's nephew. Pt's legs remain severely dry with flaking skin , L>R, but hydration slightly better today. Pt encouraged to continue to apply lotion daily when possible. Pt able to tolerate MLD, skin care and gradient compression wraps without increased pain. Dime sized area at anterior L ankle appears with dried blood on abraided skin presenting increased infection risk. Pt retaught importance of constant skin care to limit infection risk. Next vivit we'll complete comparative limb volumetrics foir progress report, and attempt to fit existing adjustable CircAid knee high, wrap style daytime garments.    Occupational Profile and client history currently impacting functional performance  dimensia, inconsistent CG assistance with removing wraps, providing skin care and reapplying compression between clinical visits. S    Occupational performance deficits (Please refer to evaluation for details): ADL's;IADL's;Rest and Sleep;Play;Leisure;Social Participation;Other   body image   Rehab Potential Fair    Clinical Decision Making Multiple treatment options, significant modification of task necessary    Comorbidities Affecting Occupational Performance: Presence of comorbidities impacting occupational performance    Modification or Assistance to Complete Evaluation  Max significant modification of tasks or assist is necessary to complete    OT Frequency 1x / week    OT Duration 6 weeks   and PRN   OT Treatment/Interventions Self-care/ADL training;DME and/or AE instruction;Manual lymph drainage;Patient/family education;Compression bandaging;Therapeutic exercise;Manual Therapy;Other (comment)    Plan skin care to decrease infection risk and improve hydration    Recommended Other Services fit with adjustable , alternative compression wraps for optimal independence w/ donning and doffing. Cnsider CircAid with footpieces.    Consulted and Agree with Plan of Care Patient;Family member/caregiver           Patient will benefit from skilled therapeutic intervention in order to improve the following deficits and impairments:           Visit Diagnosis: Lymphedema, not elsewhere classified    Problem List Patient Active Problem List   Diagnosis Date Noted  . Medication noncompliance due to cognitive impairment 10/17/2019  . Chronic pain of multiple joints 08/01/2017  . Age-related osteoporosis without current pathological fracture 08/01/2017  . Left knee pain 09/19/2016  . Headache 06/22/2015  . Rectal benign neoplasm   . Asthma, chronic 04/04/2015  . history of Tubular adenoma of colon 04/04/2015  . 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 Hypokalemia 04/04/2015  . CKD  (chronic kidney disease) stage 3, GFR 30-59 ml/min (HCC) 04/03/2015  .  Dehydration 04/03/2015  . History of colonic polyps 12/08/2014  . Edema 04/14/2014  . Chronic systolic heart failure ([place]) 11/20/2013  . Nausea with vomiting 11/13/2013  . Lymphedema of lower extremity 10/17/2013  . 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 MGUS (monoclonal gammopathy of unknown significance) 05/15/2012  . Vitamin D deficiency 01/29/2010  . BURSITIS, LEFT ELBOW 01/29/2010  . DERMATITIS 09/30/2009  . DIABETES MELLITUS, BORDERLINE 01/11/2009  . Paranoid delusion ([place]) 08/02/2008  . Obstructive chronic bronchitis ([place]) 08/02/2008  . Essential hypertension 11/11/2007  . Venous (peripheral) insufficiency 11/11/2007  . RENAL CALCULUS 11/11/2007  . LOW BACK PAIN, CHRONIC 11/11/2007    Andrey Spearman, MS, OTR/L, [facility] 09/14/20 4:05 PM  [place] MAIN [facility] SERVICES [address] [place], [place], [address] Phone: [phone]   Fax:  [phone]  Name: [name] MRN: [id number] Date of Birth: 07/19/1932

## 2020-09-17 ENCOUNTER — Ambulatory Visit: Payer: Medicare Other | Admitting: Podiatry

## 2020-09-21 ENCOUNTER — Ambulatory Visit: Payer: Medicare Other | Admitting: Occupational Therapy

## 2020-09-21 ENCOUNTER — Other Ambulatory Visit: Payer: Self-pay

## 2020-09-21 DIAGNOSIS — I890 Lymphedema, not elsewhere classified: Secondary | ICD-10-CM | POA: Diagnosis not present

## 2020-09-21 NOTE — Therapy (Signed)
[place] MAIN [facility] SERVICES [address] [place], [place], [address] Phone: [phone]   Fax:  [phone]  Occupational Therapy Treatment  Patient Details  Name: [name] MRN: [id number] Date of Birth: [birth year] Referring Provider (OT): Sandrea Hughs, NP   Encounter Date: 09/21/2020   OT End of Session - 09/21/20 1719    Visit Number 10    Number of Visits 36    Date for OT Re-Evaluation 09/29/20    OT Start Time 0310    OT Stop Time 0414    OT Time Calculation (min) 64 min    Activity Tolerance Patient tolerated treatment well;No increased pain    Behavior During Therapy WFL for tasks assessed/performed           Past Medical History:  Diagnosis Date  . Anemia, unspecified   . Anxiety   . Arthritis   . Asthma   . Delusions ([place])   . Depression   . Edema 2005   "since put the glass in them"   . GERD (gastroesophageal reflux disease)   . Lumbago   . Lymphedema   . Obstructive chronic bronchitis without exacerbation ([place])   . Other abnormal glucose   . 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 Systolic heart failure (HCC)    EF 35 to 40% per echo January 2015  . Unspecified essential hypertension   . Unspecified nonpsychotic mental disorder   . Unspecified venous (peripheral) insufficiency   . Unspecified venous (peripheral) insufficiency   . Unspecified vitamin D deficiency     Past Surgical History:  Procedure Laterality Date  . APPENDECTOMY    . CERVICAL SPINE SURGERY     x 2  . CHOLECYSTECTOMY    . ESOPHAGOGASTRODUODENOSCOPY N/A 04/05/2015   Procedure: ESOPHAGOGASTRODUODENOSCOPY (EGD);  Surgeon: [name], MD;  Location: [facility];  Service: Endoscopy;  Laterality: N/A;  . FLEXIBLE SIGMOIDOSCOPY N/A 04/05/2015   Procedure: FLEXIBLE SIGMOIDOSCOPY;  Surgeon: [name], MD;  Location: [facility];  Service: Endoscopy;  Laterality: N/A;  . HAND SURGERY    . LUMBAR DISC SURGERY    . PARTIAL  HYSTERECTOMY    . TONSILLECTOMY      There were no vitals filed for this visit.   Subjective Assessment - 09/21/20 1715    Subjective  ELAIZA SHOBERG presents for OT Rx visit 10/36 to address BLE lymphedma. Pt is accompanied by her nephew, [name]. Pt denies LE related leg pain today. BLE presentiung with small areas of blood on anterior ankles. Appears as if Pt scrubbed skin too hard when bathig causing layers of skin to flake off causing slight tearing underneath.    Patient is accompanied by: Family member    Pertinent History Chronic obstructive bronchitis, CVI, anxiety , systoloc heart failure, asthma, arthritis, multiple spinal surgeries w/ chronic back pain, DM,    Limitations Difficulty walking, impaired standing tolerance, impaired transfers, decreased joint AROM at toes, ankles and knees, chronic leg swelling , chronic pain. increased infection and falls risks, cognitive impairment, generaized weakness    Repetition Increases Symptoms    Special Tests + Stemmer bilaterally    Patient Stated Goals reduce swelling and legs feel better    Pain Onset Other (comment)   >20 years                       OT Treatments/Exercises (OP) - 09/21/20 0001      ADLs   ADL  Education Given Yes      Manual Therapy   Manual Therapy Edema management;Compression Bandaging    Compression Bandaging LLE fitted with CircAid adjustable leg wrap. Unable to apply foot wraps as necessary materials unavailable today.                  OT Education - 09/21/20 1718    Education provided Yes    Education Details Pt and nephew educated on donning CircAid legging and adjusting compression using paper guide from manufacturer    Person(s) Educated Patient;Caregiver(s);Other (comment)    Methods Explanation;Demonstration;Handout    Comprehension Verbalized understanding;Returned demonstration;Need further instruction;Verbal cues required               OT Long Term Goals - 09/21/20  1725      OT LONG TERM GOAL #1   Title With maximum caregiver assistance Pt will be able to apply knee length, multi-layer, short stretch compression wraps daily from ankle to tibial tuberosity on one leg at a time using correct gradient techniques to return affected limb/s, as closely as possible, to premorbid size and shape, to limit leg pain and infection risk, and to improve safe functional mobility and ADLs performance.    Baseline dependent    Time 4    Period Days    Status Partially Met      OT LONG TERM GOAL #2   Title With Maximum caregiver assistance Pt will be able to verbalize signs and symptoms of cellulitis infection and identify lymphedema precautions using printed resource (modified independence) for reference to decrease infection risk and limit LE progression over time.    Baseline dependent    Time 6    Period Days    Status Partially Met      OT LONG TERM GOAL #3   Title With Maximum caregiver assistance Pt >85 % compliant with all daily, LE self-care protocols for home program, including simple self-manual lymphatic drainage (MLD), skin care, lymphatic pumping ther ex, skin care, and compression wraps and garments  to limit LE progression and further functional decline.    Baseline dependent    Time 6    Period Weeks    Status Partially Met      OT LONG TERM GOAL #4   Baseline dependent    Time 6    Period Weeks    Status Partially Met      OT LONG TERM GOAL #5   Title Pt to tolerate compression wraps/ garments in keeping w/ prescribed daily wear regime within 1 week of issue date to progress and retain clinical and functional gains and to limit LE progression.    Baseline dependent    Time 6    Period Weeks    Status Partially Met      OT LONG TERM GOAL #6   Title Pt to achieve at least 10% LLE limb volume reduction bilaterally during modified Intensive Phase CDT to reduce infection risk, risk of recurrent leg wounds, to improve functional performance of  basic and instrumental ADLs, and to limit LE progression.    Baseline dependent    Time 6    Period Weeks    Status Partially Met                 Plan - 09/21/20 1719    Clinical Impression Statement Skin at anterior ankles is bleeding slightly and poses increased infection risk. Pt encouraged to wash skin gently using soft wash cloth , and not  to scratch itching skin with tough nails. Emphasis of session on fitting CircAid reduction kit for LLE and trying a variety of sample foot pieces. Pt will triaL cIRCaID ADJUSTABLE, vELCRO-STAYLE WRAP OVER THE LONG WEEKEND WITH NEPHEW WRAPPING FOOT AND ANKLE WITH FOAM AND 1 SS WRAP, AS NONE OF THE FOOT PIECES AVAILABLE WERE APPROPRIATE OT COMFORTABLE. Pt instructed to remove CircAid tfor sleep, and instructed to don again first thing upon rising. Pt / nephew instructed to resume compressin wraps if increased swelling and/ or discomfort id noted. Cont as per POC. Pt demonstrates steady progress towards all OT goals for LLE thus far. Will assess limb volumetrics next visit. See Long Term Goals section for detailed progress todate.    Occupational Profile and client history currently impacting functional performance dimensia, inconsistent CG assistance with removing wraps, providing skin care and reapplying compression between clinical visits. S    Occupational performance deficits (Please refer to evaluation for details): ADL's;IADL's;Rest and Sleep;Play;Leisure;Social Participation;Other   body image   Rehab Potential Fair    Clinical Decision Making Multiple treatment options, significant modification of task necessary    Comorbidities Affecting Occupational Performance: Presence of comorbidities impacting occupational performance    Modification or Assistance to Complete Evaluation  Max significant modification of tasks or assist is necessary to complete    OT Frequency 1x / week    OT Duration 6 weeks   and PRN   OT Treatment/Interventions  Self-care/ADL training;DME and/or AE instruction;Manual lymph drainage;Patient/family education;Compression bandaging;Therapeutic exercise;Manual Therapy;Other (comment)    Plan skin care to decrease infection risk and improve hydration    Recommended Other Services fit with adjustable , alternative compression wraps for optimal independence w/ donning and doffing. Cnsider CircAid with footpieces.    Consulted and Agree with Plan of Care Patient;Family member/caregiver           Patient will benefit from skilled therapeutic intervention in order to improve the following deficits and impairments:           Visit Diagnosis: Lymphedema, not elsewhere classified    Problem List Patient Active Problem List   Diagnosis Date Noted  . Medication noncompliance due to cognitive impairment 10/17/2019  . Chronic pain of multiple joints 08/01/2017  . Age-related osteoporosis without current pathological fracture 08/01/2017  . Left knee pain 09/19/2016  . Headache 06/22/2015  . Rectal benign neoplasm   . Asthma, chronic 04/04/2015  . history of Tubular adenoma of colon 04/04/2015  . 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 Hypokalemia 04/04/2015  . CKD (chronic kidney disease) stage 3, GFR 30-59 ml/min (HCC) 04/03/2015  . Dehydration 04/03/2015  . History of colonic polyps 12/08/2014  . Edema 04/14/2014  . Chronic systolic heart failure (HCC) 11/20/2013  . Nausea with vomiting 11/13/2013  . Lymphedema of lower extremity 10/17/2013  . 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 MGUS (monoclonal gammopathy of unknown significance) 05/15/2012  . Vitamin D deficiency 01/29/2010  . BURSITIS, LEFT ELBOW 01/29/2010  . DERMATITIS 09/30/2009  . DIABETES MELLITUS, BORDERLINE 01/11/2009  . Paranoid delusion (HCC) 08/02/2008  . Obstructive chronic bronchitis (HCC) 08/02/2008  . Essential hypertension 11/11/2007  . Venous (peripheral) insufficiency 11/11/2007  . RENAL CALCULUS 11/11/2007  .  LOW BACK PAIN, CHRONIC 11/11/2007    Loel Dubonnet, MS, OTR/L, [facility] 09/21/20 5:26 PM  [place] [facility] MAIN [facility] SERVICES [address] [place], [place], [address] Phone: [phone]   Fax:  [phone]  Name: TEKOA HAMOR MRN: [id number] Date of Birth:  10/30/1931 

## 2020-09-27 ENCOUNTER — Encounter: Payer: Self-pay | Admitting: Nurse Practitioner

## 2020-09-27 ENCOUNTER — Ambulatory Visit (INDEPENDENT_AMBULATORY_CARE_PROVIDER_SITE_OTHER): Payer: Medicare Other | Admitting: Nurse Practitioner

## 2020-09-27 ENCOUNTER — Other Ambulatory Visit: Payer: Self-pay

## 2020-09-27 VITALS — BP 132/84 | HR 55 | Temp 96.8°F | Ht <= 58 in | Wt 142.0 lb

## 2020-09-27 DIAGNOSIS — I890 Lymphedema, not elsewhere classified: Secondary | ICD-10-CM

## 2020-09-27 DIAGNOSIS — N1832 Chronic kidney disease, stage 3b: Secondary | ICD-10-CM | POA: Diagnosis not present

## 2020-09-27 DIAGNOSIS — J45909 Unspecified asthma, uncomplicated: Secondary | ICD-10-CM | POA: Diagnosis not present

## 2020-09-27 DIAGNOSIS — Z23 Encounter for immunization: Secondary | ICD-10-CM | POA: Diagnosis not present

## 2020-09-27 DIAGNOSIS — I10 Essential (primary) hypertension: Secondary | ICD-10-CM | POA: Diagnosis not present

## 2020-09-27 DIAGNOSIS — M810 Age-related osteoporosis without current pathological fracture: Secondary | ICD-10-CM | POA: Diagnosis not present

## 2020-09-27 DIAGNOSIS — J449 Chronic obstructive pulmonary disease, unspecified: Secondary | ICD-10-CM | POA: Diagnosis not present

## 2020-09-27 DIAGNOSIS — I5022 Chronic systolic (congestive) heart failure: Secondary | ICD-10-CM

## 2020-09-27 DIAGNOSIS — J4489 Other specified chronic obstructive pulmonary disease: Secondary | ICD-10-CM

## 2020-09-27 DIAGNOSIS — H6123 Impacted cerumen, bilateral: Secondary | ICD-10-CM | POA: Diagnosis not present

## 2020-09-27 DIAGNOSIS — F22 Delusional disorders: Secondary | ICD-10-CM

## 2020-09-27 DIAGNOSIS — E559 Vitamin D deficiency, unspecified: Secondary | ICD-10-CM

## 2020-09-27 MED ORDER — ADVAIR HFA 115-21 MCG/ACT IN AERO: INHALATION_SPRAY | RESPIRATORY_TRACT | 5 refills | Status: DC

## 2020-09-27 NOTE — Progress Notes (Signed)
Careteam: Patient Care Team: Sophia Chandler, NP as PCP - General (Geriatric Medicine) [name], MD (Pulmonary Disease) Jacolyn Reedy, MD as Consulting Physician (Cardiology)  PLACE OF SERVICE:  [place]  Advanced Directive information    Allergies  Allergen Reactions   Aspirin Other (See Comments)    REACTION: upset stomach   Codeine Other (See Comments)    REACTION: "MAKES MY BODY GO CRAZY; CRAMPS"   Latex Nausea And Vomiting    Chief Complaint  Patient presents with   Medical Management of Chronic Issues    4 month follow-up. Discuss cramping in legs. Discuss rx request for prescribed vit D. Flu vaccine today.     HPI: Patient is a 86 y.o. female for routine follow up  Followed by lymphemia clinic due bilateral LE edema  Last OV in august there was confusion over her medication, she was taking potassium without Demadex Currently taking demadex 20 mg daily with potassium 20 meq  Vit d def- taking vit d 50,000 units weekly  Osteoporosis- continues cal and vit d BID with prolia   Anemia- on iron supplement   GERD- well controlled on omeprazole.   hnt- controlled on coreg twice daily   Asthma- not taking advair.   Review of Systems:  Review of Systems  Constitutional: Negative for chills, fever and weight loss.  HENT: Negative for tinnitus.   Respiratory: Negative for cough, sputum production and shortness of breath.   Cardiovascular: Positive for leg swelling. Negative for chest pain and palpitations.  Gastrointestinal: Negative for abdominal pain, constipation, diarrhea and heartburn.  Genitourinary: Negative for dysuria, frequency and urgency.  Musculoskeletal: Negative for back pain, falls, joint pain and myalgias.  Skin: Negative.   Neurological: Negative for dizziness and headaches.  Psychiatric/Behavioral: Positive for memory loss. Negative for depression. The patient is nervous/anxious. The patient does not have insomnia.      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[place]) 0/06/3817   Systolic heart failure ([place])    EF 35 to 40% per echo January 2015   Unspecified essential hypertension    Unspecified nonpsychotic mental disorder    Unspecified venous (peripheral) insufficiency    Unspecified venous (peripheral) insufficiency    Unspecified vitamin D deficiency    Past Surgical History:  Procedure Laterality Date   APPENDECTOMY     CERVICAL SPINE SURGERY     x 2   CHOLECYSTECTOMY     ESOPHAGOGASTRODUODENOSCOPY N/A 04/05/2015   Procedure: ESOPHAGOGASTRODUODENOSCOPY (EGD);  Surgeon: [name], MD;  Location: [facility];  Service: Endoscopy;  Laterality: N/A;   FLEXIBLE SIGMOIDOSCOPY N/A 04/05/2015   Procedure: FLEXIBLE SIGMOIDOSCOPY;  Surgeon: [name], MD;  Location: [facility];  Service: Endoscopy;  Laterality: N/A;   HAND SURGERY     LUMBAR DISC SURGERY     PARTIAL HYSTERECTOMY     TONSILLECTOMY     Social History:   reports that she has never smoked. She has never used smokeless tobacco. She reports that she does not drink alcohol and does not use drugs.  Family  History  Problem Relation Age of Onset   Diabetes Mother    Arthritis Father    Diabetes Paternal Grandfather        entire family on both sides   Heart disease Maternal Aunt        entire family of both sides   Cancer Paternal Aunt        type unknown   Diabetes Sister    Breast cancer Cousin     Medications: Patient's Medications  New Prescriptions   No medications on file  Previous Medications   ACETAMINOPHEN (TYLENOL) 650 MG CR TABLET    Take 650 mg by mouth as  needed for pain.   ALBUTEROL (PROAIR HFA) 108 (90 BASE) MCG/ACT INHALER    INHALE 2 PUFFS EVERY 4 HOURS AS NEEDED FOR WHEEZING   CALCIUM-VITAMIN D (OSCAL WITH D) 500-200 MG-UNIT TABLET    Take 1 tablet by mouth daily with breakfast.   CARVEDILOL (COREG) 6.25 MG TABLET    Take 1 tablet twice daily in lunch and supper.   CETIRIZINE (ZYRTEC) 10 MG TABLET    Take 1 tablet (10 mg total) by mouth daily.   DENOSUMAB (PROLIA) 60 MG/ML SOSY INJECTION    Inject 60 mg into the skin every 6 (six) months.   FEROSUL 325 (65 FE) MG TABLET    Take 1 tablet (325 mg total) by mouth daily with breakfast.   FLUTICASONE-SALMETEROL (ADVAIR HFA) 115-21 MCG/ACT INHALER    INHALE 2 PUFFS BY MOUTH TWICE DAILY   HYDRALAZINE (APRESOLINE) 50 MG TABLET    TAKE 1 TABLET BY MOUTH TWICE DAILY AT 8 AM AND 10 PM   OLANZAPINE (ZYPREXA) 5 MG TABLET    Take 1 tablet (5 mg total) by mouth at bedtime.   OMEPRAZOLE (PRILOSEC) 20 MG CAPSULE    Take 1 capsule (20 mg total) by mouth daily.   POTASSIUM CHLORIDE SA (KLOR-CON) 20 MEQ TABLET    Take 1 tablet (20 mEq total) by mouth daily.   TORSEMIDE (DEMADEX) 20 MG TABLET    Take 1 tablet (20 mg total) by mouth daily.   VITAMIN D, CHOLECALCIFEROL, 25 MCG (1000 UT) CAPS    Take 1 capsule by mouth daily.  Modified Medications   No medications on file  Discontinued Medications   ASCORBIC ACID (VITAMIN C) 1000 MG TABLET    2 by mouth twice daily before lunch and supper     Physical Exam:  Vitals:   09/27/20 1524  BP: 132/84  Pulse: (!) 55  Temp: (!) 96.8 F (36 C)  TempSrc: Temporal  SpO2: 99%  Weight: 142 lb (64.4 kg)  Height: 4\' 10"  (1.473 m)   Body mass index is 29.68 kg/m. Wt Readings from Last 3 Encounters:  09/27/20 142 lb (64.4 kg)  06/25/20 138 lb 9.6 oz (62.9 kg)  06/18/20 139 lb 12.8 oz (63.4 kg)    Physical Exam Constitutional:      General: She is not in acute distress.    Appearance: She is well-developed. She is not diaphoretic.  HENT:     Head:  Normocephalic and atraumatic.     Right Ear: There is impacted cerumen.     Left Ear: There is impacted cerumen.  Eyes:     Conjunctiva/sclera: Conjunctivae normal.     Pupils: Pupils are equal, round, and reactive to light.  Cardiovascular:     Rate and Rhythm: Normal rate and regular rhythm.     Heart sounds: Normal heart sounds.  Pulmonary:  Effort: Pulmonary effort is normal.     Breath sounds: Normal breath sounds.  Abdominal:     General: Bowel sounds are normal.     Palpations: Abdomen is soft.  Musculoskeletal:        General: No tenderness.     Cervical back: Normal range of motion and neck supple.     Right lower leg: Edema present.     Left lower leg: Edema present.  Skin:    General: Skin is warm and dry.  Neurological:     Mental Status: She is alert and oriented to person, place, and time.  Psychiatric:        Cognition and Memory: Cognition is impaired. Memory is impaired.     Labs reviewed: Basic Metabolic Panel: Recent Labs    05/26/20 0959 06/16/20 1843 06/18/20 1824  NA 140 139 139  K 4.1 4.2 4.1  CL 105 108 108  CO2 27 22 25   GLUCOSE 85 88 84  BUN 26* 16 22  CREATININE 1.30* 1.08* 1.37*  CALCIUM 8.7 8.9 8.7*   Liver Function Tests: Recent Labs    05/24/20 1508 05/26/20 0959 06/18/20 1824  AST 12 12 16   ALT 7 6 9   ALKPHOS  --   --  61  BILITOT 0.3 0.3 0.4  PROT 7.9 8.1 8.5*  ALBUMIN  --   --  3.1*   Recent Labs    05/24/20 1508 05/26/20 0959  LIPASE 33 24  AMYLASE 295* 147*   No results for input(s): AMMONIA in the last 8760 hours. CBC: Recent Labs    05/24/20 1508 05/24/20 1508 05/26/20 0959 06/16/20 1843 06/18/20 1824  WBC 5.6   < > 4.5 4.3 4.7  NEUTROABS 4,049  --  2,925  --  3.0  HGB 9.6*   < > 10.0* 10.3* 10.3*  HCT 30.9*   < > 32.5* 35.4* 35.0*  MCV 87.0   < > 86.2 90.1 88.4  PLT 293   < > 290 201 226   < > = values in this interval not displayed.   Lipid Panel: No results for input(s): CHOL, HDL, LDLCALC,  TRIG, CHOLHDL, LDLDIRECT in the last 8760 hours. TSH: No results for input(s): TSH in the last 8760 hours. A1C: Lab Results  Component Value Date   HGBA1C 5.5 04/14/2014     Assessment/Plan 1. Need for influenza vaccination - Flu Vaccine QUAD High Dose(Fluad)  3. Age-related osteoporosis without current pathological fracture -continues on prolia with cal and vit d supplement.   4. Stage 3b chronic kidney disease ([place]) -Encourage proper hydration and to avoid NSAIDS (Aleve, Advil, Motrin, Ibuprofen)   5. Lymphedema of both lower extremities Ongoing, continues with lymphedema clinic for bilateral LE leg wraps.   6. Paranoid delusion ([place]) -stable on Zyprexa   7. Vitamin D deficiency -vit d level on high normal range, to stop vit d 50,000 weekly but to continue cal and vit d suppelment.   9. Essential hypertension -controlled on current regimne  - CBC with Differential/Platelet - COMPLETE METABOLIC PANEL WITH GFR  10. Chronic systolic heart failure (HCC) Stable, without worsening shortness of breath, chest pains, LE edema. Continues on demedex with potassium   11. Obstructive chronic bronchitis (HCC) -stable, faint wheezing noted on exam, she has not ben taking advair routinely, encouraged to refill and compliance with taking medication.  - fluticasone-salmeterol (ADVAIR HFA) 115-21 MCG/ACT inhaler; INHALE 2 PUFFS BY MOUTH TWICE DAILY  Dispense: 36 g; Refill: 5  11. Bilateral  impacted cerumen -lavage preformed but unable to remove. Instructed to use debrox BID for 4 days then return for follow up.   Next appt: 4 days for ear lavage  [name] K. [place], [facility] [phone]

## 2020-09-27 NOTE — Patient Instructions (Signed)
To stop Vit D 50,000 units weekly and continue cal with vit D supplement, her vit D great on last labs  To restart iron supplement daily  To restart advair twice daily

## 2020-09-28 ENCOUNTER — Ambulatory Visit: Payer: Medicare Other | Admitting: Occupational Therapy

## 2020-09-28 ENCOUNTER — Other Ambulatory Visit: Payer: Self-pay | Admitting: Nurse Practitioner

## 2020-09-28 DIAGNOSIS — I890 Lymphedema, not elsewhere classified: Secondary | ICD-10-CM | POA: Diagnosis not present

## 2020-09-28 DIAGNOSIS — R1013 Epigastric pain: Secondary | ICD-10-CM

## 2020-09-28 LAB — CBC WITH DIFFERENTIAL/PLATELET
Absolute Monocytes: 453 cells/uL (ref 200–950)
Basophils Absolute: 9 cells/uL (ref 0–200)
Basophils Relative: 0.2 %
Eosinophils Absolute: 0 cells/uL — ABNORMAL LOW (ref 15–500)
Eosinophils Relative: 0 %
HCT: 31.7 % — ABNORMAL LOW (ref 35.0–45.0)
Hemoglobin: 10.1 g/dL — ABNORMAL LOW (ref 11.7–15.5)
Lymphs Abs: 1228 cells/uL (ref 850–3900)
MCH: 27.2 pg (ref 27.0–33.0)
MCHC: 31.9 g/dL — ABNORMAL LOW (ref 32.0–36.0)
MCV: 85.4 fL (ref 80.0–100.0)
MPV: 9.6 fL (ref 7.5–12.5)
Monocytes Relative: 10.3 %
Neutro Abs: 2710 cells/uL (ref 1500–7800)
Neutrophils Relative %: 61.6 %
Platelets: 213 10*3/uL (ref 140–400)
RBC: 3.71 10*6/uL — ABNORMAL LOW (ref 3.80–5.10)
RDW: 14.1 % (ref 11.0–15.0)
Total Lymphocyte: 27.9 %
WBC: 4.4 10*3/uL (ref 3.8–10.8)

## 2020-09-28 LAB — COMPLETE METABOLIC PANEL WITH GFR
AG Ratio: 0.7 (calc) — ABNORMAL LOW (ref 1.0–2.5)
ALT: 8 U/L (ref 6–29)
AST: 14 U/L (ref 10–35)
Albumin: 3.5 g/dL — ABNORMAL LOW (ref 3.6–5.1)
Alkaline phosphatase (APISO): 60 U/L (ref 37–153)
BUN/Creatinine Ratio: 17 (calc) (ref 6–22)
BUN: 25 mg/dL (ref 7–25)
CO2: 27 mmol/L (ref 20–32)
Calcium: 10 mg/dL (ref 8.6–10.4)
Chloride: 103 mmol/L (ref 98–110)
Creat: 1.47 mg/dL — ABNORMAL HIGH (ref 0.60–0.88)
GFR, Est African American: 37 mL/min/{1.73_m2} — ABNORMAL LOW (ref >=60)
GFR, Est Non African American: 32 mL/min/{1.73_m2} — ABNORMAL LOW (ref >=60)
Globulin: 5.3 g/dL (calc) — ABNORMAL HIGH (ref 1.9–3.7)
Glucose, Bld: 74 mg/dL (ref 65–139)
Potassium: 3.7 mmol/L (ref 3.5–5.3)
Sodium: 138 mmol/L (ref 135–146)
Total Bilirubin: 0.4 mg/dL (ref 0.2–1.2)
Total Protein: 8.8 g/dL — ABNORMAL HIGH (ref 6.1–8.1)

## 2020-09-28 NOTE — Therapy (Signed)
[place] MAIN [facility] SERVICES [address] [place], [place], [address] Phone: [phone]   Fax:  [phone]  Occupational Therapy Treatment  Patient Details  Name: [name] MRN: [id number] Date of Birth: [birth year] Referring Provider (OT): Sandrea Hughs, NP   Encounter Date: 09/28/2020   OT End of Session - 09/28/20 1650    Visit Number 11    Number of Visits 36    Date for OT Re-Evaluation 09/29/20    OT Start Time 0305    OT Stop Time 0410    OT Time Calculation (min) 65 min    Activity Tolerance Patient tolerated treatment well;No increased pain    Behavior During Therapy WFL for tasks assessed/performed           Past Medical History:  Diagnosis Date   Anemia, unspecified    Anxiety    Arthritis    Asthma    Delusions ([place])    Depression    Edema 2005   "since put the glass in them"    GERD (gastroesophageal reflux disease)    Lumbago    Lymphedema    Obstructive chronic bronchitis without exacerbation ([place])    Other abnormal glucose    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[place]) 0/0/7121   Systolic heart failure ([place])    EF 35 to 40% per echo January 2015   Unspecified essential hypertension    Unspecified nonpsychotic mental disorder    Unspecified venous (peripheral) insufficiency    Unspecified venous (peripheral) insufficiency    Unspecified vitamin D deficiency     Past Surgical History:  Procedure Laterality Date   APPENDECTOMY     CERVICAL SPINE SURGERY     x 2   CHOLECYSTECTOMY     ESOPHAGOGASTRODUODENOSCOPY N/A 04/05/2015   Procedure: ESOPHAGOGASTRODUODENOSCOPY (EGD);  Surgeon: [name], MD;  Location: [facility];  Service: Endoscopy;  Laterality: N/A;   FLEXIBLE SIGMOIDOSCOPY N/A 04/05/2015   Procedure: FLEXIBLE SIGMOIDOSCOPY;  Surgeon: [name], MD;  Location: [facility];  Service: Endoscopy;  Laterality: N/A;   HAND SURGERY     LUMBAR DISC SURGERY     PARTIAL  HYSTERECTOMY     TONSILLECTOMY      There were no vitals filed for this visit.   Subjective Assessment - 09/28/20 1513    Subjective  [name] presents for OT Rx visit 11/36 to address BLE lymphedma. Pt is accompanied by her nephew, [name]. Pt reports 7/10 LE related leg pain, L>R. Pt's nephew reports Pt has been in compression for LLE daily since lasy OT visit up until today.    Patient is accompanied by: Family member    Pertinent History Chronic obstructive bronchitis, CVI, anxiety , systoloc heart failure, asthma, arthritis, multiple spinal surgeries w/ chronic back pain, DM,    Limitations Difficulty walking, impaired standing tolerance, impaired transfers, decreased joint AROM at toes, ankles and knees, chronic leg swelling , chronic pain. increased infection and falls risks, cognitive impairment, generaized weakness    Repetition Increases Symptoms    Special Tests + [place]mer bilaterally    Patient Stated Goals reduce swelling and legs feel better    Pain Onset Other (comment)   >20 years              LYMPHEDEMA/ONCOLOGY QUESTIONNAIRE - 09/28/20 0001      Lymphedema Assessments   Lymphedema Assessments Lower extremities      Right Lower Extremity Lymphedema   Other L leg from ankle to tibia  tuberosity (A-D) measures 3979.4 ml.    Other LLE limb volume is decreased by 8.4% since recommencing OT for CDT on 07/06/20                   OT Treatments/Exercises (OP) - 09/28/20 0001      ADLs   ADL Education Given Yes      Manual Therapy   Manual Therapy Edema management    Edema Management LLE comparative limb volumetrics    Manual Lymphatic Drainage (MLD) MLD to LLE as established. Pants worn to clinic severly limit access to inguinal LN      for proximal MLD.    Compression Bandaging LLE fitted with CircAid adjustable leg wrap. Unable to apply foot wraps as necessary materials unavailable today.                  OT Education - 09/28/20 1649     Education provided Yes    Education Details Continued skilled Pt/caregiver education  And LE ADL training throughout visit for lymphedema self care/ home program, including compression wrapping, compression garment and device wear/care, lymphatic pumping ther ex, simple self-MLD, and skin care. Discussed progress towards goals.    Person(s) Educated Patient;Caregiver(s);Other (comment)    Methods Explanation;Demonstration;Handout    Comprehension Verbalized understanding;Returned demonstration;Need further instruction;Verbal cues required               OT Long Term Goals - 09/21/20 1725      OT LONG TERM GOAL #1   Title With maximum caregiver assistance Pt will be able to apply knee length, multi-layer, short stretch compression wraps daily from ankle to tibial tuberosity on one leg at a time using correct gradient techniques to return affected limb/s, as closely as possible, to premorbid size and shape, to limit leg pain and infection risk, and to improve safe functional mobility and ADLs performance.    Baseline dependent    Time 4    Period Days    Status Partially Met      OT LONG TERM GOAL #2   Title With Maximum caregiver assistance Pt will be able to verbalize signs and symptoms of cellulitis infection and identify lymphedema precautions using printed resource (modified independence) for reference to decrease infection risk and limit LE progression over time.    Baseline dependent    Time 6    Period Days    Status Partially Met      OT LONG TERM GOAL #3   Title With Maximum caregiver assistance Pt >85 % compliant with all daily, LE self-care protocols for home program, including simple self-manual lymphatic drainage (MLD), skin care, lymphatic pumping ther ex, skin care, and compression wraps and garments  to limit LE progression and further functional decline.    Baseline dependent    Time 6    Period Weeks    Status Partially Met      OT LONG TERM GOAL #4   Baseline  dependent    Time 6    Period Weeks    Status Partially Met      OT LONG TERM GOAL #5   Title Pt to tolerate compression wraps/ garments in keeping w/ prescribed daily wear regime within 1 week of issue date to progress and retain clinical and functional gains and to limit LE progression.    Baseline dependent    Time 6    Period Weeks    Status Partially Met      OT LONG TERM  GOAL #6   Title Pt to achieve at least 10% LLE limb volume reduction bilaterally during modified Intensive Phase CDT to reduce infection risk, risk of recurrent leg wounds, to improve functional performance of basic and instrumental ADLs, and to limit LE progression.    Baseline dependent    Time 6    Period Weeks    Status Partially Met                 Plan - 09/28/20 1650    Clinical Impression Statement Completed LLE comparative limb volumetrics to measure progress towards reduction goal. LLE limb volume is decreased by 8.4% since recommencing OT for CDT on 07/06/20. This value appraches 10 % goal for the LLE. Skin condition on feet bilaterally is improved today, but swelling in feet is increased since last visit. Pockets of fluid are noted anteriorly on distal L leg due to uneven compression in wrap style CircAids used last session and during visit interval. Still undecided if CircAids are the way to go when Pt shits to self management phase, but for present we will continue wrapping to achieve the best cclinical outtcome we can. Cont as per POC at 1 x weekly for next 12 weeks. Continue to encourage consistent caregiver and family support.    Occupational Profile and client history currently impacting functional performance dimensia, inconsistent CG assistance with removing wraps, providing skin care and reapplying compression between clinical visits. S    Occupational performance deficits (Please refer to evaluation for details): ADL's;IADL's;Rest and Sleep;Play;Leisure;Social Participation;Other   body image     Rehab Potential Fair    Clinical Decision Making Multiple treatment options, significant modification of task necessary    Comorbidities Affecting Occupational Performance: Presence of comorbidities impacting occupational performance    Modification or Assistance to Complete Evaluation  Max significant modification of tasks or assist is necessary to complete    OT Frequency 1x / week    OT Duration 6 weeks   and PRN   OT Treatment/Interventions Self-care/ADL training;DME and/or AE instruction;Manual lymph drainage;Patient/family education;Compression bandaging;Therapeutic exercise;Manual Therapy;Other (comment)    Plan skin care to decrease infection risk and improve hydration    Recommended Other Services fit with adjustable , alternative compression wraps for optimal independence w/ donning and doffing. Cnsider CircAid with footpieces.    Consulted and Agree with Plan of Care Patient;Family member/caregiver           Patient will benefit from skilled therapeutic intervention in order to improve the following deficits and impairments:           Visit Diagnosis: Lymphedema, not elsewhere classified    Problem List Patient Active Problem List   Diagnosis Date Noted   Medication noncompliance due to cognitive impairment 10/17/2019   Chronic pain of multiple joints 08/01/2017   Age-related osteoporosis without current pathological fracture 08/01/2017   Left knee pain 09/19/2016   Rectal benign neoplasm    Asthma, chronic 04/04/2015   history of Tubular adenoma of colon 04/04/2015   CKD (chronic kidney disease) stage 3, GFR 30-59 ml/min (HCC) 04/03/2015   Edema 41/28/7867   Chronic systolic heart failure ([place]) 11/20/2013   Lymphedema of lower extremity 10/17/2013   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 [place], [place], [address] Phone: [phone]   Fax:  [phone]  Name: [name] MRN: [id number] Date of Birth: [birth year]

## 2020-09-30 ENCOUNTER — Other Ambulatory Visit: Payer: Self-pay | Admitting: Nurse Practitioner

## 2020-09-30 DIAGNOSIS — I890 Lymphedema, not elsewhere classified: Secondary | ICD-10-CM

## 2020-09-30 DIAGNOSIS — I5022 Chronic systolic (congestive) heart failure: Secondary | ICD-10-CM

## 2020-09-30 DIAGNOSIS — I10 Essential (primary) hypertension: Secondary | ICD-10-CM

## 2020-09-30 DIAGNOSIS — E559 Vitamin D deficiency, unspecified: Secondary | ICD-10-CM

## 2020-10-01 ENCOUNTER — Ambulatory Visit (INDEPENDENT_AMBULATORY_CARE_PROVIDER_SITE_OTHER): Payer: Medicare Other | Admitting: Nurse Practitioner

## 2020-10-01 ENCOUNTER — Other Ambulatory Visit: Payer: Self-pay

## 2020-10-01 ENCOUNTER — Encounter: Payer: Self-pay | Admitting: Nurse Practitioner

## 2020-10-01 VITALS — BP 140/80 | HR 61 | Temp 97.6°F | Ht <= 58 in | Wt 143.0 lb

## 2020-10-01 DIAGNOSIS — H6123 Impacted cerumen, bilateral: Secondary | ICD-10-CM | POA: Diagnosis not present

## 2020-10-01 NOTE — Progress Notes (Signed)
Careteam: Patient Care Team: Lauree Chandler, NP as PCP - General (Geriatric Medicine) [name], MD (Pulmonary Disease) Jacolyn Reedy, MD as Consulting Physician (Cardiology)  PLACE OF SERVICE:  [facility]  Advanced Directive information    Allergies  Allergen Reactions  . Aspirin Other (See Comments)    REACTION: upset stomach  . Codeine Other (See Comments)    REACTION: "MAKES MY BODY GO CRAZY; CRAMPS"  . Latex Nausea And Vomiting    Chief Complaint  Patient presents with  . Follow-up    Need ears re-examined for cerumen impactioin.  Patient just got the debrox, so she has not been using it. Patient in office with nephew [name].     HPI: Patient is a 86 y.o. female to follow up cerumen impaction. They did not use debrox but did obtain it.  Would like to flush ears because they are here today.   Review of Systems:  Review of Systems  HENT: Positive for hearing loss.     Past Medical History:  Diagnosis Date  . Anemia, unspecified   . Anxiety   . Arthritis   . Asthma   . Delusions ([place])   . Depression   . Edema 2005   "since put the glass in them"   . GERD (gastroesophageal reflux disease)   . Lumbago   . Lymphedema   . Obstructive chronic bronchitis without exacerbation ([place])   . Other abnormal glucose   . 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[place]) 04/04/2013  . Systolic heart failure (HCC)    EF 35 to 40% per echo January 2015  . Unspecified essential hypertension   . Unspecified nonpsychotic mental disorder   . Unspecified venous (peripheral) insufficiency   . Unspecified venous (peripheral) insufficiency   . Unspecified vitamin D deficiency    Past Surgical History:  Procedure Laterality Date  . APPENDECTOMY    . CERVICAL SPINE SURGERY     x 2  . CHOLECYSTECTOMY    . ESOPHAGOGASTRODUODENOSCOPY N/A 04/05/2015   Procedure: ESOPHAGOGASTRODUODENOSCOPY (EGD);  Surgeon: [name], MD;  Location: [facility];  Service: Endoscopy;  Laterality: N/A;  .  FLEXIBLE SIGMOIDOSCOPY N/A 04/05/2015   Procedure: FLEXIBLE SIGMOIDOSCOPY;  Surgeon: [name], MD;  Location: [facility];  Service: Endoscopy;  Laterality: N/A;  . HAND SURGERY    . LUMBAR DISC SURGERY    . PARTIAL HYSTERECTOMY    . TONSILLECTOMY     Social History:   reports that she has never smoked. She has never used smokeless tobacco. She reports that she does not drink alcohol and does not use drugs.  Family History  Problem Relation Age of Onset  . Diabetes Mother   . Arthritis Father   . Diabetes Paternal Grandfather        entire family on both sides  . Heart disease Maternal Aunt        entire family of both sides  . Cancer Paternal Aunt        type unknown  . Diabetes Sister   . Breast cancer Cousin     Medications: Patient's Medications  New Prescriptions   No medications on file  Previous Medications   ACETAMINOPHEN (TYLENOL) 650 MG CR TABLET    Take 650 mg by mouth as needed for pain.   ALBUTEROL (PROAIR HFA) 108 (90 BASE) MCG/ACT INHALER    INHALE 2 PUFFS EVERY 4 HOURS AS NEEDED FOR WHEEZING   CALCIUM-VITAMIN D (OSCAL WITH D) 500-200 MG-UNIT TABLET  Take 1 tablet by mouth daily with breakfast.   CARVEDILOL (COREG) 6.25 MG TABLET    TAKE 1 TABLET BY MOUTH TWICE DAILY IN LUNCH AND SUPPER   CETIRIZINE (ZYRTEC) 10 MG TABLET    Take 1 tablet (10 mg total) by mouth daily.   DENOSUMAB (PROLIA) 60 MG/ML SOSY INJECTION    Inject 60 mg into the skin every 6 (six) months.   FEROSUL 325 (65 FE) MG TABLET    Take 1 tablet (325 mg total) by mouth daily with breakfast.   FLUTICASONE-SALMETEROL (ADVAIR HFA) 115-21 MCG/ACT INHALER    INHALE 2 PUFFS BY MOUTH TWICE DAILY   HYDRALAZINE (APRESOLINE) 25 MG TABLET    Take 25 mg by mouth 2 (two) times daily.   HYDRALAZINE (APRESOLINE) 50 MG TABLET    TAKE 1 TABLET BY MOUTH TWICE DAILY AT 8 AM AND 10 PM   OLANZAPINE (ZYPREXA) 5 MG TABLET    Take 1 tablet (5 mg total) by mouth at bedtime.   OMEPRAZOLE (PRILOSEC) 20 MG CAPSULE     TAKE 1 CAPSULE(20 MG) BY MOUTH DAILY   POTASSIUM CHLORIDE SA (KLOR-CON) 20 MEQ TABLET    TAKE 2 TABLETS BY MOUTH TWICE DAILY BEFORE LUNCH AND SUPPER   TORSEMIDE (DEMADEX) 20 MG TABLET    Take 1 tablet (20 mg total) by mouth daily.   VITAMIN D, ERGOCALCIFEROL, (DRISDOL) 1.25 MG (50000 UNIT) CAPS CAPSULE    Take 50,000 Units by mouth once a week.  Modified Medications   No medications on file  Discontinued Medications   No medications on file    Physical Exam:  Vitals:   10/01/20 1518  BP: 140/80  Pulse: 61  Temp: 97.6 F (36.4 C)  TempSrc: Temporal  SpO2: 98%  Weight: 143 lb (64.9 kg)  Height: 4\' 10"  (1.473 m)   Body mass index is 29.89 kg/m. Wt Readings from Last 3 Encounters:  10/01/20 143 lb (64.9 kg)  09/27/20 142 lb (64.4 kg)  06/25/20 138 lb 9.6 oz (62.9 kg)    Physical Exam Constitutional:      Appearance: Normal appearance.  HENT:     Head: Normocephalic and atraumatic.     Right Ear: There is impacted cerumen.     Left Ear: There is impacted cerumen.  Skin:    General: Skin is warm and dry.  Neurological:     Mental Status: She is alert.     Labs reviewed: Basic Metabolic Panel: Recent Labs    06/16/20 1843 06/18/20 1824 09/27/20 1634  NA 139 139 138  K 4.2 4.1 3.7  CL 108 108 103  CO2 22 25 27   GLUCOSE 88 84 74  BUN 16 22 25   CREATININE 1.08* 1.37* 1.47*  CALCIUM 8.9 8.7* 10.0   Liver Function Tests: Recent Labs    05/26/20 0959 06/18/20 1824 09/27/20 1634  AST 12 16 14   ALT 6 9 8   ALKPHOS  --  61  --   BILITOT 0.3 0.4 0.4  PROT 8.1 8.5* 8.8*  ALBUMIN  --  3.1*  --    Recent Labs    05/24/20 1508 05/26/20 0959  LIPASE 33 24  AMYLASE 295* 147*   No results for input(s): AMMONIA in the last 8760 hours. CBC: Recent Labs    05/26/20 0959 05/26/20 0959 06/16/20 1843 06/18/20 1824 09/27/20 1634  WBC 4.5   < > 4.3 4.7 4.4  NEUTROABS 2,925  --   --  3.0 2,710  HGB 10.0*   < >  10.3* 10.3* 10.1*  HCT 32.5*   < > 35.4* 35.0*  31.7*  MCV 86.2   < > 90.1 88.4 85.4  PLT 290   < > 201 226 213   < > = values in this interval not displayed.   Lipid Panel: No results for input(s): CHOL, HDL, LDLCALC, TRIG, CHOLHDL, LDLDIRECT in the last 8760 hours. TSH: No results for input(s): TSH in the last 8760 hours. A1C: Lab Results  Component Value Date   HGBA1C 5.5 04/14/2014     Assessment/Plan 1. Bilateral impacted cerumen Flushed ears bilaterally but unable to remove wax, plan to use drops and return for flush.  Next appt: 10/06/2020 [name]. [place], [facility] [phone]

## 2020-10-04 ENCOUNTER — Ambulatory Visit: Payer: Medicare Other | Attending: Family | Admitting: Occupational Therapy

## 2020-10-04 ENCOUNTER — Other Ambulatory Visit: Payer: Self-pay

## 2020-10-04 DIAGNOSIS — I890 Lymphedema, not elsewhere classified: Secondary | ICD-10-CM | POA: Diagnosis not present

## 2020-10-04 NOTE — Therapy (Deleted)
[facility] SERVICES [address] [place], [place], [address] Phone: [phone]   Fax:  [phone]  Occupational Therapy Treatment  Patient Details  Name: [name] MRN: [id number] Date of Birth: 08/17/1932 Referring Provider (OT): Dinah C Ngetich, NP   Encounter Date: 10/04/2020    Past Medical History:  Diagnosis Date  . Anemia, unspecified   . Anxiety   . Arthritis   . Asthma   . Delusions (HCC)   . Depression   . Edema 2005   "since put the glass in them"   . GERD (gastroesophageal reflux disease)   . Lumbago   . Lymphedema   . Obstructive chronic bronchitis without exacerbation (HCC)   . Other abnormal glucose   . 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 Systolic heart failure (HCC)    EF 35 to 40% per echo January 2015  . Unspecified essential hypertension   . Unspecified nonpsychotic mental disorder   . Unspecified venous (peripheral) insufficiency   . Unspecified venous (peripheral) insufficiency   . Unspecified vitamin D deficiency     Past Surgical History:  Procedure Laterality Date  . APPENDECTOMY    . CERVICAL SPINE SURGERY     x 2  . CHOLECYSTECTOMY    . ESOPHAGOGASTRODUODENOSCOPY N/A 04/05/2015   Procedure: ESOPHAGOGASTRODUODENOSCOPY (EGD);  Surgeon: Malcolm T Stark, MD;  Location: [facility];  Service: Endoscopy;  Laterality: N/A;  . FLEXIBLE SIGMOIDOSCOPY N/A 04/05/2015   Procedure: FLEXIBLE SIGMOIDOSCOPY;  Surgeon: Malcolm T Stark, MD;  Location: [facility];  Service: Endoscopy;  Laterality: N/A;  . HAND SURGERY    . LUMBAR DISC SURGERY    . PARTIAL HYSTERECTOMY    . TONSILLECTOMY      There were no vitals filed for this visit.                              OT Long Term Goals - 10/04/20 1713      OT LONG TERM GOAL #1   Title With maximum caregiver assistance Pt will be able to apply knee length, multi-layer, short stretch compression wraps daily from ankle to  tibial tuberosity on one leg at a time using correct gradient techniques to return affected limb/s, as closely as possible, to premorbid size and shape, to limit leg pain and infection risk, and to improve safe functional mobility and ADLs performance.    Baseline dependent    Time 4    Period Days    Status Not Met   To date caregiver assistance with compression wrapping, skin care, self MLD and ther ex has been available daily at beginning of episode, but relative has returned home and at present no one is assisting Pt LE home program between visits.   Target Date 12/28/20      OT LONG TERM GOAL #2   Title With Maximum caregiver assistance Pt will be able to verbalize signs and symptoms of cellulitis infection and identify lymphedema precautions using printed resource (modified independence) for reference to decrease infection risk and limit LE progression over time.    Baseline dependent    Time 6    Period Days    Status Partially Met    Target Date 12/28/20      OT LONG TERM GOAL #3   Title With Maximum caregiver assistance Pt >85 % compliant with all daily, LE self-care protocols for home program, including simple self-manual lymphatic drainage (MLD),   skin care, lymphatic pumping ther ex, skin care, and compression wraps and garments  to limit LE progression and further functional decline.    Baseline dependent    Time 6    Period Weeks    Status Not Met   Pt has no assistance with LE home program, and she is unable to consistently and correctly perform skin care, ther ex, simple self MLD and apply wraps since she is unable to reach her feet.   Target Date 12/28/20      OT LONG TERM GOAL #4   Baseline dependent    Time 6    Period Weeks    Status Not Met    Target Date 12/28/20      OT LONG TERM GOAL #5   Title Pt to tolerate compression wraps/ garments in keeping w/ prescribed daily wear regime within 1 week of issue date to progress and retain clinical and functional gains and  to limit LE progression.    Baseline dependent    Time 6    Period Weeks    Status Partially Met   met for LLE   Target Date 12/28/20      OT LONG TERM GOAL #6   Title Pt to achieve at least 10% LLE limb volume reduction bilaterally during modified Intensive Phase CDT to reduce infection risk, risk of recurrent leg wounds, to improve functional performance of basic and instrumental ADLs, and to limit LE progression.    Baseline dependent    Time 6    Period Weeks    Status Partially Met   09/28/20 LLE belwo the knee (A-D) decreased by 8.4%. RLE not yet treated.   Target Date 12/28/20                  Patient will benefit from skilled therapeutic intervention in order to improve the following deficits and impairments:           Visit Diagnosis: Lymphedema, not elsewhere classified - Plan: Ot plan of care cert/re-cert    Problem List Patient Active Problem List   Diagnosis Date Noted  . Medication noncompliance due to cognitive impairment 10/17/2019  . Chronic pain of multiple joints 08/01/2017  . Age-related osteoporosis without current pathological fracture 08/01/2017  . Left knee pain 09/19/2016  . Rectal benign neoplasm   . Asthma, chronic 04/04/2015  . history of Tubular adenoma of colon 04/04/2015  . CKD (chronic kidney disease) stage 3, GFR 30-59 ml/min (HCC) 04/03/2015  . Edema 04/14/2014  . Chronic systolic heart failure ([place]) 11/20/2013  . Lymphedema of lower extremity 10/17/2013  . 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[place]) 04/04/2013  . MGUS (monoclonal gammopathy of unknown significance) 05/15/2012  . Vitamin D deficiency 01/29/2010  . DERMATITIS 09/30/2009  . Paranoid delusion ([place]) 08/02/2008  . Obstructive chronic bronchitis ([place]) 08/02/2008  . Essential hypertension 11/11/2007  . Venous (peripheral) insufficiency 11/11/2007  . RENAL CALCULUS 11/11/2007  . LOW BACK PAIN, CHRONIC 11/11/2007    Andrey Spearman, MS, OTR/L, [facility] 11/23/20 5:35 PM  [place] MAIN [facility] SERVICES [address] [place], [place], [address] Phone: [phone]   Fax:  [phone]  Name: [name] MRN: [id number] Date of Birth: 1932-10-11

## 2020-10-06 ENCOUNTER — Ambulatory Visit: Payer: Medicare Other | Admitting: Nurse Practitioner

## 2020-10-20 ENCOUNTER — Other Ambulatory Visit: Payer: Self-pay

## 2020-10-20 ENCOUNTER — Ambulatory Visit: Payer: Medicare Other | Admitting: Occupational Therapy

## 2020-10-20 DIAGNOSIS — I890 Lymphedema, not elsewhere classified: Secondary | ICD-10-CM | POA: Diagnosis not present

## 2020-10-20 NOTE — Therapy (Signed)
[place] MAIN [facility] SERVICES [address] [place], [place], [address] Phone: [phone]   Fax:  [phone]  Occupational Therapy Treatment  Patient Details  Name: [name] MRN: [id number] Date of Birth: 18-Dec-1931 Referring Provider (OT): Sandrea Hughs, NP   Encounter Date: 10/20/2020   OT End of Session - 10/20/20 1614    Visit Number 13    Number of Visits 36    Date for OT Re-Evaluation 09/29/20    OT Start Time 0250    OT Stop Time 0403    OT Time Calculation (min) 73 min    Activity Tolerance Patient tolerated treatment well;No increased pain    Behavior During Therapy WFL for tasks assessed/performed           Past Medical History:  Diagnosis Date  . Anemia, unspecified   . Anxiety   . Arthritis   . Asthma   . Delusions ([place])   . Depression   . Edema 2005   "since put the glass in them"   . GERD (gastroesophageal reflux disease)   . Lumbago   . Lymphedema   . Obstructive chronic bronchitis without exacerbation ([place])   . Other abnormal glucose   . 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[place]) 04/04/2013  . Systolic heart failure (HCC)    EF 35 to 40% per echo January 2015  . Unspecified essential hypertension   . Unspecified nonpsychotic mental disorder   . Unspecified venous (peripheral) insufficiency   . Unspecified venous (peripheral) insufficiency   . Unspecified vitamin D deficiency     Past Surgical History:  Procedure Laterality Date  . APPENDECTOMY    . CERVICAL SPINE SURGERY     x 2  . CHOLECYSTECTOMY    . ESOPHAGOGASTRODUODENOSCOPY N/A 04/05/2015   Procedure: ESOPHAGOGASTRODUODENOSCOPY (EGD);  Surgeon: [name], MD;  Location: [facility];  Service: Endoscopy;  Laterality: N/A;  . FLEXIBLE SIGMOIDOSCOPY N/A 04/05/2015   Procedure: FLEXIBLE SIGMOIDOSCOPY;  Surgeon: [name], MD;  Location: [facility];  Service: Endoscopy;  Laterality: N/A;  . HAND SURGERY    . LUMBAR DISC SURGERY    . PARTIAL  HYSTERECTOMY    . TONSILLECTOMY      There were no vitals filed for this visit.   Subjective Assessment - 10/20/20 1459    Subjective  Sophia HANRATTY presents for OT Rx visit 13/36 to address BLE lymphedma. Pt is accompanied by her nephew, [name]. Pt rates LE related pain at 7/10 today. Pt's nephew reports she has not worn wraps for 2 days. Pt states, "I keep my feet up too when I'm sitting."    Patient is accompanied by: Family member    Pertinent History Chronic obstructive bronchitis, CVI, anxiety , systoloc heart failure, asthma, arthritis, multiple spinal surgeries w/ chronic back pain, DM,    Limitations Difficulty walking, impaired standing tolerance, impaired transfers, decreased joint AROM at toes, ankles and knees, chronic leg swelling , chronic pain. increased infection and falls risks, cognitive impairment, generaized weakness    Repetition Increases Symptoms    Special Tests + [place] bilaterally    Patient Stated Goals reduce swelling and legs feel better    Pain Onset Other (comment)   >20 years                       OT Treatments/Exercises (OP) - 10/20/20 0001      ADLs   ADL Education Given Yes  Manual Therapy   Manual Therapy Edema management    Edema Management LLE comparative limb volumetrics    Manual Lymphatic Drainage (MLD) MLD to LLE as established. Pants worn to clinic severly limit access to inguinal LN      for proximal MLD.    Compression Bandaging LLE fitted with CircAid adjustable leg wrap. Unable to apply foot wraps as necessary materials unavailable today.                  OT Education - 10/20/20 1503    Education provided Yes    Education Details Continued skilled Pt/caregiver education  And LE ADL training throughout visit for lymphedema self care/ home program, including compression wrapping, compression garment and device wear/care, lymphatic pumping ther ex, simple self-MLD, and skin care. Discussed progress towards  goals.    Person(s) Educated Patient;Caregiver(s);Other (comment)    Methods Explanation;Demonstration;Handout    Comprehension Verbalized understanding;Returned demonstration;Need further instruction;Verbal cues required               OT Long Term Goals - 09/21/20 1725      OT LONG TERM GOAL #1   Title With maximum caregiver assistance Pt will be able to apply knee length, multi-layer, short stretch compression wraps daily from ankle to tibial tuberosity on one leg at a time using correct gradient techniques to return affected limb/s, as closely as possible, to premorbid size and shape, to limit leg pain and infection risk, and to improve safe functional mobility and ADLs performance.    Baseline dependent    Time 4    Period Days    Status Partially Met      OT LONG TERM GOAL #2   Title With Maximum caregiver assistance Pt will be able to verbalize signs and symptoms of cellulitis infection and identify lymphedema precautions using printed resource (modified independence) for reference to decrease infection risk and limit LE progression over time.    Baseline dependent    Time 6    Period Days    Status Partially Met      OT LONG TERM GOAL #3   Title With Maximum caregiver assistance Pt >85 % compliant with all daily, LE self-care protocols for home program, including simple self-manual lymphatic drainage (MLD), skin care, lymphatic pumping ther ex, skin care, and compression wraps and garments  to limit LE progression and further functional decline.    Baseline dependent    Time 6    Period Weeks    Status Partially Met      OT LONG TERM GOAL #4   Baseline dependent    Time 6    Period Weeks    Status Partially Met      OT LONG TERM GOAL #5   Title Pt to tolerate compression wraps/ garments in keeping w/ prescribed daily wear regime within 1 week of issue date to progress and retain clinical and functional gains and to limit LE progression.    Baseline dependent    Time  6    Period Weeks    Status Partially Met      OT LONG TERM GOAL #6   Title Pt to achieve at least 10% LLE limb volume reduction bilaterally during modified Intensive Phase CDT to reduce infection risk, risk of recurrent leg wounds, to improve functional performance of basic and instrumental ADLs, and to limit LE progression.    Baseline dependent    Time 6    Period Weeks    Status Partially  Met                 Plan - 10/20/20 1615    Clinical Impression Statement Pt tolerated LLE MLD, skin care and reapplication of knee length gradient compression wraps without pain. Condition stable at present without signs/ symptomes of in fection or wounds, despite minimal change in skin condition and swelling since last visit. Cont as per POC.    Occupational Profile and client history currently impacting functional performance dimensia, inconsistent CG assistance with removing wraps, providing skin care and reapplying compression between clinical visits. S    Occupational performance deficits (Please refer to evaluation for details): ADL's;IADL's;Rest and Sleep;Play;Leisure;Social Participation;Other   body image   Rehab Potential Fair    Clinical Decision Making Multiple treatment options, significant modification of task necessary    Comorbidities Affecting Occupational Performance: Presence of comorbidities impacting occupational performance    Modification or Assistance to Complete Evaluation  Max significant modification of tasks or assist is necessary to complete    OT Frequency 1x / week    OT Duration 6 weeks   and PRN   OT Treatment/Interventions Self-care/ADL training;DME and/or AE instruction;Manual lymph drainage;Patient/family education;Compression bandaging;Therapeutic exercise;Manual Therapy;Other (comment)    Plan skin care to decrease infection risk and improve hydration    Recommended Other Services fit with adjustable , alternative compression wraps for optimal independence  w/ donning and doffing. Cnsider CircAid with footpieces.    Consulted and Agree with Plan of Care Patient;Family member/caregiver           Patient will benefit from skilled therapeutic intervention in order to improve the following deficits and impairments:           Visit Diagnosis: Lymphedema, not elsewhere classified    Problem List Patient Active Problem List   Diagnosis Date Noted  . Medication noncompliance due to cognitive impairment 10/17/2019  . Chronic pain of multiple joints 08/01/2017  . Age-related osteoporosis without current pathological fracture 08/01/2017  . Left knee pain 09/19/2016  . Rectal benign neoplasm   . Asthma, chronic 04/04/2015  . history of Tubular adenoma of colon 04/04/2015  . CKD (chronic kidney disease) stage 3, GFR 30-59 ml/min (HCC) 04/03/2015  . Edema 04/14/2014  . Chronic systolic heart failure ([place]) 11/20/2013  . Lymphedema of lower extremity 10/17/2013  . 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[place]) 04/04/2013  . MGUS (monoclonal gammopathy of unknown significance) 05/15/2012  . Vitamin D deficiency 01/29/2010  . DERMATITIS 09/30/2009  . Paranoid delusion ([place]) 08/02/2008  . Obstructive chronic bronchitis ([place]) 08/02/2008  . Essential hypertension 11/11/2007  . Venous (peripheral) insufficiency 11/11/2007  . RENAL CALCULUS 11/11/2007  . LOW BACK PAIN, CHRONIC 11/11/2007    Andrey Spearman, MS, OTR/L, [facility] 10/20/20 4:17 PM   [place] MAIN [facility] SERVICES [address] [place], [place], [address] Phone: [phone]   Fax:  [phone]  Name: TYERRA LORETTO MRN: [id number] Date of Birth: 08/26/1932

## 2020-11-01 ENCOUNTER — Ambulatory Visit: Payer: Medicare Other | Admitting: Occupational Therapy

## 2020-11-04 ENCOUNTER — Other Ambulatory Visit: Payer: Self-pay | Admitting: *Deleted

## 2020-11-04 DIAGNOSIS — D509 Iron deficiency anemia, unspecified: Secondary | ICD-10-CM

## 2020-11-04 MED ORDER — FEROSUL 325 (65 FE) MG PO TABS: 325.0000 mg | ORAL_TABLET | Freq: Every day | ORAL | 0 refills | Status: DC

## 2020-11-04 NOTE — Telephone Encounter (Signed)
Drema Pry, requested refill.

## 2020-11-23 ENCOUNTER — Encounter: Payer: Self-pay | Admitting: Occupational Therapy

## 2020-11-23 NOTE — Addendum Note (Signed)
Addended by: Ansel Bong on: 11/23/2020 05:51 PM   Modules accepted: Orders

## 2020-11-23 NOTE — Therapy (Deleted)
[facility] REH[facility] SERVICES [address] [place], [place], [address] Phone: [phone]   Fax:  [phone]  Occupational Therapy Treatment  Patient Details  Name: [name] MRN: [id number] Date of Birth: 06/08/1932 Referring Provider (OT): Dinah C Ngetich, NP   Encounter Date: 10/04/2020    Past Medical History:  Diagnosis Date  . Anemia, unspecified   . Anxiety   . Arthritis   . Asthma   . Delusions (HCC)   . Depression   . Edema 2005   "since put the glass in them"   . GERD (gastroesophageal reflux disease)   . Lumbago   . Lymphedema   . Obstructive chronic bronchitis without exacerbation (HCC)   . Other abnormal glucose   . 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 Systolic heart failure (HCC)    EF 35 to 40% per echo January 2015  . Unspecified essential hypertension   . Unspecified nonpsychotic mental disorder   . Unspecified venous (peripheral) insufficiency   . Unspecified venous (peripheral) insufficiency   . Unspecified vitamin D deficiency     Past Surgical History:  Procedure Laterality Date  . APPENDECTOMY    . CERVICAL SPINE SURGERY     x 2  . CHOLECYSTECTOMY    . ESOPHAGOGASTRODUODENOSCOPY N/A 04/05/2015   Procedure: ESOPHAGOGASTRODUODENOSCOPY (EGD);  Surgeon: Malcolm T Stark, MD;  Location: [facility];  Service: Endoscopy;  Laterality: N/A;  . FLEXIBLE SIGMOIDOSCOPY N/A 04/05/2015   Procedure: FLEXIBLE SIGMOIDOSCOPY;  Surgeon: Malcolm T Stark, MD;  Location: [facility];  Service: Endoscopy;  Laterality: N/A;  . HAND SURGERY    . LUMBAR DISC SURGERY    . PARTIAL HYSTERECTOMY    . TONSILLECTOMY      There were no vitals filed for this visit.                              OT Long Term Goals - 10/04/20 1713      OT LONG TERM GOAL #1   Title With maximum caregiver assistance Pt will be able to apply knee length, multi-layer, short stretch compression wraps daily from ankle to  tibial tuberosity on one leg at a time using correct gradient techniques to return affected limb/s, as closely as possible, to premorbid size and shape, to limit leg pain and infection risk, and to improve safe functional mobility and ADLs performance.    Baseline dependent    Time 4    Period Days    Status Not Met   To date caregiver assistance with compression wrapping, skin care, self MLD and ther ex has been available daily at beginning of episode, but relative has returned home and at present no one is assisting Pt LE home program between visits.   Target Date 12/28/20      OT LONG TERM GOAL #2   Title With Maximum caregiver assistance Pt will be able to verbalize signs and symptoms of cellulitis infection and identify lymphedema precautions using printed resource (modified independence) for reference to decrease infection risk and limit LE progression over time.    Baseline dependent    Time 6    Period Days    Status Partially Met    Target Date 12/28/20      OT LONG TERM GOAL #3   Title With Maximum caregiver assistance Pt >85 % compliant with all daily, LE self-care protocols for home program, including simple self-manual lymphatic drainage (MLD),   skin care, lymphatic pumping ther ex, skin care, and compression wraps and garments  to limit LE progression and further functional decline.    Baseline dependent    Time 6    Period Weeks    Status Not Met   Pt has no assistance with LE home program, and she is unable to consistently and correctly perform skin care, ther ex, simple self MLD and apply wraps since she is unable to reach her feet.   Target Date 12/28/20      OT LONG TERM GOAL #4   Baseline dependent    Time 6    Period Weeks    Status Not Met    Target Date 12/28/20      OT LONG TERM GOAL #5   Title Pt to tolerate compression wraps/ garments in keeping w/ prescribed daily wear regime within 1 week of issue date to progress and retain clinical and functional gains and  to limit LE progression.    Baseline dependent    Time 6    Period Weeks    Status Partially Met   met for LLE   Target Date 12/28/20      OT LONG TERM GOAL #6   Title Pt to achieve at least 10% LLE limb volume reduction bilaterally during modified Intensive Phase CDT to reduce infection risk, risk of recurrent leg wounds, to improve functional performance of basic and instrumental ADLs, and to limit LE progression.    Baseline dependent    Time 6    Period Weeks    Status Partially Met   09/28/20 LLE belwo the knee (A-D) decreased by 8.4%. RLE not yet treated.   Target Date 12/28/20                  Patient will benefit from skilled therapeutic intervention in order to improve the following deficits and impairments:           Visit Diagnosis: Lymphedema, not elsewhere classified - Plan: Ot plan of care cert/re-cert    Problem List Patient Active Problem List   Diagnosis Date Noted  . Medication noncompliance due to cognitive impairment 10/17/2019  . Chronic pain of multiple joints 08/01/2017  . Age-related osteoporosis without current pathological fracture 08/01/2017  . Left knee pain 09/19/2016  . Rectal benign neoplasm   . Asthma, chronic 04/04/2015  . history of Tubular adenoma of colon 04/04/2015  . CKD (chronic kidney disease) stage 3, GFR 30-59 ml/min (HCC) 04/03/2015  . Edema 04/14/2014  . Chronic systolic heart failure ([place]) 11/20/2013  . Lymphedema of lower extremity 10/17/2013  . 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[place]) 04/04/2013  . MGUS (monoclonal gammopathy of unknown significance) 05/15/2012  . Vitamin D deficiency 01/29/2010  . DERMATITIS 09/30/2009  . Paranoid delusion ([place]) 08/02/2008  . Obstructive chronic bronchitis ([place]) 08/02/2008  . Essential hypertension 11/11/2007  . Venous (peripheral) insufficiency 11/11/2007  . RENAL CALCULUS 11/11/2007  . LOW BACK PAIN, CHRONIC 11/11/2007    Ansel Bong 11/23/2020, 5:36 PM  [place] MAIN [facility] SERVICES [address] [place], [place], [address] Phone: [phone]   Fax:  [phone]  Name: MAELIE CHRISWELL MRN: [id number] Date of Birth: 08-04-1932

## 2020-11-23 NOTE — Therapy (Addendum)
[place] MAIN [facility] SERVICES [address] [place], [place], [address] Phone: [phone]   Fax:  [phone]  Occupational Therapy Treatment  Patient Details  Name: [name] MRN: [id number] Date of Birth: 1932-01-25 Referring Provider (OT): Sandrea Hughs, NP   Encounter Date: 10/04/2020   OT End of Session 10/04/20 1636             Visit Number 12        Number of Visits 36        Date for OT Re-Evaluation 12/28/20                     OT Start Time 0100        OT Stop Time 0205        OT Time Calculation (min) 65 min                     Activity Tolerance Patient tolerated treatment well;No increased pain        Behavior During Therapy WFL for tasks assessed/performed         Past Medical History:  Diagnosis Date  . Anemia, unspecified    . Anxiety    . Arthritis    . Asthma    . Delusions ([facility])    . Depression    . Edema 2005    "since put the glass in them"   . GERD (gastroesophageal reflux disease)    . Lumbago    . Lymphedema    . Obstructive chronic bronchitis without exacerbation ([place])    . Other abnormal glucose    . 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[place]) 04/04/2013  . Systolic heart failure (HCC)      EF 35 to 40% per echo January 2015  . Unspecified essential hypertension    . Unspecified nonpsychotic mental disorder    . Unspecified venous (peripheral) insufficiency    . Unspecified venous (peripheral) insufficiency    . Unspecified vitamin D deficiency             Past Surgical History:  Procedure Laterality Date  . APPENDECTOMY      . CERVICAL SPINE SURGERY        x 2  . CHOLECYSTECTOMY      . ESOPHAGOGASTRODUODENOSCOPY N/A 04/05/2015    Procedure: ESOPHAGOGASTRODUODENOSCOPY (EGD);  Surgeon: [name], MD;  Location: [facility];  Service: Endoscopy;  Laterality: N/A;  . FLEXIBLE SIGMOIDOSCOPY N/A 04/05/2015    Procedure: FLEXIBLE SIGMOIDOSCOPY;  Surgeon: [name], MD;  Location: [facility];   Service: Endoscopy;  Laterality: N/A;  . HAND SURGERY      . LUMBAR DISC SURGERY      . PARTIAL HYSTERECTOMY      . TONSILLECTOMY      Subjective Assessment 10/04/20 1738 10/04/20 1201            Subjective  [name] presents for OT Rx visit 12/36 to address BLE lymphedema. Pt is accompanied by her nephew, [name]. Pt reports LE related pain is unchanged today. Pt reports she has worn the same compression wrap applied at last visit        Patient is accompanied by: Family member        Pertinent History Chronic obstructive bronchitis, CVI, anxiety , systolic heart failure, asthma, arthritis, multiple spinal surgeries w/ chronic back pain, DM,        Limitations Difficulty walking, impaired standing tolerance, impaired transfers, decreased joint  AROM at toes, ankles and knees, chronic leg swelling , chronic pain. increased infection and falls risks, cognitive impairment, generalized weakness        Repetition Increases Symptoms        Special Tests + Stemmer bilaterally        Patient Stated Goals reduce swelling and legs feel better                     Pain Onset Other (comment)  >20 years          Treatment: Edema Management Skin care throughout MLD utilizing low ph castor oil to increase hydration and tissue flexibility, and to decrease infection risk. MLD to RLE utilizing short neck, abdominal sequence, functional inguinal LN , and proximal to distal J strokes  To thigh, knee, leg, ankle and foot, ending with retrograde sequence from foot back to terminus. Gradient compression wrap   , knee length, to R leg from base of tibial tuberosity utilizing 8,10 and 12 cm wide short stretch wraps in gradient pattern over 0.4 cm thick Rosidal foam and cotton stockinet.        OT Long Term Goals - 10/04/20 1739                OT LONG TERM GOAL #1     Title With maximum caregiver assistance Pt will be able to apply knee length, multi-layer, short stretch compression wraps daily from  ankle to tibial tuberosity on one leg at a time using correct gradient techniques to return affected limb/s, as closely as possible, to premorbid size and shape, to limit leg pain and infection risk, and to improve safe functional mobility and ADLs performance.      Baseline dependent      Time 4      Period Days      Status Not Met   To date caregiver assistance with compression wrapping, skin care, self MLD and ther ex has been available daily at beginning of episode, but relative has returned home and at present no one is assisting Pt LE home program between visits.           OT LONG TERM GOAL #2     Title With Maximum caregiver assistance Pt will be able to verbalize signs and symptoms of cellulitis infection and identify lymphedema precautions using printed resource (modified independence) for reference to decrease infection risk and limit LE progression over time.      Baseline dependent      Time 6      Period Days      Status Partially Met            OT LONG TERM GOAL #3     Title With Maximum caregiver assistance Pt >85 % compliant with all daily, LE self-care protocols for home program, including simple self-manual lymphatic drainage (MLD), skin care, lymphatic pumping ther ex, skin care, and compression wraps and garments  to limit LE progression and further functional decline.      Baseline dependent      Time 6      Period Weeks      Status Not Met   Pt has no assistance with LE home program, and she is unable to consistently and correctly perform skin care, ther ex, simple self MLD and apply wraps since she is unable to reach her feet.           OT LONG TERM GOAL #4  Baseline dependent      Time 6      Period Weeks      Status Not Met            OT LONG TERM GOAL #5     Title Pt to tolerate compression wraps/ garments in keeping w/ prescribed daily wear regime within 1 week of issue date to progress and retain clinical and functional gains and to limit LE progression.       Baseline dependent      Time 6      Period Weeks      Status Partially Met   met for LLE           OT LONG TERM GOAL #6     Title Pt to achieve at least 10% LLE limb volume reduction bilaterally during modified Intensive Phase CDT to reduce infection risk, risk of recurrent leg wounds, to improve functional performance of basic and instrumental ADLs, and to limit LE progression.      Baseline dependent      Time 6      Period Weeks      Status Partially Met   09/28/20 LLE belwo the knee (A-D) decreased by 8.4%. RLE not yet treated.      Plan   Row Name 10/04/20 1631     Clinical Impression Statement Pt and family edu stressed importance of removing compression wraps daily to inspect and bathe skin to reduce infection risk, and then to reapply wraps for optimal limb volume reduction. Pt frequently leaves bandages in place for several days. Once removed caregiver assistance for reapplication is unavailable. Reviewed this important element of consistent , daily LE self care while providing MLD, skin care and re-wrapping LLE. Pt tolerated all apsects of manual without increased pain today. To date Pt has partically achieved the 10% limb volume reduction goal with 8 % for the LLE below the knee. She demonstrates limited compliance with LE self care home program due to limited caregiver assistance. Tissue integrity is improved in terms of increased skin hydration and closed open areas, but fibrosis remains unchanged. Refer to goals updated this visit for detailed progress to date. Cont as per POC.     Occupational Profile and client history currently impacting functional performance dimensia, inconsistent CG assistance with removing wraps, providing skin care and reapplying compression between clinical visits. S     Occupational performance deficits (Please refer to evaluation for details): ADL's;IADL's;Rest and Sleep;Play;Leisure;Social Participation;Other  body image     Pt will benefit from skilled  therapeutic intervention in order to improve on the following performance deficits Body Structure / Function / Physical Skills     Rehab Potential Fair     Clinical Decision Making High     Comorbidities Affecting Occupational Performance: Presence of comorbidities impacting occupational performance     Modification or Assistance to Complete Evaluation  Max significant modification of tasks or assist is necessary to complete     OT Frequency 1x / week     OT Duration 6 weeks  and PRN     OT Treatment/Interventions Self-care/ADL training;DME and/or AE instruction;Manual lymph drainage;Patient/family education;Compression bandaging;Therapeutic exercise;Manual Therapy;Other (comment)     Plan skin care to decrease infection risk and improve hydration     Recommended Other Services fit with adjustable , alternative compression wraps for optimal independence w/ donning and doffing. Cnsider CircAid with footpieces.     Consulted and Agree with Plan of Care Patient;Family member/caregiver  Patient will benefit from skilled therapeutic intervention in order to improve the following deficits and impairments:       Visit Diagnosis: Lymphedema, not elsewhere classified - Plan: Ot plan of care cert/re-cert     Problem List     Patient Active Problem List    Diagnosis Date Noted  . Medication noncompliance due to cognitive impairment 10/17/2019  . Chronic pain of multiple joints 08/01/2017  . Age-related osteoporosis without current pathological fracture 08/01/2017  . Left knee pain 09/19/2016  . Rectal benign neoplasm    . Asthma, chronic 04/04/2015  . history of Tubular adenoma of colon 04/04/2015  . CKD (chronic kidney disease) stage 3, GFR 30-59 ml/min (HCC) 04/03/2015  . Edema 04/14/2014  . Chronic systolic heart failure ([place]) 11/20/2013  . Lymphedema of lower extremity 10/17/2013  . 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[place]) 04/04/2013  . MGUS (monoclonal gammopathy of unknown significance)  05/15/2012  . Vitamin D deficiency 01/29/2010  . DERMATITIS 09/30/2009  . Paranoid delusion ([place]) 08/02/2008  . Obstructive chronic bronchitis ([place]) 08/02/2008  . Essential hypertension 11/11/2007  . Venous (peripheral) insufficiency 11/11/2007  . RENAL CALCULUS 11/11/2007  . LOW BACK PAIN, CHRONIC 11/11/2007      Andrey Spearman, MS, OTR/L, [facility] 11/24/20 4:29 PM   [place] MAIN [facility] SERVICES [address] [place], [place], [address] Phone: [phone]   Fax:  [phone]  Name: [name] MRN: [id number] Date of Birth: 1932-05-06

## 2020-11-29 ENCOUNTER — Ambulatory Visit: Payer: Medicare Other

## 2020-12-01 ENCOUNTER — Ambulatory Visit: Payer: Medicare Other | Attending: Family | Admitting: Occupational Therapy

## 2020-12-01 ENCOUNTER — Other Ambulatory Visit: Payer: Self-pay | Admitting: Nurse Practitioner

## 2020-12-01 ENCOUNTER — Other Ambulatory Visit: Payer: Self-pay

## 2020-12-01 DIAGNOSIS — I890 Lymphedema, not elsewhere classified: Secondary | ICD-10-CM | POA: Diagnosis not present

## 2020-12-01 DIAGNOSIS — F22 Delusional disorders: Secondary | ICD-10-CM

## 2020-12-01 NOTE — Therapy (Signed)
[place] MAIN [facility] SERVICES [address] [place], [place], [address] Phone: [phone]   Fax:  [phone]  Occupational Therapy Treatment  Patient Details  Name: [name] MRN: [id number] Date of Birth: 1932-07-23 Referring Provider (OT): Sandrea Hughs, NP   Encounter Date: 12/01/2020   OT End of Session - 12/01/20 1501    Visit Number 14    Number of Visits 36    Date for OT Re-Evaluation 03/01/21    Authorization Time Period 12/01/20 - 03-01-21    OT Start Time 0300    OT Stop Time 0415    OT Time Calculation (min) 75 min    Activity Tolerance Patient tolerated treatment well;No increased pain    Behavior During Therapy WFL for tasks assessed/performed           Past Medical History:  Diagnosis Date  . Anemia, unspecified   . Anxiety   . Arthritis   . Asthma   . Delusions ([place])   . Depression   . Edema 2005   "since put the glass in them"   . GERD (gastroesophageal reflux disease)   . Lumbago   . Lymphedema   . Obstructive chronic bronchitis without exacerbation ([place])   . Other abnormal glucose   . 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[place]) 04/04/2013  . Systolic heart failure (HCC)    EF 35 to 40% per echo January 2015  . Unspecified essential hypertension   . Unspecified nonpsychotic mental disorder   . Unspecified venous (peripheral) insufficiency   . Unspecified venous (peripheral) insufficiency   . Unspecified vitamin D deficiency     Past Surgical History:  Procedure Laterality Date  . APPENDECTOMY    . CERVICAL SPINE SURGERY     x 2  . CHOLECYSTECTOMY    . ESOPHAGOGASTRODUODENOSCOPY N/A 04/05/2015   Procedure: ESOPHAGOGASTRODUODENOSCOPY (EGD);  Surgeon: [name], MD;  Location: [facility];  Service: Endoscopy;  Laterality: N/A;  . FLEXIBLE SIGMOIDOSCOPY N/A 04/05/2015   Procedure: FLEXIBLE SIGMOIDOSCOPY;  Surgeon: [name], MD;  Location: [facility];  Service: Endoscopy;  Laterality: N/A;  . HAND SURGERY     . LUMBAR DISC SURGERY    . PARTIAL HYSTERECTOMY    . TONSILLECTOMY      There were no vitals filed for this visit.   Subjective Assessment - 12/01/20 1500    Subjective  [name] presents for OT Rx visit 14/36 to address BLE lymphedma. Pt is accompanied by her nephew, [name]. Pt was last seen for lymphedema care on 10/20/20. Pt and nephew do not provide reason why she has been out of CDT since Xmas when plan and schedule is for weekly Rx. Pt reports 7/10 leh pain. "I hurt all the time."    Patient is accompanied by: Family member    Pertinent History Chronic obstructive bronchitis, CVI, anxiety , systoloc heart failure, asthma, arthritis, multiple spinal surgeries w/ chronic back pain, DM,    Limitations Difficulty walking, impaired standing tolerance, impaired transfers, decreased joint AROM at toes, ankles and knees, chronic leg swelling , chronic pain. increased infection and falls risks, cognitive impairment, generaized weakness    Repetition Increases Symptoms    Special Tests + Stemmer bilaterally    Patient Stated Goals reduce swelling and legs feel better    Pain Onset Other (comment)   >20 years                       OT  Treatments/Exercises (OP) - 12/01/20 1659      ADLs   ADL Education Given Yes      Manual Therapy   Manual Therapy Edema management;Manual Lymphatic Drainage (MLD);Compression Bandaging    Manual therapy comments skin care with low ph castor oil throughout manual therapy to increase skin hydration and flexibility, and to limit infection risk    Manual Lymphatic Drainage (MLD) MLD to LLE as established. Pants worn to clinic severly limit access to inguinal LN      for proximal MLD.    Compression Bandaging Multilayer, gradient compression wraps applied using 1 each of short stretch bandage 8, 10 and 12 cm wide over single layer of fRosidal oam and soft cotton stockinett. from base of toes to tibial tuberosity                  OT  Education - 12/01/20 1702    Education provided Yes    Education Details Continued skilled Pt/caregiver education  And LE ADL training throughout visit for lymphedema self care/ home program, including compression wrapping, compression garment and device wear/care, lymphatic pumping ther ex, simple self-MLD, and skin care. Discussed progress towards goals.    Person(s) Educated Patient;Caregiver(s);Other (comment)    Methods Explanation;Demonstration;Handout    Comprehension Verbalized understanding;Returned demonstration;Need further instruction               OT Long Term Goals - 12/01/20 1711      OT LONG TERM GOAL #1   Title With maximum caregiver assistance Pt will be able to apply knee length, multi-layer, short stretch compression wraps daily from ankle to tibial tuberosity on one leg at a time using correct gradient techniques to return affected limb/s, as closely as possible, to premorbid size and shape, to limit leg pain and infection risk, and to improve safe functional mobility and ADLs performance.    Baseline dependent    Time 4    Period Days    Status Not Met   To date caregiver assistance with compression wrapping, skin care, self MLD and ther ex has been available daily at beginning of episode, but relative has returned home and at present no one is assisting Pt LE home program between visits.   Target Date 12/28/20      OT LONG TERM GOAL #2   Title With Maximum caregiver assistance Pt will be able to verbalize signs and symptoms of cellulitis infection and identify lymphedema precautions using printed resource (modified independence) for reference to decrease infection risk and limit LE progression over time.    Baseline dependent    Time 6    Period Days    Status Partially Met    Target Date 12/28/20      OT LONG TERM GOAL #3   Title With Maximum caregiver assistance Pt >85 % compliant with all daily, LE self-care protocols for home program, including simple  self-manual lymphatic drainage (MLD), skin care, lymphatic pumping ther ex, skin care, and compression wraps and garments  to limit LE progression and further functional decline.    Baseline dependent    Time 6    Period Weeks    Status On-going   difficult to assess. No caregiver at home. Pt lives alone.   Target Date 12/28/20      OT LONG TERM GOAL #4   Baseline dependent    Time 6    Period Weeks    Status Not Met    Target Date 12/28/20  OT LONG TERM GOAL #5   Title Pt to tolerate compression wraps/ garments in keeping w/ prescribed daily wear regime within 1 week of issue date to progress and retain clinical and functional gains and to limit LE progression.    Baseline dependent    Time 6    Period Weeks    Status Achieved   met for LLE     OT LONG TERM GOAL #6   Title Pt to achieve at least 10% LLE limb volume reduction bilaterally during modified Intensive Phase CDT to reduce infection risk, risk of recurrent leg wounds, to improve functional performance of basic and instrumental ADLs, and to limit LE progression.    Baseline dependent    Time 6    Period Weeks    Status Partially Met   09/28/20 LLE belwo the knee (A-D) decreased by 8.4%. RLE not yet treated.   Target Date 12/28/20                 Plan - 12/01/20 1704    Clinical Impression Statement Pt tolerated LLE MLD, skin care and application of knee length gradient compression wraps without pain. Pt encouraged again not to leave wraps in place for more than 2 days MAX, without inspecting skin, bathing legs, and applying low ph lotion. I suspect Pt leaves wraps in place longer than 2 days. Pt lives alone and CG is not present to re-apply wraps on recommended 24 hour schedule. Skin hydration remains chronically dry and flaking with hperkeritosis on feet. Condition appears stable at present without signs/ symptomes of infection or wounds, despite chronically poor skin condition, intractable limb swelling and  intermittent compression. Cont at 1 x weekly frequency. Cont working to coordinate visit with [employer] to assist with fitting existing adjustable leg wraps Pt came back to treatment with. At present they do not fit and are not used.    OT Occupational Profile and History Comprehensive Assessment- Review of records and extensive additional review of physical, cognitive, psychosocial history related to current functional performance    Occupational Profile and client history currently impacting functional performance dimensia, inconsistent CG assistance with removing wraps, providing skin care and reapplying compression between clinical visits. S    Occupational performance deficits (Please refer to evaluation for details): ADL's;IADL's;Rest and Sleep;Play;Leisure;Social Participation;Other   body image   Body Structure / Function / Physical Skills ADL;Decreased knowledge of use of DME;Pain;Edema;Scar mobility;Decreased knowledge of precautions;Skin integrity    Rehab Potential Fair    Clinical Decision Making Multiple treatment options, significant modification of task necessary    Comorbidities Affecting Occupational Performance: Presence of comorbidities impacting occupational performance    Modification or Assistance to Complete Evaluation  Max significant modification of tasks or assist is necessary to complete    OT Frequency 1x / week    OT Duration 12 weeks   Pt will benefit from ongoing OT support for lymphedema self management to limit progression and further functional decline as she is unable to reach feet to perform self care and has very limited caregiver assistance   OT Treatment/Interventions Self-care/ADL training;DME and/or AE instruction;Manual lymph drainage;Patient/family education;Compression bandaging;Therapeutic exercise;Manual Therapy;Other (comment)    Plan skin care to decrease infection risk and improve hydration    Recommended Other Services fit with adjustable ,  alternative compression wraps for optimal independence w/ donning and doffing. Cnsider CircAid with footpieces.    Consulted and Agree with Plan of Care Patient;Family member/caregiver  Patient will benefit from skilled therapeutic intervention in order to improve the following deficits and impairments:   Body Structure / Function / Physical Skills: ADL,Decreased knowledge of use of DME,Pain,Edema,Scar mobility,Decreased knowledge of precautions,Skin integrity       Visit Diagnosis: Lymphedema, not elsewhere classified - Plan: Ot plan of care cert/re-cert    Problem List Patient Active Problem List   Diagnosis Date Noted  . Medication noncompliance due to cognitive impairment 10/17/2019  . Chronic pain of multiple joints 08/01/2017  . Age-related osteoporosis without current pathological fracture 08/01/2017  . Left knee pain 09/19/2016  . Rectal benign neoplasm   . Asthma, chronic 04/04/2015  . history of Tubular adenoma of colon 04/04/2015  . CKD (chronic kidney disease) stage 3, GFR 30-59 ml/min (HCC) 04/03/2015  . Edema 04/14/2014  . Chronic systolic heart failure ([place]) 11/20/2013  . Lymphedema of lower extremity 10/17/2013  . 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 MGUS (monoclonal gammopathy of unknown significance) 05/15/2012  . Vitamin D deficiency 01/29/2010  . DERMATITIS 09/30/2009  . Paranoid delusion ([place]) 08/02/2008  . Obstructive chronic bronchitis ([place]) 08/02/2008  . Essential hypertension 11/11/2007  . Venous (peripheral) insufficiency 11/11/2007  . RENAL CALCULUS 11/11/2007  . LOW BACK PAIN, CHRONIC 11/11/2007    Andrey Spearman, MS, OTR/L, [facility] 12/01/20 5:21 PM   [place] MAIN [facility] SERVICES [address] [place], [place], [address] Phone: [phone]   Fax:  [phone]  Name: REID NAWROT MRN: [id number] Date of Birth: July 11, 1932

## 2020-12-08 ENCOUNTER — Other Ambulatory Visit: Payer: Self-pay

## 2020-12-08 ENCOUNTER — Ambulatory Visit: Payer: Medicare Other | Admitting: Occupational Therapy

## 2020-12-08 DIAGNOSIS — I890 Lymphedema, not elsewhere classified: Secondary | ICD-10-CM | POA: Diagnosis not present

## 2020-12-08 NOTE — Therapy (Signed)
[place] MAIN [facility] SERVICES [address] [place], [place], [address] Phone: [phone]   Fax:  [phone]  Occupational Therapy Treatment  Patient Details  Name: [name] MRN: [id number] Date of Birth: [birth year] Referring Provider (OT): Sandrea Hughs, NP   Encounter Date: 12/08/2020   OT End of Session - 12/08/20 1601    Visit Number 15    Number of Visits 36    Date for OT Re-Evaluation 03/01/21    Authorization Time Period 12/01/20 - 03-01-21    OT Start Time 0230    OT Stop Time 0340    OT Time Calculation (min) 70 min    Activity Tolerance Patient tolerated treatment well;No increased pain    Behavior During Therapy WFL for tasks assessed/performed           Past Medical History:  Diagnosis Date  . Anemia, unspecified   . Anxiety   . Arthritis   . Asthma   . Delusions ([place])   . Depression   . Edema 2005   "since put the glass in them"   . GERD (gastroesophageal reflux disease)   . Lumbago   . Lymphedema   . Obstructive chronic bronchitis without exacerbation ([place])   . Other abnormal glucose   . 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[place]) 04/04/2013  . Systolic heart failure (HCC)    EF 35 to 40% per echo January 2015  . Unspecified essential hypertension   . Unspecified nonpsychotic mental disorder   . Unspecified venous (peripheral) insufficiency   . Unspecified venous (peripheral) insufficiency   . Unspecified vitamin D deficiency     Past Surgical History:  Procedure Laterality Date  . APPENDECTOMY    . CERVICAL SPINE SURGERY     x 2  . CHOLECYSTECTOMY    . ESOPHAGOGASTRODUODENOSCOPY N/A 04/05/2015   Procedure: ESOPHAGOGASTRODUODENOSCOPY (EGD);  Surgeon: [name], MD;  Location: [facility];  Service: Endoscopy;  Laterality: N/A;  . FLEXIBLE SIGMOIDOSCOPY N/A 04/05/2015   Procedure: FLEXIBLE SIGMOIDOSCOPY;  Surgeon: [name], MD;  Location: [facility];  Service: Endoscopy;  Laterality: N/A;  . HAND SURGERY     . LUMBAR DISC SURGERY    . PARTIAL HYSTERECTOMY    . TONSILLECTOMY      There were no vitals filed for this visit.   Subjective Assessment - 12/08/20 1436    Subjective  [name] presents for OT Rx visit 15/36 to address BLE lymphedma. Pt is accompanied by her nephew, [name]. Pt reports 6-7/10 leg pain this afternoon. She reports ongoing back pain.    Patient is accompanied by: Family member    Pertinent History Chronic obstructive bronchitis, CVI, anxiety , systoloc heart failure, asthma, arthritis, multiple spinal surgeries w/ chronic back pain, DM,    Limitations Difficulty walking, impaired standing tolerance, impaired transfers, decreased joint AROM at toes, ankles and knees, chronic leg swelling , chronic pain. increased infection and falls risks, cognitive impairment, generaized weakness    Repetition Increases Symptoms    Special Tests + Stemmer bilaterally    Patient Stated Goals reduce swelling and legs feel better    Pain Onset Other (comment)   >20 years                       OT Treatments/Exercises (OP) - 12/08/20 1602      ADLs   ADL Education Given Yes      Manual Therapy   Manual Therapy Edema management;Manual Lymphatic  Drainage (MLD);Compression Bandaging;Other (comment)    Manual Lymphatic Drainage (MLD) MLD to LLE as established. Pants worn to clinic severly limit access to inguinal LN      for proximal MLD.    Compression Bandaging Multilayer, gradient compression wraps applied using 1 each of short stretch bandage 8, 10 and 12 cm wide over single layer of fRosidal oam and soft cotton stockinett. from base of toes to tibial tuberosity    Other Manual Therapy skin care throughout MLD to improve hydration and skin flexibility                  OT Education - 12/08/20 1603    Education provided Yes    Education Details Continued Pt/ CG edu for lymphedema self care  and home program throughout session. Topics include multilayer,  gradient compression wrapping, simple self-MLD, therapeutic lymphatic pumping exercises, skin/nail care, risk reduction factors and LE precautions, compression garments/recommendations and wear and care schedule and compression garment donning / doffing using assistive devices. All questions answered to the Pt's satisfaction, and Pt demonstrates understanding by report.    Person(s) Educated Patient;Caregiver(s)    Methods Explanation;Demonstration    Comprehension Verbalized understanding;Returned demonstration;Need further instruction               OT Long Term Goals - 12/01/20 1711      OT LONG TERM GOAL #1   Title With maximum caregiver assistance Pt will be able to apply knee length, multi-layer, short stretch compression wraps daily from ankle to tibial tuberosity on one leg at a time using correct gradient techniques to return affected limb/s, as closely as possible, to premorbid size and shape, to limit leg pain and infection risk, and to improve safe functional mobility and ADLs performance.    Baseline dependent    Time 4    Period Days    Status Not Met   To date caregiver assistance with compression wrapping, skin care, self MLD and ther ex has been available daily at beginning of episode, but relative has returned home and at present no one is assisting Pt LE home program between visits.   Target Date 12/28/20      OT LONG TERM GOAL #2   Title With Maximum caregiver assistance Pt will be able to verbalize signs and symptoms of cellulitis infection and identify lymphedema precautions using printed resource (modified independence) for reference to decrease infection risk and limit LE progression over time.    Baseline dependent    Time 6    Period Days    Status Partially Met    Target Date 12/28/20      OT LONG TERM GOAL #3   Title With Maximum caregiver assistance Pt >85 % compliant with all daily, LE self-care protocols for home program, including simple self-manual  lymphatic drainage (MLD), skin care, lymphatic pumping ther ex, skin care, and compression wraps and garments  to limit LE progression and further functional decline.    Baseline dependent    Time 12    Period Weeks    Status On-going   difficult to assess. No caregiver at home. Pt lives alone.   Target Date 12/28/20      OT LONG TERM GOAL #4   Baseline dependent    Time 12    Period Weeks    Status Not Met    Target Date 12/28/20      OT LONG TERM GOAL #5   Title Pt to tolerate compression wraps/ garments in  keeping w/ prescribed daily wear regime within 1 week of issue date to progress and retain clinical and functional gains and to limit LE progression.    Baseline dependent    Time 12    Period Weeks    Status Achieved   met for LLE     OT LONG TERM GOAL #6   Title Pt to achieve at least 10% LLE limb volume reduction bilaterally during modified Intensive Phase CDT to reduce infection risk, risk of recurrent leg wounds, to improve functional performance of basic and instrumental ADLs, and to limit LE progression.    Baseline dependent    Time 12    Period Weeks    Status Partially Met   09/28/20 LLE belwo the knee (A-D) decreased by 8.4%. RLE not yet treated.   Target Date 12/28/20                 Plan - 12/08/20 1604    Clinical Impression Statement Skin on feet and distal legs continues to be extremely dry and flaking. With increased swelling skin appears more reddened and fragile today. No signs of infection, but , as always, will continue to monitor closely as Pt has a hx of cellulitis and slow healing wounds. Provided MLD, skin care and compression wrap to LLE as established without increased pain. No change in limb volume noted at end of session. . Pt and nephew agree with plan to meet with manufacturer's rep as soon as she is able to troubleshoot adjustable, Velcro closure garments and adjust them for optimal fit. We'll also try to come up with best foot piece  addition that provides gradient decongestion to address chronmic foot swelling. Cont as epr POC.    OT Occupational Profile and History Comprehensive Assessment- Review of records and extensive additional review of physical, cognitive, psychosocial history related to current functional performance    Occupational Profile and client history currently impacting functional performance dimensia, inconsistent CG assistance with removing wraps, providing skin care and reapplying compression between clinical visits. S    Occupational performance deficits (Please refer to evaluation for details): ADL's;IADL's;Rest and Sleep;Play;Leisure;Social Participation;Other   body image   Body Structure / Function / Physical Skills ADL;Decreased knowledge of use of DME;Pain;Edema;Scar mobility;Decreased knowledge of precautions;Skin integrity    Rehab Potential Fair    Clinical Decision Making Multiple treatment options, significant modification of task necessary    Comorbidities Affecting Occupational Performance: Presence of comorbidities impacting occupational performance    Modification or Assistance to Complete Evaluation  Max significant modification of tasks or assist is necessary to complete    OT Frequency 1x / week    OT Duration 12 weeks   Pt will benefit from ongoing OT support for lymphedema self management to limit progression and further functional decline as she is unable to reach feet to perform self care and has very limited caregiver assistance   OT Treatment/Interventions Self-care/ADL training;DME and/or AE instruction;Manual lymph drainage;Patient/family education;Compression bandaging;Therapeutic exercise;Manual Therapy;Other (comment)    Plan skin care to decrease infection risk and improve hydration    Recommended Other Services fit with adjustable , alternative compression wraps for optimal independence w/ donning and doffing. Cnsider CircAid with footpieces.    Consulted and Agree with Plan of  Care Patient;Family member/caregiver           Patient will benefit from skilled therapeutic intervention in order to improve the following deficits and impairments:   Body Structure / Function / Physical Skills: ADL,Decreased knowledge of  use of DME,Pain,Edema,Scar mobility,Decreased knowledge of precautions,Skin integrity       Visit Diagnosis: Lymphedema, not elsewhere classified    Problem List Patient Active Problem List   Diagnosis Date Noted  . Medication noncompliance due to cognitive impairment 10/17/2019  . Chronic pain of multiple joints 08/01/2017  . Age-related osteoporosis without current pathological fracture 08/01/2017  . Left knee pain 09/19/2016  . Rectal benign neoplasm   . Asthma, chronic 04/04/2015  . history of Tubular adenoma of colon 04/04/2015  . CKD (chronic kidney disease) stage 3, GFR 30-59 ml/min (HCC) 04/03/2015  . Edema 04/14/2014  . Chronic systolic heart failure ([place]) 11/20/2013  . Lymphedema of lower extremity 10/17/2013  . 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 MGUS (monoclonal gammopathy of unknown significance) 05/15/2012  . Vitamin D deficiency 01/29/2010  . DERMATITIS 09/30/2009  . Paranoid delusion ([place]) 08/02/2008  . Obstructive chronic bronchitis ([place]) 08/02/2008  . Essential hypertension 11/11/2007  . Venous (peripheral) insufficiency 11/11/2007  . RENAL CALCULUS 11/11/2007  . LOW BACK PAIN, CHRONIC 11/11/2007    Andrey Spearman, MS, OTR/L, [facility] 12/08/20 4:09 PM  [place] MAIN [facility] SERVICES [address] [place], [place], [address] Phone: [phone]   Fax:  [phone]  Name: GLADIS SOLEY MRN: [id number] Date of Birth: July 16, 1932

## 2020-12-13 ENCOUNTER — Other Ambulatory Visit: Payer: Self-pay

## 2020-12-13 ENCOUNTER — Ambulatory Visit: Payer: Medicare Other | Admitting: Occupational Therapy

## 2020-12-13 DIAGNOSIS — I890 Lymphedema, not elsewhere classified: Secondary | ICD-10-CM

## 2020-12-13 NOTE — Therapy (Signed)
[place] MAIN [facility] SERVICES [address] [place], [place], [address] Phone: [phone]   Fax:  [phone]  Occupational Therapy Treatment  Patient Details  Name: [name] MRN: [id number] Date of Birth: 22-Feb-1932 Referring Provider (OT): Sandrea Hughs, NP   Encounter Date: 12/13/2020   OT End of Session - 12/13/20 1522    Visit Number 16    Number of Visits 36    Date for OT Re-Evaluation 03/01/21    Authorization Time Period 12/01/20 - 03-01-21    OT Start Time 0310    OT Stop Time 0408    OT Time Calculation (min) 58 min    Activity Tolerance Patient tolerated treatment well;No increased pain    Behavior During Therapy WFL for tasks assessed/performed           Past Medical History:  Diagnosis Date  . Anemia, unspecified   . Anxiety   . Arthritis   . Asthma   . Delusions ([place])   . Depression   . Edema 2005   "since put the glass in them"   . GERD (gastroesophageal reflux disease)   . Lumbago   . Lymphedema   . Obstructive chronic bronchitis without exacerbation ([place])   . Other abnormal glucose   . 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 Systolic heart failure (HCC)    EF 35 to 40% per echo January 2015  . Unspecified essential hypertension   . Unspecified nonpsychotic mental disorder   . Unspecified venous (peripheral) insufficiency   . Unspecified venous (peripheral) insufficiency   . Unspecified vitamin D deficiency     Past Surgical History:  Procedure Laterality Date  . APPENDECTOMY    . CERVICAL SPINE SURGERY     x 2  . CHOLECYSTECTOMY    . ESOPHAGOGASTRODUODENOSCOPY N/A 04/05/2015   Procedure: ESOPHAGOGASTRODUODENOSCOPY (EGD);  Surgeon: [name], MD;  Location: [facility];  Service: Endoscopy;  Laterality: N/A;  . FLEXIBLE SIGMOIDOSCOPY N/A 04/05/2015   Procedure: FLEXIBLE SIGMOIDOSCOPY;  Surgeon: [name], MD;  Location: [facility];  Service: Endoscopy;  Laterality: N/A;  . HAND SURGERY     . LUMBAR DISC SURGERY    . PARTIAL HYSTERECTOMY    . TONSILLECTOMY      There were no vitals filed for this visit.   Subjective Assessment - 12/13/20 1613    Subjective  [name] presents for OT Rx visit 16/36 to address BLE lymphedma. Pt is accompanied by her nephew, [name]. Pt reports 6-7/10 leg pain this afternoon. She reports ongoing back pain.    Patient is accompanied by: Family member    Pertinent History Chronic obstructive bronchitis, CVI, anxiety , systoloc heart failure, asthma, arthritis, multiple spinal surgeries w/ chronic back pain, DM,    Limitations Difficulty walking, impaired standing tolerance, impaired transfers, decreased joint AROM at toes, ankles and knees, chronic leg swelling , chronic pain. increased infection and falls risks, cognitive impairment, generaized weakness    Repetition Increases Symptoms    Special Tests + Stemmer bilaterally    Patient Stated Goals reduce swelling and legs feel better    Pain Onset Other (comment)   >20 years                       OT Treatments/Exercises (OP) - 12/13/20 1523      ADLs   ADL Education Given Yes      Manual Therapy   Manual Therapy Edema management;Manual Lymphatic  Drainage (MLD);Compression Bandaging;Other (comment)    Manual Lymphatic Drainage (MLD) MLD to LLE as established. Pants worn to clinic severly limit access to inguinal LN      for proximal MLD.    Compression Bandaging Multilayer, gradient compression wraps applied using 1 each of short stretch bandage 8, 10 and 12 cm wide over single layer of fRosidal oam and soft cotton stockinett. from base of toes to tibial tuberosity    Other Manual Therapy skin care throughout MLD to improve hydration and skin flexibility                       OT Long Term Goals - 12/01/20 1711      OT LONG TERM GOAL #1   Title With maximum caregiver assistance Pt will be able to apply knee length, multi-layer, short stretch compression  wraps daily from ankle to tibial tuberosity on one leg at a time using correct gradient techniques to return affected limb/s, as closely as possible, to premorbid size and shape, to limit leg pain and infection risk, and to improve safe functional mobility and ADLs performance.    Baseline dependent    Time 4    Period Days    Status Not Met   To date caregiver assistance with compression wrapping, skin care, self MLD and ther ex has been available daily at beginning of episode, but relative has returned home and at present no one is assisting Pt LE home program between visits.   Target Date 12/28/20      OT LONG TERM GOAL #2   Title With Maximum caregiver assistance Pt will be able to verbalize signs and symptoms of cellulitis infection and identify lymphedema precautions using printed resource (modified independence) for reference to decrease infection risk and limit LE progression over time.    Baseline dependent    Time 6    Period Days    Status Partially Met    Target Date 12/28/20      OT LONG TERM GOAL #3   Title With Maximum caregiver assistance Pt >85 % compliant with all daily, LE self-care protocols for home program, including simple self-manual lymphatic drainage (MLD), skin care, lymphatic pumping ther ex, skin care, and compression wraps and garments  to limit LE progression and further functional decline.    Baseline dependent    Time 12    Period Weeks    Status On-going   difficult to assess. No caregiver at home. Pt lives alone.   Target Date 12/28/20      OT LONG TERM GOAL #4   Baseline dependent    Time 12    Period Weeks    Status Not Met    Target Date 12/28/20      OT LONG TERM GOAL #5   Title Pt to tolerate compression wraps/ garments in keeping w/ prescribed daily wear regime within 1 week of issue date to progress and retain clinical and functional gains and to limit LE progression.    Baseline dependent    Time 12    Period Weeks    Status Achieved   met  for LLE     OT LONG TERM GOAL #6   Title Pt to achieve at least 10% LLE limb volume reduction bilaterally during modified Intensive Phase CDT to reduce infection risk, risk of recurrent leg wounds, to improve functional performance of basic and instrumental ADLs, and to limit LE progression.    Baseline dependent  Time 12    Period Weeks    Status Partially Met   09/28/20 LLE belwo the knee (A-D) decreased by 8.4%. RLE not yet treated.   Target Date 12/28/20                 Plan - 12/13/20 1608    Clinical Impression Statement Pt reports chronic back pain is unchanged today. She winces several times during fibrosis techniques to L foot. Limb swelling is slightly decreased today compared with last visit. Skin continues to sheet off. Signs/ symptoms of infection are absent and leg and foot are dry. Pt tolerated MLD, skin care and cmpression wraps as established.  Condition is not much changed from last visit, but appears stable and no worse. Cont as per POC. Awaiting manufacturer's rep to confirm attendance at upcoming visit to assist with altering and fitting Medi reduction kit, a velcro, wrap style daytime garment alternative recommended by another therapist. Cont OT 1 x weekly as per POC.    OT Occupational Profile and History Comprehensive Assessment- Review of records and extensive additional review of physical, cognitive, psychosocial history related to current functional performance    Occupational Profile and client history currently impacting functional performance dimensia, inconsistent CG assistance with removing wraps, providing skin care and reapplying compression between clinical visits. S    Occupational performance deficits (Please refer to evaluation for details): ADL's;IADL's;Rest and Sleep;Play;Leisure;Social Participation;Other   body image   Body Structure / Function / Physical Skills ADL;Decreased knowledge of use of DME;Pain;Edema;Scar mobility;Decreased knowledge of  precautions;Skin integrity    Rehab Potential Fair    Clinical Decision Making Multiple treatment options, significant modification of task necessary    Comorbidities Affecting Occupational Performance: Presence of comorbidities impacting occupational performance    Modification or Assistance to Complete Evaluation  Max significant modification of tasks or assist is necessary to complete    OT Frequency 1x / week    OT Duration 12 weeks   Pt will benefit from ongoing OT support for lymphedema self management to limit progression and further functional decline as she is unable to reach feet to perform self care and has very limited caregiver assistance   OT Treatment/Interventions Self-care/ADL training;DME and/or AE instruction;Manual lymph drainage;Patient/family education;Compression bandaging;Therapeutic exercise;Manual Therapy;Other (comment)    Plan skin care to decrease infection risk and improve hydration    Recommended Other Services fit with adjustable , alternative compression wraps for optimal independence w/ donning and doffing. Cnsider CircAid with footpieces.    Consulted and Agree with Plan of Care Patient;Family member/caregiver           Patient will benefit from skilled therapeutic intervention in order to improve the following deficits and impairments:   Body Structure / Function / Physical Skills: ADL,Decreased knowledge of use of DME,Pain,Edema,Scar mobility,Decreased knowledge of precautions,Skin integrity       Visit Diagnosis: Lymphedema, not elsewhere classified    Problem List Patient Active Problem List   Diagnosis Date Noted  . Medication noncompliance due to cognitive impairment 10/17/2019  . Chronic pain of multiple joints 08/01/2017  . Age-related osteoporosis without current pathological fracture 08/01/2017  . Left knee pain 09/19/2016  . Rectal benign neoplasm   . Asthma, chronic 04/04/2015  . history of Tubular adenoma of colon 04/04/2015  . CKD  (chronic kidney disease) stage 3, GFR 30-59 ml/min (HCC) 04/03/2015  . Edema 04/14/2014  . Chronic systolic heart failure ([place]) 11/20/2013  . Lymphedema of lower extremity 10/17/2013  . 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[place])  04/04/2013  . MGUS (monoclonal gammopathy of unknown significance) 05/15/2012  . Vitamin D deficiency 01/29/2010  . DERMATITIS 09/30/2009  . Paranoid delusion ([place]) 08/02/2008  . Obstructive chronic bronchitis ([place]) 08/02/2008  . Essential hypertension 11/11/2007  . Venous (peripheral) insufficiency 11/11/2007  . RENAL CALCULUS 11/11/2007  . LOW BACK PAIN, CHRONIC 11/11/2007    Andrey Spearman, MS, OTR/L, [facility] 12/13/20 4:14 PM  [place] MAIN [facility] SERVICES [address] [place], [place], [address] Phone: [phone]   Fax:  [phone]  Name: [name] MRN: [id number] Date of Birth: [birth year]

## 2020-12-13 NOTE — Therapy (Signed)
[place] MAIN [facility] SERVICES [address] [place], [place], [address] Phone: [phone]   Fax:  [phone]  Occupational Therapy Treatment  Patient Details  Name: [name] MRN: [id number] Date of Birth: 22-Feb-1932 Referring Provider (OT): Sandrea Hughs, NP   Encounter Date: 12/13/2020   OT End of Session - 12/13/20 1522    Visit Number 16    Number of Visits 36    Date for OT Re-Evaluation 03/01/21    Authorization Time Period 12/01/20 - 03-01-21    OT Start Time 0310    OT Stop Time 0408    OT Time Calculation (min) 58 min    Activity Tolerance Patient tolerated treatment well;No increased pain    Behavior During Therapy WFL for tasks assessed/performed           Past Medical History:  Diagnosis Date  . Anemia, unspecified   . Anxiety   . Arthritis   . Asthma   . Delusions ([place])   . Depression   . Edema 2005   "since put the glass in them"   . GERD (gastroesophageal reflux disease)   . Lumbago   . Lymphedema   . Obstructive chronic bronchitis without exacerbation ([place])   . Other abnormal glucose   . 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 Systolic heart failure (HCC)    EF 35 to 40% per echo January 2015  . Unspecified essential hypertension   . Unspecified nonpsychotic mental disorder   . Unspecified venous (peripheral) insufficiency   . Unspecified venous (peripheral) insufficiency   . Unspecified vitamin D deficiency     Past Surgical History:  Procedure Laterality Date  . APPENDECTOMY    . CERVICAL SPINE SURGERY     x 2  . CHOLECYSTECTOMY    . ESOPHAGOGASTRODUODENOSCOPY N/A 04/05/2015   Procedure: ESOPHAGOGASTRODUODENOSCOPY (EGD);  Surgeon: [name], MD;  Location: [facility];  Service: Endoscopy;  Laterality: N/A;  . FLEXIBLE SIGMOIDOSCOPY N/A 04/05/2015   Procedure: FLEXIBLE SIGMOIDOSCOPY;  Surgeon: [name], MD;  Location: [facility];  Service: Endoscopy;  Laterality: N/A;  . HAND SURGERY     . LUMBAR DISC SURGERY    . PARTIAL HYSTERECTOMY    . TONSILLECTOMY      There were no vitals filed for this visit.   Subjective Assessment - 12/13/20 1613    Subjective  [name] presents for OT Rx visit 16/36 to address BLE lymphedma. Pt is accompanied by her nephew, [name]. Pt reports 6-7/10 leg pain this afternoon. She reports ongoing back pain.    Patient is accompanied by: Family member    Pertinent History Chronic obstructive bronchitis, CVI, anxiety , systoloc heart failure, asthma, arthritis, multiple spinal surgeries w/ chronic back pain, DM,    Limitations Difficulty walking, impaired standing tolerance, impaired transfers, decreased joint AROM at toes, ankles and knees, chronic leg swelling , chronic pain. increased infection and falls risks, cognitive impairment, generaized weakness    Repetition Increases Symptoms    Special Tests + Stemmer bilaterally    Patient Stated Goals reduce swelling and legs feel better    Pain Onset Other (comment)   >20 years                       OT Treatments/Exercises (OP) - 12/13/20 1523      ADLs   ADL Education Given Yes      Manual Therapy   Manual Therapy Edema management;Manual Lymphatic  Drainage (MLD);Compression Bandaging;Other (comment)    Manual Lymphatic Drainage (MLD) MLD to LLE as established. Pants worn to clinic severly limit access to inguinal LN      for proximal MLD.    Compression Bandaging Multilayer, gradient compression wraps applied using 1 each of short stretch bandage 8, 10 and 12 cm wide over single layer of fRosidal oam and soft cotton stockinett. from base of toes to tibial tuberosity    Other Manual Therapy skin care throughout MLD to improve hydration and skin flexibility                       OT Long Term Goals - 12/01/20 1711      OT LONG TERM GOAL #1   Title With maximum caregiver assistance Pt will be able to apply knee length, multi-layer, short stretch compression  wraps daily from ankle to tibial tuberosity on one leg at a time using correct gradient techniques to return affected limb/s, as closely as possible, to premorbid size and shape, to limit leg pain and infection risk, and to improve safe functional mobility and ADLs performance.    Baseline dependent    Time 4    Period Days    Status Not Met   To date caregiver assistance with compression wrapping, skin care, self MLD and ther ex has been available daily at beginning of episode, but relative has returned home and at present no one is assisting Pt LE home program between visits.   Target Date 12/28/20      OT LONG TERM GOAL #2   Title With Maximum caregiver assistance Pt will be able to verbalize signs and symptoms of cellulitis infection and identify lymphedema precautions using printed resource (modified independence) for reference to decrease infection risk and limit LE progression over time.    Baseline dependent    Time 6    Period Days    Status Partially Met    Target Date 12/28/20      OT LONG TERM GOAL #3   Title With Maximum caregiver assistance Pt >85 % compliant with all daily, LE self-care protocols for home program, including simple self-manual lymphatic drainage (MLD), skin care, lymphatic pumping ther ex, skin care, and compression wraps and garments  to limit LE progression and further functional decline.    Baseline dependent    Time 12    Period Weeks    Status On-going   difficult to assess. No caregiver at home. Pt lives alone.   Target Date 12/28/20      OT LONG TERM GOAL #4   Baseline dependent    Time 12    Period Weeks    Status Not Met    Target Date 12/28/20      OT LONG TERM GOAL #5   Title Pt to tolerate compression wraps/ garments in keeping w/ prescribed daily wear regime within 1 week of issue date to progress and retain clinical and functional gains and to limit LE progression.    Baseline dependent    Time 12    Period Weeks    Status Achieved   met  for LLE     OT LONG TERM GOAL #6   Title Pt to achieve at least 10% LLE limb volume reduction bilaterally during modified Intensive Phase CDT to reduce infection risk, risk of recurrent leg wounds, to improve functional performance of basic and instrumental ADLs, and to limit LE progression.    Baseline dependent  Time 12    Period Weeks    Status Partially Met   09/28/20 LLE belwo the knee (A-D) decreased by 8.4%. RLE not yet treated.   Target Date 12/28/20                 Plan - 12/13/20 1608    Clinical Impression Statement Limb volume slightly reduced this visit compared with last session.Pt tolerated MLD, skin care and compression wraps without increased pain. Pt not keeping compression in place between visits due to lack of CG assistance. At present LE condition is no better, but is not worse. Pt will benefit from ongoing support to limit infection risk, LE progression and further functional decline. Without skilled OT fo aassistance with LE self management, condition will worsen and functional ambulation and mobility, as well as basic ADLs function will decline.Cont as per POC.    OT Occupational Profile and History Comprehensive Assessment- Review of records and extensive additional review of physical, cognitive, psychosocial history related to current functional performance    Occupational Profile and client history currently impacting functional performance dimensia, inconsistent CG assistance with removing wraps, providing skin care and reapplying compression between clinical visits. S    Occupational performance deficits (Please refer to evaluation for details): ADL's;IADL's;Rest and Sleep;Play;Leisure;Social Participation;Other   body image   Body Structure / Function / Physical Skills ADL;Decreased knowledge of use of DME;Pain;Edema;Scar mobility;Decreased knowledge of precautions;Skin integrity    Rehab Potential Fair    Clinical Decision Making Multiple treatment options,  significant modification of task necessary    Comorbidities Affecting Occupational Performance: Presence of comorbidities impacting occupational performance    Modification or Assistance to Complete Evaluation  Max significant modification of tasks or assist is necessary to complete    OT Frequency 1x / week    OT Duration 12 weeks   Pt will benefit from ongoing OT support for lymphedema self management to limit progression and further functional decline as she is unable to reach feet to perform self care and has very limited caregiver assistance   OT Treatment/Interventions Self-care/ADL training;DME and/or AE instruction;Manual lymph drainage;Patient/family education;Compression bandaging;Therapeutic exercise;Manual Therapy;Other (comment)    Plan skin care to decrease infection risk and improve hydration    Recommended Other Services fit with adjustable , alternative compression wraps for optimal independence w/ donning and doffing. Cnsider CircAid with footpieces.    Consulted and Agree with Plan of Care Patient;Family member/caregiver           Patient will benefit from skilled therapeutic intervention in order to improve the following deficits and impairments:   Body Structure / Function / Physical Skills: ADL,Decreased knowledge of use of DME,Pain,Edema,Scar mobility,Decreased knowledge of precautions,Skin integrity       Visit Diagnosis: Lymphedema, not elsewhere classified    Problem List Patient Active Problem List   Diagnosis Date Noted  . Medication noncompliance due to cognitive impairment 10/17/2019  . Chronic pain of multiple joints 08/01/2017  . Age-related osteoporosis without current pathological fracture 08/01/2017  . Left knee pain 09/19/2016  . Rectal benign neoplasm   . Asthma, chronic 04/04/2015  . history of Tubular adenoma of colon 04/04/2015  . CKD (chronic kidney disease) stage 3, GFR 30-59 ml/min (HCC) 04/03/2015  . Edema 04/14/2014  . Chronic systolic  heart failure ([place]) 11/20/2013  . Lymphedema of lower extremity 10/17/2013  . 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 MGUS (monoclonal gammopathy of unknown significance) 05/15/2012  . Vitamin D deficiency 01/29/2010  . DERMATITIS 09/30/2009  .  Paranoid delusion ([place]) 08/02/2008  . Obstructive chronic bronchitis ([place]) 08/02/2008  . Essential hypertension 11/11/2007  . Venous (peripheral) insufficiency 11/11/2007  . RENAL CALCULUS 11/11/2007  . LOW BACK PAIN, CHRONIC 11/11/2007   Andrey Spearman, MS, OTR/L, [facility] 12/13/20 4:25 PM   [place] MAIN [facility] SERVICES [address] [place], [place], [address] Phone: [phone]   Fax:  [phone]  Name: [name] MRN: [id number] Date of Birth: 09/28/1932

## 2020-12-15 ENCOUNTER — Other Ambulatory Visit: Payer: Self-pay

## 2020-12-15 ENCOUNTER — Ambulatory Visit: Payer: Medicare Other | Admitting: Occupational Therapy

## 2020-12-15 DIAGNOSIS — I890 Lymphedema, not elsewhere classified: Secondary | ICD-10-CM

## 2020-12-15 NOTE — Therapy (Signed)
[place] MAIN [facility] SERVICES [address] [place], [place], [address] Phone: [phone]   Fax:  [phone]  Occupational Therapy Treatment  Patient Details  Name: [name] MRN: [id number] Date of Birth: 1931-11-29 Referring Provider (OT): Sandrea Hughs, NP   Encounter Date: 12/15/2020   OT End of Session - 12/15/20 1504    Visit Number 17    Number of Visits 36    Date for OT Re-Evaluation 03/01/21    Authorization Time Period 12/01/20 - 03-01-21    OT Start Time 0255    OT Stop Time 0404    OT Time Calculation (min) 69 min    Activity Tolerance Patient tolerated treatment well;No increased pain    Behavior During Therapy WFL for tasks assessed/performed           Past Medical History:  Diagnosis Date  . Anemia, unspecified   . Anxiety   . Arthritis   . Asthma   . Delusions ([place])   . Depression   . Edema 2005   "since put the glass in them"   . GERD (gastroesophageal reflux disease)   . Lumbago   . Lymphedema   . Obstructive chronic bronchitis without exacerbation ([place])   . Other abnormal glucose   . 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[place]) 04/04/2013  . Systolic heart failure (HCC)    EF 35 to 40% per echo January 2015  . Unspecified essential hypertension   . Unspecified nonpsychotic mental disorder   . Unspecified venous (peripheral) insufficiency   . Unspecified venous (peripheral) insufficiency   . Unspecified vitamin D deficiency     Past Surgical History:  Procedure Laterality Date  . APPENDECTOMY    . CERVICAL SPINE SURGERY     x 2  . CHOLECYSTECTOMY    . ESOPHAGOGASTRODUODENOSCOPY N/A 04/05/2015   Procedure: ESOPHAGOGASTRODUODENOSCOPY (EGD);  Surgeon: [name], MD;  Location: [facility];  Service: Endoscopy;  Laterality: N/A;  . FLEXIBLE SIGMOIDOSCOPY N/A 04/05/2015   Procedure: FLEXIBLE SIGMOIDOSCOPY;  Surgeon: [name], MD;  Location: [facility];  Service: Endoscopy;  Laterality: N/A;  . HAND SURGERY     . LUMBAR DISC SURGERY    . PARTIAL HYSTERECTOMY    . TONSILLECTOMY      There were no vitals filed for this visit.   Subjective Assessment - 12/15/20 1505    Subjective  [name] presents for OT Rx visit 17/36 to address BLE lymphedma. Pt is accompanied by her nephew, [name]. Pt reports pain  in back is reported 6/10 today. Pt and family member agree on leaving Rx frequency at 1 x weekly rather than increasing to 2 x       per the original POC. Pt's condition is stable at present.    Patient is accompanied by: Family member    Pertinent History Chronic obstructive bronchitis, CVI, anxiety , systoloc heart failure, asthma, arthritis, multiple spinal surgeries w/ chronic back pain, DM,    Limitations Difficulty walking, impaired standing tolerance, impaired transfers, decreased joint AROM at toes, ankles and knees, chronic leg swelling , chronic pain. increased infection and falls risks, cognitive impairment, generaized weakness    Repetition Increases Symptoms    Special Tests + Stemmer bilaterally    Patient Stated Goals reduce swelling and legs feel better    Pain Onset Other (comment)   >20 years  OT Treatments/Exercises (OP) - 12/15/20 0001      ADLs   ADL Education Given Yes      Manual Therapy   Manual Therapy Edema management;Manual Lymphatic Drainage (MLD);Compression Bandaging;Other (comment)    Manual Lymphatic Drainage (MLD) MLD to LLE as established. Pants worn to clinic severly limit access to inguinal LN      for proximal MLD.    Compression Bandaging Multilayer, gradient compression wraps applied using 1 each of short stretch bandage 8, 10 and 12 cm wide over single layer of fRosidal oam and soft cotton stockinett. from base of toes to tibial tuberosity    Other Manual Therapy skin care throughout MLD to improve hydration and skin flexibility                  OT Education - 12/15/20 1607    Education provided Yes     Education Details Continued Pt/ CG edu for lymphedema self care  and home program throughout session. Topics include multilayer, gradient compression wrapping, simple self-MLD, therapeutic lymphatic pumping exercises, skin/nail care, risk reduction factors and LE precautions, compression garments/recommendations and wear and care schedule and compression garment donning / doffing using assistive devices. All questions answered to the Pt's satisfaction, and Pt demonstrates understanding by report.    Person(s) Educated Patient;Caregiver(s)    Methods Explanation;Demonstration    Comprehension Verbalized understanding;Returned demonstration;Need further instruction               OT Long Term Goals - 12/01/20 1711      OT LONG TERM GOAL #1   Title With maximum caregiver assistance Pt will be able to apply knee length, multi-layer, short stretch compression wraps daily from ankle to tibial tuberosity on one leg at a time using correct gradient techniques to return affected limb/s, as closely as possible, to premorbid size and shape, to limit leg pain and infection risk, and to improve safe functional mobility and ADLs performance.    Baseline dependent    Time 4    Period Days    Status Not Met   To date caregiver assistance with compression wrapping, skin care, self MLD and ther ex has been available daily at beginning of episode, but relative has returned home and at present no one is assisting Pt LE home program between visits.   Target Date 12/28/20      OT LONG TERM GOAL #2   Title With Maximum caregiver assistance Pt will be able to verbalize signs and symptoms of cellulitis infection and identify lymphedema precautions using printed resource (modified independence) for reference to decrease infection risk and limit LE progression over time.    Baseline dependent    Time 6    Period Days    Status Partially Met    Target Date 12/28/20      OT LONG TERM GOAL #3   Title With Maximum  caregiver assistance Pt >85 % compliant with all daily, LE self-care protocols for home program, including simple self-manual lymphatic drainage (MLD), skin care, lymphatic pumping ther ex, skin care, and compression wraps and garments  to limit LE progression and further functional decline.    Baseline dependent    Time 12    Period Weeks    Status On-going   difficult to assess. No caregiver at home. Pt lives alone.   Target Date 12/28/20      OT LONG TERM GOAL #4   Baseline dependent    Time 12    Period Weeks  Status Not Met    Target Date 12/28/20      OT LONG TERM GOAL #5   Title Pt to tolerate compression wraps/ garments in keeping w/ prescribed daily wear regime within 1 week of issue date to progress and retain clinical and functional gains and to limit LE progression.    Baseline dependent    Time 12    Period Weeks    Status Achieved   met for LLE     OT LONG TERM GOAL #6   Title Pt to achieve at least 10% LLE limb volume reduction bilaterally during modified Intensive Phase CDT to reduce infection risk, risk of recurrent leg wounds, to improve functional performance of basic and instrumental ADLs, and to limit LE progression.    Baseline dependent    Time 12    Period Weeks    Status Partially Met   09/28/20 LLE belwo the knee (A-D) decreased by 8.4%. RLE not yet treated.   Target Date 12/28/20                 Plan - 12/15/20 1608    Clinical Impression Statement Provided LLE MLD, skin care and re applied knee length gradient compression wraps without pain. Led continues to peel and flake dry skin profusely, but no lymphorrhea is observed. Pt continues to benefit from follow along OT for CDT due to severity of her condition and the chronic , progressive nature of  lymphedema. Without skiled OT follow along Pt's condition will worsen and she will experience further functional decline.Cont as per POC.    OT Occupational Profile and History Comprehensive Assessment-  Review of records and extensive additional review of physical, cognitive, psychosocial history related to current functional performance    Occupational Profile and client history currently impacting functional performance dimensia, inconsistent CG assistance with removing wraps, providing skin care and reapplying compression between clinical visits. S    Occupational performance deficits (Please refer to evaluation for details): ADL's;IADL's;Rest and Sleep;Play;Leisure;Social Participation;Other   body image   Body Structure / Function / Physical Skills ADL;Decreased knowledge of use of DME;Pain;Edema;Scar mobility;Decreased knowledge of precautions;Skin integrity    Rehab Potential Fair    Clinical Decision Making Multiple treatment options, significant modification of task necessary    Comorbidities Affecting Occupational Performance: Presence of comorbidities impacting occupational performance    Modification or Assistance to Complete Evaluation  Max significant modification of tasks or assist is necessary to complete    OT Frequency 1x / week    OT Duration 6 weeks   and PRN   OT Treatment/Interventions Self-care/ADL training;DME and/or AE instruction;Manual lymph drainage;Patient/family education;Compression bandaging;Therapeutic exercise;Manual Therapy;Other (comment)    Plan skin care to decrease infection risk and improve hydration    Recommended Other Services fit with adjustable , alternative compression wraps for optimal independence w/ donning and doffing. Cnsider CircAid with footpieces.    Consulted and Agree with Plan of Care Patient;Family member/caregiver           Patient will benefit from skilled therapeutic intervention in order to improve the following deficits and impairments:   Body Structure / Function / Physical Skills: ADL,Decreased knowledge of use of DME,Pain,Edema,Scar mobility,Decreased knowledge of precautions,Skin integrity       Visit Diagnosis: Lymphedema,  not elsewhere classified    Problem List Patient Active Problem List   Diagnosis Date Noted  . Medication noncompliance due to cognitive impairment 10/17/2019  . Chronic pain of multiple joints 08/01/2017  . Age-related osteoporosis without  current pathological fracture 08/01/2017  . Left knee pain 09/19/2016  . Rectal benign neoplasm   . Asthma, chronic 04/04/2015  . history of Tubular adenoma of colon 04/04/2015  . CKD (chronic kidney disease) stage 3, GFR 30-59 ml/min (HCC) 04/03/2015  . Edema 04/14/2014  . Chronic systolic heart failure ([place]) 11/20/2013  . Lymphedema of lower extremity 10/17/2013  . 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 MGUS (monoclonal gammopathy of unknown significance) 05/15/2012  . Vitamin D deficiency 01/29/2010  . DERMATITIS 09/30/2009  . Paranoid delusion ([place]) 08/02/2008  . Obstructive chronic bronchitis ([place]) 08/02/2008  . Essential hypertension 11/11/2007  . Venous (peripheral) insufficiency 11/11/2007  . RENAL CALCULUS 11/11/2007  . LOW BACK PAIN, CHRONIC 11/11/2007    Andrey Spearman, MS, OTR/L, [facility] 12/15/20 4:13 PM  [place] MAIN [facility] SERVICES [address] [place], [place], [address] Phone: [phone]   Fax:  [phone]  Name: [name] MRN: [id number] Date of Birth: Dec 16, 1931

## 2020-12-20 ENCOUNTER — Other Ambulatory Visit: Payer: Self-pay

## 2020-12-20 ENCOUNTER — Ambulatory Visit: Payer: Medicare Other | Admitting: Occupational Therapy

## 2020-12-20 DIAGNOSIS — I890 Lymphedema, not elsewhere classified: Secondary | ICD-10-CM | POA: Diagnosis not present

## 2020-12-20 NOTE — Therapy (Signed)
[place] MAIN [facility] SERVICES [address] [place], [place], [address] Phone: [phone]   Fax:  [phone]  Occupational Therapy Treatment  Patient Details  Name: [name] MRN: [id number] Date of Birth: Aug 22, 1932 Referring Provider (OT): Sandrea Hughs, NP   Encounter Date: 12/20/2020   OT End of Session - 12/20/20 1419    Visit Number 18    Number of Visits 36    Date for OT Re-Evaluation 03/01/21    Authorization Time Period 12/01/20 - 03-01-21    OT Start Time 0210    OT Stop Time 0305    OT Time Calculation (min) 55 min    Activity Tolerance Patient tolerated treatment well;No increased pain    Behavior During Therapy WFL for tasks assessed/performed           Past Medical History:  Diagnosis Date  . Anemia, unspecified   . Anxiety   . Arthritis   . Asthma   . Delusions ([place])   . Depression   . Edema 2005   "since put the glass in them"   . GERD (gastroesophageal reflux disease)   . Lumbago   . Lymphedema   . Obstructive chronic bronchitis without exacerbation ([place])   . Other abnormal glucose   . 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 Systolic heart failure (HCC)    EF 35 to 40% per echo January 2015  . Unspecified essential hypertension   . Unspecified nonpsychotic mental disorder   . Unspecified venous (peripheral) insufficiency   . Unspecified venous (peripheral) insufficiency   . Unspecified vitamin D deficiency     Past Surgical History:  Procedure Laterality Date  . APPENDECTOMY    . CERVICAL SPINE SURGERY     x 2  . CHOLECYSTECTOMY    . ESOPHAGOGASTRODUODENOSCOPY N/A 04/05/2015   Procedure: ESOPHAGOGASTRODUODENOSCOPY (EGD);  Surgeon: [name], MD;  Location: [facility];  Service: Endoscopy;  Laterality: N/A;  . FLEXIBLE SIGMOIDOSCOPY N/A 04/05/2015   Procedure: FLEXIBLE SIGMOIDOSCOPY;  Surgeon: [name], MD;  Location: [facility];  Service: Endoscopy;  Laterality: N/A;  . HAND SURGERY     . LUMBAR DISC SURGERY    . PARTIAL HYSTERECTOMY    . TONSILLECTOMY      There were no vitals filed for this visit.   Subjective Assessment - 12/20/20 1506    Subjective  [name] presents for OT Rx visit 18/36 to address BLE lymphedma. Pt is accompanied by her nephew, [name]. Pt reports pain  in back is reported 6/10 today.    Patient is accompanied by: Family member    Pertinent History Chronic obstructive bronchitis, CVI, anxiety , systoloc heart failure, asthma, arthritis, multiple spinal surgeries w/ chronic back pain, DM,    Limitations Difficulty walking, impaired standing tolerance, impaired transfers, decreased joint AROM at toes, ankles and knees, chronic leg swelling , chronic pain. increased infection and falls risks, cognitive impairment, generaized weakness    Repetition Increases Symptoms    Special Tests + Stemmer bilaterally    Patient Stated Goals reduce swelling and legs feel better    Pain Onset Other (comment)   >20 years                       OT Treatments/Exercises (OP) - 12/20/20 1507      ADLs   ADL Education Given Yes      Manual Therapy   Manual Therapy Edema management;Manual Lymphatic Drainage (MLD);Compression  Bandaging;Other (comment)    Manual Lymphatic Drainage (MLD) MLD to LLE as established. Pants worn to clinic severly limit access to inguinal LN      for proximal MLD.    Compression Bandaging Multilayer, gradient compression wraps applied using 1 each of short stretch bandage 8, 10 and 12 cm wide over single layer of fRosidal oam and soft cotton stockinett. from base of toes to tibial tuberosity    Other Manual Therapy skin care throughout MLD to improve hydration and skin flexibility                  OT Education - 12/20/20 1507    Education provided Yes    Education Details Continued Pt/ CG edu for lymphedema self care  and home program throughout session. Topics include multilayer, gradient compression wrapping,  simple self-MLD, therapeutic lymphatic pumping exercises, skin/nail care, risk reduction factors and LE precautions, compression garments/recommendations and wear and care schedule and compression garment donning / doffing using assistive devices. All questions answered to the Pt's satisfaction, and Pt demonstrates understanding by report.    Person(s) Educated Patient;Caregiver(s)    Methods Explanation;Demonstration    Comprehension Verbalized understanding;Returned demonstration;Need further instruction               OT Long Term Goals - 12/01/20 1711      OT LONG TERM GOAL #1   Title With maximum caregiver assistance Pt will be able to apply knee length, multi-layer, short stretch compression wraps daily from ankle to tibial tuberosity on one leg at a time using correct gradient techniques to return affected limb/s, as closely as possible, to premorbid size and shape, to limit leg pain and infection risk, and to improve safe functional mobility and ADLs performance.    Baseline dependent    Time 4    Period Days    Status Not Met   To date caregiver assistance with compression wrapping, skin care, self MLD and ther ex has been available daily at beginning of episode, but relative has returned home and at present no one is assisting Pt LE home program between visits.   Target Date 12/28/20      OT LONG TERM GOAL #2   Title With Maximum caregiver assistance Pt will be able to verbalize signs and symptoms of cellulitis infection and identify lymphedema precautions using printed resource (modified independence) for reference to decrease infection risk and limit LE progression over time.    Baseline dependent    Time 6    Period Days    Status Partially Met    Target Date 12/28/20      OT LONG TERM GOAL #3   Title With Maximum caregiver assistance Pt >85 % compliant with all daily, LE self-care protocols for home program, including simple self-manual lymphatic drainage (MLD), skin care,  lymphatic pumping ther ex, skin care, and compression wraps and garments  to limit LE progression and further functional decline.    Baseline dependent    Time 12    Period Weeks    Status On-going   difficult to assess. No caregiver at home. Pt lives alone.   Target Date 12/28/20      OT LONG TERM GOAL #4   Baseline dependent    Time 12    Period Weeks    Status Not Met    Target Date 12/28/20      OT LONG TERM GOAL #5   Title Pt to tolerate compression wraps/ garments in keeping w/  prescribed daily wear regime within 1 week of issue date to progress and retain clinical and functional gains and to limit LE progression.    Baseline dependent    Time 12    Period Weeks    Status Achieved   met for LLE     OT LONG TERM GOAL #6   Title Pt to achieve at least 10% LLE limb volume reduction bilaterally during modified Intensive Phase CDT to reduce infection risk, risk of recurrent leg wounds, to improve functional performance of basic and instrumental ADLs, and to limit LE progression.    Baseline dependent    Time 12    Period Weeks    Status Partially Met   09/28/20 LLE belwo the knee (A-D) decreased by 8.4%. RLE not yet treated.   Target Date 12/28/20                 Plan - 12/20/20 1508    Clinical Impression Statement Pt continues to tolerate all aspects of CDT to LLE. Pt has no signs of infection and no open wounds at present, and she has remained relatively stable over time with LE control since resuming treatment. Skin dryness and cracking/ flaking with dense fibrosis and deep skin folds continues to contribute to significant infection risk. Pt tolerated MLD, skin acre and 4 layer compression wrap today without increased pain. No change in limb volume or skin condition noted at end of session. Pt benefits from continuing LE care on a weekly basis to limit LE progression and further functional decline. Without skilled OT for LE care LE will progress and wounds and infection  risk will escalate.    OT Occupational Profile and History Comprehensive Assessment- Review of records and extensive additional review of physical, cognitive, psychosocial history related to current functional performance    Occupational Profile and client history currently impacting functional performance dimensia, inconsistent CG assistance with removing wraps, providing skin care and reapplying compression between clinical visits. S    Occupational performance deficits (Please refer to evaluation for details): ADL's;IADL's;Rest and Sleep;Play;Leisure;Social Participation;Other   body image   Body Structure / Function / Physical Skills ADL;Decreased knowledge of use of DME;Pain;Edema;Scar mobility;Decreased knowledge of precautions;Skin integrity    Rehab Potential Fair    Clinical Decision Making Multiple treatment options, significant modification of task necessary    Comorbidities Affecting Occupational Performance: Presence of comorbidities impacting occupational performance    Modification or Assistance to Complete Evaluation  Max significant modification of tasks or assist is necessary to complete    OT Frequency 1x / week    OT Duration 6 weeks   and PRN   OT Treatment/Interventions Self-care/ADL training;DME and/or AE instruction;Manual lymph drainage;Patient/family education;Compression bandaging;Therapeutic exercise;Manual Therapy;Other (comment)    Plan skin care to decrease infection risk and improve hydration    Recommended Other Services fit with adjustable , alternative compression wraps for optimal independence w/ donning and doffing. Cnsider CircAid with footpieces.    Consulted and Agree with Plan of Care Patient;Family member/caregiver           Patient will benefit from skilled therapeutic intervention in order to improve the following deficits and impairments:   Body Structure / Function / Physical Skills: ADL,Decreased knowledge of use of DME,Pain,Edema,Scar  mobility,Decreased knowledge of precautions,Skin integrity       Visit Diagnosis: Lymphedema, not elsewhere classified    Problem List Patient Active Problem List   Diagnosis Date Noted  . Medication noncompliance due to cognitive impairment 10/17/2019  .  Chronic pain of multiple joints 08/01/2017  . Age-related osteoporosis without current pathological fracture 08/01/2017  . Left knee pain 09/19/2016  . Rectal benign neoplasm   . Asthma, chronic 04/04/2015  . history of Tubular adenoma of colon 04/04/2015  . CKD (chronic kidney disease) stage 3, GFR 30-59 ml/min (HCC) 04/03/2015  . Edema 04/14/2014  . Chronic systolic heart failure ([place]) 11/20/2013  . Lymphedema of lower extremity 10/17/2013  . 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 MGUS (monoclonal gammopathy of unknown significance) 05/15/2012  . Vitamin D deficiency 01/29/2010  . DERMATITIS 09/30/2009  . Paranoid delusion ([place]) 08/02/2008  . Obstructive chronic bronchitis ([place]) 08/02/2008  . Essential hypertension 11/11/2007  . Venous (peripheral) insufficiency 11/11/2007  . RENAL CALCULUS 11/11/2007  . LOW BACK PAIN, CHRONIC 11/11/2007    Andrey Spearman, MS, OTR/L, [facility] 12/20/20 4:41 PM  [place] MAIN [facility] SERVICES [address] [place], [place], [address] Phone: [phone]   Fax:  [phone]  Name: RAZIA SCREWS MRN: [id number] Date of Birth: [birth year]

## 2020-12-22 ENCOUNTER — Encounter: Payer: Medicare Other | Admitting: Occupational Therapy

## 2020-12-27 ENCOUNTER — Other Ambulatory Visit: Payer: Self-pay

## 2020-12-27 ENCOUNTER — Ambulatory Visit: Payer: Medicare Other | Admitting: Occupational Therapy

## 2020-12-27 DIAGNOSIS — I890 Lymphedema, not elsewhere classified: Secondary | ICD-10-CM | POA: Diagnosis not present

## 2020-12-27 NOTE — Therapy (Signed)
[place] MAIN [facility] SERVICES [address] [place], [place], [address] Phone: [phone]   Fax:  [phone]  Occupational Therapy Treatment  Patient Details  Name: [name] MRN: [id number] Date of Birth: [birth year] Referring Provider (OT): Sandrea Hughs, NP   Encounter Date: 12/27/2020   OT End of Session - 12/27/20 1510    Visit Number 19    Number of Visits 36    Date for OT Re-Evaluation 03/01/21    Authorization Time Period 12/01/20 - 03-01-21    OT Start Time 0305    OT Stop Time 0408    OT Time Calculation (min) 63 min    Activity Tolerance Patient tolerated treatment well;No increased pain    Behavior During Therapy WFL for tasks assessed/performed           Past Medical History:  Diagnosis Date  . Anemia, unspecified   . Anxiety   . Arthritis   . Asthma   . Delusions ([place])   . Depression   . Edema 2005   "since put the glass in them"   . GERD (gastroesophageal reflux disease)   . Lumbago   . Lymphedema   . Obstructive chronic bronchitis without exacerbation ([place])   . Other abnormal glucose   . 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[place]) 04/04/2013  . Systolic heart failure (HCC)    EF 35 to 40% per echo January 2015  . Unspecified essential hypertension   . Unspecified nonpsychotic mental disorder   . Unspecified venous (peripheral) insufficiency   . Unspecified venous (peripheral) insufficiency   . Unspecified vitamin D deficiency     Past Surgical History:  Procedure Laterality Date  . APPENDECTOMY    . CERVICAL SPINE SURGERY     x 2  . CHOLECYSTECTOMY    . ESOPHAGOGASTRODUODENOSCOPY N/A 04/05/2015   Procedure: ESOPHAGOGASTRODUODENOSCOPY (EGD);  Surgeon: [name], MD;  Location: [facility];  Service: Endoscopy;  Laterality: N/A;  . FLEXIBLE SIGMOIDOSCOPY N/A 04/05/2015   Procedure: FLEXIBLE SIGMOIDOSCOPY;  Surgeon: [name], MD;  Location: [facility];  Service: Endoscopy;  Laterality: N/A;  . HAND SURGERY     . LUMBAR DISC SURGERY    . PARTIAL HYSTERECTOMY    . TONSILLECTOMY      There were no vitals filed for this visit.   Subjective Assessment - 12/27/20 1511    Subjective  [name] presents for OT Rx visit 19//36 to address BLE lymphedma. Pt is accompanied by her nephew, [name]. Pt reports pain  in back is reported 7/10 today.    Patient is accompanied by: Family member    Pertinent History Chronic obstructive bronchitis, CVI, anxiety , systoloc heart failure, asthma, arthritis, multiple spinal surgeries w/ chronic back pain, DM,    Limitations Difficulty walking, impaired standing tolerance, impaired transfers, decreased joint AROM at toes, ankles and knees, chronic leg swelling , chronic pain. increased infection and falls risks, cognitive impairment, generaized weakness    Repetition Increases Symptoms    Special Tests + Stemmer bilaterally    Patient Stated Goals reduce swelling and legs feel better    Pain Onset Other (comment)   >20 years              LYMPHEDEMA/ONCOLOGY QUESTIONNAIRE - 12/27/20 1609      Lymphedema Assessments   Lymphedema Assessments Lower extremities      Right Lower Extremity Lymphedema   Other L leg from ankle to tibia tuberosity (A-D) measures 4137.9 ml.  Other LLE limb volume is INCREASED by 3.97% % since last measured on 09/28/20    Other Overal L leg volume reduction since commencing CDT 1 x weekly to assist w/ self-management measures 5.02% from ankle to politeal.                   OT Treatments/Exercises (OP) - 12/27/20 1608      ADLs   ADL Education Given Yes      Manual Therapy   Manual Therapy Edema management;Manual Lymphatic Drainage (MLD);Compression Bandaging    Edema Management LLE comaprative limb volumetrics    Manual Lymphatic Drainage (MLD) MLD to LLE as established. Pants worn to clinic severly limit access to inguinal LN      for proximal MLD.    Compression Bandaging Multilayer, gradient compression wraps  applied using 1 each of short stretch bandage 8, 10 and 12 cm wide over single layer of fRosidal oam and soft cotton stockinett. from base of toes to tibial tuberosity                  OT Education - 12/27/20 1613    Education provided Yes    Education Details Continued Pt/ CG edu for lymphedema self care  and home program throughout session. Topics include multilayer, gradient compression wrapping, simple self-MLD, therapeutic lymphatic pumping exercises, skin/nail care, risk reduction factors and LE precautions, compression garments/recommendations and wear and care schedule and compression garment donning / doffing using assistive devices. All questions answered to the Pt's satisfaction, and Pt demonstrates understanding by report.    Person(s) Educated Patient;Caregiver(s)    Methods Explanation;Demonstration    Comprehension Verbalized understanding;Returned demonstration;Need further instruction               OT Long Term Goals - 12/01/20 1711      OT LONG TERM GOAL #1   Title With maximum caregiver assistance Pt will be able to apply knee length, multi-layer, short stretch compression wraps daily from ankle to tibial tuberosity on one leg at a time using correct gradient techniques to return affected limb/s, as closely as possible, to premorbid size and shape, to limit leg pain and infection risk, and to improve safe functional mobility and ADLs performance.    Baseline dependent    Time 4    Period Days    Status Not Met   To date caregiver assistance with compression wrapping, skin care, self MLD and ther ex has been available daily at beginning of episode, but relative has returned home and at present no one is assisting Pt LE home program between visits.   Target Date 12/28/20      OT LONG TERM GOAL #2   Title With Maximum caregiver assistance Pt will be able to verbalize signs and symptoms of cellulitis infection and identify lymphedema precautions using printed  resource (modified independence) for reference to decrease infection risk and limit LE progression over time.    Baseline dependent    Time 6    Period Days    Status Partially Met    Target Date 12/28/20      OT LONG TERM GOAL #3   Title With Maximum caregiver assistance Pt >85 % compliant with all daily, LE self-care protocols for home program, including simple self-manual lymphatic drainage (MLD), skin care, lymphatic pumping ther ex, skin care, and compression wraps and garments  to limit LE progression and further functional decline.    Baseline dependent    Time 12  Period Weeks    Status On-going   difficult to assess. No caregiver at home. Pt lives alone.   Target Date 12/28/20      OT LONG TERM GOAL #4   Baseline dependent    Time 12    Period Weeks    Status Not Met    Target Date 12/28/20      OT LONG TERM GOAL #5   Title Pt to tolerate compression wraps/ garments in keeping w/ prescribed daily wear regime within 1 week of issue date to progress and retain clinical and functional gains and to limit LE progression.    Baseline dependent    Time 12    Period Weeks    Status Achieved   met for LLE     OT LONG TERM GOAL #6   Title Pt to achieve at least 10% LLE limb volume reduction bilaterally during modified Intensive Phase CDT to reduce infection risk, risk of recurrent leg wounds, to improve functional performance of basic and instrumental ADLs, and to limit LE progression.    Baseline dependent    Time 12    Period Weeks    Status Partially Met   09/28/20 LLE belwo the knee (A-D) decreased by 8.4%. RLE not yet treated.   Target Date 12/28/20                 Plan - 12/27/20 1614    Clinical Impression Statement Completed LLE comparative limb volumetrics in preparation for progress note next session. Volumetrics reveal LLE limb volume is INCREASED by 3.97% % since last measured on 09/28/20, but overall L leg volume is DECREASED by 5.02%  since commencing CDT  1 x weekly in September , 2021. Pt dontinues to make progress towards all goals. She tolerated MLD, skin care and compression wrapping for 2nd 30 minutes of session without dffic7ulties. Cont OT 1 x weekly as per POC to assist with ongoing self management.    OT Occupational Profile and History Comprehensive Assessment- Review of records and extensive additional review of physical, cognitive, psychosocial history related to current functional performance    Occupational Profile and client history currently impacting functional performance dimensia, inconsistent CG assistance with removing wraps, providing skin care and reapplying compression between clinical visits. S    Occupational performance deficits (Please refer to evaluation for details): ADL's;IADL's;Rest and Sleep;Play;Leisure;Social Participation;Other   body image   Body Structure / Function / Physical Skills ADL;Decreased knowledge of use of DME;Pain;Edema;Scar mobility;Decreased knowledge of precautions;Skin integrity    Rehab Potential Fair    Clinical Decision Making Multiple treatment options, significant modification of task necessary    Comorbidities Affecting Occupational Performance: Presence of comorbidities impacting occupational performance    Modification or Assistance to Complete Evaluation  Max significant modification of tasks or assist is necessary to complete    OT Frequency 1x / week    OT Duration 6 weeks   and PRN   OT Treatment/Interventions Self-care/ADL training;DME and/or AE instruction;Manual lymph drainage;Patient/family education;Compression bandaging;Therapeutic exercise;Manual Therapy;Other (comment)    Plan skin care to decrease infection risk and improve hydration    Recommended Other Services fit with adjustable , alternative compression wraps for optimal independence w/ donning and doffing. Cnsider CircAid with footpieces.    Consulted and Agree with Plan of Care Patient;Family member/caregiver            Patient will benefit from skilled therapeutic intervention in order to improve the following deficits and impairments:   Body Structure /  Function / Physical Skills: ADL,Decreased knowledge of use of DME,Pain,Edema,Scar mobility,Decreased knowledge of precautions,Skin integrity       Visit Diagnosis: Lymphedema, not elsewhere classified    Problem List Patient Active Problem List   Diagnosis Date Noted  . Medication noncompliance due to cognitive impairment 10/17/2019  . Chronic pain of multiple joints 08/01/2017  . Age-related osteoporosis without current pathological fracture 08/01/2017  . Left knee pain 09/19/2016  . Rectal benign neoplasm   . Asthma, chronic 04/04/2015  . history of Tubular adenoma of colon 04/04/2015  . CKD (chronic kidney disease) stage 3, GFR 30-59 ml/min (HCC) 04/03/2015  . Edema 04/14/2014  . Chronic systolic heart failure ([place]) 11/20/2013  . Lymphedema of lower extremity 10/17/2013  . 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 MGUS (monoclonal gammopathy of unknown significance) 05/15/2012  . Vitamin D deficiency 01/29/2010  . DERMATITIS 09/30/2009  . Paranoid delusion ([place]) 08/02/2008  . Obstructive chronic bronchitis ([place]) 08/02/2008  . Essential hypertension 11/11/2007  . Venous (peripheral) insufficiency 11/11/2007  . RENAL CALCULUS 11/11/2007  . LOW BACK PAIN, CHRONIC 11/11/2007    Andrey Spearman, MS, OTR/L, [facility] 12/27/20 4:21 PM  [place] MAIN [facility] SERVICES [address] [place], [place], [address] Phone: [phone]   Fax:  [phone]  Name: [name] MRN: [id number] Date of Birth: Jan 28, 1932

## 2020-12-29 ENCOUNTER — Encounter: Payer: Medicare Other | Admitting: Occupational Therapy

## 2020-12-29 ENCOUNTER — Ambulatory Visit: Payer: Self-pay | Admitting: Nurse Practitioner

## 2021-01-03 ENCOUNTER — Ambulatory Visit: Payer: Medicare Other | Attending: Family | Admitting: Occupational Therapy

## 2021-01-03 ENCOUNTER — Other Ambulatory Visit: Payer: Self-pay

## 2021-01-03 DIAGNOSIS — I890 Lymphedema, not elsewhere classified: Secondary | ICD-10-CM | POA: Insufficient documentation

## 2021-01-04 NOTE — Therapy (Signed)
[place] MAIN [facility] SERVICES [address] [place], [place], [address] Phone: [phone]   Fax:  [phone]  Occupational Therapy Treatment Note and Progress Report 09/28/20 - 01/03/21  Patient Details  Name: [name] MRN: [id number] Date of Birth: [birth year] Referring Provider (OT): Sandrea Hughs, NP   Encounter Date: 01/03/2021   OT End of Session - 01/04/21 0939    Visit Number 20    Number of Visits 36    Date for OT Re-Evaluation 03/01/21    Authorization Time Period 09/28/20 - 01/03/21    OT Start Time 0300    OT Stop Time 0412    OT Time Calculation (min) 72 min    Activity Tolerance Patient tolerated treatment well;No increased pain;Patient limited by pain    Behavior During Therapy [facility] for tasks assessed/performed           Past Medical History:  Diagnosis Date  . Anemia, unspecified   . Anxiety   . Arthritis   . Asthma   . Delusions ([place])   . Depression   . Edema 2005   "since put the glass in them"   . GERD (gastroesophageal reflux disease)   . Lumbago   . Lymphedema   . Obstructive chronic bronchitis without exacerbation ([place])   . Other abnormal glucose   . 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 Systolic heart failure (HCC)    EF 35 to 40% per echo January 2015  . Unspecified essential hypertension   . Unspecified nonpsychotic mental disorder   . Unspecified venous (peripheral) insufficiency   . Unspecified venous (peripheral) insufficiency   . Unspecified vitamin D deficiency     Past Surgical History:  Procedure Laterality Date  . APPENDECTOMY    . CERVICAL SPINE SURGERY     x 2  . CHOLECYSTECTOMY    . ESOPHAGOGASTRODUODENOSCOPY N/A 04/05/2015   Procedure: ESOPHAGOGASTRODUODENOSCOPY (EGD);  Surgeon: [name], MD;  Location: [facility];  Service: Endoscopy;  Laterality: N/A;  . FLEXIBLE SIGMOIDOSCOPY N/A 04/05/2015   Procedure: FLEXIBLE SIGMOIDOSCOPY;  Surgeon: [name], MD;  Location:  [facility];  Service: Endoscopy;  Laterality: N/A;  . HAND SURGERY    . LUMBAR DISC SURGERY    . PARTIAL HYSTERECTOMY    . TONSILLECTOMY      There were no vitals filed for this visit.   Subjective Assessment - 01/04/21 1231    Subjective  [name] presents for OT Rx visit 20//36 to address BLE lymphedma. Pt is accompanied by her nephew, [name]. Pt reports pain  in back is reported 7/10 today. Pt reports a family member assisted her with skin care to both legs and feet over the weekend.    Patient is accompanied by: Family member    Pertinent History Chronic obstructive bronchitis, CVI, anxiety , systoloc heart failure, asthma, arthritis, multiple spinal surgeries w/ chronic back pain, DM,    Limitations Difficulty walking, impaired standing tolerance, impaired transfers, decreased joint AROM at toes, ankles and knees, chronic leg swelling , chronic pain. increased infection and falls risks, cognitive impairment, generaized weakness    Repetition Increases Symptoms    Special Tests + Stemmer bilaterally    Patient Stated Goals reduce swelling and legs feel better    Pain Onset Other (comment)   >20 years                       OT Treatments/Exercises (OP) - 01/04/21 1233  ADLs   ADL Education Given Yes      Manual Therapy   Manual Therapy Edema management;Manual Lymphatic Drainage (MLD);Compression Bandaging    Edema Management progress review    Manual Lymphatic Drainage (MLD) MLD to LLE as established. Pants worn to clinic severly limit access to inguinal LN      for proximal MLD.    Compression Bandaging Multilayer, gradient compression wraps applied using 1 each of short stretch bandage 8, 10 and 12 cm wide over single layer of fRosidal oam and soft cotton stockinett. from base of toes to tibial tuberosity                  OT Education - 01/03/21 1233    Education provided Yes    Education Details Continued Pt/ CG edu for lymphedema self care   and home program throughout session. Topics include multilayer, gradient compression wrapping, simple self-MLD, therapeutic lymphatic pumping exercises, skin/nail care, risk reduction factors and LE precautions, compression garments/recommendations and wear and care schedule and compression garment donning / doffing using assistive devices. All questions answered to the Pt's satisfaction, and Pt demonstrates understanding by report.    Person(s) Educated Patient;Caregiver(s)    Methods Explanation;Demonstration    Comprehension Verbalized understanding;Returned demonstration;Need further instruction               OT Long Term Goals - 01/04/21 0955      OT LONG TERM GOAL #1   Title With maximum caregiver assistance Pt will be able to apply knee length, multi-layer, short stretch compression wraps daily from ankle to tibial tuberosity on one leg at a time using correct gradient techniques to return affected limb/s, as closely as possible, to premorbid size and shape, to limit leg pain and infection risk, and to improve safe functional mobility and ADLs performance. 01/03/21: Manufacturer's rep coming next week to assist with fitting adjustable, Velcro style , knee length compression leg wraps. Hopefiullly Pt will be able to manage these more independently, and may be more do-able for caregivers. Drawback is they do not manage foot swelling well.    Baseline dependent    Time 4    Period Days    Status Not Met   DC goal.   Target Date --   DC goal     OT LONG TERM GOAL #2   Title With Maximum caregiver assistance Pt will be able to verbalize signs and symptoms of cellulitis infection and identify lymphedema precautions using printed resource (modified independence) for reference to decrease infection risk and limit LE progression over time.    Baseline dependent    Time 6    Period Days    Status Achieved      OT LONG TERM GOAL #3   Title With Maximum caregiver assistance Pt >85 % compliant  with all daily, LE self-care protocols for home program, including simple self-manual lymphatic drainage (MLD), skin care, lymphatic pumping ther ex, skin care, and compression wraps and garments  to limit LE progression and further functional decline.    Baseline dependent    Time 12    Period Weeks    Status Not Met   DC goal. Pt lives alone and has no consistent CG assistance with home program components other than skin care.     OT LONG TERM GOAL #4   Baseline dependent    Time 12    Period Weeks    Status Not Met      OT LONG TERM  GOAL #5   Title Pt to tolerate compression wraps/ garments in keeping w/ prescribed daily wear regime within 1 week of issue date to progress and retain clinical and functional gains and to limit LE progression.    Baseline dependent    Time 12    Period Weeks    Status Achieved   met for LLE     OT LONG TERM GOAL #6   Title Pt to achieve at least 10% LLE limb volume reduction bilaterally during modified Intensive Phase CDT to reduce infection risk, risk of recurrent leg wounds, to improve functional performance of basic and instrumental ADLs, and to limit LE progression.    Baseline dependent    Time 12    Period Weeks    Status Partially Met   09/28/20 LLE belwo the knee (A-D) decreased by 8.4%. RLE not yet treated.                Plan - 01/04/21 0940    Clinical Impression Statement PROGRESS : Volumetrics completed at last visit a week ago reveal L leg (ankle-tibial tuberosity/ A-D)  volume is INCREASED by 3.97% % since last measured on 09/28/20, but overall L leg volume is DECREASED by 5.02%  since commencing CDT 1 x weekly in September , 2021. This value does not meet 10% reduction goal, but demonstrates excellent progress to date. Pt has remained infection free , has had no open wounds/ skin break down, demonstrates imuch improved skin condition at distal L leg and foot, improved tolerance for compression appplied in clinic and much more  consistent attendance . Although Pt lives alone and is  unable to reapply gradient compression wraps herself,  family caregivers continue to assist her when they are able with skin care, keeping wraps clean and ensuring they are brought to appoinrments, and consistent transportation to OT visits. Pt demonstrates unprecidented progress towards  steady lymphedema management during this most recent course of treatment. Refer to LONG TERM GOALS for additional details re progress towards goals. TODAY [name] tolerated MLD, skin care and compression wrapping without pain. Pt will benefit from ongoing skilled support for lymphedema management to limit progression, skin breakdown and infection risk. Without ongoing skilled OT for LE care, condition will worsen and Pt will experience further functional decline.  Cont OT 1 x weekly as per POC to assist with ongoing self management.    OT Occupational Profile and History Comprehensive Assessment- Review of records and extensive additional review of physical, cognitive, psychosocial history related to current functional performance    Occupational Profile and client history currently impacting functional performance dimensia, inconsistent CG assistance with removing wraps, providing skin care and reapplying compression between clinical visits. S    Occupational performance deficits (Please refer to evaluation for details): ADL's;IADL's;Rest and Sleep;Play;Leisure;Social Participation;Other   body image   Body Structure / Function / Physical Skills ADL;Decreased knowledge of use of DME;Pain;Edema;Scar mobility;Decreased knowledge of precautions;Skin integrity    Rehab Potential Fair    Clinical Decision Making Multiple treatment options, significant modification of task necessary    Comorbidities Affecting Occupational Performance: Presence of comorbidities impacting occupational performance    Modification or Assistance to Complete Evaluation  Max significant  modification of tasks or assist is necessary to complete    OT Frequency 1x / week    OT Duration 6 weeks   and PRN   OT Treatment/Interventions Self-care/ADL training;DME and/or AE instruction;Manual lymph drainage;Patient/family education;Compression bandaging;Therapeutic exercise;Manual Therapy;Other (comment)    Plan skin  care to decrease infection risk and improve hydration    Recommended Other Services fit with adjustable , alternative compression wraps for optimal independence w/ donning and doffing. Cnsider CircAid with footpieces.    Consulted and Agree with Plan of Care Patient;Family member/caregiver           Patient will benefit from skilled therapeutic intervention in order to improve the following deficits and impairments:   Body Structure / Function / Physical Skills: ADL,Decreased knowledge of use of DME,Pain,Edema,Scar mobility,Decreased knowledge of precautions,Skin integrity       Visit Diagnosis: Lymphedema, not elsewhere classified    Problem List Patient Active Problem List   Diagnosis Date Noted  . Medication noncompliance due to cognitive impairment 10/17/2019  . Chronic pain of multiple joints 08/01/2017  . Age-related osteoporosis without current pathological fracture 08/01/2017  . Left knee pain 09/19/2016  . Rectal benign neoplasm   . Asthma, chronic 04/04/2015  . history of Tubular adenoma of colon 04/04/2015  . CKD (chronic kidney disease) stage 3, GFR 30-59 ml/min (HCC) 04/03/2015  . Edema 04/14/2014  . Chronic systolic heart failure ([place]) 11/20/2013  . Lymphedema of lower extremity 10/17/2013  . 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 MGUS (monoclonal gammopathy of unknown significance) 05/15/2012  . Vitamin D deficiency 01/29/2010  . DERMATITIS 09/30/2009  . Paranoid delusion ([place]) 08/02/2008  . Obstructive chronic bronchitis ([place]) 08/02/2008  . Essential hypertension 11/11/2007  . Venous (peripheral) insufficiency 11/11/2007  . RENAL  CALCULUS 11/11/2007  . LOW BACK PAIN, CHRONIC 11/11/2007    Andrey Spearman, MS, OTR/L, [facility] 01/04/21 12:38 PM   [place] MAIN [facility] SERVICES [address] [place], [place], [address] Phone: [phone]   Fax:  [phone]  Name: GLADIOLA MADORE MRN: [id number] Date of Birth: [birth year]

## 2021-01-05 ENCOUNTER — Encounter: Payer: Medicare Other | Admitting: Occupational Therapy

## 2021-01-12 ENCOUNTER — Other Ambulatory Visit: Payer: Self-pay

## 2021-01-12 ENCOUNTER — Ambulatory Visit: Payer: Medicare Other | Admitting: Occupational Therapy

## 2021-01-12 ENCOUNTER — Encounter: Payer: Medicare Other | Admitting: Occupational Therapy

## 2021-01-12 DIAGNOSIS — I890 Lymphedema, not elsewhere classified: Secondary | ICD-10-CM

## 2021-01-13 NOTE — Therapy (Signed)
[place] MAIN [facility] SERVICES [address] [place], [place], [address] Phone: [phone]   Fax:  [phone]  Occupational Therapy Treatment  Patient Details  Name: [name] MRN: [id number] Date of Birth: Dec 07, 1931 Referring Provider (OT): Sandrea Hughs, NP   Encounter Date: 01/12/2021   OT End of Session - 01/12/21 1446    Visit Number 21    Number of Visits 36    Date for OT Re-Evaluation 03/01/21    Authorization Time Period 09/28/20 - 01/03/21    OT Start Time 0300    OT Stop Time 0410    OT Time Calculation (min) 70 min    Activity Tolerance Patient tolerated treatment well;No increased pain;Patient limited by pain    Behavior During Therapy [facility] for tasks assessed/performed           Past Medical History:  Diagnosis Date  . Anemia, unspecified   . Anxiety   . Arthritis   . Asthma   . Delusions ([place])   . Depression   . Edema 2005   "since put the glass in them"   . GERD (gastroesophageal reflux disease)   . Lumbago   . Lymphedema   . Obstructive chronic bronchitis without exacerbation ([place])   . Other abnormal glucose   . 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[place]) 04/04/2013  . Systolic heart failure (HCC)    EF 35 to 40% per echo January 2015  . Unspecified essential hypertension   . Unspecified nonpsychotic mental disorder   . Unspecified venous (peripheral) insufficiency   . Unspecified venous (peripheral) insufficiency   . Unspecified vitamin D deficiency     Past Surgical History:  Procedure Laterality Date  . APPENDECTOMY    . CERVICAL SPINE SURGERY     x 2  . CHOLECYSTECTOMY    . ESOPHAGOGASTRODUODENOSCOPY N/A 04/05/2015   Procedure: ESOPHAGOGASTRODUODENOSCOPY (EGD);  Surgeon: [name], MD;  Location: [facility];  Service: Endoscopy;  Laterality: N/A;  . FLEXIBLE SIGMOIDOSCOPY N/A 04/05/2015   Procedure: FLEXIBLE SIGMOIDOSCOPY;  Surgeon: [name], MD;  Location: [facility];  Service: Endoscopy;   Laterality: N/A;  . HAND SURGERY    . LUMBAR DISC SURGERY    . PARTIAL HYSTERECTOMY    . TONSILLECTOMY      There were no vitals filed for this visit.   Subjective Assessment - 01/12/21 1446    Subjective  [name] presents for OT Rx visit 21//36 to address BLE lymphedma. Pt is accompanied by her nephew, [name]. Manufacturer's reps are here from [employer] to assist Pt with fitting knee length reduction kit garments obtained from another provider. They have the adjustable garments, but they were never fitted to patient, so she has never been able to use them.    Patient is accompanied by: Family member    Pertinent History Chronic obstructive bronchitis, CVI, anxiety , systoloc heart failure, asthma, arthritis, multiple spinal surgeries w/ chronic back pain, DM,    Limitations Difficulty walking, impaired standing tolerance, impaired transfers, decreased joint AROM at toes, ankles and knees, chronic leg swelling , chronic pain. increased infection and falls risks, cognitive impairment, generaized weakness    Repetition Increases Symptoms    Special Tests + Stemmer bilaterally    Patient Stated Goals reduce swelling and legs feel better    Pain Onset Other (comment)   >20 years  OT Treatments/Exercises (OP) - 01/12/21 1448      ADLs   ADL Education Given Yes      Manual Therapy   Manual Therapy Edema management    Edema Management Modified [employer] reduction kit to fit Pt so caregivers can use at home between visits.    Compression Bandaging Pt used [employer] garments after session. No wraps. She is able to doff them with extra time (moified independence).                  OT Education - 01/12/21 1450    Education provided Yes    Education Details Pt and family educated for positioning, donning and doffing adjustable CircAid reduction kit garments for use at home between visits. Pt and CG given multiple opportunities to practice.     Person(s) Educated Patient;Caregiver(s)    Methods Explanation;Demonstration    Comprehension Verbalized understanding;Returned demonstration;Need further instruction               OT Long Term Goals - 01/04/21 0955      OT LONG TERM GOAL #1   Title With maximum caregiver assistance Pt will be able to apply knee length, multi-layer, short stretch compression wraps daily from ankle to tibial tuberosity on one leg at a time using correct gradient techniques to return affected limb/s, as closely as possible, to premorbid size and shape, to limit leg pain and infection risk, and to improve safe functional mobility and ADLs performance. 01/03/21: Manufacturer's rep coming next week to assist with fitting adjustable, Velcro style , knee length compression leg wraps. Hopefi[place]ly Pt will be able to manage these more independently, and may be more do-able for caregivers. Drawback is they do not manage foot swelling well.    Baseline dependent    Time 4    Period Days    Status Not Met   DC goal.   Target Date --   DC goal     OT LONG TERM GOAL #2   Title With Maximum caregiver assistance Pt will be able to verbalize signs and symptoms of cellulitis infection and identify lymphedema precautions using printed resource (modified independence) for reference to decrease infection risk and limit LE progression over time.    Baseline dependent    Time 6    Period Days    Status Achieved      OT LONG TERM GOAL #3   Title With Maximum caregiver assistance Pt >85 % compliant with all daily, LE self-care protocols for home program, including simple self-manual lymphatic drainage (MLD), skin care, lymphatic pumping ther ex, skin care, and compression wraps and garments  to limit LE progression and further functional decline.    Baseline dependent    Time 12    Period Weeks    Status Not Met   DC goal. Pt lives alone and has no consistent CG assistance with home program components other than skin care.      OT LONG TERM GOAL #4   Baseline dependent    Time 12    Period Weeks    Status Not Met      OT LONG TERM GOAL #5   Title Pt to tolerate compression wraps/ garments in keeping w/ prescribed daily wear regime within 1 week of issue date to progress and retain clinical and functional gains and to limit LE progression.    Baseline dependent    Time 12    Period Weeks    Status Achieved   met for  LLE     OT LONG TERM GOAL #6   Title Pt to achieve at least 10% LLE limb volume reduction bilaterally during modified Intensive Phase CDT to reduce infection risk, risk of recurrent leg wounds, to improve functional performance of basic and instrumental ADLs, and to limit LE progression.    Baseline dependent    Time 12    Period Weeks    Status Partially Met   09/28/20 LLE belwo the knee (A-D) decreased by 8.4%. RLE not yet treated.                Plan - 01/13/21 0809    Clinical Impression Statement Manufacturer's reps from [employer] in clinic with [place] today to alter and fit existing CircAid Reduction Kit adjustable daytime compression  garment alternatives. Once altered remainder of visit devoted to Pt and family edu for fitting, donning and doffing garments. Patient able to remove Velcro straps to dff garment with extra time and verbal cues, Nephew, given skilled teaching and multiple opportunities to practice , is able to don garments with min A. Nephew will teach other family members to assist Pt w/ CircAids daily. We'll contnue to bandage legs at clinic visits. Cont OT 1 x weekly as per POC to assist with ongoing self management.    OT Occupational Profile and History Comprehensive Assessment- Review of records and extensive additional review of physical, cognitive, psychosocial history related to current functional performance    Occupational Profile and client history currently impacting functional performance dimensia, inconsistent CG assistance with removing wraps, providing skin care  and reapplying compression between clinical visits. S    Occupational performance deficits (Please refer to evaluation for details): ADL's;IADL's;Rest and Sleep;Play;Leisure;Social Participation;Other   body image   Body Structure / Function / Physical Skills ADL;Decreased knowledge of use of DME;Pain;Edema;Scar mobility;Decreased knowledge of precautions;Skin integrity    Rehab Potential Fair    Clinical Decision Making Multiple treatment options, significant modification of task necessary    Comorbidities Affecting Occupational Performance: Presence of comorbidities impacting occupational performance    Modification or Assistance to Complete Evaluation  Max significant modification of tasks or assist is necessary to complete    OT Frequency 1x / week    OT Duration 6 weeks   and PRN   OT Treatment/Interventions Self-care/ADL training;DME and/or AE instruction;Manual lymph drainage;Patient/family education;Compression bandaging;Therapeutic exercise;Manual Therapy;Other (comment)    Plan skin care to decrease infection risk and improve hydration    Recommended Other Services fit with adjustable , alternative compression wraps for optimal independence w/ donning and doffing. Cnsider CircAid with footpieces.    Consulted and Agree with Plan of Care Patient;Family member/caregiver           Patient will benefit from skilled therapeutic intervention in order to improve the following deficits and impairments:   Body Structure / Function / Physical Skills: ADL,Decreased knowledge of use of DME,Pain,Edema,Scar mobility,Decreased knowledge of precautions,Skin integrity       Visit Diagnosis: Lymphedema, not elsewhere classified    Problem List Patient Active Problem List   Diagnosis Date Noted  . Medication noncompliance due to cognitive impairment 10/17/2019  . Chronic pain of multiple joints 08/01/2017  . Age-related osteoporosis without current pathological fracture 08/01/2017  . Left  knee pain 09/19/2016  . Rectal benign neoplasm   . Asthma, chronic 04/04/2015  . history of Tubular adenoma of colon 04/04/2015  . CKD (chronic kidney disease) stage 3, GFR 30-59 ml/min (HCC) 04/03/2015  . Edema 04/14/2014  . Chronic  systolic heart failure ([place]) 11/20/2013  . Lymphedema of lower extremity 10/17/2013  . 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 MGUS (monoclonal gammopathy of unknown significance) 05/15/2012  . Vitamin D deficiency 01/29/2010  . DERMATITIS 09/30/2009  . Paranoid delusion ([place]) 08/02/2008  . Obstructive chronic bronchitis ([place]) 08/02/2008  . Essential hypertension 11/11/2007  . Venous (peripheral) insufficiency 11/11/2007  . RENAL CALCULUS 11/11/2007  . LOW BACK PAIN, CHRONIC 11/11/2007    Andrey Spearman, MS, OTR/L, [facility] 01/13/21 8:17 AM   [place] MAIN [facility] SERVICES [address] [place], [place], [address] Phone: [phone]   Fax:  [phone]  Name: [name] MRN: [id number] Date of Birth: 06-11-1932

## 2021-01-17 ENCOUNTER — Ambulatory Visit: Payer: Medicare Other | Admitting: Occupational Therapy

## 2021-01-17 ENCOUNTER — Other Ambulatory Visit: Payer: Self-pay

## 2021-01-19 ENCOUNTER — Encounter: Payer: Medicare Other | Admitting: Occupational Therapy

## 2021-01-24 ENCOUNTER — Other Ambulatory Visit: Payer: Self-pay

## 2021-01-24 ENCOUNTER — Ambulatory Visit: Payer: Medicare Other | Admitting: Occupational Therapy

## 2021-01-24 DIAGNOSIS — I890 Lymphedema, not elsewhere classified: Secondary | ICD-10-CM

## 2021-01-24 NOTE — Therapy (Signed)
[place] MAIN [facility] SERVICES [address] [place], [place], [address] Phone: [phone]   Fax:  [phone]  Occupational Therapy Treatment  Patient Details  Name: [name] MRN: [id number] Date of Birth: April 12, 1932 Referring Provider (OT): Sandrea Hughs, NP   Encounter Date: 01/24/2021   OT End of Session - 01/24/21 1501    Visit Number 22    Number of Visits 36    Date for OT Re-Evaluation 03/01/21    Authorization Time Period 09/28/20 - 01/03/21    OT Start Time 0250    OT Stop Time 0359    OT Time Calculation (min) 69 min    Activity Tolerance Patient tolerated treatment well;No increased pain;Patient limited by pain    Behavior During Therapy [facility] for tasks assessed/performed           Past Medical History:  Diagnosis Date  . Anemia, unspecified   . Anxiety   . Arthritis   . Asthma   . Delusions ([place])   . Depression   . Edema 2005   "since put the glass in them"   . GERD (gastroesophageal reflux disease)   . Lumbago   . Lymphedema   . Obstructive chronic bronchitis without exacerbation ([place])   . Other abnormal glucose   . 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 Systolic heart failure (HCC)    EF 35 to 40% per echo January 2015  . Unspecified essential hypertension   . Unspecified nonpsychotic mental disorder   . Unspecified venous (peripheral) insufficiency   . Unspecified venous (peripheral) insufficiency   . Unspecified vitamin D deficiency     Past Surgical History:  Procedure Laterality Date  . APPENDECTOMY    . CERVICAL SPINE SURGERY     x 2  . CHOLECYSTECTOMY    . ESOPHAGOGASTRODUODENOSCOPY N/A 04/05/2015   Procedure: ESOPHAGOGASTRODUODENOSCOPY (EGD);  Surgeon: [name], MD;  Location: [facility];  Service: Endoscopy;  Laterality: N/A;  . FLEXIBLE SIGMOIDOSCOPY N/A 04/05/2015   Procedure: FLEXIBLE SIGMOIDOSCOPY;  Surgeon: [name], MD;  Location: [facility];  Service: Endoscopy;   Laterality: N/A;  . HAND SURGERY    . LUMBAR DISC SURGERY    . PARTIAL HYSTERECTOMY    . TONSILLECTOMY      There were no vitals filed for this visit.   Subjective Assessment - 01/24/21 1502    Subjective  [name] presents for OT Rx visit 22/36 to address BLE lymphedma. Pt is accompanied by her nephew, [name]. [name] reports [employer] is now providiing 8 hours/wk   of in home assistance. Pt may be eligible for more. [name] states he feels confident in training caregiver to apply adjustable compression wraps fitted 2 weeks ago.    Patient is accompanied by: Family member    Pertinent History Chronic obstructive bronchitis, CVI, anxiety , systoloc heart failure, asthma, arthritis, multiple spinal surgeries w/ chronic back pain, DM,    Limitations Difficulty walking, impaired standing tolerance, impaired transfers, decreased joint AROM at toes, ankles and knees, chronic leg swelling , chronic pain. increased infection and falls risks, cognitive impairment, generaized weakness    Repetition Increases Symptoms    Special Tests + Stemmer bilaterally    Patient Stated Goals reduce swelling and legs feel better    Pain Onset Other (comment)   >20 years  OT Education - 01/24/21 1510    Education provided Yes    Education Details Pt and family educated for positioning, donning and doffing adjustable CircAid reduction kit garments for use at home between visits. Pt and CG given multiple opportunities to practice.    Person(s) Educated Patient;Caregiver(s)    Methods Explanation;Demonstration    Comprehension Verbalized understanding;Returned demonstration;Need further instruction               OT Long Term Goals - 01/04/21 0955      OT LONG TERM GOAL #1   Title With maximum caregiver assistance Pt will be able to apply knee length, multi-layer, short stretch compression wraps daily from ankle to tibial tuberosity on one leg at a time  using correct gradient techniques to return affected limb/s, as closely as possible, to premorbid size and shape, to limit leg pain and infection risk, and to improve safe functional mobility and ADLs performance. 01/03/21: Manufacturer's rep coming next week to assist with fitting adjustable, Velcro style , knee length compression leg wraps. Hopefi[place]ly Pt will be able to manage these more independently, and may be more do-able for caregivers. Drawback is they do not manage foot swelling well.    Baseline dependent    Time 4    Period Days    Status Not Met   DC goal.   Target Date --   DC goal     OT LONG TERM GOAL #2   Title With Maximum caregiver assistance Pt will be able to verbalize signs and symptoms of cellulitis infection and identify lymphedema precautions using printed resource (modified independence) for reference to decrease infection risk and limit LE progression over time.    Baseline dependent    Time 6    Period Days    Status Achieved      OT LONG TERM GOAL #3   Title With Maximum caregiver assistance Pt >85 % compliant with all daily, LE self-care protocols for home program, including simple self-manual lymphatic drainage (MLD), skin care, lymphatic pumping ther ex, skin care, and compression wraps and garments  to limit LE progression and further functional decline.    Baseline dependent    Time 12    Period Weeks    Status Not Met   DC goal. Pt lives alone and has no consistent CG assistance with home program components other than skin care.     OT LONG TERM GOAL #4   Baseline dependent    Time 12    Period Weeks    Status Not Met      OT LONG TERM GOAL #5   Title Pt to tolerate compression wraps/ garments in keeping w/ prescribed daily wear regime within 1 week of issue date to progress and retain clinical and functional gains and to limit LE progression.    Baseline dependent    Time 12    Period Weeks    Status Achieved   met for LLE     OT LONG TERM GOAL #6    Title Pt to achieve at least 10% LLE limb volume reduction bilaterally during modified Intensive Phase CDT to reduce infection risk, risk of recurrent leg wounds, to improve functional performance of basic and instrumental ADLs, and to limit LE progression.    Baseline dependent    Time 12    Period Weeks    Status Partially Met   09/28/20 LLE belwo the knee (A-D) decreased by 8.4%. RLE not yet treated.  Plan - 01/24/21 1604    Clinical Impression Statement BLE skin condition in imprved condition today with improving hydration and less flaking. Pt tolerated LLE MLD, skin care and  knee length gradient compression wraps to LLE  without pain. Applied CircAid to RLE over [place]. .Cont as per POC.    OT Occupational Profile and History Comprehensive Assessment- Review of records and extensive additional review of physical, cognitive, psychosocial history related to current functional performance    Occupational Profile and client history currently impacting functional performance dimensia, inconsistent CG assistance with removing wraps, providing skin care and reapplying compression between clinical visits. S    Occupational performance deficits (Please refer to evaluation for details): ADL's;IADL's;Rest and Sleep;Play;Leisure;Social Participation;Other   body image   Body Structure / Function / Physical Skills ADL;Decreased knowledge of use of DME;Pain;Edema;Scar mobility;Decreased knowledge of precautions;Skin integrity    Rehab Potential Fair    Clinical Decision Making Multiple treatment options, significant modification of task necessary    Comorbidities Affecting Occupational Performance: Presence of comorbidities impacting occupational performance    Modification or Assistance to Complete Evaluation  Max significant modification of tasks or assist is necessary to complete    OT Frequency 1x / week    OT Duration 6 weeks   and PRN   OT Treatment/Interventions  Self-care/ADL training;DME and/or AE instruction;Manual lymph drainage;Patient/family education;Compression bandaging;Therapeutic exercise;Manual Therapy;Other (comment)    Plan skin care to decrease infection risk and improve hydration    Recommended Other Services fit with adjustable , alternative compression wraps for optimal independence w/ donning and doffing. Cnsider CircAid with footpieces.    Consulted and Agree with Plan of Care Patient;Family member/caregiver           Patient will benefit from skilled therapeutic intervention in order to improve the following deficits and impairments:   Body Structure / Function / Physical Skills: ADL,Decreased knowledge of use of DME,Pain,Edema,Scar mobility,Decreased knowledge of precautions,Skin integrity       Visit Diagnosis: Lymphedema, not elsewhere classified    Problem List Patient Active Problem List   Diagnosis Date Noted  . Medication noncompliance due to cognitive impairment 10/17/2019  . Chronic pain of multiple joints 08/01/2017  . Age-related osteoporosis without current pathological fracture 08/01/2017  . Left knee pain 09/19/2016  . Rectal benign neoplasm   . Asthma, chronic 04/04/2015  . history of Tubular adenoma of colon 04/04/2015  . CKD (chronic kidney disease) stage 3, GFR 30-59 ml/min (HCC) 04/03/2015  . Edema 04/14/2014  . Chronic systolic heart failure ([place]) 11/20/2013  . Lymphedema of lower extremity 10/17/2013  . 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 MGUS (monoclonal gammopathy of unknown significance) 05/15/2012  . Vitamin D deficiency 01/29/2010  . DERMATITIS 09/30/2009  . Paranoid delusion ([place]) 08/02/2008  . Obstructive chronic bronchitis ([place]) 08/02/2008  . Essential hypertension 11/11/2007  . Venous (peripheral) insufficiency 11/11/2007  . RENAL CALCULUS 11/11/2007  . LOW BACK PAIN, CHRONIC 11/11/2007    Andrey Spearman, MS, OTR/L, [facility] 01/24/21 4:10 PM  [place] MAIN [facility] SERVICES [address] [place], [place], [address] Phone: [phone]   Fax:  [phone]  Name: [name] MRN: [id number] Date of Birth: 06-Jul-1932

## 2021-01-26 ENCOUNTER — Encounter: Payer: Medicare Other | Admitting: Occupational Therapy

## 2021-01-28 ENCOUNTER — Other Ambulatory Visit: Payer: Self-pay | Admitting: Nurse Practitioner

## 2021-01-28 DIAGNOSIS — D509 Iron deficiency anemia, unspecified: Secondary | ICD-10-CM

## 2021-02-01 ENCOUNTER — Ambulatory Visit: Payer: Medicare Other | Attending: Family | Admitting: Occupational Therapy

## 2021-02-01 ENCOUNTER — Other Ambulatory Visit: Payer: Self-pay

## 2021-02-01 DIAGNOSIS — I890 Lymphedema, not elsewhere classified: Secondary | ICD-10-CM | POA: Diagnosis not present

## 2021-02-01 NOTE — Therapy (Signed)
[name] [address] [place], [place], [address] Phone: [phone]   Fax:  [phone]  Occupational Therapy Treatment  Patient Details  Name: [name] MRN: [id number] Date of Birth: Jun 23, 1932 Referring Provider (OT): Sophia Hughs, NP   Encounter Date: 02/01/2021   OT End of Session - 02/01/21 1656    Visit Number 23    Number of Visits 36    Date for OT Re-Evaluation 03/01/21    Authorization Time Period 09/28/20 - 01/03/21    OT Start Time 0315    OT Stop Time 0410    OT Time Calculation (min) 55 min    Activity Tolerance Patient tolerated treatment well;No increased pain;Patient limited by pain    Behavior During Therapy [name] for tasks assessed/performed           Past Medical History:  Diagnosis Date  . Anemia, unspecified   . Anxiety   . Arthritis   . Asthma   . Delusions ([name])   . Depression   . Edema 2005   "since put the glass in them"   . GERD (gastroesophageal reflux disease)   . Lumbago   . Lymphedema   . Obstructive chronic bronchitis without exacerbation ([place] [facility])   . Other abnormal glucose   . 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[place]) 04/04/2013  . Systolic heart failure (HCC)    EF 35 to 40% per echo January 2015  . Unspecified essential hypertension   . Unspecified nonpsychotic mental disorder   . Unspecified venous (peripheral) insufficiency   . Unspecified venous (peripheral) insufficiency   . Unspecified vitamin D deficiency     Past Surgical History:  Procedure Laterality Date  . APPENDECTOMY    . CERVICAL SPINE SURGERY     x 2  . CHOLECYSTECTOMY    . ESOPHAGOGASTRODUODENOSCOPY N/A 04/05/2015   Procedure: ESOPHAGOGASTRODUODENOSCOPY (EGD);  Surgeon: [name], MD;  Location: [facility];  Service: Endoscopy;  Laterality: N/A;  . FLEXIBLE SIGMOIDOSCOPY N/A 04/05/2015   Procedure: FLEXIBLE SIGMOIDOSCOPY;  Surgeon: [name], MD;  Location: [facility];  Service: Endoscopy;   Laterality: N/A;  . HAND SURGERY    . LUMBAR DISC SURGERY    . PARTIAL HYSTERECTOMY    . TONSILLECTOMY      There were no vitals filed for this visit.   Subjective Assessment - 02/01/21 1659    Subjective  [name] presents for OT Rx visit 23/36 to address BLE lymphedma. Pt is accompanied by her nephew, [name]. Pt and CG report Pt is using CircAids on both legs between visits more frequently with growing tolerance. Pt reports Pain is unchanged and ungoing. She does not rate pain numerically today.    Patient is accompanied by: Family member    Pertinent History Chronic obstructive bronchitis, CVI, anxiety , systoloc heart failure, asthma, arthritis, multiple spinal surgeries w/ chronic back pain, DM,    Limitations Difficulty walking, impaired standing tolerance, impaired transfers, decreased joint AROM at toes, ankles and knees, chronic leg swelling , chronic pain. increased infection and falls risks, cognitive impairment, generaized weakness    Repetition Increases Symptoms    Special Tests + Stemmer bilaterally    Patient Stated Goals reduce swelling and legs feel better    Pain Onset Other (comment)   >20 years                       OT Treatments/Exercises (OP) - 02/01/21 1700  ADLs   ADL Education Given Yes      Manual Therapy   Manual Therapy Edema management;Manual Lymphatic Drainage (MLD);Compression Bandaging    Manual Lymphatic Drainage (MLD) MLD to LLE as established. Pants worn to clinic severly limit access to inguinal LN      for proximal MLD.    Compression Bandaging Multilayer, gradient compression wraps applied to LLE using 1 each of short stretch bandage 8, 10 and 12 cm wide over single layer of fRosidal oam and soft cotton stockinett. from base of toes to tibial tuberosity. CircAid adjustable knee length garment alternative applied to RLE using  ~ 30-40 mmHg.    Other Manual Therapy skin care to LLE with low ph castor oil concurrent w MLD                   OT Education - 02/01/21 1702    Education provided Yes    Education Details Continued Pt/ CG edu for lymphedema self care  and home program throughout session. Topics include multilayer, gradient compression wrapping, simple self-MLD, therapeutic lymphatic pumping exercises, skin/nail care, risk reduction factors and LE precautions, compression garments/recommendations and wear and care schedule and compression garment donning / doffing using assistive devices. All questions answered to the Pt's satisfaction, and Pt demonstrates understanding by report.    Person(s) Educated Patient;Caregiver(s)    Methods Explanation;Demonstration    Comprehension Verbalized understanding;Returned demonstration;Need further instruction               OT Long Term Goals - 01/04/21 0955      OT LONG TERM GOAL #1   Title With maximum caregiver assistance Pt will be able to apply knee length, multi-layer, short stretch compression wraps daily from ankle to tibial tuberosity on one leg at a time using correct gradient techniques to return affected limb/s, as closely as possible, to premorbid size and shape, to limit leg pain and infection risk, and to improve safe functional mobility and ADLs performance. 01/03/21: Manufacturer's rep coming next week to assist with fitting adjustable, Velcro style , knee length compression leg wraps. Hopefiullly Pt will be able to manage these more independently, and may be more do-able for caregivers. Drawback is they do not manage foot swelling well.    Baseline dependent    Time 4    Period Days    Status Not Met   DC goal.   Target Date --   DC goal     OT LONG TERM GOAL #2   Title With Maximum caregiver assistance Pt will be able to verbalize signs and symptoms of cellulitis infection and identify lymphedema precautions using printed resource (modified independence) for reference to decrease infection risk and limit LE progression over time.     Baseline dependent    Time 6    Period Days    Status Achieved      OT LONG TERM GOAL #3   Title With Maximum caregiver assistance Pt >85 % compliant with all daily, LE self-care protocols for home program, including simple self-manual lymphatic drainage (MLD), skin care, lymphatic pumping ther ex, skin care, and compression wraps and garments  to limit LE progression and further functional decline.    Baseline dependent    Time 12    Period Weeks    Status Not Met   DC goal. Pt lives alone and has no consistent CG assistance with home program components other than skin care.     OT LONG TERM GOAL #4  Baseline dependent    Time 12    Period Weeks    Status Not Met      OT LONG TERM GOAL #5   Title Pt to tolerate compression wraps/ garments in keeping w/ prescribed daily wear regime within 1 week of issue date to progress and retain clinical and functional gains and to limit LE progression.    Baseline dependent    Time 12    Period Weeks    Status Achieved   met for LLE     OT LONG TERM GOAL #6   Title Pt to achieve at least 10% LLE limb volume reduction bilaterally during modified Intensive Phase CDT to reduce infection risk, risk of recurrent leg wounds, to improve functional performance of basic and instrumental ADLs, and to limit LE progression.    Baseline dependent    Time 12    Period Weeks    Status Partially Met   09/28/20 LLE belwo the knee (A-D) decreased by 8.4%. RLE not yet treated.                Plan - 02/01/21 1657    Clinical Impression Statement Mrs Lemoine tolerated MLD, skin care and compression wrapping today without increased pain. She is using CircAids with family caregiver's assistance between visits more frequently by report., and they are "getting easier". Cont OT 1 x weekly as per POC to assist with ongoing self management.    OT Occupational Profile and History Comprehensive Assessment- Review of records and extensive additional review of  physical, cognitive, psychosocial history related to current functional performance    Occupational Profile and client history currently impacting functional performance dimensia, inconsistent CG assistance with removing wraps, providing skin care and reapplying compression between clinical visits. S    Occupational performance deficits (Please refer to evaluation for details): ADL's;IADL's;Rest and Sleep;Play;Leisure;Social Participation;Other   body image   Body Structure / Function / Physical Skills ADL;Decreased knowledge of use of DME;Pain;Edema;Scar mobility;Decreased knowledge of precautions;Skin integrity    Rehab Potential Fair    Clinical Decision Making Multiple treatment options, significant modification of task necessary    Comorbidities Affecting Occupational Performance: Presence of comorbidities impacting occupational performance    Modification or Assistance to Complete Evaluation  Max significant modification of tasks or assist is necessary to complete    OT Frequency 1x / week    OT Duration 6 weeks   and PRN   OT Treatment/Interventions Self-care/ADL training;DME and/or AE instruction;Manual lymph drainage;Patient/family education;Compression bandaging;Therapeutic exercise;Manual Therapy;Other (comment)    Plan skin care to decrease infection risk and improve hydration    Recommended Other Daniel fit with adjustable , alternative compression wraps for optimal independence w/ donning and doffing. Cnsider CircAid with footpieces.    Consulted and Agree with Plan of Care Patient;Family member/caregiver           Patient will benefit from skilled therapeutic intervention in order to improve the following deficits and impairments:   Body Structure / Function / Physical Skills: ADL,Decreased knowledge of use of DME,Pain,Edema,Scar mobility,Decreased knowledge of precautions,Skin integrity       Visit Diagnosis: Lymphedema, not elsewhere classified    Problem  List Patient Active Problem List   Diagnosis Date Noted  . Medication noncompliance due to cognitive impairment 10/17/2019  . Chronic pain of multiple joints 08/01/2017  . Age-related osteoporosis without current pathological fracture 08/01/2017  . Left knee pain 09/19/2016  . Rectal benign neoplasm   . Asthma, chronic 04/04/2015  . history  of Tubular adenoma of colon 04/04/2015  . CKD (chronic kidney disease) stage 3, GFR 30-59 ml/min (HCC) 04/03/2015  . Edema 04/14/2014  . Chronic systolic heart failure ([place]) 11/20/2013  . Lymphedema of lower extremity 10/17/2013  . 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 MGUS (monoclonal gammopathy of unknown significance) 05/15/2012  . Vitamin D deficiency 01/29/2010  . DERMATITIS 09/30/2009  . Paranoid delusion ([place]) 08/02/2008  . Obstructive chronic bronchitis ([place]) 08/02/2008  . Essential hypertension 11/11/2007  . Venous (peripheral) insufficiency 11/11/2007  . RENAL CALCULUS 11/11/2007  . LOW BACK PAIN, CHRONIC 11/11/2007    Andrey Spearman, MS, OTR/L, [facility] 02/01/21 5:03 PM  [place] MAIN [facility] [address] [place], [place], [address] Phone: [phone]   Fax:  [phone]  Name: [name] MRN: [id number] Date of Birth: 1932/05/28

## 2021-02-07 ENCOUNTER — Other Ambulatory Visit: Payer: Self-pay

## 2021-02-07 ENCOUNTER — Ambulatory Visit: Payer: Medicare Other | Admitting: Occupational Therapy

## 2021-02-07 DIAGNOSIS — I890 Lymphedema, not elsewhere classified: Secondary | ICD-10-CM

## 2021-02-07 NOTE — Therapy (Signed)
[place] MAIN [facility] SERVICES [address] [place], [place], [address] Phone: [phone]   Fax:  [phone]  Occupational Therapy Treatment  Patient Details  Name: [name] MRN: [id number] Date of Birth: [birth year] Referring Provider (OT): Sandrea Hughs, NP   Encounter Date: 02/07/2021   OT End of Session - 02/07/21 1510    Visit Number 24    Number of Visits 36    Date for OT Re-Evaluation 03/01/21    Authorization Time Period 09/28/20 - 01/03/21    OT Start Time 0304    OT Stop Time 0415    OT Time Calculation (min) 71 min    Activity Tolerance Patient tolerated treatment well;No increased pain;Patient limited by pain    Behavior During Therapy [facility] for tasks assessed/performed           Past Medical History:  Diagnosis Date  . Anemia, unspecified   . Anxiety   . Arthritis   . Asthma   . Delusions ([place])   . Depression   . Edema 2005   "since put the glass in them"   . GERD (gastroesophageal reflux disease)   . Lumbago   . Lymphedema   . Obstructive chronic bronchitis without exacerbation ([place])   . Other abnormal glucose   . 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 Systolic heart failure (HCC)    EF 35 to 40% per echo January 2015  . Unspecified essential hypertension   . Unspecified nonpsychotic mental disorder   . Unspecified venous (peripheral) insufficiency   . Unspecified venous (peripheral) insufficiency   . Unspecified vitamin D deficiency     Past Surgical History:  Procedure Laterality Date  . APPENDECTOMY    . CERVICAL SPINE SURGERY     x 2  . CHOLECYSTECTOMY    . ESOPHAGOGASTRODUODENOSCOPY N/A 04/05/2015   Procedure: ESOPHAGOGASTRODUODENOSCOPY (EGD);  Surgeon: [name], MD;  Location: [facility];  Service: Endoscopy;  Laterality: N/A;  . FLEXIBLE SIGMOIDOSCOPY N/A 04/05/2015   Procedure: FLEXIBLE SIGMOIDOSCOPY;  Surgeon: [name], MD;  Location: [facility];  Service: Endoscopy;   Laterality: N/A;  . HAND SURGERY    . LUMBAR DISC SURGERY    . PARTIAL HYSTERECTOMY    . TONSILLECTOMY      There were no vitals filed for this visit.   Subjective Assessment - 02/07/21 1511    Subjective  [name] presents for OT Rx visit 24/36 to address BLE lymphedma. Pt is accompanied by her nephew, [name]. Pt reports, "my legs are swollen a little." When asked if she was in compression between visits Pt stated, "Not really". Pt reports 7/10 pain    Patient is accompanied by: Family member    Pertinent History Chronic obstructive bronchitis, CVI, anxiety , systoloc heart failure, asthma, arthritis, multiple spinal surgeries w/ chronic back pain, DM,    Limitations Difficulty walking, impaired standing tolerance, impaired transfers, decreased joint AROM at toes, ankles and knees, chronic leg swelling , chronic pain. increased infection and falls risks, cognitive impairment, generaized weakness    Repetition Increases Symptoms    Special Tests + Stemmer bilaterally    Patient Stated Goals reduce swelling and legs feel better    Pain Onset Other (comment)   >20 years                       OT Treatments/Exercises (OP) - 02/07/21 1639      ADLs   ADL Education  Given Yes      Manual Therapy   Manual Therapy Edema management    Manual Lymphatic Drainage (MLD) MLD to LLE as established. Pants worn to clinic severly limit access to inguinal LN      for proximal MLD.    Compression Bandaging Multilayer, gradient compression wraps applied to LLE using 1 each of short stretch bandage 8, 10 and 12 cm wide over single layer of fRosidal oam and soft cotton stockinett. from base of toes to tibial tuberosity. CircAid adjustable knee length garment alternative applied to RLE using  ~ 30-40 mmHg.    Other Manual Therapy skin care to LLE with low ph castor oil concurrent w MLD                  OT Education - 02/07/21 1640    Education provided Yes    Education Details  Continued Pt/ CG edu for lymphedema self care  and home program throughout session. Topics include multilayer, gradient compression wrapping, simple self-MLD, therapeutic lymphatic pumping exercises, skin/nail care, risk reduction factors and LE precautions, compression garments/recommendations and wear and care schedule and compression garment donning / doffing using assistive devices. All questions answered to the Pt's satisfaction, and Pt demonstrates understanding by report.    Person(s) Educated Patient;Caregiver(s)    Methods Explanation;Demonstration    Comprehension Verbalized understanding;Returned demonstration;Need further instruction               OT Long Term Goals - 01/04/21 0955      OT LONG TERM GOAL #1   Title With maximum caregiver assistance Pt will be able to apply knee length, multi-layer, short stretch compression wraps daily from ankle to tibial tuberosity on one leg at a time using correct gradient techniques to return affected limb/s, as closely as possible, to premorbid size and shape, to limit leg pain and infection risk, and to improve safe functional mobility and ADLs performance. 01/03/21: Manufacturer's rep coming next week to assist with fitting adjustable, Velcro style , knee length compression leg wraps. Hopefiullly Pt will be able to manage these more independently, and may be more do-able for caregivers. Drawback is they do not manage foot swelling well.    Baseline dependent    Time 4    Period Days    Status Not Met   DC goal.   Target Date --   DC goal     OT LONG TERM GOAL #2   Title With Maximum caregiver assistance Pt will be able to verbalize signs and symptoms of cellulitis infection and identify lymphedema precautions using printed resource (modified independence) for reference to decrease infection risk and limit LE progression over time.    Baseline dependent    Time 6    Period Days    Status Achieved      OT LONG TERM GOAL #3   Title With  Maximum caregiver assistance Pt >85 % compliant with all daily, LE self-care protocols for home program, including simple self-manual lymphatic drainage (MLD), skin care, lymphatic pumping ther ex, skin care, and compression wraps and garments  to limit LE progression and further functional decline.    Baseline dependent    Time 12    Period Weeks    Status Not Met   DC goal. Pt lives alone and has no consistent CG assistance with home program components other than skin care.     OT LONG TERM GOAL #4   Baseline dependent    Time 12  Period Weeks    Status Not Met      OT LONG TERM GOAL #5   Title Pt to tolerate compression wraps/ garments in keeping w/ prescribed daily wear regime within 1 week of issue date to progress and retain clinical and functional gains and to limit LE progression.    Baseline dependent    Time 12    Period Weeks    Status Achieved   met for LLE     OT LONG TERM GOAL #6   Title Pt to achieve at least 10% LLE limb volume reduction bilaterally during modified Intensive Phase CDT to reduce infection risk, risk of recurrent leg wounds, to improve functional performance of basic and instrumental ADLs, and to limit LE progression.    Baseline dependent    Time 12    Period Weeks    Status Partially Met   09/28/20 LLE belwo the knee (A-D) decreased by 8.4%. RLE not yet treated.                Plan - 02/07/21 1635    Clinical Impression Statement Bilateral swelling observed below the knees and extending to base of toes. Ot knows od no precipitationg event/s . Good tolerance for MLD and concurrent skin care with low ph castor oil to LLE/LLQ to imptove hydraton.Provided Pt and CG edu re a variety of stockinett liners for use with CircAid that ZPT can buy in bulk at reduced price. Cont 1 x weekly onlgoing.    OT Occupational Profile and History Comprehensive Assessment- Review of records and extensive additional review of physical, cognitive, psychosocial history  related to current functional performance    Occupational Profile and client history currently impacting functional performance dimensia, inconsistent CG assistance with removing wraps, providing skin care and reapplying compression between clinical visits. S    Occupational performance deficits (Please refer to evaluation for details): ADL's;IADL's;Rest and Sleep;Play;Leisure;Social Participation;Other   body image   Body Structure / Function / Physical Skills ADL;Decreased knowledge of use of DME;Pain;Edema;Scar mobility;Decreased knowledge of precautions;Skin integrity    Rehab Potential Fair    Clinical Decision Making Multiple treatment options, significant modification of task necessary    Comorbidities Affecting Occupational Performance: Presence of comorbidities impacting occupational performance    Modification or Assistance to Complete Evaluation  Max significant modification of tasks or assist is necessary to complete    OT Frequency 1x / week    OT Duration 6 weeks   and PRN   OT Treatment/Interventions Self-care/ADL training;DME and/or AE instruction;Manual lymph drainage;Patient/family education;Compression bandaging;Therapeutic exercise;Manual Therapy;Other (comment)    Plan skin care to decrease infection risk and improve hydration    Recommended Other Services fit with adjustable , alternative compression wraps for optimal independence w/ donning and doffing. Cnsider CircAid with footpieces.    Consulted and Agree with Plan of Care Patient;Family member/caregiver           Patient will benefit from skilled therapeutic intervention in order to improve the following deficits and impairments:   Body Structure / Function / Physical Skills: ADL,Decreased knowledge of use of DME,Pain,Edema,Scar mobility,Decreased knowledge of precautions,Skin integrity       Visit Diagnosis: Lymphedema, not elsewhere classified    Problem List Patient Active Problem List   Diagnosis Date  Noted  . Medication noncompliance due to cognitive impairment 10/17/2019  . Chronic pain of multiple joints 08/01/2017  . Age-related osteoporosis without current pathological fracture 08/01/2017  . Left knee pain 09/19/2016  . Rectal benign neoplasm   .  Asthma, chronic 04/04/2015  . history of Tubular adenoma of colon 04/04/2015  . CKD (chronic kidney disease) stage 3, GFR 30-59 ml/min (HCC) 04/03/2015  . Edema 04/14/2014  . Chronic systolic heart failure ([place]) 11/20/2013  . Lymphedema of lower extremity 10/17/2013  . 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 MGUS (monoclonal gammopathy of unknown significance) 05/15/2012  . Vitamin D deficiency 01/29/2010  . DERMATITIS 09/30/2009  . Paranoid delusion ([place]) 08/02/2008  . Obstructive chronic bronchitis ([place]) 08/02/2008  . Essential hypertension 11/11/2007  . Venous (peripheral) insufficiency 11/11/2007  . RENAL CALCULUS 11/11/2007  . LOW BACK PAIN, CHRONIC 11/11/2007    Andrey Spearman, MS, OTR/L, [facility] 02/07/21 4:42 PM  [place] MAIN [facility] SERVICES [address] [place], [place], [address] Phone: [phone]   Fax:  [phone]  Name: [name] MRN: [id number] Date of Birth: [birth year]

## 2021-02-14 ENCOUNTER — Other Ambulatory Visit: Payer: Self-pay

## 2021-02-14 ENCOUNTER — Ambulatory Visit: Payer: Medicare Other | Admitting: Occupational Therapy

## 2021-02-14 DIAGNOSIS — I890 Lymphedema, not elsewhere classified: Secondary | ICD-10-CM | POA: Diagnosis not present

## 2021-02-14 NOTE — Therapy (Signed)
[place] MAIN [facility] SERVICES [address] [place], [place], [address] Phone: [phone]   Fax:  [phone]  Occupational Therapy Treatment  Patient Details  Name: [name] MRN: [id number] Date of Birth: [birth year] Referring Provider (OT): Sandrea Hughs, NP   Encounter Date: 02/14/2021   OT End of Session - 02/14/21 1520    Visit Number 25    Number of Visits 36    Date for OT Re-Evaluation 03/01/21    Authorization Time Period 09/28/20 - 01/03/21    OT Start Time 0310    OT Stop Time 0413    OT Time Calculation (min) 63 min    Activity Tolerance Patient tolerated treatment well;No increased pain;Patient limited by pain    Behavior During Therapy [facility] for tasks assessed/performed           Past Medical History:  Diagnosis Date  . Anemia, unspecified   . Anxiety   . Arthritis   . Asthma   . Delusions ([place])   . Depression   . Edema 2005   "since put the glass in them"   . GERD (gastroesophageal reflux disease)   . Lumbago   . Lymphedema   . Obstructive chronic bronchitis without exacerbation ([place])   . Other abnormal glucose   . 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[place]) 04/04/2013  . Systolic heart failure (HCC)    EF 35 to 40% per echo January 2015  . Unspecified essential hypertension   . Unspecified nonpsychotic mental disorder   . Unspecified venous (peripheral) insufficiency   . Unspecified venous (peripheral) insufficiency   . Unspecified vitamin D deficiency     Past Surgical History:  Procedure Laterality Date  . APPENDECTOMY    . CERVICAL SPINE SURGERY     x 2  . CHOLECYSTECTOMY    . ESOPHAGOGASTRODUODENOSCOPY N/A 04/05/2015   Procedure: ESOPHAGOGASTRODUODENOSCOPY (EGD);  Surgeon: [name], MD;  Location: [facility];  Service: Endoscopy;  Laterality: N/A;  . FLEXIBLE SIGMOIDOSCOPY N/A 04/05/2015   Procedure: FLEXIBLE SIGMOIDOSCOPY;  Surgeon: [name], MD;  Location: [facility];  Service: Endoscopy;   Laterality: N/A;  . HAND SURGERY    . LUMBAR DISC SURGERY    . PARTIAL HYSTERECTOMY    . TONSILLECTOMY      There were no vitals filed for this visit.   Subjective Assessment - 02/14/21 1617    Subjective  [name] presents for OT Rx visit 25/36 to address BLE lymphedma. Pt is accompanied by her nephew, [name]. Pt reports 4/10 pain below the knees bilaterally.    Patient is accompanied by: Family member    Pertinent History Chronic obstructive bronchitis, CVI, anxiety , systoloc heart failure, asthma, arthritis, multiple spinal surgeries w/ chronic back pain, DM,    Limitations Difficulty walking, impaired standing tolerance, impaired transfers, decreased joint AROM at toes, ankles and knees, chronic leg swelling , chronic pain. increased infection and falls risks, cognitive impairment, generaized weakness    Repetition Increases Symptoms    Special Tests + Stemmer bilaterally    Patient Stated Goals reduce swelling and legs feel better    Pain Onset Other (comment)   >20 years                       OT Treatments/Exercises (OP) - 02/14/21 1618      ADLs   ADL Education Given Yes      Manual Therapy   Manual Therapy Edema management;Manual Lymphatic Drainage (  MLD);Compression Bandaging    Manual Lymphatic Drainage (MLD) MLD to LLE as established. Pants worn to clinic severly limit access to inguinal LN      for proximal MLD.    Compression Bandaging Multilayer, gradient compression wraps applied to LLE using 1 each of short stretch bandage 8, 10 and 12 cm wide over single layer of fRosidal oam and soft cotton stockinett. from base of toes to tibial tuberosity. CircAid adjustable knee length garment alternative applied to RLE using  ~ 30-40 mmHg.    Other Manual Therapy skin care to LLE with low ph castor oil concurrent w MLD                  OT Education - 02/14/21 1622    Education provided Yes    Education Details Continued Pt/ CG edu for lymphedema  self care  and home program throughout session. Topics include multilayer, gradient compression wrapping, simple self-MLD, therapeutic lymphatic pumping exercises, skin/nail care, risk reduction factors and LE precautions, compression garments/recommendations and wear and care schedule and compression garment donning / doffing using assistive devices. All questions answered to the Pt's satisfaction, and Pt demonstrates understanding by report.    Person(s) Educated Patient;Caregiver(s)    Methods Explanation;Demonstration    Comprehension Verbalized understanding;Returned demonstration;Need further instruction               OT Long Term Goals - 01/04/21 0955      OT LONG TERM GOAL #1   Title With maximum caregiver assistance Pt will be able to apply knee length, multi-layer, short stretch compression wraps daily from ankle to tibial tuberosity on one leg at a time using correct gradient techniques to return affected limb/s, as closely as possible, to premorbid size and shape, to limit leg pain and infection risk, and to improve safe functional mobility and ADLs performance. 01/03/21: Manufacturer's rep coming next week to assist with fitting adjustable, Velcro style , knee length compression leg wraps. Hopefiullly Pt will be able to manage these more independently, and may be more do-able for caregivers. Drawback is they do not manage foot swelling well.    Baseline dependent    Time 4    Period Days    Status Not Met   DC goal.   Target Date --   DC goal     OT LONG TERM GOAL #2   Title With Maximum caregiver assistance Pt will be able to verbalize signs and symptoms of cellulitis infection and identify lymphedema precautions using printed resource (modified independence) for reference to decrease infection risk and limit LE progression over time.    Baseline dependent    Time 6    Period Days    Status Achieved      OT LONG TERM GOAL #3   Title With Maximum caregiver assistance Pt >85 %  compliant with all daily, LE self-care protocols for home program, including simple self-manual lymphatic drainage (MLD), skin care, lymphatic pumping ther ex, skin care, and compression wraps and garments  to limit LE progression and further functional decline.    Baseline dependent    Time 12    Period Weeks    Status Not Met   DC goal. Pt lives alone and has no consistent CG assistance with home program components other than skin care.     OT LONG TERM GOAL #4   Baseline dependent    Time 12    Period Weeks    Status Not Met  OT LONG TERM GOAL #5   Title Pt to tolerate compression wraps/ garments in keeping w/ prescribed daily wear regime within 1 week of issue date to progress and retain clinical and functional gains and to limit LE progression.    Baseline dependent    Time 12    Period Weeks    Status Achieved   met for LLE     OT LONG TERM GOAL #6   Title Pt to achieve at least 10% LLE limb volume reduction bilaterally during modified Intensive Phase CDT to reduce infection risk, risk of recurrent leg wounds, to improve functional performance of basic and instrumental ADLs, and to limit LE progression.    Baseline dependent    Time 12    Period Weeks    Status Partially Met   09/28/20 LLE belwo the knee (A-D) decreased by 8.4%. RLE not yet treated.                Plan - 02/14/21 1614    Clinical Impression Statement Continued MLD, skin care and gradient compression to LLE below the knee. Applied compression bandages to LL and alternative  CircAid wraps to RLE without increased pain. Skin on dorsal feet is peeling and cracked, as has been typical for this patient over time. Signs/ symptoms of infection are absent. Cont to support patient for skilled lymphedema care to limit infection risk and progression of this chronic condition. Without ongoing OT support condition will worsen and further functional decline is expected.    OT Occupational Profile and History  Comprehensive Assessment- Review of records and extensive additional review of physical, cognitive, psychosocial history related to current functional performance    Occupational Profile and client history currently impacting functional performance dimensia, inconsistent CG assistance with removing wraps, providing skin care and reapplying compression between clinical visits. S    Occupational performance deficits (Please refer to evaluation for details): ADL's;IADL's;Rest and Sleep;Play;Leisure;Social Participation;Other   body image   Body Structure / Function / Physical Skills ADL;Decreased knowledge of use of DME;Pain;Edema;Scar mobility;Decreased knowledge of precautions;Skin integrity    Rehab Potential Fair    Clinical Decision Making Multiple treatment options, significant modification of task necessary    Comorbidities Affecting Occupational Performance: Presence of comorbidities impacting occupational performance    Modification or Assistance to Complete Evaluation  Max significant modification of tasks or assist is necessary to complete    OT Frequency 1x / week    OT Duration 6 weeks   and PRN   OT Treatment/Interventions Self-care/ADL training;DME and/or AE instruction;Manual lymph drainage;Patient/family education;Compression bandaging;Therapeutic exercise;Manual Therapy;Other (comment)    Plan skin care to decrease infection risk and improve hydration    Recommended Other Services fit with adjustable , alternative compression wraps for optimal independence w/ donning and doffing. Cnsider CircAid with footpieces.    Consulted and Agree with Plan of Care Patient;Family member/caregiver           Patient will benefit from skilled therapeutic intervention in order to improve the following deficits and impairments:   Body Structure / Function / Physical Skills: ADL,Decreased knowledge of use of DME,Pain,Edema,Scar mobility,Decreased knowledge of precautions,Skin integrity        Visit Diagnosis: Lymphedema, not elsewhere classified    Problem List Patient Active Problem List   Diagnosis Date Noted  . Medication noncompliance due to cognitive impairment 10/17/2019  . Chronic pain of multiple joints 08/01/2017  . Age-related osteoporosis without current pathological fracture 08/01/2017  . Left knee pain 09/19/2016  .  Rectal benign neoplasm   . Asthma, chronic 04/04/2015  . history of Tubular adenoma of colon 04/04/2015  . CKD (chronic kidney disease) stage 3, GFR 30-59 ml/min (HCC) 04/03/2015  . Edema 04/14/2014  . Chronic systolic heart failure ([place]) 11/20/2013  . Lymphedema of lower extremity 10/17/2013  . 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 MGUS (monoclonal gammopathy of unknown significance) 05/15/2012  . Vitamin D deficiency 01/29/2010  . DERMATITIS 09/30/2009  . Paranoid delusion ([place]) 08/02/2008  . Obstructive chronic bronchitis ([place]) 08/02/2008  . Essential hypertension 11/11/2007  . Venous (peripheral) insufficiency 11/11/2007  . RENAL CALCULUS 11/11/2007  . LOW BACK PAIN, CHRONIC 11/11/2007    Andrey Spearman, MS, OTR/L, [facility] 02/14/21 4:23 PM  [place] MAIN [facility] SERVICES [address] [place], [place], [address] Phone: [phone]   Fax:  [phone]  Name: [name] MRN: [id number] Date of Birth: 1931/12/30

## 2021-02-21 ENCOUNTER — Ambulatory Visit: Payer: Medicare Other | Admitting: Occupational Therapy

## 2021-02-21 ENCOUNTER — Other Ambulatory Visit: Payer: Self-pay

## 2021-02-21 DIAGNOSIS — I890 Lymphedema, not elsewhere classified: Secondary | ICD-10-CM

## 2021-02-21 NOTE — Therapy (Signed)
[name], [name], [name] MRN: [id number] Date of Birth: [birth year] Referring Provider (OT): Sophia Hughs, NP   Encounter Date: 02/21/2021   OT End of Session - 02/21/21 1503    Visit Number 26    Number of Visits 36    Date for OT Re-Evaluation 03/01/21    Authorization Time Period 09/28/20 - 01/03/21    OT Start Time 0300    OT Stop Time 0400    OT Time Calculation (min) 60 min    Activity Tolerance Patient tolerated treatment well;No increased pain;Patient limited by pain    Behavior During Therapy [facility] for tasks assessed/performed           Past Medical History:  Diagnosis Date  . Anemia, unspecified   . Anxiety   . Arthritis   . Asthma   . Delusions ([place])   . Depression   . Edema 2005   "since put the glass in them"   . GERD (gastroesophageal reflux disease)   . Lumbago   . Lymphedema   . Obstructive chronic bronchitis without exacerbation ([place])   . Other abnormal glucose   . 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 Systolic heart failure (HCC)    EF 35 to 40% per echo January 2015  . Unspecified essential hypertension   . Unspecified nonpsychotic mental disorder   . Unspecified venous (peripheral) insufficiency   . Unspecified venous (peripheral) insufficiency   . Unspecified vitamin D deficiency     Past Surgical History:  Procedure Laterality Date  . APPENDECTOMY    . CERVICAL SPINE SURGERY     x 2  . CHOLECYSTECTOMY    . ESOPHAGOGASTRODUODENOSCOPY N/A 04/05/2015   Procedure: ESOPHAGOGASTRODUODENOSCOPY (EGD);  Surgeon: [name], MD;  Location: [facility];  Service: Endoscopy;  Laterality: N/A;  . FLEXIBLE SIGMOIDOSCOPY N/A 04/05/2015   Procedure: FLEXIBLE SIGMOIDOSCOPY;  Surgeon: [name], MD;  Location: [facility];  Service: Endoscopy;   Laterality: N/A;  . HAND SURGERY    . LUMBAR DISC SURGERY    . PARTIAL HYSTERECTOMY    . TONSILLECTOMY      There were no vitals filed for this visit.   Subjective Assessment - 02/21/21 1626    Subjective  [name] presents for OT Rx visit 26/36 to address BLE lymphedma. Pt is accompanied by her nephew, [name]. Pt does not rate leg pain numerically today. [name] reports they may have to miss next week's visit due to work obligation.    Patient is accompanied by: Family member    Pertinent History Chronic obstructive bronchitis, CVI, anxiety , systoloc heart failure, asthma, arthritis, multiple spinal surgeries w/ chronic back pain, DM,    Limitations Difficulty walking, impaired standing tolerance, impaired transfers, decreased joint AROM at toes, ankles and knees, chronic leg swelling , chronic pain. increased infection and falls risks, cognitive impairment, generaized weakness    Repetition Increases Symptoms    Special Tests + Stemmer bilaterally    Patient Stated Goals reduce swelling and legs feel better    Pain Onset Other (comment)   >20 years                       OT Treatments/Exercises (OP) - 02/21/21 1508      ADLs   ADL Education Given Yes  Manual Therapy   Manual Therapy Edema management;Manual Lymphatic Drainage (MLD);Compression Bandaging    Manual Lymphatic Drainage (MLD) MLD to LLE as established. Pants worn to clinic severly limit access to inguinal LN      for proximal MLD.    Compression Bandaging Multilayer, gradient compression wraps applied to LLE using 1 each of short stretch bandage 8, 10 and 12 cm wide over single layer of fRosidal oam and soft cotton stockinett. from base of toes to tibial tuberosity. CircAid adjustable knee length garment alternative applied to RLE using  ~ 30-40 mmHg.    Other Manual Therapy skin care to LLE with low ph castor oil concurrent w MLD                  OT Education - 02/21/21 1628    Education  provided Yes    Education Details Continued Pt/ CG edu for lymphedema self care  and home program throughout session. Topics include multilayer, gradient compression wrapping, simple self-MLD, therapeutic lymphatic pumping exercises, skin/nail care, risk reduction factors and LE precautions, compression garments/recommendations and wear and care schedule and compression garment donning / doffing using assistive devices. All questions answered to the Pt's satisfaction, and Pt demonstrates understanding by report.    Person(s) Educated Patient;Caregiver(s)    Methods Explanation;Demonstration    Comprehension Verbalized understanding;Returned demonstration;Need further instruction               OT Long Term Goals - 01/04/21 0955      OT LONG TERM GOAL #1   Title With maximum caregiver assistance Pt will be able to apply knee length, multi-layer, short stretch compression wraps daily from ankle to tibial tuberosity on one leg at a time using correct gradient techniques to return affected limb/s, as closely as possible, to premorbid size and shape, to limit leg pain and infection risk, and to improve safe functional mobility and ADLs performance. 01/03/21: Manufacturer's rep coming next week to assist with fitting adjustable, Velcro style , knee length compression leg wraps. Hopefiullly Pt will be able to manage these more independently, and may be more do-able for caregivers. Drawback is they do not manage foot swelling well.    Baseline dependent    Time 4    Period Days    Status Not Met   DC goal.   Target Date --   DC goal     OT LONG TERM GOAL #2   Title With Maximum caregiver assistance Pt will be able to verbalize signs and symptoms of cellulitis infection and identify lymphedema precautions using printed resource (modified independence) for reference to decrease infection risk and limit LE progression over time.    Baseline dependent    Time 6    Period Days    Status Achieved       OT LONG TERM GOAL #3   Title With Maximum caregiver assistance Pt >85 % compliant with all daily, LE self-care protocols for home program, including simple self-manual lymphatic drainage (MLD), skin care, lymphatic pumping ther ex, skin care, and compression wraps and garments  to limit LE progression and further functional decline.    Baseline dependent    Time 12    Period Weeks    Status Not Met   DC goal. Pt lives alone and has no consistent CG assistance with home program components other than skin care.     OT LONG TERM GOAL #4   Baseline dependent    Time 12    Period  Weeks    Status Not Met      OT LONG TERM GOAL #5   Title Pt to tolerate compression wraps/ garments in keeping w/ prescribed daily wear regime within 1 week of issue date to progress and retain clinical and functional gains and to limit LE progression.    Baseline dependent    Time 12    Period Weeks    Status Achieved   met for LLE     OT LONG TERM GOAL #6   Title Pt to achieve at least 10% LLE limb volume reduction bilaterally during modified Intensive Phase CDT to reduce infection risk, risk of recurrent leg wounds, to improve functional performance of basic and instrumental ADLs, and to limit LE progression.    Baseline dependent    Time 12    Period Weeks    Status Partially Met   09/28/20 LLE belwo the knee (A-D) decreased by 8.4%. RLE not yet treated.                Plan - 02/21/21 1623    Clinical Impression Statement .LLE significantly more swollen today than last week. Pt tells me she has not had compression in place for several days and tends not to elevate legs when seated watching TV.  Skin condition is slightly improved in terms of hydration, but large flakes slough off during MLD. Pt tolerated manual therapy and reapplication of wraps to LLE and CircAid to RLE without increased pain . Cont as per POC.    OT Occupational Profile and History Comprehensive Assessment- Review of records and  extensive additional review of physical, cognitive, psychosocial history related to current functional performance    Occupational Profile and client history currently impacting functional performance dimensia, inconsistent CG assistance with removing wraps, providing skin care and reapplying compression between clinical visits. S    Occupational performance deficits (Please refer to evaluation for details): ADL's;IADL's;Rest and Sleep;Play;Leisure;Social Participation;Other   body image   Body Structure / Function / Physical Skills ADL;Decreased knowledge of use of DME;Pain;Edema;Scar mobility;Decreased knowledge of precautions;Skin integrity    Rehab Potential Fair    Clinical Decision Making Multiple treatment options, significant modification of task necessary    Comorbidities Affecting Occupational Performance: Presence of comorbidities impacting occupational performance    Modification or Assistance to Complete Evaluation  Max significant modification of tasks or assist is necessary to complete    OT Frequency 1x / week    OT Duration 6 weeks   and PRN   OT Treatment/Interventions Self-care/ADL training;DME and/or AE instruction;Manual lymph drainage;Patient/family education;Compression bandaging;Therapeutic exercise;Manual Therapy;Other (comment)    Plan skin care to decrease infection risk and improve hydration    Recommended Other Services fit with adjustable , alternative compression wraps for optimal independence w/ donning and doffing. Cnsider CircAid with footpieces.    Consulted and Agree with Plan of Care Patient;Family member/caregiver           Patient will benefit from skilled therapeutic intervention in order to improve the following deficits and impairments:   Body Structure / Function / Physical Skills: ADL,Decreased knowledge of use of DME,Pain,Edema,Scar mobility,Decreased knowledge of precautions,Skin integrity       Visit Diagnosis: Lymphedema, not elsewhere  classified    Problem List Patient Active Problem List   Diagnosis Date Noted  . Medication noncompliance due to cognitive impairment 10/17/2019  . Chronic pain of multiple joints 08/01/2017  . Age-related osteoporosis without current pathological fracture 08/01/2017  . Left knee pain 09/19/2016  .  Rectal benign neoplasm   . Asthma, chronic 04/04/2015  . history of Tubular adenoma of colon 04/04/2015  . CKD (chronic kidney disease) stage 3, GFR 30-59 ml/min (HCC) 04/03/2015  . Edema 04/14/2014  . Chronic systolic heart failure ([place]) 11/20/2013  . Lymphedema of lower extremity 10/17/2013  . 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 MGUS (monoclonal gammopathy of unknown significance) 05/15/2012  . Vitamin D deficiency 01/29/2010  . DERMATITIS 09/30/2009  . Paranoid delusion ([place]) 08/02/2008  . Obstructive chronic bronchitis ([place]) 08/02/2008  . Essential hypertension 11/11/2007  . Venous (peripheral) insufficiency 11/11/2007  . RENAL CALCULUS 11/11/2007  . LOW BACK PAIN, CHRONIC 11/11/2007    Andrey Spearman, MS, OTR/L, [facility] 02/21/21 4:29 PM  [place] MAIN [facility] SERVICES [address] [place], [name], [address] Phone: [phone]   Fax:  [phone]  Name: KIRSTON LUTY MRN: [id number] Date of Birth: 1931/11/29

## 2021-02-28 ENCOUNTER — Encounter: Payer: Medicare Other | Admitting: Occupational Therapy

## 2021-02-28 ENCOUNTER — Ambulatory Visit: Payer: Medicare Other | Admitting: Occupational Therapy

## 2021-03-07 ENCOUNTER — Encounter: Payer: Medicare Other | Admitting: Occupational Therapy

## 2021-03-08 ENCOUNTER — Ambulatory Visit: Payer: Medicare Other | Admitting: Occupational Therapy

## 2021-03-14 ENCOUNTER — Other Ambulatory Visit: Payer: Self-pay | Admitting: Nurse Practitioner

## 2021-03-14 ENCOUNTER — Encounter: Payer: Medicare Other | Admitting: Occupational Therapy

## 2021-03-14 DIAGNOSIS — I5022 Chronic systolic (congestive) heart failure: Secondary | ICD-10-CM

## 2021-03-14 DIAGNOSIS — I890 Lymphedema, not elsewhere classified: Secondary | ICD-10-CM

## 2021-03-15 ENCOUNTER — Ambulatory Visit: Payer: Medicare Other | Attending: Family | Admitting: Occupational Therapy

## 2021-03-15 ENCOUNTER — Telehealth: Payer: Self-pay

## 2021-03-15 ENCOUNTER — Other Ambulatory Visit: Payer: Self-pay

## 2021-03-15 DIAGNOSIS — I890 Lymphedema, not elsewhere classified: Secondary | ICD-10-CM | POA: Diagnosis not present

## 2021-03-15 NOTE — Telephone Encounter (Signed)
Message left on clinical intake voicemail by patients nephew stating he received a call from the pharmacy that we denied refill request for potassium and he would like to know why.   I called the pharmacy and a recording stating they were out to lunch, I opted to call the pharmacy and left a message stating we have a receipt confirmation that rx approval was received on 03/14/2021. I asked that if this is an error and rx was not received for the pharmacy to return the call.   I returned call to [place] him rx was approved and I was not sure why he was told otherwise. I also informed Zenia Resides that I have left a message for the pharmacist.

## 2021-03-17 NOTE — Therapy (Signed)
[place] MAIN [facility] SERVICES [address] [place], [place], [address] Phone: [phone]   Fax:  [phone]  Occupational Therapy Treatment  Patient Details  Name: [name] MRN: [id number] Date of Birth: 06-30-1932 Referring Provider (OT): Sandrea Hughs, NP   Encounter Date: 03/15/2021   OT End of Session - 03/17/21 1220    Visit Number 27    Number of Visits 36    Date for OT Re-Evaluation 06/13/21    Authorization Time Period 09/28/20 - 01/03/21    Activity Tolerance Patient tolerated treatment well;No increased pain;Patient limited by pain    Behavior During Therapy [facility] for tasks assessed/performed           Past Medical History:  Diagnosis Date  . Anemia, unspecified   . Anxiety   . Arthritis   . Asthma   . Delusions ([place])   . Depression   . Edema 2005   "since put the glass in them"   . GERD (gastroesophageal reflux disease)   . Lumbago   . Lymphedema   . Obstructive chronic bronchitis without exacerbation ([place])   . Other abnormal glucose   . 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[place]) 04/04/2013  . Systolic heart failure (HCC)    EF 35 to 40% per echo January 2015  . Unspecified essential hypertension   . Unspecified nonpsychotic mental disorder   . Unspecified venous (peripheral) insufficiency   . Unspecified venous (peripheral) insufficiency   . Unspecified vitamin D deficiency     Past Surgical History:  Procedure Laterality Date  . APPENDECTOMY    . CERVICAL SPINE SURGERY     x 2  . CHOLECYSTECTOMY    . ESOPHAGOGASTRODUODENOSCOPY N/A 04/05/2015   Procedure: ESOPHAGOGASTRODUODENOSCOPY (EGD);  Surgeon: [name], MD;  Location: [facility];  Service: Endoscopy;  Laterality: N/A;  . FLEXIBLE SIGMOIDOSCOPY N/A 04/05/2015   Procedure: FLEXIBLE SIGMOIDOSCOPY;  Surgeon: [name], MD;  Location: [facility];  Service: Endoscopy;  Laterality: N/A;  . HAND SURGERY    . LUMBAR DISC SURGERY    . PARTIAL HYSTERECTOMY     . TONSILLECTOMY      There were no vitals filed for this visit.   Subjective Assessment - 03/17/21 1216    Subjective  [name] presents for OT Rx visit 26/36 to address BLE lymphedma. Pt is accompanied by her nephew, [name]. Pt missed last scheduled OT visit due to illness. She reports pain in BLE is unchanged, but does not rate numerically today.    Pertinent History Chronic obstructive bronchitis, CVI, anxiety , systoloc heart failure, asthma, arthritis, multiple spinal surgeries w/ chronic back pain, DM,    Limitations Difficulty walking, impaired standing tolerance, impaired transfers, decreased joint AROM at toes, ankles and knees, chronic leg swelling , chronic pain. increased infection and falls risks, cognitive impairment, generaized weakness    Repetition Increases Symptoms    Special Tests + Stemmer bilaterally    Patient Stated Goals reduce swelling and legs feel better    Currently in Pain? Yes    Pain Onset Other (comment)    Pain Frequency Intermittent    Pain Relieving Factors elevation, compression, MLD                        OT Treatments/Exercises (OP) - 03/17/21 1217      ADLs   ADL Education Given Yes      Manual Therapy   Manual Therapy Edema management;Manual Lymphatic  Drainage (MLD);Compression Bandaging    Manual Lymphatic Drainage (MLD) MLD to LLE as established. Pants worn to clinic severly limit access to inguinal LN      for proximal MLD.    Compression Bandaging Multilayer, gradient compression wraps applied to LLE using 1 each of short stretch bandage 8, 10 and 12 cm wide over single layer of fRosidal oam and soft cotton stockinett. from base of toes to tibial tuberosity. CircAid adjustable knee length garment alternative applied to RLE using  ~ 30-40 mmHg.    Other Manual Therapy skin care to BLE with low ph castor oil concurrent w MLD                  OT Education - 03/17/21 1218    Education provided Yes    Education  Details Continued Pt/ CG edu for lymphedema self care  and home program throughout session. Topics include multilayer, gradient compression wrapping, simple self-MLD, therapeutic lymphatic pumping exercises, skin/nail care, risk reduction factors and LE precautions, compression garments/recommendations and wear and care schedule and compression garment donning / doffing using assistive devices. All questions answered to the Pt's satisfaction, and Pt demonstrates understanding by report.    Person(s) Educated Patient;Caregiver(s)    Methods Explanation;Demonstration    Comprehension Verbalized understanding;Returned demonstration;Need further instruction               OT Long Term Goals - 01/04/21 0955      OT LONG TERM GOAL #1   Title With maximum caregiver assistance Pt will be able to apply knee length, multi-layer, short stretch compression wraps daily from ankle to tibial tuberosity on one leg at a time using correct gradient techniques to return affected limb/s, as closely as possible, to premorbid size and shape, to limit leg pain and infection risk, and to improve safe functional mobility and ADLs performance. 01/03/21: Manufacturer's rep coming next week to assist with fitting adjustable, Velcro style , knee length compression leg wraps. Hopefiullly Pt will be able to manage these more independently, and may be more do-able for caregivers. Drawback is they do not manage foot swelling well.    Baseline dependent    Time 4    Period Days    Status Not Met   DC goal.   Target Date --   DC goal     OT LONG TERM GOAL #2   Title With Maximum caregiver assistance Pt will be able to verbalize signs and symptoms of cellulitis infection and identify lymphedema precautions using printed resource (modified independence) for reference to decrease infection risk and limit LE progression over time.    Baseline dependent    Time 6    Period Days    Status Achieved      OT LONG TERM GOAL #3    Title With Maximum caregiver assistance Pt >85 % compliant with all daily, LE self-care protocols for home program, including simple self-manual lymphatic drainage (MLD), skin care, lymphatic pumping ther ex, skin care, and compression wraps and garments  to limit LE progression and further functional decline.    Baseline dependent    Time 12    Period Weeks    Status Not Met   DC goal. Pt lives alone and has no consistent CG assistance with home program components other than skin care.     OT LONG TERM GOAL #4   Baseline dependent    Time 12    Period Weeks    Status Not Met  OT LONG TERM GOAL #5   Title Pt to tolerate compression wraps/ garments in keeping w/ prescribed daily wear regime within 1 week of issue date to progress and retain clinical and functional gains and to limit LE progression.    Baseline dependent    Time 12    Period Weeks    Status Achieved   met for LLE     OT LONG TERM GOAL #6   Title Pt to achieve at least 10% LLE limb volume reduction bilaterally during modified Intensive Phase CDT to reduce infection risk, risk of recurrent leg wounds, to improve functional performance of basic and instrumental ADLs, and to limit LE progression.    Baseline dependent    Time 12    Period Weeks    Status Partially Met   09/28/20 LLE belwo the knee (A-D) decreased by 8.4%. RLE not yet treated.                Plan - 03/17/21 1212    Clinical Impression Statement BLE present with excessive dryness and flaking skin today. RLE swelling is under pretty good control , considering Pt missed last session due to illness. LLE swelling is typically dense and hard in foot, but leg presents with more skin wrinkles than usual and less congestion than anticipated. Empahsis of session on MLD , skin care and compression  to LLE. RLE moisterized with castor oil and then provided Max A to don velcro style, adjustable compression legging. Manmnual therapy to RLE, including MLD, skin  care and gradient compression wrapping from toes to popliteal fossa is well tolerated without pain. Cont as per POC.    OT Occupational Profile and History Comprehensive Assessment- Review of records and extensive additional review of physical, cognitive, psychosocial history related to current functional performance    Occupational Profile and client history currently impacting functional performance dimensia, inconsistent CG assistance with removing wraps, providing skin care and reapplying compression between clinical visits. S    Occupational performance deficits (Please refer to evaluation for details): ADL's;IADL's;Rest and Sleep;Play;Leisure;Social Participation;Other   body image   Body Structure / Function / Physical Skills ADL;Decreased knowledge of use of DME;Pain;Edema;Scar mobility;Decreased knowledge of precautions;Skin integrity    Rehab Potential Fair    Clinical Decision Making Multiple treatment options, significant modification of task necessary    Comorbidities Affecting Occupational Performance: Presence of comorbidities impacting occupational performance    Modification or Assistance to Complete Evaluation  Max significant modification of tasks or assist is necessary to complete    OT Frequency 1x / week    OT Duration 6 weeks   and PRN   OT Treatment/Interventions Self-care/ADL training;DME and/or AE instruction;Manual lymph drainage;Patient/family education;Compression bandaging;Therapeutic exercise;Manual Therapy;Other (comment)    Plan skin care to decrease infection risk and improve hydration    Recommended Other Services fit with adjustable , alternative compression wraps for optimal independence w/ donning and doffing. Cnsider CircAid with footpieces.    Consulted and Agree with Plan of Care Patient;Family member/caregiver           Patient will benefit from skilled therapeutic intervention in order to improve the following deficits and impairments:   Body Structure  / Function / Physical Skills: ADL,Decreased knowledge of use of DME,Pain,Edema,Scar mobility,Decreased knowledge of precautions,Skin integrity       Visit Diagnosis: Lymphedema, not elsewhere classified    Problem List Patient Active Problem List   Diagnosis Date Noted  . Medication noncompliance due to cognitive impairment 10/17/2019  .  Chronic pain of multiple joints 08/01/2017  . Age-related osteoporosis without current pathological fracture 08/01/2017  . Left knee pain 09/19/2016  . Rectal benign neoplasm   . Asthma, chronic 04/04/2015  . history of Tubular adenoma of colon 04/04/2015  . CKD (chronic kidney disease) stage 3, GFR 30-59 ml/min (HCC) 04/03/2015  . Edema 04/14/2014  . Chronic systolic heart failure ([place]) 11/20/2013  . Lymphedema of lower extremity 10/17/2013  . 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 MGUS (monoclonal gammopathy of unknown significance) 05/15/2012  . Vitamin D deficiency 01/29/2010  . DERMATITIS 09/30/2009  . Paranoid delusion ([place]) 08/02/2008  . Obstructive chronic bronchitis ([place]) 08/02/2008  . Essential hypertension 11/11/2007  . Venous (peripheral) insufficiency 11/11/2007  . RENAL CALCULUS 11/11/2007  . LOW BACK PAIN, CHRONIC 11/11/2007   Andrey Spearman, MS, OTR/L, [facility] 03/17/21 12:21 PM   [place] MAIN [facility] SERVICES [address] [place], [place], [address] Phone: [phone]   Fax:  [phone]  Name: [name] MRN: [id number] Date of Birth: Nov 22, 1931

## 2021-03-21 ENCOUNTER — Encounter: Payer: Medicare Other | Admitting: Occupational Therapy

## 2021-03-22 ENCOUNTER — Other Ambulatory Visit: Payer: Self-pay

## 2021-03-22 ENCOUNTER — Ambulatory Visit: Payer: Medicare Other | Admitting: Occupational Therapy

## 2021-03-22 DIAGNOSIS — I890 Lymphedema, not elsewhere classified: Secondary | ICD-10-CM | POA: Diagnosis not present

## 2021-03-22 NOTE — Therapy (Signed)
[place] MAIN [facility] SERVICES [address] [place], [place], [address] Phone: [phone]   Fax:  [phone]  Occupational Therapy Treatment  Patient Details  Name: [name] MRN: [id number] Date of Birth: 1932/10/28 Referring Provider (OT): Sandrea Hughs, NP   Encounter Date: 03/22/2021   OT End of Session - 03/22/21 1511    Visit Number 28    Number of Visits 36    Date for OT Re-Evaluation 06/13/21    Authorization Time Period 09/28/20 - 01/03/21    OT Start Time 0305    OT Stop Time 0408    OT Time Calculation (min) 63 min    Activity Tolerance Patient tolerated treatment well;No increased pain;Patient limited by pain    Behavior During Therapy [facility] for tasks assessed/performed           Past Medical History:  Diagnosis Date  . Anemia, unspecified   . Anxiety   . Arthritis   . Asthma   . Delusions ([place])   . Depression   . Edema 2005   "since put the glass in them"   . GERD (gastroesophageal reflux disease)   . Lumbago   . Lymphedema   . Obstructive chronic bronchitis without exacerbation ([place])   . Other abnormal glucose   . 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 Systolic heart failure (HCC)    EF 35 to 40% per echo January 2015  . Unspecified essential hypertension   . Unspecified nonpsychotic mental disorder   . Unspecified venous (peripheral) insufficiency   . Unspecified venous (peripheral) insufficiency   . Unspecified vitamin D deficiency     Past Surgical History:  Procedure Laterality Date  . APPENDECTOMY    . CERVICAL SPINE SURGERY     x 2  . CHOLECYSTECTOMY    . ESOPHAGOGASTRODUODENOSCOPY N/A 04/05/2015   Procedure: ESOPHAGOGASTRODUODENOSCOPY (EGD);  Surgeon: [name], MD;  Location: [facility];  Service: Endoscopy;  Laterality: N/A;  . FLEXIBLE SIGMOIDOSCOPY N/A 04/05/2015   Procedure: FLEXIBLE SIGMOIDOSCOPY;  Surgeon: [name], MD;  Location: [facility];  Service: Endoscopy;   Laterality: N/A;  . HAND SURGERY    . LUMBAR DISC SURGERY    . PARTIAL HYSTERECTOMY    . TONSILLECTOMY      There were no vitals filed for this visit.   Subjective Assessment - 03/22/21 1611    Subjective  [name] presents for OT Rx visit 28/36 to address BLE lymphedma. Pt is accompanied by her nephew, [name]. She reports pain in BLE is unchanged, but does not rate numerically today.    Pertinent History Chronic obstructive bronchitis, CVI, anxiety , systoloc heart failure, asthma, arthritis, multiple spinal surgeries w/ chronic back pain, DM,    Limitations Difficulty walking, impaired standing tolerance, impaired transfers, decreased joint AROM at toes, ankles and knees, chronic leg swelling , chronic pain. increased infection and falls risks, cognitive impairment, generaized weakness    Repetition Increases Symptoms    Special Tests + Stemmer bilaterally    Patient Stated Goals reduce swelling and legs feel better    Pain Onset Other (comment)                        OT Treatments/Exercises (OP) - 03/22/21 1612      ADLs   ADL Education Given Yes      Manual Therapy   Manual Therapy Edema management;Manual Lymphatic Drainage (MLD);Compression Bandaging    Manual Lymphatic  Drainage (MLD) MLD to LLE as established. Pants worn to clinic severly limit access to inguinal LN      for proximal MLD.    Compression Bandaging Multilayer, gradient compression wraps applied to LLE using 1 each of short stretch bandage 8, 10 and 12 cm wide over single layer of fRosidal oam and soft cotton stockinett. from base of toes to tibial tuberosity. CircAid adjustable knee length garment alternative applied to RLE using  ~ 30-40 mmHg.    Other Manual Therapy skin care to BLE with low ph castor oil concurrent w MLD                  OT Education - 03/22/21 1612    Education provided Yes    Education Details Continued Pt/ CG edu for lymphedema self care  and home program  throughout session. Topics include multilayer, gradient compression wrapping, simple self-MLD, therapeutic lymphatic pumping exercises, skin/nail care, risk reduction factors and LE precautions, compression garments/recommendations and wear and care schedule and compression garment donning / doffing using assistive devices. All questions answered to the Pt's satisfaction, and Pt demonstrates understanding by report.    Person(s) Educated Patient;Caregiver(s)    Methods Explanation;Demonstration    Comprehension Verbalized understanding;Returned demonstration;Need further instruction               OT Long Term Goals - 01/04/21 0955      OT LONG TERM GOAL #1   Title With maximum caregiver assistance Pt will be able to apply knee length, multi-layer, short stretch compression wraps daily from ankle to tibial tuberosity on one leg at a time using correct gradient techniques to return affected limb/s, as closely as possible, to premorbid size and shape, to limit leg pain and infection risk, and to improve safe functional mobility and ADLs performance. 01/03/21: Manufacturer's rep coming next week to assist with fitting adjustable, Velcro style , knee length compression leg wraps. Hopefiullly Pt will be able to manage these more independently, and may be more do-able for caregivers. Drawback is they do not manage foot swelling well.    Baseline dependent    Time 4    Period Days    Status Not Met   DC goal.   Target Date --   DC goal     OT LONG TERM GOAL #2   Title With Maximum caregiver assistance Pt will be able to verbalize signs and symptoms of cellulitis infection and identify lymphedema precautions using printed resource (modified independence) for reference to decrease infection risk and limit LE progression over time.    Baseline dependent    Time 6    Period Days    Status Achieved      OT LONG TERM GOAL #3   Title With Maximum caregiver assistance Pt >85 % compliant with all daily,  LE self-care protocols for home program, including simple self-manual lymphatic drainage (MLD), skin care, lymphatic pumping ther ex, skin care, and compression wraps and garments  to limit LE progression and further functional decline.    Baseline dependent    Time 12    Period Weeks    Status Not Met   DC goal. Pt lives alone and has no consistent CG assistance with home program components other than skin care.     OT LONG TERM GOAL #4   Baseline dependent    Time 12    Period Weeks    Status Not Met      OT LONG TERM GOAL #5  Title Pt to tolerate compression wraps/ garments in keeping w/ prescribed daily wear regime within 1 week of issue date to progress and retain clinical and functional gains and to limit LE progression.    Baseline dependent    Time 12    Period Weeks    Status Achieved   met for LLE     OT LONG TERM GOAL #6   Title Pt to achieve at least 10% LLE limb volume reduction bilaterally during modified Intensive Phase CDT to reduce infection risk, risk of recurrent leg wounds, to improve functional performance of basic and instrumental ADLs, and to limit LE progression.    Baseline dependent    Time 12    Period Weeks    Status Partially Met   09/28/20 LLE belwo the knee (A-D) decreased by 8.4%. RLE not yet treated.                Plan - 03/22/21 1608    Clinical Impression Statement Skin is very dry and flaking below the knees again today. No signs of infection. Pt c/o tenderness at medial and lateral maleoli with fibrosis techniques. L ankle AROM is moderately limited in dorsiflexion and mildly limited in plantarflexion due to skin tightness and fibrosis. [name] tolerated MLD, skin care and compression wrapping today without increased pain. She continues to use CircAids with family caregiver's assistance between visits more frequently by report.. Cont OT 1 x weekly as per POC to assist with ongoing self management.    OT Occupational Profile and History  Comprehensive Assessment- Review of records and extensive additional review of physical, cognitive, psychosocial history related to current functional performance    Occupational Profile and client history currently impacting functional performance dimensia, inconsistent CG assistance with removing wraps, providing skin care and reapplying compression between clinical visits. S    Occupational performance deficits (Please refer to evaluation for details): ADL's;IADL's;Rest and Sleep;Play;Leisure;Social Participation;Other   body image   Body Structure / Function / Physical Skills ADL;Decreased knowledge of use of DME;Pain;Edema;Scar mobility;Decreased knowledge of precautions;Skin integrity    Rehab Potential Fair    Clinical Decision Making Multiple treatment options, significant modification of task necessary    Comorbidities Affecting Occupational Performance: Presence of comorbidities impacting occupational performance    Modification or Assistance to Complete Evaluation  Max significant modification of tasks or assist is necessary to complete    OT Frequency 1x / week    OT Duration 6 weeks   and PRN   OT Treatment/Interventions Self-care/ADL training;DME and/or AE instruction;Manual lymph drainage;Patient/family education;Compression bandaging;Therapeutic exercise;Manual Therapy;Other (comment)    Plan skin care to decrease infection risk and improve hydration    Recommended Other Services fit with adjustable , alternative compression wraps for optimal independence w/ donning and doffing. Cnsider CircAid with footpieces.    Consulted and Agree with Plan of Care Patient;Family member/caregiver           Patient will benefit from skilled therapeutic intervention in order to improve the following deficits and impairments:   Body Structure / Function / Physical Skills: ADL,Decreased knowledge of use of DME,Pain,Edema,Scar mobility,Decreased knowledge of precautions,Skin integrity        Visit Diagnosis: Lymphedema, not elsewhere classified    Problem List Patient Active Problem List   Diagnosis Date Noted  . Medication noncompliance due to cognitive impairment 10/17/2019  . Chronic pain of multiple joints 08/01/2017  . Age-related osteoporosis without current pathological fracture 08/01/2017  . Left knee pain 09/19/2016  . Rectal  benign neoplasm   . Asthma, chronic 04/04/2015  . history of Tubular adenoma of colon 04/04/2015  . CKD (chronic kidney disease) stage 3, GFR 30-59 ml/min (HCC) 04/03/2015  . Edema 04/14/2014  . Chronic systolic heart failure ([place]) 11/20/2013  . Lymphedema of lower extremity 10/17/2013  . 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 MGUS (monoclonal gammopathy of unknown significance) 05/15/2012  . Vitamin D deficiency 01/29/2010  . DERMATITIS 09/30/2009  . Paranoid delusion ([place]) 08/02/2008  . Obstructive chronic bronchitis ([place]) 08/02/2008  . Essential hypertension 11/11/2007  . Venous (peripheral) insufficiency 11/11/2007  . RENAL CALCULUS 11/11/2007  . LOW BACK PAIN, CHRONIC 11/11/2007    Andrey Spearman, MS, OTR/L, [facility] 03/22/21 4:13 PM  [place] MAIN [facility] SERVICES [address] [place], [place], [address] Phone: [phone]   Fax:  [phone]  Name: [name] MRN: [id number] Date of Birth: [birth year]

## 2021-03-29 ENCOUNTER — Ambulatory Visit: Payer: Medicare Other | Admitting: Occupational Therapy

## 2021-03-29 ENCOUNTER — Other Ambulatory Visit: Payer: Self-pay

## 2021-03-29 DIAGNOSIS — I890 Lymphedema, not elsewhere classified: Secondary | ICD-10-CM | POA: Diagnosis not present

## 2021-03-29 NOTE — Therapy (Signed)
[place] MAIN [facility] SERVICES [address] [place], [place], [address] Phone: [phone]   Fax:  [phone]  Occupational Therapy Treatment  Patient Details  Name: [name] MRN: [id number] Date of Birth: 1932/08/26 Referring Provider (OT): Sandrea Hughs, NP   Encounter Date: 03/29/2021   OT End of Session - 03/29/21 1607    Visit Number 29    Number of Visits 36    Date for OT Re-Evaluation 06/13/21    Authorization Time Period 09/28/20 - 01/03/21    OT Start Time 0309    OT Stop Time 0405    OT Time Calculation (min) 56 min    Activity Tolerance Patient tolerated treatment well;No increased pain;Patient limited by pain    Behavior During Therapy [facility] for tasks assessed/performed           Past Medical History:  Diagnosis Date  . Anemia, unspecified   . Anxiety   . Arthritis   . Asthma   . Delusions ([place])   . Depression   . Edema 2005   "since put the glass in them"   . GERD (gastroesophageal reflux disease)   . Lumbago   . Lymphedema   . Obstructive chronic bronchitis without exacerbation ([place])   . Other abnormal glucose   . 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[place]) 04/04/2013  . Systolic heart failure (HCC)    EF 35 to 40% per echo January 2015  . Unspecified essential hypertension   . Unspecified nonpsychotic mental disorder   . Unspecified venous (peripheral) insufficiency   . Unspecified venous (peripheral) insufficiency   . Unspecified vitamin D deficiency     Past Surgical History:  Procedure Laterality Date  . APPENDECTOMY    . CERVICAL SPINE SURGERY     x 2  . CHOLECYSTECTOMY    . ESOPHAGOGASTRODUODENOSCOPY N/A 04/05/2015   Procedure: ESOPHAGOGASTRODUODENOSCOPY (EGD);  Surgeon: [name], MD;  Location: [facility];  Service: Endoscopy;  Laterality: N/A;  . FLEXIBLE SIGMOIDOSCOPY N/A 04/05/2015   Procedure: FLEXIBLE SIGMOIDOSCOPY;  Surgeon: [name], MD;  Location: [facility];  Service: Endoscopy;   Laterality: N/A;  . HAND SURGERY    . LUMBAR DISC SURGERY    . PARTIAL HYSTERECTOMY    . TONSILLECTOMY      There were no vitals filed for this visit.   Subjective Assessment - 03/29/21 1613    Subjective  [name] presents for OT Rx visit 29/36 to address BLE lymphedma. Pt is accompanied by her nephew, [name]. She denies LE related leg paoin today. Ptand CG request reduced OT frequency for month f June due to work demands.Team discussed and finalized plan throughout session.    Pertinent History Chronic obstructive bronchitis, CVI, anxiety , systoloc heart failure, asthma, arthritis, multiple spinal surgeries w/ chronic back pain, DM,    Limitations Difficulty walking, impaired standing tolerance, impaired transfers, decreased joint AROM at toes, ankles and knees, chronic leg swelling , chronic pain. increased infection and falls risks, cognitive impairment, generaized weakness    Repetition Increases Symptoms    Special Tests + Stemmer bilaterally    Patient Stated Goals reduce swelling and legs feel better    Pain Onset Other (comment)                        OT Treatments/Exercises (OP) - 03/29/21 1614      ADLs   ADL Education Given Yes      Manual Therapy  Manual Therapy Edema management;Manual Lymphatic Drainage (MLD);Compression Bandaging    Manual Lymphatic Drainage (MLD) MLD to LLE as established. Pants worn to clinic severly limit access to inguinal LN      for proximal MLD.    Compression Bandaging Multilayer, gradient compression wraps applied to LLE using 1 each of short stretch bandage 8, 10 and 12 cm wide over single layer of fRosidal oam and soft cotton stockinett. from base of toes to tibial tuberosity. CircAid adjustable knee length garment alternative applied to RLE using  ~ 30-40 mmHg.    Other Manual Therapy skin care to BLE with low ph castor oil concurrent w MLD                  OT Education - 03/29/21 1614    Education provided  Yes    Education Details Continued Pt/ CG edu for lymphedema self care  and home program throughout session. Topics include multilayer, gradient compression wrapping, simple self-MLD, therapeutic lymphatic pumping exercises, skin/nail care, risk reduction factors and LE precautions, compression garments/recommendations and wear and care schedule and compression garment donning / doffing using assistive devices. All questions answered to the Pt's satisfaction, and Pt demonstrates understanding by report.    Person(s) Educated Patient;Caregiver(s)    Methods Explanation;Demonstration    Comprehension Verbalized understanding;Returned demonstration;Need further instruction               OT Long Term Goals - 01/04/21 0955      OT LONG TERM GOAL #1   Title With maximum caregiver assistance Pt will be able to apply knee length, multi-layer, short stretch compression wraps daily from ankle to tibial tuberosity on one leg at a time using correct gradient techniques to return affected limb/s, as closely as possible, to premorbid size and shape, to limit leg pain and infection risk, and to improve safe functional mobility and ADLs performance. 01/03/21: Manufacturer's rep coming next week to assist with fitting adjustable, Velcro style , knee length compression leg wraps. Hopefiullly Pt will be able to manage these more independently, and may be more do-able for caregivers. Drawback is they do not manage foot swelling well.    Baseline dependent    Time 4    Period Days    Status Not Met   DC goal.   Target Date --   DC goal     OT LONG TERM GOAL #2   Title With Maximum caregiver assistance Pt will be able to verbalize signs and symptoms of cellulitis infection and identify lymphedema precautions using printed resource (modified independence) for reference to decrease infection risk and limit LE progression over time.    Baseline dependent    Time 6    Period Days    Status Achieved      OT LONG  TERM GOAL #3   Title With Maximum caregiver assistance Pt >85 % compliant with all daily, LE self-care protocols for home program, including simple self-manual lymphatic drainage (MLD), skin care, lymphatic pumping ther ex, skin care, and compression wraps and garments  to limit LE progression and further functional decline.    Baseline dependent    Time 12    Period Weeks    Status Not Met   DC goal. Pt lives alone and has no consistent CG assistance with home program components other than skin care.     OT LONG TERM GOAL #4   Baseline dependent    Time 12    Period Weeks  Status Not Met      OT LONG TERM GOAL #5   Title Pt to tolerate compression wraps/ garments in keeping w/ prescribed daily wear regime within 1 week of issue date to progress and retain clinical and functional gains and to limit LE progression.    Baseline dependent    Time 12    Period Weeks    Status Achieved   met for LLE     OT LONG TERM GOAL #6   Title Pt to achieve at least 10% LLE limb volume reduction bilaterally during modified Intensive Phase CDT to reduce infection risk, risk of recurrent leg wounds, to improve functional performance of basic and instrumental ADLs, and to limit LE progression.    Baseline dependent    Time 12    Period Weeks    Status Partially Met   09/28/20 LLE belwo the knee (A-D) decreased by 8.4%. RLE not yet treated.                Plan - 03/29/21 1608    Clinical Impression Statement BLE skin condition in imporved over long weekend with visiting family caregiver assisting with extra skin and nail care bilaterally. Improved hydration and less flaking noted today compared with last few visist.  Swelling is very well controlled.. Pt tolerated LLE MLD, skin care and  knee length gradient compression wraps to LLE  without pain.No Circaid to RLE as CG did not bring garment. Applied wraps to LLE as established. Pt and CG request reducing OT frequency to every other week ( reduce  from 4 x to 2 x) over the month of June due to work demands. ince legs are stable at present  I think this is a good opportunity for a trial of reduced frequency  with request that CG apply alternative compression wraps daily to BLE   . Pt wil return in 2 weeks.    OT Occupational Profile and History Comprehensive Assessment- Review of records and extensive additional review of physical, cognitive, psychosocial history related to current functional performance    Occupational Profile and client history currently impacting functional performance dimensia, inconsistent CG assistance with removing wraps, providing skin care and reapplying compression between clinical visits. S    Occupational performance deficits (Please refer to evaluation for details): ADL's;IADL's;Rest and Sleep;Play;Leisure;Social Participation;Other   body image   Body Structure / Function / Physical Skills ADL;Decreased knowledge of use of DME;Pain;Edema;Scar mobility;Decreased knowledge of precautions;Skin integrity    Rehab Potential Fair    Clinical Decision Making Multiple treatment options, significant modification of task necessary    Comorbidities Affecting Occupational Performance: Presence of comorbidities impacting occupational performance    Modification or Assistance to Complete Evaluation  Max significant modification of tasks or assist is necessary to complete    OT Frequency 1x / week    OT Duration 6 weeks   and PRN   OT Treatment/Interventions Self-care/ADL training;DME and/or AE instruction;Manual lymph drainage;Patient/family education;Compression bandaging;Therapeutic exercise;Manual Therapy;Other (comment)    Plan skin care to decrease infection risk and improve hydration    Recommended Other Services fit with adjustable , alternative compression wraps for optimal independence w/ donning and doffing. Cnsider CircAid with footpieces.    Consulted and Agree with Plan of Care Patient;Family member/caregiver            Patient will benefit from skilled therapeutic intervention in order to improve the following deficits and impairments:   Body Structure / Function / Physical Skills: ADL,Decreased knowledge of use  of DME,Pain,Edema,Scar mobility,Decreased knowledge of precautions,Skin integrity       Visit Diagnosis: Lymphedema, not elsewhere classified    Problem List Patient Active Problem List   Diagnosis Date Noted  . Medication noncompliance due to cognitive impairment 10/17/2019  . Chronic pain of multiple joints 08/01/2017  . Age-related osteoporosis without current pathological fracture 08/01/2017  . Left knee pain 09/19/2016  . Rectal benign neoplasm   . Asthma, chronic 04/04/2015  . history of Tubular adenoma of colon 04/04/2015  . CKD (chronic kidney disease) stage 3, GFR 30-59 ml/min (HCC) 04/03/2015  . Edema 04/14/2014  . Chronic systolic heart failure ([place]) 11/20/2013  . Lymphedema of lower extremity 10/17/2013  . 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 MGUS (monoclonal gammopathy of unknown significance) 05/15/2012  . Vitamin D deficiency 01/29/2010  . DERMATITIS 09/30/2009  . Paranoid delusion ([place]) 08/02/2008  . Obstructive chronic bronchitis ([place]) 08/02/2008  . Essential hypertension 11/11/2007  . Venous (peripheral) insufficiency 11/11/2007  . RENAL CALCULUS 11/11/2007  . LOW BACK PAIN, CHRONIC 11/11/2007    Andrey Spearman, MS, OTR/L, [facility] 03/29/21 4:15 PM  [place] MAIN [facility] SERVICES [address] [place], [place], [address] Phone: [phone]   Fax:  [phone]  Name: [name] MRN: [id number] Date of Birth: 07-21-1932

## 2021-04-04 ENCOUNTER — Ambulatory Visit: Payer: Medicare Other | Admitting: Occupational Therapy

## 2021-04-11 ENCOUNTER — Ambulatory Visit: Payer: Medicare Other | Admitting: Occupational Therapy

## 2021-04-18 ENCOUNTER — Ambulatory Visit: Payer: Medicare Other | Admitting: Occupational Therapy

## 2021-04-25 ENCOUNTER — Other Ambulatory Visit: Payer: Self-pay

## 2021-04-25 ENCOUNTER — Ambulatory Visit: Payer: Medicare Other | Attending: Family | Admitting: Occupational Therapy

## 2021-04-25 DIAGNOSIS — I890 Lymphedema, not elsewhere classified: Secondary | ICD-10-CM | POA: Insufficient documentation

## 2021-04-26 NOTE — Patient Instructions (Signed)
Lymphedema Self- Care Instructions  1. EXERCISE: Perform lymphatic pumping there ex 2 x a day. While wearing your compression wraps or garments. Perform 10 reps of each exercise bilaterally and be sure to perform them in order. Don;t skip around!  OMIT PARTIAL SIT UP  2. MLD: Perform simple self-Manual Lymphatic Drainage (MLD) at least once a day as directed.  3. WRAPS: Compression wraps are to be worn 23 hrs/ 7 days/wk during Intensive Phase of Complete Decongestive Therapy (CDT).Building tolerance may take time and practice, so don't get discouraged. If bandages begin to feel tight during periods of inactivity and/or during the night, try performing your exercises to loosen them.   4. GARMENTS: During Management Phase CDT your compression garments are to be worn during waking hours when active. Do NOT sleep in your garments!!   5. PUT YOUR FEET UP! Elevate your feet and legs and feet to the level of your heart whenever you are sitting down.   6. SKIN: Carefully monitor skin condition and perform impeccable hygiene daily. Bathe skin with mild soap and water and apply low pH lotion (aka Eucerin ) to improve hydration and limit infection risk.

## 2021-04-26 NOTE — Therapy (Signed)
[place] MAIN [facility] SERVICES [address] [place], [place], [address] Phone: [phone]   Fax:  [phone]  Occupational Therapy Treatment Note and Progress Report: Lymphedema Care  Patient Details  Name: [name] MRN: [id number] Date of Birth: 1932-02-15 Referring Provider (OT): Sandrea Hughs, NP   Encounter Date: 04/25/2021   OT End of Session - 04/25/21 1516     Visit Number 30    Number of Visits 36    Date for OT Re-Evaluation 06/13/21    Authorization Time Period 09/28/20 - 01/03/21    OT Start Time 0313    OT Stop Time 0415    OT Time Calculation (min) 62 min    Activity Tolerance Patient tolerated treatment well;No increased pain;Patient limited by pain    Behavior During Therapy [facility] for tasks assessed/performed             Past Medical History:  Diagnosis Date   Anemia, unspecified    Anxiety    Arthritis    Asthma    Delusions ([place])    Depression    Edema 2005   "since put the glass in them"    GERD (gastroesophageal reflux disease)    Lumbago    Lymphedema    Obstructive chronic bronchitis without exacerbation ([place])    Other abnormal glucose    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[place]) 05/02/1286   Systolic heart failure ([place])    EF 35 to 40% per echo January 2015   Unspecified essential hypertension    Unspecified nonpsychotic mental disorder    Unspecified venous (peripheral) insufficiency    Unspecified venous (peripheral) insufficiency    Unspecified vitamin D deficiency     Past Surgical History:  Procedure Laterality Date   APPENDECTOMY     CERVICAL SPINE SURGERY     x 2   CHOLECYSTECTOMY     ESOPHAGOGASTRODUODENOSCOPY N/A 04/05/2015   Procedure: ESOPHAGOGASTRODUODENOSCOPY (EGD);  Surgeon: [name], MD;  Location: [facility];  Service: Endoscopy;  Laterality: N/A;   FLEXIBLE SIGMOIDOSCOPY N/A 04/05/2015   Procedure: FLEXIBLE SIGMOIDOSCOPY;  Surgeon: [name], MD;  Location: [facility];   Service: Endoscopy;  Laterality: N/A;   HAND SURGERY     LUMBAR DISC SURGERY     PARTIAL HYSTERECTOMY     TONSILLECTOMY      There were no vitals filed for this visit.   Subjective Assessment - 04/25/21 1517     Subjective  [name] presents for OT Rx visit 30/36 to address BLE lymphedma. Pt is accompanied by her nephew, [name]. She denies LE related leg paoin today. Pt was last seen on 03/29/21. Treatment frequency was decreased by caregiver request due to seasonal increase in work demands. Pt reports bilateral leg pain of 8/10 today. She brings necessary compression wraps and garments to clinic, clean and rolled and ready to apply. Pt is somewhat less talkative than usual during our visit today.    Pertinent History Chronic obstructive bronchitis, CVI, anxiety , systoloc heart failure, asthma, arthritis, multiple spinal surgeries w/ chronic back pain, DM,    Limitations Difficulty walking, impaired standing tolerance, impaired transfers, decreased joint AROM at toes, ankles and knees, chronic leg swelling , chronic pain. increased infection and falls risks, cognitive impairment, generaized weakness    Repetition Increases Symptoms    Special Tests + Stemmer bilaterally    Patient Stated Goals reduce swelling and legs feel better    Pain Onset Other (comment)  OT Treatments/Exercises (OP) - 04/26/21 1024       ADLs   ADL Education Given Yes      Manual Therapy   Manual Therapy Edema management;Manual Lymphatic Drainage (MLD);Compression Bandaging    Manual Lymphatic Drainage (MLD) MLD to LLE as established. Pants worn to clinic severly limit access to inguinal LN      for proximal MLD.    Compression Bandaging Multilayer, gradient compression wraps applied to LLE using 1 each of short stretch bandage 8, 10 and 12 cm wide over single layer of fRosidal oam and soft cotton stockinett. from base of toes to tibial tuberosity. CircAid adjustable  knee length garment alternative applied to RLE using  ~ 30-40 mmHg.    Other Manual Therapy skin care to BLE with low ph castor oil concurrent w MLD                    OT Education - 04/26/21 1025     Education provided Yes    Education Details Continued Pt/ CG edu for lymphedema self care  and home program throughout session. Topics include multilayer, gradient compression wrapping, simple self-MLD, therapeutic lymphatic pumping exercises, skin/nail care, risk reduction factors and LE precautions, compression garments/recommendations and wear and care schedule and compression garment donning / doffing using assistive devices. All questions answered to the Pt's satisfaction, and Pt demonstrates understanding by report.    Person(s) Educated Patient;Caregiver(s)    Methods Explanation;Demonstration    Comprehension Verbalized understanding;Returned demonstration;Need further instruction                 OT Long Term Goals - 04/25/21 1005       OT LONG TERM GOAL #1   Title With maximum caregiver assistance Pt will be able to apply knee length, multi-layer, short stretch compression wraps daily from ankle to tibial tuberosity on one leg at a time using correct gradient techniques to return affected limb/s, as closely as possible, to premorbid size and shape, to limit leg pain and infection risk, and to improve safe functional mobility and ADLs performance. 01/03/21: Manufacturer's rep coming next week to assist with fitting adjustable, Velcro style , knee length compression leg wraps. Hopefiullly Pt will be able to manage these more independently, and may be more do-able for caregivers. Drawback is they do not manage foot swelling well.    Baseline dependent    Time 4    Period Days    Status Achieved   DC goal.     OT LONG TERM GOAL #2   Title With Maximum caregiver assistance Pt will be able to verbalize signs and symptoms of cellulitis infection and identify lymphedema  precautions using printed resource (modified independence) for reference to decrease infection risk and limit LE progression over time.    Baseline dependent    Time 6    Period Days    Status Achieved      OT LONG TERM GOAL #3   Title With Maximum caregiver assistance Pt >85 % compliant with all daily, LE self-care protocols for home program, including simple self-manual lymphatic drainage (MLD), skin care, lymphatic pumping ther ex, skin care, and compression wraps and garments  to limit LE progression and further functional decline.    Baseline dependent    Time 12    Period Weeks    Status Not Met   DC goal. Pt lives alone and has no consistent CG assistance with home program components other than skin care.  Target Date 06/13/21      OT LONG TERM GOAL #4   Baseline dependent    Time 12    Period Weeks    Status Not Met      OT LONG TERM GOAL #5   Title Pt to tolerate compression wraps/ garments in keeping w/ prescribed daily wear regime within 1 week of issue date to progress and retain clinical and functional gains and to limit LE progression.    Baseline dependent    Time 12    Period Weeks    Status Achieved   met for LLE     OT LONG TERM GOAL #6   Title Pt to achieve at least 10% LLE limb volume reduction bilaterally during modified Intensive Phase CDT to reduce infection risk, risk of recurrent leg wounds, to improve functional performance of basic and instrumental ADLs, and to limit LE progression.    Baseline dependent    Time 12    Period Weeks    Status Partially Met   04/25/21 BLE clearly more swollen today; by visual assessment I estimate 15-20% increase on L and 10% increase on R from distal legs to toes.Volumetrics not taken today due to time constraints.   Target Date 06/13/21                   Plan - 04/26/21 0952     Clinical Impression Statement Pt last seen 5/31 due to caregiver work demands. L leg swelling is moderately increased today. Tissue  is very hard, indurated and inflexible w dense fibrosis from distal leg to toes. RLE is mildly swollen with similar skin presentation. Both limbs present with very dry, cracked skin that is flaking  off in sheets. I suspect this may be due to a significant increase in leg swelling followed by a sudden reduction resulting in excessive skin loss. Pt typically has very dry, flaking skin  on distal legs and feet, but todays presentation is dramatic. Legs are not bleeding and infection signs/ symptoms are absent. Care was taken during gentle MLD and skin care to limit amout of skin friction and debriding as beads of lymphorrhea appear under some areas of dryness. Pt was quiets throughout session without new  complaints and/ or concerns. Compression aplied with short stretch wraps to LLE and adjustable CircAid to RLE. Please refer to Long Term Goals section for details of progress to date. Limb swelling and skin condition continues to fluctuate and are more pronounced when visit intervals are longer. Ms Jill Side continues to benefit from skilled OT for assistance with long-term lymphedema management to manage distal leg and foot swelling, to retain joint AROM necessary for functional mobility and ambulation,  to limit pain and discomfort and optimise quality of life, and to limit infection risk and lymphedema progression. Cont OT 1 x weekly ongoing.    OT Occupational Profile and History Comprehensive Assessment- Review of records and extensive additional review of physical, cognitive, psychosocial history related to current functional performance    Occupational Profile and client history currently impacting functional performance dimensia, inconsistent CG assistance with removing wraps, providing skin care and reapplying compression between clinical visits. S    Occupational performance deficits (Please refer to evaluation for details): ADL's;IADL's;Rest and Sleep;Play;Leisure;Social Participation;Other   body image    Body Structure / Function / Physical Skills ADL;Decreased knowledge of use of DME;Pain;Edema;Scar mobility;Decreased knowledge of precautions;Skin integrity    Rehab Potential Fair    Clinical Decision Making Multiple treatment options, significant  modification of task necessary    Comorbidities Affecting Occupational Performance: Presence of comorbidities impacting occupational performance    Modification or Assistance to Complete Evaluation  Max significant modification of tasks or assist is necessary to complete    OT Frequency 1x / week    OT Duration 6 weeks   and PRN   OT Treatment/Interventions Self-care/ADL training;DME and/or AE instruction;Manual lymph drainage;Patient/family education;Compression bandaging;Therapeutic exercise;Manual Therapy;Other (comment)    Plan skin care to decrease infection risk and improve hydration    Recommended Other Services fit with adjustable , alternative compression wraps for optimal independence w/ donning and doffing. Cnsider CircAid with footpieces.    Consulted and Agree with Plan of Care Patient;Family member/caregiver             Patient will benefit from skilled therapeutic intervention in order to improve the following deficits and impairments:   Body Structure / Function / Physical Skills: ADL, Decreased knowledge of use of DME, Pain, Edema, Scar mobility, Decreased knowledge of precautions, Skin integrity       Visit Diagnosis: Lymphedema, not elsewhere classified    Problem List Patient Active Problem List   Diagnosis Date Noted   Medication noncompliance due to cognitive impairment 10/17/2019   Chronic pain of multiple joints 08/01/2017   Age-related osteoporosis without current pathological fracture 08/01/2017   Left knee pain 09/19/2016   Rectal benign neoplasm    Asthma, chronic 04/04/2015   history of Tubular adenoma of colon 04/04/2015   CKD (chronic kidney disease) stage 3, GFR 30-59 ml/min (HCC) 04/03/2015    Edema 17/40/8144   Chronic systolic heart failure ([place]) 11/20/2013   Lymphedema of lower extremity 10/17/2013   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[place]) 04/04/2013   MGUS (monoclonal gammopathy of unknown significance) 05/15/2012   Vitamin D deficiency 01/29/2010   DERMATITIS 09/30/2009   Paranoid delusion ([place]) 08/02/2008   Obstructive chronic bronchitis ([place]) 08/02/2008   Essential hypertension 11/11/2007   Venous (peripheral) insufficiency 11/11/2007   RENAL CALCULUS 11/11/2007   LOW BACK PAIN, CHRONIC 11/11/2007    Andrey Spearman, MS, OTR/L, CLT-LANA 04/26/21 10:26 AM    [facility] MAIN [facility] SERVICES [place], [place], [address] Phone: [phone]   Fax:  [phone]  Name: PINA SIRIANNI MRN: [id number] Date of Birth: 08-10-1932

## 2021-04-27 ENCOUNTER — Other Ambulatory Visit: Payer: Self-pay

## 2021-04-27 ENCOUNTER — Encounter: Payer: Self-pay | Admitting: Nurse Practitioner

## 2021-04-27 ENCOUNTER — Ambulatory Visit (INDEPENDENT_AMBULATORY_CARE_PROVIDER_SITE_OTHER): Payer: Medicare Other | Admitting: Nurse Practitioner

## 2021-04-27 ENCOUNTER — Other Ambulatory Visit: Payer: Self-pay | Admitting: Nurse Practitioner

## 2021-04-27 VITALS — BP 126/78 | HR 67 | Temp 97.1°F | Ht <= 58 in | Wt 138.0 lb

## 2021-04-27 DIAGNOSIS — I10 Essential (primary) hypertension: Secondary | ICD-10-CM

## 2021-04-27 DIAGNOSIS — I890 Lymphedema, not elsewhere classified: Secondary | ICD-10-CM

## 2021-04-27 DIAGNOSIS — N1832 Chronic kidney disease, stage 3b: Secondary | ICD-10-CM

## 2021-04-27 DIAGNOSIS — M810 Age-related osteoporosis without current pathological fracture: Secondary | ICD-10-CM

## 2021-04-27 DIAGNOSIS — F22 Delusional disorders: Secondary | ICD-10-CM | POA: Diagnosis not present

## 2021-04-27 DIAGNOSIS — I5022 Chronic systolic (congestive) heart failure: Secondary | ICD-10-CM | POA: Diagnosis not present

## 2021-04-27 DIAGNOSIS — J449 Chronic obstructive pulmonary disease, unspecified: Secondary | ICD-10-CM | POA: Diagnosis not present

## 2021-04-27 DIAGNOSIS — I739 Peripheral vascular disease, unspecified: Secondary | ICD-10-CM | POA: Diagnosis not present

## 2021-04-27 DIAGNOSIS — J4489 Other specified chronic obstructive pulmonary disease: Secondary | ICD-10-CM

## 2021-04-27 DIAGNOSIS — R1013 Epigastric pain: Secondary | ICD-10-CM | POA: Diagnosis not present

## 2021-04-27 DIAGNOSIS — J45909 Unspecified asthma, uncomplicated: Secondary | ICD-10-CM

## 2021-04-27 DIAGNOSIS — E559 Vitamin D deficiency, unspecified: Secondary | ICD-10-CM | POA: Diagnosis not present

## 2021-04-27 MED ORDER — POTASSIUM CHLORIDE CRYS ER 20 MEQ PO TBCR: 20.0000 meq | EXTENDED_RELEASE_TABLET | Freq: Every day | ORAL | 0 refills | Status: DC

## 2021-04-27 MED ORDER — DENOSUMAB 60 MG/ML ~~LOC~~ SOSY
60.0000 mg | PREFILLED_SYRINGE | Freq: Once | SUBCUTANEOUS | Status: AC
  Administered 2021-04-27: 60 mg via SUBCUTANEOUS

## 2021-04-27 MED ORDER — ADVAIR HFA 115-21 MCG/ACT IN AERO: INHALATION_SPRAY | RESPIRATORY_TRACT | 5 refills | Status: DC

## 2021-04-27 MED ORDER — OMEPRAZOLE 20 MG PO CPDR: DELAYED_RELEASE_CAPSULE | ORAL | 1 refills | Status: DC

## 2021-04-27 MED ORDER — ALBUTEROL SULFATE HFA 108 (90 BASE) MCG/ACT IN AERS: INHALATION_SPRAY | RESPIRATORY_TRACT | 5 refills | Status: DC

## 2021-04-27 MED ORDER — CALCIUM CARBONATE-VITAMIN D 500-200 MG-UNIT PO TABS
1.0000 | ORAL_TABLET | Freq: Every day | ORAL | 3 refills | Status: DC
Start: 2021-04-27 — End: 2021-08-26

## 2021-04-27 MED ORDER — TORSEMIDE 20 MG PO TABS
20.0000 mg | ORAL_TABLET | Freq: Every day | ORAL | 1 refills | Status: DC
Start: 2021-04-27 — End: 2021-09-01

## 2021-04-27 MED ORDER — HYDRALAZINE HCL 50 MG PO TABS: ORAL_TABLET | ORAL | 1 refills | Status: DC

## 2021-04-27 MED ORDER — CARVEDILOL 6.25 MG PO TABS: ORAL_TABLET | ORAL | 1 refills | Status: DC

## 2021-04-27 NOTE — Progress Notes (Signed)
Careteam: Patient Care Team: Lauree Chandler, NP as PCP - General (Geriatric Medicine) [name], MD (Pulmonary Disease) Jacolyn Reedy, MD as Consulting Physician (Cardiology)  PLACE OF SERVICE:  [place]  Advanced Directive information    Allergies  Allergen Reactions   Aspirin Other (See Comments)    REACTION: upset stomach   Codeine Other (See Comments)    REACTION: "MAKES MY BODY GO CRAZY; CRAMPS"   Latex Nausea And Vomiting    Chief Complaint  Patient presents with   Medical Management of Chronic Issues    3 month follow-up and prolia injection. Here with nephew [name]. Discuss need for shingrix and covid vaccine or exclude. Discuss Advair, patient using as needed versus daily      HPI: Patient is a 87 y.o. female for routine follow up  Question has to what medication she is taking.  Nephew reports he has not been giving her Demadex.   COPD- not taking advair routinely ,  Osteoporosis- cal and vit d twice daily with prolia.   Not  taking zytrec routinely- has seasonal allergies.   Taking potassium 1 tablet daily   Continues on olanzapine- delusions are rare  Review of Systems:  Review of Systems  Constitutional:  Negative for chills, fever and weight loss.  HENT:  Negative for tinnitus.   Respiratory:  Positive for shortness of breath. Negative for cough and sputum production.   Cardiovascular:  Positive for leg swelling. Negative for chest pain and palpitations.  Gastrointestinal:  Negative for abdominal pain, constipation, diarrhea and heartburn.  Genitourinary:  Negative for dysuria, frequency and urgency.  Musculoskeletal:  Negative for back pain, falls, joint pain and myalgias.  Skin: Negative.   Neurological:  Negative for dizziness and headaches.  Psychiatric/Behavioral:  Negative for depression and memory loss. The patient does not have insomnia.    Past Medical History:  Diagnosis Date   Anemia, unspecified    Anxiety     Arthritis    Asthma    Delusions ([place])    Depression    Edema 2005   "since put the glass in them"    GERD (gastroesophageal reflux disease)    Lumbago    Lymphedema    Obstructive chronic bronchitis without exacerbation ([place])    Other abnormal glucose    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[place]) 7/0/2637   Systolic heart failure ([place])    EF 35 to 40% per echo January 2015   Unspecified essential hypertension    Unspecified nonpsychotic mental disorder    Unspecified venous (peripheral) insufficiency    Unspecified venous (peripheral) insufficiency    Unspecified vitamin D deficiency    Past Surgical History:  Procedure Laterality Date   APPENDECTOMY     CERVICAL SPINE SURGERY     x 2   CHOLECYSTECTOMY     ESOPHAGOGASTRODUODENOSCOPY N/A 04/05/2015   Procedure: ESOPHAGOGASTRODUODENOSCOPY (EGD);  Surgeon: [name], MD;  Location: [facility];  Service: Endoscopy;  Laterality: N/A;   FLEXIBLE SIGMOIDOSCOPY N/A 04/05/2015   Procedure: FLEXIBLE SIGMOIDOSCOPY;  Surgeon: [name], MD;  Location: [facility];  Service: Endoscopy;  Laterality: N/A;   HAND SURGERY     LUMBAR DISC SURGERY     PARTIAL HYSTERECTOMY     TONSILLECTOMY     Social History:   reports that she has never smoked. She has never used smokeless tobacco. She reports that she does not drink alcohol and does not use drugs.  Family History  Problem Relation Age of  Onset   Diabetes Mother    Arthritis Father    Diabetes Paternal Grandfather        entire family on both sides   Heart disease Maternal Aunt        entire family of both sides   Cancer Paternal Aunt        type unknown   Diabetes Sister    Breast cancer Cousin     Medications: Patient's Medications  New Prescriptions   No medications on file  Previous Medications   ACETAMINOPHEN (TYLENOL) 650 MG CR TABLET    Take 650 mg by mouth as needed for pain.   ALBUTEROL (PROAIR HFA) 108 (90 BASE) MCG/ACT INHALER    INHALE 2 PUFFS EVERY 4 HOURS AS NEEDED FOR  WHEEZING   CALCIUM-VITAMIN D (OSCAL WITH D) 500-200 MG-UNIT TABLET    Take 1 tablet by mouth daily with breakfast.   CARVEDILOL (COREG) 6.25 MG TABLET    TAKE 1 TABLET BY MOUTH TWICE DAILY IN LUNCH AND SUPPER   CETIRIZINE (ZYRTEC) 10 MG TABLET    Take 1 tablet (10 mg total) by mouth daily.   DENOSUMAB (PROLIA) 60 MG/ML SOSY INJECTION    Inject 60 mg into the skin every 6 (six) months.   FEROSUL 325 (65 FE) MG TABLET    TAKE 1 TABLET(325 MG) BY MOUTH DAILY WITH BREAKFAST   FLUTICASONE-SALMETEROL (ADVAIR HFA) 115-21 MCG/ACT INHALER    INHALE 2 PUFFS BY MOUTH TWICE DAILY   HYDRALAZINE (APRESOLINE) 50 MG TABLET    TAKE 1 TABLET BY MOUTH TWICE DAILY AT 8 AM AND 10 PM   OLANZAPINE (ZYPREXA) 5 MG TABLET    TAKE 1 TABLET(5 MG) BY MOUTH AT BEDTIME   OMEPRAZOLE (PRILOSEC) 20 MG CAPSULE    TAKE 1 CAPSULE(20 MG) BY MOUTH DAILY   POTASSIUM CHLORIDE SA (KLOR-CON) 20 MEQ TABLET    Take 2 tablets (40 mEq total) by mouth 2 (two) times daily. APPOINTMENT DUE   TORSEMIDE (DEMADEX) 20 MG TABLET    Take 1 tablet (20 mg total) by mouth daily.   VITAMIN D, ERGOCALCIFEROL, (DRISDOL) 1.25 MG (50000 UNIT) CAPS CAPSULE    Take 50,000 Units by mouth once a week.  Modified Medications   No medications on file  Discontinued Medications   HYDRALAZINE (APRESOLINE) 25 MG TABLET    Take 25 mg by mouth 2 (two) times daily.    Physical Exam:  Vitals:   04/27/21 1538  BP: 126/78  Pulse: 67  Temp: (!) 97.1 F (36.2 C)  TempSrc: Temporal  SpO2: 97%  Weight: 138 lb (62.6 kg)  Height: 4' 10" (1.473 m)   Body mass index is 28.84 kg/m. Wt Readings from Last 3 Encounters:  04/27/21 138 lb (62.6 kg)  10/01/20 143 lb (64.9 kg)  09/27/20 142 lb (64.4 kg)    Physical Exam Constitutional:      General: She is not in acute distress.    Appearance: She is well-developed. She is not diaphoretic.  HENT:     Head: Normocephalic and atraumatic.     Mouth/Throat:     Pharynx: No oropharyngeal exudate.  Eyes:      Conjunctiva/sclera: Conjunctivae normal.     Pupils: Pupils are equal, round, and reactive to light.  Cardiovascular:     Rate and Rhythm: Normal rate and regular rhythm.     Heart sounds: Normal heart sounds.  Pulmonary:     Effort: Pulmonary effort is normal.     Breath sounds: Normal  breath sounds.  Abdominal:     General: Bowel sounds are normal.     Palpations: Abdomen is soft.  Musculoskeletal:     Cervical back: Normal range of motion and neck supple.     Right lower leg: Edema present.     Left lower leg: Edema present.  Skin:    General: Skin is warm and dry.  Neurological:     Mental Status: She is alert.  Psychiatric:        Mood and Affect: Mood normal.    Labs reviewed: Basic Metabolic Panel: Recent Labs    06/16/20 1843 06/18/20 1824 09/27/20 1634  NA 139 139 138  K 4.2 4.1 3.7  CL 108 108 103  CO2 _0 GLUCOSE 88 84 74  BUN _1 CREATININE 1.08* 1.37* 1.47*  CALCIUM 8.9 8.7* 10.0   Liver Function Tests: Recent Labs    05/26/20 0959 06/18/20 1824 09/27/20 1634  AST _2 ALT _3 ALKPHOS  --  61  --   BILITOT 0.3 0.4 0.4  PROT 8.1 8.5* 8.8*  ALBUMIN  --  3.1*  --    Recent Labs    05/24/20 1508 05/26/20 0959  LIPASE 33 24  AMYLASE 295* 147*   No results for input(s): AMMONIA in the last 8760 hours. CBC: Recent Labs    05/26/20 0959 06/16/20 1843 06/18/20 1824 09/27/20 1634  WBC 4.5 4.3 4.7 4.4  NEUTROABS 2,925  --  3.0 2,710  HGB 10.0* 10.3* 10.3* 10.1*  HCT 32.5* 35.4* 35.0* 31.7*  MCV 86.2 90.1 88.4 85.4  PLT 290 201 226 213   Lipid Panel: No results for input(s): CHOL, HDL, LDLCALC, TRIG, CHOLHDL, LDLDIRECT in the last 8760 hours. TSH: No results for input(s): TSH in the last 8760 hours. A1C: Lab Results  Component Value Date   HGBA1C 5.5 04/14/2014     Assessment/Plan 1. Vitamin D deficiency - Vitamin D, 25-hydroxy  2. Essential hypertension -well controlled on cur - CBC with  Differential/Platelet - CMP with eGFR(Quest)  1. Vitamin D deficiency - Vitamin D, 25-hydroxy - denosumab (PROLIA) injection 60 mg  2. Essential hypertension -controlled on current regimen.  - CBC with Differential/Platelet - CMP with eGFR(Quest) - hydrALAZINE (APRESOLINE) 50 MG tablet; TAKE 1 TABLET BY MOUTH TWICE DAILY AT 8 AM AND 10 PM  Dispense: 180 tablet; Refill: 1  3. Chronic systolic heart failure (HCC) -not on Demadex. Encouraged to restart at this time. Also taking potassium 20 meq daily  - CMP with eGFR(Quest) - carvedilol (COREG) 6.25 MG tablet; TAKE 1 TABLET BY MOUTH TWICE DAILY IN LUNCH AND SUPPER  Dispense: 180 tablet; Refill: 1 - torsemide (DEMADEX) 20 MG tablet; Take 1 tablet (20 mg total) by mouth daily.  Dispense: 90 tablet; Refill: 1 - potassium chloride SA (KLOR-CON) 20 MEQ tablet; Take 1 tablet (20 mEq total) by mouth daily. APPOINTMENT DUE  Dispense: 60 tablet; Refill: 0  4. PVD (peripheral vascular disease) ([place]) Stable at this time.   5. Obstructive chronic bronchitis (HCC) -increase in shortness of breath, not taking advair- refill provided and encouraged to take routinely - albuterol (PROAIR HFA) 108 (90 Base) MCG/ACT inhaler; INHALE 2 PUFFS EVERY 4 HOURS AS NEEDED FOR WHEEZING  Dispense: 18 g; Refill: 5 - fluticasone-salmeterol (ADVAIR HFA) 115-21 MCG/ACT inhaler; INHALE 2 PUFFS BY MOUTH TWICE DAILY  Dispense: 36 g; Refill: 5  6. Age-related osteoporosis without current pathological fracture - denosumab (PROLIA)  injection 60 mg - calcium-vitamin D (OSCAL WITH D) 500-200 MG-UNIT tablet; Take 1 tablet by mouth daily with breakfast.  Dispense: 90 tablet; Refill: 3  7. Stage 3b chronic kidney disease ([place]) Encourage proper hydration and to avoid NSAIDS (Aleve, Advil, Motrin, Ibuprofen)   8. Dyspepsia -controlled on omeprazole.  - omeprazole (PRILOSEC) 20 MG capsule; TAKE 1 CAPSULE(20 MG) BY MOUTH DAILY  Dispense: 90 capsule; Refill: 1  9. Lymphedema of  both lower extremities -continues to go to lymphedema clinic.  - torsemide (DEMADEX) 20 MG tablet; Take 1 tablet (20 mg total) by mouth daily.  Dispense: 90 tablet; Refill: 1 - potassium chloride SA (KLOR-CON) 20 MEQ tablet; Take 1 tablet (20 mEq total) by mouth daily. APPOINTMENT DUE  Dispense: 60 tablet; Refill: 0  11. Paranoid delusion ([place]) Stable on olanzapine 5 mg daily, occasional delusion but controlled.   Next appt: 3 months Jamien Casanova K. [place], [facility] [phone]

## 2021-04-27 NOTE — Patient Instructions (Signed)
Call and let us know if inhaler is not affordable.  Make sure you are taking medication that we have listed

## 2021-04-28 ENCOUNTER — Other Ambulatory Visit: Payer: Self-pay | Admitting: Nurse Practitioner

## 2021-04-28 DIAGNOSIS — F22 Delusional disorders: Secondary | ICD-10-CM

## 2021-04-28 DIAGNOSIS — R1013 Epigastric pain: Secondary | ICD-10-CM

## 2021-04-28 LAB — COMPLETE METABOLIC PANEL WITH GFR
AG Ratio: 0.8 (calc) — ABNORMAL LOW (ref 1.0–2.5)
ALT: 8 U/L (ref 6–29)
AST: 16 U/L (ref 10–35)
Albumin: 3.7 g/dL (ref 3.6–5.1)
Alkaline phosphatase (APISO): 87 U/L (ref 37–153)
BUN/Creatinine Ratio: 16 (calc) (ref 6–22)
BUN: 26 mg/dL — ABNORMAL HIGH (ref 7–25)
CO2: 23 mmol/L (ref 20–32)
Calcium: 9.8 mg/dL (ref 8.6–10.4)
Chloride: 105 mmol/L (ref 98–110)
Creat: 1.58 mg/dL — ABNORMAL HIGH (ref 0.60–0.88)
GFR, Est African American: 34 mL/min/{1.73_m2} — ABNORMAL LOW (ref >=60)
GFR, Est Non African American: 29 mL/min/{1.73_m2} — ABNORMAL LOW (ref >=60)
Globulin: 4.4 g/dL (calc) — ABNORMAL HIGH (ref 1.9–3.7)
Glucose, Bld: 79 mg/dL (ref 65–99)
Potassium: 4 mmol/L (ref 3.5–5.3)
Sodium: 140 mmol/L (ref 135–146)
Total Bilirubin: 0.5 mg/dL (ref 0.2–1.2)
Total Protein: 8.1 g/dL (ref 6.1–8.1)

## 2021-04-28 LAB — CBC WITH DIFFERENTIAL/PLATELET
Absolute Monocytes: 432 cells/uL (ref 200–950)
Basophils Absolute: 11 cells/uL (ref 0–200)
Basophils Relative: 0.2 %
Eosinophils Absolute: 0 cells/uL — ABNORMAL LOW (ref 15–500)
Eosinophils Relative: 0 %
HCT: 31.3 % — ABNORMAL LOW (ref 35.0–45.0)
Hemoglobin: 9.8 g/dL — ABNORMAL LOW (ref 11.7–15.5)
Lymphs Abs: 1436 cells/uL (ref 850–3900)
MCH: 27.2 pg (ref 27.0–33.0)
MCHC: 31.3 g/dL — ABNORMAL LOW (ref 32.0–36.0)
MCV: 86.9 fL (ref 80.0–100.0)
MPV: 9.7 fL (ref 7.5–12.5)
Monocytes Relative: 8 %
Neutro Abs: 3521 cells/uL (ref 1500–7800)
Neutrophils Relative %: 65.2 %
Platelets: 249 10*3/uL (ref 140–400)
RBC: 3.6 10*6/uL — ABNORMAL LOW (ref 3.80–5.10)
RDW: 14.1 % (ref 11.0–15.0)
Total Lymphocyte: 26.6 %
WBC: 5.4 10*3/uL (ref 3.8–10.8)

## 2021-04-28 LAB — VITAMIN D 25 HYDROXY (VIT D DEFICIENCY, FRACTURES): Vit D, 25-Hydroxy: 64 ng/mL (ref 30–100)

## 2021-05-03 ENCOUNTER — Ambulatory Visit: Payer: Medicare Other | Admitting: Occupational Therapy

## 2021-05-09 ENCOUNTER — Other Ambulatory Visit: Payer: Self-pay

## 2021-05-09 ENCOUNTER — Ambulatory Visit: Payer: Medicare Other | Attending: Family | Admitting: Occupational Therapy

## 2021-05-09 DIAGNOSIS — I890 Lymphedema, not elsewhere classified: Secondary | ICD-10-CM

## 2021-05-10 NOTE — Therapy (Signed)
[place] MAIN [facility] SERVICES [address] [place], [place], [address] Phone: [phone]   Fax:  [phone]  Occupational Therapy Treatment  Patient Details  Name: [name] MRN: [id number] Date of Birth: [birth year] Referring Provider (OT): Sandrea Hughs, NP   Encounter Date: 05/09/2021   OT End of Session - 05/10/21 1046     Visit Number 31    Number of Visits 59    Date for OT Re-Evaluation 06/13/21    Authorization Time Period 09/28/20 - 01/03/21    OT Start Time 0309    OT Stop Time 8185    OT Time Calculation (min) 66 min    Activity Tolerance Patient tolerated treatment well;Patient limited by pain    Behavior During Therapy [facility] for tasks assessed/performed             Past Medical History:  Diagnosis Date   Anemia, unspecified    Anxiety    Arthritis    Asthma    Delusions ([place])    Depression    Edema 2005   "since put the glass in them"    GERD (gastroesophageal reflux disease)    Lumbago    Lymphedema    Obstructive chronic bronchitis without exacerbation ([place])    Other abnormal glucose    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[place]) 04/01/1496   Systolic heart failure ([place])    EF 35 to 40% per echo January 2015   Unspecified essential hypertension    Unspecified nonpsychotic mental disorder    Unspecified venous (peripheral) insufficiency    Unspecified venous (peripheral) insufficiency    Unspecified vitamin D deficiency     Past Surgical History:  Procedure Laterality Date   APPENDECTOMY     CERVICAL SPINE SURGERY     x 2   CHOLECYSTECTOMY     ESOPHAGOGASTRODUODENOSCOPY N/A 04/05/2015   Procedure: ESOPHAGOGASTRODUODENOSCOPY (EGD);  Surgeon: [name], MD;  Location: [facility];  Service: Endoscopy;  Laterality: N/A;   FLEXIBLE SIGMOIDOSCOPY N/A 04/05/2015   Procedure: FLEXIBLE SIGMOIDOSCOPY;  Surgeon: [name], MD;  Location: [facility];  Service: Endoscopy;  Laterality: N/A;   HAND SURGERY      LUMBAR DISC SURGERY     PARTIAL HYSTERECTOMY     TONSILLECTOMY      There were no vitals filed for this visit.   Subjective Assessment - 05/10/21 1055     Subjective  [name] presents for OT Rx visit 31/36 to address BLE lymphedma. Pt is accompanied by her nephew, [name].  Pt reports bilateral leg pain but does not rate it numericcally. Pt and family member deny other concerns today.    Pertinent History Chronic obstructive bronchitis, CVI, anxiety , systoloc heart failure, asthma, arthritis, multiple spinal surgeries w/ chronic back pain, DM,    Limitations Difficulty walking, impaired standing tolerance, impaired transfers, decreased joint AROM at toes, ankles and knees, chronic leg swelling , chronic pain. increased infection and falls risks, cognitive impairment, generaized weakness    Repetition Increases Symptoms    Special Tests + Stemmer bilaterally    Patient Stated Goals reduce swelling and legs feel better    Pain Onset Other (comment)                          OT Treatments/Exercises (OP) - 05/10/21 1057       Manual Therapy   Manual Therapy Edema management;Manual Lymphatic Drainage (MLD);Compression Bandaging    Manual Lymphatic  Drainage (MLD) MLD to LLE as established. Pants worn to clinic severly limit access to inguinal LN      for proximal MLD.    Compression Bandaging Multilayer, gradient compression wraps applied to LLE using 1 each of short stretch bandage 8, 10 and 12 cm wide over single layer of fRosidal oam and soft cotton stockinett. from base of toes to tibial tuberosity. CircAid adjustable knee length garment alternative applied to RLE using  ~ 30-40 mmHg.    Other Manual Therapy skin care to BLE with low ph castor oil concurrent w MLD                    OT Education - 05/10/21 1057     Education provided Yes    Education Details Continued Pt/ CG edu for lymphedema self care  and home program throughout session. Topics  include multilayer, gradient compression wrapping, simple self-MLD, therapeutic lymphatic pumping exercises, skin/nail care, risk reduction factors and LE precautions, compression garments/recommendations and wear and care schedule and compression garment donning / doffing using assistive devices. All questions answered to the Pt's satisfaction, and Pt demonstrates understanding by report.    Person(s) Educated Patient;Caregiver(s)    Methods Explanation;Demonstration    Comprehension Verbalized understanding;Returned demonstration;Need further instruction                 OT Long Term Goals - 04/25/21 1005       OT LONG TERM GOAL #1   Title With maximum caregiver assistance Pt will be able to apply knee length, multi-layer, short stretch compression wraps daily from ankle to tibial tuberosity on one leg at a time using correct gradient techniques to return affected limb/s, as closely as possible, to premorbid size and shape, to limit leg pain and infection risk, and to improve safe functional mobility and ADLs performance. 01/03/21: Manufacturer's rep coming next week to assist with fitting adjustable, Velcro style , knee length compression leg wraps. Hopefiullly Pt will be able to manage these more independently, and may be more do-able for caregivers. Drawback is they do not manage foot swelling well.    Baseline dependent    Time 4    Period Days    Status Achieved   DC goal.     OT LONG TERM GOAL #2   Title With Maximum caregiver assistance Pt will be able to verbalize signs and symptoms of cellulitis infection and identify lymphedema precautions using printed resource (modified independence) for reference to decrease infection risk and limit LE progression over time.    Baseline dependent    Time 6    Period Days    Status Achieved      OT LONG TERM GOAL #3   Title With Maximum caregiver assistance Pt >85 % compliant with all daily, LE self-care protocols for home program,  including simple self-manual lymphatic drainage (MLD), skin care, lymphatic pumping ther ex, skin care, and compression wraps and garments  to limit LE progression and further functional decline.    Baseline dependent    Time 12    Period Weeks    Status Not Met   DC goal. Pt lives alone and has no consistent CG assistance with home program components other than skin care.   Target Date 06/13/21      OT LONG TERM GOAL #4   Baseline dependent    Time 12    Period Weeks    Status Not Met      OT LONG TERM  GOAL #5   Title Pt to tolerate compression wraps/ garments in keeping w/ prescribed daily wear regime within 1 week of issue date to progress and retain clinical and functional gains and to limit LE progression.    Baseline dependent    Time 12    Period Weeks    Status Achieved   met for LLE     OT LONG TERM GOAL #6   Title Pt to achieve at least 10% LLE limb volume reduction bilaterally during modified Intensive Phase CDT to reduce infection risk, risk of recurrent leg wounds, to improve functional performance of basic and instrumental ADLs, and to limit LE progression.    Baseline dependent    Time 12    Period Weeks    Status Partially Met   04/25/21 BLE clearly more swollen today; by visual assessment I estimate 15-20% increase on L and 10% increase on R from distal legs to toes.Volumetrics not taken today due to time constraints.   Target Date 06/13/21                   Plan - 05/10/21 1047     Clinical Impression Statement Limb swelling and skin condition continues to fluctuate widely between visits. Bilateral leg swelling is increased again today.Severe skin dryness persists, but excessive flaking is much reduced since last visit. No open wounds, or areas of other skin breakdown, are observed. Provided MLD and simultaneous skin care to LLE with excellent tolerance. Applied LLE gradient compression wraps and CircAid adjustable garment alternative to the RLE as  established. Cont as per POC in an effort to stabilized limb volume and limit skin breakdown and infection risk.    OT Occupational Profile and History Comprehensive Assessment- Review of records and extensive additional review of physical, cognitive, psychosocial history related to current functional performance    Occupational Profile and client history currently impacting functional performance dimensia, inconsistent CG assistance with removing wraps, providing skin care and reapplying compression between clinical visits. S    Occupational performance deficits (Please refer to evaluation for details): ADL's;IADL's;Rest and Sleep;Play;Leisure;Social Participation;Other   body image   Body Structure / Function / Physical Skills ADL;Decreased knowledge of use of DME;Pain;Edema;Scar mobility;Decreased knowledge of precautions;Skin integrity    Rehab Potential Fair    Clinical Decision Making Multiple treatment options, significant modification of task necessary    Comorbidities Affecting Occupational Performance: Presence of comorbidities impacting occupational performance    Modification or Assistance to Complete Evaluation  Max significant modification of tasks or assist is necessary to complete    OT Frequency 1x / week    OT Duration 6 weeks   and PRN   OT Treatment/Interventions Self-care/ADL training;DME and/or AE instruction;Manual lymph drainage;Patient/family education;Compression bandaging;Therapeutic exercise;Manual Therapy;Other (comment)    Plan skin care to decrease infection risk and improve hydration    Recommended Other Services fit with adjustable , alternative compression wraps for optimal independence w/ donning and doffing. Cnsider CircAid with footpieces.    Consulted and Agree with Plan of Care Patient;Family member/caregiver             Patient will benefit from skilled therapeutic intervention in order to improve the following deficits and impairments:   Body Structure  / Function / Physical Skills: ADL, Decreased knowledge of use of DME, Pain, Edema, Scar mobility, Decreased knowledge of precautions, Skin integrity       Visit Diagnosis: Lymphedema, not elsewhere classified    Problem List Patient Active Problem List  Diagnosis Date Noted   PVD (peripheral vascular disease) ([place]) 04/27/2021   Medication noncompliance due to cognitive impairment 10/17/2019   Chronic pain of multiple joints 08/01/2017   Age-related osteoporosis without current pathological fracture 08/01/2017   Left knee pain 09/19/2016   Rectal benign neoplasm    Asthma, chronic 04/04/2015   history of Tubular adenoma of colon 04/04/2015   CKD (chronic kidney disease) stage 3, GFR 30-59 ml/min (HCC) 04/03/2015   Edema 57/32/2567   Chronic systolic heart failure ([place]) 11/20/2013   Lymphedema of lower extremity 10/17/2013   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[place]) 04/04/2013   MGUS (monoclonal gammopathy of unknown significance) 05/15/2012   Vitamin D deficiency 01/29/2010   DERMATITIS 09/30/2009   Paranoid delusion ([place]) 08/02/2008   Obstructive chronic bronchitis ([place]) 08/02/2008   Essential hypertension 11/11/2007   Venous (peripheral) insufficiency 11/11/2007   RENAL CALCULUS 11/11/2007   LOW BACK PAIN, CHRONIC 11/11/2007   Andrey Spearman, MS, OTR/L, CLT-LANA 05/10/21 10:59 AM   [place] [facility] MAIN [facility] SERVICES [place], [place], [address] Phone: [phone]   Fax:  [phone]  Name: [name] MRN: [id number] Date of Birth: [birth year]

## 2021-05-10 NOTE — Patient Instructions (Signed)
Lymphedema Self- Care Instructions  1. EXERCISE: Perform lymphatic pumping there ex 2 x a day. While wearing your compression wraps or garments. Perform 10 reps of each exercise bilaterally and be sure to perform them in order. Don;t skip around!  OMIT PARTIAL SIT UPs.  2. MLD: Perform simple self-Manual Lymphatic Drainage (MLD) at least once a day as directed.  3. If you have a Flexitouch advanced "pump" use it 1 time each day on a single limb only. The Flexitouch moves lymphatic fluid out of your affected body part and back to your heart, so DO NOT use the Flexi on 2 legs at a time, and DO NOT ues it on 2 legs on the same day. If you experience any atypical shortness of breath, sudden onset of pain, or feelings of heart arhythmia, or racing, discontinue use of the Flexitouch and report these symptoms to your doctor right away.  4. WRAPS: Compression wraps are to be worn 23 hrs/ 7 days/wk during Intensive Phase of Complete Decongestive Therapy (CDT).Building tolerance may take time and practice, so don't get discouraged. If bandages begin to feel tight during periods of inactivity and/or during the night, try performing your exercises to loosen them.   5. GARMENTS: During Management Phase CDT your compression garments are to be worn during waking hours when active. Do NOT sleep in your garments!!   6. PUT YOUR FEET UP! Elevate your feet and legs and feet to the level of your heart whenever you are sitting down.   7. SKIN: Carefully monitor skin condition and perform impeccable hygiene daily. Bathe skin with mild soap and water and apply low pH lotion (aka Eucerin ) to improve hydration and limit infection risk.

## 2021-05-16 ENCOUNTER — Other Ambulatory Visit: Payer: Self-pay

## 2021-05-16 ENCOUNTER — Ambulatory Visit: Payer: Medicare Other | Admitting: Occupational Therapy

## 2021-05-16 DIAGNOSIS — I890 Lymphedema, not elsewhere classified: Secondary | ICD-10-CM | POA: Diagnosis not present

## 2021-05-16 NOTE — Therapy (Signed)
[place] MAIN [facility] SERVICES [address] [place], [place], [address] Phone: [phone]   Fax:  [phone]  Occupational Therapy Treatment  Patient Details  Name: [name] MRN: [id number] Date of Birth: July 31, 1932 Referring Provider (OT): Sandrea Hughs, NP   Encounter Date: 05/16/2021   OT End of Session - 05/16/21 1613     Visit Number 32    Number of Visits 66    Date for OT Re-Evaluation 06/13/21    Authorization Time Period 09/28/20 - 01/03/21    OT Start Time 0305    OT Stop Time 0410    OT Time Calculation (min) 65 min    Activity Tolerance Patient tolerated treatment well;Patient limited by pain    Behavior During Therapy [facility] for tasks assessed/performed             Past Medical History:  Diagnosis Date   Anemia, unspecified    Anxiety    Arthritis    Asthma    Delusions ([place])    Depression    Edema 2005   "since put the glass in them"    GERD (gastroesophageal reflux disease)    Lumbago    Lymphedema    Obstructive chronic bronchitis without exacerbation ([place])    Other abnormal glucose    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[place]) 2/0/9470   Systolic heart failure ([place])    EF 35 to 40% per echo January 2015   Unspecified essential hypertension    Unspecified nonpsychotic mental disorder    Unspecified venous (peripheral) insufficiency    Unspecified venous (peripheral) insufficiency    Unspecified vitamin D deficiency     Past Surgical History:  Procedure Laterality Date   APPENDECTOMY     CERVICAL SPINE SURGERY     x 2   CHOLECYSTECTOMY     ESOPHAGOGASTRODUODENOSCOPY N/A 04/05/2015   Procedure: ESOPHAGOGASTRODUODENOSCOPY (EGD);  Surgeon: [name], MD;  Location: [facility];  Service: Endoscopy;  Laterality: N/A;   FLEXIBLE SIGMOIDOSCOPY N/A 04/05/2015   Procedure: FLEXIBLE SIGMOIDOSCOPY;  Surgeon: [name], MD;  Location: [facility];  Service: Endoscopy;  Laterality: N/A;   HAND SURGERY      LUMBAR DISC SURGERY     PARTIAL HYSTERECTOMY     TONSILLECTOMY      There were no vitals filed for this visit.   Subjective Assessment - 05/16/21 1615     Subjective  [name] presents for OT Rx visit 32/36 to address BLE lymphedma. Pt is accompanied by her nephew, [name].  Pt reports bilateral leg pain but does not rate it numericcally. Pt and family member deny other concerns today. Nephew reports they have a paid caregiver who is assisting Pt with her skin care.    Pertinent History Chronic obstructive bronchitis, CVI, anxiety , systoloc heart failure, asthma, arthritis, multiple spinal surgeries w/ chronic back pain, DM,    Limitations Difficulty walking, impaired standing tolerance, impaired transfers, decreased joint AROM at toes, ankles and knees, chronic leg swelling , chronic pain. increased infection and falls risks, cognitive impairment, generaized weakness    Repetition Increases Symptoms    Special Tests + Stemmer bilaterally    Patient Stated Goals reduce swelling and legs feel better    Pain Onset Other (comment)                          OT Treatments/Exercises (OP) - 05/16/21 1616       ADLs  ADL Education Given Yes      Manual Therapy   Manual Therapy Edema management;Manual Lymphatic Drainage (MLD);Compression Bandaging    Manual Lymphatic Drainage (MLD) MLD to LLE as established. Pants worn to clinic severly limit access to inguinal LN      for proximal MLD.    Compression Bandaging Multilayer, gradient compression wraps applied to LLE using 1 each of short stretch bandage 8, 10 and 12 cm wide over single layer of fRosidal oam and soft cotton stockinett. from base of toes to tibial tuberosity. CircAid adjustable knee length garment alternative applied to RLE using  ~ 30-40 mmHg.    Other Manual Therapy skin care to BLE with low ph castor oil concurrent w MLD                    OT Education - 05/16/21 1616     Education provided  Yes    Education Details Continued Pt/ CG edu for lymphedema self care  and home program throughout session. Topics include multilayer, gradient compression wrapping, simple self-MLD, therapeutic lymphatic pumping exercises, skin/nail care, risk reduction factors and LE precautions, compression garments/recommendations and wear and care schedule and compression garment donning / doffing using assistive devices. All questions answered to the Pt's satisfaction, and Pt demonstrates understanding by report.    Person(s) Educated Patient;Caregiver(s)    Methods Explanation;Demonstration    Comprehension Verbalized understanding;Returned demonstration;Need further instruction                 OT Long Term Goals - 04/25/21 1005       OT LONG TERM GOAL #1   Title With maximum caregiver assistance Pt will be able to apply knee length, multi-layer, short stretch compression wraps daily from ankle to tibial tuberosity on one leg at a time using correct gradient techniques to return affected limb/s, as closely as possible, to premorbid size and shape, to limit leg pain and infection risk, and to improve safe functional mobility and ADLs performance. 01/03/21: Manufacturer's rep coming next week to assist with fitting adjustable, Velcro style , knee length compression leg wraps. Hope[place]ly Pt will be able to manage these more independently, and may be more do-able for caregivers. Drawback is they do not manage foot swelling well.    Baseline dependent    Time 4    Period Days    Status Achieved   DC goal.     OT LONG TERM GOAL #2   Title With Maximum caregiver assistance Pt will be able to verbalize signs and symptoms of cellulitis infection and identify lymphedema precautions using printed resource (modified independence) for reference to decrease infection risk and limit LE progression over time.    Baseline dependent    Time 6    Period Days    Status Achieved      OT LONG TERM GOAL #3   Title  With Maximum caregiver assistance Pt >85 % compliant with all daily, LE self-care protocols for home program, including simple self-manual lymphatic drainage (MLD), skin care, lymphatic pumping ther ex, skin care, and compression wraps and garments  to limit LE progression and further functional decline.    Baseline dependent    Time 12    Period Weeks    Status Not Met   DC goal. Pt lives alone and has no consistent CG assistance with home program components other than skin care.   Target Date 06/13/21      OT LONG TERM GOAL #4  Baseline dependent    Time 12    Period Weeks    Status Not Met      OT LONG TERM GOAL #5   Title Pt to tolerate compression wraps/ garments in keeping w/ prescribed daily wear regime within 1 week of issue date to progress and retain clinical and functional gains and to limit LE progression.    Baseline dependent    Time 12    Period Weeks    Status Achieved   met for LLE     OT LONG TERM GOAL #6   Title Pt to achieve at least 10% LLE limb volume reduction bilaterally during modified Intensive Phase CDT to reduce infection risk, risk of recurrent leg wounds, to improve functional performance of basic and instrumental ADLs, and to limit LE progression.    Baseline dependent    Time 12    Period Weeks    Status Partially Met   04/25/21 BLE clearly more swollen today; by visual assessment I estimate 15-20% increase on L and 10% increase on R from distal legs to toes.Volumetrics not taken today due to time constraints.   Target Date 06/13/21                   Plan - 05/16/21 1614     Clinical Impression Statement Continued MLD, skin care and gradient compression to LLE below the knee. Applied compression bandages to LL and alternative  CircAid wraps to RLE without increased pain. Skin on dorsal feet is peeling and cracked, as has been typical for this patient over time. Signs/ symptoms of infection are absent. Cont to support patient for skilled  lymphedema care to limit infection risk and progression of this chronic condition. Without ongoing OT support condition will worsen and further functional decline is expected.    OT Occupational Profile and History Comprehensive Assessment- Review of records and extensive additional review of physical, cognitive, psychosocial history related to current functional performance    Occupational Profile and client history currently impacting functional performance dimensia, inconsistent CG assistance with removing wraps, providing skin care and reapplying compression between clinical visits. S    Occupational performance deficits (Please refer to evaluation for details): ADL's;IADL's;Rest and Sleep;Play;Leisure;Social Participation;Other   body image   Body Structure / Function / Physical Skills ADL;Decreased knowledge of use of DME;Pain;Edema;Scar mobility;Decreased knowledge of precautions;Skin integrity    Rehab Potential Fair    Clinical Decision Making Multiple treatment options, significant modification of task necessary    Comorbidities Affecting Occupational Performance: Presence of comorbidities impacting occupational performance    Modification or Assistance to Complete Evaluation  Max significant modification of tasks or assist is necessary to complete    OT Frequency 1x / week    OT Duration 6 weeks   and PRN   OT Treatment/Interventions Self-care/ADL training;DME and/or AE instruction;Manual lymph drainage;Patient/family education;Compression bandaging;Therapeutic exercise;Manual Therapy;Other (comment)    Plan skin care to decrease infection risk and improve hydration    Recommended Other Services fit with adjustable , alternative compression wraps for optimal independence w/ donning and doffing. Cnsider CircAid with footpieces.    Consulted and Agree with Plan of Care Patient;Family member/caregiver             Patient will benefit from skilled therapeutic intervention in order to  improve the following deficits and impairments:   Body Structure / Function / Physical Skills: ADL, Decreased knowledge of use of DME, Pain, Edema, Scar mobility, Decreased knowledge of precautions, Skin integrity  Visit Diagnosis: Lymphedema, not elsewhere classified    Problem List Patient Active Problem List   Diagnosis Date Noted   PVD (peripheral vascular disease) ([place]) 04/27/2021   Medication noncompliance due to cognitive impairment 10/17/2019   Chronic pain of multiple joints 08/01/2017   Age-related osteoporosis without current pathological fracture 08/01/2017   Left knee pain 09/19/2016   Rectal benign neoplasm    Asthma, chronic 04/04/2015   history of Tubular adenoma of colon 04/04/2015   CKD (chronic kidney disease) stage 3, GFR 30-59 ml/min (HCC) 04/03/2015   Edema 43/56/8616   Chronic systolic heart failure ([place]) 11/20/2013   Lymphedema of lower extremity 10/17/2013   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[place]) 04/04/2013   MGUS (monoclonal gammopathy of unknown significance) 05/15/2012   Vitamin D deficiency 01/29/2010   DERMATITIS 09/30/2009   Paranoid delusion ([place]) 08/02/2008   Obstructive chronic bronchitis ([place]) 08/02/2008   Essential hypertension 11/11/2007   Venous (peripheral) insufficiency 11/11/2007   RENAL CALCULUS 11/11/2007   LOW BACK PAIN, CHRONIC 11/11/2007    Andrey Spearman, MS, OTR/L, CLT-LANA 05/16/21 4:17 PM   [place] MAIN [facility] SERVICES [place], [place], [address] Phone: [phone]   Fax:  [phone]  Name: [name] MRN: [id number] Date of Birth: [birth year]

## 2021-05-23 ENCOUNTER — Other Ambulatory Visit: Payer: Self-pay

## 2021-05-23 ENCOUNTER — Ambulatory Visit: Payer: Medicare Other | Admitting: Occupational Therapy

## 2021-05-23 DIAGNOSIS — I890 Lymphedema, not elsewhere classified: Secondary | ICD-10-CM

## 2021-05-23 NOTE — Therapy (Signed)
[place] MAIN [facility] SERVICES [address] [place], [place], [address] Phone: [phone]   Fax:  [phone]  Occupational Therapy Treatment  Patient Details  Name: [name] MRN: [id number] Date of Birth: 09-11-1932 Referring Provider (OT): Sandrea Hughs, NP   Encounter Date: 05/23/2021   OT End of Session - 05/23/21 1520     Visit Number 33    Number of Visits 47    Date for OT Re-Evaluation 06/13/21    Authorization Time Period 09/28/20 - 01/03/21    OT Start Time 0310    OT Stop Time 0410    OT Time Calculation (min) 60 min    Activity Tolerance Patient tolerated treatment well;Patient limited by pain    Behavior During Therapy [facility] for tasks assessed/performed             Past Medical History:  Diagnosis Date   Anemia, unspecified    Anxiety    Arthritis    Asthma    Delusions ([place])    Depression    Edema 2005   "since put the glass in them"    GERD (gastroesophageal reflux disease)    Lumbago    Lymphedema    Obstructive chronic bronchitis without exacerbation ([place])    Other abnormal glucose    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[place]) 12/02/6331   Systolic heart failure ([place])    EF 35 to 40% per echo January 2015   Unspecified essential hypertension    Unspecified nonpsychotic mental disorder    Unspecified venous (peripheral) insufficiency    Unspecified venous (peripheral) insufficiency    Unspecified vitamin D deficiency     Past Surgical History:  Procedure Laterality Date   APPENDECTOMY     CERVICAL SPINE SURGERY     x 2   CHOLECYSTECTOMY     ESOPHAGOGASTRODUODENOSCOPY N/A 04/05/2015   Procedure: ESOPHAGOGASTRODUODENOSCOPY (EGD);  Surgeon: [name], MD;  Location: [facility];  Service: Endoscopy;  Laterality: N/A;   FLEXIBLE SIGMOIDOSCOPY N/A 04/05/2015   Procedure: FLEXIBLE SIGMOIDOSCOPY;  Surgeon: [name], MD;  Location: [facility];  Service: Endoscopy;  Laterality: N/A;   HAND SURGERY      LUMBAR DISC SURGERY     PARTIAL HYSTERECTOMY     TONSILLECTOMY      There were no vitals filed for this visit.   Subjective Assessment - 05/23/21 1609     Subjective  [name] presents for OT Rx visit 33/36 to address BLE lymphedma. Pt is accompanied by her nephew, [name].  Pt reports bilateral leg pain but does not rate it numericcally. Pt and family member deny other concerns today.    Pertinent History Chronic obstructive bronchitis, CVI, anxiety , systoloc heart failure, asthma, arthritis, multiple spinal surgeries w/ chronic back pain, DM,    Limitations Difficulty walking, impaired standing tolerance, impaired transfers, decreased joint AROM at toes, ankles and knees, chronic leg swelling , chronic pain. increased infection and falls risks, cognitive impairment, generaized weakness    Repetition Increases Symptoms    Special Tests + Stemmer bilaterally    Patient Stated Goals reduce swelling and legs feel better    Pain Onset Other (comment)                          OT Treatments/Exercises (OP) - 05/23/21 1610       ADLs   ADL Education Given Yes      Manual Therapy   Manual  Therapy Edema management;Manual Lymphatic Drainage (MLD);Compression Bandaging    Manual Lymphatic Drainage (MLD) MLD to LLE as established. Pants worn to clinic severly limit access to inguinal LN      for proximal MLD.    Compression Bandaging Multilayer, gradient compression wraps applied to LLE using 1 each of short stretch bandage 8, 10 and 12 cm wide over single layer of fRosidal oam and soft cotton stockinett. from base of toes to tibial tuberosity. CircAid adjustable knee length garment alternative applied to RLE using  ~ 30-40 mmHg.    Other Manual Therapy skin care to BLE with low ph castor oil concurrent w MLD                    OT Education - 05/23/21 1611     Education provided Yes    Education Details Continued Pt/ CG edu for lymphedema self care  and home  program throughout session. Topics include multilayer, gradient compression wrapping, simple self-MLD, therapeutic lymphatic pumping exercises, skin/nail care, risk reduction factors and LE precautions, compression garments/recommendations and wear and care schedule and compression garment donning / doffing using assistive devices. All questions answered to the Pt's satisfaction, and Pt demonstrates understanding by report.    Person(s) Educated Patient;Caregiver(s)    Methods Explanation;Demonstration    Comprehension Verbalized understanding;Returned demonstration;Need further instruction                 OT Long Term Goals - 04/25/21 1005       OT LONG TERM GOAL #1   Title With maximum caregiver assistance Pt will be able to apply knee length, multi-layer, short stretch compression wraps daily from ankle to tibial tuberosity on one leg at a time using correct gradient techniques to return affected limb/s, as closely as possible, to premorbid size and shape, to limit leg pain and infection risk, and to improve safe functional mobility and ADLs performance. 01/03/21: Manufacturer's rep coming next week to assist with fitting adjustable, Velcro style , knee length compression leg wraps. Hopefiullly Pt will be able to manage these more independently, and may be more do-able for caregivers. Drawback is they do not manage foot swelling well.    Baseline dependent    Time 4    Period Days    Status Achieved   DC goal.     OT LONG TERM GOAL #2   Title With Maximum caregiver assistance Pt will be able to verbalize signs and symptoms of cellulitis infection and identify lymphedema precautions using printed resource (modified independence) for reference to decrease infection risk and limit LE progression over time.    Baseline dependent    Time 6    Period Days    Status Achieved      OT LONG TERM GOAL #3   Title With Maximum caregiver assistance Pt >85 % compliant with all daily, LE self-care  protocols for home program, including simple self-manual lymphatic drainage (MLD), skin care, lymphatic pumping ther ex, skin care, and compression wraps and garments  to limit LE progression and further functional decline.    Baseline dependent    Time 12    Period Weeks    Status Not Met   DC goal. Pt lives alone and has no consistent CG assistance with home program components other than skin care.   Target Date 06/13/21      OT LONG TERM GOAL #4   Baseline dependent    Time 12    Period Weeks  Status Not Met      OT LONG TERM GOAL #5   Title Pt to tolerate compression wraps/ garments in keeping w/ prescribed daily wear regime within 1 week of issue date to progress and retain clinical and functional gains and to limit LE progression.    Baseline dependent    Time 12    Period Weeks    Status Achieved   met for LLE     OT LONG TERM GOAL #6   Title Pt to achieve at least 10% LLE limb volume reduction bilaterally during modified Intensive Phase CDT to reduce infection risk, risk of recurrent leg wounds, to improve functional performance of basic and instrumental ADLs, and to limit LE progression.    Baseline dependent    Time 12    Period Weeks    Status Partially Met   04/25/21 BLE clearly more swollen today; by visual assessment I estimate 15-20% increase on L and 10% increase on R from distal legs to toes.Volumetrics not taken today due to time constraints.   Target Date 06/13/21                   Plan - 05/23/21 1606     Clinical Impression Statement Limb swelling and skin condition continues to fluctuate between visits. Discussed this with Pt and nephew  in effort to troubleshoot and come up with a plan for improved consistency with compression.Pt tolerated MLD, skin care and re-application of LLE wraps today. Applied adjustable Velcro style wrap to RLE as established. Cont as per POC.    OT Occupational Profile and History Comprehensive Assessment- Review of records  and extensive additional review of physical, cognitive, psychosocial history related to current functional performance    Occupational Profile and client history currently impacting functional performance dimensia, inconsistent CG assistance with removing wraps, providing skin care and reapplying compression between clinical visits. S    Occupational performance deficits (Please refer to evaluation for details): ADL's;IADL's;Rest and Sleep;Play;Leisure;Social Participation;Other   body image   Body Structure / Function / Physical Skills ADL;Decreased knowledge of use of DME;Pain;Edema;Scar mobility;Decreased knowledge of precautions;Skin integrity    Rehab Potential Fair    Clinical Decision Making Multiple treatment options, significant modification of task necessary    Comorbidities Affecting Occupational Performance: Presence of comorbidities impacting occupational performance    Modification or Assistance to Complete Evaluation  Max significant modification of tasks or assist is necessary to complete    OT Frequency 1x / week    OT Duration 6 weeks   and PRN   OT Treatment/Interventions Self-care/ADL training;DME and/or AE instruction;Manual lymph drainage;Patient/family education;Compression bandaging;Therapeutic exercise;Manual Therapy;Other (comment)    Plan skin care to decrease infection risk and improve hydration    Recommended Other Services fit with adjustable , alternative compression wraps for optimal independence w/ donning and doffing. Cnsider CircAid with footpieces.    Consulted and Agree with Plan of Care Patient;Family member/caregiver             Patient will benefit from skilled therapeutic intervention in order to improve the following deficits and impairments:   Body Structure / Function / Physical Skills: ADL, Decreased knowledge of use of DME, Pain, Edema, Scar mobility, Decreased knowledge of precautions, Skin integrity       Visit Diagnosis: Lymphedema, not  elsewhere classified    Problem List Patient Active Problem List   Diagnosis Date Noted   PVD (peripheral vascular disease) ([place]) 04/27/2021   Medication noncompliance due to cognitive impairment  10/17/2019   Chronic pain of multiple joints 08/01/2017   Age-related osteoporosis without current pathological fracture 08/01/2017   Left knee pain 09/19/2016   Rectal benign neoplasm    Asthma, chronic 04/04/2015   history of Tubular adenoma of colon 04/04/2015   CKD (chronic kidney disease) stage 3, GFR 30-59 ml/min (HCC) 04/03/2015   Edema 00/71/2197   Chronic systolic heart failure ([place]) 11/20/2013   Lymphedema of lower extremity 10/17/2013   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[place]) 04/04/2013   MGUS (monoclonal gammopathy of unknown significance) 05/15/2012   Vitamin D deficiency 01/29/2010   DERMATITIS 09/30/2009   Paranoid delusion ([place]) 08/02/2008   Obstructive chronic bronchitis ([place]) 08/02/2008   Essential hypertension 11/11/2007   Venous (peripheral) insufficiency 11/11/2007   RENAL CALCULUS 11/11/2007   LOW BACK PAIN, CHRONIC 11/11/2007    Andrey Spearman, MS, OTR/L, CLT-LANA 05/23/21 4:12 PM   [place] MAIN [facility] SERVICES [address] [place], [place], [address] Phone: [phone]   Fax:  [phone]  Name: [name] MRN: [id number] Date of Birth: 07-12-1932

## 2021-05-23 NOTE — Patient Instructions (Signed)
Lymphedema Self- Care Instructions  1. EXERCISE: Perform lymphatic pumping there ex 2 x a day. While wearing your compression wraps or garments. Perform 10 reps of each exercise bilaterally and be sure to perform them in order. Don;t skip around!  OMIT PARTIAL SIT UPs.  2. MLD: Perform simple self-Manual Lymphatic Drainage (MLD) at least once a day as directed.  3. If you have a Flexitouch advanced "pump" use it 1 time each day on a single limb only. The Flexitouch moves lymphatic fluid out of your affected body part and back to your heart, so DO NOT use the Flexi on 2 legs at a time, and DO NOT ues it on 2 legs on the same day. If you experience any atypical shortness of breath, sudden onset of pain, or feelings of heart arhythmia, or racing, discontinue use of the Flexitouch and report these symptoms to your doctor right away.  4. WRAPS: Compression wraps are to be worn 23 hrs/ 7 days/wk during Intensive Phase of Complete Decongestive Therapy (CDT).Building tolerance may take time and practice, so don't get discouraged. If bandages begin to feel tight during periods of inactivity and/or during the night, try performing your exercises to loosen them.   5. GARMENTS: During Management Phase CDT your compression garments are to be worn during waking hours when active. Do NOT sleep in your garments!!   6. PUT YOUR FEET UP! Elevate your feet and legs and feet to the level of your heart whenever you are sitting down.   7. SKIN: Carefully monitor skin condition and perform impeccable hygiene daily. Bathe skin with mild soap and water and apply low pH lotion (aka Eucerin ) to improve hydration and limit infection risk.

## 2021-05-30 ENCOUNTER — Other Ambulatory Visit: Payer: Self-pay

## 2021-05-30 ENCOUNTER — Ambulatory Visit: Payer: Medicare Other | Attending: Family | Admitting: Occupational Therapy

## 2021-05-30 DIAGNOSIS — I890 Lymphedema, not elsewhere classified: Secondary | ICD-10-CM | POA: Insufficient documentation

## 2021-05-31 NOTE — Therapy (Signed)
[place] MAIN [facility] SERVICES [address] [place], [place], [address] Phone: [phone]   Fax:  [phone]  Occupational Therapy Treatment  Patient Details  Name: [name] MRN: [id number] Date of Birth: August 17, 1932 Referring Provider (OT): Sandrea Hughs, NP   Encounter Date: 05/30/2021   OT End of Session - 05/31/21 1347     Visit Number 34    Number of Visits 36    Date for OT Re-Evaluation 06/13/21    Authorization Time Period 09/28/20 - 01/03/21    OT Start Time 0100    OT Stop Time 0200    OT Time Calculation (min) 60 min    Activity Tolerance Patient tolerated treatment well;No increased pain    Behavior During Therapy WFL for tasks assessed/performed             Past Medical History:  Diagnosis Date   Anemia, unspecified    Anxiety    Arthritis    Asthma    Delusions ([place])    Depression    Edema 2005   "since put the glass in them"    GERD (gastroesophageal reflux disease)    Lumbago    Lymphedema    Obstructive chronic bronchitis without exacerbation ([place])    Other abnormal glucose    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[place]) 07/06/2835   Systolic heart failure ([place])    EF 35 to 40% per echo January 2015   Unspecified essential hypertension    Unspecified nonpsychotic mental disorder    Unspecified venous (peripheral) insufficiency    Unspecified venous (peripheral) insufficiency    Unspecified vitamin D deficiency     Past Surgical History:  Procedure Laterality Date   APPENDECTOMY     CERVICAL SPINE SURGERY     x 2   CHOLECYSTECTOMY     ESOPHAGOGASTRODUODENOSCOPY N/A 04/05/2015   Procedure: ESOPHAGOGASTRODUODENOSCOPY (EGD);  Surgeon: [name], MD;  Location: [facility];  Service: Endoscopy;  Laterality: N/A;   FLEXIBLE SIGMOIDOSCOPY N/A 04/05/2015   Procedure: FLEXIBLE SIGMOIDOSCOPY;  Surgeon: [name], MD;  Location: [facility];  Service: Endoscopy;  Laterality: N/A;   HAND SURGERY     LUMBAR DISC  SURGERY     PARTIAL HYSTERECTOMY     TONSILLECTOMY      There were no vitals filed for this visit.   Subjective Assessment - 05/31/21 1352     Subjective  [name] presents for OT Rx visit 34/36 to address BLE lymphedma. Pt is accompanied by her niece today, who reports they have been working on skin care for the past couple of days.    Pertinent History Chronic obstructive bronchitis, CVI, anxiety , systoloc heart failure, asthma, arthritis, multiple spinal surgeries w/ chronic back pain, DM,    Limitations Difficulty walking, impaired standing tolerance, impaired transfers, decreased joint AROM at toes, ankles and knees, chronic leg swelling , chronic pain. increased infection and falls risks, cognitive impairment, generaized weakness    Repetition Increases Symptoms    Special Tests + Stemmer bilaterally    Patient Stated Goals reduce swelling and legs feel better    Pain Onset Other (comment)                          OT Treatments/Exercises (OP) - 05/31/21 1353       ADLs   ADL Education Given Yes      Manual Therapy   Manual Therapy Edema management;Manual Lymphatic Drainage (MLD);Compression  Bandaging    Manual Lymphatic Drainage (MLD) MLD to LLE as established. Pants worn to clinic severly limit access to inguinal LN      for proximal MLD.    Compression Bandaging Multilayer, gradient compression wraps applied to LLE using 1 each of short stretch bandage 8, 10 and 12 cm wide over single layer of fRosidal oam and soft cotton stockinett. from base of toes to tibial tuberosity. CircAid adjustable knee length garment alternative applied to RLE using  ~ 30-40 mmHg.    Other Manual Therapy skin care to BLE with low ph castor oil concurrent w MLD                    OT Education - 05/31/21 1353     Education provided Yes    Education Details Continued Pt/ CG edu for lymphedema self care  and home program throughout session. Topics include  multilayer, gradient compression wrapping, simple self-MLD, therapeutic lymphatic pumping exercises, skin/nail care, risk reduction factors and LE precautions, compression garments/recommendations and wear and care schedule and compression garment donning / doffing using assistive devices. All questions answered to the Pt's satisfaction, and Pt demonstrates understanding by report.    Person(s) Educated Patient;Caregiver(s)    Methods Explanation;Demonstration    Comprehension Verbalized understanding;Returned demonstration;Need further instruction                 OT Long Term Goals - 04/25/21 1005       OT LONG TERM GOAL #1   Title With maximum caregiver assistance Pt will be able to apply knee length, multi-layer, short stretch compression wraps daily from ankle to tibial tuberosity on one leg at a time using correct gradient techniques to return affected limb/s, as closely as possible, to premorbid size and shape, to limit leg pain and infection risk, and to improve safe functional mobility and ADLs performance. 01/03/21: Manufacturer's rep coming next week to assist with fitting adjustable, Velcro style , knee length compression leg wraps. Hopefiullly Pt will be able to manage these more independently, and may be more do-able for caregivers. Drawback is they do not manage foot swelling well.    Baseline dependent    Time 4    Period Days    Status Achieved   DC goal.     OT LONG TERM GOAL #2   Title With Maximum caregiver assistance Pt will be able to verbalize signs and symptoms of cellulitis infection and identify lymphedema precautions using printed resource (modified independence) for reference to decrease infection risk and limit LE progression over time.    Baseline dependent    Time 6    Period Days    Status Achieved      OT LONG TERM GOAL #3   Title With Maximum caregiver assistance Pt >85 % compliant with all daily, LE self-care protocols for home program, including simple  self-manual lymphatic drainage (MLD), skin care, lymphatic pumping ther ex, skin care, and compression wraps and garments  to limit LE progression and further functional decline.    Baseline dependent    Time 12    Period Weeks    Status Not Met   DC goal. Pt lives alone and has no consistent CG assistance with home program components other than skin care.   Target Date 06/13/21      OT LONG TERM GOAL #4   Baseline dependent    Time 12    Period Weeks    Status Not Met  OT LONG TERM GOAL #5   Title Pt to tolerate compression wraps/ garments in keeping w/ prescribed daily wear regime within 1 week of issue date to progress and retain clinical and functional gains and to limit LE progression.    Baseline dependent    Time 12    Period Weeks    Status Achieved   met for LLE     OT LONG TERM GOAL #6   Title Pt to achieve at least 10% LLE limb volume reduction bilaterally during modified Intensive Phase CDT to reduce infection risk, risk of recurrent leg wounds, to improve functional performance of basic and instrumental ADLs, and to limit LE progression.    Baseline dependent    Time 12    Period Weeks    Status Partially Met   04/25/21 BLE clearly more swollen today; by visual assessment I estimate 15-20% increase on L and 10% increase on R from distal legs to toes.Volumetrics not taken today due to time constraints.   Target Date 06/13/21                   Plan - 05/31/21 1348     Clinical Impression Statement Limb swelling and skin condition continues to fluctuate widely between visits. Bilateral leg swelling is  mildly decreased  today with slight improvement in skin hydration as niece has been staying with patient and provides skin care when she visits. OT stressed importance of wearing compression daily as perscribed for more consistent control of limb swelling and skin condition.  Provided MLD and simultaneous skin care to LLE with good tolerance. Applied LLE gradient  compression wraps and CircAid adjustable garment alternative to the RLE as established. Cont as per POC in an effort to stabilized limb volume and limit skin breakdown and infection risk.    OT Occupational Profile and History Comprehensive Assessment- Review of records and extensive additional review of physical, cognitive, psychosocial history related to current functional performance    Occupational Profile and client history currently impacting functional performance dimensia, inconsistent CG assistance with removing wraps, providing skin care and reapplying compression between clinical visits. S    Occupational performance deficits (Please refer to evaluation for details): ADL's;IADL's;Rest and Sleep;Play;Leisure;Social Participation;Other   body image   Body Structure / Function / Physical Skills ADL;Decreased knowledge of use of DME;Pain;Edema;Scar mobility;Decreased knowledge of precautions;Skin integrity    Rehab Potential Fair    Clinical Decision Making Multiple treatment options, significant modification of task necessary    Comorbidities Affecting Occupational Performance: Presence of comorbidities impacting occupational performance    Modification or Assistance to Complete Evaluation  Max significant modification of tasks or assist is necessary to complete    OT Frequency 1x / week    OT Duration 6 weeks   and PRN   OT Treatment/Interventions Self-care/ADL training;DME and/or AE instruction;Manual lymph drainage;Patient/family education;Compression bandaging;Therapeutic exercise;Manual Therapy;Other (comment)    Plan skin care to decrease infection risk and improve hydration    Recommended Other Services fit with adjustable , alternative compression wraps for optimal independence w/ donning and doffing. Cnsider CircAid with footpieces.    Consulted and Agree with Plan of Care Patient;Family member/caregiver             Patient will benefit from skilled therapeutic intervention in  order to improve the following deficits and impairments:   Body Structure / Function / Physical Skills: ADL, Decreased knowledge of use of DME, Pain, Edema, Scar mobility, Decreased knowledge of precautions, Skin integrity  Visit Diagnosis: Lymphedema, not elsewhere classified    Problem List Patient Active Problem List   Diagnosis Date Noted   PVD (peripheral vascular disease) ([place]) 04/27/2021   Medication noncompliance due to cognitive impairment 10/17/2019   Chronic pain of multiple joints 08/01/2017   Age-related osteoporosis without current pathological fracture 08/01/2017   Left knee pain 09/19/2016   Rectal benign neoplasm    Asthma, chronic 04/04/2015   history of Tubular adenoma of colon 04/04/2015   CKD (chronic kidney disease) stage 3, GFR 30-59 ml/min (HCC) 04/03/2015   Edema 29/12/7953   Chronic systolic heart failure ([place]) 11/20/2013   Lymphedema of lower extremity 10/17/2013   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[place]) 04/04/2013   MGUS (monoclonal gammopathy of unknown significance) 05/15/2012   Vitamin D deficiency 01/29/2010   DERMATITIS 09/30/2009   Paranoid delusion ([place]) 08/02/2008   Obstructive chronic bronchitis ([place]) 08/02/2008   Essential hypertension 11/11/2007   Venous (peripheral) insufficiency 11/11/2007   RENAL CALCULUS 11/11/2007   LOW BACK PAIN, CHRONIC 11/11/2007    Andrey Spearman, MS, OTR/L, CLT-LANA 05/31/21 1:54 PM   [place] MAIN [facility] SERVICES [place], [place], [address] Phone: [phone]   Fax:  [phone]  Name: LAYSHA CHILDERS MRN: [id number] Date of Birth: 1932-10-29

## 2021-06-06 ENCOUNTER — Other Ambulatory Visit: Payer: Self-pay

## 2021-06-06 ENCOUNTER — Ambulatory Visit: Payer: Medicare Other | Admitting: Occupational Therapy

## 2021-06-06 DIAGNOSIS — I890 Lymphedema, not elsewhere classified: Secondary | ICD-10-CM

## 2021-06-08 NOTE — Patient Instructions (Signed)
Lymphedema Self- Care Instructions   1. EXERCISE: Perform lymphatic pumping there ex 2 x a day. While wearing your compression wraps or garments. Perform 10 reps of each exercise bilaterally and be sure to perform them in order. Don;t skip around!  OMIT PARTIAL SIT UPs.  2. MLD: Perform simple self-Manual Lymphatic Drainage (MLD) at least once a day as directed.  3. If you have a Flexitouch advanced "pump" use it 1 time each day on a single limb only. The Flexitouch moves lymphatic fluid out of your affected body part and back to your heart, so DO NOT use the Flexi on 2 legs at a time, and DO NOT ues it on 2 legs on the same day. If you experience any atypical shortness of breath, sudden onset of pain, or feelings of heart arhythmia, or racing, discontinue use of the Flexitouch and report these symptoms to your doctor right away.  4. WRAPS: Compression wraps are to be worn 23 hrs/ 7 days/wk during Intensive Phase of Complete Decongestive Therapy (CDT).Building tolerance may take time and practice, so don't get discouraged. If bandages begin to feel tight during periods of inactivity and/or during the night, try performing your exercises to loosen them.   5. Daytime GARMENTS/ HOS DEVICES: During Management Phase CDT your compression garments are to be worn during waking hours when active. Do NOT sleep in your garments!!  Don daytime garments first thing in the morning. Do not wear your HOS devices all day instead of garments. These are not made to be tight and will not contain your swelling like your stockings will.  6. PUT YOUR FEET UP! Elevate your feet and legs and feet to the level of your heart whenever you are sitting down.   7. SKIN: Carefully monitor skin condition and perform impeccable hygiene daily. Bathe skin with mild soap and water and apply low pH lotion (aka Eucerin ) to improve hydration and limit infection risk.

## 2021-06-08 NOTE — Therapy (Signed)
[place] MAIN [facility] [address] [place], [place], [address] Phone: [phone]   Fax:  [phone]  Occupational Therapy Treatment  Patient Details  Name: [name] MRN: [id number] Date of Birth: 05-Jul-1932 Referring Provider (OT): Sandrea Hughs, NP   Encounter Date: 06/06/2021   OT End of Session - 06/08/21 1245     Visit Number 35    Number of Visits 36    Date for OT Re-Evaluation 06/13/21    Authorization Time Period 09/28/20 - 01/03/21    Activity Tolerance Patient tolerated treatment well;No increased pain    Behavior During Therapy WFL for tasks assessed/performed             Past Medical History:  Diagnosis Date   Anemia, unspecified    Anxiety    Arthritis    Asthma    Delusions ([place])    Depression    Edema 2005   "since put the glass in them"    GERD (gastroesophageal reflux disease)    Lumbago    Lymphedema    Obstructive chronic bronchitis without exacerbation ([place])    Other abnormal glucose    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[place]) 07/05/1883   Systolic heart failure ([place])    EF 35 to 40% per echo January 2015   Unspecified essential hypertension    Unspecified nonpsychotic mental disorder    Unspecified venous (peripheral) insufficiency    Unspecified venous (peripheral) insufficiency    Unspecified vitamin D deficiency     Past Surgical History:  Procedure Laterality Date   APPENDECTOMY     CERVICAL SPINE SURGERY     x 2   CHOLECYSTECTOMY     ESOPHAGOGASTRODUODENOSCOPY N/A 04/05/2015   Procedure: ESOPHAGOGASTRODUODENOSCOPY (EGD);  Surgeon: [name], MD;  Location: [facility];  Service: Endoscopy;  Laterality: N/A;   FLEXIBLE SIGMOIDOSCOPY N/A 04/05/2015   Procedure: FLEXIBLE SIGMOIDOSCOPY;  Surgeon: [name], MD;  Location: [facility];  Service: Endoscopy;  Laterality: N/A;   HAND SURGERY     LUMBAR DISC SURGERY     PARTIAL HYSTERECTOMY     TONSILLECTOMY      There were no vitals  filed for this visit.   Subjective Assessment - 06/08/21 [place] presents for OT Rx visit 35/36 to address BLE lymphedma. Pt is accompanied by her nephew, [name],  today. Pt reports ongoing leg and back pain, but she does not rate pain numerically. Pt has no new complaints.    Pertinent History Chronic obstructive bronchitis, CVI, anxiety , systoloc heart failure, asthma, arthritis, multiple spinal surgeries w/ chronic back pain, DM,    Limitations Difficulty walking, impaired standing tolerance, impaired transfers, decreased joint AROM at toes, ankles and knees, chronic leg swelling , chronic pain. increased infection and falls risks, cognitive impairment, generaized weakness    Repetition Increases Symptoms    Special Tests + [place] bilaterally    Patient Stated Goals reduce swelling and legs feel better    Pain Onset Other (comment)                          OT Treatments/Exercises (OP) - 06/08/21 1246       ADLs   ADL Education Given Yes      Manual Therapy   Manual Therapy Edema management;Manual Lymphatic Drainage (MLD);Compression Bandaging    Manual Lymphatic Drainage (MLD) MLD to LLE as established. Pants worn to clinic  severly limit access to inguinal LN      for proximal MLD.    Compression Bandaging Multilayer, gradient compression wraps applied to LLE using 1 each of short stretch bandage 8, 10 and 12 cm wide over single layer of fRosidal oam and soft cotton stockinett. from base of toes to tibial tuberosity. CircAid adjustable knee length garment alternative applied to RLE using  ~ 30-40 mmHg.    Other Manual Therapy skin care to BLE with low ph castor oil concurrent w MLD                    OT Education - 06/08/21 1247     Education provided Yes    Education Details Continued Pt/ CG edu for lymphedema self care  and home program throughout session. Topics include multilayer, gradient compression wrapping, simple  self-MLD, therapeutic lymphatic pumping exercises, skin/nail care, risk reduction factors and LE precautions, compression garments/recommendations and wear and care schedule and compression garment donning / doffing using assistive devices. All questions answered to the Pt's satisfaction, and Pt demonstrates understanding by report.    Person(s) Educated Patient;Caregiver(s)    Methods Explanation;Demonstration    Comprehension Verbalized understanding;Returned demonstration;Need further instruction    Person(s) Educated Patient;Caregiver(s)    Methods Explanation;Demonstration    Comprehension Verbalized understanding;Returned demonstration;Need further instruction                 OT Long Term Goals - 04/25/21 1005       OT LONG TERM GOAL #1   Title With maximum caregiver assistance Pt will be able to apply knee length, multi-layer, short stretch compression wraps daily from ankle to tibial tuberosity on one leg at a time using correct gradient techniques to return affected limb/s, as closely as possible, to premorbid size and shape, to limit leg pain and infection risk, and to improve safe functional mobility and ADLs performance. 01/03/21: Manufacturer's rep coming next week to assist with fitting adjustable, Velcro style , knee length compression leg wraps. Hopefiullly Pt will be able to manage these more independently, and may be more do-able for caregivers. Drawback is they do not manage foot swelling well.    Baseline dependent    Time 4    Period Days    Status Achieved   DC goal.     OT LONG TERM GOAL #2   Title With Maximum caregiver assistance Pt will be able to verbalize signs and symptoms of cellulitis infection and identify lymphedema precautions using printed resource (modified independence) for reference to decrease infection risk and limit LE progression over time.    Baseline dependent    Time 6    Period Days    Status Achieved      OT LONG TERM GOAL #3   Title  With Maximum caregiver assistance Pt >85 % compliant with all daily, LE self-care protocols for home program, including simple self-manual lymphatic drainage (MLD), skin care, lymphatic pumping ther ex, skin care, and compression wraps and garments  to limit LE progression and further functional decline.    Baseline dependent    Time 12    Period Weeks    Status Not Met   DC goal. Pt lives alone and has no consistent CG assistance with home program components other than skin care.   Target Date 06/13/21      OT LONG TERM GOAL #4   Baseline dependent    Time 12    Period Weeks    Status Not  Met      OT LONG TERM GOAL #5   Title Pt to tolerate compression wraps/ garments in keeping w/ prescribed daily wear regime within 1 week of issue date to progress and retain clinical and functional gains and to limit LE progression.    Baseline dependent    Time 12    Period Weeks    Status Achieved   met for LLE     OT LONG TERM GOAL #6   Title Pt to achieve at least 10% LLE limb volume reduction bilaterally during modified Intensive Phase CDT to reduce infection risk, risk of recurrent leg wounds, to improve functional performance of basic and instrumental ADLs, and to limit LE progression.    Baseline dependent    Time 12    Period Weeks    Status Partially Met   04/25/21 BLE clearly more swollen today; by visual assessment I estimate 15-20% increase on L and 10% increase on R from distal legs to toes.Volumetrics not taken today due to time constraints.   Target Date 06/13/21                   Plan - 06/08/21 1245     Clinical Impression Statement Continued MLD, skin care and gradient compression to LLE below the knee. Applied compression bandages to LL and alternative  CircAid wraps to RLE without increased pain. Skin on dorsal feet is peeling and cracked, as has been typical for this patient over time. Signs/ symptoms of infection are absent. Cont to support patient for skilled  lymphedema care to limit infection risk and progression of this chronic condition. Without ongoing OT support condition will worsen and further functional decline is expected.    OT Occupational Profile and History Comprehensive Assessment- Review of records and extensive additional review of physical, cognitive, psychosocial history related to current functional performance    Occupational Profile and client history currently impacting functional performance dimensia, inconsistent CG assistance with removing wraps, providing skin care and reapplying compression between clinical visits. S    Occupational performance deficits (Please refer to evaluation for details): ADL's;IADL's;Rest and Sleep;Play;Leisure;Social Participation;Other   body image   Body Structure / Function / Physical Skills ADL;Decreased knowledge of use of DME;Pain;Edema;Scar mobility;Decreased knowledge of precautions;Skin integrity    Rehab Potential Fair    Clinical Decision Making Multiple treatment options, significant modification of task necessary    Comorbidities Affecting Occupational Performance: Presence of comorbidities impacting occupational performance    Modification or Assistance to Complete Evaluation  Max significant modification of tasks or assist is necessary to complete    OT Frequency 1x / week    OT Duration 6 weeks   and PRN   OT Treatment/Interventions Self-care/ADL training;DME and/or AE instruction;Manual lymph drainage;Patient/family education;Compression bandaging;Therapeutic exercise;Manual Therapy;Other (comment)    Plan skin care to decrease infection risk and improve hydration    Recommended Other Services fit with adjustable , alternative compression wraps for optimal independence w/ donning and doffing. Cnsider CircAid with footpieces.    Consulted and Agree with Plan of Care Patient;Family member/caregiver             Patient will benefit from skilled therapeutic intervention in order to  improve the following deficits and impairments:   Body Structure / Function / Physical Skills: ADL, Decreased knowledge of use of DME, Pain, Edema, Scar mobility, Decreased knowledge of precautions, Skin integrity       Visit Diagnosis: Lymphedema, not elsewhere classified    Problem List Patient  Active Problem List   Diagnosis Date Noted   PVD (peripheral vascular disease) ([place]) 04/27/2021   Medication noncompliance due to cognitive impairment 10/17/2019   Chronic pain of multiple joints 08/01/2017   Age-related osteoporosis without current pathological fracture 08/01/2017   Left knee pain 09/19/2016   Rectal benign neoplasm    Asthma, chronic 04/04/2015   history of Tubular adenoma of colon 04/04/2015   CKD (chronic kidney disease) stage 3, GFR 30-59 ml/min ([facility]) 04/03/2015   Edema 78/47/8412   Chronic systolic heart failure ([place]) 11/20/2013   Lymphedema of lower extremity 10/17/2013   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[place]) 04/04/2013   MGUS (monoclonal gammopathy of unknown significance) 05/15/2012   Vitamin D deficiency 01/29/2010   DERMATITIS 09/30/2009   Paranoid delusion ([place]) 08/02/2008   Obstructive chronic bronchitis ([place]) 08/02/2008   Essential hypertension 11/11/2007   Venous (peripheral) insufficiency 11/11/2007   RENAL CALCULUS 11/11/2007   LOW BACK PAIN, CHRONIC 11/11/2007   Andrey Spearman, MS, OTR/L, CLT-LANA 06/08/21 12:48 PM   [place] MAIN [facility] SERVICES [address] [place], [place], [address] Phone: [phone]   Fax:  [phone]  Name: [name] MRN: [id number] Date of Birth: [birth year]

## 2021-06-14 ENCOUNTER — Ambulatory Visit: Payer: Medicare Other | Admitting: Occupational Therapy

## 2021-06-14 ENCOUNTER — Other Ambulatory Visit: Payer: Self-pay

## 2021-06-14 DIAGNOSIS — I890 Lymphedema, not elsewhere classified: Secondary | ICD-10-CM | POA: Diagnosis not present

## 2021-06-14 NOTE — Therapy (Signed)
[place] MAIN [facility] SERVICES [address] [place], [place], [address] Phone: [phone]   Fax:  [phone]  Occupational Therapy Treatment  Patient Details  Name: [name] MRN: [id number] Date of Birth: Nov 01, 1931 Referring Provider (OT): Sophia Hughs, NP   Encounter Date: 06/14/2021   OT End of Session - 06/14/21 1525     Visit Number 36    Number of Visits 72    Date for OT Re-Evaluation 09/12/21    Authorization Time Period 09/28/20 - 01/03/21    OT Start Time 0315    OT Stop Time 0420    OT Time Calculation (min) 65 min    Activity Tolerance Patient tolerated treatment well;No increased pain    Behavior During Therapy WFL for tasks assessed/performed             Past Medical History:  Diagnosis Date   Anemia, unspecified    Anxiety    Arthritis    Asthma    Delusions ([place])    Depression    Edema 2005   "since put the glass in them"    GERD (gastroesophageal reflux disease)    Lumbago    Lymphedema    Obstructive chronic bronchitis without exacerbation ([place])    Other abnormal glucose    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[place]) 0/10/7791   Systolic heart failure ([place])    EF 35 to 40% per echo January 2015   Unspecified essential hypertension    Unspecified nonpsychotic mental disorder    Unspecified venous (peripheral) insufficiency    Unspecified venous (peripheral) insufficiency    Unspecified vitamin D deficiency     Past Surgical History:  Procedure Laterality Date   APPENDECTOMY     CERVICAL SPINE SURGERY     x 2   CHOLECYSTECTOMY     ESOPHAGOGASTRODUODENOSCOPY N/A 04/05/2015   Procedure: ESOPHAGOGASTRODUODENOSCOPY (EGD);  Surgeon: [name], MD;  Location: [facility];  Service: Endoscopy;  Laterality: N/A;   FLEXIBLE SIGMOIDOSCOPY N/A 04/05/2015   Procedure: FLEXIBLE SIGMOIDOSCOPY;  Surgeon: [name], MD;  Location: [facility];  Service: Endoscopy;  Laterality: N/A;   HAND SURGERY     LUMBAR DISC  SURGERY     PARTIAL HYSTERECTOMY     TONSILLECTOMY      There were no vitals filed for this visit.   Subjective Assessment - 06/14/21 1526     Subjective  [name] presents for OT Rx visit 36/36 to address BLE lymphedma. Pt is accompanied by her nephew, [name],  today. Pt reports 5/10 leg pain this afternoon. Today is Pt's last visit of the initial 36 visit POC. She will benefit from continuing 1 x weekly LE care to limit proigression and infection risk.    Pertinent History Chronic obstructive bronchitis, CVI, anxiety , systoloc heart failure, asthma, arthritis, multiple spinal surgeries w/ chronic back pain, DM,    Limitations Difficulty walking, impaired standing tolerance, impaired transfers, decreased joint AROM at toes, ankles and knees, chronic leg swelling , chronic pain. increased infection and falls risks, cognitive impairment, generaized weakness    Repetition Increases Symptoms    Special Tests + Stemmer bilaterally    Patient Stated Goals reduce swelling and legs feel better    Pain Onset Other (comment)                          OT Treatments/Exercises (OP) - 06/14/21 1656       ADLs  ADL Education Given Yes      Manual Therapy   Manual Therapy Edema management;Manual Lymphatic Drainage (MLD);Compression Bandaging    Manual Lymphatic Drainage (MLD) MLD to LLE as established. Pants worn to clinic severly limit access to inguinal LN      for proximal MLD.    Compression Bandaging Multilayer, gradient compression wraps applied to LLE using 1 each of short stretch bandage 8, 10 and 12 cm wide over single layer of fRosidal oam and soft cotton stockinett. from base of toes to tibial tuberosity. CircAid adjustable knee length garment alternative applied to RLE using  ~ 30-40 mmHg.    Other Manual Therapy skin care to BLE with low ph castor oil concurrent w MLD                    OT Education - 06/14/21 1656     Education provided Yes     Education Details Continued Pt/ CG edu for lymphedema self care  and home program throughout session. Topics include multilayer, gradient compression wrapping, simple self-MLD, therapeutic lymphatic pumping exercises, skin/nail care, risk reduction factors and LE precautions, compression garments/recommendations and wear and care schedule and compression garment donning / doffing using assistive devices. All questions answered to the Pt's satisfaction, and Pt demonstrates understanding by report.    Person(s) Educated Patient;Caregiver(s)    Methods Explanation;Demonstration    Comprehension Verbalized understanding;Returned demonstration;Need further instruction                 OT Long Term Goals - 04/25/21 1005       OT LONG TERM GOAL #1   Title With maximum caregiver assistance Pt will be able to apply knee length, multi-layer, short stretch compression wraps daily from ankle to tibial tuberosity on one leg at a time using correct gradient techniques to return affected limb/s, as closely as possible, to premorbid size and shape, to limit leg pain and infection risk, and to improve safe functional mobility and ADLs performance. 01/03/21: Manufacturer's rep coming next week to assist with fitting adjustable, Velcro style , knee length compression leg wraps. Hopefiullly Pt will be able to manage these more independently, and may be more do-able for caregivers. Drawback is they do not manage foot swelling well.    Baseline dependent    Time 4    Period Days    Status Achieved   DC goal.     OT LONG TERM GOAL #2   Title With Maximum caregiver assistance Pt will be able to verbalize signs and symptoms of cellulitis infection and identify lymphedema precautions using printed resource (modified independence) for reference to decrease infection risk and limit LE progression over time.    Baseline dependent    Time 6    Period Days    Status Achieved      OT LONG TERM GOAL #3   Title With  Maximum caregiver assistance Pt >85 % compliant with all daily, LE self-care protocols for home program, including simple self-manual lymphatic drainage (MLD), skin care, lymphatic pumping ther ex, skin care, and compression wraps and garments  to limit LE progression and further functional decline.    Baseline dependent    Time 12    Period Weeks    Status Not Met   DC goal. Pt lives alone and has no consistent CG assistance with home program components other than skin care.   Target Date 06/13/21      OT LONG TERM GOAL #4  Baseline dependent    Time 12    Period Weeks    Status Not Met      OT LONG TERM GOAL #5   Title Pt to tolerate compression wraps/ garments in keeping w/ prescribed daily wear regime within 1 week of issue date to progress and retain clinical and functional gains and to limit LE progression.    Baseline dependent    Time 12    Period Weeks    Status Achieved   met for LLE     OT LONG TERM GOAL #6   Title Pt to achieve at least 10% LLE limb volume reduction bilaterally during modified Intensive Phase CDT to reduce infection risk, risk of recurrent leg wounds, to improve functional performance of basic and instrumental ADLs, and to limit LE progression.    Baseline dependent    Time 12    Period Weeks    Status Partially Met   04/25/21 BLE clearly more swollen today; by visual assessment I estimate 15-20% increase on L and 10% increase on R from distal legs to toes.Volumetrics not taken today due to time constraints.   Target Date 06/13/21                   Plan - 06/14/21 1658     Clinical Impression Statement Continued MLD, skin care and gradient compression to LLE below the knee. Applied compression bandages to LL and alternative  CircAid wraps to RLE without increased pain. Skin on dorsal feet is peeling and cracked, as has been typical for this patient over time. Signs/ symptoms of infection are absent. Cont to support patient for skilled lymphedema  care to limit infection risk and progression of this chronic condition. Without ongoing OT support condition will worsen and further functional decline is expected. Today is Pt's last visit of the initial 36 visit POC. She will benefit from continuing 1 x weekly LE care , 36/72 visits , to limit progression and infection risk.    OT Occupational Profile and History Comprehensive Assessment- Review of records and extensive additional review of physical, cognitive, psychosocial history related to current functional performance    Occupational Profile and client history currently impacting functional performance dimensia, inconsistent CG assistance with removing wraps, providing skin care and reapplying compression between clinical visits. S    Occupational performance deficits (Please refer to evaluation for details): ADL's;IADL's;Rest and Sleep;Play;Leisure;Social Participation;Other   body image   Body Structure / Function / Physical Skills ADL;Decreased knowledge of use of DME;Pain;Edema;Scar mobility;Decreased knowledge of precautions;Skin integrity    Rehab Potential Fair    Clinical Decision Making Multiple treatment options, significant modification of task necessary    Comorbidities Affecting Occupational Performance: Presence of comorbidities impacting occupational performance    Modification or Assistance to Complete Evaluation  Max significant modification of tasks or assist is necessary to complete    OT Frequency 1x / week    OT Duration 6 weeks   and PRN   OT Treatment/Interventions Self-care/ADL training;DME and/or AE instruction;Manual lymph drainage;Patient/family education;Compression bandaging;Therapeutic exercise;Manual Therapy;Other (comment)    Plan skin care to decrease infection risk and improve hydration    Recommended Other Services fit with adjustable , alternative compression wraps for optimal independence w/ donning and doffing. Cnsider CircAid with footpieces.    Consulted  and Agree with Plan of Care Patient;Family member/caregiver             Patient will benefit from skilled therapeutic intervention in order to improve the  following deficits and impairments:   Body Structure / Function / Physical Skills: ADL, Decreased knowledge of use of DME, Pain, Edema, Scar mobility, Decreased knowledge of precautions, Skin integrity       Visit Diagnosis: Lymphedema, not elsewhere classified    Problem List Patient Active Problem List   Diagnosis Date Noted   PVD (peripheral vascular disease) ([place]) 04/27/2021   Medication noncompliance due to cognitive impairment 10/17/2019   Chronic pain of multiple joints 08/01/2017   Age-related osteoporosis without current pathological fracture 08/01/2017   Left knee pain 09/19/2016   Rectal benign neoplasm    Asthma, chronic 04/04/2015   history of Tubular adenoma of colon 04/04/2015   CKD (chronic kidney disease) stage 3, GFR 30-59 ml/min ([facility]) 04/03/2015   Edema 30/04/6225   Chronic systolic heart failure ([place]) 11/20/2013   Lymphedema of lower extremity 10/17/2013   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[place]) 04/04/2013   MGUS (monoclonal gammopathy of unknown significance) 05/15/2012   Vitamin D deficiency 01/29/2010   DERMATITIS 09/30/2009   Paranoid delusion ([place]) 08/02/2008   Obstructive chronic bronchitis ([place]) 08/02/2008   Essential hypertension 11/11/2007   Venous (peripheral) insufficiency 11/11/2007   RENAL CALCULUS 11/11/2007   LOW BACK PAIN, CHRONIC 11/11/2007   Andrey Spearman, MS, OTR/L, CLT-LANA 06/14/21 5:00 PM    [place] [address] [place], [place], [address] Phone: [phone]   Fax:  [phone]  Name: [name] MRN: [id number] Date of Birth: 09/24/1932

## 2021-06-14 NOTE — Patient Instructions (Signed)
Lymphedema Self- Care Instructions   1. EXERCISE: Perform lymphatic pumping there ex 2 x a day. While wearing your compression wraps or garments. Perform 10 reps of each exercise bilaterally and be sure to perform them in order. Don;t skip around!  OMIT PARTIAL SIT UPs.  2. MLD: Perform simple self-Manual Lymphatic Drainage (MLD) at least once a day as directed.  3. If you have a Flexitouch advanced "pump" use it 1 time each day on a single limb only. The Flexitouch moves lymphatic fluid out of your affected body part and back to your heart, so DO NOT use the Flexi on 2 legs at a time, and DO NOT ues it on 2 legs on the same day. If you experience any atypical shortness of breath, sudden onset of pain, or feelings of heart arhythmia, or racing, discontinue use of the Flexitouch and report these symptoms to your doctor right away.  4. 4. WRAPS: Compression wraps are to be worn 23 hrs/ 7 days/wk during Intensive Phase of Complete Decongestive Therapy (CDT).Building tolerance may take time and practice, so don't get discouraged. If bandages begin to feel tight during periods of inactivity and/or during the night, try performing your exercises to loosen them. Do not leave short stretch wraps in place for > 23 hours. It is very important that you remove all wraps daily to inspect skin, bathe and perform skin care before reapplying multi-layer, short stretch gradient compression wraps.  5. Daytime GARMENTS/ HOS DEVICES: During Management Phase CDT your compression garments are to be worn during waking hours when active. Do NOT sleep in your garments!!  Don daytime garments first thing in the morning. Do not wear your HOS devices all day instead of garments. These are not made to be tight and will not contain your swelling like your stockings will.  6. PUT YOUR FEET UP! Elevate your feet and legs and feet to the level of your heart whenever you are sitting down.   7. SKIN: Carefully monitor skin condition  and perform impeccable hygiene daily. Bathe skin with mild soap and water and apply low pH lotion (aka Eucerin ) to improve hydration and limit infection risk.

## 2021-06-20 ENCOUNTER — Other Ambulatory Visit: Payer: Self-pay

## 2021-06-20 ENCOUNTER — Ambulatory Visit: Payer: Medicare Other | Admitting: Occupational Therapy

## 2021-06-20 DIAGNOSIS — I890 Lymphedema, not elsewhere classified: Secondary | ICD-10-CM

## 2021-06-21 NOTE — Patient Instructions (Signed)

## 2021-06-21 NOTE — Therapy (Signed)
[place] MAIN [facility] SERVICES [address] [place], [place], [address] Phone: [phone]   Fax:  [phone]  Occupational Therapy Treatment  Patient Details  Name: [name] MRN: [id number] Date of Birth: Jul 14, 1932 Referring Provider (OT): Sophia Hughs, NP   Encounter Date: 06/20/2021   OT End of Session - 06/20/21 1618     Visit Number 37    Number of Visits 72    Date for OT Re-Evaluation 09/12/21    Authorization Time Period 09/28/20 - 01/03/21    OT Start Time 0245    OT Stop Time 0405    OT Time Calculation (min) 80 min    Activity Tolerance Patient tolerated treatment well;No increased pain    Behavior During Therapy WFL for tasks assessed/performed             Past Medical History:  Diagnosis Date   Anemia, unspecified    Anxiety    Arthritis    Asthma    Delusions ([place])    Depression    Edema 2005   "since put the glass in them"    GERD (gastroesophageal reflux disease)    Lumbago    Lymphedema    Obstructive chronic bronchitis without exacerbation ([place])    Other abnormal glucose    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[place]) 06/02/4195   Systolic heart failure ([place])    EF 35 to 40% per echo January 2015   Unspecified essential hypertension    Unspecified nonpsychotic mental disorder    Unspecified venous (peripheral) insufficiency    Unspecified venous (peripheral) insufficiency    Unspecified vitamin D deficiency     Past Surgical History:  Procedure Laterality Date   APPENDECTOMY     CERVICAL SPINE SURGERY     x 2   CHOLECYSTECTOMY     ESOPHAGOGASTRODUODENOSCOPY N/A 04/05/2015   Procedure: ESOPHAGOGASTRODUODENOSCOPY (EGD);  Surgeon: [name], MD;  Location: [facility];  Service: Endoscopy;  Laterality: N/A;   FLEXIBLE SIGMOIDOSCOPY N/A 04/05/2015   Procedure: FLEXIBLE SIGMOIDOSCOPY;  Surgeon: [name], MD;  Location: [facility];  Service: Endoscopy;  Laterality: N/A;   HAND SURGERY     LUMBAR DISC  SURGERY     PARTIAL HYSTERECTOMY     TONSILLECTOMY      There were no vitals filed for this visit.   Subjective Assessment - 06/21/21 1245     Subjective  [name] presents for OT Rx visit 37/72 to address BLE lymphedma. Pt is accompanied by her nephew, [name],  today. Pt reports 5/10 leg pain this afternoon. Pt presents without wraps or compression garments in place.    Pertinent History Chronic obstructive bronchitis, CVI, anxiety , systoloc heart failure, asthma, arthritis, multiple spinal surgeries w/ chronic back pain, DM,    Limitations Difficulty walking, impaired standing tolerance, impaired transfers, decreased joint AROM at toes, ankles and knees, chronic leg swelling , chronic pain. increased infection and falls risks, cognitive impairment, generaized weakness    Repetition Increases Symptoms    Special Tests + Stemmer bilaterally    Patient Stated Goals reduce swelling and legs feel better    Pain Onset Other (comment)                          OT Treatments/Exercises (OP) - 06/21/21 1246       ADLs   ADL Education Given Yes      Manual Therapy   Manual Therapy Edema management;Manual  Lymphatic Drainage (MLD);Compression Bandaging    Manual Lymphatic Drainage (MLD) MLD to LLE as established. Pants worn to clinic severly limit access to inguinal LN      for proximal MLD.    Compression Bandaging Multilayer, gradient compression wraps applied to LLE using 1 each of short stretch bandage 8, 10 and 12 cm wide over single layer of fRosidal oam and soft cotton stockinett. from base of toes to tibial tuberosity. CircAid adjustable knee length garment alternative applied to RLE using  ~ 30-40 mmHg.    Other Manual Therapy skin care to BLE with low ph castor oil concurrent w MLD                    OT Education - 06/21/21 1248     Education provided Yes    Education Details Continued Pt/ CG edu for lymphedema self care  and home program throughout  session. Topics include multilayer, gradient compression wrapping, simple self-MLD, therapeutic lymphatic pumping exercises, skin/nail care, risk reduction factors and LE precautions, compression garments/recommendations and wear and care schedule and compression garment donning / doffing using assistive devices. All questions answered to the Pt's satisfaction, and Pt demonstrates understanding by report.    Person(s) Educated Patient;Caregiver(s)    Methods Explanation;Demonstration    Comprehension Verbalized understanding;Returned demonstration;Need further instruction                 OT Long Term Goals - 04/25/21 1005       OT LONG TERM GOAL #1   Title With maximum caregiver assistance Pt will be able to apply knee length, multi-layer, short stretch compression wraps daily from ankle to tibial tuberosity on one leg at a time using correct gradient techniques to return affected limb/s, as closely as possible, to premorbid size and shape, to limit leg pain and infection risk, and to improve safe functional mobility and ADLs performance. 01/03/21: Manufacturer's rep coming next week to assist with fitting adjustable, Velcro style , knee length compression leg wraps. Hopefiullly Pt will be able to manage these more independently, and may be more do-able for caregivers. Drawback is they do not manage foot swelling well.    Baseline dependent    Time 4    Period Days    Status Achieved   DC goal.     OT LONG TERM GOAL #2   Title With Maximum caregiver assistance Pt will be able to verbalize signs and symptoms of cellulitis infection and identify lymphedema precautions using printed resource (modified independence) for reference to decrease infection risk and limit LE progression over time.    Baseline dependent    Time 6    Period Days    Status Achieved      OT LONG TERM GOAL #3   Title With Maximum caregiver assistance Pt >85 % compliant with all daily, LE self-care protocols for home  program, including simple self-manual lymphatic drainage (MLD), skin care, lymphatic pumping ther ex, skin care, and compression wraps and garments  to limit LE progression and further functional decline.    Baseline dependent    Time 12    Period Weeks    Status Not Met   DC goal. Pt lives alone and has no consistent CG assistance with home program components other than skin care.   Target Date 06/13/21      OT LONG TERM GOAL #4   Baseline dependent    Time 12    Period Weeks    Status Not  Met      OT LONG TERM GOAL #5   Title Pt to tolerate compression wraps/ garments in keeping w/ prescribed daily wear regime within 1 week of issue date to progress and retain clinical and functional gains and to limit LE progression.    Baseline dependent    Time 12    Period Weeks    Status Achieved   met for LLE     OT LONG TERM GOAL #6   Title Pt to achieve at least 10% LLE limb volume reduction bilaterally during modified Intensive Phase CDT to reduce infection risk, risk of recurrent leg wounds, to improve functional performance of basic and instrumental ADLs, and to limit LE progression.    Baseline dependent    Time 12    Period Weeks    Status Partially Met   04/25/21 BLE clearly more swollen today; by visual assessment I estimate 15-20% increase on L and 10% increase on R from distal legs to toes.Volumetrics not taken today due to time constraints.   Target Date 06/13/21                   Plan - 06/20/21 1224     Clinical Impression Statement Ms Bencosme presents with signifcantly increase in LLE swelling below the knee today. RLE is also more swollen than last visit, but it  is considerably less swollen than the LLE.  Skin of RLE is very cold to the touch from mid leg distally to foot circumferentially. Pt denies increased pain in the limb. Large, thickened  sheets of skin are peeling and flaking off of feet  with very shiny skin beneath with very mild superficial hemoraging and  some areas of deep verticle skin plitting  observed. Pt has been experiencing ongoing significant fluctuations in leg edema, but I have never  palpated such extreme difference in skin temperature at distal L leg. Pt and nephew encouraged gto report these symptoms to Ms. Hejl's  PCP. Pt and nephew educated yet again of the importance of appropriate, daily compression  and impecable skin care. It is obvious that compression is not worn consistently, perhaps after OT visits at best. Pt and nephew educated that this must change to limit ongoing worsening of condition. Nephew reports that compression wear daily has been minimal at best and paid caregiver is unreliable . Family is working to remedy this. Today Pt tolerated very  gentle MLD and compression wraps as estqblished. Withut increase in compression clompliance this Pt's condition will continue to worsen and further functional decline is inevitable. Family verbalizs understanding.    OT Occupational Profile and History Comprehensive Assessment- Review of records and extensive additional review of physical, cognitive, psychosocial history related to current functional performance    Occupational Profile and client history currently impacting functional performance dimensia, inconsistent CG assistance with removing wraps, providing skin care and reapplying compression between clinical visits. S    Occupational performance deficits (Please refer to evaluation for details): ADL's;IADL's;Rest and Sleep;Play;Leisure;Social Participation;Other   body image   Body Structure / Function / Physical Skills ADL;Decreased knowledge of use of DME;Pain;Edema;Scar mobility;Decreased knowledge of precautions;Skin integrity    Rehab Potential Fair    Clinical Decision Making Multiple treatment options, significant modification of task necessary    Comorbidities Affecting Occupational Performance: Presence of comorbidities impacting occupational performance    Modification  or Assistance to Complete Evaluation  Max significant modification of tasks or assist is necessary to complete    OT Frequency 1x /  week    OT Duration 6 weeks   and PRN   OT Treatment/Interventions Self-care/ADL training;DME and/or AE instruction;Manual lymph drainage;Patient/family education;Compression bandaging;Therapeutic exercise;Manual Therapy;Other (comment)    Plan skin care to decrease infection risk and improve hydration    Recommended Other Services fit with adjustable , alternative compression wraps for optimal independence w/ donning and doffing. Cnsider CircAid with footpieces.    Consulted and Agree with Plan of Care Patient;Family member/caregiver             Patient will benefit from skilled therapeutic intervention in order to improve the following deficits and impairments:   Body Structure / Function / Physical Skills: ADL, Decreased knowledge of use of DME, Pain, Edema, Scar mobility, Decreased knowledge of precautions, Skin integrity       Visit Diagnosis: Lymphedema, not elsewhere classified    Problem List Patient Active Problem List   Diagnosis Date Noted   PVD (peripheral vascular disease) ([place]) 04/27/2021   Medication noncompliance due to cognitive impairment 10/17/2019   Chronic pain of multiple joints 08/01/2017   Age-related osteoporosis without current pathological fracture 08/01/2017   Left knee pain 09/19/2016   Rectal benign neoplasm    Asthma, chronic 04/04/2015   history of Tubular adenoma of colon 04/04/2015   CKD (chronic kidney disease) stage 3, GFR 30-59 ml/min (HCC) 04/03/2015   Edema 45/80/9983   Chronic systolic heart failure ([place]) 11/20/2013   Lymphedema of lower extremity 10/17/2013   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 [place], [place], [address] Phone: [phone]   Fax:  [phone]  Name: [name] MRN: [id number] Date of Birth: [birth year]

## 2021-06-28 ENCOUNTER — Ambulatory Visit: Payer: Medicare Other | Admitting: Occupational Therapy

## 2021-06-28 ENCOUNTER — Other Ambulatory Visit: Payer: Self-pay

## 2021-06-28 DIAGNOSIS — I890 Lymphedema, not elsewhere classified: Secondary | ICD-10-CM | POA: Diagnosis not present

## 2021-06-28 NOTE — Therapy (Signed)
[place] MAIN [facility] SERVICES [address] [place], [place], [address] Phone: [phone]   Fax:  [phone]  Occupational Therapy Treatment  Patient Details  Name: [name] MRN: [id number] Date of Birth: May 15, 1932 Referring Provider (OT): Sandrea Hughs, NP   Encounter Date: 06/28/2021   OT End of Session - 06/28/21 1517     Visit Number 38    Number of Visits 72    Date for OT Re-Evaluation 09/12/21    Authorization Time Period 09/28/20 - 01/03/21    OT Start Time 0300    OT Stop Time 0410    OT Time Calculation (min) 70 min    Activity Tolerance Patient tolerated treatment well;No increased pain    Behavior During Therapy WFL for tasks assessed/performed             Past Medical History:  Diagnosis Date   Anemia, unspecified    Anxiety    Arthritis    Asthma    Delusions ([place])    Depression    Edema 2005   "since put the glass in them"    GERD (gastroesophageal reflux disease)    Lumbago    Lymphedema    Obstructive chronic bronchitis without exacerbation ([place])    Other abnormal glucose    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[place]) 0/12/7739   Systolic heart failure ([place])    EF 35 to 40% per echo January 2015   Unspecified essential hypertension    Unspecified nonpsychotic mental disorder    Unspecified venous (peripheral) insufficiency    Unspecified venous (peripheral) insufficiency    Unspecified vitamin D deficiency     Past Surgical History:  Procedure Laterality Date   APPENDECTOMY     CERVICAL SPINE SURGERY     x 2   CHOLECYSTECTOMY     ESOPHAGOGASTRODUODENOSCOPY N/A 04/05/2015   Procedure: ESOPHAGOGASTRODUODENOSCOPY (EGD);  Surgeon: [name], MD;  Location: [facility];  Service: Endoscopy;  Laterality: N/A;   FLEXIBLE SIGMOIDOSCOPY N/A 04/05/2015   Procedure: FLEXIBLE SIGMOIDOSCOPY;  Surgeon: [name], MD;  Location: [facility];  Service: Endoscopy;  Laterality: N/A;   HAND SURGERY     LUMBAR DISC  SURGERY     PARTIAL HYSTERECTOMY     TONSILLECTOMY      There were no vitals filed for this visit.   Subjective Assessment - 06/28/21 1518     Subjective  [name] presents for OT Rx visit 38/72 to address BLE lymphedma. Pt is accompanied by her nephew, [name],  today. Pt reports 5/10 leg pain this afternoon. Pt presents without wraps or compression garments in place.    Pertinent History Chronic obstructive bronchitis, CVI, anxiety , systoloc heart failure, asthma, arthritis, multiple spinal surgeries w/ chronic back pain, DM,    Limitations Difficulty walking, impaired standing tolerance, impaired transfers, decreased joint AROM at toes, ankles and knees, chronic leg swelling , chronic pain. increased infection and falls risks, cognitive impairment, generaized weakness    Repetition Increases Symptoms    Special Tests + Stemmer bilaterally    Patient Stated Goals reduce swelling and legs feel better    Pain Onset Other (comment)                          OT Treatments/Exercises (OP) - 06/28/21 1614       ADLs   ADL Education Given Yes      Manual Therapy   Manual Therapy Edema management;Manual  Lymphatic Drainage (MLD);Compression Bandaging    Manual Lymphatic Drainage (MLD) MLD to LLE as established. Pants worn to clinic severly limit access to inguinal LN      for proximal MLD.    Compression Bandaging Multilayer, gradient compression wraps applied to LLE using 1 each of short stretch bandage 8, 10 and 12 cm wide over single layer of fRosidal oam and soft cotton stockinett. from base of toes to tibial tuberosity. CircAid adjustable knee length garment alternative applied to RLE using  ~ 30-40 mmHg.    Other Manual Therapy skin care to BLE with low ph castor oil concurrent w MLD                    OT Education - 06/28/21 1614     Education provided Yes    Education Details Continued Pt/ CG edu for lymphedema self care  and home program throughout  session. Topics include multilayer, gradient compression wrapping, simple self-MLD, therapeutic lymphatic pumping exercises, skin/nail care, risk reduction factors and LE precautions, compression garments/recommendations and wear and care schedule and compression garment donning / doffing using assistive devices. All questions answered to the Pt's satisfaction, and Pt demonstrates understanding by report.    Person(s) Educated Patient;Caregiver(s)    Methods Explanation;Demonstration    Comprehension Verbalized understanding;Returned demonstration;Need further instruction                 OT Long Term Goals - 04/25/21 1005       OT LONG TERM GOAL #1   Title With maximum caregiver assistance Pt will be able to apply knee length, multi-layer, short stretch compression wraps daily from ankle to tibial tuberosity on one leg at a time using correct gradient techniques to return affected limb/s, as closely as possible, to premorbid size and shape, to limit leg pain and infection risk, and to improve safe functional mobility and ADLs performance. 01/03/21: Manufacturer's rep coming next week to assist with fitting adjustable, Velcro style , knee length compression leg wraps. Hopefiullly Pt will be able to manage these more independently, and may be more do-able for caregivers. Drawback is they do not manage foot swelling well.    Baseline dependent    Time 4    Period Days    Status Achieved   DC goal.     OT LONG TERM GOAL #2   Title With Maximum caregiver assistance Pt will be able to verbalize signs and symptoms of cellulitis infection and identify lymphedema precautions using printed resource (modified independence) for reference to decrease infection risk and limit LE progression over time.    Baseline dependent    Time 6    Period Days    Status Achieved      OT LONG TERM GOAL #3   Title With Maximum caregiver assistance Pt >85 % compliant with all daily, LE self-care protocols for home  program, including simple self-manual lymphatic drainage (MLD), skin care, lymphatic pumping ther ex, skin care, and compression wraps and garments  to limit LE progression and further functional decline.    Baseline dependent    Time 12    Period Weeks    Status Not Met   DC goal. Pt lives alone and has no consistent CG assistance with home program components other than skin care.   Target Date 06/13/21      OT LONG TERM GOAL #4   Baseline dependent    Time 12    Period Weeks    Status Not  Met      OT LONG TERM GOAL #5   Title Pt to tolerate compression wraps/ garments in keeping w/ prescribed daily wear regime within 1 week of issue date to progress and retain clinical and functional gains and to limit LE progression.    Baseline dependent    Time 12    Period Weeks    Status Achieved   met for LLE     OT LONG TERM GOAL #6   Title Pt to achieve at least 10% LLE limb volume reduction bilaterally during modified Intensive Phase CDT to reduce infection risk, risk of recurrent leg wounds, to improve functional performance of basic and instrumental ADLs, and to limit LE progression.    Baseline dependent    Time 12    Period Weeks    Status Partially Met   04/25/21 BLE clearly more swollen today; by visual assessment I estimate 15-20% increase on L and 10% increase on R from distal legs to toes.Volumetrics not taken today due to time constraints.   Target Date 06/13/21                   Plan - 06/28/21 1611     Clinical Impression Statement Family provided assistance to Pt for skin care and compression this past week resulting in improved skin condition and decreased swelling this week compared to last visit. Provided MLD, skin care and comression as established to BLE. Pt tolerated treatment without increased pain. Cont as per POC.    OT Occupational Profile and History Comprehensive Assessment- Review of records and extensive additional review of physical, cognitive,  psychosocial history related to current functional performance    Occupational Profile and client history currently impacting functional performance dimensia, inconsistent CG assistance with removing wraps, providing skin care and reapplying compression between clinical visits. S    Occupational performance deficits (Please refer to evaluation for details): ADL's;IADL's;Rest and Sleep;Play;Leisure;Social Participation;Other   body image   Body Structure / Function / Physical Skills ADL;Decreased knowledge of use of DME;Pain;Edema;Scar mobility;Decreased knowledge of precautions;Skin integrity    Rehab Potential Fair    Clinical Decision Making Multiple treatment options, significant modification of task necessary    Comorbidities Affecting Occupational Performance: Presence of comorbidities impacting occupational performance    Modification or Assistance to Complete Evaluation  Max significant modification of tasks or assist is necessary to complete    OT Frequency 1x / week    OT Duration 6 weeks   and PRN   OT Treatment/Interventions Self-care/ADL training;DME and/or AE instruction;Manual lymph drainage;Patient/family education;Compression bandaging;Therapeutic exercise;Manual Therapy;Other (comment)    Plan skin care to decrease infection risk and improve hydration    Recommended Other Services fit with adjustable , alternative compression wraps for optimal independence w/ donning and doffing. Cnsider CircAid with footpieces.    Consulted and Agree with Plan of Care Patient;Family member/caregiver             Patient will benefit from skilled therapeutic intervention in order to improve the following deficits and impairments:   Body Structure / Function / Physical Skills: ADL, Decreased knowledge of use of DME, Pain, Edema, Scar mobility, Decreased knowledge of precautions, Skin integrity       Visit Diagnosis: Lymphedema, not elsewhere classified    Problem List Patient Active  Problem List   Diagnosis Date Noted   PVD (peripheral vascular disease) ([place]) 04/27/2021   Medication noncompliance due to cognitive impairment 10/17/2019   Chronic pain of multiple joints 08/01/2017  Age-related osteoporosis without current pathological fracture 08/01/2017   Left knee pain 09/19/2016   Rectal benign neoplasm    Asthma, chronic 04/04/2015   history of Tubular adenoma of colon 04/04/2015   CKD (chronic kidney disease) stage 3, GFR 30-59 ml/min (HCC) 04/03/2015   Edema 13/24/4010   Chronic systolic heart failure ([place]) 11/20/2013   Lymphedema of lower extremity 10/17/2013   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[place]) 04/04/2013   MGUS (monoclonal gammopathy of unknown significance) 05/15/2012   Vitamin D deficiency 01/29/2010   DERMATITIS 09/30/2009   Paranoid delusion ([place]) 08/02/2008   Obstructive chronic bronchitis ([place]) 08/02/2008   Essential hypertension 11/11/2007   Venous (peripheral) insufficiency 11/11/2007   RENAL CALCULUS 11/11/2007   LOW BACK PAIN, CHRONIC 11/11/2007   Andrey Spearman, MS, OTR/L, CLT-LANA 06/28/21 4:15 PM   [place] MAIN [facility] SERVICES [address] [place], [place], [address] Phone: [phone]   Fax:  [phone]  Name: KEAGHAN BOWENS MRN: [id number] Date of Birth: December 06, 1931

## 2021-07-05 ENCOUNTER — Ambulatory Visit: Payer: Medicare Other | Attending: Family | Admitting: Occupational Therapy

## 2021-07-05 ENCOUNTER — Other Ambulatory Visit: Payer: Self-pay

## 2021-07-05 DIAGNOSIS — I890 Lymphedema, not elsewhere classified: Secondary | ICD-10-CM | POA: Diagnosis not present

## 2021-07-06 NOTE — Therapy (Signed)
[place] MAIN [facility] SERVICES [address] [place], [place], [address] Phone: [phone]   Fax:  [phone]  Occupational Therapy Treatment  Patient Details  Name: [name] MRN: [id number] Date of Birth: 1931-11-11 No data recorded  Encounter Date: 07/05/2021   OT End of Session - 07/05/21 1530     Visit Number 39    Number of Visits 72    Date for OT Re-Evaluation 09/12/21    Authorization Time Period 09/28/20 - 01/03/21    OT Start Time 0315    OT Stop Time 0415    OT Time Calculation (min) 60 min    Activity Tolerance Patient tolerated treatment well;Patient limited by pain;No increased pain    Behavior During Therapy WFL for tasks assessed/performed             Past Medical History:  Diagnosis Date   Anemia, unspecified    Anxiety    Arthritis    Asthma    Delusions ([place])    Depression    Edema 2005   "since put the glass in them"    GERD (gastroesophageal reflux disease)    Lumbago    Lymphedema    Obstructive chronic bronchitis without exacerbation ([place])    Other abnormal glucose    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[place]) 05/01/5328   Systolic heart failure ([place])    EF 35 to 40% per echo January 2015   Unspecified essential hypertension    Unspecified nonpsychotic mental disorder    Unspecified venous (peripheral) insufficiency    Unspecified venous (peripheral) insufficiency    Unspecified vitamin D deficiency     Past Surgical History:  Procedure Laterality Date   APPENDECTOMY     CERVICAL SPINE SURGERY     x 2   CHOLECYSTECTOMY     ESOPHAGOGASTRODUODENOSCOPY N/A 04/05/2015   Procedure: ESOPHAGOGASTRODUODENOSCOPY (EGD);  Surgeon: [name], MD;  Location: [facility];  Service: Endoscopy;  Laterality: N/A;   FLEXIBLE SIGMOIDOSCOPY N/A 04/05/2015   Procedure: FLEXIBLE SIGMOIDOSCOPY;  Surgeon: [name], MD;  Location: [facility];  Service: Endoscopy;  Laterality: N/A;   HAND SURGERY     LUMBAR DISC  SURGERY     PARTIAL HYSTERECTOMY     TONSILLECTOMY      There were no vitals filed for this visit.   Subjective Assessment - 07/05/21 1531     Subjective  [name] presents for OT Rx visit 39/72 to address BLE lymphedma. Pt is accompanied by her nephew, [name],  today. Pt reports 10/10 leg pain bilaterally this afternoon. Pt presents without wraps or compression garments in place.    Pertinent History Chronic obstructive bronchitis, CVI, anxiety , systoloc heart failure, asthma, arthritis, multiple spinal surgeries w/ chronic back pain, DM,    Limitations Difficulty walking, impaired standing tolerance, impaired transfers, decreased joint AROM at toes, ankles and knees, chronic leg swelling , chronic pain. increased infection and falls risks, cognitive impairment, generaized weakness    Repetition Increases Symptoms    Special Tests + [place] bilaterally    Patient Stated Goals reduce swelling and legs feel better    Pain Onset Other (comment)                          OT Treatments/Exercises (OP) - 07/06/21 1303       ADLs   ADL Education Given Yes      Manual Therapy   Manual Therapy Edema management;Manual Lymphatic  Drainage (MLD);Compression Bandaging    Manual Lymphatic Drainage (MLD) MLD to LLE as established. Pants worn to clinic severly limit access to inguinal LN      for proximal MLD.    Compression Bandaging Multilayer, gradient compression wraps applied to LLE using 1 each of short stretch bandage 8, 10 and 12 cm wide over single layer of fRosidal oam and soft cotton stockinett. from base of toes to tibial tuberosity. CircAid adjustable knee length garment alternative applied to RLE using  ~ 30-40 mmHg.    Other Manual Therapy skin care to BLE with low ph castor oil concurrent w MLD                  Upper Extremity Functional Index Score :   /80   OT Education - 07/05/21 1312     Education provided Yes    Education Details Continued Pt/  CG edu for lymphedema self care  and home program throughout session. Topics include multilayer, gradient compression wrapping, simple self-MLD, therapeutic lymphatic pumping exercises, skin/nail care, risk reduction factors and LE precautions, compression garments/recommendations and wear and care schedule and compression garment donning / doffing using assistive devices. All questions answered to the Pt's satisfaction, and Pt demonstrates understanding by report.    Person(s) Educated Patient;Caregiver(s)    Methods Explanation;Demonstration    Comprehension Verbalized understanding;Returned demonstration;Need further instruction                 OT Long Term Goals - 07/05/21 1300       OT LONG TERM GOAL #1   Title With maximum caregiver assistance Pt will be able to apply knee length, multi-layer, short stretch compression wraps daily from ankle to tibial tuberosity on one leg at a time using correct gradient techniques to return affected limb/s, as closely as possible, to premorbid size and shape, to limit leg pain and infection risk, and to improve safe functional mobility and ADLs performance. 01/03/21: Manufacturer's rep coming next week to assist with fitting adjustable, Velcro style , knee length compression leg wraps. Hopefiullly Pt will be able to manage these more independently, and may be more do-able for caregivers. Drawback is they do not manage foot swelling well.    Baseline dependent    Time 4    Period Days    Status Achieved   DC goal.   Target Date --   CG is able to apply correctly, but is typically unavailable to assist with this most days. Compliance with skin care and compression are inconsistent and minimal     OT LONG TERM GOAL #2   Title With Maximum caregiver assistance Pt will be able to verbalize signs and symptoms of cellulitis infection and identify lymphedema precautions using printed resource (modified independence) for reference to decrease infection risk  and limit LE progression over time.    Baseline dependent    Time 6    Period Days    Status Achieved      OT LONG TERM GOAL #3   Title With Maximum caregiver assistance Pt >85 % compliant with all daily, LE self-care protocols for home program, including simple self-manual lymphatic drainage (MLD), skin care, lymphatic pumping ther ex, skin care, and compression wraps and garments  to limit LE progression and further functional decline.    Baseline dependent    Time 12    Period Weeks    Status Not Met   Pt lives alone and has no consistent CG assistance with home program -  largely daily skin care and compression.     OT LONG TERM GOAL #4   Baseline dependent    Time 12    Period Weeks    Status Not Met      OT LONG TERM GOAL #5   Title Pt to tolerate compression wraps/ garments in keeping w/ prescribed daily wear regime within 1 week of issue date to progress and retain clinical and functional gains and to limit LE progression.    Baseline dependent    Time 12    Period Weeks    Status Achieved   met for LLE   Target Date --   Tolerance is good. Compliance with daily compression is poor due to limited CG availability     OT LONG TERM GOAL #6   Title Pt to achieve at least 10% LLE limb volume reduction bilaterally during modified Intensive Phase CDT to reduce infection risk, risk of recurrent leg wounds, to improve functional performance of basic and instrumental ADLs, and to limit LE progression.    Baseline dependent    Time 12    Period Weeks    Status Partially Met   04/25/21 BLE clearly more swollen today; by visual assessment I estimate 15-20% increase on L and 10% increase on R from distal legs to toes.Volumetrics not taken today due to time constraints.                  Plan - 07/05/21 1247     Clinical Impression Statement Reviewed goals  for progress note. Pt continues to demonstrate limited compliance with compression bandaging and/ or garments during week  long visit interval. Pt verbalized understanding that without consistent progress towards this goal treatment frequency will be reduced b/c persistent symptoms despite ongoing and consistent compliance w/ LE self-care demonstrates medical necessity for ongoing clinical support. Limb volumes and skin cndition continues to fluctuate weekly, which is appears to be contingent on availability of family and/ or paid caregiver. Pt continues to live alone and, unfortunately, is unable to apply compression and consistently perform askin care without assistance . Faily continues to search for reliable paid cargiver.    OT Occupational Profile and History Comprehensive Assessment- Review of records and extensive additional review of physical, cognitive, psychosocial history related to current functional performance    Occupational Profile and client history currently impacting functional performance dimensia, inconsistent CG assistance with removing wraps, providing skin care and reapplying compression between clinical visits. S    Occupational performance deficits (Please refer to evaluation for details): ADL's;IADL's;Rest and Sleep;Play;Leisure;Social Participation;Other   body image   Body Structure / Function / Physical Skills ADL;Decreased knowledge of use of DME;Pain;Edema;Scar mobility;Decreased knowledge of precautions;Skin integrity    Rehab Potential Fair    Clinical Decision Making Multiple treatment options, significant modification of task necessary    Comorbidities Affecting Occupational Performance: Presence of comorbidities impacting occupational performance    Modification or Assistance to Complete Evaluation  Max significant modification of tasks or assist is necessary to complete    OT Frequency 1x / week    OT Duration 6 weeks   and PRN   OT Treatment/Interventions Self-care/ADL training;DME and/or AE instruction;Manual lymph drainage;Patient/family education;Compression bandaging;Therapeutic  exercise;Manual Therapy;Other (comment)    Plan skin care to decrease infection risk and improve hydration    Recommended Other Services fit with adjustable , alternative compression wraps for optimal independence w/ donning and doffing. Cnsider CircAid with footpieces.    Consulted and Agree with Plan of  Care Patient;Family member/caregiver             Patient will benefit from skilled therapeutic intervention in order to improve the following deficits and impairments:   Body Structure / Function / Physical Skills: ADL, Decreased knowledge of use of DME, Pain, Edema, Scar mobility, Decreased knowledge of precautions, Skin integrity       Visit Diagnosis: Lymphedema, not elsewhere classified    Problem List Patient Active Problem List   Diagnosis Date Noted   PVD (peripheral vascular disease) ([place]) 04/27/2021   Medication noncompliance due to cognitive impairment 10/17/2019   Chronic pain of multiple joints 08/01/2017   Age-related osteoporosis without current pathological fracture 08/01/2017   Left knee pain 09/19/2016   Rectal benign neoplasm    Asthma, chronic 04/04/2015   history of Tubular adenoma of colon 04/04/2015   CKD (chronic kidney disease) stage 3, GFR 30-59 ml/min ([facility]) 04/03/2015   Edema 35/24/8185   Chronic systolic heart failure ([place]) 11/20/2013   Lymphedema of lower extremity 10/17/2013   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[place]) 04/04/2013   MGUS (monoclonal gammopathy of unknown significance) 05/15/2012   Vitamin D deficiency 01/29/2010   DERMATITIS 09/30/2009   Paranoid delusion ([place]) 08/02/2008   Obstructive chronic bronchitis ([place]) 08/02/2008   Essential hypertension 11/11/2007   Venous (peripheral) insufficiency 11/11/2007   RENAL CALCULUS 11/11/2007   LOW BACK PAIN, CHRONIC 11/11/2007    Andrey Spearman, MS, OTR/L, CLT-LANA 07/06/21 1:15 PM   [place] MAIN [facility] SERVICES [place], [place],  [address] Phone: [phone]   Fax:  [phone]  Name: [name] MRN: [id number] Date of Birth: 10/22/1932

## 2021-07-11 ENCOUNTER — Ambulatory Visit: Payer: Medicare Other | Admitting: Occupational Therapy

## 2021-07-19 ENCOUNTER — Ambulatory Visit: Payer: Medicare Other | Admitting: Occupational Therapy

## 2021-07-19 ENCOUNTER — Other Ambulatory Visit: Payer: Self-pay

## 2021-07-19 DIAGNOSIS — I890 Lymphedema, not elsewhere classified: Secondary | ICD-10-CM

## 2021-07-20 NOTE — Patient Instructions (Signed)

## 2021-07-20 NOTE — Therapy (Addendum)
[place] MAIN [facility] SERVICES [address] [place], [place], [address] Phone: [phone]   Fax:  [phone]  Occupational Therapy Treatment Note  and Progress Report: Lymphedema Care  Patient Details  Name: [name] MRN: [id number] Date of Birth: 10-02-1932 No data recorded  Reporting Period: 05/10/21 - 07/19/21  Encounter Date: 07/19/2021   OT End of Session - 07/19/21 1546     Visit Number 40    Number of Visits 72    Date for OT Re-Evaluation 09/12/21    Authorization Time Period 09/28/20 - 01/03/21    OT Start Time 0305    OT Stop Time 0415    OT Time Calculation (min) 70 min    Activity Tolerance Patient tolerated treatment well;Patient limited by pain;No increased pain    Behavior During Therapy WFL for tasks assessed/performed             Past Medical History:  Diagnosis Date   Anemia, unspecified    Anxiety    Arthritis    Asthma    Delusions ([place])    Depression    Edema 2005   "since put the glass in them"    GERD (gastroesophageal reflux disease)    Lumbago    Lymphedema    Obstructive chronic bronchitis without exacerbation ([place])    Other abnormal glucose    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[place]) 12/31/74   Systolic heart failure ([place])    EF 35 to 40% per echo January 2015   Unspecified essential hypertension    Unspecified nonpsychotic mental disorder    Unspecified venous (peripheral) insufficiency    Unspecified venous (peripheral) insufficiency    Unspecified vitamin D deficiency     Past Surgical History:  Procedure Laterality Date   APPENDECTOMY     CERVICAL SPINE SURGERY     x 2   CHOLECYSTECTOMY     ESOPHAGOGASTRODUODENOSCOPY N/A 04/05/2015   Procedure: ESOPHAGOGASTRODUODENOSCOPY (EGD);  Surgeon: [name], MD;  Location: [facility];  Service: Endoscopy;  Laterality: N/A;   FLEXIBLE SIGMOIDOSCOPY N/A 04/05/2015   Procedure: FLEXIBLE SIGMOIDOSCOPY;  Surgeon: [name], MD;  Location: [facility]  END[facility]COPY;  Service: Endoscopy;  Laterality: N/A;   HAND SURGERY     LUMBAR DISC SURGERY     PARTIAL HYSTERECTOMY     TONSILLECTOMY      There were no vitals filed for this visit.   Subjective Assessment - 07/19/21 0932     Subjective  EYMI LIPUMA presents for OT Rx visit  40//72 to address BLE lymphedma. Pt is accompanied by her nephew, [name],  today. Pt reports leg pain , but does not rate numerically. Pt presents without wraps or compression garments in place. Pt agrees with plan to complete progress review today.    Patient is accompanied by: Family member    Pertinent History Chronic obstructive bronchitis, CVI, anxiety , systoloc heart failure, asthma, arthritis, multiple spinal surgeries w/ chronic back pain, DM,    Limitations Difficulty walking, impaired standing tolerance, impaired transfers, decreased joint AROM at toes, ankles and knees, chronic leg swelling , chronic pain. increased infection and falls risks, cognitive impairment, generaized weakness    Repetition Increases Symptoms    Special Tests + Stemmer bilaterally    Patient Stated Goals reduce swelling and legs feel better    Currently in Pain? Yes    Pain Location Leg    Pain Orientation Right;Left    Pain Onset Other (comment)    Pain Frequency  Constant    Multiple Pain Sites Yes    Pain Location Back                 LYMPHEDEMA/ONCOLOGY QUESTIONNAIRE - 07/20/21 0936       Lymphedema Assessments   Lymphedema Assessments Lower extremities      Right Lower Extremity Lymphedema   Other R leg from ankle to tibia tuberosity (A-D) measures 3693.6 ml.    Other RLE limb volume is DECREASED by 10.7 % since last measured on 07/23/18      Left Lower Extremity Lymphedema   Other L leg from ankle to tibia tuberosity (A-D) measures 4308.8  ml.    Other LLE limb volume is INCREASED by 4.1 % since last measured on 12/27/20                     OT Treatments/Exercises (OP) - 07/20/21 0935        ADLs   ADL Education Given Yes      Manual Therapy   Manual Therapy Edema management;Compression Bandaging    Edema Management BLE comparative limb volumetrics    Compression Bandaging Multilayer, gradient compression wraps applied to LLE using 1 each of short stretch bandage 8, 10 and 12 cm wide over single layer of fRosidal oam and soft cotton stockinett. from base of toes to tibial tuberosity. CircAid adjustable knee length garment alternative applied to RLE using  ~ 30-40 mmHg.                    OT Education - 07/20/21 1003     Education provided Yes    Education Details Continued Pt/ CG edu for lymphedema self care  and home program throughout session. Topics include multilayer, gradient compression wrapping, simple self-MLD, therapeutic lymphatic pumping exercises, skin/nail care, risk reduction factors and LE precautions, compression garments/recommendations and wear and care schedule and compression garment donning / doffing using assistive devices. All questions answered to the Pt's satisfaction, and Pt demonstrates understanding by report.    Person(s) Educated Patient;Caregiver(s)    Methods Explanation;Demonstration    Comprehension Verbalized understanding;Returned demonstration;Need further instruction                 OT Long Term Goals - 07/19/21 2951       OT LONG TERM GOAL #1   Title With maximum caregiver assistance Pt will be able to apply knee length, multi-layer, short stretch compression wraps daily from ankle to tibial tuberosity on one leg at a time using correct gradient techniques to return affected limb/s, as closely as possible, to premorbid size and shape, to limit leg pain and infection risk, and to improve safe functional mobility and ADLs performance. 01/03/21: Manufacturer's rep coming next week to assist with fitting adjustable, Velcro style , knee length compression leg wraps. Hopefiullly Pt will be able to manage these more independently,  and may be more do-able for caregivers. Drawback is they do not manage foot swelling well.    Baseline dependent    Time 4    Period Days    Status Not Met   CG trained to wrap correctly and to don afdjustable daytime garments, , but Pt remains noncompliant with daily compression.     OT LONG TERM GOAL #2   Title With Maximum caregiver assistance Pt will be able to verbalize signs and symptoms of cellulitis infection and identify lymphedema precautions using printed resource (modified independence) for reference to decrease infection risk and limit  LE progression over time.    Baseline dependent    Time 6    Period Days    Status Achieved      OT LONG TERM GOAL #3   Title With Maximum caregiver assistance Pt >85 % compliant with all daily, LE self-care protocols for home program, including simple self-manual lymphatic drainage (MLD), skin care, lymphatic pumping ther ex, skin care, and compression wraps and garments  to limit LE progression and further functional decline.    Baseline dependent    Time 12    Period Weeks    Status Not Met   Pt lives alone and has no consistent CG assistance with skin care, compression wraps and / or daytime garments, ther ex and MLD     OT LONG TERM GOAL #4   Baseline dependent    Time 12    Period Weeks    Status Deferred      OT LONG TERM GOAL #5   Title Pt to tolerate compression wraps/ garments in keeping w/ prescribed daily wear regime within 1 week of issue date to progress and retain clinical and functional gains and to limit LE progression.    Baseline dependent    Time 12    Period Weeks    Status Achieved   Met, but non-compliant with daytime compression and wraps     OT LONG TERM GOAL #6   Title Pt to achieve at least 10% LLE limb volume reduction bilaterally during modified Intensive Phase CDT to reduce infection risk, risk of recurrent leg wounds, to improve functional performance of basic and instrumental ADLs, and to limit LE  progression.    Baseline dependent    Time 12    Period Weeks    Status Partially Met   04/25/21 BLE clearly more swollen today; by visual assessment I estimate 15-20% increase on L and 10% increase on R from distal legs to toes.Volumetrics not taken today due to time constraints.                  Plan - 07/20/21 0949     Clinical Impression Statement Reviewed progress towards all goals and rational for reducing OT Rx frequency. As for comparative limb volumes measured today, RLE limb volume is DECREASED by 10.7 % since last measured on 07/23/18. RLE is affected, but swelling is typically stable. Skin condition fluctuates with LLE. LLE, the treatment limb,  limb volume is INCREASED by 4.1 % since last measured on 12/27/20. LLE continues to fluctuate quite a bit  due to non-compliance with compression  wraps and/ or adjustable compression garments during visit intervals over the past year. Pt and caregivers have been educated repeatedly on the importance of daily compression and on diligent performance of all LE self-care home program components, but Pt live alone at home and  has continues to have inadequate caregiver assistannce during the week with LE home program. Non-compliance and lack of assistance are most significant barriers to lymphedema management. Condition continues to slowly progress, however, she has remained infection and wound free this past year. Please refer to LONG TERM GOALS section for additional details on progress to date. Nephew and multiple caregivers have been taught and are able to perform gradient compression bandaging, donning and doffing of adjustable knee length circAid leggings, skin care techniques, but decline learning simple self-MLD. Pt reluctantly , and nephew agree with assessment that Pt has reached a clinical plateau with progress towards goals, and 1 x weekly OT for  LE care is no longer indicated. Freqency will decrease to every other week for the next 2  months,m then to follow along q 3 months and PRN from there.    OT Occupational Profile and History Comprehensive Assessment- Review of records and extensive additional review of physical, cognitive, psychosocial history related to current functional performance    Occupational Profile and client history currently impacting functional performance dimensia, inconsistent CG assistance with removing wraps, providing skin care and reapplying compression between clinical visits. S    Occupational performance deficits (Please refer to evaluation for details): ADL's;IADL's;Rest and Sleep;Play;Leisure;Social Participation;Other   body image   Body Structure / Function / Physical Skills ADL;Decreased knowledge of use of DME;Pain;Edema;Scar mobility;Decreased knowledge of precautions;Skin integrity    Rehab Potential Fair    Clinical Decision Making Multiple treatment options, significant modification of task necessary    Comorbidities Affecting Occupational Performance: Presence of comorbidities impacting occupational performance    Modification or Assistance to Complete Evaluation  Max significant modification of tasks or assist is necessary to complete    OT Frequency 1x / week    OT Duration 6 weeks   and PRN   OT Treatment/Interventions Self-care/ADL training;DME and/or AE instruction;Manual lymph drainage;Patient/family education;Compression bandaging;Therapeutic exercise;Manual Therapy;Other (comment)    Plan skin care to decrease infection risk and improve hydration    Recommended Other Services fit with adjustable , alternative compression wraps for optimal independence w/ donning and doffing. Cnsider CircAid with footpieces.    Consulted and Agree with Plan of Care Patient;Family member/caregiver             Patient will benefit from skilled therapeutic intervention in order to improve the following deficits and impairments:   Body Structure / Function / Physical Skills: ADL, Decreased  knowledge of use of DME, Pain, Edema, Scar mobility, Decreased knowledge of precautions, Skin integrity       Visit Diagnosis: Lymphedema, not elsewhere classified    Problem List Patient Active Problem List   Diagnosis Date Noted   PVD (peripheral vascular disease) ([place]) 04/27/2021   Medication noncompliance due to cognitive impairment 10/17/2019   Chronic pain of multiple joints 08/01/2017   Age-related osteoporosis without current pathological fracture 08/01/2017   Left knee pain 09/19/2016   Rectal benign neoplasm    Asthma, chronic 04/04/2015   history of Tubular adenoma of colon 04/04/2015   CKD (chronic kidney disease) stage 3, GFR 30-59 ml/min ([facility]) 04/03/2015   Edema 33/00/7622   Chronic systolic heart failure ([place]) 11/20/2013   Lymphedema of lower extremity 10/17/2013   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[place]) 04/04/2013   MGUS (monoclonal gammopathy of unknown significance) 05/15/2012   Vitamin D deficiency 01/29/2010   DERMATITIS 09/30/2009   Paranoid delusion ([place]) 08/02/2008   Obstructive chronic bronchitis ([place]) 08/02/2008   Essential hypertension 11/11/2007   Venous (peripheral) insufficiency 11/11/2007   RENAL CALCULUS 11/11/2007   LOW BACK PAIN, CHRONIC 11/11/2007    Andrey Spearman, MS, OTR/L, CLT-LANA 07/20/21 12:19 PM   [place] MAIN [facility] SERVICES [place], [place], [address] Phone: [phone]   Fax:  [phone]  Name: JAMEIA MAKRIS MRN: [id number] Date of Birth: 09/05/1932

## 2021-07-26 ENCOUNTER — Ambulatory Visit: Payer: Medicare Other | Admitting: Occupational Therapy

## 2021-07-29 ENCOUNTER — Other Ambulatory Visit: Payer: Medicare Other

## 2021-07-29 ENCOUNTER — Ambulatory Visit: Payer: Medicare Other | Admitting: Nurse Practitioner

## 2021-08-01 ENCOUNTER — Ambulatory Visit: Payer: Medicare Other | Attending: Family | Admitting: Occupational Therapy

## 2021-08-01 ENCOUNTER — Other Ambulatory Visit: Payer: Self-pay

## 2021-08-01 DIAGNOSIS — I890 Lymphedema, not elsewhere classified: Secondary | ICD-10-CM | POA: Insufficient documentation

## 2021-08-02 NOTE — Patient Instructions (Signed)

## 2021-08-02 NOTE — Therapy (Signed)
[place] MAIN [facility] SERVICES [address] [place], [place], [address] Phone: [phone]   Fax:  [phone]  Occupational Therapy Treatment  Patient Details  Name: [name] MRN: [id number] Date of Birth: 03/23/1932 No data recorded  Encounter Date: 08/01/2021   OT End of Session - 08/01/21 1520     Visit Number 41    Number of Visits 48    Date for OT Re-Evaluation 09/12/21    Authorization Time Period 09/28/20 - 01/03/21    OT Start Time 0310    OT Stop Time 0420    OT Time Calculation (min) 70 min    Activity Tolerance Patient tolerated treatment well;Patient limited by pain;No increased pain    Behavior During Therapy WFL for tasks assessed/performed             Past Medical History:  Diagnosis Date   Anemia, unspecified    Anxiety    Arthritis    Asthma    Delusions ([place])    Depression    Edema 2005   "since put the glass in them"    GERD (gastroesophageal reflux disease)    Lumbago    Lymphedema    Obstructive chronic bronchitis without exacerbation ([place])    Other abnormal glucose    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[place]) 3/0/9407   Systolic heart failure ([place])    EF 35 to 40% per echo January 2015   Unspecified essential hypertension    Unspecified nonpsychotic mental disorder    Unspecified venous (peripheral) insufficiency    Unspecified venous (peripheral) insufficiency    Unspecified vitamin D deficiency     Past Surgical History:  Procedure Laterality Date   APPENDECTOMY     CERVICAL SPINE SURGERY     x 2   CHOLECYSTECTOMY     ESOPHAGOGASTRODUODENOSCOPY N/A 04/05/2015   Procedure: ESOPHAGOGASTRODUODENOSCOPY (EGD);  Surgeon: [name], MD;  Location: [facility];  Service: Endoscopy;  Laterality: N/A;   FLEXIBLE SIGMOIDOSCOPY N/A 04/05/2015   Procedure: FLEXIBLE SIGMOIDOSCOPY;  Surgeon: [name], MD;  Location: [facility];  Service: Endoscopy;  Laterality: N/A;   HAND SURGERY     LUMBAR DISC  SURGERY     PARTIAL HYSTERECTOMY     TONSILLECTOMY      There were no vitals filed for this visit.   Subjective Assessment - 08/02/21 1324     Subjective  [name] presents for OT Rx visit  6808 to address BLE lymphedma. Pt is accompanied by her nephew, [name],  today. Pt reports leg pain , but does not rate numerically. Pt presents without wraps in place bilaterally, but application is incorrect. Pt and nephew reports niece from out of town was visiting and she insisted on applying wraps using her method instead of how OT taught  fmily caregivers. [name] reports that he has applied ircAids  daily              on weekdays for Pt when he has been in town.    Patient is accompanied by: Family member    Pertinent History Chronic obstructive bronchitis, CVI, anxiety , systoloc heart failure, asthma, arthritis, multiple spinal surgeries w/ chronic back pain, DM,    Limitations Difficulty walking, impaired standing tolerance, impaired transfers, decreased joint AROM at toes, ankles and knees, chronic leg swelling , chronic pain. increased infection and falls risks, cognitive impairment, generaized weakness    Repetition Increases Symptoms    Special Tests + Stemmer bilaterally  Patient Stated Goals reduce swelling and legs feel better    Pain Onset Other (comment)                                  OT Education - 08/02/21 1326     Education provided Yes    Education Details Continued Pt/ CG edu for lymphedema self care  and home program throughout session. Topics include multilayer, gradient compression wrapping, simple self-MLD, therapeutic lymphatic pumping exercises, skin/nail care, risk reduction factors and LE precautions, compression garments/recommendations and wear and care schedule and compression garment donning / doffing using assistive devices. All questions answered to the Pt's satisfaction, and Pt demonstrates understanding by report.    Person(s) Educated  Patient;Caregiver(s)    Methods Explanation;Demonstration    Comprehension Verbalized understanding;Returned demonstration;Need further instruction                 OT Long Term Goals - 07/19/21 8850       OT LONG TERM GOAL #1   Title With maximum caregiver assistance Pt will be able to apply knee length, multi-layer, short stretch compression wraps daily from ankle to tibial tuberosity on one leg at a time using correct gradient techniques to return affected limb/s, as closely as possible, to premorbid size and shape, to limit leg pain and infection risk, and to improve safe functional mobility and ADLs performance. 01/03/21: Manufacturer's rep coming next week to assist with fitting adjustable, Velcro style , knee length compression leg wraps. Hopefiullly Pt will be able to manage these more independently, and may be more do-able for caregivers. Drawback is they do not manage foot swelling well.    Baseline dependent    Time 4    Period Days    Status Not Met   CG trained to wrap correctly and to don afdjustable daytime garments, , but Pt remains noncompliant with daily compression.     OT LONG TERM GOAL #2   Title With Maximum caregiver assistance Pt will be able to verbalize signs and symptoms of cellulitis infection and identify lymphedema precautions using printed resource (modified independence) for reference to decrease infection risk and limit LE progression over time.    Baseline dependent    Time 6    Period Days    Status Achieved      OT LONG TERM GOAL #3   Title With Maximum caregiver assistance Pt >85 % compliant with all daily, LE self-care protocols for home program, including simple self-manual lymphatic drainage (MLD), skin care, lymphatic pumping ther ex, skin care, and compression wraps and garments  to limit LE progression and further functional decline.    Baseline dependent    Time 12    Period Weeks    Status Not Met   Pt lives alone and has no consistent CG  assistance with skin care, compression wraps and / or daytime garments, ther ex and MLD     OT LONG TERM GOAL #4   Baseline dependent    Time 12    Period Weeks    Status Deferred      OT LONG TERM GOAL #5   Title Pt to tolerate compression wraps/ garments in keeping w/ prescribed daily wear regime within 1 week of issue date to progress and retain clinical and functional gains and to limit LE progression.    Baseline dependent    Time 12    Period Weeks  Status Achieved   Met, but non-compliant with daytime compression and wraps     OT LONG TERM GOAL #6   Title Pt to achieve at least 10% LLE limb volume reduction bilaterally during modified Intensive Phase CDT to reduce infection risk, risk of recurrent leg wounds, to improve functional performance of basic and instrumental ADLs, and to limit LE progression.    Baseline dependent    Time 12    Period Weeks    Status Partially Met   04/25/21 BLE clearly more swollen today; by visual assessment I estimate 15-20% increase on L and 10% increase on R from distal legs to toes.Volumetrics not taken today due to time constraints.                  Plan - 08/02/21 1314     Clinical Impression Statement Pt presents with BLE wrapped below the knees using incorrect gradient techniques. Pt and nephew r4port that family member from out of state stayed with patient for a few days and applied wraps a method the created, not the method taught by OT. Happily because Pt has been using compression wraps more consistently lately, her legs and feet are less swollen today than has been typical, and skin condition is improved with daILY lotion. [name], Pt's  nephew, reports he has ben applyoing CircAid adjustabl;e, bilateral, compression wraps daily since last visit except when he was out of town. Provided mld and simultaneous skin care to most involved LLE . Modified wrap application today to increase compression on feet when using CircAids by wrapping  feet and ankles with single layer of Rosidal foam over stockinett, then applying single 10 cm wide short stretch wrap over that substraight. Pt tolerated all aspects of manual therapy today. Cont q 2 weeks as per POC.    OT Occupational Profile and History Comprehensive Assessment- Review of records and extensive additional review of physical, cognitive, psychosocial history related to current functional performance    Occupational Profile and client history currently impacting functional performance dimensia, inconsistent CG assistance with removing wraps, providing skin care and reapplying compression between clinical visits. S    Occupational performance deficits (Please refer to evaluation for details): ADL's;IADL's;Rest and Sleep;Play;Leisure;Social Participation;Other   body image   Body Structure / Function / Physical Skills ADL;Decreased knowledge of use of DME;Pain;Edema;Scar mobility;Decreased knowledge of precautions;Skin integrity    Rehab Potential Fair    Clinical Decision Making Multiple treatment options, significant modification of task necessary    Comorbidities Affecting Occupational Performance: Presence of comorbidities impacting occupational performance    Modification or Assistance to Complete Evaluation  Max significant modification of tasks or assist is necessary to complete    OT Frequency Other (comment)   every other week   OT Duration 12 weeks   and PRN ongoing to ensure optimal LE management and to support caregiver. Without ongoing skilled OT condition will worsen and further functional decline is expected   OT Treatment/Interventions Self-care/ADL training;DME and/or AE instruction;Manual lymph drainage;Patient/family education;Compression bandaging;Therapeutic exercise;Manual Therapy;Other (comment)    Plan skin care to decrease infection risk and improve hydration    Recommended Other Services fit with adjustable , alternative compression wraps for optimal independence  w/ donning and doffing. Cnsider CircAid with footpieces.    Consulted and Agree with Plan of Care Patient;Family member/caregiver             Patient will benefit from skilled therapeutic intervention in order to improve the following deficits and impairments:  Body Structure / Function / Physical Skills: ADL, Decreased knowledge of use of DME, Pain, Edema, Scar mobility, Decreased knowledge of precautions, Skin integrity       Visit Diagnosis: Lymphedema, not elsewhere classified    Problem List Patient Active Problem List   Diagnosis Date Noted   PVD (peripheral vascular disease) ([place]) 04/27/2021   Medication noncompliance due to cognitive impairment 10/17/2019   Chronic pain of multiple joints 08/01/2017   Age-related osteoporosis without current pathological fracture 08/01/2017   Left knee pain 09/19/2016   Rectal benign neoplasm    Asthma, chronic 04/04/2015   history of Tubular adenoma of colon 04/04/2015   CKD (chronic kidney disease) stage 3, GFR 30-59 ml/min (HCC) 04/03/2015   Edema 09/38/1829   Chronic systolic heart failure ([place]) 11/20/2013   Lymphedema of lower extremity 10/17/2013   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[place]) 04/04/2013   MGUS (monoclonal gammopathy of unknown significance) 05/15/2012   Vitamin D deficiency 01/29/2010   DERMATITIS 09/30/2009   Paranoid delusion ([place]) 08/02/2008   Obstructive chronic bronchitis ([place]) 08/02/2008   Essential hypertension 11/11/2007   Venous (peripheral) insufficiency 11/11/2007   RENAL CALCULUS 11/11/2007   LOW BACK PAIN, CHRONIC 11/11/2007   Andrey Spearman, MS, OTR/L, CLT-LANA 08/02/21 1:30 PM   [place] MAIN [facility] SERVICES [address] [place], [place], [address] Phone: [phone]   Fax:  [phone]  Name: KRYSTALE RINKENBERGER MRN: [id number] Date of Birth: 04/24/1932

## 2021-08-08 ENCOUNTER — Encounter: Payer: Medicare Other | Admitting: Occupational Therapy

## 2021-08-15 ENCOUNTER — Ambulatory Visit: Payer: Medicare Other | Admitting: Occupational Therapy

## 2021-08-22 ENCOUNTER — Encounter: Payer: Medicare Other | Admitting: Occupational Therapy

## 2021-08-26 ENCOUNTER — Other Ambulatory Visit: Payer: Self-pay

## 2021-08-26 ENCOUNTER — Ambulatory Visit (INDEPENDENT_AMBULATORY_CARE_PROVIDER_SITE_OTHER): Payer: Medicare Other | Admitting: Nurse Practitioner

## 2021-08-26 ENCOUNTER — Encounter: Payer: Self-pay | Admitting: Nurse Practitioner

## 2021-08-26 ENCOUNTER — Other Ambulatory Visit: Payer: Medicare Other

## 2021-08-26 VITALS — BP 116/70 | HR 56 | Temp 97.9°F | Ht 59.0 in | Wt 137.0 lb

## 2021-08-26 DIAGNOSIS — E2839 Other primary ovarian failure: Secondary | ICD-10-CM

## 2021-08-26 DIAGNOSIS — I10 Essential (primary) hypertension: Secondary | ICD-10-CM | POA: Diagnosis not present

## 2021-08-26 DIAGNOSIS — I5022 Chronic systolic (congestive) heart failure: Secondary | ICD-10-CM

## 2021-08-26 DIAGNOSIS — N1832 Chronic kidney disease, stage 3b: Secondary | ICD-10-CM

## 2021-08-26 DIAGNOSIS — Z23 Encounter for immunization: Secondary | ICD-10-CM | POA: Diagnosis not present

## 2021-08-26 DIAGNOSIS — F22 Delusional disorders: Secondary | ICD-10-CM

## 2021-08-26 DIAGNOSIS — E559 Vitamin D deficiency, unspecified: Secondary | ICD-10-CM | POA: Diagnosis not present

## 2021-08-26 DIAGNOSIS — I890 Lymphedema, not elsewhere classified: Secondary | ICD-10-CM | POA: Diagnosis not present

## 2021-08-26 DIAGNOSIS — M810 Age-related osteoporosis without current pathological fracture: Secondary | ICD-10-CM

## 2021-08-26 MED ORDER — OLANZAPINE 5 MG PO TABS: ORAL_TABLET | ORAL | 3 refills | Status: DC

## 2021-08-26 NOTE — Patient Instructions (Addendum)
Vit d 2000 units daily by mouth  Calcium 600 mg twice daily by mouth   Schedule bone density [phone]  COVID vaccine and shingles vaccine at your local pharmacy

## 2021-08-26 NOTE — Progress Notes (Signed)
Careteam: Patient Care Team: Lauree Chandler, NP as PCP - General (Geriatric Medicine) [name], MD (Pulmonary Disease) Jacolyn Reedy, MD as Consulting Physician (Cardiology)  PLACE OF SERVICE:  [place] Directive information Does Patient Have a Medical Advance Directive?: Yes, Type of Advance Directive: [place], Does patient want to make changes to medical advance directive?: No - Patient declined  Allergies  Allergen Reactions   Aspirin Other (See Comments)    REACTION: upset stomach   Codeine Other (See Comments)    REACTION: "MAKES MY BODY GO CRAZY; CRAMPS"   Latex Nausea And Vomiting    Chief Complaint  Patient presents with   Medical Management of Chronic Issues    4 month follow-up with same day labs. Care gaps reviewed, discuss need for shingrix and covid, or exclude if patient refuses. Flu vaccine today. Here with nephew, [name]. Renew  Cal w/vitD to local pharmacy. Discuss prescribed Vit D.      HPI: Patient is a 87 y.o. female for routine follow up   Continues with lymphedema clinic and restarted demadex  Osteoporosis- not due for prolia until dec. Recommend cal and vit d but not taking at this time.   Chf- stable. On coreg with demadex and potassium supplement.   Anemia- on iron supplement.   Vit d def- not taking any supplement.   Has been off zyprexa and mood has been up and down, allen reports she needs to restart zyprexa.    Review of Systems:  Review of Systems  Constitutional:  Negative for chills, fever and weight loss.  HENT:  Negative for tinnitus.   Respiratory:  Negative for cough, sputum production and shortness of breath.   Cardiovascular:  Positive for leg swelling. Negative for chest pain and palpitations.  Gastrointestinal:  Negative for abdominal pain, constipation, diarrhea and heartburn.  Genitourinary:  Negative for dysuria, frequency and urgency.  Musculoskeletal:  Negative for back  pain, falls, joint pain and myalgias.  Skin: Negative.   Neurological:  Negative for dizziness and headaches.  Psychiatric/Behavioral:  Negative for depression and memory loss. The patient does not have insomnia.    Past Medical History:  Diagnosis Date   Anemia, unspecified    Anxiety    Arthritis    Asthma    Delusions ([place])    Depression    Edema 2005   "since put the glass in them"    GERD (gastroesophageal reflux disease)    Lumbago    Lymphedema    Obstructive chronic bronchitis without exacerbation ([place])    Other abnormal glucose    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[place]) 11/01/2447   Systolic heart failure ([place])    EF 35 to 40% per echo January 2015   Unspecified essential hypertension    Unspecified nonpsychotic mental disorder    Unspecified venous (peripheral) insufficiency    Unspecified venous (peripheral) insufficiency    Unspecified vitamin D deficiency    Past Surgical History:  Procedure Laterality Date   APPENDECTOMY     CERVICAL SPINE SURGERY     x 2   CHOLECYSTECTOMY     ESOPHAGOGASTRODUODENOSCOPY N/A 04/05/2015   Procedure: ESOPHAGOGASTRODUODENOSCOPY (EGD);  Surgeon: [name], MD;  Location: [facility];  Service: Endoscopy;  Laterality: N/A;   FLEXIBLE SIGMOIDOSCOPY N/A 04/05/2015   Procedure: FLEXIBLE SIGMOIDOSCOPY;  Surgeon: [name], MD;  Location: [facility];  Service: Endoscopy;  Laterality: N/A;   HAND SURGERY     LUMBAR DISC SURGERY  PARTIAL HYSTERECTOMY     TONSILLECTOMY     Social History:   reports that she has never smoked. She has never used smokeless tobacco. She reports that she does not drink alcohol and does not use drugs.  Family History  Problem Relation Age of Onset   Diabetes Mother    Arthritis Father    Diabetes Paternal Grandfather        entire family on both sides   Heart disease Maternal Aunt        entire family of both sides   Cancer Paternal Aunt        type unknown   Diabetes Sister    Breast cancer Cousin      Medications: Patient's Medications  New Prescriptions   No medications on file  Previous Medications   ACETAMINOPHEN (TYLENOL) 650 MG CR TABLET    Take 650 mg by mouth as needed for pain.   ALBUTEROL (PROAIR HFA) 108 (90 BASE) MCG/ACT INHALER    INHALE 2 PUFFS EVERY 4 HOURS AS NEEDED FOR WHEEZING   CALCIUM-VITAMIN D (OSCAL WITH D) 500-200 MG-UNIT TABLET    Take 1 tablet by mouth daily with breakfast.   CARVEDILOL (COREG) 6.25 MG TABLET    TAKE 1 TABLET BY MOUTH TWICE DAILY IN LUNCH AND SUPPER   CETIRIZINE (ZYRTEC) 10 MG TABLET    Take 1 tablet (10 mg total) by mouth daily.   DENOSUMAB (PROLIA) 60 MG/ML SOSY INJECTION    Inject 60 mg into the skin every 6 (six) months.   FEROSUL 325 (65 FE) MG TABLET    TAKE 1 TABLET(325 MG) BY MOUTH DAILY WITH BREAKFAST   FLUTICASONE-SALMETEROL (ADVAIR HFA) 115-21 MCG/ACT INHALER    INHALE 2 PUFFS BY MOUTH TWICE DAILY   HYDRALAZINE (APRESOLINE) 50 MG TABLET    TAKE 1 TABLET BY MOUTH TWICE DAILY AT 8 AM AND 10 PM   OLANZAPINE (ZYPREXA) 5 MG TABLET    TAKE 1 TABLET(5 MG) BY MOUTH AT BEDTIME   OMEPRAZOLE (PRILOSEC) 20 MG CAPSULE    TAKE 1 CAPSULE(20 MG) BY MOUTH DAILY   POTASSIUM CHLORIDE SA (KLOR-CON) 20 MEQ TABLET    Take 1 tablet (20 mEq total) by mouth daily. APPOINTMENT DUE   TORSEMIDE (DEMADEX) 20 MG TABLET    Take 1 tablet (20 mg total) by mouth daily.   VITAMIN D, ERGOCALCIFEROL, (DRISDOL) 1.25 MG (50000 UNIT) CAPS CAPSULE    Take 50,000 Units by mouth once a week.  Modified Medications   No medications on file  Discontinued Medications   No medications on file    Physical Exam:  Vitals:   08/26/21 1434  BP: 116/70  Pulse: (!) 56  Temp: 97.9 F (36.6 C)  TempSrc: Temporal  SpO2: 98%  Weight: 137 lb (62.1 kg)  Height: _0  (1.499 m)   Body mass index is 27.67 kg/m. Wt Readings from Last 3 Encounters:  08/26/21 137 lb (62.1 kg)  04/27/21 138 lb (62.6 kg)  10/01/20 143 lb (64.9 kg)    Physical Exam Constitutional:       General: She is not in acute distress.    Appearance: She is well-developed. She is not diaphoretic.  HENT:     Head: Normocephalic and atraumatic.     Mouth/Throat:     Pharynx: No oropharyngeal exudate.  Eyes:     Conjunctiva/sclera: Conjunctivae normal.     Pupils: Pupils are equal, round, and reactive to light.  Cardiovascular:     Rate and Rhythm:  Normal rate and regular rhythm.     Heart sounds: Normal heart sounds.  Pulmonary:     Effort: Pulmonary effort is normal.     Breath sounds: Normal breath sounds.  Abdominal:     General: Bowel sounds are normal.     Palpations: Abdomen is soft.  Musculoskeletal:     Cervical back: Normal range of motion and neck supple.     Right lower leg: Edema (2+) present.     Left lower leg: Edema (2+) present.  Skin:    General: Skin is warm and dry.  Neurological:     Mental Status: She is alert.  Psychiatric:        Mood and Affect: Mood normal.    Labs reviewed: Basic Metabolic Panel: Recent Labs    09/27/20 1634 04/27/21 1534  NA 138 140  K 3.7 4.0  CL 103 105  CO2 27 23  GLUCOSE 74 79  BUN 25 26*  CREATININE 1.47* 1.58*  CALCIUM 10.0 9.8   Liver Function Tests: Recent Labs    09/27/20 1634 04/27/21 1534  AST 14 16  ALT 8 8  BILITOT 0.4 0.5  PROT 8.8* 8.1   No results for input(s): LIPASE, AMYLASE in the last 8760 hours. No results for input(s): AMMONIA in the last 8760 hours. CBC: Recent Labs    09/27/20 1634 04/27/21 1534  WBC 4.4 5.4  NEUTROABS 2,710 3,521  HGB 10.1* 9.8*  HCT 31.7* 31.3*  MCV 85.4 86.9  PLT 213 249   Lipid Panel: No results for input(s): CHOL, HDL, LDLCALC, TRIG, CHOLHDL, LDLDIRECT in the last 8760 hours. TSH: No results for input(s): TSH in the last 8760 hours. A1C: Lab Results  Component Value Date   HGBA1C 5.5 04/14/2014     Assessment/Plan 1. Paranoid delusion ([place]) -stable on medication but has been off medication recently.  - OLANZapine (ZYPREXA) 5 MG tablet; TAKE  1 TABLET(5 MG) BY MOUTH AT BEDTIME  Dispense: 90 tablet; Refill: 3 - Ambulatory referral to Social Work  2. Need for influenza vaccination - Flu Vaccine QUAD High Dose(Fluad)  3. Chronic systolic heart failure (HCC) -stable on current regimen.  - CMP with eGFR([facility])  4. Essential hypertension --stable. Goal bp <140/90. Continue on current regimen with low sodium diet.  - CMP with eGFR([facility]) - CBC with Differential/Platelet  5. Vitamin D deficiency -not currently on supplement. To start vit d 2000 units daily   6. Stage 3b chronic kidney disease (HCC) -Chronic and stable Encourage proper hydration Follow metabolic panel Avoid nephrotoxic meds (NSAIDS)  7. Lymphedema of both lower extremities Stable, continues to follow up with clinic and has caregivers to help her wrap her legs. - Ambulatory referral to Social Work  8. Estrogen deficiency - DG Bone Density; Future  9. Age-related osteoporosis without current pathological fracture -continues on prolia.  -Recommended to take calcium 600 mg twice daily with Vitamin D 2000 units daily  - DG Bone Density; Future  Next appt: 5 months.  [name]. [place], [facility] [phone]

## 2021-08-27 LAB — CBC WITH DIFFERENTIAL/PLATELET
Absolute Monocytes: 498 cells/uL (ref 200–950)
Basophils Absolute: 22 cells/uL (ref 0–200)
Basophils Relative: 0.4 %
Eosinophils Absolute: 0 cells/uL — ABNORMAL LOW (ref 15–500)
Eosinophils Relative: 0 %
HCT: 31.9 % — ABNORMAL LOW (ref 35.0–45.0)
Hemoglobin: 10.1 g/dL — ABNORMAL LOW (ref 11.7–15.5)
Lymphs Abs: 963 cells/uL (ref 850–3900)
MCH: 27.6 pg (ref 27.0–33.0)
MCHC: 31.7 g/dL — ABNORMAL LOW (ref 32.0–36.0)
MCV: 87.2 fL (ref 80.0–100.0)
MPV: 9.8 fL (ref 7.5–12.5)
Monocytes Relative: 8.9 %
Neutro Abs: 4116 cells/uL (ref 1500–7800)
Neutrophils Relative %: 73.5 %
Platelets: 276 10*3/uL (ref 140–400)
RBC: 3.66 10*6/uL — ABNORMAL LOW (ref 3.80–5.10)
RDW: 13.3 % (ref 11.0–15.0)
Total Lymphocyte: 17.2 %
WBC: 5.6 10*3/uL (ref 3.8–10.8)

## 2021-08-27 LAB — COMPLETE METABOLIC PANEL WITH GFR
AG Ratio: 0.8 (calc) — ABNORMAL LOW (ref 1.0–2.5)
ALT: 8 U/L (ref 6–29)
AST: 15 U/L (ref 10–35)
Albumin: 3.6 g/dL (ref 3.6–5.1)
Alkaline phosphatase (APISO): 68 U/L (ref 37–153)
BUN/Creatinine Ratio: 20 (calc) (ref 6–22)
BUN: 34 mg/dL — ABNORMAL HIGH (ref 7–25)
CO2: 27 mmol/L (ref 20–32)
Calcium: 9.9 mg/dL (ref 8.6–10.4)
Chloride: 102 mmol/L (ref 98–110)
Creat: 1.66 mg/dL — ABNORMAL HIGH (ref 0.60–0.95)
Globulin: 4.8 g/dL (calc) — ABNORMAL HIGH (ref 1.9–3.7)
Glucose, Bld: 142 mg/dL — ABNORMAL HIGH (ref 65–99)
Potassium: 4.1 mmol/L (ref 3.5–5.3)
Sodium: 138 mmol/L (ref 135–146)
Total Bilirubin: 0.4 mg/dL (ref 0.2–1.2)
Total Protein: 8.4 g/dL — ABNORMAL HIGH (ref 6.1–8.1)
eGFR: 30 mL/min/{1.73_m2} — ABNORMAL LOW (ref >=60)

## 2021-08-29 ENCOUNTER — Other Ambulatory Visit: Payer: Self-pay

## 2021-08-29 ENCOUNTER — Ambulatory Visit: Payer: Medicare Other | Admitting: Occupational Therapy

## 2021-08-29 DIAGNOSIS — I890 Lymphedema, not elsewhere classified: Secondary | ICD-10-CM

## 2021-08-30 NOTE — Therapy (Signed)
[place] MAIN [facility] SERVICES [address] [place], [place], [address] Phone: [phone]   Fax:  [phone]  Occupational Therapy Treatment  Patient Details  Name: [name] MRN: [id number] Date of Birth: 1932-05-31 No data recorded  Encounter Date: 08/29/2021   OT End of Session - 08/30/21 1154     Visit Number 42    Number of Visits 72    Date for OT Re-Evaluation 09/12/21    Authorization Time Period 09/28/20 - 01/03/21    OT Start Time 0300    OT Stop Time 0407    OT Time Calculation (min) 67 min    Activity Tolerance Patient tolerated treatment well;Patient limited by pain;No increased pain    Behavior During Therapy WFL for tasks assessed/performed             Past Medical History:  Diagnosis Date   Anemia, unspecified    Anxiety    Arthritis    Asthma    Delusions ([place])    Depression    Edema 2005   "since put the glass in them"    GERD (gastroesophageal reflux disease)    Lumbago    Lymphedema    Obstructive chronic bronchitis without exacerbation ([place])    Other abnormal glucose    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[place]) 01/28/2877   Systolic heart failure ([place])    EF 35 to 40% per echo January 2015   Unspecified essential hypertension    Unspecified nonpsychotic mental disorder    Unspecified venous (peripheral) insufficiency    Unspecified venous (peripheral) insufficiency    Unspecified vitamin D deficiency     Past Surgical History:  Procedure Laterality Date   APPENDECTOMY     CERVICAL SPINE SURGERY     x 2   CHOLECYSTECTOMY     ESOPHAGOGASTRODUODENOSCOPY N/A 04/05/2015   Procedure: ESOPHAGOGASTRODUODENOSCOPY (EGD);  Surgeon: [name], MD;  Location: [facility];  Service: Endoscopy;  Laterality: N/A;   FLEXIBLE SIGMOIDOSCOPY N/A 04/05/2015   Procedure: FLEXIBLE SIGMOIDOSCOPY;  Surgeon: [name], MD;  Location: [facility];  Service: Endoscopy;  Laterality: N/A;   HAND SURGERY     LUMBAR DISC  SURGERY     PARTIAL HYSTERECTOMY     TONSILLECTOMY      There were no vitals filed for this visit.   Subjective Assessment - 08/30/21 1218     Subjective  [name] presents for OT vist 42/100 to address BLE lymphedema. Pt was last seen on 08/29/21 when we reduced treatment frequency to q 2 qwweeks as she transitioned to self-management phase of CDT. [name] is accompanied by her nephew, [name]. She presents without compression in place. She tells me that she does not wear compression all day. [name] reports they are using compression and providing skin care most weekdays.    Patient is accompanied by: Family member    Pertinent History Chronic obstructive bronchitis, CVI, anxiety , systoloc heart failure, asthma, arthritis, multiple spinal surgeries w/ chronic back pain, DM,    Limitations Difficulty walking, impaired standing tolerance, impaired transfers, decreased joint AROM at toes, ankles and knees, chronic leg swelling , chronic pain. increased infection and falls risks, cognitive impairment, generaized weakness    Repetition Increases Symptoms    Special Tests + [place] bilaterally    Patient Stated Goals reduce swelling and legs feel better    Pain Onset Other (comment)  OT Treatments/Exercises (OP) - 08/30/21 1222       ADLs   ADL Education Given Yes      Manual Therapy   Manual Therapy Edema management;Compression Bandaging    Manual Lymphatic Drainage (MLD) MLD to LLE as established. Pants worn to clinic severly limit access to inguinal LN      for proximal MLD.    Compression Bandaging Multilayer, gradient compression wraps applied to LLE using 1 each of short stretch bandage 8, 10 and 12 cm wide over single layer of fRosidal oam and soft cotton stockinett. from base of toes to tibial tuberosity. CircAid adjustable knee length garment alternative applied to RLE using  ~ 30-40 mmHg.    Other Manual Therapy skin care to BLE with low  ph castor oil concurrent w MLD                    OT Education - 08/30/21 1223     Education provided Yes    Education Details Continued Pt/ CG edu for lymphedema self care  and home program throughout session. Topics include multilayer, gradient compression wrapping, simple self-MLD, therapeutic lymphatic pumping exercises, skin/nail care, risk reduction factors and LE precautions, compression garments/recommendations and wear and care schedule and compression garment donning / doffing using assistive devices. All questions answered to the Pt's satisfaction, and Pt demonstrates understanding by report.    Person(s) Educated Patient;Caregiver(s)    Methods Explanation;Demonstration    Comprehension Verbalized understanding;Returned demonstration;Need further instruction                 OT Long Term Goals - 07/19/21 9470       OT LONG TERM GOAL #1   Title With maximum caregiver assistance Pt will be able to apply knee length, multi-layer, short stretch compression wraps daily from ankle to tibial tuberosity on one leg at a time using correct gradient techniques to return affected limb/s, as closely as possible, to premorbid size and shape, to limit leg pain and infection risk, and to improve safe functional mobility and ADLs performance. 01/03/21: Manufacturer's rep coming next week to assist with fitting adjustable, Velcro style , knee length compression leg wraps. Hopefiullly Pt will be able to manage these more independently, and may be more do-able for caregivers. Drawback is they do not manage foot swelling well.    Baseline dependent    Time 4    Period Days    Status Not Met   CG trained to wrap correctly and to don afdjustable daytime garments, , but Pt remains noncompliant with daily compression.     OT LONG TERM GOAL #2   Title With Maximum caregiver assistance Pt will be able to verbalize signs and symptoms of cellulitis infection and identify lymphedema precautions  using printed resource (modified independence) for reference to decrease infection risk and limit LE progression over time.    Baseline dependent    Time 6    Period Days    Status Achieved      OT LONG TERM GOAL #3   Title With Maximum caregiver assistance Pt >85 % compliant with all daily, LE self-care protocols for home program, including simple self-manual lymphatic drainage (MLD), skin care, lymphatic pumping ther ex, skin care, and compression wraps and garments  to limit LE progression and further functional decline.    Baseline dependent    Time 12    Period Weeks    Status Not Met   Pt lives alone and has no  consistent CG assistance with skin care, compression wraps and / or daytime garments, ther ex and MLD     OT LONG TERM GOAL #4   Baseline dependent    Time 12    Period Weeks    Status Deferred      OT LONG TERM GOAL #5   Title Pt to tolerate compression wraps/ garments in keeping w/ prescribed daily wear regime within 1 week of issue date to progress and retain clinical and functional gains and to limit LE progression.    Baseline dependent    Time 12    Period Weeks    Status Achieved   Met, but non-compliant with daytime compression and wraps     OT LONG TERM GOAL #6   Title Pt to achieve at least 10% LLE limb volume reduction bilaterally during modified Intensive Phase CDT to reduce infection risk, risk of recurrent leg wounds, to improve functional performance of basic and instrumental ADLs, and to limit LE progression.    Baseline dependent    Time 12    Period Weeks    Status Partially Met   04/25/21 BLE clearly more swollen today; by visual assessment I estimate 15-20% increase on L and 10% increase on R from distal legs to toes.Volumetrics not taken today due to time constraints.                  Plan - 08/30/21 1155     Clinical Impression Statement Pt's skin is cracked and dry, but less so than last several visits. Swelling is better controlled  than between more frequent visits. Family members are assisting Pt with skin care and compression wrapping most week days. Pt tells me she doews not keep compression in place all day every day as directed, but more regular compression ( vs 1 x weekly in clinic) appears to be managing swelling soewhat more effectively. Pt tolerated MLD, skin care to BLE below the knees and application of wrap style CircAid compression garment alternatives without increased pain. Cont OT every other week through Nov then reduce to 1 x monthly  as Pt transitions to self-management pahse of CDT with PRN visits for support.    OT Occupational Profile and History Comprehensive Assessment- Review of records and extensive additional review of physical, cognitive, psychosocial history related to current functional performance    Occupational Profile and client history currently impacting functional performance dimensia, inconsistent CG assistance with removing wraps, providing skin care and reapplying compression between clinical visits. S    Occupational performance deficits (Please refer to evaluation for details): ADL's;IADL's;Rest and Sleep;Play;Leisure;Social Participation;Other   body image   Body Structure / Function / Physical Skills ADL;Decreased knowledge of use of DME;Pain;Edema;Scar mobility;Decreased knowledge of precautions;Skin integrity    Rehab Potential Fair    Clinical Decision Making Multiple treatment options, significant modification of task necessary    Comorbidities Affecting Occupational Performance: Presence of comorbidities impacting occupational performance    Modification or Assistance to Complete Evaluation  Max significant modification of tasks or assist is necessary to complete    OT Frequency Other (comment)   every other week   OT Duration 12 weeks   and PRN ongoing to ensure optimal LE management and to support caregiver. Without ongoing skilled OT condition will worsen and further functional  decline is expected   OT Treatment/Interventions Self-care/ADL training;DME and/or AE instruction;Manual lymph drainage;Patient/family education;Compression bandaging;Therapeutic exercise;Manual Therapy;Other (comment)    Plan skin care to decrease infection risk and improve hydration  Recommended Other Services fit with adjustable , alternative compression wraps for optimal independence w/ donning and doffing. Cnsider CircAid with footpieces.    Consulted and Agree with Plan of Care Patient;Family member/caregiver             Patient will benefit from skilled therapeutic intervention in order to improve the following deficits and impairments:   Body Structure / Function / Physical Skills: ADL, Decreased knowledge of use of DME, Pain, Edema, Scar mobility, Decreased knowledge of precautions, Skin integrity       Visit Diagnosis: Lymphedema, not elsewhere classified    Problem List Patient Active Problem List   Diagnosis Date Noted   PVD (peripheral vascular disease) ([place]) 04/27/2021   Medication noncompliance due to cognitive impairment 10/17/2019   Chronic pain of multiple joints 08/01/2017   Age-related osteoporosis without current pathological fracture 08/01/2017   Left knee pain 09/19/2016   Rectal benign neoplasm    Asthma, chronic 04/04/2015   history of Tubular adenoma of colon 04/04/2015   CKD (chronic kidney disease) stage 3, GFR 30-59 ml/min ([facility]) 04/03/2015   Edema 55/00/1642   Chronic systolic heart failure ([place]) 11/20/2013   Lymphedema of lower extremity 10/17/2013   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[place]) 04/04/2013   MGUS (monoclonal gammopathy of unknown significance) 05/15/2012   Vitamin D deficiency 01/29/2010   DERMATITIS 09/30/2009   Paranoid delusion ([place]) 08/02/2008   Obstructive chronic bronchitis ([place]) 08/02/2008   Essential hypertension 11/11/2007   Venous (peripheral) insufficiency 11/11/2007   RENAL CALCULUS 11/11/2007   LOW BACK PAIN, CHRONIC 11/11/2007      Andrey Spearman, MS, OTR/L, CLT-LANA 08/30/21 12:25 PM  [place] MAIN [facility] SERVICES [place], [place], [address] Phone: [phone]   Fax:  [phone]  Name: [name] MRN: [id number] Date of Birth: 05-Mar-1932

## 2021-08-30 NOTE — Patient Instructions (Signed)

## 2021-09-01 ENCOUNTER — Telehealth: Payer: Self-pay | Admitting: *Deleted

## 2021-09-01 DIAGNOSIS — I5022 Chronic systolic (congestive) heart failure: Secondary | ICD-10-CM

## 2021-09-01 DIAGNOSIS — I890 Lymphedema, not elsewhere classified: Secondary | ICD-10-CM

## 2021-09-01 MED ORDER — POTASSIUM CHLORIDE CRYS ER 20 MEQ PO TBCR: 10.0000 meq | EXTENDED_RELEASE_TABLET | Freq: Every day | ORAL | 0 refills | Status: DC

## 2021-09-01 MED ORDER — TORSEMIDE 20 MG PO TABS: 10.0000 mg | ORAL_TABLET | Freq: Every day | ORAL | 1 refills | Status: DC

## 2021-09-01 NOTE — Telephone Encounter (Signed)
-----   Message from Lauree Chandler, NP sent at 08/30/2021  9:40 AM EDT ----- Blood sugar elevated, kidney function has slightly worsened since being on demadex routinely- lets reduce demadex to 10 mg daily AND reduce potassium at 10 meq daily (to replace potassium lost with demadex). To notify office is swelling worsens. We will follow up labs again at upcoming follow up.  Make sure she is staying hydrated.   Blood counts are stable.

## 2021-09-01 NOTE — Telephone Encounter (Signed)
Desma Paganini, Notified and agreed.  Medication list updated.

## 2021-09-05 ENCOUNTER — Ambulatory Visit: Payer: Medicare Other | Admitting: Occupational Therapy

## 2021-09-06 ENCOUNTER — Ambulatory Visit (INDEPENDENT_AMBULATORY_CARE_PROVIDER_SITE_OTHER): Payer: Medicare Other | Admitting: Nurse Practitioner

## 2021-09-06 ENCOUNTER — Telehealth: Payer: Self-pay

## 2021-09-06 ENCOUNTER — Encounter: Payer: Self-pay | Admitting: Nurse Practitioner

## 2021-09-06 ENCOUNTER — Other Ambulatory Visit: Payer: Self-pay

## 2021-09-06 DIAGNOSIS — Z0000 Encounter for general adult medical examination without abnormal findings: Secondary | ICD-10-CM | POA: Diagnosis not present

## 2021-09-06 NOTE — Progress Notes (Signed)
This service is provided via telemedicine  No vital signs collected/recorded due to the encounter was a telemedicine visit.   Location of patient (ex: home, work):  Home  Patient consents to a telephone visit:  Yes, see encounter dated 09/06/2021  Location of the provider (ex: office, home):  [place]  Name of any referring provider:  N/A  Names of all persons participating in the telemedicine service and their role in the encounter:  Sherrie Mustache, Nurse Practitioner, Carroll Kinds, CMA, patient and [name], caretaker.  Time spent on call:  12 minutes with medical assistant

## 2021-09-06 NOTE — Patient Instructions (Signed)
[name] , Thank you for taking time to come for your Medicare Wellness Visit. I appreciate your ongoing commitment to your health goals. Please review the following plan we discussed and let me know if I can assist you in the future.   Screening recommendations/referrals: Colonoscopy aged out Mammogram aged out Bone Density recommended to call and schedule [phone] Recommended yearly ophthalmology/optometry visit for glaucoma screening and checkup Recommended yearly dental visit for hygiene and checkup  Vaccinations: Influenza vaccine up to date Pneumococcal vaccine up to date Tdap vaccine up to date Shingles vaccine RECOMMENDED to get at your local pharmacy   COVID- recommended at local pharmacy.   Advanced directives: on file.   Conditions/risks identified: advance age, fall risk, progressive memory loss.   Next appointment: yearly for awv   Preventive Care 8 Years and Older, Female Preventive care refers to lifestyle choices and visits with your health care provider that can promote health and wellness. What does preventive care include? A yearly physical exam. This is also called an annual well check. Dental exams once or twice a year. Routine eye exams. Ask your health care provider how often you should have your eyes checked. Personal lifestyle choices, including: Daily care of your teeth and gums. Regular physical activity. Eating a healthy diet. Avoiding tobacco and drug use. Limiting alcohol use. Practicing safe sex. Taking low-dose aspirin every day. Taking vitamin and mineral supplements as recommended by your health care provider. What happens during an annual well check? The services and screenings done by your health care provider during your annual well check will depend on your age, overall health, lifestyle risk factors, and family history of disease. Counseling  Your health care provider may ask you questions about your: Alcohol use. Tobacco  use. Drug use. Emotional well-being. Home and relationship well-being. Sexual activity. Eating habits. History of falls. Memory and ability to understand (cognition). Work and work Statistician. Reproductive health. Screening  You may have the following tests or measurements: Height, weight, and BMI. Blood pressure. Lipid and cholesterol levels. These may be checked every 5 years, or more frequently if you are over 1 years old. Skin check. Lung cancer screening. You may have this screening every year starting at age 71 if you have a 30-pack-year history of smoking and currently smoke or have quit within the past 15 years. Fecal occult blood test (FOBT) of the stool. You may have this test every year starting at age 56. Flexible sigmoidoscopy or colonoscopy. You may have a sigmoidoscopy every 5 years or a colonoscopy every 10 years starting at age 65. Hepatitis C blood test. Hepatitis B blood test. Sexually transmitted disease (STD) testing. Diabetes screening. This is done by checking your blood sugar (glucose) after you have not eaten for a while (fasting). You may have this done every 1-3 years. Bone density scan. This is done to screen for osteoporosis. You may have this done starting at age 29. Mammogram. This may be done every 1-2 years. Talk to your health care provider about how often you should have regular mammograms. Talk with your health care provider about your test results, treatment options, and if necessary, the need for more tests. Vaccines  Your health care provider may recommend certain vaccines, such as: Influenza vaccine. This is recommended every year. Tetanus, diphtheria, and acellular pertussis (Tdap, Td) vaccine. You may need a Td booster every 10 years. Zoster vaccine. You may need this after age 51. Pneumococcal 13-valent conjugate (PCV13) vaccine. One dose is recommended after  age 45. Pneumococcal polysaccharide (PPSV23) vaccine. One dose is recommended  after age 60. Talk to your health care provider about which screenings and vaccines you need and how often you need them. This information is not intended to replace advice given to you by your health care provider. Make sure you discuss any questions you have with your health care provider. Document Released: 11/12/2015 Document Revised: 07/05/2016 Document Reviewed: 08/17/2015 Elsevier Interactive Patient Education  2017 [place] Prevention in the Home Falls can cause injuries. They can happen to people of all ages. There are many things you can do to make your home safe and to help prevent falls. What can I do on the outside of my home? Regularly fix the edges of walkways and driveways and fix any cracks. Remove anything that might make you trip as you walk through a door, such as a raised step or threshold. Trim any bushes or trees on the path to your home. Use bright outdoor lighting. Clear any walking paths of anything that might make someone trip, such as rocks or tools. Regularly check to see if handrails are loose or broken. Make sure that both sides of any steps have handrails. Any raised decks and porches should have guardrails on the edges. Have any leaves, snow, or ice cleared regularly. Use sand or salt on walking paths during winter. Clean up any spills in your garage right away. This includes oil or grease spills. What can I do in the bathroom? Use night lights. Install grab bars by the toilet and in the tub and shower. Do not use towel bars as grab bars. Use non-skid mats or decals in the tub or shower. If you need to sit down in the shower, use a plastic, non-slip stool. Keep the floor dry. Clean up any water that spills on the floor as soon as it happens. Remove soap buildup in the tub or shower regularly. Attach bath mats securely with double-sided non-slip rug tape. Do not have throw rugs and other things on the floor that can make you trip. What can I do  in the bedroom? Use night lights. Make sure that you have a light by your bed that is easy to reach. Do not use any sheets or blankets that are too big for your bed. They should not hang down onto the floor. Have a firm chair that has side arms. You can use this for support while you get dressed. Do not have throw rugs and other things on the floor that can make you trip. What can I do in the kitchen? Clean up any spills right away. Avoid walking on wet floors. Keep items that you use a lot in easy-to-reach places. If you need to reach something above you, use a strong step stool that has a grab bar. Keep electrical cords out of the way. Do not use floor polish or wax that makes floors slippery. If you must use wax, use non-skid floor wax. Do not have throw rugs and other things on the floor that can make you trip. What can I do with my stairs? Do not leave any items on the stairs. Make sure that there are handrails on both sides of the stairs and use them. Fix handrails that are broken or loose. Make sure that handrails are as long as the stairways. Check any carpeting to make sure that it is firmly attached to the stairs. Fix any carpet that is loose or worn. Avoid having throw rugs  at the top or bottom of the stairs. If you do have throw rugs, attach them to the floor with carpet tape. Make sure that you have a light switch at the top of the stairs and the bottom of the stairs. If you do not have them, ask someone to add them for you. What else can I do to help prevent falls? Wear shoes that: Do not have high heels. Have rubber bottoms. Are comfortable and fit you well. Are closed at the toe. Do not wear sandals. If you use a stepladder: Make sure that it is fully opened. Do not climb a closed stepladder. Make sure that both sides of the stepladder are locked into place. Ask someone to hold it for you, if possible. Clearly mark and make sure that you can see: Any grab bars or  handrails. First and last steps. Where the edge of each step is. Use tools that help you move around (mobility aids) if they are needed. These include: Canes. Walkers. Scooters. Crutches. Turn on the lights when you go into a dark area. Replace any light bulbs as soon as they burn out. Set up your furniture so you have a clear path. Avoid moving your furniture around. If any of your floors are uneven, fix them. If there are any pets around you, be aware of where they are. Review your medicines with your doctor. Some medicines can make you feel dizzy. This can increase your chance of falling. Ask your doctor what other things that you can do to help prevent falls. This information is not intended to replace advice given to you by your health care provider. Make sure you discuss any questions you have with your health care provider. Document Released: 08/12/2009 Document Revised: 03/23/2016 Document Reviewed: 11/20/2014 Elsevier Interactive Patient Education  2017 [employer].

## 2021-09-06 NOTE — Telephone Encounter (Signed)
[name], [name] are scheduled for a virtual visit with your provider today.    Just as we do with appointments in the office, we must obtain your consent to participate.  Your consent will be active for this visit and any virtual visit you may have with one of our providers in the next 365 days.    If you have a MyChart account, I can also send a copy of this consent to you electronically.  All virtual visits are billed to your insurance company just like a traditional visit in the office.  As this is a virtual visit, video technology does not allow for your provider to perform a traditional examination.  This may limit your provider's ability to fully assess your condition.  If your provider identifies any concerns that need to be evaluated in person or the need to arrange testing such as labs, EKG, etc, we will make arrangements to do so.    Although advances in technology are sophisticated, we cannot ensure that it will always work on either your end or our end.  If the connection with a video visit is poor, we may have to switch to a telephone visit.  With either a video or telephone visit, we are not always able to ensure that we have a secure connection.   I need to obtain your verbal consent now.   Are you willing to proceed with your visit today?   [name] has provided verbal consent on 09/06/2021 for a virtual visit (video or telephone).   Carroll Kinds, CMA 09/06/2021  2:10 PM

## 2021-09-06 NOTE — Progress Notes (Signed)
Subjective:   [name] is a 87 y.o. female who presents for Medicare Annual (Subsequent) preventive examination.  Review of Systems     Cardiac Risk Factors include: advanced age (>56men, >22 women);hypertension;sedentary lifestyle     Objective:    Today's Vitals   09/06/21 1416  PainSc: 3    There is no height or weight on file to calculate BMI.  Advanced Directives 09/06/2021 08/26/2021 07/01/2020 06/18/2020 05/25/2020 05/25/2020 05/21/2020  Does Patient Have a Medical Advance Directive? Yes Yes No No Yes Yes Yes  Type of Advance Directive Healthcare Power of [place] -  Does patient want to make changes to medical advance directive? No - Patient declined No - Patient declined - - No - Patient declined No - Patient declined No - Patient declined  Copy of [place] in Chart? - Yes - validated most recent copy scanned in chart (See row information) - - Yes - validated most recent copy scanned in chart (See row information) - -  Would patient like information on creating a medical advance directive? - - No - Patient declined No - Patient declined - - -    Current Medications (verified) Outpatient Encounter Medications as of 09/06/2021  Medication Sig   acetaminophen (TYLENOL) 650 MG CR tablet Take 650 mg by mouth as needed for pain.   albuterol (PROAIR HFA) 108 (90 Base) MCG/ACT inhaler INHALE 2 PUFFS EVERY 4 HOURS AS NEEDED FOR WHEEZING   carvedilol (COREG) 6.25 MG tablet TAKE 1 TABLET BY MOUTH TWICE DAILY IN LUNCH AND SUPPER   denosumab (PROLIA) 60 MG/ML SOSY injection Inject 60 mg into the skin every 6 (six) months.   FEROSUL 325 (65 Fe) MG tablet TAKE 1 TABLET(325 MG) BY MOUTH DAILY WITH BREAKFAST   fluticasone-salmeterol (ADVAIR HFA) 115-21 MCG/ACT inhaler INHALE 2 PUFFS BY MOUTH TWICE DAILY   hydrALAZINE (APRESOLINE) 50 MG tablet TAKE 1 TABLET BY MOUTH TWICE DAILY  AT 8 AM AND 10 PM   OLANZapine (ZYPREXA) 5 MG tablet TAKE 1 TABLET(5 MG) BY MOUTH AT BEDTIME   omeprazole (PRILOSEC) 20 MG capsule TAKE 1 CAPSULE(20 MG) BY MOUTH DAILY   potassium chloride SA (KLOR-CON) 20 MEQ tablet Take 0.5 tablets (10 mEq total) by mouth daily.   torsemide (DEMADEX) 20 MG tablet Take 0.5 tablets (10 mg total) by mouth daily.   cetirizine (ZYRTEC) 10 MG tablet Take 1 tablet (10 mg total) by mouth daily. (Patient not taking: Reported on 09/06/2021)   No facility-administered encounter medications on file as of 09/06/2021.    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   Systolic heart failure (HCC)    EF 35 to 40% per echo January 2015   Unspecified essential hypertension    Unspecified nonpsychotic mental disorder    Unspecified venous (peripheral) insufficiency    Unspecified venous (peripheral) insufficiency    Unspecified vitamin D deficiency    Past Surgical History:  Procedure Laterality Date   APPENDECTOMY     CERVICAL SPINE SURGERY     x 2   CHOLECYSTECTOMY     ESOPHAGOGASTRODUODENOSCOPY N/A 04/05/2015   Procedure: ESOPHAGOGASTRODUODENOSCOPY (EGD);  Surgeon: [name], MD;  Location: [facility];  Service: Endoscopy;  Laterality: N/A;   FLEXIBLE SIGMOIDOSCOPY N/A 04/05/2015   Procedure: FLEXIBLE SIGMOIDOSCOPY;  Surgeon: [name], MD;  Location: [facility];  Service: Endoscopy;  Laterality: N/A;   HAND SURGERY     LUMBAR DISC SURGERY     PARTIAL HYSTERECTOMY     TONSILLECTOMY     Family History  Problem Relation Age of Onset   Diabetes Mother     Arthritis Father    Diabetes Paternal Grandfather        entire family on both sides   Heart disease Maternal Aunt        entire family of both sides   Cancer Paternal Aunt        type unknown   Diabetes Sister    Breast cancer Cousin    Social History   Socioeconomic History   Marital status: Widowed    Spouse name: Not on file   Number of children: 0   Years of education: Not on file   Highest education level: Not on file  Occupational History   Occupation: retired  Tobacco Use   Smoking status: Never   Smokeless tobacco: Never  Vaping Use   Vaping Use: Never used  Substance and Sexual Activity   Alcohol use: No   Drug use: No   Sexual activity: Not Currently  Other Topics Concern   Not on file  Social History Narrative   Diet? Good, low appetite      Do you drink/eat things with caffeine? no      Marital status?                  widowed                  What year were you married?      Do you live in a house, apartment, assisted living, condo, trailer, etc.? house      Is it one or more stories? 1 story      How many persons live in your home? Only me      Do you have any pets in your home? (please list) no      Current or past profession: lab tech      Do you exercise?        some                              Type & how often? Foot rolls, some walking      Do you have a living will? yes      Do you have a DNR form?    no                              If not, do you want to discuss one?      Do you have signed POA/HPOA for forms? yes   Social Determinants of Health   Financial Resource Strain: Not on file  Food Insecurity: Not on file  Transportation Needs: Not on file  Physical Activity: Not on file  Stress: Not on file  Social Connections: Not on file  Tobacco Counseling Counseling given: Not Answered   Clinical Intake:  Pre-visit preparation completed: Yes  Pain : 0-10 Pain Score: 3  Pain Type: Chronic pain Pain Location: Back Pain  Descriptors / Indicators: Constant Pain Onset: More than a month ago     BMI - recorded: 27 Nutritional Risks: None Diabetes: No  How often do you need to have someone help you when you read instructions, pamphlets, or other written materials from your doctor or pharmacy?: 5 - Always  Diabetic?no         Activities of Daily Living In your present state of health, do you have any difficulty performing the following activities: 09/06/2021  Walking or climbing stairs? Y  Comment does not climb stairs  Dressing or bathing? N  Doing errands, shopping? Y  Comment does not Physiological scientist and eating ? N  Using the Toilet? N  In the past six months, have you accidently leaked urine? N  Do you have problems with loss of bowel control? N  Managing your Medications? Y  Comment nephew  Managing your Finances? Y  Comment nephew helps  Housekeeping or managing your Housekeeping? Y  Comment has help with housekeeping  Some recent data might be hidden    Patient Care Team: Lauree Chandler, NP as PCP - General (Geriatric Medicine) [name], MD (Pulmonary Disease) Jacolyn Reedy, MD as Consulting Physician (Cardiology)  Indicate any recent Medical Services you may have received from other than [facility] providers in the past year (date may be approximate).     Assessment:   This is a routine wellness examination for [place].  Hearing/Vision screen Hearing Screening - Comments:: Patient has no hearing problems. Vision Screening - Comments:: Patient has no vision problems. Patient does not remember when last exam was. Doesn't have eye doctor.  Dietary issues and exercise activities discussed: Current Exercise Habits: The patient does not participate in regular exercise at present   Goals Addressed   None    Depression Screen PHQ 2/9 Scores 09/06/2021 05/25/2020 05/24/2020 05/05/2019 03/29/2018 12/18/2016 11/24/2016  PHQ - 2 Score 0 0 0 0 0 0 0    Fall Risk Fall Risk   09/06/2021 08/26/2021 04/27/2021 09/27/2020 05/25/2020  Falls in the past year? 0 0 0 0 0  Number falls in past yr: 0 0 0 0 0  Injury with Fall? 0 0 0 0 0  Comment - - - - -  Risk Factor Category  - - - - -  Risk for fall due to : No Fall Risks No Fall Risks No Fall Risks - -  Risk for fall due to: Comment - - - - -  Follow up Falls evaluation completed Falls evaluation completed Falls evaluation completed - -    FALL RISK PREVENTION PERTAINING TO THE HOME:  Any stairs in or around the home? Yes  If so, are there any without handrails? No  Home free of loose throw rugs in walkways, pet beds, electrical cords, etc? Yes  Adequate lighting in your home to reduce risk of falls? Yes   ASSISTIVE DEVICES UTILIZED TO PREVENT FALLS:  Life alert? Yes  Use of a cane, walker or w/c? Yes  Grab bars in the bathroom? Yes  Shower chair or bench in shower? Yes  Elevated toilet seat or a handicapped toilet? Yes   TIMED UP AND GO:  Was the test performed? No .    Cognitive Function: MMSE - Mini Mental State Exam 05/05/2019 03/29/2018  11/24/2016  Orientation to time 3 5 5   Orientation to Place 3 5 4   Registration 3 3 3   Attention/ Calculation 0 2 0  Recall 2 1 3   Language- name 2 objects 2 2 2   Language- repeat 1 1 1   Language- follow 3 step command 3 3 1   Language- read & follow direction 0 1 1  Write a sentence 0 0 0  Copy design 0 1 0  Total score 17 24 20      6CIT Screen 09/06/2021 05/25/2020  What Year? 4 points 4 points  What month? 0 points 3 points  What time? 0 points 0 points  Count back from 20 4 points 4 points  Months in reverse 4 points 4 points  Repeat phrase 10 points 10 points  Total Score 22 25    Immunizations Immunization History  Administered Date(s) Administered   Fluad Quad(high Dose 65+) 06/24/2019, 09/27/2020, 08/26/2021   Influenza Split 07/31/2011   Influenza Whole 09/30/2009, 07/30/2010   Influenza,inj,Quad PF,6+ Mos 08/13/2015, 07/03/2017, 08/16/2018    Influenza-Unspecified 07/30/2016   PFIZER(Purple Top)SARS-COV-2 Vaccination 12/10/2019, 12/31/2019   Pneumococcal Conjugate-13 11/24/2016   Pneumococcal Polysaccharide-23 08/16/2018   Tdap 04/07/2015   Varicella 08/25/2011    TDAP status: Up to date  Flu Vaccine status: Up to date  Pneumococcal vaccine status: Up to date  Covid-19 vaccine status: Information provided on how to obtain vaccines.   Qualifies for Shingles Vaccine? Yes   Zostavax completed No   Shingrix Completed?: No.    Education has been provided regarding the importance of this vaccine. Patient has been advised to call insurance company to determine out of pocket expense if they have not yet received this vaccine. Advised may also receive vaccine at local pharmacy or Health Dept. Verbalized acceptance and understanding.  Screening Tests Health Maintenance  Topic Date Due   Zoster Vaccines- Shingrix (1 of 2) Never done   COVID-19 Vaccine (3 - Pfizer risk series) 01/28/2020   TETANUS/TDAP  04/06/2025   Pneumonia Vaccine 69+ Years old  Completed   INFLUENZA VACCINE  Completed   DEXA SCAN  Completed   HPV VACCINES  Aged Out    Health Maintenance  Health Maintenance Due  Topic Date Due   Zoster Vaccines- Shingrix (1 of 2) Never done   COVID-19 Vaccine (3 - Pfizer risk series) 01/28/2020    Colorectal cancer screening: No longer required.   Mammogram status: No longer required due to age.  Bone Density status: Ordered  . Pt provided with contact info and advised to call to schedule appt.  Lung Cancer Screening: (Low Dose CT Chest recommended if Age 84-80 years, 30 pack-year currently smoking OR have quit w/in 15years.) does not qualify.   Lung Cancer Screening Referral: na  Additional Screening:  Hepatitis C Screening: does not qualify; Completed   Vision Screening: Recommended annual ophthalmology exams for early detection of glaucoma and other disorders of the eye. Is the patient up to date with  their annual eye exam?  Yes  Who is the provider or what is the name of the office in which the patient attends annual eye exams?  If pt is not established with a provider, would they like to be referred to a provider to establish care? No .   Dental Screening: Recommended annual dental exams for proper oral hygiene  Community Resource Referral / Chronic Care Management: CRR required this visit?  No   CCM required this visit?  No  Plan:     I have personally reviewed and noted the following in the patient's chart:   Medical and social history Use of alcohol, tobacco or illicit drugs  Current medications and supplements including opioid prescriptions.  Functional ability and status Nutritional status Physical activity Advanced directives List of other physicians Hospitalizations, surgeries, and ER visits in previous 12 months Vitals Screenings to include cognitive, depression, and falls Referrals and appointments  In addition, I have reviewed and discussed with patient certain preventive protocols, quality metrics, and best practice recommendations. A written personalized care plan for preventive services as well as general preventive health recommendations were provided to patient.     Lauree Chandler, NP   09/06/2021    Virtual Visit via Telephone Note  I connected withNAME@ on 09/06/21 at  2:15 PM EST by telephone and verified that I am speaking with the correct person using two identifiers.  Location: Patient: home Provider: [facility]   I discussed the limitations, risks, security and privacy concerns of performing an evaluation and management service by telephone and the availability of in person appointments. I also discussed with the patient that there may be a patient responsible charge related to this service. The patient expressed understanding and agreed to proceed.   I discussed the assessment and treatment plan with the patient. The patient was  provided an opportunity to ask questions and all were answered. The patient agreed with the plan and demonstrated an understanding of the instructions.   The patient was advised to call back or seek an in-person evaluation if the symptoms worsen or if the condition fails to improve as anticipated.  I provided 15 minutes of non-face-to-face time during this encounter.  [name]. Harle Battiest Avs printed and mailed

## 2021-09-12 ENCOUNTER — Ambulatory Visit: Payer: Medicare Other | Attending: Family | Admitting: Occupational Therapy

## 2021-09-12 ENCOUNTER — Other Ambulatory Visit: Payer: Self-pay

## 2021-09-12 DIAGNOSIS — I890 Lymphedema, not elsewhere classified: Secondary | ICD-10-CM | POA: Insufficient documentation

## 2021-09-12 NOTE — Therapy (Signed)
[place] MAIN [facility] SERVICES [address] [place], [place], [address] Phone: [phone]   Fax:  [phone]  Occupational Therapy Treatment  Patient Details  Name: [name] MRN: [id number] Date of Birth: September 06, 1932 No data recorded  Encounter Date: 09/12/2021   OT End of Session - 09/12/21 1517     Visit Number 43    Number of Visits 72    Date for OT Re-Evaluation 09/12/21    Authorization Time Period 09/28/20 - 01/03/21    OT Start Time 0310    OT Stop Time 0410    OT Time Calculation (min) 60 min    Activity Tolerance Patient tolerated treatment well;Patient limited by pain;No increased pain    Behavior During Therapy WFL for tasks assessed/performed             Past Medical History:  Diagnosis Date   Anemia, unspecified    Anxiety    Arthritis    Asthma    Delusions ([place])    Depression    Edema 2005   "since put the glass in them"    GERD (gastroesophageal reflux disease)    Lumbago    Lymphedema    Obstructive chronic bronchitis without exacerbation ([place])    Other abnormal glucose    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[place]) 12/31/74   Systolic heart failure ([place])    EF 35 to 40% per echo January 2015   Unspecified essential hypertension    Unspecified nonpsychotic mental disorder    Unspecified venous (peripheral) insufficiency    Unspecified venous (peripheral) insufficiency    Unspecified vitamin D deficiency     Past Surgical History:  Procedure Laterality Date   APPENDECTOMY     CERVICAL SPINE SURGERY     x 2   CHOLECYSTECTOMY     ESOPHAGOGASTRODUODENOSCOPY N/A 04/05/2015   Procedure: ESOPHAGOGASTRODUODENOSCOPY (EGD);  Surgeon: [name], MD;  Location: [facility];  Service: Endoscopy;  Laterality: N/A;   FLEXIBLE SIGMOIDOSCOPY N/A 04/05/2015   Procedure: FLEXIBLE SIGMOIDOSCOPY;  Surgeon: [name], MD;  Location: [facility];  Service: Endoscopy;  Laterality: N/A;   HAND SURGERY     LUMBAR DISC  SURGERY     PARTIAL HYSTERECTOMY     TONSILLECTOMY      There were no vitals filed for this visit.   Subjective Assessment - 09/12/21 1519     Subjective  Pt Ms. Hirschman presents for OT vist 42/100 to address BLE lymphedema. Pt was last seen on 08/29/21 when we reduced treatment frequency to q 2 qwweeks as she transitioned to self-management phase of CDT. Ms. [place] is accompanied by her nephew, [name]. She presents without compression in place. She tells me that she does not wear compression all day. [name] reports they are using compression and providing skin care most weekdays.    Patient is accompanied by: Family member    Pertinent History Chronic obstructive bronchitis, CVI, anxiety , systoloc heart failure, asthma, arthritis, multiple spinal surgeries w/ chronic back pain, DM,    Limitations Difficulty walking, impaired standing tolerance, impaired transfers, decreased joint AROM at toes, ankles and knees, chronic leg swelling , chronic pain. increased infection and falls risks, cognitive impairment, generaized weakness    Repetition Increases Symptoms    Special Tests + Stemmer bilaterally    Patient Stated Goals reduce swelling and legs feel better    Pain Onset Other (comment)  OT Treatments/Exercises (OP) - 09/12/21 1613       ADLs   ADL Education Given Yes      Manual Therapy   Manual Therapy Edema management;Compression Bandaging    Manual Lymphatic Drainage (MLD) MLD to LLE as established. Pants worn to clinic severly limit access to inguinal LN      for proximal MLD.    Compression Bandaging Multilayer, gradient compression wraps applied to LLE using 1 each of short stretch bandage 8, 10 and 12 cm wide over single layer of fRosidal oam and soft cotton stockinett. from base of toes to tibial tuberosity. CircAid adjustable knee length garment alternative applied to RLE using  ~ 30-40 mmHg.    Other Manual Therapy skin care to BLE with low  ph castor oil concurrent w MLD                    OT Education - 09/12/21 1614     Education provided Yes    Education Details Continued Pt/ CG edu for lymphedema self care  and home program throughout session. Topics include multilayer, gradient compression wrapping, simple self-MLD, therapeutic lymphatic pumping exercises, skin/nail care, risk reduction factors and LE precautions, compression garments/recommendations and wear and care schedule and compression garment donning / doffing using assistive devices. All questions answered to the Pt's satisfaction, and Pt demonstrates understanding by report.    Person(s) Educated Patient;Caregiver(s)    Methods Explanation;Demonstration    Comprehension Verbalized understanding;Returned demonstration;Need further instruction                 OT Long Term Goals - 07/19/21 8338       OT LONG TERM GOAL #1   Title With maximum caregiver assistance Pt will be able to apply knee length, multi-layer, short stretch compression wraps daily from ankle to tibial tuberosity on one leg at a time using correct gradient techniques to return affected limb/s, as closely as possible, to premorbid size and shape, to limit leg pain and infection risk, and to improve safe functional mobility and ADLs performance. 01/03/21: Manufacturer's rep coming next week to assist with fitting adjustable, Velcro style , knee length compression leg wraps. Hopefiullly Pt will be able to manage these more independently, and may be more do-able for caregivers. Drawback is they do not manage foot swelling well.    Baseline dependent    Time 4    Period Days    Status Not Met   CG trained to wrap correctly and to don afdjustable daytime garments, , but Pt remains noncompliant with daily compression.     OT LONG TERM GOAL #2   Title With Maximum caregiver assistance Pt will be able to verbalize signs and symptoms of cellulitis infection and identify lymphedema precautions  using printed resource (modified independence) for reference to decrease infection risk and limit LE progression over time.    Baseline dependent    Time 6    Period Days    Status Achieved      OT LONG TERM GOAL #3   Title With Maximum caregiver assistance Pt >85 % compliant with all daily, LE self-care protocols for home program, including simple self-manual lymphatic drainage (MLD), skin care, lymphatic pumping ther ex, skin care, and compression wraps and garments  to limit LE progression and further functional decline.    Baseline dependent    Time 12    Period Weeks    Status Not Met   Pt lives alone and has no  consistent CG assistance with skin care, compression wraps and / or daytime garments, ther ex and MLD     OT LONG TERM GOAL #4   Baseline dependent    Time 12    Period Weeks    Status Deferred      OT LONG TERM GOAL #5   Title Pt to tolerate compression wraps/ garments in keeping w/ prescribed daily wear regime within 1 week of issue date to progress and retain clinical and functional gains and to limit LE progression.    Baseline dependent    Time 12    Period Weeks    Status Achieved   Met, but non-compliant with daytime compression and wraps     OT LONG TERM GOAL #6   Title Pt to achieve at least 10% LLE limb volume reduction bilaterally during modified Intensive Phase CDT to reduce infection risk, risk of recurrent leg wounds, to improve functional performance of basic and instrumental ADLs, and to limit LE progression.    Baseline dependent    Time 12    Period Weeks    Status Partially Met   04/25/21 BLE clearly more swollen today; by visual assessment I estimate 15-20% increase on L and 10% increase on R from distal legs to toes.Volumetrics not taken today due to time constraints.                  Plan - 09/12/21 1609     Clinical Impression Statement Pt's skin below the knees is cracked and dry and peeling/ flaking again today indicating ongoing  fluctuation, but swelling is similar to last visit suggesting stability. While skin condition is essentiaslly unchanged, and swelling appears stable, cooler temperatures ( vs intense summer heat) is more friendly to lymphedema. Pt tolerated skin care throughout RLE MLD. Applied BLE knee length CircAids as established. R leg Skin hydration much improved at end of session. Cont every other week thru end of year. Then consider further reducing frequency.    OT Occupational Profile and History Comprehensive Assessment- Review of records and extensive additional review of physical, cognitive, psychosocial history related to current functional performance    Occupational Profile and client history currently impacting functional performance dimensia, inconsistent CG assistance with removing wraps, providing skin care and reapplying compression between clinical visits. S    Occupational performance deficits (Please refer to evaluation for details): ADL's;IADL's;Rest and Sleep;Play;Leisure;Social Participation;Other   body image   Body Structure / Function / Physical Skills ADL;Decreased knowledge of use of DME;Pain;Edema;Scar mobility;Decreased knowledge of precautions;Skin integrity    Rehab Potential Fair    Clinical Decision Making Multiple treatment options, significant modification of task necessary    Comorbidities Affecting Occupational Performance: Presence of comorbidities impacting occupational performance    Modification or Assistance to Complete Evaluation  Max significant modification of tasks or assist is necessary to complete    OT Frequency Other (comment)   every other week   OT Duration 12 weeks   and PRN ongoing to ensure optimal LE management and to support caregiver. Without ongoing skilled OT condition will worsen and further functional decline is expected   OT Treatment/Interventions Self-care/ADL training;DME and/or AE instruction;Manual lymph drainage;Patient/family education;Compression  bandaging;Therapeutic exercise;Manual Therapy;Other (comment)    Plan skin care to decrease infection risk and improve hydration    Recommended Other Services fit with adjustable , alternative compression wraps for optimal independence w/ donning and doffing. Cnsider CircAid with footpieces.    Consulted and Agree with Plan of Care Patient;Family  member/caregiver             Patient will benefit from skilled therapeutic intervention in order to improve the following deficits and impairments:   Body Structure / Function / Physical Skills: ADL, Decreased knowledge of use of DME, Pain, Edema, Scar mobility, Decreased knowledge of precautions, Skin integrity       Visit Diagnosis: Lymphedema, not elsewhere classified    Problem List Patient Active Problem List   Diagnosis Date Noted   PVD (peripheral vascular disease) ([place]) 04/27/2021   Medication noncompliance due to cognitive impairment 10/17/2019   Chronic pain of multiple joints 08/01/2017   Age-related osteoporosis without current pathological fracture 08/01/2017   Left knee pain 09/19/2016   Rectal benign neoplasm    Asthma, chronic 04/04/2015   history of Tubular adenoma of colon 04/04/2015   CKD (chronic kidney disease) stage 3, GFR 30-59 ml/min (HCC) 04/03/2015   Edema 14/15/9733   Chronic systolic heart failure ([place]) 11/20/2013   Lymphedema of lower extremity 10/17/2013   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[place]) 04/04/2013   MGUS (monoclonal gammopathy of unknown significance) 05/15/2012   Vitamin D deficiency 01/29/2010   DERMATITIS 09/30/2009   Paranoid delusion ([place]) 08/02/2008   Obstructive chronic bronchitis ([place]) 08/02/2008   Essential hypertension 11/11/2007   Venous (peripheral) insufficiency 11/11/2007   RENAL CALCULUS 11/11/2007   LOW BACK PAIN, CHRONIC 11/11/2007    Andrey Spearman, MS, OTR/L, CLT-LANA 09/12/21 4:15 PM   [place] MAIN [facility] SERVICES [place], [place], [address] Phone: [phone]   Fax:  [phone]  Name: KIERRIA FEIGENBAUM MRN: [id number] Date of Birth: [birth year]

## 2021-09-12 NOTE — Patient Instructions (Signed)

## 2021-09-19 ENCOUNTER — Encounter: Payer: Medicare Other | Admitting: Occupational Therapy

## 2021-09-20 ENCOUNTER — Other Ambulatory Visit: Payer: Self-pay | Admitting: Nurse Practitioner

## 2021-09-20 DIAGNOSIS — D509 Iron deficiency anemia, unspecified: Secondary | ICD-10-CM

## 2021-09-26 ENCOUNTER — Other Ambulatory Visit: Payer: Self-pay

## 2021-09-26 ENCOUNTER — Ambulatory Visit: Payer: Medicare Other | Admitting: Occupational Therapy

## 2021-09-26 DIAGNOSIS — I890 Lymphedema, not elsewhere classified: Secondary | ICD-10-CM

## 2021-09-26 NOTE — Therapy (Signed)
[place] MAIN [facility] SERVICES [address] [place], [place], [address] Phone: [phone]   Fax:  [phone]  Occupational Therapy Treatment  Patient Details  Name: [name] MRN: [id number] Date of Birth: [birth year] No data recorded  Encounter Date: 09/26/2021   OT End of Session - 09/26/21 1517     Visit Number 44    Number of Visits 52    Date for OT Re-Evaluation 12/25/21    Authorization Time Period 09/28/20 - 01/03/21    OT Start Time 0310    OT Stop Time 0410    OT Time Calculation (min) 60 min    Activity Tolerance Patient tolerated treatment well;Patient limited by pain;No increased pain    Behavior During Therapy WFL for tasks assessed/performed             Past Medical History:  Diagnosis Date   Anemia, unspecified    Anxiety    Arthritis    Asthma    Delusions ([place])    Depression    Edema 2005   "since put the glass in them"    GERD (gastroesophageal reflux disease)    Lumbago    Lymphedema    Obstructive chronic bronchitis without exacerbation ([place])    Other abnormal glucose    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[place]) 06/06/8675   Systolic heart failure ([place])    EF 35 to 40% per echo January 2015   Unspecified essential hypertension    Unspecified nonpsychotic mental disorder    Unspecified venous (peripheral) insufficiency    Unspecified venous (peripheral) insufficiency    Unspecified vitamin D deficiency     Past Surgical History:  Procedure Laterality Date   APPENDECTOMY     CERVICAL SPINE SURGERY     x 2   CHOLECYSTECTOMY     ESOPHAGOGASTRODUODENOSCOPY N/A 04/05/2015   Procedure: ESOPHAGOGASTRODUODENOSCOPY (EGD);  Surgeon: [name], MD;  Location: [facility];  Service: Endoscopy;  Laterality: N/A;   FLEXIBLE SIGMOIDOSCOPY N/A 04/05/2015   Procedure: FLEXIBLE SIGMOIDOSCOPY;  Surgeon: [name], MD;  Location: [facility];  Service: Endoscopy;  Laterality: N/A;   HAND SURGERY     LUMBAR DISC  SURGERY     PARTIAL HYSTERECTOMY     TONSILLECTOMY      There were no vitals filed for this visit.   Subjective Assessment - 09/26/21 1518     Subjective  Pt Ms. Lechtenberg presents for OT vist 43/100 to address BLE lymphedema. Pt was last seen on 11/14//22 when we reduced treatment frequency to q 2 qweeks as she transitioned to self-management phase of CDT. Ms. Curvin is accompanied by her nephew, [name]. She presents without compression in place. She tells me that she does not wear compression all day. [name] reports they are using compression and providing skin care daily during the week.    Patient is accompanied by: Family member    Pertinent History Chronic obstructive bronchitis, CVI, anxiety , systoloc heart failure, asthma, arthritis, multiple spinal surgeries w/ chronic back pain, DM,    Limitations Difficulty walking, impaired standing tolerance, impaired transfers, decreased joint AROM at toes, ankles and knees, chronic leg swelling , chronic pain. increased infection and falls risks, cognitive impairment, generaized weakness    Repetition Increases Symptoms    Special Tests + Stemmer bilaterally    Patient Stated Goals reduce swelling and legs feel better    Pain Onset Other (comment)  OT Treatments/Exercises (OP) - 09/26/21 1614       ADLs   ADL Education Given Yes      Manual Therapy   Manual Therapy Edema management;Compression Bandaging    Manual Lymphatic Drainage (MLD) MLD to LLE as established. Pants worn to clinic severly limit access to inguinal LN      for proximal MLD.    Compression Bandaging Multilayer, gradient compression wraps applied to LLE using 1 each of short stretch bandage 8, 10 and 12 cm wide over single layer of fRosidal oam and soft cotton stockinett. from base of toes to tibial tuberosity. CircAid adjustable knee length garment alternative applied to RLE using  ~ 30-40 mmHg.    Other Manual Therapy skin care to BLE  with low ph castor oil concurrent w MLD                    OT Education - 09/26/21 1613     Education provided Yes    Education Details Continued Pt/ CG edu for lymphedema self care  and home program throughout session. Topics include multilayer, gradient compression wrapping, simple self-MLD, therapeutic lymphatic pumping exercises, skin/nail care, risk reduction factors and LE precautions, compression garments/recommendations and wear and care schedule and compression garment donning / doffing using assistive devices. All questions answered to the Pt's satisfaction, and Pt demonstrates understanding by report.    Person(s) Educated Patient;Caregiver(s)    Methods Explanation;Demonstration    Comprehension Verbalized understanding;Returned demonstration;Need further instruction                 OT Long Term Goals - 07/19/21 8185       OT LONG TERM GOAL #1   Title With maximum caregiver assistance Pt will be able to apply knee length, multi-layer, short stretch compression wraps daily from ankle to tibial tuberosity on one leg at a time using correct gradient techniques to return affected limb/s, as closely as possible, to premorbid size and shape, to limit leg pain and infection risk, and to improve safe functional mobility and ADLs performance. 01/03/21: Manufacturer's rep coming next week to assist with fitting adjustable, Velcro style , knee length compression leg wraps. Hopefiullly Pt will be able to manage these more independently, and may be more do-able for caregivers. Drawback is they do not manage foot swelling well.    Baseline dependent    Time 4    Period Days    Status Not Met   CG trained to wrap correctly and to don afdjustable daytime garments, , but Pt remains noncompliant with daily compression.     OT LONG TERM GOAL #2   Title With Maximum caregiver assistance Pt will be able to verbalize signs and symptoms of cellulitis infection and identify lymphedema  precautions using printed resource (modified independence) for reference to decrease infection risk and limit LE progression over time.    Baseline dependent    Time 6    Period Days    Status Achieved      OT LONG TERM GOAL #3   Title With Maximum caregiver assistance Pt >85 % compliant with all daily, LE self-care protocols for home program, including simple self-manual lymphatic drainage (MLD), skin care, lymphatic pumping ther ex, skin care, and compression wraps and garments  to limit LE progression and further functional decline.    Baseline dependent    Time 12    Period Weeks    Status Not Met   Pt lives alone and has no  consistent CG assistance with skin care, compression wraps and / or daytime garments, ther ex and MLD     OT LONG TERM GOAL #4   Baseline dependent    Time 12    Period Weeks    Status Deferred      OT LONG TERM GOAL #5   Title Pt to tolerate compression wraps/ garments in keeping w/ prescribed daily wear regime within 1 week of issue date to progress and retain clinical and functional gains and to limit LE progression.    Baseline dependent    Time 12    Period Weeks    Status Achieved   Met, but non-compliant with daytime compression and wraps     OT LONG TERM GOAL #6   Title Pt to achieve at least 10% LLE limb volume reduction bilaterally during modified Intensive Phase CDT to reduce infection risk, risk of recurrent leg wounds, to improve functional performance of basic and instrumental ADLs, and to limit LE progression.    Baseline dependent    Time 12    Period Weeks    Status Partially Met   04/25/21 BLE clearly more swollen today; by visual assessment I estimate 15-20% increase on L and 10% increase on R from distal legs to toes.Volumetrics not taken today due to time constraints.                  Plan - 09/26/21 1549     Clinical Impression Statement It's obvious that skin care and compression are used  more frequently between less  frequent visits as skin co9ndition is improved and swelling is fluctuating and better managed. Nephew reports he knows they will need more professional assistance in the coming year to keep the daily home lymphedema care at current level. Provided MLD, skin care and applied Velcro style  compression knee highs as established. Cont  every other week ongoing to assist with LE management.    OT Occupational Profile and History Comprehensive Assessment- Review of records and extensive additional review of physical, cognitive, psychosocial history related to current functional performance    Occupational Profile and client history currently impacting functional performance dimensia, inconsistent CG assistance with removing wraps, providing skin care and reapplying compression between clinical visits. S    Occupational performance deficits (Please refer to evaluation for details): ADL's;IADL's;Rest and Sleep;Play;Leisure;Social Participation;Other   body image   Body Structure / Function / Physical Skills ADL;Decreased knowledge of use of DME;Pain;Edema;Scar mobility;Decreased knowledge of precautions;Skin integrity    Rehab Potential Fair    Clinical Decision Making Multiple treatment options, significant modification of task necessary    Comorbidities Affecting Occupational Performance: Presence of comorbidities impacting occupational performance    Modification or Assistance to Complete Evaluation  Max significant modification of tasks or assist is necessary to complete    OT Frequency Other (comment)   every other week   OT Duration 12 weeks   and PRN ongoing to ensure optimal LE management and to support caregiver. Without ongoing skilled OT condition will worsen and further functional decline is expected   OT Treatment/Interventions Self-care/ADL training;DME and/or AE instruction;Manual lymph drainage;Patient/family education;Compression bandaging;Therapeutic exercise;Manual Therapy;Other (comment)     Plan skin care to decrease infection risk and improve hydration    Recommended Other Services fit with adjustable , alternative compression wraps for optimal independence w/ donning and doffing. Cnsider CircAid with footpieces.    Consulted and Agree with Plan of Care Patient;Family member/caregiver  Patient will benefit from skilled therapeutic intervention in order to improve the following deficits and impairments:   Body Structure / Function / Physical Skills: ADL, Decreased knowledge of use of DME, Pain, Edema, Scar mobility, Decreased knowledge of precautions, Skin integrity       Visit Diagnosis: Lymphedema, not elsewhere classified - Plan: Ot plan of care cert/re-cert    Problem List Patient Active Problem List   Diagnosis Date Noted   PVD (peripheral vascular disease) ([place]) 04/27/2021   Medication noncompliance due to cognitive impairment 10/17/2019   Chronic pain of multiple joints 08/01/2017   Age-related osteoporosis without current pathological fracture 08/01/2017   Left knee pain 09/19/2016   Rectal benign neoplasm    Asthma, chronic 04/04/2015   history of Tubular adenoma of colon 04/04/2015   CKD (chronic kidney disease) stage 3, GFR 30-59 ml/min (HCC) 04/03/2015   Edema 96/92/4932   Chronic systolic heart failure ([place]) 11/20/2013   Lymphedema of lower extremity 10/17/2013   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[place]) 04/04/2013   MGUS (monoclonal gammopathy of unknown significance) 05/15/2012   Vitamin D deficiency 01/29/2010   DERMATITIS 09/30/2009   Paranoid delusion ([place]) 08/02/2008   Obstructive chronic bronchitis ([place]) 08/02/2008   Essential hypertension 11/11/2007   Venous (peripheral) insufficiency 11/11/2007   RENAL CALCULUS 11/11/2007   LOW BACK PAIN, CHRONIC 11/11/2007    Andrey Spearman, MS, OTR/L, CLT-LANA 09/26/21 4:19 PM   [place] MAIN [facility] SERVICES [place], [place], [address] Phone:  [phone]   Fax:  [phone]  Name: [name] MRN: [id number] Date of Birth: 21-Sep-1932

## 2021-10-03 ENCOUNTER — Encounter: Payer: Medicare Other | Admitting: Occupational Therapy

## 2021-10-10 ENCOUNTER — Ambulatory Visit: Payer: Medicare Other | Attending: Family | Admitting: Occupational Therapy

## 2021-10-10 ENCOUNTER — Other Ambulatory Visit: Payer: Self-pay

## 2021-10-10 DIAGNOSIS — I890 Lymphedema, not elsewhere classified: Secondary | ICD-10-CM | POA: Insufficient documentation

## 2021-10-10 NOTE — Therapy (Signed)
[place] MAIN [facility] SERVICES [address] [place], [place], [address] Phone: [phone]   Fax:  [phone]  Occupational Therapy Treatment  Patient Details  Name: [name] MRN: [id number] Date of Birth: Jul 16, 1932 No data recorded  Encounter Date: 10/10/2021   OT End of Session - 10/10/21 1510     Visit Number 45    Number of Visits 72    Date for OT Re-Evaluation 12/25/21    Authorization Time Period 09/28/20 - 01/03/21    OT Start Time 0310    Activity Tolerance Patient tolerated treatment well;Patient limited by pain;No increased pain    Behavior During Therapy WFL for tasks assessed/performed             Past Medical History:  Diagnosis Date   Anemia, unspecified    Anxiety    Arthritis    Asthma    Delusions ([place])    Depression    Edema 2005   "since put the glass in them"    GERD (gastroesophageal reflux disease)    Lumbago    Lymphedema    Obstructive chronic bronchitis without exacerbation ([place])    Other abnormal glucose    Psychotic paranoia ([place]) 12/31/3543   Systolic heart failure ([place])    EF 35 to 40% per echo January 2015   Unspecified essential hypertension    Unspecified nonpsychotic mental disorder    Unspecified venous (peripheral) insufficiency    Unspecified venous (peripheral) insufficiency    Unspecified vitamin D deficiency     Past Surgical History:  Procedure Laterality Date   APPENDECTOMY     CERVICAL SPINE SURGERY     x 2   CHOLECYSTECTOMY     ESOPHAGOGASTRODUODENOSCOPY N/A 04/05/2015   Procedure: ESOPHAGOGASTRODUODENOSCOPY (EGD);  Surgeon: [name], MD;  Location: [facility];  Service: Endoscopy;  Laterality: N/A;   FLEXIBLE SIGMOIDOSCOPY N/A 04/05/2015   Procedure: FLEXIBLE SIGMOIDOSCOPY;  Surgeon: [name], MD;  Location: [facility];  Service: Endoscopy;  Laterality: N/A;   HAND SURGERY     LUMBAR DISC SURGERY     PARTIAL HYSTERECTOMY     TONSILLECTOMY       There were no vitals filed for this visit.   Subjective Assessment - 10/10/21 1511     Subjective  Pt Ms. Daley presents for OT vist 44/100 to address BLE lymphedema. Pt was last seen on 11/14//22 when we reduced treatment frequency to q 2 qweeks as she transitioned to self-management phase of CDT. Ms. Slingerland is accompanied by her nephew, [name]. She presents without compression in place. Pt reports back and leg pain, but does not rate pain numerically. Pt has no new conmcerns. Desma Paganini, reports Pt has some areas with mild weeping an anterior distal leg where skin came off with bathing.    Patient is accompanied by: Family member    Pertinent History Chronic obstructive bronchitis, CVI, anxiety , systoloc heart failure, asthma, arthritis, multiple spinal surgeries w/ chronic back pain, DM,    Limitations Difficulty walking, impaired standing tolerance, impaired transfers, decreased joint AROM at toes, ankles and knees, chronic leg swelling , chronic pain. increased infection and falls risks, cognitive impairment, generaized weakness    Repetition Increases Symptoms    Special Tests + Stemmer bilaterally    Patient Stated Goals reduce swelling and legs feel better    Pain Onset Other (comment)  OT Treatments/Exercises (OP) - 10/10/21 1614       ADLs   ADL Education Given Yes      Manual Therapy   Manual Therapy Edema management;Compression Bandaging    Manual Lymphatic Drainage (MLD) MLD to LLE as established. Pants worn to clinic severly limit access to inguinal LN      for proximal MLD.    Compression Bandaging Multilayer, gradient compression wraps applied to LLE using 1 each of short stretch bandage 8, 10 and 12 cm wide over single layer of fRosidal oam and soft cotton stockinett. from base of toes to tibial tuberosity. CircAid adjustable knee length garment alternative applied to RLE using  ~ 30-40 mmHg.    Other Manual Therapy skin care  to BLE with low ph castor oil concurrent w MLD                    OT Education - 10/10/21 1615     Education provided Yes    Education Details Continued Pt/ CG edu for lymphedema self care  and home program throughout session. Topics include multilayer, gradient compression wrapping, simple self-MLD, therapeutic lymphatic pumping exercises, skin/nail care, risk reduction factors and LE precautions, compression garments/recommendations and wear and care schedule and compression garment donning / doffing using assistive devices. All questions answered to the Pt's satisfaction, and Pt demonstrates understanding by report.    Person(s) Educated Patient;Caregiver(s)    Methods Explanation;Demonstration    Comprehension Verbalized understanding;Returned demonstration;Need further instruction                 OT Long Term Goals - 07/19/21 7342       OT LONG TERM GOAL #1   Title With maximum caregiver assistance Pt will be able to apply knee length, multi-layer, short stretch compression wraps daily from ankle to tibial tuberosity on one leg at a time using correct gradient techniques to return affected limb/s, as closely as possible, to premorbid size and shape, to limit leg pain and infection risk, and to improve safe functional mobility and ADLs performance. 01/03/21: Manufacturer's rep coming next week to assist with fitting adjustable, Velcro style , knee length compression leg wraps. Hopefiullly Pt will be able to manage these more independently, and may be more do-able for caregivers. Drawback is they do not manage foot swelling well.    Baseline dependent    Time 4    Period Days    Status Not Met   CG trained to wrap correctly and to don afdjustable daytime garments, , but Pt remains noncompliant with daily compression.     OT LONG TERM GOAL #2   Title With Maximum caregiver assistance Pt will be able to verbalize signs and symptoms of cellulitis infection and identify  lymphedema precautions using printed resource (modified independence) for reference to decrease infection risk and limit LE progression over time.    Baseline dependent    Time 6    Period Days    Status Achieved      OT LONG TERM GOAL #3   Title With Maximum caregiver assistance Pt >85 % compliant with all daily, LE self-care protocols for home program, including simple self-manual lymphatic drainage (MLD), skin care, lymphatic pumping ther ex, skin care, and compression wraps and garments  to limit LE progression and further functional decline.    Baseline dependent    Time 12    Period Weeks    Status Not Met   Pt lives alone and has no  consistent CG assistance with skin care, compression wraps and / or daytime garments, ther ex and MLD     OT LONG TERM GOAL #4   Baseline dependent    Time 12    Period Weeks    Status Deferred      OT LONG TERM GOAL #5   Title Pt to tolerate compression wraps/ garments in keeping w/ prescribed daily wear regime within 1 week of issue date to progress and retain clinical and functional gains and to limit LE progression.    Baseline dependent    Time 12    Period Weeks    Status Achieved   Met, but non-compliant with daytime compression and wraps     OT LONG TERM GOAL #6   Title Pt to achieve at least 10% LLE limb volume reduction bilaterally during modified Intensive Phase CDT to reduce infection risk, risk of recurrent leg wounds, to improve functional performance of basic and instrumental ADLs, and to limit LE progression.    Baseline dependent    Time 12    Period Weeks    Status Partially Met   04/25/21 BLE clearly more swollen today; by visual assessment I estimate 15-20% increase on L and 10% increase on R from distal legs to toes.Volumetrics not taken today due to time constraints.                  Plan - 10/10/21 1616     Clinical Impression Statement Pt's skin is more cracked and dry th8is visit than it has been in the past few  sessions. Limb volume conmtinues to fluctuate. Overall family is assisting with skin care and compression much more frequently since we reduced OT frequency to q 2 weeks. We discussed trial with reducing frequency to q 3 weeks during remaining cold weather mo9nths, which we'll plan as a trial. Pt tolerated MLD, skin care and compression garments to LLE without increased pain today. Cont as per POC. Reduce frequency after next visit.    OT Occupational Profile and History Comprehensive Assessment- Review of records and extensive additional review of physical, cognitive, psychosocial history related to current functional performance    Occupational Profile and client history currently impacting functional performance dimensia, inconsistent CG assistance with removing wraps, providing skin care and reapplying compression between clinical visits. S    Occupational performance deficits (Please refer to evaluation for details): ADL's;IADL's;Rest and Sleep;Play;Leisure;Social Participation;Other   body image   Body Structure / Function / Physical Skills ADL;Decreased knowledge of use of DME;Pain;Edema;Scar mobility;Decreased knowledge of precautions;Skin integrity    Rehab Potential Fair    Clinical Decision Making Multiple treatment options, significant modification of task necessary    Comorbidities Affecting Occupational Performance: Presence of comorbidities impacting occupational performance    Modification or Assistance to Complete Evaluation  Max significant modification of tasks or assist is necessary to complete    OT Frequency Other (comment)   every other week   OT Duration 12 weeks   and PRN ongoing to ensure optimal LE management and to support caregiver. Without ongoing skilled OT condition will worsen and further functional decline is expected   OT Treatment/Interventions Self-care/ADL training;DME and/or AE instruction;Manual lymph drainage;Patient/family education;Compression  bandaging;Therapeutic exercise;Manual Therapy;Other (comment)    Plan skin care to decrease infection risk and improve hydration    Recommended Other Services fit with adjustable , alternative compression wraps for optimal independence w/ donning and doffing. Cnsider CircAid with footpieces.    Consulted and Agree with Plan  of Care Patient;Family member/caregiver             Patient will benefit from skilled therapeutic intervention in order to improve the following deficits and impairments:   Body Structure / Function / Physical Skills: ADL, Decreased knowledge of use of DME, Pain, Edema, Scar mobility, Decreased knowledge of precautions, Skin integrity       Visit Diagnosis: Lymphedema, not elsewhere classified    Problem List Patient Active Problem List   Diagnosis Date Noted   PVD (peripheral vascular disease) ([place]) 04/27/2021   Medication noncompliance due to cognitive impairment 10/17/2019   Chronic pain of multiple joints 08/01/2017   Age-related osteoporosis without current pathological fracture 08/01/2017   Left knee pain 09/19/2016   Rectal benign neoplasm    Asthma, chronic 04/04/2015   history of Tubular adenoma of colon 04/04/2015   CKD (chronic kidney disease) stage 3, GFR 30-59 ml/min (HCC) 04/03/2015   Edema 22/29/7989   Chronic systolic heart failure ([place]) 11/20/2013   Lymphedema of lower extremity 10/17/2013   Psychotic paranoia ([place]) 04/04/2013   MGUS (monoclonal gammopathy of unknown significance) 05/15/2012   Vitamin D deficiency 01/29/2010   DERMATITIS 09/30/2009   Paranoid delusion ([place]) 08/02/2008   Obstructive chronic bronchitis ([place]) 08/02/2008   Essential hypertension 11/11/2007   Venous (peripheral) insufficiency 11/11/2007   RENAL CALCULUS 11/11/2007   LOW BACK PAIN, CHRONIC 11/11/2007    Andrey Spearman, MS, OTR/L, CLT-LANA 10/10/21 4:19 PM  [place] MAIN [facility] SERVICES [place], [place], [address] Phone: [phone]   Fax:  [phone]  Name: [name] MRN: [id number] Date of Birth: 1931-12-26

## 2021-10-10 NOTE — Patient Instructions (Signed)

## 2021-10-17 ENCOUNTER — Other Ambulatory Visit: Payer: Self-pay

## 2021-10-17 ENCOUNTER — Ambulatory Visit: Payer: Medicare Other | Admitting: Occupational Therapy

## 2021-10-17 ENCOUNTER — Encounter: Payer: Medicare Other | Admitting: Occupational Therapy

## 2021-10-17 DIAGNOSIS — I890 Lymphedema, not elsewhere classified: Secondary | ICD-10-CM | POA: Diagnosis not present

## 2021-10-17 NOTE — Therapy (Signed)
[place] MAIN [facility] SERVICES [address] [place], [place], [address] Phone: [phone]   Fax:  [phone]  Occupational Therapy Treatment  Patient Details  Name: [name] MRN: [id number] Date of Birth: October 09, 1932 No data recorded  Encounter Date: 10/17/2021   OT End of Session - 10/17/21 1509     Visit Number 46    Number of Visits 72    Date for OT Re-Evaluation 12/25/21    Activity Tolerance Patient tolerated treatment well;Patient limited by pain;No increased pain    Behavior During Therapy WFL for tasks assessed/performed             Past Medical History:  Diagnosis Date   Anemia, unspecified    Anxiety    Arthritis    Asthma    Delusions ([name])    Depression    Edema 2005   "since put the glass in them"    GERD (gastroesophageal reflux disease)    Lumbago    Lymphedema    Obstructive chronic bronchitis without exacerbation ([place])    Other abnormal glucose    Psychotic paranoia ([place]) 11/05/107   Systolic heart failure ([place])    EF 35 to 40% per echo January 2015   Unspecified essential hypertension    Unspecified nonpsychotic mental disorder    Unspecified venous (peripheral) insufficiency    Unspecified venous (peripheral) insufficiency    Unspecified vitamin D deficiency     Past Surgical History:  Procedure Laterality Date   APPENDECTOMY     CERVICAL SPINE SURGERY     x 2   CHOLECYSTECTOMY     ESOPHAGOGASTRODUODENOSCOPY N/A 04/05/2015   Procedure: ESOPHAGOGASTRODUODENOSCOPY (EGD);  Surgeon: [name], MD;  Location: [facility];  Service: Endoscopy;  Laterality: N/A;   FLEXIBLE SIGMOIDOSCOPY N/A 04/05/2015   Procedure: FLEXIBLE SIGMOIDOSCOPY;  Surgeon: [name], MD;  Location: [facility];  Service: Endoscopy;  Laterality: N/A;   HAND SURGERY     LUMBAR DISC SURGERY     PARTIAL HYSTERECTOMY     TONSILLECTOMY      There were no vitals filed for this visit.   Subjective Assessment -  10/17/21 1511     Subjective  [name] presents for OT vist 45/100 to address BLE lymphedema. . [name] is accompanied by her nephew, [name]. She presents without compression in place. Pt reports leg pain and swelling is an 8/10. [name] reports he ordered Pandere shoes for [name] during visit interval.    Patient is accompanied by: Family member    Pertinent History Chronic obstructive bronchitis, CVI, anxiety , systoloc heart failure, asthma, arthritis, multiple spinal surgeries w/ chronic back pain, DM,    Limitations Difficulty walking, impaired standing tolerance, impaired transfers, decreased joint AROM at toes, ankles and knees, chronic leg swelling , chronic pain. increased infection and falls risks, cognitive impairment, generaized weakness    Repetition Increases Symptoms    Special Tests + Stemmer bilaterally    Patient Stated Goals reduce swelling and legs feel better    Pain Onset Other (comment)                          OT Treatments/Exercises (OP) - 10/17/21 1517       ADLs   ADL Education Given Yes      Manual Therapy   Manual Therapy Edema management;Compression Bandaging    Manual Lymphatic Drainage (MLD) MLD to LLE as established. Pants worn  to clinic severly limit access to inguinal LN      for proximal MLD.    Compression Bandaging Rosidal foal         applied in single layer circumferentially over cotton stockinet from base of toes to below knee. These positioned under CircAid reductions kits on both L and R legs.    Other Manual Therapy skin care to LLE with low ph castor oil concurrent w MLD                    OT Education - 10/17/21 1610     Education provided Yes    Education Details Provided specificatons for replacements for CircAid reduction kit garments- standard length. Continued Pt/ CG edu for lymphedema self care  and home program throughout session. Topics include multilayer, gradient compression wrapping, simple  self-MLD, therapeutic lymphatic pumping exercises, skin/nail care, risk reduction factors and LE precautions, compression garments/recommendations and wear and care schedule and compression garment donning / doffing using assistive devices. All questions answered to the Pt's satisfaction, and Pt demonstrates understanding by report.    Person(s) Educated Patient;Caregiver(s)    Methods Explanation;Demonstration    Comprehension Verbalized understanding;Returned demonstration;Need further instruction                 OT Long Term Goals - 07/19/21 6767       OT LONG TERM GOAL #1   Title With maximum caregiver assistance Pt will be able to apply knee length, multi-layer, short stretch compression wraps daily from ankle to tibial tuberosity on one leg at a time using correct gradient techniques to return affected limb/s, as closely as possible, to premorbid size and shape, to limit leg pain and infection risk, and to improve safe functional mobility and ADLs performance. 01/03/21: Manufacturer's rep coming next week to assist with fitting adjustable, Velcro style , knee length compression leg wraps. Hopefiullly Pt will be able to manage these more independently, and may be more do-able for caregivers. Drawback is they do not manage foot swelling well.    Baseline dependent    Time 4    Period Days    Status Not Met   CG trained to wrap correctly and to don afdjustable daytime garments, , but Pt remains noncompliant with daily compression.     OT LONG TERM GOAL #2   Title With Maximum caregiver assistance Pt will be able to verbalize signs and symptoms of cellulitis infection and identify lymphedema precautions using printed resource (modified independence) for reference to decrease infection risk and limit LE progression over time.    Baseline dependent    Time 6    Period Days    Status Achieved      OT LONG TERM GOAL #3   Title With Maximum caregiver assistance Pt >85 % compliant with all  daily, LE self-care protocols for home program, including simple self-manual lymphatic drainage (MLD), skin care, lymphatic pumping ther ex, skin care, and compression wraps and garments  to limit LE progression and further functional decline.    Baseline dependent    Time 12    Period Weeks    Status Not Met   Pt lives alone and has no consistent CG assistance with skin care, compression wraps and / or daytime garments, ther ex and MLD     OT LONG TERM GOAL #4   Baseline dependent    Time 12    Period Weeks    Status Deferred  OT LONG TERM GOAL #5   Title Pt to tolerate compression wraps/ garments in keeping w/ prescribed daily wear regime within 1 week of issue date to progress and retain clinical and functional gains and to limit LE progression.    Baseline dependent    Time 12    Period Weeks    Status Achieved   Met, but non-compliant with daytime compression and wraps     OT LONG TERM GOAL #6   Title Pt to achieve at least 10% LLE limb volume reduction bilaterally during modified Intensive Phase CDT to reduce infection risk, risk of recurrent leg wounds, to improve functional performance of basic and instrumental ADLs, and to limit LE progression.    Baseline dependent    Time 12    Period Weeks    Status Partially Met   04/25/21 BLE clearly more swollen today; by visual assessment I estimate 15-20% increase on L and 10% increase on R from distal legs to toes.Volumetrics not taken today due to time constraints.                  Plan - 10/17/21 1611     Clinical Impression Statement Provided specificatons for replacements for CircAid reduction kit garments- standard length. Provided LLE MLD and skin care without increased pain. Applied BLE CircAids as established. Pt's nephew with purchase replacements during visit interval to make laundering and alternating garment alternatives aeasier. Cont q2 weeks, reducing to monthly in Jan    through remaining winter months, and  PRN as family members continue   to take on more lymphedema home care support between visits.    OT Occupational Profile and History Comprehensive Assessment- Review of records and extensive additional review of physical, cognitive, psychosocial history related to current functional performance    Occupational Profile and client history currently impacting functional performance dimensia, inconsistent CG assistance with removing wraps, providing skin care and reapplying compression between clinical visits. S    Occupational performance deficits (Please refer to evaluation for details): ADL's;IADL's;Rest and Sleep;Play;Leisure;Social Participation;Other   body image   Body Structure / Function / Physical Skills ADL;Decreased knowledge of use of DME;Pain;Edema;Scar mobility;Decreased knowledge of precautions;Skin integrity    Rehab Potential Fair    Clinical Decision Making Multiple treatment options, significant modification of task necessary    Comorbidities Affecting Occupational Performance: Presence of comorbidities impacting occupational performance    Modification or Assistance to Complete Evaluation  Max significant modification of tasks or assist is necessary to complete    OT Frequency Other (comment)   every other week   OT Duration 12 weeks   and PRN ongoing to ensure optimal LE management and to support caregiver. Without ongoing skilled OT condition will worsen and further functional decline is expected   OT Treatment/Interventions Self-care/ADL training;DME and/or AE instruction;Manual lymph drainage;Patient/family education;Compression bandaging;Therapeutic exercise;Manual Therapy;Other (comment)    Plan skin care to decrease infection risk and improve hydration    Recommended Other Services fit with adjustable , alternative compression wraps for optimal independence w/ donning and doffing. Cnsider CircAid with footpieces.    Consulted and Agree with Plan of Care Patient;Family  member/caregiver             Patient will benefit from skilled therapeutic intervention in order to improve the following deficits and impairments:   Body Structure / Function / Physical Skills: ADL, Decreased knowledge of use of DME, Pain, Edema, Scar mobility, Decreased knowledge of precautions, Skin integrity  Visit Diagnosis: Lymphedema, not elsewhere classified    Problem List Patient Active Problem List   Diagnosis Date Noted   PVD (peripheral vascular disease) ([place]) 04/27/2021   Medication noncompliance due to cognitive impairment 10/17/2019   Chronic pain of multiple joints 08/01/2017   Age-related osteoporosis without current pathological fracture 08/01/2017   Left knee pain 09/19/2016   Rectal benign neoplasm    Asthma, chronic 04/04/2015   history of Tubular adenoma of colon 04/04/2015   CKD (chronic kidney disease) stage 3, GFR 30-59 ml/min (HCC) 04/03/2015   Edema 61/22/4497   Chronic systolic heart failure ([place]) 11/20/2013   Lymphedema of lower extremity 10/17/2013   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[place]) 04/04/2013   MGUS (monoclonal gammopathy of unknown significance) 05/15/2012   Vitamin D deficiency 01/29/2010   DERMATITIS 09/30/2009   Paranoid delusion ([place]) 08/02/2008   Obstructive chronic bronchitis ([place]) 08/02/2008   Essential hypertension 11/11/2007   Venous (peripheral) insufficiency 11/11/2007   RENAL CALCULUS 11/11/2007   LOW BACK PAIN, CHRONIC 11/11/2007   Andrey Spearman, Sophia, OTR/L, CLT-LANA 10/17/21 4:17 PM    [place] MAIN [facility] SERVICES [place], [place], [address] Phone: [phone]   Fax:  [phone]  Name: [name] MRN: [id number] Date of Birth: Jun 02, 1932

## 2021-10-17 NOTE — Patient Instructions (Signed)

## 2021-10-19 ENCOUNTER — Other Ambulatory Visit: Payer: Self-pay | Admitting: Nurse Practitioner

## 2021-10-19 DIAGNOSIS — I890 Lymphedema, not elsewhere classified: Secondary | ICD-10-CM

## 2021-10-19 DIAGNOSIS — I5022 Chronic systolic (congestive) heart failure: Secondary | ICD-10-CM

## 2021-10-28 ENCOUNTER — Other Ambulatory Visit: Payer: Self-pay

## 2021-10-28 ENCOUNTER — Ambulatory Visit (INDEPENDENT_AMBULATORY_CARE_PROVIDER_SITE_OTHER): Payer: Medicare Other

## 2021-10-28 DIAGNOSIS — M810 Age-related osteoporosis without current pathological fracture: Secondary | ICD-10-CM | POA: Diagnosis not present

## 2021-10-28 MED ORDER — DENOSUMAB 60 MG/ML ~~LOC~~ SOSY
60.0000 mg | PREFILLED_SYRINGE | Freq: Once | SUBCUTANEOUS | Status: AC
Start: 2021-10-28 — End: 2021-10-28
  Administered 2021-10-28: 60 mg via SUBCUTANEOUS

## 2021-10-31 ENCOUNTER — Other Ambulatory Visit: Payer: Self-pay | Admitting: Nurse Practitioner

## 2021-10-31 DIAGNOSIS — R1013 Epigastric pain: Secondary | ICD-10-CM

## 2021-11-07 ENCOUNTER — Other Ambulatory Visit: Payer: Self-pay

## 2021-11-07 ENCOUNTER — Ambulatory Visit: Payer: Medicare Other | Attending: Family | Admitting: Occupational Therapy

## 2021-11-07 DIAGNOSIS — I890 Lymphedema, not elsewhere classified: Secondary | ICD-10-CM | POA: Diagnosis not present

## 2021-11-07 NOTE — Therapy (Signed)
[place] MAIN [facility] SERVICES [address] [place], [place], [address] Phone: [phone]   Fax:  [phone]  Occupational Therapy Treatment  Patient Details  Name: [name] MRN: [id number] Date of Birth: December 05, 1931 No data recorded  Encounter Date: 11/07/2021   OT End of Session - 11/07/21 1608     Visit Number 28    Number of Visits 72    Date for OT Re-Evaluation 12/25/21    OT Start Time 0305    OT Stop Time 0408    OT Time Calculation (min) 63 min    Activity Tolerance Patient tolerated treatment well;Patient limited by pain;No increased pain    Behavior During Therapy WFL for tasks assessed/performed             Past Medical History:  Diagnosis Date   Anemia, unspecified    Anxiety    Arthritis    Asthma    Delusions ([place])    Depression    Edema 2005   "since put the glass in them"    GERD (gastroesophageal reflux disease)    Lumbago    Lymphedema    Obstructive chronic bronchitis without exacerbation ([place])    Other abnormal glucose    Psychotic paranoia ([place]) 06/01/6628   Systolic heart failure ([place])    EF 35 to 40% per echo January 2015   Unspecified essential hypertension    Unspecified nonpsychotic mental disorder    Unspecified venous (peripheral) insufficiency    Unspecified venous (peripheral) insufficiency    Unspecified vitamin D deficiency     Past Surgical History:  Procedure Laterality Date   APPENDECTOMY     CERVICAL SPINE SURGERY     x 2   CHOLECYSTECTOMY     ESOPHAGOGASTRODUODENOSCOPY N/A 04/05/2015   Procedure: ESOPHAGOGASTRODUODENOSCOPY (EGD);  Surgeon: [name], MD;  Location: [facility];  Service: Endoscopy;  Laterality: N/A;   FLEXIBLE SIGMOIDOSCOPY N/A 04/05/2015   Procedure: FLEXIBLE SIGMOIDOSCOPY;  Surgeon: [name], MD;  Location: [facility];  Service: Endoscopy;  Laterality: N/A;   HAND SURGERY     LUMBAR DISC SURGERY     PARTIAL HYSTERECTOMY     TONSILLECTOMY       There were no vitals filed for this visit.   Subjective Assessment - 11/07/21 1615     Subjective  Pt Ms. Coover presents for OT vist 46/100 to address BLE lymphedema. . Ms. Urey is accompanied by her nephew, [name]. She presents without compression wraps in place, but is wearing soft, cotton, diabetic socks and new [place] , expandable shoes made   to assist people with foot swelling specifically.    Patient is accompanied by: Family member    Pertinent History Chronic obstructive bronchitis, CVI, anxiety , systoloc heart failure, asthma, arthritis, multiple spinal surgeries w/ chronic back pain, DM,    Limitations Difficulty walking, impaired standing tolerance, impaired transfers, decreased joint AROM at toes, ankles and knees, chronic leg swelling , chronic pain. increased infection and falls risks, cognitive impairment, generaized weakness    Repetition Increases Symptoms    Special Tests + Stemmer bilaterally    Patient Stated Goals reduce swelling and legs feel better    Pain Onset Other (comment)                          OT Treatments/Exercises (OP) - 11/07/21 1616       ADLs   ADL Education Given Yes  Manual Therapy   Manual Therapy Edema management;Manual Lymphatic Drainage (MLD);Compression Bandaging    Manual Lymphatic Drainage (MLD) MLD to LLE as established. Pants worn to clinic severly limit access to inguinal LN      for proximal MLD.    Compression Bandaging Rosidal foal         applied in single layer circumferentially over cotton stockinet from base of toes to below knee. These positioned under CircAid reductions kits on both L and R legs.    Other Manual Therapy skin care to LLE with low ph castor oil concurrent w MLD                    OT Education - 11/07/21 1617     Education provided Yes    Education Details Continued Pt/ CG edu for lymphedema self care  and home program throughout session. Topics include multilayer, gradient  compression wrapping, simple self-MLD, therapeutic lymphatic pumping exercises, skin/nail care, risk reduction factors and LE precautions, compression garments/recommendations and wear and care schedule and compression garment donning / doffing using assistive devices. All questions answered to the Pt's satisfaction, and Pt demonstrates understanding by report.    Person(s) Educated Patient;Caregiver(s)    Methods Explanation;Demonstration    Comprehension Verbalized understanding;Returned demonstration;Need further instruction    Person(s) Educated Patient;Child(ren)    Methods Explanation;Demonstration;Tactile cues;Verbal cues;Handout    Comprehension Verbalized understanding;Need further instruction;Returned demonstration;Verbal cues required                 OT Long Term Goals - 07/19/21 0928       OT LONG TERM GOAL #1   Title With maximum caregiver assistance Pt will be able to apply knee length, multi-layer, short stretch compression wraps daily from ankle to tibial tuberosity on one leg at a time using correct gradient techniques to return affected limb/s, as closely as possible, to premorbid size and shape, to limit leg pain and infection risk, and to improve safe functional mobility and ADLs performance. 01/03/21: Manufacturer's rep coming next week to assist with fitting adjustable, Velcro style , knee length compression leg wraps. Hopefi[place]ly Pt will be able to manage these more independently, and may be more do-able for caregivers. Drawback is they do not manage foot swelling well.    Baseline dependent    Time 4    Period Days    Status Not Met   CG trained to wrap correctly and to don afdjustable daytime garments, , but Pt remains noncompliant with daily compression.     OT LONG TERM GOAL #2   Title With Maximum caregiver assistance Pt will be able to verbalize signs and symptoms of cellulitis infection and identify lymphedema precautions using printed resource (modified  independence) for reference to decrease infection risk and limit LE progression over time.    Baseline dependent    Time 6    Period Days    Status Achieved      OT LONG TERM GOAL #3   Title With Maximum caregiver assistance Pt >85 % compliant with all daily, LE self-care protocols for home program, including simple self-manual lymphatic drainage (MLD), skin care, lymphatic pumping ther ex, skin care, and compression wraps and garments  to limit LE progression and further functional decline.    Baseline dependent    Time 12    Period Weeks    Status Not Met   Pt lives alone and has no consistent CG assistance with skin care, compression wraps and / or  daytime garments, ther ex and MLD     OT LONG TERM GOAL #4   Baseline dependent    Time 12    Period Weeks    Status Deferred      OT LONG TERM GOAL #5   Title Pt to tolerate compression wraps/ garments in keeping w/ prescribed daily wear regime within 1 week of issue date to progress and retain clinical and functional gains and to limit LE progression.    Baseline dependent    Time 12    Period Weeks    Status Achieved   Met, but non-compliant with daytime compression and wraps     OT LONG TERM GOAL #6   Title Pt to achieve at least 10% LLE limb volume reduction bilaterally during modified Intensive Phase CDT to reduce infection risk, risk of recurrent leg wounds, to improve functional performance of basic and instrumental ADLs, and to limit LE progression.    Baseline dependent    Time 12    Period Weeks    Status Partially Met   04/25/21 BLE clearly more swollen today; by visual assessment I estimate 15-20% increase on L and 10% increase on R from distal legs to toes.Volumetrics not taken today due to time constraints.                  Plan - 11/07/21 1608     Clinical Impression Statement Pt wearing new shoes today that nephew purchased for her from [place] that are made to acommodate foot swelling. Bt end of  session Pt was able to get L shoe on over compression wraps as foot swelling r4educed during MLD with elevation. Nephew reports this is the first time ZPt has been able to wear shoes on her feet in over 2 years. Pt has been scratching L leg and ankle ,  as evidenced by scabs, dried blood, and abraided skin, etc. No signs/ synptoms of infection observed and no lymphorrhea. Provided LLE MLD and simultaneous skin care to LLE as established. Applied compression wraps and donned both shoes . Productiove session. Cont q 2 weeks through Jan ten decrease frequency to 1 x monthly as we continue to wean a3way from clinical LE care and family takes more active role in caregiving.    OT Occupational Profile and History Comprehensive Assessment- Review of records and extensive additional review of physical, cognitive, psychosocial history related to current functional performance    Occupational Profile and client history currently impacting functional performance dimensia, inconsistent CG assistance with removing wraps, providing skin care and reapplying compression between clinical visits. S    Occupational performance deficits (Please refer to evaluation for details): ADL's;IADL's;Rest and Sleep;Play;Leisure;Social Participation;Other   body image   Body Structure / Function / Physical Skills ADL;Decreased knowledge of use of DME;Pain;Edema;Scar mobility;Decreased knowledge of precautions;Skin integrity    Rehab Potential Fair    Clinical Decision Making Multiple treatment options, significant modification of task necessary    Comorbidities Affecting Occupational Performance: Presence of comorbidities impacting occupational performance    Modification or Assistance to Complete Evaluation  Max significant modification of tasks or assist is necessary to complete    OT Frequency Other (comment)   every other week   OT Duration 12 weeks   and PRN ongoing to ensure optimal LE management and to support caregiver. Without  ongoing skilled OT condition will worsen and further functional decline is expected   OT Treatment/Interventions Self-care/ADL training;DME and/or AE instruction;Manual lymph drainage;Patient/family education;Compression bandaging;Therapeutic exercise;Manual Therapy;Other (  comment)    Plan skin care to decrease infection risk and improve hydration    Recommended Other Services fit with adjustable , alternative compression wraps for optimal independence w/ donning and doffing. Cnsider CircAid with footpieces.    Consulted and Agree with Plan of Care Patient;Family member/caregiver             Patient will benefit from skilled therapeutic intervention in order to improve the following deficits and impairments:   Body Structure / Function / Physical Skills: ADL, Decreased knowledge of use of DME, Pain, Edema, Scar mobility, Decreased knowledge of precautions, Skin integrity       Visit Diagnosis: Lymphedema, not elsewhere classified    Problem List Patient Active Problem List   Diagnosis Date Noted   PVD (peripheral vascular disease) ([place]) 04/27/2021   Medication noncompliance due to cognitive impairment 10/17/2019   Chronic pain of multiple joints 08/01/2017   Age-related osteoporosis without current pathological fracture 08/01/2017   Left knee pain 09/19/2016   Rectal benign neoplasm    Asthma, chronic 04/04/2015   history of Tubular adenoma of colon 04/04/2015   CKD (chronic kidney disease) stage 3, GFR 30-59 ml/min (HCC) 04/03/2015   Edema 16/07/9603   Chronic systolic heart failure ([place]) 11/20/2013   Lymphedema of lower extremity 10/17/2013   Psychotic paranoia ([place]) 04/04/2013   MGUS (monoclonal gammopathy of unknown significance) 05/15/2012   Vitamin D deficiency 01/29/2010   DERMATITIS 09/30/2009   Paranoid delusion ([place]) 08/02/2008   Obstructive chronic bronchitis ([place]) 08/02/2008   Essential hypertension 11/11/2007   Venous (peripheral) insufficiency 11/11/2007    RENAL CALCULUS 11/11/2007   LOW BACK PAIN, CHRONIC 11/11/2007    Andrey Spearman, MS, OTR/L, CLT-LANA 11/07/21 4:18 PM  [place] MAIN [facility] SERVICES [place], [place], [address] Phone: [phone]   Fax:  [phone]  Name: JAELYN BOURGOIN MRN: [id number] Date of Birth: 26-Jul-1932

## 2021-11-07 NOTE — Patient Instructions (Signed)

## 2021-11-21 ENCOUNTER — Other Ambulatory Visit: Payer: Self-pay

## 2021-11-21 ENCOUNTER — Ambulatory Visit: Payer: Medicare Other | Admitting: Occupational Therapy

## 2021-11-21 DIAGNOSIS — I890 Lymphedema, not elsewhere classified: Secondary | ICD-10-CM

## 2021-11-21 NOTE — Patient Instructions (Signed)

## 2021-11-21 NOTE — Therapy (Signed)
[place] MAIN [facility] SERVICES [address] [place], [place], [address] Phone: [phone]   Fax:  [phone]  Occupational Therapy Treatment  Patient Details  Name: [name] MRN: [id number] Date of Birth: 1932/01/17 No data recorded  Encounter Date: 11/21/2021   OT End of Session - 11/21/21 1510     Visit Number 48    Number of Visits 72    Date for OT Re-Evaluation 12/25/21    OT Start Time 0300    OT Stop Time 0400    OT Time Calculation (min) 60 min    Activity Tolerance Patient tolerated treatment well;Patient limited by pain;No increased pain    Behavior During Therapy WFL for tasks assessed/performed             Past Medical History:  Diagnosis Date   Anemia, unspecified    Anxiety    Arthritis    Asthma    Delusions ([place])    Depression    Edema 2005   "since put the glass in them"    GERD (gastroesophageal reflux disease)    Lumbago    Lymphedema    Obstructive chronic bronchitis without exacerbation ([place])    Other abnormal glucose    Psychotic paranoia ([place]) 01/02/7016   Systolic heart failure ([place])    EF 35 to 40% per echo January 2015   Unspecified essential hypertension    Unspecified nonpsychotic mental disorder    Unspecified venous (peripheral) insufficiency    Unspecified venous (peripheral) insufficiency    Unspecified vitamin D deficiency     Past Surgical History:  Procedure Laterality Date   APPENDECTOMY     CERVICAL SPINE SURGERY     x 2   CHOLECYSTECTOMY     ESOPHAGOGASTRODUODENOSCOPY N/A 04/05/2015   Procedure: ESOPHAGOGASTRODUODENOSCOPY (EGD);  Surgeon: [name], MD;  Location: [facility];  Service: Endoscopy;  Laterality: N/A;   FLEXIBLE SIGMOIDOSCOPY N/A 04/05/2015   Procedure: FLEXIBLE SIGMOIDOSCOPY;  Surgeon: [name], MD;  Location: [facility];  Service: Endoscopy;  Laterality: N/A;   HAND SURGERY     LUMBAR DISC SURGERY     PARTIAL HYSTERECTOMY     TONSILLECTOMY       There were no vitals filed for this visit.   Subjective Assessment - 11/21/21 1515     Subjective  Pt Ms. Borski presents for OT vist 48/100 to address BLE lymphedema. . Ms. Wery is accompanied by her nephew, [name]. She presents without compression wraps in place, but is wearing soft, cotton, diabetic socks . Pt reports both back and leg pain today at 9/10.    Patient is accompanied by: Family member    Pertinent History Chronic obstructive bronchitis, CVI, anxiety , systoloc heart failure, asthma, arthritis, multiple spinal surgeries w/ chronic back pain, DM,    Limitations Difficulty walking, impaired standing tolerance, impaired transfers, decreased joint AROM at toes, ankles and knees, chronic leg swelling , chronic pain. increased infection and falls risks, cognitive impairment, generaized weakness    Repetition Increases Symptoms    Special Tests + Stemmer bilaterally    Patient Stated Goals reduce swelling and legs feel better    Pain Onset Other (comment)                          OT Treatments/Exercises (OP) - 11/21/21 1604       ADLs   ADL Education Given Yes      Manual Therapy  Manual Therapy Edema management;Manual Lymphatic Drainage (MLD);Compression Bandaging    Manual Lymphatic Drainage (MLD) MLD to LLE as established. Pants worn to clinic severly limit access to inguinal LN      for proximal MLD.    Compression Bandaging Rosidal foal         applied in single layer circumferentially over cotton stockinet from base of toes to below knee. These positioned under CircAid reductions kits on both L and R legs.    Other Manual Therapy skin care to LLE with low ph castor oil concurrent w MLD                    OT Education - 11/21/21 1600     Education provided Yes    Education Details Continued Pt/ CG edu for lymphedema self care  and home program throughout session. Topics include multilayer, gradient compression wrapping, simple self-MLD,  therapeutic lymphatic pumping exercises, skin/nail care, risk reduction factors and LE precautions, compression garments/recommendations and wear and care schedule and compression garment donning / doffing using assistive devices. All questions answered to the Pt's satisfaction, and Pt demonstrates understanding by report.    Person(s) Educated Patient;Caregiver(s)    Methods Explanation;Demonstration    Comprehension Verbalized understanding;Returned demonstration;Need further instruction                 OT Long Term Goals - 07/19/21 0102       OT LONG TERM GOAL #1   Title With maximum caregiver assistance Pt will be able to apply knee length, multi-layer, short stretch compression wraps daily from ankle to tibial tuberosity on one leg at a time using correct gradient techniques to return affected limb/s, as closely as possible, to premorbid size and shape, to limit leg pain and infection risk, and to improve safe functional mobility and ADLs performance. 01/03/21: Manufacturer's rep coming next week to assist with fitting adjustable, Velcro style , knee length compression leg wraps. [place] Pt will be able to manage these more independently, and may be more do-able for caregivers. Drawback is they do not manage foot swelling well.    Baseline dependent    Time 4    Period Days    Status Not Met   CG trained to wrap correctly and to don afdjustable daytime garments, , but Pt remains noncompliant with daily compression.     OT LONG TERM GOAL #2   Title With Maximum caregiver assistance Pt will be able to verbalize signs and symptoms of cellulitis infection and identify lymphedema precautions using printed resource (modified independence) for reference to decrease infection risk and limit LE progression over time.    Baseline dependent    Time 6    Period Days    Status Achieved      OT LONG TERM GOAL #3   Title With Maximum caregiver assistance Pt >85 % compliant with all daily, LE  self-care protocols for home program, including simple self-manual lymphatic drainage (MLD), skin care, lymphatic pumping ther ex, skin care, and compression wraps and garments  to limit LE progression and further functional decline.    Baseline dependent    Time 12    Period Weeks    Status Not Met   Pt lives alone and has no consistent CG assistance with skin care, compression wraps and / or daytime garments, ther ex and MLD     OT LONG TERM GOAL #4   Baseline dependent    Time 12    Period  Weeks    Status Deferred      OT LONG TERM GOAL #5   Title Pt to tolerate compression wraps/ garments in keeping w/ prescribed daily wear regime within 1 week of issue date to progress and retain clinical and functional gains and to limit LE progression.    Baseline dependent    Time 12    Period Weeks    Status Achieved   Met, but non-compliant with daytime compression and wraps     OT LONG TERM GOAL #6   Title Pt to achieve at least 10% LLE limb volume reduction bilaterally during modified Intensive Phase CDT to reduce infection risk, risk of recurrent leg wounds, to improve functional performance of basic and instrumental ADLs, and to limit LE progression.    Baseline dependent    Time 12    Period Weeks    Status Partially Met   04/25/21 BLE clearly more swollen today; by visual assessment I estimate 15-20% increase on L and 10% increase on R from distal legs to toes.Volumetrics not taken today due to time constraints.                  Plan - 11/21/21 1600     Clinical Impression Statement BLE swelling well managed today. Skin is very dry with deep flaking, but no signs of recent or  lymphorrhea today. No open areas or areas of dried blood. Pt tolerated LLE MLD, skin care without increased pain. Applied RLE CircAid   over stockinett only today. Applied LLE CircAid over stockinett an rosidal foam. She would not fit over wraps. Cont as per POC atr current frequency to assist with LE  management to limitbinfection risk and progression.    OT Occupational Profile and History Comprehensive Assessment- Review of records and extensive additional review of physical, cognitive, psychosocial history related to current functional performance    Occupational Profile and client history currently impacting functional performance dimensia, inconsistent CG assistance with removing wraps, providing skin care and reapplying compression between clinical visits. S    Occupational performance deficits (Please refer to evaluation for details): ADL's;IADL's;Rest and Sleep;Play;Leisure;Social Participation;Other   body image   Body Structure / Function / Physical Skills ADL;Decreased knowledge of use of DME;Pain;Edema;Scar mobility;Decreased knowledge of precautions;Skin integrity    Rehab Potential Fair    Clinical Decision Making Multiple treatment options, significant modification of task necessary    Comorbidities Affecting Occupational Performance: Presence of comorbidities impacting occupational performance    Modification or Assistance to Complete Evaluation  Max significant modification of tasks or assist is necessary to complete    OT Frequency Other (comment)   every other week   OT Duration 12 weeks   and PRN ongoing to ensure optimal LE management and to support caregiver. Without ongoing skilled OT condition will worsen and further functional decline is expected   OT Treatment/Interventions Self-care/ADL training;DME and/or AE instruction;Manual lymph drainage;Patient/family education;Compression bandaging;Therapeutic exercise;Manual Therapy;Other (comment)    Plan skin care to decrease infection risk and improve hydration    Recommended Other Services fit with adjustable , alternative compression wraps for optimal independence w/ donning and doffing. Cnsider CircAid with footpieces.    Consulted and Agree with Plan of Care Patient;Family member/caregiver             Patient will  benefit from skilled therapeutic intervention in order to improve the following deficits and impairments:   Body Structure / Function / Physical Skills: ADL, Decreased knowledge of use of DME, Pain,  Edema, Scar mobility, Decreased knowledge of precautions, Skin integrity       Visit Diagnosis: Lymphedema, not elsewhere classified    Problem List Patient Active Problem List   Diagnosis Date Noted   PVD (peripheral vascular disease) ([place]) 04/27/2021   Medication noncompliance due to cognitive impairment 10/17/2019   Chronic pain of multiple joints 08/01/2017   Age-related osteoporosis without current pathological fracture 08/01/2017   Left knee pain 09/19/2016   Rectal benign neoplasm    Asthma, chronic 04/04/2015   history of Tubular adenoma of colon 04/04/2015   CKD (chronic kidney disease) stage 3, GFR 30-59 ml/min (HCC) 04/03/2015   Edema 73/75/0510   Chronic systolic heart failure ([place]) 11/20/2013   Lymphedema of lower extremity 10/17/2013   Psychotic paranoia ([place]) 04/04/2013   MGUS (monoclonal gammopathy of unknown significance) 05/15/2012   Vitamin D deficiency 01/29/2010   DERMATITIS 09/30/2009   Paranoid delusion ([place]) 08/02/2008   Obstructive chronic bronchitis ([place]) 08/02/2008   Essential hypertension 11/11/2007   Venous (peripheral) insufficiency 11/11/2007   RENAL CALCULUS 11/11/2007   LOW BACK PAIN, CHRONIC 11/11/2007    Andrey Spearman, MS, OTR/L, CLT-LANA 11/21/21 4:06 PM     [place] MAIN [facility] SERVICES [place], [place], [address] Phone: [phone]   Fax:  [phone]  Name: [name] MRN: [id number] Date of Birth: May 27, 1932

## 2021-12-05 ENCOUNTER — Ambulatory Visit: Payer: Medicare Other | Admitting: Occupational Therapy

## 2021-12-12 ENCOUNTER — Encounter: Payer: Medicare Other | Admitting: Occupational Therapy

## 2021-12-19 ENCOUNTER — Ambulatory Visit: Payer: Medicare Other | Attending: Family | Admitting: Occupational Therapy

## 2021-12-19 ENCOUNTER — Other Ambulatory Visit: Payer: Self-pay

## 2021-12-19 DIAGNOSIS — I890 Lymphedema, not elsewhere classified: Secondary | ICD-10-CM

## 2021-12-19 NOTE — Therapy (Signed)
[place] MAIN [facility] SERVICES [address] [place], [place], [address] Phone: [phone]   Fax:  [phone]  Occupational Therapy Treatment  Patient Details  Name: [name] MRN: [id number] Date of Birth: [birth year] No data recorded  Encounter Date: 12/19/2021   OT End of Session - 12/19/21 1408     Visit Number 12    Number of Visits 19    Date for OT Re-Evaluation 12/25/21    OT Start Time 0203    OT Stop Time 0305    OT Time Calculation (min) 62 min    Activity Tolerance Patient tolerated treatment well;Patient limited by pain;No increased pain    Behavior During Therapy WFL for tasks assessed/performed             Past Medical History:  Diagnosis Date   Anemia, unspecified    Anxiety    Arthritis    Asthma    Delusions ([place])    Depression    Edema 2005   "since put the glass in them"    GERD (gastroesophageal reflux disease)    Lumbago    Lymphedema    Obstructive chronic bronchitis without exacerbation ([place])    Other abnormal glucose    Psychotic paranoia ([place]) 01/05/8827   Systolic heart failure ([place])    EF 35 to 40% per echo January 2015   Unspecified essential hypertension    Unspecified nonpsychotic mental disorder    Unspecified venous (peripheral) insufficiency    Unspecified venous (peripheral) insufficiency    Unspecified vitamin D deficiency     Past Surgical History:  Procedure Laterality Date   APPENDECTOMY     CERVICAL SPINE SURGERY     x 2   CHOLECYSTECTOMY     ESOPHAGOGASTRODUODENOSCOPY N/A 04/05/2015   Procedure: ESOPHAGOGASTRODUODENOSCOPY (EGD);  Surgeon: [name], MD;  Location: [facility];  Service: Endoscopy;  Laterality: N/A;   FLEXIBLE SIGMOIDOSCOPY N/A 04/05/2015   Procedure: FLEXIBLE SIGMOIDOSCOPY;  Surgeon: [name], MD;  Location: [facility];  Service: Endoscopy;  Laterality: N/A;   HAND SURGERY     LUMBAR DISC SURGERY     PARTIAL HYSTERECTOMY     TONSILLECTOMY       There were no vitals filed for this visit.   Subjective Assessment - 12/19/21 1414     Subjective  Pt Ms. Manley presents for OT vist 49/100 to address BLE lymphedema. . Ms. Kreider is accompanied by her nephew, [name]. Pt reports 8/10 leg pain this afternoon. She was last seen for lymphedema care on 11/21/21.    Patient is accompanied by: Family member    Pertinent History Chronic obstructive bronchitis, CVI, anxiety , systoloc heart failure, asthma, arthritis, multiple spinal surgeries w/ chronic back pain, DM,    Limitations Difficulty walking, impaired standing tolerance, impaired transfers, decreased joint AROM at toes, ankles and knees, chronic leg swelling , chronic pain. increased infection and falls risks, cognitive impairment, generaized weakness    Repetition Increases Symptoms    Special Tests + Stemmer bilaterally    Patient Stated Goals reduce swelling and legs feel better    Currently in Pain? Yes    Pain Score 8     Pain Onset Other (comment)                          OT Treatments/Exercises (OP) - 12/19/21 1619       Manual Therapy   Manual Therapy Edema management;Manual Lymphatic  Drainage (MLD);Compression Bandaging    Manual Lymphatic Drainage (MLD) MLD to LLE as established. Pants worn to clinic severly limit access to inguinal LN      for proximal MLD.    Compression Bandaging Rosidal foal         applied in single layer circumferentially over cotton stockinet from base of toes to below knee. These positioned under CircAid reductions kits on both L and R legs.    Other Manual Therapy skin care to LLE with low ph castor oil concurrent w MLD                         OT Long Term Goals - 07/19/21 7169       OT LONG TERM GOAL #1   Title With maximum caregiver assistance Pt will be able to apply knee length, multi-layer, short stretch compression wraps daily from ankle to tibial tuberosity on one leg at a time using correct gradient  techniques to return affected limb/s, as closely as possible, to premorbid size and shape, to limit leg pain and infection risk, and to improve safe functional mobility and ADLs performance. 01/03/21: Manufacturer's rep coming next week to assist with fitting adjustable, Velcro style , knee length compression leg wraps. Hopefiullly Pt will be able to manage these more independently, and may be more do-able for caregivers. Drawback is they do not manage foot swelling well.    Baseline dependent    Time 4    Period Days    Status Not Met   CG trained to wrap correctly and to don afdjustable daytime garments, , but Pt remains noncompliant with daily compression.     OT LONG TERM GOAL #2   Title With Maximum caregiver assistance Pt will be able to verbalize signs and symptoms of cellulitis infection and identify lymphedema precautions using printed resource (modified independence) for reference to decrease infection risk and limit LE progression over time.    Baseline dependent    Time 6    Period Days    Status Achieved      OT LONG TERM GOAL #3   Title With Maximum caregiver assistance Pt >85 % compliant with all daily, LE self-care protocols for home program, including simple self-manual lymphatic drainage (MLD), skin care, lymphatic pumping ther ex, skin care, and compression wraps and garments  to limit LE progression and further functional decline.    Baseline dependent    Time 12    Period Weeks    Status Not Met   Pt lives alone and has no consistent CG assistance with skin care, compression wraps and / or daytime garments, ther ex and MLD     OT LONG TERM GOAL #4   Baseline dependent    Time 12    Period Weeks    Status Deferred      OT LONG TERM GOAL #5   Title Pt to tolerate compression wraps/ garments in keeping w/ prescribed daily wear regime within 1 week of issue date to progress and retain clinical and functional gains and to limit LE progression.    Baseline dependent    Time  12    Period Weeks    Status Achieved   Met, but non-compliant with daytime compression and wraps     OT LONG TERM GOAL #6   Title Pt to achieve at least 10% LLE limb volume reduction bilaterally during modified Intensive Phase CDT to reduce infection risk, risk of recurrent  leg wounds, to improve functional performance of basic and instrumental ADLs, and to limit LE progression.    Baseline dependent    Time 12    Period Weeks    Status Partially Met   04/25/21 BLE clearly more swollen today; by visual assessment I estimate 15-20% increase on L and 10% increase on R from distal legs to toes.Volumetrics not taken today due to time constraints.                  Plan - 12/19/21 1615     Clinical Impression Statement Lymphedema is well managed bilaterally today. Skin is very dry and gflakey as is typical, but swelling is decreased overall. CG reprts they are bathing legs 2 x weekly and usinmg compression at same frequency. OT requested they increase that to 3x weekly with caregivcer and also when family members are with Pamala Hurry. He agreed with plan. Pt tolerated MLD and simultaneous skin care to LLR. Appliefd Rosidal foam layer, then CircAid     wraps over. Pt able to fit recently purchased street shoes. Cont OT every other week  for management support with this intractable case of severe BLE lymphedema to limit progression, development of wounds and to limit infection risk.    OT Occupational Profile and History Comprehensive Assessment- Review of records and extensive additional review of physical, cognitive, psychosocial history related to current functional performance    Occupational Profile and client history currently impacting functional performance dimensia, inconsistent CG assistance with removing wraps, providing skin care and reapplying compression between clinical visits. S    Occupational performance deficits (Please refer to evaluation for details): ADL's;IADL's;Rest and  Sleep;Play;Leisure;Social Participation;Other   body image   Body Structure / Function / Physical Skills ADL;Decreased knowledge of use of DME;Pain;Edema;Scar mobility;Decreased knowledge of precautions;Skin integrity    Rehab Potential Fair    Clinical Decision Making Multiple treatment options, significant modification of task necessary    Comorbidities Affecting Occupational Performance: Presence of comorbidities impacting occupational performance    Modification or Assistance to Complete Evaluation  Max significant modification of tasks or assist is necessary to complete    OT Frequency Other (comment)   every other week   OT Duration 12 weeks   and PRN ongoing to ensure optimal LE management and to support caregiver. Without ongoing skilled OT condition will worsen and further functional decline is expected   OT Treatment/Interventions Self-care/ADL training;DME and/or AE instruction;Manual lymph drainage;Patient/family education;Compression bandaging;Therapeutic exercise;Manual Therapy;Other (comment)    Plan skin care to decrease infection risk and improve hydration    Recommended Other Services fit with adjustable , alternative compression wraps for optimal independence w/ donning and doffing. Cnsider CircAid with footpieces.    Consulted and Agree with Plan of Care Patient;Family member/caregiver             Patient will benefit from skilled therapeutic intervention in order to improve the following deficits and impairments:   Body Structure / Function / Physical Skills: ADL, Decreased knowledge of use of DME, Pain, Edema, Scar mobility, Decreased knowledge of precautions, Skin integrity       Visit Diagnosis: Lymphedema, not elsewhere classified    Problem List Patient Active Problem List   Diagnosis Date Noted   PVD (peripheral vascular disease) ([place]) 04/27/2021   Medication noncompliance due to cognitive impairment 10/17/2019   Chronic pain of multiple joints 08/01/2017    Age-related osteoporosis without current pathological fracture 08/01/2017   Left knee pain 09/19/2016   Rectal benign neoplasm  Asthma, chronic 04/04/2015   history of Tubular adenoma of colon 04/04/2015   CKD (chronic kidney disease) stage 3, GFR 30-59 ml/min (HCC) 04/03/2015   Edema 38/18/2993   Chronic systolic heart failure ([place]) 11/20/2013   Lymphedema of lower extremity 10/17/2013   Psychotic paranoia ([place]) 04/04/2013   MGUS (monoclonal gammopathy of unknown significance) 05/15/2012   Vitamin D deficiency 01/29/2010   DERMATITIS 09/30/2009   Paranoid delusion ([place]) 08/02/2008   Obstructive chronic bronchitis ([place]) 08/02/2008   Essential hypertension 11/11/2007   Venous (peripheral) insufficiency 11/11/2007   RENAL CALCULUS 11/11/2007   LOW BACK PAIN, CHRONIC 11/11/2007   Andrey Spearman, MS, OTR/L, CLT-LANA 12/19/21 4:21 PM   [place] MAIN [facility] SERVICES [address] [place], [place], [address] Phone: [phone]   Fax:  [phone]  Name: [name] MRN: [id number] Date of Birth: [birth year]

## 2021-12-19 NOTE — Patient Instructions (Signed)

## 2021-12-28 ENCOUNTER — Encounter: Payer: Medicare Other | Admitting: Occupational Therapy

## 2022-01-04 ENCOUNTER — Encounter: Payer: Medicare Other | Admitting: Occupational Therapy

## 2022-01-09 ENCOUNTER — Ambulatory Visit: Payer: Medicare Other | Attending: Family | Admitting: Occupational Therapy

## 2022-01-09 ENCOUNTER — Other Ambulatory Visit: Payer: Self-pay

## 2022-01-09 ENCOUNTER — Encounter: Payer: Medicare Other | Admitting: Occupational Therapy

## 2022-01-09 DIAGNOSIS — I890 Lymphedema, not elsewhere classified: Secondary | ICD-10-CM | POA: Insufficient documentation

## 2022-01-09 NOTE — Therapy (Signed)
[place] MAIN [facility] SERVICES [address] [place], [place], [address] Phone: [phone]   Fax:  [phone]  Occupational Therapy Treatment Note and Progress Report: BLE Lymphedema Care  Patient Details  Name: [name] MRN: [id number] Date of Birth: 02-14-1932  Reporting Period: -01/09/22  Encounter Date: 07/19/21 - 01/09/2022   OT End of Session - 01/09/22 1420     Visit Number 50    Number of Visits 72    Date for OT Re-Evaluation 04/09/22    OT Start Time 0207    OT Stop Time 0315    OT Time Calculation (min) 68 min    Activity Tolerance Patient tolerated treatment well;Patient limited by pain;No increased pain    Behavior During Therapy WFL for tasks assessed/performed             Past Medical History:  Diagnosis Date   Anemia, unspecified    Anxiety    Arthritis    Asthma    Delusions ([place])    Depression    Edema 2005   "since put the glass in them"    GERD (gastroesophageal reflux disease)    Lumbago    Lymphedema    Obstructive chronic bronchitis without exacerbation ([place])    Other abnormal glucose    Psychotic paranoia ([place]) 05/30/174   Systolic heart failure ([place])    EF 35 to 40% per echo January 2015   Unspecified essential hypertension    Unspecified nonpsychotic mental disorder    Unspecified venous (peripheral) insufficiency    Unspecified venous (peripheral) insufficiency    Unspecified vitamin D deficiency     Past Surgical History:  Procedure Laterality Date   APPENDECTOMY     CERVICAL SPINE SURGERY     x 2   CHOLECYSTECTOMY     ESOPHAGOGASTRODUODENOSCOPY N/A 04/05/2015   Procedure: ESOPHAGOGASTRODUODENOSCOPY (EGD);  Surgeon: [name], MD;  Location: [facility];  Service: Endoscopy;  Laterality: N/A;   FLEXIBLE SIGMOIDOSCOPY N/A 04/05/2015   Procedure: FLEXIBLE SIGMOIDOSCOPY;  Surgeon: [name], MD;  Location: [facility];  Service: Endoscopy;  Laterality: N/A;   HAND SURGERY      LUMBAR DISC SURGERY     PARTIAL HYSTERECTOMY     TONSILLECTOMY      There were no vitals filed for this visit.   Subjective Assessment - 01/09/22 1422     Subjective  [name] presents for OT vist 50/100 to address BLE lymphedema. . [name] is accompanied by her nephew, [name]. Pt reports 5/10 leg pain this afternoon. "My back is what;'s bothering me now."    Patient is accompanied by: Family member    Pertinent History Chronic obstructive bronchitis, CVI, anxiety , systoloc heart failure, asthma, arthritis, multiple spinal surgeries w/ chronic back pain, DM,    Limitations Difficulty walking, impaired standing tolerance, impaired transfers, decreased joint AROM at toes, ankles and knees, chronic leg swelling , chronic pain. increased infection and falls risks, cognitive impairment, generaized weakness    Repetition Increases Symptoms    Special Tests + Stemmer bilaterally    Patient Stated Goals reduce swelling and legs feel better    Pain Onset Other (comment)                 LYMPHEDEMA/ONCOLOGY QUESTIONNAIRE - 01/09/22 1524       Lymphedema Assessments   Lymphedema Assessments Lower extremities      Right Lower Extremity Lymphedema   Other R leg from ankle to  tibia tuberosity (A-D) measures 3070.75m.    Other RLE limb volume is DECREASED by 16.9% since last measured      Left Lower Extremity Lymphedema   Other L leg from ankle to tibia tuberosity (A-D) measures 3556.7 ml.    Other LLE limb volume is DECreased  by 17.45 % since last measured on 07/20/21.                     OT Treatments/Exercises (OP) - 01/09/22 1524       ADLs   ADL Education Given Yes      Manual Therapy   Manual Therapy Edema management    Edema Management BLE comparative limb volumetrics    Compression Bandaging Rosidal foal         applied in single layer circumferentially over cotton stockinet from base of toes to below knee. These positioned under CircAid reductions  kits on both L and R legs.                    OT Education - 01/09/22 1527     Education provided Yes    Education Details Continued Pt/ CG edu for lymphedema self care  and home program throughout session. Topics include multilayer, gradient compression wrapping, simple self-MLD, therapeutic lymphatic pumping exercises, skin/nail care, risk reduction factors and LE precautions, compression garments/recommendations and wear and care schedule and compression garment donning / doffing using assistive devices. All questions answered to the Pt's satisfaction, and Pt demonstrates understanding by report.    Person(s) Educated Patient;Caregiver(s)    Methods Explanation;Demonstration    Comprehension Verbalized understanding;Returned demonstration;Need further instruction                 OT Long Term Goals - 01/09/22 1530       OT LONG TERM GOAL #1   Title With maximum caregiver assistance Pt will be able to apply knee length, multi-layer, short stretch compression wraps daily from ankle to tibial tuberosity on one leg at a time using correct gradient techniques to return affected limb/s, as closely as possible, to premorbid size and shape, to limit leg pain and infection risk, and to improve safe functional mobility and ADLs performance. 01/03/21: Manufacturer's rep coming next week to assist with fitting adjustable, Velcro style , knee length compression leg wraps. Hopefiullly Pt will be able to manage these more independently, and may be more do-able for caregivers. Drawback is they do not manage foot swelling well.    Baseline dependent    Time 4    Period Days    Status Revised   CG has never been comfortable applying compression wraps, but he has become quite comfortable applying adjustable, velcro wrap style compression leggings, and does so 3 or more times weekl since 06/2021.     OT LONG TERM GOAL #2   Title With Maximum caregiver assistance Pt will be able to verbalize  signs and symptoms of cellulitis infection and identify lymphedema precautions using printed resource (modified independence) for reference to decrease infection risk and limit LE progression over time.    Baseline dependent    Time 6    Period Days    Status Achieved      OT LONG TERM GOAL #3   Title With Maximum caregiver assistance Pt >85 % compliant with all daily, LE self-care protocols for home program, including simple self-manual lymphatic drainage (MLD), skin care, lymphatic pumping ther ex, skin care, and compression wraps and garments  to limit LE progression and further functional decline.    Baseline dependent    Time 12    Period Weeks    Status Revised   Previously: Pt lives alone and has no consistent CG assistance w LE self-care.01/09/22 : At present Pt is dramatically increasing compliance with regular and more frequent skin care and compression , at least 3 x weekly with assistance of nephew and wife.     OT LONG TERM GOAL #4   Baseline dependent    Time 12    Period Weeks    Status Deferred      OT LONG TERM GOAL #5   Title Pt to tolerate compression wraps/ garments in keeping w/ prescribed daily wear regime within 1 week of issue date to progress and retain clinical and functional gains and to limit LE progression.    Baseline dependent    Time 12    Period Weeks    Status Achieved   Met, but non-compliant with daytime compression and wraps     OT LONG TERM GOAL #6   Title Pt to achieve at least 10% LLE limb volume reduction bilaterally during modified Intensive Phase CDT to reduce infection risk, risk of recurrent leg wounds, to improve functional performance of basic and instrumental ADLs, and to limit LE progression.    Baseline dependent    Time 12    Period Weeks    Status Achieved   01/09/22: Exciting achievement this date! Pt achieved 16.9% and 17.45% volume reductions since 06/2021 in R and L legs respectively. Best reductions ever achieved since 2019 with  more frequent CG assistance at home.                  Plan - 01/09/22 1516     Clinical Impression Statement BLE comparative limb volumetrics reveal 17% limb volume reductions bilaterally since last measured. Pt's nephew and his wife started taking a more active roll in skin care and compression during visit intervals after OT reduced frequency to q 2-3 weeks late in 2022. Volumetric numbers today are the lowest they have been since commencing CDT with Ms.Drake Leach in 2019. Skin condition is much improved. Legs are cool to the touch with excessive flaking much reduced. There are no areas of scabs from scratching and skin wrinkles aree visible to the knees evidencing volume reuction. Please Refer to LONG TERM GOALS section of this Progress Note for additional details of progress to date. No time remaining after limb volumetrics to provide MLD, so reapplied wrap style, adjustable CircAids and ended session with great praise for excellent improvement over the last  few months with more frequent use of compression and more regular and diligent skin care between OT sessions. Cont q 2-3 weeks to support careegivers. Knee length CircAid garments are showing signs of wear and will need replacement withing the next couple of months. Pt continues to benefit from q 2-3 week intervals for follow-along OT support for improving LE self management at home. Will monitor condition and increase frequency as weather warms up and lymphedema tends to become exacerbated in many.    OT Occupational Profile and History Comprehensive Assessment- Review of records and extensive additional review of physical, cognitive, psychosocial history related to current functional performance    Occupational Profile and client history currently impacting functional performance dimensia, inconsistent CG assistance with removing wraps, providing skin care and reapplying compression between clinical visits. S    Occupational performance  deficits (Please refer to evaluation  for details): ADL's;IADL's;Rest and Sleep;Play;Leisure;Social Participation;Other   body image   Body Structure / Function / Physical Skills ADL;Decreased knowledge of use of DME;Pain;Edema;Scar mobility;Decreased knowledge of precautions;Skin integrity    Rehab Potential Fair    Clinical Decision Making Multiple treatment options, significant modification of task necessary    Comorbidities Affecting Occupational Performance: Presence of comorbidities impacting occupational performance    Modification or Assistance to Complete Evaluation  Max significant modification of tasks or assist is necessary to complete    OT Frequency Other (comment)   every other week   OT Duration 12 weeks   and PRN ongoing to ensure optimal LE management and to support caregiver. Without ongoing skilled OT condition will worsen and further functional decline is expected   OT Treatment/Interventions Self-care/ADL training;DME and/or AE instruction;Manual lymph drainage;Patient/family education;Compression bandaging;Therapeutic exercise;Manual Therapy;Other (comment)    Plan skin care to decrease infection risk and improve hydration    Recommended Other Services fit with adjustable , alternative compression wraps for optimal independence w/ donning and doffing. Cnsider CircAid with footpieces.    Consulted and Agree with Plan of Care Patient;Family member/caregiver             Patient will benefit from skilled therapeutic intervention in order to improve the following deficits and impairments:   Body Structure / Function / Physical Skills: ADL, Decreased knowledge of use of DME, Pain, Edema, Scar mobility, Decreased knowledge of precautions, Skin integrity       Visit Diagnosis: Lymphedema, not elsewhere classified    Problem List Patient Active Problem List   Diagnosis Date Noted   PVD (peripheral vascular disease) ([place]) 04/27/2021   Medication noncompliance due to  cognitive impairment 10/17/2019   Chronic pain of multiple joints 08/01/2017   Age-related osteoporosis without current pathological fracture 08/01/2017   Left knee pain 09/19/2016   Rectal benign neoplasm    Asthma, chronic 04/04/2015   history of Tubular adenoma of colon 04/04/2015   CKD (chronic kidney disease) stage 3, GFR 30-59 ml/min (HCC) 04/03/2015   Edema 48/27/0786   Chronic systolic heart failure ([place]) 11/20/2013   Lymphedema of lower extremity 10/17/2013   Psychotic paranoia ([place]) 04/04/2013   MGUS (monoclonal gammopathy of unknown significance) 05/15/2012   Vitamin D deficiency 01/29/2010   DERMATITIS 09/30/2009   Paranoid delusion ([place]) 08/02/2008   Obstructive chronic bronchitis ([place]) 08/02/2008   Essential hypertension 11/11/2007   Venous (peripheral) insufficiency 11/11/2007   RENAL CALCULUS 11/11/2007   LOW BACK PAIN, CHRONIC 11/11/2007    Andrey Spearman, MS, OTR/L, CLT-LANA 01/09/22 3:41 PM   [place] [facility] MAIN [facility] SERVICES [place], [place], [address] Phone: [phone]   Fax:  [phone]  Name: [name] MRN: [id number] Date of Birth: [birth year]

## 2022-01-18 ENCOUNTER — Encounter: Payer: Medicare Other | Admitting: Occupational Therapy

## 2022-01-23 ENCOUNTER — Ambulatory Visit: Payer: Medicare Other | Admitting: Occupational Therapy

## 2022-01-23 ENCOUNTER — Other Ambulatory Visit: Payer: Self-pay

## 2022-01-23 ENCOUNTER — Encounter: Payer: Medicare Other | Admitting: Occupational Therapy

## 2022-01-23 DIAGNOSIS — I890 Lymphedema, not elsewhere classified: Secondary | ICD-10-CM | POA: Diagnosis not present

## 2022-01-25 NOTE — Therapy (Signed)
[facility] ?[place], [place], [address] ?Phone: [phone]   Fax:  [phone] ? ?Occupational Therapy Treatment ? ?Patient Details  ?Name: [name] ?MRN: [id number] ?Date of Birth: [birth year] ?No data recorded ? ?Encounter Date: 01/23/2022 ? ? OT End of Session - 01/25/22 1302   ? ? Visit Number 51   ? Number of Visits 72   ? Date for OT Re-Evaluation 04/09/22   ? OT Start Time 0207   ? OT Stop Time 0306   ? OT Time Calculation (min) 59 min   ? Activity Tolerance Patient tolerated treatment well;Patient limited by pain;No increased pain   ? Behavior During Therapy [facility] for tasks assessed/performed   ? ?  ?  ? ?  ? ? ?Past Medical History:  ?Diagnosis Date  ? Anemia, unspecified   ? Anxiety   ? Arthritis   ? Asthma   ? Delusions ([place])   ? Depression   ? Edema 2005  ? "since put the glass in them"   ? GERD (gastroesophageal reflux disease)   ? Lumbago   ? Lymphedema   ? Obstructive chronic bronchitis without exacerbation ([place])   ? Other abnormal glucose   ? 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 Systolic heart failure ([place])   ? EF 35 to 40% per echo January 2015  ? Unspecified essential hypertension   ? Unspecified nonpsychotic mental disorder   ? Unspecified venous (peripheral) insufficiency   ? Unspecified venous (peripheral) insufficiency   ? Unspecified vitamin D deficiency   ? ? ?Past Surgical History:  ?Procedure Laterality Date  ? APPENDECTOMY    ? CERVICAL SPINE SURGERY    ? x 2  ? CHOLECYSTECTOMY    ? ESOPHAGOGASTRODUODENOSCOPY N/A 04/05/2015  ? Procedure: ESOPHAGOGASTRODUODENOSCOPY (EGD);  Surgeon: [name], MD;  Location: [facility];  Service: Endoscopy;  Laterality: N/A;  ? FLEXIBLE SIGMOIDOSCOPY N/A 04/05/2015  ? Procedure: FLEXIBLE SIGMOIDOSCOPY;  Surgeon: [name], MD;  Location: [facility];  Service: Endoscopy;  Laterality: N/A;  ? HAND SURGERY    ? LUMBAR DISC SURGERY    ? PARTIAL HYSTERECTOMY    ? TONSILLECTOMY     ? ? ?There were no vitals filed for this visit. ? ? Subjective Assessment - 01/25/22 1309   ? ? Subjective  [name] presents for OT vist 51/100 to address BLE lymphedema. . [name] is accompanied by her niece, [name]. Pt reports 7/10 leg pain this afternoon.   ? Patient is accompanied by: Family member   ? Pertinent History Chronic obstructive bronchitis, CVI, anxiety , systoloc heart failure, asthma, arthritis, multiple spinal surgeries w/ chronic back pain, DM,   ? Limitations Difficulty walking, impaired standing tolerance, impaired transfers, decreased joint AROM at toes, ankles and knees, chronic leg swelling , chronic pain. increased infection and falls risks, cognitive impairment, generaized weakness   ? Repetition Increases Symptoms   ? Special Tests + [place] bilaterally   ? Patient Stated Goals reduce swelling and legs feel better   ? Pain Onset Other (comment)   ? ?  ?  ? ?  ? ? ? ? ? ? ? ? ? ? ? ? ? ? ? OT Treatments/Exercises (OP) - 01/25/22 1309   ? ?  ? ADLs  ? ADL Education Given Yes   ?  ? Manual Therapy  ? Manual Therapy Edema management   ? Manual Lymphatic Drainage (MLD) MLD to LLE as established.  Pants worn to clinic severly limit access to inguinal LN      for proximal MLD.   ? Compression Bandaging Rosidal foam applied in single layer circumferentially over cotton stockinet from base of toes to below knee. These positioned under CircAid reductions kits on both L and R legs.   ? Other Manual Therapy skin care to LLE with low ph castor oil concurrent w MLD   ? ?  ?  ? ?  ? ? ? ? ? ? ? ? ? OT Education - 01/25/22 1310   ? ? Education provided Yes   ? Education Details Continued Pt/ CG edu for lymphedema self care  and home program throughout session. Topics include multilayer, gradient compression wrapping, simple self-MLD, therapeutic lymphatic pumping exercises, skin/nail care, risk reduction factors and LE precautions, compression garments/recommendations and wear and care schedule  and compression garment donning / doffing using assistive devices. All questions answered to the Pt's satisfaction, and Pt demonstrates understanding by report.   ? Person(s) Educated Associate Professor)   ? Methods Explanation;Demonstration   ? Comprehension Verbalized understanding;Returned demonstration;Need further instruction   ? Person(s) Educated Patient;Other (comment)   niece from out of town  ? Methods Explanation;Demonstration;Handout   ? Comprehension Verbalized understanding;Need further instruction   ? ?  ?  ? ?  ? ? ? ? ? ? OT Long Term Goals - 01/09/22 1530   ? ?  ? OT LONG TERM GOAL #1  ? Title With maximum caregiver assistance Pt will be able to apply knee length, multi-layer, short stretch compression wraps daily from ankle to tibial tuberosity on one leg at a time using correct gradient techniques to return affected limb/s, as closely as possible, to premorbid size and shape, to limit leg pain and infection risk, and to improve safe functional mobility and ADLs performance. 01/03/21: Manufacturer's rep coming next week to assist with fitting adjustable, Velcro style , knee length compression leg wraps. Hopefiullly Pt will be able to manage these more independently, and may be more do-able for caregivers. Drawback is they do not manage foot swelling well.   ? Baseline dependent   ? Time 4   ? Period Days   ? Status Revised   CG has never been comfortable applying compression wraps, but he has become quite comfortable applying adjustable, velcro wrap style compression leggings, and does so 3 or more times weekl since 06/2021.  ?  ? OT LONG TERM GOAL #2  ? Title With Maximum caregiver assistance Pt will be able to verbalize signs and symptoms of cellulitis infection and identify lymphedema precautions using printed resource (modified independence) for reference to decrease infection risk and limit LE progression over time.   ? Baseline dependent   ? Time 6   ? Period Days   ? Status Achieved   ?  ? OT  LONG TERM GOAL #3  ? Title With Maximum caregiver assistance Pt >85 % compliant with all daily, LE self-care protocols for home program, including simple self-manual lymphatic drainage (MLD), skin care, lymphatic pumping ther ex, skin care, and compression wraps and garments  to limit LE progression and further functional decline.   ? Baseline dependent   ? Time 12   ? Period Weeks   ? Status Revised   Previously: Pt lives alone and has no consistent CG assistance w LE self-care.01/09/22 : At present Pt is dramatically increasing compliance with regular and more frequent skin care and compression , at least 3 x weekly with  assistance of nephew and wife.  ?  ? OT LONG TERM GOAL #4  ? Baseline dependent   ? Time 12   ? Period Weeks   ? Status Deferred   ?  ? OT LONG TERM GOAL #5  ? Title Pt to tolerate compression wraps/ garments in keeping w/ prescribed daily wear regime within 1 week of issue date to progress and retain clinical and functional gains and to limit LE progression.   ? Baseline dependent   ? Time 12   ? Period Weeks   ? Status Achieved   Met, but non-compliant with daytime compression and wraps  ?  ? OT LONG TERM GOAL #6  ? Title Pt to achieve at least 10% LLE limb volume reduction bilaterally during modified Intensive Phase CDT to reduce infection risk, risk of recurrent leg wounds, to improve functional performance of basic and instrumental ADLs, and to limit LE progression.   ? Baseline dependent   ? Time 12   ? Period Weeks   ? Status Achieved   01/09/22: Exciting achievement this date! Pt achieved 16.9% and 17.45% volume reductions since 06/2021 in R and L legs respectively. Best reductions ever achieved since 2019 with more frequent CG assistance at home.  ? ?  ?  ? ?  ? ? ? ? ? ? ? ? Plan - 01/25/22 0931   ? ? Clinical Impression Statement [name] continues to demonstrate improvement in skin condition and swelling control with her nephew, [name], and his spouse's increased frequency of  assistance  with self-management between visits. Skin hydration is increased with less flaking and seeping. Legs may be less itchy as well as Pt is scratchjing less and tearing skin as a result. Nails are well

## 2022-01-27 ENCOUNTER — Encounter: Payer: Self-pay | Admitting: Nurse Practitioner

## 2022-01-27 ENCOUNTER — Ambulatory Visit (INDEPENDENT_AMBULATORY_CARE_PROVIDER_SITE_OTHER): Payer: Medicare Other | Admitting: Nurse Practitioner

## 2022-01-27 VITALS — BP 140/82 | HR 60 | Temp 97.3°F | Ht 59.0 in | Wt 134.0 lb

## 2022-01-27 DIAGNOSIS — F22 Delusional disorders: Secondary | ICD-10-CM | POA: Diagnosis not present

## 2022-01-27 DIAGNOSIS — J449 Chronic obstructive pulmonary disease, unspecified: Secondary | ICD-10-CM | POA: Diagnosis not present

## 2022-01-27 DIAGNOSIS — J4489 Other specified chronic obstructive pulmonary disease: Secondary | ICD-10-CM

## 2022-01-27 DIAGNOSIS — B351 Tinea unguium: Secondary | ICD-10-CM

## 2022-01-27 DIAGNOSIS — D509 Iron deficiency anemia, unspecified: Secondary | ICD-10-CM

## 2022-01-27 DIAGNOSIS — L84 Corns and callosities: Secondary | ICD-10-CM | POA: Diagnosis not present

## 2022-01-27 DIAGNOSIS — I10 Essential (primary) hypertension: Secondary | ICD-10-CM | POA: Diagnosis not present

## 2022-01-27 DIAGNOSIS — I5022 Chronic systolic (congestive) heart failure: Secondary | ICD-10-CM

## 2022-01-27 DIAGNOSIS — I890 Lymphedema, not elsewhere classified: Secondary | ICD-10-CM

## 2022-01-27 MED ORDER — ALBUTEROL SULFATE HFA 108 (90 BASE) MCG/ACT IN AERS: INHALATION_SPRAY | RESPIRATORY_TRACT | 5 refills | Status: DC

## 2022-01-27 MED ORDER — OLANZAPINE 5 MG PO TABS: ORAL_TABLET | ORAL | 3 refills | Status: DC

## 2022-01-27 MED ORDER — POTASSIUM CHLORIDE CRYS ER 20 MEQ PO TBCR: 10.0000 meq | EXTENDED_RELEASE_TABLET | Freq: Every day | ORAL | 3 refills | Status: DC

## 2022-01-27 MED ORDER — TORSEMIDE 20 MG PO TABS: 10.0000 mg | ORAL_TABLET | Freq: Every day | ORAL | 3 refills | Status: DC

## 2022-01-27 MED ORDER — FERROUS SULFATE 325 (65 FE) MG PO TABS: ORAL_TABLET | ORAL | 3 refills | Status: DC

## 2022-01-27 MED ORDER — CETIRIZINE HCL 10 MG PO TABS: 10.0000 mg | ORAL_TABLET | ORAL | 1 refills | Status: DC | PRN

## 2022-01-27 MED ORDER — HYDRALAZINE HCL 50 MG PO TABS: ORAL_TABLET | ORAL | 3 refills | Status: DC

## 2022-01-27 MED ORDER — CARVEDILOL 6.25 MG PO TABS: ORAL_TABLET | ORAL | 1 refills | Status: DC

## 2022-01-27 MED ORDER — ADVAIR HFA 115-21 MCG/ACT IN AERO: INHALATION_SPRAY | RESPIRATORY_TRACT | 5 refills | Status: DC

## 2022-01-27 NOTE — Progress Notes (Signed)
? ? ?Careteam: ?Patient Care Team: ?Lauree Chandler, NP as PCP - General (Geriatric Medicine) ?[name], MD (Pulmonary Disease) ?Jacolyn Reedy, MD as Consulting Physician (Cardiology) ? ?PLACE OF SERVICE:  ?[facility]  ?Advanced Directive information ?Does Patient Have a Medical Advance Directive?: Yes, Type of Advance Directive: [place], Does patient want to make changes to medical advance directive?: No - Patient declined ? ?Allergies  ?Allergen Reactions  ? Aspirin Other (See Comments)  ?  REACTION: upset stomach  ? Codeine Other (See Comments)  ?  REACTION: "MAKES MY BODY GO CRAZY; CRAMPS"  ? Latex Nausea And Vomiting  ? ? ?Chief Complaint  ?Patient presents with  ? Medical Management of Chronic Issues  ?  5 month follow-up. Discuss need for shingrix and additional covid boosters. Patient denies receiving any vaccines since last visit. NCIR verified. Examine left foot. Here with Niece [place]   ? ? ? ?HPI: Patient is a 88 y.o. female for routine follow up ? ?Here with niece, she visits from [facility] frequently and helps take care of her.  ? ?Having pain to bottom of left foot. Sore area, callus and corns.  ? ?COPD_ stable- occasaional wheezing and will use albuterol a few times a week.  ? ?Hallucinations /delusions are stable.  ? ?Continues on iron for anemia.  ? ?No worsening LE edema.  ? ?Review of Systems:  ?Review of Systems  ?Constitutional:  Negative for chills, fever and weight loss.  ?HENT:  Negative for tinnitus.   ?Respiratory:  Negative for cough, sputum production and shortness of breath.   ?Cardiovascular:  Negative for chest pain, palpitations and leg swelling.  ?Gastrointestinal:  Negative for abdominal pain, constipation, diarrhea and heartburn.  ?Genitourinary:  Negative for dysuria, frequency and urgency.  ?Musculoskeletal:  Negative for back pain, falls, joint pain and myalgias.  ?Skin: Negative.   ?Neurological:  Negative for dizziness and headaches.   ?Psychiatric/Behavioral:  Negative for depression and memory loss. The patient does not have insomnia.   ?     Occasional delusions  ? ?Past Medical History:  ?Diagnosis Date  ? Anemia, unspecified   ? Anxiety   ? Arthritis   ? Asthma   ? Delusions ([place])   ? Depression   ? Edema 2005  ? "since put the glass in them"   ? GERD (gastroesophageal reflux disease)   ? Lumbago   ? Lymphedema   ? Obstructive chronic bronchitis without exacerbation ([place])   ? Other abnormal glucose   ? 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 Systolic heart failure ([place])   ? EF 35 to 40% per echo January 2015  ? Unspecified essential hypertension   ? Unspecified nonpsychotic mental disorder   ? Unspecified venous (peripheral) insufficiency   ? Unspecified venous (peripheral) insufficiency   ? Unspecified vitamin D deficiency   ? ?Past Surgical History:  ?Procedure Laterality Date  ? APPENDECTOMY    ? CERVICAL SPINE SURGERY    ? x 2  ? CHOLECYSTECTOMY    ? ESOPHAGOGASTRODUODENOSCOPY N/A 04/05/2015  ? Procedure: ESOPHAGOGASTRODUODENOSCOPY (EGD);  Surgeon: [name], MD;  Location: [facility];  Service: Endoscopy;  Laterality: N/A;  ? FLEXIBLE SIGMOIDOSCOPY N/A 04/05/2015  ? Procedure: FLEXIBLE SIGMOIDOSCOPY;  Surgeon: [name], MD;  Location: [facility];  Service: Endoscopy;  Laterality: N/A;  ? HAND SURGERY    ? LUMBAR DISC SURGERY    ? PARTIAL HYSTERECTOMY    ? TONSILLECTOMY    ? ?Social History: ?  reports that she has never smoked. She has never used smokeless tobacco. She reports that she does not drink alcohol and does not use drugs. ? ?Family History  ?Problem Relation Age of Onset  ? Diabetes Mother   ? Arthritis Father   ? Diabetes Paternal Grandfather   ?     entire family on both sides  ? Heart disease Maternal Aunt   ?     entire family of both sides  ? Cancer Paternal Aunt   ?     type unknown  ? Diabetes Sister   ? Breast cancer Cousin   ? ? ?Medications: ?Patient's Medications  ?New Prescriptions  ? No medications on  file  ?Previous Medications  ? ACETAMINOPHEN (TYLENOL) 650 MG CR TABLET    Take 650 mg by mouth as needed for pain.  ? ALBUTEROL (PROAIR HFA) 108 (90 BASE) MCG/ACT INHALER    INHALE 2 PUFFS EVERY 4 HOURS AS NEEDED FOR WHEEZING  ? CARVEDILOL (COREG) 6.25 MG TABLET    TAKE 1 TABLET BY MOUTH TWICE DAILY WITH LUNCH AND SUPPER  ? CETIRIZINE (ZYRTEC) 10 MG TABLET    Take 10 mg by mouth as needed for allergies.  ? DENOSUMAB (PROLIA) 60 MG/ML SOSY INJECTION    Inject 60 mg into the skin every 6 (six) months.  ? FEROSUL 325 (65 FE) MG TABLET    TAKE 1 TABLET(325 MG) BY MOUTH DAILY WITH BREAKFAST  ? FLUTICASONE-SALMETEROL (ADVAIR HFA) 115-21 MCG/ACT INHALER    INHALE 2 PUFFS BY MOUTH TWICE DAILY  ? HYDRALAZINE (APRESOLINE) 50 MG TABLET    TAKE 1 TABLET BY MOUTH TWICE DAILY AT 8 AM AND 10 PM  ? OLANZAPINE (ZYPREXA) 5 MG TABLET    TAKE 1 TABLET(5 MG) BY MOUTH AT BEDTIME  ? OMEPRAZOLE (PRILOSEC) 20 MG CAPSULE    TAKE 1 CAPSULE(20 MG) BY MOUTH DAILY  ? POTASSIUM CHLORIDE SA (KLOR-CON) 20 MEQ TABLET    Take 0.5 tablets (10 mEq total) by mouth daily.  ? TORSEMIDE (DEMADEX) 20 MG TABLET    Take 0.5 tablets (10 mg total) by mouth daily.  ?Modified Medications  ? No medications on file  ?Discontinued Medications  ? CETIRIZINE (ZYRTEC) 10 MG TABLET    Take 1 tablet (10 mg total) by mouth daily.  ? ? ?Physical Exam: ? ?Vitals:  ? 01/27/22 1528  ?BP: 140/82  ?Pulse: 60  ?Temp: (!) 97.3 ?F (36.3 ?C)  ?TempSrc: Temporal  ?SpO2: 97%  ?Weight: 134 lb (60.8 kg)  ?Height: '4\' 11"'$  (1.499 m)  ? ?Body mass index is 27.06 kg/m?. ?Wt Readings from Last 3 Encounters:  ?01/27/22 134 lb (60.8 kg)  ?08/26/21 137 lb (62.1 kg)  ?04/27/21 138 lb (62.6 kg)  ? ? ?Physical Exam ?Constitutional:   ?   General: She is not in acute distress. ?   Appearance: She is well-developed. She is not diaphoretic.  ?HENT:  ?   Head: Normocephalic and atraumatic.  ?   Mouth/Throat:  ?   Pharynx: No oropharyngeal exudate.  ?Eyes:  ?   Conjunctiva/sclera: Conjunctivae normal.   ?   Pupils: Pupils are equal, round, and reactive to light.  ?Cardiovascular:  ?   Rate and Rhythm: Normal rate and regular rhythm.  ?   Heart sounds: Normal heart sounds.  ?Pulmonary:  ?   Effort: Pulmonary effort is normal.  ?   Breath sounds: Normal breath sounds.  ?Abdominal:  ?   General: Bowel sounds are normal.  ?  Palpations: Abdomen is soft.  ?Musculoskeletal:  ?   Cervical back: Normal range of motion and neck supple.  ?   Right lower leg: No edema.  ?   Left lower leg: No edema.  ?     Feet: ? ?Feet:  ?   Right foot:  ?   Toenail Condition: Right toenails are abnormally thick and long. Fungal disease present. ?   Left foot:  ?   Skin integrity: Callus (with corn) present.  ?   Toenail Condition: Left toenails are abnormally thick and long. Fungal disease present. ?Skin: ?   General: Skin is warm and dry.  ?Neurological:  ?   Mental Status: She is alert.  ?Psychiatric:     ?   Mood and Affect: Mood normal.  ? ? ?Labs reviewed: ?Basic Metabolic Panel: ?Recent Labs  ?  04/27/21 ?1534 08/26/21 ?1517  ?NA 140 138  ?K 4.0 4.1  ?CL 105 102  ?CO2 23 27  ?GLUCOSE 79 142*  ?BUN 26* 34*  ?CREATININE 1.58* 1.66*  ?CALCIUM 9.8 9.9  ? ?Liver Function Tests: ?Recent Labs  ?  04/27/21 ?1534 08/26/21 ?1517  ?AST 16 15  ?ALT 8 8  ?BILITOT 0.5 0.4  ?PROT 8.1 8.4*  ? ?No results for input(s): LIPASE, AMYLASE in the last 8760 hours. ?No results for input(s): AMMONIA in the last 8760 hours. ?CBC: ?Recent Labs  ?  04/27/21 ?1534 08/26/21 ?1517  ?WBC 5.4 5.6  ?NEUTROABS 3,521 4,116  ?HGB 9.8* 10.1*  ?HCT 31.3* 31.9*  ?MCV 86.9 87.2  ?PLT 249 276  ? ?Lipid Panel: ?No results for input(s): CHOL, HDL, LDLCALC, TRIG, CHOLHDL, LDLDIRECT in the last 8760 hours. ?TSH: ?No results for input(s): TSH in the last 8760 hours. ?A1C: ?Lab Results  ?Component Value Date  ? HGBA1C 5.5 04/14/2014  ? ? ? ?Assessment/Plan ?1. Chronic systolic heart failure ([place]) ?-euvolemic at this time. Continue current regimen. ?- torsemide (DEMADEX) 20 MG  tablet; Take 0.5 tablets (10 mg total) by mouth daily.  Dispense: 45 tablet; Refill: 3 ?- potassium chloride SA (KLOR-CON M) 20 MEQ tablet; Take 0.5 tablets (10 mEq total) by mouth daily.  Dispense: 45 tablet; Refill:

## 2022-01-28 LAB — CBC WITH DIFFERENTIAL/PLATELET
Absolute Monocytes: 448 cells/uL (ref 200–950)
Basophils Absolute: 32 cells/uL (ref 0–200)
Basophils Relative: 0.6 %
Eosinophils Absolute: 0 cells/uL — ABNORMAL LOW (ref 15–500)
Eosinophils Relative: 0 %
HCT: 35.5 % (ref 35.0–45.0)
Hemoglobin: 11.1 g/dL — ABNORMAL LOW (ref 11.7–15.5)
Lymphs Abs: 1318 cells/uL (ref 850–3900)
MCH: 27.3 pg (ref 27.0–33.0)
MCHC: 31.3 g/dL — ABNORMAL LOW (ref 32.0–36.0)
MCV: 87.4 fL (ref 80.0–100.0)
MPV: 11.2 fL (ref 7.5–12.5)
Monocytes Relative: 8.3 %
Neutro Abs: 3602 cells/uL (ref 1500–7800)
Neutrophils Relative %: 66.7 %
Platelets: 245 10*3/uL (ref 140–400)
RBC: 4.06 10*6/uL (ref 3.80–5.10)
RDW: 14.3 % (ref 11.0–15.0)
Total Lymphocyte: 24.4 %
WBC: 5.4 10*3/uL (ref 3.8–10.8)

## 2022-01-28 LAB — COMPLETE METABOLIC PANEL WITH GFR
AG Ratio: 0.9 (calc) — ABNORMAL LOW (ref 1.0–2.5)
ALT: 7 U/L (ref 6–29)
AST: 11 U/L (ref 10–35)
Albumin: 3.7 g/dL (ref 3.6–5.1)
Alkaline phosphatase (APISO): 56 U/L (ref 37–153)
BUN/Creatinine Ratio: 23 (calc) — ABNORMAL HIGH (ref 6–22)
BUN: 31 mg/dL — ABNORMAL HIGH (ref 7–25)
CO2: 27 mmol/L (ref 20–32)
Calcium: 10 mg/dL (ref 8.6–10.4)
Chloride: 104 mmol/L (ref 98–110)
Creat: 1.36 mg/dL — ABNORMAL HIGH (ref 0.60–0.95)
Globulin: 4.1 g/dL (calc) — ABNORMAL HIGH (ref 1.9–3.7)
Glucose, Bld: 89 mg/dL (ref 65–139)
Potassium: 4 mmol/L (ref 3.5–5.3)
Sodium: 141 mmol/L (ref 135–146)
Total Bilirubin: 0.3 mg/dL (ref 0.2–1.2)
Total Protein: 7.8 g/dL (ref 6.1–8.1)
eGFR: 38 mL/min/{1.73_m2} — ABNORMAL LOW (ref >=60)

## 2022-01-30 ENCOUNTER — Encounter: Payer: Medicare Other | Admitting: Occupational Therapy

## 2022-02-06 ENCOUNTER — Ambulatory Visit: Payer: Medicare Other | Attending: Family | Admitting: Occupational Therapy

## 2022-02-06 ENCOUNTER — Encounter: Payer: Medicare Other | Admitting: Occupational Therapy

## 2022-02-06 DIAGNOSIS — I890 Lymphedema, not elsewhere classified: Secondary | ICD-10-CM | POA: Insufficient documentation

## 2022-02-07 NOTE — Patient Instructions (Signed)

## 2022-02-07 NOTE — Therapy (Signed)
[place] ?[facility] ?[place], [place], [address] ?Phone: [phone]   Fax:  [phone] ? ?Occupational Therapy Treatment ? ?Patient Details  ?Name: [name] ?MRN: [id number] ?Date of Birth: 03/26/1932 ?No data recorded ? ?Encounter Date: 02/06/2022 ? ? OT End of Session - 02/06/22 1521   ? ? Visit Number 1   ? Number of Visits 72   ? Date for OT Re-Evaluation 04/09/22   ? OT Start Time 7782   ? OT Stop Time 0415   ? OT Time Calculation (min) 60 min   ? Activity Tolerance Patient tolerated treatment well;Patient limited by pain;No increased pain   ? Behavior During Therapy [facility] for tasks assessed/performed   ? ?  ?  ? ?  ? ? ?Past Medical History:  ?Diagnosis Date  ? Anemia, unspecified   ? Anxiety   ? Arthritis   ? Asthma   ? Delusions ([place])   ? Depression   ? Edema 2005  ? "since put the glass in them"   ? GERD (gastroesophageal reflux disease)   ? Lumbago   ? Lymphedema   ? Obstructive chronic bronchitis without exacerbation ([place])   ? Other abnormal glucose   ? 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 Systolic heart failure ([place])   ? EF 35 to 40% per echo January 2015  ? Unspecified essential hypertension   ? Unspecified nonpsychotic mental disorder   ? Unspecified venous (peripheral) insufficiency   ? Unspecified venous (peripheral) insufficiency   ? Unspecified vitamin D deficiency   ? ? ?Past Surgical History:  ?Procedure Laterality Date  ? APPENDECTOMY    ? CERVICAL SPINE SURGERY    ? x 2  ? CHOLECYSTECTOMY    ? ESOPHAGOGASTRODUODENOSCOPY N/A 04/05/2015  ? Procedure: ESOPHAGOGASTRODUODENOSCOPY (EGD);  Surgeon: [name], MD;  Location: [facility];  Service: Endoscopy;  Laterality: N/A;  ? FLEXIBLE SIGMOIDOSCOPY N/A 04/05/2015  ? Procedure: FLEXIBLE SIGMOIDOSCOPY;  Surgeon: [name], MD;  Location: [facility];  Service: Endoscopy;  Laterality: N/A;  ? HAND SURGERY    ? LUMBAR DISC SURGERY    ? PARTIAL HYSTERECTOMY    ? TONSILLECTOMY     ? ? ?There were no vitals filed for this visit. ? ? Subjective Assessment - 02/06/22 0813   ? ? Subjective  [name] presents for OT vist 52/100 to address BLE lymphedema. . [name] is accompanied by her nephew and caregiver , [name].  Pt reports leg pain bilaterally, but does not rate numerically.   ? Patient is accompanied by: Family member   ? Pertinent History Chronic obstructive bronchitis, CVI, anxiety , systoloc heart failure, asthma, arthritis, multiple spinal surgeries w/ chronic back pain, DM,   ? Limitations Difficulty walking, impaired standing tolerance, impaired transfers, decreased joint AROM at toes, ankles and knees, chronic leg swelling , chronic pain. increased infection and falls risks, cognitive impairment, generaized weakness   ? Repetition Increases Symptoms   ? Special Tests + Stemmer bilaterally   ? Patient Stated Goals reduce swelling and legs feel better   ? Pain Onset Other (comment)   ? ?  ?  ? ?  ? ? ? ? ? ? ? ? ? ? ? ? ? ? ? OT Treatments/Exercises (OP) - 02/07/22 4235   ? ?  ? ADLs  ? ADL Education Given Yes   ?  ? Manual Therapy  ? Manual Therapy Edema management   ? Manual Lymphatic  Drainage (MLD) MLD to LLE as established. Pants worn to clinic severly limit access to inguinal LN      for proximal MLD.   ? Compression Bandaging Rosidal foam applied in single layer circumferentially over cotton stockinet from base of toes to below knee. These positioned under CircAid reductions kits on both L and R legs.   ? Other Manual Therapy skin care to LLE with low ph castor oil concurrent w MLD   ? ?  ?  ? ?  ? ? ? ? ? ? ? ? ? OT Education - 02/07/22 0814   ? ? Education provided Yes   ? Education Details Continued Pt/ CG edu for lymphedema self care  and home program throughout session. Topics include multilayer, gradient compression wrapping, simple self-MLD, therapeutic lymphatic pumping exercises, skin/nail care, risk reduction factors and LE precautions, compression  garments/recommendations and wear and care schedule and compression garment donning / doffing using assistive devices. All questions answered to the Pt's satisfaction, and Pt demonstrates understanding by report.   ? [name] Educated Associate Professor)   ? Methods Explanation;Demonstration   ? Comprehension Verbalized understanding;Returned demonstration;Need further instruction   ? ?  ?  ? ?  ? ? ? ? ? ? OT Long Term Goals - 01/09/22 1530   ? ?  ? OT LONG TERM GOAL #1  ? Title With maximum caregiver assistance Pt will be able to apply knee length, multi-layer, short stretch compression wraps daily from ankle to tibial tuberosity on one leg at a time using correct gradient techniques to return affected limb/s, as closely as possible, to premorbid size and shape, to limit leg pain and infection risk, and to improve safe functional mobility and ADLs performance. 01/03/21: Manufacturer's rep coming next week to assist with fitting adjustable, Velcro style , knee length compression leg wraps. Hopefiullly Pt will be able to manage these more independently, and may be more do-able for caregivers. Drawback is they do not manage foot swelling well.   ? Baseline dependent   ? Time 4   ? Period Days   ? Status Revised   CG has never been comfortable applying compression wraps, but he has become quite comfortable applying adjustable, velcro wrap style compression leggings, and does so 3 or more times weekl since 06/2021.  ?  ? OT LONG TERM GOAL #2  ? Title With Maximum caregiver assistance Pt will be able to verbalize signs and symptoms of cellulitis infection and identify lymphedema precautions using printed resource (modified independence) for reference to decrease infection risk and limit LE progression over time.   ? Baseline dependent   ? Time 6   ? Period Days   ? Status Achieved   ?  ? OT LONG TERM GOAL #3  ? Title With Maximum caregiver assistance Pt >85 % compliant with all daily, LE self-care protocols for home  program, including simple self-manual lymphatic drainage (MLD), skin care, lymphatic pumping ther ex, skin care, and compression wraps and garments  to limit LE progression and further functional decline.   ? Baseline dependent   ? Time 12   ? Period Weeks   ? Status Revised   Previously: Pt lives alone and has no consistent CG assistance w LE self-care.01/09/22 : At present Pt is dramatically increasing compliance with regular and more frequent skin care and compression , at least 3 x weekly with assistance of nephew and wife.  ?  ? OT LONG TERM GOAL #4  ? Baseline dependent   ?  Time 12   ? Period Weeks   ? Status Deferred   ?  ? OT LONG TERM GOAL #5  ? Title Pt to tolerate compression wraps/ garments in keeping w/ prescribed daily wear regime within 1 week of issue date to progress and retain clinical and functional gains and to limit LE progression.   ? Baseline dependent   ? Time 12   ? Period Weeks   ? Status Achieved   Met, but non-compliant with daytime compression and wraps  ?  ? OT LONG TERM GOAL #6  ? Title Pt to achieve at least 10% LLE limb volume reduction bilaterally during modified Intensive Phase CDT to reduce infection risk, risk of recurrent leg wounds, to improve functional performance of basic and instrumental ADLs, and to limit LE progression.   ? Baseline dependent   ? Time 12   ? Period Weeks   ? Status Achieved   01/09/22: Exciting achievement this date! Pt achieved 16.9% and 17.45% volume reductions since 06/2021 in R and L legs respectively. Best reductions ever achieved since 2019 with more frequent CG assistance at home.  ? ?  ?  ? ?  ? ? ? ? ? ? ? ? Plan - 02/06/22 1521   ? ? Clinical Impression Statement Ms. Placide doing well today. Her skin condition continues to improve and swelling ois well managed between sessions with continuing family assistance 2-3 x weekly. Pt tolerated LLE MLD and skin care without increased pain. Max A to apply CircAid reduction kit  wraps over Rosidal foam         bilaterally. Continue to support Pt and family every other week through hot weather months to ensure optimal LE management and to limit infection risk.   ? OT Occupational Profile and History Comprehensive Assessmen

## 2022-02-13 ENCOUNTER — Encounter: Payer: Medicare Other | Admitting: Occupational Therapy

## 2022-02-20 ENCOUNTER — Encounter: Payer: Medicare Other | Admitting: Occupational Therapy

## 2022-02-20 ENCOUNTER — Ambulatory Visit: Payer: Medicare Other | Admitting: Occupational Therapy

## 2022-02-20 DIAGNOSIS — I890 Lymphedema, not elsewhere classified: Secondary | ICD-10-CM | POA: Diagnosis not present

## 2022-02-22 NOTE — Therapy (Signed)
[place] ?[facility] ?[place], [place], [address] ?Phone: [phone]   Fax:  [phone] ? ?Occupational Therapy Treatment ? ?Patient Details  ?Name: [name] ?MRN: [id number] ?Date of Birth: 26-Nov-1931 ?No data recorded ? ?Encounter Date: 02/20/2022 ? ? OT End of Session - 02/22/22 1147   ? ? Visit Number 109   ? Number of Visits 72   ? Date for OT Re-Evaluation 04/09/22   ? OT Start Time 0310   ? OT Stop Time 0410   ? OT Time Calculation (min) 60 min   ? Activity Tolerance Patient tolerated treatment well;Patient limited by pain;No increased pain   ? Behavior During Therapy [name] for tasks assessed/performed   ? ?  ?  ? ?  ? ? ?Past Medical History:  ?Diagnosis Date  ? Anemia, unspecified   ? Anxiety   ? Arthritis   ? Asthma   ? Delusions ([place])   ? Depression   ? Edema 2005  ? "since put the glass in them"   ? GERD (gastroesophageal reflux disease)   ? Lumbago   ? Lymphedema   ? Obstructive chronic bronchitis without exacerbation ([place])   ? Other abnormal glucose   ? 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 Systolic heart failure ([place])   ? EF 35 to 40% per echo January 2015  ? Unspecified essential hypertension   ? Unspecified nonpsychotic mental disorder   ? Unspecified venous (peripheral) insufficiency   ? Unspecified venous (peripheral) insufficiency   ? Unspecified vitamin D deficiency   ? ? ?Past Surgical History:  ?Procedure Laterality Date  ? APPENDECTOMY    ? CERVICAL SPINE SURGERY    ? x 2  ? CHOLECYSTECTOMY    ? ESOPHAGOGASTRODUODENOSCOPY N/A 04/05/2015  ? Procedure: ESOPHAGOGASTRODUODENOSCOPY (EGD);  Surgeon: [name], MD;  Location: [facility];  Service: Endoscopy;  Laterality: N/A;  ? FLEXIBLE SIGMOIDOSCOPY N/A 04/05/2015  ? Procedure: FLEXIBLE SIGMOIDOSCOPY;  Surgeon: [name], MD;  Location: [facility];  Service: Endoscopy;  Laterality: N/A;  ? HAND SURGERY    ? LUMBAR DISC SURGERY    ? PARTIAL HYSTERECTOMY    ? TONSILLECTOMY     ? ? ?There were no vitals filed for this visit. ? ? Subjective Assessment - 02/22/22 1147   ? ? Subjective  Pt [name] presents for OT vist 53/100 to address BLE lymphedema. . [name] is accompanied by her nephew and caregiver , [name].  [name] states, "I just hope we can maintain what we're doing." Pt reports leg pain below the knees 5/10.   ? Patient is accompanied by: Family member   ? Pertinent History Chronic obstructive bronchitis, CVI, anxiety , systoloc heart failure, asthma, arthritis, multiple spinal surgeries w/ chronic back pain, DM,   ? Limitations Difficulty walking, impaired standing tolerance, impaired transfers, decreased joint AROM at toes, ankles and knees, chronic leg swelling , chronic pain. increased infection and falls risks, cognitive impairment, generaized weakness   ? Repetition Increases Symptoms   ? Special Tests + Stemmer bilaterally   ? Patient Stated Goals reduce swelling and legs feel better   ? Pain Onset Other (comment)   ? ?  ?  ? ?  ? ? ? ? ? ? ? ? ? ? ? ? ? ? ? ? ? ? ? ? ? ? ? OT Education - 02/22/22 1154   ? ? Education provided Yes   ? Education Details Continued Pt/ CG  edu for lymphedema self care  and home program throughout session. Topics include multilayer, gradient compression wrapping, simple self-MLD, therapeutic lymphatic pumping exercises, skin/nail care, risk reduction factors and LE precautions, compression garments/recommendations and wear and care schedule and compression garment donning / doffing using assistive devices. All questions answered to the Pt's satisfaction, and Pt demonstrates understanding by report.   ? Person(s) Educated Associate Professor)   ? Methods Explanation;Demonstration   ? Comprehension Verbalized understanding;Returned demonstration;Need further instruction   ? ?  ?  ? ?  ? ? ? ? ? ? OT Long Term Goals - 01/09/22 1530   ? ?  ? OT LONG TERM GOAL #1  ? Title With maximum caregiver assistance Pt will be able to apply knee length,  multi-layer, short stretch compression wraps daily from ankle to tibial tuberosity on one leg at a time using correct gradient techniques to return affected limb/s, as closely as possible, to premorbid size and shape, to limit leg pain and infection risk, and to improve safe functional mobility and ADLs performance. 01/03/21: Manufacturer's rep coming next week to assist with fitting adjustable, Velcro style , knee length compression leg wraps. Hopefiullly Pt will be able to manage these more independently, and may be more do-able for caregivers. Drawback is they do not manage foot swelling well.   ? Baseline dependent   ? Time 4   ? Period Days   ? Status Revised   CG has never been comfortable applying compression wraps, but he has become quite comfortable applying adjustable, velcro wrap style compression leggings, and does so 3 or more times weekl since 06/2021.  ?  ? OT LONG TERM GOAL #2  ? Title With Maximum caregiver assistance Pt will be able to verbalize signs and symptoms of cellulitis infection and identify lymphedema precautions using printed resource (modified independence) for reference to decrease infection risk and limit LE progression over time.   ? Baseline dependent   ? Time 6   ? Period Days   ? Status Achieved   ?  ? OT LONG TERM GOAL #3  ? Title With Maximum caregiver assistance Pt >85 % compliant with all daily, LE self-care protocols for home program, including simple self-manual lymphatic drainage (MLD), skin care, lymphatic pumping ther ex, skin care, and compression wraps and garments  to limit LE progression and further functional decline.   ? Baseline dependent   ? Time 12   ? Period Weeks   ? Status Revised   Previously: Pt lives alone and has no consistent CG assistance w LE self-care.01/09/22 : At present Pt is dramatically increasing compliance with regular and more frequent skin care and compression , at least 3 x weekly with assistance of nephew and wife.  ?  ? OT LONG TERM GOAL #4  ?  Baseline dependent   ? Time 12   ? Period Weeks   ? Status Deferred   ?  ? OT LONG TERM GOAL #5  ? Title Pt to tolerate compression wraps/ garments in keeping w/ prescribed daily wear regime within 1 week of issue date to progress and retain clinical and functional gains and to limit LE progression.   ? Baseline dependent   ? Time 12   ? Period Weeks   ? Status Achieved   Met, but non-compliant with daytime compression and wraps  ?  ? OT LONG TERM GOAL #6  ? Title Pt to achieve at least 10% LLE limb volume reduction bilaterally during modified Intensive Phase  CDT to reduce infection risk, risk of recurrent leg wounds, to improve functional performance of basic and instrumental ADLs, and to limit LE progression.   ? Baseline dependent   ? Time 12   ? Period Weeks   ? Status Achieved   01/09/22: Exciting achievement this date! Pt achieved 16.9% and 17.45% volume reductions since 06/2021 in R and L legs respectively. Best reductions ever achieved since 2019 with more frequent CG assistance at home.  ? ?  ?  ? ?  ? ? ? ? ? ? ? ? Plan - 02/22/22 1150   ? ? Clinical Impression Statement Ms. Strehle doing well  again today. Her skin condition continues to improve each week with diligent assistance from family caregivers and swelling is well managed between sessions. Large, deep fllakes of skin has resolved and there are no open areas on the skin. Pt is no longer breaking the skin when scratching her legs and inflammation appears to have reduced dramatically.  Pt tolerated LLE MLD and skin care without increased pain. Max A to apply CircAid reduction kit  wraps over Rosidal foam        bilaterally. Continue to support Pt and family every other week through hot weather months to ensure optimal LE management and to limit infection risk.   ? OT Occupational Profile and History Comprehensive Assessment- Review of records and extensive additional review of physical, cognitive, psychosocial history related to current functional  performance   ? Occupational Profile and client history currently impacting functional performance dimensia, inconsistent CG assistance with removing wraps, providing skin care and reapplying compression betwe

## 2022-02-22 NOTE — Patient Instructions (Signed)

## 2022-02-27 ENCOUNTER — Encounter: Payer: Medicare Other | Admitting: Occupational Therapy

## 2022-03-13 ENCOUNTER — Ambulatory Visit: Payer: Medicare Other | Attending: Family | Admitting: Occupational Therapy

## 2022-03-13 DIAGNOSIS — I890 Lymphedema, not elsewhere classified: Secondary | ICD-10-CM | POA: Insufficient documentation

## 2022-03-13 NOTE — Therapy (Signed)
[facility] ?[place], [place], [address] ?Phone: [phone]   Fax:  [phone] ? ?Occupational Therapy Treatment ? ?Patient Details  ?Name: [name] ?MRN: [id number] ?Date of Birth: 1932-02-13 ?No data recorded ? ?Encounter Date: 03/13/2022 ? ? OT End of Session - 03/13/22 1505   ? ? Visit Number 40   ? Number of Visits 72   ? Date for OT Re-Evaluation 04/09/22   ? OT Start Time 0300   ? OT Stop Time 0410   ? OT Time Calculation (min) 70 min   ? Activity Tolerance Patient tolerated treatment well;Patient limited by pain;No increased pain   ? Behavior During Therapy [facility] for tasks assessed/performed   ? ?  ?  ? ?  ? ? ?Past Medical History:  ?Diagnosis Date  ? Anemia, unspecified   ? Anxiety   ? Arthritis   ? Asthma   ? Delusions ([place])   ? Depression   ? Edema 2005  ? "since put the glass in them"   ? GERD (gastroesophageal reflux disease)   ? Lumbago   ? Lymphedema   ? Obstructive chronic bronchitis without exacerbation ([place])   ? Other abnormal glucose   ? 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 Systolic heart failure ([place])   ? EF 35 to 40% per echo January 2015  ? Unspecified essential hypertension   ? Unspecified nonpsychotic mental disorder   ? Unspecified venous (peripheral) insufficiency   ? Unspecified venous (peripheral) insufficiency   ? Unspecified vitamin D deficiency   ? ? ?Past Surgical History:  ?Procedure Laterality Date  ? APPENDECTOMY    ? CERVICAL SPINE SURGERY    ? x 2  ? CHOLECYSTECTOMY    ? ESOPHAGOGASTRODUODENOSCOPY N/A 04/05/2015  ? Procedure: ESOPHAGOGASTRODUODENOSCOPY (EGD);  Surgeon: [name], MD;  Location: [facility];  Service: Endoscopy;  Laterality: N/A;  ? FLEXIBLE SIGMOIDOSCOPY N/A 04/05/2015  ? Procedure: FLEXIBLE SIGMOIDOSCOPY;  Surgeon: [name], MD;  Location: [facility];  Service: Endoscopy;  Laterality: N/A;  ? HAND SURGERY    ? LUMBAR DISC SURGERY    ? PARTIAL HYSTERECTOMY    ? TONSILLECTOMY     ? ? ?There were no vitals filed for this visit. ? ? Subjective Assessment - 03/13/22 1613   ? ? Subjective  Pt Ms. Kotter presents for OT to address BLE lymphedema. . Ms. Mcclees is accompanied by her nephew and caregiver , [name].  Pt endorses some LE-related leg pain but does not rate pain numerically. [name] reports they are now performing skin care and applying compression 4 x weekly between sessions.   ? Patient is accompanied by: Family member   ? Pertinent History Chronic obstructive bronchitis, CVI, anxiety , systoloc heart failure, asthma, arthritis, multiple spinal surgeries w/ chronic back pain, DM,   ? Limitations Difficulty walking, impaired standing tolerance, impaired transfers, decreased joint AROM at toes, ankles and knees, chronic leg swelling , chronic pain. increased infection and falls risks, cognitive impairment, generaized weakness   ? Repetition Increases Symptoms   ? Special Tests + Stemmer bilaterally   ? Patient Stated Goals reduce swelling and legs feel better   ? Pain Onset Other (comment)   ? ?  ?  ? ?  ? ? ? ? ? ? ? ? ? ? ? ? ? ? ? OT Treatments/Exercises (OP) - 03/13/22 1615   ? ?  ? ADLs  ? ADL Education Given Yes   ?  ?  Manual Therapy  ? Manual Therapy Edema management   ? Manual Lymphatic Drainage (MLD) MLD to LLE as established. Pants worn to clinic severly limit access to inguinal LN      for proximal MLD.   ? Compression Bandaging Rosidal foam applied in single layer circumferentially over cotton stockinet from base of toes to below knee. These positioned under CircAid reductions kits on both L and R legs.   ? Other Manual Therapy skin care to LLE with low ph castor oil concurrent w MLD   ? ?  ?  ? ?  ? ? ? ? ? ? ? ? ? OT Education - 03/13/22 1615   ? ? Education provided Yes   ? Education Details Continued Pt/ CG edu for lymphedema self care  and home program throughout session. Topics include multilayer, gradient compression wrapping, simple self-MLD, therapeutic lymphatic  pumping exercises, skin/nail care, risk reduction factors and LE precautions, compression garments/recommendations and wear and care schedule and compression garment donning / doffing using assistive devices. All questions answered to the Pt's satisfaction, and Pt demonstrates understanding by report.   ? Person(s) Educated Associate Professor)   ? Methods Explanation;Demonstration   ? Comprehension Verbalized understanding;Returned demonstration;Need further instruction   ? ?  ?  ? ?  ? ? ? ? ? ? OT Long Term Goals - 01/09/22 1530   ? ?  ? OT LONG TERM GOAL #1  ? Title With maximum caregiver assistance Pt will be able to apply knee length, multi-layer, short stretch compression wraps daily from ankle to tibial tuberosity on one leg at a time using correct gradient techniques to return affected limb/s, as closely as possible, to premorbid size and shape, to limit leg pain and infection risk, and to improve safe functional mobility and ADLs performance. 01/03/21: Manufacturer's rep coming next week to assist with fitting adjustable, Velcro style , knee length compression leg wraps. Hopefiullly Pt will be able to manage these more independently, and may be more do-able for caregivers. Drawback is they do not manage foot swelling well.   ? Baseline dependent   ? Time 4   ? Period Days   ? Status Revised   CG has never been comfortable applying compression wraps, but he has become quite comfortable applying adjustable, velcro wrap style compression leggings, and does so 3 or more times weekl since 06/2021.  ?  ? OT LONG TERM GOAL #2  ? Title With Maximum caregiver assistance Pt will be able to verbalize signs and symptoms of cellulitis infection and identify lymphedema precautions using printed resource (modified independence) for reference to decrease infection risk and limit LE progression over time.   ? Baseline dependent   ? Time 6   ? Period Days   ? Status Achieved   ?  ? OT LONG TERM GOAL #3  ? Title With Maximum  caregiver assistance Pt >85 % compliant with all daily, LE self-care protocols for home program, including simple self-manual lymphatic drainage (MLD), skin care, lymphatic pumping ther ex, skin care, and compression wraps and garments  to limit LE progression and further functional decline.   ? Baseline dependent   ? Time 12   ? Period Weeks   ? Status Revised   Previously: Pt lives alone and has no consistent CG assistance w LE self-care.01/09/22 : At present Pt is dramatically increasing compliance with regular and more frequent skin care and compression , at least 3 x weekly with assistance of nephew and wife.  ?  ?  OT LONG TERM GOAL #4  ? Baseline dependent   ? Time 12   ? Period Weeks   ? Status Deferred   ?  ? OT LONG TERM GOAL #5  ? Title Pt to tolerate compression wraps/ garments in keeping w/ prescribed daily wear regime within 1 week of issue date to progress and retain clinical and functional gains and to limit LE progression.   ? Baseline dependent   ? Time 12   ? Period Weeks   ? Status Achieved   Met, but non-compliant with daytime compression and wraps  ?  ? OT LONG TERM GOAL #6  ? Title Pt to achieve at least 10% LLE limb volume reduction bilaterally during modified Intensive Phase CDT to reduce infection risk, risk of recurrent leg wounds, to improve functional performance of basic and instrumental ADLs, and to limit LE progression.   ? Baseline dependent   ? Time 12   ? Period Weeks   ? Status Achieved   01/09/22: Exciting achievement this date! Pt achieved 16.9% and 17.45% volume reductions since 06/2021 in R and L legs respectively. Best reductions ever achieved since 2019 with more frequent CG assistance at home.  ? ?  ?  ? ?  ? ? ? ? ? ? ? ? Plan - 03/13/22 1612   ? ? Clinical Impression Statement Lymphedema is well managed bilaterally. Skin hydration is improved and swelling is decreased overall. CG reprts they are bathing land applying low ph lotion to legs 4 x weekly and usinmg compression at  same frequency.  Pt tolerated MLD and simultaneous skin care to LLR. Appliefd Rosidal foam layer, then CircAid     wraps over. Pt able to fit recently purchased street shoes. Cont OT every other week  and PR

## 2022-03-13 NOTE — Patient Instructions (Signed)

## 2022-03-15 ENCOUNTER — Other Ambulatory Visit: Payer: Self-pay | Admitting: Nurse Practitioner

## 2022-03-15 DIAGNOSIS — D509 Iron deficiency anemia, unspecified: Secondary | ICD-10-CM

## 2022-03-29 ENCOUNTER — Encounter: Payer: Medicare Other | Admitting: Occupational Therapy

## 2022-04-03 ENCOUNTER — Ambulatory Visit: Payer: Medicare Other | Attending: Family | Admitting: Occupational Therapy

## 2022-04-03 DIAGNOSIS — I890 Lymphedema, not elsewhere classified: Secondary | ICD-10-CM | POA: Diagnosis not present

## 2022-04-03 NOTE — Patient Instructions (Signed)

## 2022-04-07 NOTE — Therapy (Signed)
[place] [facility] [address] [place], [place], [address] Phone: [phone]   Fax:  [phone]  Occupational Therapy Treatment  Patient Details  Name: [name] MRN: [id number] Date of Birth: February 15, 1932 No data recorded  Encounter Date: 04/03/2022   OT End of Session - 04/07/22 0907     Visit Number 78    Number of Visits 42    Date for OT Re-Evaluation 04/09/22    OT Start Time 0315    OT Stop Time 0415    OT Time Calculation (min) 60 min    Activity Tolerance Patient tolerated treatment well;Patient limited by pain;No increased pain    Behavior During Therapy WFL for tasks assessed/performed             Past Medical History:  Diagnosis Date   Anemia, unspecified    Anxiety    Arthritis    Asthma    Delusions ([place])    Depression    Edema 2005   "since put the glass in them"    GERD (gastroesophageal reflux disease)    Lumbago    Lymphedema    Obstructive chronic bronchitis without exacerbation ([place])    Other abnormal glucose    Psychotic paranoia ([place]) 9/0/3833   Systolic heart failure ([place])    EF 35 to 40% per echo January 2015   Unspecified essential hypertension    Unspecified nonpsychotic mental disorder    Unspecified venous (peripheral) insufficiency    Unspecified venous (peripheral) insufficiency    Unspecified vitamin D deficiency     Past Surgical History:  Procedure Laterality Date   APPENDECTOMY     CERVICAL SPINE SURGERY     x 2   CHOLECYSTECTOMY     ESOPHAGOGASTRODUODENOSCOPY N/A 04/05/2015   Procedure: ESOPHAGOGASTRODUODENOSCOPY (EGD);  Surgeon: [name], MD;  Location: [facility];  Service: Endoscopy;  Laterality: N/A;   FLEXIBLE SIGMOIDOSCOPY N/A 04/05/2015   Procedure: FLEXIBLE SIGMOIDOSCOPY;  Surgeon: [name], MD;  Location: [facility];  Service: Endoscopy;  Laterality: N/A;   HAND SURGERY     LUMBAR DISC SURGERY     PARTIAL HYSTERECTOMY     TONSILLECTOMY       There were no vitals filed for this visit.   Subjective Assessment - 04/07/22 0905     Subjective  Pt Ms. Kelliher presents for OT to address BLE lymphedema. Ms. Weimer is accompanied by her nephew and caregiver , [name].  Pt reports 6/10 lymphedema-related pain in her legs today. [name] reports they  continue to perform skin care and compression 4 x weekly between sessions.    Patient is accompanied by: Family member    Pertinent History Chronic obstructive bronchitis, CVI, anxiety , systoloc heart failure, asthma, arthritis, multiple spinal surgeries w/ chronic back pain, DM,    Limitations Difficulty walking, impaired standing tolerance, impaired transfers, decreased joint AROM at toes, ankles and knees, chronic leg swelling , chronic pain. increased infection and falls risks, cognitive impairment, generaized weakness    Repetition Increases Symptoms    Special Tests + Stemmer bilaterally    Patient Stated Goals reduce swelling and legs feel better    Pain Onset Other (comment)                          OT Treatments/Exercises (OP) - 04/07/22 3832       ADLs   ADL Education Given Yes      Manual  Therapy   Manual Therapy Edema management    Manual Lymphatic Drainage (MLD) MLD to LLE as established. Pants worn to clinic severly limit access to inguinal LN      for proximal MLD.    Compression Bandaging Rosidal foam applied in single layer circumferentially over cotton stockinet from base of toes to below knee. These positioned under CircAid reductions kits on both L and R legs.    Other Manual Therapy skin care to LLE with low ph castor oil concurrent w MLD                    OT Education - 04/07/22 0906     Education provided Yes    Education Details Continued Pt/ CG edu for lymphedema self care  and home program throughout session. Topics include multilayer, gradient compression wrapping, simple self-MLD, therapeutic lymphatic pumping exercises, skin/nail  care, risk reduction factors and LE precautions, compression garments/recommendations and wear and care schedule and compression garment donning / doffing using assistive devices. All questions answered to the Pt's satisfaction, and Pt demonstrates understanding by report.    Person(s) Educated Patient;Caregiver(s)    Methods Explanation;Demonstration    Comprehension Verbalized understanding;Returned demonstration;Need further instruction                 OT Long Term Goals - 01/09/22 1530       OT LONG TERM GOAL #1   Title With maximum caregiver assistance Pt will be able to apply knee length, multi-layer, short stretch compression wraps daily from ankle to tibial tuberosity on one leg at a time using correct gradient techniques to return affected limb/s, as closely as possible, to premorbid size and shape, to limit leg pain and infection risk, and to improve safe functional mobility and ADLs performance. 01/03/21: Manufacturer's rep coming next week to assist with fitting adjustable, Velcro style , knee length compression leg wraps. Hopefi[place]ly Pt will be able to manage these more independently, and may be more do-able for caregivers. Drawback is they do not manage foot swelling well.    Baseline dependent    Time 4    Period Days    Status Revised   CG has never been comfortable applying compression wraps, but he has become quite comfortable applying adjustable, velcro wrap style compression leggings, and does so 3 or more times weekl since 06/2021.     OT LONG TERM GOAL #2   Title With Maximum caregiver assistance Pt will be able to verbalize signs and symptoms of cellulitis infection and identify lymphedema precautions using printed resource (modified independence) for reference to decrease infection risk and limit LE progression over time.    Baseline dependent    Time 6    Period Days    Status Achieved      OT LONG TERM GOAL #3   Title With Maximum caregiver assistance Pt >85 %  compliant with all daily, LE self-care protocols for home program, including simple self-manual lymphatic drainage (MLD), skin care, lymphatic pumping ther ex, skin care, and compression wraps and garments  to limit LE progression and further functional decline.    Baseline dependent    Time 12    Period Weeks    Status Revised   Previously: Pt lives alone and has no consistent CG assistance w LE self-care.01/09/22 : At present Pt is dramatically increasing compliance with regular and more frequent skin care and compression , at least 3 x weekly with assistance of nephew and wife.  OT LONG TERM GOAL #4   Baseline dependent    Time 12    Period Weeks    Status Deferred      OT LONG TERM GOAL #5   Title Pt to tolerate compression wraps/ garments in keeping w/ prescribed daily wear regime within 1 week of issue date to progress and retain clinical and functional gains and to limit LE progression.    Baseline dependent    Time 12    Period Weeks    Status Achieved   Met, but non-compliant with daytime compression and wraps     OT LONG TERM GOAL #6   Title Pt to achieve at least 10% LLE limb volume reduction bilaterally during modified Intensive Phase CDT to reduce infection risk, risk of recurrent leg wounds, to improve functional performance of basic and instrumental ADLs, and to limit LE progression.    Baseline dependent    Time 12    Period Weeks    Status Achieved   01/09/22: Exciting achievement this date! Pt achieved 16.9% and 17.45% volume reductions since 06/2021 in R and L legs respectively. Best reductions ever achieved since 2019 with more frequent CG assistance at home.                  Plan - 04/07/22 0903     Clinical Impression Statement Ms. Domingos doing well  again today. Her condition continues to improve each week with diligent 4 x weekly assistance with skin care and compression from family and paid caregivers between sessions. Large, deep fllakes of skin have  resolved and there are no open areas on the skin. Pt is no longer breaking the skin when scratching her legs and inflammation appears to have reduced dramatically.  Pt tolerated LLE MLD and skin care without increased pain. Max A to apply CircAid reduction kit  wraps over Rosidal foam        bilaterally. Continue to support Pt and family every other week through hot weather months to ensure optimal LE management and to limit infection risk.    OT Occupational Profile and History Comprehensive Assessment- Review of records and extensive additional review of physical, cognitive, psychosocial history related to current functional performance    Occupational Profile and client history currently impacting functional performance dimensia, inconsistent CG assistance with removing wraps, providing skin care and reapplying compression between clinical visits. S    Occupational performance deficits (Please refer to evaluation for details): ADL's;IADL's;Rest and Sleep;Play;Leisure;Social Participation;Other   body image   Body Structure / Function / Physical Skills ADL;Decreased knowledge of use of DME;Pain;Edema;Scar mobility;Decreased knowledge of precautions;Skin integrity    Rehab Potential Fair    Clinical Decision Making Multiple treatment options, significant modification of task necessary    Comorbidities Affecting Occupational Performance: Presence of comorbidities impacting occupational performance    Modification or Assistance to Complete Evaluation  Max significant modification of tasks or assist is necessary to complete    OT Frequency Other (comment)   every other week   OT Duration 12 weeks   and PRN ongoing to ensure optimal LE management and to support caregiver. Without ongoing skilled OT condition will worsen and further functional decline is expected   OT Treatment/Interventions Self-care/ADL training;DME and/or AE instruction;Manual lymph drainage;Patient/family education;Compression  bandaging;Therapeutic exercise;Manual Therapy;Other (comment)    Plan skin care to decrease infection risk and improve hydration    Recommended Other Services fit with adjustable , alternative compression wraps for optimal independence w/ donning and doffing. Cnsider  CircAid with footpieces.    Consulted and Agree with Plan of Care Patient;Family member/caregiver             Patient will benefit from skilled therapeutic intervention in order to improve the following deficits and impairments:   Body Structure / Function / Physical Skills: ADL, Decreased knowledge of use of DME, Pain, Edema, Scar mobility, Decreased knowledge of precautions, Skin integrity       Visit Diagnosis: Lymphedema, not elsewhere classified    Problem List Patient Active Problem List   Diagnosis Date Noted   PVD (peripheral vascular disease) ([place]) 04/27/2021   Medication noncompliance due to cognitive impairment 10/17/2019   Chronic pain of multiple joints 08/01/2017   Age-related osteoporosis without current pathological fracture 08/01/2017   Left knee pain 09/19/2016   Rectal benign neoplasm    Asthma, chronic 04/04/2015   history of Tubular adenoma of colon 04/04/2015   CKD (chronic kidney disease) stage 3, GFR 30-59 ml/min ([facility]) 04/03/2015   Edema 59/45/8592   Chronic systolic heart failure ([place]) 11/20/2013   Lymphedema of lower extremity 10/17/2013   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   MGUS (monoclonal gammopathy of unknown significance) 05/15/2012   Vitamin D deficiency 01/29/2010   DERMATITIS 09/30/2009   Paranoid delusion ([place]) 08/02/2008   Obstructive chronic bronchitis ([place]) 08/02/2008   Essential hypertension 11/11/2007   Venous (peripheral) insufficiency 11/11/2007   RENAL CALCULUS 11/11/2007   LOW BACK PAIN, CHRONIC 11/11/2007   Andrey Spearman, MS, OTR/L, CLT-LANA 04/07/22 9:09 AM   [facility] MAIN [facility] SERVICES [place], [place], [address] Phone: [phone]   Fax:  [phone]  Name: HAYLI MILLIGAN MRN: [id number] Date of Birth: 01-24-1932

## 2022-04-17 ENCOUNTER — Ambulatory Visit: Payer: Medicare Other | Admitting: Occupational Therapy

## 2022-04-19 ENCOUNTER — Other Ambulatory Visit: Payer: Self-pay | Admitting: Nurse Practitioner

## 2022-04-19 DIAGNOSIS — I890 Lymphedema, not elsewhere classified: Secondary | ICD-10-CM

## 2022-04-19 DIAGNOSIS — I5022 Chronic systolic (congestive) heart failure: Secondary | ICD-10-CM

## 2022-04-27 ENCOUNTER — Ambulatory Visit: Payer: Medicare Other

## 2022-05-03 ENCOUNTER — Ambulatory Visit: Payer: Medicare Other

## 2022-05-08 ENCOUNTER — Ambulatory Visit: Payer: Medicare Other | Attending: Family | Admitting: Occupational Therapy

## 2022-05-08 DIAGNOSIS — I890 Lymphedema, not elsewhere classified: Secondary | ICD-10-CM | POA: Diagnosis not present

## 2022-05-10 ENCOUNTER — Other Ambulatory Visit: Payer: Self-pay | Admitting: Nurse Practitioner

## 2022-05-10 ENCOUNTER — Encounter: Payer: Self-pay | Admitting: Occupational Therapy

## 2022-05-10 DIAGNOSIS — J449 Chronic obstructive pulmonary disease, unspecified: Secondary | ICD-10-CM

## 2022-05-10 DIAGNOSIS — J4489 Other specified chronic obstructive pulmonary disease: Secondary | ICD-10-CM

## 2022-05-10 NOTE — Patient Instructions (Signed)

## 2022-05-10 NOTE — Addendum Note (Signed)
Addended by: Ansel Bong on: 05/10/2022 02:41 PM   Modules accepted: Orders

## 2022-05-10 NOTE — Therapy (Signed)
[place] MAIN [facility] SERVICES [address] [place], [place], [address] Phone: [phone]   Fax:  [phone]  Occupational Therapy Treatment  Patient Details  Name: [name] MRN: [id number] Date of Birth: Sep 01, 1932 No data recorded  Encounter Date: 05/08/2022   OT End of Session - 05/10/22 1437     Visit Number 73    Number of Visits 72    Date for OT Re-Evaluation 08/06/22    OT Start Time 0310    OT Stop Time 0415    OT Time Calculation (min) 65 min    Activity Tolerance Patient tolerated treatment well;Patient limited by pain;No increased pain    Behavior During Therapy WFL for tasks assessed/performed             Past Medical History:  Diagnosis Date   Anemia, unspecified    Anxiety    Arthritis    Asthma    Delusions ([place])    Depression    Edema 2005   "since put the glass in them"    GERD (gastroesophageal reflux disease)    Lumbago    Lymphedema    Obstructive chronic bronchitis without exacerbation ([place])    Other abnormal glucose    Psychotic paranoia ([place]) 12/03/2681   Systolic heart failure ([place])    EF 35 to 40% per echo January 2015   Unspecified essential hypertension    Unspecified nonpsychotic mental disorder    Unspecified venous (peripheral) insufficiency    Unspecified venous (peripheral) insufficiency    Unspecified vitamin D deficiency     Past Surgical History:  Procedure Laterality Date   APPENDECTOMY     CERVICAL SPINE SURGERY     x 2   CHOLECYSTECTOMY     ESOPHAGOGASTRODUODENOSCOPY N/A 04/05/2015   Procedure: ESOPHAGOGASTRODUODENOSCOPY (EGD);  Surgeon: [name], MD;  Location: [facility];  Service: Endoscopy;  Laterality: N/A;   FLEXIBLE SIGMOIDOSCOPY N/A 04/05/2015   Procedure: FLEXIBLE SIGMOIDOSCOPY;  Surgeon: [name], MD;  Location: [facility];  Service: Endoscopy;  Laterality: N/A;   HAND SURGERY     LUMBAR DISC SURGERY     PARTIAL HYSTERECTOMY     TONSILLECTOMY       There were no vitals filed for this visit.   Subjective Assessment - 05/10/22 1426     Subjective  Pt [name] presents for OT to address BLE lymphedema. She was last seen on 6/9. [name] is accompanied by her nephew and caregiver , [name].  Pt reports 5/10 lymphedema-related pain in her legs today. Discussed w team how well Pt is managing with LE  caregiver assistance at home over longer visit interval. Pt denies new complaints today.    Patient is accompanied by: Family member    Pertinent History Chronic obstructive bronchitis, CVI, anxiety , systoloc heart failure, asthma, arthritis, multiple spinal surgeries w/ chronic back pain, DM,    Limitations Difficulty walking, impaired standing tolerance, impaired transfers, decreased joint AROM at toes, ankles and knees, chronic leg swelling , chronic pain. increased infection and falls risks, cognitive impairment, generaized weakness    Repetition Increases Symptoms    Special Tests + [place] bilaterally    Patient Stated Goals reduce swelling and legs feel better    Currently in Pain? Yes    Pain Score 5     Pain Location Leg    Pain Orientation Right;Left    Pain Descriptors / Indicators Aching;Tender;Tightness;Tiring;Sore;Heaviness;Discomfort    Pain Type Chronic pain  Pain Onset Other (comment)    Pain Frequency Constant    Multiple Pain Sites Yes    Pain Location Back    Pain Descriptors / Indicators Aching;Other (Comment)   stiff   Pain Type Chronic pain    Pain Frequency Constant                          OT Treatments/Exercises (OP) - 05/10/22 1430       ADLs   ADL Education Given Yes      Manual Therapy   Manual Therapy Edema management    Manual Lymphatic Drainage (MLD) MLD to LLE as established. Pants worn to clinic severly limit access to inguinal LN      for proximal MLD.    Compression Bandaging Rosidal foam applied in single layer circumferentially over cotton stockinet from base of toes to  below knee. These positioned under CircAid reductions kits on both L and R legs.    Other Manual Therapy skin care to LLE with low ph castor oil concurrent w MLD                    OT Education - 05/10/22 1430     Education provided Yes    Education Details Continued Pt/ CG edu for lymphedema self care  and home program throughout session. Topics include multilayer, gradient compression wrapping, simple self-MLD, therapeutic lymphatic pumping exercises, skin/nail care, risk reduction factors and LE precautions, compression garments/recommendations and wear and care schedule and compression garment donning / doffing using assistive devices. All questions answered to the Pt's satisfaction, and Pt demonstrates understanding by report.    Person(s) Educated Patient;Caregiver(s)    Methods Explanation;Demonstration    Comprehension Verbalized understanding;Returned demonstration;Need further instruction                 OT Long Term Goals - 01/09/22 1530       OT LONG TERM GOAL #1   Title With maximum caregiver assistance Pt will be able to apply knee length, multi-layer, short stretch compression wraps daily from ankle to tibial tuberosity on one leg at a time using correct gradient techniques to return affected limb/s, as closely as possible, to premorbid size and shape, to limit leg pain and infection risk, and to improve safe functional mobility and ADLs performance. 01/03/21: Manufacturer's rep coming next week to assist with fitting adjustable, Velcro style , knee length compression leg wraps. Hopefiullly Pt will be able to manage these more independently, and may be more do-able for caregivers. Drawback is they do not manage foot swelling well.    Baseline dependent    Time 4    Period Days    Status Revised   CG has never been comfortable applying compression wraps, but he has become quite comfortable applying adjustable, velcro wrap style compression leggings, and does so 3  or more times weekl since 06/2021.     OT LONG TERM GOAL #2   Title With Maximum caregiver assistance Pt will be able to verbalize signs and symptoms of cellulitis infection and identify lymphedema precautions using printed resource (modified independence) for reference to decrease infection risk and limit LE progression over time.    Baseline dependent    Time 6    Period Days    Status Achieved      OT LONG TERM GOAL #3   Title With Maximum caregiver assistance Pt >85 % compliant with all daily, LE self-care protocols  for home program, including simple self-manual lymphatic drainage (MLD), skin care, lymphatic pumping ther ex, skin care, and compression wraps and garments  to limit LE progression and further functional decline.    Baseline dependent    Time 12    Period Weeks    Status Revised   Previously: Pt lives alone and has no consistent CG assistance w LE self-care.01/09/22 : At present Pt is dramatically increasing compliance with regular and more frequent skin care and compression , at least 3 x weekly with assistance of nephew and wife.     OT LONG TERM GOAL #4   Baseline dependent    Time 12    Period Weeks    Status Deferred      OT LONG TERM GOAL #5   Title Pt to tolerate compression wraps/ garments in keeping w/ prescribed daily wear regime within 1 week of issue date to progress and retain clinical and functional gains and to limit LE progression.    Baseline dependent    Time 12    Period Weeks    Status Achieved   Met, but non-compliant with daytime compression and wraps     OT LONG TERM GOAL #6   Title Pt to achieve at least 10% LLE limb volume reduction bilaterally during modified Intensive Phase CDT to reduce infection risk, risk of recurrent leg wounds, to improve functional performance of basic and instrumental ADLs, and to limit LE progression.    Baseline dependent    Time 12    Period Weeks    Status Achieved   01/09/22: Exciting achievement this date! Pt  achieved 16.9% and 17.45% volume reductions since 06/2021 in R and L legs respectively. Best reductions ever achieved since 2019 with more frequent CG assistance at home.                  Plan - 05/10/22 1426     Clinical Impression Statement Ms. Gartin doing well  overall during this month long visit interval with frequent caregiver assistance for compression and skin care. Skin below the knees bilaterally is more dry today than it has been     in the last several months. Cristino Martes,  and Pt agree to increase skin care frequency to ensure paid caregivers are  providing adequate attention to skin to limit infection risk and to promote skin flexibility essential for lymphatic pumping. Provided LLE / LLQ MLD and skin care without increased pain. Max A to apply CircAid reduction kit  wraps over Rosidal foam  bilaterally. Completed anatomical measurements to determine size needed for replacement CircAid reduction kits. Nephew to look at boxes at home and replace with like size ASAP.Cont as per POC.    OT Occupational Profile and History Comprehensive Assessment- Review of records and extensive additional review of physical, cognitive, psychosocial history related to current functional performance    Occupational Profile and client history currently impacting functional performance dimensia, inconsistent CG assistance with removing wraps, providing skin care and reapplying compression between clinical visits. S    Occupational performance deficits (Please refer to evaluation for details): ADL's;IADL's;Rest and Sleep;Play;Leisure;Social Participation;Other   body image   Body Structure / Function / Physical Skills ADL;Decreased knowledge of use of DME;Pain;Edema;Scar mobility;Decreased knowledge of precautions;Skin integrity    Rehab Potential Fair    Clinical Decision Making Multiple treatment options, significant modification of task necessary    Comorbidities Affecting Occupational Performance:  Presence of comorbidities impacting occupational performance    Modification or Assistance  to Complete Evaluation  Max significant modification of tasks or assist is necessary to complete    OT Frequency Other (comment)   every other week   OT Duration 12 weeks   and PRN ongoing to ensure optimal LE management and to support caregiver. Without ongoing skilled OT condition will worsen and further functional decline is expected   OT Treatment/Interventions Self-care/ADL training;DME and/or AE instruction;Manual lymph drainage;Patient/family education;Compression bandaging;Therapeutic exercise;Manual Therapy;Other (comment)    Plan skin care to decrease infection risk and improve hydration    Recommended Other Services fit with adjustable , alternative compression wraps for optimal independence w/ donning and doffing. Cnsider CircAid with footpieces.    Consulted and Agree with Plan of Care Patient;Family member/caregiver             Patient will benefit from skilled therapeutic intervention in order to improve the following deficits and impairments:   Body Structure / Function / Physical Skills: ADL, Decreased knowledge of use of DME, Pain, Edema, Scar mobility, Decreased knowledge of precautions, Skin integrity       Visit Diagnosis: Lymphedema, not elsewhere classified    Problem List Patient Active Problem List   Diagnosis Date Noted   PVD (peripheral vascular disease) ([place]) 04/27/2021   Medication noncompliance due to cognitive impairment 10/17/2019   Chronic pain of multiple joints 08/01/2017   Age-related osteoporosis without current pathological fracture 08/01/2017   Left knee pain 09/19/2016   Rectal benign neoplasm    Asthma, chronic 04/04/2015   history of Tubular adenoma of colon 04/04/2015   CKD (chronic kidney disease) stage 3, GFR 30-59 ml/min (HCC) 04/03/2015   Edema 57/32/2025   Chronic systolic heart failure ([place]) 11/20/2013   Lymphedema of lower extremity  10/17/2013   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 [place], [place], [address] Phone: [phone]   Fax:  [phone]  Name: [name] MRN: [id number] Date of Birth: Nov 02, 1931

## 2022-05-19 ENCOUNTER — Ambulatory Visit (INDEPENDENT_AMBULATORY_CARE_PROVIDER_SITE_OTHER): Payer: Medicare Other | Admitting: Nurse Practitioner

## 2022-05-19 ENCOUNTER — Encounter: Payer: Self-pay | Admitting: Nurse Practitioner

## 2022-05-19 VITALS — BP 134/88 | HR 59 | Temp 97.7°F | Ht 59.0 in | Wt 141.0 lb

## 2022-05-19 DIAGNOSIS — J4489 Other specified chronic obstructive pulmonary disease: Secondary | ICD-10-CM

## 2022-05-19 DIAGNOSIS — I5022 Chronic systolic (congestive) heart failure: Secondary | ICD-10-CM

## 2022-05-19 DIAGNOSIS — D509 Iron deficiency anemia, unspecified: Secondary | ICD-10-CM | POA: Diagnosis not present

## 2022-05-19 DIAGNOSIS — M549 Dorsalgia, unspecified: Secondary | ICD-10-CM

## 2022-05-19 DIAGNOSIS — I890 Lymphedema, not elsewhere classified: Secondary | ICD-10-CM | POA: Diagnosis not present

## 2022-05-19 DIAGNOSIS — M810 Age-related osteoporosis without current pathological fracture: Secondary | ICD-10-CM

## 2022-05-19 DIAGNOSIS — I10 Essential (primary) hypertension: Secondary | ICD-10-CM

## 2022-05-19 DIAGNOSIS — G8929 Other chronic pain: Secondary | ICD-10-CM | POA: Diagnosis not present

## 2022-05-19 DIAGNOSIS — F22 Delusional disorders: Secondary | ICD-10-CM

## 2022-05-19 DIAGNOSIS — J449 Chronic obstructive pulmonary disease, unspecified: Secondary | ICD-10-CM | POA: Diagnosis not present

## 2022-05-19 MED ORDER — DENOSUMAB 60 MG/ML ~~LOC~~ SOSY
60.0000 mg | PREFILLED_SYRINGE | Freq: Once | SUBCUTANEOUS | Status: AC
  Administered 2022-05-19: 60 mg via SUBCUTANEOUS

## 2022-05-19 NOTE — Progress Notes (Signed)
Careteam: Patient Care Team: Lauree Chandler, NP as PCP - General (Geriatric Medicine) [name], MD (Pulmonary Disease) Jacolyn Reedy, MD as Consulting Physician (Cardiology)  PLACE OF SERVICE:  [place] Directive information Does Patient Have a Medical Advance Directive?: Yes, Type of Advance Directive: [place], Does patient want to make changes to medical advance directive?: No - Patient declined  Allergies  Allergen Reactions   Aspirin Other (See Comments)    REACTION: upset stomach   Codeine Other (See Comments)    REACTION: "MAKES MY BODY GO CRAZY; CRAMPS"   Latex Nausea And Vomiting    Chief Complaint  Patient presents with   Medical Management of Chronic Issues    4 month follow-up and prolia injection. Discuss need for shingrix and additional covid boosters or postpone if patient refuses. NCIR verified. Patient denies receiving any vaccines since last visit. Patient c/o constant back pain. Here with Niece, Apolonio Schneiders       HPI: Patient is a 88 y.o. female for routine follow up.   Reports back pain has progressively gotten worse. Niece gave her some aleve (was not aware she should not have this) Reports pain is in center back down.  Niece feels like it could be due to posture in chair and she has used pillows to help with this.   Continues to have swelling in her Lower legs, no changes in that, continues with lymphedema clinic.  Did not take advair this morning, sometimes will have worsening shortness of breath.   Niece reports she does have hallucinations but they are no worse, no behaviors associated with hallucinations.   Review of Systems:  Review of Systems  Constitutional:  Negative for chills, fever and weight loss.  HENT:  Negative for tinnitus.   Respiratory:  Negative for cough, sputum production and shortness of breath.   Cardiovascular:  Negative for chest pain, palpitations and leg swelling.   Gastrointestinal:  Negative for abdominal pain, constipation, diarrhea and heartburn.  Genitourinary:  Negative for dysuria, frequency and urgency.  Musculoskeletal:  Negative for back pain, falls, joint pain and myalgias.  Skin: Negative.   Neurological:  Negative for dizziness and headaches.  Psychiatric/Behavioral:  Negative for depression and memory loss. The patient does not have insomnia.     Past Medical History:  Diagnosis Date   Anemia, unspecified    Anxiety    Arthritis    Asthma    Delusions ([place])    Depression    Edema 2005   "since put the glass in them"    GERD (gastroesophageal reflux disease)    Lumbago    Lymphedema    Obstructive chronic bronchitis without exacerbation ([place])    Other abnormal glucose    Psychotic paranoia ([place]) 12/02/4823   Systolic heart failure ([place])    EF 35 to 40% per echo January 2015   Unspecified essential hypertension    Unspecified nonpsychotic mental disorder    Unspecified venous (peripheral) insufficiency    Unspecified venous (peripheral) insufficiency    Unspecified vitamin D deficiency    Past Surgical History:  Procedure Laterality Date   APPENDECTOMY     CERVICAL SPINE SURGERY     x 2   CHOLECYSTECTOMY     ESOPHAGOGASTRODUODENOSCOPY N/A 04/05/2015   Procedure: ESOPHAGOGASTRODUODENOSCOPY (EGD);  Surgeon: [name], MD;  Location: [facility];  Service: Endoscopy;  Laterality: N/A;   FLEXIBLE SIGMOIDOSCOPY N/A 04/05/2015   Procedure: FLEXIBLE SIGMOIDOSCOPY;  Surgeon: Norberto Sorenson  Sindy Guadeloupe, MD;  Location: [facility];  Service: Endoscopy;  Laterality: N/A;   HAND SURGERY     LUMBAR DISC SURGERY     PARTIAL HYSTERECTOMY     TONSILLECTOMY     Social History:   reports that she has never smoked. She has never used smokeless tobacco. She reports that she does not drink alcohol and does not use drugs.  Family History  Problem Relation Age of Onset   Diabetes Mother    Arthritis Father    Diabetes Paternal Grandfather         entire family on both sides   Heart disease Maternal Aunt        entire family of both sides   Cancer Paternal Aunt        type unknown   Diabetes Sister    Breast cancer Cousin     Medications: Patient's Medications  New Prescriptions   No medications on file  Previous Medications   ACETAMINOPHEN (TYLENOL) 650 MG CR TABLET    Take 650 mg by mouth as needed for pain.   ALBUTEROL (PROAIR HFA) 108 (90 BASE) MCG/ACT INHALER    INHALE 2 PUFFS EVERY 4 HOURS AS NEEDED FOR WHEEZING   CARVEDILOL (COREG) 6.25 MG TABLET    TAKE 1 TABLET BY MOUTH TWICE DAILY WITH LUNCH AND WITH SUPPER   CETIRIZINE (ZYRTEC) 10 MG TABLET    Take 1 tablet (10 mg total) by mouth as needed for allergies.   DENOSUMAB (PROLIA) 60 MG/ML SOSY INJECTION    Inject 60 mg into the skin every 6 (six) months.   FEROSUL 325 (65 FE) MG TABLET    TAKE 1 TABLET(325 MG) BY MOUTH DAILY WITH BREAKFAST   FLUTICASONE-SALMETEROL (ADVAIR HFA) 115-21 MCG/ACT INHALER    INHALE 2 PUFFS BY MOUTH TWICE DAILY   HYDRALAZINE (APRESOLINE) 50 MG TABLET    TAKE 1 TABLET BY MOUTH TWICE DAILY AT 8 AM AND 10 PM   OLANZAPINE (ZYPREXA) 5 MG TABLET    TAKE 1 TABLET(5 MG) BY MOUTH AT BEDTIME   OMEPRAZOLE (PRILOSEC) 20 MG CAPSULE    TAKE 1 CAPSULE(20 MG) BY MOUTH DAILY   POTASSIUM CHLORIDE SA (KLOR-CON M) 20 MEQ TABLET    Take 0.5 tablets (10 mEq total) by mouth daily.   TORSEMIDE (DEMADEX) 20 MG TABLET    TAKE 1/2 TABLET(10 MG) BY MOUTH DAILY  Modified Medications   No medications on file  Discontinued Medications   No medications on file    Physical Exam:  Vitals:   05/19/22 1040  BP: 134/88  Pulse: (!) 59  Temp: 97.7 F (36.5 C)  TempSrc: Temporal  SpO2: 98%  Weight: 141 lb (64 kg)  Height: 4' 11"  (1.499 m)   Body mass index is 28.48 kg/m. Wt Readings from Last 3 Encounters:  05/19/22 141 lb (64 kg)  01/27/22 134 lb (60.8 kg)  08/26/21 137 lb (62.1 kg)    Physical Exam Constitutional:      General: She is not in acute  distress.    Appearance: She is well-developed. She is not diaphoretic.  HENT:     Head: Normocephalic and atraumatic.     Mouth/Throat:     Pharynx: No oropharyngeal exudate.  Eyes:     Conjunctiva/sclera: Conjunctivae normal.     Pupils: Pupils are equal, round, and reactive to light.  Cardiovascular:     Rate and Rhythm: Normal rate and regular rhythm.     Heart sounds: Normal heart sounds.  Pulmonary:     Effort: Pulmonary effort is normal.     Breath sounds: Normal breath sounds.  Abdominal:     General: Bowel sounds are normal.     Palpations: Abdomen is soft.  Musculoskeletal:     Cervical back: Normal range of motion and neck supple.       Back:     Right lower leg: No edema.     Left lower leg: No edema.  Skin:    General: Skin is warm and dry.  Neurological:     Mental Status: She is alert. Mental status is at baseline.  Psychiatric:        Mood and Affect: Mood normal.     Labs reviewed: Basic Metabolic Panel: Recent Labs    08/26/21 1517 01/27/22 1555  NA 138 141  K 4.1 4.0  CL 102 104  CO2 27 27  GLUCOSE 142* 89  BUN 34* 31*  CREATININE 1.66* 1.36*  CALCIUM 9.9 10.0   Liver Function Tests: Recent Labs    08/26/21 1517 01/27/22 1555  AST 15 11  ALT 8 7  BILITOT 0.4 0.3  PROT 8.4* 7.8   No results for input(s): "LIPASE", "AMYLASE" in the last 8760 hours. No results for input(s): "AMMONIA" in the last 8760 hours. CBC: Recent Labs    08/26/21 1517 01/27/22 1555  WBC 5.6 5.4  NEUTROABS 4,116 3,602  HGB 10.1* 11.1*  HCT 31.9* 35.5  MCV 87.2 87.4  PLT 276 245   Lipid Panel: No results for input(s): "CHOL", "HDL", "LDLCALC", "TRIG", "CHOLHDL", "LDLDIRECT" in the last 8760 hours. TSH: No results for input(s): "TSH" in the last 8760 hours. A1C: Lab Results  Component Value Date   HGBA1C 5.5 04/14/2014     Assessment/Plan 1. Chronic systolic heart failure (HCC) Euvolemic, continues on coreg, Demadex and potassium supplement.    2. Lymphedema of both lower extremities -stable, continues on Demadex with lymphedema clinic.   3. Iron deficiency anemia, unspecified iron deficiency anemia type Continues on iron supplement, will follow up labs.  - CBC with Differential/Platelet - Iron, TIBC and Ferritin Panel  4. Essential hypertension -Blood pressure well controlled Continue current medications Follow  metabolic panel - CMP with eGFR([facility]) - CBC with Differential/Platelet  5. Paranoid delusion ([place]) -stable, no worsening of hallucinations or delusions.   6. Obstructive chronic bronchitis (HCC) -stable, encouraged to take advair as prescribed.   7. Age-related osteoporosis without current pathological fracture -Recommended to take calcium 600 mg twice daily with Vitamin D 2000 units daily and weight bearing activity 30 mins/5 days a week - denosumab (PROLIA) injection 60 mg  8. Chronic bilateral back pain, unspecified back location -family and caregiver working with her posture, encouraging her to do exercises for strength training.  -to use heating pad PRN -to use tylenol PRN pain     Return in about 6 months (around 11/19/2022) for routine follow up . [name]. [place], [facility] [phone]

## 2022-05-19 NOTE — Patient Instructions (Addendum)
Avoid NSAID's such as aleve, ibuprofen, motrin, or Advil due to abnormal kidney functions (NSAID can worsening the kidney functions)  To use heating pain three times daily as needed.  To use tylenol 325 mg 2 tablets every 6 hours as needed pain.

## 2022-05-20 LAB — IRON,TIBC AND FERRITIN PANEL
%SAT: 21 % (calc) (ref 16–45)
Ferritin: 209 ng/mL (ref 16–288)
Iron: 46 ug/dL (ref 45–160)
TIBC: 215 mcg/dL (calc) — ABNORMAL LOW (ref 250–450)

## 2022-05-20 LAB — CBC WITH DIFFERENTIAL/PLATELET
Absolute Monocytes: 419 cells/uL (ref 200–950)
Basophils Absolute: 21 cells/uL (ref 0–200)
Basophils Relative: 0.4 %
Eosinophils Absolute: 0 cells/uL — ABNORMAL LOW (ref 15–500)
Eosinophils Relative: 0 %
HCT: 33.5 % — ABNORMAL LOW (ref 35.0–45.0)
Hemoglobin: 10.6 g/dL — ABNORMAL LOW (ref 11.7–15.5)
Lymphs Abs: 1055 cells/uL (ref 850–3900)
MCH: 27.9 pg (ref 27.0–33.0)
MCHC: 31.6 g/dL — ABNORMAL LOW (ref 32.0–36.0)
MCV: 88.2 fL (ref 80.0–100.0)
MPV: 10.4 fL (ref 7.5–12.5)
Monocytes Relative: 7.9 %
Neutro Abs: 3805 cells/uL (ref 1500–7800)
Neutrophils Relative %: 71.8 %
Platelets: 213 10*3/uL (ref 140–400)
RBC: 3.8 10*6/uL (ref 3.80–5.10)
RDW: 13.8 % (ref 11.0–15.0)
Total Lymphocyte: 19.9 %
WBC: 5.3 10*3/uL (ref 3.8–10.8)

## 2022-05-20 LAB — COMPLETE METABOLIC PANEL WITH GFR
AG Ratio: 0.9 (calc) — ABNORMAL LOW (ref 1.0–2.5)
ALT: 7 U/L (ref 6–29)
AST: 12 U/L (ref 10–35)
Albumin: 3.4 g/dL — ABNORMAL LOW (ref 3.6–5.1)
Alkaline phosphatase (APISO): 69 U/L (ref 37–153)
BUN/Creatinine Ratio: 16 (calc) (ref 6–22)
BUN: 23 mg/dL (ref 7–25)
CO2: 24 mmol/L (ref 20–32)
Calcium: 9.4 mg/dL (ref 8.6–10.4)
Chloride: 111 mmol/L — ABNORMAL HIGH (ref 98–110)
Creat: 1.4 mg/dL — ABNORMAL HIGH (ref 0.60–0.95)
Globulin: 3.8 g/dL (calc) — ABNORMAL HIGH (ref 1.9–3.7)
Glucose, Bld: 105 mg/dL (ref 65–139)
Potassium: 4.4 mmol/L (ref 3.5–5.3)
Sodium: 142 mmol/L (ref 135–146)
Total Bilirubin: 0.3 mg/dL (ref 0.2–1.2)
Total Protein: 7.2 g/dL (ref 6.1–8.1)
eGFR: 36 mL/min/{1.73_m2} — ABNORMAL LOW (ref >=60)

## 2022-05-22 ENCOUNTER — Ambulatory Visit: Payer: Medicare Other | Admitting: Occupational Therapy

## 2022-06-05 ENCOUNTER — Ambulatory Visit: Payer: Medicare Other | Attending: Family | Admitting: Occupational Therapy

## 2022-06-19 ENCOUNTER — Ambulatory Visit: Payer: Medicare Other | Admitting: Occupational Therapy

## 2022-07-10 ENCOUNTER — Ambulatory Visit: Payer: Medicare Other | Attending: Family | Admitting: Occupational Therapy

## 2022-07-10 DIAGNOSIS — I890 Lymphedema, not elsewhere classified: Secondary | ICD-10-CM | POA: Insufficient documentation

## 2022-07-11 NOTE — Patient Instructions (Signed)

## 2022-07-14 NOTE — Therapy (Signed)
[place] MAIN [facility] SERVICES [address] [place], [place], [address] Phone: [phone]   Fax:  [phone]  Occupational Therapy Treatment  Patient Details  Name: [name] MRN: [id number] Date of Birth: [birth year] No data recorded  Encounter Date: 07/10/2022   OT End of Session - 07/14/22 1259     Number of Visits 36    Date for OT Re-Evaluation 08/06/22    OT Start Time 0310    OT Stop Time 0415    OT Time Calculation (min) 65 min    Activity Tolerance Patient tolerated treatment well;Patient limited by pain;No increased pain    Behavior During Therapy WFL for tasks assessed/performed             Past Medical History:  Diagnosis Date   Anemia, unspecified    Anxiety    Arthritis    Asthma    Delusions ([place])    Depression    Edema 2005   "since put the glass in them"    GERD (gastroesophageal reflux disease)    Lumbago    Lymphedema    Obstructive chronic bronchitis without exacerbation ([place])    Other abnormal glucose    Psychotic paranoia ([place]) 03/31/3761   Systolic heart failure ([place])    EF 35 to 40% per echo January 2015   Unspecified essential hypertension    Unspecified nonpsychotic mental disorder    Unspecified venous (peripheral) insufficiency    Unspecified venous (peripheral) insufficiency    Unspecified vitamin D deficiency     Past Surgical History:  Procedure Laterality Date   APPENDECTOMY     CERVICAL SPINE SURGERY     x 2   CHOLECYSTECTOMY     ESOPHAGOGASTRODUODENOSCOPY N/A 04/05/2015   Procedure: ESOPHAGOGASTRODUODENOSCOPY (EGD);  Surgeon: [name], MD;  Location: [facility];  Service: Endoscopy;  Laterality: N/A;   FLEXIBLE SIGMOIDOSCOPY N/A 04/05/2015   Procedure: FLEXIBLE SIGMOIDOSCOPY;  Surgeon: [name], MD;  Location: [facility];  Service: Endoscopy;  Laterality: N/A;   HAND SURGERY     LUMBAR DISC SURGERY     PARTIAL HYSTERECTOMY     TONSILLECTOMY      There were  no vitals filed for this visit.   Subjective Assessment - 07/14/22 1259     Subjective  Pt Ms. Rucci presents for OT to address BLE lymphedema. She was last seen on 05/08/22. Ms. Selmon is accompanied by her niece.  Pt reports 5/10 lower back pain today. Pt presents without compression in place. Caregiver forgot to bring compression leggings.    Patient is accompanied by: Family member    Pertinent History Chronic obstructive bronchitis, CVI, anxiety , systoloc heart failure, asthma, arthritis, multiple spinal surgeries w/ chronic back pain, DM,    Limitations Difficulty walking, impaired standing tolerance, impaired transfers, decreased joint AROM at toes, ankles and knees, chronic leg swelling , chronic pain. increased infection and falls risks, cognitive impairment, generaized weakness    Repetition Increases Symptoms    Special Tests + Stemmer bilaterally    Patient Stated Goals reduce swelling and legs feel better    Pain Onset Other (comment)                          OT Treatments/Exercises (OP) - 07/14/22 1300       ADLs   ADL Education Given Yes      Manual Therapy   Manual Therapy Edema management  Manual Lymphatic Drainage (MLD) MLD to LLE as established. Pants worn to clinic severly limit access to inguinal LN      for proximal MLD.    Compression Bandaging no compression available to apply after session. CG will apply as soon as they arrive home.    Other Manual Therapy skin care to LLE with low ph castor oil concurrent w MLD                    OT Education - 07/14/22 1300     Education provided Yes    Education Details Continued Pt/ CG edu for lymphedema self care  and home program throughout session. Topics include multilayer, gradient compression wrapping, simple self-MLD, therapeutic lymphatic pumping exercises, skin/nail care, risk reduction factors and LE precautions, compression garments/recommendations and wear and care schedule and  compression garment donning / doffing using assistive devices. All questions answered to the Pt's satisfaction, and Pt demonstrates understanding by report.    Person(s) Educated Patient;Caregiver(s)    Methods Explanation;Demonstration    Comprehension Verbalized understanding;Returned demonstration;Need further instruction                 OT Long Term Goals - 07/14/22 1259       OT LONG TERM GOAL #1   Title With maximum caregiver assistance Pt will be able to apply knee length, multi-layer, short stretch compression wraps daily from ankle to tibial tuberosity on one leg at a time using correct gradient techniques to return affected limb/s, as closely as possible, to premorbid size and shape, to limit leg pain and infection risk, and to improve safe functional mobility and ADLs performance. 01/03/21: Manufacturer's rep coming next week to assist with fitting adjustable, Velcro style , knee length compression leg wraps. Hopefiullly Pt will be able to manage these more independently, and may be more do-able for caregivers. Drawback is they do not manage foot swelling well.    Baseline dependent    Time 4    Period Days    Status Revised   CG has never been comfortable applying compression wraps, but he has become quite comfortable applying adjustable, velcro wrap style compression leggings, and does so 3 or more times weekl since 06/2021.     OT LONG TERM GOAL #2   Title With Maximum caregiver assistance Pt will be able to verbalize signs and symptoms of cellulitis infection and identify lymphedema precautions using printed resource (modified independence) for reference to decrease infection risk and limit LE progression over time.    Baseline dependent    Time 6    Period Days    Status Achieved      OT LONG TERM GOAL #3   Title With Maximum caregiver assistance Pt >85 % compliant with all daily, LE self-care protocols for home program, including simple self-manual lymphatic drainage  (MLD), skin care, lymphatic pumping ther ex, skin care, and compression wraps and garments  to limit LE progression and further functional decline.    Baseline dependent    Time 12    Period Weeks    Status Revised   Previously: Pt lives alone and has no consistent CG assistance w LE self-care.01/09/22 : At present Pt is dramatically increasing compliance with regular and more frequent skin care and compression , at least 3 x weekly with assistance of nephew and wife.     OT LONG TERM GOAL #4   Baseline dependent    Time 12    Period Weeks  Status Deferred      OT LONG TERM GOAL #5   Title Pt to tolerate compression wraps/ garments in keeping w/ prescribed daily wear regime within 1 week of issue date to progress and retain clinical and functional gains and to limit LE progression.    Baseline dependent    Time 12    Period Weeks    Status Achieved   Met, but non-compliant with daytime compression and wraps     OT LONG TERM GOAL #6   Title Pt to achieve at least 10% LLE limb volume reduction bilaterally during modified Intensive Phase CDT to reduce infection risk, risk of recurrent leg wounds, to improve functional performance of basic and instrumental ADLs, and to limit LE progression.    Baseline dependent    Time 12    Period Weeks    Status Achieved   01/09/22: Exciting achievement this date! Pt achieved 16.9% and 17.45% volume reductions since 06/2021 in R and L legs respectively. Best reductions ever achieved since 2019 with more frequent CG assistance at home.                  Plan - 07/14/22 1259     Clinical Impression Statement Pt reported reduced back pain after moist heat pack throughout session. Hot pack assists with lymphatic function  during therapy by promoting relaxation and analgeic effect for pain. Ms. Leeb doing well  overall during this month long visit interval with frequent caregiver assistance for compression and skin care. Skin below the knees  bilaterally is more dry today than it has been     in the last several months. Cristino Martes,  and Pt agree to increase skin care frequency to ensure paid caregivers are  providing adequate attention to skin to limit infection risk and to promote skin flexibility essential for lymphatic pumping. Provided LLE / LLQ MLD and skin care without increased pain. Max A to apply CircAid reduction kit  wraps over Rosidal foam  bilaterally. Completed anatomical measurements to determine size needed for replacement CircAid reduction kits. Nephew to look at boxes at home and replace with like size ASAP.Cont as per POC.    OT Occupational Profile and History Comprehensive Assessment- Review of records and extensive additional review of physical, cognitive, psychosocial history related to current functional performance    Occupational Profile and client history currently impacting functional performance dimensia, inconsistent CG assistance with removing wraps, providing skin care and reapplying compression between clinical visits. S    Occupational performance deficits (Please refer to evaluation for details): ADL's;IADL's;Rest and Sleep;Play;Leisure;Social Participation;Other   body image   Body Structure / Function / Physical Skills ADL;Decreased knowledge of use of DME;Pain;Edema;Scar mobility;Decreased knowledge of precautions;Skin integrity    Rehab Potential Fair    Clinical Decision Making Multiple treatment options, significant modification of task necessary    Comorbidities Affecting Occupational Performance: Presence of comorbidities impacting occupational performance    Modification or Assistance to Complete Evaluation  Max significant modification of tasks or assist is necessary to complete    OT Frequency Other (comment)   every other week   OT Duration 12 weeks   and PRN ongoing to ensure optimal LE management and to support caregiver. Without ongoing skilled OT condition will worsen and further functional  decline is expected   OT Treatment/Interventions Self-care/ADL training;DME and/or AE instruction;Manual lymph drainage;Patient/family education;Compression bandaging;Therapeutic exercise;Manual Therapy;Other (comment)    Plan skin care to decrease infection risk and improve hydration    Recommended  Other Services fit with adjustable , alternative compression wraps for optimal independence w/ donning and doffing. Cnsider CircAid with footpieces.    Consulted and Agree with Plan of Care Patient;Family member/caregiver             Patient will benefit from skilled therapeutic intervention in order to improve the following deficits and impairments:   Body Structure / Function / Physical Skills: ADL, Decreased knowledge of use of DME, Pain, Edema, Scar mobility, Decreased knowledge of precautions, Skin integrity       Visit Diagnosis: Lymphedema, not elsewhere classified    Problem List Patient Active Problem List   Diagnosis Date Noted   PVD (peripheral vascular disease) ([place]) 04/27/2021   Medication noncompliance due to cognitive impairment 10/17/2019   Chronic pain of multiple joints 08/01/2017   Age-related osteoporosis without current pathological fracture 08/01/2017   Left knee pain 09/19/2016   Rectal benign neoplasm    Asthma, chronic 04/04/2015   history of Tubular adenoma of colon 04/04/2015   CKD (chronic kidney disease) stage 3, GFR 30-59 ml/min ([facility]CC) 04/03/2015   Edema 62/82/4175   Chronic systolic heart failure ([place]) 11/20/2013   Lymphedema of lower extremity 10/17/2013   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 [place], [place], [address] Phone: [phone]   Fax:  [phone]  Name: JAVAEH MUSCATELLO MRN: [id number] Date of Birth: [birth year]

## 2022-07-24 ENCOUNTER — Ambulatory Visit: Payer: Medicare Other | Admitting: Occupational Therapy

## 2022-07-24 ENCOUNTER — Other Ambulatory Visit: Payer: Self-pay | Admitting: Nurse Practitioner

## 2022-07-24 DIAGNOSIS — I890 Lymphedema, not elsewhere classified: Secondary | ICD-10-CM

## 2022-07-24 DIAGNOSIS — I5022 Chronic systolic (congestive) heart failure: Secondary | ICD-10-CM

## 2022-08-07 ENCOUNTER — Ambulatory Visit: Payer: Medicare Other | Admitting: Occupational Therapy

## 2022-08-07 ENCOUNTER — Other Ambulatory Visit: Payer: Self-pay | Admitting: Nurse Practitioner

## 2022-08-21 ENCOUNTER — Ambulatory Visit: Payer: Medicare Other | Attending: Family | Admitting: Occupational Therapy

## 2022-08-21 DIAGNOSIS — I890 Lymphedema, not elsewhere classified: Secondary | ICD-10-CM

## 2022-08-22 NOTE — Therapy (Signed)
[name] [facility] SERVICES [address] [place], [place], [address] Phone: [phone]   Fax:  [phone]  Occupational Therapy Treatment Note and Progress Report  Patient Details  Name: [name] MRN: [id number] Date of Birth: [birth year] No data recorded  Encounter Date: 08/21/2022  Reporting Period: 01/09/22 - 08/21/22   OT End of Session - 08/21/22 1524     Visit Number 60    Number of Visits 72    Date for OT Re-Evaluation 11/19/22    OT Start Time 0310    OT Stop Time 0415    OT Time Calculation (min) 65 min    Activity Tolerance Patient tolerated treatment well;Patient limited by pain;No increased pain    Behavior During Therapy WFL for tasks assessed/performed             Past Medical History:  Diagnosis Date   Anemia, unspecified    Anxiety    Arthritis    Asthma    Delusions ([place])    Depression    Edema 2005   "since put the glass in them"    GERD (gastroesophageal reflux disease)    Lumbago    Lymphedema    Obstructive chronic bronchitis without exacerbation ([place])    Other abnormal glucose    Psychotic paranoia ([place]) 0/12/1115   Systolic heart failure ([place])    EF 35 to 40% per echo January 2015   Unspecified essential hypertension    Unspecified nonpsychotic mental disorder    Unspecified venous (peripheral) insufficiency    Unspecified venous (peripheral) insufficiency    Unspecified vitamin D deficiency     Past Surgical History:  Procedure Laterality Date   APPENDECTOMY     CERVICAL SPINE SURGERY     x 2   CHOLECYSTECTOMY     ESOPHAGOGASTRODUODENOSCOPY N/A 04/05/2015   Procedure: ESOPHAGOGASTRODUODENOSCOPY (EGD);  Surgeon: [name], MD;  Location: [facility];  Service: Endoscopy;  Laterality: N/A;   FLEXIBLE SIGMOIDOSCOPY N/A 04/05/2015   Procedure: FLEXIBLE SIGMOIDOSCOPY;  Surgeon: [name], MD;  Location: [facility];  Service: Endoscopy;  Laterality: N/A;   HAND SURGERY      LUMBAR DISC SURGERY     PARTIAL HYSTERECTOMY     TONSILLECTOMY      There were no vitals filed for this visit.   Subjective Assessment - 08/22/22 1546     Subjective  Pt Ms. Blank presents for OT to address BLE lymphedema. She was last seen on 07/14/22. [name] is accompanied by her nephew.  Pt reports 5/10 lower back pain today. Pt presents without compression in place. Caregiver reports Pt missed last visit due to a fall at home the night before and feeling  sore.    Patient is accompanied by: Family member    Pertinent History Chronic obstructive bronchitis, CVI, anxiety , systoloc heart failure, asthma, arthritis, multiple spinal surgeries w/ chronic back pain, DM,    Limitations Difficulty walking, impaired standing tolerance, impaired transfers, decreased joint AROM at toes, ankles and knees, chronic leg swelling , chronic pain. increased infection and falls risks, cognitive impairment, generaized weakness    Repetition Increases Symptoms    Special Tests + [place] bilaterally    Patient Stated Goals reduce swelling and legs feel better    Pain Onset Other (comment)                 LYMPHEDEMA/ONCOLOGY QUESTIONNAIRE - 08/22/22 1549       Lymphedema Assessments  Lymphedema Assessments Lower extremities      Right Lower Extremity Lymphedema   Other R leg from ankle to tibia tuberosity (A-D) measures 3443.5 mll.    Other RLE limb volume is INCREASED by 12.2% since last measuredon 01/09/22 (50th visit).      Left Lower Extremity Lymphedema   Other L leg from ankle to tibia tuberosity (A-D) measures 3797.4 ml.    Other LLE limb volume is INCReased  by 6.8 % since last measured on 01/09/22 (50th visit).                     OT Treatments/Exercises (OP) - 08/22/22 1547       ADLs   ADL Education Given Yes      Manual Therapy   Manual Therapy Edema management    Manual therapy comments Progress assessmentassessment    Edema Management BLE comparative limb  volumetrics    Compression Bandaging No time to apply compression wraps due to time constraints. CG will apply at home after visit.                    OT Education - 08/22/22 1554     Education provided Yes    Education Details Continued Pt/ CG edu for lymphedema self care  and home program throughout session. Topics include multilayer, gradient compression wrapping, simple self-MLD, therapeutic lymphatic pumping exercises, skin/nail care, risk reduction factors and LE precautions, compression garments/recommendations and wear and care schedule and compression garment donning / doffing using assistive devices. All questions answered to the Pt's satisfaction, and Pt demonstrates understanding by report.    Person(s) Educated Patient;Caregiver(s)    Methods Explanation;Demonstration    Comprehension Verbalized understanding;Returned demonstration;Need further instruction                 OT Long Term Goals - 08/21/22 1525       OT LONG TERM GOAL #1   Title With maximum caregiver assistance Pt will be able to apply knee length, multi-layer, short stretch compression wraps daily from ankle to tibial tuberosity on one leg at a time using correct gradient techniques to return affected limb/s, as closely as possible, to premorbid size and shape, to limit leg pain and infection risk, and to improve safe functional mobility and ADLs performance. 01/03/21: Manufacturer's rep coming next week to assist with fitting adjustable, Velcro style , knee length compression leg wraps. Hopefiullly Pt will be able to manage these more independently, and may be more do-able for caregivers. Drawback is they do not manage foot swelling well.    Baseline dependent    Time 4    Period Days    Status Achieved   08/21/22 Velcro wraps showing a lot of wear and need replacement withing the next couple of months.     OT LONG TERM GOAL #2   Title With Maximum caregiver assistance Pt will be able to verbalize  signs and symptoms of cellulitis infection and identify lymphedema precautions using printed resource (modified independence) for reference to decrease infection risk and limit LE progression over time.    Baseline dependent    Time 6    Period Days    Status Achieved      OT LONG TERM GOAL #3   Title With Maximum caregiver assistance Pt >85 % compliant with all daily, LE self-care protocols for home program, skin care, lymphatic pumping ther ex and compression wraps and garments  to limit LE progression and further functional  decline.    Baseline dependent    Time 12    Period Weeks    Status Achieved   08/21/22: Compliance appears to be less consistent over the past couple of months with worsening skin condition and increased swelling noted today. CG reports wraps and skin care at least 4 days/weekly     OT LONG TERM GOAL #4   Baseline dependent    Time 12    Period Weeks    Status Deferred      OT LONG TERM GOAL #5   Title Pt to tolerate compression wraps/ garments in keeping w/ prescribed daily wear regime within 1 week of issue date to progress and retain clinical and functional gains and to limit LE progression.    Baseline dependent    Time 12    Period Weeks    Status Achieved   Met, but non-compliant with daytime compression and wraps     OT LONG TERM GOAL #6   Title Pt to achieve at least 10% LLE limb volume reduction bilaterally during modified Intensive Phase CDT to reduce infection risk, risk of recurrent leg wounds, to improve functional performance of basic and instrumental ADLs, and to limit LE progression.    Baseline dependent    Time 12    Period Weeks    Status Achieved   01/09/22: Exciting achievement this date! Pt achieved 16.9% and 17.45% volume reductions since 06/2021 in R and L legs respectively. Best reductions ever achieved since 2019 with more frequent CG assistance at home.                  Plan - 08/22/22 1558     Clinical Impression  Statement Please review LONG TERM GOALs section for progress to date. Pt last seen on 07/14/22. Her skin contdition belaterall below the knees is declined    since last several sessions. It appears that caregiver consistency my have reduced somewhat, but I hope this is not a new pattern . Pt has done so well over the past several months. BLE comparative limb volumetrics reveal increases in both legs. RLE limb volume is INCREASED by 12.2% since last measuredon 01/09/22 (50th visit).LLE limb volume is INCReased  by 6.8 % since last measured on 01/09/22 (50th visit).Fluctuations tend to be quite large [name] continues to benefit from skilled OT for lymphedema care. This condition is chronic and progressive and her case is quite severe. She is unable to consistently apply compression, perform skin care, perform simple self-MLD and perform perscribed ther ex with out maximum caregiver assistance on a continuous basis. Without follow along skilled OT lymphedema will worsen and further functional decline is expected.    OT Occupational Profile and History Comprehensive Assessment- Review of records and extensive additional review of physical, cognitive, psychosocial history related to current functional performance    Occupational Profile and client history currently impacting functional performance dimensia, inconsistent CG assistance with removing wraps, providing skin care and reapplying compression between clinical visits. S    Occupational performance deficits (Please refer to evaluation for details): ADL's;IADL's;Rest and Sleep;Play;Leisure;Social Participation;Other   body image   Body Structure / Function / Physical Skills ADL;Decreased knowledge of use of DME;Pain;Edema;Scar mobility;Decreased knowledge of precautions;Skin integrity    Rehab Potential Fair    Clinical Decision Making Multiple treatment options, significant modification of task necessary    Comorbidities Affecting Occupational  Performance: Presence of comorbidities impacting occupational performance    Modification or Assistance to Complete Evaluation  Max  significant modification of tasks or assist is necessary to complete    OT Frequency Other (comment)   every other week   OT Duration 12 weeks   and PRN ongoing to ensure optimal LE management and to support caregiver. Without ongoing skilled OT condition will worsen and further functional decline is expected   OT Treatment/Interventions Self-care/ADL training;DME and/or AE instruction;Manual lymph drainage;Patient/family education;Compression bandaging;Therapeutic exercise;Manual Therapy;Other (comment)    Plan skin care to decrease infection risk and improve hydration    Recommended Other Services fit with adjustable , alternative compression wraps for optimal independence w/ donning and doffing. Cnsider CircAid with footpieces.    Consulted and Agree with Plan of Care Patient;Family member/caregiver             Patient will benefit from skilled therapeutic intervention in order to improve the following deficits and impairments:   Body Structure / Function / Physical Skills: ADL, Decreased knowledge of use of DME, Pain, Edema, Scar mobility, Decreased knowledge of precautions, Skin integrity       Visit Diagnosis: Lymphedema, not elsewhere classified    Problem List Patient Active Problem List   Diagnosis Date Noted   PVD (peripheral vascular disease) ([place]) 04/27/2021   Medication noncompliance due to cognitive impairment 10/17/2019   Chronic pain of multiple joints 08/01/2017   Age-related osteoporosis without current pathological fracture 08/01/2017   Left knee pain 09/19/2016   Rectal benign neoplasm    Asthma, chronic 04/04/2015   history of Tubular adenoma of colon 04/04/2015   CKD (chronic kidney disease) stage 3, GFR 30-59 ml/min (HCC) 04/03/2015   Edema 07/22/3006   Chronic systolic heart failure ([place]) 11/20/2013   Lymphedema of lower  extremity 10/17/2013   Psychotic paranoia ([place]) 04/04/2013   MGUS (monoclonal gammopathy of unknown significance) 05/15/2012   Vitamin D deficiency 01/29/2010   DERMATITIS 09/30/2009   Paranoid delusion ([place]) 08/02/2008   Obstructive chronic bronchitis ([place]) 08/02/2008   Essential hypertension 11/11/2007   Venous (peripheral) insufficiency 11/11/2007   RENAL CALCULUS 11/11/2007   LOW BACK PAIN, CHRONIC 11/11/2007    Andrey Spearman, MS, OTR/L, CLT-LANA 08/22/22 4:09 PM   [place] [facility] SERVICES [place], [place], [address] Phone: [phone]   Fax:  [phone]  Name: [name] MRN: [id number] Date of Birth: Sep 28, 1932

## 2022-08-22 NOTE — Patient Instructions (Signed)

## 2022-09-12 ENCOUNTER — Encounter: Payer: Medicare Other | Admitting: Nurse Practitioner

## 2022-09-13 ENCOUNTER — Emergency Department (HOSPITAL_COMMUNITY): Payer: Medicare Other

## 2022-09-13 ENCOUNTER — Emergency Department (HOSPITAL_COMMUNITY)
Admission: EM | Admit: 2022-09-13 | Discharge: 2022-09-13 | Disposition: A | Discharge status: Home / Self Care | Payer: Medicare Other | Attending: Student | Admitting: Student

## 2022-09-13 ENCOUNTER — Encounter (HOSPITAL_COMMUNITY): Payer: Self-pay | Admitting: Radiology

## 2022-09-13 ENCOUNTER — Other Ambulatory Visit: Payer: Self-pay

## 2022-09-13 DIAGNOSIS — I959 Hypotension, unspecified: Secondary | ICD-10-CM | POA: Diagnosis not present

## 2022-09-13 DIAGNOSIS — R0689 Other abnormalities of breathing: Secondary | ICD-10-CM | POA: Diagnosis not present

## 2022-09-13 DIAGNOSIS — S0083XA Contusion of other part of head, initial encounter: Secondary | ICD-10-CM | POA: Diagnosis not present

## 2022-09-13 DIAGNOSIS — J45909 Unspecified asthma, uncomplicated: Secondary | ICD-10-CM | POA: Diagnosis not present

## 2022-09-13 DIAGNOSIS — Z7951 Long term (current) use of inhaled steroids: Secondary | ICD-10-CM | POA: Insufficient documentation

## 2022-09-13 DIAGNOSIS — W19XXXA Unspecified fall, initial encounter: Secondary | ICD-10-CM | POA: Insufficient documentation

## 2022-09-13 DIAGNOSIS — F0390 Unspecified dementia without behavioral disturbance: Secondary | ICD-10-CM | POA: Insufficient documentation

## 2022-09-13 DIAGNOSIS — Z79899 Other long term (current) drug therapy: Secondary | ICD-10-CM | POA: Diagnosis not present

## 2022-09-13 DIAGNOSIS — I130 Hypertensive heart and chronic kidney disease with heart failure and stage 1 through stage 4 chronic kidney disease, or unspecified chronic kidney disease: Secondary | ICD-10-CM | POA: Insufficient documentation

## 2022-09-13 DIAGNOSIS — R609 Edema, unspecified: Secondary | ICD-10-CM | POA: Diagnosis not present

## 2022-09-13 DIAGNOSIS — N183 Chronic kidney disease, stage 3 unspecified: Secondary | ICD-10-CM | POA: Diagnosis not present

## 2022-09-13 DIAGNOSIS — I5022 Chronic systolic (congestive) heart failure: Secondary | ICD-10-CM | POA: Diagnosis not present

## 2022-09-13 DIAGNOSIS — Z91040 Latex allergy status: Secondary | ICD-10-CM | POA: Insufficient documentation

## 2022-09-13 DIAGNOSIS — S0993XA Unspecified injury of face, initial encounter: Secondary | ICD-10-CM | POA: Diagnosis present

## 2022-09-13 DIAGNOSIS — T68XXXA Hypothermia, initial encounter: Secondary | ICD-10-CM | POA: Diagnosis not present

## 2022-09-13 LAB — COMPREHENSIVE METABOLIC PANEL
ALT: 16 U/L (ref 0–44)
AST: 20 U/L (ref 15–41)
Albumin: 3.1 g/dL — ABNORMAL LOW (ref 3.5–5.0)
Alkaline Phosphatase: 71 U/L (ref 38–126)
Anion gap: 5 (ref 5–15)
BUN: 31 mg/dL — ABNORMAL HIGH (ref 8–23)
CO2: 25 mmol/L (ref 22–32)
Calcium: 9.3 mg/dL (ref 8.9–10.3)
Chloride: 110 mmol/L (ref 98–111)
Creatinine, Ser: 1.33 mg/dL — ABNORMAL HIGH (ref 0.44–1.00)
GFR, Estimated: 38 mL/min — ABNORMAL LOW (ref >60)
Glucose, Bld: 102 mg/dL — ABNORMAL HIGH (ref 70–99)
Potassium: 5 mmol/L (ref 3.5–5.1)
Sodium: 140 mmol/L (ref 135–145)
Total Bilirubin: 0.9 mg/dL (ref 0.3–1.2)
Total Protein: 8.2 g/dL — ABNORMAL HIGH (ref 6.5–8.1)

## 2022-09-13 LAB — CBC WITH DIFFERENTIAL/PLATELET
Abs Immature Granulocytes: 0.03 10*3/uL (ref 0.00–0.07)
Basophils Absolute: 0 10*3/uL (ref 0.0–0.1)
Basophils Relative: 0 %
Eosinophils Absolute: 0 10*3/uL (ref 0.0–0.5)
Eosinophils Relative: 0 %
HCT: 31.1 % — ABNORMAL LOW (ref 36.0–46.0)
Hemoglobin: 9.4 g/dL — ABNORMAL LOW (ref 12.0–15.0)
Immature Granulocytes: 0 %
Lymphocytes Relative: 12 %
Lymphs Abs: 0.8 10*3/uL (ref 0.7–4.0)
MCH: 27.1 pg (ref 26.0–34.0)
MCHC: 30.2 g/dL (ref 30.0–36.0)
MCV: 89.6 fL (ref 80.0–100.0)
Monocytes Absolute: 0.6 10*3/uL (ref 0.1–1.0)
Monocytes Relative: 8 %
Neutro Abs: 5.7 10*3/uL (ref 1.7–7.7)
Neutrophils Relative %: 80 %
Platelets: 247 10*3/uL (ref 150–400)
RBC: 3.47 MIL/uL — ABNORMAL LOW (ref 3.87–5.11)
RDW: 14.4 % (ref 11.5–15.5)
WBC: 7.2 10*3/uL (ref 4.0–10.5)
nRBC: 0 % (ref 0.0–0.2)

## 2022-09-13 NOTE — ED Provider Notes (Signed)
[facility] DEPT Provider Note  CSN: [id number] Arrival date & time: 09/13/22 1515  Chief Complaint(s) Fall  HPI [name] is a 88 y.o. female with PMH dementia, anxiety, asthma, CHF who presents emergency department for evaluation of a fall.  Patient lives at home with home health and she got out of bed today, took 1 step and fell striking her head against the side of a nightstand.  She arrives with complaints of forehead pain over the site of a visible hematoma but denies chest pain, shortness of breath, abdominal pain, nausea, vomiting, numbness, tingling, weakness or other systemic, neurologic or traumatic complaints.   Past Medical History Past Medical History:  Diagnosis Date   Anemia, unspecified    Anxiety    Arthritis    Asthma    Delusions ([place])    Depression    Edema 2005   "since put the glass in them"    GERD (gastroesophageal reflux disease)    Lumbago    Lymphedema    Obstructive chronic bronchitis without exacerbation    Other abnormal glucose    Psychotic paranoia ([place]) 07/04/6212   Systolic heart failure ([place])    EF 35 to 40% per echo January 2015   Unspecified essential hypertension    Unspecified nonpsychotic mental disorder    Unspecified venous (peripheral) insufficiency    Unspecified venous (peripheral) insufficiency    Unspecified vitamin D deficiency    Patient Active Problem List   Diagnosis Date Noted   PVD (peripheral vascular disease) ([place]) 04/27/2021   Medication noncompliance due to cognitive impairment 10/17/2019   Chronic pain of multiple joints 08/01/2017   Age-related osteoporosis without current pathological fracture 08/01/2017   Left knee pain 09/19/2016   Rectal benign neoplasm    Asthma, chronic 04/04/2015   history of Tubular adenoma of colon 04/04/2015   CKD (chronic kidney disease) stage 3, GFR 30-59 ml/min ([place]) 04/03/2015   Edema 08/65/7846   Chronic systolic heart failure ([place])  11/20/2013   Lymphedema of lower extremity 10/17/2013   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 Authorizing Provider  acetaminophen (TYLENOL) 650 MG CR tablet Take 650 mg by mouth as needed for pain.    [provider]  albuterol (PROAIR HFA) 108 (90 Base) MCG/ACT inhaler INHALE 2 PUFFS EVERY 4 HOURS AS NEEDED FOR WHEEZING 01/27/22   Lauree Chandler, NP  carvedilol (COREG) 6.25 MG tablet TAKE 1 TABLET BY MOUTH TWICE DAILY WITH LUNCH AND WITH SUPPER 04/19/22   Lauree Chandler, NP  cetirizine (ZYRTEC) 10 MG tablet TAKE 1 TABLET BY MOUTH AS NEEDED FOR ALLERGIES 08/07/22   Lauree Chandler, NP  denosumab (PROLIA) 60 MG/ML SOSY injection Inject 60 mg into the skin every 6 (six) months.    [provider]  FEROSUL 325 (65 Fe) MG tablet TAKE 1 TABLET(325 MG) BY MOUTH DAILY WITH BREAKFAST 03/15/22   Lauree Chandler, NP  fluticasone-salmeterol (ADVAIR HFA) 115-21 MCG/ACT inhaler INHALE 2 PUFFS BY MOUTH TWICE DAILY 01/27/22   Lauree Chandler, NP  hydrALAZINE (APRESOLINE) 50 MG tablet TAKE 1  TABLET BY MOUTH TWICE DAILY AT 8 AM AND 10 PM 01/27/22   Lauree Chandler, NP  OLANZapine (ZYPREXA) 5 MG tablet TAKE 1 TABLET(5 MG) BY MOUTH AT BEDTIME 01/27/22   Lauree Chandler, NP  omeprazole (PRILOSEC) 20 MG capsule TAKE 1 CAPSULE(20 MG) BY MOUTH DAILY Patient not taking: Reported on 05/19/2022 11/01/21   Lauree Chandler, NP  potassium chloride SA (KLOR-CON M) 20 MEQ tablet TAKE 2 TABLETS(40 MEQ) BY MOUTH TWICE DAILY 07/24/22   Lauree Chandler, NP  torsemide (DEMADEX) 20 MG tablet TAKE 1/2 TABLET(10 MG) BY MOUTH DAILY 04/19/22   Lauree Chandler, NP                                                                                                                                    Past Surgical History Past Surgical History:  Procedure Laterality Date   APPENDECTOMY     CERVICAL SPINE SURGERY     x 2   CHOLECYSTECTOMY     ESOPHAGOGASTRODUODENOSCOPY N/A 04/05/2015   Procedure: ESOPHAGOGASTRODUODENOSCOPY (EGD);  Surgeon: [name], MD;  Location: [facility];  Service: Endoscopy;  Laterality: N/A;   FLEXIBLE SIGMOIDOSCOPY N/A 04/05/2015   Procedure: FLEXIBLE SIGMOIDOSCOPY;  Surgeon: [name], MD;  Location: [facility];  Service: Endoscopy;  Laterality: N/A;   HAND SURGERY     LUMBAR DISC SURGERY     PARTIAL HYSTERECTOMY     TONSILLECTOMY     Family History Family History  Problem Relation Age of Onset   Diabetes Mother    Arthritis Father    Diabetes Paternal Grandfather        entire family on both sides   Heart disease Maternal Aunt        entire family of both sides   Cancer Paternal Aunt        type unknown   Diabetes Sister    Breast cancer Cousin     Social History Social History   Tobacco Use   Smoking status: Never   Smokeless tobacco: Never  Vaping Use   Vaping Use: Never used  Substance Use Topics   Alcohol use: No   Drug use: No   Allergies Aspirin, Codeine, and Latex  Review of Systems Review of Systems  Skin:  Positive for wound.  Neurological:  Positive for headaches.    Physical Exam Vital Signs  I have reviewed the triage vital signs BP (!) 145/110   Pulse 78   Temp 98.7 F (37.1 C) (Oral)   Resp 18   SpO2 99%   Physical Exam Vitals and nursing note reviewed.  Constitutional:      General: She is not in acute distress.    Appearance: She is well-developed.  HENT:     Head: Normocephalic.     Comments: Right forehead hematoma Eyes:      Conjunctiva/sclera: Conjunctivae normal.  Cardiovascular:  Rate and Rhythm: Normal rate and regular rhythm.     Heart sounds: No murmur heard. Pulmonary:     Effort: Pulmonary effort is normal. No respiratory distress.     Breath sounds: Normal breath sounds.  Abdominal:     Palpations: Abdomen is soft.     Tenderness: There is no abdominal tenderness.  Musculoskeletal:        General: No swelling.     Cervical back: Neck supple.  Skin:    General: Skin is warm and dry.     Capillary Refill: Capillary refill takes less than 2 seconds.  Neurological:     Mental Status: She is alert.  Psychiatric:        Mood and Affect: Mood normal.     ED Results and Treatments Labs (all labs ordered are listed, but only abnormal results are displayed) Labs Reviewed  COMPREHENSIVE METABOLIC PANEL  CBC WITH DIFFERENTIAL/PLATELET  URINALYSIS, ROUTINE W REFLEX MICROSCOPIC                                                                                                                          Radiology No results found.  Pertinent labs & imaging results that were available during my care of the patient were reviewed by me and considered in my medical decision making (see MDM for details).  Medications Ordered in ED Medications - No data to display                                                                                                                                   Procedures Procedures  (including critical care time)  Medical Decision Making / ED Course   This patient presents to the ED for concern of fall, this involves an extensive number of treatment options, and is a complaint that carries with it a high risk of complications and morbidity.  The differential diagnosis includes closed head injury, skull fracture, intracranial hemorrhage, hematoma, electrolyte abnormality, dehydration  MDM: Patient seen emergency room for evaluation of a fall.  Physical exam with a right  forehead hematoma but is otherwise unremarkable.  Laboratory evaluation with hemoglobin of 9.4 which is baseline for the patient, BUN 31, creatinine 1.33 which is also near baseline for this patient.  Trauma imaging including CT head, C-spine, chest x-ray and pelvis x-ray negative for acute injury.  With negative work-up, patient safe for discharge back in  the care of her family who has PCP follow-up in 48 hours and is actively working on SNF placement from home.  Patient then discharged with outpatient follow-up   Additional history obtained: -Additional history obtained from multiple family members -External records from outside source obtained and reviewed including: Chart review including previous notes, labs, imaging, consultation notes   Lab Tests: -I ordered, reviewed, and interpreted labs.   The pertinent results include:   Labs Reviewed  COMPREHENSIVE METABOLIC PANEL  CBC WITH DIFFERENTIAL/PLATELET  URINALYSIS, ROUTINE W REFLEX MICROSCOPIC     Imaging Studies ordered: I ordered imaging studies including CT head, C-spine, chest x-ray, pelvis x-ray I independently visualized and interpreted imaging. I agree with the radiologist interpretation   Medicines ordered and prescription drug management: No orders of the defined types were placed in this encounter.   -I have reviewed the patients home medicines and have made adjustments as needed  Critical interventions none   Cardiac Monitoring: The patient was maintained on a cardiac monitor.  I personally viewed and interpreted the cardiac monitored which showed an underlying rhythm of: NSR  Social Determinants of Health:  Factors impacting patients care include: Currently lives at home, family actively seeking SNF placement from home   Reevaluation: After the interventions noted above, I reevaluated the patient and found that they have :improved  Co morbidities that complicate the patient evaluation  Past Medical  History:  Diagnosis Date   Anemia, unspecified    Anxiety    Arthritis    Asthma    Delusions ([place])    Depression    Edema 2005   "since put the glass in them"    GERD (gastroesophageal reflux disease)    Lumbago    Lymphedema    Obstructive chronic bronchitis without exacerbation    Other abnormal glucose    Psychotic paranoia ([place]) 04/02/1582   Systolic heart failure ([place])    EF 35 to 40% per echo January 2015   Unspecified essential hypertension    Unspecified nonpsychotic mental disorder    Unspecified venous (peripheral) insufficiency    Unspecified venous (peripheral) insufficiency    Unspecified vitamin D deficiency       Dispostion: I considered admission for this patient, but she does not meet inpatient criteria for admission she is safe for discharge outpatient follow-up     Final Clinical Impression(s) / ED Diagnoses Final diagnoses:  None     '@PCDICTATION'$ @    Teressa Lower, MD 09/13/22 1958

## 2022-09-13 NOTE — ED Triage Notes (Signed)
Ems brings pt in from home for a fall. States pt tripped and fell to floor. Pt denies pain. No blood thinners.

## 2022-09-15 ENCOUNTER — Telehealth: Payer: Self-pay

## 2022-09-15 ENCOUNTER — Ambulatory Visit (INDEPENDENT_AMBULATORY_CARE_PROVIDER_SITE_OTHER): Payer: Medicare Other | Admitting: Nurse Practitioner

## 2022-09-15 ENCOUNTER — Encounter: Payer: Self-pay | Admitting: Nurse Practitioner

## 2022-09-15 VITALS — BP 126/78 | HR 79 | Temp 97.3°F | Ht 59.0 in | Wt 143.0 lb

## 2022-09-15 DIAGNOSIS — R5381 Other malaise: Secondary | ICD-10-CM

## 2022-09-15 DIAGNOSIS — Z23 Encounter for immunization: Secondary | ICD-10-CM

## 2022-09-15 DIAGNOSIS — S0083XD Contusion of other part of head, subsequent encounter: Secondary | ICD-10-CM | POA: Diagnosis not present

## 2022-09-15 NOTE — Telephone Encounter (Signed)
Transition Care Management Unsuccessful Follow-up Telephone Call  Date of discharge and from where:  [facility], 09/13/2022  Attempts:  1st Attempt  Reason for unsuccessful TCM follow-up call:  Unable to reach patient patient had appointment 09/15/2022

## 2022-09-15 NOTE — Progress Notes (Signed)
Careteam: Patient Care Team: Sharon Seller, NP as PCP - General (Geriatric Medicine) Michele Mcalpine, MD (Pulmonary Disease) Othella Boyer, MD as Consulting Physician (Cardiology)  PLACE OF SERVICE:  [facility]  Advanced Directive information Does Patient Have a Medical Advance Directive?: Yes, Type of Advance Directive: Healthcare Power of Attorney, Does patient want to make changes to medical advance directive?: No - Patient declined  Allergies  Allergen Reactions   Aspirin Other (See Comments)    REACTION: upset stomach   Codeine Other (See Comments)    REACTION: "MAKES MY BODY GO CRAZY; CRAMPS"   Latex Nausea And Vomiting    Chief Complaint  Patient presents with   Acute Visit    Possible UTI      HPI: Patient is a 88 y.o. female for follow up ED. Son reports pt had an episode of confusion and fall and went to the ED. They were unable to obtain a urine and he wanted to follow up.  She has no dysuria, pain, fever, abdomina pain, frequency or flow.  Mental status is back to baseline.  She had hematoma due to fall, hears is healing. No headache or blurred vision noted.   Review of Systems:  Review of Systems  Constitutional:  Negative for chills, fever and weight loss.  HENT:  Negative for tinnitus.   Respiratory:  Negative for cough, sputum production and shortness of breath.   Cardiovascular:  Negative for chest pain, palpitations and leg swelling.  Gastrointestinal:  Negative for abdominal pain, constipation, diarrhea and heartburn.  Genitourinary:  Negative for dysuria, frequency and urgency.  Musculoskeletal:  Negative for back pain, falls, joint pain and myalgias.  Skin: Negative.   Neurological:  Negative for dizziness and headaches.  Psychiatric/Behavioral:  Negative for depression and memory loss. The patient does not have insomnia.     Past Medical History:  Diagnosis Date   Anemia, unspecified    Anxiety    Arthritis    Asthma     Delusions (HCC)    Depression    Edema 2005   "since put the glass in them"    GERD (gastroesophageal reflux disease)    Lumbago    Lymphedema    Obstructive chronic bronchitis without exacerbation    Other abnormal glucose    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   Systolic heart failure (HCC)    EF 35 to 40% per echo January 2015   Unspecified essential hypertension    Unspecified nonpsychotic mental disorder    Unspecified venous (peripheral) insufficiency    Unspecified venous (peripheral) insufficiency    Unspecified vitamin D deficiency    Past Surgical History:  Procedure Laterality Date   APPENDECTOMY     CERVICAL SPINE SURGERY     x 2   CHOLECYSTECTOMY     ESOPHAGOGASTRODUODENOSCOPY N/A 04/05/2015   Procedure: ESOPHAGOGASTRODUODENOSCOPY (EGD);  Surgeon: Meryl Dare, MD;  Location: [facility];  Service: Endoscopy;  Laterality: N/A;   FLEXIBLE SIGMOIDOSCOPY N/A 04/05/2015   Procedure: FLEXIBLE SIGMOIDOSCOPY;  Surgeon: Meryl Dare, MD;  Location: [facility];  Service: Endoscopy;  Laterality: N/A;   HAND SURGERY     LUMBAR DISC SURGERY     PARTIAL HYSTERECTOMY     TONSILLECTOMY     Social History:   reports that she has never smoked. She has never used smokeless tobacco. She reports that she does not drink alcohol and does not use drugs.  Family History  Problem Relation Age of Onset  Diabetes Mother    Arthritis Father    Diabetes Paternal Grandfather        entire family on both sides   Heart disease Maternal Aunt        entire family of both sides   Cancer Paternal Aunt        type unknown   Diabetes Sister    Breast cancer Cousin     Medications: Patient's Medications  New Prescriptions   No medications on file  Previous Medications   ACETAMINOPHEN (TYLENOL) 650 MG CR TABLET    Take 650 mg by mouth as needed for pain.   ALBUTEROL (PROAIR HFA) 108 (90 BASE) MCG/ACT INHALER    INHALE 2 PUFFS EVERY 4 HOURS AS NEEDED FOR WHEEZING   CARVEDILOL (COREG)  6.25 MG TABLET    TAKE 1 TABLET BY MOUTH TWICE DAILY WITH LUNCH AND WITH SUPPER   CETIRIZINE (ZYRTEC) 10 MG TABLET    TAKE 1 TABLET BY MOUTH AS NEEDED FOR ALLERGIES   DENOSUMAB (PROLIA) 60 MG/ML SOSY INJECTION    Inject 60 mg into the skin every 6 (six) months.   FEROSUL 325 (65 FE) MG TABLET    TAKE 1 TABLET(325 MG) BY MOUTH DAILY WITH BREAKFAST   FLUTICASONE-SALMETEROL (ADVAIR HFA) 115-21 MCG/ACT INHALER    INHALE 2 PUFFS BY MOUTH TWICE DAILY   HYDRALAZINE (APRESOLINE) 50 MG TABLET    TAKE 1 TABLET BY MOUTH TWICE DAILY AT 8 AM AND 10 PM   OLANZAPINE (ZYPREXA) 5 MG TABLET    TAKE 1 TABLET(5 MG) BY MOUTH AT BEDTIME   OMEPRAZOLE (PRILOSEC) 20 MG CAPSULE    TAKE 1 CAPSULE(20 MG) BY MOUTH DAILY   POTASSIUM CHLORIDE SA (KLOR-CON M) 20 MEQ TABLET    TAKE 2 TABLETS(40 MEQ) BY MOUTH TWICE DAILY   TORSEMIDE (DEMADEX) 20 MG TABLET    TAKE 1/2 TABLET(10 MG) BY MOUTH DAILY  Modified Medications   No medications on file  Discontinued Medications   No medications on file    Physical Exam:  Vitals:   09/15/22 1322  BP: 126/78  Pulse: 79  Temp: (!) 97.3 F (36.3 C)  TempSrc: Temporal  SpO2: 99%  Weight: 143 lb (64.9 kg)  Height: 4\' 11"  (1.499 m)   Body mass index is 28.88 kg/m. Wt Readings from Last 3 Encounters:  09/15/22 143 lb (64.9 kg)  05/19/22 141 lb (64 kg)  01/27/22 134 lb (60.8 kg)    Physical Exam Constitutional:      General: She is not in acute distress.    Appearance: She is well-developed. She is not diaphoretic.  HENT:     Head: Normocephalic and atraumatic.     Mouth/Throat:     Pharynx: No oropharyngeal exudate.  Eyes:     Conjunctiva/sclera: Conjunctivae normal.     Pupils: Pupils are equal, round, and reactive to light.  Cardiovascular:     Rate and Rhythm: Normal rate and regular rhythm.     Heart sounds: Normal heart sounds.  Pulmonary:     Effort: Pulmonary effort is normal.     Breath sounds: Normal breath sounds.  Abdominal:     General: Bowel sounds  are normal.     Palpations: Abdomen is soft.  Musculoskeletal:     Cervical back: Normal range of motion and neck supple.     Right lower leg: Edema present.     Left lower leg: Edema present.  Skin:    General: Skin is warm and dry.  Neurological:     Mental Status: She is alert. Mental status is at baseline.     Gait: Gait abnormal.  Psychiatric:        Mood and Affect: Mood normal.     Labs reviewed: Basic Metabolic Panel: Recent Labs    01/27/22 1555 05/19/22 1541 09/13/22 1633  NA 141 142 140  K 4.0 4.4 5.0  CL 104 111* 110  CO2 27 24 25   GLUCOSE 89 105 102*  BUN 31* 23 31*  CREATININE 1.36* 1.40* 1.33*  CALCIUM 10.0 9.4 9.3   Liver Function Tests: Recent Labs    01/27/22 1555 05/19/22 1541 09/13/22 1633  AST 11 12 20   ALT 7 7 16   ALKPHOS  --   --  71  BILITOT 0.3 0.3 0.9  PROT 7.8 7.2 8.2*  ALBUMIN  --   --  3.1*   No results for input(s): "LIPASE", "AMYLASE" in the last 8760 hours. No results for input(s): "AMMONIA" in the last 8760 hours. CBC: Recent Labs    01/27/22 1555 05/19/22 1541 09/13/22 1633  WBC 5.4 5.3 7.2  NEUTROABS 3,602 3,805 5.7  HGB 11.1* 10.6* 9.4*  HCT 35.5 33.5* 31.1*  MCV 87.4 88.2 89.6  PLT 245 213 247   Lipid Panel: No results for input(s): "CHOL", "HDL", "LDLCALC", "TRIG", "CHOLHDL", "LDLDIRECT" in the last 8760 hours. TSH: No results for input(s): "TSH" in the last 8760 hours. A1C: Lab Results  Component Value Date   HGBA1C 5.5 04/14/2014     Assessment/Plan 1. Need for influenza vaccination - Flu Vaccine QUAD High Dose(Fluad)  2. Contusion of face, subsequent encounter -overall improving, no worsening or progression of symptoms.  3. Debility Progressive worsening of mobility and worsening debility, fall precautions and increase in supervision. -no sign of UTI.   Janene Harvey. Biagio Borg  [facility] [phone]

## 2022-09-25 ENCOUNTER — Ambulatory Visit: Payer: Medicare Other | Admitting: Occupational Therapy

## 2022-10-04 ENCOUNTER — Telehealth: Payer: Self-pay

## 2022-10-04 NOTE — Telephone Encounter (Signed)
Patients nephew called stating he will fax [place] some forms from [place] at [facility], as he is highly considering placement for his aunt.   Once we receive forms, review, and complete [name] would like for [place] to fax them to [place] at [employer] and call to inform him that his request was fulfilled.  [name] is aware that Sherrie Mustache, NP next day in office is Friday 10/06/2022 and is ok with the expected turn around time, once forms received.

## 2022-10-16 ENCOUNTER — Ambulatory Visit: Payer: Medicare Other | Admitting: Occupational Therapy

## 2022-10-17 NOTE — Telephone Encounter (Signed)
[place] [employer] manager for [facility] was on Clinical Intake desk.  Filled out FL2 Form and placed paperwork in [place] folder to review and sign.   To be faxed to [facility] [phone] Fax: [phone] once completed.   Bambi Anderson Cel: [phone]

## 2022-10-18 ENCOUNTER — Other Ambulatory Visit: Payer: Self-pay

## 2022-10-18 DIAGNOSIS — Z111 Encounter for screening for respiratory tuberculosis: Secondary | ICD-10-CM

## 2022-10-18 NOTE — Telephone Encounter (Addendum)
Janett Billow gave me form back to call facility concerning TB testing and family about food allergies. I called the facility and left a message for Elita Boone [phone] to return my call regarding TB testing. I also called and left message for patient's nephew (legal guardian), [name] to return call regarding food allergies.

## 2022-10-19 ENCOUNTER — Emergency Department (HOSPITAL_COMMUNITY): Payer: Medicare Other

## 2022-10-19 ENCOUNTER — Other Ambulatory Visit: Payer: Self-pay

## 2022-10-19 ENCOUNTER — Inpatient Hospital Stay (HOSPITAL_COMMUNITY)
Admission: EM | Admit: 2022-10-19 | Discharge: 2022-10-25 | DRG: 193 | Disposition: A | Discharge status: Skilled Nursing Facility | Payer: Medicare Other | Attending: Internal Medicine | Admitting: Internal Medicine

## 2022-10-19 ENCOUNTER — Encounter (HOSPITAL_COMMUNITY): Payer: Self-pay

## 2022-10-19 DIAGNOSIS — I5033 Acute on chronic diastolic (congestive) heart failure: Secondary | ICD-10-CM | POA: Diagnosis not present

## 2022-10-19 DIAGNOSIS — R069 Unspecified abnormalities of breathing: Secondary | ICD-10-CM | POA: Diagnosis not present

## 2022-10-19 DIAGNOSIS — I130 Hypertensive heart and chronic kidney disease with heart failure and stage 1 through stage 4 chronic kidney disease, or unspecified chronic kidney disease: Secondary | ICD-10-CM | POA: Diagnosis present

## 2022-10-19 DIAGNOSIS — R4182 Altered mental status, unspecified: Secondary | ICD-10-CM | POA: Diagnosis not present

## 2022-10-19 DIAGNOSIS — N179 Acute kidney failure, unspecified: Secondary | ICD-10-CM | POA: Diagnosis present

## 2022-10-19 DIAGNOSIS — R293 Abnormal posture: Secondary | ICD-10-CM | POA: Diagnosis not present

## 2022-10-19 DIAGNOSIS — Z79899 Other long term (current) drug therapy: Secondary | ICD-10-CM

## 2022-10-19 DIAGNOSIS — R41841 Cognitive communication deficit: Secondary | ICD-10-CM | POA: Diagnosis not present

## 2022-10-19 DIAGNOSIS — Z8249 Family history of ischemic heart disease and other diseases of the circulatory system: Secondary | ICD-10-CM | POA: Diagnosis not present

## 2022-10-19 DIAGNOSIS — J168 Pneumonia due to other specified infectious organisms: Secondary | ICD-10-CM | POA: Diagnosis not present

## 2022-10-19 DIAGNOSIS — D631 Anemia in chronic kidney disease: Secondary | ICD-10-CM | POA: Diagnosis present

## 2022-10-19 DIAGNOSIS — Z91040 Latex allergy status: Secondary | ICD-10-CM | POA: Diagnosis not present

## 2022-10-19 DIAGNOSIS — Z833 Family history of diabetes mellitus: Secondary | ICD-10-CM | POA: Diagnosis not present

## 2022-10-19 DIAGNOSIS — F03918 Unspecified dementia, unspecified severity, with other behavioral disturbance: Secondary | ICD-10-CM | POA: Diagnosis present

## 2022-10-19 DIAGNOSIS — R062 Wheezing: Secondary | ICD-10-CM | POA: Diagnosis not present

## 2022-10-19 DIAGNOSIS — J189 Pneumonia, unspecified organism: Secondary | ICD-10-CM | POA: Diagnosis not present

## 2022-10-19 DIAGNOSIS — D649 Anemia, unspecified: Secondary | ICD-10-CM | POA: Diagnosis present

## 2022-10-19 DIAGNOSIS — I872 Venous insufficiency (chronic) (peripheral): Secondary | ICD-10-CM | POA: Diagnosis present

## 2022-10-19 DIAGNOSIS — I10 Essential (primary) hypertension: Secondary | ICD-10-CM | POA: Diagnosis not present

## 2022-10-19 DIAGNOSIS — I5030 Unspecified diastolic (congestive) heart failure: Secondary | ICD-10-CM | POA: Diagnosis not present

## 2022-10-19 DIAGNOSIS — M6281 Muscle weakness (generalized): Secondary | ICD-10-CM | POA: Diagnosis not present

## 2022-10-19 DIAGNOSIS — Z803 Family history of malignant neoplasm of breast: Secondary | ICD-10-CM | POA: Diagnosis not present

## 2022-10-19 DIAGNOSIS — N183 Chronic kidney disease, stage 3 unspecified: Secondary | ICD-10-CM | POA: Diagnosis not present

## 2022-10-19 DIAGNOSIS — J440 Chronic obstructive pulmonary disease with acute lower respiratory infection: Secondary | ICD-10-CM | POA: Diagnosis present

## 2022-10-19 DIAGNOSIS — K219 Gastro-esophageal reflux disease without esophagitis: Secondary | ICD-10-CM | POA: Diagnosis present

## 2022-10-19 DIAGNOSIS — Z885 Allergy status to narcotic agent status: Secondary | ICD-10-CM | POA: Diagnosis not present

## 2022-10-19 DIAGNOSIS — F03911 Unspecified dementia, unspecified severity, with agitation: Secondary | ICD-10-CM | POA: Diagnosis not present

## 2022-10-19 DIAGNOSIS — R0602 Shortness of breath: Secondary | ICD-10-CM

## 2022-10-19 DIAGNOSIS — D472 Monoclonal gammopathy: Secondary | ICD-10-CM | POA: Diagnosis present

## 2022-10-19 DIAGNOSIS — G4489 Other headache syndrome: Secondary | ICD-10-CM | POA: Diagnosis not present

## 2022-10-19 DIAGNOSIS — R1312 Dysphagia, oropharyngeal phase: Secondary | ICD-10-CM | POA: Diagnosis not present

## 2022-10-19 DIAGNOSIS — Z7401 Bed confinement status: Secondary | ICD-10-CM | POA: Diagnosis not present

## 2022-10-19 DIAGNOSIS — N1832 Chronic kidney disease, stage 3b: Secondary | ICD-10-CM | POA: Diagnosis present

## 2022-10-19 DIAGNOSIS — R609 Edema, unspecified: Secondary | ICD-10-CM | POA: Diagnosis not present

## 2022-10-19 DIAGNOSIS — R278 Other lack of coordination: Secondary | ICD-10-CM | POA: Diagnosis not present

## 2022-10-19 DIAGNOSIS — J811 Chronic pulmonary edema: Secondary | ICD-10-CM | POA: Diagnosis not present

## 2022-10-19 DIAGNOSIS — R079 Chest pain, unspecified: Secondary | ICD-10-CM | POA: Diagnosis not present

## 2022-10-19 DIAGNOSIS — Z111 Encounter for screening for respiratory tuberculosis: Secondary | ICD-10-CM

## 2022-10-19 DIAGNOSIS — I5043 Acute on chronic combined systolic (congestive) and diastolic (congestive) heart failure: Secondary | ICD-10-CM | POA: Diagnosis present

## 2022-10-19 DIAGNOSIS — G9341 Metabolic encephalopathy: Secondary | ICD-10-CM | POA: Diagnosis present

## 2022-10-19 DIAGNOSIS — Z886 Allergy status to analgesic agent status: Secondary | ICD-10-CM

## 2022-10-19 DIAGNOSIS — I5032 Chronic diastolic (congestive) heart failure: Secondary | ICD-10-CM | POA: Diagnosis not present

## 2022-10-19 DIAGNOSIS — Z8261 Family history of arthritis: Secondary | ICD-10-CM

## 2022-10-19 DIAGNOSIS — I491 Atrial premature depolarization: Secondary | ICD-10-CM | POA: Diagnosis not present

## 2022-10-19 DIAGNOSIS — Z1152 Encounter for screening for COVID-19: Secondary | ICD-10-CM | POA: Diagnosis not present

## 2022-10-19 DIAGNOSIS — Z7951 Long term (current) use of inhaled steroids: Secondary | ICD-10-CM

## 2022-10-19 DIAGNOSIS — R7989 Other specified abnormal findings of blood chemistry: Secondary | ICD-10-CM | POA: Diagnosis present

## 2022-10-19 DIAGNOSIS — J449 Chronic obstructive pulmonary disease, unspecified: Secondary | ICD-10-CM | POA: Diagnosis present

## 2022-10-19 DIAGNOSIS — I2489 Other forms of acute ischemic heart disease: Secondary | ICD-10-CM | POA: Diagnosis present

## 2022-10-19 DIAGNOSIS — R059 Cough, unspecified: Secondary | ICD-10-CM | POA: Diagnosis not present

## 2022-10-19 DIAGNOSIS — I272 Pulmonary hypertension, unspecified: Secondary | ICD-10-CM | POA: Diagnosis present

## 2022-10-19 DIAGNOSIS — J441 Chronic obstructive pulmonary disease with (acute) exacerbation: Secondary | ICD-10-CM | POA: Diagnosis present

## 2022-10-19 DIAGNOSIS — R296 Repeated falls: Secondary | ICD-10-CM | POA: Diagnosis not present

## 2022-10-19 LAB — RESP PANEL BY RT-PCR (RSV, FLU A&B, COVID)  RVPGX2
Influenza A by PCR: NEGATIVE
Influenza B by PCR: NEGATIVE
Resp Syncytial Virus by PCR: NEGATIVE
SARS Coronavirus 2 by RT PCR: NEGATIVE

## 2022-10-19 LAB — URINALYSIS, ROUTINE W REFLEX MICROSCOPIC
Bilirubin Urine: NEGATIVE
Glucose, UA: NEGATIVE mg/dL
Hgb urine dipstick: NEGATIVE
Ketones, ur: NEGATIVE mg/dL
Leukocytes,Ua: NEGATIVE
Nitrite: NEGATIVE
Protein, ur: 100 mg/dL — AB
Specific Gravity, Urine: 1.013 (ref 1.005–1.030)
pH: 5 (ref 5.0–8.0)

## 2022-10-19 LAB — COMPREHENSIVE METABOLIC PANEL
ALT: 14 U/L (ref 0–44)
AST: 20 U/L (ref 15–41)
Albumin: 2.8 g/dL — ABNORMAL LOW (ref 3.5–5.0)
Alkaline Phosphatase: 64 U/L (ref 38–126)
Anion gap: 9 (ref 5–15)
BUN: 22 mg/dL (ref 8–23)
CO2: 21 mmol/L — ABNORMAL LOW (ref 22–32)
Calcium: 9.2 mg/dL (ref 8.9–10.3)
Chloride: 114 mmol/L — ABNORMAL HIGH (ref 98–111)
Creatinine, Ser: 1.32 mg/dL — ABNORMAL HIGH (ref 0.44–1.00)
GFR, Estimated: 39 mL/min — ABNORMAL LOW (ref >60)
Glucose, Bld: 73 mg/dL (ref 70–99)
Potassium: 3.9 mmol/L (ref 3.5–5.1)
Sodium: 144 mmol/L (ref 135–145)
Total Bilirubin: 0.7 mg/dL (ref 0.3–1.2)
Total Protein: 7.7 g/dL (ref 6.5–8.1)

## 2022-10-19 LAB — EXPECTORATED SPUTUM ASSESSMENT W GRAM STAIN, RFLX TO RESP C

## 2022-10-19 LAB — CBC WITH DIFFERENTIAL/PLATELET
Abs Immature Granulocytes: 0.03 10*3/uL (ref 0.00–0.07)
Basophils Absolute: 0 10*3/uL (ref 0.0–0.1)
Basophils Relative: 1 %
Eosinophils Absolute: 0 10*3/uL (ref 0.0–0.5)
Eosinophils Relative: 0 %
HCT: 28.7 % — ABNORMAL LOW (ref 36.0–46.0)
Hemoglobin: 8.5 g/dL — ABNORMAL LOW (ref 12.0–15.0)
Immature Granulocytes: 1 %
Lymphocytes Relative: 16 %
Lymphs Abs: 1 10*3/uL (ref 0.7–4.0)
MCH: 26.3 pg (ref 26.0–34.0)
MCHC: 29.6 g/dL — ABNORMAL LOW (ref 30.0–36.0)
MCV: 88.9 fL (ref 80.0–100.0)
Monocytes Absolute: 0.7 10*3/uL (ref 0.1–1.0)
Monocytes Relative: 10 %
Neutro Abs: 4.8 10*3/uL (ref 1.7–7.7)
Neutrophils Relative %: 72 %
Platelets: 249 10*3/uL (ref 150–400)
RBC: 3.23 MIL/uL — ABNORMAL LOW (ref 3.87–5.11)
RDW: 14.5 % (ref 11.5–15.5)
WBC: 6.6 10*3/uL (ref 4.0–10.5)
nRBC: 0 % (ref 0.0–0.2)

## 2022-10-19 LAB — RETICULOCYTES
Immature Retic Fract: 23.5 % — ABNORMAL HIGH (ref 2.3–15.9)
RBC.: 3.15 MIL/uL — ABNORMAL LOW (ref 3.87–5.11)
Retic Count, Absolute: 58.3 10*3/uL (ref 19.0–186.0)
Retic Ct Pct: 1.9 % (ref 0.4–3.1)

## 2022-10-19 LAB — TROPONIN I (HIGH SENSITIVITY)
Troponin I (High Sensitivity): 141 ng/L (ref <18)
Troponin I (High Sensitivity): 140 ng/L (ref <18)

## 2022-10-19 LAB — BRAIN NATRIURETIC PEPTIDE: B Natriuretic Peptide: 1976.4 pg/mL — ABNORMAL HIGH (ref 0.0–100.0)

## 2022-10-19 LAB — LACTIC ACID, PLASMA
Lactic Acid, Venous: 1.5 mmol/L (ref 0.5–1.9)
Lactic Acid, Venous: 1.5 mmol/L (ref 0.5–1.9)

## 2022-10-19 MED ORDER — SODIUM CHLORIDE 0.9 % IV SOLN
2.0000 g | INTRAVENOUS | Status: AC
  Administered 2022-10-20 – 2022-10-24 (×5): 2 g via INTRAVENOUS
  Filled 2022-10-19 (×5): qty 20

## 2022-10-19 MED ORDER — METHYLPREDNISOLONE SODIUM SUCC 125 MG IJ SOLR
125.0000 mg | Freq: Once | INTRAMUSCULAR | Status: AC
  Administered 2022-10-19: 125 mg via INTRAVENOUS
  Filled 2022-10-19: qty 2

## 2022-10-19 MED ORDER — SODIUM CHLORIDE 0.9 % IV SOLN
500.0000 mg | INTRAVENOUS | Status: DC
  Administered 2022-10-20: 500 mg via INTRAVENOUS
  Filled 2022-10-19: qty 5

## 2022-10-19 MED ORDER — SODIUM CHLORIDE 0.9 % IV SOLN
1.0000 g | Freq: Once | INTRAVENOUS | Status: AC
  Administered 2022-10-19: 1 g via INTRAVENOUS
  Filled 2022-10-19: qty 10

## 2022-10-19 MED ORDER — SODIUM CHLORIDE 0.9 % IV SOLN
500.0000 mg | Freq: Once | INTRAVENOUS | Status: AC
  Administered 2022-10-20: 500 mg via INTRAVENOUS
  Filled 2022-10-19: qty 5

## 2022-10-19 NOTE — Assessment & Plan Note (Signed)
-  chronic avoid nephrotoxic medications such as NSAIDs, Vanco Zosyn combo,  avoid hypotension, continue to follow renal function  

## 2022-10-19 NOTE — Telephone Encounter (Signed)
Family dropped off the [facility] form and stated that the facility needs the [facility] FL2 Form filled out instead.   Filled out new FL2 Form and placed in [place] folder to review and sign. To be faxed to [facility] once completed Fax:[phone]

## 2022-10-19 NOTE — Assessment & Plan Note (Signed)
Chronic-stable.

## 2022-10-19 NOTE — Assessment & Plan Note (Signed)
Patient has chronic leg edema Which could also be secondary to hypoalbuminemia.  Gently diurese obtain echogram troponin mildly elevated 140 but stable denies any chest pain

## 2022-10-19 NOTE — H&P (Signed)
[name] XBM:[id number] DOB: Mar 30, 1932 DOA: 10/19/2022     PCP: Lauree Chandler, NP     [facility] Daneen Schick Patient arrived to ER on 10/19/22 at 2005 Referred by Attending Tegeler, Gwenyth Allegra, *   Patient coming from:    home Lives alone with 24h care    Chief Complaint:   Chief Complaint  Patient presents with   Altered Mental Status    HPI: [name] is a 88 y.o. female with medical history significant of Dementia hypertension, GERD, CHF, asthma, MGUS, obstructive chronic bronchitis, CKD, previous appendectomy, and cholecystectomy     Presented with wheezing and confusion Patient has dementia at baseline but has been declining recently no pain patient was noted to have some wheezing and rales and started on albuterol by EMS   Never smoked nor drink EtoH  no sick contacts  Family reports phlegm  No fever at home Reports body aches She had a fall in November had a fall not since   Initial COVID TEST  NEGATIVE    Lab Results  Component Value Date   [place] 10/19/2022     Regarding pertinent Chronic problems:       HTN on Coreg, hydralazine   chronic CHF diastolic - last echo 0272 EF 55% Grade II  diastolic dysfunction       preDM 2 -  Lab Results  Component Value Date   HGBA1C 5.5 04/14/2014    diet controlled       Asthma -well   controlled on home inhalers/ nebs f            CKD stage IIIa- baseline Cr 1.3 Estimated Creatinine Clearance: 23.4 mL/min (A) (by C-G formula based on SCr of 1.32 mg/dL (H)).  Lab Results  Component Value Date   CREATININE 1.32 (H) 10/19/2022   CREATININE 1.33 (H) 09/13/2022   CREATININE 1.40 (H) 05/19/2022      Dementia - on Zyprexa   Chronic anemia - baseline hg Hemoglobin & Hematocrit  Recent Labs    05/19/22 1541 09/13/22 1633 10/19/22 2051  HGB 10.6* 9.4* 8.5*     While in ER:   Noted to be initially hypertensive in 190s Chest x-ray  worrisome for pneumonia Noted does have progressive anemia Given wheezing also was given a dose of Solu-Medrol and started on Rocephin azithromycin Ordered  CT HEAD motion degraded but nonacute  CXR - suspicious for pneumonia.     Following Medications were ordered in ER: Medications  cefTRIAXone (ROCEPHIN) 1 g in sodium chloride 0.9 % 100 mL IVPB (1 g Intravenous New Bag/Given 10/19/22 2314)  azithromycin (ZITHROMAX) 500 mg in sodium chloride 0.9 % 250 mL IVPB (has no administration in time range)  methylPREDNISolone sodium succinate (SOLU-MEDROL) 125 mg/2 mL injection 125 mg (125 mg Intravenous Given 10/19/22 2232)    ___________    ED Triage Vitals  Enc Vitals Group     BP 10/19/22 2015 (!) 196/60     Pulse Rate 10/19/22 2015 64     Resp 10/19/22 2023 17     Temp 10/19/22 2023 99.7 F (37.6 C)     Temp Source 10/19/22 2023 Oral     SpO2 10/19/22 2015 98 %     Weight 10/19/22 2224 135 lb (61.2 kg)     Height 10/19/22 2224 '4\' 11"'$  (1.499 m)     Head Circumference --      Peak Flow --  Pain Score 10/19/22 2009 0     Pain Loc --      Pain Edu? --      Excl. in [place]? --   TMAX(24)@     _________________________________________ Significant initial  Findings: Abnormal Labs Reviewed  CBC WITH DIFFERENTIAL/PLATELET - Abnormal; Notable for the following components:      Result Value   RBC 3.23 (*)    Hemoglobin 8.5 (*)    HCT 28.7 (*)    MCHC 29.6 (*)    All other components within normal limits  COMPREHENSIVE METABOLIC PANEL - Abnormal; Notable for the following components:   Chloride 114 (*)    CO2 21 (*)    Creatinine, Ser 1.32 (*)    Albumin 2.8 (*)    GFR, Estimated 39 (*)    All other components within normal limits  BRAIN NATRIURETIC PEPTIDE - Abnormal; Notable for the following components:   B Natriuretic Peptide 1,976.4 (*)    All other components within normal limits  URINALYSIS, ROUTINE W REFLEX MICROSCOPIC - Abnormal; Notable for the following components:    Protein, ur 100 (*)    Bacteria, UA RARE (*)    All other components within normal limits  TROPONIN I (HIGH SENSITIVITY) - Abnormal; Notable for the following components:   Troponin I (High Sensitivity) 141 (*)    All other components within normal limits   ______ Troponin 140 - 141 ECG: Ordered Personally reviewed and interpreted by me showing: HR : 67 Rhythm:  NSR, no evidence of ischemic changes QTC 464   The recent clinical data is shown below. Vitals:   10/19/22 2200 10/19/22 2215 10/19/22 2224 10/19/22 2230  BP: (!) 189/82 (!) 197/148  (!) 181/132  Pulse: 67 71  76  Resp: (!) 30 14  (!) 28  Temp: 100.1 F (37.8 C)     TempSrc: Rectal     SpO2: 96% 98%  98%  Weight:   61.2 kg   Height:   '4\' 11"'$  (1.499 m)     WBC     Component Value Date/Time   WBC 6.6 10/19/2022 2051   LYMPHSABS 1.0 10/19/2022 2051   LYMPHSABS 1.0 04/22/2014 1024   MONOABS 0.7 10/19/2022 2051   MONOABS 0.4 04/22/2014 1024   EOSABS 0.0 10/19/2022 2051   EOSABS 0.2 04/22/2014 1024   BASOSABS 0.0 10/19/2022 2051   BASOSABS 0.0 04/22/2014 1024    Lactic Acid, Venous    Component Value Date/Time   LATICACIDVEN 1.5 10/19/2022 2217    Procalcitonin   Ordered      UA    no evidence of UTI      Urine analysis:    Component Value Date/Time   COLORURINE YELLOW 10/19/2022 2236   APPEARANCEUR CLEAR 10/19/2022 2236   LABSPEC 1.013 10/19/2022 2236   PHURINE 5.0 10/19/2022 2236   GLUCOSEU NEGATIVE 10/19/2022 2236   GLUCOSEU NEGATIVE 05/26/2011 1020   HGBUR NEGATIVE 10/19/2022 2236   BILIRUBINUR NEGATIVE 10/19/2022 2236   KETONESUR NEGATIVE 10/19/2022 2236   PROTEINUR 100 (A) 10/19/2022 2236   UROBILINOGEN 1.0 04/03/2015 1830   NITRITE NEGATIVE 10/19/2022 2236   LEUKOCYTESUR NEGATIVE 10/19/2022 2236    Results for orders placed or performed during the hospital encounter of 10/19/22  Resp panel by RT-PCR (RSV, Flu A&B, Covid) Anterior Nasal Swab     Status: None   Collection Time: 10/19/22   8:51 PM   Specimen: Anterior Nasal Swab  Result Value Ref Range Status   SARS Coronavirus 2  by RT PCR NEGATIVE NEGATIVE Final         Influenza A by PCR NEGATIVE NEGATIVE Final   Influenza B by PCR NEGATIVE NEGATIVE Final         Resp Syncytial Virus by PCR NEGATIVE NEGATIVE Final          _______________________________________________ Hospitalist was called for admission for   Pneumonia due to infectious organism COPD    The following Work up has been ordered so far:  Orders Placed This Encounter  Procedures   Blood culture (routine x 2)   Resp panel by RT-PCR (RSV, Flu A&B, Covid) Anterior Nasal Swab   Expectorated Sputum Assessment w Gram Stain, Rflx to Resp Cult   DG Chest Portable 1 View   CT HEAD WO CONTRAST (5MM)   CBC with Differential   Comprehensive metabolic panel   Lactic acid, plasma   Brain natriuretic peptide   Urinalysis, Routine w reflex microscopic   Check Rectal Temperature   Consult to hospitalist   ED EKG     OTHER Significant initial  Findings:  labs showing:  Recent Labs  Lab 10/19/22 2051 10/19/22 2236  NA 144  --   K 3.9  --   CO2 21*  --   GLUCOSE 73  --   BUN 22  --   CREATININE 1.32*  --   CALCIUM 9.2  --   MG  --  1.9  PHOS  --  3.6    Cr  stable,  Lab Results  Component Value Date   CREATININE 1.32 (H) 10/19/2022   CREATININE 1.33 (H) 09/13/2022   CREATININE 1.40 (H) 05/19/2022    Recent Labs  Lab 10/19/22 2051  AST 20  ALT 14  ALKPHOS 64  BILITOT 0.7  PROT 7.7  ALBUMIN 2.8*   Lab Results  Component Value Date   CALCIUM 9.2 10/19/2022    Plt: Lab Results  Component Value Date   PLT 249 10/19/2022     Recent Labs  Lab 10/19/22 2051  WBC 6.6  NEUTROABS 4.8  HGB 8.5*  HCT 28.7*  MCV 88.9  PLT 249    HG/HCT   Down   from baseline see below    Component Value Date/Time   HGB 8.5 (L) 10/19/2022 2051   HGB 9.1 (L) 04/22/2014 1024   HCT 28.7 (L) 10/19/2022 2051   HCT 29.1 (L) 04/22/2014 1024   MCV  88.9 10/19/2022 2051   MCV 85.3 04/22/2014 1024     Cardiac Panel (last 3 results) Recent Labs    10/19/22 2236  CKTOTAL 250*    .car BNP (last 3 results) Recent Labs    10/19/22 2051  BNP 1,976.4*        Cultures:    Component Value Date/Time   SDES URINE, CATHETERIZED 04/03/2015 1830   SPECREQUEST Normal 04/03/2015 1830   CULT NO GROWTH Performed at [facility]  04/03/2015 1830   REPTSTATUS 04/04/2015 FINAL 04/03/2015 1830     Radiological Exams on Admission: CT HEAD WO CONTRAST (5MM)  Result Date: 10/19/2022 CLINICAL DATA:  Mental status change EXAM: CT HEAD WITHOUT CONTRAST TECHNIQUE: Contiguous axial images were obtained from the base of the skull through the vertex without intravenous contrast. RADIATION DOSE REDUCTION: This exam was performed according to the departmental dose-optimization program which includes automated exposure control, adjustment of the mA and/or kV according to patient size and/or use of iterative reconstruction technique. COMPARISON:  CT brain 09/13/2022 FINDINGS: Brain: There is motion degradation.  No acute territorial infarction, hemorrhage or intracranial mass. Atrophy and chronic small vessel ischemic changes of the white matter. Stable ventricle size. Vascular: No hyperdense vessels.  Carotid vascular calcification. Skull: Normal. Negative for fracture or focal lesion. Sinuses/Orbits: No acute finding. Other: None IMPRESSION: 1. Motion degraded exam. No definite CT evidence for acute intracranial abnormality. 2. Atrophy and chronic small vessel ischemic changes of the white matter. Electronically Signed   By: Donavan Foil M.D.   On: 10/19/2022 22:02   DG Chest Portable 1 View  Result Date: 10/19/2022 CLINICAL DATA:  Cough, shortness of breath, wheezing EXAM: PORTABLE CHEST 1 VIEW COMPARISON:  09/13/2022 FINDINGS: Mild retrocardiac opacity, suspicious for pneumonia. Cardiomegaly with pulmonary vascular congestion. No frank interstitial  edema. No pleural effusion or pneumothorax. IMPRESSION: Mild retrocardiac opacity, suspicious for pneumonia. Electronically Signed   By: Julian Hy M.D.   On: 10/19/2022 21:21   _______________________________________________________________________________________________________ Latest  Blood pressure (!) 181/132, pulse 76, temperature 100.1 F (37.8 C), temperature source Rectal, resp. rate (!) 28, height '4\' 11"'$  (1.499 m), weight 61.2 kg, SpO2 98 %.   Vitals  labs and radiology finding personally reviewed  Review of Systems:    Pertinent positives include:  fatigue,  confusion Constitutional:  No weight loss, night sweats, Fevers, chills,  weight loss  HEENT:  No headaches, Difficulty swallowing,Tooth/dental problems,Sore throat,  No sneezing, itching, ear ache, nasal congestion, post nasal drip,  Cardio-vascular:  No chest pain, Orthopnea, PND, anasarca, dizziness, palpitations.no Bilateral lower extremity swelling  GI:  No heartburn, indigestion, abdominal pain, nausea, vomiting, diarrhea, change in bowel habits, loss of appetite, melena, blood in stool, hematemesis Resp:  no shortness of breath at rest. No dyspnea on exertion, No excess mucus, no productive cough, No non-productive cough, No coughing up of blood.No change in color of mucus.No wheezing. Skin:  no rash or lesions. No jaundice GU:  no dysuria, change in color of urine, no urgency or frequency. No straining to urinate.  No flank pain.  Musculoskeletal:  No joint pain or no joint swelling. No decreased range of motion. No back pain.  Psych:  No change in mood or affect. No depression or anxiety. No memory loss.  Neuro: no localizing neurological complaints, no tingling, no weakness, no double vision, no gait abnormality, no slurred speech, no  All systems reviewed and apart from [place] all are negative _______________________________________________________________________________________________ Past Medical  History:   Past Medical History:  Diagnosis Date   Anemia, unspecified    Anxiety    Arthritis    Asthma    Delusions ([place])    Depression    Edema 2005   "since put the glass in them"    GERD (gastroesophageal reflux disease)    Lumbago    Lymphedema    Obstructive chronic bronchitis without exacerbation    Other abnormal glucose    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   Systolic heart failure ([place])    EF 35 to 40% per echo January 2015   Unspecified essential hypertension    Unspecified nonpsychotic mental disorder    Unspecified venous (peripheral) insufficiency    Unspecified venous (peripheral) insufficiency    Unspecified vitamin D deficiency       Past Surgical History:  Procedure Laterality Date   APPENDECTOMY     CERVICAL SPINE SURGERY     x 2   CHOLECYSTECTOMY     ESOPHAGOGASTRODUODENOSCOPY N/A 04/05/2015   Procedure: ESOPHAGOGASTRODUODENOSCOPY (EGD);  Surgeon: [name], MD;  Location: [facility];  Service:  Endoscopy;  Laterality: N/A;   FLEXIBLE SIGMOIDOSCOPY N/A 04/05/2015   Procedure: FLEXIBLE SIGMOIDOSCOPY;  Surgeon: [name], MD;  Location: [facility];  Service: Endoscopy;  Laterality: N/A;   HAND SURGERY     LUMBAR DISC SURGERY     PARTIAL HYSTERECTOMY     TONSILLECTOMY      Social History:  Ambulatory  cane,     reports that she has never smoked. She has never used smokeless tobacco. She reports that she does not drink alcohol and does not use drugs.    Family History:  Family History  Problem Relation Age of Onset   Diabetes Mother    Arthritis Father    Diabetes Paternal Grandfather        entire family on both sides   Heart disease Maternal Aunt        entire family of both sides   Cancer Paternal Aunt        type unknown   Diabetes Sister    Breast cancer Cousin    ______________________________________________________________________________________________ Allergies: Allergies  Allergen Reactions   Aspirin Other (See  Comments)    REACTION: upset stomach   Codeine Other (See Comments)    REACTION: "MAKES MY BODY GO CRAZY; CRAMPS"   Latex Nausea And Vomiting    Prior to Admission medications   Medication Sig Start Date End Date Taking? Authorizing Provider  acetaminophen (TYLENOL) 650 MG CR tablet Take 650 mg by mouth as needed for pain.    [provider]  albuterol (PROAIR HFA) 108 (90 Base) MCG/ACT inhaler INHALE 2 PUFFS EVERY 4 HOURS AS NEEDED FOR WHEEZING 01/27/22   Lauree Chandler, NP  carvedilol (COREG) 6.25 MG tablet TAKE 1 TABLET BY MOUTH TWICE DAILY WITH LUNCH AND WITH SUPPER 04/19/22   Lauree Chandler, NP  cetirizine (ZYRTEC) 10 MG tablet TAKE 1 TABLET BY MOUTH AS NEEDED FOR ALLERGIES 08/07/22   Lauree Chandler, NP  denosumab (PROLIA) 60 MG/ML SOSY injection Inject 60 mg into the skin every 6 (six) months.    [provider]  FEROSUL 325 (65 Fe) MG tablet TAKE 1 TABLET(325 MG) BY MOUTH DAILY WITH BREAKFAST 03/15/22   Lauree Chandler, NP  fluticasone-salmeterol (ADVAIR HFA) 115-21 MCG/ACT inhaler INHALE 2 PUFFS BY MOUTH TWICE DAILY 01/27/22   Lauree Chandler, NP  hydrALAZINE (APRESOLINE) 50 MG tablet TAKE 1 TABLET BY MOUTH TWICE DAILY AT 8 AM AND 10 PM 01/27/22   Lauree Chandler, NP  OLANZapine (ZYPREXA) 5 MG tablet TAKE 1 TABLET(5 MG) BY MOUTH AT BEDTIME 01/27/22   Lauree Chandler, NP  omeprazole (PRILOSEC) 20 MG capsule TAKE 1 CAPSULE(20 MG) BY MOUTH DAILY 11/01/21   Lauree Chandler, NP  potassium chloride SA (KLOR-CON M) 20 MEQ tablet TAKE 2 TABLETS(40 MEQ) BY MOUTH TWICE DAILY 07/24/22   Lauree Chandler, NP  torsemide (DEMADEX) 20 MG tablet TAKE 1/2 TABLET(10 MG) BY MOUTH DAILY 04/19/22   Lauree Chandler, NP    ___________________________________________________________________________________________________ Physical Exam:    10/19/2022   10:30 PM 10/19/2022   10:24 PM 10/19/2022   10:15 PM  Vitals with BMI  Height  '4\' 11"'$    Weight  135 lbs   BMI   59.93   Systolic 570  177  Diastolic 939  030  Pulse 76  71     1. General:  in No  Acute distress   Chronically ill  -appearing 2. Psychological: Alert and   Oriented 3. Head/ENT:  Dry Mucous Membranes                          Head Non traumatic, neck supple                           Poor Dentition 4. SKIN:  decreased Skin turgor,  Skin clean Dry and intact no rash 5. Heart: Regular rate and rhythm  Murmur, no Rub or gallop 6. Lung wheezes  crackles   7. Abdomen: Soft,  non-tender, Non distended  bowel sounds present 8. Lower extremities: no clubbing, cyanosis,  bilateral edema 9. Neurologically Grossly intact, moving all 4 extremities equally  10. MSK: Normal range of motion    Chart has been reviewed  ______________________________________________________________________________________________  Assessment/Plan  88 y.o. female with medical history significant of Dementia hypertension, GERD, CHF, asthma, MGUS, obstructive chronic bronchitis, CKD, previous appendectomy, and cholecystectomy     Admitted for   CAP, COPD exacerbation, fluid overload likely acute on chronic diastolic CHF in the setting of dementia  Present on Admission:  CAP (community acquired pneumonia)  Essential hypertension  MGUS (monoclonal gammopathy of unknown significance)  CKD (chronic kidney disease) stage 3, GFR 30-59 ml/min (HCC)  COPD with acute exacerbation (HCC)  Acute on chronic diastolic CHF (congestive heart failure) ([place])  Dementia with behavioral disturbance (HCC)  Normocytic anemia  Acute metabolic encephalopathy  Elevated troponin     CAP (community acquired pneumonia)  - -Patient presenting with  productive cough, , and infiltrate in   on chest x-ray -Infiltrate on CXR and 2-3 characteristics (fever, leukocytosis, purulent sputum) are consistent with pneumonia. -This appears to be most likely community-acquired pneumonia.       will admit for treatment of CAP will start on  appropriate antibiotic coverage. - Rocephin/azithromycin   Obtain:  sputum cultures,                  Obtain respiratory panel                    influenza serologies negative                  COVID PCR negative                     blood cultures and sputum cultures ordered                   strep pneumo UA antigen,                   check for Legionella antigen.                Provide oxygen as needed.    Essential hypertension Restart home medications    hydralazine 50 mg p.o. twice daily Given COPD hold off on coreg, hr in 60;s  MGUS (monoclonal gammopathy of unknown significance) Chronic stable  CKD (chronic kidney disease) stage 3, GFR 30-59 ml/min (HCC)  -chronic avoid nephrotoxic medications such as NSAIDs, Vanco Zosyn combo,  avoid hypotension, continue to follow renal function   COPD with acute exacerbation ([place])  -  - Will initiate: Steroid taper  -  Antibiotics Rocephin azithromycin - Albuterol  PRN, - scheduled duoneb,  -  Breo or Dulera at discharge   -  Mucinex.  Titrate O2 to saturation >90%. Follow patients respiratory status.   nfluenza PCR negative Ordered VBG  Currently mentating well no evidence of symptomatic hypercarbia   Acute on chronic diastolic CHF (congestive heart failure) ([place]) Patient has chronic leg edema Which could also be secondary to hypoalbuminemia.  Gently diurese obtain echogram troponin mildly elevated 140 but stable denies any chest pain  Dementia with behavioral disturbance (HCC) Monitor for sundowning while hospitalized  Normocytic anemia In the setting of MGUS will obtain anemia panel continue to follow transfuse as needed for hemoglobin below 7  Acute metabolic encephalopathy   - most likely multifactorial secondary to combination of   infection underlying dementia           - treat underlining infection   - Hold contributing medications      - if no improvement may need further imaging to evaluate for CNS pathology  pathology such as MRI of the brain   - neurological exam appears to be nonfocal but patient unable to cooperate fully   - VBG ordered- no history of liver disease ammonia ordered  Elevated troponin  -no chest pain no EKG changes in the setting of  increased work of breathing and  chronic kidney disease likely due to demand ischemia and poor clearance, monitor on telemetry and cycle cardiac enzymes to trend.  if continues to rise will need further work-up Echo in the morning. Given CHF could be contributing but also let cardiology know patient has been admitted   Other plan as per orders.  DVT prophylaxis:  SCD     Code Status:    Code Status: Prior FULL CODE as per  family  I had personally discussed CODE STATUS with patient and family   Family Communication:   Family  at  Bedside  plan of care was discussed  with nephew MPOA  Disposition Plan:      To home once workup is complete and patient is stable   Following barriers for discharge:                                                     Will need consultants to evaluate patient prior to discharge                     Would benefit from PT/OT eval prior to DC  Ordered                   Swallow eval - SLP ordered                                       Transition of care consulted                   Nutrition    consulted                                       Consults called:  Emailed cardiology  Admission status:  ED Disposition     ED Disposition  [place]: [facility] / [facility] [[id number]]  Level of Care: Telemetry [5]  Admit to tele based on following criteria: Other see comments  Admit to tele based on  following criteria: Acute CHF  Comments: elevated trop  May admit patient to [facility] or [facility] if equivalent level of care is available:: No  Covid Evaluation: Asymptomatic - no recent exposure (last 10 days) testing not required  Diagnosis: CAP (community acquired  pneumonia) [[id number]]  Admitting Physician: Toy Baker [[id number]]  Attending Physician: Toy Baker [[id number]]  Certification:: I certify this patient will need inpatient services for at least 2 midnights  Estimated Length of Stay: 2             inpatient     I Expect 2 midnight stay secondary to severity of patient's current illness need for inpatient interventions justified by the following:     Severe lab/radiological/exam abnormalities including:   Pneumonia and extensive comorbidities including:  CHF  COPD/asthma  CKD  Dementia   That are currently affecting medical management.   I expect  patient to be hospitalized for 2 midnights requiring inpatient medical care.  Patient is at high risk for adverse outcome (such as loss of life or disability) if not treated.  Indication for inpatient stay as follows:  Severe change from baseline regarding mental status    Need for IV antibiotics, IV fluids,      Level of care     tele  For  24H    Lab Results  Component Value Date   [place] 10/19/2022     Precautions: admitted as   Covid Negative       Leyah Bocchino 10/20/2022, 2:24 AM    [facility]     after 2 AM please page floor coverage PA If 7AM-7PM, please contact the day team taking care of the patient using [email or web address]   Patient was evaluated in the context of the global COVID-19 pandemic, which necessitated consideration that the patient might be at risk for infection with the SARS-CoV-2 virus that causes COVID-19. Institutional protocols and algorithms that pertain to the evaluation of patients at risk for COVID-19 are in a state of rapid change based on information released by regulatory bodies including the CDC and federal and state organizations. These policies and algorithms were followed during the patient's care.

## 2022-10-19 NOTE — ED Triage Notes (Signed)
From home, per family pt is altered. Pt does have dementia. However, they stated that they came home and she was not at her baseline and she has not been getting up as normal. Pt denies pain. She follows commands. Wheezing noted en route. Albuterol given with EMS. Hypertensive.

## 2022-10-19 NOTE — Assessment & Plan Note (Signed)
Monitor for sundowning while hospitalized

## 2022-10-19 NOTE — Assessment & Plan Note (Signed)
- -  Patient presenting with  productive cough, , and infiltrate in   on chest x-ray -Infiltrate on CXR and 2-3 characteristics (fever, leukocytosis, purulent sputum) are consistent with pneumonia. -This appears to be most likely community-acquired pneumonia.       will admit for treatment of CAP will start on appropriate antibiotic coverage. - Rocephin/azithromycin   Obtain:  sputum cultures,                  Obtain respiratory panel                    influenza serologies negative                  COVID PCR negative                     blood cultures and sputum cultures ordered                   strep pneumo UA antigen,                   check for Legionella antigen.                Provide oxygen as needed.

## 2022-10-19 NOTE — ED Provider Notes (Signed)
[facility] DEPT Provider Note   CSN: [id number] Arrival date & time: 10/19/22  2005     History  Chief Complaint  Patient presents with   Altered Mental Status    [name] is a 88 y.o. female.  The history is provided by the patient and medical records. No language interpreter was used.  Altered Mental Status Presenting symptoms: confusion (per EMS report but pt denies)   Presenting symptoms comment:  Fatigue per pt Severity:  Unable to specify Episode history:  Unable to specify Context: dementia   Associated symptoms: no abdominal pain, no agitation, no fever, no headaches, no nausea, no palpitations, no rash, no vomiting and no weakness   Associated symptoms comment:  Cough, chills, wheeze, SOB      Home Medications Prior to Admission medications   Medication Sig Start Date End Date Taking? Authorizing Provider  acetaminophen (TYLENOL) 650 MG CR tablet Take 650 mg by mouth as needed for pain.    [provider]  albuterol (PROAIR HFA) 108 (90 Base) MCG/ACT inhaler INHALE 2 PUFFS EVERY 4 HOURS AS NEEDED FOR WHEEZING 01/27/22   Lauree Chandler, NP  carvedilol (COREG) 6.25 MG tablet TAKE 1 TABLET BY MOUTH TWICE DAILY WITH LUNCH AND WITH SUPPER 04/19/22   Lauree Chandler, NP  cetirizine (ZYRTEC) 10 MG tablet TAKE 1 TABLET BY MOUTH AS NEEDED FOR ALLERGIES 08/07/22   Lauree Chandler, NP  denosumab (PROLIA) 60 MG/ML SOSY injection Inject 60 mg into the skin every 6 (six) months.    [provider]  FEROSUL 325 (65 Fe) MG tablet TAKE 1 TABLET(325 MG) BY MOUTH DAILY WITH BREAKFAST 03/15/22   Lauree Chandler, NP  fluticasone-salmeterol (ADVAIR HFA) 115-21 MCG/ACT inhaler INHALE 2 PUFFS BY MOUTH TWICE DAILY 01/27/22   Lauree Chandler, NP  hydrALAZINE (APRESOLINE) 50 MG tablet TAKE 1 TABLET BY MOUTH TWICE DAILY AT 8 AM AND 10 PM 01/27/22   Lauree Chandler, NP  OLANZapine (ZYPREXA) 5 MG tablet TAKE 1 TABLET(5 MG) BY  MOUTH AT BEDTIME 01/27/22   Lauree Chandler, NP  omeprazole (PRILOSEC) 20 MG capsule TAKE 1 CAPSULE(20 MG) BY MOUTH DAILY 11/01/21   Lauree Chandler, NP  potassium chloride SA (KLOR-CON M) 20 MEQ tablet TAKE 2 TABLETS(40 MEQ) BY MOUTH TWICE DAILY 07/24/22   Lauree Chandler, NP  torsemide (DEMADEX) 20 MG tablet TAKE 1/2 TABLET(10 MG) BY MOUTH DAILY 04/19/22   Lauree Chandler, NP      Allergies    Aspirin, Codeine, and Latex    Review of Systems   Review of Systems  Constitutional:  Positive for chills and fatigue. Negative for fever.  HENT:  Positive for congestion.   Respiratory:  Positive for cough, chest tightness, shortness of breath and wheezing.   Cardiovascular:  Positive for leg swelling (chronic). Negative for chest pain and palpitations.  Gastrointestinal:  Negative for abdominal pain, constipation, diarrhea, nausea and vomiting.  Genitourinary:  Negative for dysuria and frequency.  Musculoskeletal:  Negative for back pain, neck pain and neck stiffness.  Skin:  Negative for rash and wound.  Neurological:  Negative for weakness and headaches.  Psychiatric/Behavioral:  Positive for confusion (per EMS report but pt denies). Negative for agitation.   All other systems reviewed and are negative.   Physical Exam Updated Vital Signs BP (!) 196/60   Pulse 66   Temp 99.7 F (37.6 C) (Oral)   Resp 17   SpO2 97%  Physical Exam Vitals and nursing note reviewed.  Constitutional:      General: She is not in acute distress.    Appearance: She is well-developed. She is not ill-appearing, toxic-appearing or diaphoretic.  HENT:     Head: Normocephalic and atraumatic.     Nose: No congestion or rhinorrhea.     Mouth/Throat:     Mouth: Mucous membranes are dry.     Pharynx: No oropharyngeal exudate or posterior oropharyngeal erythema.  Eyes:     Conjunctiva/sclera: Conjunctivae normal.     Pupils: Pupils are equal, round, and reactive to light.  Cardiovascular:     Rate and  Rhythm: Normal rate and regular rhythm.     Heart sounds: No murmur heard. Pulmonary:     Effort: Pulmonary effort is normal. No respiratory distress.     Breath sounds: No stridor. Wheezing, rhonchi and rales present.  Chest:     Chest wall: No tenderness.  Abdominal:     Palpations: Abdomen is soft.     Tenderness: There is no abdominal tenderness. There is no right CVA tenderness, left CVA tenderness, guarding or rebound.  Musculoskeletal:        General: No swelling or tenderness.     Cervical back: Neck supple.     Right lower leg: Edema present.     Left lower leg: Edema present.  Skin:    General: Skin is warm and dry.     Capillary Refill: Capillary refill takes less than 2 seconds.     Findings: No erythema or rash.  Neurological:     General: No focal deficit present.     Mental Status: She is alert. Mental status is at baseline.  Psychiatric:        Mood and Affect: Mood normal.     ED Results / Procedures / Treatments   Labs (all labs ordered are listed, but only abnormal results are displayed) Labs Reviewed  CBC WITH DIFFERENTIAL/PLATELET - Abnormal; Notable for the following components:      Result Value   RBC 3.23 (*)    Hemoglobin 8.5 (*)    HCT 28.7 (*)    MCHC 29.6 (*)    All other components within normal limits  COMPREHENSIVE METABOLIC PANEL - Abnormal; Notable for the following components:   Chloride 114 (*)    CO2 21 (*)    Creatinine, Ser 1.32 (*)    Albumin 2.8 (*)    GFR, Estimated 39 (*)    All other components within normal limits  BRAIN NATRIURETIC PEPTIDE - Abnormal; Notable for the following components:   B Natriuretic Peptide 1,976.4 (*)    All other components within normal limits  URINALYSIS, ROUTINE W REFLEX MICROSCOPIC - Abnormal; Notable for the following components:   Protein, ur 100 (*)    Bacteria, UA RARE (*)    All other components within normal limits  RETICULOCYTES - Abnormal; Notable for the following components:   RBC.  3.15 (*)    Immature Retic Fract 23.5 (*)    All other components within normal limits  TROPONIN I (HIGH SENSITIVITY) - Abnormal; Notable for the following components:   Troponin I (High Sensitivity) 141 (*)    All other components within normal limits  TROPONIN I (HIGH SENSITIVITY) - Abnormal; Notable for the following components:   Troponin I (High Sensitivity) 140 (*)    All other components within normal limits  RESP PANEL BY RT-PCR (RSV, FLU A&B, COVID)  RVPGX2  EXPECTORATED SPUTUM ASSESSMENT  W GRAM STAIN, RFLX TO RESP C  CULTURE, BLOOD (ROUTINE X 2)  CULTURE, BLOOD (ROUTINE X 2)  CULTURE, RESPIRATORY W GRAM STAIN  RESPIRATORY PANEL BY PCR  LACTIC ACID, PLASMA  LACTIC ACID, PLASMA  AMMONIA  BLOOD GAS, VENOUS  CK  MAGNESIUM  PHOSPHORUS  PREALBUMIN  TSH  VITAMIN B12  FOLATE  IRON AND TIBC  FERRITIN  PROCALCITONIN  LEGIONELLA PNEUMOPHILA SEROGP 1 UR AG  STREP PNEUMONIAE URINARY ANTIGEN  PROCALCITONIN    EKG EKG Interpretation  Date/Time:  Thursday October 19 2022 20:20:07 EST Ventricular Rate:  67 PR Interval:  163 QRS Duration: 100 QT Interval:  439 QTC Calculation: 464 R Axis:   8 Text Interpretation: Sinus rhythm Borderline repolarization abnormality when compared to prior, similar appearance. No STEMI Confirmed by Antony Blackbird [phone]) on 10/19/2022 9:21:45 PM  Radiology CT HEAD WO CONTRAST (5MM)  Result Date: 10/19/2022 CLINICAL DATA:  Mental status change EXAM: CT HEAD WITHOUT CONTRAST TECHNIQUE: Contiguous axial images were obtained from the base of the skull through the vertex without intravenous contrast. RADIATION DOSE REDUCTION: This exam was performed according to the departmental dose-optimization program which includes automated exposure control, adjustment of the mA and/or kV according to patient size and/or use of iterative reconstruction technique. COMPARISON:  CT brain 09/13/2022 FINDINGS: Brain: There is motion degradation. No acute  territorial infarction, hemorrhage or intracranial mass. Atrophy and chronic small vessel ischemic changes of the white matter. Stable ventricle size. Vascular: No hyperdense vessels.  Carotid vascular calcification. Skull: Normal. Negative for fracture or focal lesion. Sinuses/Orbits: No acute finding. Other: None IMPRESSION: 1. Motion degraded exam. No definite CT evidence for acute intracranial abnormality. 2. Atrophy and chronic small vessel ischemic changes of the white matter. Electronically Signed   By: Donavan Foil M.D.   On: 10/19/2022 22:02   DG Chest Portable 1 View  Result Date: 10/19/2022 CLINICAL DATA:  Cough, shortness of breath, wheezing EXAM: PORTABLE CHEST 1 VIEW COMPARISON:  09/13/2022 FINDINGS: Mild retrocardiac opacity, suspicious for pneumonia. Cardiomegaly with pulmonary vascular congestion. No frank interstitial edema. No pleural effusion or pneumothorax. IMPRESSION: Mild retrocardiac opacity, suspicious for pneumonia. Electronically Signed   By: Julian Hy M.D.   On: 10/19/2022 21:21    Procedures Procedures    CRITICAL CARE Performed by: Gwenyth Allegra Odin Mariani Total critical care time: 35 minutes Critical care time was exclusive of separately billable procedures and treating other patients. Critical care was necessary to treat or prevent imminent or life-threatening deterioration. Critical care was time spent personally by me on the following activities: development of treatment plan with patient and/or surrogate as well as nursing, discussions with consultants, evaluation of patient's response to treatment, examination of patient, obtaining history from patient or surrogate, ordering and performing treatments and interventions, ordering and review of laboratory studies, ordering and review of radiographic studies, pulse oximetry and re-evaluation of patient's condition.   Medications Ordered in ED Medications  azithromycin (ZITHROMAX) 500 mg in sodium chloride  0.9 % 250 mL IVPB (has no administration in time range)  cefTRIAXone (ROCEPHIN) 2 g in sodium chloride 0.9 % 100 mL IVPB (has no administration in time range)  azithromycin (ZITHROMAX) 500 mg in sodium chloride 0.9 % 250 mL IVPB (has no administration in time range)  methylPREDNISolone sodium succinate (SOLU-MEDROL) 125 mg/2 mL injection 125 mg (125 mg Intravenous Given 10/19/22 2232)  cefTRIAXone (ROCEPHIN) 1 g in sodium chloride 0.9 % 100 mL IVPB (1 g Intravenous New Bag/Given 10/19/22 2314)  ED Course/ Medical Decision Making/ A&P                           Medical Decision Making Amount and/or Complexity of Data Reviewed Labs: ordered. Radiology: ordered.  Risk Prescription drug management. Decision regarding hospitalization.    [name] is a 88 y.o. female with a past medical history significant for hypertension, GERD, CHF, asthma, MGUS, obstructive chronic bronchitis, CKD, previous appendectomy, and cholecystectomy who presents for wheezing, shortness of breath, and altered mental status.  According to EMS report to nursing from family, patient has been not quite acting at her baseline and has not been getting up as she normally does.  EMS noted she was wheezing throughout and gave an albuterol.  Patient was more hypertensive than baseline.  Patient had a fall last month but otherwise patient is denying any pain in her head or neck.  She denies fevers but does say yes to chills, productive cough, shortness breath, and wheezing.  She denies any chest pain or palpitations.  She denies any nausea, vomiting, constipation, diarrhea, or urinary symptoms.  She denies any change in her chronic leg swelling.  Denies any leg pain.  Denies palpitations.  Denies any other complaints.  On exam, patient does have wheezing in all lung fields.  She has some rhonchi as well.  Some faint rales.  Legs are quite edematous but she says is unchanged from baseline.  Abdomen is nontender.  Chest is  nontender.  Patient following commands and is answering all questions appropriately.  Patient think she is thinking normally and just feels tired.  Clinically I do suspect patient has either a viral URI versus pneumonia versus fluid overload versus asthma exacerbation.  With all the wheezing, will give some steroids and more breathing treatment.  Will check labs and a chest x-ray.  Due to the edema and the rales will get a BNP.  With lack of tachycardia or chest pain or hypoxia have low suspicion for thromboembolic etiology at this time.  Given blood pressure in the 190s with EMS report of some degree of altered mental status, will get CT head.  Anticipate reassessment after workup is completed to determine disposition.  Workup returned and patient does have evidence of pneumonia.  There is also elevation in her BNP although there is no prior for comparison.  Troponin appears stable at 140 and patient is not hypoxic.  Due to her pneumonia, mild large mental status, and respiratory symptoms will call for admission.  Patient will get antibiotics and be admitted.  Family and patient agrees with plan.         Final Clinical Impression(s) / ED Diagnoses Final diagnoses:  Pneumonia due to infectious organism, unspecified laterality, unspecified part of lung  Shortness of breath  Wheezing    Clinical Impression: 1. Pneumonia due to infectious organism, unspecified laterality, unspecified part of lung   2. Shortness of breath   3. Wheezing     Disposition: Admit  This note was prepared with assistance of Dragon voice recognition software. Occasional wrong-word or sound-a-like substitutions may have occurred due to the inherent limitations of voice recognition software.      Justyce Yeater, Gwenyth Allegra, MD 10/19/22 [phone]

## 2022-10-19 NOTE — Subjective & Objective (Signed)
Patient has dementia at baseline but has been declining recently no pain patient was noted to have some wheezing and rales and started on albuterol by EMS

## 2022-10-19 NOTE — Assessment & Plan Note (Signed)
In the setting of MGUS will obtain anemia panel continue to follow transfuse as needed for hemoglobin below 7

## 2022-10-19 NOTE — Assessment & Plan Note (Signed)
-  no chest pain no EKG changes in the setting of  increased work of breathing and  chronic kidney disease likely due to demand ischemia and poor clearance, monitor on telemetry and cycle cardiac enzymes to trend.  if continues to rise will need further work-up Echo in the morning. Given CHF could be contributing but also let cardiology know patient has been admitted

## 2022-10-19 NOTE — Assessment & Plan Note (Addendum)
Restart home medications    hydralazine 50 mg p.o. twice daily Given COPD hold off on coreg, hr in 60;s

## 2022-10-19 NOTE — Assessment & Plan Note (Signed)
-  -   Will initiate: Steroid taper  -  Antibiotics Rocephin azithromycin - Albuterol  PRN, - scheduled duoneb,  -  Breo or Dulera at discharge   -  Mucinex.  Titrate O2 to saturation >90%. Follow patients respiratory status.   nfluenza PCR negative Ordered VBG     Currently mentating well no evidence of symptomatic hypercarbia

## 2022-10-19 NOTE — Assessment & Plan Note (Signed)
-   most likely multifactorial secondary to combination of   infection underlying dementia           - treat underlining infection   - Hold contributing medications      - if no improvement may need further imaging to evaluate for CNS pathology pathology such as MRI of the brain   - neurological exam appears to be nonfocal but patient unable to cooperate fully   - VBG ordered- no history of liver disease ammonia ordered

## 2022-10-19 NOTE — ED Notes (Signed)
Labs collected at 2133. Accidentally clicked [id number].

## 2022-10-19 NOTE — Telephone Encounter (Signed)
Formed placed back on [name]'s desk awaiting TB results and signature.

## 2022-10-20 ENCOUNTER — Inpatient Hospital Stay (HOSPITAL_COMMUNITY): Payer: Medicare Other

## 2022-10-20 ENCOUNTER — Other Ambulatory Visit: Payer: Self-pay | Admitting: Nurse Practitioner

## 2022-10-20 DIAGNOSIS — R079 Chest pain, unspecified: Secondary | ICD-10-CM | POA: Diagnosis not present

## 2022-10-20 DIAGNOSIS — R609 Edema, unspecified: Secondary | ICD-10-CM

## 2022-10-20 DIAGNOSIS — N1832 Chronic kidney disease, stage 3b: Secondary | ICD-10-CM | POA: Diagnosis not present

## 2022-10-20 DIAGNOSIS — J189 Pneumonia, unspecified organism: Secondary | ICD-10-CM | POA: Diagnosis not present

## 2022-10-20 DIAGNOSIS — I5043 Acute on chronic combined systolic (congestive) and diastolic (congestive) heart failure: Secondary | ICD-10-CM

## 2022-10-20 DIAGNOSIS — I10 Essential (primary) hypertension: Secondary | ICD-10-CM | POA: Diagnosis not present

## 2022-10-20 DIAGNOSIS — R1013 Epigastric pain: Secondary | ICD-10-CM

## 2022-10-20 LAB — ECHOCARDIOGRAM COMPLETE
AR max vel: 2.05 cm2
AV Area VTI: 2.05 cm2
AV Area mean vel: 1.93 cm2
AV Mean grad: 6 mmHg
AV Peak grad: 11.4 mmHg
Ao pk vel: 1.69 m/s
Area-P 1/2: 3.48 cm2
Calc EF: 61.2 %
Height: 59 in
S' Lateral: 3.2 cm
Single Plane A2C EF: 60.9 %
Single Plane A4C EF: 63.2 %
Weight: 2160 oz

## 2022-10-20 LAB — BLOOD GAS, VENOUS
Acid-base deficit: 4.2 mmol/L — ABNORMAL HIGH (ref 0.0–2.0)
Bicarbonate: 21 mmol/L (ref 20.0–28.0)
O2 Saturation: 68.2 %
Patient temperature: 37
pCO2, Ven: 38 mmHg — ABNORMAL LOW (ref 44–60)
pH, Ven: 7.35 (ref 7.25–7.43)
pO2, Ven: 42 mmHg (ref 32–45)

## 2022-10-20 LAB — COMPREHENSIVE METABOLIC PANEL
ALT: 13 U/L (ref 0–44)
AST: 20 U/L (ref 15–41)
Albumin: 2.8 g/dL — ABNORMAL LOW (ref 3.5–5.0)
Alkaline Phosphatase: 57 U/L (ref 38–126)
Anion gap: 12 (ref 5–15)
BUN: 24 mg/dL — ABNORMAL HIGH (ref 8–23)
CO2: 19 mmol/L — ABNORMAL LOW (ref 22–32)
Calcium: 9.1 mg/dL (ref 8.9–10.3)
Chloride: 111 mmol/L (ref 98–111)
Creatinine, Ser: 1.53 mg/dL — ABNORMAL HIGH (ref 0.44–1.00)
GFR, Estimated: 33 mL/min — ABNORMAL LOW (ref >60)
Glucose, Bld: 133 mg/dL — ABNORMAL HIGH (ref 70–99)
Potassium: 4.2 mmol/L (ref 3.5–5.1)
Sodium: 142 mmol/L (ref 135–145)
Total Bilirubin: 0.7 mg/dL (ref 0.3–1.2)
Total Protein: 8 g/dL (ref 6.5–8.1)

## 2022-10-20 LAB — PROCALCITONIN
Procalcitonin: 2.43 ng/mL
Procalcitonin: 1.29 ng/mL

## 2022-10-20 LAB — RESPIRATORY PANEL BY PCR

## 2022-10-20 LAB — CBC
HCT: 30.9 % — ABNORMAL LOW (ref 36.0–46.0)
Hemoglobin: 9.1 g/dL — ABNORMAL LOW (ref 12.0–15.0)
MCH: 26.1 pg (ref 26.0–34.0)
MCHC: 29.4 g/dL — ABNORMAL LOW (ref 30.0–36.0)
MCV: 88.5 fL (ref 80.0–100.0)
Platelets: 218 10*3/uL (ref 150–400)
RBC: 3.49 MIL/uL — ABNORMAL LOW (ref 3.87–5.11)
RDW: 14.6 % (ref 11.5–15.5)
WBC: 7.4 10*3/uL (ref 4.0–10.5)
nRBC: 0 % (ref 0.0–0.2)

## 2022-10-20 LAB — FERRITIN: Ferritin: 235 ng/mL (ref 11–307)

## 2022-10-20 LAB — PREALBUMIN: Prealbumin: 9 mg/dL — ABNORMAL LOW (ref 18–38)

## 2022-10-20 LAB — TROPONIN I (HIGH SENSITIVITY): Troponin I (High Sensitivity): 141 ng/L (ref <18)

## 2022-10-20 LAB — PHOSPHORUS
Phosphorus: 3.6 mg/dL (ref 2.5–4.6)
Phosphorus: 5.1 mg/dL — ABNORMAL HIGH (ref 2.5–4.6)

## 2022-10-20 LAB — CK: Total CK: 250 U/L — ABNORMAL HIGH (ref 38–234)

## 2022-10-20 LAB — IRON AND TIBC
Iron: 20 ug/dL — ABNORMAL LOW (ref 28–170)
Saturation Ratios: 10 % — ABNORMAL LOW (ref 10.4–31.8)
TIBC: 206 ug/dL — ABNORMAL LOW (ref 250–450)
UIBC: 186 ug/dL

## 2022-10-20 LAB — FOLATE: Folate: 15.3 ng/mL (ref >5.9)

## 2022-10-20 LAB — MAGNESIUM
Magnesium: 1.9 mg/dL (ref 1.7–2.4)
Magnesium: 2.2 mg/dL (ref 1.7–2.4)

## 2022-10-20 LAB — VITAMIN B12: Vitamin B-12: 722 pg/mL (ref 180–914)

## 2022-10-20 LAB — AMMONIA: Ammonia: 15 umol/L (ref 9–35)

## 2022-10-20 LAB — STREP PNEUMONIAE URINARY ANTIGEN: Strep Pneumo Urinary Antigen: NEGATIVE

## 2022-10-20 LAB — TSH: TSH: 1.355 u[IU]/mL (ref 0.350–4.500)

## 2022-10-20 MED ORDER — FUROSEMIDE 10 MG/ML IJ SOLN
40.0000 mg | Freq: Every day | INTRAMUSCULAR | Status: DC
  Administered 2022-10-20 – 2022-10-21 (×2): 40 mg via INTRAVENOUS
  Filled 2022-10-20 (×2): qty 4

## 2022-10-20 MED ORDER — ISOSORBIDE MONONITRATE ER 30 MG PO TB24
30.0000 mg | ORAL_TABLET | Freq: Every day | ORAL | Status: DC
  Administered 2022-10-20 – 2022-10-25 (×6): 30 mg via ORAL
  Filled 2022-10-20 (×6): qty 1

## 2022-10-20 MED ORDER — PANTOPRAZOLE SODIUM 40 MG PO TBEC
40.0000 mg | DELAYED_RELEASE_TABLET | Freq: Every day | ORAL | Status: DC
  Administered 2022-10-20 – 2022-10-25 (×6): 40 mg via ORAL
  Filled 2022-10-20 (×6): qty 1

## 2022-10-20 MED ORDER — PREDNISONE 20 MG PO TABS
40.0000 mg | ORAL_TABLET | Freq: Every day | ORAL | Status: AC
  Administered 2022-10-21 – 2022-10-24 (×4): 40 mg via ORAL
  Filled 2022-10-20 (×4): qty 2

## 2022-10-20 MED ORDER — METHYLPREDNISOLONE SODIUM SUCC 40 MG IJ SOLR
40.0000 mg | Freq: Two times a day (BID) | INTRAMUSCULAR | Status: AC
  Administered 2022-10-20 (×2): 40 mg via INTRAVENOUS
  Filled 2022-10-20 (×2): qty 1

## 2022-10-20 MED ORDER — HYDRALAZINE HCL 50 MG PO TABS
50.0000 mg | ORAL_TABLET | Freq: Two times a day (BID) | ORAL | Status: DC
  Administered 2022-10-20 – 2022-10-25 (×11): 50 mg via ORAL
  Filled 2022-10-20 (×10): qty 1
  Filled 2022-10-20: qty 2

## 2022-10-20 MED ORDER — OLANZAPINE 5 MG PO TABS
5.0000 mg | ORAL_TABLET | Freq: Every day | ORAL | Status: DC
  Administered 2022-10-20 – 2022-10-24 (×5): 5 mg via ORAL
  Filled 2022-10-20 (×6): qty 1

## 2022-10-20 MED ORDER — SODIUM CHLORIDE 0.9 % IV SOLN: 250.0000 mL | INTRAVENOUS | Status: DC | PRN

## 2022-10-20 MED ORDER — GUAIFENESIN ER 600 MG PO TB12
600.0000 mg | ORAL_TABLET | Freq: Two times a day (BID) | ORAL | Status: DC
  Administered 2022-10-20 – 2022-10-25 (×11): 600 mg via ORAL
  Filled 2022-10-20 (×11): qty 1

## 2022-10-20 MED ORDER — ALBUTEROL SULFATE (2.5 MG/3ML) 0.083% IN NEBU: 2.5000 mg | INHALATION_SOLUTION | RESPIRATORY_TRACT | Status: DC | PRN

## 2022-10-20 MED ORDER — HYDROCODONE-ACETAMINOPHEN 5-325 MG PO TABS: 1.0000 | ORAL_TABLET | ORAL | Status: DC | PRN

## 2022-10-20 MED ORDER — SODIUM CHLORIDE 0.9% FLUSH: 3.0000 mL | INTRAVENOUS | Status: DC | PRN

## 2022-10-20 MED ORDER — SODIUM CHLORIDE 0.9% FLUSH
3.0000 mL | Freq: Two times a day (BID) | INTRAVENOUS | Status: DC
  Administered 2022-10-20 – 2022-10-25 (×9): 3 mL via INTRAVENOUS

## 2022-10-20 MED ORDER — ACETAMINOPHEN 650 MG RE SUPP: 650.0000 mg | Freq: Four times a day (QID) | RECTAL | Status: DC | PRN

## 2022-10-20 MED ORDER — TUBERCULIN PPD 5 UNIT/0.1ML ID SOLN
5.0000 [IU] | Freq: Once | INTRADERMAL | Status: AC
  Administered 2022-10-20: 5 [IU] via INTRADERMAL
  Filled 2022-10-20: qty 0.1

## 2022-10-20 MED ORDER — ACETAMINOPHEN 325 MG PO TABS: 650.0000 mg | ORAL_TABLET | Freq: Four times a day (QID) | ORAL | Status: DC | PRN

## 2022-10-20 NOTE — ED Notes (Signed)
ED TO INPATIENT HANDOFF REPORT  ED Nurse Name and Phone #: Danise Edge Name/Age/Gender [name] 88 y.o. female Room/Bed: WA14/WA14  Code Status   Code Status: Full Code  Home/SNF/Other Home Patient oriented to: self Is this baseline? Yes   Triage Complete: Triage complete  Chief Complaint CAP (community acquired pneumonia) [J18.9]  Triage Note From home, per family pt is altered. Pt does have dementia. However, they stated that they came home and she was not at her baseline and she has not been getting up as normal. Pt denies pain. She follows commands. Wheezing noted en route. Albuterol given with EMS. Hypertensive.   Allergies Allergies  Allergen Reactions   Aspirin Other (See Comments)    REACTION: upset stomach   Codeine Other (See Comments)    REACTION: "MAKES MY BODY GO CRAZY; CRAMPS"   Latex Nausea And Vomiting    Level of Care/Admitting Diagnosis ED Disposition     ED Disposition  Admit   Condition  --   Comment  H[facility]: [facility] [[id number]]  Level of Care: Telemetry [5]  Admit to tele based on following criteria: Other see comments  Admit to tele based on following criteria: Acute CHF  Comments: elevated trop  May admit patient to [facility] if equivalent level of care is available:: No  Covid Evaluation: Asymptomatic - no recent exposure (last 10 days) testing not required  Diagnosis: CAP (community acquired pneumonia) [366440]  Admitting Physician: Toy Baker [[id number]]  Attending Physician: Toy Baker [[id number]]  Certification:: I certify this patient will need inpatient services for at least 2 midnights  Estimated Length of Stay: 2          B Medical/Surgery History Past Medical History:  Diagnosis Date   Anemia, unspecified    Anxiety    Arthritis    Asthma    Delusions ([place])    Depression    Edema 2005   "since put the glass in them"    GERD (gastroesophageal reflux  disease)    Lumbago    Lymphedema    Obstructive chronic bronchitis without exacerbation    Other abnormal glucose    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   Systolic heart failure ([place])    EF 35 to 40% per echo January 2015   Unspecified essential hypertension    Unspecified nonpsychotic mental disorder    Unspecified venous (peripheral) insufficiency    Unspecified venous (peripheral) insufficiency    Unspecified vitamin D deficiency    Past Surgical History:  Procedure Laterality Date   APPENDECTOMY     CERVICAL SPINE SURGERY     x 2   CHOLECYSTECTOMY     ESOPHAGOGASTRODUODENOSCOPY N/A 04/05/2015   Procedure: ESOPHAGOGASTRODUODENOSCOPY (EGD);  Surgeon: [name], MD;  [facility];  Service: Endoscopy;  Laterality: N/A;   FLEXIBLE SIGMOIDOSCOPY N/A 04/05/2015   Procedure: FLEXIBLE SIGMOIDOSCOPY;  Surgeon: [name], MD;  Location: [facility];  Service: Endoscopy;  Laterality: N/A;   HAND SURGERY     LUMBAR [place] SURGERY     PARTIAL HYSTERECTOMY     TONSILLECTOMY       A IV Location/Drains/Wounds Patient Lines/Drains/Airways Status     Active Line/Drains/Airways     Name Placement date Placement time Site Days   Peripheral IV 10/19/22 20 G Left Antecubital 10/19/22  2232  Antecubital  1   External Urinary Catheter 10/19/22  2345  --  1  Intake/Output Last 24 hours  Intake/Output Summary (Last 24 hours) at 10/20/2022 1106 Last data filed at 10/20/2022 0546 Gross per 24 hour  Intake 358.34 ml  Output --  Net 358.34 ml    Labs/Imaging Results for orders placed or performed during the hospital encounter of 10/19/22 (from the past 48 hour(s))  Blood gas, venous     Status: Abnormal   Collection Time: 10/19/22 12:43 AM  Result Value Ref Range   pH, Ven 7.35 7.25 - 7.43   pCO2, Ven 38 (L) 44 - 60 mmHg   pO2, Ven 42 32 - 45 mmHg   Bicarbonate 21.0 20.0 - 28.0 mmol/L   Acid-base deficit 4.2 (H) 0.0 - 2.0 mmol/L   O2 Saturation 68.2 %    Patient temperature 37.0     Comment: Performed at [facility], [place] [address] [place], [place] [address]  CBC with Differential     Status: Abnormal   Collection Time: 10/19/22  8:51 PM  Result Value Ref Range   WBC 6.6 4.0 - 10.5 K/uL   RBC 3.23 (L) 3.87 - 5.11 MIL/uL   Hemoglobin 8.5 (L) 12.0 - 15.0 g/dL   HCT 28.7 (L) 36.0 - 46.0 %   MCV 88.9 80.0 - 100.0 fL   MCH 26.3 26.0 - 34.0 pg   MCHC 29.6 (L) 30.0 - 36.0 g/dL   RDW 14.5 11.5 - 15.5 %   Platelets 249 150 - 400 K/uL   nRBC 0.0 0.0 - 0.2 %   Neutrophils Relative % 72 %   Neutro Abs 4.8 1.7 - 7.7 K/uL   Lymphocytes Relative 16 %   Lymphs Abs 1.0 0.7 - 4.0 K/uL   Monocytes Relative 10 %   Monocytes Absolute 0.7 0.1 - 1.0 K/uL   Eosinophils Relative 0 %   Eosinophils Absolute 0.0 0.0 - 0.5 K/uL   Basophils Relative 1 %   Basophils Absolute 0.0 0.0 - 0.1 K/uL   Immature Granulocytes 1 %   Abs Immature Granulocytes 0.03 0.00 - 0.07 K/uL    Comment: Performed at [facility], [place] [address], [place], [place] [address]  Comprehensive metabolic panel     Status: Abnormal   Collection Time: 10/19/22  8:51 PM  Result Value Ref Range   Sodium 144 135 - 145 mmol/L   Potassium 3.9 3.5 - 5.1 mmol/L   Chloride 114 (H) 98 - 111 mmol/L   CO2 21 (L) 22 - 32 mmol/L   Glucose, Bld 73 70 - 99 mg/dL    Comment: Glucose reference range applies only to samples taken after fasting for at least 8 hours.   BUN 22 8 - 23 mg/dL   Creatinine, Ser 1.32 (H) 0.44 - 1.00 mg/dL   Calcium 9.2 8.9 - 10.3 mg/dL   Total Protein 7.7 6.5 - 8.1 g/dL   Albumin 2.8 (L) 3.5 - 5.0 g/dL   AST 20 15 - 41 U/L   ALT 14 0 - 44 U/L   Alkaline Phosphatase 64 38 - 126 U/L   Total Bilirubin 0.7 0.3 - 1.2 mg/dL   GFR, Estimated 39 (L) >60 mL/min    Comment: (NOTE) Calculated using the CKD-EPI Creatinine Equation (2021)    Anion gap 9 5 - 15    Comment: Performed at [facility], [place] [address],  [place], [place] [address]  Lactic acid, plasma     Status: None   Collection Time: 10/19/22  8:51 PM  Result Value Ref  Range   Lactic Acid, Venous 1.5 0.5 - 1.9 mmol/L    Comment: Performed at [facility], [place] [address], [place], [place] [address]  Resp panel by RT-PCR (RSV, Flu A&B, Covid) Anterior Nasal Swab     Status: None   Collection Time: 10/19/22  8:51 PM   Specimen: Anterior Nasal Swab  Result Value Ref Range   SARS Coronavirus 2 by RT PCR NEGATIVE NEGATIVE    Comment: (NOTE) SARS-CoV-2 target nucleic acids are NOT DETECTED.  The SARS-CoV-2 RNA is generally detectable in upper respiratory specimens during the acute phase of infection. The lowest concentration of SARS-CoV-2 viral copies this assay can detect is 138 copies/mL. A negative result does not preclude SARS-Cov-2 infection and should not be used as the sole basis for treatment or other patient management decisions. A negative result may occur with  improper specimen collection/handling, submission of specimen other than nasopharyngeal swab, presence of viral mutation(s) within the areas targeted by this assay, and inadequate number of viral copies(<138 copies/mL). A negative result must be combined with clinical observations, patient history, and epidemiological information. The expected result is Negative.  Fact Sheet for Patients:  EntrepreneurPulse.com.au  Fact Sheet for Healthcare Providers:  IncredibleEmployment.be  This test is no t yet approved or cleared by the [place] FDA and  has been authorized for detection and/or diagnosis of SARS-CoV-2 by FDA under an Emergency Use Authorization (EUA). This EUA will remain  in effect (meaning this test can be used) for the duration of the COVID-19 declaration under Section 564(b)(1) of the Act, 21 U.S.C.section 360bbb-3(b)(1), unless the authorization is terminated  or revoked sooner.       Influenza A  by PCR NEGATIVE NEGATIVE   Influenza B by PCR NEGATIVE NEGATIVE    Comment: (NOTE) The Xpert Xpress SARS-CoV-2/FLU/RSV plus assay is intended as an aid in the diagnosis of influenza from Nasopharyngeal swab specimens and should not be used as a sole basis for treatment. Nasal washings and aspirates are unacceptable for Xpert Xpress SARS-CoV-2/FLU/RSV testing.  Fact Sheet for Patients: EntrepreneurPulse.com.au  Fact Sheet for Healthcare Providers: IncredibleEmployment.be  This test is not yet approved or cleared by the [place] FDA and has been authorized for detection and/or diagnosis of SARS-CoV-2 by FDA under an Emergency Use Authorization (EUA). This EUA will remain in effect (meaning this test can be used) for the duration of the COVID-19 declaration under Section 564(b)(1) of the Act, 21 U.S.C. section 360bbb-3(b)(1), unless the authorization is terminated or revoked.     Resp Syncytial Virus by PCR NEGATIVE NEGATIVE    Comment: (NOTE) Fact Sheet for Patients: EntrepreneurPulse.com.au  Fact Sheet for Healthcare Providers: IncredibleEmployment.be  This test is not yet approved or cleared by the [place] FDA and has been authorized for detection and/or diagnosis of SARS-CoV-2 by FDA under an Emergency Use Authorization (EUA). This EUA will remain in effect (meaning this test can be used) for the duration of the COVID-19 declaration under Section 564(b)(1) of the Act, 21 U.S.C. section 360bbb-3(b)(1), unless the authorization is terminated or revoked.  Performed at [facility], [place] [address], [place], [place] [address]   Brain natriuretic peptide     Status: Abnormal   Collection Time: 10/19/22  8:51 PM  Result Value Ref Range   B Natriuretic Peptide 1,976.4 (H) 0.0 - 100.0 pg/mL    Comment: Performed at [facility], [place] [address]  [place], [place] [address]  Troponin I (High Sensitivity)  Status: Abnormal   Collection Time: 10/19/22  8:51 PM  Result Value Ref Range   Troponin I (High Sensitivity) 141 (HH) <18 ng/L    Comment: CRITICAL RESULT CALLED TO, READ BACK BY AND VERIFIED WITH Aaron Edelman, RN 10/19/22 2238 J. COLE (NOTE) Elevated high sensitivity troponin I (hsTnI) values and significant  changes across serial measurements may suggest ACS but many other  chronic and acute conditions are known to elevate hsTnI results.  Refer to the "Links" section for chest pain algorithms and additional  guidance. Performed at [facility], [place] [address] [place], [place] [address]   TSH     Status: None   Collection Time: 10/19/22  8:51 PM  Result Value Ref Range   TSH 1.355 0.350 - 4.500 uIU/mL    Comment: Performed by a 3rd Generation assay with a functional sensitivity of <=0.01 uIU/mL. Performed at [facility], [place] [address] [place], [place] [address]   Reticulocytes     Status: Abnormal   Collection Time: 10/19/22  8:51 PM  Result Value Ref Range   Retic Ct Pct 1.9 0.4 - 3.1 %   RBC. 3.15 (L) 3.87 - 5.11 MIL/uL   Retic Count, Absolute 58.3 19.0 - 186.0 K/uL   Immature Retic Fract 23.5 (H) 2.3 - 15.9 %    Comment: Performed at [facility], [place] [address], [place], [place] [address]  Blood culture (routine x 2)     Status: None (Preliminary result)   Collection Time: 10/19/22 10:15 PM   Specimen: BLOOD  Result Value Ref Range   Specimen Description      BLOOD LEFT ANTECUBITAL Performed at [place] [address] [place], [place] [address]    Special Requests      BOTTLES DRAWN AEROBIC AND ANAEROBIC Blood Culture results may not be optimal due to an inadequate volume of blood received in culture bottles Performed at [facility], [place] [address], [place], [place] [address]    Culture      NO GROWTH < 12  HOURS Performed at [place] [address], [place], [place] [address]    Report Status PENDING   Lactic acid, plasma     Status: None   Collection Time: 10/19/22 10:17 PM  Result Value Ref Range   Lactic Acid, Venous 1.5 0.5 - 1.9 mmol/L    Comment: Performed at [name], [place] [address] [place], [place] [address]  Blood culture (routine x 2)     Status: None (Preliminary result)   Collection Time: 10/19/22 10:17 PM   Specimen: BLOOD  Result Value Ref Range   Specimen Description      BLOOD RIGHT ANTECUBITAL Performed at [place] [address] [place], [place] [address]    Special Requests      BOTTLES DRAWN AEROBIC AND ANAEROBIC Blood Culture adequate volume Performed at [place] [address], [place], [place] [address]    Culture      NO GROWTH < 12 HOURS Performed at [place] [address], [place], [place] [address]    Report Status PENDING   Troponin I (High Sensitivity)     Status: Abnormal   Collection Time: 10/19/22 10:36 PM  Result Value Ref Range   Troponin I (High Sensitivity) 140 (HH) <18 ng/L    Comment: CRITICAL VALUE NOTED. VALUE IS CONSISTENT WITH PREVIOUSLY REPORTED/CALLED VALUE (NOTE) Elevated high  sensitivity troponin I (hsTnI) values and significant  changes across serial measurements may suggest ACS but many other  chronic and acute conditions are known to elevate hsTnI results.  Refer to the "Links" section for chest pain algorithms and additional  guidance. Performed at [facility], [place] [address], [place], [place] [address]   Urinalysis, Routine w reflex microscopic     Status: Abnormal   Collection Time: 10/19/22 10:36 PM  Result Value Ref Range   Color, Urine YELLOW YELLOW   APPearance CLEAR CLEAR   Specific Gravity, Urine 1.013 1.005 - 1.030   pH 5.0 5.0 - 8.0   Glucose, UA NEGATIVE NEGATIVE mg/dL   Hgb urine dipstick  NEGATIVE NEGATIVE   Bilirubin Urine NEGATIVE NEGATIVE   Ketones, ur NEGATIVE NEGATIVE mg/dL   Protein, ur 100 (A) NEGATIVE mg/dL   Nitrite NEGATIVE NEGATIVE   Leukocytes,Ua NEGATIVE NEGATIVE   RBC / HPF 0-5 0 - 5 RBC/hpf   WBC, UA 0-5 0 - 5 WBC/hpf   Bacteria, UA RARE (A) NONE SEEN   Squamous Epithelial / LPF 0-5 0 - 5   Mucus PRESENT     Comment: Performed at [facility], [place] [address] [place], [place] [address]  Expectorated Sputum Assessment w Gram Stain, Rflx to Resp Cult     Status: None   Collection Time: 10/19/22 10:36 PM   Specimen: Expectorated Sputum  Result Value Ref Range   Specimen Description EXPECTORATED SPUTUM    Special Requests NONE    Sputum evaluation      THIS SPECIMEN IS ACCEPTABLE FOR SPUTUM CULTURE Performed at [facility], [place] [address] [place], [place] [address]    Report Status 10/19/2022 FINAL   Culture, Respiratory w Gram Stain     Status: None (Preliminary result)   Collection Time: 10/19/22 10:36 PM  Result Value Ref Range   Specimen Description      EXPECTORATED SPUTUM Performed at [facility], [place] [address] [place], [place] [address]    Special Requests      NONE Reflexed from [id number] Performed at [facility], [place] [address], [place], [place] [address]    Gram Stain      FEW SQUAMOUS EPITHELIAL CELLS PRESENT MODERATE WBC PRESENT,BOTH PMN AND MONONUCLEAR MODERATE GRAM NEGATIVE RODS MODERATE GRAM POSITIVE COCCI IN PAIRS Performed at [facility], [place] [address] [place], [place] [address]    Culture PENDING    Report Status PENDING   CK     Status: Abnormal   Collection Time: 10/19/22 10:36 PM  Result Value Ref Range   Total CK 250 (H) 38 - 234 U/L    Comment: Performed at [facility], [place] [address], [place], [place] [address]  Magnesium     Status: None   Collection Time: 10/19/22 10:36 PM  Result Value Ref Range   Magnesium  1.9 1.7 - 2.4 mg/dL    Comment: Performed at [facility], [place] [address], [place], [place] [address]  Phosphorus     Status: None   Collection Time: 10/19/22 10:36 PM  Result Value Ref Range   Phosphorus 3.6 2.5 - 4.6 mg/dL    Comment: Performed at [facility], [place] [address], [place], [place] [address]  Procalcitonin - Baseline     Status: None   Collection Time: 10/19/22 10:36 PM  Result Value Ref Range   Procalcitonin 2.43 ng/mL    Comment:  Interpretation: PCT > 2 ng/mL: Systemic infection (sepsis) is likely, unless other causes are known. (NOTE)       Sepsis PCT Algorithm           Lower Respiratory Tract                                      Infection PCT Algorithm    ----------------------------     ----------------------------         PCT < 0.25 ng/mL                PCT < 0.10 ng/mL          Strongly encourage             Strongly discourage   discontinuation of antibiotics    initiation of antibiotics    ----------------------------     -----------------------------       PCT 0.25 - 0.50 ng/mL            PCT 0.10 - 0.25 ng/mL               OR       >80% decrease in PCT            Discourage initiation of                                            antibiotics      Encourage discontinuation           of antibiotics    ----------------------------     -----------------------------         PCT >= 0.50 ng/mL              PCT 0.26 - 0.50 ng/mL               AND       <80% decrease in PCT              Encourage initiation of                                             antibiotics       Encourage continuation           of antibiotics    ----------------------------     -----------------------------        PCT >= 0.50 ng/mL                  PCT > 0.50 ng/mL               AND         increase in PCT                  Strongly encourage                                      initiation of antibiotics    Strongly encourage escalation            of antibiotics                                     -----------------------------  PCT <= 0.25 ng/mL                                                 OR                                        > 80% decrease in PCT                                      Discontinue / Do not initiate                                             antibiotics  Performed at [place] [address] [place], [place] [address]   Ammonia     Status: None   Collection Time: 10/20/22 12:43 AM  Result Value Ref Range   Ammonia 15 9 - 35 umol/L    Comment: Performed at [facility], [place] [address] [place], [place] [address]  Vitamin B12     Status: None   Collection Time: 10/20/22 12:43 AM  Result Value Ref Range   Vitamin B-12 722 180 - 914 pg/mL    Comment: (NOTE) This assay is not validated for testing neonatal or myeloproliferative syndrome specimens for Vitamin B12 levels. Performed at [facility], [place] [address], [place], [place] [address]   Folate     Status: None   Collection Time: 10/20/22 12:43 AM  Result Value Ref Range   Folate 15.3 >5.9 ng/mL    Comment: Performed at [facility], [place] [address], [place], [place] [address]  Iron and TIBC     Status: Abnormal   Collection Time: 10/20/22 12:43 AM  Result Value Ref Range   Iron 20 (L) 28 - 170 ug/dL   TIBC 206 (L) 250 - 450 ug/dL   Saturation Ratios 10 (L) 10.4 - 31.8 %   UIBC 186 ug/dL    Comment: Performed at [facility], [place] [address] [place], [place] [address]  Ferritin     Status: None   Collection Time: 10/20/22 12:43 AM  Result Value Ref Range   Ferritin 235 11 - 307 ng/mL    Comment: Performed at [facility], [place] [address] [place], [place] [address]  Strep pneumoniae urinary antigen     Status: None   Collection Time: 10/20/22 12:43 AM  Result Value Ref Range    Strep Pneumo Urinary Antigen NEGATIVE NEGATIVE    Comment:        Infection due to S. pneumoniae cannot be absolutely ruled out since the antigen present may be below the detection limit of the test. Performed at [facility], [address] [place], [place] [address]   Respiratory (~20 pathogens) panel by PCR     Status: None   Collection Time: 10/20/22 12:43 AM   Specimen: Nasopharyngeal Swab; Respiratory  Result Value Ref Range   Adenovirus NOT DETECTED NOT DETECTED   Coronavirus 229E NOT DETECTED NOT DETECTED    Comment: (NOTE)  The Coronavirus on the Respiratory Panel, DOES NOT test for the novel  Coronavirus (2019 nCoV)    Coronavirus HKU1 NOT DETECTED NOT DETECTED   Coronavirus NL63 NOT DETECTED NOT DETECTED   Coronavirus OC43 NOT DETECTED NOT DETECTED   Metapneumovirus NOT DETECTED NOT DETECTED   Rhinovirus / Enterovirus NOT DETECTED NOT DETECTED   Influenza A NOT DETECTED NOT DETECTED   Influenza B NOT DETECTED NOT DETECTED   Parainfluenza Virus 1 NOT DETECTED NOT DETECTED   Parainfluenza Virus 2 NOT DETECTED NOT DETECTED   Parainfluenza Virus 3 NOT DETECTED NOT DETECTED   Parainfluenza Virus 4 NOT DETECTED NOT DETECTED   Respiratory Syncytial Virus NOT DETECTED NOT DETECTED   Bordetella pertussis NOT DETECTED NOT DETECTED   Bordetella Parapertussis NOT DETECTED NOT DETECTED   Chlamydophila pneumoniae NOT DETECTED NOT DETECTED   Mycoplasma pneumoniae NOT DETECTED NOT DETECTED    Comment: Performed at [place] [address], [place], [place] [address]  Troponin I (High Sensitivity)     Status: Abnormal   Collection Time: 10/20/22 12:43 AM  Result Value Ref Range   Troponin I (High Sensitivity) 141 (HH) <18 ng/L    Comment: CRITICAL VALUE NOTED. VALUE IS CONSISTENT WITH PREVIOUSLY REPORTED/CALLED VALUE (NOTE) Elevated high sensitivity troponin I (hsTnI) values and significant  changes across serial measurements may suggest ACS but many other   chronic and acute conditions are known to elevate hsTnI results.  Refer to the "Links" section for chest pain algorithms and additional  guidance. Performed at [facility], [place] [address] [place], [place] [address]   CBC     Status: Abnormal   Collection Time: 10/20/22 10:40 AM  Result Value Ref Range   WBC 7.4 4.0 - 10.5 K/uL   RBC 3.49 (L) 3.87 - 5.11 MIL/uL   Hemoglobin 9.1 (L) 12.0 - 15.0 g/dL   HCT 30.9 (L) 36.0 - 46.0 %   MCV 88.5 80.0 - 100.0 fL   MCH 26.1 26.0 - 34.0 pg   MCHC 29.4 (L) 30.0 - 36.0 g/dL   RDW 14.6 11.5 - 15.5 %   Platelets 218 150 - 400 K/uL   nRBC 0.0 0.0 - 0.2 %    Comment: Performed at [facility], [place] [address], [place], [place] [address]   VAS [place] LOWER EXTREMITY VENOUS (DVT)  Result Date: 10/20/2022  Lower Venous DVT Study Patient Name:  AJEENAH HEINY  Date of Exam:   10/20/2022 Medical Rec #: [id number]         Accession #:    [id number] Date of Birth: Feb 28, 1932         Patient Gender: F Patient Age:   36 years Exam Location:  [facility] Procedure:      VAS [place] LOWER EXTREMITY VENOUS (DVT) Referring Phys: Nyoka Lint DOUTOVA --------------------------------------------------------------------------------  Indications: Edema.  Risk Factors: None identified. Limitations: Poor ultrasound/tissue interface and patient positioning, patient immobility. Comparison Study: No prior studies. Performing Technologist: Oliver Hum RVT  Examination Guidelines: A complete evaluation includes B-mode imaging, spectral Doppler, color Doppler, and power Doppler as needed of all accessible portions of each vessel. Bilateral testing is considered an integral part of a complete examination. Limited examinations for reoccurring indications may be performed as noted. The reflux portion of the exam is performed with the patient in reverse Trendelenburg.  +---------+---------------+---------+-----------+----------+--------------+  RIGHT    CompressibilityPhasicitySpontaneityPropertiesThrombus Aging +---------+---------------+---------+-----------+----------+--------------+ CFV      Full  Yes      Yes                                 +---------+---------------+---------+-----------+----------+--------------+ SFJ      Full                                                        +---------+---------------+---------+-----------+----------+--------------+ FV Prox  Full                                                        +---------+---------------+---------+-----------+----------+--------------+ FV Mid   Full                                                        +---------+---------------+---------+-----------+----------+--------------+ FV Distal               Yes      Yes                                 +---------+---------------+---------+-----------+----------+--------------+ PFV      Full                                                        +---------+---------------+---------+-----------+----------+--------------+ POP      Full           Yes      Yes                                 +---------+---------------+---------+-----------+----------+--------------+ PTV      Full                                                        +---------+---------------+---------+-----------+----------+--------------+ PERO     Full                                                        +---------+---------------+---------+-----------+----------+--------------+   +---------+---------------+---------+-----------+----------+--------------+ LEFT     CompressibilityPhasicitySpontaneityPropertiesThrombus Aging +---------+---------------+---------+-----------+----------+--------------+ CFV      Full           Yes      Yes                                 +---------+---------------+---------+-----------+----------+--------------+ SFJ      Full                                                         +---------+---------------+---------+-----------+----------+--------------+  FV Prox  Full                                                        +---------+---------------+---------+-----------+----------+--------------+ FV Mid   Full                                                        +---------+---------------+---------+-----------+----------+--------------+ FV Distal               Yes      Yes                                 +---------+---------------+---------+-----------+----------+--------------+ PFV      Full                                                        +---------+---------------+---------+-----------+----------+--------------+ POP      Full           Yes      Yes                                 +---------+---------------+---------+-----------+----------+--------------+ PTV      Full                                                        +---------+---------------+---------+-----------+----------+--------------+ PERO     Full                                                        +---------+---------------+---------+-----------+----------+--------------+    Summary: RIGHT: - There is no evidence of deep vein thrombosis in the lower extremity. However, portions of this examination were limited- see technologist comments above.  - No cystic structure found in the popliteal fossa.  LEFT: - There is no evidence of deep vein thrombosis in the lower extremity. However, portions of this examination were limited- see technologist comments above.  - No cystic structure found in the popliteal fossa.  *See table(s) above for measurements and observations.    Preliminary    CT HEAD WO CONTRAST (5MM)  Result Date: 10/19/2022 CLINICAL DATA:  Mental status change EXAM: CT HEAD WITHOUT CONTRAST TECHNIQUE: Contiguous axial images were obtained from the base of the skull through the vertex without intravenous contrast. RADIATION DOSE  REDUCTION: This exam was performed according to the departmental dose-optimization program which includes automated exposure control, adjustment of the mA and/or kV according to patient size and/or use of iterative reconstruction technique. COMPARISON:  CT brain 09/13/2022 FINDINGS: Brain: There is motion degradation. No acute territorial infarction,  hemorrhage or intracranial mass. Atrophy and chronic small vessel ischemic changes of the white matter. Stable ventricle size. Vascular: No hyperdense vessels.  Carotid vascular calcification. Skull: Normal. Negative for fracture or focal lesion. Sinuses/Orbits: No acute finding. Other: None IMPRESSION: 1. Motion degraded exam. No definite CT evidence for acute intracranial abnormality. 2. Atrophy and chronic small vessel ischemic changes of the white matter. Electronically Signed   By: Donavan Foil M.D.   On: 10/19/2022 22:02   DG Chest Portable 1 View  Result Date: 10/19/2022 CLINICAL DATA:  Cough, shortness of breath, wheezing EXAM: PORTABLE CHEST 1 VIEW COMPARISON:  09/13/2022 FINDINGS: Mild retrocardiac opacity, suspicious for pneumonia. Cardiomegaly with pulmonary vascular congestion. No frank interstitial edema. No pleural effusion or pneumothorax. IMPRESSION: Mild retrocardiac opacity, suspicious for pneumonia. Electronically Signed   By: Julian Hy M.D.   On: 10/19/2022 21:21    Pending Labs Unresulted Labs (From admission, onward)     Start     Ordered   10/20/22 0819  Comprehensive metabolic panel  Tomorrow morning,   R       Question:  Release to patient  Answer:  Immediate   10/20/22 0818   10/20/22 0819  Magnesium  Tomorrow morning,   R        10/20/22 0818   10/20/22 0819  Phosphorus  Tomorrow morning,   R        10/20/22 0818   10/20/22 0500  Prealbumin  Tomorrow morning,   R        10/19/22 2337   10/20/22 0500  Procalcitonin  Daily,   R      10/19/22 2338   10/19/22 2338  Legionella Pneumophila Serogp 1 Ur Ag  Once,    URGENT        10/19/22 2338            Vitals/Pain Today's Vitals   10/20/22 0548 10/20/22 0600 10/20/22 0930 10/20/22 0953  BP:  (!) 154/116 (!) 64/25   Pulse:  65 60   Resp:  (!) 23 20   Temp: 98.6 F (37 C)   (!) 97.5 F (36.4 C)  TempSrc: Oral   Oral  SpO2:  100% 100%   Weight:      Height:      PainSc:        Isolation Precautions No active isolations  Medications Medications  cefTRIAXone (ROCEPHIN) 2 g in sodium chloride 0.9 % 100 mL IVPB (has no administration in time range)  azithromycin (ZITHROMAX) 500 mg in sodium chloride 0.9 % 250 mL IVPB (has no administration in time range)  hydrALAZINE (APRESOLINE) tablet 50 mg (50 mg Oral Given 10/20/22 1023)  OLANZapine (ZYPREXA) tablet 5 mg (has no administration in time range)  pantoprazole (PROTONIX) EC tablet 40 mg (40 mg Oral Given 10/20/22 1023)  acetaminophen (TYLENOL) tablet 650 mg (has no administration in time range)    Or  acetaminophen (TYLENOL) suppository 650 mg (has no administration in time range)  HYDROcodone-acetaminophen (NORCO/VICODIN) 5-325 MG per tablet 1-2 tablet (has no administration in time range)  furosemide (LASIX) injection 40 mg (40 mg Intravenous Given 10/20/22 1030)  methylPREDNISolone sodium succinate (SOLU-MEDROL) 40 mg/mL injection 40 mg (40 mg Intravenous Given 10/20/22 1032)    Followed by  predniSONE (DELTASONE) tablet 40 mg (has no administration in time range)  sodium chloride flush (NS) 0.9 % injection 3 mL (3 mLs Intravenous Given 10/20/22 1032)  sodium chloride flush (NS) 0.9 % injection 3 mL (has no administration in  time range)  0.9 %  sodium chloride infusion (has no administration in time range)  albuterol (PROVENTIL) (2.5 MG/3ML) 0.083% nebulizer solution 2.5 mg (has no administration in time range)  guaiFENesin (MUCINEX) 12 hr tablet 600 mg (600 mg Oral Given 10/20/22 1023)  isosorbide mononitrate (IMDUR) 24 hr tablet 30 mg (30 mg Oral Given 10/20/22 1046)   methylPREDNISolone sodium succinate (SOLU-MEDROL) 125 mg/2 mL injection 125 mg (125 mg Intravenous Given 10/19/22 2232)  cefTRIAXone (ROCEPHIN) 1 g in sodium chloride 0.9 % 100 mL IVPB (0 g Intravenous Stopped 10/20/22 0048)  azithromycin (ZITHROMAX) 500 mg in sodium chloride 0.9 % 250 mL IVPB (0 mg Intravenous Stopped 10/20/22 0546)    Mobility walks with device High fall risk   Focused Assessments     R Recommendations: See Admitting Provider Note  Report given to:   Additional Notes:

## 2022-10-20 NOTE — ED Notes (Signed)
Patient was check for incontinence. Patient has no complaints at this time.

## 2022-10-20 NOTE — Consult Note (Addendum)
Cardiology Consultation   Patient ID: ENYAH MOMAN MRN: [id number]; DOB: [birth year]  Admit date: 10/19/2022 Date of Consult: 10/20/2022  PCP:  Lauree Chandler, NP   [place] Providers Cardiologist:  None     Patient Profile:   [name] is a 88 y.o. female with a hx of dementia, chronic combined systolic and diastolic HF with improved, venous insufficiency with chronic LC edema, HTN, COPD, MGUS, CKD who is being seen 10/20/2022 for the evaluation of acute heart failure exacerbation at the request of Dr. Maylene Roes.  History of Present Illness:   [name] is a 88 year old female with the history as detailed above who was previously followed by Dr. [name] but was last seen in 2020. She has a history of systolic HF with LVEF 10-62% in 2015 with normal nuc. Repeat TTE 04/2017 with LVEF 55-60% that was stable on TTE in 2021. She has not been seen by Cardiology since 2020.  She presented on this admission with worsening SOB and wheezing. Labs notable for trop 140>141. BNP 1976. Cr 1.33, lactate normal. Hemoglobin 9.4. CXR concerning for pneumonia. She was started on ABX and steroids for COPD exacerbation. Cardiology is consulted for concern for acute on chronic combined systolic and diastolic HF.  Currently, the patient is comfortable and on RA. States she had been feeling more SOB and tired at home. States she is feeling better this morning but continuing to feel SOB.    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/07/4853   Systolic heart failure ([place])    EF 35 to 40% per echo January 2015   Unspecified essential hypertension    Unspecified  nonpsychotic mental disorder    Unspecified venous (peripheral) insufficiency    Unspecified venous (peripheral) insufficiency    Unspecified vitamin D deficiency     Past Surgical History:  Procedure Laterality Date   APPENDECTOMY     CERVICAL SPINE SURGERY     x 2   CHOLECYSTECTOMY     ESOPHAGOGASTRODUODENOSCOPY N/A 04/05/2015   Procedure: ESOPHAGOGASTRODUODENOSCOPY (EGD);  Surgeon: [name], MD;  Location: [facility];  Service: Endoscopy;  Laterality: N/A;   FLEXIBLE SIGMOIDOSCOPY N/A 04/05/2015   Procedure: FLEXIBLE SIGMOIDOSCOPY;  Surgeon: [name], MD;  Location: [facility];  Service: Endoscopy;  Laterality: N/A;   HAND SURGERY     LUMBAR DISC SURGERY     PARTIAL HYSTERECTOMY     TONSILLECTOMY       Home Medications:  Prior to Admission medications   Medication Sig Start Date End Date Taking? Authorizing Provider  acetaminophen (TYLENOL) 650 MG CR tablet Take 650 mg by mouth every 8 (eight) hours as needed for pain.   Yes [provider]  albuterol (PROAIR HFA) 108 (90 Base) MCG/ACT inhaler INHALE 2 PUFFS EVERY 4 HOURS AS NEEDED FOR WHEEZING 01/27/22  Yes Lauree Chandler, NP  carvedilol (COREG) 6.25 MG tablet TAKE 1 TABLET BY MOUTH TWICE DAILY WITH LUNCH AND WITH SUPPER 04/19/22  Yes Lauree Chandler, NP  cetirizine (ZYRTEC) 10 MG tablet TAKE  1 TABLET BY MOUTH AS NEEDED FOR ALLERGIES 08/07/22  Yes Lauree Chandler, NP  denosumab (PROLIA) 60 MG/ML SOSY injection Inject 60 mg into the skin every 6 (six) months.   Yes [provider]  FEROSUL 325 (65 Fe) MG tablet TAKE 1 TABLET(325 MG) BY MOUTH DAILY WITH BREAKFAST Patient taking differently: Take 325 mg by mouth daily as needed (low iron). 03/15/22  Yes Lauree Chandler, NP  fluticasone-salmeterol (ADVAIR HFA) 115-21 MCG/ACT inhaler INHALE 2 PUFFS BY MOUTH TWICE DAILY 01/27/22  Yes Lauree Chandler, NP  hydrALAZINE (APRESOLINE) 50 MG tablet TAKE 1 TABLET BY MOUTH TWICE DAILY AT 8 AM AND 10 PM  01/27/22  Yes Lauree Chandler, NP  OLANZapine (ZYPREXA) 5 MG tablet TAKE 1 TABLET(5 MG) BY MOUTH AT BEDTIME 01/27/22  Yes Lauree Chandler, NP  omeprazole (PRILOSEC) 20 MG capsule TAKE 1 CAPSULE(20 MG) BY MOUTH DAILY 11/01/21  Yes Lauree Chandler, NP  potassium chloride SA (KLOR-CON M) 20 MEQ tablet TAKE 2 TABLETS(40 MEQ) BY MOUTH TWICE DAILY 07/24/22  Yes Lauree Chandler, NP  torsemide (DEMADEX) 20 MG tablet TAKE 1/2 TABLET(10 MG) BY MOUTH DAILY 04/19/22  Yes Lauree Chandler, NP    Inpatient Medications: Scheduled Meds:  Continuous Infusions:  azithromycin     cefTRIAXone (ROCEPHIN)  IV     PRN Meds:   Allergies:    Allergies  Allergen Reactions   Aspirin Other (See Comments)    REACTION: upset stomach   Codeine Other (See Comments)    REACTION: "MAKES MY BODY GO CRAZY; CRAMPS"   Latex Nausea And Vomiting    Social History:   Social History   Socioeconomic History   Marital status: Widowed    Spouse name: Not on file   Number of children: 0   Years of education: Not on file   Highest education level: Not on file  Occupational History   Occupation: retired  Tobacco Use   Smoking status: Never   Smokeless tobacco: Never  Vaping Use   Vaping Use: Never used  Substance and Sexual Activity   Alcohol use: No   Drug use: No   Sexual activity: Not Currently  Other Topics Concern   Not on file  Social History Narrative   Diet? Good, low appetite      Do you drink/eat things with caffeine? no      Marital status?                  widowed                  What year were you married?      Do you live in a house, apartment, assisted living, condo, trailer, etc.? house      Is it one or more stories? 1 story      How many persons live in your home? Only me      Do you have any pets in your home? (please list) no      Current or past profession: lab tech      Do you exercise?        some                              Type & how often? Foot rolls, some  walking      Do you have a living will? yes      Do you have a  DNR form?    no                              If not, do you want to discuss one?      Do you have signed POA/HPOA for forms? yes   Social Determinants of Health   Financial Resource Strain: Low Risk  (03/29/2018)   Overall Financial Resource Strain (CARDIA)    Difficulty of Paying Living Expenses: Not hard at all  Food Insecurity: No Food Insecurity (03/29/2018)   Hunger Vital Sign    Worried About Running Out of Food in the Last Year: Never true    Ran Out of Food in the Last Year: Never true  Transportation Needs: No Transportation Needs (03/29/2018)   PRAPARE - Hydrologist (Medical): No    Lack of Transportation (Non-Medical): No  Physical Activity: Insufficiently Active (03/29/2018)   Exercise Vital Sign    Days of Exercise per Week: 7 days    Minutes of Exercise per Session: 10 min  Stress: No Stress Concern Present (03/29/2018)   [place]    Feeling of Stress : Not at all  Social Connections: Somewhat Isolated (03/29/2018)   Social Connection and Isolation Panel [NHANES]    Frequency of Communication with Friends and Family: More than three times a week    Frequency of Social Gatherings with Friends and Family: More than three times a week    Attends Religious Services: 1 to 4 times per year    Active Member of [employer] or Organizations: No    Attends Archivist Meetings: Never    Marital Status: Widowed  Intimate Partner Violence: Not At Risk (03/29/2018)   Humiliation, Afraid, Rape, and Kick questionnaire    Fear of Current or Ex-Partner: No    Emotionally Abused: No    Physically Abused: No    Sexually Abused: No    Family History:    Family History  Problem Relation Age of Onset   Diabetes Mother    Arthritis Father    Diabetes Paternal Grandfather        entire family on both sides   Heart disease  Maternal Aunt        entire family of both sides   Cancer Paternal Aunt        type unknown   Diabetes Sister    Breast cancer Cousin      ROS:  Please see the history of present illness.  All other ROS reviewed and negative.     Physical Exam/Data:   Vitals:   10/20/22 0530 10/20/22 0545 10/20/22 0548 10/20/22 0600  BP:    (!) 154/116  Pulse: 60 60  65  Resp: (!) 21 (!) 21  (!) 23  Temp:   98.6 F (37 C)   TempSrc:   Oral   SpO2: 100% 100%  100%  Weight:      Height:        Intake/Output Summary (Last 24 hours) at 10/20/2022 0748 Last data filed at 10/20/2022 0546 Gross per 24 hour  Intake 358.34 ml  Output --  Net 358.34 ml      10/19/2022   10:24 PM 09/15/2022    1:22 PM 05/19/2022   10:40 AM  Last 3 Weights  Weight (lbs) 135 lb 143 lb 141 lb  Weight (kg) 61.236 kg 64.864 kg 63.957 kg  Body mass index is 27.27 kg/m.  General:  Laying in bed, comfortable HEENT: normal Neck: JVD to mid neck Vascular: No carotid bruits; Distal pulses 2+ bilaterally Cardiac:  RRR, no murmurs Lungs:  Scattered rhonchi  Abd: soft, nontender, no hepatomegaly  Ext: 2+ LE edema with chronic venous stasis changes Musculoskeletal:  No deformities, BUE and BLE strength normal and equal Skin: warm and dry  Neuro:  Answering questions appropriately Psych:  Normal affect   EKG:  The EKG was personally reviewed and demonstrates:  NSR, subtle STD in inferolateral leads Telemetry:  Telemetry was personally reviewed and demonstrates:  NSR with occasional PACs  Relevant CV Studies: TTE 10/2019: IMPRESSIONS     1. Left ventricular ejection fraction, by visual estimation, is 55 to  60%. The left ventricle has low normal function. There is borderline left  ventricular hypertrophy.   2. Left ventricular diastolic parameters are consistent with Grade II  diastolic dysfunction (pseudonormalization).   3. The left ventricle has no regional wall motion abnormalities.   4. Global right  ventricle has normal systolic function.The right  ventricular size is normal. No increase in right ventricular wall  thickness.   5. Left atrial size was moderately dilated.   6. Right atrial size was moderately dilated.   7. Mild mitral annular calcification.   8. The mitral valve is normal in structure. Trivial mitral valve  regurgitation.   9. The tricuspid valve is normal in structure.  10. The tricuspid valve is normal in structure. Tricuspid valve  regurgitation is trivial.  11. The aortic valve is tricuspid. Aortic valve regurgitation is not  visualized. No evidence of aortic valve sclerosis or stenosis.  12. The pulmonic valve was grossly normal. Pulmonic valve regurgitation is  trivial.  13. Mildly elevated pulmonary artery systolic pressure.  14. The inferior vena cava is dilated in size with >50% respiratory  variability, suggesting right atrial pressure of 8 mmHg.   Laboratory Data:  High Sensitivity Troponin:   Recent Labs  Lab 10/19/22 2051 10/19/22 2236 10/20/22 0043  TROPONINIHS 141* 140* 141*     Chemistry Recent Labs  Lab 10/19/22 2051 10/19/22 2236  NA 144  --   K 3.9  --   CL 114*  --   CO2 21*  --   GLUCOSE 73  --   BUN 22  --   CREATININE 1.32*  --   CALCIUM 9.2  --   MG  --  1.9  GFRNONAA 39*  --   ANIONGAP 9  --     Recent Labs  Lab 10/19/22 2051  PROT 7.7  ALBUMIN 2.8*  AST 20  ALT 14  ALKPHOS 64  BILITOT 0.7   Lipids No results for input(s): "CHOL", "TRIG", "HDL", "LABVLDL", "LDLCALC", "CHOLHDL" in the last 168 hours.  Hematology Recent Labs  Lab 10/19/22 2051  WBC 6.6  RBC 3.23*  3.15*  HGB 8.5*  HCT 28.7*  MCV 88.9  MCH 26.3  MCHC 29.6*  RDW 14.5  PLT 249   Thyroid  Recent Labs  Lab 10/19/22 2051  TSH 1.355    BNP Recent Labs  Lab 10/19/22 2051  BNP 1,976.4*    DDimer No results for input(s): "DDIMER" in the last 168 hours.   Radiology/Studies:  CT HEAD WO CONTRAST (5MM)  Result Date:  10/19/2022 CLINICAL DATA:  Mental status change EXAM: CT HEAD WITHOUT CONTRAST TECHNIQUE: Contiguous axial images were obtained from the base of the skull through the vertex without intravenous contrast.  RADIATION DOSE REDUCTION: This exam was performed according to the departmental dose-optimization program which includes automated exposure control, adjustment of the mA and/or kV according to patient size and/or use of iterative reconstruction technique. COMPARISON:  CT brain 09/13/2022 FINDINGS: Brain: There is motion degradation. No acute territorial infarction, hemorrhage or intracranial mass. Atrophy and chronic small vessel ischemic changes of the white matter. Stable ventricle size. Vascular: No hyperdense vessels.  Carotid vascular calcification. Skull: Normal. Negative for fracture or focal lesion. Sinuses/Orbits: No acute finding. Other: None IMPRESSION: 1. Motion degraded exam. No definite CT evidence for acute intracranial abnormality. 2. Atrophy and chronic small vessel ischemic changes of the white matter. Electronically Signed   By: Donavan Foil M.D.   On: 10/19/2022 22:02   DG Chest Portable 1 View  Result Date: 10/19/2022 CLINICAL DATA:  Cough, shortness of breath, wheezing EXAM: PORTABLE CHEST 1 VIEW COMPARISON:  09/13/2022 FINDINGS: Mild retrocardiac opacity, suspicious for pneumonia. Cardiomegaly with pulmonary vascular congestion. No frank interstitial edema. No pleural effusion or pneumothorax. IMPRESSION: Mild retrocardiac opacity, suspicious for pneumonia. Electronically Signed   By: Julian Hy M.D.   On: 10/19/2022 21:21     Assessment and Plan:   #Acute on Chronic Combined Systolic and Diastolic HF Exacerbation with Improved EF: Patient with history of systolic HF with LVEF 02-11% in 2015. Follow-up nuc with normal perfusion and EF recovered on subsequent TTEs in 2018 and 2021 to 55-60%. Presented on this admission with worsening SOB and wheezing found to have CAP, COPD  exacerbation and acute on chronic HF exacerbation. BNP 1900. Has not been started on diuretics. -Continue lasix '40mg'$  IV daily and monitor response -Resume hydralazine '50mg'$  BID -Start imdur '30mg'$  daily for afterload reduction -BB held due to acute COPD exacerbation with active wheezing; resume as able -TTE pending -Not on ACE/ARB/spiro due to CKD IIIB -Monitor I/Os and daily weights  #Community Acquired PNA: Noted on CXR. On ABX per primary. -Management per primary  #Acute COPD Exacerbation: -Started on steroids -Management per primary  #Elevated Trop: Demand in the setting of PNA, acute COPD exacerbation and CHF. -Management as above  #HTN: Very elevated on arrival. -Continue hydralazine '50mg'$  BID  #CKD IIIB: -Trend with diuresis  #MGUS: -Per primary  #Dementia: -Per primary   Risk Assessment/Risk Scores:      New York Heart Association (NYHA) Functional Class NYHA Class III        For questions or updates, please contact [place] Please consult [email or web address] for contact info under    Signed, Freada Bergeron, MD  10/20/2022 7:48 AM

## 2022-10-20 NOTE — Progress Notes (Signed)
PROGRESS NOTE    [name]  HCW:[id number] DOB: 25-Mar-1932 DOA: 10/19/2022 PCP: Lauree Chandler, NP     Brief Narrative:  [name] is an 88 year old female with past medical history significant for dementia, GERD, HTN, CKD, who present to the hospital with confusion. Per family, they reported that patient was not at her baseline and not getting up as normal.  In the emergency department, she was found to have wheezing.  She was started on steroids as well as empiric antibiotics.  She had elevated BNP and was also ordered for Lasix and cardiology consulted for CHF exacerbation.  New events last 24 hours / Subjective: Patient seen in the emergency department.  She is alert and oriented to self only.  She remained stable on room air.  She is a poor historian due to her underlying dementia.  Assessment & Plan:   Principal Problem:   CAP (community acquired pneumonia) Active Problems:   Essential hypertension   MGUS (monoclonal gammopathy of unknown significance)   CKD (chronic kidney disease) stage 3, GFR 30-59 ml/min (HCC)   COPD with acute exacerbation (HCC)   Acute on chronic combined systolic (congestive) and diastolic (congestive) heart failure (HCC)   Dementia with behavioral disturbance (HCC)   Normocytic anemia   Acute metabolic encephalopathy   Elevated troponin   Acute metabolic encephalopathy -Insetting of underlying dementia, pneumonia, heart failure -Zyprexa   CAP -COVID, influenza, RVP negative -Strep pneumo antigen negative -Rocephin, azithromycin -Procalcitonin trending downward  COPD exacerbation -Solu-Medrol, de-escalate to prednisone  Acute on chronic combined systolic and diastolic heart failure -IV Lasix, hydralazine, Imdur -Cardiology consulted -Echocardiogram  Hypertension -Hydralazine  CKD stage IIIb -Baseline creatinine 1.3 -Stable  Demand ischemia -Insetting of pneumonia, COPD, CHF    DVT prophylaxis:  SCDs Start:  10/20/22 0819  Code Status: Full Family Communication: None at bedside  Disposition Plan:  Status is: Inpatient Remains inpatient appropriate because: IV lasix, IV antibiotics    Antimicrobials:  Anti-infectives (From admission, onward)    Start     Dose/Rate Route Frequency Ordered Stop   10/20/22 2300  cefTRIAXone (ROCEPHIN) 2 g in sodium chloride 0.9 % 100 mL IVPB        2 g 200 mL/hr over 30 Minutes Intravenous Every 24 hours 10/19/22 2338 10/25/22 2259   10/20/22 2300  azithromycin (ZITHROMAX) 500 mg in sodium chloride 0.9 % 250 mL IVPB        500 mg 250 mL/hr over 60 Minutes Intravenous Every 24 hours 10/19/22 2338 10/25/22 2259   10/19/22 2300  cefTRIAXone (ROCEPHIN) 1 g in sodium chloride 0.9 % 100 mL IVPB        1 g 200 mL/hr over 30 Minutes Intravenous  Once 10/19/22 2255 10/20/22 0048   10/19/22 2300  azithromycin (ZITHROMAX) 500 mg in sodium chloride 0.9 % 250 mL IVPB        500 mg 250 mL/hr over 60 Minutes Intravenous  Once 10/19/22 2255 10/20/22 0546        Objective: Vitals:   10/20/22 0930 10/20/22 0953 10/20/22 1030 10/20/22 1215  BP: (!) 64/25  (!) 146/86 (!) 148/115  Pulse: 60  61 63  Resp: 20     Temp:  (!) 97.5 F (36.4 C) 97.9 F (36.6 C)   TempSrc:  Oral    SpO2: 100%  100% 97%  Weight:      Height:        Intake/Output Summary (Last 24 hours) at 10/20/2022  [place] filed at 10/20/2022 0546 Gross per 24 hour  Intake 358.34 ml  Output --  Net 358.34 ml   Filed Weights   10/19/22 2224  Weight: 61.2 kg    Examination:  General exam: Appears calm and comfortable  Respiratory system: +mild expiratory wheezes, on room air  Cardiovascular system: S1 & S2 heard, RRR. No murmurs. + pedal edema. Gastrointestinal system: Abdomen is nondistended, soft and nontender. Normal bowel sounds heard. Central nervous system: Alert and oriented to self only  Extremities: Symmetric in appearance  Skin: +chronic venous stasis   Data Reviewed: I  have personally reviewed following labs and imaging studies  CBC: Recent Labs  Lab 10/19/22 2051 10/20/22 1040  WBC 6.6 7.4  NEUTROABS 4.8  --   HGB 8.5* 9.1*  HCT 28.7* 30.9*  MCV 88.9 88.5  PLT 249 967   Basic Metabolic Panel: Recent Labs  Lab 10/19/22 2051 10/19/22 2236 10/20/22 1040  NA 144  --  142  K 3.9  --  4.2  CL 114*  --  111  CO2 21*  --  19*  GLUCOSE 73  --  133*  BUN 22  --  24*  CREATININE 1.32*  --  1.53*  CALCIUM 9.2  --  9.1  MG  --  1.9 2.2  PHOS  --  3.6 5.1*   GFR: Estimated Creatinine Clearance: 20.2 mL/min (A) (by C-G formula based on SCr of 1.53 mg/dL (H)). Liver Function Tests: Recent Labs  Lab 10/19/22 2051 10/20/22 1040  AST 20 20  ALT 14 13  ALKPHOS 64 57  BILITOT 0.7 0.7  PROT 7.7 8.0  ALBUMIN 2.8* 2.8*   No results for input(s): "LIPASE", "AMYLASE" in the last 168 hours. Recent Labs  Lab 10/20/22 0043  AMMONIA 15   Coagulation Profile: No results for input(s): "INR", "PROTIME" in the last 168 hours. Cardiac Enzymes: Recent Labs  Lab 10/19/22 2236  CKTOTAL 250*   BNP (last 3 results) No results for input(s): "PROBNP" in the last 8760 hours. HbA1C: No results for input(s): "HGBA1C" in the last 72 hours. CBG: No results for input(s): "GLUCAP" in the last 168 hours. Lipid Profile: No results for input(s): "CHOL", "HDL", "LDLCALC", "TRIG", "CHOLHDL", "LDLDIRECT" in the last 72 hours. Thyroid Function Tests: Recent Labs    10/19/22 2051  TSH 1.355   Anemia Panel: Recent Labs    10/19/22 2051 10/20/22 0043  VITAMINB12  --  722  FOLATE  --  15.3  FERRITIN  --  235  TIBC  --  206*  IRON  --  20*  RETICCTPCT 1.9  --    Sepsis Labs: Recent Labs  Lab 10/19/22 2051 10/19/22 2217 10/19/22 2236 10/20/22 1040  PROCALCITON  --   --  2.43 1.29  LATICACIDVEN 1.5 1.5  --   --     Recent Results (from the past 240 hour(s))  Resp panel by RT-PCR (RSV, Flu A&B, Covid) Anterior Nasal Swab     Status: None    Collection Time: 10/19/22  8:51 PM   Specimen: Anterior Nasal Swab  Result Value Ref Range Status   SARS Coronavirus 2 by RT PCR NEGATIVE NEGATIVE Final    Comment: (NOTE) SARS-CoV-2 target nucleic acids are NOT DETECTED.  The SARS-CoV-2 RNA is generally detectable in upper respiratory specimens during the acute phase of infection. The lowest concentration of SARS-CoV-2 viral copies this assay can detect is 138 copies/mL. A negative result does not preclude SARS-Cov-2 infection and  should not be used as the sole basis for treatment or other patient management decisions. A negative result may occur with  improper specimen collection/handling, submission of specimen other than nasopharyngeal swab, presence of viral mutation(s) within the areas targeted by this assay, and inadequate number of viral copies(<138 copies/mL). A negative result must be combined with clinical observations, patient history, and epidemiological information. The expected result is Negative.  Fact Sheet for Patients:  EntrepreneurPulse.com.au  Fact Sheet for Healthcare Providers:  IncredibleEmployment.be  This test is no t yet approved or cleared by the [place] FDA and  has been authorized for detection and/or diagnosis of SARS-CoV-2 by FDA under an Emergency Use Authorization (EUA). This EUA will remain  in effect (meaning this test can be used) for the duration of the COVID-19 declaration under Section 564(b)(1) of the Act, 21 U.S.C.section 360bbb-3(b)(1), unless the authorization is terminated  or revoked sooner.       Influenza A by PCR NEGATIVE NEGATIVE Final   Influenza B by PCR NEGATIVE NEGATIVE Final    Comment: (NOTE) The Xpert Xpress SARS-CoV-2/FLU/RSV plus assay is intended as an aid in the diagnosis of influenza from Nasopharyngeal swab specimens and should not be used as a sole basis for treatment. Nasal washings and aspirates are unacceptable for  Xpert Xpress SARS-CoV-2/FLU/RSV testing.  Fact Sheet for Patients: EntrepreneurPulse.com.au  Fact Sheet for Healthcare Providers: IncredibleEmployment.be  This test is not yet approved or cleared by the [place] FDA and has been authorized for detection and/or diagnosis of SARS-CoV-2 by FDA under an Emergency Use Authorization (EUA). This EUA will remain in effect (meaning this test can be used) for the duration of the COVID-19 declaration under Section 564(b)(1) of the Act, 21 U.S.C. section 360bbb-3(b)(1), unless the authorization is terminated or revoked.     Resp Syncytial Virus by PCR NEGATIVE NEGATIVE Final    Comment: (NOTE) Fact Sheet for Patients: EntrepreneurPulse.com.au  Fact Sheet for Healthcare Providers: IncredibleEmployment.be  This test is not yet approved or cleared by the [place] FDA and has been authorized for detection and/or diagnosis of SARS-CoV-2 by FDA under an Emergency Use Authorization (EUA). This EUA will remain in effect (meaning this test can be used) for the duration of the COVID-19 declaration under Section 564(b)(1) of the Act, 21 U.S.C. section 360bbb-3(b)(1), unless the authorization is terminated or revoked.  Performed at [facility], [place] [address] [place], [place] [address]   Blood culture (routine x 2)     Status: None (Preliminary result)   Collection Time: 10/19/22 10:15 PM   Specimen: BLOOD  Result Value Ref Range Status   Specimen Description   Final    BLOOD LEFT ANTECUBITAL Performed at [facility] [address], [place], [place] [address]    Special Requests   Final    BOTTLES DRAWN AEROBIC AND ANAEROBIC Blood Culture results may not be optimal due to an inadequate volume of blood received in culture bottles Performed at [facility] [address] [place], [place] [address]     Culture   Final    NO GROWTH < 12 HOURS Performed at [facility] [address] [place], [place] [address]    Report Status PENDING  Incomplete  Blood culture (routine x 2)     Status: None (Preliminary result)   Collection Time: 10/19/22 10:17 PM   Specimen: BLOOD  Result Value Ref Range Status   Specimen Description   Final  BLOOD RIGHT ANTECUBITAL Performed at [facility] [address], [place], [place] [address]    Special Requests   Final    BOTTLES DRAWN AEROBIC AND ANAEROBIC Blood Culture adequate volume Performed at [facility] [address], [place], [place] [address]    Culture   Final    NO GROWTH < 12 HOURS Performed at [facility] [address] [place], [place] [address]    Report Status PENDING  Incomplete  Expectorated Sputum Assessment w Gram Stain, Rflx to Resp Cult     Status: None   Collection Time: 10/19/22 10:36 PM   Specimen: Expectorated Sputum  Result Value Ref Range Status   Specimen Description EXPECTORATED SPUTUM  Final   Special Requests NONE  Final   Sputum evaluation   Final    THIS SPECIMEN IS ACCEPTABLE FOR SPUTUM CULTURE Performed at [facility], [place] [address], [place], [place] [address]    Report Status 10/19/2022 FINAL  Final  Culture, Respiratory w Gram Stain     Status: None (Preliminary result)   Collection Time: 10/19/22 10:36 PM  Result Value Ref Range Status   Specimen Description   Final    EXPECTORATED SPUTUM Performed at [facility] [address] [place], [place] [address]    Special Requests   Final    NONE Reflexed from [id number] Performed at [facility], [place] [address] [place], [place] [address]    Gram Stain   Final    FEW SQUAMOUS EPITHELIAL CELLS PRESENT MODERATE WBC PRESENT,BOTH PMN AND MONONUCLEAR MODERATE GRAM NEGATIVE RODS MODERATE GRAM POSITIVE COCCI IN PAIRS Performed at [facility], [place] [address] [place], [place] [address]    Culture PENDING  Incomplete   Report Status PENDING  Incomplete  Respiratory (~20 pathogens) panel by PCR     Status: None   Collection Time: 10/20/22 12:43 AM   Specimen: Nasopharyngeal Swab; Respiratory  Result Value Ref Range Status   Adenovirus NOT DETECTED NOT DETECTED Final   Coronavirus 229E NOT DETECTED NOT DETECTED Final    Comment: (NOTE) The Coronavirus on the Respiratory Panel, DOES NOT test for the novel  Coronavirus (2019 nCoV)    Coronavirus HKU1 NOT DETECTED NOT DETECTED Final   Coronavirus NL63 NOT DETECTED NOT DETECTED Final   Coronavirus OC43 NOT DETECTED NOT DETECTED Final   Metapneumovirus NOT DETECTED NOT DETECTED Final   Rhinovirus / Enterovirus NOT DETECTED NOT DETECTED Final   Influenza A NOT DETECTED NOT DETECTED Final   Influenza B NOT DETECTED NOT DETECTED Final   Parainfluenza Virus 1 NOT DETECTED NOT DETECTED Final   Parainfluenza Virus 2 NOT DETECTED NOT DETECTED Final   Parainfluenza Virus 3 NOT DETECTED NOT DETECTED Final   Parainfluenza Virus 4 NOT DETECTED NOT DETECTED Final   Respiratory Syncytial Virus NOT DETECTED NOT DETECTED Final   Bordetella pertussis NOT DETECTED NOT DETECTED Final   Bordetella Parapertussis NOT DETECTED NOT DETECTED Final   Chlamydophila pneumoniae NOT DETECTED NOT DETECTED Final   Mycoplasma pneumoniae NOT DETECTED NOT DETECTED Final    Comment: Performed at [facility], [place]. [address], [place],  [address]      Radiology Studies: VAS [place] LOWER EXTREMITY VENOUS (DVT)  Result Date: 10/20/2022  Lower Venous DVT Study Patient Name:  RYLANN MUNFORD  Date of Exam:   10/20/2022 Medical Rec #: [id number]         Accession #:  [id number] Date of Birth: [birth year]         Patient Gender: F Patient Age:   64 years Exam Location:  [facility] Procedure:      VAS [place] LOWER EXTREMITY VENOUS (DVT) Referring Phys: Nyoka Lint DOUTOVA  --------------------------------------------------------------------------------  Indications: Edema.  Risk Factors: None identified. Limitations: Poor ultrasound/tissue interface and patient positioning, patient immobility. Comparison Study: No prior studies. Performing Technologist: Oliver Hum RVT  Examination Guidelines: A complete evaluation includes B-mode imaging, spectral Doppler, color Doppler, and power Doppler as needed of all accessible portions of each vessel. Bilateral testing is considered an integral part of a complete examination. Limited examinations for reoccurring indications may be performed as noted. The reflux portion of the exam is performed with the patient in reverse Trendelenburg.  +---------+---------------+---------+-----------+----------+--------------+ RIGHT    CompressibilityPhasicitySpontaneityPropertiesThrombus Aging +---------+---------------+---------+-----------+----------+--------------+ CFV      Full           Yes      Yes                                 +---------+---------------+---------+-----------+----------+--------------+ SFJ      Full                                                        +---------+---------------+---------+-----------+----------+--------------+ FV Prox  Full                                                        +---------+---------------+---------+-----------+----------+--------------+ FV Mid   Full                                                        +---------+---------------+---------+-----------+----------+--------------+ FV Distal               Yes      Yes                                 +---------+---------------+---------+-----------+----------+--------------+ PFV      Full                                                        +---------+---------------+---------+-----------+----------+--------------+ POP      Full           Yes      Yes                                  +---------+---------------+---------+-----------+----------+--------------+ PTV      Full                                                        +---------+---------------+---------+-----------+----------+--------------+  PERO     Full                                                        +---------+---------------+---------+-----------+----------+--------------+   +---------+---------------+---------+-----------+----------+--------------+ LEFT     CompressibilityPhasicitySpontaneityPropertiesThrombus Aging +---------+---------------+---------+-----------+----------+--------------+ CFV      Full           Yes      Yes                                 +---------+---------------+---------+-----------+----------+--------------+ SFJ      Full                                                        +---------+---------------+---------+-----------+----------+--------------+ FV Prox  Full                                                        +---------+---------------+---------+-----------+----------+--------------+ FV Mid   Full                                                        +---------+---------------+---------+-----------+----------+--------------+ FV Distal               Yes      Yes                                 +---------+---------------+---------+-----------+----------+--------------+ PFV      Full                                                        +---------+---------------+---------+-----------+----------+--------------+ POP      Full           Yes      Yes                                 +---------+---------------+---------+-----------+----------+--------------+ PTV      Full                                                        +---------+---------------+---------+-----------+----------+--------------+ PERO     Full                                                         +---------+---------------+---------+-----------+----------+--------------+  Summary: RIGHT: - There is no evidence of deep vein thrombosis in the lower extremity. However, portions of this examination were limited- see technologist comments above.  - No cystic structure found in the popliteal fossa.  LEFT: - There is no evidence of deep vein thrombosis in the lower extremity. However, portions of this examination were limited- see technologist comments above.  - No cystic structure found in the popliteal fossa.  *See table(s) above for measurements and observations.    Preliminary    CT HEAD WO CONTRAST (5MM)  Result Date: 10/19/2022 CLINICAL DATA:  Mental status change EXAM: CT HEAD WITHOUT CONTRAST TECHNIQUE: Contiguous axial images were obtained from the base of the skull through the vertex without intravenous contrast. RADIATION DOSE REDUCTION: This exam was performed according to the departmental dose-optimization program which includes automated exposure control, adjustment of the mA and/or kV according to patient size and/or use of iterative reconstruction technique. COMPARISON:  CT brain 09/13/2022 FINDINGS: Brain: There is motion degradation. No acute territorial infarction, hemorrhage or intracranial mass. Atrophy and chronic small vessel ischemic changes of the white matter. Stable ventricle size. Vascular: No hyperdense vessels.  Carotid vascular calcification. Skull: Normal. Negative for fracture or focal lesion. Sinuses/Orbits: No acute finding. Other: None IMPRESSION: 1. Motion degraded exam. No definite CT evidence for acute intracranial abnormality. 2. Atrophy and chronic small vessel ischemic changes of the white matter. Electronically Signed   By: Donavan Foil M.D.   On: 10/19/2022 22:02   DG Chest Portable 1 View  Result Date: 10/19/2022 CLINICAL DATA:  Cough, shortness of breath, wheezing EXAM: PORTABLE CHEST 1 VIEW COMPARISON:  09/13/2022 FINDINGS: Mild retrocardiac opacity,  suspicious for pneumonia. Cardiomegaly with pulmonary vascular congestion. No frank interstitial edema. No pleural effusion or pneumothorax. IMPRESSION: Mild retrocardiac opacity, suspicious for pneumonia. Electronically Signed   By: Julian Hy M.D.   On: 10/19/2022 21:21      Scheduled Meds:  furosemide  40 mg Intravenous Daily   guaiFENesin  600 mg Oral BID   hydrALAZINE  50 mg Oral BID   isosorbide mononitrate  30 mg Oral Daily   methylPREDNISolone (SOLU-MEDROL) injection  40 mg Intravenous Q12H   Followed by   Derrill Memo ON 10/21/2022] predniSONE  40 mg Oral Q breakfast   OLANZapine  5 mg Oral QHS   pantoprazole  40 mg Oral Daily   sodium chloride flush  3 mL Intravenous Q12H   Continuous Infusions:  sodium chloride     azithromycin     cefTRIAXone (ROCEPHIN)  IV       LOS: 1 day   Time spent: 25 minutes   Sophia Phi, DO [facility] 10/20/2022, 1:31 PM   Available via Epic secure chat 7am-7pm After these hours, please refer to coverage provider listed on [email or web address]

## 2022-10-20 NOTE — Congregational Nurse Program (Signed)
nephew [name] is requesting patient to get TB tested before being admitted to [facility]

## 2022-10-20 NOTE — Plan of Care (Signed)
  Problem: Education: Goal: Ability to demonstrate management of disease process will improve Outcome: Progressing Goal: Ability to verbalize understanding of medication therapies will improve Outcome: Progressing Goal: Individualized Educational Video(s) Outcome: Progressing   Problem: Activity: Goal: Capacity to carry out activities will improve Outcome: Progressing   Problem: Cardiac: Goal: Ability to achieve and maintain adequate cardiopulmonary perfusion will improve Outcome: Progressing   Problem: Education: Goal: Knowledge of disease or condition will improve Outcome: Progressing Goal: Knowledge of the prescribed therapeutic regimen will improve Outcome: Progressing Goal: Individualized Educational Video(s) Outcome: Progressing   Problem: Activity: Goal: Ability to tolerate increased activity will improve Outcome: Progressing Goal: Will verbalize the importance of balancing activity with adequate rest periods Outcome: Progressing   Problem: Respiratory: Goal: Ability to maintain a clear airway will improve Outcome: Progressing Goal: Levels of oxygenation will improve Outcome: Progressing Goal: Ability to maintain adequate ventilation will improve Outcome: Progressing   Problem: Education: Goal: Knowledge of General Education information will improve Description: Including pain rating scale, medication(s)/side effects and non-pharmacologic comfort measures Outcome: Progressing   Problem: Health Behavior/Discharge Planning: Goal: Ability to manage health-related needs will improve Outcome: Progressing   Problem: Clinical Measurements: Goal: Ability to maintain clinical measurements within normal limits will improve Outcome: Progressing Goal: Will remain free from infection Outcome: Progressing Goal: Diagnostic test results will improve Outcome: Progressing Goal: Respiratory complications will improve Outcome: Progressing Goal: Cardiovascular complication  will be avoided Outcome: Progressing   Problem: Activity: Goal: Risk for activity intolerance will decrease Outcome: Progressing   Problem: Nutrition: Goal: Adequate nutrition will be maintained Outcome: Progressing   Problem: Coping: Goal: Level of anxiety will decrease Outcome: Progressing   Problem: Elimination: Goal: Will not experience complications related to bowel motility Outcome: Progressing Goal: Will not experience complications related to urinary retention Outcome: Progressing   Problem: Pain Managment: Goal: General experience of comfort will improve Outcome: Progressing   Problem: Safety: Goal: Ability to remain free from injury will improve Outcome: Progressing   Problem: Skin Integrity: Goal: Risk for impaired skin integrity will decrease Outcome: Progressing   Problem: Education: Goal: Knowledge of General Education information will improve Description: Including pain rating scale, medication(s)/side effects and non-pharmacologic comfort measures Outcome: Progressing   Problem: Health Behavior/Discharge Planning: Goal: Ability to manage health-related needs will improve Outcome: Progressing   Problem: Clinical Measurements: Goal: Ability to maintain clinical measurements within normal limits will improve Outcome: Progressing Goal: Will remain free from infection Outcome: Progressing Goal: Diagnostic test results will improve Outcome: Progressing Goal: Respiratory complications will improve Outcome: Progressing Goal: Cardiovascular complication will be avoided Outcome: Progressing   Problem: Activity: Goal: Risk for activity intolerance will decrease Outcome: Progressing   Problem: Nutrition: Goal: Adequate nutrition will be maintained Outcome: Progressing   Problem: Elimination: Goal: Will not experience complications related to bowel motility Outcome: Progressing Goal: Will not experience complications related to urinary  retention Outcome: Progressing   Problem: Pain Managment: Goal: General experience of comfort will improve Outcome: Progressing   Problem: Safety: Goal: Ability to remain free from injury will improve Outcome: Progressing   Problem: Skin Integrity: Goal: Risk for impaired skin integrity will decrease Outcome: Progressing

## 2022-10-20 NOTE — Evaluation (Signed)
Physical Therapy Evaluation Patient Details Name: [name] MRN: [id number] DOB: Oct 01, 1932 Today's Date: 10/20/2022  History of Present Illness  Patient is an 88 y/o female admitted with AMS, weakness and found to have elevated BNP and wheezing.  Admitted for CHF exacerbation and CAP.  PMH significant for dementia, GERD, HTN, CKD, COPD.  Clinical Impression  Patient presents with decreased mobility due to deficits listed in PT problem list.  Current baseline unknown as no family present and pt is poor historian.  Feel she may benefit from skilled PT in the acute setting and possibly HHPT at d/c if below her baseline for mobility.  Today she walked to the bathroom and back to bed with minimal A with RW and needed mod A for bed mobility.        Recommendations for follow up therapy are one component of a multi-disciplinary discharge planning process, led by the attending physician.  Recommendations may be updated based on patient status, additional functional criteria and insurance authorization.  Follow Up Recommendations Home health PT      Assistance Recommended at Discharge Frequent or constant Supervision/Assistance  Patient can return home with the following  A little help with walking and/or transfers;A lot of help with bathing/dressing/bathroom;Assist for transportation;Help with stairs or ramp for entrance    Equipment Recommendations BSC/3in1;Wheelchair (measurements PT);Wheelchair cushion (measurements PT)  Recommendations for Other Services       Functional Status Assessment Patient has had a recent decline in their functional status and demonstrates the ability to make significant improvements in function in a reasonable and predictable amount of time.     Precautions / Restrictions Precautions Precautions: Fall      Mobility  Bed Mobility Overal bed mobility: Needs Assistance Bed Mobility: Supine to Sit, Sit to Supine     Supine to sit: Mod assist, HOB  elevated Sit to supine: Mod assist   General bed mobility comments: assist for legs off EOB and to lift trunk, pt helping some to scoot to EOB once upright and given increased time, to supine assist for legs onto bed    Transfers Overall transfer level: Needs assistance Equipment used: Rolling walker (2 wheels) Transfers: Sit to/from Stand Sit to Stand: Min assist           General transfer comment: increased time to rise, A for balance, anterior weight shift    Ambulation/Gait Ambulation/Gait assistance: Min assist Gait Distance (Feet): 12 Feet (x 2) Assistive device: Rolling walker (2 wheels) Gait Pattern/deviations: Step-to pattern, Trunk flexed, Decreased stride length, Decreased dorsiflexion - right, Decreased dorsiflexion - left, Shuffle       General Gait Details: very shortened stride length, increased time, cues for posture, assist for balance  Stairs            Wheelchair Mobility    Modified Rankin (Stroke Patients Only)       Balance Overall balance assessment: Needs assistance Sitting-balance support: Feet supported Sitting balance-Leahy Scale: Good     Standing balance support: Bilateral upper extremity supported Standing balance-Leahy Scale: Poor                               Pertinent Vitals/Pain Pain Assessment Pain Assessment: Faces Faces Pain Scale: Hurts little more Pain Location: tender on LE's Pain Descriptors / Indicators: Tender Pain Intervention(s): Monitored during session, Repositioned    Home Living Family/patient expects to be discharged to:: Private residence  Additional Comments: patient is a poor historian, chart seems to indicate she has family to assist her, pt reports she lives alone and completes most ADL/IADL's she has dementia    Prior Function                       Hand Dominance        Extremity/Trunk Assessment   Upper Extremity Assessment Upper Extremity  Assessment: Generalized weakness    Lower Extremity Assessment Lower Extremity Assessment: Generalized weakness    Cervical / Trunk Assessment Cervical / Trunk Assessment: Kyphotic  Communication   Communication: No difficulties  Cognition Arousal/Alertness: Awake/alert Behavior During Therapy: WFL for tasks assessed/performed Overall Cognitive Status: No family/caregiver present to determine baseline cognitive functioning                                 General Comments: did not recall she was in that bed when walking back to it, could not sit on toilet or BSC over toilet despite assistance, cues, increased time, etc.  But sat on EOB Just fine        General Comments General comments (skin integrity, edema, etc.): attempted toileting in bathroom, but pt unable to sit on toilet    Exercises     Assessment/Plan    PT Assessment Patient needs continued PT services  PT Problem List Decreased activity tolerance;Decreased mobility;Decreased balance;Decreased strength       PT Treatment Interventions DME instruction;Functional mobility training;Balance training;Patient/family education;Therapeutic activities;Therapeutic exercise;Gait training    PT Goals (Current goals can be found in the Care Plan section)  Acute Rehab PT Goals PT Goal Formulation: Patient unable to participate in goal setting Time For Goal Achievement: 11/03/22 Potential to Achieve Goals: Fair    Frequency Min 3X/week     Co-evaluation               AM-PAC PT "6 Clicks" Mobility  Outcome Measure Help needed turning from your back to your side while in a flat bed without using bedrails?: A Lot Help needed moving from lying on your back to sitting on the side of a flat bed without using bedrails?: A Lot Help needed moving to and from a bed to a chair (including a wheelchair)?: A Lot Help needed standing up from a chair using your arms (e.g., wheelchair or bedside chair)?: A Lot Help  needed to walk in hospital room?: Total Help needed climbing 3-5 steps with a railing? : Total 6 Click Score: 10    End of Session Equipment Utilized During Treatment: Gait belt Activity Tolerance: Patient limited by fatigue Patient left: in bed;with call bell/phone within reach;with bed alarm set   PT Visit Diagnosis: Other abnormalities of gait and mobility (R26.89);Muscle weakness (generalized) (M62.81);Other symptoms and signs involving the nervous system (R29.898)    Time: 1430-1502 PT Time Calculation (min) (ACUTE ONLY): 32 min   Charges:   PT Evaluation $PT Eval Moderate Complexity: 1 Mod PT Treatments $Gait Training: 8-22 mins        Magda Kiel, PT Acute Rehabilitation Services [phone] 10/20/2022   Reginia Naas 10/20/2022, 5:12 PM

## 2022-10-20 NOTE — Progress Notes (Signed)
Bilateral lower extremity venous duplex has been completed. Preliminary results can be found in CV Proc through chart review.   10/20/22 9:15 AM Carlos Levering RVT

## 2022-10-20 NOTE — Progress Notes (Signed)
  Echocardiogram 2D Echocardiogram has been performed.  [name] 10/20/2022, 3:40 PM

## 2022-10-21 DIAGNOSIS — I5030 Unspecified diastolic (congestive) heart failure: Secondary | ICD-10-CM

## 2022-10-21 DIAGNOSIS — J189 Pneumonia, unspecified organism: Secondary | ICD-10-CM | POA: Diagnosis not present

## 2022-10-21 LAB — BLOOD CULTURE ID PANEL (REFLEXED) - BCID2

## 2022-10-21 LAB — BASIC METABOLIC PANEL
Anion gap: 11 (ref 5–15)
BUN: 37 mg/dL — ABNORMAL HIGH (ref 8–23)
CO2: 20 mmol/L — ABNORMAL LOW (ref 22–32)
Calcium: 9.2 mg/dL (ref 8.9–10.3)
Chloride: 110 mmol/L (ref 98–111)
Creatinine, Ser: 1.61 mg/dL — ABNORMAL HIGH (ref 0.44–1.00)
GFR, Estimated: 31 mL/min — ABNORMAL LOW (ref >60)
Glucose, Bld: 198 mg/dL — ABNORMAL HIGH (ref 70–99)
Potassium: 4 mmol/L (ref 3.5–5.1)
Sodium: 141 mmol/L (ref 135–145)

## 2022-10-21 MED ORDER — AZITHROMYCIN 250 MG PO TABS
500.0000 mg | ORAL_TABLET | Freq: Every day | ORAL | Status: AC
  Administered 2022-10-21 – 2022-10-23 (×3): 500 mg via ORAL
  Filled 2022-10-21 (×3): qty 2

## 2022-10-21 MED ORDER — HALOPERIDOL LACTATE 5 MG/ML IJ SOLN
2.0000 mg | Freq: Once | INTRAMUSCULAR | Status: AC
  Administered 2022-10-21: 2 mg via INTRAVENOUS
  Filled 2022-10-21: qty 1

## 2022-10-21 MED ORDER — HALOPERIDOL LACTATE 5 MG/ML IJ SOLN
2.0000 mg | Freq: Four times a day (QID) | INTRAMUSCULAR | Status: DC | PRN
  Administered 2022-10-24: 2 mg via INTRAVENOUS
  Filled 2022-10-21: qty 1

## 2022-10-21 NOTE — Progress Notes (Signed)
       CROSS COVER NOTE  NAME: [name] MRN: [id number] DOB : 08/16/1932    Date of Service   10/21/2022   HPI/Events of Note   Bedside RN reports that patient has been pleasantly confused for most of the night.  However this morning, she is combative (swinging at nursing staff) and is unable to be redirected.  Patient is also "throwing her legs over the bed rail."     Interventions/ Plan   2 mg IV Haldol ordered.     Raenette Rover, DNP, [place]

## 2022-10-21 NOTE — Plan of Care (Signed)
  Problem: Education: Goal: Ability to demonstrate management of disease process will improve Outcome: Progressing   Problem: Activity: Goal: Capacity to carry out activities will improve Outcome: Progressing   Problem: Cardiac: Goal: Ability to achieve and maintain adequate cardiopulmonary perfusion will improve Outcome: Progressing   Problem: Activity: Goal: Ability to tolerate increased activity will improve Outcome: Progressing   Problem: Respiratory: Goal: Ability to maintain a clear airway will improve Outcome: Progressing

## 2022-10-21 NOTE — Plan of Care (Signed)
  Problem: Education: Goal: Ability to demonstrate management of disease process will improve Outcome: Progressing Goal: Ability to verbalize understanding of medication therapies will improve Outcome: Progressing Goal: Individualized Educational Video(s) Outcome: Progressing   Problem: Activity: Goal: Capacity to carry out activities will improve Outcome: Progressing   Problem: Cardiac: Goal: Ability to achieve and maintain adequate cardiopulmonary perfusion will improve Outcome: Progressing   Problem: Education: Goal: Knowledge of disease or condition will improve Outcome: Progressing Goal: Knowledge of the prescribed therapeutic regimen will improve Outcome: Progressing Goal: Individualized Educational Video(s) Outcome: Progressing   Problem: Activity: Goal: Ability to tolerate increased activity will improve Outcome: Progressing Goal: Will verbalize the importance of balancing activity with adequate rest periods Outcome: Progressing   Problem: Respiratory: Goal: Ability to maintain a clear airway will improve Outcome: Progressing Goal: Levels of oxygenation will improve Outcome: Progressing Goal: Ability to maintain adequate ventilation will improve Outcome: Progressing   Problem: Education: Goal: Knowledge of General Education information will improve Description: Including pain rating scale, medication(s)/side effects and non-pharmacologic comfort measures Outcome: Progressing   Problem: Health Behavior/Discharge Planning: Goal: Ability to manage health-related needs will improve Outcome: Progressing   Problem: Clinical Measurements: Goal: Ability to maintain clinical measurements within normal limits will improve Outcome: Progressing Goal: Will remain free from infection Outcome: Progressing Goal: Diagnostic test results will improve Outcome: Progressing Goal: Respiratory complications will improve Outcome: Progressing Goal: Cardiovascular complication  will be avoided Outcome: Progressing   Problem: Activity: Goal: Risk for activity intolerance will decrease Outcome: Progressing   Problem: Nutrition: Goal: Adequate nutrition will be maintained Outcome: Progressing   Problem: Coping: Goal: Level of anxiety will decrease Outcome: Progressing   Problem: Elimination: Goal: Will not experience complications related to bowel motility Outcome: Progressing Goal: Will not experience complications related to urinary retention Outcome: Progressing   Problem: Pain Managment: Goal: General experience of comfort will improve Outcome: Progressing   Problem: Safety: Goal: Ability to remain free from injury will improve Outcome: Progressing   Problem: Skin Integrity: Goal: Risk for impaired skin integrity will decrease Outcome: Progressing   Problem: Education: Goal: Knowledge of General Education information will improve Description: Including pain rating scale, medication(s)/side effects and non-pharmacologic comfort measures Outcome: Progressing   Problem: Health Behavior/Discharge Planning: Goal: Ability to manage health-related needs will improve Outcome: Progressing   Problem: Clinical Measurements: Goal: Ability to maintain clinical measurements within normal limits will improve Outcome: Progressing Goal: Will remain free from infection Outcome: Progressing Goal: Diagnostic test results will improve Outcome: Progressing Goal: Respiratory complications will improve Outcome: Progressing Goal: Cardiovascular complication will be avoided Outcome: Progressing   Problem: Activity: Goal: Risk for activity intolerance will decrease Outcome: Progressing   Problem: Nutrition: Goal: Adequate nutrition will be maintained Outcome: Progressing   Problem: Elimination: Goal: Will not experience complications related to bowel motility Outcome: Progressing Goal: Will not experience complications related to urinary  retention Outcome: Progressing   Problem: Pain Managment: Goal: General experience of comfort will improve Outcome: Progressing   Problem: Safety: Goal: Ability to remain free from injury will improve Outcome: Progressing   Problem: Skin Integrity: Goal: Risk for impaired skin integrity will decrease Outcome: Progressing

## 2022-10-21 NOTE — Progress Notes (Signed)
PROGRESS NOTE    [name]  YBO:[id number] DOB: [birth year] DOA: 10/19/2022 PCP: Lauree Chandler, NP     Brief Narrative:  [name] is an 88 year old female with past medical history significant for dementia, GERD, HTN, CKD, who present to the hospital with confusion. Per family, they reported that patient was not at her baseline and not getting up as normal.  In the emergency department, she was found to have wheezing.  She was started on steroids as well as empiric antibiotics.  She had elevated BNP and was also ordered for Lasix and cardiology consulted for CHF exacerbation.  New events last 24 hours / Subjective: Patient eating breakfast in bed.  She is alert and oriented to self, city of [place], Christmas coming up but not to hospital or year.  She has no physical complaints.  Discussed with nephew, her POA over the phone.  Patient lives at home alone, and between caregivers, probably has around 14 to 15 hours of assistance at home.  Family is requesting patient to be placed in a facility due to her safety concerns and does not have 24-hour care.  Assessment & Plan:   Principal Problem:   CAP (community acquired pneumonia) Active Problems:   Essential hypertension   MGUS (monoclonal gammopathy of unknown significance)   CKD (chronic kidney disease) stage 3, GFR 30-59 ml/min (HCC)   COPD with acute exacerbation (HCC)   Acute on chronic combined systolic (congestive) and diastolic (congestive) heart failure (HCC)   Dementia with behavioral disturbance (HCC)   Normocytic anemia   Acute metabolic encephalopathy   Elevated troponin   Acute metabolic encephalopathy -Insetting of underlying dementia, pneumonia, heart failure -Zyprexa  -Seems to be her baseline now  CAP -COVID, influenza, RVP negative -Strep pneumo antigen negative -Rocephin, azithromycin -Procalcitonin trending downward  COPD exacerbation -Solu-Medrol, de-escalate to prednisone  Acute on  chronic combined systolic and diastolic heart failure -IV Lasix, hydralazine, Imdur -Cardiology consulted -Echocardiogram showing grade 2 diastolic dysfunction  Hypertension -Hydralazine  CKD stage IIIb -Baseline creatinine 1.3 -Stable  Demand ischemia -Insetting of pneumonia, COPD, CHF  TB test -Requested by family member -Ordered 12/22    DVT prophylaxis:  SCDs Start: 10/20/22 0819  Code Status: Full Family Communication: None at bedside, discussed with nephew over the phone Disposition Plan:  Status is: Inpatient Remains inpatient appropriate because: IV lasix, IV antibiotics, SNF placement   Antimicrobials:  Anti-infectives (From admission, onward)    Start     Dose/Rate Route Frequency Ordered Stop   10/20/22 2300  cefTRIAXone (ROCEPHIN) 2 g in sodium chloride 0.9 % 100 mL IVPB        2 g 200 mL/hr over 30 Minutes Intravenous Every 24 hours 10/19/22 2338 10/25/22 2259   10/20/22 2300  azithromycin (ZITHROMAX) 500 mg in sodium chloride 0.9 % 250 mL IVPB        500 mg 250 mL/hr over 60 Minutes Intravenous Every 24 hours 10/19/22 2338 10/25/22 2259   10/19/22 2300  cefTRIAXone (ROCEPHIN) 1 g in sodium chloride 0.9 % 100 mL IVPB        1 g 200 mL/hr over 30 Minutes Intravenous  Once 10/19/22 2255 10/20/22 0048   10/19/22 2300  azithromycin (ZITHROMAX) 500 mg in sodium chloride 0.9 % 250 mL IVPB        500 mg 250 mL/hr over 60 Minutes Intravenous  Once 10/19/22 2255 10/20/22 0546        Objective: Vitals:   10/21/22  0500 10/21/22 0521 10/21/22 0556 10/21/22 1007  BP:  (!) 181/90 (!) 116/99 (!) 140/51  Pulse:  65  69  Resp:  16    Temp:  (!) 97.3 F (36.3 C)  97.8 F (36.6 C)  TempSrc:  Oral  Oral  SpO2:  100%  100%  Weight: 64.4 kg     Height:        Intake/Output Summary (Last 24 hours) at 10/21/2022 1022 Last data filed at 10/21/2022 1012 Gross per 24 hour  Intake 3 ml  Output 300 ml  Net -297 ml    Filed Weights   10/19/22 2224 10/21/22  0500  Weight: 61.2 kg 64.4 kg    Examination:  General exam: Appears calm and comfortable  Respiratory system: CTAB anteriorly, on room air without distress  Cardiovascular system: S1 & S2 heard, RRR. No murmurs. + pedal edema. Gastrointestinal system: Abdomen is nondistended, soft and nontender. Normal bowel sounds heard. Central nervous system: Alert and oriented to self only  Extremities: Symmetric in appearance  Skin: +chronic venous stasis   Data Reviewed: I have personally reviewed following labs and imaging studies  CBC: Recent Labs  Lab 10/19/22 2051 10/20/22 1040  WBC 6.6 7.4  NEUTROABS 4.8  --   HGB 8.5* 9.1*  HCT 28.7* 30.9*  MCV 88.9 88.5  PLT 249 025    Basic Metabolic Panel: Recent Labs  Lab 10/19/22 2051 10/19/22 2236 10/20/22 1040  NA 144  --  142  K 3.9  --  4.2  CL 114*  --  111  CO2 21*  --  19*  GLUCOSE 73  --  133*  BUN 22  --  24*  CREATININE 1.32*  --  1.53*  CALCIUM 9.2  --  9.1  MG  --  1.9 2.2  PHOS  --  3.6 5.1*    GFR: Estimated Creatinine Clearance: 20.7 mL/min (A) (by C-G formula based on SCr of 1.53 mg/dL (H)). Liver Function Tests: Recent Labs  Lab 10/19/22 2051 10/20/22 1040  AST 20 20  ALT 14 13  ALKPHOS 64 57  BILITOT 0.7 0.7  PROT 7.7 8.0  ALBUMIN 2.8* 2.8*    No results for input(s): "LIPASE", "AMYLASE" in the last 168 hours. Recent Labs  Lab 10/20/22 0043  AMMONIA 15    Coagulation Profile: No results for input(s): "INR", "PROTIME" in the last 168 hours. Cardiac Enzymes: Recent Labs  Lab 10/19/22 2236  CKTOTAL 250*    BNP (last 3 results) No results for input(s): "PROBNP" in the last 8760 hours. HbA1C: No results for input(s): "HGBA1C" in the last 72 hours. CBG: No results for input(s): "GLUCAP" in the last 168 hours. Lipid Profile: No results for input(s): "CHOL", "HDL", "LDLCALC", "TRIG", "CHOLHDL", "LDLDIRECT" in the last 72 hours. Thyroid Function Tests: Recent Labs    10/19/22 2051  TSH  1.355    Anemia Panel: Recent Labs    10/19/22 2051 10/20/22 0043  VITAMINB12  --  722  FOLATE  --  15.3  FERRITIN  --  235  TIBC  --  206*  IRON  --  20*  RETICCTPCT 1.9  --     Sepsis Labs: Recent Labs  Lab 10/19/22 2051 10/19/22 2217 10/19/22 2236 10/20/22 1040  PROCALCITON  --   --  2.43 1.29  LATICACIDVEN 1.5 1.5  --   --      Recent Results (from the past 240 hour(s))  Resp panel by RT-PCR (RSV, Flu A&B, Covid) Anterior  Nasal Swab     Status: None   Collection Time: 10/19/22  8:51 PM   Specimen: Anterior Nasal Swab  Result Value Ref Range Status   SARS Coronavirus 2 by RT PCR NEGATIVE NEGATIVE Final    Comment: (NOTE) SARS-CoV-2 target nucleic acids are NOT DETECTED.  The SARS-CoV-2 RNA is generally detectable in upper respiratory specimens during the acute phase of infection. The lowest concentration of SARS-CoV-2 viral copies this assay can detect is 138 copies/mL. A negative result does not preclude SARS-Cov-2 infection and should not be used as the sole basis for treatment or other patient management decisions. A negative result may occur with  improper specimen collection/handling, submission of specimen other than nasopharyngeal swab, presence of viral mutation(s) within the areas targeted by this assay, and inadequate number of viral copies(<138 copies/mL). A negative result must be combined with clinical observations, patient history, and epidemiological information. The expected result is Negative.  Fact Sheet for Patients:  EntrepreneurPulse.com.au  Fact Sheet for Healthcare Providers:  IncredibleEmployment.be  This test is no t yet approved or cleared by the [place] FDA and  has been authorized for detection and/or diagnosis of SARS-CoV-2 by FDA under an Emergency Use Authorization (EUA). This EUA will remain  in effect (meaning this test can be used) for the duration of the COVID-19 declaration  under Section 564(b)(1) of the Act, 21 U.S.C.section 360bbb-3(b)(1), unless the authorization is terminated  or revoked sooner.       Influenza A by PCR NEGATIVE NEGATIVE Final   Influenza B by PCR NEGATIVE NEGATIVE Final    Comment: (NOTE) The Xpert Xpress SARS-CoV-2/FLU/RSV plus assay is intended as an aid in the diagnosis of influenza from Nasopharyngeal swab specimens and should not be used as a sole basis for treatment. Nasal washings and aspirates are unacceptable for Xpert Xpress SARS-CoV-2/FLU/RSV testing.  Fact Sheet for Patients: EntrepreneurPulse.com.au  Fact Sheet for Healthcare Providers: IncredibleEmployment.be  This test is not yet approved or cleared by the [place] FDA and has been authorized for detection and/or diagnosis of SARS-CoV-2 by FDA under an Emergency Use Authorization (EUA). This EUA will remain in effect (meaning this test can be used) for the duration of the COVID-19 declaration under Section 564(b)(1) of the Act, 21 U.S.C. section 360bbb-3(b)(1), unless the authorization is terminated or revoked.     Resp Syncytial Virus by PCR NEGATIVE NEGATIVE Final    Comment: (NOTE) Fact Sheet for Patients: EntrepreneurPulse.com.au  Fact Sheet for Healthcare Providers: IncredibleEmployment.be  This test is not yet approved or cleared by the [place] FDA and has been authorized for detection and/or diagnosis of SARS-CoV-2 by FDA under an Emergency Use Authorization (EUA). This EUA will remain in effect (meaning this test can be used) for the duration of the COVID-19 declaration under Section 564(b)(1) of the Act, 21 U.S.C. section 360bbb-3(b)(1), unless the authorization is terminated or revoked.  Performed at [facility], [place] [address] [place], [place] [address]   Blood culture (routine x 2)     Status: None (Preliminary result)   Collection Time:  10/19/22 10:15 PM   Specimen: BLOOD  Result Value Ref Range Status   Specimen Description   Final    BLOOD LEFT ANTECUBITAL Performed at [facility] [address], [place], [place] [address]    Special Requests   Final    BOTTLES DRAWN AEROBIC AND ANAEROBIC Blood Culture results may not be optimal due to an inadequate volume of blood received in  culture bottles Performed at [facility] [address], [place], [place] [address]    Culture   Final    NO GROWTH 1 DAY Performed at [facility], [place] [address], [place], [place] [address]    Report Status PENDING  Incomplete  Blood culture (routine x 2)     Status: None (Preliminary result)   Collection Time: 10/19/22 10:17 PM   Specimen: BLOOD  Result Value Ref Range Status   Specimen Description   Final    BLOOD RIGHT ANTECUBITAL Performed at [facility] [address], [place], [place] [address]    Special Requests   Final    BOTTLES DRAWN AEROBIC AND ANAEROBIC Blood Culture adequate volume Performed at [facility] [address], [place], [place] [address]    Culture  Setup Time   Final    GRAM POSITIVE COCCI AEROBIC BOTTLE ONLY CRITICAL RESULT CALLED TO, READ BACK BY AND VERIFIED WITH: L POINDEXTER,PHARMD'@0419'$  10/21/22 [place] Performed at [facility], [place] [address] [place], [place] [address]    Culture GRAM POSITIVE COCCI  Final   Report Status PENDING  Incomplete  Blood Culture ID Panel (Reflexed)     Status: Abnormal   Collection Time: 10/19/22 10:17 PM  Result Value Ref Range Status   Enterococcus faecalis NOT DETECTED NOT DETECTED Final   Enterococcus Faecium NOT DETECTED NOT DETECTED Final   Listeria monocytogenes NOT DETECTED NOT DETECTED Final   Staphylococcus species DETECTED (A) NOT DETECTED Final    Comment: CRITICAL RESULT CALLED TO, READ BACK BY AND VERIFIED WITH: L POINDEXTER,PHARMD'@0420'$  10/21/22 [place]     Staphylococcus aureus (BCID) NOT DETECTED NOT DETECTED Final   Staphylococcus epidermidis DETECTED (A) NOT DETECTED Final    Comment: CRITICAL RESULT CALLED TO, READ BACK BY AND VERIFIED WITH: L POINDEXTER,PHARMD'@0420'$  10/21/22 [place]    Staphylococcus lugdunensis NOT DETECTED NOT DETECTED Final   Streptococcus species NOT DETECTED NOT DETECTED Final   Streptococcus agalactiae NOT DETECTED NOT DETECTED Final   Streptococcus pneumoniae NOT DETECTED NOT DETECTED Final   Streptococcus pyogenes NOT DETECTED NOT DETECTED Final   A.calcoaceticus-baumannii NOT DETECTED NOT DETECTED Final   Bacteroides fragilis NOT DETECTED NOT DETECTED Final   Enterobacterales NOT DETECTED NOT DETECTED Final   Enterobacter cloacae complex NOT DETECTED NOT DETECTED Final   Escherichia coli NOT DETECTED NOT DETECTED Final   Klebsiella aerogenes NOT DETECTED NOT DETECTED Final   Klebsiella oxytoca NOT DETECTED NOT DETECTED Final   Klebsiella pneumoniae NOT DETECTED NOT DETECTED Final   Proteus species NOT DETECTED NOT DETECTED Final   Salmonella species NOT DETECTED NOT DETECTED Final   Serratia marcescens NOT DETECTED NOT DETECTED Final   Haemophilus influenzae NOT DETECTED NOT DETECTED Final   Neisseria meningitidis NOT DETECTED NOT DETECTED Final   Pseudomonas aeruginosa NOT DETECTED NOT DETECTED Final   Stenotrophomonas maltophilia NOT DETECTED NOT DETECTED Final   Candida albicans NOT DETECTED NOT DETECTED Final   Candida auris NOT DETECTED NOT DETECTED Final   Candida glabrata NOT DETECTED NOT DETECTED Final   Candida krusei NOT DETECTED NOT DETECTED Final   Candida parapsilosis NOT DETECTED NOT DETECTED Final   Candida tropicalis NOT DETECTED NOT DETECTED Final   Cryptococcus neoformans/gattii NOT DETECTED NOT DETECTED Final   Methicillin resistance mecA/C NOT DETECTED NOT DETECTED Final    Comment: Performed at [facility], [address] [place], [place] [address]  Expectorated Sputum Assessment w  Gram Stain, Rflx to  Resp Cult     Status: None   Collection Time: 10/19/22 10:36 PM   Specimen: Expectorated Sputum  Result Value Ref Range Status   Specimen Description EXPECTORATED SPUTUM  Final   Special Requests NONE  Final   Sputum evaluation   Final    THIS SPECIMEN IS ACCEPTABLE FOR SPUTUM CULTURE Performed at [facility], [place] [address], [place], [place] [address]    Report Status 10/19/2022 FINAL  Final  Culture, Respiratory w Gram Stain     Status: None (Preliminary result)   Collection Time: 10/19/22 10:36 PM  Result Value Ref Range Status   Specimen Description   Final    EXPECTORATED SPUTUM Performed at [facility] [address], [place], [place] [address]    Special Requests   Final    NONE Reflexed from [id number] Performed at [facility], [place] [address] [place], [place] [address]    Gram Stain   Final    FEW SQUAMOUS EPITHELIAL CELLS PRESENT MODERATE WBC PRESENT,BOTH PMN AND MONONUCLEAR MODERATE GRAM NEGATIVE RODS MODERATE GRAM POSITIVE COCCI IN PAIRS    Culture   Final    CULTURE REINCUBATED FOR BETTER GROWTH Performed at [facility], [place] [address] [place], [place] [address]    Report Status PENDING  Incomplete  Respiratory (~20 pathogens) panel by PCR     Status: None   Collection Time: 10/20/22 12:43 AM   Specimen: Nasopharyngeal Swab; Respiratory  Result Value Ref Range Status   Adenovirus NOT DETECTED NOT DETECTED Final   Coronavirus 229E NOT DETECTED NOT DETECTED Final    Comment: (NOTE) The Coronavirus on the Respiratory Panel, DOES NOT test for the novel  Coronavirus (2019 nCoV)    Coronavirus HKU1 NOT DETECTED NOT DETECTED Final   Coronavirus NL63 NOT DETECTED NOT DETECTED Final   Coronavirus OC43 NOT DETECTED NOT DETECTED Final   Metapneumovirus NOT DETECTED NOT DETECTED Final   Rhinovirus / Enterovirus NOT DETECTED NOT DETECTED Final   Influenza A NOT DETECTED NOT DETECTED  Final   Influenza B NOT DETECTED NOT DETECTED Final   Parainfluenza Virus 1 NOT DETECTED NOT DETECTED Final   Parainfluenza Virus 2 NOT DETECTED NOT DETECTED Final   Parainfluenza Virus 3 NOT DETECTED NOT DETECTED Final   Parainfluenza Virus 4 NOT DETECTED NOT DETECTED Final   Respiratory Syncytial Virus NOT DETECTED NOT DETECTED Final   Bordetella pertussis NOT DETECTED NOT DETECTED Final   Bordetella Parapertussis NOT DETECTED NOT DETECTED Final   Chlamydophila pneumoniae NOT DETECTED NOT DETECTED Final   Mycoplasma pneumoniae NOT DETECTED NOT DETECTED Final    Comment: Performed at [facility], [place], [place], [place] [address]      Radiology Studies: ECHOCARDIOGRAM COMPLETE  Result Date: 10/20/2022    ECHOCARDIOGRAM REPORT   Patient Name:   ORLENA GARMON Date of Exam: 10/20/2022 Medical Rec #:  [id number]        Height:       59.0 in Accession #:    [id number]       Weight:       135.0 lb Date of Birth:  13-Apr-1932        BSA:          1.561 m Patient Age:    11 years         BP:           164/65 mmHg Patient Gender: F  HR:           61 bpm. Exam Location:  Inpatient Procedure: 3D Echo, 2D Echo, Cardiac Doppler and Color Doppler Indications:    R07.9* Chest pain, unspecified  History:        Patient has prior history of Echocardiogram examinations, most                 recent 11/28/2019. COPD, Signs/Symptoms:Edema, Alzheimer's,                 Altered Mental Status and Chest Pain; Risk Factors:Hypertension.  Sonographer:    Roseanna Rainbow RDCS Referring Phys: [place]  1. Left ventricular ejection fraction, by estimation, is 60 to 65%. The left ventricle has normal function. The left ventricle has no regional wall motion abnormalities. There is mild left ventricular hypertrophy. Left ventricular diastolic parameters are consistent with Grade II diastolic dysfunction (pseudonormalization).  2. Right ventricular systolic function is mildly  reduced. The right ventricular size is mildly enlarged. There is moderately elevated pulmonary artery systolic pressure.  3. Left atrial size was moderately dilated.  4. There is no evidence of cardiac tamponade.  5. No evidence of mitral valve regurgitation.  6. There is mild calcification of the aortic valve. Aortic valve regurgitation is trivial. Aortic valve sclerosis/calcification is present, without any evidence of aortic stenosis.  7. The inferior vena cava is dilated in size with <50% respiratory variability, suggesting right atrial pressure of 15 mmHg. Comparison(s): No significant change from prior study. FINDINGS  Left Ventricle: Left ventricular ejection fraction, by estimation, is 60 to 65%. The left ventricle has normal function. The left ventricle has no regional wall motion abnormalities. The left ventricular internal cavity size was normal in size. There is  mild left ventricular hypertrophy. Left ventricular diastolic parameters are consistent with Grade II diastolic dysfunction (pseudonormalization). Right Ventricle: The right ventricular size is mildly enlarged. Right ventricular systolic function is mildly reduced. There is moderately elevated pulmonary artery systolic pressure. The tricuspid regurgitant velocity is 2.99 m/s, and with an assumed right atrial pressure of 15 mmHg, the estimated right ventricular systolic pressure is 95.1 mmHg. Left Atrium: Left atrial size was moderately dilated. Right Atrium: Right atrial size was normal in size. Pericardium: Trivial pericardial effusion is present. There is no evidence of cardiac tamponade. Mitral Valve: No evidence of mitral valve regurgitation. Tricuspid Valve: Tricuspid valve regurgitation is mild. Aortic Valve: There is mild calcification of the aortic valve. Aortic valve regurgitation is trivial. Aortic valve sclerosis/calcification is present, without any evidence of aortic stenosis. Aortic valve mean gradient measures 6.0 mmHg. Aortic  valve peak gradient measures 11.4 mmHg. Aortic valve area, by VTI measures 2.05 cm. Pulmonic Valve: Pulmonic valve regurgitation is not visualized. Aorta: The aortic root and ascending aorta are structurally normal, with no evidence of dilitation. Venous: The inferior vena cava is dilated in size with less than 50% respiratory variability, suggesting right atrial pressure of 15 mmHg. IAS/Shunts: No atrial level shunt detected by color flow Doppler.  LEFT VENTRICLE PLAX 2D LVIDd:         4.60 cm     Diastology LVIDs:         3.20 cm     LV e' medial:    3.37 cm/s LV PW:         1.20 cm     LV E/e' medial:  26.9 LV IVS:        1.00 cm     LV e' lateral:  4.06 cm/s LVOT diam:     2.00 cm     LV E/e' lateral: 22.3 LV SV:         79 LV SV Index:   51 LVOT Area:     3.14 cm  LV Volumes (MOD) LV vol d, MOD A2C: 97.1 ml LV vol d, MOD A4C: 74.5 ml LV vol s, MOD A2C: 38.0 ml LV vol s, MOD A4C: 27.4 ml LV SV MOD A2C:     59.1 ml LV SV MOD A4C:     74.5 ml LV SV MOD BP:      52.3 ml RIGHT VENTRICLE            IVC RV S prime:     6.50 cm/s  IVC diam: 2.90 cm TAPSE (M-mode): 1.6 cm LEFT ATRIUM             Index        RIGHT ATRIUM           Index LA diam:        4.80 cm 3.08 cm/m   RA Area:     16.70 cm LA Vol (A2C):   68.1 ml 43.64 ml/m  RA Volume:   40.60 ml  26.02 ml/m LA Vol (A4C):   73.8 ml 47.29 ml/m LA Biplane Vol: 71.0 ml 45.49 ml/m  AORTIC VALVE AV Area (Vmax):    2.05 cm AV Area (Vmean):   1.93 cm AV Area (VTI):     2.05 cm AV Vmax:           169.00 cm/s AV Vmean:          111.000 cm/s AV VTI:            0.385 m AV Peak Grad:      11.4 mmHg AV Mean Grad:      6.0 mmHg LVOT Vmax:         110.50 cm/s LVOT Vmean:        68.250 cm/s LVOT VTI:          0.251 m LVOT/AV VTI ratio: 0.65  AORTA Ao Root diam: 2.90 cm Ao Asc diam:  2.80 cm MITRAL VALVE               TRICUSPID VALVE MV Area (PHT): 3.48 cm    TR Peak grad:   35.8 mmHg MV Decel Time: 218 msec    TR Vmax:        299.00 cm/s MV E velocity: 90.60 cm/s MV A  velocity: 49.60 cm/s  SHUNTS MV E/A ratio:  1.83        Systemic VTI:  0.25 m                            Systemic Diam: 2.00 cm Phineas Inches Electronically signed by Phineas Inches Signature Date/Time: 10/20/2022/4:04:52 PM    Final    VAS [place] LOWER EXTREMITY VENOUS (DVT)  Result Date: 10/20/2022  Lower Venous DVT Study Patient Name:  AMIAYAH GIEBEL  Date of Exam:   10/20/2022 Medical Rec #: [id number]         Accession #:    [id number] Date of Birth: [birth year]         Patient Gender: F Patient Age:   72 years [facility]am [facility] Procedure:      VAS [place] LOWER EXTREMITY VENOUS (DVT) Referring Phys: Nyoka Lint DOUTOVA --------------------------------------------------------------------------------  Indications: Edema.  Risk Factors:  None identified. Limitations: Poor ultrasound/tissue interface and patient positioning, patient immobility. Comparison Study: No prior studies. Performing Technologist: Oliver Hum RVT  Examination Guidelines: A complete evaluation includes B-mode imaging, spectral Doppler, color Doppler, and power Doppler as needed of all accessible portions of each vessel. Bilateral testing is considered an integral part of a complete examination. Limited examinations for reoccurring indications may be performed as noted. The reflux portion of the exam is performed with the patient in reverse Trendelenburg.  +---------+---------------+---------+-----------+----------+--------------+ RIGHT    CompressibilityPhasicitySpontaneityPropertiesThrombus Aging +---------+---------------+---------+-----------+----------+--------------+ CFV      Full           Yes      Yes                                 +---------+---------------+---------+-----------+----------+--------------+ SFJ      Full                                                        +---------+---------------+---------+-----------+----------+--------------+ FV Prox  Full                                                         +---------+---------------+---------+-----------+----------+--------------+ FV Mid   Full                                                        +---------+---------------+---------+-----------+----------+--------------+ FV Distal               Yes      Yes                                 +---------+---------------+---------+-----------+----------+--------------+ PFV      Full                                                        +---------+---------------+---------+-----------+----------+--------------+ POP      Full           Yes      Yes                                 +---------+---------------+---------+-----------+----------+--------------+ PTV      Full                                                        +---------+---------------+---------+-----------+----------+--------------+ PERO     Full                                                        +---------+---------------+---------+-----------+----------+--------------+   +---------+---------------+---------+-----------+----------+--------------+  LEFT     CompressibilityPhasicitySpontaneityPropertiesThrombus Aging +---------+---------------+---------+-----------+----------+--------------+ CFV      Full           Yes      Yes                                 +---------+---------------+---------+-----------+----------+--------------+ SFJ      Full                                                        +---------+---------------+---------+-----------+----------+--------------+ FV Prox  Full                                                        +---------+---------------+---------+-----------+----------+--------------+ FV Mid   Full                                                        +---------+---------------+---------+-----------+----------+--------------+ FV Distal               Yes      Yes                                  +---------+---------------+---------+-----------+----------+--------------+ PFV      Full                                                        +---------+---------------+---------+-----------+----------+--------------+ POP      Full           Yes      Yes                                 +---------+---------------+---------+-----------+----------+--------------+ PTV      Full                                                        +---------+---------------+---------+-----------+----------+--------------+ PERO     Full                                                        +---------+---------------+---------+-----------+----------+--------------+     Summary: RIGHT: - There is no evidence of deep vein thrombosis in the lower extremity. However, portions of this examination were limited- see technologist comments above.  - No cystic structure found in the popliteal fossa.  LEFT: - There is no evidence of deep  vein thrombosis in the lower extremity. However, portions of this examination were limited- see technologist comments above.  - No cystic structure found in the popliteal fossa.  *See table(s) above for measurements and observations. Electronically signed by Deitra Mayo MD on 10/20/2022 at 3:34:37 PM.    Final    CT HEAD WO CONTRAST (5MM)  Result Date: 10/19/2022 CLINICAL DATA:  Mental status change EXAM: CT HEAD WITHOUT CONTRAST TECHNIQUE: Contiguous axial images were obtained from the base of the skull through the vertex without intravenous contrast. RADIATION DOSE REDUCTION: This exam was performed according to the departmental dose-optimization program which includes automated exposure control, adjustment of the mA and/or kV according to patient size and/or use of iterative reconstruction technique. COMPARISON:  CT brain 09/13/2022 FINDINGS: Brain: There is motion degradation. No acute territorial infarction, hemorrhage or intracranial mass. Atrophy and chronic small  vessel ischemic changes of the white matter. Stable ventricle size. Vascular: No hyperdense vessels.  Carotid vascular calcification. Skull: Normal. Negative for fracture or focal lesion. Sinuses/Orbits: No acute finding. Other: None IMPRESSION: 1. Motion degraded exam. No definite CT evidence for acute intracranial abnormality. 2. Atrophy and chronic small vessel ischemic changes of the white matter. Electronically Signed   By: Donavan Foil M.D.   On: 10/19/2022 22:02   DG Chest Portable 1 View  Result Date: 10/19/2022 CLINICAL DATA:  Cough, shortness of breath, wheezing EXAM: PORTABLE CHEST 1 VIEW COMPARISON:  09/13/2022 FINDINGS: Mild retrocardiac opacity, suspicious for pneumonia. Cardiomegaly with pulmonary vascular congestion. No frank interstitial edema. No pleural effusion or pneumothorax. IMPRESSION: Mild retrocardiac opacity, suspicious for pneumonia. Electronically Signed   By: Julian Hy M.D.   On: 10/19/2022 21:21      Scheduled Meds:  furosemide  40 mg Intravenous Daily   guaiFENesin  600 mg Oral BID   hydrALAZINE  50 mg Oral BID   isosorbide mononitrate  30 mg Oral Daily   OLANZapine  5 mg Oral QHS   pantoprazole  40 mg Oral Daily   predniSONE  40 mg Oral Q breakfast   sodium chloride flush  3 mL Intravenous Q12H   tuberculin  5 Units Intradermal Once   Continuous Infusions:  sodium chloride     azithromycin 500 mg (10/20/22 2253)   cefTRIAXone (ROCEPHIN)  IV 2 g (10/20/22 2218)     LOS: 2 days   Time spent: 25 minutes   Dessa Phi, DO [facility] 10/21/2022, 10:22 AM   Available via Epic secure chat 7am-7pm After these hours, please refer to coverage provider listed on [email or web address]

## 2022-10-21 NOTE — Progress Notes (Signed)
PHARMACY - PHYSICIAN COMMUNICATION CRITICAL VALUE ALERT - BLOOD CULTURE IDENTIFICATION (BCID)  [name] is an 88 y.o. female who presented to [facility] on 10/19/2022 with community acquired pneumonia  Assessment:  BCID + for Staph epidermidis (no methicillin resistance detected) in 1 out of 4 bottles; likely contaminant    Name of physician (or Provider) Contacted: Raenette Rover, FNP  Current antibiotics: Ceftriaxone, Azithromycin  Changes to prescribed antibiotics recommended:  Patient is on recommended antibiotics - No changes needed  Results for orders placed or performed during the hospital encounter of 10/19/22  Blood Culture ID Panel (Reflexed) (Collected: 10/19/2022 10:17 PM)  Result Value Ref Range   Enterococcus faecalis NOT DETECTED NOT DETECTED   Enterococcus Faecium NOT DETECTED NOT DETECTED   Listeria monocytogenes NOT DETECTED NOT DETECTED   Staphylococcus species DETECTED (A) NOT DETECTED   Staphylococcus aureus (BCID) NOT DETECTED NOT DETECTED   Staphylococcus epidermidis DETECTED (A) NOT DETECTED   Staphylococcus lugdunensis NOT DETECTED NOT DETECTED   Streptococcus species NOT DETECTED NOT DETECTED   Streptococcus agalactiae NOT DETECTED NOT DETECTED   Streptococcus pneumoniae NOT DETECTED NOT DETECTED   Streptococcus pyogenes NOT DETECTED NOT DETECTED   A.calcoaceticus-baumannii NOT DETECTED NOT DETECTED   Bacteroides fragilis NOT DETECTED NOT DETECTED   Enterobacterales NOT DETECTED NOT DETECTED   Enterobacter cloacae complex NOT DETECTED NOT DETECTED   Escherichia coli NOT DETECTED NOT DETECTED   Klebsiella aerogenes NOT DETECTED NOT DETECTED   Klebsiella oxytoca NOT DETECTED NOT DETECTED   Klebsiella pneumoniae NOT DETECTED NOT DETECTED   Proteus species NOT DETECTED NOT DETECTED   Salmonella species NOT DETECTED NOT DETECTED   Serratia marcescens NOT DETECTED NOT DETECTED   Haemophilus influenzae NOT DETECTED NOT DETECTED   Neisseria  meningitidis NOT DETECTED NOT DETECTED   Pseudomonas aeruginosa NOT DETECTED NOT DETECTED   Stenotrophomonas maltophilia NOT DETECTED NOT DETECTED   Candida albicans NOT DETECTED NOT DETECTED   Candida auris NOT DETECTED NOT DETECTED   Candida glabrata NOT DETECTED NOT DETECTED   Candida krusei NOT DETECTED NOT DETECTED   Candida parapsilosis NOT DETECTED NOT DETECTED   Candida tropicalis NOT DETECTED NOT DETECTED   Cryptococcus neoformans/gattii NOT DETECTED NOT DETECTED   Methicillin resistance mecA/C NOT DETECTED NOT DETECTED    Everette Rank, PharmD 10/21/2022  4:46 AM

## 2022-10-21 NOTE — Evaluation (Signed)
Occupational Therapy Evaluation Patient Details Name: [name] MRN: [id number] DOB: 10/20/1932 Today's Date: 10/21/2022   History of Present Illness  (88 yr old female admitted with wheezing & confusion. 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 PMH: dementia, CHF, asthma, chronic bronchitis, CKD, cholecystectomy)   Clinical Impression      The patient currently requires at least mod assist for tasks, such as supine to sit, lower body dressing, and sit to stand. She was noted to be with unsteadiness in standing, and she had difficulty lifting her BLE, in order to attempt safe out of bed activity. She has a history of dementia, with memory impairment noted; she was oriented to person only. She needed intermittent redirection to tasks, as she could be distracted & often conversed about unrelated topics. She often referenced currently being "on the highway."  She will benefit from further OT services to facilitate improved ADL performance & to decrease the risk for further weakness and deconditioning.    Recommendations for follow up therapy are one component of a multi-disciplinary discharge planning process, led by the attending physician.  Recommendations may be updated based on patient status, additional functional criteria and insurance authorization.   Follow Up Recommendations  Skilled nursing-short term rehab (<3 hours/day)     Assistance Recommended at Discharge Frequent or constant Supervision/Assistance  Patient can return home with the following A lot of help with bathing/dressing/bathroom;Direct supervision/assist for medications management;Direct supervision/assist for financial management;Assistance with cooking/housework;Assist for transportation;A lot of help with walking and/or transfers    Functional Status Assessment  Patient has had a recent decline in their functional status and demonstrates the ability to  make significant improvements in function in a reasonable and predictable amount of time.  Equipment Recommendations  Other (comment) (to be determined pending progress at next setting)       Precautions / Restrictions Precautions Precautions: Fall Restrictions Weight Bearing Restrictions: No      Mobility Bed Mobility Overal bed mobility: Needs Assistance Bed Mobility: Supine to Sit, Sit to Supine     Supine to sit: Mod assist, HOB elevated Sit to supine: Mod assist   General bed mobility comments: assist for legs off EOB and to lift trunk, pt helping some to scoot to EOB once upright and given increased time, to supine assist for legs onto bed, required cues for attention and sequencing    Transfers Overall transfer level: Needs assistance Equipment used: Rolling walker (2 wheels) Transfers: Sit to/from Stand Sit to Stand: Mod assist           General transfer comment: required increased effort & cues for hand placement and for trunk extension in standing      Balance Overall balance assessment: Needs assistance     Sitting balance - Comments: static sitting-good. dynamic sitting-fair+     Standing balance-Leahy Scale: Poor         ADL either performed or assessed with clinical judgement   ADL Overall ADL's : Needs assistance/impaired Eating/Feeding: Bed level;Set up Eating/Feeding Details (indicate cue type and reason): she drank water from a cup Grooming: Minimal assistance Grooming Details (indicate cue type and reason): simulated seated EOB         Upper Body Dressing : Moderate assistance   Lower Body Dressing: Maximal assistance                      Pertinent Vitals/Pain Pain Assessment Pain Location:  She reported having "alot" of unspecified pain. Pain Intervention(s): Limited activity within patient's tolerance, Repositioned        Extremity/Trunk Assessment Upper Extremity Assessment Upper Extremity Assessment: LUE  deficits/detail;RUE deficits/detail RUE Deficits / Details:  (decreased shoulder AROM, which may be a chronic deficit; arthritic changes of hands; generalized weakness) LUE:  (decreased shoulder AROM, which may be a chronic deficit; arthritic changes of hands; generalized weakness)   Lower Extremity Assessment Lower Extremity Assessment: Generalized weakness       Communication Communication Communication: No difficulties   Cognition Arousal/Alertness: Awake/alert   Overall Cognitive Status: History of cognitive impairments - at baseline Area of Impairment: Memory, Orientation            General Comments: Oriented to person, disoriented to place, time, and situation, able to follow 1 step commands with occasional repetition, difficulty dividing attention, impaired memory                 Home Living Family/patient expects to be discharged to:: Private residence Living Arrangements: Alone   Type of Home: House      Additional Comments: The patient was an unreliable historian, given her history of dementia. Per her medical chart, she lives alone and has ~14 to 15 hours of in-home caregiver support per day. Her family is interested in SNF, once she discharges from the hospital.              OT Problem List: Decreased strength;Decreased range of motion;Decreased activity tolerance;Impaired balance (sitting and/or standing);Decreased cognition;Decreased safety awareness;Decreased knowledge of use of DME or AE      OT Treatment/Interventions: Self-care/ADL training;Therapeutic exercise;Therapeutic activities;Cognitive remediation/compensation;Energy conservation;Patient/family education;DME and/or AE instruction;Balance training    OT Goals(Current goals can be found in the care plan section) Acute Rehab OT Goals Patient Stated Goal: to go home soon OT Goal Formulation: With patient Time For Goal Achievement: 11/04/22 Potential to Achieve Goals: Fair ADL Goals Pt Will  Perform Grooming: with min guard assist;standing Pt Will Perform Upper Body Dressing: with set-up;sitting Pt Will Perform Lower Body Dressing: with min guard assist;sit to/from stand Pt Will Transfer to Toilet: with min guard assist;ambulating;grab bars Pt Will Perform Toileting - Clothing Manipulation and hygiene: with min guard assist;sit to/from stand  OT Frequency: Min 2X/week       AM-PAC OT "6 Clicks" Daily Activity     Outcome Measure Help from another person eating meals?: None Help from another person taking care of personal grooming?: A Little Help from another person toileting, which includes using toliet, bedpan, or urinal?: A Lot   Help from another person to put on and taking off regular upper body clothing?: A Lot Help from another person to put on and taking off regular lower body clothing?: A Lot 6 Click Score: 13   End of Session Equipment Utilized During Treatment: Rolling walker (2 wheels) Nurse Communication: Mobility status  Activity Tolerance: Other (comment) (Fair overall tolerance) Patient left: in bed;with call bell/phone within reach;with bed alarm set  OT Visit Diagnosis: Unsteadiness on feet (R26.81);Muscle weakness (generalized) (M62.81)                Time: 8938-1017 OT Time Calculation (min): 30 min Charges:  OT General Charges $OT Visit: 1 Visit OT Evaluation $OT Eval Moderate Complexity: 1 Mod OT Treatments $Therapeutic Activity: 8-22 mins    Leota Sauers, OTR/L 10/21/2022, 2:58 PM

## 2022-10-21 NOTE — Progress Notes (Signed)
PHARMACIST - PHYSICIAN COMMUNICATION DR:   Maylene Roes CONCERNING: Antibiotic IV to Oral Route Change Policy  RECOMMENDATION: This patient is receiving azithromycin by the intravenous route.  Based on criteria approved by the Pharmacy and Therapeutics Committee, the antibiotic(s) is/are being converted to the equivalent oral dose form(s).   DESCRIPTION: These criteria include: Patient being treated for a respiratory tract infection, urinary tract infection, cellulitis or clostridium difficile associated diarrhea if on metronidazole The patient is not neutropenic and does not exhibit a GI malabsorption state The patient is eating (either orally or via tube) and/or has been taking other orally administered medications for a least 24 hours The patient is improving clinically and has a Tmax < 100.5  If you have questions about this conversion, please contact the Pharmacy Department  '[]'$   [phone] )  [facility] '[]'$   [phone] )  [facility]  '[]'$   [phone] )  [facility] '[x]'$   [phone] )  [facility]    Thank you for allowing pharmacy to be a part of this patient's care.  Royetta Asal, PharmD, BCPS Clinical Pharmacist [facility] Please utilize Amion for appropriate phone number to reach the unit pharmacist ([place]) 10/21/2022 10:59 AM

## 2022-10-21 NOTE — Progress Notes (Signed)
Cardiologist:  [name]  Subjective:  Breathing better 6 lbs off her normal weight   Objective:  Vitals:   10/21/22 0130 10/21/22 0500 10/21/22 0521 10/21/22 0556  BP: (!) 156/76  (!) 181/90 (!) 116/99  Pulse: 62  65   Resp: 20  16   Temp: (!) 97.4 F (36.3 C)  (!) 97.3 F (36.3 C)   TempSrc: Oral  Oral   SpO2: 100%  100%   Weight:  64.4 kg    Height:        Intake/Output from previous day:  Intake/Output Summary (Last 24 hours) at 10/21/2022 6387 Last data filed at 10/21/2022 5643 Gross per 24 hour  Intake --  Output 300 ml  Net -300 ml    Physical Exam: Affect appropriate Elderly black female  HEENT: normal Neck supple with no adenopathy JVP normal no bruits no thyromegaly Lungs clear with no wheezing and good diaphragmatic motion Heart:  S1/S2 no murmur, no rub, gallop or click PMI normal Abdomen: benighn, BS positve, no tenderness, no AAA no bruit.  No HSM or HJR Distal pulses intact with no bruits Plus one edema Neuro non-focal Skin warm and dry No muscular weakness   Lab Results: Basic Metabolic Panel: Recent Labs    10/19/22 2051 10/19/22 2236 10/20/22 1040  NA 144  --  142  K 3.9  --  4.2  CL 114*  --  111  CO2 21*  --  19*  GLUCOSE 73  --  133*  BUN 22  --  24*  CREATININE 1.32*  --  1.53*  CALCIUM 9.2  --  9.1  MG  --  1.9 2.2  PHOS  --  3.6 5.1*   Liver Function Tests: Recent Labs    10/19/22 2051 10/20/22 1040  AST 20 20  ALT 14 13  ALKPHOS 64 57  BILITOT 0.7 0.7  PROT 7.7 8.0  ALBUMIN 2.8* 2.8*   No results for input(s): "LIPASE", "AMYLASE" in the last 72 hours. CBC: Recent Labs    10/19/22 2051 10/20/22 1040  WBC 6.6 7.4  NEUTROABS 4.8  --   HGB 8.5* 9.1*  HCT 28.7* 30.9*  MCV 88.9 88.5  PLT 249 218   Cardiac Enzymes: Recent Labs    10/19/22 2236  CKTOTAL 250*   BNP: Invalid input(s): "POCBNP" D-Dimer: No results for input(s): "DDIMER" in the last 72 hours. Hemoglobin A1C: No results for input(s):  "HGBA1C" in the last 72 hours. Fasting Lipid Panel: No results for input(s): "CHOL", "HDL", "LDLCALC", "TRIG", "CHOLHDL", "LDLDIRECT" in the last 72 hours. Thyroid Function Tests: Recent Labs    10/19/22 2051  TSH 1.355   Anemia Panel: Recent Labs    10/19/22 2051 10/20/22 0043  VITAMINB12  --  722  FOLATE  --  15.3  FERRITIN  --  235  TIBC  --  206*  IRON  --  20*  RETICCTPCT 1.9  --     Imaging: ECHOCARDIOGRAM COMPLETE  Result Date: 10/20/2022    ECHOCARDIOGRAM REPORT   Patient Name:   [name] Date of Exam: 10/20/2022 Medical Rec #:  [id number]        Height:       59.0 in Accession #:    [id number]       Weight:       135.0 lb Date of Birth:  Dec 17, 1931        BSA:          1.561 m Patient Age:  88 years         BP:           164/65 mmHg Patient Gender: F                HR:           61 bpm. Exam Location:  Inpatient Procedure: 3D Echo, 2D Echo, Cardiac Doppler and Color Doppler Indications:    R07.9* Chest pain, unspecified  History:        Patient has prior history of Echocardiogram examinations, most                 recent 11/28/2019. COPD, Signs/Symptoms:Edema, Alzheimer's,                 Altered Mental Status and Chest Pain; Risk Factors:Hypertension.  Sonographer:    Roseanna Rainbow RDCS Referring Phys: [place]  1. Left ventricular ejection fraction, by estimation, is 60 to 65%. The left ventricle has normal function. The left ventricle has no regional wall motion abnormalities. There is mild left ventricular hypertrophy. Left ventricular diastolic parameters are consistent with Grade II diastolic dysfunction (pseudonormalization).  2. Right ventricular systolic function is mildly reduced. The right ventricular size is mildly enlarged. There is moderately elevated pulmonary artery systolic pressure.  3. Left atrial size was moderately dilated.  4. There is no evidence of cardiac tamponade.  5. No evidence of mitral valve regurgitation.  6. There is  mild calcification of the aortic valve. Aortic valve regurgitation is trivial. Aortic valve sclerosis/calcification is present, without any evidence of aortic stenosis.  7. The inferior vena cava is dilated in size with <50% respiratory variability, suggesting right atrial pressure of 15 mmHg. Comparison(s): No significant change from prior study. FINDINGS  Left Ventricle: Left ventricular ejection fraction, by estimation, is 60 to 65%. The left ventricle has normal function. The left ventricle has no regional wall motion abnormalities. The left ventricular internal cavity size was normal in size. There is  mild left ventricular hypertrophy. Left ventricular diastolic parameters are consistent with Grade II diastolic dysfunction (pseudonormalization). Right Ventricle: The right ventricular size is mildly enlarged. Right ventricular systolic function is mildly reduced. There is moderately elevated pulmonary artery systolic pressure. The tricuspid regurgitant velocity is 2.99 m/s, and with an assumed right atrial pressure of 15 mmHg, the estimated right ventricular systolic pressure is 57.8 mmHg. Left Atrium: Left atrial size was moderately dilated. Right Atrium: Right atrial size was normal in size. Pericardium: Trivial pericardial effusion is present. There is no evidence of cardiac tamponade. Mitral Valve: No evidence of mitral valve regurgitation. Tricuspid Valve: Tricuspid valve regurgitation is mild. Aortic Valve: There is mild calcification of the aortic valve. Aortic valve regurgitation is trivial. Aortic valve sclerosis/calcification is present, without any evidence of aortic stenosis. Aortic valve mean gradient measures 6.0 mmHg. Aortic valve peak gradient measures 11.4 mmHg. Aortic valve area, by VTI measures 2.05 cm. Pulmonic Valve: Pulmonic valve regurgitation is not visualized. Aorta: The aortic root and ascending aorta are structurally normal, with no evidence of dilitation. Venous: The inferior vena  cava is dilated in size with less than 50% respiratory variability, suggesting right atrial pressure of 15 mmHg. IAS/Shunts: No atrial level shunt detected by color flow Doppler.  LEFT VENTRICLE PLAX 2D LVIDd:         4.60 cm     Diastology LVIDs:         3.20 cm     LV e' medial:  3.37 cm/s LV PW:         1.20 cm     LV E/e' medial:  26.9 LV IVS:        1.00 cm     LV e' lateral:   4.06 cm/s LVOT diam:     2.00 cm     LV E/e' lateral: 22.3 LV SV:         79 LV SV Index:   51 LVOT Area:     3.14 cm  LV Volumes (MOD) LV vol d, MOD A2C: 97.1 ml LV vol d, MOD A4C: 74.5 ml LV vol s, MOD A2C: 38.0 ml LV vol s, MOD A4C: 27.4 ml LV SV MOD A2C:     59.1 ml LV SV MOD A4C:     74.5 ml LV SV MOD BP:      52.3 ml RIGHT VENTRICLE            IVC RV S prime:     6.50 cm/s  IVC diam: 2.90 cm TAPSE (M-mode): 1.6 cm LEFT ATRIUM             Index        RIGHT ATRIUM           Index LA diam:        4.80 cm 3.08 cm/m   RA Area:     16.70 cm LA Vol (A2C):   68.1 ml 43.64 ml/m  RA Volume:   40.60 ml  26.02 ml/m LA Vol (A4C):   73.8 ml 47.29 ml/m LA Biplane Vol: 71.0 ml 45.49 ml/m  AORTIC VALVE AV Area (Vmax):    2.05 cm AV Area (Vmean):   1.93 cm AV Area (VTI):     2.05 cm AV Vmax:           169.00 cm/s AV Vmean:          111.000 cm/s AV VTI:            0.385 m AV Peak Grad:      11.4 mmHg AV Mean Grad:      6.0 mmHg LVOT Vmax:         110.50 cm/s LVOT Vmean:        68.250 cm/s LVOT VTI:          0.251 m LVOT/AV VTI ratio: 0.65  AORTA Ao Root diam: 2.90 cm Ao Asc diam:  2.80 cm MITRAL VALVE               TRICUSPID VALVE MV Area (PHT): 3.48 cm    TR Peak grad:   35.8 mmHg MV Decel Time: 218 msec    TR Vmax:        299.00 cm/s MV E velocity: 90.60 cm/s MV A velocity: 49.60 cm/s  SHUNTS MV E/A ratio:  1.83        Systemic VTI:  0.25 m                            Systemic Diam: 2.00 cm Phineas Inches Electronically signed by Phineas Inches Signature Date/Time: 10/20/2022/4:04:52 PM    Final    VAS [place] LOWER EXTREMITY VENOUS  (DVT)  Result Date: 10/20/2022  Lower Venous DVT Study Patient Name:  [name]  Date of Exam:   10/20/2022 Medical Rec #: [id number]         Accession #:    [id number] Date of Birth: [birth year]  Patient Gender: F Patient Age:   16 years Exam Location:  [facility] Procedure:      VAS [place] LOWER EXTREMITY VENOUS (DVT) Referring Phys: Nyoka Lint DOUTOVA --------------------------------------------------------------------------------  Indications: Edema.  Risk Factors: None identified. Limitations: Poor ultrasound/tissue interface and patient positioning, patient immobility. Comparison Study: No prior studies. Performing Technologist: Oliver Hum RVT  Examination Guidelines: A complete evaluation includes B-mode imaging, spectral Doppler, color Doppler, and power Doppler as needed of all accessible portions of each vessel. Bilateral testing is considered an integral part of a complete examination. Limited examinations for reoccurring indications may be performed as noted. The reflux portion of the exam is performed with the patient in reverse Trendelenburg.  +---------+---------------+---------+-----------+----------+--------------+ RIGHT    CompressibilityPhasicitySpontaneityPropertiesThrombus Aging +---------+---------------+---------+-----------+----------+--------------+ CFV      Full           Yes      Yes                                 +---------+---------------+---------+-----------+----------+--------------+ SFJ      Full                                                        +---------+---------------+---------+-----------+----------+--------------+ FV Prox  Full                                                        +---------+---------------+---------+-----------+----------+--------------+ FV Mid   Full                                                        +---------+---------------+---------+-----------+----------+--------------+ FV Distal                Yes      Yes                                 +---------+---------------+---------+-----------+----------+--------------+ PFV      Full                                                        +---------+---------------+---------+-----------+----------+--------------+ POP      Full           Yes      Yes                                 +---------+---------------+---------+-----------+----------+--------------+ PTV      Full                                                        +---------+---------------+---------+-----------+----------+--------------+  PERO     Full                                                        +---------+---------------+---------+-----------+----------+--------------+   +---------+---------------+---------+-----------+----------+--------------+ LEFT     CompressibilityPhasicitySpontaneityPropertiesThrombus Aging +---------+---------------+---------+-----------+----------+--------------+ CFV      Full           Yes      Yes                                 +---------+---------------+---------+-----------+----------+--------------+ SFJ      Full                                                        +---------+---------------+---------+-----------+----------+--------------+ FV Prox  Full                                                        +---------+---------------+---------+-----------+----------+--------------+ FV Mid   Full                                                        +---------+---------------+---------+-----------+----------+--------------+ FV Distal               Yes      Yes                                 +---------+---------------+---------+-----------+----------+--------------+ PFV      Full                                                        +---------+---------------+---------+-----------+----------+--------------+ POP      Full           Yes      Yes                                  +---------+---------------+---------+-----------+----------+--------------+ PTV      Full                                                        +---------+---------------+---------+-----------+----------+--------------+ PERO     Full                                                        +---------+---------------+---------+-----------+----------+--------------+  Summary: RIGHT: - There is no evidence of deep vein thrombosis in the lower extremity. However, portions of this examination were limited- see technologist comments above.  - No cystic structure found in the popliteal fossa.  LEFT: - There is no evidence of deep vein thrombosis in the lower extremity. However, portions of this examination were limited- see technologist comments above.  - No cystic structure found in the popliteal fossa.  *See table(s) above for measurements and observations. Electronically signed by Deitra Mayo MD on 10/20/2022 at 3:34:37 PM.    Final    CT HEAD WO CONTRAST (5MM)  Result Date: 10/19/2022 CLINICAL DATA:  Mental status change EXAM: CT HEAD WITHOUT CONTRAST TECHNIQUE: Contiguous axial images were obtained from the base of the skull through the vertex without intravenous contrast. RADIATION DOSE REDUCTION: This exam was performed according to the departmental dose-optimization program which includes automated exposure control, adjustment of the mA and/or kV according to patient size and/or use of iterative reconstruction technique. COMPARISON:  CT brain 09/13/2022 FINDINGS: Brain: There is motion degradation. No acute territorial infarction, hemorrhage or intracranial mass. Atrophy and chronic small vessel ischemic changes of the white matter. Stable ventricle size. Vascular: No hyperdense vessels.  Carotid vascular calcification. Skull: Normal. Negative for fracture or focal lesion. Sinuses/Orbits: No acute finding. Other: None IMPRESSION: 1. Motion degraded exam. No definite CT  evidence for acute intracranial abnormality. 2. Atrophy and chronic small vessel ischemic changes of the white matter. Electronically Signed   By: Donavan Foil M.D.   On: 10/19/2022 22:02   DG Chest Portable 1 View  Result Date: 10/19/2022 CLINICAL DATA:  Cough, shortness of breath, wheezing EXAM: PORTABLE CHEST 1 VIEW COMPARISON:  09/13/2022 FINDINGS: Mild retrocardiac opacity, suspicious for pneumonia. Cardiomegaly with pulmonary vascular congestion. No frank interstitial edema. No pleural effusion or pneumothorax. IMPRESSION: Mild retrocardiac opacity, suspicious for pneumonia. Electronically Signed   By: Julian Hy M.D.   On: 10/19/2022 21:21    Cardiac Studies:  ECG:  Orders placed or performed during the hospital encounter of 10/19/22   ED EKG   ED EKG   EKG 12-Lead   EKG 12-Lead   EKG 12-Lead     Telemetry:  NSR   Echo: EF 46-80% grade 2 diastolic   Medications:    furosemide  40 mg Intravenous Daily   guaiFENesin  600 mg Oral BID   hydrALAZINE  50 mg Oral BID   isosorbide mononitrate  30 mg Oral Daily   OLANZapine  5 mg Oral QHS   pantoprazole  40 mg Oral Daily   predniSONE  40 mg Oral Q breakfast   sodium chloride flush  3 mL Intravenous Q12H   tuberculin  5 Units Intradermal Once      sodium chloride     azithromycin 500 mg (10/20/22 2253)   cefTRIAXone (ROCEPHIN)  IV 2 g (10/20/22 2218)    Assessment/Plan:   HFpEF:  EF improved compared to TTE done in 2015 Continue iv lasix today change to po in am  PNA/COPD  Rocephin and azithromycin with steroids HTN:  imprvoed with hydralazine   Jenkins Rouge 10/21/2022, 8:26 AM

## 2022-10-22 DIAGNOSIS — I5032 Chronic diastolic (congestive) heart failure: Secondary | ICD-10-CM | POA: Diagnosis not present

## 2022-10-22 DIAGNOSIS — J189 Pneumonia, unspecified organism: Secondary | ICD-10-CM | POA: Diagnosis not present

## 2022-10-22 LAB — CULTURE, RESPIRATORY W GRAM STAIN: Culture: NORMAL

## 2022-10-22 LAB — BASIC METABOLIC PANEL
Anion gap: 6 (ref 5–15)
BUN: 48 mg/dL — ABNORMAL HIGH (ref 8–23)
CO2: 22 mmol/L (ref 22–32)
Calcium: 9.1 mg/dL (ref 8.9–10.3)
Chloride: 109 mmol/L (ref 98–111)
Creatinine, Ser: 1.67 mg/dL — ABNORMAL HIGH (ref 0.44–1.00)
GFR, Estimated: 29 mL/min — ABNORMAL LOW (ref >60)
Glucose, Bld: 183 mg/dL — ABNORMAL HIGH (ref 70–99)
Potassium: 3.8 mmol/L (ref 3.5–5.1)
Sodium: 137 mmol/L (ref 135–145)

## 2022-10-22 LAB — PROCALCITONIN: Procalcitonin: 0.7 ng/mL

## 2022-10-22 MED ORDER — ENSURE ENLIVE PO LIQD
237.0000 mL | Freq: Two times a day (BID) | ORAL | Status: DC
  Administered 2022-10-22 – 2022-10-23 (×3): 237 mL via ORAL

## 2022-10-22 MED ORDER — FUROSEMIDE 40 MG PO TABS
40.0000 mg | ORAL_TABLET | Freq: Every day | ORAL | Status: DC
  Administered 2022-10-22 – 2022-10-23 (×2): 40 mg via ORAL
  Filled 2022-10-22 (×2): qty 1

## 2022-10-22 MED ORDER — ADULT MULTIVITAMIN W/MINERALS CH
1.0000 | ORAL_TABLET | Freq: Every day | ORAL | Status: DC
  Administered 2022-10-22 – 2022-10-25 (×4): 1 via ORAL
  Filled 2022-10-22 (×4): qty 1

## 2022-10-22 MED ORDER — AMLODIPINE BESYLATE 5 MG PO TABS
5.0000 mg | ORAL_TABLET | Freq: Every day | ORAL | Status: DC
  Administered 2022-10-22 – 2022-10-24 (×3): 5 mg via ORAL
  Filled 2022-10-22 (×3): qty 1

## 2022-10-22 NOTE — Plan of Care (Signed)
  Problem: Safety: Goal: Ability to remain free from injury will improve Outcome: Progressing   Problem: Pain Managment: Goal: General experience of comfort will improve Outcome: Progressing   Problem: Respiratory: Goal: Levels of oxygenation will improve Outcome: Progressing

## 2022-10-22 NOTE — Progress Notes (Signed)
Cardiologist:  [name]  Subjective:  Sats ok breathing improved   Objective:  Vitals:   10/21/22 1007 10/21/22 1450 10/21/22 2033 10/22/22 0625  BP: (!) 140/51 (!) 155/77 (!) 161/95 (!) 162/89  Pulse: 69 69 65 (!) 53  Resp:  '16 18 20  '$ Temp: 97.8 F (36.6 C) (!) 97.5 F (36.4 C) 97.6 F (36.4 C) 97.6 F (36.4 C)  TempSrc: Oral Oral Oral   SpO2: 100% 100% 100% 100%  Weight:      Height:        Intake/Output from previous day:  Intake/Output Summary (Last 24 hours) at 10/22/2022 0757 Last data filed at 10/21/2022 2314 Gross per 24 hour  Intake 996.87 ml  Output 202 ml  Net 794.87 ml    Physical Exam: Affect appropriate Elderly black female  HEENT: normal Neck supple with no adenopathy JVP normal no bruits no thyromegaly Lungs clear with no wheezing and good diaphragmatic motion Heart:  S1/S2 no murmur, no rub, gallop or click PMI normal Abdomen: benighn, BS positve, no tenderness, no AAA no bruit.  No HSM or HJR Distal pulses intact with no bruits Plus one edema Neuro non-focal Skin warm and dry No muscular weakness   Lab Results: Basic Metabolic Panel: Recent Labs    10/19/22 2236 10/20/22 1040 10/21/22 1043  NA  --  142 141  K  --  4.2 4.0  CL  --  111 110  CO2  --  19* 20*  GLUCOSE  --  133* 198*  BUN  --  24* 37*  CREATININE  --  1.53* 1.61*  CALCIUM  --  9.1 9.2  MG 1.9 2.2  --   PHOS 3.6 5.1*  --    Liver Function Tests: Recent Labs    10/19/22 2051 10/20/22 1040  AST 20 20  ALT 14 13  ALKPHOS 64 57  BILITOT 0.7 0.7  PROT 7.7 8.0  ALBUMIN 2.8* 2.8*   No results for input(s): "LIPASE", "AMYLASE" in the last 72 hours. CBC: Recent Labs    10/19/22 2051 10/20/22 1040  WBC 6.6 7.4  NEUTROABS 4.8  --   HGB 8.5* 9.1*  HCT 28.7* 30.9*  MCV 88.9 88.5  PLT 249 218   Cardiac Enzymes: Recent Labs    10/19/22 2236  CKTOTAL 250*    Thyroid Function Tests: Recent Labs    10/19/22 2051  TSH 1.355   Anemia  Panel: Recent Labs    10/19/22 2051 10/20/22 0043  VITAMINB12  --  722  FOLATE  --  15.3  FERRITIN  --  235  TIBC  --  206*  IRON  --  20*  RETICCTPCT 1.9  --     Imaging: ECHOCARDIOGRAM COMPLETE  Result Date: 10/20/2022    ECHOCARDIOGRAM REPORT   Patient Name:   [name] Date of Exam: 10/20/2022 Medical Rec #:  [id number]        Height:       59.0 in Accession #:    [id number]       Weight:       135.0 lb Date of Birth:  03-31-1932        BSA:          1.561 m Patient Age:    88 years         BP:           164/65 mmHg Patient Gender: F  HR:           61 bpm. Exam Location:  Inpatient Procedure: 3D Echo, 2D Echo, Cardiac Doppler and Color Doppler Indications:    R07.9* Chest pain, unspecified  History:        Patient has prior history of Echocardiogram examinations, most                 recent 11/28/2019. COPD, Signs/Symptoms:Edema, Alzheimer's,                 Altered Mental Status and Chest Pain; Risk Factors:Hypertension.  Sonographer:    Roseanna Rainbow RDCS Referring Phys: [place]  1. Left ventricular ejection fraction, by estimation, is 60 to 65%. The left ventricle has normal function. The left ventricle has no regional wall motion abnormalities. There is mild left ventricular hypertrophy. Left ventricular diastolic parameters are consistent with Grade II diastolic dysfunction (pseudonormalization).  2. Right ventricular systolic function is mildly reduced. The right ventricular size is mildly enlarged. There is moderately elevated pulmonary artery systolic pressure.  3. Left atrial size was moderately dilated.  4. There is no evidence of cardiac tamponade.  5. No evidence of mitral valve regurgitation.  6. There is mild calcification of the aortic valve. Aortic valve regurgitation is trivial. Aortic valve sclerosis/calcification is present, without any evidence of aortic stenosis.  7. The inferior vena cava is dilated in size with <50% respiratory  variability, suggesting right atrial pressure of 15 mmHg. Comparison(s): No significant change from prior study. FINDINGS  Left Ventricle: Left ventricular ejection fraction, by estimation, is 60 to 65%. The left ventricle has normal function. The left ventricle has no regional wall motion abnormalities. The left ventricular internal cavity size was normal in size. There is  mild left ventricular hypertrophy. Left ventricular diastolic parameters are consistent with Grade II diastolic dysfunction (pseudonormalization). Right Ventricle: The right ventricular size is mildly enlarged. Right ventricular systolic function is mildly reduced. There is moderately elevated pulmonary artery systolic pressure. The tricuspid regurgitant velocity is 2.99 m/s, and with an assumed right atrial pressure of 15 mmHg, the estimated right ventricular systolic pressure is 63.8 mmHg. Left Atrium: Left atrial size was moderately dilated. Right Atrium: Right atrial size was normal in size. Pericardium: Trivial pericardial effusion is present. There is no evidence of cardiac tamponade. Mitral Valve: No evidence of mitral valve regurgitation. Tricuspid Valve: Tricuspid valve regurgitation is mild. Aortic Valve: There is mild calcification of the aortic valve. Aortic valve regurgitation is trivial. Aortic valve sclerosis/calcification is present, without any evidence of aortic stenosis. Aortic valve mean gradient measures 6.0 mmHg. Aortic valve peak gradient measures 11.4 mmHg. Aortic valve area, by VTI measures 2.05 cm. Pulmonic Valve: Pulmonic valve regurgitation is not visualized. Aorta: The aortic root and ascending aorta are structurally normal, with no evidence of dilitation. Venous: The inferior vena cava is dilated in size with less than 50% respiratory variability, suggesting right atrial pressure of 15 mmHg. IAS/Shunts: No atrial level shunt detected by color flow Doppler.  LEFT VENTRICLE PLAX 2D LVIDd:         4.60 cm      Diastology LVIDs:         3.20 cm     LV e' medial:    3.37 cm/s LV PW:         1.20 cm     LV E/e' medial:  26.9 LV IVS:        1.00 cm     LV e' lateral:  4.06 cm/s LVOT diam:     2.00 cm     LV E/e' lateral: 22.3 LV SV:         79 LV SV Index:   51 LVOT Area:     3.14 cm  LV Volumes (MOD) LV vol d, MOD A2C: 97.1 ml LV vol d, MOD A4C: 74.5 ml LV vol s, MOD A2C: 38.0 ml LV vol s, MOD A4C: 27.4 ml LV SV MOD A2C:     59.1 ml LV SV MOD A4C:     74.5 ml LV SV MOD BP:      52.3 ml RIGHT VENTRICLE            IVC RV S prime:     6.50 cm/s  IVC diam: 2.90 cm TAPSE (M-mode): 1.6 cm LEFT ATRIUM             Index        RIGHT ATRIUM           Index LA diam:        4.80 cm 3.08 cm/m   RA Area:     16.70 cm LA Vol (A2C):   68.1 ml 43.64 ml/m  RA Volume:   40.60 ml  26.02 ml/m LA Vol (A4C):   73.8 ml 47.29 ml/m LA Biplane Vol: 71.0 ml 45.49 ml/m  AORTIC VALVE AV Area (Vmax):    2.05 cm AV Area (Vmean):   1.93 cm AV Area (VTI):     2.05 cm AV Vmax:           169.00 cm/s AV Vmean:          111.000 cm/s AV VTI:            0.385 m AV Peak Grad:      11.4 mmHg AV Mean Grad:      6.0 mmHg LVOT Vmax:         110.50 cm/s LVOT Vmean:        68.250 cm/s LVOT VTI:          0.251 m LVOT/AV VTI ratio: 0.65  AORTA Ao Root diam: 2.90 cm Ao Asc diam:  2.80 cm MITRAL VALVE               TRICUSPID VALVE MV Area (PHT): 3.48 cm    TR Peak grad:   35.8 mmHg MV Decel Time: 218 msec    TR Vmax:        299.00 cm/s MV E velocity: 90.60 cm/s MV A velocity: 49.60 cm/s  SHUNTS MV E/A ratio:  1.83        Systemic VTI:  0.25 m                            Systemic Diam: 2.00 cm Phineas Inches Electronically signed by Phineas Inches Signature Date/Time: 10/20/2022/4:04:52 PM    Final    VAS [place] LOWER EXTREMITY VENOUS (DVT)  Result Date: 10/20/2022  Lower Venous DVT Study Patient Name:  [name]  Date of Exam:   10/20/2022 Medical Rec #: [id number]         Accession #:    [id number] Date of Birth: 03/23/1932         Patient Gender: F Patient Age:    46 years Exam Location:  [facility] Procedure:      VAS [place] LOWER EXTREMITY VENOUS (DVT) Referring Phys: Nyoka Lint DOUTOVA --------------------------------------------------------------------------------  Indications: Edema.  Risk Factors:  None identified. Limitations: Poor ultrasound/tissue interface and patient positioning, patient immobility. Comparison Study: No prior studies. Performing Technologist: Oliver Hum RVT  Examination Guidelines: A complete evaluation includes B-mode imaging, spectral Doppler, color Doppler, and power Doppler as needed of all accessible portions of each vessel. Bilateral testing is considered an integral part of a complete examination. Limited examinations for reoccurring indications may be performed as noted. The reflux portion of the exam is performed with the patient in reverse Trendelenburg.  +---------+---------------+---------+-----------+----------+--------------+ RIGHT    CompressibilityPhasicitySpontaneityPropertiesThrombus Aging +---------+---------------+---------+-----------+----------+--------------+ CFV      Full           Yes      Yes                                 +---------+---------------+---------+-----------+----------+--------------+ SFJ      Full                                                        +---------+---------------+---------+-----------+----------+--------------+ FV Prox  Full                                                        +---------+---------------+---------+-----------+----------+--------------+ FV Mid   Full                                                        +---------+---------------+---------+-----------+----------+--------------+ FV Distal               Yes      Yes                                 +---------+---------------+---------+-----------+----------+--------------+ PFV      Full                                                         +---------+---------------+---------+-----------+----------+--------------+ POP      Full           Yes      Yes                                 +---------+---------------+---------+-----------+----------+--------------+ PTV      Full                                                        +---------+---------------+---------+-----------+----------+--------------+ PERO     Full                                                        +---------+---------------+---------+-----------+----------+--------------+   +---------+---------------+---------+-----------+----------+--------------+  LEFT     CompressibilityPhasicitySpontaneityPropertiesThrombus Aging +---------+---------------+---------+-----------+----------+--------------+ CFV      Full           Yes      Yes                                 +---------+---------------+---------+-----------+----------+--------------+ SFJ      Full                                                        +---------+---------------+---------+-----------+----------+--------------+ FV Prox  Full                                                        +---------+---------------+---------+-----------+----------+--------------+ FV Mid   Full                                                        +---------+---------------+---------+-----------+----------+--------------+ FV Distal               Yes      Yes                                 +---------+---------------+---------+-----------+----------+--------------+ PFV      Full                                                        +---------+---------------+---------+-----------+----------+--------------+ POP      Full           Yes      Yes                                 +---------+---------------+---------+-----------+----------+--------------+ PTV      Full                                                         +---------+---------------+---------+-----------+----------+--------------+ PERO     Full                                                        +---------+---------------+---------+-----------+----------+--------------+     Summary: RIGHT: - There is no evidence of deep vein thrombosis in the lower extremity. However, portions of this examination were limited- see technologist comments above.  - No cystic structure found in the popliteal fossa.  LEFT: - There is no evidence of deep  vein thrombosis in the lower extremity. However, portions of this examination were limited- see technologist comments above.  - No cystic structure found in the popliteal fossa.  *See table(s) above for measurements and observations. Electronically signed by Deitra Mayo MD on 10/20/2022 at 3:34:37 PM.    Final     Cardiac Studies:  ECG: Sinus with inferior lateral T wave changes    Telemetry:  NSR   Echo: EF 83-29% grade 2 diastolic   Medications:    azithromycin  500 mg Oral QHS   furosemide  40 mg Intravenous Daily   guaiFENesin  600 mg Oral BID   hydrALAZINE  50 mg Oral BID   isosorbide mononitrate  30 mg Oral Daily   OLANZapine  5 mg Oral QHS   pantoprazole  40 mg Oral Daily   predniSONE  40 mg Oral Q breakfast   sodium chloride flush  3 mL Intravenous Q12H   tuberculin  5 Units Intradermal Once      sodium chloride     cefTRIAXone (ROCEPHIN)  IV 2 g (10/21/22 2130)    Assessment/Plan:   [name] is a 88 y.o. female with a hx of dementia, chronic combined systolic and diastolic HF with improved, venous insufficiency with chronic LC edema, HTN, COPD, MGUS, CKD who is being seen 10/20/2022 for the evaluation of acute heart failure exacerbation at the request of Dr. Maylene Roes.   HFpEF:  EF improved compared to TTE done in 2015 Change lasix to PO  PNA/COPD  Rocephin with steroids HTN:  imprvoed with hydralazine   Jenkins Rouge 10/22/2022, 7:57 AM

## 2022-10-22 NOTE — Plan of Care (Signed)
  Problem: Education: Goal: Ability to demonstrate management of disease process will improve Outcome: Progressing Goal: Ability to verbalize understanding of medication therapies will improve Outcome: Progressing Goal: Individualized Educational Video(s) Outcome: Progressing   Problem: Activity: Goal: Capacity to carry out activities will improve Outcome: Progressing   Problem: Cardiac: Goal: Ability to achieve and maintain adequate cardiopulmonary perfusion will improve Outcome: Progressing   Problem: Education: Goal: Knowledge of disease or condition will improve Outcome: Progressing Goal: Knowledge of the prescribed therapeutic regimen will improve Outcome: Progressing Goal: Individualized Educational Video(s) Outcome: Progressing   Problem: Activity: Goal: Ability to tolerate increased activity will improve Outcome: Progressing Goal: Will verbalize the importance of balancing activity with adequate rest periods Outcome: Progressing   Problem: Respiratory: Goal: Ability to maintain a clear airway will improve Outcome: Progressing Goal: Levels of oxygenation will improve Outcome: Progressing Goal: Ability to maintain adequate ventilation will improve Outcome: Progressing   Problem: Education: Goal: Knowledge of General Education information will improve Description: Including pain rating scale, medication(s)/side effects and non-pharmacologic comfort measures Outcome: Progressing   Problem: Health Behavior/Discharge Planning: Goal: Ability to manage health-related needs will improve Outcome: Progressing   Problem: Clinical Measurements: Goal: Ability to maintain clinical measurements within normal limits will improve Outcome: Progressing Goal: Will remain free from infection Outcome: Progressing Goal: Diagnostic test results will improve Outcome: Progressing Goal: Respiratory complications will improve Outcome: Progressing Goal: Cardiovascular complication  will be avoided Outcome: Progressing   Problem: Activity: Goal: Risk for activity intolerance will decrease Outcome: Progressing   Problem: Nutrition: Goal: Adequate nutrition will be maintained Outcome: Progressing   Problem: Coping: Goal: Level of anxiety will decrease Outcome: Progressing   Problem: Elimination: Goal: Will not experience complications related to bowel motility Outcome: Progressing Goal: Will not experience complications related to urinary retention Outcome: Progressing   Problem: Pain Managment: Goal: General experience of comfort will improve Outcome: Progressing   Problem: Safety: Goal: Ability to remain free from injury will improve Outcome: Progressing   Problem: Skin Integrity: Goal: Risk for impaired skin integrity will decrease Outcome: Progressing   Problem: Education: Goal: Knowledge of General Education information will improve Description: Including pain rating scale, medication(s)/side effects and non-pharmacologic comfort measures Outcome: Progressing   Problem: Health Behavior/Discharge Planning: Goal: Ability to manage health-related needs will improve Outcome: Progressing   Problem: Clinical Measurements: Goal: Ability to maintain clinical measurements within normal limits will improve Outcome: Progressing Goal: Will remain free from infection Outcome: Progressing Goal: Diagnostic test results will improve Outcome: Progressing Goal: Respiratory complications will improve Outcome: Progressing Goal: Cardiovascular complication will be avoided Outcome: Progressing   Problem: Activity: Goal: Risk for activity intolerance will decrease Outcome: Progressing   Problem: Nutrition: Goal: Adequate nutrition will be maintained Outcome: Progressing   Problem: Elimination: Goal: Will not experience complications related to bowel motility Outcome: Progressing Goal: Will not experience complications related to urinary  retention Outcome: Progressing   Problem: Pain Managment: Goal: General experience of comfort will improve Outcome: Progressing   Problem: Safety: Goal: Ability to remain free from injury will improve Outcome: Progressing   Problem: Skin Integrity: Goal: Risk for impaired skin integrity will decrease Outcome: Progressing

## 2022-10-22 NOTE — Progress Notes (Signed)
Initial Nutrition Assessment RD working remotely.   DOCUMENTATION CODES:   Not applicable  INTERVENTION:  - ordered Ensure Enlive BID, each supplement provides 350 kcal and 20 grams of protein.  - ordered 1 tablet multivitamin with minerals/day.  - complete NFPE when feasible.   NUTRITION DIAGNOSIS:   Increased nutrient needs related to acute illness as evidenced by estimated needs.  GOAL:   Patient will meet greater than or equal to 90% of their needs  MONITOR:   PO intake, Supplement acceptance, Labs, Weight trends  REASON FOR ASSESSMENT:   Malnutrition Screening Tool, Consult Assessment of nutrition requirement/status  ASSESSMENT:   88 year-old female with medical history of dementia, GERD, HTN, CKD, obstructive chronic bronchitis, peripheral venous insufficiency, vitamin D deficiency, anxiety, arthritis, asthma, depression. She presented to the ED due to confusion noted by her family. In the ED she was found to be wheezing. She was started on steroids and empiric antibiotics. Cardiology consulted for CHF exacerbation. She lives at home alone and has 14-15 hours/day of assistance from caregivers.  Patient noted to be a/o to self only. She has not been assessed by a [place] RD since 03/2015.  She ate 100% of breakfast, 50% of lunch, and 0% of dinner yesterday and 10% of breakfast today.  Weight on 12/21 was 135 lb (appears to be a stated weight), weight on 12/23 was 142 lb, and weight appears to mainly be stable since 06/18/20. Yet, of note, moderate pitting edema to BLE documented in the edema section of flow sheet.  Per notes: - acute metabolic encephalopathy for patient with underlying dementia - CAP - COPD exacerbation - acute on chronic heart failure - TB requested by family member--ordered 12/22   Labs reviewed; BUN: 48 mg/dl, creatinine: 1.67 mg/dl, Phos: 5.1 mg/dl, GFR: 29 ml/min. Medications reviewed; 40 mg oral lasix/day, 40 mg oral protonix/day, 40  mg prednisone/day.    NUTRITION - FOCUSED PHYSICAL EXAM:  RD working remotely.  Diet Order:   Diet Order             Diet Heart Room service appropriate? Yes; Fluid consistency: Thin  Diet effective now                   EDUCATION NEEDS:   Not appropriate for education at this time  Skin:  Skin Assessment: Reviewed RN Assessment  Last BM:  12/23 (type 6 x1, medium amount)  Height:   Ht Readings from Last 1 Encounters:  10/19/22 '4\' 11"'$  (1.499 m)    Weight:   Wt Readings from Last 1 Encounters:  10/21/22 64.4 kg    BMI:  Body mass index is 28.68 kg/m.  Estimated Nutritional Needs:  Kcal:  1400-1600 kcal Protein:  70-80 grams Fluid:  >/= 1.6 L/day     Jarome Matin, MS, RD, LDN, CNSC Clinical Dietitian PRN/Relief staff On-call/weekend pager # available in [facility]

## 2022-10-22 NOTE — Progress Notes (Signed)
PROGRESS NOTE    [name]  LNL:[id number] DOB: 01-Nov-1931 DOA: 10/19/2022 PCP: Lauree Chandler, NP     Brief Narrative:  [name] is an 88 year old female with past medical history significant for dementia, GERD, HTN, CKD, who present to the hospital with confusion. Per family, they reported that patient was not at her baseline and not getting up as normal.  In the emergency department, she was found to have wheezing.  She was started on steroids as well as empiric antibiotics.  She had elevated BNP and was also ordered for Lasix and cardiology consulted for CHF exacerbation.  Patient lives at home alone, and between caregivers, probably has around 14 to 15 hours of assistance at home. Family is requesting patient to be placed in a facility due to her safety concerns and does not have 24-hour care.   New events last 24 hours / Subjective: Sleeping comfortably, easily awakened.  She has no complaints today.  Remains stable on room air  Assessment & Plan:   Principal Problem:   CAP (community acquired pneumonia) Active Problems:   Essential hypertension   MGUS (monoclonal gammopathy of unknown significance)   CKD (chronic kidney disease) stage 3, GFR 30-59 ml/min (HCC)   COPD with acute exacerbation (HCC)   Acute on chronic combined systolic (congestive) and diastolic (congestive) heart failure (HCC)   Dementia with behavioral disturbance (HCC)   Normocytic anemia   Acute metabolic encephalopathy   Elevated troponin   Acute metabolic encephalopathy -Insetting of underlying dementia, pneumonia, heart failure -Zyprexa  -Seems to be her baseline now  CAP -COVID, influenza, RVP negative -Strep pneumo antigen negative -Blood culture 1 bottle showing staph epidermidis - contaminant  -Rocephin, azithromycin -Procalcitonin trending downward  COPD exacerbation -Solu-Medrol, de-escalate to prednisone  Acute on chronic combined systolic and diastolic heart  failure -PO Lasix, hydralazine, Imdur -Cardiology consulted -Echocardiogram showing grade 2 diastolic dysfunction  Hypertension -Hydralazine, add norvasc   CKD stage IIIb -Baseline creatinine 1.3 -Slightly trending upward in setting of lasix use. Monitor   Demand ischemia -Insetting of pneumonia, COPD, CHF  TB test -Requested by family member -Ordered 12/22    DVT prophylaxis:  SCDs Start: 10/20/22 0819  Code Status: Full Family Communication: None at bedside Disposition Plan:  Status is: Inpatient Remains inpatient appropriate because: SNF placement   Antimicrobials:  Anti-infectives (From admission, onward)    Start     Dose/Rate Route Frequency Ordered Stop   10/21/22 2200  azithromycin (ZITHROMAX) tablet 500 mg        500 mg Oral Daily at bedtime 10/21/22 1101 10/24/22 2159   10/20/22 2300  cefTRIAXone (ROCEPHIN) 2 g in sodium chloride 0.9 % 100 mL IVPB        2 g 200 mL/hr over 30 Minutes Intravenous Every 24 hours 10/19/22 2338 10/25/22 2259   10/20/22 2300  azithromycin (ZITHROMAX) 500 mg in sodium chloride 0.9 % 250 mL IVPB  Status:  Discontinued        500 mg 250 mL/hr over 60 Minutes Intravenous Every 24 hours 10/19/22 2338 10/21/22 1101   10/19/22 2300  cefTRIAXone (ROCEPHIN) 1 g in sodium chloride 0.9 % 100 mL IVPB        1 g 200 mL/hr over 30 Minutes Intravenous  Once 10/19/22 2255 10/20/22 0048   10/19/22 2300  azithromycin (ZITHROMAX) 500 mg in sodium chloride 0.9 % 250 mL IVPB        500 mg 250 mL/hr over 60 Minutes  Intravenous  Once 10/19/22 2255 10/20/22 0546        Objective: Vitals:   10/21/22 1450 10/21/22 2033 10/22/22 0625 10/22/22 1026  BP: (!) 155/77 (!) 161/95 (!) 162/89 (!) 187/86  Pulse: 69 65 (!) 53 (!) 58  Resp: '16 18 20 20  '$ Temp: (!) 97.5 F (36.4 C) 97.6 F (36.4 C) 97.6 F (36.4 C) 97.7 F (36.5 C)  TempSrc: Oral Oral  Oral  SpO2: 100% 100% 100% 100%  Weight:      Height:        Intake/Output Summary (Last 24  hours) at 10/22/2022 1129 Last data filed at 10/22/2022 1100 Gross per 24 hour  Intake 974.87 ml  Output 502 ml  Net 472.87 ml    Filed Weights   10/19/22 2224 10/21/22 0500  Weight: 61.2 kg 64.4 kg    Examination:  General exam: Appears calm and comfortable  Respiratory system: CTAB anteriorly, on room air without distress  Cardiovascular system: S1 & S2 heard, RRR. No murmurs. + pedal edema. Gastrointestinal system: Abdomen is nondistended, soft and nontender. Normal bowel sounds heard. Extremities: Symmetric in appearance  Skin: +chronic venous stasis   Data Reviewed: I have personally reviewed following labs and imaging studies  CBC: Recent Labs  Lab 10/19/22 2051 10/20/22 1040  WBC 6.6 7.4  NEUTROABS 4.8  --   HGB 8.5* 9.1*  HCT 28.7* 30.9*  MCV 88.9 88.5  PLT 249 329    Basic Metabolic Panel: Recent Labs  Lab 10/19/22 2051 10/19/22 2236 10/20/22 1040 10/21/22 1043 10/22/22 0553  NA 144  --  142 141 137  K 3.9  --  4.2 4.0 3.8  CL 114*  --  111 110 109  CO2 21*  --  19* 20* 22  GLUCOSE 73  --  133* 198* 183*  BUN 22  --  24* 37* 48*  CREATININE 1.32*  --  1.53* 1.61* 1.67*  CALCIUM 9.2  --  9.1 9.2 9.1  MG  --  1.9 2.2  --   --   PHOS  --  3.6 5.1*  --   --     GFR: Estimated Creatinine Clearance: 19 mL/min (A) (by C-G formula based on SCr of 1.67 mg/dL (H)). Liver Function Tests: Recent Labs  Lab 10/19/22 2051 10/20/22 1040  AST 20 20  ALT 14 13  ALKPHOS 64 57  BILITOT 0.7 0.7  PROT 7.7 8.0  ALBUMIN 2.8* 2.8*    No results for input(s): "LIPASE", "AMYLASE" in the last 168 hours. Recent Labs  Lab 10/20/22 0043  AMMONIA 15    Coagulation Profile: No results for input(s): "INR", "PROTIME" in the last 168 hours. Cardiac Enzymes: Recent Labs  Lab 10/19/22 2236  CKTOTAL 250*    BNP (last 3 results) No results for input(s): "PROBNP" in the last 8760 hours. HbA1C: No results for input(s): "HGBA1C" in the last 72 hours. CBG: No  results for input(s): "GLUCAP" in the last 168 hours. Lipid Profile: No results for input(s): "CHOL", "HDL", "LDLCALC", "TRIG", "CHOLHDL", "LDLDIRECT" in the last 72 hours. Thyroid Function Tests: Recent Labs    10/19/22 2051  TSH 1.355    Anemia Panel: Recent Labs    10/19/22 2051 10/20/22 0043  VITAMINB12  --  722  FOLATE  --  15.3  FERRITIN  --  235  TIBC  --  206*  IRON  --  20*  RETICCTPCT 1.9  --     Sepsis Labs: Recent Labs  Lab  10/19/22 2051 10/19/22 2217 10/19/22 2236 10/20/22 1040 10/22/22 0553  PROCALCITON  --   --  2.43 1.29 0.70  LATICACIDVEN 1.5 1.5  --   --   --      Recent Results (from the past 240 hour(s))  Resp panel by RT-PCR (RSV, Flu A&B, Covid) Anterior Nasal Swab     Status: None   Collection Time: 10/19/22  8:51 PM   Specimen: Anterior Nasal Swab  Result Value Ref Range Status   SARS Coronavirus 2 by RT PCR NEGATIVE NEGATIVE Final    Comment: (NOTE) SARS-CoV-2 target nucleic acids are NOT DETECTED.  The SARS-CoV-2 RNA is generally detectable in upper respiratory specimens during the acute phase of infection. The lowest concentration of SARS-CoV-2 viral copies this assay can detect is 138 copies/mL. A negative result does not preclude SARS-Cov-2 infection and should not be used as the sole basis for treatment or other patient management decisions. A negative result may occur with  improper specimen collection/handling, submission of specimen other than nasopharyngeal swab, presence of viral mutation(s) within the areas targeted by this assay, and inadequate number of viral copies(<138 copies/mL). A negative result must be combined with clinical observations, patient history, and epidemiological information. The expected result is Negative.  Fact Sheet for Patients:  EntrepreneurPulse.com.au  Fact Sheet for Healthcare Providers:  IncredibleEmployment.be  This test is no t yet approved or cleared  by the [place] FDA and  has been authorized for detection and/or diagnosis of SARS-CoV-2 by FDA under an Emergency Use Authorization (EUA). This EUA will remain  in effect (meaning this test can be used) for the duration of the COVID-19 declaration under Section 564(b)(1) of the Act, 21 U.S.C.section 360bbb-3(b)(1), unless the authorization is terminated  or revoked sooner.       Influenza A by PCR NEGATIVE NEGATIVE Final   Influenza B by PCR NEGATIVE NEGATIVE Final    Comment: (NOTE) The Xpert Xpress SARS-CoV-2/FLU/RSV plus assay is intended as an aid in the diagnosis of influenza from Nasopharyngeal swab specimens and should not be used as a sole basis for treatment. Nasal washings and aspirates are unacceptable for Xpert Xpress SARS-CoV-2/FLU/RSV testing.  Fact Sheet for Patients: EntrepreneurPulse.com.au  Fact Sheet for Healthcare Providers: IncredibleEmployment.be  This test is not yet approved or cleared by the [place] FDA and has been authorized for detection and/or diagnosis of SARS-CoV-2 by FDA under an Emergency Use Authorization (EUA). This EUA will remain in effect (meaning this test can be used) for the duration of the COVID-19 declaration under Section 564(b)(1) of the Act, 21 U.S.C. section 360bbb-3(b)(1), unless the authorization is terminated or revoked.     Resp Syncytial Virus by PCR NEGATIVE NEGATIVE Final    Comment: (NOTE) Fact Sheet for Patients: EntrepreneurPulse.com.au  Fact Sheet for Healthcare Providers: IncredibleEmployment.be  This test is not yet approved or cleared by the [place] FDA and has been authorized for detection and/or diagnosis of SARS-CoV-2 by FDA under an Emergency Use Authorization (EUA). This EUA will remain in effect (meaning this test can be used) for the duration of the COVID-19 declaration under Section 564(b)(1) of the Act, 21  U.S.C. section 360bbb-3(b)(1), unless the authorization is terminated or revoked.  Performed at [facility], [place] [address], [place], [place] [address]   Blood culture (routine x 2)     Status: None (Preliminary result)   Collection Time: 10/19/22 10:15 PM   Specimen: BLOOD  Result Value Ref Range Status   Specimen  Description   Final    BLOOD LEFT ANTECUBITAL Performed at [facility] [address], [place], [place] [address]    Special Requests   Final    BOTTLES DRAWN AEROBIC AND ANAEROBIC Blood Culture results may not be optimal due to an inadequate volume of blood received in culture bottles Performed at [facility] [address] [place], [place] [address]    Culture   Final    NO GROWTH 2 DAYS Performed at [facility] [address], [place], [place] [address]    Report Status PENDING  Incomplete  Blood culture (routine x 2)     Status: Abnormal (Preliminary result)   Collection Time: 10/19/22 10:17 PM   Specimen: BLOOD  Result Value Ref Range Status   Specimen Description   Final    BLOOD RIGHT ANTECUBITAL Performed at [facility] [address] [place], [place] [address]    Special Requests   Final    BOTTLES DRAWN AEROBIC AND ANAEROBIC Blood Culture adequate volume Performed at [facility] [address], [place], [place] [address]    Culture  Setup Time   Final    GRAM POSITIVE COCCI AEROBIC BOTTLE ONLY CRITICAL RESULT CALLED TO, READ BACK BY AND VERIFIED WITH: L POINDEXTER,PHARMD'@0419'$  10/21/22 [place]    Culture (A)  Final    STAPHYLOCOCCUS EPIDERMIDIS THE SIGNIFICANCE OF ISOLATING THIS ORGANISM FROM A SINGLE SET OF BLOOD CULTURES WHEN MULTIPLE SETS ARE DRAWN IS UNCERTAIN. PLEASE NOTIFY THE MICROBIOLOGY DEPARTMENT WITHIN ONE WEEK IF SPECIATION AND SENSITIVITIES ARE REQUIRED. Performed at [facility], [place] [address] [place], [place] [address]     Report Status PENDING  Incomplete  Blood Culture ID Panel (Reflexed)     Status: Abnormal   Collection Time: 10/19/22 10:17 PM  Result Value Ref Range Status   Enterococcus faecalis NOT DETECTED NOT DETECTED Final   Enterococcus Faecium NOT DETECTED NOT DETECTED Final   Listeria monocytogenes NOT DETECTED NOT DETECTED Final   Staphylococcus species DETECTED (A) NOT DETECTED Final    Comment: CRITICAL RESULT CALLED TO, READ BACK BY AND VERIFIED WITH: L POINDEXTER,PHARMD'@0420'$  10/21/22 [place]    Staphylococcus aureus (BCID) NOT DETECTED NOT DETECTED Final   Staphylococcus epidermidis DETECTED (A) NOT DETECTED Final    Comment: CRITICAL RESULT CALLED TO, READ BACK BY AND VERIFIED WITH: L POINDEXTER,PHARMD'@0420'$  10/21/22 [place]    Staphylococcus lugdunensis NOT DETECTED NOT DETECTED Final   Streptococcus species NOT DETECTED NOT DETECTED Final   Streptococcus agalactiae NOT DETECTED NOT DETECTED Final   Streptococcus pneumoniae NOT DETECTED NOT DETECTED Final   Streptococcus pyogenes NOT DETECTED NOT DETECTED Final   A.calcoaceticus-baumannii NOT DETECTED NOT DETECTED Final   Bacteroides fragilis NOT DETECTED NOT DETECTED Final   Enterobacterales NOT DETECTED NOT DETECTED Final   Enterobacter cloacae complex NOT DETECTED NOT DETECTED Final   Escherichia coli NOT DETECTED NOT DETECTED Final   Klebsiella aerogenes NOT DETECTED NOT DETECTED Final   Klebsiella oxytoca NOT DETECTED NOT DETECTED Final   Klebsiella pneumoniae NOT DETECTED NOT DETECTED Final   Proteus species NOT DETECTED NOT DETECTED Final   Salmonella species NOT DETECTED NOT DETECTED Final   Serratia marcescens NOT DETECTED NOT DETECTED Final   Haemophilus influenzae NOT DETECTED NOT DETECTED Final   Neisseria meningitidis NOT DETECTED NOT DETECTED Final   Pseudomonas aeruginosa NOT DETECTED NOT DETECTED Final   Stenotrophomonas maltophilia NOT DETECTED NOT DETECTED Final   Candida albicans NOT DETECTED NOT  DETECTED Final   Candida  auris NOT DETECTED NOT DETECTED Final   Candida glabrata NOT DETECTED NOT DETECTED Final   Candida krusei NOT DETECTED NOT DETECTED Final   Candida parapsilosis NOT DETECTED NOT DETECTED Final   Candida tropicalis NOT DETECTED NOT DETECTED Final   Cryptococcus neoformans/gattii NOT DETECTED NOT DETECTED Final   Methicillin resistance mecA/C NOT DETECTED NOT DETECTED Final    Comment: Performed at [facility], [place] [address], [place], [place] [address]  Expectorated Sputum Assessment w Gram Stain, Rflx to Resp Cult     Status: None   Collection Time: 10/19/22 10:36 PM   Specimen: Expectorated Sputum  Result Value Ref Range Status   Specimen Description EXPECTORATED SPUTUM  Final   Special Requests NONE  Final   Sputum evaluation   Final    THIS SPECIMEN IS ACCEPTABLE FOR SPUTUM CULTURE Performed at [facility], [place] [address] [place], [place] [address]    Report Status 10/19/2022 FINAL  Final  Culture, Respiratory w Gram Stain     Status: None   Collection Time: 10/19/22 10:36 PM  Result Value Ref Range Status   Specimen Description   Final    EXPECTORATED SPUTUM Performed at [facility], [place] [address] [place], [place] [address]    Special Requests   Final    NONE Reflexed from [id number] Performed at [facility], [place] [address], [place], [place] [address]    Gram Stain   Final    FEW SQUAMOUS EPITHELIAL CELLS PRESENT MODERATE WBC PRESENT,BOTH PMN AND MONONUCLEAR MODERATE GRAM NEGATIVE RODS MODERATE GRAM POSITIVE COCCI IN PAIRS    Culture   Final    ABUNDANT Consistent with normal respiratory flora. No Pseudomonas species isolated Performed at [facility] [address] [place], [place] [address]    Report Status 10/22/2022 FINAL  Final  Respiratory (~20 pathogens) panel by PCR     Status: None   Collection Time: 10/20/22 12:43 AM   Specimen: Nasopharyngeal Swab; Respiratory  Result Value Ref Range  Status   Adenovirus NOT DETECTED NOT DETECTED Final   Coronavirus 229E NOT DETECTED NOT DETECTED Final    Comment: (NOTE) The Coronavirus on the Respiratory Panel, DOES NOT test for the novel  Coronavirus (2019 nCoV)    Coronavirus HKU1 NOT DETECTED NOT DETECTED Final   Coronavirus NL63 NOT DETECTED NOT DETECTED Final   Coronavirus OC43 NOT DETECTED NOT DETECTED Final   Metapneumovirus NOT DETECTED NOT DETECTED Final   Rhinovirus / Enterovirus NOT DETECTED NOT DETECTED Final   Influenza A NOT DETECTED NOT DETECTED Final   Influenza B NOT DETECTED NOT DETECTED Final   Parainfluenza Virus 1 NOT DETECTED NOT DETECTED Final   Parainfluenza Virus 2 NOT DETECTED NOT DETECTED Final   Parainfluenza Virus 3 NOT DETECTED NOT DETECTED Final   Parainfluenza Virus 4 NOT DETECTED NOT DETECTED Final   Respiratory Syncytial Virus NOT DETECTED NOT DETECTED Final   Bordetella pertussis NOT DETECTED NOT DETECTED Final   Bordetella Parapertussis NOT DETECTED NOT DETECTED Final   Chlamydophila pneumoniae NOT DETECTED NOT DETECTED Final   Mycoplasma pneumoniae NOT DETECTED NOT DETECTED Final    Comment: Performed at [facility], [place]. [address] [place], [place] [address]      Radiology Studies: ECHOCARDIOGRAM COMPLETE  Result Date: 10/20/2022    ECHOCARDIOGRAM REPORT   Patient Name:   [name] Date of Exam: 10/20/2022 Medical Rec #:  [id number]  Height:       59.0 in Accession #:    [id number]       Weight:       135.0 lb Date of Birth:  [birth year]        BSA:          1.561 m Patient Age:    48 years         BP:           164/65 mmHg Patient Gender: F                HR:           61 bpm. Exam Location:  Inpatient Procedure: 3D Echo, 2D Echo, Cardiac Doppler and Color Doppler Indications:    R07.9* Chest pain, unspecified  History:        Patient has prior history of Echocardiogram examinations, most                 recent 11/28/2019. COPD, Signs/Symptoms:Edema, Alzheimer's,                  Altered Mental Status and Chest Pain; Risk Factors:Hypertension.  Sonographer:    Roseanna Rainbow RDCS Referring Phys: [place]  1. Left ventricular ejection fraction, by estimation, is 60 to 65%. The left ventricle has normal function. The left ventricle has no regional wall motion abnormalities. There is mild left ventricular hypertrophy. Left ventricular diastolic parameters are consistent with Grade II diastolic dysfunction (pseudonormalization).  2. Right ventricular systolic function is mildly reduced. The right ventricular size is mildly enlarged. There is moderately elevated pulmonary artery systolic pressure.  3. Left atrial size was moderately dilated.  4. There is no evidence of cardiac tamponade.  5. No evidence of mitral valve regurgitation.  6. There is mild calcification of the aortic valve. Aortic valve regurgitation is trivial. Aortic valve sclerosis/calcification is present, without any evidence of aortic stenosis.  7. The inferior vena cava is dilated in size with <50% respiratory variability, suggesting right atrial pressure of 15 mmHg. Comparison(s): No significant change from prior study. FINDINGS  Left Ventricle: Left ventricular ejection fraction, by estimation, is 60 to 65%. The left ventricle has normal function. The left ventricle has no regional wall motion abnormalities. The left ventricular internal cavity size was normal in size. There is  mild left ventricular hypertrophy. Left ventricular diastolic parameters are consistent with Grade II diastolic dysfunction (pseudonormalization). Right Ventricle: The right ventricular size is mildly enlarged. Right ventricular systolic function is mildly reduced. There is moderately elevated pulmonary artery systolic pressure. The tricuspid regurgitant velocity is 2.99 m/s, and with an assumed right atrial pressure of 15 mmHg, the estimated right ventricular systolic pressure is 75.6 mmHg. Left Atrium: Left atrial size was  moderately dilated. Right Atrium: Right atrial size was normal in size. Pericardium: Trivial pericardial effusion is present. There is no evidence of cardiac tamponade. Mitral Valve: No evidence of mitral valve regurgitation. Tricuspid Valve: Tricuspid valve regurgitation is mild. Aortic Valve: There is mild calcification of the aortic valve. Aortic valve regurgitation is trivial. Aortic valve sclerosis/calcification is present, without any evidence of aortic stenosis. Aortic valve mean gradient measures 6.0 mmHg. Aortic valve peak gradient measures 11.4 mmHg. Aortic valve area, by VTI measures 2.05 cm. Pulmonic Valve: Pulmonic valve regurgitation is not visualized. Aorta: The aortic root and ascending aorta are structurally normal, with no evidence of dilitation. Venous: The inferior vena cava is dilated in size with less than 50% respiratory variability,  suggesting right atrial pressure of 15 mmHg. IAS/Shunts: No atrial level shunt detected by color flow Doppler.  LEFT VENTRICLE PLAX 2D LVIDd:         4.60 cm     Diastology LVIDs:         3.20 cm     LV e' medial:    3.37 cm/s LV PW:         1.20 cm     LV E/e' medial:  26.9 LV IVS:        1.00 cm     LV e' lateral:   4.06 cm/s LVOT diam:     2.00 cm     LV E/e' lateral: 22.3 LV SV:         79 LV SV Index:   51 LVOT Area:     3.14 cm  LV Volumes (MOD) LV vol d, MOD A2C: 97.1 ml LV vol d, MOD A4C: 74.5 ml LV vol s, MOD A2C: 38.0 ml LV vol s, MOD A4C: 27.4 ml LV SV MOD A2C:     59.1 ml LV SV MOD A4C:     74.5 ml LV SV MOD BP:      52.3 ml RIGHT VENTRICLE            IVC RV S prime:     6.50 cm/s  IVC diam: 2.90 cm TAPSE (M-mode): 1.6 cm LEFT ATRIUM             Index        RIGHT ATRIUM           Index LA diam:        4.80 cm 3.08 cm/m   RA Area:     16.70 cm LA Vol (A2C):   68.1 ml 43.64 ml/m  RA Volume:   40.60 ml  26.02 ml/m LA Vol (A4C):   73.8 ml 47.29 ml/m LA Biplane Vol: 71.0 ml 45.49 ml/m  AORTIC VALVE AV Area (Vmax):    2.05 cm AV Area (Vmean):    1.93 cm AV Area (VTI):     2.05 cm AV Vmax:           169.00 cm/s AV Vmean:          111.000 cm/s AV VTI:            0.385 m AV Peak Grad:      11.4 mmHg AV Mean Grad:      6.0 mmHg LVOT Vmax:         110.50 cm/s LVOT Vmean:        68.250 cm/s LVOT VTI:          0.251 m LVOT/AV VTI ratio: 0.65  AORTA Ao Root diam: 2.90 cm Ao Asc diam:  2.80 cm MITRAL VALVE               TRICUSPID VALVE MV Area (PHT): 3.48 cm    TR Peak grad:   35.8 mmHg MV Decel Time: 218 msec    TR Vmax:        299.00 cm/s MV E velocity: 90.60 cm/s MV A velocity: 49.60 cm/s  SHUNTS MV E/A ratio:  1.83        Systemic VTI:  0.25 m                            Systemic Diam: 2.00 cm Phineas Inches Electronically signed by Phineas Inches Signature Date/Time: 10/20/2022/4:04:52 PM  Final       Scheduled Meds:  azithromycin  500 mg Oral QHS   furosemide  40 mg Oral Daily   guaiFENesin  600 mg Oral BID   hydrALAZINE  50 mg Oral BID   isosorbide mononitrate  30 mg Oral Daily   OLANZapine  5 mg Oral QHS   pantoprazole  40 mg Oral Daily   predniSONE  40 mg Oral Q breakfast   sodium chloride flush  3 mL Intravenous Q12H   tuberculin  5 Units Intradermal Once   Continuous Infusions:  sodium chloride     cefTRIAXone (ROCEPHIN)  IV 2 g (10/21/22 2130)     LOS: 3 days   Time spent: 25 minutes   Dessa Phi, DO [facility] 10/22/2022, 11:29 AM   Available via Epic secure chat 7am-7pm After these hours, please refer to coverage provider listed on [email or web address]

## 2022-10-23 DIAGNOSIS — I5033 Acute on chronic diastolic (congestive) heart failure: Secondary | ICD-10-CM | POA: Diagnosis not present

## 2022-10-23 DIAGNOSIS — J189 Pneumonia, unspecified organism: Secondary | ICD-10-CM | POA: Diagnosis not present

## 2022-10-23 LAB — CULTURE, BLOOD (ROUTINE X 2): Special Requests: ADEQUATE

## 2022-10-23 LAB — BASIC METABOLIC PANEL
Anion gap: 9 (ref 5–15)
BUN: 48 mg/dL — ABNORMAL HIGH (ref 8–23)
CO2: 23 mmol/L (ref 22–32)
Calcium: 9.1 mg/dL (ref 8.9–10.3)
Chloride: 109 mmol/L (ref 98–111)
Creatinine, Ser: 1.79 mg/dL — ABNORMAL HIGH (ref 0.44–1.00)
GFR, Estimated: 27 mL/min — ABNORMAL LOW (ref >60)
Glucose, Bld: 157 mg/dL — ABNORMAL HIGH (ref 70–99)
Potassium: 3.9 mmol/L (ref 3.5–5.1)
Sodium: 141 mmol/L (ref 135–145)

## 2022-10-23 MED ORDER — IPRATROPIUM-ALBUTEROL 0.5-2.5 (3) MG/3ML IN SOLN: 3.0000 mL | RESPIRATORY_TRACT | Status: DC | PRN

## 2022-10-23 MED ORDER — SODIUM CHLORIDE 0.9 % IV SOLN: INTRAVENOUS | Status: AC

## 2022-10-23 MED ORDER — TORSEMIDE 10 MG PO TABS
10.0000 mg | ORAL_TABLET | Freq: Every day | ORAL | Status: DC
  Administered 2022-10-24 – 2022-10-25 (×2): 10 mg via ORAL
  Filled 2022-10-23 (×2): qty 1

## 2022-10-23 MED ORDER — TORSEMIDE 10 MG PO TABS
10.0000 mg | ORAL_TABLET | Freq: Every day | ORAL | Status: DC
  Filled 2022-10-23: qty 1

## 2022-10-23 NOTE — Plan of Care (Signed)
  Problem: Education: Goal: Ability to demonstrate management of disease process will improve Outcome: Progressing Goal: Ability to verbalize understanding of medication therapies will improve Outcome: Progressing Goal: Individualized Educational Video(s) Outcome: Progressing   Problem: Activity: Goal: Capacity to carry out activities will improve Outcome: Progressing   Problem: Cardiac: Goal: Ability to achieve and maintain adequate cardiopulmonary perfusion will improve Outcome: Progressing   Problem: Education: Goal: Knowledge of disease or condition will improve Outcome: Progressing Goal: Knowledge of the prescribed therapeutic regimen will improve Outcome: Progressing Goal: Individualized Educational Video(s) Outcome: Progressing   Problem: Activity: Goal: Ability to tolerate increased activity will improve Outcome: Progressing Goal: Will verbalize the importance of balancing activity with adequate rest periods Outcome: Progressing   Problem: Respiratory: Goal: Ability to maintain a clear airway will improve Outcome: Progressing Goal: Levels of oxygenation will improve Outcome: Progressing Goal: Ability to maintain adequate ventilation will improve Outcome: Progressing   Problem: Education: Goal: Knowledge of General Education information will improve Description: Including pain rating scale, medication(s)/side effects and non-pharmacologic comfort measures Outcome: Progressing   Problem: Health Behavior/Discharge Planning: Goal: Ability to manage health-related needs will improve Outcome: Progressing   Problem: Clinical Measurements: Goal: Ability to maintain clinical measurements within normal limits will improve Outcome: Progressing Goal: Will remain free from infection Outcome: Progressing Goal: Diagnostic test results will improve Outcome: Progressing Goal: Respiratory complications will improve Outcome: Progressing Goal: Cardiovascular complication  will be avoided Outcome: Progressing   Problem: Activity: Goal: Risk for activity intolerance will decrease Outcome: Progressing   Problem: Nutrition: Goal: Adequate nutrition will be maintained Outcome: Progressing   Problem: Coping: Goal: Level of anxiety will decrease Outcome: Progressing   Problem: Elimination: Goal: Will not experience complications related to bowel motility Outcome: Progressing Goal: Will not experience complications related to urinary retention Outcome: Progressing   Problem: Pain Managment: Goal: General experience of comfort will improve Outcome: Progressing   Problem: Safety: Goal: Ability to remain free from injury will improve Outcome: Progressing   Problem: Skin Integrity: Goal: Risk for impaired skin integrity will decrease Outcome: Progressing   Problem: Education: Goal: Knowledge of General Education information will improve Description: Including pain rating scale, medication(s)/side effects and non-pharmacologic comfort measures Outcome: Progressing   Problem: Health Behavior/Discharge Planning: Goal: Ability to manage health-related needs will improve Outcome: Progressing   Problem: Clinical Measurements: Goal: Ability to maintain clinical measurements within normal limits will improve Outcome: Progressing Goal: Will remain free from infection Outcome: Progressing Goal: Diagnostic test results will improve Outcome: Progressing Goal: Respiratory complications will improve Outcome: Progressing Goal: Cardiovascular complication will be avoided Outcome: Progressing   Problem: Activity: Goal: Risk for activity intolerance will decrease Outcome: Progressing   Problem: Nutrition: Goal: Adequate nutrition will be maintained Outcome: Progressing   Problem: Elimination: Goal: Will not experience complications related to bowel motility Outcome: Progressing Goal: Will not experience complications related to urinary  retention Outcome: Progressing   Problem: Pain Managment: Goal: General experience of comfort will improve Outcome: Progressing   Problem: Safety: Goal: Ability to remain free from injury will improve Outcome: Progressing   Problem: Skin Integrity: Goal: Risk for impaired skin integrity will decrease Outcome: Progressing

## 2022-10-23 NOTE — Progress Notes (Signed)
Rounding Note    Patient Name: [name] Date of Encounter: 10/23/2022  [facility] Cardiologist: Freada Bergeron, MD    Subjective    88 yo admitted with worsening dyspnea.  Found to have pneumonia and acute diastolic CHF  HTN I/O are + 394 for the admission  Echo on 12/22 shows normal LV EF of 60-65% Grade II DD Moderate pulmonary HTN ( est PA pressure = 51)  Elevated RA pressure   Creatinine is 1.79 Glucose is 157        Inpatient Medications    Scheduled Meds:  amLODipine  5 mg Oral Daily   azithromycin  500 mg Oral QHS   feeding supplement  237 mL Oral BID BM   furosemide  40 mg Oral Daily   guaiFENesin  600 mg Oral BID   hydrALAZINE  50 mg Oral BID   isosorbide mononitrate  30 mg Oral Daily   multivitamin with minerals  1 tablet Oral Daily   OLANZapine  5 mg Oral QHS   pantoprazole  40 mg Oral Daily   predniSONE  40 mg Oral Q breakfast   sodium chloride flush  3 mL Intravenous Q12H   Continuous Infusions:  sodium chloride     cefTRIAXone (ROCEPHIN)  IV 2 g (10/22/22 2319)   PRN Meds: sodium chloride, acetaminophen **OR** acetaminophen, albuterol, haloperidol lactate, HYDROcodone-acetaminophen, sodium chloride flush   Vital Signs    Vitals:   10/22/22 1457 10/22/22 2053 10/23/22 0500 10/23/22 0505  BP: (!) 158/72 (!) 174/79  (!) 119/100  Pulse: (!) 56 64  (!) 59  Resp: '20 17  16  '$ Temp: 97.9 F (36.6 C) 97.6 F (36.4 C)  98.5 F (36.9 C)  TempSrc: Oral   Oral  SpO2: 100% 100%  100%  Weight:   65.3 kg   Height:        Intake/Output Summary (Last 24 hours) at 10/23/2022 0843 Last data filed at 10/22/2022 2146 Gross per 24 hour  Intake 441 ml  Output 900 ml  Net -459 ml      10/23/2022    5:00 AM 10/21/2022    5:00 AM 10/19/2022   10:24 PM  Last 3 Weights  Weight (lbs) 143 lb 15.4 oz 141 lb 15.6 oz 135 lb  Weight (kg) 65.3 kg 64.4 kg 61.236 kg      Telemetry    NSR with frequent PVCs - Personally  Reviewed  ECG     - Personally Reviewed  Physical Exam    GEN: elderly female, No acute distress.   Neck: No JVD Cardiac: RRR , soft diastolic murmur , soft systolic murmur  Respiratory: expiratory wheezes  GI: Soft, nontender, non-distended  MS: No edema; No deformity. Neuro:  Nonfocal  Psych: Normal affect   Labs    High Sensitivity Troponin:   Recent Labs  Lab 10/19/22 2051 10/19/22 2236 10/20/22 0043  TROPONINIHS 141* 140* 141*     Chemistry Recent Labs  Lab 10/19/22 2051 10/19/22 2236 10/20/22 1040 10/21/22 1043 10/22/22 0553 10/23/22 0632  NA 144  --  142 141 137 141  K 3.9  --  4.2 4.0 3.8 3.9  CL 114*  --  111 110 109 109  CO2 21*  --  19* 20* 22 23  GLUCOSE 73  --  133* 198* 183* 157*  BUN 22  --  24* 37* 48* 48*  CREATININE 1.32*  --  1.53* 1.61* 1.67* 1.79*  CALCIUM 9.2  --  9.1 9.2 9.1 9.1  MG  --  1.9 2.2  --   --   --   PROT 7.7  --  8.0  --   --   --   ALBUMIN 2.8*  --  2.8*  --   --   --   AST 20  --  20  --   --   --   ALT 14  --  13  --   --   --   ALKPHOS 64  --  57  --   --   --   BILITOT 0.7  --  0.7  --   --   --   GFRNONAA 39*  --  33* 31* 29* 27*  ANIONGAP 9  --  '12 11 6 9    '$ Lipids No results for input(s): "CHOL", "TRIG", "HDL", "LABVLDL", "LDLCALC", "CHOLHDL" in the last 168 hours.  Hematology Recent Labs  Lab 10/19/22 2051 10/20/22 1040  WBC 6.6 7.4  RBC 3.23*  3.15* 3.49*  HGB 8.5* 9.1*  HCT 28.7* 30.9*  MCV 88.9 88.5  MCH 26.3 26.1  MCHC 29.6* 29.4*  RDW 14.5 14.6  PLT 249 218   Thyroid  Recent Labs  Lab 10/19/22 2051  TSH 1.355    BNP Recent Labs  Lab 10/19/22 2051  BNP 1,976.4*    DDimer No results for input(s): "DDIMER" in the last 168 hours.   Radiology    No results found.  Cardiac Studies      Patient Profile     88 y.o. female  with pneumonia   Assessment & Plan     Pneumonia :   cont meds.  Still wheezing   2.   HFpEF:   will need to watch creatinine.  She has CKD.  She is on  Torsemide at home .  Will switch her to her home dose of Torsemide 10 mg a day      [place] will sign off.   Medication Recommendations:  [facility]  Other recommendations (labs, testing, etc):   Follow up as an outpatient:  Dr. [name]   For questions or updates, please contact [place] Please consult [email or web address] for contact info under        Signed, Mertie Moores, MD  10/23/2022, 8:43 AM

## 2022-10-23 NOTE — Plan of Care (Signed)
Patient confused  careplan progressing Problem: Education: Goal: Ability to demonstrate management of disease process will improve Outcome: Progressing Goal: Ability to verbalize understanding of medication therapies will improve Outcome: Progressing Goal: Individualized Educational Video(s) Outcome: Progressing   Problem: Activity: Goal: Capacity to carry out activities will improve Outcome: Progressing   Problem: Cardiac: Goal: Ability to achieve and maintain adequate cardiopulmonary perfusion will improve Outcome: Progressing   Problem: Education: Goal: Knowledge of disease or condition will improve Outcome: Progressing Goal: Knowledge of the prescribed therapeutic regimen will improve Outcome: Progressing Goal: Individualized Educational Video(s) Outcome: Progressing   Problem: Activity: Goal: Ability to tolerate increased activity will improve Outcome: Progressing Goal: Will verbalize the importance of balancing activity with adequate rest periods Outcome: Progressing   Problem: Respiratory: Goal: Ability to maintain a clear airway will improve Outcome: Progressing Goal: Levels of oxygenation will improve Outcome: Progressing Goal: Ability to maintain adequate ventilation will improve Outcome: Progressing   Problem: Education: Goal: Knowledge of General Education information will improve Description: Including pain rating scale, medication(s)/side effects and non-pharmacologic comfort measures Outcome: Progressing   Problem: Health Behavior/Discharge Planning: Goal: Ability to manage health-related needs will improve Outcome: Progressing   Problem: Clinical Measurements: Goal: Ability to maintain clinical measurements within normal limits will improve Outcome: Progressing Goal: Will remain free from infection Outcome: Progressing Goal: Diagnostic test results will improve Outcome: Progressing Goal: Respiratory complications will improve Outcome:  Progressing Goal: Cardiovascular complication will be avoided Outcome: Progressing   Problem: Activity: Goal: Risk for activity intolerance will decrease Outcome: Progressing   Problem: Nutrition: Goal: Adequate nutrition will be maintained Outcome: Progressing   Problem: Coping: Goal: Level of anxiety will decrease Outcome: Progressing   Problem: Elimination: Goal: Will not experience complications related to bowel motility Outcome: Progressing Goal: Will not experience complications related to urinary retention Outcome: Progressing   Problem: Pain Managment: Goal: General experience of comfort will improve Outcome: Progressing   Problem: Safety: Goal: Ability to remain free from injury will improve Outcome: Progressing   Problem: Skin Integrity: Goal: Risk for impaired skin integrity will decrease Outcome: Progressing   Problem: Education: Goal: Knowledge of General Education information will improve Description: Including pain rating scale, medication(s)/side effects and non-pharmacologic comfort measures Outcome: Progressing   Problem: Health Behavior/Discharge Planning: Goal: Ability to manage health-related needs will improve Outcome: Progressing   Problem: Clinical Measurements: Goal: Ability to maintain clinical measurements within normal limits will improve Outcome: Progressing Goal: Will remain free from infection Outcome: Progressing Goal: Diagnostic test results will improve Outcome: Progressing Goal: Respiratory complications will improve Outcome: Progressing Goal: Cardiovascular complication will be avoided Outcome: Progressing   Problem: Activity: Goal: Risk for activity intolerance will decrease Outcome: Progressing   Problem: Nutrition: Goal: Adequate nutrition will be maintained Outcome: Progressing   Problem: Elimination: Goal: Will not experience complications related to bowel motility Outcome: Progressing Goal: Will not experience  complications related to urinary retention Outcome: Progressing   Problem: Pain Managment: Goal: General experience of comfort will improve Outcome: Progressing   Problem: Safety: Goal: Ability to remain free from injury will improve Outcome: Progressing   Problem: Skin Integrity: Goal: Risk for impaired skin integrity will decrease Outcome: Progressing

## 2022-10-23 NOTE — Progress Notes (Signed)
PROGRESS NOTE    [name]  LSL:[id number] DOB: 11/30/1931 DOA: 10/19/2022 PCP: Lauree Chandler, NP     Brief Narrative:  [name] is an 88 year old female with past medical history significant for dementia, GERD, HTN, CKD, who present to the hospital with confusion. Per family, they reported that patient was not at her baseline and not getting up as normal.  In the emergency department, she was found to have wheezing.  She was started on steroids as well as empiric antibiotics.  She had elevated BNP and was also ordered for Lasix and cardiology consulted for CHF exacerbation.  Patient lives at home alone, and between caregivers, probably has around 14 to 15 hours of assistance at home. Family is requesting patient to be placed in a facility due to her safety concerns and does not have 24-hour care.   New events last 24 hours / Subjective: Remains confused in setting of dementia. Nods "yes" to every question asked.   Assessment & Plan:   Principal Problem:   CAP (community acquired pneumonia) Active Problems:   Essential hypertension   MGUS (monoclonal gammopathy of unknown significance)   CKD (chronic kidney disease) stage 3, GFR 30-59 ml/min (HCC)   COPD with acute exacerbation (HCC)   Acute on chronic combined systolic (congestive) and diastolic (congestive) heart failure (HCC)   Dementia with behavioral disturbance (HCC)   Normocytic anemia   Acute metabolic encephalopathy   Elevated troponin   Acute metabolic encephalopathy -Insetting of underlying dementia, pneumonia, heart failure -Zyprexa  -Seems to be her baseline now  CAP -COVID, influenza, RVP negative -Strep pneumo antigen negative -Blood culture 1 bottle showing staph epidermidis - contaminant  -Rocephin, azithromycin -Procalcitonin trending downward  COPD exacerbation -Prednisone, breathing tx   Acute on chronic combined systolic and diastolic heart failure -Cardiology consulted, signed  off 12/25  -Echocardiogram showing grade 2 diastolic dysfunction -Resume home torsemide   AKI on CKD stage IIIb -Baseline creatinine 1.3 -Slightly trending upward in setting of lasix use -Will give ~500cc IVF today, resume torsemide tomorrow   Hypertension -Hydralazine, norvasc   Demand ischemia -Insetting of pneumonia, COPD, CHF  TB test -Requested by family member -Ordered 12/22    DVT prophylaxis:  SCDs Start: 10/20/22 0819  Code Status: Full Family Communication: None at bedside Disposition Plan:  Status is: Inpatient Remains inpatient appropriate because: SNF placement   Antimicrobials:  Anti-infectives (From admission, onward)    Start     Dose/Rate Route Frequency Ordered Stop   10/21/22 2200  azithromycin (ZITHROMAX) tablet 500 mg        500 mg Oral Daily at bedtime 10/21/22 1101 10/24/22 2159   10/20/22 2300  cefTRIAXone (ROCEPHIN) 2 g in sodium chloride 0.9 % 100 mL IVPB        2 g 200 mL/hr over 30 Minutes Intravenous Every 24 hours 10/19/22 2338 10/25/22 2259   10/20/22 2300  azithromycin (ZITHROMAX) 500 mg in sodium chloride 0.9 % 250 mL IVPB  Status:  Discontinued        500 mg 250 mL/hr over 60 Minutes Intravenous Every 24 hours 10/19/22 2338 10/21/22 1101   10/19/22 2300  cefTRIAXone (ROCEPHIN) 1 g in sodium chloride 0.9 % 100 mL IVPB        1 g 200 mL/hr over 30 Minutes Intravenous  Once 10/19/22 2255 10/20/22 0048   10/19/22 2300  azithromycin (ZITHROMAX) 500 mg in sodium chloride 0.9 % 250 mL IVPB  500 mg 250 mL/hr over 60 Minutes Intravenous  Once 10/19/22 2255 10/20/22 0546        Objective: Vitals:   10/22/22 1457 10/22/22 2053 10/23/22 0500 10/23/22 0505  BP: (!) 158/72 (!) 174/79  (!) 119/100  Pulse: (!) 56 64  (!) 59  Resp: '20 17  16  '$ Temp: 97.9 F (36.6 C) 97.6 F (36.4 C)  98.5 F (36.9 C)  TempSrc: Oral   Oral  SpO2: 100% 100%  100%  Weight:   65.3 kg   Height:        Intake/Output Summary (Last 24 hours) at  10/23/2022 1214 Last data filed at 10/23/2022 1039 Gross per 24 hour  Intake 440 ml  Output 600 ml  Net -160 ml   Filed Weights   10/19/22 2224 10/21/22 0500 10/23/22 0500  Weight: 61.2 kg 64.4 kg 65.3 kg    Examination:  General exam: Appears calm and comfortable  Respiratory system: CTAB anteriorly, on room air without distress  Cardiovascular system: S1 & S2 heard, RRR. No murmurs. + pedal edema. Gastrointestinal system: Abdomen is nondistended, soft and nontender. Normal bowel sounds heard. Extremities: Symmetric in appearance  Skin: +chronic venous stasis   Data Reviewed: I have personally reviewed following labs and imaging studies  CBC: Recent Labs  Lab 10/19/22 2051 10/20/22 1040  WBC 6.6 7.4  NEUTROABS 4.8  --   HGB 8.5* 9.1*  HCT 28.7* 30.9*  MCV 88.9 88.5  PLT 249 147   Basic Metabolic Panel: Recent Labs  Lab 10/19/22 2051 10/19/22 2236 10/20/22 1040 10/21/22 1043 10/22/22 0553 10/23/22 0632  NA 144  --  142 141 137 141  K 3.9  --  4.2 4.0 3.8 3.9  CL 114*  --  111 110 109 109  CO2 21*  --  19* 20* 22 23  GLUCOSE 73  --  133* 198* 183* 157*  BUN 22  --  24* 37* 48* 48*  CREATININE 1.32*  --  1.53* 1.61* 1.67* 1.79*  CALCIUM 9.2  --  9.1 9.2 9.1 9.1  MG  --  1.9 2.2  --   --   --   PHOS  --  3.6 5.1*  --   --   --    GFR: Estimated Creatinine Clearance: 17.8 mL/min (A) (by C-G formula based on SCr of 1.79 mg/dL (H)). Liver Function Tests: Recent Labs  Lab 10/19/22 2051 10/20/22 1040  AST 20 20  ALT 14 13  ALKPHOS 64 57  BILITOT 0.7 0.7  PROT 7.7 8.0  ALBUMIN 2.8* 2.8*   No results for input(s): "LIPASE", "AMYLASE" in the last 168 hours. Recent Labs  Lab 10/20/22 0043  AMMONIA 15   Coagulation Profile: No results for input(s): "INR", "PROTIME" in the last 168 hours. Cardiac Enzymes: Recent Labs  Lab 10/19/22 2236  CKTOTAL 250*   BNP (last 3 results) No results for input(s): "PROBNP" in the last 8760 hours. HbA1C: No results  for input(s): "HGBA1C" in the last 72 hours. CBG: No results for input(s): "GLUCAP" in the last 168 hours. Lipid Profile: No results for input(s): "CHOL", "HDL", "LDLCALC", "TRIG", "CHOLHDL", "LDLDIRECT" in the last 72 hours. Thyroid Function Tests: No results for input(s): "TSH", "T4TOTAL", "FREET4", "T3FREE", "THYROIDAB" in the last 72 hours.  Anemia Panel: No results for input(s): "VITAMINB12", "FOLATE", "FERRITIN", "TIBC", "IRON", "RETICCTPCT" in the last 72 hours.  Sepsis Labs: Recent Labs  Lab 10/19/22 2051 10/19/22 2217 10/19/22 2236 10/20/22 1040 10/22/22 0553  PROCALCITON  --   --  2.43 1.29 0.70  LATICACIDVEN 1.5 1.5  --   --   --     Recent Results (from the past 240 hour(s))  Resp panel by RT-PCR (RSV, Flu A&B, Covid) Anterior Nasal Swab     Status: None   Collection Time: 10/19/22  8:51 PM   Specimen: Anterior Nasal Swab  Result Value Ref Range Status   SARS Coronavirus 2 by RT PCR NEGATIVE NEGATIVE Final    Comment: (NOTE) SARS-CoV-2 target nucleic acids are NOT DETECTED.  The SARS-CoV-2 RNA is generally detectable in upper respiratory specimens during the acute phase of infection. The lowest concentration of SARS-CoV-2 viral copies this assay can detect is 138 copies/mL. A negative result does not preclude SARS-Cov-2 infection and should not be used as the sole basis for treatment or other patient management decisions. A negative result may occur with  improper specimen collection/handling, submission of specimen other than nasopharyngeal swab, presence of viral mutation(s) within the areas targeted by this assay, and inadequate number of viral copies(<138 copies/mL). A negative result must be combined with clinical observations, patient history, and epidemiological information. The expected result is Negative.  Fact Sheet for Patients:  EntrepreneurPulse.com.au  Fact Sheet for Healthcare Providers:   IncredibleEmployment.be  This test is no t yet approved or cleared by the [place] FDA and  has been authorized for detection and/or diagnosis of SARS-CoV-2 by FDA under an Emergency Use Authorization (EUA). This EUA will remain  in effect (meaning this test can be used) for the duration of the COVID-19 declaration under Section 564(b)(1) of the Act, 21 U.S.C.section 360bbb-3(b)(1), unless the authorization is terminated  or revoked sooner.       Influenza A by PCR NEGATIVE NEGATIVE Final   Influenza B by PCR NEGATIVE NEGATIVE Final    Comment: (NOTE) The Xpert Xpress SARS-CoV-2/FLU/RSV plus assay is intended as an aid in the diagnosis of influenza from Nasopharyngeal swab specimens and should not be used as a sole basis for treatment. Nasal washings and aspirates are unacceptable for Xpert Xpress SARS-CoV-2/FLU/RSV testing.  Fact Sheet for Patients: EntrepreneurPulse.com.au  Fact Sheet for Healthcare Providers: IncredibleEmployment.be  This test is not yet approved or cleared by the [place] FDA and has been authorized for detection and/or diagnosis of SARS-CoV-2 by FDA under an Emergency Use Authorization (EUA). This EUA will remain in effect (meaning this test can be used) for the duration of the COVID-19 declaration under Section 564(b)(1) of the Act, 21 U.S.C. section 360bbb-3(b)(1), unless the authorization is terminated or revoked.     Resp Syncytial Virus by PCR NEGATIVE NEGATIVE Final    Comment: (NOTE) Fact Sheet for Patients: EntrepreneurPulse.com.au  Fact Sheet for Healthcare Providers: IncredibleEmployment.be  This test is not yet approved or cleared by the [place] FDA and has been authorized for detection and/or diagnosis of SARS-CoV-2 by FDA under an Emergency Use Authorization (EUA). This EUA will remain in effect (meaning this test can be used) for  the duration of the COVID-19 declaration under Section 564(b)(1) of the Act, 21 U.S.C. section 360bbb-3(b)(1), unless the authorization is terminated or revoked.  Performed at [facility], [place] [address], [place], [place] [address]   Blood culture (routine x 2)     Status: None (Preliminary result)   Collection Time: 10/19/22 10:15 PM   Specimen: BLOOD  Result Value Ref Range Status   Specimen Description   Final    BLOOD LEFT ANTECUBITAL Performed at [facility]  [place], [place] [address]    Special Requests   Final    BOTTLES DRAWN AEROBIC AND ANAEROBIC Blood Culture results may not be optimal due to an inadequate volume of blood received in culture bottles Performed at [facility] [address] [place], [place] [address]    Culture   Final    NO GROWTH 3 DAYS Performed at [facility], [place] [address], [place], [place] [address]    Report Status PENDING  Incomplete  Blood culture (routine x 2)     Status: Abnormal   Collection Time: 10/19/22 10:17 PM   Specimen: BLOOD  Result Value Ref Range Status   Specimen Description   Final    BLOOD RIGHT ANTECUBITAL Performed at [facility] [address], [place], [place] [address]    Special Requests   Final    BOTTLES DRAWN AEROBIC AND ANAEROBIC Blood Culture adequate volume Performed at [facility] [address] [place], [place] [address]    Culture  Setup Time   Final    GRAM POSITIVE COCCI AEROBIC BOTTLE ONLY CRITICAL RESULT CALLED TO, READ BACK BY AND VERIFIED WITH: L POINDEXTER,PHARMD'@0419'$  10/21/22 [place]    Culture (A)  Final    STAPHYLOCOCCUS EPIDERMIDIS THE SIGNIFICANCE OF ISOLATING THIS ORGANISM FROM A SINGLE SET OF BLOOD CULTURES WHEN MULTIPLE SETS ARE DRAWN IS UNCERTAIN. PLEASE NOTIFY THE MICROBIOLOGY DEPARTMENT WITHIN ONE WEEK IF SPECIATION AND SENSITIVITIES ARE REQUIRED. Performed at [facility], [place] [address], [place], [place] [address]    Report Status 10/23/2022 FINAL  Final  Blood Culture ID Panel (Reflexed)     Status: Abnormal   Collection Time: 10/19/22 10:17 PM  Result Value Ref Range Status   Enterococcus faecalis NOT DETECTED NOT DETECTED Final   Enterococcus Faecium NOT DETECTED NOT DETECTED Final   Listeria monocytogenes NOT DETECTED NOT DETECTED Final   Staphylococcus species DETECTED (A) NOT DETECTED Final    Comment: CRITICAL RESULT CALLED TO, READ BACK BY AND VERIFIED WITH: L POINDEXTER,PHARMD'@0420'$  10/21/22 [place]    Staphylococcus aureus (BCID) NOT DETECTED NOT DETECTED Final   Staphylococcus epidermidis DETECTED (A) NOT DETECTED Final    Comment: CRITICAL RESULT CALLED TO, READ BACK BY AND VERIFIED WITH: L POINDEXTER,PHARMD'@0420'$  10/21/22 [place]    Staphylococcus lugdunensis NOT DETECTED NOT DETECTED Final   Streptococcus species NOT DETECTED NOT DETECTED Final   Streptococcus agalactiae NOT DETECTED NOT DETECTED Final   Streptococcus pneumoniae NOT DETECTED NOT DETECTED Final   Streptococcus pyogenes NOT DETECTED NOT DETECTED Final   A.calcoaceticus-baumannii NOT DETECTED NOT DETECTED Final   Bacteroides fragilis NOT DETECTED NOT DETECTED Final   Enterobacterales NOT DETECTED NOT DETECTED Final   Enterobacter cloacae complex NOT DETECTED NOT DETECTED Final   Escherichia coli NOT DETECTED NOT DETECTED Final   Klebsiella aerogenes NOT DETECTED NOT DETECTED Final   Klebsiella oxytoca NOT DETECTED NOT DETECTED Final   Klebsiella pneumoniae NOT DETECTED NOT DETECTED Final   Proteus species NOT DETECTED NOT DETECTED Final   Salmonella species NOT DETECTED NOT DETECTED Final   Serratia marcescens NOT DETECTED NOT DETECTED Final   Haemophilus influenzae NOT DETECTED NOT DETECTED Final   Neisseria meningitidis NOT DETECTED NOT DETECTED Final   Pseudomonas aeruginosa NOT DETECTED NOT DETECTED Final   Stenotrophomonas maltophilia NOT DETECTED NOT DETECTED  Final   Candida albicans NOT DETECTED NOT DETECTED Final   Candida auris NOT DETECTED NOT DETECTED Final   Candida glabrata NOT DETECTED NOT DETECTED  Final   Candida krusei NOT DETECTED NOT DETECTED Final   Candida parapsilosis NOT DETECTED NOT DETECTED Final   Candida tropicalis NOT DETECTED NOT DETECTED Final   Cryptococcus neoformans/gattii NOT DETECTED NOT DETECTED Final   Methicillin resistance mecA/C NOT DETECTED NOT DETECTED Final    Comment: Performed at [facility], [place] [address], [place], [place] [address]  Expectorated Sputum Assessment w Gram Stain, Rflx to Resp Cult     Status: None   Collection Time: 10/19/22 10:36 PM   Specimen: Expectorated Sputum  Result Value Ref Range Status   Specimen Description EXPECTORATED SPUTUM  Final   Special Requests NONE  Final   Sputum evaluation   Final    THIS SPECIMEN IS ACCEPTABLE FOR SPUTUM CULTURE Performed at [facility], [place] [address], [place], [place] [address]    Report Status 10/19/2022 FINAL  Final  Culture, Respiratory w Gram Stain     Status: None   Collection Time: 10/19/22 10:36 PM  Result Value Ref Range Status   Specimen Description   Final    EXPECTORATED SPUTUM Performed at [facility], [place] [address] [place], [place] [address]    Special Requests   Final    NONE Reflexed from [id number] Performed at [facility], [place] [address] [place], [place] [address]    Gram Stain   Final    FEW SQUAMOUS EPITHELIAL CELLS PRESENT MODERATE WBC PRESENT,BOTH PMN AND MONONUCLEAR MODERATE GRAM NEGATIVE RODS MODERATE GRAM POSITIVE COCCI IN PAIRS    Culture   Final    ABUNDANT Consistent with normal respiratory flora. No Pseudomonas species isolated Performed at [facility] [address], [place], [place] [address]    Report Status 10/22/2022 FINAL  Final  Respiratory (~20 pathogens) panel by PCR     Status: None   Collection Time: 10/20/22 12:43 AM    Specimen: Nasopharyngeal Swab; Respiratory  Result Value Ref Range Status   Adenovirus NOT DETECTED NOT DETECTED Final   Coronavirus 229E NOT DETECTED NOT DETECTED Final    Comment: (NOTE) The Coronavirus on the Respiratory Panel, DOES NOT test for the novel  Coronavirus (2019 nCoV)    Coronavirus HKU1 NOT DETECTED NOT DETECTED Final   Coronavirus NL63 NOT DETECTED NOT DETECTED Final   Coronavirus OC43 NOT DETECTED NOT DETECTED Final   Metapneumovirus NOT DETECTED NOT DETECTED Final   Rhinovirus / Enterovirus NOT DETECTED NOT DETECTED Final   Influenza A NOT DETECTED NOT DETECTED Final   Influenza B NOT DETECTED NOT DETECTED Final   Parainfluenza Virus 1 NOT DETECTED NOT DETECTED Final   Parainfluenza Virus 2 NOT DETECTED NOT DETECTED Final   Parainfluenza Virus 3 NOT DETECTED NOT DETECTED Final   Parainfluenza Virus 4 NOT DETECTED NOT DETECTED Final   Respiratory Syncytial Virus NOT DETECTED NOT DETECTED Final   Bordetella pertussis NOT DETECTED NOT DETECTED Final   Bordetella Parapertussis NOT DETECTED NOT DETECTED Final   Chlamydophila pneumoniae NOT DETECTED NOT DETECTED Final   Mycoplasma pneumoniae NOT DETECTED NOT DETECTED Final    Comment: Performed at [facility], [place]. [address] [place], [place] [address]      Radiology Studies: No results found.    Scheduled Meds:  amLODipine  5 mg Oral Daily   azithromycin  500 mg Oral QHS   feeding supplement  237 mL Oral BID BM   guaiFENesin  600 mg Oral BID   hydrALAZINE  50 mg Oral BID  isosorbide mononitrate  30 mg Oral Daily   multivitamin with minerals  1 tablet Oral Daily   OLANZapine  5 mg Oral QHS   pantoprazole  40 mg Oral Daily   predniSONE  40 mg Oral Q breakfast   sodium chloride flush  3 mL Intravenous Q12H   [START ON 10/24/2022] torsemide  10 mg Oral Daily   Continuous Infusions:  sodium chloride     sodium chloride     cefTRIAXone (ROCEPHIN)  IV 2 g (10/22/22 2319)     LOS: 4 days   Time  spent: 25 minutes   Dessa Phi, DO [facility] 10/23/2022, 12:14 PM   Available via Epic secure chat 7am-7pm After these hours, please refer to coverage provider listed on [email or web address]

## 2022-10-24 DIAGNOSIS — J189 Pneumonia, unspecified organism: Secondary | ICD-10-CM | POA: Diagnosis not present

## 2022-10-24 LAB — BASIC METABOLIC PANEL
Anion gap: 10 (ref 5–15)
BUN: 41 mg/dL — ABNORMAL HIGH (ref 8–23)
CO2: 23 mmol/L (ref 22–32)
Calcium: 9.4 mg/dL (ref 8.9–10.3)
Chloride: 108 mmol/L (ref 98–111)
Creatinine, Ser: 1.43 mg/dL — ABNORMAL HIGH (ref 0.44–1.00)
GFR, Estimated: 35 mL/min — ABNORMAL LOW (ref >60)
Glucose, Bld: 187 mg/dL — ABNORMAL HIGH (ref 70–99)
Potassium: 3.8 mmol/L (ref 3.5–5.1)
Sodium: 141 mmol/L (ref 135–145)

## 2022-10-24 MED ORDER — AMLODIPINE BESYLATE 10 MG PO TABS
10.0000 mg | ORAL_TABLET | Freq: Every day | ORAL | Status: DC
  Administered 2022-10-25: 10 mg via ORAL
  Filled 2022-10-24: qty 1

## 2022-10-24 NOTE — NC FL2 (Signed)
[place] LEVEL OF CARE FORM     IDENTIFICATION  Patient Name: [name] Birthdate: February 11, 1932 Sex: female Admission Date (Current Location): 10/19/2022  [facility] [place] Number:  Herbalist and Address:  [facility],  [place], [place]      Provider Number: [id number]  Attending Physician Name and Address:  Dessa Phi, DO  Relative Name and Phone Number:  Nephew Alan [place] [phone]    Current Level of Care: Hospital Recommended Level of Care: [place] Prior Approval Number:    Date Approved/Denied:   PASRR Number: [id number]  Discharge Plan: SNF    Current Diagnoses: Patient Active Problem List   Diagnosis Date Noted   CAP (community acquired pneumonia) 10/19/2022   COPD with acute exacerbation ([place]) 10/19/2022   Acute on chronic combined systolic (congestive) and diastolic (congestive) heart failure ([place]) 10/19/2022   Dementia with behavioral disturbance ([place]) 10/19/2022   Normocytic anemia 84/13/2440   Acute metabolic encephalopathy 08/26/2535   Elevated troponin 10/19/2022   PVD (peripheral vascular disease) ([place]) 04/27/2021   Medication noncompliance due to cognitive impairment 10/17/2019   Chronic pain of multiple joints 08/01/2017   Age-related osteoporosis without current pathological fracture 08/01/2017   Left knee pain 09/19/2016   Rectal benign neoplasm    Asthma, chronic 04/04/2015   history of Tubular adenoma of colon 04/04/2015   CKD (chronic kidney disease) stage 3, GFR 30-59 ml/min (HCC) 04/03/2015   Edema 64/40/3474   Chronic systolic heart failure ([place]) 11/20/2013   Lymphedema of lower extremity 10/17/2013   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[place]) 04/04/2013   MGUS (monoclonal gammopathy of unknown significance) 05/15/2012   Vitamin D deficiency 01/29/2010   DERMATITIS 09/30/2009   Paranoid delusion ([place]) 08/02/2008   Obstructive chronic bronchitis ([place]) 08/02/2008    Essential hypertension 11/11/2007   Venous (peripheral) insufficiency 11/11/2007   RENAL CALCULUS 11/11/2007   LOW BACK PAIN, CHRONIC 11/11/2007    Orientation RESPIRATION BLADDER Height & Weight     Self  Normal Incontinent, External catheter Weight: 144 lb 6.4 oz (65.5 kg) Height:  '4\' 11"'$  (149.9 cm)  BEHAVIORAL SYMPTOMS/MOOD NEUROLOGICAL BOWEL NUTRITION STATUS      Incontinent Diet (regular)  AMBULATORY STATUS COMMUNICATION OF NEEDS Skin   Limited Assist Verbally Other (Comment), Skin abrasions (Skin tear right buttocks)                       Personal Care Assistance Level of Assistance  Bathing, Feeding, Dressing Bathing Assistance: Maximum assistance Feeding assistance: Limited assistance Dressing Assistance: Maximum assistance     Functional Limitations Info  Sight, Hearing, Speech Sight Info: Impaired Hearing Info: Adequate Speech Info: Adequate    SPECIAL CARE FACTORS FREQUENCY  PT (By licensed PT), OT (By licensed OT)     PT Frequency: 5x/wk OT Frequency: 5x/wk            Contractures Contractures Info: Not present    Additional Factors Info  Code Status, Allergies, Psychotropic Code Status Info: FULL Allergies Info: Aspirin, Codeine, Latex Psychotropic Info: See MAR         Current Medications (10/24/2022):  This is the current hospital active medication list Current Facility-Administered Medications  Medication Dose Route Frequency Provider Last Rate Last Admin   0.9 %  sodium chloride infusion  250 mL Intravenous PRN Doutova, Anastassia, MD       acetaminophen (TYLENOL) tablet 650 mg  650 mg Oral Q6H PRN Toy Baker, MD  Or   acetaminophen (TYLENOL) suppository 650 mg  650 mg Rectal Q6H PRN Toy Baker, MD       amLODipine (NORVASC) tablet 5 mg  5 mg Oral Daily Dessa Phi, DO   5 mg at 10/24/22 4403   cefTRIAXone (ROCEPHIN) 2 g in sodium chloride 0.9 % 100 mL IVPB  2 g Intravenous Q24H Doutova, Anastassia, MD 200  mL/hr at 10/23/22 2220 2 g at 10/23/22 2220   feeding supplement (ENSURE ENLIVE / ENSURE PLUS) liquid 237 mL  237 mL Oral BID BM Dessa Phi, DO   237 mL at 10/23/22 1839   guaiFENesin (MUCINEX) 12 hr tablet 600 mg  600 mg Oral BID Toy Baker, MD   600 mg at 10/24/22 4742   haloperidol lactate (HALDOL) injection 2 mg  2 mg Intravenous Q6H PRN Dessa Phi, DO       hydrALAZINE (APRESOLINE) tablet 50 mg  50 mg Oral BID Toy Baker, MD   50 mg at 10/24/22 5956   HYDROcodone-acetaminophen (NORCO/VICODIN) 5-325 MG per tablet 1-2 tablet  1-2 tablet Oral Q4H PRN Toy Baker, MD       ipratropium-albuterol (DUONEB) 0.5-2.5 (3) MG/3ML nebulizer solution 3 mL  3 mL Nebulization Q4H PRN Dessa Phi, DO       isosorbide mononitrate (IMDUR) 24 hr tablet 30 mg  30 mg Oral Daily Freada [place], MD   30 mg at 10/24/22 3875   multivitamin with minerals tablet 1 tablet  1 tablet Oral Daily Dessa Phi, DO   1 tablet at 10/24/22 0952   OLANZapine (ZYPREXA) tablet 5 mg  5 mg Oral QHS Doutova, Anastassia, MD   5 mg at 10/23/22 2216   pantoprazole (PROTONIX) EC tablet 40 mg  40 mg Oral Daily Doutova, Anastassia, MD   40 mg at 10/24/22 0953   sodium chloride flush (NS) 0.9 % injection 3 mL  3 mL Intravenous Q12H Doutova, Anastassia, MD   3 mL at 10/23/22 0902   sodium chloride flush (NS) 0.9 % injection 3 mL  3 mL Intravenous PRN Toy Baker, MD       torsemide (DEMADEX) tablet 10 mg  10 mg Oral Daily Dessa Phi, DO   10 mg at 10/24/22 6433     Discharge Medications: Please see discharge summary for a list of discharge medications.  Relevant Imaging Results:  Relevant Lab Results:   Additional Information SSN: [id number]  Vassie Moselle, LCSW

## 2022-10-24 NOTE — Progress Notes (Signed)
Occupational Therapy Treatment Patient Details Name: [name] MRN: [id number] DOB: [birth year] Today's Date: 10/24/2022   History of present illness Patient is an 88 y/o female admitted with AMS, weakness and found to have elevated BNP and wheezing.  Admitted for CHF exacerbation and CAP.  PMH significant for dementia, GERD, HTN, CKD, COPD.   OT comments  Patient performed grooming, as well as performed upper body dressing seated EOB. She was unable to stand from EOB twice, before achieving on the third attempt. She further needed mod assist & use of a RW to take a few lateral steps towards the [facility]. She was assisted back to bed at the end of the session. She was noted to be with memory impairment & she was not oriented to place, time, or situation. She occasionally needed increased time for progressive activity, given deconditioning and generalized weakness. Continue OT plan of care.    Recommendations for follow up therapy are one component of a multi-disciplinary discharge planning process, led by the attending physician.  Recommendations may be updated based on patient status, additional functional criteria and insurance authorization.    Follow Up Recommendations  Skilled nursing-short term rehab (<3 hours/day)     Assistance Recommended at Discharge Frequent or constant Supervision/Assistance  Patient can return home with the following  A lot of help with bathing/dressing/bathroom;Direct supervision/assist for medications management;Direct supervision/assist for financial management;Assistance with cooking/housework;Assist for transportation;A lot of help with walking and/or transfers   Equipment Recommendations  Other (comment) (to be determined pending progress at next setting)       Precautions / Restrictions Precautions Precautions: Fall Restrictions Weight Bearing Restrictions: No       Mobility Bed Mobility Overal bed mobility: Needs Assistance Bed Mobility:  Supine to Sit, Sit to Supine, Rolling     Supine to sit: Mod assist, HOB elevated Sit to supine: Mod assist Rolling: Mod assist   General bed mobility comments: required increased time anf effort, as well as general cues for sequencing/transfer technique    Transfers Overall transfer level: Needs assistance Equipment used: Rolling walker (2 wheels) Transfers: Sit to/from Stand Sit to Stand: Mod assist           General transfer comment:  (She was unable to stand on first two attempts, until achieving stand on 3rd attempt; required RW for support, cues for hand placement, and cues for trunk extension in standing. Instructed on lateral stepping to [facility] using RW, requiring mod assist and cues)         ADL either performed or assessed with clinical judgement   ADL Overall ADL's : Needs assistance/impaired Eating/Feeding: Bed level;Set up Eating/Feeding Details (indicate cue type and reason): she was observed drinking water from a cup in bed Grooming: Minimal assistance;Wash/dry face;Wash/dry hands Grooming Details (indicate cue type and reason): She performed face and hand washing seated EOB.         Upper Body Dressing : Moderate assistance Upper Body Dressing Details (indicate cue type and reason): She required assist to doff then donn a clean gown seated EOB. She needed intermittent cues for sequencing and sustained attention to task. Lower Body Dressing: Total assistance Lower Body Dressing Details (indicate cue type and reason): for sock management seated EOB                               Cognition Arousal/Alertness: Awake/alert   Overall Cognitive Status: History of cognitive impairments - at baseline  Area of Impairment: Memory, Orientation        General Comments: Oriented to person, disoriented to place, time, and situation, able to follow 1 step commands with occasional repetition, difficulty dividing attention                   Pertinent  Vitals/ Pain       Pain Assessment Pain Location: She reported having unspecified generalized soreness and pain. Pain Intervention(s): Limited activity within patient's tolerance, Repositioned         Frequency  Min 2X/week        Progress Toward Goals  OT Goals(current goals can now be found in the care plan section)  Progress towards OT goals: Progressing toward goals  Acute Rehab OT Goals Patient Stated Goal: to go home soon OT Goal Formulation: With patient Time For Goal Achievement: 11/04/22 Potential to Achieve Goals: [place] Discharge plan remains appropriate       AM-PAC OT "6 Clicks" Daily Activity     Outcome Measure     Help from another person taking care of personal grooming?: A Little Help from another person toileting, which includes using toliet, bedpan, or urinal?: A Lot Help from another person bathing (including washing, rinsing, drying)?: A Lot Help from another person to put on and taking off regular upper body clothing?: A Lot Help from another person to put on and taking off regular lower body clothing?: A Lot 6 Click Score: 11    End of Session Equipment Utilized During Treatment: Gait belt;Rolling walker (2 wheels)  OT Visit Diagnosis: Unsteadiness on feet (R26.81);Muscle weakness (generalized) (M62.81)   Activity Tolerance Other (comment) (Fair overall tolerance)   Patient Left in bed;with call bell/phone within reach;with bed alarm set   Nurse Communication  Mobility Status        Time: 1884-1660 OT Time Calculation (min): 37 min  Charges: OT General Charges $OT Visit: 1 Visit OT Treatments $Self Care/Home Management : 8-22 mins $Therapeutic Activity: 8-22 mins     Leota Sauers, OTR/L 10/24/2022, 11:31 AM

## 2022-10-24 NOTE — TOC Initial Note (Signed)
Transition of Care [facility]) - Initial/Assessment Note    Patient Details  Name: [name] MRN: [id number] Date of Birth: 04-24-1932  Transition of Care [facility] CM/SW Contact:    Vassie Moselle, LCSW Phone Number: 10/24/2022, 12:31 PM  Clinical Narrative:                 CSW spoke with pt's nephew via t/c. Nephew is agreeable to SNF placement for this pt and is interested in [facility] SN[facility] as pt has been to this facility in the past and it is close to home. Pt's nephew has started the process into looking for LTC placement for pt but, feels pt needs a higher level of care than ALF. CSW encouraged pt's nephew to begin calling SNF facilities to discuss LTC availability.  Referrals have been sent out for SNF placement and currently awaiting bed offers.   Expected Discharge Plan: Skilled Nursing Facility Barriers to Discharge: SNF Pending bed offer   Patient Goals and CMS Choice Patient states their goals for this hospitalization and ongoing recovery are:: For pt to go to SNF CMS Medicare.gov Compare Post Acute Care list provided to:: Patient Represenative (must comment) Choice offered to / list presented to : [place] / [place] ownership interest in [facility].provided to:: [facility] POA / Guardian    Expected Discharge Plan and Services In-house Referral: Clinical Social Work Discharge Planning Services: CM Consult Post Acute Care Choice: [place] Living arrangements for the past 2 months: Single Family Home                 DME Arranged: N/A DME Agency: NA                  Prior Living Arrangements/Services Living arrangements for the past 2 months: Single Family Home Lives with:: Relatives Patient language and need for interpreter reviewed:: Yes Do you feel safe going back to the place where you live?: Yes      Need for Family Participation in Patient Care: Yes (Comment) Care giver support system in place?: Yes (comment) Current home  services: DME, Other (comment) (PCS) Criminal Activity/Legal Involvement Pertinent to Current Situation/Hospitalization: No - Comment as needed  Activities of Daily Living Home Assistive Devices/Equipment: Cane (specify quad or straight), Shower chair without back ADL Screening (condition at time of admission) Patient's cognitive ability adequate to safely complete daily activities?: No Is the patient deaf or have difficulty hearing?: Yes Does the patient have difficulty seeing, even when wearing glasses/contacts?: Yes Does the patient have difficulty concentrating, remembering, or making decisions?: Yes Patient able to express need for assistance with ADLs?: Yes Does the patient have difficulty dressing or bathing?: Yes Independently performs ADLs?: No Dressing (OT): Needs assistance Grooming: Needs assistance Is this a change from baseline?: Change from baseline, expected to last >3 days Feeding: Needs assistance Is this a change from baseline?: Change from baseline, expected to last >3 days Bathing: Needs assistance Is this a change from baseline?: Change from baseline, expected to last >3 days Toileting: Needs assistance Is this a change from baseline?: Change from baseline, expected to last >3days In/Out Bed: Needs assistance Is this a change from baseline?: Change from baseline, expected to last >3 days Walks in Home: Needs assistance Is this a change from baseline?: Change from baseline, expected to last >3 days Does the patient have difficulty walking or climbing stairs?: Yes Weakness of Legs: Both Weakness of Arms/Hands: Both  Permission Sought/Granted Permission sought to  share information with : Facility Art therapist granted to share information with : Yes, Verbal Permission Granted  Share Information with NAME: Alan [place]     Permission granted to share info w Relationship: Nephew  Permission granted to share info w Contact Information:  [phone]  Emotional Assessment Appearance:: Appears stated age Attitude/Demeanor/Rapport: Unable to Assess Affect (typically observed): Unable to Assess Orientation: : Oriented to Self Alcohol / Substance Use: Not Applicable Psych Involvement: No (comment)  Admission diagnosis:  Shortness of breath [R06.02] Wheezing [R06.2] CAP (community acquired pneumonia) [J18.9] Pneumonia due to infectious organism, unspecified laterality, unspecified part of lung [J18.9] Patient Active Problem List   Diagnosis Date Noted   CAP (community acquired pneumonia) 10/19/2022   COPD with acute exacerbation ([place]) 10/19/2022   Acute on chronic combined systolic (congestive) and diastolic (congestive) heart failure ([place]) 10/19/2022   Dementia with behavioral disturbance ([place]) 10/19/2022   Normocytic anemia 42/59/5638   Acute metabolic encephalopathy 75/64/3329   Elevated troponin 10/19/2022   PVD (peripheral vascular disease) ([place]) 04/27/2021   Medication noncompliance due to cognitive impairment 10/17/2019   Chronic pain of multiple joints 08/01/2017   Age-related osteoporosis without current pathological fracture 08/01/2017   Left knee pain 09/19/2016   Rectal benign neoplasm    Asthma, chronic 04/04/2015   history of Tubular adenoma of colon 04/04/2015   CKD (chronic kidney disease) stage 3, GFR 30-59 ml/min (HCC) 04/03/2015   Edema 51/88/4166   Chronic systolic heart failure ([place]) 11/20/2013   Lymphedema of lower extremity 10/17/2013   Psychotic paranoia ([place]) 04/04/2013   MGUS (monoclonal gammopathy of unknown significance) 05/15/2012   Vitamin D deficiency 01/29/2010   DERMATITIS 09/30/2009   Paranoid delusion ([place]) 08/02/2008   Obstructive chronic bronchitis ([place]) 08/02/2008   Essential hypertension 11/11/2007   Venous (peripheral) insufficiency 11/11/2007   RENAL CALCULUS 11/11/2007   LOW BACK PAIN, CHRONIC 11/11/2007   PCP:  Lauree Chandler, NP Pharmacy:   [facility] [place], [address] AT [facility] OF [address] [place] [address] Phone: [phone] Fax: [phone]     Social Determinants of Health (SDOH) Social History: SDOH Screenings   Food Insecurity: No Food Insecurity (10/20/2022)  Housing: Low Risk  (10/20/2022)  Transportation Needs: No Transportation Needs (10/20/2022)  Utilities: Not At Risk (10/20/2022)  Alcohol Screen: Low Risk  (06/14/2018)  Depression (PHQ2-9): Low Risk  (01/27/2022)  Financial Resource Strain: Low Risk  (03/29/2018)  Physical Activity: Insufficiently Active (03/29/2018)  Social Connections: Somewhat Isolated (03/29/2018)  Stress: No Stress Concern Present (03/29/2018)  Tobacco Use: Low Risk  (10/19/2022)   SDOH Interventions:     Readmission Risk Interventions    10/24/2022   12:29 PM  Readmission Risk Prevention Plan  Transportation Screening Complete  PCP or Specialist Appt within 5-7 Days Complete  Home Care Screening Complete  Medication Review (RN CM) Complete

## 2022-10-24 NOTE — Progress Notes (Signed)
PROGRESS NOTE    [name]  WCH:[id number] DOB: 07-Dec-1931 DOA: 10/19/2022 PCP: Lauree Chandler, NP     Brief Narrative:  [name] is an 88 year old female with past medical history significant for dementia, GERD, HTN, CKD, who present to the hospital with confusion. Per family, they reported that patient was not at her baseline and not getting up as normal.  In the emergency department, she was found to have wheezing.  She was started on steroids as well as empiric antibiotics.  She had elevated BNP and was also ordered for Lasix and cardiology consulted for CHF exacerbation.  Patient lives at home alone, and between caregivers, probably has around 14 to 15 hours of assistance at home. Family is requesting patient to be placed in a facility due to her safety concerns and does not have 24-hour care.   New events last 24 hours / Subjective: Pleasant demeanor, confused, working with OT and taking morning meds   Assessment & Plan:   Principal Problem:   CAP (community acquired pneumonia) Active Problems:   Essential hypertension   MGUS (monoclonal gammopathy of unknown significance)   CKD (chronic kidney disease) stage 3, GFR 30-59 ml/min (HCC)   COPD with acute exacerbation (HCC)   Acute on chronic combined systolic (congestive) and diastolic (congestive) heart failure (HCC)   Dementia with behavioral disturbance (HCC)   Normocytic anemia   Acute metabolic encephalopathy   Elevated troponin   Acute metabolic encephalopathy -Insetting of underlying dementia, pneumonia, heart failure -Zyprexa  -Seems to be her baseline now  CAP -COVID, influenza, RVP negative -Strep pneumo antigen negative -Blood culture 1 bottle showing staph epidermidis - contaminant  -Rocephin, azithromycin   COPD exacerbation -Completed prednisone burst, continue breathing tx   Acute on chronic combined systolic and diastolic heart failure -Cardiology consulted, signed off 12/25   -Echocardiogram showing grade 2 diastolic dysfunction -Resume home torsemide   AKI on CKD stage IIIb -Baseline creatinine 1.3 -Improved with IVF yesterday   Hypertension -Hydralazine, norvasc   Demand ischemia -Insetting of pneumonia, COPD, CHF  TB test -Requested by family member -Ordered 12/22    DVT prophylaxis:  SCDs Start: 10/20/22 0819  Code Status: Full Family Communication: None at bedside Disposition Plan:  Status is: Inpatient Remains inpatient appropriate because: SNF placement   Antimicrobials:  Anti-infectives (From admission, onward)    Start     Dose/Rate Route Frequency Ordered Stop   10/21/22 2200  azithromycin (ZITHROMAX) tablet 500 mg        500 mg Oral Daily at bedtime 10/21/22 1101 10/23/22 2216   10/20/22 2300  cefTRIAXone (ROCEPHIN) 2 g in sodium chloride 0.9 % 100 mL IVPB        2 g 200 mL/hr over 30 Minutes Intravenous Every 24 hours 10/19/22 2338 10/25/22 2259   10/20/22 2300  azithromycin (ZITHROMAX) 500 mg in sodium chloride 0.9 % 250 mL IVPB  Status:  Discontinued        500 mg 250 mL/hr over 60 Minutes Intravenous Every 24 hours 10/19/22 2338 10/21/22 1101   10/19/22 2300  cefTRIAXone (ROCEPHIN) 1 g in sodium chloride 0.9 % 100 mL IVPB        1 g 200 mL/hr over 30 Minutes Intravenous  Once 10/19/22 2255 10/20/22 0048   10/19/22 2300  azithromycin (ZITHROMAX) 500 mg in sodium chloride 0.9 % 250 mL IVPB        500 mg 250 mL/hr over 60 Minutes Intravenous  Once 10/19/22  2255 10/20/22 0546        Objective: Vitals:   10/23/22 1441 10/23/22 2157 10/24/22 0500 10/24/22 0510  BP: (!) 179/81 (!) 183/86  (!) 185/90  Pulse: 74 66  78  Resp: '20 18  18  '$ Temp: 98 F (36.7 C) (!) 97.5 F (36.4 C)  97.8 F (36.6 C)  TempSrc:  Oral  Oral  SpO2: 100% 99%  100%  Weight:   65.5 kg   Height:        Intake/Output Summary (Last 24 hours) at 10/24/2022 1227 Last data filed at 10/24/2022 0845 Gross per 24 hour  Intake 240 ml  Output 1250  ml  Net -1010 ml    Filed Weights   10/21/22 0500 10/23/22 0500 10/24/22 0500  Weight: 64.4 kg 65.3 kg 65.5 kg    Examination:  General exam: Appears calm and comfortable  Respiratory system: CTAB anteriorly, on room air without distress  Cardiovascular system: S1 & S2 heard, RRR. No murmurs. +nonpitting pedal edema. Gastrointestinal system: Abdomen is nondistended, soft and nontender. Normal bowel sounds heard. Extremities: Symmetric in appearance  Skin: +chronic venous stasis, dry skin   Data Reviewed: I have personally reviewed following labs and imaging studies  CBC: Recent Labs  Lab 10/19/22 2051 10/20/22 1040  WBC 6.6 7.4  NEUTROABS 4.8  --   HGB 8.5* 9.1*  HCT 28.7* 30.9*  MCV 88.9 88.5  PLT 249 734    Basic Metabolic Panel: Recent Labs  Lab 10/19/22 2236 10/20/22 1040 10/21/22 1043 10/22/22 0553 10/23/22 0632 10/24/22 0518  NA  --  142 141 137 141 141  K  --  4.2 4.0 3.8 3.9 3.8  CL  --  111 110 109 109 108  CO2  --  19* 20* '22 23 23  '$ GLUCOSE  --  133* 198* 183* 157* 187*  BUN  --  24* 37* 48* 48* 41*  CREATININE  --  1.53* 1.61* 1.67* 1.79* 1.43*  CALCIUM  --  9.1 9.2 9.1 9.1 9.4  MG 1.9 2.2  --   --   --   --   PHOS 3.6 5.1*  --   --   --   --     GFR: Estimated Creatinine Clearance: 22.4 mL/min (A) (by C-G formula based on SCr of 1.43 mg/dL (H)). Liver Function Tests: Recent Labs  Lab 10/19/22 2051 10/20/22 1040  AST 20 20  ALT 14 13  ALKPHOS 64 57  BILITOT 0.7 0.7  PROT 7.7 8.0  ALBUMIN 2.8* 2.8*    No results for input(s): "LIPASE", "AMYLASE" in the last 168 hours. Recent Labs  Lab 10/20/22 0043  AMMONIA 15    Coagulation Profile: No results for input(s): "INR", "PROTIME" in the last 168 hours. Cardiac Enzymes: Recent Labs  Lab 10/19/22 2236  CKTOTAL 250*    BNP (last 3 results) No results for input(s): "PROBNP" in the last 8760 hours. HbA1C: No results for input(s): "HGBA1C" in the last 72 hours. CBG: No results  for input(s): "GLUCAP" in the last 168 hours. Lipid Profile: No results for input(s): "CHOL", "HDL", "LDLCALC", "TRIG", "CHOLHDL", "LDLDIRECT" in the last 72 hours. Thyroid Function Tests: No results for input(s): "TSH", "T4TOTAL", "FREET4", "T3FREE", "THYROIDAB" in the last 72 hours.  Anemia Panel: No results for input(s): "VITAMINB12", "FOLATE", "FERRITIN", "TIBC", "IRON", "RETICCTPCT" in the last 72 hours.  Sepsis Labs: Recent Labs  Lab 10/19/22 2051 10/19/22 2217 10/19/22 2236 10/20/22 1040 10/22/22 0553  PROCALCITON  --   --  2.43 1.29 0.70  LATICACIDVEN 1.5 1.5  --   --   --      Recent Results (from the past 240 hour(s))  Resp panel by RT-PCR (RSV, Flu A&B, Covid) Anterior Nasal Swab     Status: None   Collection Time: 10/19/22  8:51 PM   Specimen: Anterior Nasal Swab  Result Value Ref Range Status   SARS Coronavirus 2 by RT PCR NEGATIVE NEGATIVE Final    Comment: (NOTE) SARS-CoV-2 target nucleic acids are NOT DETECTED.  The SARS-CoV-2 RNA is generally detectable in upper respiratory specimens during the acute phase of infection. The lowest concentration of SARS-CoV-2 viral copies this assay can detect is 138 copies/mL. A negative result does not preclude SARS-Cov-2 infection and should not be used as the sole basis for treatment or other patient management decisions. A negative result may occur with  improper specimen collection/handling, submission of specimen other than nasopharyngeal swab, presence of viral mutation(s) within the areas targeted by this assay, and inadequate number of viral copies(<138 copies/mL). A negative result must be combined with clinical observations, patient history, and epidemiological information. The expected result is Negative.  Fact Sheet for Patients:  EntrepreneurPulse.com.au  Fact Sheet for Healthcare Providers:  IncredibleEmployment.be  This test is no t yet approved or cleared by the  [place] FDA and  has been authorized for detection and/or diagnosis of SARS-CoV-2 by FDA under an Emergency Use Authorization (EUA). This EUA will remain  in effect (meaning this test can be used) for the duration of the COVID-19 declaration under Section 564(b)(1) of the Act, 21 U.S.C.section 360bbb-3(b)(1), unless the authorization is terminated  or revoked sooner.       Influenza A by PCR NEGATIVE NEGATIVE Final   Influenza B by PCR NEGATIVE NEGATIVE Final    Comment: (NOTE) The Xpert Xpress SARS-CoV-2/FLU/RSV plus assay is intended as an aid in the diagnosis of influenza from Nasopharyngeal swab specimens and should not be used as a sole basis for treatment. Nasal washings and aspirates are unacceptable for Xpert Xpress SARS-CoV-2/FLU/RSV testing.  Fact Sheet for Patients: EntrepreneurPulse.com.au  Fact Sheet for Healthcare Providers: IncredibleEmployment.be  This test is not yet approved or cleared by the [place] FDA and has been authorized for detection and/or diagnosis of SARS-CoV-2 by FDA under an Emergency Use Authorization (EUA). This EUA will remain in effect (meaning this test can be used) for the duration of the COVID-19 declaration under Section 564(b)(1) of the Act, 21 U.S.C. section 360bbb-3(b)(1), unless the authorization is terminated or revoked.     Resp Syncytial Virus by PCR NEGATIVE NEGATIVE Final    Comment: (NOTE) Fact Sheet for Patients: EntrepreneurPulse.com.au  Fact Sheet for Healthcare Providers: IncredibleEmployment.be  This test is not yet approved or cleared by the [place] FDA and has been authorized for detection and/or diagnosis of SARS-CoV-2 by FDA under an Emergency Use Authorization (EUA). This EUA will remain in effect (meaning this test can be used) for the duration of the COVID-19 declaration under Section 564(b)(1) of the Act, 21 U.S.C. section  360bbb-3(b)(1), unless the authorization is terminated or revoked.  Performed at [facility], [place] [address] [place], [place] [address]   Blood culture (routine x 2)     Status: None (Preliminary result)   Collection Time: 10/19/22 10:15 PM   Specimen: BLOOD  Result Value Ref Range Status   Specimen Description   Final    BLOOD LEFT ANTECUBITAL Performed at [place]  [address], [place], [place] [address]    Special Requests   Final    BOTTLES DRAWN AEROBIC AND ANAEROBIC Blood Culture results may not be optimal due to an inadequate volume of blood received in culture bottles Performed at [place] [address], [place], [place] [address]    Culture   Final    NO GROWTH 4 DAYS Performed at [facility], [place] [address] [place], [place] [address]    Report Status PENDING  Incomplete  Blood culture (routine x 2)     Status: Abnormal   Collection Time: 10/19/22 10:17 PM   Specimen: BLOOD  Result Value Ref Range Status   Specimen Description   Final    BLOOD RIGHT ANTECUBITAL Performed at [place] [address] [place], [place] [address]    Special Requests   Final    BOTTLES DRAWN AEROBIC AND ANAEROBIC Blood Culture adequate volume Performed at [place] [address] [place], [place] [address]    Culture  Setup Time   Final    GRAM POSITIVE COCCI AEROBIC BOTTLE ONLY CRITICAL RESULT CALLED TO, READ BACK BY AND VERIFIED WITH: L POINDEXTER,PHARMD'@0419'$  10/21/22 [place]    Culture (A)  Final    STAPHYLOCOCCUS EPIDERMIDIS THE SIGNIFICANCE OF ISOLATING THIS ORGANISM FROM A SINGLE SET OF BLOOD CULTURES WHEN MULTIPLE SETS ARE DRAWN IS UNCERTAIN. PLEASE NOTIFY THE MICROBIOLOGY DEPARTMENT WITHIN ONE WEEK IF SPECIATION AND SENSITIVITIES ARE REQUIRED. Performed at [facility], [place] [address] [place],  [address]    Report Status 10/23/2022 FINAL   Final  Blood Culture ID Panel (Reflexed)     Status: Abnormal   Collection Time: 10/19/22 10:17 PM  Result Value Ref Range Status   Enterococcus faecalis NOT DETECTED NOT DETECTED Final   Enterococcus Faecium NOT DETECTED NOT DETECTED Final   Listeria monocytogenes NOT DETECTED NOT DETECTED Final   Staphylococcus species DETECTED (A) NOT DETECTED Final    Comment: CRITICAL RESULT CALLED TO, READ BACK BY AND VERIFIED WITH: L POINDEXTER,PHARMD'@0420'$  10/21/22 [place]    Staphylococcus aureus (BCID) NOT DETECTED NOT DETECTED Final   Staphylococcus epidermidis DETECTED (A) NOT DETECTED Final    Comment: CRITICAL RESULT CALLED TO, READ BACK BY AND VERIFIED WITH: L POINDEXTER,PHARMD'@0420'$  10/21/22 [place]    Staphylococcus lugdunensis NOT DETECTED NOT DETECTED Final   Streptococcus species NOT DETECTED NOT DETECTED Final   Streptococcus agalactiae NOT DETECTED NOT DETECTED Final   Streptococcus pneumoniae NOT DETECTED NOT DETECTED Final   Streptococcus pyogenes NOT DETECTED NOT DETECTED Final   A.calcoaceticus-baumannii NOT DETECTED NOT DETECTED Final   Bacteroides fragilis NOT DETECTED NOT DETECTED Final   Enterobacterales NOT DETECTED NOT DETECTED Final   Enterobacter cloacae complex NOT DETECTED NOT DETECTED Final   Escherichia coli NOT DETECTED NOT DETECTED Final   Klebsiella aerogenes NOT DETECTED NOT DETECTED Final   Klebsiella oxytoca NOT DETECTED NOT DETECTED Final   Klebsiella pneumoniae NOT DETECTED NOT DETECTED Final   Proteus species NOT DETECTED NOT DETECTED Final   Salmonella species NOT DETECTED NOT DETECTED Final   Serratia marcescens NOT DETECTED NOT DETECTED Final   Haemophilus influenzae NOT DETECTED NOT DETECTED Final   Neisseria meningitidis NOT DETECTED NOT DETECTED Final   Pseudomonas aeruginosa NOT DETECTED NOT DETECTED Final   Stenotrophomonas maltophilia NOT DETECTED NOT DETECTED Final   Candida albicans NOT DETECTED NOT DETECTED Final   Candida auris NOT DETECTED NOT  DETECTED Final   Candida glabrata NOT DETECTED NOT  DETECTED Final   Candida krusei NOT DETECTED NOT DETECTED Final   Candida parapsilosis NOT DETECTED NOT DETECTED Final   Candida tropicalis NOT DETECTED NOT DETECTED Final   Cryptococcus neoformans/gattii NOT DETECTED NOT DETECTED Final   Methicillin resistance mecA/C NOT DETECTED NOT DETECTED Final    Comment: Performed at [facility], [place] [address] [place], [place] [address]  Expectorated Sputum Assessment w Gram Stain, Rflx to Resp Cult     Status: None   Collection Time: 10/19/22 10:36 PM   Specimen: Expectorated Sputum  Result Value Ref Range Status   Specimen Description EXPECTORATED SPUTUM  Final   Special Requests NONE  Final   Sputum evaluation   Final    THIS SPECIMEN IS ACCEPTABLE FOR SPUTUM CULTURE Performed at [facility], [place] [address] [place], [place] [address]    Report Status 10/19/2022 FINAL  Final  Culture, Respiratory w Gram Stain     Status: None   Collection Time: 10/19/22 10:36 PM  Result Value Ref Range Status   Specimen Description   Final    EXPECTORATED SPUTUM Performed at [facility], [place] [address], [place], [place] [address]    Special Requests   Final    NONE Reflexed from [id number] Performed at [facility], [place] [address] [place], [place] [address]    Gram Stain   Final    FEW SQUAMOUS EPITHELIAL CELLS PRESENT MODERATE WBC PRESENT,BOTH PMN AND MONONUCLEAR MODERATE GRAM NEGATIVE RODS MODERATE GRAM POSITIVE COCCI IN PAIRS    Culture   Final    ABUNDANT Consistent with normal respiratory flora. No Pseudomonas species isolated Performed at [place] [address] [place], [place] [address]    Report Status 10/22/2022 FINAL  Final  Respiratory (~20 pathogens) panel by PCR     Status: None   Collection Time: 10/20/22 12:43 AM   Specimen: Nasopharyngeal Swab; Respiratory  Result Value Ref Range Status   Adenovirus  NOT DETECTED NOT DETECTED Final   Coronavirus 229E NOT DETECTED NOT DETECTED Final    Comment: (NOTE) The Coronavirus on the Respiratory Panel, DOES NOT test for the novel  Coronavirus (2019 nCoV)    Coronavirus HKU1 NOT DETECTED NOT DETECTED Final   Coronavirus NL63 NOT DETECTED NOT DETECTED Final   Coronavirus OC43 NOT DETECTED NOT DETECTED Final   Metapneumovirus NOT DETECTED NOT DETECTED Final   Rhinovirus / Enterovirus NOT DETECTED NOT DETECTED Final   Influenza A NOT DETECTED NOT DETECTED Final   Influenza B NOT DETECTED NOT DETECTED Final   Parainfluenza Virus 1 NOT DETECTED NOT DETECTED Final   Parainfluenza Virus 2 NOT DETECTED NOT DETECTED Final   Parainfluenza Virus 3 NOT DETECTED NOT DETECTED Final   Parainfluenza Virus 4 NOT DETECTED NOT DETECTED Final   Respiratory Syncytial Virus NOT DETECTED NOT DETECTED Final   Bordetella pertussis NOT DETECTED NOT DETECTED Final   Bordetella Parapertussis NOT DETECTED NOT DETECTED Final   Chlamydophila pneumoniae NOT DETECTED NOT DETECTED Final   Mycoplasma pneumoniae NOT DETECTED NOT DETECTED Final    Comment: Performed at [facility], [place]. [address] [place], [place] [address]      Radiology Studies: No results found.    Scheduled Meds:  amLODipine  5 mg Oral Daily   feeding supplement  237 mL Oral BID BM   guaiFENesin  600 mg Oral BID   hydrALAZINE  50 mg Oral BID   isosorbide mononitrate  30 mg Oral  Daily   multivitamin with minerals  1 tablet Oral Daily   OLANZapine  5 mg Oral QHS   pantoprazole  40 mg Oral Daily   sodium chloride flush  3 mL Intravenous Q12H   torsemide  10 mg Oral Daily   Continuous Infusions:  sodium chloride     cefTRIAXone (ROCEPHIN)  IV 2 g (10/23/22 2220)     LOS: 5 days   Time spent: 25 minutes   Dessa Phi, DO [facility] 10/24/2022, 12:27 PM   Available via Epic secure chat 7am-7pm After these hours, please refer to coverage provider listed on [email or web address]

## 2022-10-24 NOTE — Progress Notes (Signed)
Physical Therapy Treatment Patient Details Name: [name] MRN: [id number] DOB: 11-May-1932 Today's Date: 10/24/2022   History of Present Illness Patient is an 88 y/o female admitted with AMS, weakness and found to have elevated BNP and wheezing.  Admitted for CHF exacerbation and CAP.  PMH significant for dementia, GERD, HTN, CKD, COPD.    PT Comments    Pt progressing slowly, requiring incr assist overall compared to last PT session. Limited by fatigue and pain. Pt is pleasant and cooperative. Have updated d/c plan to SNF  will continue efforts to mobilize  Recommendations for follow up therapy are one component of a multi-disciplinary discharge planning process, led by the attending physician.  Recommendations may be updated based on patient status, additional functional criteria and insurance authorization.  Follow Up Recommendations  Skilled nursing-short term rehab (<3 hours/day) Can patient physically be transported by private vehicle: No   Assistance Recommended at Discharge Frequent or constant Supervision/Assistance  Patient can return home with the following Two people to help with walking and/or transfers   Equipment Recommendations  Other (comment) (defer to SNF)    Recommendations for Other Services       Precautions / Restrictions Precautions Precautions: Fall Restrictions Weight Bearing Restrictions: No     Mobility  Bed Mobility Overal bed mobility: Needs Assistance Bed Mobility: Supine to Sit, Sit to Supine     Supine to sit: Mod assist, HOB elevated Sit to supine: Mod assist   General bed mobility comments: assist for legs off EOB and to lift trunk, pt helping some to scoot to EOB once upright and given increased time, to supine assist for legs onto bed, required cues for attention and sequencing    Transfers Overall transfer level: Needs assistance Equipment used: Rolling walker (2 wheels) Transfers: Sit to/from Stand Sit to Stand: Mod  assist           General transfer comment: STS x2; required increased effort & cues for hand placement and for trunk extension in standing; only able to maintain standing very briefly    Ambulation/Gait                   Stairs             Wheelchair Mobility    Modified Rankin (Stroke Patients Only)       Balance   Sitting-balance support: Feet supported, No upper extremity supported Sitting balance-Leahy Scale: Fair     Standing balance support: Bilateral upper extremity supported, Reliant on assistive device for balance Standing balance-Leahy Scale: Zero                              Cognition Arousal/Alertness: Awake/alert Behavior During Therapy: WFL for tasks assessed/performed Overall Cognitive Status: History of cognitive impairments - at baseline                                          Exercises General Exercises - Lower Extremity Ankle Circles/Pumps: AROM, Both, 10 reps, AAROM Long Arc Quad: AROM, Both, 5 reps, Seated    General Comments        Pertinent Vitals/Pain Pain Assessment Pain Assessment: Faces Faces Pain Scale: Hurts even more Pain Location: diffuse pain "all over" Pain Descriptors / Indicators: Discomfort, Grimacing, Sore Pain Intervention(s): Limited activity within patient's tolerance, Monitored during session, Repositioned  Home Living                          Prior Function            PT Goals (current goals can now be found in the care plan section) Acute Rehab PT Goals PT Goal Formulation: Patient unable to participate in goal setting Time For Goal Achievement: 11/03/22 Potential to Achieve Goals: Fair Progress towards PT goals: Progressing toward goals (slowly)    Frequency    Min 2X/week      PT Plan Discharge plan needs to be updated;Frequency needs to be updated    Co-evaluation              AM-PAC PT "6 Clicks" Mobility   Outcome Measure   Help needed turning from your back to your side while in a flat bed without using bedrails?: A Lot Help needed moving from lying on your back to sitting on the side of a flat bed without using bedrails?: A Lot Help needed moving to and from a bed to a chair (including a wheelchair)?: Total Help needed standing up from a chair using your arms (e.g., wheelchair or bedside chair)?: Total Help needed to walk in hospital room?: Total Help needed climbing 3-5 steps with a railing? : Total 6 Click Score: 8    End of Session Equipment Utilized During Treatment: Gait belt Activity Tolerance: Patient limited by fatigue;Patient limited by pain Patient left: in bed;with call bell/phone within reach;with bed alarm set Nurse Communication: Mobility status PT Visit Diagnosis: Other abnormalities of gait and mobility (R26.89);Muscle weakness (generalized) (M62.81);Other symptoms and signs involving the nervous system (R29.898)     Time: 1030-1049 PT Time Calculation (min) (ACUTE ONLY): 19 min  Charges:  $Therapeutic Activity: 8-22 mins                     Baxter Flattery, PT  Acute Rehab Dept [facility]) [phone]  [facility] Weekend Pager [facility] only)  [phone]  10/24/2022    [facility] 10/24/2022, 10:55 AM

## 2022-10-25 ENCOUNTER — Other Ambulatory Visit: Payer: Medicare Other

## 2022-10-25 DIAGNOSIS — M6281 Muscle weakness (generalized): Secondary | ICD-10-CM | POA: Diagnosis not present

## 2022-10-25 DIAGNOSIS — R41841 Cognitive communication deficit: Secondary | ICD-10-CM | POA: Diagnosis not present

## 2022-10-25 DIAGNOSIS — I5043 Acute on chronic combined systolic (congestive) and diastolic (congestive) heart failure: Secondary | ICD-10-CM | POA: Diagnosis not present

## 2022-10-25 DIAGNOSIS — F03911 Unspecified dementia, unspecified severity, with agitation: Secondary | ICD-10-CM | POA: Diagnosis not present

## 2022-10-25 DIAGNOSIS — J441 Chronic obstructive pulmonary disease with (acute) exacerbation: Secondary | ICD-10-CM | POA: Diagnosis not present

## 2022-10-25 DIAGNOSIS — G4489 Other headache syndrome: Secondary | ICD-10-CM | POA: Diagnosis not present

## 2022-10-25 DIAGNOSIS — F03918 Unspecified dementia, unspecified severity, with other behavioral disturbance: Secondary | ICD-10-CM | POA: Diagnosis not present

## 2022-10-25 DIAGNOSIS — D472 Monoclonal gammopathy: Secondary | ICD-10-CM | POA: Diagnosis not present

## 2022-10-25 DIAGNOSIS — J189 Pneumonia, unspecified organism: Secondary | ICD-10-CM | POA: Diagnosis not present

## 2022-10-25 DIAGNOSIS — J449 Chronic obstructive pulmonary disease, unspecified: Secondary | ICD-10-CM | POA: Diagnosis not present

## 2022-10-25 DIAGNOSIS — Z7401 Bed confinement status: Secondary | ICD-10-CM | POA: Diagnosis not present

## 2022-10-25 DIAGNOSIS — I5033 Acute on chronic diastolic (congestive) heart failure: Secondary | ICD-10-CM | POA: Diagnosis not present

## 2022-10-25 DIAGNOSIS — N183 Chronic kidney disease, stage 3 unspecified: Secondary | ICD-10-CM | POA: Diagnosis not present

## 2022-10-25 DIAGNOSIS — G9341 Metabolic encephalopathy: Secondary | ICD-10-CM | POA: Diagnosis not present

## 2022-10-25 DIAGNOSIS — R1312 Dysphagia, oropharyngeal phase: Secondary | ICD-10-CM | POA: Diagnosis not present

## 2022-10-25 DIAGNOSIS — R278 Other lack of coordination: Secondary | ICD-10-CM | POA: Diagnosis not present

## 2022-10-25 DIAGNOSIS — R293 Abnormal posture: Secondary | ICD-10-CM | POA: Diagnosis not present

## 2022-10-25 DIAGNOSIS — R296 Repeated falls: Secondary | ICD-10-CM | POA: Diagnosis not present

## 2022-10-25 LAB — BASIC METABOLIC PANEL
Anion gap: 7 (ref 5–15)
BUN: 43 mg/dL — ABNORMAL HIGH (ref 8–23)
CO2: 30 mmol/L (ref 22–32)
Calcium: 9.7 mg/dL (ref 8.9–10.3)
Chloride: 104 mmol/L (ref 98–111)
Creatinine, Ser: 1.54 mg/dL — ABNORMAL HIGH (ref 0.44–1.00)
GFR, Estimated: 32 mL/min — ABNORMAL LOW (ref >60)
Glucose, Bld: 96 mg/dL (ref 70–99)
Potassium: 3.7 mmol/L (ref 3.5–5.1)
Sodium: 141 mmol/L (ref 135–145)

## 2022-10-25 LAB — CULTURE, BLOOD (ROUTINE X 2): Culture: NO GROWTH

## 2022-10-25 MED ORDER — ISOSORBIDE MONONITRATE ER 30 MG PO TB24: 30.0000 mg | ORAL_TABLET | Freq: Every day | ORAL | 0 refills | Status: DC

## 2022-10-25 MED ORDER — AMLODIPINE BESYLATE 10 MG PO TABS: 10.0000 mg | ORAL_TABLET | Freq: Every day | ORAL | 0 refills | Status: DC

## 2022-10-25 NOTE — Care Management Important Message (Signed)
Important Message  Patient Details IM Letter placed in Patient's room Name: [name] MRN: [id number] Date of Birth: [birth year]   Medicare Important Message Given:  Yes     Kerin Salen 10/25/2022, 10:07 AM

## 2022-10-25 NOTE — TOC Transition Note (Addendum)
Transition of Care [facility]) - CM/SW Discharge Note   Patient Details  Name: [name] MRN: [id number] Date of Birth: 1932-04-09  Transition of Care [facility]) CM/SW Contact:  Vassie Moselle, LCSW Phone Number: 10/25/2022, 10:22 AM   Clinical Narrative:    Pt's nephew accepted bed offer for [facility]. Pt is able to transfer to SNF today.  Pt will be going to room 203. RN to call report to [phone]. Facility able to accept after 1pm. PTAR will be arranged for transportation. Transportation called at 12:58pm.    Final next level of care: [place] Barriers to Discharge: Barriers Resolved   Patient Goals and CMS Choice CMS Medicare.gov Compare Post Acute Care list provided to:: Patient Represenative (must comment) Choice offered to / list presented to : [place] / Guardian  Discharge Placement     Existing PASRR number confirmed : 10/24/22          Patient chooses bed at: [facility] Patient to be transferred to facility by: [place] Name of family member notified: Alan [place] Patient and family notified of of transfer: 10/25/22  Discharge Plan and Services Additional resources added to the After Visit Summary for   In-house Referral: Clinical Social Work Discharge Planning Services: CM Consult Post Acute Care Choice: [place]          DME Arranged: N/A DME Agency: NA                  Social Determinants of Health ([place]) Interventions SDOH Screenings   Food Insecurity: No Food Insecurity (10/20/2022)  Housing: Low Risk  (10/20/2022)  Transportation Needs: No Transportation Needs (10/20/2022)  Utilities: Not At Risk (10/20/2022)  Alcohol Screen: Low Risk  (06/14/2018)  Depression (PHQ2-9): Low Risk  (01/27/2022)  Financial Resource Strain: Low Risk  (03/29/2018)  Physical Activity: Insufficiently Active (03/29/2018)  Social Connections: Somewhat Isolated (03/29/2018)  Stress: No Stress Concern Present (03/29/2018)   Tobacco Use: Low Risk  (10/19/2022)     Readmission Risk Interventions    10/24/2022   12:29 PM  Readmission Risk Prevention Plan  Transportation Screening Complete  PCP or Specialist Appt within 5-7 Days Complete  Home Care Screening Complete  Medication Review (RN CM) Complete

## 2022-10-25 NOTE — Discharge Summary (Signed)
Physician Discharge Summary  RYNLEE LISBON TIR:[id number] DOB: 05/06/1932 DOA: 10/19/2022  PCP: Lauree Chandler, NP  Admit date: 10/19/2022 Discharge date: 10/25/2022  Admitted From: Home Disposition:  SNF   Recommendations for Outpatient Follow-up:  Follow up with PCP in 1 week Follow up with cardiology  Discharge Condition: Stable, improved CODE STATUS: Full code Diet recommendation: Heart healthy  Brief/Interim Summary: [name] is an 88 year old female with past medical history significant for dementia, GERD, HTN, CKD, who present to the hospital with confusion. Per family, they reported that patient was not at her baseline and not getting up as normal.  In the emergency department, she was found to have wheezing.  She was started on steroids as well as empiric antibiotics.  She had elevated BNP and was also ordered for Lasix and cardiology consulted for CHF exacerbation.   Patient lives at home alone, and between caregivers, probably has around 14 to 15 hours of assistance at home. Family is requesting patient to be placed in a facility due to her safety concerns and does not have 24-hour care.   Patient was diuresed with IV Lasix, transition to torsemide.  She also completed antibiotic therapy for community-acquired pneumonia and steroid burst for COPD.   Discharge Diagnoses:   Principal Problem:   CAP (community acquired pneumonia) Active Problems:   Essential hypertension   MGUS (monoclonal gammopathy of unknown significance)   CKD (chronic kidney disease) stage 3, GFR 30-59 ml/min (HCC)   COPD with acute exacerbation (HCC)   Acute on chronic combined systolic (congestive) and diastolic (congestive) heart failure (HCC)   Dementia with behavioral disturbance (HCC)   Normocytic anemia   Acute metabolic encephalopathy   Elevated troponin   Acute metabolic encephalopathy -Insetting of underlying dementia, pneumonia, heart failure -Zyprexa  -Seems to be her  baseline now   CAP -COVID, influenza, RVP negative -Strep pneumo antigen negative -Blood culture 1 bottle showing staph epidermidis - contaminant  -Completed Rocephin, azithromycin    COPD exacerbation -Completed prednisone burst, continue breathing tx    Acute on chronic combined systolic and diastolic heart failure -Cardiology consulted, signed off 12/25  -Echocardiogram showing grade 2 diastolic dysfunction -Resume home torsemide    AKI on CKD stage IIIb -Baseline creatinine 1.3 -Improved with IVF  -Continue to monitor    Hypertension -Hydralazine, norvasc    Demand ischemia -Insetting of pneumonia, COPD, CHF   TB test -Requested by family member -Ordered 12/22, read negative 12/24   Discharge Instructions  Discharge Instructions     Diet - low sodium heart healthy   Complete by: As directed    Increase activity slowly   Complete by: As directed    No wound care   Complete by: As directed       Allergies as of 10/25/2022       Reactions   Aspirin Other (See Comments)   REACTION: upset stomach   Codeine Other (See Comments)   REACTION: "MAKES MY BODY GO CRAZY; CRAMPS"   Latex Nausea And Vomiting        Medication List     STOP taking these medications    carvedilol 6.25 MG tablet Commonly known as: COREG   potassium chloride SA 20 MEQ tablet Commonly known as: KLOR-CON M       TAKE these medications    acetaminophen 650 MG CR tablet Commonly known as: TYLENOL Take 650 mg by mouth every 8 (eight) hours as needed for pain.   Advair  HFA 115-21 MCG/ACT inhaler Generic drug: fluticasone-salmeterol INHALE 2 PUFFS BY MOUTH TWICE DAILY   albuterol 108 (90 Base) MCG/ACT inhaler Commonly known as: ProAir HFA INHALE 2 PUFFS EVERY 4 HOURS AS NEEDED FOR WHEEZING   amLODipine 10 MG tablet Commonly known as: NORVASC Take 1 tablet (10 mg total) by mouth daily. Start taking on: October 26, 2022   cetirizine 10 MG tablet Commonly known as:  ZYRTEC TAKE 1 TABLET BY MOUTH AS NEEDED FOR ALLERGIES   denosumab 60 MG/ML Sosy injection Commonly known as: PROLIA Inject 60 mg into the skin every 6 (six) months.   FeroSul 325 (65 FE) MG tablet Generic drug: ferrous sulfate TAKE 1 TABLET(325 MG) BY MOUTH DAILY WITH BREAKFAST What changed: See the new instructions.   hydrALAZINE 50 MG tablet Commonly known as: APRESOLINE TAKE 1 TABLET BY MOUTH TWICE DAILY AT 8 AM AND 10 PM   isosorbide mononitrate 30 MG 24 hr tablet Commonly known as: IMDUR Take 1 tablet (30 mg total) by mouth daily. Start taking on: October 26, 2022   OLANZapine 5 MG tablet Commonly known as: ZYPREXA TAKE 1 TABLET(5 MG) BY MOUTH AT BEDTIME   omeprazole 20 MG capsule Commonly known as: PRILOSEC TAKE 1 CAPSULE(20 MG) BY MOUTH DAILY   torsemide 20 MG tablet Commonly known as: DEMADEX TAKE 1/2 TABLET(10 MG) BY MOUTH DAILY        Contact information for follow-up providers     Lauree Chandler, NP Follow up.   Specialty: Geriatric Medicine Contact information: [address]. [place] [address] [phone]         Freada Bergeron, MD Follow up.   Specialties: Cardiology, Radiology Contact information: [address] [place] [address] [phone]              Contact information for after-discharge care     Destination     [facility] Preferred SNF .   Service: Skilled Nursing Contact information: [place] [phone]                    Allergies  Allergen Reactions   Aspirin Other (See Comments)    REACTION: upset stomach   Codeine Other (See Comments)    REACTION: "MAKES MY BODY GO CRAZY; CRAMPS"   Latex Nausea And Vomiting    Consultations: Cardiology    Procedures/Studies: ECHOCARDIOGRAM COMPLETE  Result Date: 10/20/2022    ECHOCARDIOGRAM REPORT   Patient Name:   KRYSTALYNN RIDGEWAY Date of Exam:  10/20/2022 Medical Rec #:  [id number]        Height:       59.0 in Accession #:    [id number]       Weight:       135.0 lb Date of Birth:  21-Jul-1932        BSA:          1.561 m Patient Age:    88 years         BP:           164/65 mmHg Patient Gender: F                HR:           61 bpm. Exam Location:  Inpatient Procedure: 3D Echo, 2D Echo, Cardiac Doppler and Color Doppler Indications:    R07.9* Chest pain, unspecified  History:        Patient has prior history  of Echocardiogram examinations, most                 recent 11/28/2019. COPD, Signs/Symptoms:Edema, Alzheimer's,                 Altered Mental Status and Chest Pain; Risk Factors:Hypertension.  Sonographer:    Roseanna Rainbow RDCS Referring Phys: [place]  1. Left ventricular ejection fraction, by estimation, is 60 to 65%. The left ventricle has normal function. The left ventricle has no regional wall motion abnormalities. There is mild left ventricular hypertrophy. Left ventricular diastolic parameters are consistent with Grade II diastolic dysfunction (pseudonormalization).  2. Right ventricular systolic function is mildly reduced. The right ventricular size is mildly enlarged. There is moderately elevated pulmonary artery systolic pressure.  3. Left atrial size was moderately dilated.  4. There is no evidence of cardiac tamponade.  5. No evidence of mitral valve regurgitation.  6. There is mild calcification of the aortic valve. Aortic valve regurgitation is trivial. Aortic valve sclerosis/calcification is present, without any evidence of aortic stenosis.  7. The inferior vena cava is dilated in size with <50% respiratory variability, suggesting right atrial pressure of 15 mmHg. Comparison(s): No significant change from prior study. FINDINGS  Left Ventricle: Left ventricular ejection fraction, by estimation, is 60 to 65%. The left ventricle has normal function. The left ventricle has no regional wall motion abnormalities. The left  ventricular internal cavity size was normal in size. There is  mild left ventricular hypertrophy. Left ventricular diastolic parameters are consistent with Grade II diastolic dysfunction (pseudonormalization). Right Ventricle: The right ventricular size is mildly enlarged. Right ventricular systolic function is mildly reduced. There is moderately elevated pulmonary artery systolic pressure. The tricuspid regurgitant velocity is 2.99 m/s, and with an assumed right atrial pressure of 15 mmHg, the estimated right ventricular systolic pressure is 16.1 mmHg. Left Atrium: Left atrial size was moderately dilated. Right Atrium: Right atrial size was normal in size. Pericardium: Trivial pericardial effusion is present. There is no evidence of cardiac tamponade. Mitral Valve: No evidence of mitral valve regurgitation. Tricuspid Valve: Tricuspid valve regurgitation is mild. Aortic Valve: There is mild calcification of the aortic valve. Aortic valve regurgitation is trivial. Aortic valve sclerosis/calcification is present, without any evidence of aortic stenosis. Aortic valve mean gradient measures 6.0 mmHg. Aortic valve peak gradient measures 11.4 mmHg. Aortic valve area, by VTI measures 2.05 cm. Pulmonic Valve: Pulmonic valve regurgitation is not visualized. Aorta: The aortic root and ascending aorta are structurally normal, with no evidence of dilitation. Venous: The inferior vena cava is dilated in size with less than 50% respiratory variability, suggesting right atrial pressure of 15 mmHg. IAS/Shunts: No atrial level shunt detected by color flow Doppler.  LEFT VENTRICLE PLAX 2D LVIDd:         4.60 cm     Diastology LVIDs:         3.20 cm     LV e' medial:    3.37 cm/s LV PW:         1.20 cm     LV E/e' medial:  26.9 LV IVS:        1.00 cm     LV e' lateral:   4.06 cm/s LVOT diam:     2.00 cm     LV E/e' lateral: 22.3 LV SV:         79 LV SV Index:   51 LVOT Area:     3.14 cm  LV Volumes (MOD)  LV vol d, MOD A2C: 97.1 ml  LV vol d, MOD A4C: 74.5 ml LV vol s, MOD A2C: 38.0 ml LV vol s, MOD A4C: 27.4 ml LV SV MOD A2C:     59.1 ml LV SV MOD A4C:     74.5 ml LV SV MOD BP:      52.3 ml RIGHT VENTRICLE            IVC RV S prime:     6.50 cm/s  IVC diam: 2.90 cm TAPSE (M-mode): 1.6 cm LEFT ATRIUM             Index        RIGHT ATRIUM           Index LA diam:        4.80 cm 3.08 cm/m   RA Area:     16.70 cm LA Vol (A2C):   68.1 ml 43.64 ml/m  RA Volume:   40.60 ml  26.02 ml/m LA Vol (A4C):   73.8 ml 47.29 ml/m LA Biplane Vol: 71.0 ml 45.49 ml/m  AORTIC VALVE AV Area (Vmax):    2.05 cm AV Area (Vmean):   1.93 cm AV Area (VTI):     2.05 cm AV Vmax:           169.00 cm/s AV Vmean:          111.000 cm/s AV VTI:            0.385 m AV Peak Grad:      11.4 mmHg AV Mean Grad:      6.0 mmHg LVOT Vmax:         110.50 cm/s LVOT Vmean:        68.250 cm/s LVOT VTI:          0.251 m LVOT/AV VTI ratio: 0.65  AORTA Ao Root diam: 2.90 cm Ao Asc diam:  2.80 cm MITRAL VALVE               TRICUSPID VALVE MV Area (PHT): 3.48 cm    TR Peak grad:   35.8 mmHg MV Decel Time: 218 msec    TR Vmax:        299.00 cm/s MV E velocity: 90.60 cm/s MV A velocity: 49.60 cm/s  SHUNTS MV E/A ratio:  1.83        Systemic VTI:  0.25 m                            Systemic Diam: 2.00 cm Phineas Inches Electronically signed by Phineas Inches Signature Date/Time: 10/20/2022/4:04:52 PM    Final    VAS [place] LOWER EXTREMITY VENOUS (DVT)  Result Date: 10/20/2022  Lower Venous DVT Study Patient Name:  ROSAMUND NYLAND  Date of Exam:   10/20/2022 Medical Rec #: [id number]         Accession #:    [id number] Date of Birth: 1931/12/11         Patient Gender: F Patient Age:   45 years Exam Location:  [facility] Procedure:      VAS [place] LOWER EXTREMITY VENOUS (DVT) Referring Phys: Nyoka Lint DOUTOVA --------------------------------------------------------------------------------  Indications: Edema.  Risk Factors: None identified. Limitations: Poor ultrasound/tissue interface and  patient positioning, patient immobility. Comparison Study: No prior studies. Performing Technologist: Oliver Hum RVT  Examination Guidelines: A complete evaluation includes B-mode imaging, spectral Doppler, color Doppler, and power Doppler as needed of all accessible portions of each vessel.  Bilateral testing is considered an integral part of a complete examination. Limited examinations for reoccurring indications may be performed as noted. The reflux portion of the exam is performed with the patient in reverse Trendelenburg.  +---------+---------------+---------+-----------+----------+--------------+ RIGHT    CompressibilityPhasicitySpontaneityPropertiesThrombus Aging +---------+---------------+---------+-----------+----------+--------------+ CFV      Full           Yes      Yes                                 +---------+---------------+---------+-----------+----------+--------------+ SFJ      Full                                                        +---------+---------------+---------+-----------+----------+--------------+ FV Prox  Full                                                        +---------+---------------+---------+-----------+----------+--------------+ FV Mid   Full                                                        +---------+---------------+---------+-----------+----------+--------------+ FV Distal               Yes      Yes                                 +---------+---------------+---------+-----------+----------+--------------+ PFV      Full                                                        +---------+---------------+---------+-----------+----------+--------------+ POP      Full           Yes      Yes                                 +---------+---------------+---------+-----------+----------+--------------+ PTV      Full                                                         +---------+---------------+---------+-----------+----------+--------------+ PERO     Full                                                        +---------+---------------+---------+-----------+----------+--------------+   +---------+---------------+---------+-----------+----------+--------------+ LEFT     CompressibilityPhasicitySpontaneityPropertiesThrombus Aging +---------+---------------+---------+-----------+----------+--------------+ CFV  Full           Yes      Yes                                 +---------+---------------+---------+-----------+----------+--------------+ SFJ      Full                                                        +---------+---------------+---------+-----------+----------+--------------+ FV Prox  Full                                                        +---------+---------------+---------+-----------+----------+--------------+ FV Mid   Full                                                        +---------+---------------+---------+-----------+----------+--------------+ FV Distal               Yes      Yes                                 +---------+---------------+---------+-----------+----------+--------------+ PFV      Full                                                        +---------+---------------+---------+-----------+----------+--------------+ POP      Full           Yes      Yes                                 +---------+---------------+---------+-----------+----------+--------------+ PTV      Full                                                        +---------+---------------+---------+-----------+----------+--------------+ PERO     Full                                                        +---------+---------------+---------+-----------+----------+--------------+     Summary: RIGHT: - There is no evidence of deep vein thrombosis in the lower extremity. However, portions of this  examination were limited- see technologist comments above.  - No cystic structure found in the popliteal fossa.  LEFT: - There is no evidence of deep vein thrombosis in the lower extremity. However, portions of this examination were limited- see  technologist comments above.  - No cystic structure found in the popliteal fossa.  *See table(s) above for measurements and observations. Electronically signed by Deitra Mayo MD on 10/20/2022 at 3:34:37 PM.    Final    CT HEAD WO CONTRAST (5MM)  Result Date: 10/19/2022 CLINICAL DATA:  Mental status change EXAM: CT HEAD WITHOUT CONTRAST TECHNIQUE: Contiguous axial images were obtained from the base of the skull through the vertex without intravenous contrast. RADIATION DOSE REDUCTION: This exam was performed according to the departmental dose-optimization program which includes automated exposure control, adjustment of the mA and/or kV according to patient size and/or use of iterative reconstruction technique. COMPARISON:  CT brain 09/13/2022 FINDINGS: Brain: There is motion degradation. No acute territorial infarction, hemorrhage or intracranial mass. Atrophy and chronic small vessel ischemic changes of the white matter. Stable ventricle size. Vascular: No hyperdense vessels.  Carotid vascular calcification. Skull: Normal. Negative for fracture or focal lesion. Sinuses/Orbits: No acute finding. Other: None IMPRESSION: 1. Motion degraded exam. No definite CT evidence for acute intracranial abnormality. 2. Atrophy and chronic small vessel ischemic changes of the white matter. Electronically Signed   By: Donavan Foil M.D.   On: 10/19/2022 22:02   DG Chest Portable 1 View  Result Date: 10/19/2022 CLINICAL DATA:  Cough, shortness of breath, wheezing EXAM: PORTABLE CHEST 1 VIEW COMPARISON:  09/13/2022 FINDINGS: Mild retrocardiac opacity, suspicious for pneumonia. Cardiomegaly with pulmonary vascular congestion. No frank interstitial edema. No pleural effusion or  pneumothorax. IMPRESSION: Mild retrocardiac opacity, suspicious for pneumonia. Electronically Signed   By: Julian Hy M.D.   On: 10/19/2022 21:21       Discharge Exam: Vitals:   10/24/22 2005 10/25/22 0546  BP: (!) 172/84 (!) 174/88  Pulse: 68 64  Resp: 18 16  Temp: (!) 97.5 F (36.4 C) 97.9 F (36.6 C)  SpO2: 99% 100%    General: Pt is alert, awake, not in acute distress Cardiovascular: RRR, S1/S2 +, no edema Respiratory: CTA bilaterally, no wheezing, no rhonchi, no respiratory distress, no conversational dyspnea, on room air  Abdominal: Soft, NT, ND, bowel sounds + Extremities: nonpitting edema, no cyanosis Psych: +Stable dementia, pleasant demeanor     The results of significant diagnostics from this hospitalization (including imaging, microbiology, ancillary and laboratory) are listed below for reference.     Microbiology: Recent Results (from the past 240 hour(s))  Resp panel by RT-PCR (RSV, Flu A&B, Covid) Anterior Nasal Swab     Status: None   Collection Time: 10/19/22  8:51 PM   Specimen: Anterior Nasal Swab  Result Value Ref Range Status   SARS Coronavirus 2 by RT PCR NEGATIVE NEGATIVE Final    Comment: (NOTE) SARS-CoV-2 target nucleic acids are NOT DETECTED.  The SARS-CoV-2 RNA is generally detectable in upper respiratory specimens during the acute phase of infection. The lowest concentration of SARS-CoV-2 viral copies this assay can detect is 138 copies/mL. A negative result does not preclude SARS-Cov-2 infection and should not be used as the sole basis for treatment or other patient management decisions. A negative result may occur with  improper specimen collection/handling, submission of specimen other than nasopharyngeal swab, presence of viral mutation(s) within the areas targeted by this assay, and inadequate number of viral copies(<138 copies/mL). A negative result must be combined with clinical observations, patient history, and  epidemiological information. The expected result is Negative.  Fact Sheet for Patients:  EntrepreneurPulse.com.au  Fact Sheet for Healthcare Providers:  IncredibleEmployment.be  This test is no t yet approved  or cleared by the [place] and  has been authorized for detection and/or diagnosis of SARS-CoV-2 by FDA under an Emergency Use Authorization (EUA). This EUA will remain  in effect (meaning this test can be used) for the duration of the COVID-19 declaration under Section 564(b)(1) of the Act, 21 U.S.C.section 360bbb-3(b)(1), unless the authorization is terminated  or revoked sooner.       Influenza A by PCR NEGATIVE NEGATIVE Final   Influenza B by PCR NEGATIVE NEGATIVE Final    Comment: (NOTE) The Xpert Xpress SARS-CoV-2/FLU/RSV plus assay is intended as an aid in the diagnosis of influenza from Nasopharyngeal swab specimens and should not be used as a sole basis for treatment. Nasal washings and aspirates are unacceptable for Xpert Xpress SARS-CoV-2/FLU/RSV testing.  Fact Sheet for Patients: EntrepreneurPulse.com.au  Fact Sheet for Healthcare Providers: IncredibleEmployment.be  This test is not yet approved or cleared by the [place] FDA and has been authorized for detection and/or diagnosis of SARS-CoV-2 by FDA under an Emergency Use Authorization (EUA). This EUA will remain in effect (meaning this test can be used) for the duration of the COVID-19 declaration under Section 564(b)(1) of the Act, 21 U.S.C. section 360bbb-3(b)(1), unless the authorization is terminated or revoked.     Resp Syncytial Virus by PCR NEGATIVE NEGATIVE Final    Comment: (NOTE) Fact Sheet for Patients: EntrepreneurPulse.com.au  Fact Sheet for Healthcare Providers: IncredibleEmployment.be  This test is not yet approved or cleared by the [place] FDA and has been  authorized for detection and/or diagnosis of SARS-CoV-2 by FDA under an Emergency Use Authorization (EUA). This EUA will remain in effect (meaning this test can be used) for the duration of the COVID-19 declaration under Section 564(b)(1) of the Act, 21 U.S.C. section 360bbb-3(b)(1), unless the authorization is terminated or revoked.  Performed at [facility], [place] [address], [place], [place] [address]   Blood culture (routine x 2)     Status: None (Preliminary result)   Collection Time: 10/19/22 10:15 PM   Specimen: BLOOD  Result Value Ref Range Status   Specimen Description   Final    BLOOD LEFT ANTECUBITAL Performed at [facility] [address] [place], [place] [address]    Special Requests   Final    BOTTLES DRAWN AEROBIC AND ANAEROBIC Blood Culture results may not be optimal due to an inadequate volume of blood received in culture bottles Performed at [facility] [address], [place], [place] [address]    Culture   Final    NO GROWTH 4 DAYS Performed at [facility], [place] [address], [place], [place] [address]    Report Status PENDING  Incomplete  Blood culture (routine x 2)     Status: Abnormal   Collection Time: 10/19/22 10:17 PM   Specimen: BLOOD  Result Value Ref Range Status   Specimen Description   Final    BLOOD RIGHT ANTECUBITAL Performed at [facility] [address], [place],  [address]    Special Requests   Final    BOTTLES DRAWN AEROBIC AND ANAEROBIC Blood Culture adequate volume Performed at [facility] [address] [place], [place] [address]    Culture  Setup Time   Final    GRAM POSITIVE COCCI AEROBIC BOTTLE ONLY CRITICAL RESULT CALLED TO, READ BACK BY AND VERIFIED WITH: L POINDEXTER,PHARMD'@0419'$  10/21/22 [place]    Culture (A)  Final    STAPHYLOCOCCUS EPIDERMIDIS THE  SIGNIFICANCE OF ISOLATING THIS ORGANISM FROM A SINGLE SET OF BLOOD  CULTURES WHEN MULTIPLE SETS ARE DRAWN IS UNCERTAIN. PLEASE NOTIFY THE MICROBIOLOGY DEPARTMENT WITHIN ONE WEEK IF SPECIATION AND SENSITIVITIES ARE REQUIRED. Performed at [facility], [place] [address] [place], [place] [address]    Report Status 10/23/2022 FINAL  Final  Blood Culture ID Panel (Reflexed)     Status: Abnormal   Collection Time: 10/19/22 10:17 PM  Result Value Ref Range Status   Enterococcus faecalis NOT DETECTED NOT DETECTED Final   Enterococcus Faecium NOT DETECTED NOT DETECTED Final   Listeria monocytogenes NOT DETECTED NOT DETECTED Final   Staphylococcus species DETECTED (A) NOT DETECTED Final    Comment: CRITICAL RESULT CALLED TO, READ BACK BY AND VERIFIED WITH: L POINDEXTER,PHARMD'@0420'$  10/21/22 [place]    Staphylococcus aureus (BCID) NOT DETECTED NOT DETECTED Final   Staphylococcus epidermidis DETECTED (A) NOT DETECTED Final    Comment: CRITICAL RESULT CALLED TO, READ BACK BY AND VERIFIED WITH: L POINDEXTER,PHARMD'@0420'$  10/21/22 [place]    Staphylococcus lugdunensis NOT DETECTED NOT DETECTED Final   Streptococcus species NOT DETECTED NOT DETECTED Final   Streptococcus agalactiae NOT DETECTED NOT DETECTED Final   Streptococcus pneumoniae NOT DETECTED NOT DETECTED Final   Streptococcus pyogenes NOT DETECTED NOT DETECTED Final   A.calcoaceticus-baumannii NOT DETECTED NOT DETECTED Final   Bacteroides fragilis NOT DETECTED NOT DETECTED Final   Enterobacterales NOT DETECTED NOT DETECTED Final   Enterobacter cloacae complex NOT DETECTED NOT DETECTED Final   Escherichia coli NOT DETECTED NOT DETECTED Final   Klebsiella aerogenes NOT DETECTED NOT DETECTED Final   Klebsiella oxytoca NOT DETECTED NOT DETECTED Final   Klebsiella pneumoniae NOT DETECTED NOT DETECTED Final   Proteus species NOT DETECTED NOT DETECTED Final   Salmonella species NOT DETECTED NOT DETECTED Final   Serratia marcescens NOT DETECTED NOT DETECTED Final   Haemophilus influenzae NOT DETECTED NOT DETECTED Final    Neisseria meningitidis NOT DETECTED NOT DETECTED Final   Pseudomonas aeruginosa NOT DETECTED NOT DETECTED Final   Stenotrophomonas maltophilia NOT DETECTED NOT DETECTED Final   Candida albicans NOT DETECTED NOT DETECTED Final   Candida auris NOT DETECTED NOT DETECTED Final   Candida glabrata NOT DETECTED NOT DETECTED Final   Candida krusei NOT DETECTED NOT DETECTED Final   Candida parapsilosis NOT DETECTED NOT DETECTED Final   Candida tropicalis NOT DETECTED NOT DETECTED Final   Cryptococcus neoformans/gattii NOT DETECTED NOT DETECTED Final   Methicillin resistance mecA/C NOT DETECTED NOT DETECTED Final    Comment: Performed at [facility], [address], [place], [place] [address]  Expectorated Sputum Assessment w Gram Stain, Rflx to Resp Cult     Status: None   Collection Time: 10/19/22 10:36 PM   Specimen: Expectorated Sputum  Result Value Ref Range Status   Specimen Description EXPECTORATED SPUTUM  Final   Special Requests NONE  Final   Sputum evaluation   Final    THIS SPECIMEN IS ACCEPTABLE FOR SPUTUM CULTURE Performed at [facility], [place] [address], [place], [place] [address]    Report Status 10/19/2022 FINAL  Final  Culture, Respiratory w Gram Stain     Status: None   Collection Time: 10/19/22 10:36 PM  Result Value Ref Range Status   Specimen Description   Final    EXPECTORATED SPUTUM Performed at [facility], [place] [address], [place], [place] [address]    Special Requests   Final    NONE Reflexed from [phone] Performed at  [facility], [place] [address], [place], [place] [address]    Gram Stain   Final    FEW SQUAMOUS EPITHELIAL CELLS PRESENT MODERATE WBC PRESENT,BOTH PMN AND MONONUCLEAR MODERATE GRAM NEGATIVE RODS MODERATE GRAM POSITIVE COCCI IN PAIRS    Culture   Final    ABUNDANT Consistent with normal respiratory flora. No Pseudomonas species isolated Performed at [place]  [address], [place], [place] [address]    Report Status 10/22/2022 FINAL  Final  Respiratory (~20 pathogens) panel by PCR     Status: None   Collection Time: 10/20/22 12:43 AM   Specimen: Nasopharyngeal Swab; Respiratory  Result Value Ref Range Status   Adenovirus NOT DETECTED NOT DETECTED Final   Coronavirus 229E NOT DETECTED NOT DETECTED Final    Comment: (NOTE) The Coronavirus on the Respiratory Panel, DOES NOT test for the novel  Coronavirus (2019 nCoV)    Coronavirus HKU1 NOT DETECTED NOT DETECTED Final   Coronavirus NL63 NOT DETECTED NOT DETECTED Final   Coronavirus OC43 NOT DETECTED NOT DETECTED Final   Metapneumovirus NOT DETECTED NOT DETECTED Final   Rhinovirus / Enterovirus NOT DETECTED NOT DETECTED Final   Influenza A NOT DETECTED NOT DETECTED Final   Influenza B NOT DETECTED NOT DETECTED Final   Parainfluenza Virus 1 NOT DETECTED NOT DETECTED Final   Parainfluenza Virus 2 NOT DETECTED NOT DETECTED Final   Parainfluenza Virus 3 NOT DETECTED NOT DETECTED Final   Parainfluenza Virus 4 NOT DETECTED NOT DETECTED Final   Respiratory Syncytial Virus NOT DETECTED NOT DETECTED Final   Bordetella pertussis NOT DETECTED NOT DETECTED Final   Bordetella Parapertussis NOT DETECTED NOT DETECTED Final   Chlamydophila pneumoniae NOT DETECTED NOT DETECTED Final   Mycoplasma pneumoniae NOT DETECTED NOT DETECTED Final    Comment: Performed at [facility], [place], [place],  [address]     Labs: BNP (last 3 results) Recent Labs    10/19/22 2051  BNP 8,527.7*   Basic Metabolic Panel: Recent Labs  Lab 10/19/22 2236 10/20/22 1040 10/21/22 1043 10/22/22 0553 10/23/22 0632 10/24/22 0518 10/25/22 0741  NA  --  142 141 137 141 141 141  K  --  4.2 4.0 3.8 3.9 3.8 3.7  CL  --  111 110 109 109 108 104  CO2  --  19* 20* '22 23 23 30  '$ GLUCOSE  --  133* 198* 183* 157* 187* 96  BUN  --  24* 37* 48* 48* 41* 43*  CREATININE  --  1.53* 1.61* 1.67* 1.79* 1.43* 1.54*  CALCIUM   --  9.1 9.2 9.1 9.1 9.4 9.7  MG 1.9 2.2  --   --   --   --   --   PHOS 3.6 5.1*  --   --   --   --   --    Liver Function Tests: Recent Labs  Lab 10/19/22 2051 10/20/22 1040  AST 20 20  ALT 14 13  ALKPHOS 64 57  BILITOT 0.7 0.7  PROT 7.7 8.0  ALBUMIN 2.8* 2.8*   No results for input(s): "LIPASE", "AMYLASE" in the last 168 hours. Recent Labs  Lab 10/20/22 0043  AMMONIA 15   CBC: Recent Labs  Lab 10/19/22 2051 10/20/22 1040  WBC 6.6 7.4  NEUTROABS 4.8  --   HGB 8.5* 9.1*  HCT 28.7* 30.9*  MCV 88.9 88.5  PLT 249 218   Cardiac Enzymes: Recent Labs  Lab 10/19/22 2236  CKTOTAL  250*   BNP: Invalid input(s): "POCBNP" CBG: No results for input(s): "GLUCAP" in the last 168 hours. D-Dimer No results for input(s): "DDIMER" in the last 72 hours. Hgb A1c No results for input(s): "HGBA1C" in the last 72 hours. Lipid Profile No results for input(s): "CHOL", "HDL", "LDLCALC", "TRIG", "CHOLHDL", "LDLDIRECT" in the last 72 hours. Thyroid function studies No results for input(s): "TSH", "T4TOTAL", "T3FREE", "THYROIDAB" in the last 72 hours.  Invalid input(s): "FREET3" Anemia work up No results for input(s): "VITAMINB12", "FOLATE", "FERRITIN", "TIBC", "IRON", "RETICCTPCT" in the last 72 hours. Urinalysis    Component Value Date/Time   COLORURINE YELLOW 10/19/2022 2236   APPEARANCEUR CLEAR 10/19/2022 2236   LABSPEC 1.013 10/19/2022 2236   PHURINE 5.0 10/19/2022 2236   GLUCOSEU NEGATIVE 10/19/2022 2236   GLUCOSEU NEGATIVE 05/26/2011 1020   HGBUR NEGATIVE 10/19/2022 2236   BILIRUBINUR NEGATIVE 10/19/2022 2236   KETONESUR NEGATIVE 10/19/2022 2236   PROTEINUR 100 (A) 10/19/2022 2236   UROBILINOGEN 1.0 04/03/2015 1830   NITRITE NEGATIVE 10/19/2022 2236   LEUKOCYTESUR NEGATIVE 10/19/2022 2236   Sepsis Labs Recent Labs  Lab 10/19/22 2051 10/20/22 1040  WBC 6.6 7.4   Microbiology Recent Results (from the past 240 hour(s))  Resp panel by RT-PCR (RSV, Flu A&B, Covid)  Anterior Nasal Swab     Status: None   Collection Time: 10/19/22  8:51 PM   Specimen: Anterior Nasal Swab  Result Value Ref Range Status   SARS Coronavirus 2 by RT PCR NEGATIVE NEGATIVE Final    Comment: (NOTE) SARS-CoV-2 target nucleic acids are NOT DETECTED.  The SARS-CoV-2 RNA is generally detectable in upper respiratory specimens during the acute phase of infection. The lowest concentration of SARS-CoV-2 viral copies this assay can detect is 138 copies/mL. A negative result does not preclude SARS-Cov-2 infection and should not be used as the sole basis for treatment or other patient management decisions. A negative result may occur with  improper specimen collection/handling, submission of specimen other than nasopharyngeal swab, presence of viral mutation(s) within the areas targeted by this assay, and inadequate number of viral copies(<138 copies/mL). A negative result must be combined with clinical observations, patient history, and epidemiological information. The expected result is Negative.  Fact Sheet for Patients:  EntrepreneurPulse.com.au  Fact Sheet for Healthcare Providers:  IncredibleEmployment.be  This test is no t yet approved or cleared by the [place] FDA and  has been authorized for detection and/or diagnosis of SARS-CoV-2 by FDA under an Emergency Use Authorization (EUA). This EUA will remain  in effect (meaning this test can be used) for the duration of the COVID-19 declaration under Section 564(b)(1) of the Act, 21 U.S.C.section 360bbb-3(b)(1), unless the authorization is terminated  or revoked sooner.       Influenza A by PCR NEGATIVE NEGATIVE Final   Influenza B by PCR NEGATIVE NEGATIVE Final    Comment: (NOTE) The Xpert Xpress SARS-CoV-2/FLU/RSV plus assay is intended as an aid in the diagnosis of influenza from Nasopharyngeal swab specimens and should not be used as a sole basis for treatment. Nasal washings  and aspirates are unacceptable for Xpert Xpress SARS-CoV-2/FLU/RSV testing.  Fact Sheet for Patients: EntrepreneurPulse.com.au  Fact Sheet for Healthcare Providers: IncredibleEmployment.be  This test is not yet approved or cleared by the [place] FDA and has been authorized for detection and/or diagnosis of SARS-CoV-2 by FDA under an Emergency Use Authorization (EUA). This EUA will remain in effect (meaning this test can be used) for the duration of the COVID-19 declaration  under Section 564(b)(1) of the Act, 21 U.S.C. section 360bbb-3(b)(1), unless the authorization is terminated or revoked.     Resp Syncytial Virus by PCR NEGATIVE NEGATIVE Final    Comment: (NOTE) Fact Sheet for Patients: EntrepreneurPulse.com.au  Fact Sheet for Healthcare Providers: IncredibleEmployment.be  This test is not yet approved or cleared by the [place] FDA and has been authorized for detection and/or diagnosis of SARS-CoV-2 by FDA under an Emergency Use Authorization (EUA). This EUA will remain in effect (meaning this test can be used) for the duration of the COVID-19 declaration under Section 564(b)(1) of the Act, 21 U.S.C. section 360bbb-3(b)(1), unless the authorization is terminated or revoked.  Performed at [facility], [place] [address] [place], [place] [address]   Blood culture (routine x 2)     Status: None (Preliminary result)   Collection Time: 10/19/22 10:15 PM   Specimen: BLOOD  Result Value Ref Range Status   Specimen Description   Final    BLOOD LEFT ANTECUBITAL Performed at [facility] [address] [name], [place] [address]    Special Requests   Final    BOTTLES DRAWN AEROBIC AND ANAEROBIC Blood Culture results may not be optimal due to an inadequate volume of blood received in culture bottles Performed at [place]  [address] [place], [place] [address]    Culture   Final    NO GROWTH 4 DAYS Performed at [facility], [place] [address], [place], [place] [address]    Report Status PENDING  Incomplete  Blood culture (routine x 2)     Status: Abnormal   Collection Time: 10/19/22 10:17 PM   Specimen: BLOOD  Result Value Ref Range Status   Specimen Description   Final    BLOOD RIGHT ANTECUBITAL Performed at [facility] [address] [place], [place] [address]    Special Requests   Final    BOTTLES DRAWN AEROBIC AND ANAEROBIC Blood Culture adequate volume Performed at [facility] [address], [place], [place] [address]    Culture  Setup Time   Final    GRAM POSITIVE COCCI AEROBIC BOTTLE ONLY CRITICAL RESULT CALLED TO, READ BACK BY AND VERIFIED WITH: L POINDEXTER,PHARMD'@0419'$  10/21/22 [place]    Culture (A)  Final    STAPHYLOCOCCUS EPIDERMIDIS THE SIGNIFICANCE OF ISOLATING THIS ORGANISM FROM A SINGLE SET OF BLOOD CULTURES WHEN MULTIPLE SETS ARE DRAWN IS UNCERTAIN. PLEASE NOTIFY THE MICROBIOLOGY DEPARTMENT WITHIN ONE WEEK IF SPECIATION AND SENSITIVITIES ARE REQUIRED. Performed at [facility], [place] [address], [place], [place] [address]    Report Status 10/23/2022 FINAL  Final  Blood Culture ID Panel (Reflexed)     Status: Abnormal   Collection Time: 10/19/22 10:17 PM  Result Value Ref Range Status   Enterococcus faecalis NOT DETECTED NOT DETECTED Final   Enterococcus Faecium NOT DETECTED NOT DETECTED Final   Listeria monocytogenes NOT DETECTED NOT DETECTED Final   Staphylococcus species DETECTED (A) NOT DETECTED Final    Comment: CRITICAL RESULT CALLED TO, READ BACK BY AND VERIFIED WITH: L POINDEXTER,PHARMD'@0420'$  10/21/22 [place]    Staphylococcus aureus (BCID) NOT DETECTED NOT DETECTED Final   Staphylococcus epidermidis DETECTED (A) NOT DETECTED Final    Comment: CRITICAL RESULT CALLED TO, READ BACK BY AND VERIFIED WITH: L POINDEXTER,PHARMD'@0420'$   10/21/22 [place]    Staphylococcus lugdunensis NOT DETECTED NOT DETECTED Final   Streptococcus species NOT DETECTED NOT DETECTED Final   Streptococcus agalactiae NOT  DETECTED NOT DETECTED Final   Streptococcus pneumoniae NOT DETECTED NOT DETECTED Final   Streptococcus pyogenes NOT DETECTED NOT DETECTED Final   A.calcoaceticus-baumannii NOT DETECTED NOT DETECTED Final   Bacteroides fragilis NOT DETECTED NOT DETECTED Final   Enterobacterales NOT DETECTED NOT DETECTED Final   Enterobacter cloacae complex NOT DETECTED NOT DETECTED Final   Escherichia coli NOT DETECTED NOT DETECTED Final   Klebsiella aerogenes NOT DETECTED NOT DETECTED Final   Klebsiella oxytoca NOT DETECTED NOT DETECTED Final   Klebsiella pneumoniae NOT DETECTED NOT DETECTED Final   Proteus species NOT DETECTED NOT DETECTED Final   Salmonella species NOT DETECTED NOT DETECTED Final   Serratia marcescens NOT DETECTED NOT DETECTED Final   Haemophilus influenzae NOT DETECTED NOT DETECTED Final   Neisseria meningitidis NOT DETECTED NOT DETECTED Final   Pseudomonas aeruginosa NOT DETECTED NOT DETECTED Final   Stenotrophomonas maltophilia NOT DETECTED NOT DETECTED Final   Candida albicans NOT DETECTED NOT DETECTED Final   Candida auris NOT DETECTED NOT DETECTED Final   Candida glabrata NOT DETECTED NOT DETECTED Final   Candida krusei NOT DETECTED NOT DETECTED Final   Candida parapsilosis NOT DETECTED NOT DETECTED Final   Candida tropicalis NOT DETECTED NOT DETECTED Final   Cryptococcus neoformans/gattii NOT DETECTED NOT DETECTED Final   Methicillin resistance mecA/C NOT DETECTED NOT DETECTED Final    Comment: Performed at [facility], [address] [place], [place] [address]  Expectorated Sputum Assessment w Gram Stain, Rflx to Resp Cult     Status: None   Collection Time: 10/19/22 10:36 PM   Specimen: Expectorated Sputum  Result Value Ref Range Status   Specimen Description EXPECTORATED SPUTUM  Final   Special Requests  NONE  Final   Sputum evaluation   Final    THIS SPECIMEN IS ACCEPTABLE FOR SPUTUM CULTURE Performed at [facility], [place] [address], [place], [place] [address]    Report Status 10/19/2022 FINAL  Final  Culture, Respiratory w Gram Stain     Status: None   Collection Time: 10/19/22 10:36 PM  Result Value Ref Range Status   Specimen Description   Final    EXPECTORATED SPUTUM Performed at [facility], [place] [address] [place], [place] [address]    Special Requests   Final    NONE Reflexed from [id number] Performed at [facility], [place] [address] [place], [place] [address]    Gram Stain   Final    FEW SQUAMOUS EPITHELIAL CELLS PRESENT MODERATE WBC PRESENT,BOTH PMN AND MONONUCLEAR MODERATE GRAM NEGATIVE RODS MODERATE GRAM POSITIVE COCCI IN PAIRS    Culture   Final    ABUNDANT Consistent with normal respiratory flora. No Pseudomonas species isolated Performed at [facility] [address] [place], [place] [address]    Report Status 10/22/2022 FINAL  Final  Respiratory (~20 pathogens) panel by PCR     Status: None   Collection Time: 10/20/22 12:43 AM   Specimen: Nasopharyngeal Swab; Respiratory  Result Value Ref Range Status   Adenovirus NOT DETECTED NOT DETECTED Final   Coronavirus 229E NOT DETECTED NOT DETECTED Final    Comment: (NOTE) The Coronavirus on the Respiratory Panel, DOES NOT test for the novel  Coronavirus (2019 nCoV)    Coronavirus HKU1 NOT DETECTED NOT DETECTED Final   Coronavirus NL63 NOT DETECTED NOT DETECTED Final   Coronavirus OC43 NOT DETECTED NOT DETECTED Final   Metapneumovirus NOT DETECTED NOT DETECTED Final   Rhinovirus / Enterovirus NOT  DETECTED NOT DETECTED Final   Influenza A NOT DETECTED NOT DETECTED Final   Influenza B NOT DETECTED NOT DETECTED Final   Parainfluenza Virus 1 NOT DETECTED NOT DETECTED Final   Parainfluenza Virus 2 NOT DETECTED NOT DETECTED Final   Parainfluenza Virus 3  NOT DETECTED NOT DETECTED Final   Parainfluenza Virus 4 NOT DETECTED NOT DETECTED Final   Respiratory Syncytial Virus NOT DETECTED NOT DETECTED Final   Bordetella pertussis NOT DETECTED NOT DETECTED Final   Bordetella Parapertussis NOT DETECTED NOT DETECTED Final   Chlamydophila pneumoniae NOT DETECTED NOT DETECTED Final   Mycoplasma pneumoniae NOT DETECTED NOT DETECTED Final    Comment: Performed at [facility], [place] [address], [place], [place] [address]     Patient was seen and examined on the day of discharge and was found to be in stable condition. Time coordinating discharge: 25 minutes including assessment and coordination of care, as well as examination of the patient.   SIGNED:  Dessa Phi, DO [facility] 10/25/2022, 9:40 AM

## 2022-10-25 NOTE — Progress Notes (Signed)
Occupational Therapy Treatment Patient Details Name: [name] MRN: [id number] DOB: 03-16-1932 Today's Date: 10/25/2022   History of present illness Patient is an 88 y/o female admitted with AMS, weakness and found to have elevated BNP and wheezing.  Admitted for CHF exacerbation and CAP.  PMH significant for dementia, GERD, HTN, CKD, COPD.   OT comments  Patient was noted to be with impaired memory and disorientation to place, time, and situation. She needed occasional redirection to tasks and assist with multi-step problem-solving tasks. She performed grooming with set-up assist seated EOB, and she was able to perform two sit to stand transfers using a RW. She was with unsteadiness in standing, and needed assist to take lateral steps towards the head of the bed using a RW. She will continue to benefit from further OT services to maximize her safety and independence with self-care tasks.    Recommendations for follow up therapy are one component of a multi-disciplinary discharge planning process, led by the attending physician.  Recommendations may be updated based on patient status, additional functional criteria and insurance authorization.    Follow Up Recommendations  Skilled nursing-short term rehab (<3 hours/day)     Assistance Recommended at Discharge Frequent or constant Supervision/Assistance  Patient can return home with the following  A lot of help with bathing/dressing/bathroom;Direct supervision/assist for medications management;Direct supervision/assist for financial management;Assistance with cooking/housework;Assist for transportation;A lot of help with walking and/or transfers   Equipment Recommendations  Other (comment) (to be determined pending progress at next setting)       Precautions / Restrictions Precautions Precautions: Fall Restrictions Weight Bearing Restrictions: No       Mobility Bed Mobility Overal bed mobility: Needs Assistance Bed Mobility:  Supine to Sit, Sit to Supine     Supine to sit: Mod assist, HOB elevated Sit to supine: Mod assist   General bed mobility comments: assist for legs off EOB and to lift trunk, pt helping some to scoot to EOB once upright and given increased time, to supine assist for legs onto bed    Transfers Overall transfer level: Needs assistance Equipment used: Rolling walker (2 wheels) Transfers: Sit to/from Stand Sit to Stand: Mod assist           General transfer comment: Sit to stand x2; required increased effort & cues for hand placement and for trunk extension in standing; instructed on lateral steps towards HOB during the 2nd stand, for which she needed mod assist & increased cues for walker advancement and BLE step sequencing         ADL either performed or assessed with clinical judgement   ADL Overall ADL's : Needs assistance/impaired Eating/Feeding: Bed level;Set up Eating/Feeding Details (indicate cue type and reason): She required assist to cut food, remove lids, and open containers. She then performed self-feeding in bed. Min cues were needed for correct item placement on tray, to facilitate improved ease of accessing food. Grooming: Set up;Supervision/safety Grooming Details (indicate cue type and reason): She performed face and hand washing seated EOB.             Lower Body Dressing: Total assistance Lower Body Dressing Details (indicate cue type and reason): for sock management seated EOB                      Cognition Arousal/Alertness: Awake/alert   Overall Cognitive Status: History of cognitive impairments - at baseline Area of Impairment: Memory, Orientation  Orientation Level: Place, Time, Disoriented to                                 Pertinent Vitals/ Pain       Pain Assessment Pain Assessment: Faces Pain Score: 3  Pain Location: BLE to touch/pressure Pain Intervention(s): Limited activity within patient's  tolerance, Repositioned         Frequency  Min 2X/week        Progress Toward Goals  OT Goals(current goals can now be found in the care plan section)  Progress towards OT goals: Progressing toward goals  Acute Rehab OT Goals Patient Stated Goal: to get better OT Goal Formulation: With patient Time For Goal Achievement: 11/04/22 Potential to Achieve Goals: [place] Discharge plan remains appropriate       AM-PAC OT "6 Clicks" Daily Activity     Outcome Measure   Help from another person eating meals?: None Help from another person taking care of personal grooming?: A Little Help from another person toileting, which includes using toliet, bedpan, or urinal?: A Lot Help from another person bathing (including washing, rinsing, drying)?: A Lot Help from another person to put on and taking off regular upper body clothing?: A Lot Help from another person to put on and taking off regular lower body clothing?: A Lot 6 Click Score: 15    End of Session Equipment Utilized During Treatment: Gait belt;Rolling walker (2 wheels)  OT Visit Diagnosis: Unsteadiness on feet (R26.81);Muscle weakness (generalized) (M62.81)   Activity Tolerance Other (comment) (Fair overall tolerance)   Patient Left in bed;with call bell/phone within reach;with bed alarm set   Nurse Communication Mobility status        Time: 5038-8828 OT Time Calculation (min): 25 min  Charges: OT General Charges $OT Visit: 1 Visit OT Treatments $Self Care/Home Management : 8-22 mins $Therapeutic Activity: 8-22 mins    Leota Sauers, OTR/L 10/25/2022, 11:22 AM

## 2022-10-27 DIAGNOSIS — F03918 Unspecified dementia, unspecified severity, with other behavioral disturbance: Secondary | ICD-10-CM | POA: Diagnosis not present

## 2022-10-27 DIAGNOSIS — J449 Chronic obstructive pulmonary disease, unspecified: Secondary | ICD-10-CM | POA: Diagnosis not present

## 2022-10-27 DIAGNOSIS — I5033 Acute on chronic diastolic (congestive) heart failure: Secondary | ICD-10-CM | POA: Diagnosis not present

## 2022-10-27 DIAGNOSIS — J441 Chronic obstructive pulmonary disease with (acute) exacerbation: Secondary | ICD-10-CM | POA: Diagnosis not present

## 2022-10-31 ENCOUNTER — Telehealth: Payer: Self-pay

## 2022-10-31 LAB — LEGIONELLA PNEUMOPHILA SEROGP 1 UR AG: L. pneumophila Serogp 1 Ur Ag: NEGATIVE

## 2022-10-31 NOTE — Telephone Encounter (Signed)
[name], patient's nephew to ask if he still needed the [facility] form. Left message for patient to call office.

## 2022-11-05 DIAGNOSIS — I5021 Acute systolic (congestive) heart failure: Secondary | ICD-10-CM | POA: Diagnosis not present

## 2022-11-05 DIAGNOSIS — N1832 Chronic kidney disease, stage 3b: Secondary | ICD-10-CM | POA: Diagnosis not present

## 2022-11-05 DIAGNOSIS — I130 Hypertensive heart and chronic kidney disease with heart failure and stage 1 through stage 4 chronic kidney disease, or unspecified chronic kidney disease: Secondary | ICD-10-CM | POA: Diagnosis not present

## 2022-11-05 DIAGNOSIS — G9340 Encephalopathy, unspecified: Secondary | ICD-10-CM | POA: Diagnosis not present

## 2022-11-05 DIAGNOSIS — J449 Chronic obstructive pulmonary disease, unspecified: Secondary | ICD-10-CM | POA: Diagnosis not present

## 2022-11-05 DIAGNOSIS — M810 Age-related osteoporosis without current pathological fracture: Secondary | ICD-10-CM | POA: Diagnosis not present

## 2022-11-05 DIAGNOSIS — J189 Pneumonia, unspecified organism: Secondary | ICD-10-CM | POA: Diagnosis not present

## 2022-11-05 DIAGNOSIS — I5022 Chronic systolic (congestive) heart failure: Secondary | ICD-10-CM | POA: Diagnosis not present

## 2022-11-05 DIAGNOSIS — K219 Gastro-esophageal reflux disease without esophagitis: Secondary | ICD-10-CM | POA: Diagnosis not present

## 2022-11-05 DIAGNOSIS — E611 Iron deficiency: Secondary | ICD-10-CM | POA: Diagnosis not present

## 2022-11-05 DIAGNOSIS — F0393 Unspecified dementia, unspecified severity, with mood disturbance: Secondary | ICD-10-CM | POA: Diagnosis not present

## 2022-11-05 DIAGNOSIS — I5032 Chronic diastolic (congestive) heart failure: Secondary | ICD-10-CM | POA: Diagnosis not present

## 2022-11-06 DIAGNOSIS — I130 Hypertensive heart and chronic kidney disease with heart failure and stage 1 through stage 4 chronic kidney disease, or unspecified chronic kidney disease: Secondary | ICD-10-CM | POA: Diagnosis not present

## 2022-11-06 DIAGNOSIS — I5022 Chronic systolic (congestive) heart failure: Secondary | ICD-10-CM | POA: Diagnosis not present

## 2022-11-06 DIAGNOSIS — E611 Iron deficiency: Secondary | ICD-10-CM | POA: Diagnosis not present

## 2022-11-06 DIAGNOSIS — F0393 Unspecified dementia, unspecified severity, with mood disturbance: Secondary | ICD-10-CM | POA: Diagnosis not present

## 2022-11-06 DIAGNOSIS — J449 Chronic obstructive pulmonary disease, unspecified: Secondary | ICD-10-CM | POA: Diagnosis not present

## 2022-11-09 DIAGNOSIS — I5022 Chronic systolic (congestive) heart failure: Secondary | ICD-10-CM | POA: Diagnosis not present

## 2022-11-09 DIAGNOSIS — F0393 Unspecified dementia, unspecified severity, with mood disturbance: Secondary | ICD-10-CM | POA: Diagnosis not present

## 2022-11-09 DIAGNOSIS — J449 Chronic obstructive pulmonary disease, unspecified: Secondary | ICD-10-CM | POA: Diagnosis not present

## 2022-11-09 DIAGNOSIS — N390 Urinary tract infection, site not specified: Secondary | ICD-10-CM | POA: Diagnosis not present

## 2022-11-11 DIAGNOSIS — M810 Age-related osteoporosis without current pathological fracture: Secondary | ICD-10-CM | POA: Diagnosis not present

## 2022-11-11 DIAGNOSIS — I130 Hypertensive heart and chronic kidney disease with heart failure and stage 1 through stage 4 chronic kidney disease, or unspecified chronic kidney disease: Secondary | ICD-10-CM | POA: Diagnosis not present

## 2022-11-11 DIAGNOSIS — I5032 Chronic diastolic (congestive) heart failure: Secondary | ICD-10-CM | POA: Diagnosis not present

## 2022-11-11 DIAGNOSIS — K219 Gastro-esophageal reflux disease without esophagitis: Secondary | ICD-10-CM | POA: Diagnosis not present

## 2022-11-11 DIAGNOSIS — J449 Chronic obstructive pulmonary disease, unspecified: Secondary | ICD-10-CM | POA: Diagnosis not present

## 2022-11-11 DIAGNOSIS — J309 Allergic rhinitis, unspecified: Secondary | ICD-10-CM | POA: Diagnosis not present

## 2022-11-11 DIAGNOSIS — I5022 Chronic systolic (congestive) heart failure: Secondary | ICD-10-CM | POA: Diagnosis not present

## 2022-11-11 DIAGNOSIS — F0393 Unspecified dementia, unspecified severity, with mood disturbance: Secondary | ICD-10-CM | POA: Diagnosis not present

## 2022-11-13 ENCOUNTER — Encounter: Payer: Medicare Other | Admitting: Occupational Therapy

## 2022-11-15 ENCOUNTER — Inpatient Hospital Stay (HOSPITAL_COMMUNITY)
Admission: EM | Admit: 2022-11-15 | Discharge: 2022-11-23 | DRG: 641 | Disposition: A | Discharge status: Skilled Nursing Facility | Payer: Medicare Other | Source: Skilled Nursing Facility | Attending: Internal Medicine | Admitting: Internal Medicine

## 2022-11-15 ENCOUNTER — Encounter (HOSPITAL_COMMUNITY): Payer: Self-pay

## 2022-11-15 ENCOUNTER — Ambulatory Visit: Payer: Medicare Other | Admitting: Nurse Practitioner

## 2022-11-15 ENCOUNTER — Other Ambulatory Visit: Payer: Self-pay

## 2022-11-15 DIAGNOSIS — I5032 Chronic diastolic (congestive) heart failure: Secondary | ICD-10-CM | POA: Diagnosis present

## 2022-11-15 DIAGNOSIS — R296 Repeated falls: Secondary | ICD-10-CM | POA: Diagnosis not present

## 2022-11-15 DIAGNOSIS — F0390 Unspecified dementia without behavioral disturbance: Secondary | ICD-10-CM | POA: Diagnosis present

## 2022-11-15 DIAGNOSIS — E876 Hypokalemia: Secondary | ICD-10-CM | POA: Diagnosis present

## 2022-11-15 DIAGNOSIS — Z885 Allergy status to narcotic agent status: Secondary | ICD-10-CM

## 2022-11-15 DIAGNOSIS — Z833 Family history of diabetes mellitus: Secondary | ICD-10-CM | POA: Diagnosis not present

## 2022-11-15 DIAGNOSIS — Z886 Allergy status to analgesic agent status: Secondary | ICD-10-CM | POA: Diagnosis not present

## 2022-11-15 DIAGNOSIS — F03911 Unspecified dementia, unspecified severity, with agitation: Secondary | ICD-10-CM | POA: Diagnosis not present

## 2022-11-15 DIAGNOSIS — E871 Hypo-osmolality and hyponatremia: Secondary | ICD-10-CM | POA: Diagnosis present

## 2022-11-15 DIAGNOSIS — Z66 Do not resuscitate: Secondary | ICD-10-CM | POA: Diagnosis present

## 2022-11-15 DIAGNOSIS — R627 Adult failure to thrive: Secondary | ICD-10-CM | POA: Diagnosis present

## 2022-11-15 DIAGNOSIS — N1832 Chronic kidney disease, stage 3b: Secondary | ICD-10-CM | POA: Diagnosis present

## 2022-11-15 DIAGNOSIS — R531 Weakness: Secondary | ICD-10-CM | POA: Diagnosis not present

## 2022-11-15 DIAGNOSIS — R6889 Other general symptoms and signs: Secondary | ICD-10-CM | POA: Diagnosis not present

## 2022-11-15 DIAGNOSIS — I872 Venous insufficiency (chronic) (peripheral): Secondary | ICD-10-CM | POA: Diagnosis present

## 2022-11-15 DIAGNOSIS — Z803 Family history of malignant neoplasm of breast: Secondary | ICD-10-CM

## 2022-11-15 DIAGNOSIS — I5030 Unspecified diastolic (congestive) heart failure: Secondary | ICD-10-CM | POA: Diagnosis present

## 2022-11-15 DIAGNOSIS — R1312 Dysphagia, oropharyngeal phase: Secondary | ICD-10-CM | POA: Diagnosis not present

## 2022-11-15 DIAGNOSIS — I130 Hypertensive heart and chronic kidney disease with heart failure and stage 1 through stage 4 chronic kidney disease, or unspecified chronic kidney disease: Secondary | ICD-10-CM | POA: Diagnosis present

## 2022-11-15 DIAGNOSIS — Z8249 Family history of ischemic heart disease and other diseases of the circulatory system: Secondary | ICD-10-CM | POA: Diagnosis not present

## 2022-11-15 DIAGNOSIS — J449 Chronic obstructive pulmonary disease, unspecified: Secondary | ICD-10-CM | POA: Diagnosis present

## 2022-11-15 DIAGNOSIS — M6281 Muscle weakness (generalized): Secondary | ICD-10-CM | POA: Diagnosis not present

## 2022-11-15 DIAGNOSIS — R293 Abnormal posture: Secondary | ICD-10-CM | POA: Diagnosis not present

## 2022-11-15 DIAGNOSIS — G9341 Metabolic encephalopathy: Secondary | ICD-10-CM | POA: Diagnosis not present

## 2022-11-15 DIAGNOSIS — Z7951 Long term (current) use of inhaled steroids: Secondary | ICD-10-CM

## 2022-11-15 DIAGNOSIS — E878 Other disorders of electrolyte and fluid balance, not elsewhere classified: Secondary | ICD-10-CM | POA: Diagnosis present

## 2022-11-15 DIAGNOSIS — J441 Chronic obstructive pulmonary disease with (acute) exacerbation: Secondary | ICD-10-CM | POA: Diagnosis not present

## 2022-11-15 DIAGNOSIS — N183 Chronic kidney disease, stage 3 unspecified: Secondary | ICD-10-CM | POA: Diagnosis not present

## 2022-11-15 DIAGNOSIS — Z515 Encounter for palliative care: Secondary | ICD-10-CM

## 2022-11-15 DIAGNOSIS — M549 Dorsalgia, unspecified: Secondary | ICD-10-CM | POA: Diagnosis present

## 2022-11-15 DIAGNOSIS — E860 Dehydration: Secondary | ICD-10-CM | POA: Diagnosis present

## 2022-11-15 DIAGNOSIS — N179 Acute kidney failure, unspecified: Secondary | ICD-10-CM | POA: Diagnosis present

## 2022-11-15 DIAGNOSIS — R278 Other lack of coordination: Secondary | ICD-10-CM | POA: Diagnosis not present

## 2022-11-15 DIAGNOSIS — Z7401 Bed confinement status: Secondary | ICD-10-CM | POA: Diagnosis not present

## 2022-11-15 DIAGNOSIS — F0393 Unspecified dementia, unspecified severity, with mood disturbance: Secondary | ICD-10-CM | POA: Diagnosis not present

## 2022-11-15 DIAGNOSIS — J189 Pneumonia, unspecified organism: Secondary | ICD-10-CM | POA: Diagnosis not present

## 2022-11-15 DIAGNOSIS — I10 Essential (primary) hypertension: Secondary | ICD-10-CM | POA: Diagnosis present

## 2022-11-15 DIAGNOSIS — Z91040 Latex allergy status: Secondary | ICD-10-CM

## 2022-11-15 DIAGNOSIS — F03918 Unspecified dementia, unspecified severity, with other behavioral disturbance: Secondary | ICD-10-CM | POA: Diagnosis not present

## 2022-11-15 DIAGNOSIS — Z8261 Family history of arthritis: Secondary | ICD-10-CM | POA: Diagnosis not present

## 2022-11-15 DIAGNOSIS — D472 Monoclonal gammopathy: Secondary | ICD-10-CM | POA: Diagnosis not present

## 2022-11-15 DIAGNOSIS — Z7189 Other specified counseling: Secondary | ICD-10-CM | POA: Diagnosis not present

## 2022-11-15 DIAGNOSIS — K219 Gastro-esophageal reflux disease without esophagitis: Secondary | ICD-10-CM | POA: Diagnosis present

## 2022-11-15 DIAGNOSIS — I5043 Acute on chronic combined systolic (congestive) and diastolic (congestive) heart failure: Secondary | ICD-10-CM | POA: Diagnosis not present

## 2022-11-15 DIAGNOSIS — R41841 Cognitive communication deficit: Secondary | ICD-10-CM | POA: Diagnosis not present

## 2022-11-15 DIAGNOSIS — E870 Hyperosmolality and hypernatremia: Secondary | ICD-10-CM | POA: Diagnosis not present

## 2022-11-15 DIAGNOSIS — Z79899 Other long term (current) drug therapy: Secondary | ICD-10-CM

## 2022-11-15 LAB — CBC WITH DIFFERENTIAL/PLATELET
Abs Immature Granulocytes: 0.05 10*3/uL (ref 0.00–0.07)
Basophils Absolute: 0.1 10*3/uL (ref 0.0–0.1)
Basophils Relative: 1 %
Eosinophils Absolute: 0 10*3/uL (ref 0.0–0.5)
Eosinophils Relative: 0 %
HCT: 45.1 % (ref 36.0–46.0)
Hemoglobin: 12.4 g/dL (ref 12.0–15.0)
Immature Granulocytes: 1 %
Lymphocytes Relative: 20 %
Lymphs Abs: 1.5 10*3/uL (ref 0.7–4.0)
MCH: 25.9 pg — ABNORMAL LOW (ref 26.0–34.0)
MCHC: 27.5 g/dL — ABNORMAL LOW (ref 30.0–36.0)
MCV: 94.2 fL (ref 80.0–100.0)
Monocytes Absolute: 0.8 10*3/uL (ref 0.1–1.0)
Monocytes Relative: 11 %
Neutro Abs: 5 10*3/uL (ref 1.7–7.7)
Neutrophils Relative %: 67 %
Platelets: 257 10*3/uL (ref 150–400)
RBC: 4.79 MIL/uL (ref 3.87–5.11)
RDW: 16.2 % — ABNORMAL HIGH (ref 11.5–15.5)
WBC: 7.3 10*3/uL (ref 4.0–10.5)
nRBC: 0 % (ref 0.0–0.2)

## 2022-11-15 LAB — COMPREHENSIVE METABOLIC PANEL
ALT: 12 U/L (ref 0–44)
AST: 23 U/L (ref 15–41)
Albumin: 3 g/dL — ABNORMAL LOW (ref 3.5–5.0)
Alkaline Phosphatase: 69 U/L (ref 38–126)
Anion gap: 18 — ABNORMAL HIGH (ref 5–15)
BUN: 74 mg/dL — ABNORMAL HIGH (ref 8–23)
CO2: 16 mmol/L — ABNORMAL LOW (ref 22–32)
Calcium: 11 mg/dL — ABNORMAL HIGH (ref 8.9–10.3)
Chloride: 129 mmol/L — ABNORMAL HIGH (ref 98–111)
Creatinine, Ser: 3.1 mg/dL — ABNORMAL HIGH (ref 0.44–1.00)
GFR, Estimated: 14 mL/min — ABNORMAL LOW (ref >60)
Glucose, Bld: 86 mg/dL (ref 70–99)
Potassium: 4.2 mmol/L (ref 3.5–5.1)
Sodium: 163 mmol/L (ref 135–145)
Total Bilirubin: 0.8 mg/dL (ref 0.3–1.2)
Total Protein: 8.8 g/dL — ABNORMAL HIGH (ref 6.5–8.1)

## 2022-11-15 LAB — LACTIC ACID, PLASMA: Lactic Acid, Venous: 1.8 mmol/L (ref 0.5–1.9)

## 2022-11-15 LAB — TSH: TSH: 0.746 u[IU]/mL (ref 0.350–4.500)

## 2022-11-15 LAB — MAGNESIUM: Magnesium: 2.8 mg/dL — ABNORMAL HIGH (ref 1.7–2.4)

## 2022-11-15 MED ORDER — LACTATED RINGERS IV BOLUS
1000.0000 mL | Freq: Once | INTRAVENOUS | Status: AC
  Administered 2022-11-15: 1000 mL via INTRAVENOUS

## 2022-11-15 MED ORDER — LACTATED RINGERS IV SOLN: INTRAVENOUS | Status: DC

## 2022-11-15 NOTE — ED Provider Notes (Signed)
[facility] EMERGENCY DEPARTMENT Provider Note   CSN: [id number] Arrival date & time: 11/15/22  1918     History  Chief Complaint  Patient presents with   abnormal labs     [name] is a 89 y.o. female.   Pt is an 89y/o female with hx of dementia, GERD, HTN, CKD and hospital stay at the end of December for CHF resulting in need for diuresis on Lasix and torsemide who is presenting from a skilled facility today due to 2 to 3 days of not seeming herself with less energy and more withdrawn.  Patient is also not been eating or drinking as much.  They report at baseline she does not talk much because of her dementia.  They ordered labs at the facility today and sent her to the emergency room because she was found to be hyper calcemic, hyper natremia.  Patient does not give any history.  She is sluggish on exam.  The history is provided by the EMS personnel and medical records. The history is limited by the condition of the patient.       Home Medications Prior to Admission medications   Medication Sig Start Date End Date Taking? Authorizing Provider  acetaminophen (TYLENOL) 650 MG CR tablet Take 650 mg by mouth every 8 (eight) hours as needed for pain.    [provider]  albuterol (PROAIR HFA) 108 (90 Base) MCG/ACT inhaler INHALE 2 PUFFS EVERY 4 HOURS AS NEEDED FOR WHEEZING 01/27/22   Lauree Chandler, NP  amLODipine (NORVASC) 10 MG tablet Take 1 tablet (10 mg total) by mouth daily. 10/26/22   Dessa Phi, DO  cetirizine (ZYRTEC) 10 MG tablet TAKE 1 TABLET BY MOUTH AS NEEDED FOR ALLERGIES 08/07/22   Lauree Chandler, NP  denosumab (PROLIA) 60 MG/ML SOSY injection Inject 60 mg into the skin every 6 (six) months.    [provider]  FEROSUL 325 (65 Fe) MG tablet TAKE 1 TABLET(325 MG) BY MOUTH DAILY WITH BREAKFAST Patient taking differently: Take 325 mg by mouth daily as needed (low iron). 03/15/22   Lauree Chandler, NP   fluticasone-salmeterol (ADVAIR HFA) [phone] MCG/ACT inhaler INHALE 2 PUFFS BY MOUTH TWICE DAILY 01/27/22   Lauree Chandler, NP  hydrALAZINE (APRESOLINE) 50 MG tablet TAKE 1 TABLET BY MOUTH TWICE DAILY AT 8 AM AND 10 PM 01/27/22   Lauree Chandler, NP  isosorbide mononitrate (IMDUR) 30 MG 24 hr tablet Take 1 tablet (30 mg total) by mouth daily. 10/26/22   Dessa Phi, DO  OLANZapine (ZYPREXA) 5 MG tablet TAKE 1 TABLET(5 MG) BY MOUTH AT BEDTIME 01/27/22   Lauree Chandler, NP  omeprazole (PRILOSEC) 20 MG capsule TAKE 1 CAPSULE(20 MG) BY MOUTH DAILY 10/20/22   Lauree Chandler, NP  torsemide (DEMADEX) 20 MG tablet TAKE 1/2 TABLET(10 MG) BY MOUTH DAILY 04/19/22   Lauree Chandler, NP      Allergies    Aspirin, Codeine, and Latex    Review of Systems   Review of Systems  Physical Exam Updated Vital Signs BP 99/75   Pulse 72   Temp (!) 96.9 F (36.1 C) (Axillary)   Resp 20   Ht '4\' 11"'$  (1.499 m)   Wt 66 kg   SpO2 100%   BMI 29.39 kg/m  Physical Exam Vitals and nursing note reviewed.  Constitutional:      General: She is not in acute distress.    Appearance: She is well-developed.  Comments: lethargic  HENT:     Head: Normocephalic and atraumatic.     Mouth/Throat:     Mouth: Mucous membranes are dry.  Eyes:     Pupils: Pupils are equal, round, and reactive to light.  Cardiovascular:     Rate and Rhythm: Normal rate and regular rhythm.     Heart sounds: Normal heart sounds. No murmur heard.    No friction rub.  Pulmonary:     Effort: Pulmonary effort is normal.     Breath sounds: Normal breath sounds. No wheezing or rales.  Abdominal:     General: Bowel sounds are normal. There is no distension.     Palpations: Abdomen is soft.     Tenderness: There is no abdominal tenderness. There is no guarding or rebound.  Musculoskeletal:        General: No tenderness. Normal range of motion.     Right lower leg: No edema.     Left lower leg: No edema.     Comments: No  edema  Skin:    General: Skin is warm and dry.     Findings: No rash.     Comments: Poor skin turgor  Neurological:     Mental Status: She is alert.     Comments: Not speaking but noted to move arms and legs spontaneously  Psychiatric:     Comments: cooperative     ED Results / Procedures / Treatments   Labs (all labs ordered are listed, but only abnormal results are displayed) Labs Reviewed  CBC WITH DIFFERENTIAL/PLATELET - Abnormal; Notable for the following components:      Result Value   MCH 25.9 (*)    MCHC 27.5 (*)    RDW 16.2 (*)    All other components within normal limits  COMPREHENSIVE METABOLIC PANEL - Abnormal; Notable for the following components:   Sodium 163 (*)    Chloride 129 (*)    CO2 16 (*)    BUN 74 (*)    Creatinine, Ser 3.10 (*)    Calcium 11.0 (*)    Total Protein 8.8 (*)    Albumin 3.0 (*)    GFR, Estimated 14 (*)    Anion gap 18 (*)    All other components within normal limits  MAGNESIUM - Abnormal; Notable for the following components:   Magnesium 2.8 (*)    All other components within normal limits  LACTIC ACID, PLASMA  TSH  LACTIC ACID, PLASMA  URINALYSIS, ROUTINE W REFLEX MICROSCOPIC    EKG EKG Interpretation  Date/Time:  Wednesday November 15 2022 19:39:30 EST Ventricular Rate:  80 PR Interval:    QRS Duration: 129 QT Interval:  416 QTC Calculation: 480 R Axis:   -52 Text Interpretation: Sinus rhythm RBBB and LAFB LVH with secondary repolarization abnormality Artifact in lead(s) I III aVR aVL aVF V1 V2 Confirmed by Blanchie Dessert [phone]) on 11/15/2022 9:47:01 PM  Radiology No results found.  Procedures Procedures    Medications Ordered in ED Medications  lactated ringers infusion (has no administration in time range)  lactated ringers bolus 1,000 mL (1,000 mLs Intravenous New Bag/Given 11/15/22 2143)    ED Course/ Medical Decision Making/ A&P                             Medical Decision Making Amount and/or  Complexity of Data Reviewed Independent Historian: EMS    Details: snf External Data Reviewed: notes.  Details: Recent hospitalization Labs: ordered. Decision-making details documented in ED Course. Radiology: ordered and independent interpretation performed. Decision-making details documented in ED Course. ECG/medicine tests: ordered and independent interpretation performed. Decision-making details documented in ED Course.  Risk Prescription drug management. Decision regarding hospitalization.   Pt with multiple medical problems and comorbidities and presenting today with a complaint that caries a high risk for morbidity and mortality.  Here today from a skilled facility for not acting herself over the last few days.  She has not had the same energy that she normally does.  Patient does have a history of dementia and does not talk much but also notes has not been eating and drinking well.  I independently interpreted patient's labs and EKG.  EKG contained significant artifact without acute findings today.  CBC with stable hemoglobin and white count, CMP with new hyponatremia of 163, hyperchloremia of 129, new AKI with creatinine of 3.10, uremia with a BUN of 74 and hypercalcemia of 11.  Patient's anion gap is 18 and feel it is most likely related to the uremia as she has no evidence to suggest DKA, salicylate poisoning, toxic alcohol ingestion.  TSH and lactic acid within normal limits.  Patient started on IV fluids.  Last echo was a month ago with preserved EF of 65%.  Feel that patient will require admission for further care.         Final Clinical Impression(s) / ED Diagnoses Final diagnoses:  Hypernatremia  Hypercalcemia  AKI (acute kidney injury) ([place])  Hyperchloremia    Rx / DC Orders ED Discharge Orders     None         Blanchie Dessert, MD 11/15/22 2317

## 2022-11-15 NOTE — ED Triage Notes (Signed)
Patient was bib by [facility] after Family said behavior is different, they stated its not as perky as normal the past couple days, but her behavior is about at her baseline. She had labs drawn this afternoon and her calcium and and sodium were elevated. Patient has a history of dementia and per ems she does not talk a lot. VSS and CBG 91

## 2022-11-15 NOTE — H&P (Signed)
History and Physical    [name] WCH:[id number] DOB: [birth year] DOA: 11/15/2022  PCP: Lauree Chandler, NP  Patient coming from: SNF  I have personally briefly reviewed patient's old medical records in [place]  Chief Complaint: Hypernatremia  HPI: [name] is a 89 y.o. female with medical history significant for dementia, HFpEF (EF 60-65%), CKD stage IIIb, COPD, HTN, GERD who presented to the ED from SNF for hypernatremia.  History is limited from patient due to dementia and is otherwise supplemented by EDP, chart review, and nephew/legal guardian by phone.  Patient currently resides at [facility].  Her nephew has noted that over the last few days she has been declining and not as interactive as she usually is.  She does not speak much at baseline.  She has not been eating much over this time.  Labs were obtained at her SNF which showed sodium was elevated in the 160s.  She was sent to the ED for further evaluation.  ED Course  Labs/Imaging on admission: I have personally reviewed following labs and imaging studies.  Initial vitals showed BP 122/77, pulse 77, RR 22, temp 96.9 F, SpO2 99% on room air.  Labs show WBC 7.3, hemoglobin 12.4, platelets 257,000, sodium 163, potassium 4.2, chloride 129, bicarb 16, BUN 74, creatinine 3.10 (baseline 1.5-1.6), serum glucose 86, calcium 11.0, albumin 3.0, LFTs within normal limits, lactic acid 1.8, magnesium 2.8, TSH 0.746.  Patient was given 1 L LR and the hospitalist service was consulted to admit for further evaluation and management.  Review of Systems:  Unable to obtain full review of systems due to dementia.   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/01/3613   Systolic heart failure ([place])    EF 35 to 40% per echo January 2015   Unspecified essential hypertension    Unspecified nonpsychotic mental disorder    Unspecified venous (peripheral) insufficiency    Unspecified venous (peripheral) insufficiency    Unspecified vitamin D deficiency     Past Surgical History:  Procedure Laterality Date   APPENDECTOMY     CERVICAL SPINE SURGERY     x 2   CHOLECYSTECTOMY     ESOPHAGOGASTRODUODENOSCOPY N/A 04/05/2015   Procedure: ESOPHAGOGASTRODUODENOSCOPY (EGD);  Surgeon: [name], MD;  Location: [facility];  Service: Endoscopy;  Laterality: N/A;   FLEXIBLE SIGMOIDOSCOPY N/A 04/05/2015   Procedure: FLEXIBLE SIGMOIDOSCOPY;  Surgeon: [name], MD;  Location: [facility] END[facility];  Service: Endoscopy;  Laterality: N/A;   HAND SURGERY     LUMBAR DISC SURGERY     PARTIAL HYSTERECTOMY     TONSILLECTOMY      Social History:  reports that she has never smoked. She has never used smokeless tobacco. She reports that she does not drink alcohol and does not use drugs.  Allergies  Allergen Reactions   Aspirin Other (See Comments)    REACTION: upset stomach   Codeine Other (See Comments)    REACTION: "MAKES MY BODY GO CRAZY; CRAMPS"  Not listed on the [facility]   Latex Nausea And Vomiting  Family History  Problem Relation Age of Onset   Diabetes Mother    Arthritis Father    Diabetes Paternal Grandfather        entire family on both sides   Heart disease Maternal Aunt        entire family of both sides   Cancer Paternal Aunt        type unknown   Diabetes Sister    Breast cancer Cousin      Prior to Admission medications   Medication Sig Start Date End Date Taking? Authorizing Provider  acetaminophen (TYLENOL) 650 MG CR tablet Take 650 mg by mouth every 8 (eight) hours as needed for pain.    [provider]  albuterol (PROAIR HFA) 108 (90 Base) MCG/ACT  inhaler INHALE 2 PUFFS EVERY 4 HOURS AS NEEDED FOR WHEEZING 01/27/22   Lauree Chandler, NP  amLODipine (NORVASC) 10 MG tablet Take 1 tablet (10 mg total) by mouth daily. 10/26/22   Dessa Phi, DO  cetirizine (ZYRTEC) 10 MG tablet TAKE 1 TABLET BY MOUTH AS NEEDED FOR ALLERGIES 08/07/22   Lauree Chandler, NP  denosumab (PROLIA) 60 MG/ML SOSY injection Inject 60 mg into the skin every 6 (six) months.    [provider]  FEROSUL 325 (65 Fe) MG tablet TAKE 1 TABLET(325 MG) BY MOUTH DAILY WITH BREAKFAST Patient taking differently: Take 325 mg by mouth daily as needed (low iron). 03/15/22   Lauree Chandler, NP  fluticasone-salmeterol (ADVAIR HFA) [phone] MCG/ACT inhaler INHALE 2 PUFFS BY MOUTH TWICE DAILY 01/27/22   Lauree Chandler, NP  hydrALAZINE (APRESOLINE) 50 MG tablet TAKE 1 TABLET BY MOUTH TWICE DAILY AT 8 AM AND 10 PM 01/27/22   Lauree Chandler, NP  isosorbide mononitrate (IMDUR) 30 MG 24 hr tablet Take 1 tablet (30 mg total) by mouth daily. 10/26/22   Dessa Phi, DO  OLANZapine (ZYPREXA) 5 MG tablet TAKE 1 TABLET(5 MG) BY MOUTH AT BEDTIME 01/27/22   Lauree Chandler, NP  omeprazole (PRILOSEC) 20 MG capsule TAKE 1 CAPSULE(20 MG) BY MOUTH DAILY 10/20/22   Lauree Chandler, NP  torsemide (DEMADEX) 20 MG tablet TAKE 1/2 TABLET(10 MG) BY MOUTH DAILY 04/19/22   Lauree Chandler, NP    Physical Exam: Vitals:   11/15/22 2230 11/15/22 2300 11/15/22 2330 11/15/22 2342  BP: (!) 152/98 (!) 148/60 (!) 168/79   Pulse: 74 78 79   Resp: (!) '23 20 20   '$ Temp:    97.6 F (36.4 C)  TempSrc:    Axillary  SpO2: 100% 97% 100%   Weight:      Height:       Constitutional: Elderly woman resting in bed, somnolent Eyes: EOMI, lids and conjunctivae normal ENMT: Mucous membranes are dry. Posterior pharynx clear of any exudate or lesions.Normal dentition.  Neck: normal, supple, no masses. Respiratory: clear to auscultation anteriorly. Normal respiratory effort. No accessory muscle  use.  Cardiovascular: Regular rate and rhythm, no murmurs / rubs / gallops. No extremity edema. 2+ pedal pulses. Abdomen: no tenderness, no masses palpated. Musculoskeletal: no clubbing / cyanosis. No joint deformity upper and lower extremities. Good ROM, no contractures. Normal muscle tone.  Skin: no rashes, lesions, ulcers. No induration Neurologic:  Sensation appears intact.  Moves extremities spontaneously Psychiatric: Somnolent, not speaking or following commands  EKG: Personally reviewed. Sinus rhythm, rate 80, RBBB and LAFB, motion artifact.  Assessment/Plan Principal Problem:   Hypernatremia Active Problems:   Acute renal failure superimposed on stage  3b chronic kidney disease (HCC)   Hypercalcemia   (HFpEF) heart failure with preserved ejection fraction (HCC)   Essential hypertension   COPD (chronic obstructive pulmonary disease) (HCC)   Dementia with behavioral disturbance (HCC)   [name] is a 89 y.o. female with medical history significant for dementia, HFpEF (EF 60-65%), CKD stage IIIb, COPD, HTN, GERD who is admitted with hypernatremia due to volume depletion associated with AKI on CKD stage IIIb.  Assessment and Plan: * Hypernatremia Secondary to volume depletion/dehydration.  Sodium 163 on admission. -Start IV fluids with D5W @ 100 mL/hour overnight -Repeat labs in a.m.  Acute renal failure superimposed on stage 3b chronic kidney disease ([place]) Secondary to volume depletion.  Creatinine 3.10 compared to baseline 1.5-1.6. -Continue IV fluid hydration overnight -Monitor UOP and repeat BMP in a.m.  Hypercalcemia Corrected calcium 11.8 in setting of volume depletion.  Continue IV fluids and monitor.  (HFpEF) heart failure with preserved ejection fraction (HCC) Hold torsemide as she is severely volume depleted.  Last EF 60-65% 10/20/2022.  Dementia with behavioral disturbance (HCC) Continue home Zyprexa 5 mg qhs.  Delirium precautions.  COPD (chronic  obstructive pulmonary disease) (HCC) Stable.  Continue Dulera and albuterol as needed.  Essential hypertension Hold antihypertensives for now.  DVT prophylaxis: heparin injection 5,000 Units Start: 11/16/22 0600 Code Status: Full code, discussed with nephew/legal guardian Alan [place] by phone on admission Family Communication: Discussed with nephew by phone Disposition Plan: From SNF, dispo pending clinical progress Consults called: None Severity of Illness: The appropriate patient status for this patient is INPATIENT. Inpatient status is judged to be reasonable and necessary in order to provide the required intensity of service to ensure the patient's safety. The patient's presenting symptoms, physical exam findings, and initial radiographic and laboratory data in the context of their chronic comorbidities is felt to place them at high risk for further clinical deterioration. Furthermore, it is not anticipated that the patient will be medically stable for discharge from the hospital within 2 midnights of admission.   * I certify that at the point of admission it is my clinical judgment that the patient will require inpatient hospital care spanning beyond 2 midnights from the point of admission due to high intensity of service, high risk for further deterioration and high frequency of surveillance required.Zada Finders MD [facility]  If 7PM-7AM, please contact night-coverage [email or web address]  11/16/2022, 12:37 AM

## 2022-11-16 DIAGNOSIS — I5030 Unspecified diastolic (congestive) heart failure: Secondary | ICD-10-CM | POA: Diagnosis present

## 2022-11-16 DIAGNOSIS — N1832 Chronic kidney disease, stage 3b: Secondary | ICD-10-CM | POA: Diagnosis present

## 2022-11-16 DIAGNOSIS — E870 Hyperosmolality and hypernatremia: Secondary | ICD-10-CM | POA: Diagnosis not present

## 2022-11-16 DIAGNOSIS — N179 Acute kidney failure, unspecified: Secondary | ICD-10-CM | POA: Diagnosis present

## 2022-11-16 LAB — CBC
HCT: 38.1 % (ref 36.0–46.0)
Hemoglobin: 10.9 g/dL — ABNORMAL LOW (ref 12.0–15.0)
MCH: 26.1 pg (ref 26.0–34.0)
MCHC: 28.6 g/dL — ABNORMAL LOW (ref 30.0–36.0)
MCV: 91.1 fL (ref 80.0–100.0)
Platelets: 221 10*3/uL (ref 150–400)
RBC: 4.18 MIL/uL (ref 3.87–5.11)
RDW: 16.2 % — ABNORMAL HIGH (ref 11.5–15.5)
WBC: 6.2 10*3/uL (ref 4.0–10.5)
nRBC: 0 % (ref 0.0–0.2)

## 2022-11-16 LAB — COMPREHENSIVE METABOLIC PANEL
ALT: 10 U/L (ref 0–44)
AST: 23 U/L (ref 15–41)
Albumin: 2.6 g/dL — ABNORMAL LOW (ref 3.5–5.0)
Alkaline Phosphatase: 61 U/L (ref 38–126)
Anion gap: 12 (ref 5–15)
BUN: 67 mg/dL — ABNORMAL HIGH (ref 8–23)
CO2: 24 mmol/L (ref 22–32)
Calcium: 10.6 mg/dL — ABNORMAL HIGH (ref 8.9–10.3)
Chloride: 123 mmol/L — ABNORMAL HIGH (ref 98–111)
Creatinine, Ser: 2.59 mg/dL — ABNORMAL HIGH (ref 0.44–1.00)
GFR, Estimated: 17 mL/min — ABNORMAL LOW (ref >60)
Glucose, Bld: 159 mg/dL — ABNORMAL HIGH (ref 70–99)
Potassium: 3.8 mmol/L (ref 3.5–5.1)
Sodium: 159 mmol/L — ABNORMAL HIGH (ref 135–145)
Total Bilirubin: 0.6 mg/dL (ref 0.3–1.2)
Total Protein: 7.4 g/dL (ref 6.5–8.1)

## 2022-11-16 LAB — BASIC METABOLIC PANEL
Anion gap: 8 (ref 5–15)
Anion gap: 8 (ref 5–15)
BUN: 64 mg/dL — ABNORMAL HIGH (ref 8–23)
BUN: 61 mg/dL — ABNORMAL HIGH (ref 8–23)
CO2: 26 mmol/L (ref 22–32)
CO2: 25 mmol/L (ref 22–32)
Calcium: 10.2 mg/dL (ref 8.9–10.3)
Calcium: 10.3 mg/dL (ref 8.9–10.3)
Chloride: 122 mmol/L — ABNORMAL HIGH (ref 98–111)
Chloride: 119 mmol/L — ABNORMAL HIGH (ref 98–111)
Creatinine, Ser: 2.5 mg/dL — ABNORMAL HIGH (ref 0.44–1.00)
Creatinine, Ser: 2.51 mg/dL — ABNORMAL HIGH (ref 0.44–1.00)
GFR, Estimated: 18 mL/min — ABNORMAL LOW (ref >60)
GFR, Estimated: 18 mL/min — ABNORMAL LOW (ref >60)
Glucose, Bld: 206 mg/dL — ABNORMAL HIGH (ref 70–99)
Glucose, Bld: 164 mg/dL — ABNORMAL HIGH (ref 70–99)
Potassium: 3.3 mmol/L — ABNORMAL LOW (ref 3.5–5.1)
Potassium: 3.6 mmol/L (ref 3.5–5.1)
Sodium: 156 mmol/L — ABNORMAL HIGH (ref 135–145)
Sodium: 152 mmol/L — ABNORMAL HIGH (ref 135–145)

## 2022-11-16 LAB — CBG MONITORING, ED: Glucose-Capillary: 177 mg/dL — ABNORMAL HIGH (ref 70–99)

## 2022-11-16 LAB — LACTIC ACID, PLASMA: Lactic Acid, Venous: 1.8 mmol/L (ref 0.5–1.9)

## 2022-11-16 MED ORDER — OLANZAPINE 5 MG PO TABS
5.0000 mg | ORAL_TABLET | Freq: Every day | ORAL | Status: DC
  Administered 2022-11-16 – 2022-11-22 (×7): 5 mg via ORAL
  Filled 2022-11-16 (×8): qty 1

## 2022-11-16 MED ORDER — ACETAMINOPHEN 325 MG PO TABS
650.0000 mg | ORAL_TABLET | Freq: Four times a day (QID) | ORAL | Status: DC | PRN
  Administered 2022-11-20: 650 mg via ORAL
  Filled 2022-11-16: qty 2

## 2022-11-16 MED ORDER — SODIUM CHLORIDE 0.9% FLUSH
3.0000 mL | Freq: Two times a day (BID) | INTRAVENOUS | Status: DC
  Administered 2022-11-16 – 2022-11-18 (×3): 3 mL via INTRAVENOUS

## 2022-11-16 MED ORDER — DEXTROSE 5 % IV SOLN: INTRAVENOUS | Status: AC

## 2022-11-16 MED ORDER — ALBUTEROL SULFATE (2.5 MG/3ML) 0.083% IN NEBU: 3.0000 mL | INHALATION_SOLUTION | RESPIRATORY_TRACT | Status: DC | PRN

## 2022-11-16 MED ORDER — HEPARIN SODIUM (PORCINE) 5000 UNIT/ML IJ SOLN
5000.0000 [IU] | Freq: Three times a day (TID) | INTRAMUSCULAR | Status: DC
  Administered 2022-11-16 – 2022-11-22 (×19): 5000 [IU] via SUBCUTANEOUS
  Filled 2022-11-16 (×18): qty 1

## 2022-11-16 MED ORDER — PANTOPRAZOLE SODIUM 40 MG PO TBEC
40.0000 mg | DELAYED_RELEASE_TABLET | Freq: Every day | ORAL | Status: DC
  Administered 2022-11-17 – 2022-11-22 (×6): 40 mg via ORAL
  Filled 2022-11-16 (×7): qty 1

## 2022-11-16 MED ORDER — ACETAMINOPHEN 650 MG RE SUPP: 650.0000 mg | Freq: Four times a day (QID) | RECTAL | Status: DC | PRN

## 2022-11-16 MED ORDER — SENNOSIDES-DOCUSATE SODIUM 8.6-50 MG PO TABS: 1.0000 | ORAL_TABLET | Freq: Every evening | ORAL | Status: DC | PRN

## 2022-11-16 MED ORDER — MOMETASONE FURO-FORMOTEROL FUM 200-5 MCG/ACT IN AERO
2.0000 | INHALATION_SPRAY | Freq: Two times a day (BID) | RESPIRATORY_TRACT | Status: DC
  Administered 2022-11-18 – 2022-11-23 (×8): 2 via RESPIRATORY_TRACT
  Filled 2022-11-16 (×2): qty 8.8

## 2022-11-16 MED ORDER — ONDANSETRON HCL 4 MG/2ML IJ SOLN: 4.0000 mg | Freq: Four times a day (QID) | INTRAMUSCULAR | Status: DC | PRN

## 2022-11-16 MED ORDER — ONDANSETRON HCL 4 MG PO TABS: 4.0000 mg | ORAL_TABLET | Freq: Four times a day (QID) | ORAL | Status: DC | PRN

## 2022-11-16 NOTE — Assessment & Plan Note (Signed)
Hold torsemide as she is severely volume depleted.  Last EF 60-65% 10/20/2022.

## 2022-11-16 NOTE — Assessment & Plan Note (Signed)
Hold antihypertensives for now.

## 2022-11-16 NOTE — ED Notes (Addendum)
Pt's brief changed, new brief placed and pt provided with warm blankets. Pt aggressive at this time unable to obtain a temperature

## 2022-11-16 NOTE — Assessment & Plan Note (Signed)
Stable.  Continue Dulera and albuterol as needed.

## 2022-11-16 NOTE — Assessment & Plan Note (Signed)
Continue home Zyprexa 5 mg qhs.  Delirium precautions.

## 2022-11-16 NOTE — Assessment & Plan Note (Signed)
Secondary to volume depletion/dehydration.  Sodium 163 on admission. -Start IV fluids with D5W @ 100 mL/hour overnight -Repeat labs in a.m.

## 2022-11-16 NOTE — ED Notes (Signed)
Bear hugger removed from Pt.

## 2022-11-16 NOTE — Assessment & Plan Note (Signed)
Secondary to volume depletion.  Creatinine 3.10 compared to baseline 1.5-1.6. -Continue IV fluid hydration overnight -Monitor UOP and repeat BMP in a.m.

## 2022-11-16 NOTE — ED Notes (Signed)
Admitting notified that the pt is unable to follow commands enough to drink any liquids as well as her temp decreasing to 98.5. Bair hugger applied and CBG checked CBG 177

## 2022-11-16 NOTE — Progress Notes (Signed)
PROGRESS NOTE    [name]  KGY:[id number] DOB: 08-03-1932 DOA: 11/15/2022 PCP: Lauree Chandler, NP    Brief Narrative:  89 year old with history of dementia, chronic diastolic heart failure, CKD stage IIIb, COPD, hypertension and GERD, long-term nursing home resident sent to the ER with hypernatremia.  Patient was not able to give any history.  She has been less interactive since last few days.  Sodium was 160 at the nursing home so sent to ER.   Assessment & Plan:   Hyperchloremic hypernatremia, severe dehydration due to inadequate water intake Acute kidney injury with underlying chronic kidney disease stage IIIb: Baseline creatinine 1.6.  Presented with sodium 163, chloride 129, creatinine 3.1.  Patient with no adequate free water intake. Isotonic fluid 1 L given.  Currently on dextrose infusion.  Will continue. Close monitoring, will avoid sudden correction. Urine output is adequate.  Renal function slightly trending down to her usual levels.  Hypercalcemia secondary to dehydration: IV fluids.  Improving.  Chronic diastolic heart failure: She will dehydrated.  Discontinue diuretics.  Dementia with behavioral disturbances: Zyprexa 5 mg at night as at nursing home.  Delirium precautions.  Fall precautions.  COPD: Stable.   Interdisciplinary Goals of Care Family Meeting   Date carried out: 11/16/2022  Location of the meeting: Phone conference  Member's involved: Physician and Family Member or next of kin  [place] of Attorney or acting medical decision maker: Alan [place].  Durable power of attorney  Discussion: We discussed goals of care for [name] .  Now with severe hyponatremia and hypercalcemia in the context of advancing dementia and inadequate oral intake.  Clinical status pointing towards end-of-life. Discussed about CODE STATUS.  Discussed about poor outcome if patient has to undergo chest compressions or mechanical ventilation.  POA agreed  for no CPR with limited intervention for now. POA is agreeable to meet with palliative care and discuss about possible hospice options at the nursing home. Will continue current level of care for today.  Code status:   Code Status: DNR   Disposition: Continue current acute care  Time spent for the meeting: Additional 10 minutes.    DVT prophylaxis: heparin injection 5,000 Units Start: 11/16/22 0600   Code Status: Full code, changing to DNR. Family Communication: Nephew Mr. [place] on the phone.  Called and updated.  Changed to DNR. Disposition Plan: Status is: Inpatient Remains inpatient appropriate because: Severe hyponatremia     Consultants:  Palliative care  Procedures:  None  Antimicrobials:  None   Subjective: Patient seen and examined.  Lethargic and tired.  Does not participate in conversation.  Nursing staff could not keep her awake tube feed.  Objective: Vitals:   11/16/22 0830 11/16/22 0845 11/16/22 1045 11/16/22 1055  BP: (!) 168/75 (!) 154/62 (!) 167/61   Pulse: 67 64 65   Resp: '15 17 14   '$ Temp:    (!) 95.8 F (35.4 C)  TempSrc:    Rectal  SpO2: 98% 98% 99%   Weight:      Height:        Intake/Output Summary (Last 24 hours) at 11/16/2022 1153 Last data filed at 11/16/2022 0105 Gross per 24 hour  Intake 1168.79 ml  Output --  Net 1168.79 ml   Filed Weights   11/15/22 1933  Weight: 66 kg    Examination:  General exam: Appears lethargic.  Frail and debilitated. Alert and stimulation.  Responds to some basic questions then does not interact. Dry mucous  membranes. Respiratory system: Clear to auscultation. Respiratory effort normal.  No added sounds. Cardiovascular system: S1 & S2 heard, RRR.  Gastrointestinal system: Soft.  Nontender.   Central nervous system: Sleepy.  Lethargic.  Moves all extremities.  Gets uncomfortable on stimulation.    Data Reviewed: I have personally reviewed following labs and imaging studies  CBC: Recent Labs   Lab 11/15/22 2138 11/16/22 0520  WBC 7.3 6.2  NEUTROABS 5.0  --   HGB 12.4 10.9*  HCT 45.1 38.1  MCV 94.2 91.1  PLT 257 224   Basic Metabolic Panel: Recent Labs  Lab 11/15/22 2138 11/16/22 0520  NA 163* 159*  K 4.2 3.8  CL 129* 123*  CO2 16* 24  GLUCOSE 86 159*  BUN 74* 67*  CREATININE 3.10* 2.59*  CALCIUM 11.0* 10.6*  MG 2.8*  --    GFR: Estimated Creatinine Clearance: 12.4 mL/min (A) (by C-G formula based on SCr of 2.59 mg/dL (H)). Liver Function Tests: Recent Labs  Lab 11/15/22 2138 11/16/22 0520  AST 23 23  ALT 12 10  ALKPHOS 69 61  BILITOT 0.8 0.6  PROT 8.8* 7.4  ALBUMIN 3.0* 2.6*   No results for input(s): "LIPASE", "AMYLASE" in the last 168 hours. No results for input(s): "AMMONIA" in the last 168 hours. Coagulation Profile: No results for input(s): "INR", "PROTIME" in the last 168 hours. Cardiac Enzymes: No results for input(s): "CKTOTAL", "CKMB", "CKMBINDEX", "TROPONINI" in the last 168 hours. BNP (last 3 results) No results for input(s): "PROBNP" in the last 8760 hours. HbA1C: No results for input(s): "HGBA1C" in the last 72 hours. CBG: Recent Labs  Lab 11/16/22 1053  GLUCAP 177*   Lipid Profile: No results for input(s): "CHOL", "HDL", "LDLCALC", "TRIG", "CHOLHDL", "LDLDIRECT" in the last 72 hours. Thyroid Function Tests: Recent Labs    11/15/22 2138  TSH 0.746   Anemia Panel: No results for input(s): "VITAMINB12", "FOLATE", "FERRITIN", "TIBC", "IRON", "RETICCTPCT" in the last 72 hours. Sepsis Labs: Recent Labs  Lab 11/15/22 2138  LATICACIDVEN 1.8    No results found for this or any previous visit (from the past 240 hour(s)).       Radiology Studies: No results found.      Scheduled Meds:  heparin  5,000 Units Subcutaneous Q8H   mometasone-formoterol  2 puff Inhalation BID   OLANZapine  5 mg Oral QHS   pantoprazole  40 mg Oral Daily   sodium chloride flush  3 mL Intravenous Q12H   Continuous Infusions:  dextrose  100 mL/hr at 11/16/22 0104     LOS: 1 day    Time spent: 59 minutes     [name] Merino, MD [facility] Pager [phone]

## 2022-11-16 NOTE — Assessment & Plan Note (Signed)
Corrected calcium 11.8 in setting of volume depletion.  Continue IV fluids and monitor.

## 2022-11-16 NOTE — Hospital Course (Signed)
[name] is a 89 y.o. female with medical history significant for dementia, HFpEF (EF 60-65%), CKD stage IIIb, COPD, HTN, GERD who is admitted with hypernatremia due to volume depletion associated with AKI on CKD stage IIIb.

## 2022-11-16 NOTE — ED Notes (Signed)
ED TO INPATIENT HANDOFF REPORT  ED Nurse Name and Phone #: Schelly Chuba [phone]  S Name/Age/Gender [name] 89 y.o. female Room/Bed: 011C/011C  Code Status   Code Status: DNR  Home/SNF/Other Home Disoriented x4 Is this baseline? Yes   Triage Complete: Triage complete  Chief Complaint Hypernatremia [E87.0]  Triage Note Patient was bib by [facility] after Family said behavior is different, they stated its not as perky as normal the past couple days, but her behavior is about at her baseline. She had labs drawn this afternoon and her calcium and and sodium were elevated. Patient has a history of dementia and per ems she does not talk a lot. VSS and CBG 91    Allergies Allergies  Allergen Reactions   Aspirin Other (See Comments)    REACTION: upset stomach   Codeine Other (See Comments)    REACTION: "MAKES MY BODY GO CRAZY; CRAMPS"  Not listed on the [facility]   Latex Nausea And Vomiting    Level of Care/Admitting Diagnosis ED Disposition     ED Disposition  Admit   Condition  --   [place]: [place] [[id number]]  Level of Care: Progressive [102]  Admit to Progressive based on following criteria: MULTISYSTEM THREATS such as stable sepsis, metabolic/electrolyte imbalance with or without encephalopathy that is responding to early treatment.  May admit patient to [facility] or [facility] if equivalent level of care is available:: No  Covid Evaluation: Asymptomatic - no recent exposure (last 10 days) testing not required  Diagnosis: Hypernatremia [277824]  Admitting Physician: Lenore Cordia [[id number]]  Attending Physician: Lenore Cordia [[id number]]  Certification:: I certify this patient will need inpatient services for at least 2 midnights  Estimated Length of Stay: 3          B Medical/Surgery History Past Medical History:  Diagnosis Date   Anemia, unspecified    Anxiety    Arthritis    Asthma    Delusions ([place])    Depression     Edema 2005   "since put the glass in them"    GERD (gastroesophageal reflux disease)    Lumbago    Lymphedema    Obstructive chronic bronchitis without exacerbation    Other abnormal glucose    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[place]) 06/04/7618   Systolic heart failure ([place])    EF 35 to 40% per echo January 2015   Unspecified essential hypertension    Unspecified nonpsychotic mental disorder    Unspecified venous (peripheral) insufficiency    Unspecified venous (peripheral) insufficiency    Unspecified vitamin D deficiency    Past Surgical History:  Procedure Laterality Date   APPENDECTOMY     CERVICAL SPINE SURGERY     x 2   CHOLECYSTECTOMY     ESOPHAGOGASTRODUODENOSCOPY N/A 04/05/2015   Procedure: ESOPHAGOGASTRODUODENOSCOPY (EGD);  Surgeon: [name], MD;  Location: [facility];  Service: Endoscopy;  Laterality: N/A;   FLEXIBLE SIGMOIDOSCOPY N/A 04/05/2015   Procedure: FLEXIBLE SIGMOIDOSCOPY;  Surgeon: [name], MD;  Location: [facility] ENDOSCOPY;  Service: Endoscopy;  Laterality: N/A;   HAND SURGERY     LUMBAR [place] SURGERY     PARTIAL HYSTERECTOMY     TONSILLECTOMY       A IV Location/Drains/Wounds Patient Lines/Drains/Airways Status     Active Line/Drains/Airways     Name Placement date Placement time Site Days   Peripheral IV 11/15/22 22 G Posterior;Right Hand 11/15/22  2134  Hand  1  Wound / Incision (Open or Dehisced) 10/20/22 Skin tear Buttocks Right 10/20/22  1343  Buttocks  27            Intake/Output Last 24 hours  Intake/Output Summary (Last 24 hours) at 11/16/2022 1733 Last data filed at 11/16/2022 0105 Gross per 24 hour  Intake 1168.79 ml  Output --  Net 1168.79 ml    Labs/Imaging Results for orders placed or performed during the hospital encounter of 11/15/22 (from the past 48 hour(s))  CBC with Differential/Platelet     Status: Abnormal   Collection Time: 11/15/22  9:38 PM  Result Value Ref Range   WBC 7.3 4.0 - 10.5 K/uL   RBC 4.79 3.87 - 5.11 MIL/uL    Hemoglobin 12.4 12.0 - 15.0 g/dL   HCT 45.1 36.0 - 46.0 %   MCV 94.2 80.0 - 100.0 fL   MCH 25.9 (L) 26.0 - 34.0 pg   MCHC 27.5 (L) 30.0 - 36.0 g/dL   RDW 16.2 (H) 11.5 - 15.5 %   Platelets 257 150 - 400 K/uL   nRBC 0.0 0.0 - 0.2 %   Neutrophils Relative % 67 %   Neutro Abs 5.0 1.7 - 7.7 K/uL   Lymphocytes Relative 20 %   Lymphs Abs 1.5 0.7 - 4.0 K/uL   Monocytes Relative 11 %   Monocytes Absolute 0.8 0.1 - 1.0 K/uL   Eosinophils Relative 0 %   Eosinophils Absolute 0.0 0.0 - 0.5 K/uL   Basophils Relative 1 %   Basophils Absolute 0.1 0.0 - 0.1 K/uL   Immature Granulocytes 1 %   Abs Immature Granulocytes 0.05 0.00 - 0.07 K/uL    Comment: Performed at [facility], [address], [place], [place] [address]  Comprehensive metabolic panel     Status: Abnormal   Collection Time: 11/15/22  9:38 PM  Result Value Ref Range   Sodium 163 (HH) 135 - 145 mmol/L    Comment: CRITICAL RESULT CALLED TO, READ BACK BY AND VERIFIED WITH KRYSTAL SWORD RN 11/15/22 2248 M KOROLESKI   Potassium 4.2 3.5 - 5.1 mmol/L   Chloride 129 (H) 98 - 111 mmol/L   CO2 16 (L) 22 - 32 mmol/L   Glucose, Bld 86 70 - 99 mg/dL    Comment: Glucose reference range applies only to samples taken after fasting for at least 8 hours.   BUN 74 (H) 8 - 23 mg/dL   Creatinine, Ser 3.10 (H) 0.44 - 1.00 mg/dL   Calcium 11.0 (H) 8.9 - 10.3 mg/dL   Total Protein 8.8 (H) 6.5 - 8.1 g/dL   Albumin 3.0 (L) 3.5 - 5.0 g/dL   AST 23 15 - 41 U/L   ALT 12 0 - 44 U/L   Alkaline Phosphatase 69 38 - 126 U/L   Total Bilirubin 0.8 0.3 - 1.2 mg/dL   GFR, Estimated 14 (L) >60 mL/min    Comment: (NOTE) Calculated using the CKD-EPI Creatinine Equation (2021)    Anion gap 18 (H) 5 - 15    Comment: Performed at [facility], [place] [address] [place], [place] [address]  Lactic acid, plasma     Status: None   Collection Time: 11/15/22  9:38 PM  Result Value Ref Range   Lactic Acid, Venous 1.8 0.5 - 1.9 mmol/L    Comment: Performed  at [facility] [address] [place], [place] [address]  Magnesium     Status: Abnormal   Collection Time: 11/15/22  9:38 PM  Result Value Ref Range   Magnesium 2.8 (H) 1.7 - 2.4 mg/dL    Comment: Performed at [facility] [address] [place], [place] [address]  TSH     Status: None   Collection Time: 11/15/22  9:38 PM  Result Value Ref Range   TSH 0.746 0.350 - 4.500 uIU/mL    Comment: Performed by a 3rd Generation assay with a functional sensitivity of <=0.01 uIU/mL. Performed at [facility], [place] [address], [place], [place] [address]   CBC     Status: Abnormal   Collection Time: 11/16/22  5:20 AM  Result Value Ref Range   WBC 6.2 4.0 - 10.5 K/uL   RBC 4.18 3.87 - 5.11 MIL/uL   Hemoglobin 10.9 (L) 12.0 - 15.0 g/dL   HCT 38.1 36.0 - 46.0 %   MCV 91.1 80.0 - 100.0 fL   MCH 26.1 26.0 - 34.0 pg   MCHC 28.6 (L) 30.0 - 36.0 g/dL   RDW 16.2 (H) 11.5 - 15.5 %   Platelets 221 150 - 400 K/uL   nRBC 0.0 0.0 - 0.2 %    Comment: Performed at [facility], [place] [address] [place], [place] [address]  Comprehensive metabolic panel     Status: Abnormal   Collection Time: 11/16/22  5:20 AM  Result Value Ref Range   Sodium 159 (H) 135 - 145 mmol/L   Potassium 3.8 3.5 - 5.1 mmol/L   Chloride 123 (H) 98 - 111 mmol/L   CO2 24 22 - 32 mmol/L   Glucose, Bld 159 (H) 70 - 99 mg/dL    Comment: Glucose reference range applies only to samples taken after fasting for at least 8 hours.   BUN 67 (H) 8 - 23 mg/dL   Creatinine, Ser 2.59 (H) 0.44 - 1.00 mg/dL   Calcium 10.6 (H) 8.9 - 10.3 mg/dL   Total Protein 7.4 6.5 - 8.1 g/dL   Albumin 2.6 (L) 3.5 - 5.0 g/dL   AST 23 15 - 41 U/L   ALT 10 0 - 44 U/L   Alkaline Phosphatase 61 38 - 126 U/L   Total Bilirubin 0.6 0.3 - 1.2 mg/dL   GFR, Estimated 17 (L) >60 mL/min    Comment: (NOTE) Calculated using the CKD-EPI Creatinine Equation (2021)    Anion gap 12 5 - 15    Comment: Performed at [facility], [place]  [name] [place], [place] [address]  CBG monitoring, ED     Status: Abnormal   Collection Time: 11/16/22 10:53 AM  Result Value Ref Range   Glucose-Capillary 177 (H) 70 - 99 mg/dL    Comment: Glucose reference range applies only to samples taken after fasting for at least 8 hours.  Basic metabolic panel     Status: Abnormal   Collection Time: 11/16/22 12:26 PM  Result Value Ref Range   Sodium 156 (H) 135 - 145 mmol/L   Potassium 3.3 (L) 3.5 - 5.1 mmol/L   Chloride 122 (H) 98 - 111 mmol/L   CO2 26 22 - 32 mmol/L   Glucose, Bld 206 (H) 70 - 99 mg/dL    Comment: Glucose reference range applies only to samples taken after fasting for at least 8 hours.   BUN 64 (H) 8 - 23 mg/dL   Creatinine, Ser 2.50 (H) 0.44 - 1.00 mg/dL   Calcium 10.2 8.9 - 10.3 mg/dL   GFR, Estimated 18 (L) >60 mL/min  Comment: (NOTE) Calculated using the CKD-EPI Creatinine Equation (2021)    Anion gap 8 5 - 15    Comment: Performed at [facility], [place] [address], [place], [place] [address]   No results found.  Pending Labs Unresulted Labs (From admission, onward)     Start     Ordered   11/16/22 9030  Basic metabolic panel  Now then every 8 hours,   R (with TIMED occurrences)      11/16/22 1143   11/15/22 2036  Lactic acid, plasma  Now then every 2 hours,   R (with STAT occurrences)      11/15/22 2035   11/15/22 2036  Urinalysis, Routine w reflex microscopic  Once,   URGENT        11/15/22 2035            Vitals/Pain Today's Vitals   11/16/22 1200 11/16/22 1500 11/16/22 1612 11/16/22 1700  BP: 109/61 117/63  133/67  Pulse: 62 64  66  Resp: (!) 21 (!) 23  (!) 22  Temp:   98.8 F (37.1 C) 98.9 F (37.2 C)  TempSrc:   Oral Rectal  SpO2: 97% 97%  97%  Weight:      Height:      PainSc:        Isolation Precautions No active isolations  Medications Medications  heparin injection 5,000 Units (5,000 Units Subcutaneous Given 11/16/22 1411)  sodium chloride flush (NS) 0.9 % injection 3 mL (3  mLs Intravenous Not Given 11/16/22 0949)  acetaminophen (TYLENOL) tablet 650 mg (has no administration in time range)    Or  acetaminophen (TYLENOL) suppository 650 mg (has no administration in time range)  dextrose 5 % solution ( Intravenous New Bag/Given 11/16/22 0104)  ondansetron (ZOFRAN) tablet 4 mg (has no administration in time range)    Or  ondansetron (ZOFRAN) injection 4 mg (has no administration in time range)  senna-docusate (Senokot-S) tablet 1 tablet (has no administration in time range)  albuterol (PROVENTIL) (2.5 MG/3ML) 0.083% nebulizer solution 3 mL (has no administration in time range)  mometasone-formoterol (DULERA) 200-5 MCG/ACT inhaler 2 puff (2 puffs Inhalation Not Given 11/16/22 0709)  OLANZapine (ZYPREXA) tablet 5 mg (has no administration in time range)  pantoprazole (PROTONIX) EC tablet 40 mg (40 mg Oral Not Given 11/16/22 1057)  lactated ringers bolus 1,000 mL (0 mLs Intravenous Stopped 11/15/22 2340)    Mobility non-ambulatory     Focused Assessments    R Recommendations: See Admitting Provider Note  Report given to:   Additional Notes:

## 2022-11-17 DIAGNOSIS — N179 Acute kidney failure, unspecified: Secondary | ICD-10-CM | POA: Diagnosis not present

## 2022-11-17 DIAGNOSIS — E870 Hyperosmolality and hypernatremia: Secondary | ICD-10-CM | POA: Diagnosis not present

## 2022-11-17 DIAGNOSIS — F03918 Unspecified dementia, unspecified severity, with other behavioral disturbance: Secondary | ICD-10-CM | POA: Diagnosis not present

## 2022-11-17 DIAGNOSIS — Z515 Encounter for palliative care: Secondary | ICD-10-CM | POA: Diagnosis not present

## 2022-11-17 LAB — BASIC METABOLIC PANEL
Anion gap: 12 (ref 5–15)
Anion gap: 8 (ref 5–15)
BUN: 56 mg/dL — ABNORMAL HIGH (ref 8–23)
BUN: 47 mg/dL — ABNORMAL HIGH (ref 8–23)
CO2: 20 mmol/L — ABNORMAL LOW (ref 22–32)
CO2: 24 mmol/L (ref 22–32)
Calcium: 10.4 mg/dL — ABNORMAL HIGH (ref 8.9–10.3)
Calcium: 10.2 mg/dL (ref 8.9–10.3)
Chloride: 123 mmol/L — ABNORMAL HIGH (ref 98–111)
Chloride: 116 mmol/L — ABNORMAL HIGH (ref 98–111)
Creatinine, Ser: 2.31 mg/dL — ABNORMAL HIGH (ref 0.44–1.00)
Creatinine, Ser: 2.16 mg/dL — ABNORMAL HIGH (ref 0.44–1.00)
GFR, Estimated: 20 mL/min — ABNORMAL LOW (ref >60)
GFR, Estimated: 22 mL/min — ABNORMAL LOW (ref >60)
Glucose, Bld: 75 mg/dL (ref 70–99)
Glucose, Bld: 155 mg/dL — ABNORMAL HIGH (ref 70–99)
Potassium: 3.5 mmol/L (ref 3.5–5.1)
Potassium: 3.1 mmol/L — ABNORMAL LOW (ref 3.5–5.1)
Sodium: 155 mmol/L — ABNORMAL HIGH (ref 135–145)
Sodium: 148 mmol/L — ABNORMAL HIGH (ref 135–145)

## 2022-11-17 MED ORDER — DEXTROSE 5 % IV SOLN: INTRAVENOUS | Status: AC

## 2022-11-17 NOTE — Evaluation (Signed)
Clinical/Bedside Swallow Evaluation Patient Details  Name: [name] MRN: [id number] Date of Birth: 03/28/1932  Today's Date: 11/17/2022 Time: SLP Start Time (ACUTE ONLY): 1138 SLP Stop Time (ACUTE ONLY): 1146 SLP Time Calculation (min) (ACUTE ONLY): 8 min  Past Medical History:  Past Medical History:  Diagnosis Date   Anemia, unspecified    Anxiety    Arthritis    Asthma    Delusions ([place])    Depression    Edema 2005   "since put the glass in them"    GERD (gastroesophageal reflux disease)    Lumbago    Lymphedema    Obstructive chronic bronchitis without exacerbation    Other abnormal glucose    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[place]) 4/0/9811   Systolic heart failure ([place])    EF 35 to 40% per echo January 2015   Unspecified essential hypertension    Unspecified nonpsychotic mental disorder    Unspecified venous (peripheral) insufficiency    Unspecified venous (peripheral) insufficiency    Unspecified vitamin D deficiency    Past Surgical History:  Past Surgical History:  Procedure Laterality Date   APPENDECTOMY     CERVICAL SPINE SURGERY     x 2   CHOLECYSTECTOMY     ESOPHAGOGASTRODUODENOSCOPY N/A 04/05/2015   Procedure: ESOPHAGOGASTRODUODENOSCOPY (EGD);  Surgeon: [name], MD;  Location: [facility];  Service: Endoscopy;  Laterality: N/A;   FLEXIBLE SIGMOIDOSCOPY N/A 04/05/2015   Procedure: FLEXIBLE SIGMOIDOSCOPY;  Surgeon: [name], MD;  Location[facility];  Service: Endoscopy;  Laterality: N/A;   HAND SURGERY     LUMBAR DIS[facility] SURGERY     PARTIAL HYSTERECTOMY     TONSILLECTOMY     HPI:  89 year old with history of dementia, chronic diastolic heart failure, CKD stage IIIb, COPD, hypertension and GERD, long-term nursing home resident sent to the ER with hypernatremia.    Assessment / Plan / Recommendation  Clinical Impression  Pt alert and pleasantly confused with student RN present for swallow assessment. Difficult to visualize oral cavity as pt turned  head when therapist attempted to inspect her dentition. It appears as if she may have upper and lower dentures. She could not follow commands for oromotor assessment but did not show any asymmetry. Her coordination of swallow and respiration was normal without concern for aspiration although she was not able to take sequential sips from straw with therapist. Oral manipulation of puree is timely and complete. She was able to masticate regular texture without difficulty and cleared oral cavity however given her dementia and current illness would recommend more conservative texture of Dys 2 (minced), thin liquids, crush pills and full assist with meals. Dys 2 is appropriate while in hospital and diet can be upgraded to her prior diet once medically back to her baseline at SNF. ST will sign off at this time. SLP Visit Diagnosis: Dysphagia, unspecified (R13.10)    Aspiration Risk  Mild aspiration risk    Diet Recommendation Dysphagia 2 (Fine chop);Thin liquid   Liquid Administration via: Cup;Straw Medication Administration: Crushed with puree Supervision: Staff to assist with self feeding Compensations: Slow rate;Small sips/bites;Minimize environmental distractions Postural Changes: Seated upright at 90 degrees    Other  Recommendations Oral Care Recommendations: Oral care BID    Recommendations for follow up therapy are one component of a multi-disciplinary discharge planning process, led by the attending physician.  Recommendations may be updated based on patient status, additional functional criteria and insurance authorization.  Follow up Recommendations No SLP follow up  Assistance Recommended at Discharge    Functional Status Assessment Patient has not had a recent decline in their functional status  Frequency and Duration            Prognosis        Swallow Study   General Date of Onset: 11/15/22 HPI: 89 year old with history of dementia, chronic diastolic heart failure, CKD  stage IIIb, COPD, hypertension and GERD, long-term nursing home resident sent to the ER with hypernatremia. Type of Study: Bedside Swallow Evaluation Previous Swallow Assessment:  (none) Diet Prior to this Study: Thin liquids;Other (Comment) (clear liquids) Temperature Spikes Noted: No Respiratory Status: Room air History of Recent Intubation: No Behavior/Cognition: Alert;Cooperative;Pleasant mood;Confused;Requires cueing Oral Cavity Assessment: Dry (dry lips) Oral Care Completed by SLP: Yes (just on outside of lips- removed dry skin) Oral Cavity - Dentition: Other (Comment) (difficult to visualize if dentition were dentures- they appear to be dentures) Vision: Functional for self-feeding Self-Feeding Abilities: Needs assist Patient Positioning: Upright in bed Baseline Vocal Quality: Normal Volitional Cough: Cognitively unable to elicit Volitional Swallow: Unable to elicit    Oral/Motor/Sensory Function Overall Oral Motor/Sensory Function:  (pt could not follow commands- no focal weakness)   Ice Chips Ice chips: Not tested   Thin Liquid Thin Liquid: Within functional limits Presentation: Straw    Nectar Thick Nectar Thick Liquid: Not tested   Honey Thick Honey Thick Liquid: Not tested   Puree Puree: Within functional limits   Solid     Solid: Within functional limits      Houston Siren 11/17/2022,12:17 PM

## 2022-11-17 NOTE — Progress Notes (Signed)
PROGRESS NOTE    [name]  RWE:[id number] DOB: 1932/08/22 DOA: 11/15/2022 PCP: Lauree Chandler, NP    Brief Narrative:  89 year old with history of dementia, chronic diastolic heart failure, CKD stage IIIb, COPD, hypertension and GERD, recently transition to nursing home sent to the ER with hypernatremia.  Patient was not able to give any history.  She has been less interactive since last few days and family requested to be evaluated. Sodium was 160 at the nursing home so sent to ER.   Assessment & Plan:   Hyperchloremic hypernatremia, severe dehydration due to inadequate water intake Acute kidney injury with underlying chronic kidney disease stage IIIb: Baseline creatinine 1.6.  Presented with sodium 163, chloride 129, creatinine 3.1.  Patient with no adequate free water intake. Initially resuscitated with 1 L of isotonic fluid.  Currently on dextrose infusion.  Will continue. Close monitoring every 12 hours.  Sodium 155 today.  Appropriately responding.  Is still without adequate oral intake. Urine output is adequate.  Renal function slightly trending down to her usual levels.  Hypercalcemia secondary to dehydration: IV fluids.  Improving.  Chronic diastolic heart failure: She will dehydrated.  Discontinue diuretics.  Dementia with behavioral disturbances: Zyprexa 5 mg at night as at nursing home.  Delirium precautions.  Fall precautions.  COPD: Stable.  Goal of care: Detailed discussion with patient's nephew yesterday.  Remains in poor clinical status.  Agreeable to meet with palliative care.  Consulted.   DVT prophylaxis: heparin injection 5,000 Units Start: 11/16/22 0600   Code Status: DNR. Family Communication: Alan [place] called and updated.he is looking forward to talk to palliative care team.  Disposition Plan: Status is: Inpatient Remains inpatient appropriate because: Severe hypernatremia     Consultants:  Palliative care  Procedures:   None  Antimicrobials:  None   Subjective: Patient seen and examined.  More awake today. Responded to greetings. Pleasant but confused.   Objective: Vitals:   11/17/22 0007 11/17/22 0450 11/17/22 0836 11/17/22 0850  BP: 131/86 (!) 150/67 (!) 154/70 (!) 154/70  Pulse: 77 66 60 (!) 59  Resp: '18 19 17 16  '$ Temp: (!) 97.5 F (36.4 C) 98.4 F (36.9 C) 98 F (36.7 C) 97.9 F (36.6 C)  TempSrc: Oral Oral Oral Oral  SpO2: 99% 99% 99% 100%  Weight:    50 kg  Height:    '4\' 11"'$  (1.499 m)   No intake or output data in the 24 hours ending 11/17/22 1125  Filed Weights   11/15/22 1933 11/16/22 1950 11/17/22 0850  Weight: 66 kg 50 kg 50 kg    Examination:  General exam: Appears Frail and debilitated. Alert and awake. Oriented to self. Not oriented to place or situation. Respiratory system: Clear to auscultation. Respiratory effort normal.  No added sounds. Cardiovascular system: S1 & S2 heard, RRR.  Gastrointestinal system: Soft.  Nontender.   Central nervous system: Moves all extremities.  Gets uncomfortable on stimulation.    Data Reviewed: I have personally reviewed following labs and imaging studies  CBC: Recent Labs  Lab 11/15/22 2138 11/16/22 0520  WBC 7.3 6.2  NEUTROABS 5.0  --   HGB 12.4 10.9*  HCT 45.1 38.1  MCV 94.2 91.1  PLT 257 619   Basic Metabolic Panel: Recent Labs  Lab 11/15/22 2138 11/16/22 0520 11/16/22 1226 11/16/22 2006 11/17/22 0359  NA 163* 159* 156* 152* 155*  K 4.2 3.8 3.3* 3.6 3.5  CL 129* 123* 122* 119* 123*  CO2 16* 24  26 25 20*  GLUCOSE 86 159* 206* 164* 75  BUN 74* 67* 64* 61* 56*  CREATININE 3.10* 2.59* 2.50* 2.51* 2.31*  CALCIUM 11.0* 10.6* 10.2 10.3 10.4*  MG 2.8*  --   --   --   --    GFR: Estimated Creatinine Clearance: 11.5 mL/min (A) (by C-G formula based on SCr of 2.31 mg/dL (H)). Liver Function Tests: Recent Labs  Lab 11/15/22 2138 11/16/22 0520  AST 23 23  ALT 12 10  ALKPHOS 69 61  BILITOT 0.8 0.6  PROT 8.8*  7.4  ALBUMIN 3.0* 2.6*   No results for input(s): "LIPASE", "AMYLASE" in the last 168 hours. No results for input(s): "AMMONIA" in the last 168 hours. Coagulation Profile: No results for input(s): "INR", "PROTIME" in the last 168 hours. Cardiac Enzymes: No results for input(s): "CKTOTAL", "CKMB", "CKMBINDEX", "TROPONINI" in the last 168 hours. BNP (last 3 results) No results for input(s): "PROBNP" in the last 8760 hours. HbA1C: No results for input(s): "HGBA1C" in the last 72 hours. CBG: Recent Labs  Lab 11/16/22 1053  GLUCAP 177*   Lipid Profile: No results for input(s): "CHOL", "HDL", "LDLCALC", "TRIG", "CHOLHDL", "LDLDIRECT" in the last 72 hours. Thyroid Function Tests: Recent Labs    11/15/22 2138  TSH 0.746   Anemia Panel: No results for input(s): "VITAMINB12", "FOLATE", "FERRITIN", "TIBC", "IRON", "RETICCTPCT" in the last 72 hours. Sepsis Labs: Recent Labs  Lab 11/15/22 2138 11/16/22 2006  LATICACIDVEN 1.8 1.8    No results found for this or any previous visit (from the past 240 hour(s)).       Radiology Studies: No results found.      Scheduled Meds:  heparin  5,000 Units Subcutaneous Q8H   mometasone-formoterol  2 puff Inhalation BID   OLANZapine  5 mg Oral QHS   pantoprazole  40 mg Oral Daily   sodium chloride flush  3 mL Intravenous Q12H   Continuous Infusions:  dextrose 100 mL/hr at 11/17/22 0904     LOS: 2 days    Time spent: 35 minutes     [name] Merino, MD [facility] Pager [phone] is also looking forward to talk to palliative care team.

## 2022-11-17 NOTE — Consult Note (Signed)
Consultation Note Date: 11/17/2022   Patient Name: [name]  DOB: 01-30-1932  MRN: [id number]  Age / Sex: 89 y.o., female  PCP: Lauree Chandler, NP Referring Physician: [name] Merino, MD  Reason for Consultation: Establishing goals of care   HPI/Brief Hospital Course: 89 y.o. female  with past medical history of dementia, chronic diastolic heart failure, CKD stage IIIb, COPD, and hypertension.  Last hospitalization occurring end of December 2023 for CHF exacerbation and pneumonia.  At that time discharged to [facility] skilled nursing facility for acute rehab. Admitted on 11/15/2022 with hyperchloremia and hypernatremia due to severe dehydration likely due to inadequate water intake.  Also noted acute on chronic kidney disease.  1/19 Ongoing inadequate p.o. intake Transitioned to DNR by primary team after conversations with nephew/Alan  Palliative medicine was consulted for assistance with goals of care conversations and support.  Subjective:  Extensive chart review has been completed prior to meeting patient including labs, vital signs, imaging, progress notes, orders, and available advanced directive documents from current and previous encounters.  Introduced myself as a Designer, jewellery as a member of the palliative care team. Explained palliative medicine is specialized medical care for people living with serious illness. It focuses on providing relief from the symptoms and stress of a serious illness. The goal is to improve quality of life for both the patient and the family.   Visited with Ms. Double at her bedside.  Awake and alert.  Follows simple commands.  Able to hold simple conversation with appropriate answers. Denies acute pain. Explains she does not have a desire to eat or drink at this time.  Nephew/Alan and his wife present at bedside.  Per Alan's request goals of care conversation had in waiting room. [name] explains Ms. Peppard  previously lived with he and his wife for several years prior to last hospitalization. He explains over the last several months she has required more care which is the reasoning behind her discharging to [facility] during last hospitalization.  He was unable to provide the amount of care she was requiring due to to him continuing to work full-time. Reviewed previously documented advanced directives.  According to chart [name] is listed as DURABLE [place].  He explains Toniette is widowed and does not have children.  A brief life review was provided by [name].  He explained to Deola works for [employer] for many years before being injured on the job and had to leave on disability.  She is widowed lost her husband in 2016.  At that time [name] became her primary caretaker.  He further explains Melane was a vibrant independent woman.  She has expressed to him in the past her desires to remain cared for in the home for as long as possible.  [name] further explains he has noticed a significant decline in Yanique's overall condition since admission to [facility].  He is able to express understanding of the expected decline with Pamala Hurry having underlying dementia as well as other multiple comorbidities. We further discussed the difference between continuing with full aggressive medical interventions versus transitioning to comfort focused care.  Discussed transition to comfort care meaning patient would no longer receive aggressive medical interventions such as continuous vital signs, lab work, radiology testing, or medications not focused on comfort. All care would focus on how the patient is looking and feeling. This would include management of any symptoms that may cause discomfort, pain, shortness of breath, cough, nausea, agitation, anxiety, and/or secretions etc. Symptoms would  be managed with medications and other non-pharmacological interventions such as spiritual support if requested, repositioning,  music therapy, or therapeutic listening. [name] verbalized understanding and appreciation.    [name] is in agreement to transition to comfort care and exploration of hospice eligibility. He requested I speak with his cousin/Rachel on the phone and share our conversation. [name] explains there are several other family members that reside in [place] and [place] that he would like the chance to speak with prior to making decisions to allow them time to process. Encouraged [name] to have these conversations and we would remain providing current care. In the event Ms. Hinojos declined or began exhibiting signs of suffering we would transition to comfort care at that time.  [name] agreed to order being placed for hospice consultation at this time. Explained PMT will round over the weekend to continue providing support assess family's readiness to proceed with transition to comfort care.  I discussed importance of continued conversations with family/support persons and all members of their medical team regarding overall plan of care and treatment options ensuring decisions are in alignment with patients goals of care.  All questions/concerns addressed. Emotional support provided to patient/family/support persons. PMT will continue to follow and support patient as needed.  Objective: Primary Diagnoses: Present on Admission:  Hypernatremia  Acute renal failure superimposed on stage 3b chronic kidney disease (HCC)  (HFpEF) heart failure with preserved ejection fraction (HCC)  Hypercalcemia  Dementia with behavioral disturbance (HCC)  Essential hypertension  COPD (chronic obstructive pulmonary disease) ([place])   Physical Exam Constitutional:      General: She is not in acute distress.    Appearance: She is ill-appearing.     Comments: Frail, elderly  Pulmonary:     Effort: Pulmonary effort is normal. No respiratory distress.  Abdominal:     General: Abdomen is flat.     Palpations: Abdomen is soft.      Tenderness: There is no abdominal tenderness.  Musculoskeletal:     Right lower leg: Edema present.     Left lower leg: Edema present.  Skin:    General: Skin is warm and dry.  Neurological:     Mental Status: She is alert. She is disoriented.     Vital Signs: BP (!) 156/78 (BP Location: Right Arm)   Pulse 68   Temp 99 F (37.2 C) (Oral)   Resp 16   Ht '4\' 11"'$  (1.499 m)   Wt 50 kg   SpO2 100%   BMI 22.26 kg/m  Pain Scale: 0-10  Palliative Assessment/Data:40%   Assessment and Plan  SUMMARY OF RECOMMENDATIONS   DNR/continue current scope of treatment Ongoing family conversations regarding goals of care Nephew/Alan in agreement with comfort care and desires further conversations surrounding hospice eligibility TOC order placed for hospice conversations PMT to continue to follow for ongoing support  Thank you for this consult and allowing Palliative Medicine to participate in the care of [facility]. Palliative medicine will continue to follow and assist as needed.   Time Total: 90 minutes Greater than 50%  of this time was spent counseling and coordinating care related to the above assessment and plan.  Signed by: Theodoro Grist, DNP, AGNP-C Palliative Medicine    Please contact Palliative Medicine Team phone at [phone] for questions and concerns.  For individual provider: See Shea Evans

## 2022-11-18 DIAGNOSIS — Z515 Encounter for palliative care: Secondary | ICD-10-CM

## 2022-11-18 DIAGNOSIS — Z7189 Other specified counseling: Secondary | ICD-10-CM

## 2022-11-18 DIAGNOSIS — E870 Hyperosmolality and hypernatremia: Secondary | ICD-10-CM | POA: Diagnosis not present

## 2022-11-18 LAB — BASIC METABOLIC PANEL
Anion gap: 9 (ref 5–15)
BUN: 39 mg/dL — ABNORMAL HIGH (ref 8–23)
CO2: 22 mmol/L (ref 22–32)
Calcium: 10.3 mg/dL (ref 8.9–10.3)
Chloride: 111 mmol/L (ref 98–111)
Creatinine, Ser: 1.93 mg/dL — ABNORMAL HIGH (ref 0.44–1.00)
GFR, Estimated: 25 mL/min — ABNORMAL LOW (ref >60)
Glucose, Bld: 177 mg/dL — ABNORMAL HIGH (ref 70–99)
Potassium: 3.3 mmol/L — ABNORMAL LOW (ref 3.5–5.1)
Sodium: 142 mmol/L (ref 135–145)

## 2022-11-18 MED ORDER — DEXTROSE 5 % IV SOLN: INTRAVENOUS | Status: DC

## 2022-11-18 MED ORDER — POTASSIUM CHLORIDE CRYS ER 20 MEQ PO TBCR
20.0000 meq | EXTENDED_RELEASE_TABLET | Freq: Two times a day (BID) | ORAL | Status: AC
  Administered 2022-11-18 (×2): 20 meq via ORAL
  Filled 2022-11-18 (×2): qty 1

## 2022-11-18 NOTE — Progress Notes (Signed)
PROGRESS NOTE    [name]  TIW:[id number] DOB: 1932-02-14 DOA: 11/15/2022 PCP: Lauree Chandler, NP    Brief Narrative:  89 year old with history of dementia, chronic diastolic heart failure, CKD stage IIIb, COPD, hypertension and GERD, recently transition to nursing home sent to the ER with hypernatremia.  Patient was not able to give any history.  She has been less interactive since last few days and family requested to be evaluated. Sodium was 160 at the nursing home so sent to ER.   Assessment & Plan:   Hyperchloremic hypernatremia, severe dehydration due to inadequate water intake Acute kidney injury with underlying chronic kidney disease stage IIIb: Baseline creatinine 1.6.  Presented with sodium 163, chloride 129, creatinine 3.1.  Patient with no adequate free water intake. Initially resuscitated with 1 L of isotonic fluid.  Currently on dextrose infusion. Sodium level is improving and renal functions improving on dextrose.  She is still without adequate oral intake. Urine output is adequate.  Renal function slightly trending down to her usual levels. See goal of care discussion below.  Hypokalemia: Will attempt to replace by mouth.  Hypercalcemia secondary to dehydration: IV fluids.  Improving.  Chronic diastolic heart failure: Discontinue diuretics.  Dementia with behavioral disturbances: Zyprexa 5 mg at night as at nursing home.  Delirium precautions.  Fall precautions.  COPD: Stable.  Goal of care: Followed by palliative care.  Currently DNR.   Palliative and hospice discussion ongoing.  Remains very frail debilitated without adequate oral intake.   Discussed on today with power of attorney, desires to continue supportive care including IV fluids and wants to explore hospice options. Consult hospice.  She probably qualifies for inpatient hospice. Patient likes sugary drinks, will change to regular diet so she has more choices.  At this point it is more like  palliative eating.  Encourage oral intake and liquid intake.   DVT prophylaxis: heparin injection 5,000 Units Start: 11/16/22 0600   Code Status: DNR. Family Communication: Alan [place] called and updated.he is looking forward to talk to palliative and hospice team as well. Disposition Plan: Status is: Inpatient Remains inpatient appropriate because: Severe hypernatremia     Consultants:  Palliative care  Procedures:  None  Antimicrobials:  None   Subjective:  Patient seen and examined.  She tells me she does not feel well.  Could not explain further symptoms.  No other overnight events.  Objective: Vitals:   11/17/22 2032 11/17/22 2301 11/18/22 0446 11/18/22 0900  BP: (!) 149/83 138/75 (!) 162/64 (!) 152/92  Pulse: 84 76 71 74  Resp: '18 20 17 16  '$ Temp: 98.5 F (36.9 C) 98.6 F (37 C) 98 F (36.7 C) 97.8 F (36.6 C)  TempSrc: Oral Oral Oral Axillary  SpO2: 100% 99% 100% 100%  Weight:      Height:        Intake/Output Summary (Last 24 hours) at 11/18/2022 1053 Last data filed at 11/18/2022 1015 Gross per 24 hour  Intake 240 ml  Output 1050 ml  Net -810 ml    Filed Weights   11/15/22 1933 11/16/22 1950 11/17/22 0850  Weight: 66 kg 50 kg 50 kg    Examination:  General exam: Appears Frail and debilitated.  She is awake and oriented to self.  Answers few basic questions.  She is not oriented to place time or circumstances.  Mittens in place. Respiratory system: Clear to auscultation. Respiratory effort normal.  No added sounds. Cardiovascular system: S1 & S2 heard, RRR.  Gastrointestinal system: Soft.  Nontender.   Central nervous system: Moves all extremities.  Looks uncomfortable.   Data Reviewed: I have personally reviewed following labs and imaging studies  CBC: Recent Labs  Lab 11/15/22 2138 11/16/22 0520  WBC 7.3 6.2  NEUTROABS 5.0  --   HGB 12.4 10.9*  HCT 45.1 38.1  MCV 94.2 91.1  PLT 257 448   Basic Metabolic Panel: Recent Labs  Lab  11/15/22 2138 11/16/22 0520 11/16/22 1226 11/16/22 2006 11/17/22 0359 11/17/22 1653 11/18/22 0653  NA 163*   < > 156* 152* 155* 148* 142  K 4.2   < > 3.3* 3.6 3.5 3.1* 3.3*  CL 129*   < > 122* 119* 123* 116* 111  CO2 16*   < > 26 25 20* 24 22  GLUCOSE 86   < > 206* 164* 75 155* 177*  BUN 74*   < > 64* 61* 56* 47* 39*  CREATININE 3.10*   < > 2.50* 2.51* 2.31* 2.16* 1.93*  CALCIUM 11.0*   < > 10.2 10.3 10.4* 10.2 10.3  MG 2.8*  --   --   --   --   --   --    < > = values in this interval not displayed.   GFR: Estimated Creatinine Clearance: 13.7 mL/min (A) (by C-G formula based on SCr of 1.93 mg/dL (H)). Liver Function Tests: Recent Labs  Lab 11/15/22 2138 11/16/22 0520  AST 23 23  ALT 12 10  ALKPHOS 69 61  BILITOT 0.8 0.6  PROT 8.8* 7.4  ALBUMIN 3.0* 2.6*   No results for input(s): "LIPASE", "AMYLASE" in the last 168 hours. No results for input(s): "AMMONIA" in the last 168 hours. Coagulation Profile: No results for input(s): "INR", "PROTIME" in the last 168 hours. Cardiac Enzymes: No results for input(s): "CKTOTAL", "CKMB", "CKMBINDEX", "TROPONINI" in the last 168 hours. BNP (last 3 results) No results for input(s): "PROBNP" in the last 8760 hours. HbA1C: No results for input(s): "HGBA1C" in the last 72 hours. CBG: Recent Labs  Lab 11/16/22 1053  GLUCAP 177*   Lipid Profile: No results for input(s): "CHOL", "HDL", "LDLCALC", "TRIG", "CHOLHDL", "LDLDIRECT" in the last 72 hours. Thyroid Function Tests: Recent Labs    11/15/22 2138  TSH 0.746   Anemia Panel: No results for input(s): "VITAMINB12", "FOLATE", "FERRITIN", "TIBC", "IRON", "RETICCTPCT" in the last 72 hours. Sepsis Labs: Recent Labs  Lab 11/15/22 2138 11/16/22 2006  LATICACIDVEN 1.8 1.8    No results found for this or any previous visit (from the past 240 hour(s)).       Radiology Studies: No results found.      Scheduled Meds:  heparin  5,000 Units Subcutaneous Q8H    mometasone-formoterol  2 puff Inhalation BID   OLANZapine  5 mg Oral QHS   pantoprazole  40 mg Oral Daily   potassium chloride  20 mEq Oral BID   sodium chloride flush  3 mL Intravenous Q12H   Continuous Infusions:  dextrose       LOS: 3 days    Time spent: 35 minutes     [name] Merino, MD [facility] Pager [phone] is also looking forward to talk to palliative care team.

## 2022-11-18 NOTE — Progress Notes (Signed)
Palliative Medicine Inpatient Follow Up Note HPI: 89 y.o. female  with past medical history of dementia, chronic diastolic heart failure, CKD stage IIIb, COPD, and hypertension.  Last hospitalization occurring end of December 2023 for CHF exacerbation and pneumonia.  At that time discharged to [facility] skilled nursing facility for acute rehab. Admitted on 11/15/2022 with hyperchloremia and hypernatremia due to severe dehydration likely due to inadequate water intake.  Also noted acute on chronic kidney disease.   1/19 Ongoing inadequate p.o. intake Transitioned to DNR by primary team after conversations with nephew/Alan   Palliative medicine was consulted for assistance with goals of care conversations and support.  Today's Discussion 11/18/2022  *Please note that this is a verbal dictation therefore any spelling or grammatical errors are due to the "[employer]" system interpretation.  Chart reviewed inclusive of vital signs, progress notes, laboratory results, and diagnostic images.   I met at bedside this morning with [name].  She was noted to be resting comfortably in no acute distress.  When her shoulder was gently touched she did arouse though not able to vocally say anything at that time.  I met with patient's nephew [name] this afternoon.  We had a discussion regarding the options moving forward one of which would be continued aggressive care which we are doing at this time.  The other option provided was that of comfort care. We talked about transition to comfort measures in house and what that would entail inclusive of medications to control pain, dyspnea, agitation, nausea, itching, and hiccups.  We discussed stopping all uneccessary measures such as cardiac monitoring, blood draws, needle sticks, and frequent vital signs.   [name] expresses the difficulty in making this decision as he has a large family and wants to ensure that everyone who holds a stake is able to share their  thoughts with him.  He recognizes that at the end of the day he is her primary decision maker and will make a decision within her best interest.  Per conversation [name] would like until Tuesday to speak to family and is interested in meeting with the palliative care team as well to further identify choices moving forward in the presence of family.   [name] expresses that he will be meeting with [facility] for further conversation as related to their inpatient services in the meanwhile.   Questions and concerns addressed/Palliative Support Provided.   Objective Assessment: Vital Signs Vitals:   11/18/22 0900 11/18/22 1102  BP: (!) 152/92 (!) 166/61  Pulse: 74 78  Resp: 16 16  Temp: 97.8 F (36.6 C) 98.8 F (37.1 C)  SpO2: 100% 100%    Intake/Output Summary (Last 24 hours) at 11/18/2022 1603 Last data filed at 11/18/2022 1300 Gross per 24 hour  Intake 290 ml  Output 850 ml  Net -560 ml   Last Weight  Most recent update: 11/17/2022  9:02 AM    Weight  50 kg (110 lb 3.7 oz)            Gen: Frail elderly African-American female in no acute distress HEENT: moist mucous membranes CV: Regular rate and rhythm PULM: On room air breathing is even and unlabored ABD: soft/nontender EXT: No edema Neuro: Opens eyes  SUMMARY OF RECOMMENDATIONS   DNAR/DNI  Continue current scope of care  Patient's nephew, [name] would like more time to speak with his extended family regarding the possibility of hospice care  Plan for follow-up meeting on Tuesday in the afternoon to  determine care direction  Appreciate TOC referral so that patient's nephew can get additional information on hospice of the [facility] inpatient hospice in [facility]  Ongoing palliative care support  Time Spent:65 Billing based on MDM: High ______________________________________________________________________________________ [facility] Team Team Cell Phone:  [phone] Please utilize secure chat with additional questions, if there is no response within 30 minutes please call the above phone number  Palliative Medicine Team providers are available by phone from 7am to 7pm daily and can be reached through the team cell phone.  Should this patient require assistance outside of these hours, please call the patient's attending physician.

## 2022-11-18 NOTE — Social Work (Signed)
CSW was alerted that family wanted a consultation with the [facility] to explore their options. CSW called [facility] and spoke with Benjamine Mola and she stated that she would follow up with the nephew.

## 2022-11-19 DIAGNOSIS — Z515 Encounter for palliative care: Secondary | ICD-10-CM | POA: Diagnosis not present

## 2022-11-19 DIAGNOSIS — E870 Hyperosmolality and hypernatremia: Secondary | ICD-10-CM | POA: Diagnosis not present

## 2022-11-19 LAB — BASIC METABOLIC PANEL
Anion gap: 9 (ref 5–15)
BUN: 30 mg/dL — ABNORMAL HIGH (ref 8–23)
CO2: 21 mmol/L — ABNORMAL LOW (ref 22–32)
Calcium: 10.3 mg/dL (ref 8.9–10.3)
Chloride: 110 mmol/L (ref 98–111)
Creatinine, Ser: 1.78 mg/dL — ABNORMAL HIGH (ref 0.44–1.00)
GFR, Estimated: 27 mL/min — ABNORMAL LOW (ref >60)
Glucose, Bld: 154 mg/dL — ABNORMAL HIGH (ref 70–99)
Potassium: 3.3 mmol/L — ABNORMAL LOW (ref 3.5–5.1)
Sodium: 140 mmol/L (ref 135–145)

## 2022-11-19 MED ORDER — POTASSIUM CHLORIDE CRYS ER 20 MEQ PO TBCR
20.0000 meq | EXTENDED_RELEASE_TABLET | Freq: Two times a day (BID) | ORAL | Status: AC
  Administered 2022-11-19 – 2022-11-20 (×4): 20 meq via ORAL
  Filled 2022-11-19 (×4): qty 1

## 2022-11-19 NOTE — Progress Notes (Signed)
Attempted to assist patient to eat her lunch but she did not want to eat, stated she had already eaten.

## 2022-11-19 NOTE — Progress Notes (Signed)
   Palliative Medicine Inpatient Follow Up Note HPI: 89 y.o. female  with past medical history of dementia, chronic diastolic heart failure, CKD stage IIIb, COPD, and hypertension.  Last hospitalization occurring end of December 2023 for CHF exacerbation and pneumonia.  At that time discharged to [facility] skilled nursing facility for acute rehab. Admitted on 11/15/2022 with hyperchloremia and hypernatremia due to severe dehydration likely due to inadequate water intake.  Also noted acute on chronic kidney disease.  Palliative medicine was consulted for assistance with goals of care conversations and support.  Today's Discussion 11/19/2022  *Please note that this is a verbal dictation therefore any spelling or grammatical errors are due to the "[place] [employer]" system interpretation.  Chart reviewed inclusive of vital signs, progress notes, laboratory results, and diagnostic images.   I met at bedside this morning with [name] this morning. She is awake and whispers very softly her name when asked. She does not vocalize much more beyond this.   I helped patients RN, Legrand Como reposition patient in bed. He has no concerns regarding the patient at this time.   I spoke to patients nephew, [name]. He states that his family will be here on Wednesday at [place] to meet with Hospice and requests for our presence at that time as well. I shared that I would pass this information along to my team.   Questions and concerns addressed/Palliative Support Provided.   Objective Assessment: Vital Signs Vitals:   11/19/22 0908 11/19/22 1128  BP:  (!) 144/93  Pulse:  72  Resp:  20  Temp:  99.1 F (37.3 C)  SpO2: 100% 97%    Intake/Output Summary (Last 24 hours) at 11/19/2022 1454 Last data filed at 11/19/2022 6195 Gross per 24 hour  Intake 1813.45 ml  Output 1450 ml  Net 363.45 ml    Last Weight  Most recent update: 11/17/2022  9:02 AM    Weight  50 kg (110 lb 3.7 oz)            Gen: Frail  elderly African-American female in no acute distress HEENT: moist mucous membranes CV: Regular rate and rhythm PULM: On room air breathing is even and unlabored ABD: soft/nontender EXT: No edema Neuro: Opens eyes  SUMMARY OF RECOMMENDATIONS   DNAR/DNI  Continue current scope of care  Patient's nephew, [name] would like more time to speak with his extended family regarding the possibility of hospice care  [facility] [place] and patients nephew have a meeting arrange for Wednesday at [place] he has requested the presence of the PMT  Ongoing palliative care support --> My colleague, Theodoro Grist will be present tomorrow to continue care  Billing based on MDM: Moderate ______________________________________________________________________________________ [place] Team Team Cell Phone: [phone] Please utilize secure chat with additional questions, if there is no response within 30 minutes please call the above phone number  Palliative Medicine Team providers are available by phone from 7am to 7pm daily and can be reached through the team cell phone.  Should this patient require assistance outside of these hours, please call the patient's attending physician.

## 2022-11-19 NOTE — Progress Notes (Signed)
Attempted to help patient eat breakfast, patient did not want to eat at this time, will attempt again in a little while.

## 2022-11-19 NOTE — Progress Notes (Signed)
PROGRESS NOTE    [name]  EHM:[id number] DOB: May 24, 1932 DOA: 11/15/2022 PCP: Lauree Chandler, NP    Brief Narrative:  89 year old with history of dementia, chronic diastolic heart failure, CKD stage IIIb, COPD, hypertension and GERD, recently transition to nursing home sent to the ER with hypernatremia.  Patient was not able to give any history.  She has been less interactive since last few days and family requested to be evaluated. Sodium was 160 at the nursing home so sent to ER.   Assessment & Plan:   Hyperchloremic hypernatremia, severe dehydration due to inadequate water intake Acute kidney injury with underlying chronic kidney disease stage IIIb: Baseline creatinine 1.6.  Presented with sodium 163, chloride 129, creatinine 3.1.  Patient with no adequate free water intake. Initially resuscitated with 1 L of isotonic fluid.  Currently on dextrose infusion. Continue 5% dextrose, decreased to 50 mL/h. Sodium level is improving and renal functions improving on dextrose.  She is still without any oral intake. Urine output is adequate.  Renal function slightly trending down to her usual levels. See goal of care discussion below.  Hypokalemia: Replace by mouth.  Hypercalcemia secondary to dehydration: IV fluids.  Improving.  Chronic diastolic heart failure: Discontinue diuretics.  Dementia with behavioral disturbances: Zyprexa 5 mg at night as at nursing home.  Delirium precautions.  Fall precautions.  COPD: Stable.  Goal of care: Followed by palliative care.  Currently DNR.   Palliative and hospice discussion ongoing. Remains very frail debilitated without adequate oral intake.   She probably qualifies for inpatient hospice. Referral made to [facility] [place]. Continue supportive care for now.   DVT prophylaxis: heparin injection 5,000 Units Start: 11/16/22 0600   Code Status: DNR. Family Communication: Alan [place] on phone 1/20.   Disposition Plan: Status  is: Inpatient Remains inpatient appropriate because: Severe hypernatremia, unsafe discharge disposition.  Working on hospice referral.     Consultants:  Palliative care  Procedures:  None  Antimicrobials:  None   Subjective:  Seen and examined in the morning rounds.  Did not wake up.  When she woke up she was confused.  Refused food.  Objective: Vitals:   11/19/22 0414 11/19/22 0756 11/19/22 0908 11/19/22 1128  BP: (!) 141/97 (!) 146/82  (!) 144/93  Pulse: 66 81  72  Resp: '16 16  20  '$ Temp: 98.3 F (36.8 C) 98.6 F (37 C)  99.1 F (37.3 C)  TempSrc: Axillary Axillary  Oral  SpO2: 99% 99% 100% 97%  Weight:      Height:        Intake/Output Summary (Last 24 hours) at 11/19/2022 1152 Last data filed at 11/19/2022 0756 Gross per 24 hour  Intake 1863.45 ml  Output 1450 ml  Net 413.45 ml     Filed Weights   11/15/22 1933 11/16/22 1950 11/17/22 0850  Weight: 66 kg 50 kg 50 kg    Examination:  General: Very frail and debilitated.  Chronically sick looking.  Looks comfortable except when stimulated. Cardiovascular: S1-S2 normal.  Regular rate rhythm. Respiratory: Bilateral clear.  No added sounds. Gastrointestinal: Soft.  Nontender.  Bowel sound present. Ext: No edema or cyanosis. Neuro: Mostly sleepy.  Alert to stimulation but not oriented.  Follows simple commands but inconsistently.  Data Reviewed: I have personally reviewed following labs and imaging studies  CBC: Recent Labs  Lab 11/15/22 2138 11/16/22 0520  WBC 7.3 6.2  NEUTROABS 5.0  --   HGB 12.4 10.9*  HCT 45.1 38.1  MCV 94.2 91.1  PLT 257 197    Basic Metabolic Panel: Recent Labs  Lab 11/15/22 2138 11/16/22 0520 11/16/22 2006 11/17/22 0359 11/17/22 1653 11/18/22 0653 11/19/22 0109  NA 163*   < > 152* 155* 148* 142 140  K 4.2   < > 3.6 3.5 3.1* 3.3* 3.3*  CL 129*   < > 119* 123* 116* 111 110  CO2 16*   < > 25 20* 24 22 21*  GLUCOSE 86   < > 164* 75 155* 177* 154*  BUN 74*   < > 61*  56* 47* 39* 30*  CREATININE 3.10*   < > 2.51* 2.31* 2.16* 1.93* 1.78*  CALCIUM 11.0*   < > 10.3 10.4* 10.2 10.3 10.3  MG 2.8*  --   --   --   --   --   --    < > = values in this interval not displayed.    GFR: Estimated Creatinine Clearance: 14.9 mL/min (A) (by C-G formula based on SCr of 1.78 mg/dL (H)). Liver Function Tests: Recent Labs  Lab 11/15/22 2138 11/16/22 0520  AST 23 23  ALT 12 10  ALKPHOS 69 61  BILITOT 0.8 0.6  PROT 8.8* 7.4  ALBUMIN 3.0* 2.6*    No results for input(s): "LIPASE", "AMYLASE" in the last 168 hours. No results for input(s): "AMMONIA" in the last 168 hours. Coagulation Profile: No results for input(s): "INR", "PROTIME" in the last 168 hours. Cardiac Enzymes: No results for input(s): "CKTOTAL", "CKMB", "CKMBINDEX", "TROPONINI" in the last 168 hours. BNP (last 3 results) No results for input(s): "PROBNP" in the last 8760 hours. HbA1C: No results for input(s): "HGBA1C" in the last 72 hours. CBG: Recent Labs  Lab 11/16/22 1053  GLUCAP 177*    Lipid Profile: No results for input(s): "CHOL", "HDL", "LDLCALC", "TRIG", "CHOLHDL", "LDLDIRECT" in the last 72 hours. Thyroid Function Tests: No results for input(s): "TSH", "T4TOTAL", "FREET4", "T3FREE", "THYROIDAB" in the last 72 hours.  Anemia Panel: No results for input(s): "VITAMINB12", "FOLATE", "FERRITIN", "TIBC", "IRON", "RETICCTPCT" in the last 72 hours. Sepsis Labs: Recent Labs  Lab 11/15/22 2138 11/16/22 2006  LATICACIDVEN 1.8 1.8     No results found for this or any previous visit (from the past 240 hour(s)).       Radiology Studies: No results found.      Scheduled Meds:  heparin  5,000 Units Subcutaneous Q8H   mometasone-formoterol  2 puff Inhalation BID   OLANZapine  5 mg Oral QHS   pantoprazole  40 mg Oral Daily   potassium chloride  20 mEq Oral BID   sodium chloride flush  3 mL Intravenous Q12H   Continuous Infusions:  dextrose 50 mL/hr at 11/19/22 1052      LOS: 4 days    Time spent: 35 minutes     [name] Merino, MD [facility] Pager [phone] is also looking forward to talk to palliative care team.

## 2022-11-20 ENCOUNTER — Ambulatory Visit: Payer: Medicare Other

## 2022-11-20 ENCOUNTER — Encounter: Payer: Medicare Other | Admitting: Occupational Therapy

## 2022-11-20 ENCOUNTER — Ambulatory Visit: Payer: Medicare Other | Admitting: Nurse Practitioner

## 2022-11-20 DIAGNOSIS — Z515 Encounter for palliative care: Secondary | ICD-10-CM | POA: Diagnosis not present

## 2022-11-20 DIAGNOSIS — E870 Hyperosmolality and hypernatremia: Secondary | ICD-10-CM | POA: Diagnosis not present

## 2022-11-20 DIAGNOSIS — N179 Acute kidney failure, unspecified: Secondary | ICD-10-CM | POA: Diagnosis not present

## 2022-11-20 LAB — BASIC METABOLIC PANEL
Anion gap: 5 (ref 5–15)
BUN: 25 mg/dL — ABNORMAL HIGH (ref 8–23)
CO2: 24 mmol/L (ref 22–32)
Calcium: 10.3 mg/dL (ref 8.9–10.3)
Chloride: 110 mmol/L (ref 98–111)
Creatinine, Ser: 1.82 mg/dL — ABNORMAL HIGH (ref 0.44–1.00)
GFR, Estimated: 26 mL/min — ABNORMAL LOW (ref >60)
Glucose, Bld: 109 mg/dL — ABNORMAL HIGH (ref 70–99)
Potassium: 4.2 mmol/L (ref 3.5–5.1)
Sodium: 139 mmol/L (ref 135–145)

## 2022-11-20 NOTE — Evaluation (Signed)
Physical Therapy Evaluation Patient Details Name: [name] MRN: [id number] DOB: [birth year] Today's Date: 11/20/2022  History of Present Illness  Pt is 89 year old presented to [facility] on  11/15/22 with hypernatremia due to severe dehydration due to inadequate water intake. Pt also with AKI.  PMH - dementia, chf, ckd, copd, htn  Clinical Impression  Pt presents to PT from SNF with very limited mobility due to weakness, poor cognition, and poor balance. When pt was hospitalized in Dec 2023 she initially was able to amb with therapy. Mobility declined and pt was not amb at time of dc to SNF. Unsure of her progress/status with mobility at SNF since then. If pt has been amb with assist at SNF in the interim she may have a chance to regain some ambulation. If she has been nonambulatory despite PT in the SNF it is unlikely she will regain that mobility. Will try PT here to see if she shows any progress. Agree with plan to discuss palliative/hospice with family.        Recommendations for follow up therapy are one component of a multi-disciplinary discharge planning process, led by the attending physician.  Recommendations may be updated based on patient status, additional functional criteria and insurance authorization.  Follow Up Recommendations Skilled nursing-short term rehab (<3 hours/day) Can patient physically be transported by private vehicle: No    Assistance Recommended at Discharge Frequent or constant Supervision/Assistance  Patient can return home with the following  Two people to help with walking and/or transfers;Two people to help with bathing/dressing/bathroom;Assistance with feeding;Assist for transportation    Equipment Recommendations None recommended by PT  Recommendations for Other Services       Functional Status Assessment Patient has had a recent decline in their functional status and/or demonstrates limited ability to make significant improvements in function in a  reasonable and predictable amount of time     Precautions / Restrictions Precautions Precautions: Fall      Mobility  Bed Mobility Overal bed mobility: Needs Assistance Bed Mobility: Supine to Sit, Sit to Supine     Supine to sit: Total assist, HOB elevated Sit to supine: Max assist   General bed mobility comments: Assist for all aspect for pt to come to sitting EOB. Pt anxious/fearful leading her to lean posterior increasing effort required.    Transfers                   General transfer comment: Unable to attempt with 1 person assist    Ambulation/Gait                  Stairs            Wheelchair Mobility    Modified Rankin (Stroke Patients Only)       Balance Overall balance assessment: Needs assistance Sitting-balance support: Bilateral upper extremity supported, Feet supported Sitting balance-Leahy Scale: Poor Sitting balance - Comments: Sat EOB x 7 minutes with min to mod assist Postural control: Posterior lean                                   Pertinent Vitals/Pain Pain Assessment Breathing: normal Negative Vocalization: none Facial Expression: sad, frightened, frown (with movement) Body Language: relaxed Consolability: no need to console PAINAD Score: 1    Home Living Family/patient expects to be discharged to:: Skilled nursing facility  Prior Function Prior Level of Function : Patient poor historian/Family not available             Mobility Comments: During hospitalization around Christmas pt was initially ambulatory with Therapy but declined and was not ambulating at DC to SNF. Unknown progress/status at SNF       Hand Dominance        Extremity/Trunk Assessment   Upper Extremity Assessment Upper Extremity Assessment: Defer to OT evaluation    Lower Extremity Assessment Lower Extremity Assessment: Difficult to assess due to impaired cognition        Communication   Communication: No difficulties  Cognition Arousal/Alertness: Awake/alert Behavior During Therapy: WFL for tasks assessed/performed Overall Cognitive Status: History of cognitive impairments - at baseline Area of Impairment: Orientation, Memory, Attention, Following commands, Safety/judgement, Problem solving, Awareness                 Orientation Level: Disoriented to, Place, Time, Situation Current Attention Level: Sustained Memory: Decreased short-term memory Following Commands: Follows one step commands inconsistently Safety/Judgement: Decreased awareness of safety, Decreased awareness of deficits Awareness: Intellectual Problem Solving: Slow processing, Requires verbal cues, Requires tactile cues, Decreased initiation, Difficulty sequencing          General Comments      Exercises     Assessment/Plan    PT Assessment Patient needs continued PT services  PT Problem List Decreased strength;Decreased activity tolerance;Decreased balance;Decreased mobility       PT Treatment Interventions DME instruction;Gait training;Functional mobility training;Therapeutic activities;Therapeutic exercise;Balance training;Patient/family education    PT Goals (Current goals can be found in the Care Plan section)  Acute Rehab PT Goals Patient Stated Goal: not stated PT Goal Formulation: Patient unable to participate in goal setting Time For Goal Achievement: 12/04/22 Potential to Achieve Goals: Fair    Frequency Min 2X/week     Co-evaluation               AM-PAC PT "6 Clicks" Mobility  Outcome Measure Help needed turning from your back to your side while in a flat bed without using bedrails?: A Lot Help needed moving from lying on your back to sitting on the side of a flat bed without using bedrails?: Total Help needed moving to and from a bed to a chair (including a wheelchair)?: Total Help needed standing up from a chair using your arms (e.g.,  wheelchair or bedside chair)?: Total Help needed to walk in hospital room?: Total Help needed climbing 3-5 steps with a railing? : Total 6 Click Score: 7    End of Session   Activity Tolerance: Other (comment) (Limited by cognition) Patient left: in bed;with call bell/phone within reach;with bed alarm set   PT Visit Diagnosis: Other abnormalities of gait and mobility (R26.89);Muscle weakness (generalized) (M62.81)    Time: 4967-5916 PT Time Calculation (min) (ACUTE ONLY): 15 min   Charges:   PT Evaluation $PT Eval Moderate Complexity: [place] [facility] [place] 11/20/2022, 7:15 PM

## 2022-11-20 NOTE — Progress Notes (Signed)
Daily Progress Note   Patient Name: [name]      Date: 11/20/2022 DOB: August 02, 1932  Age: 89 y.o. MRN#: [id number] Attending Physician: [name] Merino, MD Primary Care Physician: Lauree Chandler, NP Admit Date: 11/15/2022  Reason for Consultation/Follow-up: Establishing goals of care  HPI/Brief Hospital Review: 89 y.o. female  with past medical history of dementia, chronic diastolic heart failure, CKD stage IIIb, COPD, and hypertension.  Last hospitalization occurring end of December 2023 for CHF exacerbation and pneumonia.  At that time discharged to [facility] skilled nursing facility for acute rehab. Admitted on 11/15/2022 with hyperchloremia and hypernatremia due to severe dehydration likely due to inadequate water intake.  Also noted acute on chronic kidney disease.  1/22 Ongoing inadequate PO intake Fluctuating AMS Labs improving  Palliative Medicine consulted for ongoing assistance with goals of care conversations.  Subjective: Extensive chart review has been completed prior to meeting patient including labs, vital signs, imaging, progress notes, orders, and available advanced directive documents from current and previous encounters.    Visited with [name] at her bedside. Awake and alert, pleasantly confused. Mitts in place. Denies acute pain or discomfort. No family at bedside during time of visit  Called and spoke with Alan/nephew. Confirmed family meeting (several family members traveling from out of state) for Wednesday at 4pm-hospice also planning on attending meeting. Explained medical team preparing for discharge and without family decision at this point proceeding with PT/OT referral. [name] expressed understanding. PMT to touch base tomorrow and plan for attending  meeting Wednesday.   Objective:  Physical Exam Constitutional:      General: She is not in acute distress.    Appearance: She is ill-appearing.  Pulmonary:     Effort: Pulmonary effort is normal. No respiratory distress.  Abdominal:     Palpations: Abdomen is soft.     Tenderness: There is no abdominal tenderness.  Musculoskeletal:     Right lower leg: Edema present.     Left lower leg: Edema present.  Skin:    General: Skin is warm and dry.  Neurological:     Mental Status: She is alert.             Vital Signs: BP 129/80 (BP Location: Right Arm)   Pulse 70   Temp 98.1 F (36.7 C) (Oral)  Resp 18   Ht '4\' 11"'$  (1.499 m)   Wt 50 kg   SpO2 94%   BMI 22.26 kg/m  SpO2: SpO2: 94 % O2 Device: O2 Device: Room Air O2 Flow Rate:     Palliative Care Assessment & Plan   Assessment/Recommendation/Plan  DNR Family meeting Wednesday 1/24 4pm--GOC/hospice PMT to continue to provide ongoing support  Plan discussed with primary medical team.  Thank you for allowing the Palliative Medicine Team to assist in the care of this patient.  Total time:  35 minutes  Greater than 50%  of this time was spent counseling and coordinating care related to the above assessment and plan.  Theodoro Grist, DNP, AGNP-C Palliative Medicine   Please contact Palliative Medicine Team phone at [phone] for questions and concerns.

## 2022-11-20 NOTE — Care Management Important Message (Signed)
Important Message  Patient Details  Name: MEAGEN LIMONES MRN: [id number] Date of Birth: 1932-09-04   Medicare Important Message Given:  Yes     Shelda Altes 11/20/2022, 10:48 AM

## 2022-11-20 NOTE — Social Work (Signed)
CSW called patient's nephew. Left voice message to return call.   [facility] confirmed patient is from their facility- CSW will need to confirm with family disposition plan at discharge.  Thurmond Butts, MSW, LCSW Clinical Social Worker

## 2022-11-20 NOTE — Progress Notes (Signed)
PROGRESS NOTE    [name]  TWS:[id number] DOB: November 07, 1931 DOA: 11/15/2022 PCP: Lauree Chandler, NP    Brief Narrative:  89 year old with history of dementia, chronic diastolic heart failure, CKD stage IIIb, COPD, hypertension and GERD, recently transition to nursing home sent to the ER with hypernatremia.  Patient was not able to give any history.  She has been less interactive since last few days and family requested to be evaluated. Sodium was 160 at the nursing home so sent to ER.   Assessment & Plan:   Hyperchloremic hypernatremia, severe dehydration due to inadequate water intake Acute kidney injury with underlying chronic kidney disease stage IIIb: Baseline creatinine 1.6.  Presented with sodium 163, chloride 129, creatinine 3.1.  Patient with no adequate free water intake. Aggressively treated with IV fluids with improvement of sodium and renal functions to her usual levels. Urine output is adequate.  See goal of care discussion below. Will continue to decrease rate of dextrose infusion.  Hypokalemia: Replaced.  Hypercalcemia secondary to dehydration: IV fluids.  Improving.  Chronic diastolic heart failure: Discontinue diuretics.  Dementia with behavioral disturbances: Zyprexa 5 mg at night as at nursing home.  Delirium precautions.  Fall precautions.  COPD: Stable.  Goal of care: Followed by palliative care.  Currently DNR.   Palliative and hospice discussion ongoing. Remains very frail debilitated without adequate oral intake.   Hospice level of care recommended, family is undecided today. Continue monitor for oral intake.  Will consult PT OT today.  If family does not decide on hospice, will send back to [facility] with PT OT.   DVT prophylaxis: heparin injection 5,000 Units Start: 11/16/22 0600   Code Status: DNR. Family Communication: None today. Disposition Plan: Status is: Inpatient Remains inpatient appropriate because: Severe hypernatremia, unsafe  discharge disposition.  Working on hospice referral.     Consultants:  Palliative care  Procedures:  None  Antimicrobials:  None   Subjective:  Seen and examined.  More alert and awake.  Answers few basic questions.  She also kept 2 way communication today asking " how are you" to me. Pleasant.  Answers few basic questions but confused. Nursing staff reported she even did not eat a bite from all 3 meals yesterday.  Objective: Vitals:   11/19/22 2042 11/20/22 0539 11/20/22 0754 11/20/22 0829  BP: (!) 154/77 (!) 159/90 (!) 143/76   Pulse: 89 76 69   Resp: '20 18 17   '$ Temp: (!) 100.4 F (38 C) 98.1 F (36.7 C) 97.8 F (36.6 C)   TempSrc: Oral Axillary Oral   SpO2: 100% 100% 99% 96%  Weight:      Height:        Intake/Output Summary (Last 24 hours) at 11/20/2022 1128 Last data filed at 11/20/2022 0801 Gross per 24 hour  Intake 1462.62 ml  Output 1650 ml  Net -187.38 ml    Filed Weights   11/15/22 1933 11/16/22 1950 11/17/22 0850  Weight: 66 kg 50 kg 50 kg    Examination:  General: Frail debilitated.  Alert and awake but not oriented.  Answers few basic questions. Mucous membranes are dry. Cardiovascular: S1-S2 normal.  Regular rate rhythm. Respiratory: Bilateral clear. Gastrointestinal: Soft.  Nontender.  Bowel sound present. Ext: Nontender.  No edema.  No cyanosis.  Data Reviewed: I have personally reviewed following labs and imaging studies  CBC: Recent Labs  Lab 11/15/22 2138 11/16/22 0520  WBC 7.3 6.2  NEUTROABS 5.0  --   HGB 12.4 10.9*  HCT 45.1 38.1  MCV 94.2 91.1  PLT 257 768   Basic Metabolic Panel: Recent Labs  Lab 11/15/22 2138 11/16/22 0520 11/17/22 0359 11/17/22 1653 11/18/22 0653 11/19/22 0109 11/20/22 0048  NA 163*   < > 155* 148* 142 140 139  K 4.2   < > 3.5 3.1* 3.3* 3.3* 4.2  CL 129*   < > 123* 116* 111 110 110  CO2 16*   < > 20* 24 22 21* 24  GLUCOSE 86   < > 75 155* 177* 154* 109*  BUN 74*   < > 56* 47* 39* 30* 25*   CREATININE 3.10*   < > 2.31* 2.16* 1.93* 1.78* 1.82*  CALCIUM 11.0*   < > 10.4* 10.2 10.3 10.3 10.3  MG 2.8*  --   --   --   --   --   --    < > = values in this interval not displayed.   GFR: Estimated Creatinine Clearance: 14.6 mL/min (A) (by C-G formula based on SCr of 1.82 mg/dL (H)). Liver Function Tests: Recent Labs  Lab 11/15/22 2138 11/16/22 0520  AST 23 23  ALT 12 10  ALKPHOS 69 61  BILITOT 0.8 0.6  PROT 8.8* 7.4  ALBUMIN 3.0* 2.6*   No results for input(s): "LIPASE", "AMYLASE" in the last 168 hours. No results for input(s): "AMMONIA" in the last 168 hours. Coagulation Profile: No results for input(s): "INR", "PROTIME" in the last 168 hours. Cardiac Enzymes: No results for input(s): "CKTOTAL", "CKMB", "CKMBINDEX", "TROPONINI" in the last 168 hours. BNP (last 3 results) No results for input(s): "PROBNP" in the last 8760 hours. HbA1C: No results for input(s): "HGBA1C" in the last 72 hours. CBG: Recent Labs  Lab 11/16/22 1053  GLUCAP 177*   Lipid Profile: No results for input(s): "CHOL", "HDL", "LDLCALC", "TRIG", "CHOLHDL", "LDLDIRECT" in the last 72 hours. Thyroid Function Tests: No results for input(s): "TSH", "T4TOTAL", "FREET4", "T3FREE", "THYROIDAB" in the last 72 hours.  Anemia Panel: No results for input(s): "VITAMINB12", "FOLATE", "FERRITIN", "TIBC", "IRON", "RETICCTPCT" in the last 72 hours. Sepsis Labs: Recent Labs  Lab 11/15/22 2138 11/16/22 2006  LATICACIDVEN 1.8 1.8    No results found for this or any previous visit (from the past 240 hour(s)).       Radiology Studies: No results found.      Scheduled Meds:  heparin  5,000 Units Subcutaneous Q8H   mometasone-formoterol  2 puff Inhalation BID   OLANZapine  5 mg Oral QHS   pantoprazole  40 mg Oral Daily   potassium chloride  20 mEq Oral BID   sodium chloride flush  3 mL Intravenous Q12H   Continuous Infusions:  dextrose 50 mL/hr at 11/20/22 0738     LOS: 5 days    Time  spent: 35 minutes     [name] Merino, MD [facility] Pager [phone]

## 2022-11-21 DIAGNOSIS — E870 Hyperosmolality and hypernatremia: Secondary | ICD-10-CM | POA: Diagnosis not present

## 2022-11-21 NOTE — TOC Progression Note (Signed)
Transition of Care [facility]) - Progression Note    Patient Details  Name: [name] MRN: [id number] Date of Birth: 02/14/1932  [facility] CM/SW [place], [place] Phone Number: 11/21/2022, 11:29 AM  Clinical Narrative:     CSW spoke with patient's son, [name], - CSW introduced self and explained role. He confirmed plan that family will meet with [facility] tomorrow for [place]- He states he anticipates hospice in [facility] but final decision will be made tomorrow.   TOC will continue to follow and assist with discharge planning.  Thurmond Butts, MSW, LCSW Clinical Social Worker                                               Social Determinants of Health (SDOH) Interventions SDOH Screenings   Food Insecurity: No Food Insecurity (10/20/2022)  Housing: Low Risk  (10/20/2022)  Transportation Needs: No Transportation Needs (10/20/2022)  Utilities: Not At Risk (10/20/2022)  Alcohol Screen: Low Risk  (06/14/2018)  Depression (PHQ2-9): Low Risk  (01/27/2022)  Financial Resource Strain: Low Risk  (03/29/2018)  Physical Activity: Insufficiently Active (03/29/2018)  Social Connections: Somewhat Isolated (03/29/2018)  Stress: No Stress Concern Present (03/29/2018)  Tobacco Use: Low Risk  (11/15/2022)    Readmission Risk Interventions    10/24/2022   12:29 PM  Readmission Risk Prevention Plan  Transportation Screening Complete  PCP or Specialist Appt within 5-7 Days Complete  Home Care Screening Complete  Medication Review (RN CM) Complete

## 2022-11-21 NOTE — Progress Notes (Signed)
PROGRESS NOTE    [name]  AYT:[id number] DOB: August 19, 1932 DOA: 11/15/2022 PCP: Lauree Chandler, NP    Brief Narrative:  89 year old with history of dementia, chronic diastolic heart failure, CKD stage IIIb, COPD, hypertension and GERD, recently transition to nursing home sent to the ER with hypernatremia.  Patient was not able to give any history.  She has been less interactive since last few days and family requested to be evaluated. Sodium was 160 at the nursing home so sent to ER.   Assessment & Plan:   Hyperchloremic hypernatremia, severe dehydration due to inadequate water intake Acute kidney injury with underlying chronic kidney disease stage IIIb: Baseline creatinine 1.6.  Presented with sodium 163, chloride 129, creatinine 3.1.  Patient with no adequate free water intake. Aggressively treated with IV fluids with improvement of sodium and renal functions to her usual levels. Urine output is adequate.  See goal of care discussion below. Will continue to decrease rate of dextrose infusion at 40 ml/hr.  Hypokalemia: Replaced and adequate.   Hypercalcemia secondary to dehydration: IV fluids.  Improving.  Chronic diastolic heart failure: Discontinue diuretics.  Dementia with behavioral disturbances: Zyprexa 5 mg at night as at nursing home.  Delirium precautions.  Fall precautions.  COPD: Stable.  Goal of care: Followed by palliative care.  Currently DNR.   Palliative and hospice discussion ongoing. Remains very frail debilitated without adequate oral intake.   Hospice level of care recommended, family is meeting with [facility] [place] tomorrow 4 PM. Continue monitor for oral intake.  Work with PT OT.  If able to keep up with some oral intake, can transfer back to a SNF and hospice meeting can happen at a SNF.  DVT prophylaxis: heparin injection 5,000 Units Start: 11/16/22 0600   Code Status: DNR. Family Communication: None today. Disposition Plan: Status is:  Inpatient Remains inpatient appropriate because: Severe hypernatremia, unsafe discharge disposition.  Working on hospice placement.     Consultants:  Palliative care  Procedures:  None  Antimicrobials:  None   Subjective:  Seen and examined.  Alert awake but confused.  Does not eat.  Reported eating few bites only.  She tells me she has some back pain and would like to use something.  Offered Tylenol and she agreed.  No family at bedside.  Objective: Vitals:   11/21/22 0348 11/21/22 0759 11/21/22 0802 11/21/22 1100  BP: (!) 107/51 (!) 157/67 (!) 145/67 (!) 132/58  Pulse: 64 69 69 69  Resp: '18 17 18 18  '$ Temp: 98.1 F (36.7 C) 98.4 F (36.9 C) 97.7 F (36.5 C) 98.4 F (36.9 C)  TempSrc: Axillary Axillary Oral Oral  SpO2: 100% 100% 100% 98%  Weight:      Height:        Intake/Output Summary (Last 24 hours) at 11/21/2022 1320 Last data filed at 11/21/2022 0407 Gross per 24 hour  Intake 1092.01 ml  Output 700 ml  Net 392.01 ml     Filed Weights   11/15/22 1933 11/16/22 1950 11/17/22 0850  Weight: 66 kg 50 kg 50 kg    Examination:  General: Frail .  Alert and awake but not oriented.  Answers basic questions appropriately.  She is well composed and calm today. Cardiovascular: S1-S2 normal.  Regular rate rhythm. Respiratory: Bilateral clear. Gastrointestinal: Soft.  Nontender.  Bowel sound present. Ext: Nontender.  No edema.  No cyanosis.  Data Reviewed: I have personally reviewed following labs and imaging studies  CBC: Recent Labs  Lab 11/15/22 2138 11/16/22 0520  WBC 7.3 6.2  NEUTROABS 5.0  --   HGB 12.4 10.9*  HCT 45.1 38.1  MCV 94.2 91.1  PLT 257 209    Basic Metabolic Panel: Recent Labs  Lab 11/15/22 2138 11/16/22 0520 11/17/22 0359 11/17/22 1653 11/18/22 0653 11/19/22 0109 11/20/22 0048  NA 163*   < > 155* 148* 142 140 139  K 4.2   < > 3.5 3.1* 3.3* 3.3* 4.2  CL 129*   < > 123* 116* 111 110 110  CO2 16*   < > 20* 24 22 21* 24   GLUCOSE 86   < > 75 155* 177* 154* 109*  BUN 74*   < > 56* 47* 39* 30* 25*  CREATININE 3.10*   < > 2.31* 2.16* 1.93* 1.78* 1.82*  CALCIUM 11.0*   < > 10.4* 10.2 10.3 10.3 10.3  MG 2.8*  --   --   --   --   --   --    < > = values in this interval not displayed.    GFR: Estimated Creatinine Clearance: 14.6 mL/min (A) (by C-G formula based on SCr of 1.82 mg/dL (H)). Liver Function Tests: Recent Labs  Lab 11/15/22 2138 11/16/22 0520  AST 23 23  ALT 12 10  ALKPHOS 69 61  BILITOT 0.8 0.6  PROT 8.8* 7.4  ALBUMIN 3.0* 2.6*    No results for input(s): "LIPASE", "AMYLASE" in the last 168 hours. No results for input(s): "AMMONIA" in the last 168 hours. Coagulation Profile: No results for input(s): "INR", "PROTIME" in the last 168 hours. Cardiac Enzymes: No results for input(s): "CKTOTAL", "CKMB", "CKMBINDEX", "TROPONINI" in the last 168 hours. BNP (last 3 results) No results for input(s): "PROBNP" in the last 8760 hours. HbA1C: No results for input(s): "HGBA1C" in the last 72 hours. CBG: Recent Labs  Lab 11/16/22 1053  GLUCAP 177*    Lipid Profile: No results for input(s): "CHOL", "HDL", "LDLCALC", "TRIG", "CHOLHDL", "LDLDIRECT" in the last 72 hours. Thyroid Function Tests: No results for input(s): "TSH", "T4TOTAL", "FREET4", "T3FREE", "THYROIDAB" in the last 72 hours.  Anemia Panel: No results for input(s): "VITAMINB12", "FOLATE", "FERRITIN", "TIBC", "IRON", "RETICCTPCT" in the last 72 hours. Sepsis Labs: Recent Labs  Lab 11/15/22 2138 11/16/22 2006  LATICACIDVEN 1.8 1.8     No results found for this or any previous visit (from the past 240 hour(s)).       Radiology Studies: No results found.      Scheduled Meds:  heparin  5,000 Units Subcutaneous Q8H   mometasone-formoterol  2 puff Inhalation BID   OLANZapine  5 mg Oral QHS   pantoprazole  40 mg Oral Daily   sodium chloride flush  3 mL Intravenous Q12H   Continuous Infusions:  dextrose 40 mL/hr  at 11/21/22 0556     LOS: 6 days    Time spent: 35 minutes     [name] Merino, MD [facility] Pager [phone]

## 2022-11-21 NOTE — Evaluation (Signed)
Occupational Therapy Evaluation Patient Details Name: [name] MRN: [id number] DOB: [birth year] Today's Date: 11/21/2022   History of Present Illness Pt is 89 year old presented to [facility] on  [facility]15/22 with hypernatremia due to severe dehydration due to inadequate water intake. Pt also with AKI.  PMH - dementia, chf, ckd, copd, htn   Clinical Impression   Patient admitted for above and presents with problem list below.  She is from SNF and per chart review of last admission Dec 2023, pt able to self feed and groom, needing assist for bathing/dressing and transfers. Pt unable to provide history of PLOF since SNF.  Currently requires min-max assist for feeding/grooming, total assist to reposition into chair position.  She is oriented to self only, follows simple commands with increased time, pleasantly confused. Based on performance today, will follow acutely to optimize independence and decrease burden of care pending palliative/hospice meeting on Wed with family.      Recommendations for follow up therapy are one component of a multi-disciplinary discharge planning process, led by the attending physician.  Recommendations may be updated based on patient status, additional functional criteria and insurance authorization.   Follow Up Recommendations  Skilled nursing-short term rehab (<3 hours/day)     Assistance Recommended at Discharge Frequent or constant Supervision/Assistance  Patient can return home with the following A lot of help with bathing/dressing/bathroom;Direct supervision/assist for medications management;Direct supervision/assist for financial management;Assistance with cooking/housework;Assist for transportation;A lot of help with walking and/or transfers;Help with stairs or ramp for entrance;Assistance with feeding    Functional Status Assessment  Patient has had a recent decline in their functional status and demonstrates the ability to make significant improvements in  function in a reasonable and predictable amount of time.  Equipment Recommendations  Other (comment) (defer)    Recommendations for Other Services       Precautions / Restrictions Precautions Precautions: Fall Restrictions Weight Bearing Restrictions: No      Mobility Bed Mobility Overal bed mobility: Needs Assistance             General bed mobility comments: total assist to reposition upright in chair position    Transfers                          Balance Overall balance assessment: Needs assistance     Sitting balance - Comments: supported sitting in chair positoin with L lateral lean                                   ADL either performed or assessed with clinical judgement   ADL Overall ADL's : Needs assistance/impaired Eating/Feeding: Maximal assistance;Minimal assistance;Bed level Eating/Feeding Details (indicate cue type and reason): chair positoin in bed, pt using B hands to manage cup with min fading to supervision; unable manage banana requiring max assist Grooming: Wash/dry face;Minimal assistance Grooming Details (indicate cue type and reason): min assist for thoroughness             Lower Body Dressing: Total assistance     Toilet Transfer Details (indicate cue type and reason): deferred                 Vision   Additional Comments: further assessment required     Perception     Praxis      Pertinent Vitals/Pain Pain Assessment Pain Assessment: Faces Faces Pain Scale: Hurts a little  bit Pain Location: back Pain Descriptors / Indicators: Discomfort Pain Intervention(s): Limited activity within patient's tolerance, Monitored during session, Repositioned     Hand Dominance Right   Extremity/Trunk Assessment Upper Extremity Assessment Upper Extremity Assessment: RUE deficits/detail;LUE deficits/detail;Difficult to assess due to impaired cognition RUE Deficits / Details: generalized weakness, grasp 3/5  but noted limited elbow extension (? chronic) and shoulder ROM AAROM to 90* RUE Coordination: decreased fine motor;decreased gross motor LUE Deficits / Details: generalized weakess, grasp 3/5 with 3/5 shoulder and elbow. LUE Coordination: decreased fine motor;decreased gross motor   Lower Extremity Assessment Lower Extremity Assessment: Defer to PT evaluation   Cervical / Trunk Assessment Cervical / Trunk Assessment: Kyphotic   Communication Communication Communication: No difficulties   Cognition Arousal/Alertness: Awake/alert Behavior During Therapy: WFL for tasks assessed/performed Overall Cognitive Status: History of cognitive impairments - at baseline                                 General Comments: pt with hx of dementia, she follow simple commands with increased time.  Oriented to self only.     General Comments       Exercises     Shoulder Instructions      Home Living Family/patient expects to be discharged to:: Skilled nursing facility                                        Prior Functioning/Environment Prior Level of Function : Patient poor historian/Family not available             Mobility Comments: During hospitalization around Christmas pt was initially ambulatory with Therapy but declined and was not ambulating at DC to SNF. Unknown progress/status at SNF ADLs Comments: during hospitalization 12/23 pt able to self feed and groom, mod assist for transfers and total assist for LB. Pt unable to report level of assist since at SNF.        OT Problem List: Decreased strength;Decreased range of motion;Decreased activity tolerance;Impaired balance (sitting and/or standing);Decreased cognition;Decreased safety awareness;Decreased knowledge of use of DME or AE;Decreased coordination      OT Treatment/Interventions: Self-care/ADL training;Therapeutic exercise;Therapeutic activities;Cognitive remediation/compensation;Energy  conservation;Patient/family education;DME and/or AE instruction;Balance training    OT Goals(Current goals can be found in the care plan section) Acute Rehab OT Goals Patient Stated Goal: none stated OT Goal Formulation: Patient unable to participate in goal setting Time For Goal Achievement: 12/05/22 Potential to Achieve Goals: Fair  OT Frequency: Min 2X/week    Co-evaluation              AM-PAC OT "6 Clicks" Daily Activity     Outcome Measure Help from another person eating meals?: A Lot Help from another person taking care of personal grooming?: A Lot Help from another person toileting, which includes using toliet, bedpan, or urinal?: Total Help from another person bathing (including washing, rinsing, drying)?: A Lot Help from another person to put on and taking off regular upper body clothing?: A Lot Help from another person to put on and taking off regular lower body clothing?: Total 6 Click Score: 10   End of Session Nurse Communication: Mobility status  Activity Tolerance: Patient limited by fatigue Patient left: in bed;with call bell/phone within reach;with bed alarm set;with nursing/sitter in room  OT Visit Diagnosis: Other abnormalities of gait and mobility (R26.89);Muscle  weakness (generalized) (M62.81);Other symptoms and signs involving cognitive function                Time: 6834-1962 OT Time Calculation (min): 22 min Charges:  OT General Charges $OT Visit: 1 Visit OT Evaluation $OT Eval Moderate Complexity: 1 Mod  Jolaine Artist, OT Acute Rehabilitation Services Office [phone]   Delight Stare 11/21/2022, 9:46 AM

## 2022-11-22 DIAGNOSIS — E870 Hyperosmolality and hypernatremia: Secondary | ICD-10-CM | POA: Diagnosis not present

## 2022-11-22 DIAGNOSIS — Z515 Encounter for palliative care: Secondary | ICD-10-CM | POA: Diagnosis not present

## 2022-11-22 DIAGNOSIS — N179 Acute kidney failure, unspecified: Secondary | ICD-10-CM | POA: Diagnosis not present

## 2022-11-22 DIAGNOSIS — F03918 Unspecified dementia, unspecified severity, with other behavioral disturbance: Secondary | ICD-10-CM | POA: Diagnosis not present

## 2022-11-22 NOTE — Progress Notes (Signed)
Pt got agitated. Refused the heparin sq shot. MD notified.  Lavenia Atlas, RN

## 2022-11-22 NOTE — Progress Notes (Signed)
Daily Progress Note   Patient Name: [name]      Date: 11/22/2022 DOB: 1932/10/17  Age: 89 y.o. MRN#: [id number] Attending Physician: [name] Merino, MD Primary Care Physician: Lauree Chandler, NP Admit Date: 11/15/2022  Reason for Consultation/Follow-up: Establishing goals of care  HPI/Brief Hospital Review: 89 y.o. female  with past medical history of dementia, chronic diastolic heart failure, CKD stage IIIb, COPD, and hypertension.  Last hospitalization occurring end of December 2023 for CHF exacerbation and pneumonia.  At that time discharged to [facility] skilled nursing facility for acute rehab. Admitted on 11/15/2022 with hyperchloremia and hypernatremia due to severe dehydration likely due to inadequate water intake.  Also noted acute on chronic kidney disease.   1/24 Ongoing inadequate PO intake Fluctuating AMS    Palliative Medicine consulted for ongoing assistance with goals of care conversations.  Subjective: Extensive chart review has been completed prior to meeting patient including labs, vital signs, imaging, progress notes, orders, and available advanced directive documents from current and previous encounters.    Met with Ms. Riggin at her bedside. Awake, pleasantly confused. Denies acute complaints or concerns.  Met with Alan/nephew, Rachel/niece and other family in conference room. Family able to review most recent health events and explain they have all noticed a fairly significant decline in her overall health and functional status. Family able to recognize significant decline and do not wish to allow Ms. [place] to suffer. Reviewed poor nutritional status over the last several days, not meeting nutritional demands-verbalize understanding. Intake has been  minimal to none since admission.  Althea Grimmer and other family feel it is appropriate and desire Hospice care. Unfortunately Hospice liaison unavailable to meet today but will plan to meet with family tomorrow morning to review eligibility criteria. Family interested in residential hospice placement at [facility].  PMT to continue to follow for ongoing support and coordination of care.   Objective:       Vital Signs: BP (!) 112/45 (BP Location: Right Arm)   Pulse 62   Temp (!) 97.5 F (36.4 C) (Axillary)   Resp 16   Ht '4\' 11"'$  (1.499 m)   Wt 50 kg   SpO2 99%   BMI 22.26 kg/m  SpO2: SpO2: 99 % O2 Device: O2 Device: Room Air O2 Flow Rate:  Palliative Care Assessment & Plan   Assessment/Recommendation/Plan  DNR Likely transition to Hospice care 1/25 PMT to continue to follow for ongoing needs   Thank you for allowing the Palliative Medicine Team to assist in the care of this patient.  Total time:  35 minutes  Greater than 50%  of this time was spent counseling and coordinating care related to the above assessment and plan.  Theodoro Grist, DNP, AGNP-C Palliative Medicine   Please contact Palliative Medicine Team phone at [phone] for questions and concerns.

## 2022-11-22 NOTE — Progress Notes (Signed)
PROGRESS NOTE    [name]  YKZ:[id number] DOB: 05/09/1932 DOA: 11/15/2022 PCP: Lauree Chandler, NP    Brief Narrative:  89 year old with history of dementia, chronic diastolic heart failure, CKD stage IIIb, COPD, hypertension and GERD, recently transition to nursing home sent to the ER with hypernatremia.  Patient was not able to give any history.  She has been less interactive since last few days and family requested to be evaluated. Sodium was 160 at the nursing home so sent to ER.   Assessment & Plan:   Hyperchloremic hypernatremia, severe dehydration due to inadequate water intake Acute kidney injury with underlying chronic kidney disease stage IIIb: Baseline creatinine 1.6.  Presented with sodium 163, chloride 129, creatinine 3.1.  Patient with no adequate free water intake. Aggressively treated with IV fluids with improvement of sodium and renal functions to her usual levels. Urine output is adequate.  See goal of care discussion below. on dextrose infusion at 40 ml/hr.  Hypokalemia: Replaced and adequate.   Hypercalcemia secondary to dehydration: IV fluids.  Improving.  Chronic diastolic heart failure: Discontinue diuretics.  Dementia with behavioral disturbances: Zyprexa 5 mg at night as at nursing home.  Delirium precautions.  Fall precautions.  COPD: Stable.  Goal of care: Followed by palliative care.  Currently DNR.   Palliative and hospice discussion ongoing. Remains very frail debilitated without adequate oral intake.   Hospice level of care recommended, family is meeting with [facility] [place] today at 4 PM. Appropriate for inpatient hospice.   DVT prophylaxis: heparin injection 5,000 Units Start: 11/16/22 0600   Code Status: DNR. Family Communication: None today. Disposition Plan: Status is: Inpatient Remains inpatient appropriate because: Severe hypernatremia, unsafe discharge disposition.  Working on hospice placement.     Consultants:   Palliative care  Procedures:  None  Antimicrobials:  None   Subjective:  Seen and examined. Looks comfortable. Nurse reported not eating at all all day yesterday.   Objective: Vitals:   11/21/22 2343 11/22/22 0433 11/22/22 0745 11/22/22 1153  BP: 118/64 (!) 122/59 132/70 127/76  Pulse: 65 65 66 60  Resp: '20 20 18 17  '$ Temp: 99.1 F (37.3 C) 99 F (37.2 C) 97.9 F (36.6 C) 97.9 F (36.6 C)  TempSrc: Oral Oral  Axillary  SpO2: 100% 95% 99% 96%  Weight:      Height:        Intake/Output Summary (Last 24 hours) at 11/22/2022 1154 Last data filed at 11/22/2022 0900 Gross per 24 hour  Intake 1175.72 ml  Output 1450 ml  Net -274.28 ml     Filed Weights   11/15/22 1933 11/16/22 1950 11/17/22 0850  Weight: 66 kg 50 kg 50 kg    Examination:  General: Frail .  Mostly sleepy. Answers basic questions appropriately.  She is well composed and calm today. Cardiovascular: S1-S2 normal.  Regular rate rhythm. Respiratory: Bilateral clear. Gastrointestinal: Soft.  Nontender.  Bowel sound present. Ext: Nontender.  No edema.  No cyanosis.  Data Reviewed: I have personally reviewed following labs and imaging studies  CBC: Recent Labs  Lab 11/15/22 2138 11/16/22 0520  WBC 7.3 6.2  NEUTROABS 5.0  --   HGB 12.4 10.9*  HCT 45.1 38.1  MCV 94.2 91.1  PLT 257 939    Basic Metabolic Panel: Recent Labs  Lab 11/15/22 2138 11/16/22 0520 11/17/22 0359 11/17/22 1653 11/18/22 0653 11/19/22 0109 11/20/22 0048  NA 163*   < > 155* 148* 142 140 139  K 4.2   < >  3.5 3.1* 3.3* 3.3* 4.2  CL 129*   < > 123* 116* 111 110 110  CO2 16*   < > 20* 24 22 21* 24  GLUCOSE 86   < > 75 155* 177* 154* 109*  BUN 74*   < > 56* 47* 39* 30* 25*  CREATININE 3.10*   < > 2.31* 2.16* 1.93* 1.78* 1.82*  CALCIUM 11.0*   < > 10.4* 10.2 10.3 10.3 10.3  MG 2.8*  --   --   --   --   --   --    < > = values in this interval not displayed.    GFR: Estimated Creatinine Clearance: 14.6 mL/min (A) (by  C-G formula based on SCr of 1.82 mg/dL (H)). Liver Function Tests: Recent Labs  Lab 11/15/22 2138 11/16/22 0520  AST 23 23  ALT 12 10  ALKPHOS 69 61  BILITOT 0.8 0.6  PROT 8.8* 7.4  ALBUMIN 3.0* 2.6*    No results for input(s): "LIPASE", "AMYLASE" in the last 168 hours. No results for input(s): "AMMONIA" in the last 168 hours. Coagulation Profile: No results for input(s): "INR", "PROTIME" in the last 168 hours. Cardiac Enzymes: No results for input(s): "CKTOTAL", "CKMB", "CKMBINDEX", "TROPONINI" in the last 168 hours. BNP (last 3 results) No results for input(s): "PROBNP" in the last 8760 hours. HbA1C: No results for input(s): "HGBA1C" in the last 72 hours. CBG: Recent Labs  Lab 11/16/22 1053  GLUCAP 177*    Lipid Profile: No results for input(s): "CHOL", "HDL", "LDLCALC", "TRIG", "CHOLHDL", "LDLDIRECT" in the last 72 hours. Thyroid Function Tests: No results for input(s): "TSH", "T4TOTAL", "FREET4", "T3FREE", "THYROIDAB" in the last 72 hours.  Anemia Panel: No results for input(s): "VITAMINB12", "FOLATE", "FERRITIN", "TIBC", "IRON", "RETICCTPCT" in the last 72 hours. Sepsis Labs: Recent Labs  Lab 11/15/22 2138 11/16/22 2006  LATICACIDVEN 1.8 1.8     No results found for this or any previous visit (from the past 240 hour(s)).       Radiology Studies: No results found.      Scheduled Meds:  heparin  5,000 Units Subcutaneous Q8H   mometasone-formoterol  2 puff Inhalation BID   OLANZapine  5 mg Oral QHS   pantoprazole  40 mg Oral Daily   sodium chloride flush  3 mL Intravenous Q12H   Continuous Infusions:  dextrose 40 mL/hr at 11/22/22 0628     LOS: 7 days    Time spent: 35 minutes     [name] Merino, MD [facility] Pager [phone]

## 2022-11-23 DIAGNOSIS — M6281 Muscle weakness (generalized): Secondary | ICD-10-CM | POA: Diagnosis not present

## 2022-11-23 DIAGNOSIS — M810 Age-related osteoporosis without current pathological fracture: Secondary | ICD-10-CM | POA: Diagnosis not present

## 2022-11-23 DIAGNOSIS — J189 Pneumonia, unspecified organism: Secondary | ICD-10-CM | POA: Diagnosis not present

## 2022-11-23 DIAGNOSIS — F03911 Unspecified dementia, unspecified severity, with agitation: Secondary | ICD-10-CM | POA: Diagnosis not present

## 2022-11-23 DIAGNOSIS — R531 Weakness: Secondary | ICD-10-CM | POA: Diagnosis not present

## 2022-11-23 DIAGNOSIS — I5043 Acute on chronic combined systolic (congestive) and diastolic (congestive) heart failure: Secondary | ICD-10-CM | POA: Diagnosis not present

## 2022-11-23 DIAGNOSIS — R296 Repeated falls: Secondary | ICD-10-CM | POA: Diagnosis not present

## 2022-11-23 DIAGNOSIS — N1832 Chronic kidney disease, stage 3b: Secondary | ICD-10-CM | POA: Diagnosis not present

## 2022-11-23 DIAGNOSIS — Z7401 Bed confinement status: Secondary | ICD-10-CM | POA: Diagnosis not present

## 2022-11-23 DIAGNOSIS — J449 Chronic obstructive pulmonary disease, unspecified: Secondary | ICD-10-CM | POA: Diagnosis not present

## 2022-11-23 DIAGNOSIS — I5022 Chronic systolic (congestive) heart failure: Secondary | ICD-10-CM | POA: Diagnosis not present

## 2022-11-23 DIAGNOSIS — R278 Other lack of coordination: Secondary | ICD-10-CM | POA: Diagnosis not present

## 2022-11-23 DIAGNOSIS — R293 Abnormal posture: Secondary | ICD-10-CM | POA: Diagnosis not present

## 2022-11-23 DIAGNOSIS — D472 Monoclonal gammopathy: Secondary | ICD-10-CM | POA: Diagnosis not present

## 2022-11-23 DIAGNOSIS — F0393 Unspecified dementia, unspecified severity, with mood disturbance: Secondary | ICD-10-CM | POA: Diagnosis not present

## 2022-11-23 DIAGNOSIS — E860 Dehydration: Secondary | ICD-10-CM | POA: Diagnosis not present

## 2022-11-23 DIAGNOSIS — J309 Allergic rhinitis, unspecified: Secondary | ICD-10-CM | POA: Diagnosis not present

## 2022-11-23 DIAGNOSIS — I5032 Chronic diastolic (congestive) heart failure: Secondary | ICD-10-CM | POA: Diagnosis not present

## 2022-11-23 DIAGNOSIS — K219 Gastro-esophageal reflux disease without esophagitis: Secondary | ICD-10-CM | POA: Diagnosis not present

## 2022-11-23 DIAGNOSIS — R627 Adult failure to thrive: Secondary | ICD-10-CM | POA: Insufficient documentation

## 2022-11-23 DIAGNOSIS — E870 Hyperosmolality and hypernatremia: Secondary | ICD-10-CM | POA: Diagnosis not present

## 2022-11-23 DIAGNOSIS — R41841 Cognitive communication deficit: Secondary | ICD-10-CM | POA: Diagnosis not present

## 2022-11-23 DIAGNOSIS — G9341 Metabolic encephalopathy: Secondary | ICD-10-CM | POA: Diagnosis not present

## 2022-11-23 DIAGNOSIS — E611 Iron deficiency: Secondary | ICD-10-CM | POA: Diagnosis not present

## 2022-11-23 DIAGNOSIS — R1312 Dysphagia, oropharyngeal phase: Secondary | ICD-10-CM | POA: Diagnosis not present

## 2022-11-23 DIAGNOSIS — N183 Chronic kidney disease, stage 3 unspecified: Secondary | ICD-10-CM | POA: Diagnosis not present

## 2022-11-23 DIAGNOSIS — J441 Chronic obstructive pulmonary disease with (acute) exacerbation: Secondary | ICD-10-CM | POA: Diagnosis not present

## 2022-11-23 DIAGNOSIS — I130 Hypertensive heart and chronic kidney disease with heart failure and stage 1 through stage 4 chronic kidney disease, or unspecified chronic kidney disease: Secondary | ICD-10-CM | POA: Diagnosis not present

## 2022-11-23 NOTE — TOC Transition Note (Signed)
Transition of Care [facility]) - CM/SW Discharge Note   Patient Details  Name: [name] MRN: [id number] Date of Birth: 1932-01-31  [facility]) CM/SW Contact:  Vinie Sill, LCSW Phone Number: 11/23/2022, 3:01 PM   Clinical Narrative:     Patient will Discharge to: [facility]  Discharge Date: 11/23/22 Family Notified: Nephew  Transport By: Corey Harold  Per MD patient is ready for discharge. RN, patient, and facility notified of discharge. Discharge Summary sent to facility. RN given number for [phone]. Ambulance transport requested for patient.   Clinical Social Worker signing off.  Thurmond Butts, MSW, LCSW Clinical Social Worker      Barriers to Discharge: Barriers Resolved   Patient Goals and CMS Choice CMS Medicare.gov Compare Post Acute Care list provided to:: Patient Represenative (must comment) Choice offered to / list presented to : [place] / [place]  Discharge Placement                         Discharge Plan and Services Additional resources added to the After Visit Summary for   In-house Referral: Clinical Social Work   Post Acute Care Choice: [place]          DME Arranged: N/A DME Agency: NA                  Social Determinants of Health ([place]) Interventions [place]: No Food Insecurity (10/20/2022)  Housing: Low Risk  (10/20/2022)  Transportation Needs: No Transportation Needs (10/20/2022)  Utilities: Not At Risk (10/20/2022)  Alcohol Screen: Low Risk  (06/14/2018)  Depression (PHQ2-9): Low Risk  (01/27/2022)  Financial Resource Strain: Low Risk  (03/29/2018)  Physical Activity: Insufficiently Active (03/29/2018)  Social Connections: Somewhat Isolated (03/29/2018)  Stress: No Stress Concern Present (03/29/2018)  Tobacco Use: Low Risk  (11/15/2022)     Readmission Risk Interventions    10/24/2022   12:29 PM  Readmission Risk Prevention Plan  Transportation Screening  Complete  PCP or Specialist Appt within 5-7 Days Complete  Home Care Screening Complete  Medication Review (RN CM) Complete

## 2022-11-23 NOTE — TOC Progression Note (Signed)
Transition of Care [facility]) - Progression Note    Patient Details  Name: [name] MRN: [id number] Date of Birth: 1932/09/26  [facility]) CM/SW [place] Phone Number: 11/23/2022, 2:52 PM  Clinical Narrative:     CSW spoke with the patient's nephew- confirmed the plan of the patient returning to [facility]. CSW explained there will be put of cost expense if patient discharges with Hospice. He was agreeable to Palliative following at SNF - she can transition to Hospice when needed. CSW updated [facility] of the plan. She states she will update the patient's niece, that is at bedside. CSW updated SNF and sent d/c summary.  Thurmond Butts, MSW, LCSW Clinical Social Worker     Expected Discharge Plan: Skilled Nursing Facility Barriers to Discharge: Continued Medical Work up, Other (must enter comment) (Pending GOC mtg with Family ?Hospice)  Expected Discharge Plan and Services In-house Referral: Clinical Social Work   Post Acute Care Choice: [place]   Expected Discharge Date: 11/23/22               DME Arranged: N/A DME Agency: NA                   Social Determinants of Health ([place]) Interventions SDOH Screenings   Food Insecurity: No Food Insecurity (10/20/2022)  Housing: Low Risk  (10/20/2022)  Transportation Needs: No Transportation Needs (10/20/2022)  Utilities: Not At Risk (10/20/2022)  Alcohol Screen: Low Risk  (06/14/2018)  Depression (PHQ2-9): Low Risk  (01/27/2022)  Financial Resource Strain: Low Risk  (03/29/2018)  Physical Activity: Insufficiently Active (03/29/2018)  Social Connections: Somewhat Isolated (03/29/2018)  Stress: No Stress Concern Present (03/29/2018)  Tobacco Use: Low Risk  (11/15/2022)    Readmission Risk Interventions    10/24/2022   12:29 PM  Readmission Risk Prevention Plan  Transportation Screening Complete  PCP or Specialist Appt within 5-7 Days Complete  Home Care  Screening Complete  Medication Review (RN CM) Complete

## 2022-11-23 NOTE — Progress Notes (Addendum)
[facility]) Hospital liaison note  Notified by [employer] manager of patient/family request for [facility] Palliative services at home after discharge.  [facility] will follow patient for discharge disposition.  Please call with any hospice or outpatient palliative care related questions.   Thank you for the opportunity to participate in this patient's care.  [place]  [facility] liaison  [phone]

## 2022-11-23 NOTE — Progress Notes (Signed)
   Meeting with family [facility]G [name] and two siblings from [facility]. Discussed hospice services and the transition to comfort and our Hospice facility in [facility]. After discussion of this transition the family questions what it looks like for her to go back to [facility] where she has been would look like with hospice. I explained services at the facility and what this would look like. They would like to pursue her going to SNF with hospice care. We do not have a contract with [facility] so I have contacted [facility] to come speak with them. Webb Silversmith RN [phone]

## 2022-11-23 NOTE — Progress Notes (Signed)
Coordinated meeting between family and Hospice Liaison. Updates provided from TOC-family wishes to discharge to [facility] under Palliative Services.  No Charge.  Theodoro Grist, DNP, AGNP-C Palliative Medicine  Please call Palliative Medicine team phone with any questions [phone]. For individual providers please see AMION.

## 2022-11-23 NOTE — Progress Notes (Signed)
PROGRESS NOTE    [name]  FAO:[id number] DOB: 1932/05/19 DOA: 11/15/2022 PCP: Lauree Chandler, NP    Brief Narrative:  89 year old with history of dementia, chronic diastolic heart failure, CKD stage IIIb, COPD, hypertension and GERD, recently transition to nursing home sent to the ER with hypernatremia.  Patient was not able to give any history.  She has been less interactive since last few days and family requested to be evaluated. Sodium was 160 at the nursing home so sent to ER.   Assessment & Plan:   Hyperchloremic hypernatremia, severe dehydration due to inadequate water intake Acute kidney injury with underlying chronic kidney disease stage IIIb: Baseline creatinine 1.6.  Presented with sodium 163, chloride 129, creatinine 3.1.  Patient with no adequate free water intake. Aggressively treated with IV fluids with improvement of sodium and renal functions to her usual levels. Urine output is adequate.  See goal of care discussion below.  Hypokalemia: Replaced and adequate.   Chronic diastolic heart failure: Discontinue diuretics.  Dementia with behavioral disturbances: Zyprexa 5 mg at night as at nursing home.  Delirium precautions.  Fall precautions.  COPD: Stable.  Goal of care: Multiple goal of care discussion with palliative and hospice.   Recommended inpatient hospice transfer, however family did not agree.   Family agreed to go back to [place] with hospice services.  Transfer when arrangements are made.  Continue supportive care while in the hospital.  DVT prophylaxis: Patient declining heparin.  Discontinued.   Code Status: DNR. Family Communication: None today.  Palliative care and hospice communicating. Disposition Plan: Status is: Inpatient Remains inpatient appropriate because: Pending safe disposition plan.   Consultants:  Palliative care  Procedures:  None  Antimicrobials:  None   Subjective:  Seen and examined.  No overnight  events.  Mostly sleepy.  Responds some basic questions.  Did not eat any food today.  She is not sure why she did not eat.  Objective: Vitals:   11/23/22 0022 11/23/22 0450 11/23/22 0833 11/23/22 1131  BP: 135/69 122/63 (!) 166/96 (!) 137/57  Pulse: 72 66 72 (!) 58  Resp: '16 19 18 20  '$ Temp: 98.5 F (36.9 C) 99 F (37.2 C) 98.8 F (37.1 C)   TempSrc: Oral Axillary Oral Oral  SpO2: 100% 98% 99% 95%  Weight:      Height:        Intake/Output Summary (Last 24 hours) at 11/23/2022 1329 Last data filed at 11/23/2022 1128 Gross per 24 hour  Intake 952.27 ml  Output 1150 ml  Net -197.73 ml     Filed Weights   11/15/22 1933 11/16/22 1950 11/17/22 0850  Weight: 66 kg 50 kg 50 kg    Examination:  General: Frail.  Chronically sick looking.  Debilitated. Cardiovascular: S1-S2 normal. Respiratory: Bilateral clear.  On room air. Gastrointestinal: Soft.  Nontender.  Bowel sound present. Ext: No edema. Neuro: Alert to stimulation but mostly sleepy.  Spontaneously moves extremities.  Pleasant and confused.  Data Reviewed: I have personally reviewed following labs and imaging studies  CBC: No results for input(s): "WBC", "NEUTROABS", "HGB", "HCT", "MCV", "PLT" in the last 168 hours.  Basic Metabolic Panel: Recent Labs  Lab 11/17/22 0359 11/17/22 1653 11/18/22 0653 11/19/22 0109 11/20/22 0048  NA 155* 148* 142 140 139  K 3.5 3.1* 3.3* 3.3* 4.2  CL 123* 116* 111 110 110  CO2 20* 24 22 21* 24  GLUCOSE 75 155* 177* 154* 109*  BUN 56* 47* 39* 30* 25*  CREATININE 2.31* 2.16* 1.93* 1.78* 1.82*  CALCIUM 10.4* 10.2 10.3 10.3 10.3    GFR: Estimated Creatinine Clearance: 14.6 mL/min (A) (by C-G formula based on SCr of 1.82 mg/dL (H)). Liver Function Tests: No results for input(s): "AST", "ALT", "ALKPHOS", "BILITOT", "PROT", "ALBUMIN" in the last 168 hours.  No results for input(s): "LIPASE", "AMYLASE" in the last 168 hours. No results for input(s): "AMMONIA" in the last 168  hours. Coagulation Profile: No results for input(s): "INR", "PROTIME" in the last 168 hours. Cardiac Enzymes: No results for input(s): "CKTOTAL", "CKMB", "CKMBINDEX", "TROPONINI" in the last 168 hours. BNP (last 3 results) No results for input(s): "PROBNP" in the last 8760 hours. HbA1C: No results for input(s): "HGBA1C" in the last 72 hours. CBG: No results for input(s): "GLUCAP" in the last 168 hours.  Lipid Profile: No results for input(s): "CHOL", "HDL", "LDLCALC", "TRIG", "CHOLHDL", "LDLDIRECT" in the last 72 hours. Thyroid Function Tests: No results for input(s): "TSH", "T4TOTAL", "FREET4", "T3FREE", "THYROIDAB" in the last 72 hours.  Anemia Panel: No results for input(s): "VITAMINB12", "FOLATE", "FERRITIN", "TIBC", "IRON", "RETICCTPCT" in the last 72 hours. Sepsis Labs: Recent Labs  Lab 11/16/22 2006  LATICACIDVEN 1.8     No results found for this or any previous visit (from the past 240 hour(s)).       Radiology Studies: No results found.      Scheduled Meds:  mometasone-formoterol  2 puff Inhalation BID   OLANZapine  5 mg Oral QHS   pantoprazole  40 mg Oral Daily   sodium chloride flush  3 mL Intravenous Q12H   Continuous Infusions:     LOS: 8 days    Time spent: 35 minutes     [name] Merino, MD [facility] Pager [phone]

## 2022-11-23 NOTE — Discharge Summary (Signed)
Physician Discharge Summary  [name] LKG:[id number] DOB: 1932/07/10 DOA: 11/15/2022  PCP: Lauree Chandler, NP  Admit date: 11/15/2022 Discharge date: 11/23/2022  Admitted From: Skilled nursing facility Disposition: Skilled nursing facility  Recommendations for Outpatient Follow-up:  Consult palliative care, referral sent to to [facility] care.   Discharge Condition: Fair CODE STATUS: DNR Diet recommendation: Regular diet.  Assist feeding.  Encourage oral intake, encourage plenty of water.  Discharge summary: 89 year old with history of dementia, chronic diastolic heart failure, CKD stage IIIb, COPD, hypertension and GERD, recently transition to SNF sent to the ER with hypernatremia.  Patient was not able to give any history.  She has been less interactive since last few days and family requested to be evaluated. Sodium was 160 at the nursing home so sent to ER. Patient was treated with hypotonic fluid with improvement of serology and normalization of sodium.  Renal functions back to her usual self.  Patient was found to be not eating well and failure to thrive.  Multiple goal of care discussion as below.     Assessment & plan of care:   Hyperchloremic hypernatremia, severe dehydration due to inadequate water intake Acute kidney injury with underlying chronic kidney disease stage IIIb: Baseline creatinine 1.6.   Presented with sodium 163, chloride 129, creatinine 3.1.  Patient with no adequate free water intake. Aggressively treated with IV fluids with improvement of sodium and renal functions to her usual levels. Urine output is adequate.  Very high risk of recurrent hypernatremia and decompensation.   Hypokalemia: Replaced and adequate.    Chronic diastolic heart failure: Discontinue diuretics.  Essential hypertension: Blood pressure stable.  Discontinue all antihypertensives.   Dementia with behavioral disturbances: Zyprexa 5 mg at night as at nursing home.  Delirium  precautions.  Fall precautions.   COPD: Stable.  Bronchodilators as needed.   Goal of care: Multiple goal of care discussion with palliative and hospice.   Recommended inpatient hospice transfer, however family did not agree.   Family agreed to go back to [place] with palliative services and if patient to decompensate further provide hospice care.   Discharge Diagnoses:  Principal Problem:   Hypernatremia Active Problems:   Acute renal failure superimposed on stage 3b chronic kidney disease (HCC)   Hypercalcemia   (HFpEF) heart failure with preserved ejection fraction (HCC)   Essential hypertension   COPD (chronic obstructive pulmonary disease) (HCC)   Dementia with behavioral disturbance (HCC)   Failure to thrive in adult    Discharge Instructions  Discharge Instructions     Amb Referral to Palliative Care   Complete by: As directed    Diet general   Complete by: As directed    Encourage and assist food and water   Discharge instructions   Complete by: As directed    Consult palliative care at SNF   Increase activity slowly   Complete by: As directed    No wound care   Complete by: As directed       Allergies as of 11/23/2022       Reactions   Aspirin Other (See Comments)   REACTION: upset stomach   Codeine Other (See Comments)   REACTION: "MAKES MY BODY GO CRAZY; CRAMPS" Not listed on the [facility]   Latex Nausea And Vomiting        Medication List     STOP taking these medications    amLODipine 10 MG tablet Commonly known as: NORVASC   cefdinir 300 MG capsule Commonly known  as: OMNICEF   cetirizine 10 MG tablet Commonly known as: ZYRTEC   denosumab 60 MG/ML Sosy injection Commonly known as: PROLIA   FeroSul 325 (65 FE) MG tablet Generic drug: ferrous sulfate   hydrALAZINE 50 MG tablet Commonly known as: APRESOLINE   isosorbide mononitrate 30 MG 24 hr tablet Commonly known as: IMDUR   torsemide 20 MG tablet Commonly known as: DEMADEX        TAKE these medications    acetaminophen 650 MG CR tablet Commonly known as: TYLENOL Take 650 mg by mouth every 8 (eight) hours as needed for pain.   Advair HFA 115-21 MCG/ACT inhaler Generic drug: fluticasone-salmeterol INHALE 2 PUFFS BY MOUTH TWICE DAILY What changed:  how much to take how to take this when to take this additional instructions   albuterol 108 (90 Base) MCG/ACT inhaler Commonly known as: ProAir HFA INHALE 2 PUFFS EVERY 4 HOURS AS NEEDED FOR WHEEZING   OLANZapine 5 MG tablet Commonly known as: ZYPREXA TAKE 1 TABLET(5 MG) BY MOUTH AT BEDTIME What changed:  how much to take how to take this when to take this additional instructions   omeprazole 20 MG capsule Commonly known as: PRILOSEC TAKE 1 CAPSULE(20 MG) BY MOUTH DAILY What changed: See the new instructions.        Allergies  Allergen Reactions   Aspirin Other (See Comments)    REACTION: upset stomach   Codeine Other (See Comments)    REACTION: "MAKES MY BODY GO CRAZY; CRAMPS"  Not listed on the [facility]   Latex Nausea And Vomiting    Consultations: Palliative and hospice   Procedures/Studies: No results found. (Echo, Carotid, EGD, Colonoscopy, ERCP)    Subjective: Patient seen in the morning rounds.  Mostly sleepy.  Answers few basic questions on stimulation.  Does not like the food.   Discharge Exam: Vitals:   11/23/22 0833 11/23/22 1131  BP: (!) 166/96 (!) 137/57  Pulse: 72 (!) 58  Resp: 18 20  Temp: 98.8 F (37.1 C)   SpO2: 99% 95%   Vitals:   11/23/22 0022 11/23/22 0450 11/23/22 0833 11/23/22 1131  BP: 135/69 122/63 (!) 166/96 (!) 137/57  Pulse: 72 66 72 (!) 58  Resp: '16 19 18 20  '$ Temp: 98.5 F (36.9 C) 99 F (37.2 C) 98.8 F (37.1 C)   TempSrc: Oral Axillary Oral Oral  SpO2: 100% 98% 99% 95%  Weight:      Height:        General: Frail.  Chronically sick looking.  Debilitated. Cardiovascular: S1-S2 normal. Respiratory: Bilateral clear.  On room  air. Gastrointestinal: Soft.  Nontender.  Bowel sound present. Ext: No edema. Neuro: Alert to stimulation but mostly sleepy.  Spontaneously moves extremities.  Pleasant and confused.    The results of significant diagnostics from this hospitalization (including imaging, microbiology, ancillary and laboratory) are listed below for reference.     Microbiology: No results found for this or any previous visit (from the past 240 hour(s)).   Labs: BNP (last 3 results) Recent Labs    10/19/22 2051  BNP 9,147.8*   Basic Metabolic Panel: Recent Labs  Lab 11/17/22 0359 11/17/22 1653 11/18/22 0653 11/19/22 0109 11/20/22 0048  NA 155* 148* 142 140 139  K 3.5 3.1* 3.3* 3.3* 4.2  CL 123* 116* 111 110 110  CO2 20* 24 22 21* 24  GLUCOSE 75 155* 177* 154* 109*  BUN 56* 47* 39* 30* 25*  CREATININE 2.31* 2.16* 1.93* 1.78* 1.82*  CALCIUM 10.4*  10.2 10.3 10.3 10.3   Liver Function Tests: No results for input(s): "AST", "ALT", "ALKPHOS", "BILITOT", "PROT", "ALBUMIN" in the last 168 hours. No results for input(s): "LIPASE", "AMYLASE" in the last 168 hours. No results for input(s): "AMMONIA" in the last 168 hours. CBC: No results for input(s): "WBC", "NEUTROABS", "HGB", "HCT", "MCV", "PLT" in the last 168 hours. Cardiac Enzymes: No results for input(s): "CKTOTAL", "CKMB", "CKMBINDEX", "TROPONINI" in the last 168 hours. BNP: Invalid input(s): "POCBNP" CBG: No results for input(s): "GLUCAP" in the last 168 hours. D-Dimer No results for input(s): "DDIMER" in the last 72 hours. Hgb A1c No results for input(s): "HGBA1C" in the last 72 hours. Lipid Profile No results for input(s): "CHOL", "HDL", "LDLCALC", "TRIG", "CHOLHDL", "LDLDIRECT" in the last 72 hours. Thyroid function studies No results for input(s): "TSH", "T4TOTAL", "T3FREE", "THYROIDAB" in the last 72 hours.  Invalid input(s): "FREET3" Anemia work up No results for input(s): "VITAMINB12", "FOLATE", "FERRITIN", "TIBC", "IRON",  "RETICCTPCT" in the last 72 hours. Urinalysis    Component Value Date/Time   COLORURINE YELLOW 10/19/2022 2236   APPEARANCEUR CLEAR 10/19/2022 2236   LABSPEC 1.013 10/19/2022 2236   PHURINE 5.0 10/19/2022 2236   GLUCOSEU NEGATIVE 10/19/2022 2236   GLUCOSEU NEGATIVE 05/26/2011 1020   HGBUR NEGATIVE 10/19/2022 2236   BILIRUBINUR NEGATIVE 10/19/2022 2236   KETONESUR NEGATIVE 10/19/2022 2236   PROTEINUR 100 (A) 10/19/2022 2236   UROBILINOGEN 1.0 04/03/2015 1830   NITRITE NEGATIVE 10/19/2022 2236   LEUKOCYTESUR NEGATIVE 10/19/2022 2236   Sepsis Labs No results for input(s): "WBC" in the last 168 hours.  Invalid input(s): "PROCALCITONIN", "LACTICIDVEN" Microbiology No results found for this or any previous visit (from the past 240 hour(s)).   Time coordinating discharge:  35 minutes  SIGNED:   [name] Merino, MD  [facility] 11/23/2022, 2:05 PM

## 2022-11-23 NOTE — Progress Notes (Signed)
PT Cancellation/DC Note  Patient Details Name: [name] MRN: [id number] DOB: [birth year]   Cancelled Treatment:    Reason Eval/Treat Not Completed: Other (comment). Pt now going hospice care. Will sign off.   Shary Decamp [facility] 11/23/2022, 12:14 PM [place] Office [phone]

## 2022-11-23 NOTE — TOC Progression Note (Signed)
Transition of Care [facility]) - Progression Note    Patient Details  Name: [name] MRN: [id number] Date of Birth: 02-24-1932  Transition of Care [facility]) CM/SW [place], [place] Phone Number: 11/23/2022, 11:23 AM  Clinical Narrative:     CSW informed patient's family wants the patient to return to [facility] .   CSW contacted [facility] - SNF confirmed she can return once her son has completed admission paperwork.  TOC will continue to follow and assist with discharge planning.  Thurmond Butts, MSW, LCSW Clinical Social Worker    Expected Discharge Plan: Skilled Nursing Facility Barriers to Discharge: Continued Medical Work up, Other (must enter comment) (Pending GOC mtg with Family ?Hospice)  Expected Discharge Plan and Services In-house Referral: Clinical Social Work   Post Acute Care Choice: [place]                   DME Arranged: N/A DME Agency: NA                   Social Determinants of Health (SDOH) Interventions SDOH Screenings   Food Insecurity: No Food Insecurity (10/20/2022)  Housing: Low Risk  (10/20/2022)  Transportation Needs: No Transportation Needs (10/20/2022)  Utilities: Not At Risk (10/20/2022)  Alcohol Screen: Low Risk  (06/14/2018)  Depression (PHQ2-9): Low Risk  (01/27/2022)  Financial Resource Strain: Low Risk  (03/29/2018)  Physical Activity: Insufficiently Active (03/29/2018)  Social Connections: Somewhat Isolated (03/29/2018)  Stress: No Stress Concern Present (03/29/2018)  Tobacco Use: Low Risk  (11/15/2022)    Readmission Risk Interventions    10/24/2022   12:29 PM  Readmission Risk Prevention Plan  Transportation Screening Complete  PCP or Specialist Appt within 5-7 Days Complete  Home Care Screening Complete  Medication Review (RN CM) Complete

## 2022-11-23 NOTE — Plan of Care (Signed)
DISCHARGE NOTE SNF [name] to be discharged [facility] per MD order. Patient verbalized understanding.  Skin clean, dry and intact without evidence of skin break down, no evidence of skin tears noted. IV catheter discontinued intact. Site without signs and symptoms of complications. Dressing and pressure applied. Pt denies pain at the site currently. No complaints noted.  Patient free of lines, drains, and wounds.   Discharge packet assembled. An After Visit Summary (AVS) was printed and given to the EMS personnel. Patient escorted via stretcher and discharged to [facility] via ambulance.   Stephan Minister, RN

## 2022-11-24 DIAGNOSIS — M79642 Pain in left hand: Secondary | ICD-10-CM | POA: Diagnosis not present

## 2022-11-24 DIAGNOSIS — M25532 Pain in left wrist: Secondary | ICD-10-CM | POA: Diagnosis not present

## 2022-11-27 DIAGNOSIS — I5032 Chronic diastolic (congestive) heart failure: Secondary | ICD-10-CM | POA: Diagnosis not present

## 2022-11-27 DIAGNOSIS — K219 Gastro-esophageal reflux disease without esophagitis: Secondary | ICD-10-CM | POA: Diagnosis not present

## 2022-11-27 DIAGNOSIS — N1832 Chronic kidney disease, stage 3b: Secondary | ICD-10-CM | POA: Diagnosis not present

## 2022-11-27 DIAGNOSIS — F0393 Unspecified dementia, unspecified severity, with mood disturbance: Secondary | ICD-10-CM | POA: Diagnosis not present

## 2022-11-27 DIAGNOSIS — M810 Age-related osteoporosis without current pathological fracture: Secondary | ICD-10-CM | POA: Diagnosis not present

## 2022-11-27 DIAGNOSIS — J449 Chronic obstructive pulmonary disease, unspecified: Secondary | ICD-10-CM | POA: Diagnosis not present

## 2022-11-27 DIAGNOSIS — E870 Hyperosmolality and hypernatremia: Secondary | ICD-10-CM | POA: Diagnosis not present

## 2022-11-27 DIAGNOSIS — E860 Dehydration: Secondary | ICD-10-CM | POA: Diagnosis not present

## 2022-11-27 DIAGNOSIS — E611 Iron deficiency: Secondary | ICD-10-CM | POA: Diagnosis not present

## 2022-11-27 DIAGNOSIS — I130 Hypertensive heart and chronic kidney disease with heart failure and stage 1 through stage 4 chronic kidney disease, or unspecified chronic kidney disease: Secondary | ICD-10-CM | POA: Diagnosis not present

## 2022-11-27 DIAGNOSIS — J309 Allergic rhinitis, unspecified: Secondary | ICD-10-CM | POA: Diagnosis not present

## 2022-11-27 DIAGNOSIS — I5022 Chronic systolic (congestive) heart failure: Secondary | ICD-10-CM | POA: Diagnosis not present

## 2022-11-28 ENCOUNTER — Encounter: Payer: Self-pay | Admitting: Family Medicine

## 2022-11-28 ENCOUNTER — Non-Acute Institutional Stay: Payer: Medicare Other | Admitting: Family Medicine

## 2022-11-28 VITALS — BP 128/72 | HR 66 | Temp 97.3°F | Resp 18 | Wt 110.0 lb

## 2022-11-28 DIAGNOSIS — N179 Acute kidney failure, unspecified: Secondary | ICD-10-CM

## 2022-11-28 DIAGNOSIS — F03918 Unspecified dementia, unspecified severity, with other behavioral disturbance: Secondary | ICD-10-CM

## 2022-11-28 DIAGNOSIS — Z515 Encounter for palliative care: Secondary | ICD-10-CM | POA: Diagnosis not present

## 2022-11-28 DIAGNOSIS — E43 Unspecified severe protein-calorie malnutrition: Secondary | ICD-10-CM | POA: Diagnosis not present

## 2022-11-28 DIAGNOSIS — N1832 Chronic kidney disease, stage 3b: Secondary | ICD-10-CM | POA: Diagnosis not present

## 2022-11-28 NOTE — Progress Notes (Signed)
[facility] Palliative Care Consult Note Telephone: [phone]  Fax: [phone]   Date of encounter: 11/28/22 9:36 AM PATIENT NAME: [name] [place] [address]   [id number] (home)  DOB: [birth year] MRN: [id number] PRIMARY CARE PROVIDER:    Lauree Chandler, NP,  [place] [address] [phone]  REFERRING PROVIDER:   Lauree Chandler, NP [place],  [place] [address] [phone]  RESPONSIBLE PARTY:    Contact Information     Name Relation Home Work Mobile   [place]   [phone]   [place] Niece [phone]          I met face to face with patient and family in [facility]. Palliative Care was asked to follow this patient by consultation request of  Lauree Chandler, NP to address advance care planning and complex medical decision making. This is the initial visit.          ASSESSMENT, SYMPTOM MANAGEMENT AND PLAN / RECOMMENDATIONS:  Dementia with behavioral disturbance FAST 7 score 7D Failure to thrive and functional decline in communication and mobility to bedbound status  Acute renal failure superimposed on stage 3B CKD Cr baseline 1.3-1.5 prior to December, peaked at 3.1 on 11/15/22 with severe hypernatremia of 163 and eGFR 14. At d/c Cr improved to 1.82 with Egfr 26 worsened by dehydration with poor fluid intake.  Severe protein calorie malnutrition Prealbumin down to 9 on 10/19/22, albumin  11/16/22 low at 2.6. Poor intake of food and fluids with 35 lbs weight loss since October 2023 Recent aspiration pneumonia  Palliative Care Encounter Discussed goals of care with legal guardian who is interested in comfort care, wants Hospice services at current SNF  Orders obtained from [facility] NP for Hospice Referral. Follow up 1-2 weeks if not admitted to Hospice services   Follow up Palliative Care Visit:  Palliative care will continue to follow for complex medical decision making, advance care planning, and clarification of goals. Return 2-3 weeks or prn if not on Hospice Service.    This visit was coded based on medical decision making (MDM).  PPS: 20%  HOSPICE ELIGIBILITY/DIAGNOSIS: TBD  Chief Complaint:  [facility] received a referral to follow up with patient for chronic disease management in setting of dementia and failure to thrive.  Palliative Care also following to assist with refining goals of care.  HISTORY OF PRESENT ILLNESS:  [name] is a 89 y.o. year old female with recent hospitalization times 2.  She was inpatient 10/20/22 for CAP with 1 of 2 positive blood cultures showing Staph epidermidis thought to be contaminant.  She was readmitted in January with hypernatremia due to severe dehydration.  She has dementia with paranoid delusion, HTN, peripheral venous insufficiency/PVD, chronic combined systolic and diastolic heart failure, asthma with chronic obstructive bronchitis, osteoporosis, CKD, MGUS, lymphedema, chronic multi-joint pain, Hypernatremia, Hypercalcemia and failure to thrive. Pt noted dozing in bed, arouses to having name called.  She answers "yes" to all questions except when NP attempts to offer her food and says "No".  She has eaten what looks like only bites from her tray. Pt was seen and evaluated by PT/OT while inpatient in January.  She had been seen in December and was able to ambulate some.  On exam in January she was total assist to sit up in bed, noted to lean backwards and had poor sitting balance on  edge of bed.  PT indicated she had very poor balance and was max assist x 2 to stand or ambulate a few steps and balance was very poor. Seen by Palliative Service while inpatient, initial plan was to d/c to Hospice inpatient at [facility].  Just prior to d/c nephew and niece for patient indicated they wanted her to return  to [facility] with Palliative. Spoke with Lovina Reach, NP for [facility] and PCP for pt there.  Advised of current findings of weight loss, decline in function and nephew's wishes for his aunt to be comfortable.  She is in agreement that current presentation is indicative of a prognosis of 6 months or less on the current disease course.  She was agreeable to referral for Hospice and asked that she remain as attending.    History obtained from review of EMR, discussion with primary team, and interview with nephew [name], facility staff and/or Ms. Goodwyn.      Latest Ref Rng & Units 11/16/2022    5:20 AM 11/15/2022    9:38 PM 10/20/2022   10:40 AM  CBC  WBC 4.0 - 10.5 K/uL 6.2  7.3  7.4   Hemoglobin 12.0 - 15.0 g/dL 10.9  12.4  9.1   Hematocrit 36.0 - 46.0 % 38.1  45.1  30.9   Platelets 150 - 400 K/uL 221  257  218       Latest Ref Rng & Units 11/20/2022   12:48 AM 11/19/2022    1:09 AM 11/18/2022    6:53 AM  CMP  Glucose 70 - 99 mg/dL 109  154  177   BUN 8 - 23 mg/dL 25  30  39   Creatinine 0.44 - 1.00 mg/dL 1.82  1.78  1.93   Sodium 135 - 145 mmol/L 139  140  142   Potassium 3.5 - 5.1 mmol/L 4.2  3.3  3.3   Chloride 98 - 111 mmol/L 110  110  111   CO2 22 - 32 mmol/L '24  21  22   '$ Calcium 8.9 - 10.3 mg/dL 10.3  10.3  10.3       Latest Ref Rng & Units 11/16/2022    5:20 AM 11/15/2022    9:38 PM 10/20/2022   10:40 AM  Hepatic Function  Total Protein 6.5 - 8.1 g/dL 7.4  8.8  8.0   Albumin 3.5 - 5.0 g/dL 2.6  3.0  2.8   AST 15 - 41 U/L '23  23  20   '$ ALT 0 - 44 U/L '10  12  13   '$ Alk Phosphatase 38 - 126 U/L 61  69  57   Total Bilirubin 0.3 - 1.2 mg/dL 0.6  0.8  0.7   11/15/22 TSH low normal at 0.746 10/20/22 Phosphorous elevated at 5.1 10/19/22 Respiratory virus panel negative for Influenza A & B, RSV and SARS-CoV-2. 10/19/22 Prealbumin low at 9. 10/19/22-10/20/22 Elevated troponin 140-141 but flat, BNP elevated at 1,976.4  10/20/22 2d echo: 1. Left ventricular EF is 60 to 65%.  LV with normal function and no regional wall motion abnormalities.  Mild left ventricular hypertrophy.  Grade II diastolic dysfunction   2. Right ventricular systolic function is mildly reduced and size is mildly enlarged. There is moderately elevated pulmonary artery systolic pressure with estimated RVSP of 50.8 mm HG.   3. Left atrial size was moderately dilated.   4. No evidence of cardiac tamponade.   5. No evidence of mitral valve regurgitation.  6. Mild calcification of the aortic valve with trivial regurgitation, noted sclerosis/calcification without stenosis.   7. The inferior vena cava is dilated in size with <50% respiratory variability, suggesting right atrial pressure of 15 mmHg.   10/19/22 CT head wo contrast: IMPRESSION: 1. Motion degraded exam. No definite CT evidence for acute intracranial abnormality. 2. Atrophy and chronic small vessel ischemic changes of the white matter.  10/19/22 CXR: FINDINGS: Mild retrocardiac opacity, suspicious for pneumonia.   Cardiomegaly with pulmonary vascular congestion. No frank interstitial edema. No pleural effusion or pneumothorax.   IMPRESSION: Mild retrocardiac opacity, suspicious for pneumonia.  I reviewed EMR for available labs, medications, imaging, studies and related documents.  No new records since last visit/Records reviewed and summarized above.   ROS Unable to obtain from pt due to advanced dementia  Physical Exam: Current and past weights: weight 09/15/22 143 lbs per PCP office, current weight 110 lbs as of 11/27/22 Constitutional: NAD General: frail appearing, thin and chronically ill ENMT: intact hearing CV: S1S2, RRR without MRG, trace LLE edema Pulmonary: CTAB, no increased work of breathing, no cough, room air Abdomen: unable to auscultate BS, soft and non tender, no ascites GU: deferred MSK: no sarcopenia, moves all extremities, bedbound Skin: warm and dry, extremely dry skin over BLE with sheets of  flaking/peeling skin Neuro:  noted generalized weakness, significant cognitive impairment Psych: non-anxious affect, dozing, arousable and O to self only Hem/lymph/immuno: no widespread bruising  CURRENT PROBLEM LIST:  Patient Active Problem List   Diagnosis Date Noted   Failure to thrive in adult 11/23/2022   Acute renal failure superimposed on stage 3b chronic kidney disease ([place]) 11/16/2022   (HFpEF) heart failure with preserved ejection fraction ([place]) 11/16/2022   Hypercalcemia 11/16/2022   Hypernatremia 11/15/2022   CAP (community acquired pneumonia) 10/19/2022   COPD (chronic obstructive pulmonary disease) ([place]) 10/19/2022   Acute on chronic combined systolic (congestive) and diastolic (congestive) heart failure ([place]) 10/19/2022   Dementia with behavioral disturbance ([place]) 10/19/2022   Normocytic anemia 32/67/1245   Acute metabolic encephalopathy 80/99/8338   Elevated troponin 10/19/2022   PVD (peripheral vascular disease) ([place]) 04/27/2021   Medication noncompliance due to cognitive impairment 10/17/2019   Chronic pain of multiple joints 08/01/2017   Age-related osteoporosis without current pathological fracture 08/01/2017   Left knee pain 09/19/2016   Rectal benign neoplasm    Asthma, chronic 04/04/2015   history of Tubular adenoma of colon 04/04/2015   CKD (chronic kidney disease) stage 3, GFR 30-59 ml/min (HCC) 04/03/2015   Edema 25/02/3975   Chronic systolic heart failure ([place]) 11/20/2013   Lymphedema of lower extremity 10/17/2013   Psychotic paranoia ([place]) 04/04/2013   MGUS (monoclonal gammopathy of unknown significance) 05/15/2012   Vitamin D deficiency 01/29/2010   DERMATITIS 09/30/2009   Paranoid delusion ([place]) 08/02/2008   Obstructive chronic bronchitis ([place]) 08/02/2008   Essential hypertension 11/11/2007   Venous (peripheral) insufficiency 11/11/2007   RENAL CALCULUS 11/11/2007   LOW BACK PAIN, CHRONIC 11/11/2007   PAST MEDICAL HISTORY:  Active Ambulatory  Problems    Diagnosis Date Noted   Vitamin D deficiency 01/29/2010   Paranoid delusion ([place]) 08/02/2008   Essential hypertension 11/11/2007   Venous (peripheral) insufficiency 11/11/2007   Obstructive chronic bronchitis ([place]) 08/02/2008   RENAL CALCULUS 11/11/2007   DERMATITIS 09/30/2009   LOW BACK PAIN, CHRONIC 11/11/2007   MGUS (monoclonal gammopathy of unknown significance) 05/15/2012   Psychotic paranoia ([place]) 04/04/2013   Lymphedema of lower extremity 73/41/9379   Chronic systolic heart  failure ([place]) 11/20/2013   Edema 04/14/2014   CKD (chronic kidney disease) stage 3, GFR 30-59 ml/min (HCC) 04/03/2015   Asthma, chronic 04/04/2015   history of Tubular adenoma of colon 04/04/2015   Rectal benign neoplasm    Left knee pain 09/19/2016   Chronic pain of multiple joints 08/01/2017   Age-related osteoporosis without current pathological fracture 08/01/2017   Medication noncompliance due to cognitive impairment 10/17/2019   PVD (peripheral vascular disease) ([place]) 04/27/2021   CAP (community acquired pneumonia) 10/19/2022   COPD (chronic obstructive pulmonary disease) ([place]) 10/19/2022   Acute on chronic combined systolic (congestive) and diastolic (congestive) heart failure ([place]) 10/19/2022   Dementia with behavioral disturbance ([place]) 10/19/2022   Normocytic anemia 54/65/0354   Acute metabolic encephalopathy 65/68/1275   Elevated troponin 10/19/2022   Hypernatremia 11/15/2022   Acute renal failure superimposed on stage 3b chronic kidney disease ([place]) 11/16/2022   (HFpEF) heart failure with preserved ejection fraction ([place]) 11/16/2022   Hypercalcemia 11/16/2022   Failure to thrive in adult 11/23/2022   Resolved Ambulatory Problems    Diagnosis Date Noted   BURSITIS, LEFT ELBOW 01/29/2010   DIABETES MELLITUS, BORDERLINE 01/11/2009   Nausea with vomiting 11/13/2013   Iron deficiency anemia secondary to inadequate dietary iron intake 11/13/2013   History of colonic polyps  12/08/2014   Cellulitis 04/03/2015   Dehydration 04/03/2015   Skin ulcer ([place])    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   Hypokalemia 04/04/2015   Headache 06/22/2015   Refractory anemia, unspecified ([place]) 08/10/2016   Past Medical History:  Diagnosis Date   Anemia, unspecified    Anxiety    Arthritis    Asthma    Delusions (HCC)    Depression    GERD (gastroesophageal reflux disease)    Lymphedema    Obstructive chronic bronchitis without exacerbation    Systolic heart failure (HCC)    Unspecified essential hypertension    Unspecified nonpsychotic mental disorder    Unspecified venous (peripheral) insufficiency    Unspecified venous (peripheral) insufficiency    Unspecified vitamin D deficiency    SOCIAL HX:  Social History   Tobacco Use   Smoking status: Never   Smokeless tobacco: Never  Substance Use Topics   Alcohol use: No   FAMILY HX:  Family History  Problem Relation Age of Onset   Diabetes Mother    Arthritis Father    Diabetes Paternal Grandfather        entire family on both sides   Heart disease Maternal Aunt        entire family of both sides   Cancer Paternal Aunt        type unknown   Diabetes Sister    Breast cancer Cousin        Preferred Pharmacy: ALLERGIES:  Allergies  Allergen Reactions   Aspirin Other (See Comments)    REACTION: upset stomach   Codeine Other (See Comments)    REACTION: "MAKES MY BODY GO CRAZY; CRAMPS"  Not listed on the [facility]   Latex Nausea And Vomiting     PERTINENT MEDICATIONS:  Outpatient Encounter Medications as of 11/28/2022  Medication Sig   acetaminophen (TYLENOL) 650 MG CR tablet Take 650 mg by mouth every 8 (eight) hours as needed for pain.   albuterol (PROAIR HFA) 108 (90 Base) MCG/ACT inhaler INHALE 2 PUFFS EVERY 4 HOURS AS NEEDED FOR WHEEZING   fluticasone-salmeterol (ADVAIR HFA) 115-21 MCG/ACT inhaler INHALE 2 PUFFS BY MOUTH TWICE DAILY (Patient taking differently: Inhale 2 puffs  into the lungs 2  (two) times daily.)   OLANZapine (ZYPREXA) 5 MG tablet TAKE 1 TABLET(5 MG) BY MOUTH AT BEDTIME (Patient taking differently: Take 5 mg by mouth at bedtime.)   omeprazole (PRILOSEC) 20 MG capsule TAKE 1 CAPSULE(20 MG) BY MOUTH DAILY (Patient taking differently: Take 20 mg by mouth daily.)   No facility-administered encounter medications on file as of 11/28/2022.     -------------------------------------------------------------------- Advance Care Planning/Goals of Care: Goals include to maximize quality of life and symptom management. Health care surrogate and legal guardian, Alan [place] gave his permission to discuss.Our advance care planning conversation included a discussion about:    The value and importance of advance care planning-Pt has a living will on file Experiences with loved ones who have been seriously ill or have died  Exploration of personal, cultural or spiritual beliefs that might influence medical decisions-nephew indicates that he was attempting to give his Aunt a chance to see if she would improve but he had questions about whether or not pt would "forget how to eat" which he had been told at the hospital.  He wanted to know if it mattered who was feeding the patient as to whether or not she ate.  He also indicated that she no longer carries on conversations and will sometimes answer yes or no questions.  Advised on normal progression of dementia to more bedbound status with difficulty getting OOB, forgetting that one is eating or chewing and pocketing food which when they relax can lead to aspiration and that normal end of life process may include acute agitation/confusion imposed on dementia and would stop eating and drinking.  When asked, he states pt does not eat better for him.  Advised I view this more as a progression of her illness.  He states when she went to the hospital in December she wound up "spitting out food and medications that had been in her mouth for at least a day  and she had aspiration pneumonia. Informed of FAST 7 Stages of dementia with the 7th level. Advised FAST 7 score 7D.  Also informed of the factors which compose the PPS score and advised of change in function including bedbound status max assist per hospital PT prior to d/c with bed mobility, poor sitting balance and had to be propped up. Nephew verbalized understanding of current situation and desire to keep her comfortable. Originally he thought he might take her to [facility] to their inpatient unit but he wants her to remain at [facility] in SNF and is open to Hospice services at [facility]. Exploration of goals of care in the event of a sudden injury or illness  Identification of a healthcare agent-legal guardian is nephew Alan [place] [phone], niece Lanora Manis is listed as an emergency contact Review of an advance directive document-MOST is outdated and per nephew he initiated the DNR . Decision not to resuscitate or to de-escalate disease focused treatments due to poor prognosis. CODE STATUS: MOST dated 10/25/22 shows full scope of treatment but does not specify tube feedings. (VOIDED MOST)  Has DNR dated 11/20/2022.  Time:  10: 41 am time spent: 21:38   Thank you for the opportunity to participate in the care of Ms. Rafanan.  The palliative care team will continue to follow. Please call our office at [phone] if we can be of additional assistance.   Marijo Conception, FNP-C  COVID-19 PATIENT SCREENING TOOL Asked and negative response unless otherwise noted:  Have you had symptoms  of covid, tested positive or been in contact with someone with symptoms/positive test in the past 5-10 days? Unknown, recent hospitalization

## 2022-12-01 DIAGNOSIS — J449 Chronic obstructive pulmonary disease, unspecified: Secondary | ICD-10-CM | POA: Diagnosis not present

## 2022-12-01 DIAGNOSIS — F0393 Unspecified dementia, unspecified severity, with mood disturbance: Secondary | ICD-10-CM | POA: Diagnosis not present

## 2022-12-01 DIAGNOSIS — I130 Hypertensive heart and chronic kidney disease with heart failure and stage 1 through stage 4 chronic kidney disease, or unspecified chronic kidney disease: Secondary | ICD-10-CM | POA: Diagnosis not present

## 2022-12-01 DIAGNOSIS — I5022 Chronic systolic (congestive) heart failure: Secondary | ICD-10-CM | POA: Diagnosis not present

## 2022-12-03 DIAGNOSIS — F0393 Unspecified dementia, unspecified severity, with mood disturbance: Secondary | ICD-10-CM | POA: Diagnosis not present

## 2022-12-03 DIAGNOSIS — I130 Hypertensive heart and chronic kidney disease with heart failure and stage 1 through stage 4 chronic kidney disease, or unspecified chronic kidney disease: Secondary | ICD-10-CM | POA: Diagnosis not present

## 2022-12-03 DIAGNOSIS — I5032 Chronic diastolic (congestive) heart failure: Secondary | ICD-10-CM | POA: Diagnosis not present

## 2022-12-03 DIAGNOSIS — G9340 Encephalopathy, unspecified: Secondary | ICD-10-CM | POA: Diagnosis not present

## 2022-12-03 DIAGNOSIS — I5022 Chronic systolic (congestive) heart failure: Secondary | ICD-10-CM | POA: Diagnosis not present

## 2022-12-03 DIAGNOSIS — K219 Gastro-esophageal reflux disease without esophagitis: Secondary | ICD-10-CM | POA: Diagnosis not present

## 2022-12-03 DIAGNOSIS — J449 Chronic obstructive pulmonary disease, unspecified: Secondary | ICD-10-CM | POA: Diagnosis not present

## 2022-12-06 DIAGNOSIS — N1832 Chronic kidney disease, stage 3b: Secondary | ICD-10-CM | POA: Diagnosis not present

## 2022-12-06 DIAGNOSIS — I130 Hypertensive heart and chronic kidney disease with heart failure and stage 1 through stage 4 chronic kidney disease, or unspecified chronic kidney disease: Secondary | ICD-10-CM | POA: Diagnosis not present

## 2022-12-06 DIAGNOSIS — R634 Abnormal weight loss: Secondary | ICD-10-CM | POA: Diagnosis not present

## 2022-12-06 DIAGNOSIS — I509 Heart failure, unspecified: Secondary | ICD-10-CM | POA: Diagnosis not present

## 2022-12-06 DIAGNOSIS — J449 Chronic obstructive pulmonary disease, unspecified: Secondary | ICD-10-CM | POA: Diagnosis not present

## 2022-12-06 DIAGNOSIS — K219 Gastro-esophageal reflux disease without esophagitis: Secondary | ICD-10-CM | POA: Diagnosis not present

## 2022-12-06 DIAGNOSIS — G311 Senile degeneration of brain, not elsewhere classified: Secondary | ICD-10-CM | POA: Diagnosis not present

## 2022-12-06 DIAGNOSIS — F0283 Dementia in other diseases classified elsewhere, unspecified severity, with mood disturbance: Secondary | ICD-10-CM | POA: Diagnosis not present

## 2022-12-06 DIAGNOSIS — M545 Low back pain, unspecified: Secondary | ICD-10-CM | POA: Diagnosis not present

## 2022-12-06 DIAGNOSIS — J690 Pneumonitis due to inhalation of food and vomit: Secondary | ICD-10-CM | POA: Diagnosis not present

## 2022-12-06 DIAGNOSIS — I739 Peripheral vascular disease, unspecified: Secondary | ICD-10-CM | POA: Diagnosis not present

## 2022-12-06 DIAGNOSIS — D631 Anemia in chronic kidney disease: Secondary | ICD-10-CM | POA: Diagnosis not present

## 2022-12-07 DIAGNOSIS — F0283 Dementia in other diseases classified elsewhere, unspecified severity, with mood disturbance: Secondary | ICD-10-CM | POA: Diagnosis not present

## 2022-12-07 DIAGNOSIS — J449 Chronic obstructive pulmonary disease, unspecified: Secondary | ICD-10-CM | POA: Diagnosis not present

## 2022-12-07 DIAGNOSIS — K219 Gastro-esophageal reflux disease without esophagitis: Secondary | ICD-10-CM | POA: Diagnosis not present

## 2022-12-07 DIAGNOSIS — I130 Hypertensive heart and chronic kidney disease with heart failure and stage 1 through stage 4 chronic kidney disease, or unspecified chronic kidney disease: Secondary | ICD-10-CM | POA: Diagnosis not present

## 2022-12-07 DIAGNOSIS — R634 Abnormal weight loss: Secondary | ICD-10-CM | POA: Diagnosis not present

## 2022-12-07 DIAGNOSIS — J690 Pneumonitis due to inhalation of food and vomit: Secondary | ICD-10-CM | POA: Diagnosis not present

## 2022-12-07 DIAGNOSIS — I5022 Chronic systolic (congestive) heart failure: Secondary | ICD-10-CM | POA: Diagnosis not present

## 2022-12-07 DIAGNOSIS — F0393 Unspecified dementia, unspecified severity, with mood disturbance: Secondary | ICD-10-CM | POA: Diagnosis not present

## 2022-12-07 DIAGNOSIS — G311 Senile degeneration of brain, not elsewhere classified: Secondary | ICD-10-CM | POA: Diagnosis not present

## 2022-12-08 DIAGNOSIS — J690 Pneumonitis due to inhalation of food and vomit: Secondary | ICD-10-CM | POA: Diagnosis not present

## 2022-12-08 DIAGNOSIS — G311 Senile degeneration of brain, not elsewhere classified: Secondary | ICD-10-CM | POA: Diagnosis not present

## 2022-12-08 DIAGNOSIS — R634 Abnormal weight loss: Secondary | ICD-10-CM | POA: Diagnosis not present

## 2022-12-08 DIAGNOSIS — F0283 Dementia in other diseases classified elsewhere, unspecified severity, with mood disturbance: Secondary | ICD-10-CM | POA: Diagnosis not present

## 2022-12-08 DIAGNOSIS — K219 Gastro-esophageal reflux disease without esophagitis: Secondary | ICD-10-CM | POA: Diagnosis not present

## 2022-12-08 DIAGNOSIS — I130 Hypertensive heart and chronic kidney disease with heart failure and stage 1 through stage 4 chronic kidney disease, or unspecified chronic kidney disease: Secondary | ICD-10-CM | POA: Diagnosis not present

## 2022-12-09 DIAGNOSIS — J690 Pneumonitis due to inhalation of food and vomit: Secondary | ICD-10-CM | POA: Diagnosis not present

## 2022-12-09 DIAGNOSIS — K219 Gastro-esophageal reflux disease without esophagitis: Secondary | ICD-10-CM | POA: Diagnosis not present

## 2022-12-09 DIAGNOSIS — G311 Senile degeneration of brain, not elsewhere classified: Secondary | ICD-10-CM | POA: Diagnosis not present

## 2022-12-09 DIAGNOSIS — I130 Hypertensive heart and chronic kidney disease with heart failure and stage 1 through stage 4 chronic kidney disease, or unspecified chronic kidney disease: Secondary | ICD-10-CM | POA: Diagnosis not present

## 2022-12-09 DIAGNOSIS — F0283 Dementia in other diseases classified elsewhere, unspecified severity, with mood disturbance: Secondary | ICD-10-CM | POA: Diagnosis not present

## 2022-12-09 DIAGNOSIS — R634 Abnormal weight loss: Secondary | ICD-10-CM | POA: Diagnosis not present

## 2022-12-10 DIAGNOSIS — J690 Pneumonitis due to inhalation of food and vomit: Secondary | ICD-10-CM | POA: Diagnosis not present

## 2022-12-10 DIAGNOSIS — G311 Senile degeneration of brain, not elsewhere classified: Secondary | ICD-10-CM | POA: Diagnosis not present

## 2022-12-10 DIAGNOSIS — K219 Gastro-esophageal reflux disease without esophagitis: Secondary | ICD-10-CM | POA: Diagnosis not present

## 2022-12-10 DIAGNOSIS — I130 Hypertensive heart and chronic kidney disease with heart failure and stage 1 through stage 4 chronic kidney disease, or unspecified chronic kidney disease: Secondary | ICD-10-CM | POA: Diagnosis not present

## 2022-12-10 DIAGNOSIS — F0283 Dementia in other diseases classified elsewhere, unspecified severity, with mood disturbance: Secondary | ICD-10-CM | POA: Diagnosis not present

## 2022-12-10 DIAGNOSIS — R634 Abnormal weight loss: Secondary | ICD-10-CM | POA: Diagnosis not present

## 2022-12-11 DIAGNOSIS — J690 Pneumonitis due to inhalation of food and vomit: Secondary | ICD-10-CM | POA: Diagnosis not present

## 2022-12-11 DIAGNOSIS — R634 Abnormal weight loss: Secondary | ICD-10-CM | POA: Diagnosis not present

## 2022-12-11 DIAGNOSIS — K219 Gastro-esophageal reflux disease without esophagitis: Secondary | ICD-10-CM | POA: Diagnosis not present

## 2022-12-11 DIAGNOSIS — I130 Hypertensive heart and chronic kidney disease with heart failure and stage 1 through stage 4 chronic kidney disease, or unspecified chronic kidney disease: Secondary | ICD-10-CM | POA: Diagnosis not present

## 2022-12-11 DIAGNOSIS — G311 Senile degeneration of brain, not elsewhere classified: Secondary | ICD-10-CM | POA: Diagnosis not present

## 2022-12-11 DIAGNOSIS — F0283 Dementia in other diseases classified elsewhere, unspecified severity, with mood disturbance: Secondary | ICD-10-CM | POA: Diagnosis not present

## 2022-12-12 DIAGNOSIS — K219 Gastro-esophageal reflux disease without esophagitis: Secondary | ICD-10-CM | POA: Diagnosis not present

## 2022-12-12 DIAGNOSIS — R634 Abnormal weight loss: Secondary | ICD-10-CM | POA: Diagnosis not present

## 2022-12-12 DIAGNOSIS — F0283 Dementia in other diseases classified elsewhere, unspecified severity, with mood disturbance: Secondary | ICD-10-CM | POA: Diagnosis not present

## 2022-12-12 DIAGNOSIS — G311 Senile degeneration of brain, not elsewhere classified: Secondary | ICD-10-CM | POA: Diagnosis not present

## 2022-12-12 DIAGNOSIS — I130 Hypertensive heart and chronic kidney disease with heart failure and stage 1 through stage 4 chronic kidney disease, or unspecified chronic kidney disease: Secondary | ICD-10-CM | POA: Diagnosis not present

## 2022-12-12 DIAGNOSIS — J690 Pneumonitis due to inhalation of food and vomit: Secondary | ICD-10-CM | POA: Diagnosis not present

## 2022-12-13 DIAGNOSIS — J690 Pneumonitis due to inhalation of food and vomit: Secondary | ICD-10-CM | POA: Diagnosis not present

## 2022-12-13 DIAGNOSIS — K219 Gastro-esophageal reflux disease without esophagitis: Secondary | ICD-10-CM | POA: Diagnosis not present

## 2022-12-13 DIAGNOSIS — F0283 Dementia in other diseases classified elsewhere, unspecified severity, with mood disturbance: Secondary | ICD-10-CM | POA: Diagnosis not present

## 2022-12-13 DIAGNOSIS — I130 Hypertensive heart and chronic kidney disease with heart failure and stage 1 through stage 4 chronic kidney disease, or unspecified chronic kidney disease: Secondary | ICD-10-CM | POA: Diagnosis not present

## 2022-12-13 DIAGNOSIS — G311 Senile degeneration of brain, not elsewhere classified: Secondary | ICD-10-CM | POA: Diagnosis not present

## 2022-12-13 DIAGNOSIS — R634 Abnormal weight loss: Secondary | ICD-10-CM | POA: Diagnosis not present

## 2022-12-14 DIAGNOSIS — F0283 Dementia in other diseases classified elsewhere, unspecified severity, with mood disturbance: Secondary | ICD-10-CM | POA: Diagnosis not present

## 2022-12-14 DIAGNOSIS — G311 Senile degeneration of brain, not elsewhere classified: Secondary | ICD-10-CM | POA: Diagnosis not present

## 2022-12-14 DIAGNOSIS — R634 Abnormal weight loss: Secondary | ICD-10-CM | POA: Diagnosis not present

## 2022-12-14 DIAGNOSIS — K219 Gastro-esophageal reflux disease without esophagitis: Secondary | ICD-10-CM | POA: Diagnosis not present

## 2022-12-14 DIAGNOSIS — I130 Hypertensive heart and chronic kidney disease with heart failure and stage 1 through stage 4 chronic kidney disease, or unspecified chronic kidney disease: Secondary | ICD-10-CM | POA: Diagnosis not present

## 2022-12-14 DIAGNOSIS — J690 Pneumonitis due to inhalation of food and vomit: Secondary | ICD-10-CM | POA: Diagnosis not present

## 2022-12-25 ENCOUNTER — Encounter: Payer: Medicare Other | Admitting: Occupational Therapy

## 2022-12-29 DEATH — deceased

## 2023-01-22 ENCOUNTER — Encounter: Payer: Medicare Other | Admitting: Occupational Therapy

## 2023-02-22 ENCOUNTER — Encounter: Payer: Medicare Other | Admitting: Occupational Therapy

## 2023-03-21 ENCOUNTER — Encounter: Payer: Medicare Other | Admitting: Occupational Therapy
